# Patient Record
Sex: Male | Born: 1945 | Race: White | Hispanic: No | Marital: Single | Smoking: Former smoker
Health system: Southern US, Community
[De-identification: ages and names within clinical notes are randomized; demographics above are authoritative.]

## PROBLEM LIST (undated history)

## (undated) DIAGNOSIS — G629 Polyneuropathy, unspecified: Secondary | ICD-10-CM

## (undated) DIAGNOSIS — F329 Major depressive disorder, single episode, unspecified: Secondary | ICD-10-CM

## (undated) DIAGNOSIS — R2689 Other abnormalities of gait and mobility: Secondary | ICD-10-CM

## (undated) DIAGNOSIS — G8929 Other chronic pain: Secondary | ICD-10-CM

## (undated) DIAGNOSIS — M1990 Unspecified osteoarthritis, unspecified site: Secondary | ICD-10-CM

## (undated) DIAGNOSIS — I509 Heart failure, unspecified: Secondary | ICD-10-CM

## (undated) DIAGNOSIS — M545 Low back pain, unspecified: Secondary | ICD-10-CM

## (undated) DIAGNOSIS — T4145XA Adverse effect of unspecified anesthetic, initial encounter: Secondary | ICD-10-CM

## (undated) DIAGNOSIS — T8859XA Other complications of anesthesia, initial encounter: Secondary | ICD-10-CM

## (undated) DIAGNOSIS — I6529 Occlusion and stenosis of unspecified carotid artery: Secondary | ICD-10-CM

## (undated) DIAGNOSIS — F32A Depression, unspecified: Secondary | ICD-10-CM

## (undated) DIAGNOSIS — J449 Chronic obstructive pulmonary disease, unspecified: Secondary | ICD-10-CM

## (undated) DIAGNOSIS — M542 Cervicalgia: Secondary | ICD-10-CM

## (undated) DIAGNOSIS — M549 Dorsalgia, unspecified: Secondary | ICD-10-CM

## (undated) DIAGNOSIS — I10 Essential (primary) hypertension: Secondary | ICD-10-CM

## (undated) HISTORY — PX: NECK SURGERY: SHX720

## (undated) HISTORY — DX: Low back pain: M54.5

## (undated) HISTORY — PX: PILONIDAL CYST / SINUS EXCISION: SUR543

## (undated) HISTORY — PX: CARPAL TUNNEL RELEASE: SHX101

## (undated) HISTORY — PX: ABDOMINAL AORTIC ANEURYSM REPAIR: SUR1152

## (undated) HISTORY — DX: Heart failure, unspecified: I50.9

## (undated) HISTORY — PX: OTHER SURGICAL HISTORY: SHX169

## (undated) HISTORY — DX: Low back pain, unspecified: M54.50

## (undated) HISTORY — DX: Major depressive disorder, single episode, unspecified: F32.9

## (undated) HISTORY — DX: Depression, unspecified: F32.A

## (undated) HISTORY — DX: Unspecified osteoarthritis, unspecified site: M19.90

## (undated) HISTORY — DX: Other abnormalities of gait and mobility: R26.89

## (undated) HISTORY — DX: Chronic obstructive pulmonary disease, unspecified: J44.9

## (undated) HISTORY — DX: Essential (primary) hypertension: I10

## (undated) HISTORY — PX: TONSILLECTOMY: SUR1361

## (undated) HISTORY — DX: Occlusion and stenosis of unspecified carotid artery: I65.29

---

## 2001-05-11 ENCOUNTER — Encounter: Admission: RE | Admit: 2001-05-11 | Discharge: 2001-05-11 | Payer: Self-pay | Admitting: Neurosurgery

## 2001-05-11 ENCOUNTER — Encounter: Payer: Self-pay | Admitting: Neurosurgery

## 2005-01-26 ENCOUNTER — Ambulatory Visit (HOSPITAL_COMMUNITY): Admission: RE | Admit: 2005-01-26 | Discharge: 2005-01-26 | Payer: Self-pay | Admitting: Neurosurgery

## 2005-02-24 ENCOUNTER — Encounter: Admission: RE | Admit: 2005-02-24 | Discharge: 2005-02-24 | Payer: Self-pay | Admitting: Neurosurgery

## 2006-03-29 ENCOUNTER — Ambulatory Visit: Payer: Self-pay | Admitting: General Practice

## 2006-05-26 ENCOUNTER — Ambulatory Visit: Payer: Self-pay | Admitting: General Practice

## 2006-09-27 IMAGING — CR DG CERVICAL SPINE FLEX&EXT ONLY
4 series · 4 of 4 positions shown · non-contrast
Comparison: none

CLINICAL DATA: Cord compression, neck pain, left arm weakness.  
CERVICAL SPINE WITH FLEXION AND EXTENSION ? FOUR VIEW:   
Loss of normal lordotic curvature with kyphotic angulation centered at C3 level.  Marked disk space narrowing and mild to moderate osteophytic formation at C3-4 and C4-5.  Less impressive disk space narrowing at C2-3.  Anterior subluxation C2 on C3 by approximately 3.5-4.0 mm which does not appear to change significantly with flexion and extension.  Minimal anterior subluxation C5 on C6 by 2.0-2.5 mm.  Minimal retrolisthesis C4 on C5 by a couple of millimeters.  
Tip of the odontoid process and the anterior arch of C1 are somewhat difficult to optimally visualize.  It is possible there may be hypoplasia of the posterior arch of C1.  Odontoid process appears somewhat high riding.  
Rather capacious-appearing central canal caliber mainly in the lower cervical region.  MRI would be helpful for further assessment in this case.

[w c-spine lat *]
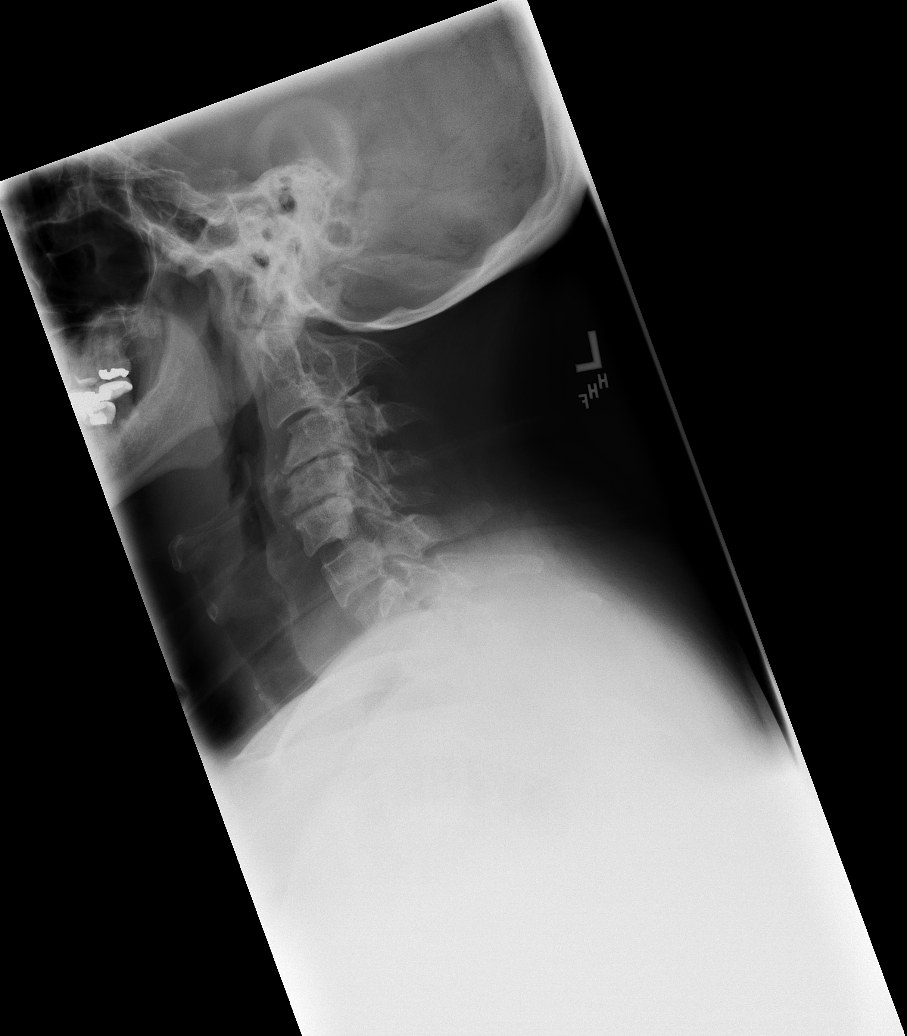

[w c-spine flexion *]
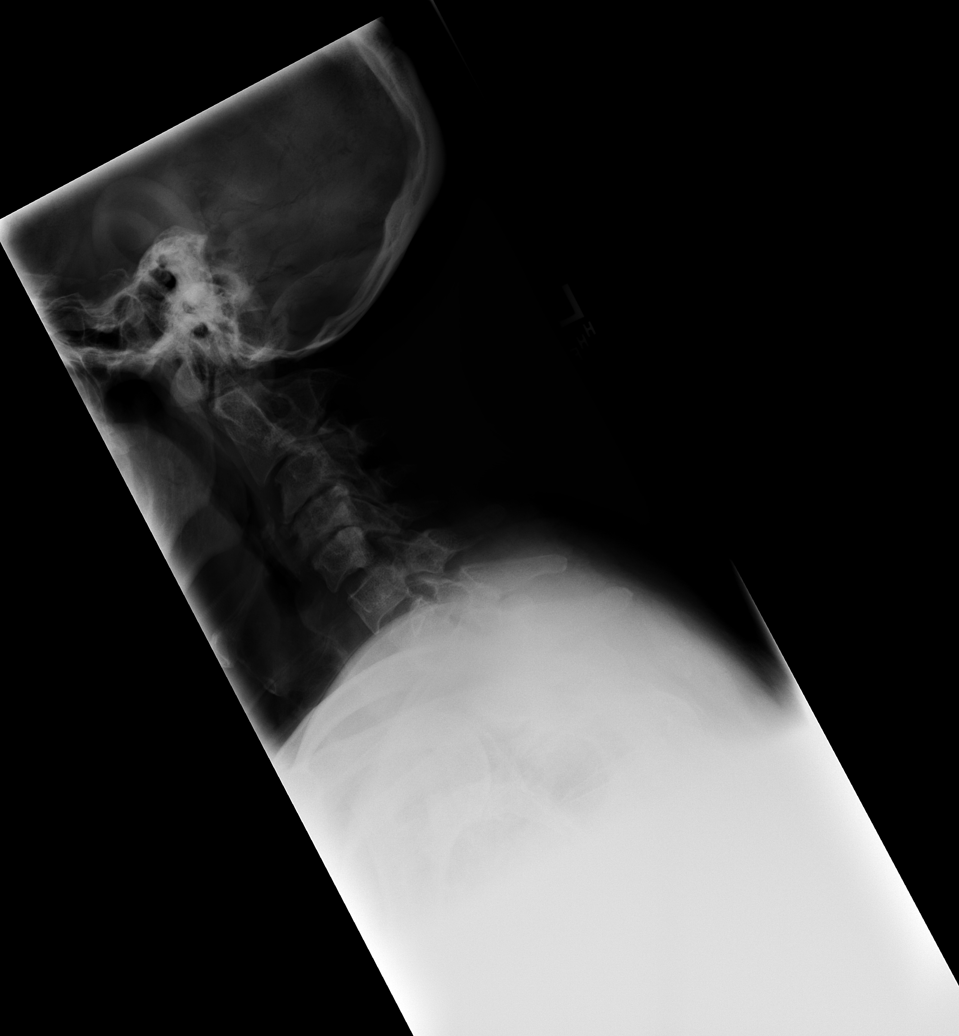

[w c-spine extension]
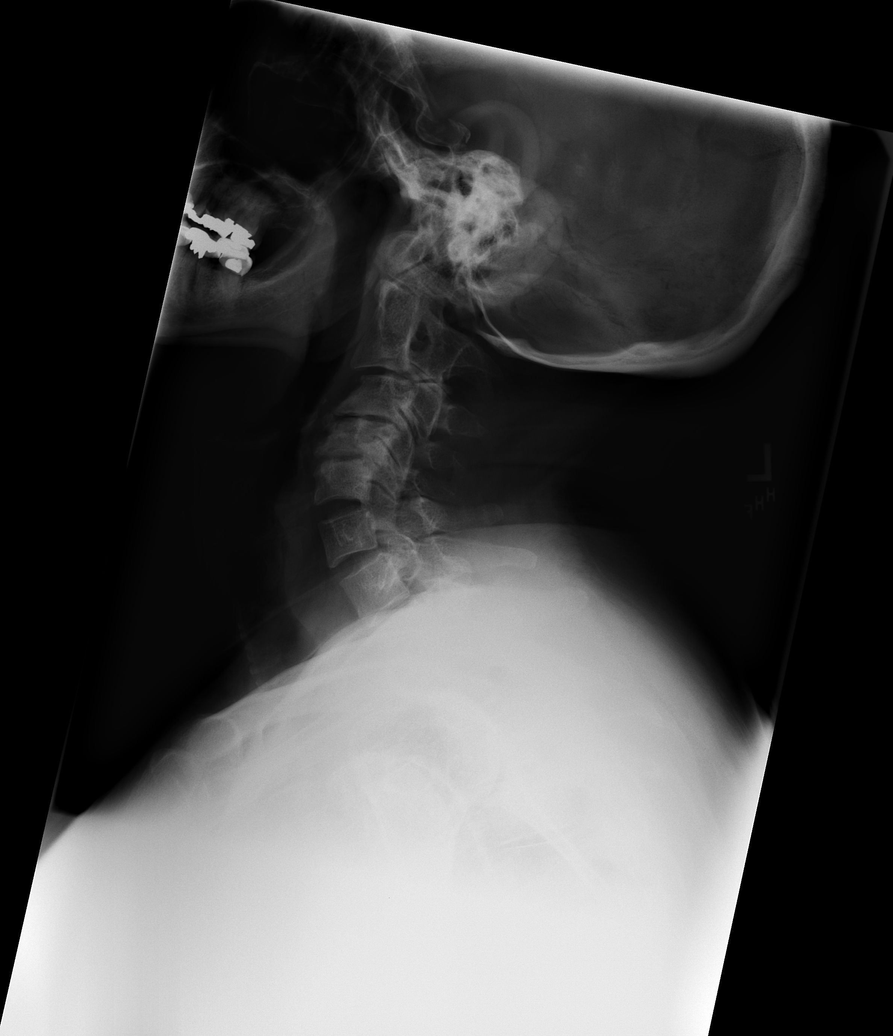

[w swimmers view *]
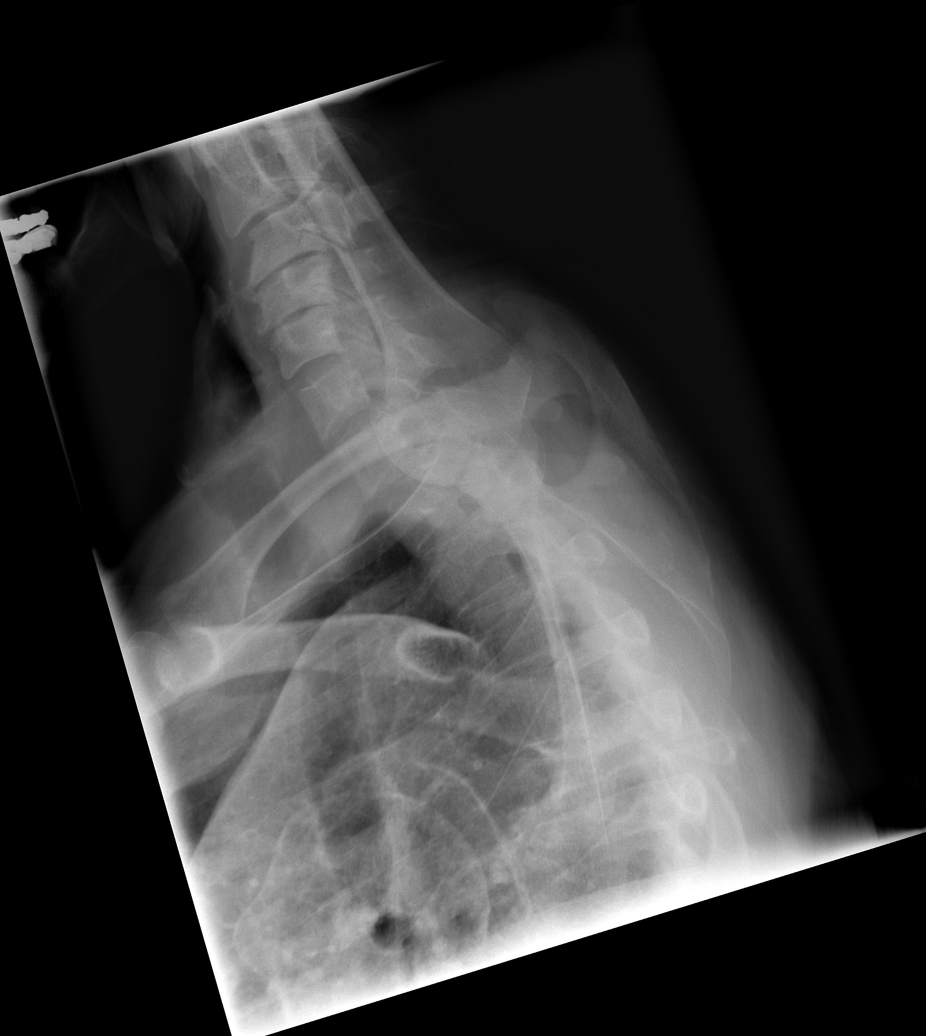

[4 of 4 positions shown; findings below may reference images not displayed]

IMPRESSION: Cervical kyphosis. Multiple sites of mild subluxation most obvious at C2-3 without significant change with flexion and extension however, the range of motion is limited.  Question C1-2 anomaly.    Consider MRI of the cervical spine for evaluation of findings described above.

## 2006-10-26 IMAGING — MR MR CERVICAL SPINE W/O CM
4 of 6 series · 30 of 48 positions shown · IV contrast (agent unspecified)
Comparison: none

CLINICAL DATA: Right arm weakness.  Right hand numbness.  Assess for stenosis.
 MRI CERVICAL SPINE WITHOUT CONTRAST:
TECHNIQUE: Multiplanar and multiecho pulse sequences of the cervical spine, to include the base of the skull and upper thoracic region, were obtained according to standard protocol without IV contrast.

[Series 8: t2_me2d_tra · axial · 4.0mm · 0.41mm/px · z∈[-83,+24]mm · 9 of 20 slices shown]
[im 1/20]
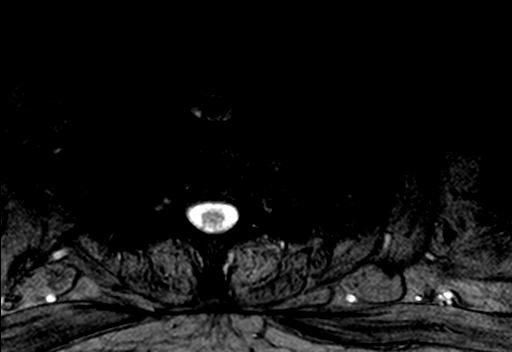
[im 4/20]
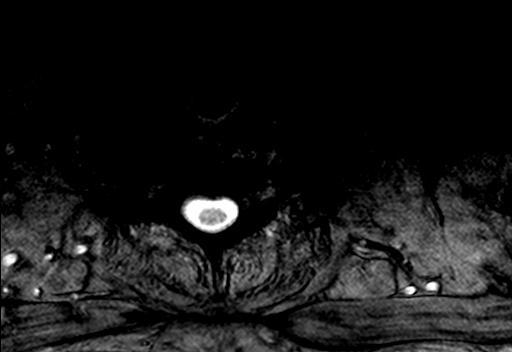
[im 6/20]
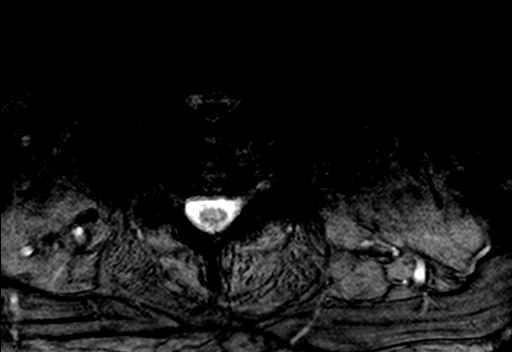
[im 9/20]
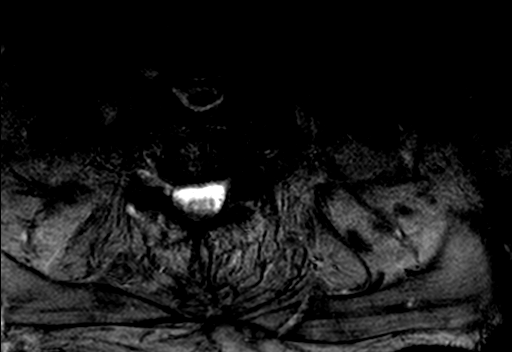
[im 11/20]
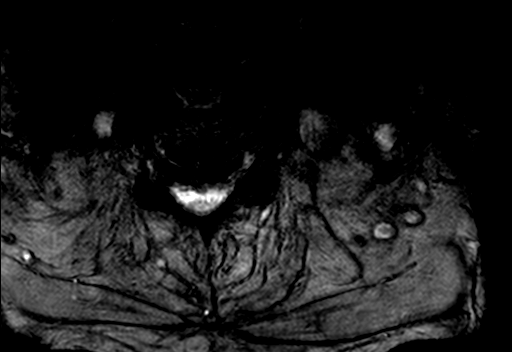
[im 14/20]
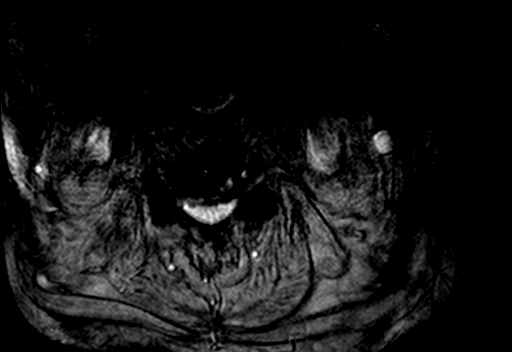
[im 16/20]
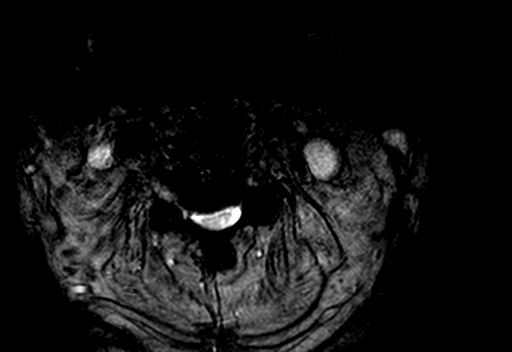
[im 18/20]
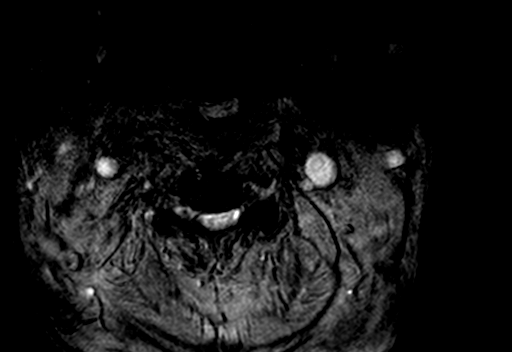
[im 20/20]
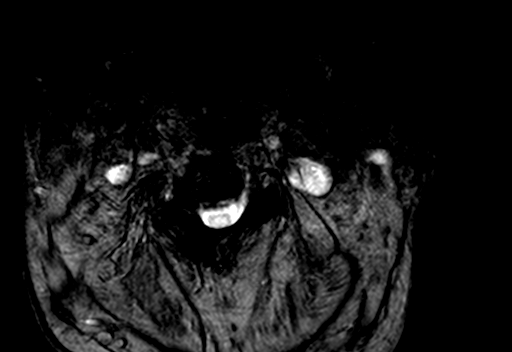

[Series 9: t1_tse_sag_s4_(id) · sagittal · 3.0mm · 0.43mm/px · 7 of 11 slices shown]
[im 1/11]
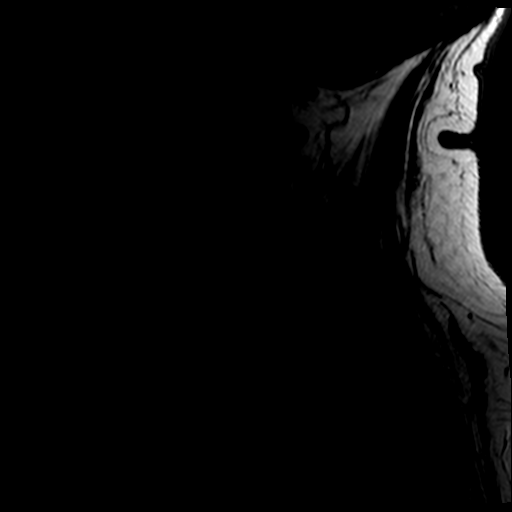
[im 2/11]
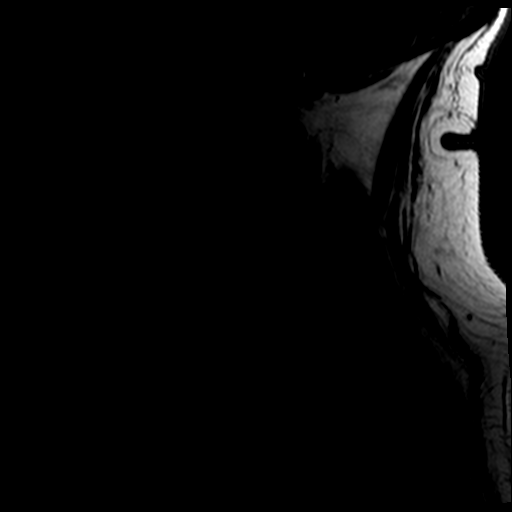
[im 4/11]
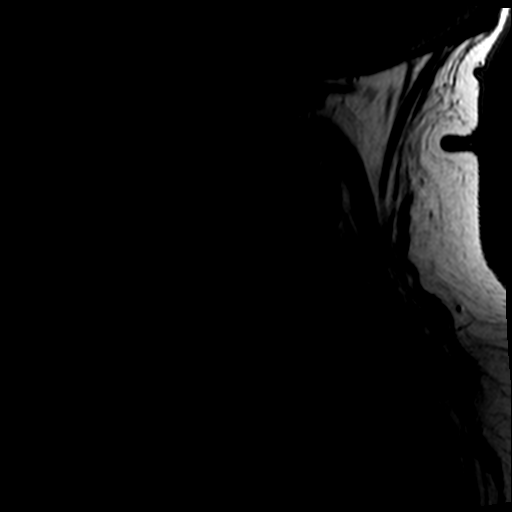
[im 6/11]
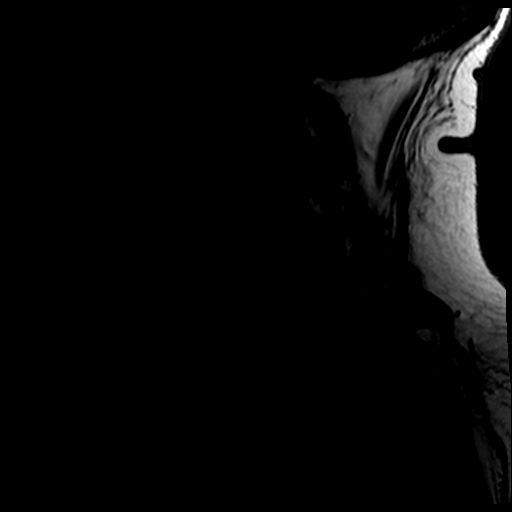
[im 7/11]
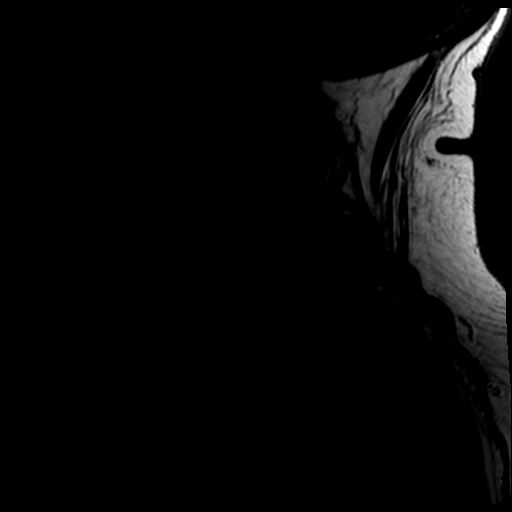
[im 9/11]
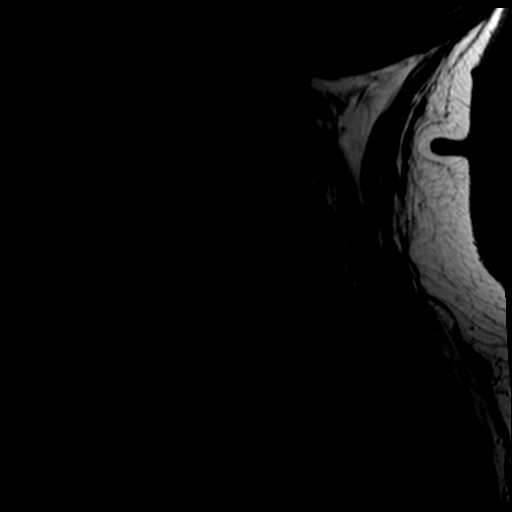
[im 11/11]
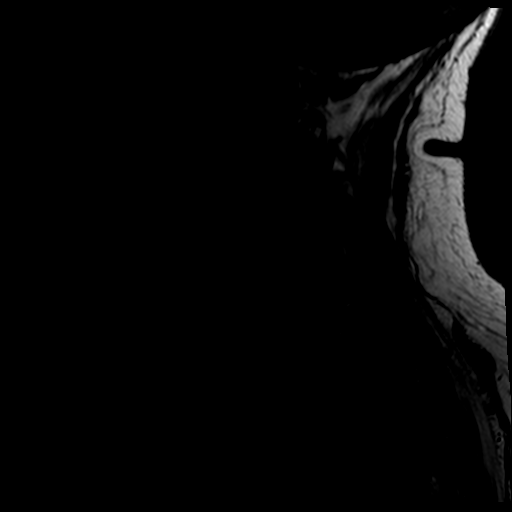

[Series 10: t2_tse_rst_sag_s5_(id) · sagittal · 3.0mm · 0.43mm/px · 3 of 11 slices shown]
[im 3/11]
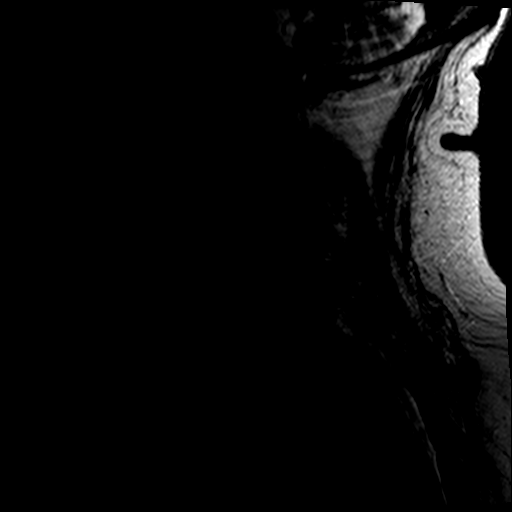
[im 7/11]
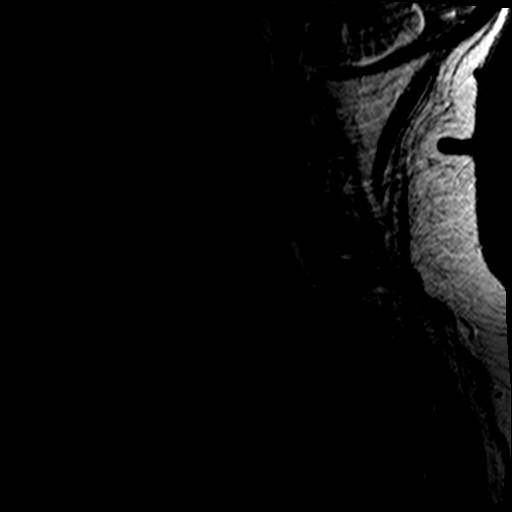
[im 11/11]
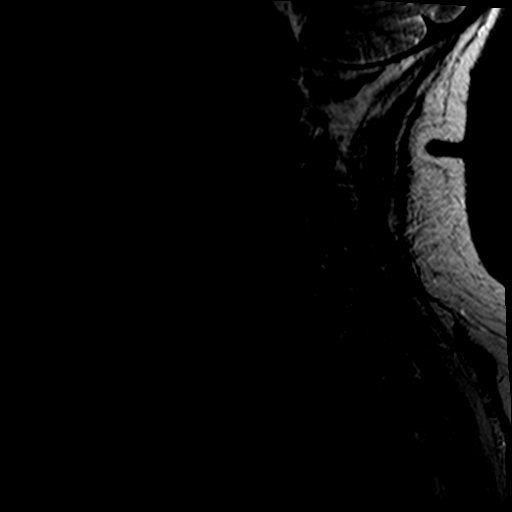

[Series 13: T2 · axial · 4.0mm · 0.82mm/px · z∈[-83,+24]mm · 11 of 20 slices shown]
[im 1/20]
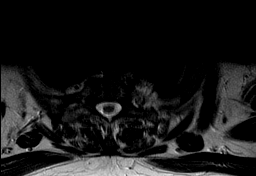
[im 2/20]
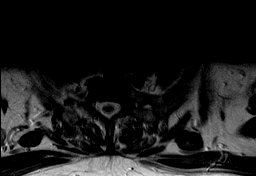
[im 4/20]
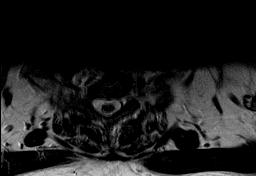
[im 6/20]
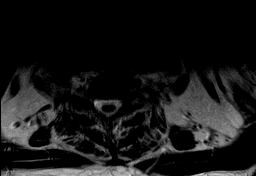
[im 8/20]
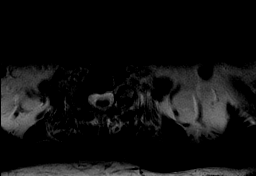
[im 10/20]
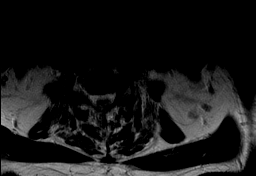
[im 12/20]
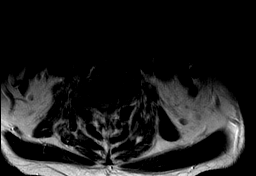
[im 14/20]
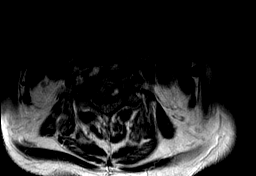
[im 16/20]
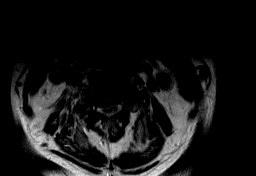
[im 18/20]
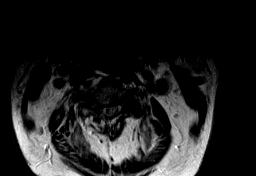
[im 20/20]
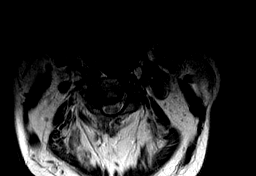

[30 of 48 positions shown; findings below may reference images not displayed]

FINDINGS: The study is degraded as the patient was too large for the standard neck coil.  This is our third attempt to image the patient.  Breathing motion degrades the exam considerably.  The findings are as follows:
 The patient has kyphotic change in the cervical spine from C2 to C6 with anterolisthesis of C2 on C3 of 6 mm.  Because of the kyphotic change and anterolisthesis, in combination with hypertrophic spondylosis at the C2-3, C3-4 and C4-5 levels, there is severe stenosis of the spinal canal with a markedly narrowed AC diameter of the spinal canal, measuring a small as 5 mm at the C2-3 level.  The subarachnoid space surrounding the cord is effaced and the cord is flattened and shows abnormal T2 signal in that region.  At C5-6, C6-7 and C7-T1, there is wide patency of the canal.  
 I think there is neural foraminal stenosis on the right at C2-3, bilaterally at C3- 4 and C4-5.
IMPRESSION: Severe spinal stenosis at C2-3, C3-4 and C4-5 because of kyphotic deformity, anterolisthesis of 6 mm at the C2-3 level and hypertrophic spondylosis with endplate osteophytes covering protruding disk material at C3-4 and C4-5.  The spinal canal is markedly narrowed in that region measuring as small as 5 mm.  The subarachnoid space surrounding the cord is effaced and the cord is flattened and there is abnormal T2 signal in the cord.

## 2007-05-31 ENCOUNTER — Encounter: Admission: RE | Admit: 2007-05-31 | Discharge: 2007-05-31 | Payer: Self-pay | Admitting: Neurosurgery

## 2007-06-19 ENCOUNTER — Ambulatory Visit: Payer: Self-pay | Admitting: Surgery

## 2007-06-22 HISTORY — PX: ABDOMINAL AORTIC ANEURYSM REPAIR: SUR1152

## 2007-07-03 ENCOUNTER — Encounter: Admission: RE | Admit: 2007-07-03 | Discharge: 2007-07-03 | Payer: Self-pay | Admitting: Surgery

## 2007-07-03 ENCOUNTER — Ambulatory Visit: Payer: Self-pay | Admitting: Surgery

## 2007-09-18 ENCOUNTER — Encounter: Admission: RE | Admit: 2007-09-18 | Discharge: 2007-09-18 | Payer: Self-pay | Admitting: Surgery

## 2007-09-18 ENCOUNTER — Ambulatory Visit: Payer: Self-pay | Admitting: Surgery

## 2007-10-11 ENCOUNTER — Inpatient Hospital Stay (HOSPITAL_COMMUNITY): Admission: RE | Admit: 2007-10-11 | Discharge: 2007-10-12 | Payer: Self-pay | Admitting: Surgery

## 2007-10-11 ENCOUNTER — Ambulatory Visit: Payer: Self-pay | Admitting: Surgery

## 2007-11-06 ENCOUNTER — Encounter: Admission: RE | Admit: 2007-11-06 | Discharge: 2007-11-06 | Payer: Self-pay | Admitting: Surgery

## 2007-11-06 ENCOUNTER — Ambulatory Visit: Payer: Self-pay | Admitting: Surgery

## 2008-01-22 ENCOUNTER — Ambulatory Visit: Payer: Self-pay | Admitting: Surgery

## 2008-04-29 ENCOUNTER — Ambulatory Visit: Payer: Self-pay | Admitting: Surgery

## 2008-04-29 ENCOUNTER — Encounter: Admission: RE | Admit: 2008-04-29 | Discharge: 2008-04-29 | Payer: Self-pay | Admitting: Surgery

## 2008-07-30 ENCOUNTER — Ambulatory Visit: Payer: Self-pay | Admitting: Surgery

## 2008-10-28 ENCOUNTER — Encounter: Admission: RE | Admit: 2008-10-28 | Discharge: 2008-10-28 | Payer: Self-pay | Admitting: Surgery

## 2008-10-28 ENCOUNTER — Ambulatory Visit: Payer: Self-pay | Admitting: Surgery

## 2008-10-31 ENCOUNTER — Ambulatory Visit: Payer: Self-pay | Admitting: General Practice

## 2008-10-31 ENCOUNTER — Ambulatory Visit: Payer: Self-pay | Admitting: Cardiology

## 2008-11-07 ENCOUNTER — Ambulatory Visit: Payer: Self-pay | Admitting: General Practice

## 2009-01-07 ENCOUNTER — Ambulatory Visit: Payer: Self-pay | Admitting: General Practice

## 2009-01-29 IMAGING — MR MR LUMBAR SPINE W/O CM
4 of 6 series · 18 of 48 positions shown · IV contrast (agent unspecified)
Comparison: none

CLINICAL DATA: 61-year-old male, back pain, lumbar spondylosis. Pain in the left upper leg for 6 months.  Chronic low back pain.
 MRI LUMBAR SPINE WITHOUT CONTRAST:
TECHNIQUE: Multiplanar and multiecho pulse sequences of the lumbar spine, to include the lower thoracic and upper sacral regions, were obtained according to standard protocol without IV contrast.

[Series 3: T2 · sagittal · 4.0mm · 0.44mm/px · 4 of 11 slices shown (1 of 2)]
[im 1/11]
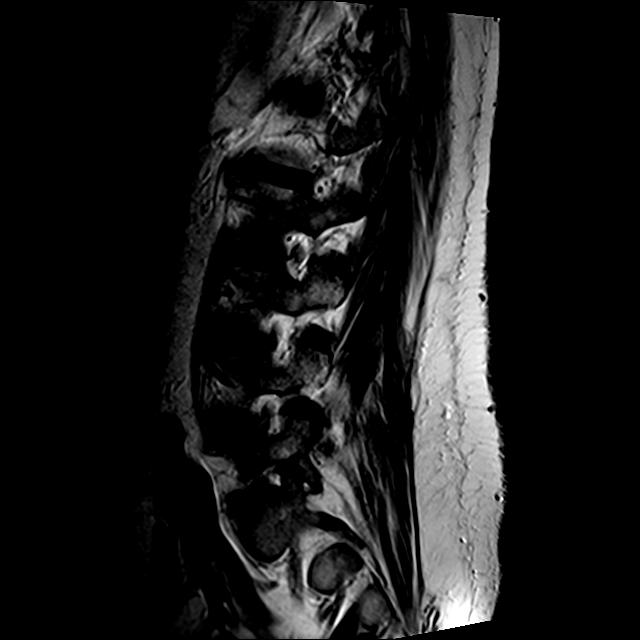
[im 4/11]
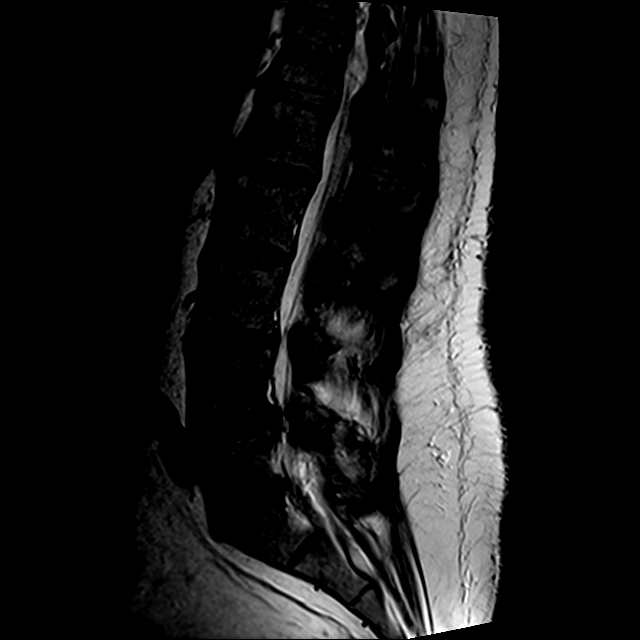
[im 7/11]
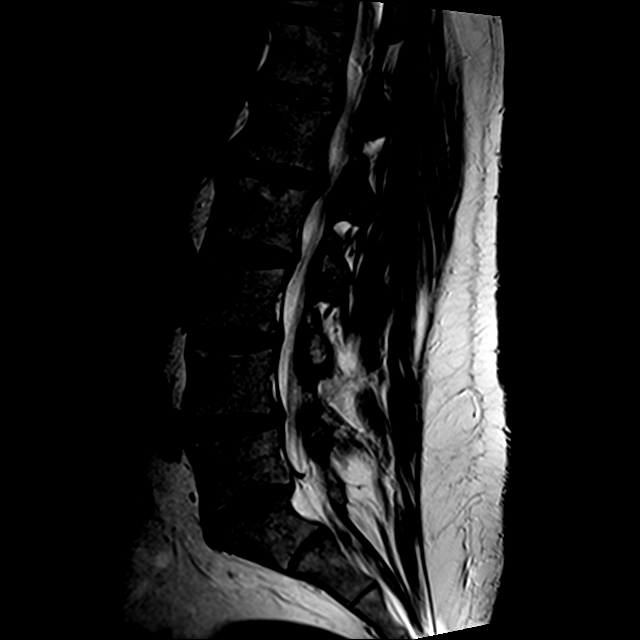
[im 11/11]
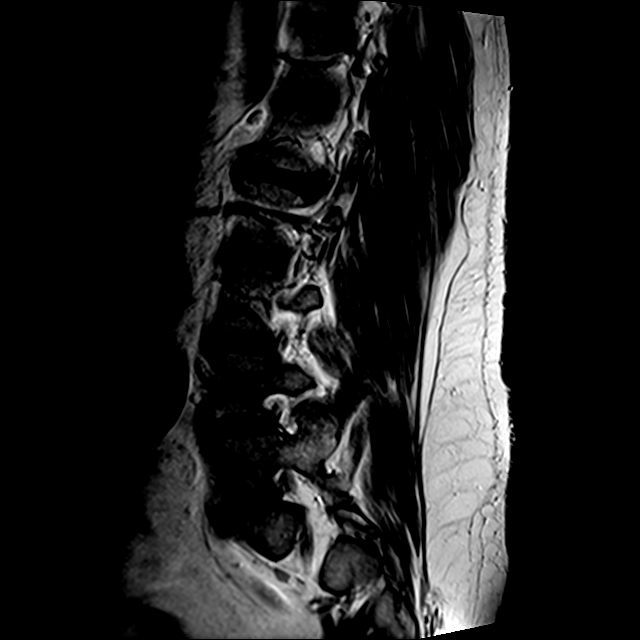

[Series 4: T1 · sagittal · 4.0mm · 0.88mm/px · 3 of 11 slices shown (1 of 2)]
[im 1/11]
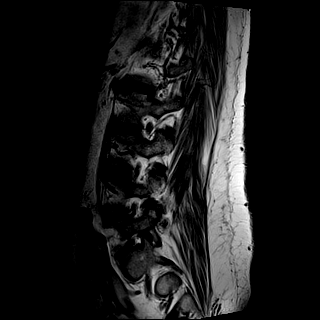
[im 7/11]
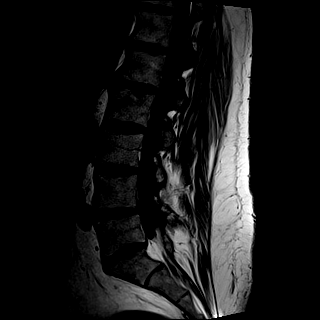
[im 11/11]
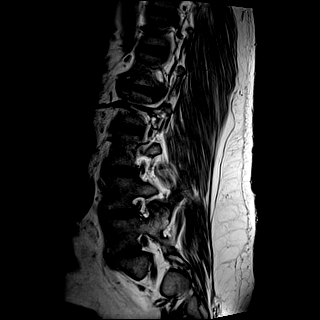

[Series 7: T1 · axial · 4.0mm · 0.51mm/px · z∈[-103,+73]mm · 3 of 36 slices shown (2 of 2)]
[im 6/36]
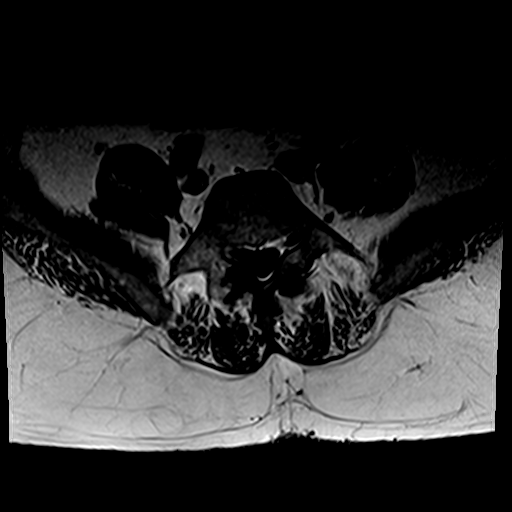
[im 18/36]
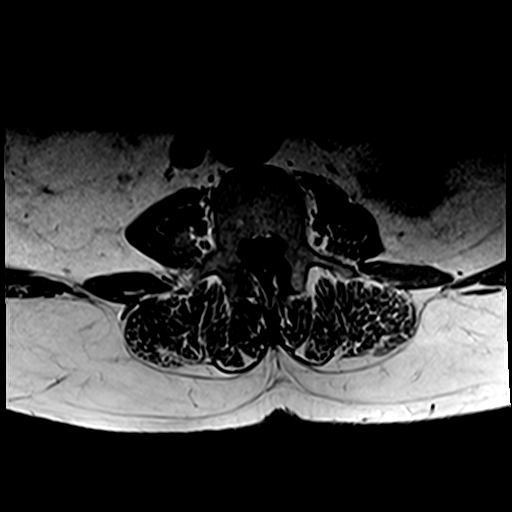
[im 31/36]
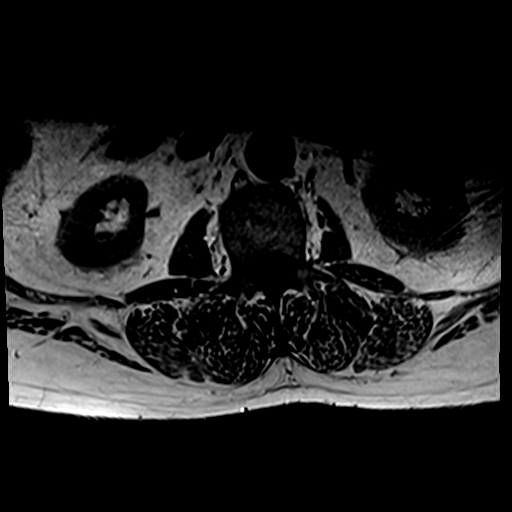

[Series 8: T2 · axial · 4.0mm · 0.51mm/px · z∈[-128,+73]mm · 8 of 36 slices shown (2 of 2)]
[im 1/36]
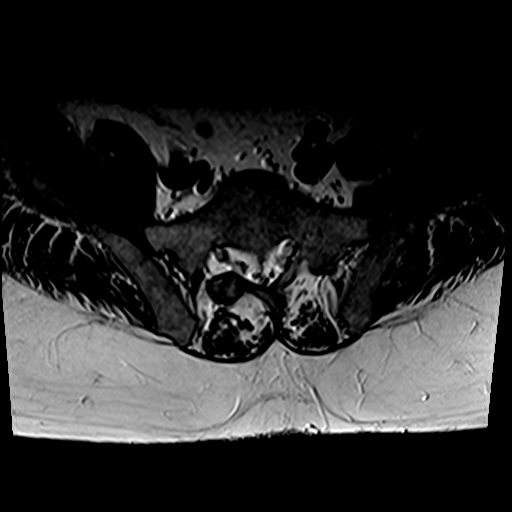
[im 6/36]
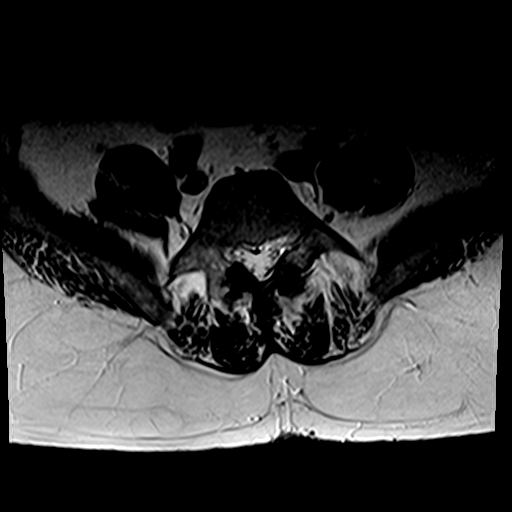
[im 11/36]
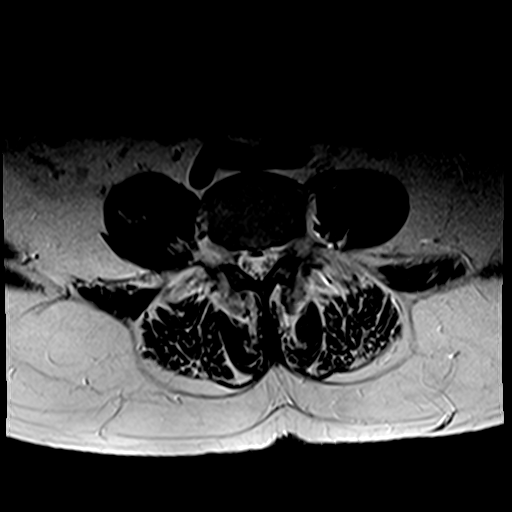
[im 16/36]
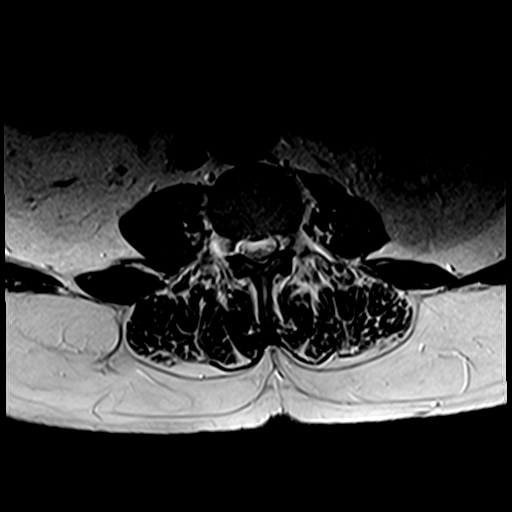
[im 18/36]
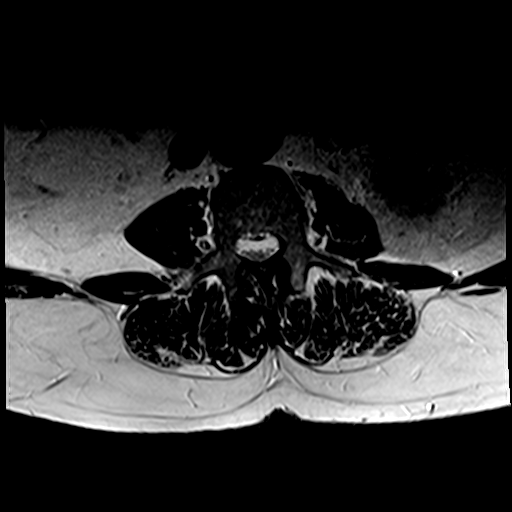
[im 21/36]
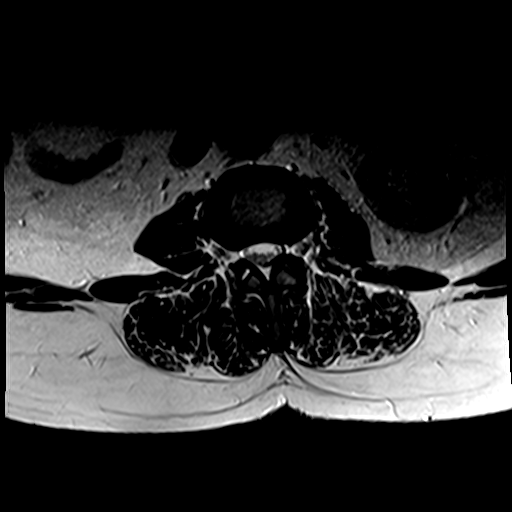
[im 26/36]
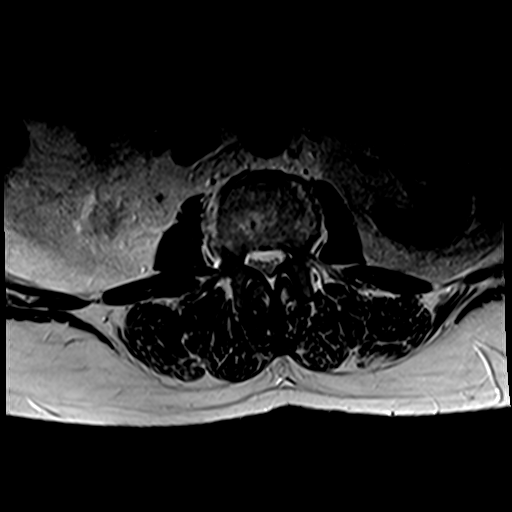
[im 31/36]
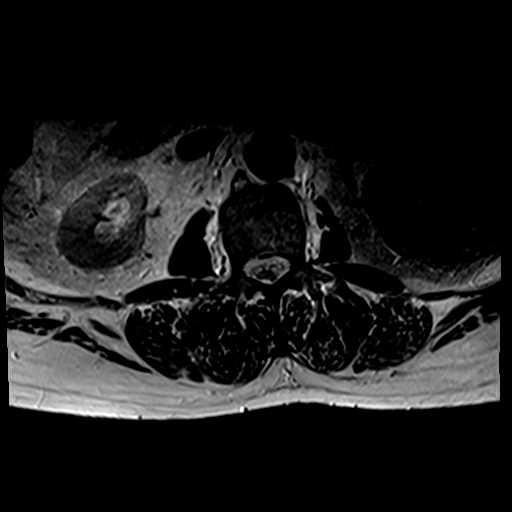

[18 of 48 positions shown; findings below may reference images not displayed]

FINDINGS: Normal signal is present in the conus medullaris, which terminates a L1.  Vertebral body heights and alignment are maintained.  There is a superior end plate Schmorl?s node at L2.  There is some bone edema surrounding the Schmorl?s node.  There is no significant collapse of the vertebral body height.  Abdominal aortic aneurysm is noted.  This is essentially at the level of L3.  Aneurysm is incompletely evaluated but appears to be over 4 cm in maximal diameter and may be approaching 5 cm.  There is circumferential mural wall thickening.  There may be narrowing of the renal arteries.
 Individual disc levels are as follows:
 T12-L1:  Negative.
 L1-2:  There is diffuse desiccation of the disc.  Broad-based disc bulging is noted.  The disc bulges more right than left with mild right foramina.
 L2-3:  Mild facet hypertrophy is present.  There is no significant stenosis.
 L3-4:  Mild disc bulging is present.  There is moderate facet hypertrophy.  The combination results in some narrowing of the lateral recess, left greater than right.  The foramina are patent.
 L4-5:  There is disc desiccation.  A broad-based disc bulge is present.  There appears to be a small annular tear.  Moderate facet hypertrophy is also present.  This results in moderate narrowing of the lateral recess and foramina, left greater than right.  
 L5-S1:  Facet spurring is present on the left resulting in mild left foraminal narrowing.  The central canal and right foramen are patent.
IMPRESSION: 1.  Abdominal aortic aneurysm.  This measures at least 4 cm and may be approaching 5 cm in width.  
 2.  Mild right foraminal narrowing L1-2 due to eccentric disc bulge.
 3.  Moderate left foraminal and lateral recess narrowing L4-5 due to disc bulge and facet hypertrophy.
 4.  Mild left greater than right facet hypertrophy.
 5.  Mild left greater than right lateral recess and foraminal narrowing due to disc bulging and facet hypertrophy.
 6.  Mild left foraminal narrowing L5-S1 due to facet spurring.
 The findings were called to Dr. Danilo Braverman at [DATE] 04/02/07.

## 2009-01-29 IMAGING — CR DG LUMBAR SPINE COMPLETE 4+V
4 series · 4 of 4 positions shown · non-contrast
Comparison: none

CLINICAL DATA: Lumbar spondylosis.  Low back pain radiating to left leg.
 LUMBAR SPINE ? 4 VIEWS:

[view not recorded (1 of 4)]
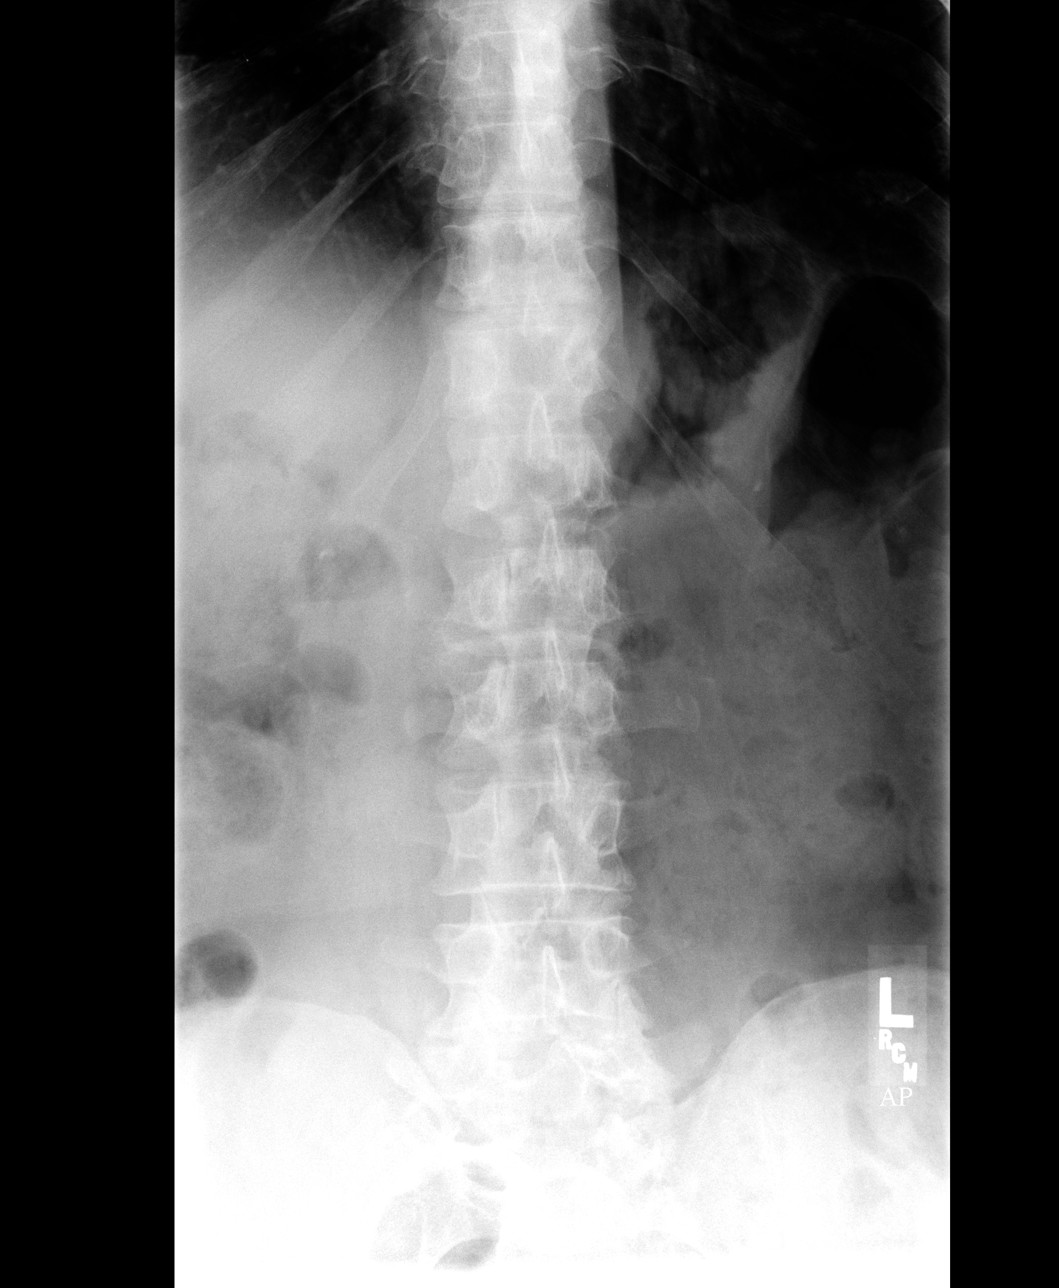

[view not recorded (2 of 4)]
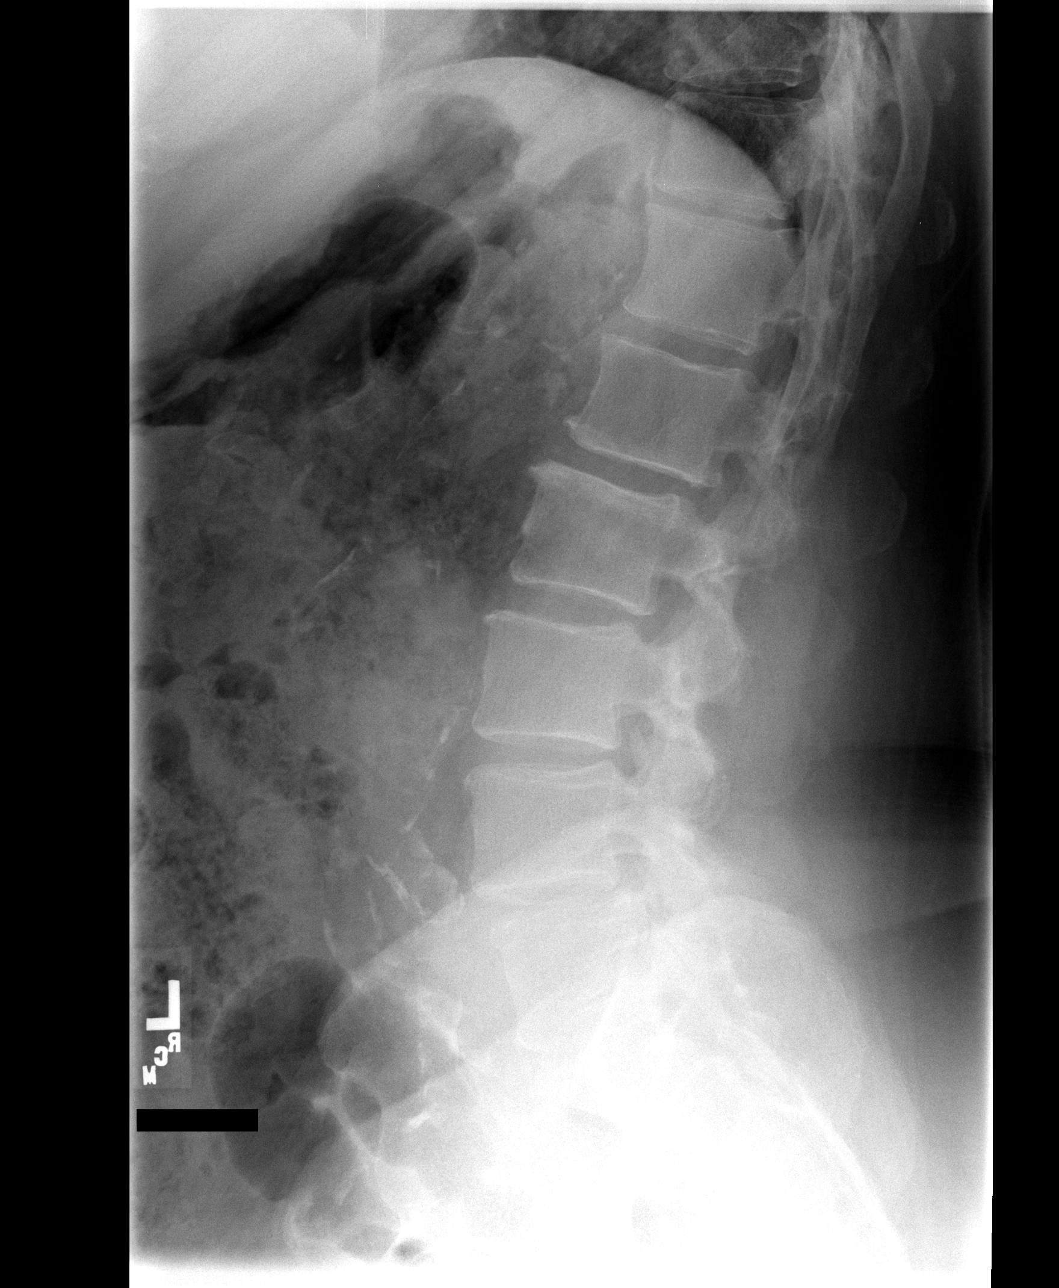

[view not recorded (3 of 4)]
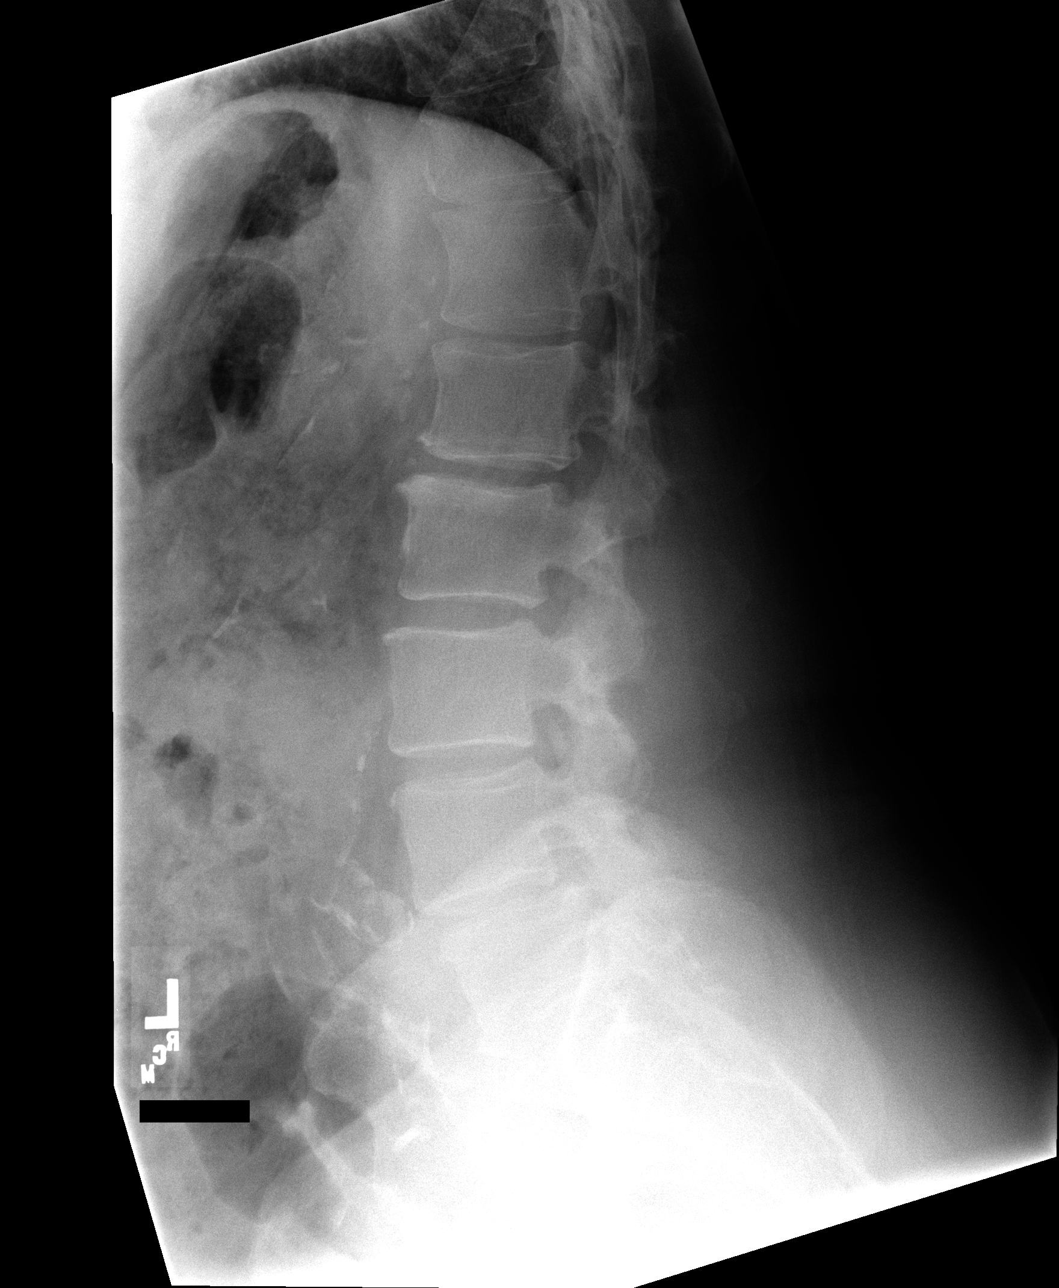

[view not recorded (4 of 4)]
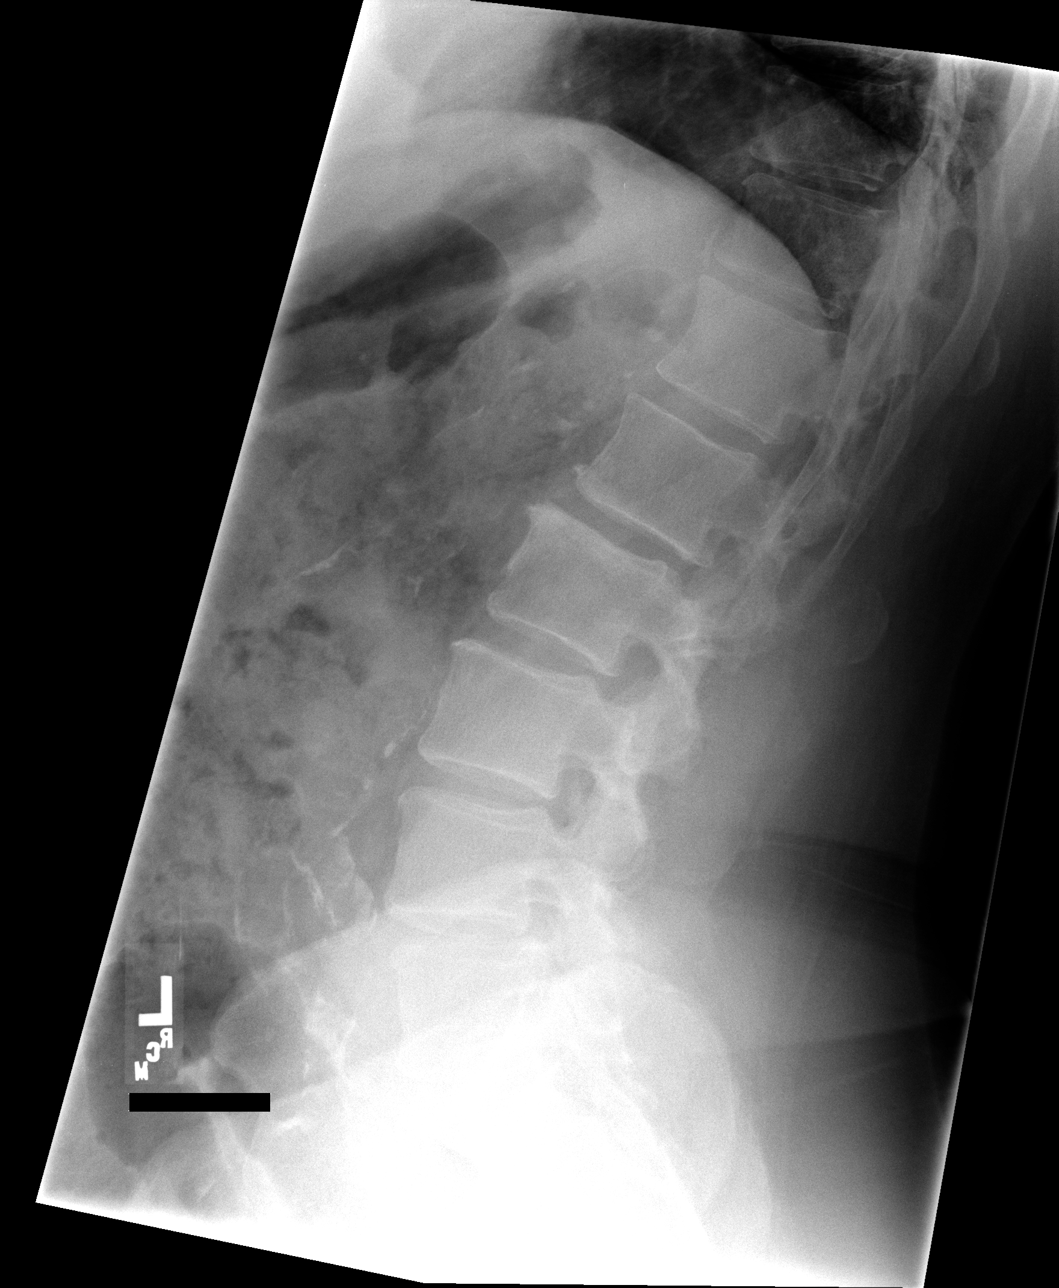

[4 of 4 positions shown; findings below may reference images not displayed]

FINDINGS: No acute compression deformity is seen.  There is minimal anterolisthesis of L4 on L5 of approximately 4 mm.  There does appear to be curvilinear calcification and bulging in the region of the abdominal aorta suggestive of abdominal aortic aneurysm.  Through flexion and extension, there is limited range of  motion with no change in the slight anterolisthesis of L4 on L5.
IMPRESSION: 1.  4  mm anterolisthesis of L4 on L5 remain stable through flexion and extension.  No compression deformity.
 2.  Probable AAA.  Recommend ultrasound or CT to assess further.

## 2009-02-12 ENCOUNTER — Encounter: Admission: RE | Admit: 2009-02-12 | Discharge: 2009-02-12 | Payer: Self-pay | Admitting: Neurosurgery

## 2009-03-03 ENCOUNTER — Encounter: Admission: RE | Admit: 2009-03-03 | Discharge: 2009-03-03 | Payer: Self-pay | Admitting: Neurosurgery

## 2009-03-03 IMAGING — CT CT ANGIO PELVIS
2 of 6 series · 17 of 46 positions shown, 19 images · IV contrast ([ID] OMNI 300)
Comparison: None.

CT ANGIOGRAPHY OF ABDOMEN - AAA PROTOCOL:

CLINICAL DATA: Abdominal Aortic Aneurysm.  Prestent evaluation.
TECHNIQUE: Multidetector CT imaging of the abdomen and pelvis was performed
before and during bolus injection of intravenous contrast.  Multiplanar CT
angiographic image reconstructions were also generated to evaluate the vascular
anatomy.

Contrast:  150 cc Omnipaque 300

[Series 4: angio · axial · 0.74mm/px · z∈[-490,-25]mm · 14 of 208 slices shown, 16 images]
[im 11/208  soft-tissue]
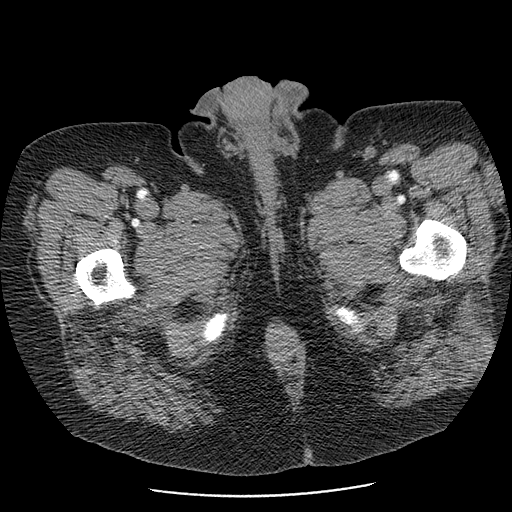
[im 11/208  bone]
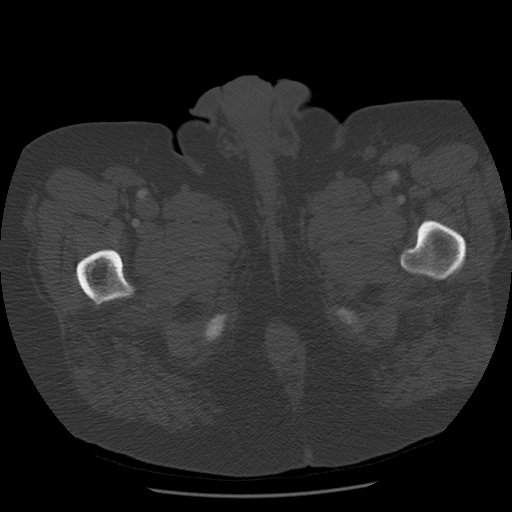
[im 22/208  soft-tissue]
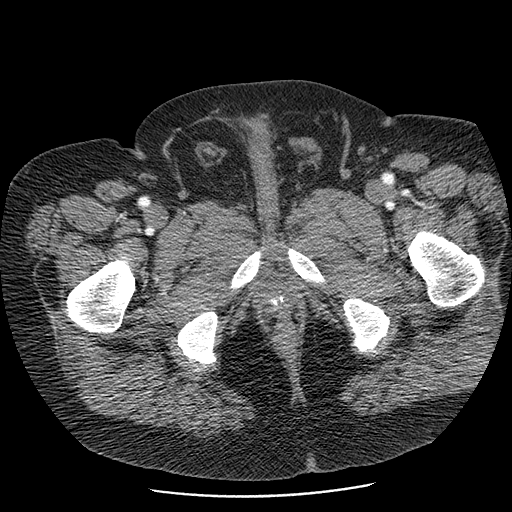
[im 44/208  soft-tissue]
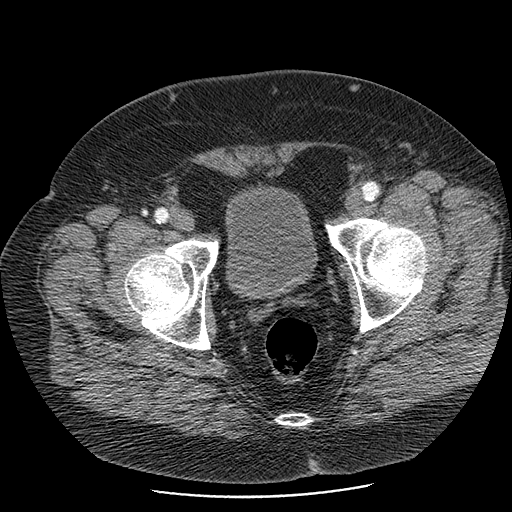
[im 55/208  soft-tissue]
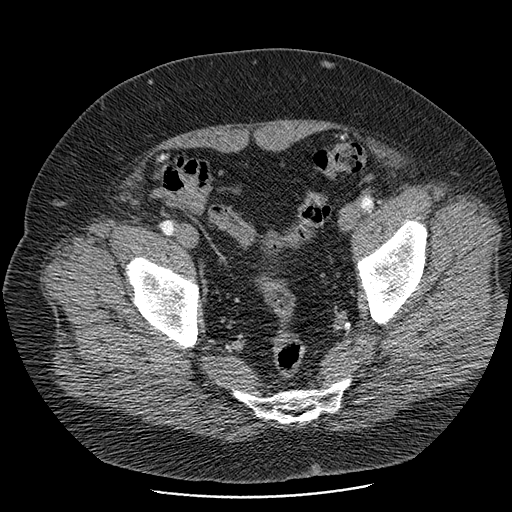
[im 66/208  soft-tissue]
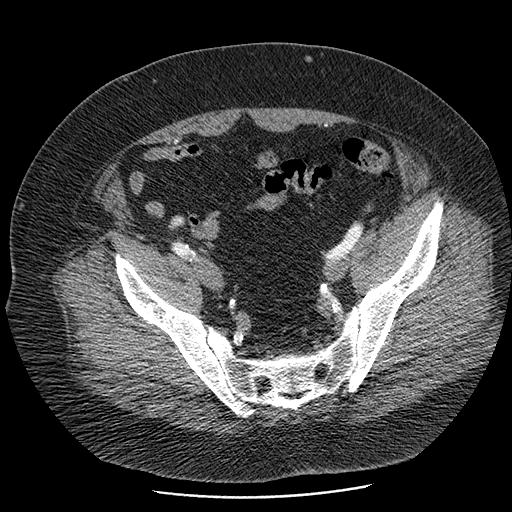
[im 88/208  soft-tissue]
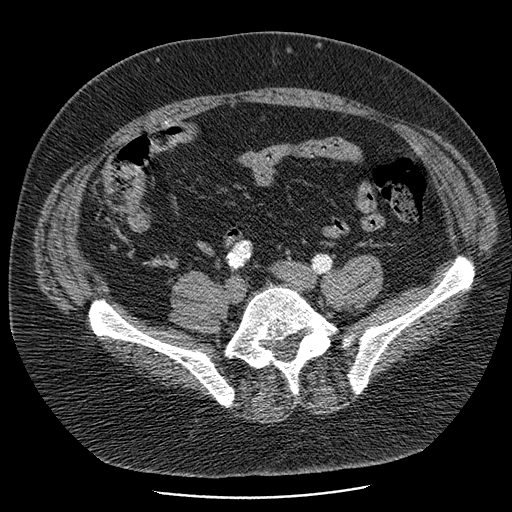
[im 99/208  soft-tissue]
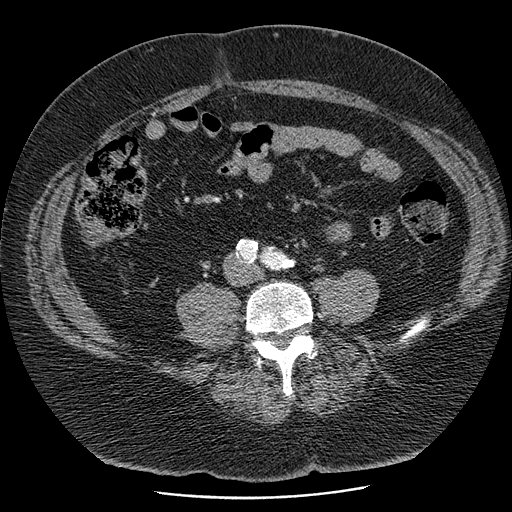
[im 109/208  soft-tissue]
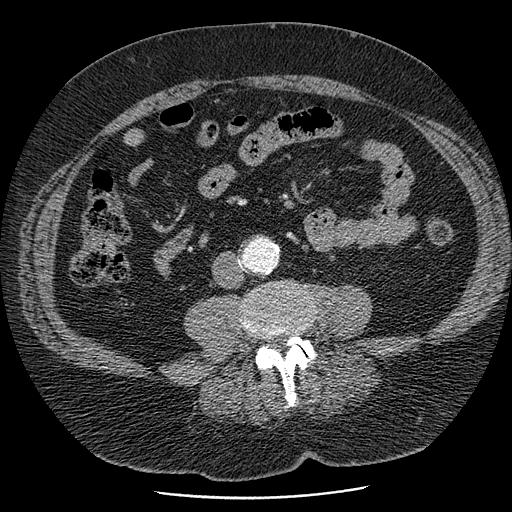
[im 120/208  soft-tissue]
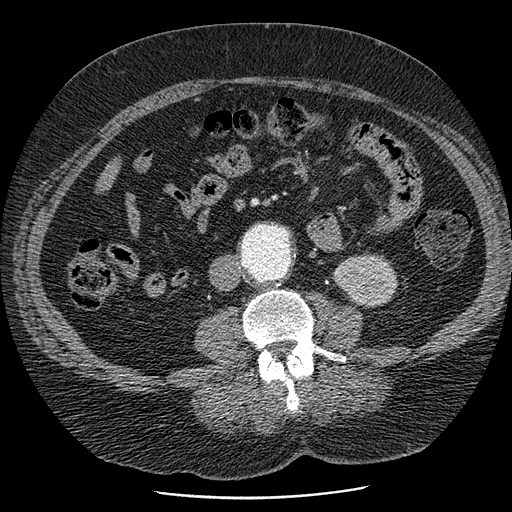
[im 120/208  bone]
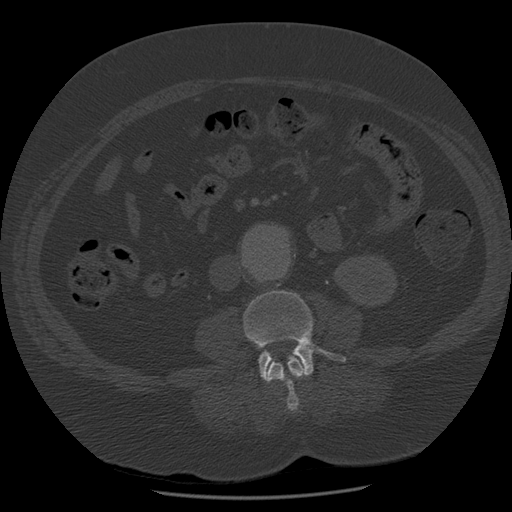
[im 142/208  soft-tissue]
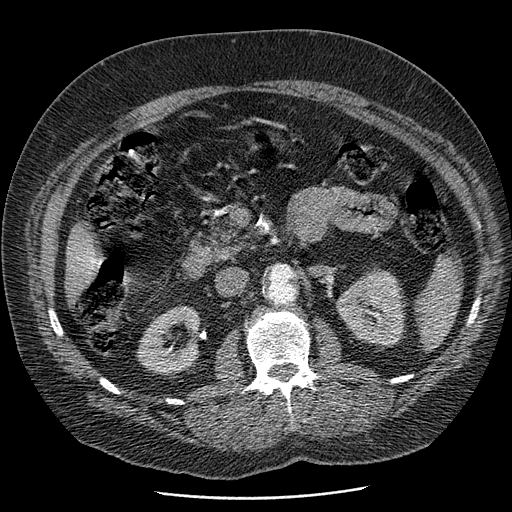
[im 153/208  soft-tissue]
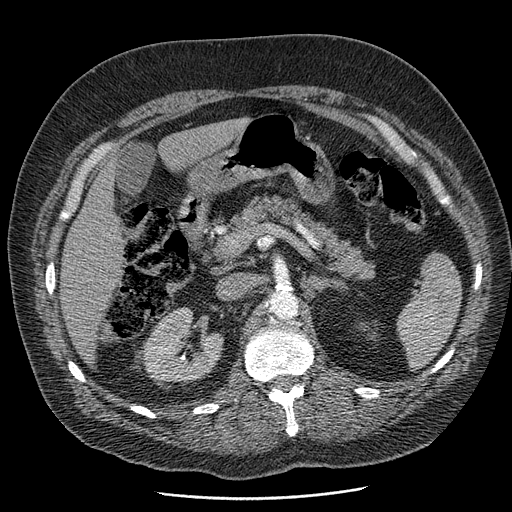
[im 164/208  soft-tissue]
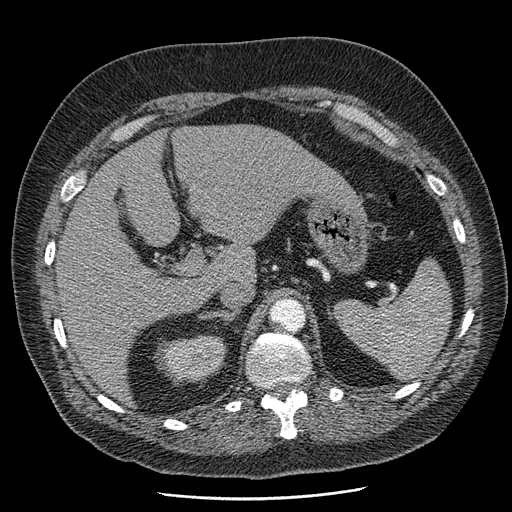
[im 186/208  soft-tissue]
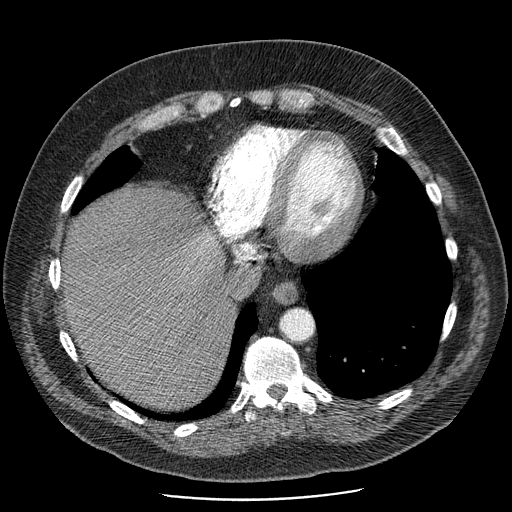
[im 197/208  soft-tissue]
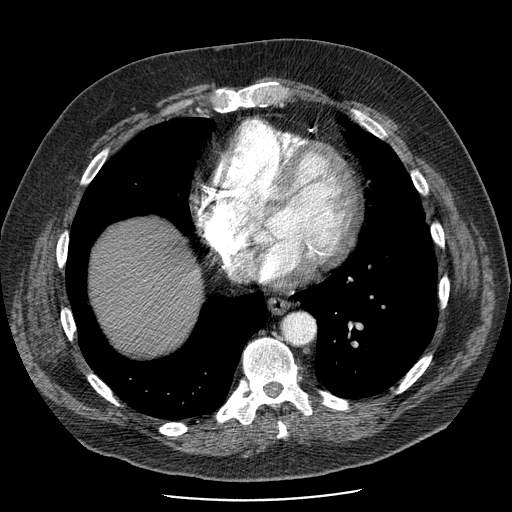

[Series 500: cor body · coronal · 1.04mm/px · 3 of 130 slices shown]
[im 44/130  soft-tissue]
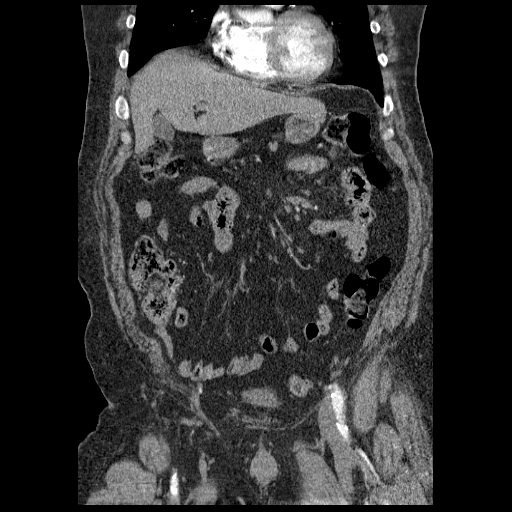
[im 58/130  soft-tissue]
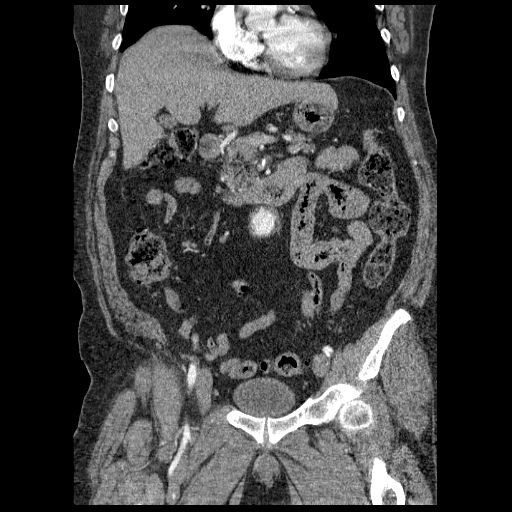
[im 72/130  soft-tissue]
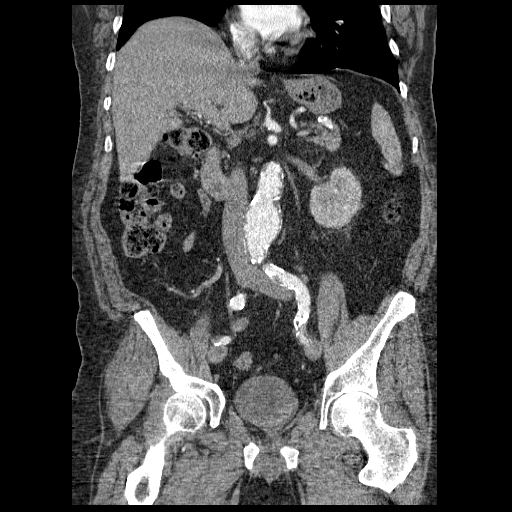

[17 of 46 positions shown; findings below may reference images not displayed]

FINDINGS: Length of infrarenal neck (from lowest renal artery):  5.3 cm
Diameter of infrarenal neck:  2.9 cm
Number of renal arteries:  Right = ONE ; Left = ONE

Total length of aneurysm:  6.1 cm
Greatest aneurysm diameter:  5.2 cm
Aneurysm ends at aortic bifurcation: No 
If no, Distance from aneurysm to bifurcation:  0.5 cm

Greatest common iliac artery diameters (cm):  Right = 1.6 ; Left =
Diameter of common iliac arteries just above iliac bifurcation (cm):  Right =
1.5 ; Left =
Length of common iliac arteries (cm):  Right = 7.4 ;  Left =

Comments: There is no focal abnormality in the liver or spleen. The stomach is
nondistended. The duodenum, pancreas, gallbladder, adrenal glands, and kidneys
have normal imaging features.
IMPRESSION: Abdominal aortic aneurysm as described. 

CT ANGIOGRAPHY OF PELVIS - AAA PROTOCOL:
FINDINGS: Pertinent vascular measurements related to the abdominal aortic aneurysm are
outlined above.

There is no free intraperitoneal fluid. No pelvic lymphadenopathy. Diverticular
changes seen in the sigmoid colon. The terminal ileum is unremarkable.

Bone windows show surgical change in the lower lumbar spine.
IMPRESSION: No acute findings.

## 2009-03-11 ENCOUNTER — Encounter: Payer: Self-pay | Admitting: General Practice

## 2009-03-21 ENCOUNTER — Encounter: Payer: Self-pay | Admitting: General Practice

## 2009-04-01 ENCOUNTER — Encounter: Admission: RE | Admit: 2009-04-01 | Discharge: 2009-04-01 | Payer: Self-pay | Admitting: Neurosurgery

## 2009-04-21 ENCOUNTER — Encounter: Payer: Self-pay | Admitting: General Practice

## 2009-05-05 ENCOUNTER — Ambulatory Visit: Payer: Self-pay | Admitting: Surgery

## 2009-06-09 IMAGING — CR DG CHEST 2V
2 series · 2 of 2 positions shown · non-contrast
Comparison: None

CLINICAL DATA: Pre admit for aortic aneurysm

CHEST - 2 VIEW

[view not recorded (1 of 2)]
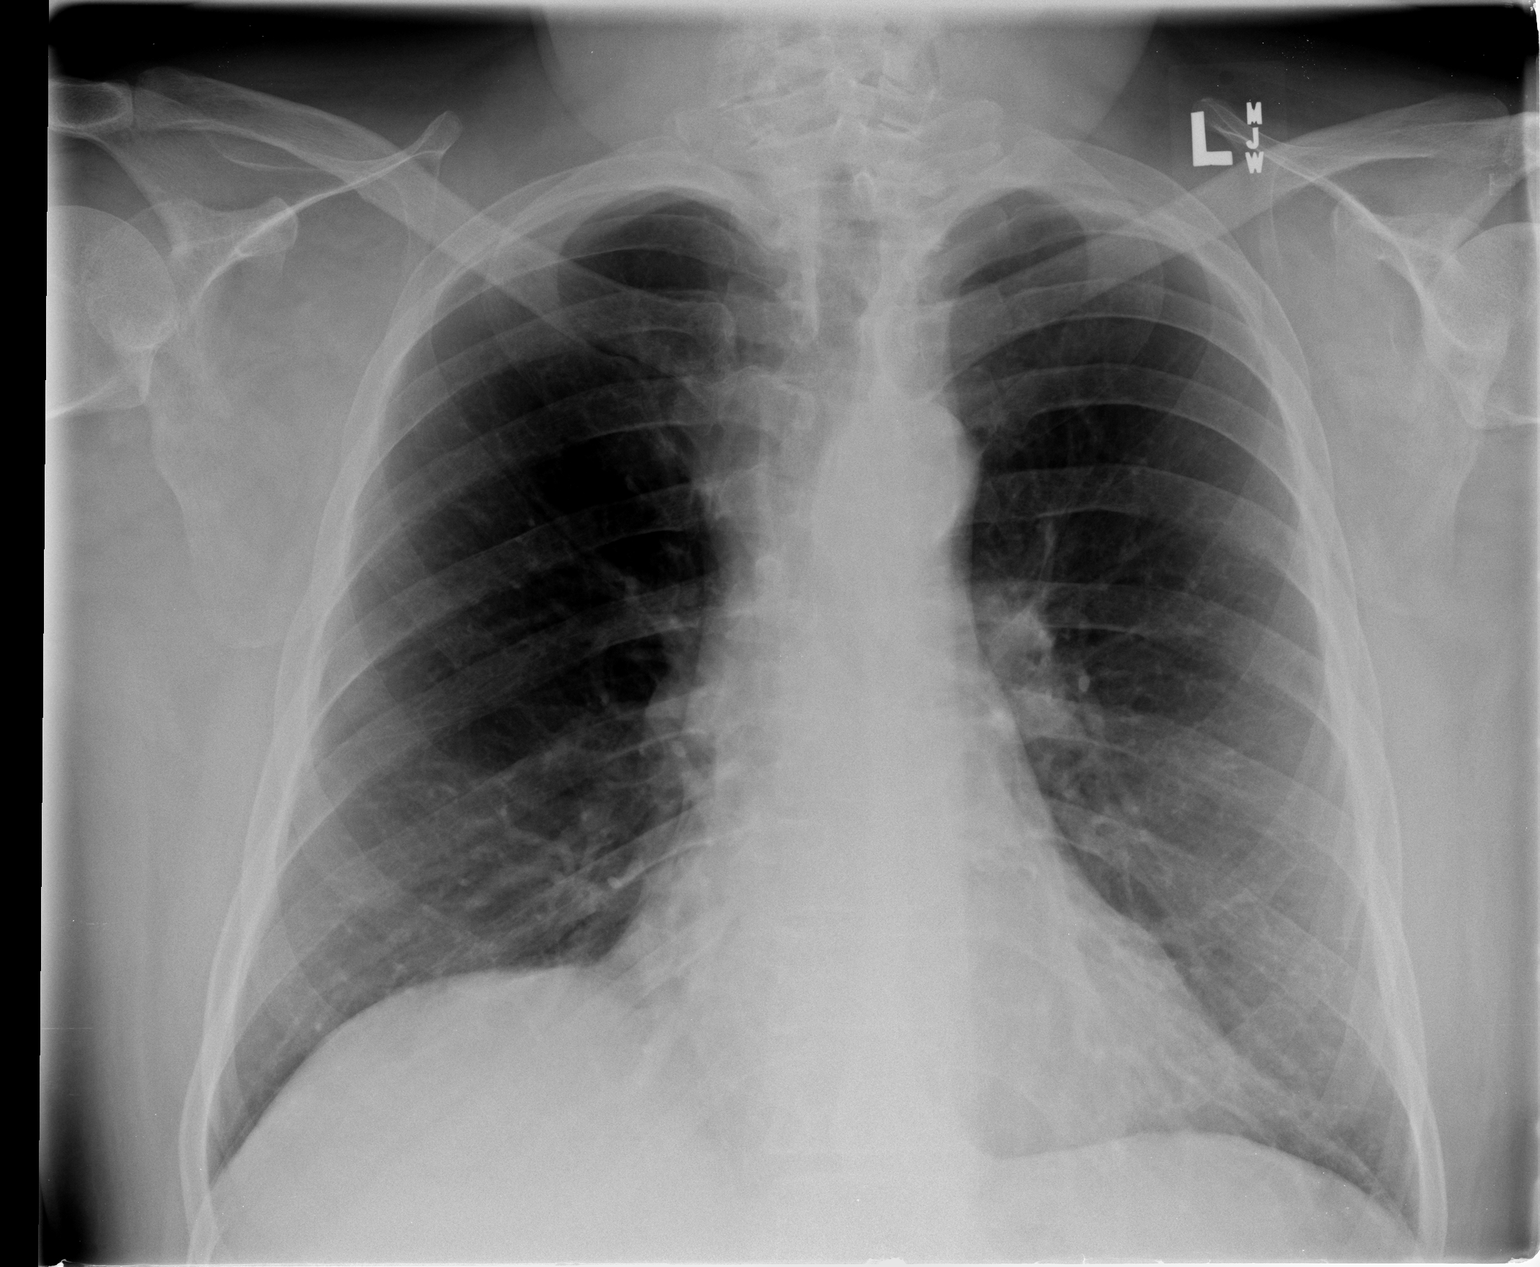

[view not recorded (2 of 2)]
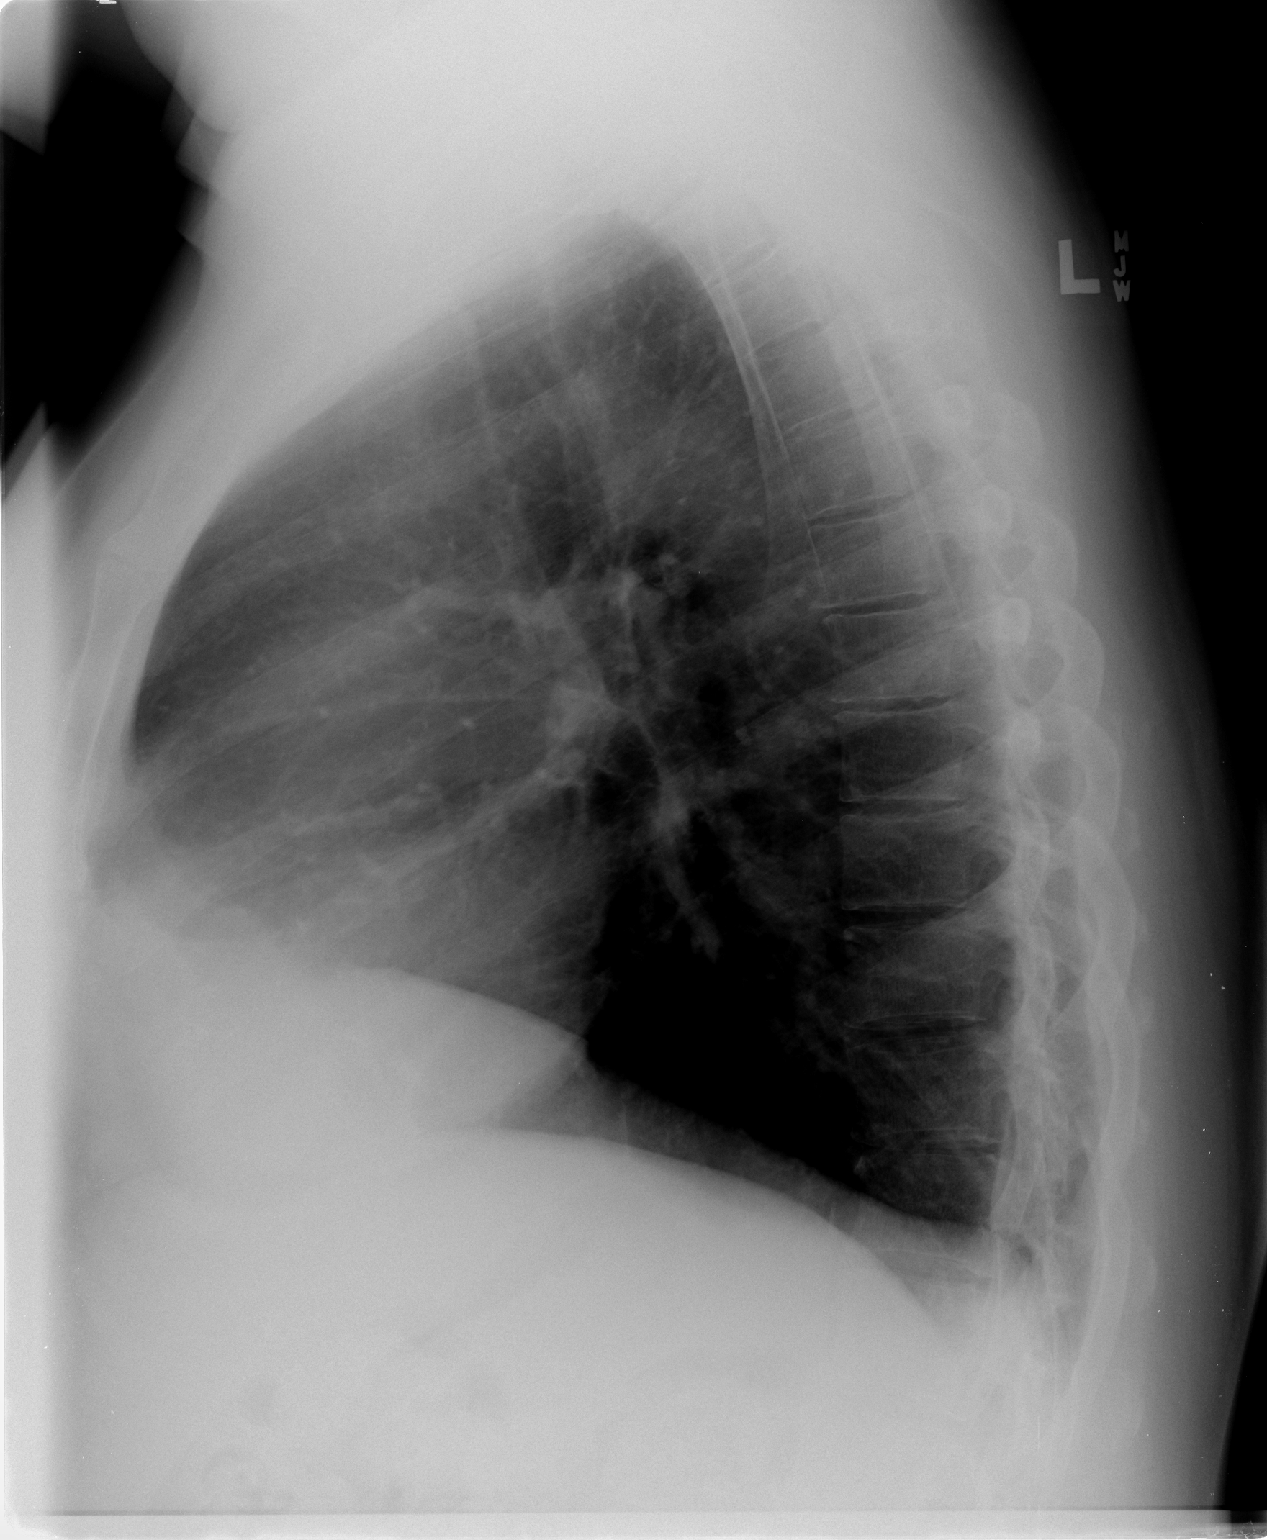

[2 of 2 positions shown; findings below may reference images not displayed]

FINDINGS: Heart mediastinal contours normal.  There is
peribronchial thickening with mild hyperaeration.  No acute
process.  Osseous structures intact.
IMPRESSION: Chronic changes - no acute process.

## 2009-06-11 IMAGING — CR DG CHEST 1V PORT
1 series · 1 of 1 positions shown · non-contrast
Comparison: 10/09/2007.

CLINICAL DATA: Central line placement.

PORTABLE CHEST - 1 VIEW

[view not recorded]
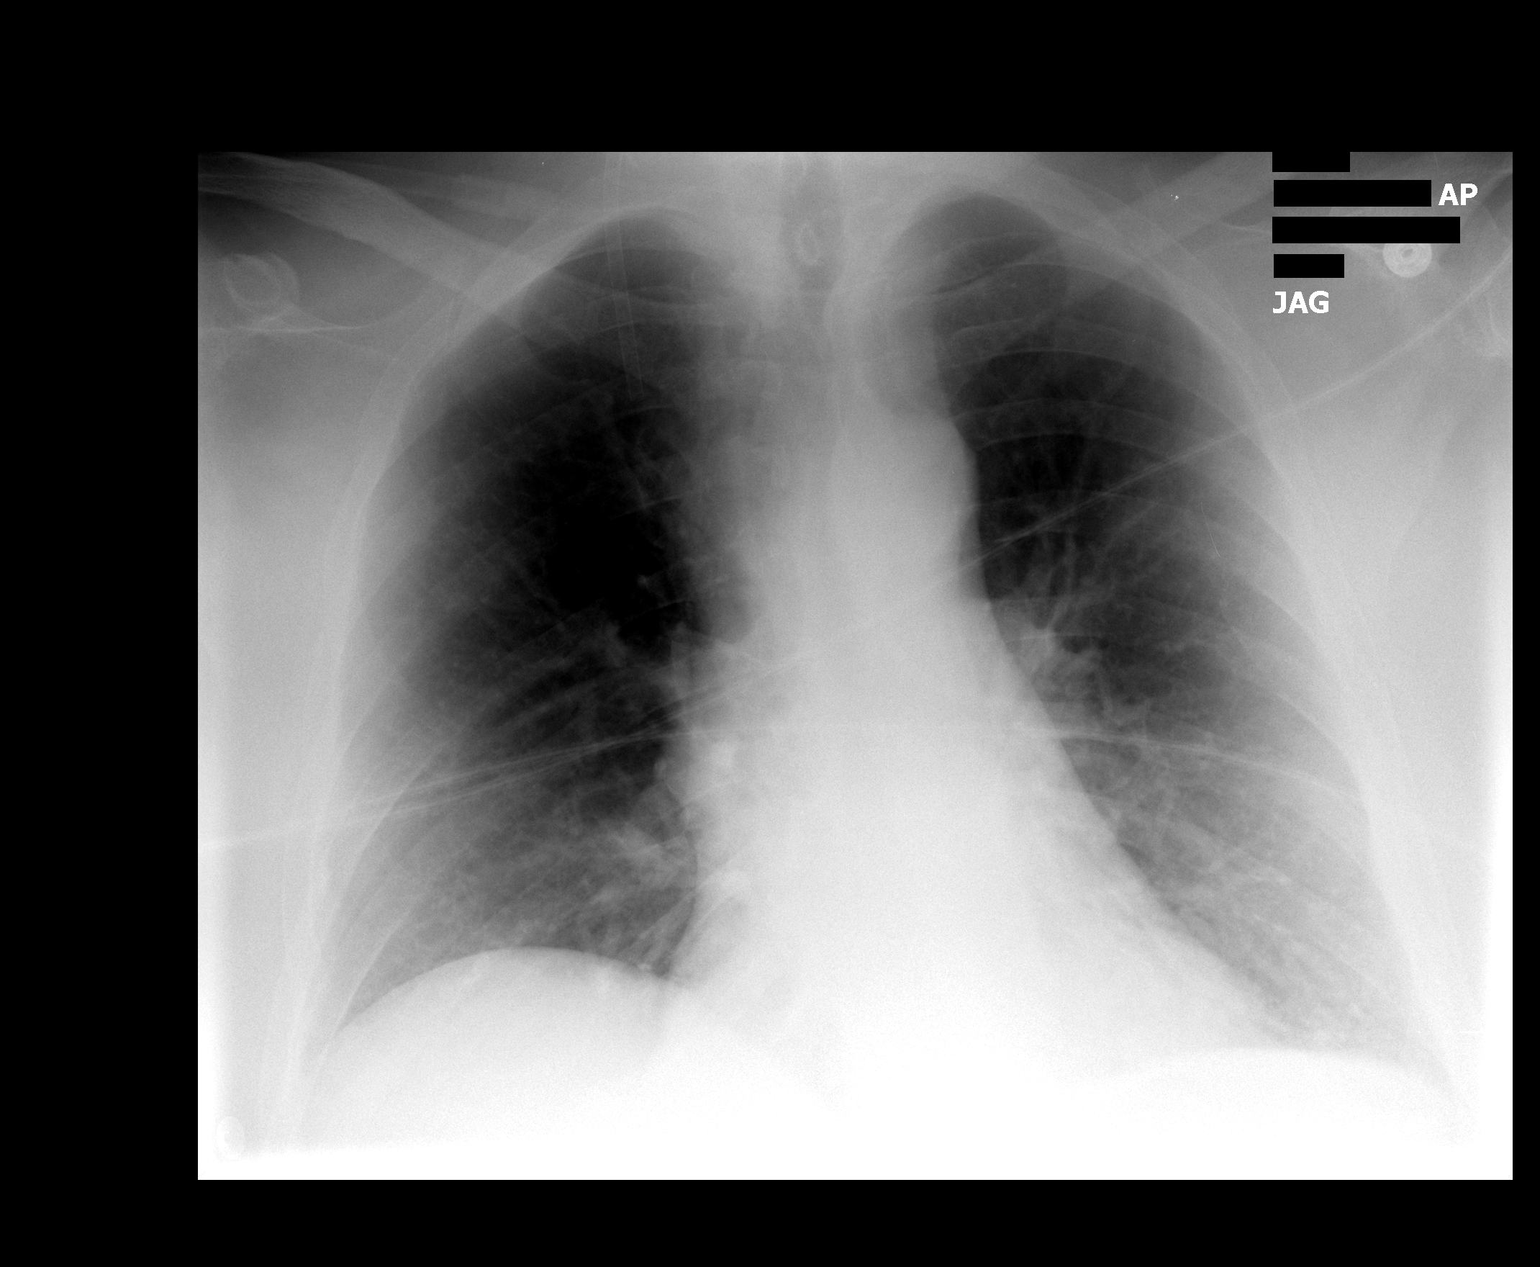

[1 of 1 positions shown; findings below may reference images not displayed]

FINDINGS: Trachea is midline.  Heart size stable.  Right IJ
catheter sheath terminates in the lower right internal jugular
vein.  No definite pneumothorax.  Left basilar atelectasis.
IMPRESSION: 1.  Right IJ central line placement without pneumothorax.
2.  Left basilar atelectasis.

## 2009-07-07 ENCOUNTER — Ambulatory Visit: Payer: Self-pay | Admitting: Gastroenterology

## 2009-07-07 IMAGING — CT CT ANGIO ABDOMEN
2 of 5 series · 16 of 46 positions shown, 18 images · IV contrast ([ID] OMNI 350)
Comparison: 10/11/2007, 09/18/2007

CTA ABDOMEN

CLINICAL DATA: Abdominal aortic aneurysm status post stent graft
repair 10/11/2007

CT ANGIOGRAPHY ABDOMEN AND PELVIS
TECHNIQUE: Multidetector CT imaging of the abdomen and pelvis was
performed using the standard protocol during bolus administration
of intravenous contrast. Multiplanar reconstructed images obtained
and reviewed to evaluate the vascular anatomy.
Contrast: 100 ml Omnipaque 350

[Series 5: angio · axial · 0.74mm/px · z∈[-475,+0]mm · 13 of 268 slices shown, 15 images]
[im 12/268  soft-tissue]
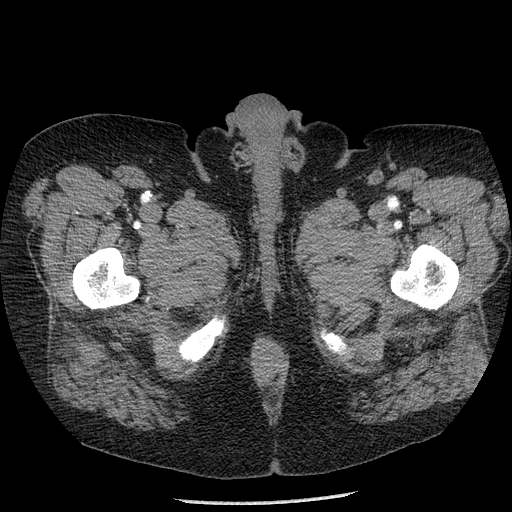
[im 12/268  bone]
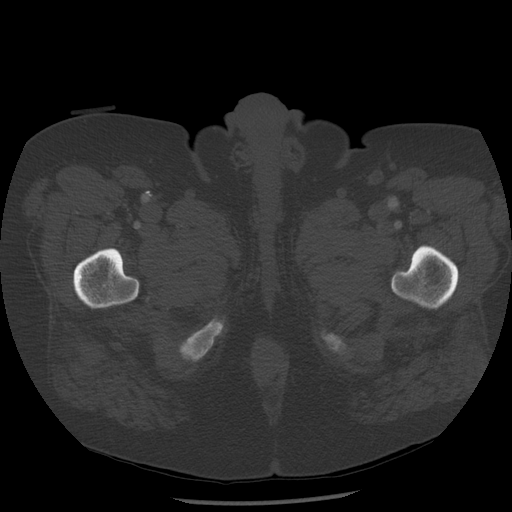
[im 35/268  soft-tissue]
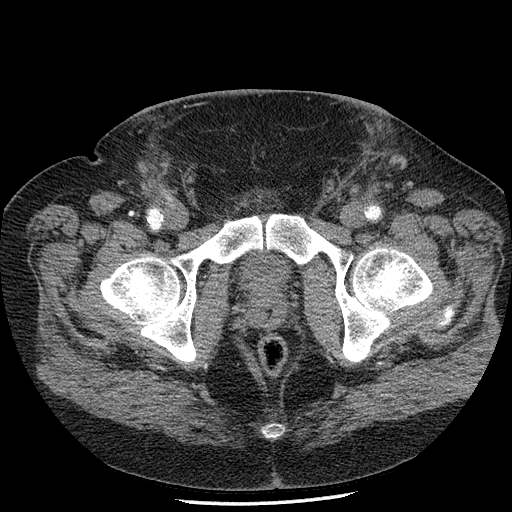
[im 59/268  soft-tissue]
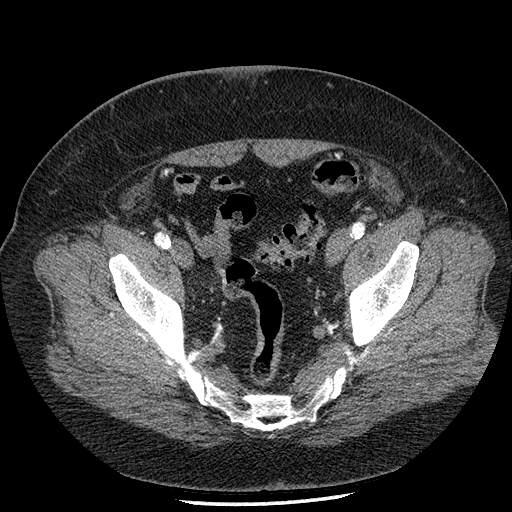
[im 70/268  soft-tissue]
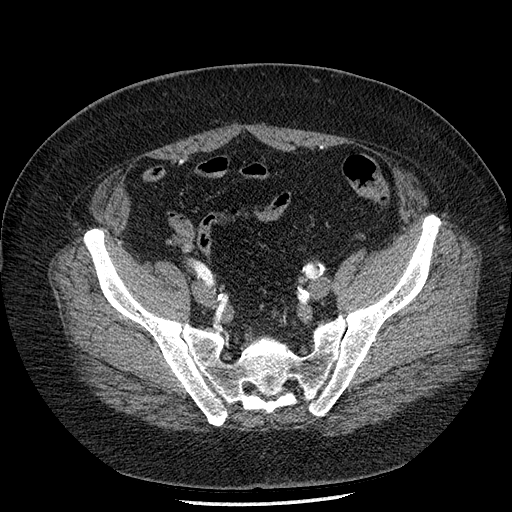
[im 93/268  soft-tissue]
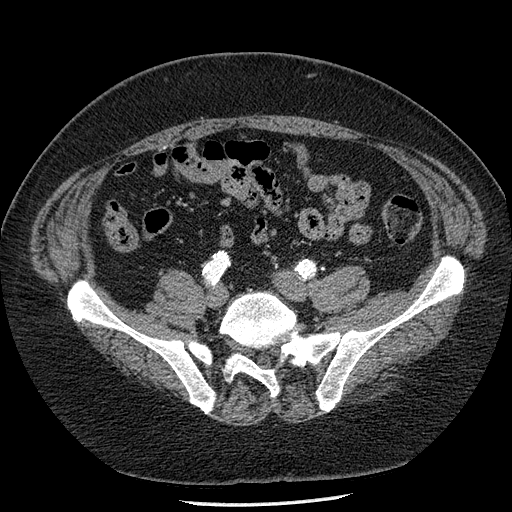
[im 117/268  soft-tissue]
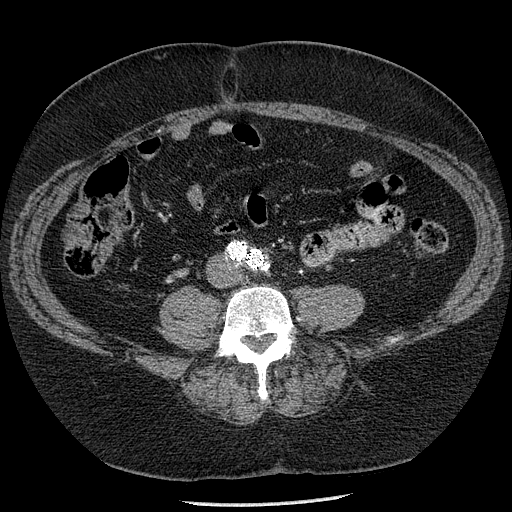
[im 140/268  soft-tissue]
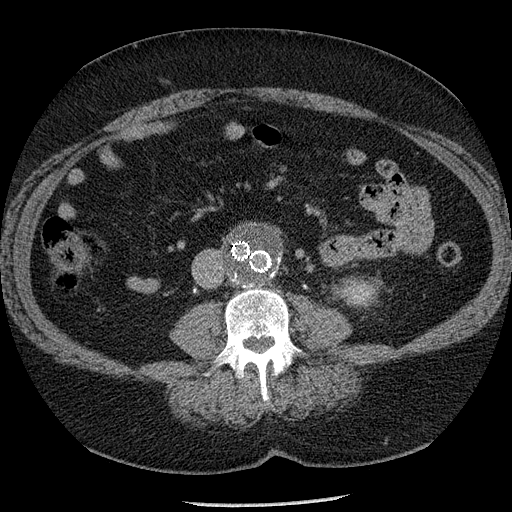
[im 151/268  soft-tissue]
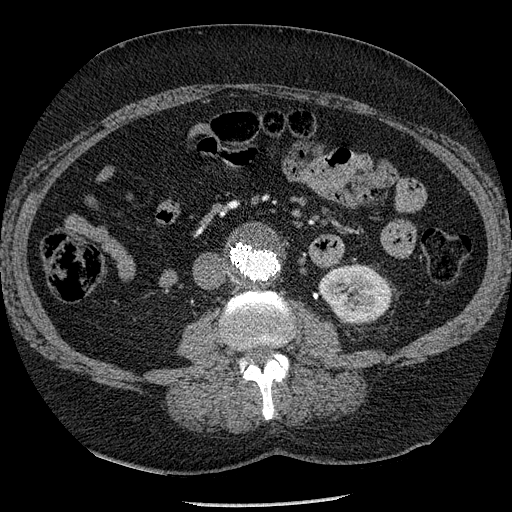
[im 175/268  soft-tissue]
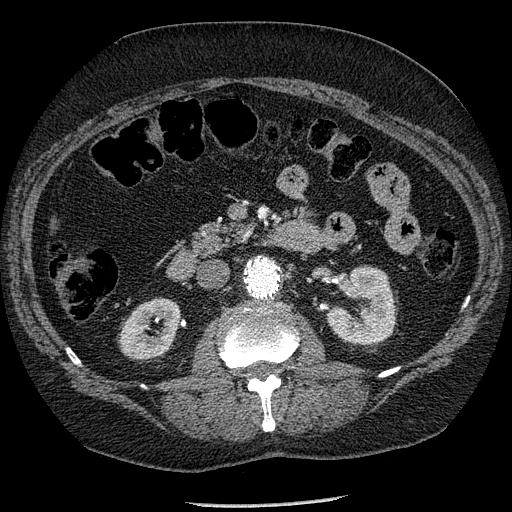
[im 175/268  bone]
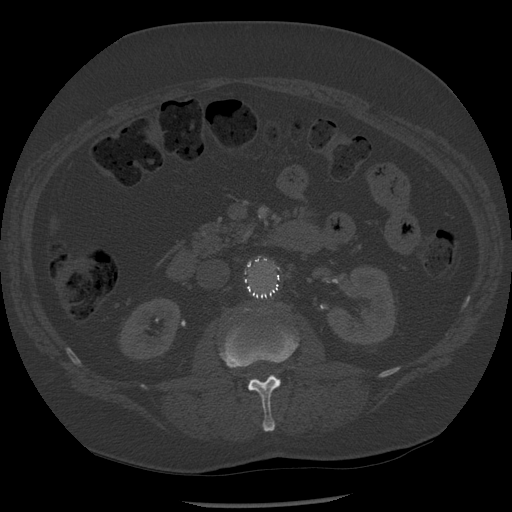
[im 198/268  soft-tissue]
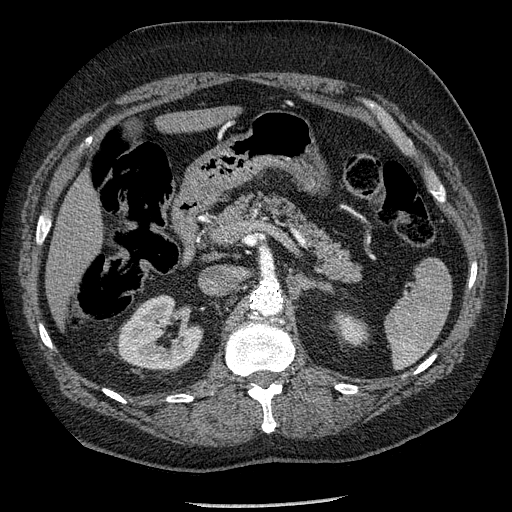
[im 209/268  soft-tissue]
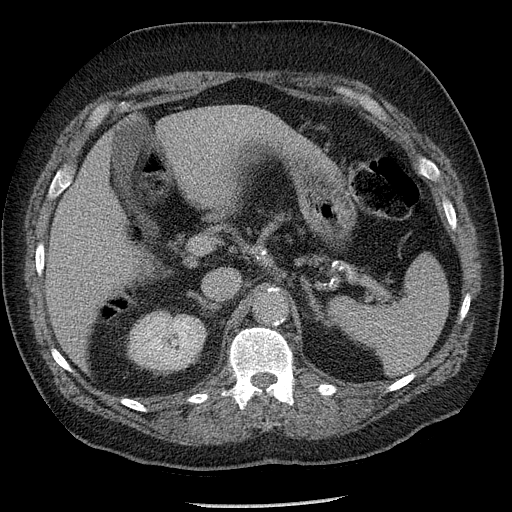
[im 233/268  soft-tissue]
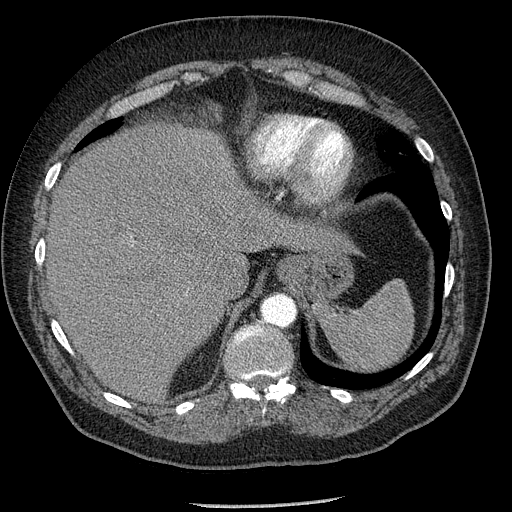
[im 256/268  soft-tissue]
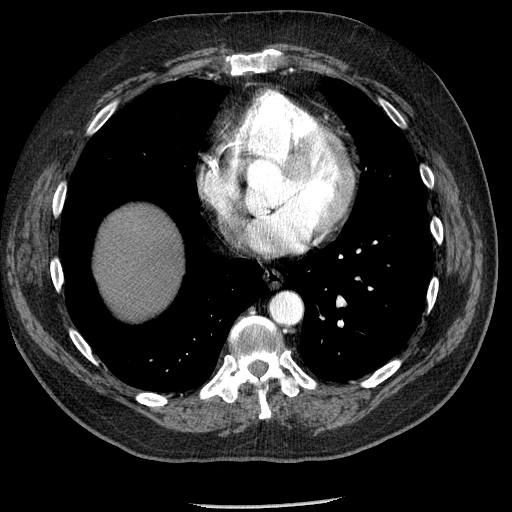

[Series 602: sagittal body · sagittal · 1.04mm/px · 3 of 153 slices shown]
[im 51/153  soft-tissue]
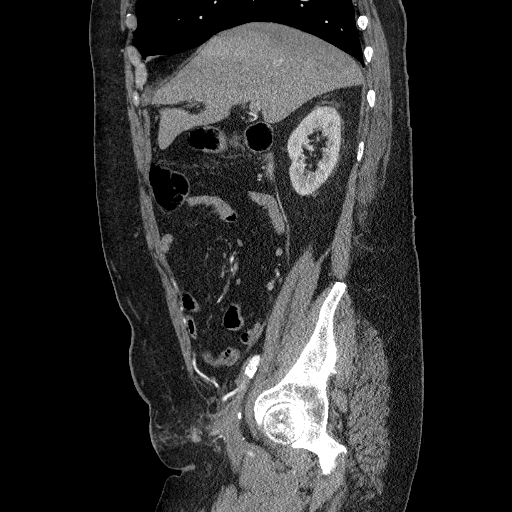
[im 68/153  soft-tissue]
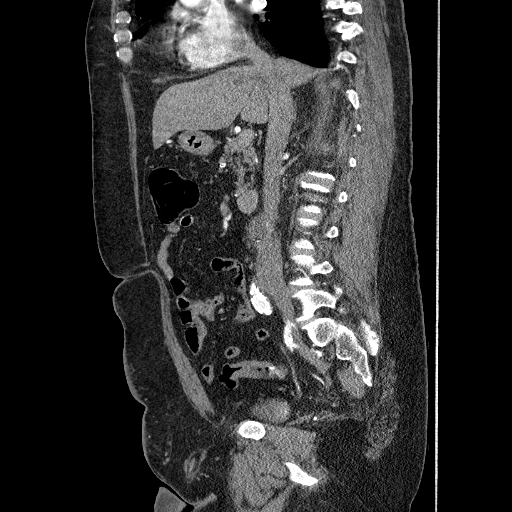
[im 85/153  soft-tissue]
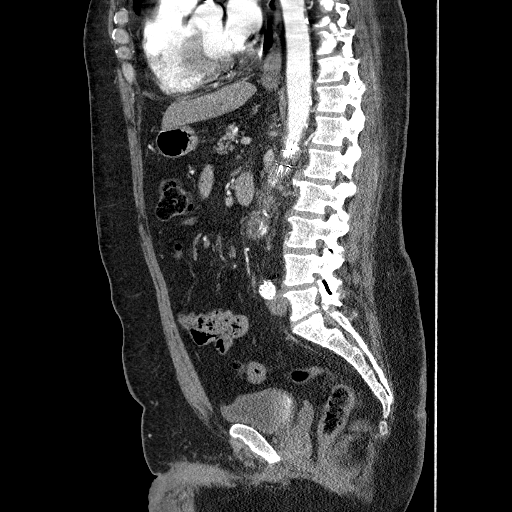

[16 of 46 positions shown; findings below may reference images not displayed]

FINDINGS: The patient has undergone interval abdominal aortic
aneurysm repair with a endovascular stent graft.  Proximal
attachment is suprarenal and just inferior to the superior
mesenteric artery.  Renal arteries are well enhanced and patent.
The stent graft is patent.  No evidence of Endo leak.  Native
aneurysm sac is thrombosed.  Thrombosed native aneurysm has a
stable diameter measuring 5.1 cm AP  and 4.6 cm transverse.  No
evidence of retroperitoneal hemorrhage.  The celiac, SMA and renal
arteries are patent.  The IMA is reconstituted via mesenteric
collaterals from the SMA.  Iliac limbs of the graft are patent.
Distal attachment sites are in the common iliac arteries above the
internal iliac origins.  No evidence of Endo graft occlusion,
thrombus or stenosis.

Additional imaging of the abdomen demonstrates no pericardial or
pleural effusion.  Lung bases are clear.  The liver, gallbladder,
biliary system, adrenal glands, kidneys, spleen, and pancreas are
within normal limits.  No bowel obstruction or dilatation.  Exam of
the bowel is limited because of the lack of oral contrast for a CTA
protocol.  Degenerative changes present the spine.
IMPRESSION: Status post abdominal aortic aneurysm repair with a endovascular
stent graft.  No evidence of Endo leak.  Native aneurysm sac is
thrombosed and stable in diameter.  Endovascular stent graft is
patent.

CTA PELVIS
FINDINGS: Iliac limbs of the stent graft are patent.  Distal
attachment sites are within the common iliac arteries above the
internal iliac artery bifurcation.  The external and internal iliac
arteries are calcified but patent bilaterally.  No evidence of
distal attachment Endo leak.  No retroperitoneal hemorrhage or free
fluid.  Postoperative changes present in the inguinal regions
bilaterally.  The common femoral, profunda femoral, and superficial
femoral arteries visualized are patent proximally.
IMPRESSION: Patent iliac limbs of the stent graft.  Negative for distal
attachment site complication or endoleak.  No acute intrapelvic
finding.

## 2009-11-10 ENCOUNTER — Ambulatory Visit: Payer: Self-pay | Admitting: Surgery

## 2009-11-10 ENCOUNTER — Encounter: Admission: RE | Admit: 2009-11-10 | Discharge: 2009-11-10 | Payer: Self-pay | Admitting: Surgery

## 2009-12-29 IMAGING — CT CT ANGIO ABDOMEN
2 of 5 series · 17 of 46 positions shown, 19 images · non-contrast
Comparison: 11/06/2007

CTA ABDOMEN

CLINICAL DATA: Follow-up aortic stent graft

CT ANGIOGRAPHY  ABDOMEN AND PELVIS
TECHNIQUE: Multidetector CT imaging through the abdomen and pelvis
using the standard protocol during bolus administration of
intravenous contrast. Multiplanar reconstructed images obtained and
reviewed to evaluate the vascular anatomy. 120 ml Nmnipaque-0LL

[Series 5: angio · axial · 0.82mm/px · z∈[-468,-12]mm · 14 of 288 slices shown, 16 images]
[im 13/288  soft-tissue]
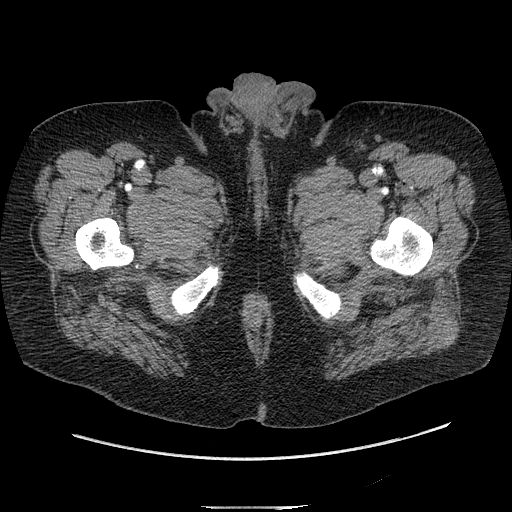
[im 13/288  bone]
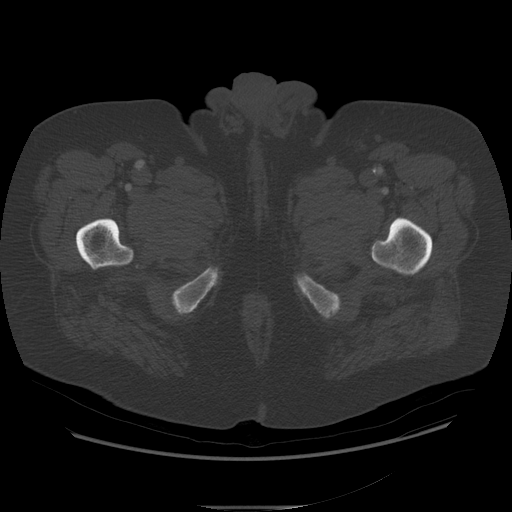
[im 38/288  soft-tissue]
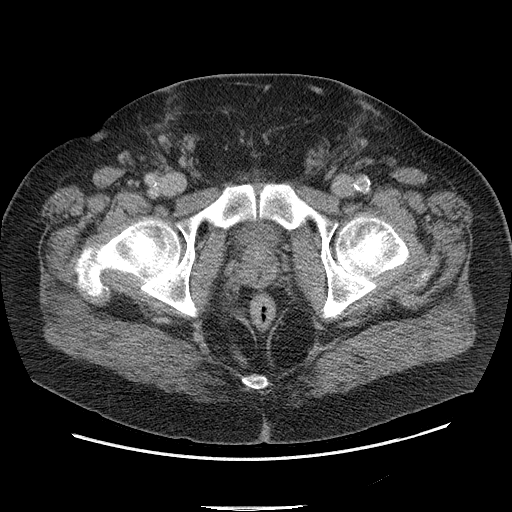
[im 50/288  soft-tissue]
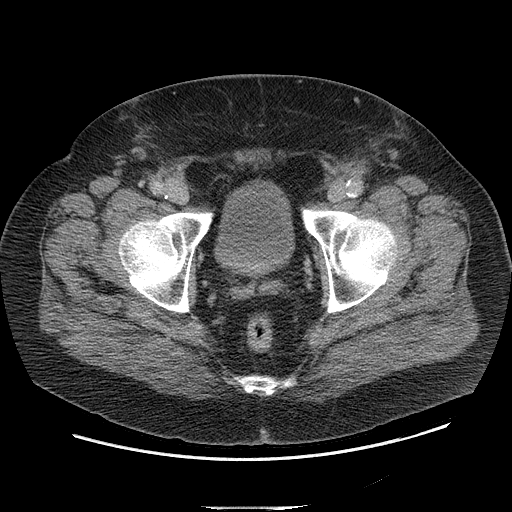
[im 75/288  soft-tissue]
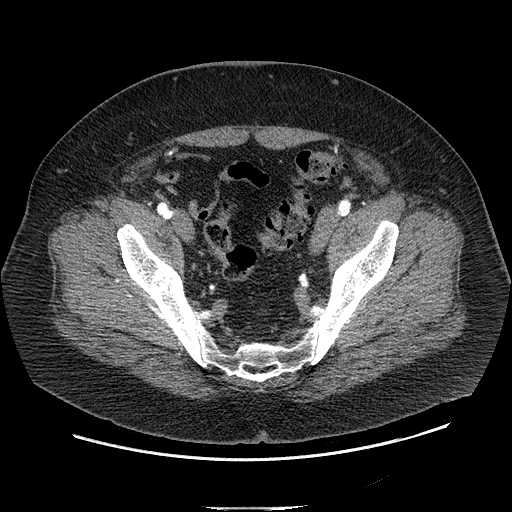
[im 100/288  soft-tissue]
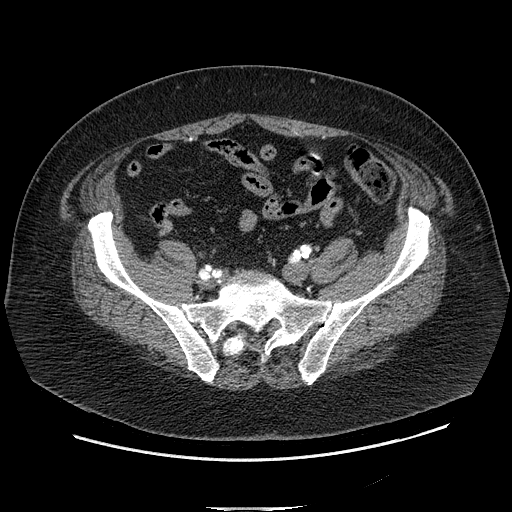
[im 113/288  soft-tissue]
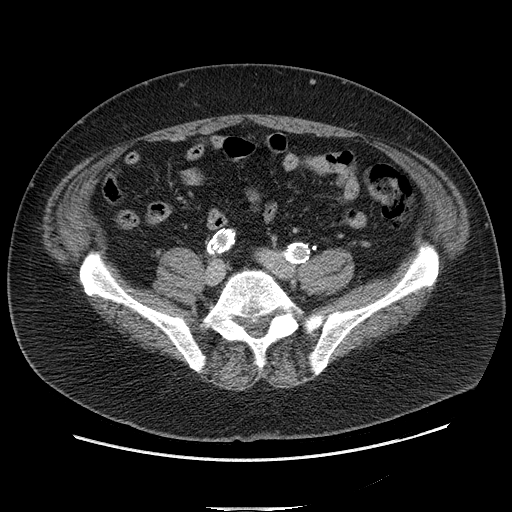
[im 138/288  soft-tissue]
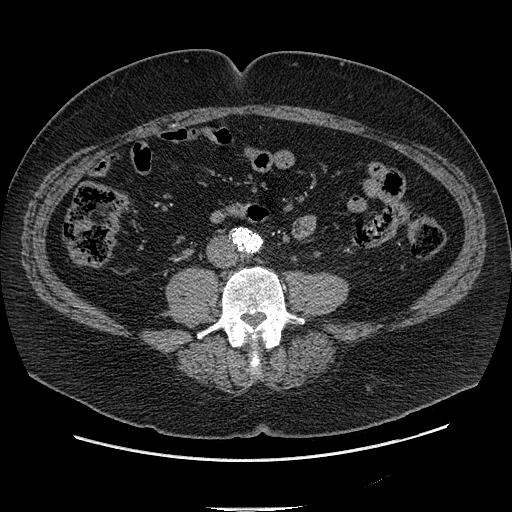
[im 150/288  soft-tissue]
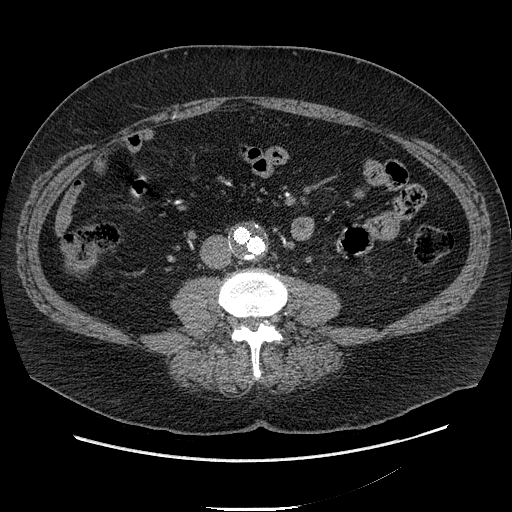
[im 175/288  soft-tissue]
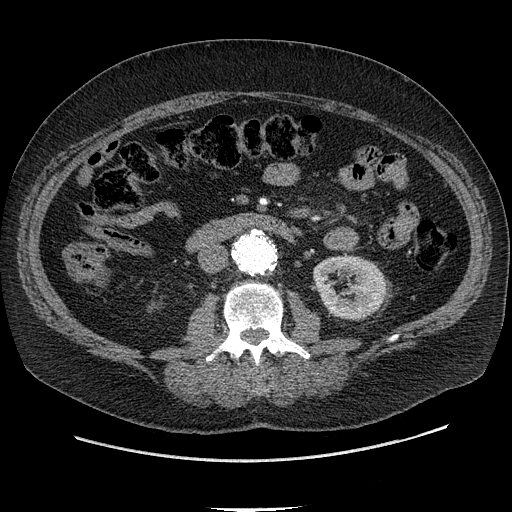
[im 175/288  bone]
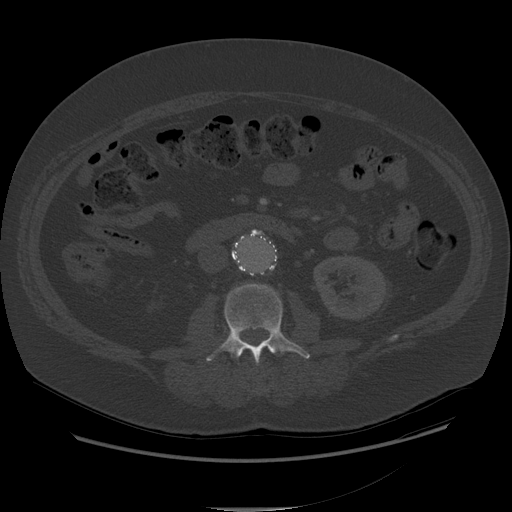
[im 188/288  soft-tissue]
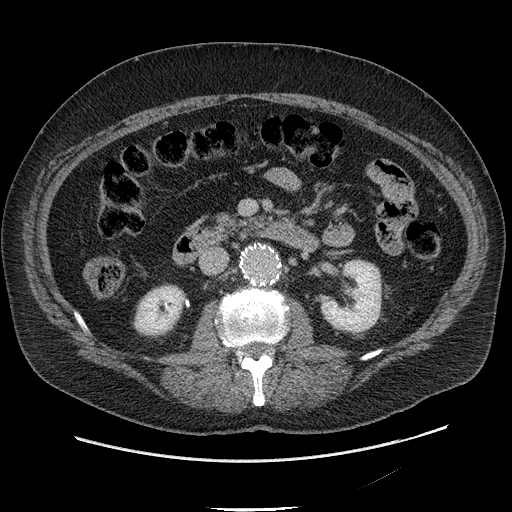
[im 213/288  soft-tissue]
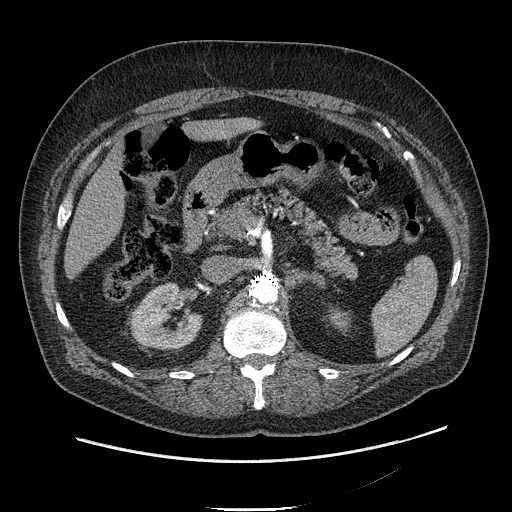
[im 238/288  soft-tissue]
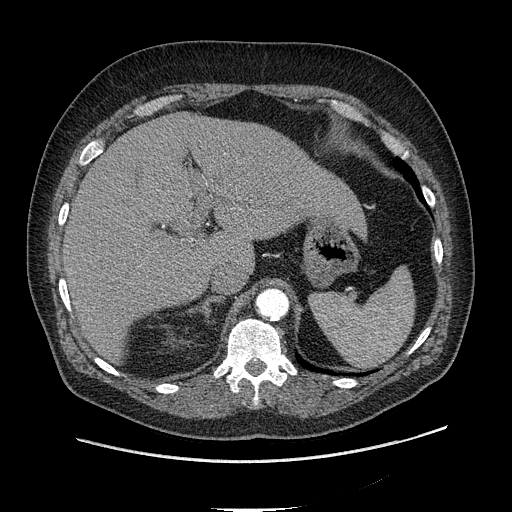
[im 250/288  soft-tissue]
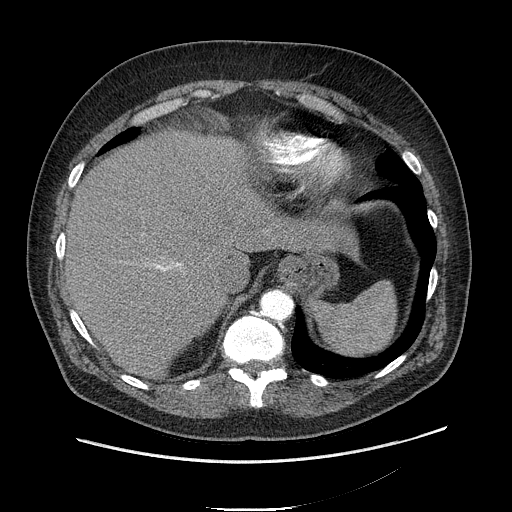
[im 275/288  soft-tissue]
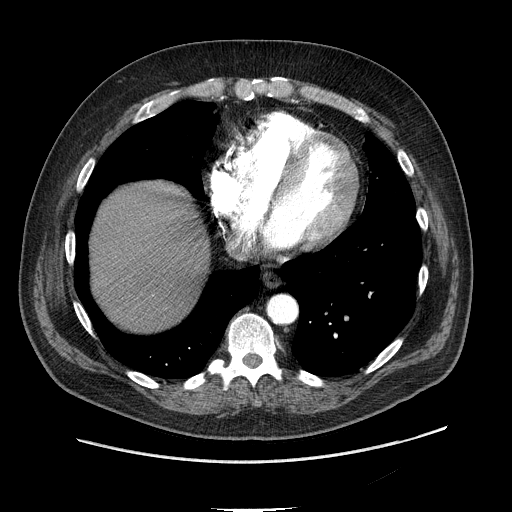

[Series 602: sagittal body · sagittal · 1.01mm/px · 3 of 169 slices shown]
[im 57/169  soft-tissue]
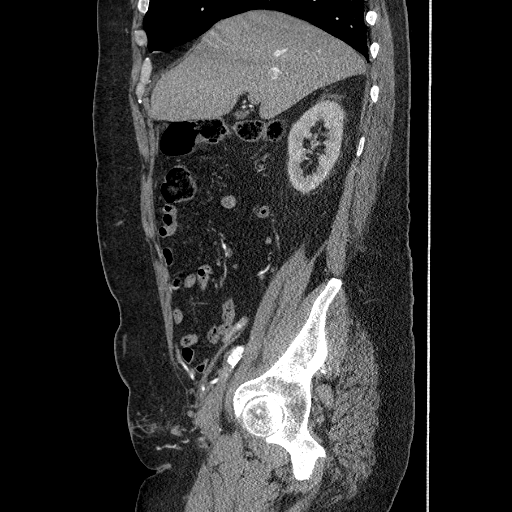
[im 75/169  soft-tissue]
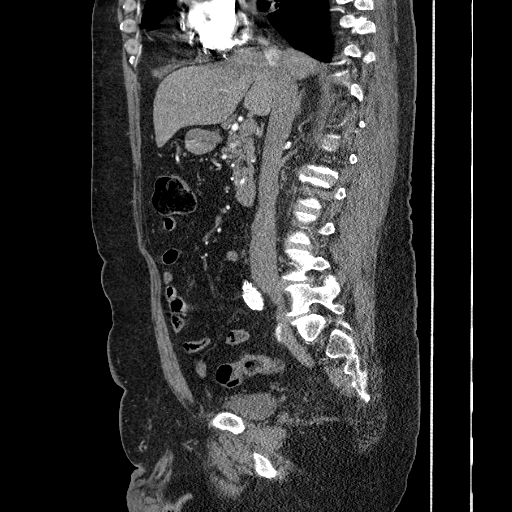
[im 94/169  soft-tissue]
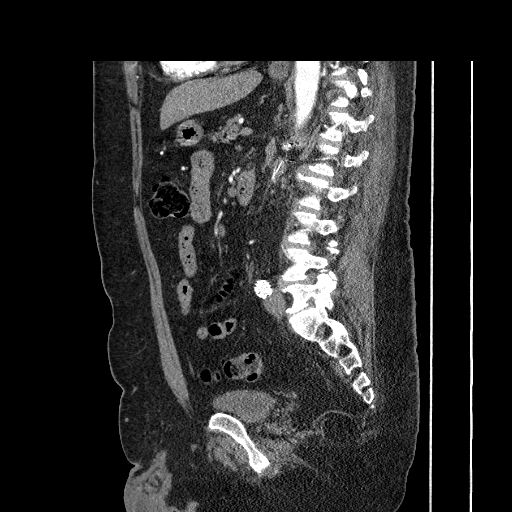

[17 of 46 positions shown; findings below may reference images not displayed]

FINDINGS: AP and transverse diameters of the aneurysm sac are
x 4.4 cm which has reduced in size.  The stent graft is stable in
position.  There is no evidence of Endo leak.  It is widely patent.
The SMA and bilateral renal arteries are patent.  The IMA
reconstitutes.  Severe narrowing at the origin of the celiac axis
is stable.

The liver, gallbladder, spleen, pancreas, adrenal glands are
stable.  Negative free fluid or abnormal adenopathy.  Kidneys are
unremarkable.
IMPRESSION: Aortic stent graft without complication.  Aneurysm sac has shrunk.

CTA PELVIS
FINDINGS: Iliac limbs of the aortic to iliac stent graft are widely
patent.  No evidence of endoleak.  No significant common or
external iliac artery stenosis.  Negative free fluid or abnormal
adenopathy.  Postoperative changes in the groins are noted.  The
bladder and prostate are unremarkable.
IMPRESSION: No acute intrapelvic pathology.  Iliac limbs of the stent graft are
patent.

## 2010-05-06 ENCOUNTER — Encounter: Admission: RE | Admit: 2010-05-06 | Discharge: 2010-05-06 | Payer: Self-pay | Admitting: Neurosurgery

## 2010-06-08 ENCOUNTER — Ambulatory Visit: Payer: Self-pay | Admitting: Surgery

## 2010-07-12 ENCOUNTER — Encounter: Payer: Self-pay | Admitting: Neurosurgery

## 2010-07-12 ENCOUNTER — Encounter: Payer: Self-pay | Admitting: Surgery

## 2010-08-07 ENCOUNTER — Encounter (INDEPENDENT_AMBULATORY_CARE_PROVIDER_SITE_OTHER): Payer: Private Health Insurance - Indemnity

## 2010-08-07 DIAGNOSIS — M79609 Pain in unspecified limb: Secondary | ICD-10-CM

## 2010-10-05 ENCOUNTER — Other Ambulatory Visit: Payer: Self-pay | Admitting: Neurosurgery

## 2010-10-05 DIAGNOSIS — M545 Low back pain: Secondary | ICD-10-CM

## 2010-10-06 ENCOUNTER — Other Ambulatory Visit: Payer: Self-pay | Admitting: Surgery

## 2010-10-06 DIAGNOSIS — I714 Abdominal aortic aneurysm, without rupture, unspecified: Secondary | ICD-10-CM

## 2010-10-14 ENCOUNTER — Ambulatory Visit
Admission: RE | Admit: 2010-10-14 | Discharge: 2010-10-14 | Disposition: A | Discharge status: Home / Self Care | Payer: Private Health Insurance - Indemnity | Source: Ambulatory Visit | Attending: Neurosurgery | Admitting: Neurosurgery

## 2010-10-14 DIAGNOSIS — M545 Low back pain: Secondary | ICD-10-CM

## 2010-10-14 IMAGING — CT CT L SPINE W/O CM
4 of 10 series · 11 of 33 positions shown, 13 images · non-contrast
Comparison: MRI lumbar spine 05/31/2007.  The patient has had
several CT angiograms of the abdomen since that time.

CLINICAL DATA: Severe back and left leg pain.  Pain and T L3-4.

CT LUMBAR SPINE WITHOUT CONTRAST
TECHNIQUE: Multidetector CT imaging of the lumbar spine was
performed without intravenous contrast administration. Multiplanar
CT image reconstructions were also generated.

[Series 2: l spine · axial · 0.27mm/px · z∈[-203,-120]mm · 2 of 100 slices shown]
[im 34/100  bone]
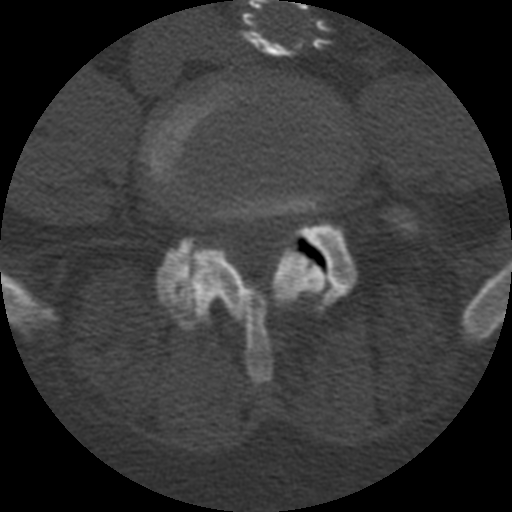
[im 67/100  bone]
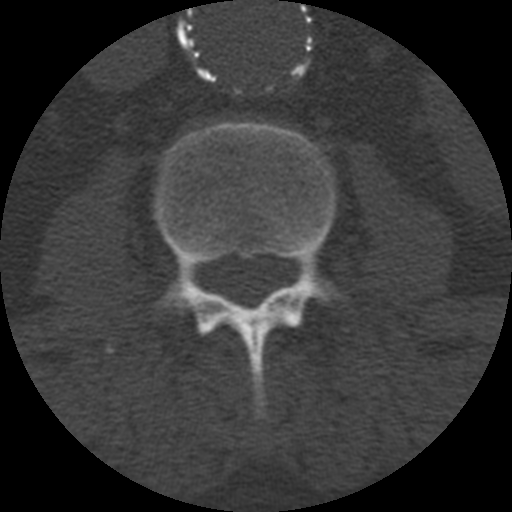

[Series 3: bone windows · axial · 0.27mm/px · z∈[-226,-100]mm · 3 of 100 slices shown, 4 images]
[im 25/100  soft-tissue]
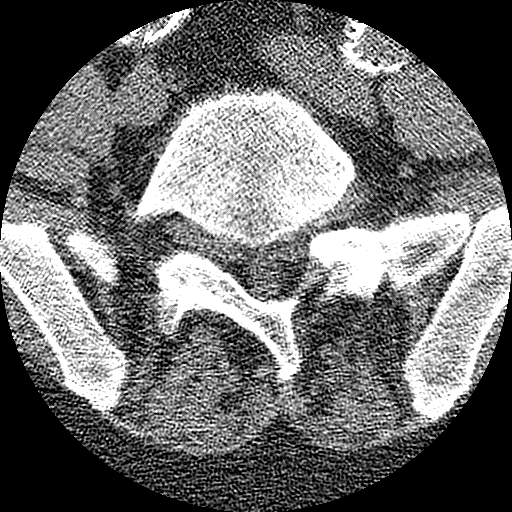
[im 25/100  bone]
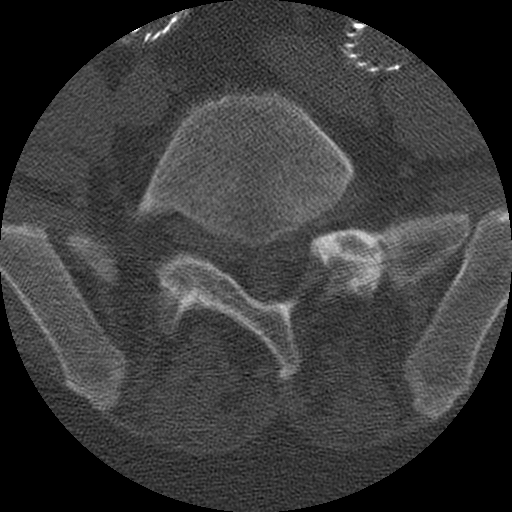
[im 50/100  bone]
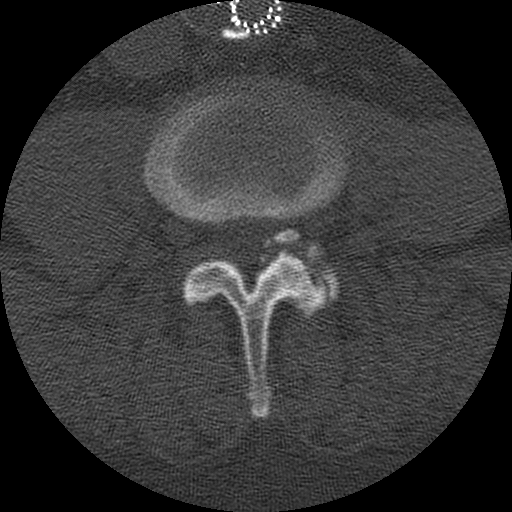
[im 75/100  bone]
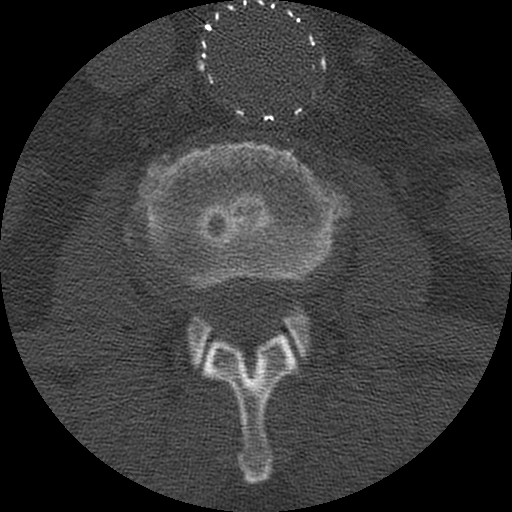

[Series 401: cor lower l spine · coronal · 0.31mm/px · 1 of 40 slices shown]
[im 20/40  bone]
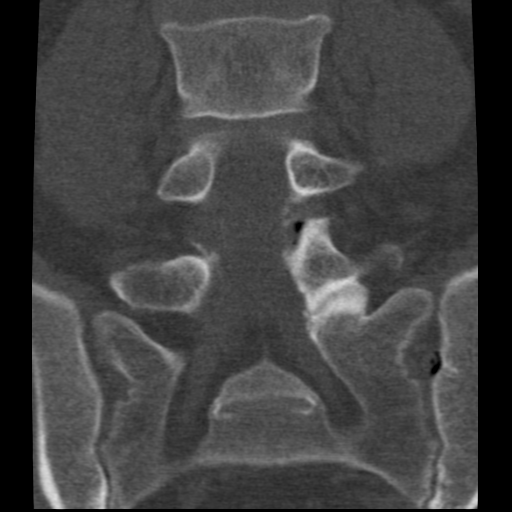

[Series 402: sag · sagittal · 0.49mm/px · 5 of 40 slices shown, 6 images]
[im 14/40  bone]
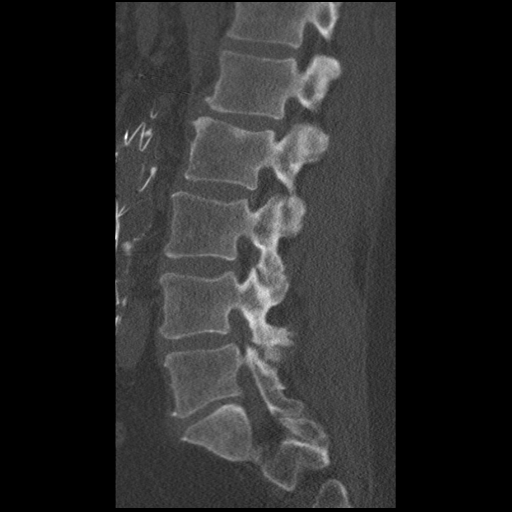
[im 17/40  bone]
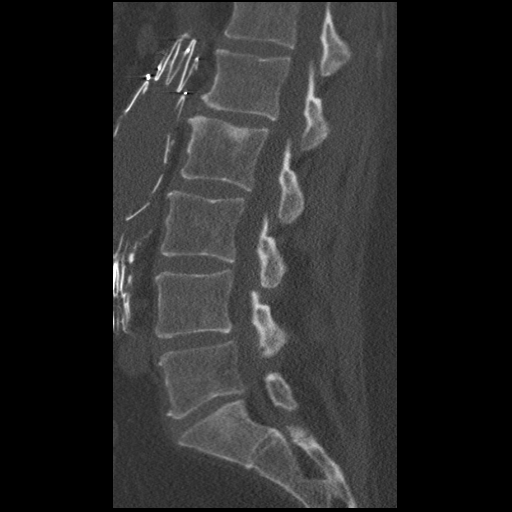
[im 20/40  soft-tissue]
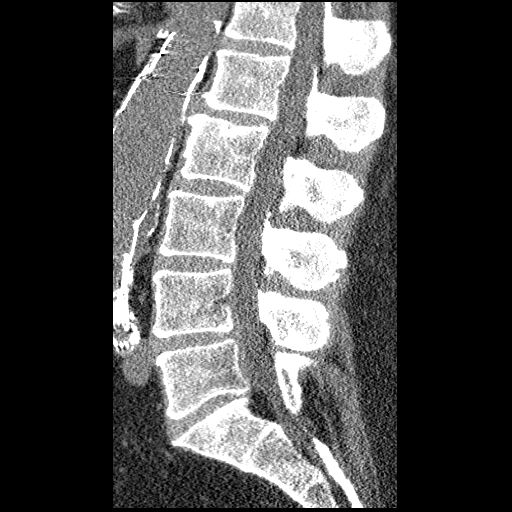
[im 20/40  bone]
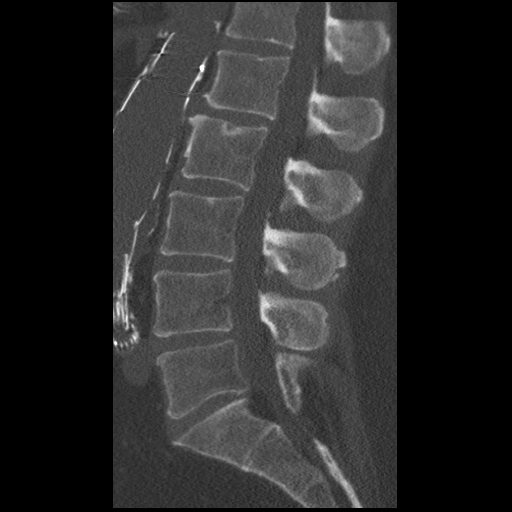
[im 23/40  bone]
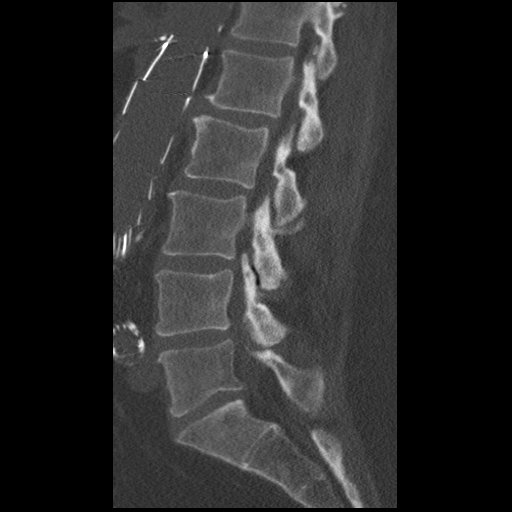
[im 27/40  bone]
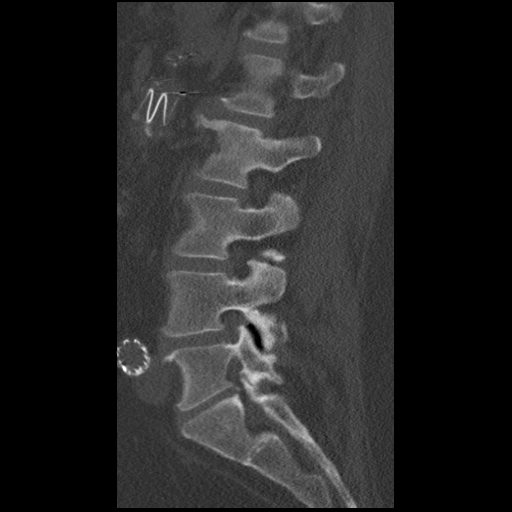

[11 of 33 positions shown; findings below may reference images not displayed]

FINDINGS: The lumbar spine is imaged from the midbody of T12-S2.
Slight retrolisthesis at L1-2 is stable from the prior MRI.  An
aortic stent graft is in place without significant change.  Limited
imaging of the abdomen is otherwise unremarkable.  The superior
endplate Schmorl's node at L2 is stable.  The vertebral body
heights alignment are otherwise maintained.  Individual disc levels
are as follows.

T12-L1:  There is no significant disc herniation or stenosis.

L1-2:  Mild disc bulging is similar to the prior exam.  There is no
focal stenosis.

L2-3:  There is no significant disc herniation or stenosis.
Moderate facet hypertrophy is present.

L3-4:  Asymmetric left-sided facet hypertrophy has progressed since
the prior exam.  There is leftward disc bulging as well.  This
results in narrowing of the left lateral recess and mild left
foraminal narrowing.  The right foramen is patent.

L4-5:  Advanced facet hypertrophy is present bilaterally.  Leftward
disc bulging is noted.  There is moderate foraminal narrowing
bilaterally, left worse than right.  Lateral recess narrowing is
also present on the left.

L5-S1:  The patient is status post right hemilaminotomy.  There is
asymmetric left-sided facet hypertrophy.  There is mild osseous
foraminal narrowing on the left.
IMPRESSION: 1.  Progressive left-sided facet hypertrophy and leftward disc
bulging at L3-4 results in new left lateral recess and foraminal
narrowing.
2.  Stable slight retrolisthesis and disc bulging at L1-2.
3.  No significant change in the bilateral foraminal narrowing and
lateral recess narrowing at L4-5.
4.  Postoperative changes of right hemilaminotomy at L5-S1.
5.  Mild osseous foraminal narrowing on the left at L5-S1.

## 2010-10-29 ENCOUNTER — Other Ambulatory Visit: Payer: Private Health Insurance - Indemnity

## 2010-11-03 NOTE — Discharge Summary (Signed)
NAMEJODI, KAPPES                 ACCOUNT NO.:  [id number]   MEDICAL RECORD NO.:  [id number]          PATIENT TYPE:  INP   LOCATION:  3311                         FACILITY:  [facility]   PHYSICIAN:  Wilmon Arms, PA    DATE OF BIRTH:  03-17-1946   DATE OF ADMISSION:  10/11/2007  DATE OF DISCHARGE:  10/12/2007                               DISCHARGE SUMMARY   PRIMARY CARE PHYSICIAN:  Payton Doughty, M.D.   DISCHARGE DIAGNOSES:  1. Abdominal aortic aneurysm.  2. Hypertension.  3. Back pain.   PROCEDURES PERFORMED:  Endovascular repair of abdominal aortic aneurysm.   DISCHARGE MEDICATIONS:  1. Norvasc 5 mg p.o. daily.  2. Lisinopril 20 mg p.o. daily.  3. Hydrochlorothiazide 25 mg p.o. daily.  4. Percocet 5/325 mg 1 p.o. q. 4h. p.r.n. pain.   DISPOSITION:  He was discharged home in stable condition with his wounds  healing well.  He is instructed to clean his wound with soap and warm  water.  He is to observe his wound for drainage, increased pain,  swelling, redness, or fever greater than 101.2.  He has to return to see  Dr. Myra Gianotti in 3 weeks with ABIs and a CT of his abdomen.   BRIEF IDENTIFYING STATEMENT:  For complete details, please refer to the  typed history and physical.   BRIEF HISTORY:  This is a very pleasant 64 year old gentleman who was  evaluated by Dr. Myra Gianotti for an abdominal aortic aneurysm.  Dr. Myra Gianotti  felt him to be a suitable candidate for endovascular repair.  He was  informed of the risks and benefits of procedure and after careful  consideration, elected to proceed with surgery.   HOSPITAL COURSE:  Preoperative workup was completed as an outpatient.  He was brought in through the same day surgery and underwent the  aforementioned endovascular repair of an abdominal aortic aneurysm.  For  complete details, please refer to the typed operative report.  The  procedure was without complications.  He was returned to the  postanesthesia care unit and extubated.   Following stabilization, he was  returned to a bed in a surgical step-down unit.  He was observed  overnight.  He remained stable and was desirous of being discharged on  the first postoperative day.  His wounds are healing well.  He was  discharged home in stable condition.      Wilmon Arms, PA     KEL/MEDQ  D:  10/12/2007  T:  10/12/2007  Job:  [id number]   cc:   Payton Doughty, M.D.

## 2010-11-03 NOTE — Op Note (Signed)
NAMEKIE, CALVIN                 ACCOUNT NO.:  [id number]   MEDICAL RECORD NO.:  [id number]          PATIENT TYPE:  INP   LOCATION:  3311                         FACILITY:  [facility]   PHYSICIAN:  Juleen China IV, MDDATE OF BIRTH:  04/03/1946   DATE OF PROCEDURE:  10/11/2007  DATE OF DISCHARGE:                               OPERATIVE REPORT   PREOPERATIVE DIAGNOSIS:  Abdominal aortic aneurysm.   POSTOPERATIVE DIAGNOSIS:  Abdominal aortic aneurysm.   PROCEDURES PERFORMED:  1. Endovascular exclusion of abdominal aortic aneurysm.  2. Bilateral common femoral artery exposure.  3. Catheter in aorta x2  4. Abdominal aortogram.  5. Supervision and interpretation codes.   SURGEON:  1. Charlena Cross, MD   CO-SURGEONDi Kindle. Edilia Bo, M.D.   ANESTHESIA:  General.   BLOOD LOSS:  150 mL.   FINDINGS:  Complete exclusion.   DRAINS:  None.   CULTURES:  None.   SPECIMENS:  None.   COMPLICATIONS:  None.   Devices used:  1. Main body Cook Zenith Flex device 32 x 96, lot [id number].  2. Right limb is a Tax inspector TFLE 14 x 90 - ZT lot [id number].  3. Left leg is a Tax inspector TFLE 14 x 71 lot [id number].   INDICATIONS:  This is a 65 year old gentleman with an asymptomatic 5 cm  abdominal aortic aneurysm that has increased in size recently.  We  discussed open versus endovascular repair and the patient wished to  proceed with endovascular repair.  Risks and benefits were discussed.  Informed consent was signed.   PROCEDURE:  The patient was identify by holding area and he is taken to  room 9, placed supine on the table.  General endotracheal anesthesia was  administered.  A time-out was called and antibiotics were given.  Please  see Dr. Adele Dan note for exposure of the right common femoral artery.   The inguinal ligament was identified by bony landmarks on the left side.  An oblique incision was made below the inguinal ligament above the  palpable pulse of  the common femoral artery.  Bovie cautery was used to  dissect the subcutaneous tissue.  A Veenema retractors were used to aid  the exposure.  The femoral sheath was identified, and opened sharply  with Metzenbaum scissors.  Common femoral artery was then isolated and  the vessel loops were placed proximally and distally.   At this point in time,  the patient was given systemic heparinization.  Bilateral common femoral artery was accessed with 18-gauge needles.  An  0.035 wires were advanced in retrograde fashion into the abdominal areas  on a fluoroscopic visualization.  A 8-French sheath was placed  bilaterally.  An Omni flush catheter was used to perform wire exchange  on the left and Amplatz superstiff wire was placed into the descending  thoracic aorta.  The Omni flush catheter was then placed to the level of  L1 via the right groin, and abdominal aortogram was performed to  identify the location of the aortic bifurcation as well as renal  arteries.  The main body was selected.  This was a Marine scientist 32 x 96 device.  It  was prepared on the back table.  The 8-French sheath in the left groin  was removed and the main body device was advanced over the Amplatz  superstiff wire from the left groin.  Care was maintained to ensure that  the gate was in the appropriate location.  Device was advanced up to the  location of renal arteries.  Distal contrast injection was performed to  ensure the device was in the correct location below the renal arteries.  The top 2 stents were then deployed and then the contralateral gate was  exposed.  Using a Bentson wire and the Omni flush catheter, the  contralateral gate was cannulated.  The Omni flush catheter was then  advanced into the main body device, needle be freely rotated confirming  successful cannulation.   An Amplatz superstiff wire was then placed through the Omni flush  catheter, which was withdrawn down into the pelvis.  The image   intensifier was rotated into an LAO oblique, and a pelvic angiogram was  performed to identify the location of the right hypogastric artery.  Prior to doing this, the suprarenal stent was deployed.   It was determined that the right leg with a device to be a 14 x 90  device.  This was a flexed device.  This was advanced bareback and then  successfully deployed landing proximal to the right hypogastric artery.  Once this was done, the remaining portion of the ipsilateral limb was  deployed.  Next, the top cap was retrieved and the main body was  removed.  An 18-French sheath was then placed.  An Omni flush marked  catheter was then advanced through the sheath into the ipsilateral limb  and the image intensifier was rotated to an RAO oblique and a pelvic  angiogram was performed to delineate location of the left hypogastric  artery.  A 14 x 71 device was selected.  This was advanced through the  18-French sheath and then successfully deployed landing proximal to the  left hypogastric artery.  Next, the limbs were molded to confirmation  using a 12 x 4 balloons bilaterally.  An Omni flush catheter was then  advanced through the graft and a completion angiogram was performed.  Completion angiogram revealed preservation of bilateral renal arteries  as well as bilateral hypogastric arteries.  There was no evidence of any  leak.  These points were set to a close.  Sheaths and wires were removed  under fluoroscopic visualization.  Bilateral common femoral arteries  were occluded with vascular clamps proximally and distally.  The  arteriotomies were closed with running 5-0 Prolene.  The groins were  then copiously irrigated.  Protamine was given.  The femoral sheaths  were reapproximated with 2-0 Vicryl.  Subcutaneous tissue was  reapproximated to additional layers with 2-0  and 3-0 Vicryl.  Skin was closed with 4-0 Vicryl and Dermabond was  placed.  The patient had audible Doppler signals in his  feet after the  procedure.  He was successfully extubated and taken to recovery room in  stable condition.  There are no complications.           ______________________________  V. Charlena Cross, MD  Electronically Signed     VWB/MEDQ  D:  10/11/2007  T:  10/12/2007  Job:  [id number]

## 2010-11-03 NOTE — Assessment & Plan Note (Signed)
OFFICE VISIT   EDWORD, CU  DOB:  [birth year]                                       06/19/2007  AVWUJ#:[id number]   REASON FOR EVALUATION:  Abdominal aortic aneurysm.   HISTORY:  This is a 65 year old gentleman who is seen at the request of  Dr. Trey Sailors for evaluation of abdominal aortic aneurysm.  The patient  was found to have an undiagnosed abdominal aortic aneurysm when he went  in for imaging for his back pain.  The patient comes in for further  evaluation.  He states that he was unaware of his aneurysm.  He denies  having any abdominal pain.  He does complain of back pain, which is  alleviated by resting.  The patient's only other medical complaints at  this time, excluding his back pain, is hypertension for which he is on 3  medications.  He denies having any cardiac complaints.   REVIEW OF SYSTEMS:  GENERAL:  No weight gain or weight loss.  He  currently weighs 250 pounds and is 5 feet 11 inches.  CARDIAC:  Negative.  PULMONARY:  Negative.  GASTROINTESTINAL:  Negative.  GENITOURINARY:  Negative.  VASCULAR:  Negative.  NEURO:  Positive for dizziness, which he states is related to his  antihypertensive medications.  ORTHO:  Positive for back pain.  PSYCH:  Negative.  ENT:  Negative.  HEMATOLOGICAL:  Negative.   PAST MEDICAL HISTORY:  Significant for hypertension and back pain.   PAST SURGICAL HISTORY:  Pilonidal cyst excision, tonsillectomy,  bilateral carpal tunnel release.   FAMILY HISTORY:  Negative for cardiovascular disease.   SOCIAL HISTORY:  He is single.  He has a history of smoking.  He quit 15  months ago.  He had smoked one pack a day.  He does drink 2 to 3 beers  per day.   MEDICATIONS:  Include amlodipine 0.5 mg daily, hydrochlorothiazide 25 mg  daily, lisinopril 20 mg daily.   ALLERGIES:  Ampicillin.   PHYSICAL EXAMINATION:  Vital signs:  Heart rate 76, blood pressure  150/91, respirations 18, O2 saturation 96% on room  air.  General:  He is  well-appearing, in no acute distress.  HEENT:  Normocephalic,  atraumatic.  Pupils are equal.  Sclerae anicteric.  Neck is supple  without JVD.  There are no carotid bruits.  Cardiovascular is regular  rate and rhythm without murmurs, rubs, or gallops.  Pulmonary:  Lungs  are clear to auscultation bilaterally.  Abdomen:  Obese, soft,  nontender.  No hepatosplenomegaly.  The aorta is palpable, but  nontender.  Groins, he has palpable femoral pulses.  His extremities are  warm and well-perfused.  I am unable to palpate pedal pulses.  He has a  mild amount of edema in his leg.  There are no wounds.  Psych:  He is  alert and oriented x3.  Neuro:  Cranial nerves 2-12 grossly intact.  Skin is without bruising or rash.   DIAGNOSTIC STUDIES:  The patient underwent ultrasound today, which was a  difficult study given body habitus.  However, abdominal aortic aneurysm  was found measuring 4.5 cm.   ASSESSMENT AND PLAN:  A 65 year old with asymptomatic abdominal aortic  aneurysm.   PLAN:  Due to the patient's size of his aneurysm, I have recommended  proceeding with CT angiogram to fully delineate the extent  of his  aneurysm.  I will have him follow up once this is done.  I am also going  to obtain carotid duplex studies, as well as lower extremity arterial  studies.  He will follow up with me in 2 weeks.   Jorge Ny, MD  Electronically Signed   VWB/MEDQ  D:  06/19/2007  T:  06/20/2007  Job:  [id number]   cc:   Payton Doughty, M.D.

## 2010-11-03 NOTE — Procedures (Signed)
ENDOVASCULAR STENT GRAFT EXAM   INDICATION:  Followup of an abdominal aortic aneurysm stent repair.   HISTORY:  Endovascular abdominal aortic aneurysm repair April 2009                           DUPLEX EVALUATION   AAA Sac Size:                 4.6 cm CM AP          4.6 cm CM TRV  Previous Sac Size:            4.6 cm CM AP          4.6 cm CM TRV  Evidence of an endoleak?      No   Velocity Criteria:  Proximal Aorta                Not visualized cm/sec  Proximal Stent Graft          107 cm/sec  Main Body Stent Graft-Mid     77 cm/sec  Right Limb-Proximal           108 cm/sec  Right Limb-Distal             92 cm/sec  Left Limb-Proximal            98 cm/sec  Left Limb-Distal              114 cm/sec  Patent Renal Arteries?        Yes                   Yes   IMPRESSION:  1. Patent endovascular repair of abdominal aortic aneurysm with no      evidence of leak, base limited visualization due to bowel gas      patterns.  2. No change in diameter compared to previous study.  3. Ankle-brachial indices are within normal limits bilaterally.   ___________________________________________  V. Charlena Cross, MD   CJ/MEDQ  D:  05/05/2009  T:  05/05/2009  Job:  [id number]

## 2010-11-03 NOTE — Procedures (Signed)
LOWER EXTREMITY ARTERIAL EVALUATION-SINGLE LEVEL   INDICATION:  Evaluation prior to repair of abdominal aortic aneurysm,  diminished pedal pulses.   HISTORY:  Diabetes:  No.  Cardiac:  No.  Hypertension:  Yes.  Smoking:  Quit 15 years ago.  Previous Surgery:  No.   RESTING SYSTOLIC PRESSURES: (ABI)                          RIGHT                LEFT  Brachial:               150                  150  Anterior tibial:        166                  150  Posterior tibial:       164 (greater than 1) 156 (greater than 1)  Peroneal:  DOPPLER WAVEFORM ANALYSIS:  Anterior tibial:        Triphasic            Triphasic  Posterior tibial:       Triphasic            Triphasic  Peroneal:   PREVIOUS ABI'S:  Date:  RIGHT:  LEFT:   IMPRESSION:  No evidence of lower extremity arterial occlusive disease  bilaterally.   ___________________________________________  V. Charlena Cross, MD   MC/MEDQ  D:  07/03/2007  T:  07/04/2007  Job:  [id number]

## 2010-11-03 NOTE — Procedures (Signed)
DUPLEX ULTRASOUND OF ABDOMINAL AORTA   INDICATION:  Possible abdominal aortic aneurysm, per x-ray and MRI for  back.   HISTORY:  Diabetes:  No.  Cardiac:  No.  Hypertension:  Yes.  Smoking:  Quit.  Connective Tissue Disorder:  Family History:  No.  Previous Surgery:  No.   DUPLEX EXAM:         AP (cm)                   TRANSVERSE (cm)  Proximal             1.9 Cm  Mid                  4.53 cm                   4.55 cm  Distal               2.12 cm                   2.22 cm  Right Iliac          1.11 cm  Left Iliac           1.09 cm   PREVIOUS:  Date:  AP:  TRANSVERSE:   IMPRESSION:  1. A technically difficult study due to body habitus and bowel gas.  2. Abdominal aortic aneurysm appears to be infrarenal and measures      4.53 cm X 4.55 cm at its largest diameter.   ___________________________________________  V. Charlena Cross, MD   AS/MEDQ  D:  06/19/2007  T:  06/19/2007  Job:  [id number]

## 2010-11-03 NOTE — Procedures (Signed)
ENDOVASCULAR STENT GRAFT EXAM   INDICATION:  Follow up endovascular repair of abdominal aortic aneurysm.   HISTORY:  Endovascular repair of abdominal aortic aneurysm on 10/11/07.                           DUPLEX EVALUATION   AAA Sac Size:                 4.6 CM AP             4.6 CM TRV  Previous Sac Size:            4.8 CM AP             4.4 CM TRV  Evidence of an endoleak?      No                    No   Velocity Criteria:  Proximal Aorta                Not visualized  Proximal Stent Graft          Not visualized  Main Body Stent Graft-Mid     40 cm/sec  Right Limb-Proximal           102 cm/sec  Right Limb-Distal             104 cm/sec  Left Limb-Proximal            79 cm/sec  Left Limb-Distal              120 cm/sec  Patent Renal Arteries?        Yes                   Yes   IMPRESSION:  1. Patent endovascular repair of the abdominal aortic aneurysm with no      evidence of leak noted, based on limited visualization due to      patient body habitus and overlying bowel gas patterns.  2. No significant change in the maximum diameter of the distal      abdominal aortic aneurysm noted when compared to the previous CT      scan done on 04/29/2008.  3. The right ankle brachial index is 1.18 and the left ankle brachial      index is 1.09.        ___________________________________________  V. Charlena Cross, MD   CH/MEDQ  D:  07/30/2008  T:  07/30/2008  Job:  [id number]

## 2010-11-03 NOTE — Assessment & Plan Note (Signed)
OFFICE VISIT   Chad Huffman, RITCHEY L  DOB:  [birth year]                                       11/10/2009  YNWGN#:[id number]   REASON FOR VISIT:  Followup.   Patient comes back in today for follow-up of his abdominal aortic  aneurysm which was repaired via endovascular venous stent graft on  10/11/2007.  He has not had any problems since I have last seen him.  He  denies abdominal pain.  He denies claudication.  He denies neurologic  symptoms.   PHYSICAL EXAMINATION:  Heart rate 90, blood pressure 124/75, temperature  is 98.4.  Generally, well-appearing in no distress.  Respirations are  nonlabored.  His abdomen is soft, nontender.  No pulsatile mass.  Groin  incisions are well-healed.  Extremities are warm and well-perfused  without ulceration.  Skin is without rash.   DIAGNOSTIC STUDIES:  I have independently reviewed his CT scan today.  This reveals a stable aneurysm measuring 4.1 x 4.0 compared to 4.2 x 4.0  previously.  There is no evidence of endoleak.   Status post endovascular stent graft.  The patient pack continues to do  well.  His aneurysm continues to decrease in size, although it is  somewhat stabilized.  There is no evidence of endoleak.  I will plan on  seeing the patient back in 1 year with a repeat CT scan.  If he  continues to remain stable, I will switch him to yearly ultrasound.  I  am going to repeat his carotid ultrasound, as we have not had followup  with that in 2 years; however, it was minimal disease, initially.     Jorge Ny, MD  Electronically Signed   VWB/MEDQ  D:  11/10/2009  T:  11/11/2009  Job:  [id number]   cc:   Payton Doughty, M.D.

## 2010-11-03 NOTE — Letter (Signed)
Nov 06, 2007    Re:  GREGORY, BARRICK                 DOB:  26-Jun-1945    REASON FOR VISIT:  Followup.   HISTORY:  This is a 65 year old gentleman whom I initially saw at the  request of Dr. Trey Sailors for evaluation of abdominal aortic aneurysm.  His aneurysm measured approximately 5.1 cm.  he underwent vascular  repair on 10/11/2007 using a Cook-Zenith device.  He had an  uncomplicated postoperative course and comes in today for followup.  The  patient states he has been having some back pain, which is a little bit  worse than prior to his operation.  He attributes this to being flat on  the table for his procedure.  He is not having any abdominal pain.  He  has been having regular bowel movements and normal appetite.  He is  ready to return to work in approximately 1 week.   PHYSICAL EXAM:  His blood pressure is 109/68, pulse is 96.  Abdomen is  soft and nontender.  Incisions are well-healed.   DIAGNOSTIC STUDIES:  The patient underwent CT angiogram today that shows  no evidence of endoleak.  His aneurysm sac is stable.   ASSESSMENT AND PLAN:  Status post endovascular aneurysm repair for  asymptomatic greater than 5.1 cm aneurysm.  The patient will be placed  on our protocol.  I will follow up with him in 3 months and write him a  prescription to return to work on May 30th for regular duty.   Jorge Ny, MD  Electronically Signed   VWB/MEDQ  D:  11/06/2007  T:  11/07/2007  Job:  [id number]   cc:   Payton Doughty, M.D.

## 2010-11-03 NOTE — Assessment & Plan Note (Signed)
OFFICE VISIT   Chad Huffman, Chad Huffman  DOB:  1946/02/29                                       01/22/2008  ZOXWR#:[id number]   REASON FOR VISIT:  Followup.   HISTORY:  This is a 65 year old who is status post endovascular aneurysm  repair on 10/11/2007 using a Cook-Zenith device.  His initial CT scan at  3 months revealed no evidence of endoleak.  He comes back in today for  followup.  He has no complaints at this time.   EXAMINATION:  Vital Signs:  Blood pressure 130/72.  His abdomen is soft,  nontender.  He is in no acute distress.  His incisions are well healed.  Legs are warm and well perfused.   DIAGNOSTIC STUDIES:  The patient had arterial duplex today which showed  ankle brachial index of 1.1 on the right and 0.96 on the left.   ASSESSMENT/PLAN:  Status post endovascular aneurysm repair.   PLAN:  The patient will be scheduled to have a CT scan in 3 months to  further evaluate his repair.  He also will have a repeat carotid  ultrasound in 1 year, he has 40-59% stenosis.   Jorge Ny, MD  Electronically Signed   VWB/MEDQ  D:  01/22/2008  T:  01/23/2008  Job:  [id number]   cc:   Dr. Channing Mutters

## 2010-11-03 NOTE — Assessment & Plan Note (Signed)
OFFICE VISIT   Chad Huffman, Chad Huffman  DOB:  09/21/1945                                       07/03/2007  HYQMV#:[id number]   REASON FOR EVALUATION:  Abdominal aortic aneurysm.   HISTORY:  This is a 65 year old gentleman that I recently saw for  evaluation of abdominal aortic aneurysm.  The patient was sent for a CT  scan and comes back today to discuss the results.  He states that he has  not had any change in his pain.  He suffers from chronic back pain.   PHYSICAL EXAMINATION:  Blood pressure is 139/81, heart rate 78,  respirations 18.  GENERAL:  He is well-appearing.  No acute distress.  ABDOMEN:  Soft, nontender.   DIAGNOSTIC DATA:  The patient underwent CT angiogram today.  The  patient's aneurysm is just slightly less than 5 cm.  It is infrarenal in  nature.   ASSESSMENT:  Abdominal aortic aneurysm.   PLAN:  Patient is to get a duplex today of his carotid, as well as his  lower extremities.  I told him that he is on the brink of needing  repair.  I measured him just shy of 5 cm.  For that reason, I have  recommended repeating a CT scan in 3 months.  If his aneurysm has grown  or changed in size at all, I would proceed with repair.  I feel at this  time the patient is an endovascular candidate.  He is going to followup  with me in 3 months following his scan.   Jorge Ny, MD  Electronically Signed   VWB/MEDQ  D:  07/03/2007  T:  07/04/2007  Job:  [id number]   cc:   Payton Doughty, M.D.

## 2010-11-03 NOTE — Assessment & Plan Note (Signed)
OFFICE VISIT   Chad Huffman, Chad Huffman  DOB:  1945/09/11                                       09/18/2007  ZOXWR#:[id number]   REASON FOR VISIT:  Followup abdominal aortic aneurysm.   HISTORY:  This is a 65 year old gentleman whom I initially saw at the  request of Dr. Trey Sailors for evaluation of abdominal aortic aneurysm  found incidentally during imaging of his back.  We have observed this  aneurysm  for the past 3 months.  It has been measuring just shy of 5  cm.  He comes back in today for repeat CT scan.  He denies having any  abdominal pain or new symptoms that could be attributed to his aneurysm.   REVIEW OF SYSTEMS:  GENERAL:  No weight gain or weight loss.  Weight is  250 pounds.  CARDIAC:  Negative.  PULMONARY:  Negative.  GASTROINTESTINAL:  Negative.  GENITOURINARY:  Negative.  VASCULAR:  Negative.  NEURO:  Dizziness.  ORTHO:  Positive for back pain.  PSYCH:  Negative.  ENT:  Negative.  HEMATOLOGICAL:  Negative.   PAST MEDICAL HISTORY:  Significant for hypertension and back pain.   PAST SURGICAL HISTORY:  Pilonidal cyst excision.  Tonsillectomy.  Bilateral carpal tunnel release.   FAMILY HISTORY:  Negative for cardiovascular disease.   SOCIAL HISTORY:  He is single.  History of smoking.  Quit 15 months ago.  He used to smoke a pack a day.  He drinks 2 to 3 beers a day.   MEDICATIONS:  Include amlodipine 0.5 mg per day, hydrochlorothiazide 25  mg per day, lisinopril 20 mg per day.   ALLERGIES:  Ampicillin.   PHYSICAL EXAMINATION:  Vital signs:  Blood pressure 124/78, pulse 80,  respirations 16.  General:  He is well-appearing, in no acute distress.  HEENT:  Normocephalic, atraumatic.  Neck is soft and supple.  Cardiovascular is regular rate and rhythm.  Pulmonary is clear  bilaterally.  Abdomen is soft and nontender.  Extremities are warm and  well-perfused.  Psych:  He is alert and oriented x3.   ASSESSMENT AND PLAN:  Abdominal aortic  aneurysm measuring greater than  5.1 cm.   PLAN:  I had an extensive discussion with the patient regarding  endovascular versus open treatment.  He has decided on proceeding with  endovascular repair.  We discussed risks and benefits of the procedure,  including bleeding, lower extremity embolization, bowel ischemia,  cardiopulmonary complications.  The patient is aware of all these risks  and is willing to proceed.  His surgery is scheduled for April 22, which  is a Wednesday.  I am going to place a Garceno device.  He is going to get  an echocardiogram scheduled prior to his repair.  While he is in the  hospital, he needs to get carotid and lower extremity duplex performed.   Jorge Ny, MD  Electronically Signed   VWB/MEDQ  D:  09/18/2007  T:  09/19/2007  Job:  [id number]   cc:   Payton Doughty, M.D.

## 2010-11-03 NOTE — Assessment & Plan Note (Signed)
OFFICE VISIT   Chad Huffman, Chad Huffman  DOB:  02-03-1946                                       10/28/2008  ZOXWR#:[id number]   REASON FOR VISIT:  Follow up.   HISTORY:  This is a 65 year old gentleman who underwent endovascular  repair of an abdominal aortic aneurysm on October 11, 2007.  He comes back  in today for a follow-up.  He is not having any complaints at this time,  he denies any neurologic symptoms, he denies any abdominal pain.  This  is his 1-year follow-up visit.   PHYSICAL EXAMINATION:  His blood pressure is 123/77, pulse 77.  He is  well-appearing, in no distress.  Abdomen:  Soft, nontender.  Extremities:  Warm and well perfused.  He has a nonfocal neuro exam.  The patient had a CT scan today which reveals no evidence of endoleak,  the aneurysm has decreased in size and now measures 4.1 x 4.2.   ASSESSMENT/PLAN:  Status post endovascular aneurysm repair.   Plan:  The patient will follow up at 6 months with an ultrasound.  I  will see him in 1 year with a repeat abdominal ultrasound as well as  carotid duplex.   Jorge Ny, MD  Electronically Signed   VWB/MEDQ  D:  10/28/2008  T:  10/29/2008  Job:  [id number]   cc:   Payton Doughty, M.D.

## 2010-11-03 NOTE — Op Note (Signed)
NAMETUNIS, GENTLE                 ACCOUNT NO.:  [id number]   MEDICAL RECORD NO.:  [id number]          PATIENT TYPE:  INP   LOCATION:  2550                         FACILITY:  [facility]   PHYSICIAN:  Di Kindle. Edilia Bo, M.D.DATE OF BIRTH:  09-26-1945   DATE OF PROCEDURE:  10/11/2007  DATE OF DISCHARGE:                               OPERATIVE REPORT   PREOPERATIVE DIAGNOSIS:  Abdominal aortic aneurysm.   POSTOPERATIVE DIAGNOSIS:  Abdominal aortic aneurysm.   PROCEDURES:  Exposure of right common femoral artery and closure of  right common femoral artery for placement of endovascular stent graft.   SURGEON:  Di Kindle. Edilia Bo, MD   ASSISTANT:  1. Durene Cal IV, MD   ANESTHESIA:  General.   TECHNIQUE:  This patient had an endovascular stent graft placed by Dr.  Myra Gianotti which is dictated separately.  I provided exposure of the right  common femoral artery and closure of the right common femoral artery and  assisted on the procedure.  The groins and abdomen were prepped and  draped in usual sterile fashion.  An oblique incision was made below the  inguinal ligament and quite deep in the wound, the common femoral artery  was exposed and controlled with a blue vessel loop.  It was looped  proximally and distally.  The artery had been cannulated and a guidewire  introduced for placement of the iliac limb of the endovascular stent  graft component.  At completion of the procedure, the arteriotomy was  extended slightly after the sheath was removed and the artery clamped  proximally and distally.  The artery was closed with a running 5-0  Prolene suture.  The deep layer was closed with a deep layer of 2-0  Vicryl.  The subcutaneous layer was closed with 3-0 Vicryl and the skin  closed with a 4-0 subcuticular stitch.  Sterile dressing was applied.  The patient tolerated the procedure well.  He was transferred to the  recovery room in satisfactory condition.  All needle and sponge  counts  were correct.      Di Kindle. Edilia Bo, M.D.  Electronically Signed     CSD/MEDQ  D:  10/11/2007  T:  10/12/2007  Job:  [id number]

## 2010-11-03 NOTE — Assessment & Plan Note (Signed)
OFFICE VISIT   Chad Huffman, Chad Huffman  DOB:  [birth year]                                       04/29/2008  ZOXWR#:[id number]   REASON FOR VISIT:  Follow-up aneurysm.   HISTORY:  This is a 65 year old gentleman who is status post  endovascular aneurysm exclusion using a Cook Zenith device on  10/11/2007.  His initial CT scan at 3 months revealed no evidence of  endoleak.  He comes back in today for follow-up, he is without  complaints.  He had a CT scan earlier today.  Again, he denies having  any abdominal pain.  He denies having any fevers or chills.  He does not  have any nausea, vomiting.   PHYSICAL EXAMINATION:  Blood pressure 137/81, pulse 85, temperature is  98.4.  General:  He is well-appearing, no distress.  Cardiovascular:  Regular rate and rhythm.  Neck:  Supple.  No JVD.  No carotid bruits.  Abdomen:  Soft, nontender.  No pulsatile mass.  Extremities:  Warm and  well-perfused.  Incisions are well-healed.   DIAGNOSTIC STUDIES:  The patient underwent a CT angiogram today that  shows no evidence of endoleak.  His aneurysm has decreased in size.  It  now measures 4.8 cm x 4.1 cm.  Again there is no evidence of endoleak.   ASSESSMENT/PLAN:  Status post endovascular aneurysm exclusion.   Plan:  The patient is doing well at this time.  His aneurysm is  decreasing nicely.  He has no evidence of endoleak by CT scan.  I am  going to have him come back in 3 months for an ultrasound surveillance  study.  He will undergo another CT scan in 6 months.  He will see me in  6 months after a CT scan.  I am also going to duplex his carotid  arteries as he had some mild carotid stenosis at his initial scan.   Jorge Ny, MD  Electronically Signed   VWB/MEDQ  D:  04/29/2008  T:  04/30/2008  Job:  [id number]   cc:   Payton Doughty, M.D.

## 2010-11-03 NOTE — Procedures (Signed)
CAROTID DUPLEX EXAM   INDICATION:  Cerebrovascular evaluation prior to repair of abdominal  aortic aneurysm.   HISTORY:  Diabetes:  No.  Cardiac:  No.  Hypertension:  Yes.  Smoking:  Quit 15 months ago.  Previous Surgery:  No.  CV History:  Patient reports no cerebrovascular symptoms at this time.  Amaurosis Fugax:  No, Paresthesias:  No, Hemiparesis:  No                                       RIGHT             LEFT  Brachial systolic pressure:         150               150  Brachial Doppler waveforms:         Triphasic         Triphasic  Vertebral direction of flow:        Antegrade         Antegrade  DUPLEX VELOCITIES (cm/sec)  CCA peak systolic                   114               79  ECA peak systolic                   165               346  ICA peak systolic                   119               108  ICA end diastolic                   60                50  PLAQUE MORPHOLOGY:                  Calcified         Soft plaque  PLAQUE AMOUNT:                      Mild              Mild  PLAQUE LOCATION:                    Proximal ICA      Proximal ICA   IMPRESSION:  1. 40% to 59% right internal carotid artery stenosis.  2. 20% to 39% left internal carotid artery stenosis.  3. Bilateral external carotid artery stenosis, left worse than right.   ___________________________________________  V. Charlena Cross, MD   MC/MEDQ  D:  07/03/2007  T:  07/04/2007  Job:  [id number]

## 2010-11-03 NOTE — Procedures (Signed)
CAROTID DUPLEX EXAM   INDICATION:  Follow up mild carotid disease.   HISTORY:  Diabetes:  No.  Cardiac:  No.  Hypertension:  Yes.  Smoking:  Previous.  Previous Surgery:  Endovascular stent repair of abdominal aortic  aneurysm on 10/11/2007.  CV History:  Asymptomatic.  Amaurosis Fugax No, Paresthesias No, Hemiparesis No.                                       RIGHT             LEFT  Brachial systolic pressure:         130               136  Brachial Doppler waveforms:         Normal            Normal  Vertebral direction of flow:        Antegrade         Antegrade  DUPLEX VELOCITIES (cm/sec)  CCA peak systolic                   98                115  ECA peak systolic                   79                241  ICA peak systolic                   68                76  ICA end diastolic                   28                37  PLAQUE MORPHOLOGY:                  Mixed             Mixed  PLAQUE AMOUNT:                      Mild              Mild  PLAQUE LOCATION:                    ICA/ECA           ICA/ECA   IMPRESSION:  1. 1-39% stenosis of the bilateral internal carotid arteries noted.  2. The Doppler velocities of the right internal carotid artery appear      less than previously recorded when compared to the examination on      07/03/07 with the left internal carotid artery velocities remaining      stable.  3. A technically difficult study due to vessel depth and vessel motion      during respiration.   ___________________________________________  V. Charlena Cross, MD   CH/MEDQ  D:  07/30/2008  T:  07/30/2008  Job:  [id number]

## 2010-11-20 ENCOUNTER — Other Ambulatory Visit: Payer: Self-pay | Admitting: Neurosurgery

## 2010-11-20 DIAGNOSIS — M79603 Pain in arm, unspecified: Secondary | ICD-10-CM

## 2010-11-23 ENCOUNTER — Ambulatory Visit
Admission: RE | Admit: 2010-11-23 | Discharge: 2010-11-23 | Disposition: A | Discharge status: Home / Self Care | Payer: Private Health Insurance - Indemnity | Source: Ambulatory Visit | Attending: Neurosurgery | Admitting: Neurosurgery

## 2010-11-23 ENCOUNTER — Other Ambulatory Visit: Payer: Private Health Insurance - Indemnity

## 2010-11-23 ENCOUNTER — Ambulatory Visit: Payer: Private Health Insurance - Indemnity | Admitting: Surgery

## 2010-11-23 DIAGNOSIS — M79603 Pain in arm, unspecified: Secondary | ICD-10-CM

## 2010-11-30 ENCOUNTER — Ambulatory Visit
Admission: RE | Admit: 2010-11-30 | Discharge: 2010-11-30 | Disposition: A | Discharge status: Home / Self Care | Payer: Private Health Insurance - Indemnity | Source: Ambulatory Visit | Attending: Surgery | Admitting: Surgery

## 2010-11-30 ENCOUNTER — Other Ambulatory Visit: Payer: Private Health Insurance - Indemnity

## 2010-11-30 ENCOUNTER — Ambulatory Visit: Payer: Private Health Insurance - Indemnity | Admitting: Surgery

## 2010-11-30 DIAGNOSIS — I714 Abdominal aortic aneurysm, without rupture: Secondary | ICD-10-CM

## 2010-11-30 MED ORDER — IOHEXOL 350 MG/ML SOLN
100.0000 mL | Freq: Once | INTRAVENOUS | Status: AC | PRN
  Administered 2010-11-30: 100 mL via INTRAVENOUS

## 2010-12-01 ENCOUNTER — Other Ambulatory Visit: Payer: Private Health Insurance - Indemnity

## 2010-12-09 ENCOUNTER — Other Ambulatory Visit: Payer: Private Health Insurance - Indemnity

## 2010-12-09 ENCOUNTER — Other Ambulatory Visit (INDEPENDENT_AMBULATORY_CARE_PROVIDER_SITE_OTHER): Payer: Private Health Insurance - Indemnity

## 2010-12-09 ENCOUNTER — Ambulatory Visit (INDEPENDENT_AMBULATORY_CARE_PROVIDER_SITE_OTHER): Payer: Private Health Insurance - Indemnity

## 2010-12-09 DIAGNOSIS — I6529 Occlusion and stenosis of unspecified carotid artery: Secondary | ICD-10-CM

## 2010-12-09 DIAGNOSIS — I714 Abdominal aortic aneurysm, without rupture: Secondary | ICD-10-CM

## 2010-12-09 NOTE — Assessment & Plan Note (Signed)
OFFICE VISIT  Chad Huffman, Chad Huffman DOB:  01-Nov-1945                                       12/09/2010 ZOXWR#:[id number]  CHIEF COMPLAINT:  This is a follow-up visit for placement of a stent graft in the abdominal aorta.  He also is being followed up for carotid stenosis.  Patient is a 65 year old gentleman who had an endovascular repair using a Cook Zenith flexed device.  He has been doing well.  He denies any abdominal pain, any pain in his legs related to this issue.  He denies any symptoms of stroke, TIA, amaurosis fugax, or aphasia.  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  He is presently using a walker for stabilization.  The patient had a CTA of the abdomen on 11/30/2010 which showed that the AAA stent was in an unchanged position.  His native aneurysm was now 39 x 39 mm.  It was 41 x 40 mm in the last scan.  There is no evidence of endoleak.  It did show that he had multiple colon diverticula, and there was a small lesion on his lung which was unchanged and thought to be benign.  He also had a duplex scan of his carotids today, which showed a 1% to 39% bilateral carotid stenosis with a tortuous right internal carotid artery.  On physical exam, this is a well-developed, well-nourished gentleman in no acute distress.  He does have an antalgic gait for which he is using a walker.  His bilateral upper and lower extremities are well-perfused. His abdomen is soft and nontender.  His heart rate was 80.  His sats are 100%.  His respiratory rate was 12.  Since his CAT scans have remained stable, we will switch him to yearly duplex scans of the abdomen, and we will continue to follow his carotid arteries on a yearly basis, per protocol.  Della Goo,  PA-C  Quita Skye. Hart Rochester, M.D. Electronically Signed  RR/MEDQ  D:  12/09/2010  T:  12/09/2010  Job:  [id number]

## 2010-12-24 NOTE — Procedures (Unsigned)
CAROTID DUPLEX EXAM  INDICATION:  Follow up carotid disease.  HISTORY: Diabetes:  No. Cardiac: Hypertension:  Yes. Smoking:  Previous. Previous Surgery:  AAA repair. CV History: Amaurosis Fugax No, Paresthesias No, Hemiparesis No.                                      RIGHT             LEFT Brachial systolic pressure:         122               130 Brachial Doppler waveforms:         WNL               WNL Vertebral direction of flow:        NV                Antegrade DUPLEX VELOCITIES (cm/sec) CCA peak systolic                   76                68 ECA peak systolic                   75                159 ICA peak systolic                   58                61 ICA end diastolic                   24                27 PLAQUE MORPHOLOGY:                  Heterogenous      Heterogenous PLAQUE AMOUNT:                      Mild              Minimal PLAQUE LOCATION:                    CCA/ECA/ICA       CCA/ECA/ICA  IMPRESSION: 1. 1% to 39% bilateral internal carotid artery stenosis, right greater     than left. 2. Tortuous right internal carotid artery. 3. Right vertebral artery could not be visualized; left vertebral     artery is within normal limits.  ___________________________________________ V. Charlena Cross, MD  LT/MEDQ  D:  12/09/2010  T:  12/09/2010  Job:  [id number]

## 2011-02-01 ENCOUNTER — Emergency Department: Payer: Self-pay | Admitting: Emergency Medicine

## 2011-03-16 LAB — CBC
HCT: 40.6
Hemoglobin: 12 — ABNORMAL LOW
Hemoglobin: 13.8
MCHC: 35.1
MCHC: 35.3
Platelets: 137 — ABNORMAL LOW
Platelets: 143 — ABNORMAL LOW
RBC: 4.37
RDW: 12.1
RDW: 12.5
RDW: 12.5
WBC: 8

## 2011-03-16 LAB — COMPREHENSIVE METABOLIC PANEL
ALT: 16
ALT: 17
Alkaline Phosphatase: 58
BUN: 25 — ABNORMAL HIGH
Calcium: 8.2 — ABNORMAL LOW
Chloride: 102
GFR calc Af Amer: >60
Glucose, Bld: 110 — ABNORMAL HIGH
Glucose, Bld: 128 — ABNORMAL HIGH
Potassium: 4.3
Sodium: 133 — ABNORMAL LOW
Sodium: 137
Total Bilirubin: 0.8
Total Protein: 5.3 — ABNORMAL LOW
Total Protein: 6.5

## 2011-03-16 LAB — URINALYSIS, ROUTINE W REFLEX MICROSCOPIC
Glucose, UA: NEGATIVE
Hgb urine dipstick: NEGATIVE
Specific Gravity, Urine: 1.023

## 2011-03-16 LAB — TYPE AND SCREEN
ABO/RH(D): A NEG
Antibody Screen: NEGATIVE

## 2011-03-16 LAB — BASIC METABOLIC PANEL
BUN: 16
CO2: 27
Calcium: 8.3 — ABNORMAL LOW
Creatinine, Ser: 1.03
Glucose, Bld: 148 — ABNORMAL HIGH

## 2011-03-16 LAB — BLOOD GAS, ARTERIAL
Bicarbonate: 29.2 — ABNORMAL HIGH
FIO2: 0.21
Patient temperature: 98.6
TCO2: 30.7
pCO2 arterial: 49.3 — ABNORMAL HIGH
pH, Arterial: 7.39
pO2, Arterial: 63.8 — ABNORMAL LOW

## 2011-03-16 LAB — MAGNESIUM: Magnesium: 2

## 2011-03-16 LAB — PROTIME-INR
INR: 1.1
Prothrombin Time: 13.5
Prothrombin Time: 14.6

## 2011-03-16 LAB — URINE MICROSCOPIC-ADD ON

## 2011-04-08 ENCOUNTER — Encounter: Payer: Self-pay | Admitting: Family Medicine

## 2011-04-19 ENCOUNTER — Inpatient Hospital Stay: Payer: Self-pay | Admitting: Student

## 2011-04-19 ENCOUNTER — Ambulatory Visit: Payer: Self-pay | Admitting: Internal Medicine

## 2011-04-19 DIAGNOSIS — R0602 Shortness of breath: Secondary | ICD-10-CM

## 2011-04-22 ENCOUNTER — Encounter: Payer: Self-pay | Admitting: Family Medicine

## 2011-05-22 ENCOUNTER — Encounter: Payer: Self-pay | Admitting: Family Medicine

## 2011-06-22 ENCOUNTER — Encounter: Payer: Self-pay | Admitting: Family Medicine

## 2011-07-12 IMAGING — CT CT ANGIO ABDOMEN
2 of 8 series · 13 of 46 positions shown, 18 images · IV contrast ([ID] OMNI [id number])
Comparison: CT angio abdomen pelvis of 10/28/2008

CTA ABDOMEN

CLINICAL DATA: Post AAA stent, follow-up

CT ANGIOGRAPHY ABDOMEN AND PELVIS
TECHNIQUE: Multidetector CT imaging of the abdomen and pelvis was
performed using the standard protocol during bolus administration
of intravenous contrast.  Multiplanar reconstructed images
including MIPs were obtained and reviewed to evaluate the vascular
anatomy.
Contrast:  150s ccs [id number]-[id number]

[Series 4: arterial, venous · axial · arterial · 0.86mm/px · z∈[-408,+25]mm · 11 of 279 slices shown, 15 images]
[im 28/279  soft-tissue]
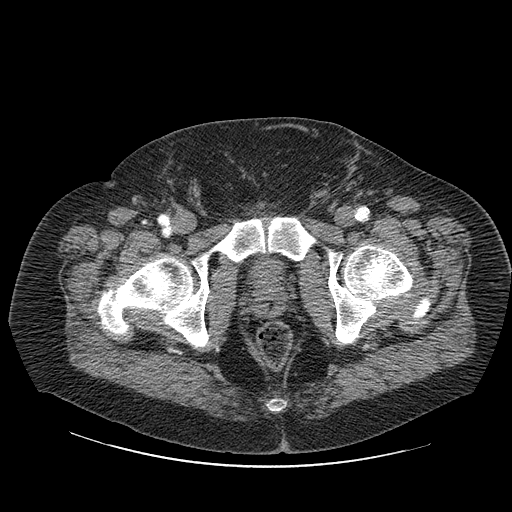
[im 28/279  bone]
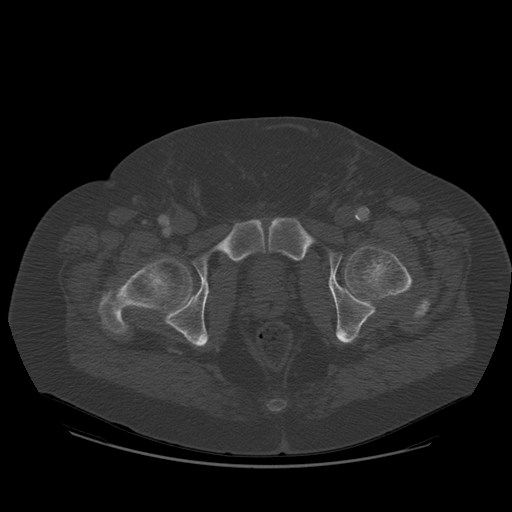
[im 56/279  soft-tissue]
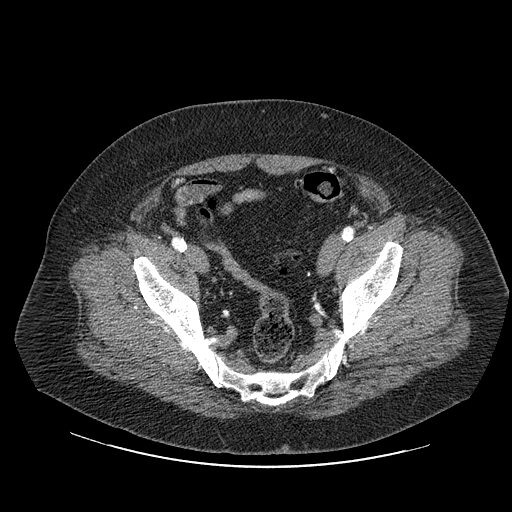
[im 84/279  soft-tissue]
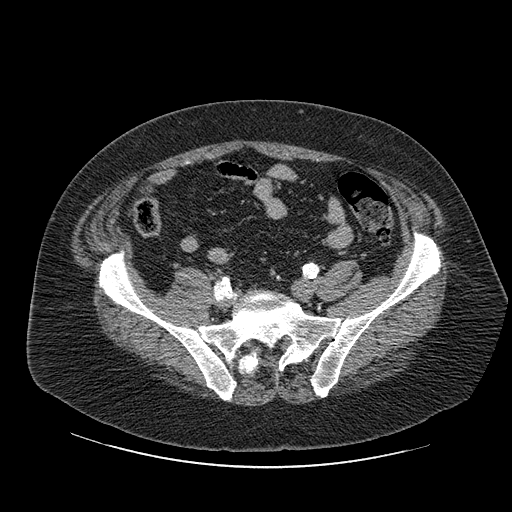
[im 112/279  soft-tissue]
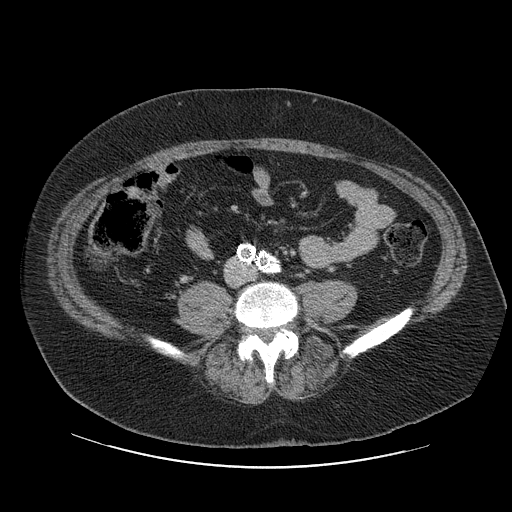
[im 140/279  soft-tissue]
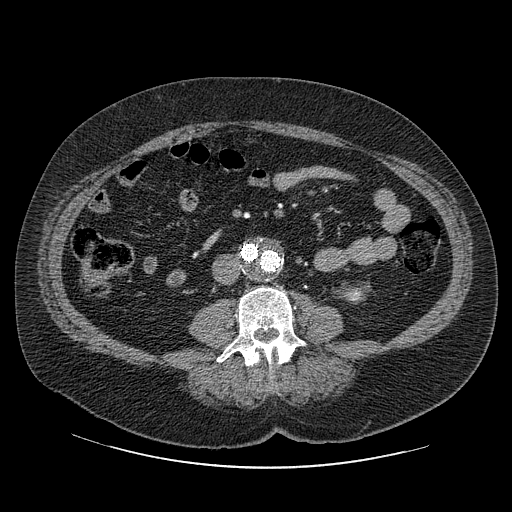
[im 167/279  soft-tissue]
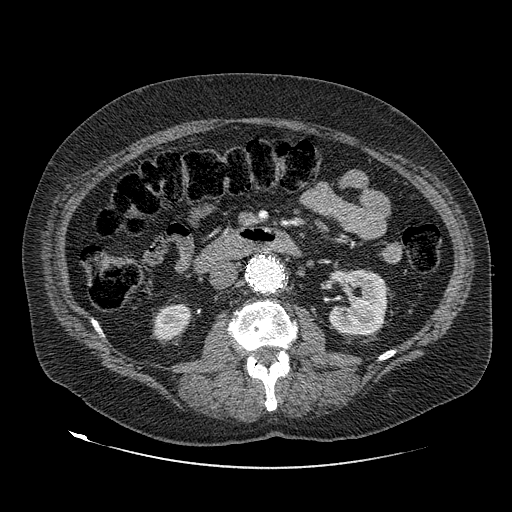
[im 195/279  soft-tissue]
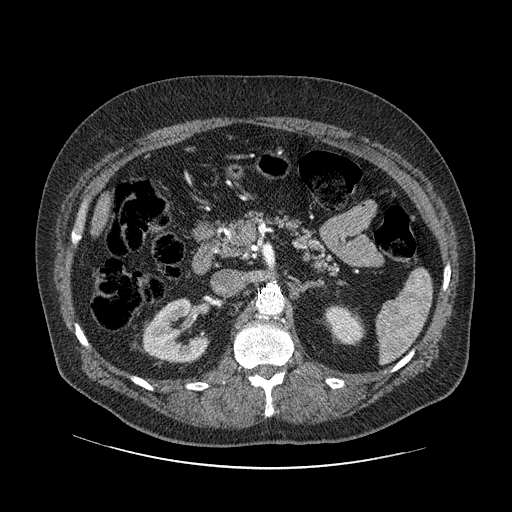
[im 223/279  soft-tissue]
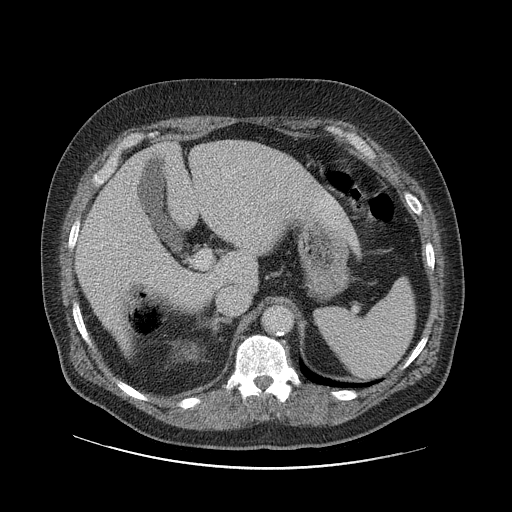
[im 223/279  lung]
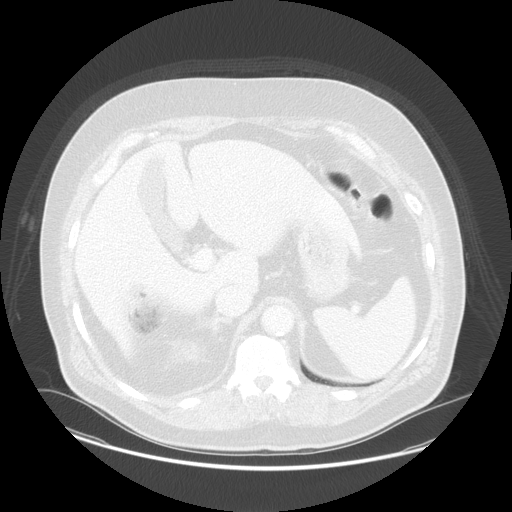
[im 237/279  lung]
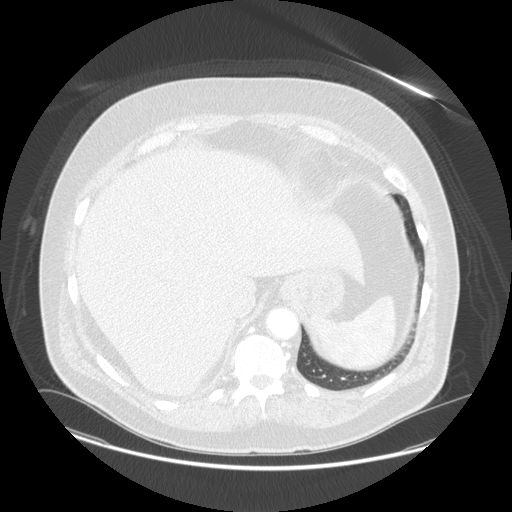
[im 251/279  soft-tissue]
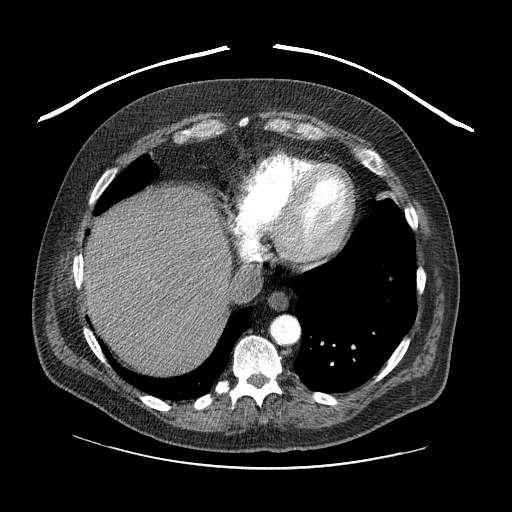
[im 251/279  lung]
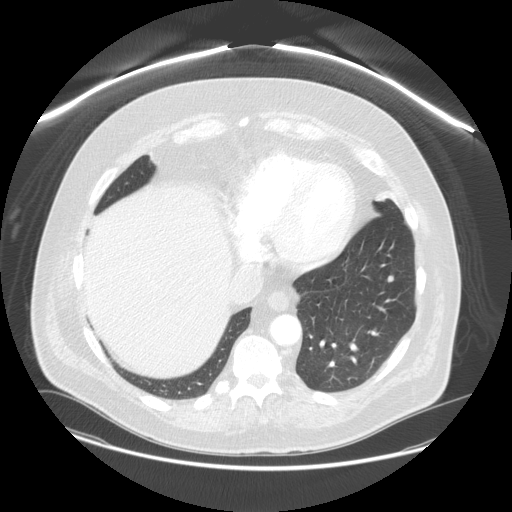
[im 251/279  bone]
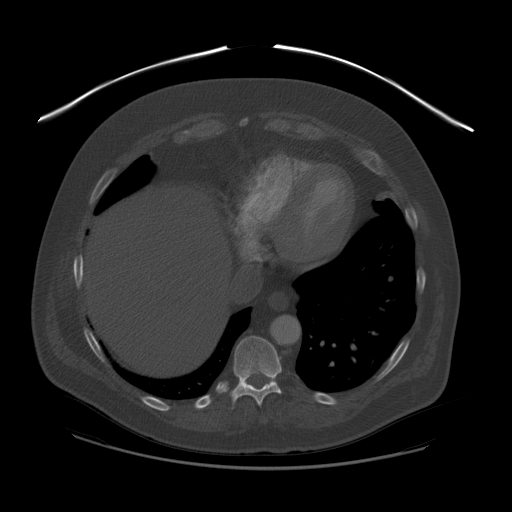
[im 265/279  lung]
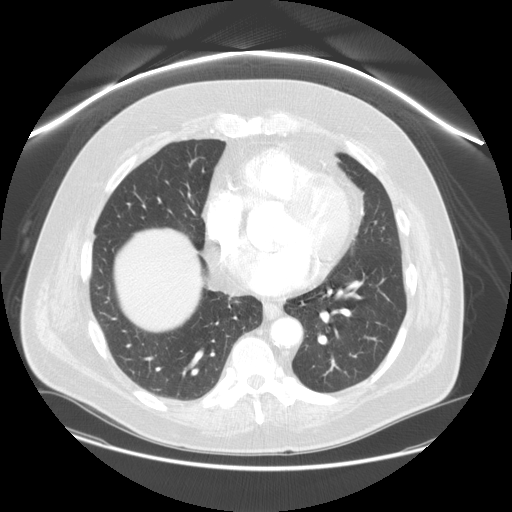

[Series 600: coronal · coronal · 1.07mm/px · 2 of 145 slices shown, 3 images]
[im 49/145  soft-tissue]
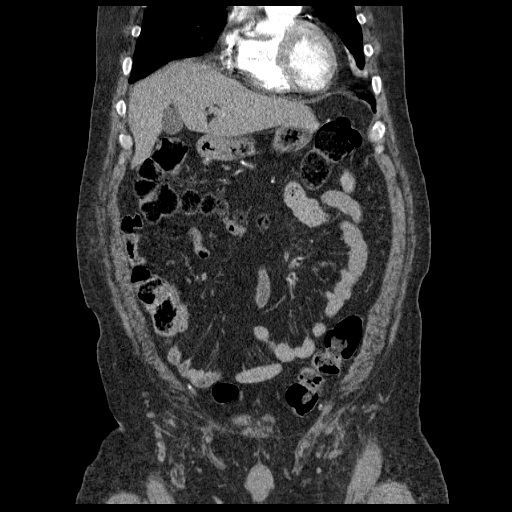
[im 49/145  bone]
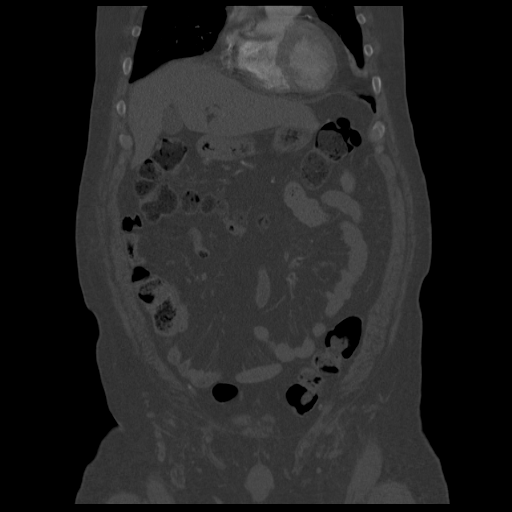
[im 97/145  soft-tissue]
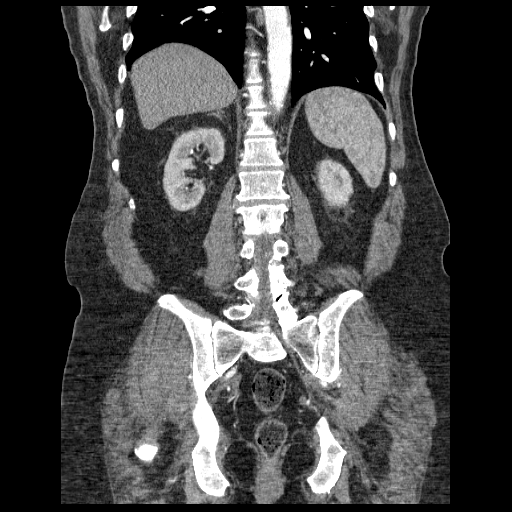

[13 of 46 positions shown; findings below may reference images not displayed]

FINDINGS: The AAA stent is unchanged in position, extending to a
point near the renal artery origins and just below the origin of
the SMA.  The lumen of the stent opacifies.  On delayed images
there is no evidence of endovascular leak.  The aneurysm is stable
in size measuring 41 x 40 mm compared to 42 x 40 mm previously.

The lung bases are clear.  The liver enhances with no focal
abnormality and no ductal dilatation is seen.  The celiac axis and
SMA opacify with no significant abnormality and the renal arteries
opacify as well.  No calcified gallstones are seen.  The pancreas
is stable in size and the pancreatic duct is not dilated.  The
adrenal glands and spleen are unremarkable.  The kidneys enhance
with no significant abnormality noted.

 Review of the MIP images confirms the above findings.
IMPRESSION: 1.  Stable appearance of AAA stent.  No evidence of endovascular
leak.
2.  Stable native AAA measuring 41 x 40 mm.

CTA PELVIS
FINDINGS: The iliac limbs of the AAA stent opacify well, and there
is no evidence of endovascular leak is seen.  The internal and
external iliac arteries opacify with moderate atheromatous change
noted diffusely.  Rectosigmoid colonic diverticula again are noted.
The terminal ileum is unremarkable.  No fluid is seen within the
pelvis.

 Review of the MIP images confirms the above findings.
IMPRESSION: 1.  The iliac limbs opacify with no evidence of endovascular leak.
Moderate atheromatous change is noted throughout the external and
internal iliac arteries.
2.  Multiple rectosigmoid colonic diverticula.

## 2011-08-09 ENCOUNTER — Other Ambulatory Visit: Payer: Private Health Insurance - Indemnity

## 2011-08-09 ENCOUNTER — Encounter: Payer: Private Health Insurance - Indemnity | Admitting: Surgery

## 2011-08-30 ENCOUNTER — Encounter: Payer: Self-pay | Admitting: Vascular Surgery

## 2011-09-03 ENCOUNTER — Encounter: Payer: Self-pay | Admitting: Surgery

## 2011-09-06 ENCOUNTER — Ambulatory Visit (INDEPENDENT_AMBULATORY_CARE_PROVIDER_SITE_OTHER): Payer: Private Health Insurance - Indemnity | Admitting: *Deleted

## 2011-09-06 ENCOUNTER — Ambulatory Visit (INDEPENDENT_AMBULATORY_CARE_PROVIDER_SITE_OTHER): Payer: Private Health Insurance - Indemnity | Admitting: Surgery

## 2011-09-06 ENCOUNTER — Encounter: Payer: Self-pay | Admitting: Surgery

## 2011-09-06 ENCOUNTER — Encounter (INDEPENDENT_AMBULATORY_CARE_PROVIDER_SITE_OTHER): Payer: Private Health Insurance - Indemnity | Admitting: *Deleted

## 2011-09-06 VITALS — BP 134/73 | HR 75 | Resp 18 | Ht 69.5 in | Wt 223.0 lb

## 2011-09-06 DIAGNOSIS — M7989 Other specified soft tissue disorders: Secondary | ICD-10-CM

## 2011-09-06 DIAGNOSIS — R0989 Other specified symptoms and signs involving the circulatory and respiratory systems: Secondary | ICD-10-CM

## 2011-09-06 DIAGNOSIS — R609 Edema, unspecified: Secondary | ICD-10-CM

## 2011-09-06 NOTE — Progress Notes (Signed)
[facility] [place]   Patient name: Chad Huffman MRN: [id number] DOB: 1946/05/15 Sex: male   Referred by: Arbutus Ped  Reason for referral:  Chief Complaint  Patient presents with  . Leg Swelling    REF-->> Dr. Al Corpus,  RLE edema, decreased pulses    HISTORY OF PRESENT ILLNESS: The patient is referred to me today for evaluation of right leg swelling. He is well known to me for having undergone endovascular aneurysm repair. I am also following him for his carotid occlusive disease. The patient states that he's been having problems with swelling in his right leg beginning in July of 2012 following a neck operation. He states that the swelling is worse with when he is on his feet all day. 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 The swelling goes up to just above his ankle. His swelling does appear to be improved with elevation. He has tried to wear TED hose as well as compression stockings however he has trouble with the tightness of these devices. He is scheduled to have additional surgery of his back to correct a spinal stenosis in the future.  Past Medical History  Diagnosis Date  . Antalgic gait   . Hypertension   . Lumbago     Past Surgical History  Procedure Date  . Tonsillectomy   . Carpal tunnel release     bilateral CTS  . Abdominal aortic aneurysm repair 2009    Endovascular AAA repair  . Pilonidal cyst / sinus excision   . Spine surgery 2012    Cervical Fusion by Dr. Raynald Kemp [facility])    History   Social History  . Marital Status: Single    Spouse Name: N/A    Number of Children: N/A  . Years of Education: N/A   Occupational History  . Not on file.   Social History Main Topics  . Smoking status: Former Smoker    Quit date: 08/30/2006  . Smokeless tobacco: Not on file  . Alcohol Use: 1.2 oz/week    2 Cans of beer per week     occasional use  . Drug Use: No  . Sexually Active:    Other Topics  Concern  . Not on file   Social History Narrative  . No narrative on file    No family history on file.  Allergies as of 09/06/2011 - Review Complete 09/06/2011  Allergen Reaction Noted  . Ampicillin  08/30/2011    Current Outpatient Prescriptions on File Prior to Visit  Medication Sig Dispense Refill  . amLODipine (NORVASC) 5 MG tablet Take 5 mg by mouth daily.      Marland Kitchen HYDROcodone-acetaminophen (NORCO) 5-325 MG per tablet Take 1 tablet by mouth every 6 (six) hours as needed.         REVIEW OF SYSTEMS: Cardiovascular: No chest pain, chest pressure, palpitations, orthopnea, or dyspnea on exertion. No claudication or rest pain,  No history of DVT or phlebitis. Pulmonary: No productive cough, asthma or wheezing. Neurologic: No weakness, paresthesias, aphasia, or amaurosis. No dizziness. Hematologic: No bleeding problems or clotting disorders. Musculoskeletal: No joint pain or joint swelling. Gastrointestinal: No blood in stool or hematemesis Genitourinary: No dysuria or hematuria. Psychiatric:: No history of major depression. Integumentary: No rashes or ulcers. Constitutional: No fever or chills.  PHYSICAL EXAMINATION: General: The patient appears their stated age.  Vital signs are BP 134/73  Pulse 75  Resp 18  Ht 5'  9.5" (1.765 m)  Wt 223 lb (101.152 kg)  BMI 32.46 kg/m2  SpO2 97% HEENT:  No gross abnormalities Pulmonary: Respirations are non-labored Musculoskeletal: There are no major deformities.   Neurologic: No focal weakness or paresthesias are detected, Skin: There are no ulcer or rashes noted. Psychiatric: The patient has normal affect. Cardiovascular:  Faintly palpable pedal pulses  Diagnostic Studies: ABIs were obtained today. These are within normal limits. 1.0 on the right 1.0 on the left with triphasic waveforms. Venous ultrasound was performed today. There was no evidence of deep or superficial thrombosis. There was also no significant reflux within the  deep or superficial veins.    Assessment:  Right leg swelling Plan: Most likely the patient's swelling represents lymphedema. This could also be inflammatory secondary to his right foot arthritis. I have recommended compression stockings however the patient states that these appear to cut his circulation often has difficulty putting them on. I've encouraged him to keep his leg elevated. I recommended evaluation by a lymphedema therapist however he would like to get his foot arthritis problems corrected and see if his situation improves. I think that is a reasonable solution. He'll contact me regarding this problem on a when necessary basis.  The patient is scheduled to come back for followup abdominal and carotid ultrasound within the next 3-4 months.     Jorge Ny, M.D. [facility] [place] Office: [phone] Pager:  [phone]

## 2011-09-15 HISTORY — PX: SPINE SURGERY: SHX786

## 2011-09-27 NOTE — Procedures (Unsigned)
DUPLEX DEEP VENOUS EXAM - LOWER EXTREMITY  INDICATION:  Right lower extremity edema.  HISTORY:  Edema:  Yes. Trauma/Surgery:  Status post spinal surgery. Pain:  No. PE:  No. Previous DVT:  No. Anticoagulants: Other:  DUPLEX EXAM:               CFV   SFV   PopV  PTV    GSV               R  L  R  L  R  L  R   L  R  L Thrombosis    0  0  0     0     0      0 Spontaneous   +  +  +     +     +      + Phasic        +  +  +     +     +      + Augmentation  +  +  +     +     +      + Compressible  +  +  +     +     +      + Competent     +  +  +     +     +      +  Legend:  + - yes  o - no  p - partial  D - decreased  IMPRESSION: 1. No evidence of deep vein thrombosis or superficial venous thrombus     in the right lower extremity. 2. No significant reflux in the deep or superficial veins is observed.   _____________________________ V. Charlena Cross, MD  LT/MEDQ  D:  09/08/2011  T:  09/08/2011  Job:  [id number]

## 2011-10-18 ENCOUNTER — Encounter: Payer: Self-pay | Admitting: Orthopedic Surgery

## 2011-10-20 ENCOUNTER — Encounter: Payer: Self-pay | Admitting: Orthopedic Surgery

## 2011-11-16 ENCOUNTER — Other Ambulatory Visit: Payer: Self-pay | Admitting: Podiatry

## 2011-11-20 ENCOUNTER — Encounter: Payer: Self-pay | Admitting: Orthopedic Surgery

## 2011-12-02 ENCOUNTER — Encounter: Payer: Self-pay | Admitting: Neurosurgery

## 2011-12-03 ENCOUNTER — Encounter: Payer: Self-pay | Admitting: Neurosurgery

## 2011-12-03 ENCOUNTER — Other Ambulatory Visit (INDEPENDENT_AMBULATORY_CARE_PROVIDER_SITE_OTHER): Payer: Private Health Insurance - Indemnity | Admitting: *Deleted

## 2011-12-03 ENCOUNTER — Ambulatory Visit (INDEPENDENT_AMBULATORY_CARE_PROVIDER_SITE_OTHER): Payer: Private Health Insurance - Indemnity | Admitting: *Deleted

## 2011-12-03 ENCOUNTER — Ambulatory Visit (INDEPENDENT_AMBULATORY_CARE_PROVIDER_SITE_OTHER): Payer: Private Health Insurance - Indemnity | Admitting: Neurosurgery

## 2011-12-03 VITALS — BP 96/68 | HR 71 | Resp 12 | Ht 69.5 in | Wt 221.4 lb

## 2011-12-03 DIAGNOSIS — I714 Abdominal aortic aneurysm, without rupture, unspecified: Secondary | ICD-10-CM | POA: Insufficient documentation

## 2011-12-03 DIAGNOSIS — I6529 Occlusion and stenosis of unspecified carotid artery: Secondary | ICD-10-CM

## 2011-12-03 DIAGNOSIS — Z48812 Encounter for surgical aftercare following surgery on the circulatory system: Secondary | ICD-10-CM

## 2011-12-03 NOTE — Progress Notes (Signed)
[facility] [place] HISTORY AND PHYSICAL   CC: Annual carotid and AAA duplex Referring Physician: Brabham  History of Present Illness: 66 year old male patient of Dr. Myra Gianotti is status post a AAA stent repair in 2009. Patient's also followed for carotid stenosis. Patient has had recent cervical spine surgery and is still under the care of a surgeon who is not known to me. Patient reports no abdominal or back pain, no signs of CVA, TIA, amaurosis fugax or any neural deficit.  Past Medical History  Diagnosis Date  . Antalgic gait   . Hypertension   . Lumbago     ROS: [x]  Positive   [ ]  Denies    General: [ ]  Weight loss, [ ]  Fever, [ ]  chills Neurologic: [ ]  Dizziness, [ ]  Blackouts, [ ]  Seizure [ ]  Stroke, [ ]  "Mini stroke", [ ]  Slurred speech, [ ]  Temporary blindness; [ ]  weakness in arms or legs, [ ]  Hoarseness Cardiac: [ ]  Chest pain/pressure, [ ]  Shortness of breath at rest [ ]  Shortness of breath with exertion, [ ]  Atrial fibrillation or irregular heartbeat Vascular: [ ]  Pain in legs with walking, [ ]  Pain in legs at rest, [ ]  Pain in legs at night,  [ ]  Non-healing ulcer, [ ]  Blood clot in vein/DVT,   Pulmonary: [ ]  Home oxygen, [ ]  Productive cough, [ ]  Coughing up blood, [ ]  Asthma,  [ ]  Wheezing Musculoskeletal:  [ ]  Arthritis, [ ]  Low back pain, [ ]  Joint pain Hematologic: [ ]  Easy Bruising, [ ]  Anemia; [ ]  Hepatitis Gastrointestinal: [ ]  Blood in stool, [ ]  Gastroesophageal Reflux/heartburn, [ ]  Trouble swallowing Urinary: [ ]  chronic Kidney disease, [ ]  on HD - [ ]  MWF or [ ]  TTHS, [ ]  Burning with urination, [ ]  Difficulty urinating Skin: [ ]  Rashes, [ ]  Wounds Psychological: [ ]  Anxiety, [ ]  Depression   Social History History  Substance Use Topics  . Smoking status: Former Smoker    Quit date: 08/30/2006  . Smokeless tobacco: Not on file  . Alcohol Use: 1.2 oz/week    2 Cans of beer per week     occasional use    Family History History  reviewed. No pertinent family history.  Allergies  Allergen Reactions  . Ampicillin Swelling    Current Outpatient Prescriptions  Medication Sig Dispense Refill  . ciprofloxacin (CIPRO) 500 MG tablet Take 500 mg by mouth Daily.      Marland Kitchen desonide (DESOWEN) 0.05 % cream Apply 0.05 % topically as needed.      Marland Kitchen FLUOCINOLONE ACETONIDE BODY 0.01 % external oil Apply 0.01 % topically as needed.      . ALPRAZolam (XANAX) 1 MG tablet 2 (two) times daily.      Marland Kitchen amLODipine (NORVASC) 5 MG tablet Take 5 mg by mouth daily.      . baclofen (LIORESAL) 10 MG tablet       . bisacodyl (DULCOLAX) 5 MG EC tablet Take 5 mg by mouth daily as needed.      . furosemide (LASIX) 20 MG tablet       . HYDROcodone-acetaminophen (NORCO) 5-325 MG per tablet Take 1 tablet by mouth every 6 (six) hours as needed.      Marland Kitchen levothyroxine (SYNTHROID, LEVOTHROID) 50 MCG tablet       . losartan-hydrochlorothiazide (HYZAAR) 50-12.5 MG per tablet       . meloxicam (MOBIC) 15 MG tablet       .  Multiple Vitamin (MULTIVITAMIN) tablet Take 1 tablet by mouth daily.      Marland Kitchen oxyCODONE (OXY IR/ROXICODONE) 5 MG immediate release tablet 1-2 q 4 hrs prn      . ranitidine (ZANTAC) 150 MG tablet Take 150 mg by mouth 2 (two) times daily.      . SYMBICORT 160-4.5 MCG/ACT inhaler       . vitamin B-12 (CYANOCOBALAMIN) 1000 MCG tablet Take 1,000 mcg by mouth every 30 (thirty) days.        Physical Examination  Filed Vitals:   12/03/11 1113  BP: 96/68  Pulse: 71  Resp: 12    Body mass index is 32.23 kg/(m^2).  General:  WDWN in NAD Gait: Normal HEENT: WNL Eyes: Pupils equal Pulmonary: normal non-labored breathing , without Rales, rhonchi,  wheezing Cardiac: RRR, without  Murmurs, rubs or gallops; Abdomen: soft, NT, no masses Skin: no rashes, ulcers noted  Vascular Exam Pulses: 2+ radial pulses bilaterally, palpable lower extremity pulses Carotid bruits: Carotid pulses to auscultation no bruits are heard Extremities without  ischemic changes, no Gangrene , no cellulitis; no open wounds;  Musculoskeletal: no muscle wasting or atrophy   Neurologic: A&O X 3; Appropriate Affect ; SENSATION: normal; MOTOR FUNCTION:  moving all extremities equally. Speech is fluent/normal  Non-Invasive Vascular Imaging CAROTID DUPLEX 12/03/2011  Right ICA 20 - 39 % stenosis Left ICA 20 - 39 % stenosis   ASSESSMENT/PLAN: This patient is 4 years status post AAA repair and doing well, stable carotid stenosis that is asymptomatic. The patient will return in one year for repeat carotid and AAA duplex. His questions were encouraged and answered.  Lauree Chandler ANP   Clinic MD: Imogene Burn

## 2011-12-06 ENCOUNTER — Other Ambulatory Visit: Payer: Private Health Insurance - Indemnity

## 2011-12-08 ENCOUNTER — Encounter (HOSPITAL_BASED_OUTPATIENT_CLINIC_OR_DEPARTMENT_OTHER): Payer: Self-pay | Admitting: *Deleted

## 2011-12-08 NOTE — Progress Notes (Signed)
Called for h/p Will need istat ekg from [facility]

## 2011-12-10 ENCOUNTER — Ambulatory Visit (HOSPITAL_BASED_OUTPATIENT_CLINIC_OR_DEPARTMENT_OTHER): Payer: Private Health Insurance - Indemnity | Admitting: Certified Registered"

## 2011-12-10 ENCOUNTER — Encounter (HOSPITAL_BASED_OUTPATIENT_CLINIC_OR_DEPARTMENT_OTHER): Payer: Self-pay | Admitting: Certified Registered"

## 2011-12-10 ENCOUNTER — Encounter (HOSPITAL_BASED_OUTPATIENT_CLINIC_OR_DEPARTMENT_OTHER): Payer: Self-pay | Admitting: Podiatry

## 2011-12-10 ENCOUNTER — Ambulatory Visit (HOSPITAL_BASED_OUTPATIENT_CLINIC_OR_DEPARTMENT_OTHER)
Admission: RE | Admit: 2011-12-10 | Discharge: 2011-12-10 | Disposition: A | Discharge status: Home / Self Care | Payer: Private Health Insurance - Indemnity | Source: Ambulatory Visit | Attending: Podiatry | Admitting: Podiatry

## 2011-12-10 ENCOUNTER — Encounter (HOSPITAL_BASED_OUTPATIENT_CLINIC_OR_DEPARTMENT_OTHER): Payer: Self-pay | Admitting: Anesthesiology

## 2011-12-10 ENCOUNTER — Encounter (HOSPITAL_BASED_OUTPATIENT_CLINIC_OR_DEPARTMENT_OTHER)
Admission: RE | Disposition: A | Discharge status: Home / Self Care | Payer: Self-pay | Source: Ambulatory Visit | Attending: Podiatry

## 2011-12-10 DIAGNOSIS — M2020 Hallux rigidus, unspecified foot: Secondary | ICD-10-CM | POA: Insufficient documentation

## 2011-12-10 DIAGNOSIS — M2040 Other hammer toe(s) (acquired), unspecified foot: Secondary | ICD-10-CM | POA: Insufficient documentation

## 2011-12-10 DIAGNOSIS — Q799 Congenital malformation of musculoskeletal system, unspecified: Secondary | ICD-10-CM | POA: Insufficient documentation

## 2011-12-10 HISTORY — DX: Other complications of anesthesia, initial encounter: T88.59XA

## 2011-12-10 HISTORY — PX: HAMMER TOE SURGERY: SHX385

## 2011-12-10 HISTORY — PX: METATARSAL OSTEOTOMY: SHX1641

## 2011-12-10 HISTORY — DX: Adverse effect of unspecified anesthetic, initial encounter: T41.45XA

## 2011-12-10 LAB — POCT I-STAT, CHEM 8
BUN: 19 mg/dL (ref 6–23)
Calcium, Ion: 1.12 mmol/L (ref 1.12–1.32)
Creatinine, Ser: 1.2 mg/dL (ref 0.50–1.35)
TCO2: 38 mmol/L (ref 0–100)

## 2011-12-10 SURGERY — CORRECTION, HAMMER TOE
Anesthesia: General | Site: Toe | Laterality: Right | Wound class: Clean

## 2011-12-10 MED ORDER — CHLORHEXIDINE GLUCONATE 4 % EX LIQD: 60.0000 mL | Freq: Once | CUTANEOUS | Status: DC

## 2011-12-10 MED ORDER — LIDOCAINE HCL 2 % IJ SOLN
INTRAMUSCULAR | Status: DC | PRN
  Administered 2011-12-10: 10 mL

## 2011-12-10 MED ORDER — EPHEDRINE SULFATE 50 MG/ML IJ SOLN
INTRAMUSCULAR | Status: DC | PRN
  Administered 2011-12-10 (×3): 10 mg via INTRAVENOUS
  Administered 2011-12-10: 15 mg via INTRAVENOUS
  Administered 2011-12-10 (×2): 10 mg via INTRAVENOUS

## 2011-12-10 MED ORDER — DEXAMETHASONE SODIUM PHOSPHATE 4 MG/ML IJ SOLN
INTRAMUSCULAR | Status: DC | PRN
  Administered 2011-12-10: 8 mg via INTRAVENOUS

## 2011-12-10 MED ORDER — HYDROMORPHONE HCL PF 1 MG/ML IJ SOLN: 0.2500 mg | INTRAMUSCULAR | Status: DC | PRN

## 2011-12-10 MED ORDER — OXYCODONE-ACETAMINOPHEN 10-650 MG PO TABS: 1.0000 | ORAL_TABLET | Freq: Four times a day (QID) | ORAL | Status: AC | PRN

## 2011-12-10 MED ORDER — ONDANSETRON HCL 4 MG/2ML IJ SOLN
INTRAMUSCULAR | Status: DC | PRN
  Administered 2011-12-10 (×2): 4 mg via INTRAVENOUS

## 2011-12-10 MED ORDER — MIDAZOLAM HCL 5 MG/5ML IJ SOLN
INTRAMUSCULAR | Status: DC | PRN
  Administered 2011-12-10: 1 mg via INTRAVENOUS

## 2011-12-10 MED ORDER — LIDOCAINE HCL (CARDIAC) 20 MG/ML IV SOLN
INTRAVENOUS | Status: DC | PRN
  Administered 2011-12-10: 40 mg via INTRAVENOUS

## 2011-12-10 MED ORDER — SODIUM CHLORIDE 0.9 % IR SOLN
Status: DC | PRN
  Administered 2011-12-10: 14:00:00

## 2011-12-10 MED ORDER — FENTANYL CITRATE 0.05 MG/ML IJ SOLN
INTRAMUSCULAR | Status: DC | PRN
  Administered 2011-12-10: 50 ug via INTRAVENOUS

## 2011-12-10 MED ORDER — BUPIVACAINE HCL (PF) 0.5 % IJ SOLN
INTRAMUSCULAR | Status: DC | PRN
  Administered 2011-12-10: 10 mL

## 2011-12-10 MED ORDER — PROPOFOL 10 MG/ML IV EMUL
INTRAVENOUS | Status: DC | PRN
  Administered 2011-12-10: 150 mg via INTRAVENOUS

## 2011-12-10 MED ORDER — LACTATED RINGERS IV SOLN
INTRAVENOUS | Status: DC
  Administered 2011-12-10 (×2): via INTRAVENOUS

## 2011-12-10 MED ORDER — CLINDAMYCIN PHOSPHATE 600 MG/50ML IV SOLN
600.0000 mg | INTRAVENOUS | Status: AC
  Administered 2011-12-10 (×2): 600 mg via INTRAVENOUS

## 2011-12-10 MED ORDER — PROMETHAZINE HCL 12.5 MG PO TABS: 25.0000 mg | ORAL_TABLET | Freq: Four times a day (QID) | ORAL | Status: DC | PRN

## 2011-12-10 SURGICAL SUPPLY — 65 items
BANDAGE CONFORM 2  STR LF (GAUZE/BANDAGES/DRESSINGS) ×3 IMPLANT
BANDAGE ELASTIC 3 VELCRO ST LF (GAUZE/BANDAGES/DRESSINGS) ×3 IMPLANT
BENZOIN TINCTURE PRP APPL 2/3 (GAUZE/BANDAGES/DRESSINGS) IMPLANT
BLADE CCA MICRO SAG (BLADE) ×3 IMPLANT
BLADE MINI RND TIP GREEN BEAV (BLADE) ×3 IMPLANT
BLADE SURG 15 STRL LF DISP TIS (BLADE) ×6 IMPLANT
BLADE SURG 15 STRL SS (BLADE) ×3
BNDG COHESIVE 1X5 TAN STRL LF (GAUZE/BANDAGES/DRESSINGS) IMPLANT
BNDG COHESIVE 3X5 TAN STRL LF (GAUZE/BANDAGES/DRESSINGS) ×3 IMPLANT
BNDG ESMARK 4X9 LF (GAUZE/BANDAGES/DRESSINGS) ×3 IMPLANT
COVER MAYO STAND STRL (DRAPES) ×3 IMPLANT
COVER TABLE BACK 60X90 (DRAPES) ×3 IMPLANT
DECANTER SPIKE VIAL GLASS SM (MISCELLANEOUS) IMPLANT
DRAPE EXTREMITY T 121X128X90 (DRAPE) ×3 IMPLANT
DRAPE OEC MINIVIEW 54X84 (DRAPES) ×3 IMPLANT
DRAPE U 20/CS (DRAPES) ×3 IMPLANT
DRILL BIT WIRE PASS (BIT) IMPLANT
DRILL WIRE PASS MED (BIT) ×3 IMPLANT
DURAPREP 26ML APPLICATOR (WOUND CARE) ×3 IMPLANT
ELECT REM PT RETURN 9FT ADLT (ELECTROSURGICAL) ×3
ELECTRODE REM PT RTRN 9FT ADLT (ELECTROSURGICAL) ×2 IMPLANT
GAUZE SPONGE 4X4 16PLY XRAY LF (GAUZE/BANDAGES/DRESSINGS) IMPLANT
GAUZE XEROFORM 1X8 LF (GAUZE/BANDAGES/DRESSINGS) ×3 IMPLANT
GLOVE BIO SURGEON STRL SZ 6.5 (GLOVE) ×3 IMPLANT
GLOVE BIO SURGEON STRL SZ7 (GLOVE) ×3 IMPLANT
GLOVE BIO SURGEON STRL SZ7.5 (GLOVE) ×6 IMPLANT
GLOVE BIOGEL PI IND STRL 7.0 (GLOVE) ×2 IMPLANT
GLOVE BIOGEL PI IND STRL 7.5 (GLOVE) ×2 IMPLANT
GLOVE BIOGEL PI INDICATOR 7.0 (GLOVE) ×1
GLOVE BIOGEL PI INDICATOR 7.5 (GLOVE) ×1
GLOVE ECLIPSE 6.5 STRL STRAW (GLOVE) ×3 IMPLANT
GLOVE EXAM NITRILE EXT CUFF MD (GLOVE) ×3 IMPLANT
GOWN PREVENTION PLUS XLARGE (GOWN DISPOSABLE) ×12 IMPLANT
GOWN PREVENTION PLUS XXLARGE (GOWN DISPOSABLE) IMPLANT
IMPL TOE SZ 5 (Toe) ×2 IMPLANT
IMPLANT TOE SZ 5 (Toe) ×3 IMPLANT
NDL SAFETY ECLIPSE 18X1.5 (NEEDLE) ×2 IMPLANT
NEEDLE HYPO 18GX1.5 SHARP (NEEDLE) ×1
NEEDLE HYPO 25X1 1.5 SAFETY (NEEDLE) ×6 IMPLANT
ORTHOPRO 1.1 KWIRE ×3 IMPLANT
PACK BASIN DAY SURGERY FS (CUSTOM PROCEDURE TRAY) ×3 IMPLANT
PADDING CAST ABS 4INX4YD NS (CAST SUPPLIES) ×1
PADDING CAST ABS COTTON 4X4 ST (CAST SUPPLIES) ×2 IMPLANT
PENCIL BUTTON HOLSTER BLD 10FT (ELECTRODE) ×3 IMPLANT
SCREW CANN 2.5X42 (Screw) ×3 IMPLANT
SCREW CORTEX ST 2.0X12 (Screw) ×3 IMPLANT
SCREW CORTEX ST 2.0X14 (Screw) ×3 IMPLANT
SPONGE GAUZE 2X2 8PLY STRL LF (GAUZE/BANDAGES/DRESSINGS) ×3 IMPLANT
SPONGE GAUZE 4X4 12PLY (GAUZE/BANDAGES/DRESSINGS) ×3 IMPLANT
STOCKINETTE 4X48 STRL (DRAPES) ×3 IMPLANT
STOCKINETTE TUBULAR COTT 4X25 (GAUZE/BANDAGES/DRESSINGS) ×3 IMPLANT
SUT ETHILON 3 0 PS 1 (SUTURE) IMPLANT
SUT MNCRL AB 4-0 PS2 18 (SUTURE) ×6 IMPLANT
SUT MON AB 3-0 SH 27 (SUTURE) ×1
SUT MON AB 3-0 SH27 (SUTURE) ×2 IMPLANT
SUT MON AB 5-0 P3 18 (SUTURE) ×3 IMPLANT
SUT PROLENE 4 0 PS 2 18 (SUTURE) ×3 IMPLANT
SUT PROLENE 5 0 P 3 (SUTURE) IMPLANT
SUT STEEL 2 0 (SUTURE) IMPLANT
SUT STRIPS 1/4 X 4 INCH (SUTURE) IMPLANT
SYR 3ML 18GX1 1/2 (SYRINGE) IMPLANT
SYR BULB 3OZ (MISCELLANEOUS) ×3 IMPLANT
SYR CONTROL 10ML LL (SYRINGE) ×6 IMPLANT
UNDERPAD 30X30 INCONTINENT (UNDERPADS AND DIAPERS) ×3 IMPLANT
WATER STERILE IRR 1000ML POUR (IV SOLUTION) IMPLANT

## 2011-12-10 NOTE — Anesthesia Postprocedure Evaluation (Signed)
  Anesthesia Post-op Note  Patient: Chad Huffman  Procedure(s) Performed: Procedure(s) (LRB): HAMMER TOE CORRECTION (Right) METATARSAL OSTEOTOMY (Right)  Patient Location: PACU  Anesthesia Type: General  Level of Consciousness: awake  Airway and Oxygen Therapy: Patient Spontanous Breathing and Patient connected to face mask oxygen  Post-op Pain: none  Post-op Assessment: Post-op Vital signs reviewed, Patient's Cardiovascular Status Stable, Respiratory Function Stable, Patent Airway and No signs of Nausea or vomiting  Post-op Vital Signs: Reviewed and stable  Complications: No apparent anesthesia complications

## 2011-12-10 NOTE — Transfer of Care (Signed)
Immediate Anesthesia Transfer of Care Note  Patient: Chad Huffman  Procedure(s) Performed: Procedure(s) (LRB): HAMMER TOE CORRECTION (Right) METATARSAL OSTEOTOMY (Right)  Patient Location: PACU  Anesthesia Type: General  Level of Consciousness: awake, alert  and oriented  Airway & Oxygen Therapy: Patient Spontanous Breathing and Patient connected to face mask oxygen  Post-op Assessment: Report given to PACU RN and Post -op Vital signs reviewed and stable  Post vital signs: Reviewed and stable  Complications: No apparent anesthesia complications

## 2011-12-10 NOTE — Anesthesia Procedure Notes (Signed)
Procedure Name: LMA Insertion Date/Time: 12/10/2011 12:34 PM Performed by: Verlan Friends Pre-anesthesia Checklist: Patient identified, Emergency Drugs available, Suction available, Patient being monitored and Timeout performed Patient Re-evaluated:Patient Re-evaluated prior to inductionOxygen Delivery Method: Circle System Utilized Preoxygenation: Pre-oxygenation with 100% oxygen Intubation Type: IV induction Ventilation: Mask ventilation without difficulty LMA: LMA with gastric port inserted LMA Size: 5.0 Number of attempts: 1 Placement Confirmation: positive ETCO2 Tube secured with: Tape Dental Injury: Teeth and Oropharynx as per pre-operative assessment  Comments: Patient has no ROM to neck, and mandible does not advance. Amazingly he is easily mask ventilated.

## 2011-12-10 NOTE — Discharge Instructions (Addendum)
1. If you are recuperating from surgery anywhere other than home, please be sure to leave [place] the number where you can be reached.  2. Go directly home and rest.  3. Keep the operated foot (or feet) elevated six inches above the hip when sitting or lying.  4. Support the elevated foot and leg with pillows. DO NOT PLACE PILLOWS UNDER THE KNEE.  5. DO NOT REMOVE or get your bandages WET, you run a higher risk of getting an infection.  6. Wear your surgical shoe at all times when you are up.  7. A limited amount of pain swelling may occur. The skin may take on a bruised appearance. This is no cause for alarm.  8.For slight pain and swelling, apply an ice pack directly over the bandage for 15 minutes only out of each hour. Continue until seen in the office. DO NOT apply any form of heat to the area.  9. Have prescription(s) filled immediately and take as directed.  10. Drink a lot of liquids, water and juice.  11. CALL THE OFFICE IMMEDIATELY IF A.Bleeding continues B.Pain increases and/or does not respond to medication C.Bandage or cast appears too tight D. Any liquids (water, coffee, etc.) spilling on your bandages E. Tripping, falling or stubbing the surgical foot F. If your temperature goes above 101 G. If you have ANY questions at all  12. Please use the crutches or walker that you rented. Do NO put weight on the operated foot for 0 days  13. Special instructions: Keep right foot elevated 50 minutes out of each hour.  Wear the boot at all times.  Ice pack; 20 minutes on and 20 off.  Place Ice pack behind knee.  When walking, put full weight on foot.  14. Your next appointment is: Thursday, December 16, 2011 @ 3:00pm  YOU NOW CONTROL THE EFFORT OF YOUR RECOVERY: Adhering to these rules will offer the most complete results.      Post Anesthesia Home Care Instructions  Activity: Get plenty of rest for the remainder of the day. A responsible adult should stay with you for 24 hours  following the procedure.  For the next 24 hours, DO NOT: -Drive a car -Advertising copywriter -Drink alcoholic beverages -Take any medication unless instructed by your physician -Make any legal decisions or sign important papers.  Meals: Start with liquid foods such as gelatin or soup. Progress to regular foods as tolerated. Avoid greasy, spicy, heavy foods. If nausea and/or vomiting occur, drink only clear liquids until the nausea and/or vomiting subsides. Call your physician if vomiting continues.  Special Instructions/Symptoms: Your throat may feel dry or sore from the anesthesia or the breathing tube placed in your throat during surgery. If this causes discomfort, gargle with warm salt water. The discomfort should disappear within 24 hours.

## 2011-12-10 NOTE — Brief Op Note (Signed)
12/10/2011    2:22 PM  PATIENT:  Chad Huffman  66 y.o. 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

## 2011-12-10 NOTE — Anesthesia Preprocedure Evaluation (Addendum)
Anesthesia Evaluation  Patient identified by MRN, date of birth, ID band Patient awake    Reviewed: Allergy & Precautions, H&P , NPO status , Patient's Chart, lab work & pertinent test results  Airway Mallampati: III TM Distance: >3 FB Neck ROM: Limited    Dental No notable dental hx. (+) Teeth Intact and Dental Advisory Given   Pulmonary COPD breath sounds clear to auscultation  Pulmonary exam normal       Cardiovascular hypertension, On Medications Rhythm:Regular Rate:Normal     Neuro/Psych negative neurological ROS  negative psych ROS   GI/Hepatic negative GI ROS, Neg liver ROS,   Endo/Other  negative endocrine ROS  Renal/GU negative Renal ROS  negative genitourinary   Musculoskeletal   Abdominal   Peds  Hematology negative hematology ROS (+)   Anesthesia Other Findings   Reproductive/Obstetrics negative OB ROS                           Anesthesia Physical Anesthesia Plan  ASA: III  Anesthesia Plan: General   Post-op Pain Management:    Induction: Intravenous  Airway Management Planned: LMA  Additional Equipment:   Intra-op Plan:   Post-operative Plan: Extubation in OR  Informed Consent: I have reviewed the patients History and Physical, chart, labs and discussed the procedure including the risks, benefits and alternatives for the proposed anesthesia with the patient or authorized representative who has indicated his/her understanding and acceptance.   Dental advisory given  Plan Discussed with: CRNA  Anesthesia Plan Comments: (Pt. Is stable and ok for GA.)       Anesthesia Quick Evaluation

## 2011-12-10 NOTE — H&P (Signed)
  The patient has been reexamined the H&P has been reviewed and there is no change in plan of care.

## 2011-12-14 NOTE — Procedures (Unsigned)
CAROTID DUPLEX EXAM  INDICATION:  Carotid disease  HISTORY: Diabetes:  No Cardiac:  No Hypertension:  Yes Smoking:  Previous Previous Surgery:  Abdominal aortic aneurysm repair CV History:  Currently asymptomatic Amaurosis Fugax No, Paresthesias No, Hemiparesis No                                      RIGHT             LEFT Brachial systolic pressure:         96                94 Brachial Doppler waveforms:         Normal            Normal Vertebral direction of flow:        Antegrade         Antegrade DUPLEX VELOCITIES (cm/sec) CCA peak systolic                   89                72 ECA peak systolic                   77                167 ICA peak systolic                   72                70 ICA end diastolic                   31                31 PLAQUE MORPHOLOGY:                  Heterogeneous     Heterogeneous PLAQUE AMOUNT:                      Mild              Mild PLAQUE LOCATION:                    ICA/ECA           ICA/ECA/CCA  IMPRESSION: 1. Doppler velocity suggests 1%-39% stenosis of the bilateral internal     carotid arteries. 2. No significant change in Doppler velocities when compared to the     previous exam on 12/09/2010.   ___________________________________________ V. Charlena Cross, MD  CH/MEDQ  D:  12/06/2011  T:  12/06/2011  Job:  [id number]

## 2011-12-14 NOTE — Procedures (Unsigned)
DUPLEX ULTRASOUND OF ABDOMINAL AORTA  INDICATION:  Abdominal aortic aneurysm stent  HISTORY: Diabetes:  No Cardiac:  No Hypertension:  Yes Smoking:  Previous Connective Tissue Disorder: Family History:  No Previous Surgery:  Abdominal aortic aneurysm stent repair on 10/11/2007  DUPLEX EXAM:         AP (cm)                   TRANSVERSE (cm) Proximal             Not visualized            Not visualized Mid                  2.7 cm                    2.9 cm Distal               4.4 cm                    4.2 cm Right Iliac          1.2 cm                    1.3 cm Left Iliac           1.3 cm                    1.3 cm  PREVIOUS:  Date:  05/05/2009  AP:  4.6  TRANSVERSE:  4.6  IMPRESSION: 1. Patent abdominal aortic aneurysm stent repair with no evidence of     endoleak, stenosis or stent migration. 2. No significant change in the maximum diameter of the abdominal     aortic aneurysm noted when compared to the previous exam. 3. Unable to adequately visualize the proximal abdominal aorta due to     bowel gas patterns.  ___________________________________________ V. Charlena Cross, MD  CH/MEDQ  D:  12/06/2011  T:  12/06/2011  Job:  [id number]

## 2011-12-22 ENCOUNTER — Other Ambulatory Visit: Payer: Private Health Insurance - Indemnity

## 2011-12-24 ENCOUNTER — Encounter (HOSPITAL_BASED_OUTPATIENT_CLINIC_OR_DEPARTMENT_OTHER): Payer: Self-pay | Admitting: Podiatry

## 2011-12-29 NOTE — Op Note (Signed)
[name], [name]                 ACCOUNT NO.:  [id number]  MEDICAL RECORD NO.:  [id number]  LOCATION:                                 FACILITY:  PHYSICIAN:  Chad Huffman, D.P.M.   DATE OF BIRTH:  1946-03-30  DATE OF PROCEDURE: DATE OF DISCHARGE:                              OPERATIVE REPORT   PREOPERATIVE DIAGNOSES: 1. 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 2. Plantarflexed elongated second metatarsal, right foot. 3. Hammertoe deformity, second digit, right foot.  POSTOPERATIVE DIAGNOSES: 1. 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 2. Plantarflexed elongated second metatarsal, right foot. 3. Hammertoe deformity, second digit, right foot.  PROCEDURE: 1. Keller arthroplasty, first metatarsophalangeal joint, right foot     with a single Silicone implant, size #5 from right medical. 2. Second metatarsal osteotomy; Weil osteotomy with a 2.0 x 14 mm     Synthes screw, second right 3. Hammertoe repair, second digit, right foot; arthrodesis PIPJ, right     foot, utilizing an OrthoPro 2.5 x 42 mm cannulated digital     compression screw, right foot.  SURGEON:  Chad Huffman, D.P.M.  ASSISTANT:  Chad Huffman, DPM.  DESCRIPTION FOR PROCEDURE:  Chad Huffman was taken to the operating room, vital signs stable, alert and oriented x3, placed supine on the operating room table and general anesthesia was administered.  After induction of general anesthesia, local anesthetic was then introduced about the forefoot right with 50:50 mixture of Marcaine plain and lidocaine plain. Pneumatic ankle tourniquet was placed about the right lower extremity after cleaning of Webril pad and contusion.  The right lower extremity was then prepped and draped in a normal sterile fashion.  1. An Esmarch bandage was utilized to exsanguinate the blood from the     right lower extremity.  Pneumatic ankle tourniquet was then     inflated to 250 mmHg.  A dorsal  linear incision was made medial to     the extensor hallucis longus tendon.  It was carried deep with a     combination of sharp and blunt dissection to the level of the first     metatarsophalangeal joint and the base of the proximal phalanx.     This incision was carried deep with a combination of sharp and     blunt dissection.  All neurovascular structures were carefully     retracted.  All bleeders were bovied and were ligated.  Once to the     level of the periosteum and the joint capsule, a linear incision     was made utilizing a 15-blade and the periosteal elevator, I was     able to reflect the periosteum and the capsule from the first     metatarsal and the base of the proximal phalanx.  Then doing so, I     was able to deliver the head of the first metatarsal through the     wound.  Noting at this point that he had complete loss of all     cartilaginous structure, had severe dorsal, medial, lateral, and     plantar spurring.  I resected all of these  spurs with the sagittal     saw.  I also resected the cartilaginous for subchondral surface     from dorsal to plantar transversely in an attitude they would     provide a rectus hallux.  Once I resected this, I then resected the     base of the proximal phalanx of the hallux right from dorsal to     plantar.  At this point, I then removed the base of that proximal     phalanx of the subchondral bone and some of the cartilaginous     surface that was with it, placed it on the back table.  I then     carefully measured the appropriate size of the head of the first     metatarsal and the base of the proximal phalanx and chose a right     medical single Silicone implant size #5.  Utilizing a bulbar, I     then began my approach to broaching the first metatarsal.  I     initiated the hole with a bulbar and then broached it with a size 5     proximal broach.  Similarly, I did the proximal phalanx with the     distal broach.  At this  point, I then placed a trial, he had about     85 degrees of dorsiflexion and about 20 degrees of plantar flexion.     So we chose a size 5 and that was placed in the joint sterilely     after copious amounts of normal sterile saline and antibiotic     solution had been flushed in.  The area was copiously lavaged once     again.  The size 5 right medical implant was placed and then     closure of the periosteum and the capsule was achieved with a 3-0     Monocryl, 4-0 Monocryl for subcutaneous closure, 5-0 Monocryl for     skin closure.  I had a very good range of motion and a perfect     outcome for this.  XiScan photos were taken as well.  2. Attention was then directed to the second metatarsal.  Overlying     the second metatarsophalangeal joint, an incision was made with a     15-blade.  It was carried deep with a combination of sharp and     blunt dissection.  All neurovascular structures were carefully     retracted.  All bleeders were bovied and were ligated.  Once to the     level of the extensor hood apparatus, it was then incised medially     reflected laterally.  This revealed the periosteum and the capsule     of the second metatarsophalangeal joint, which was incised     linearly.  I reflected the periosteum and the capsule from the head     of the first metatarsal, introduced a McGlamry elevator to free the     plantar plate.  At this point, I then osteotomized with a Weil-type     osteotomy from dorsal, distal to proximal plantar at the head level     of the second metatarsal right.  This allowed for a proximal     dislocation of the head.  I slightly tipped it laterally since the     toe was tipped medially.  This would make this correction.  At this     point, I then placed a 2.0 x 14  mm Synthes screw in a typical lag     fashion across the osteotomy site.  I then resected the remaining     shells of bone with rongeur and recontoured to a normal anatomical      resemblance.  Chad Huffman was utilized to check the appearance once     again.  It was noted to be in good position.   3.  Attention was then                                                      directed to the second digit.  An another incision was made over     the second digit overlying the PIPJ.  This incision was carried     deep with a combination of sharp and blunt dissection to the level     of the PIPJ where a tenotomy was performed and a capsulotomy was     performed delivering the head of the proximal phalanx through the     wound.  This obviously has infection before and then had a large     spurring around it.  I sharply resected the cartilaginous and the     subchondral surface, distally, medially, and laterally and then as     well as I did resect the base of the intermediate phalanx as well     to the subchondral bone.  At this point, in a typical fashion     placed a 2.5 x 42 mm OrthoPro screw from distal aspect of the toe     proximally through to the base of the proximal phalanx.  This noted     to provide very nice compression of the PIPJ in the toe within     rectus position.  At this point, the area was copiously lavaged     with normal sterile saline.  Closure was made to the previous     procedure closing the capsule of the second metatarsophalangeal     joint with a 3-0 simple Monocryl or 3-0 suture in simple fashion     utilizing Monocryl and then subcutaneous closure was with a 4-0     Monocryl and subcuticular closure with a 5-0 Monocryl.  Attention     was then directed to the incision site of the toe, which was closed     simply with 5-0 Monocryl, resecting any tendon that was     overabundant.  At this point, I then used a wound stitch of 4-0     Prolene to close the screw hole at the end of the toe.  Betadine     solution was placed on all incision sites.  Xeroform gauze was     placed on all incision site. A dry sterile compressive dressing was     applied.  The  pneumatic ankle tourniquet was deflated.  Capillary     refill time of digits toes 1 through 5 of the right foot was noted     to be immediate.  The patient left the operating room.  Vital signs     stable, for PACU, where vital signs would be monitored for a period     of time before being released to family members.  He will also     receive his CAM walker there.  The  patient was given both oral and     written home going instructions for the care of his     right lower extremity and I will follow up with him in 1-week in     the [place] office.  Should there be questions or concerns, will     notify us immediately or notify the doctor on-call with the     emergency beeper system.          ______________________________ Annye Rusk. Al Corpus, D.P.M.     MTH/MEDQ  D:  12/10/2011  T:  12/11/2011  Job:  [id number]

## 2012-06-14 IMAGING — CT CT L SPINE W/O CM
4 of 10 series · 12 of 33 positions shown, 14 images · non-contrast
Comparison: 02/12/2009

CLINICAL DATA: Low back pain.  Bilateral leg pain.

CT LUMBAR SPINE WITHOUT CONTRAST
TECHNIQUE: Multidetector CT imaging of the lumbar spine was
performed without intravenous contrast administration. Multiplanar
CT image reconstructions were also generated.

[Series 3: l spine bone · axial · 0.30mm/px · z∈[-16,+60]mm · 2 of 90 slices shown, 3 images]
[im 30/90  soft-tissue]
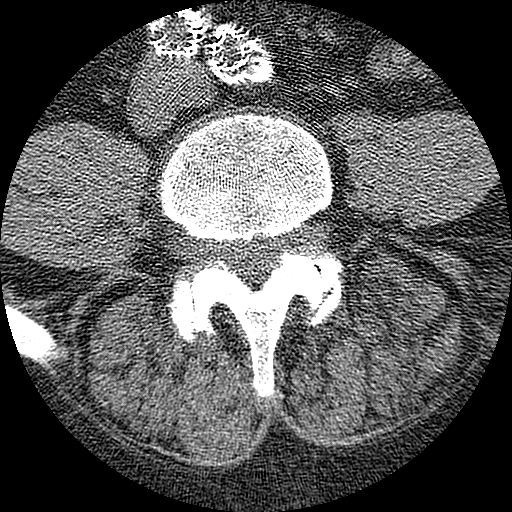
[im 30/90  bone]
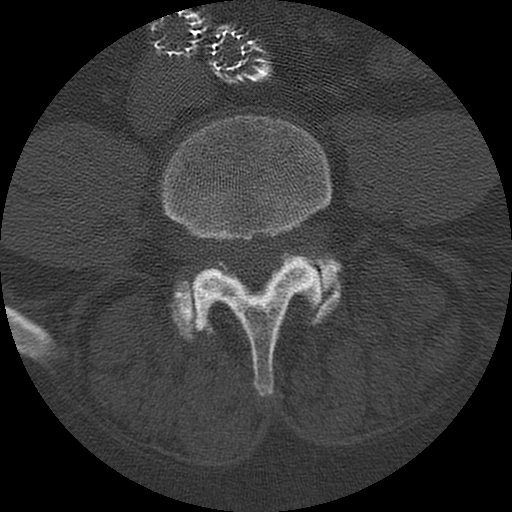
[im 60/90  bone]
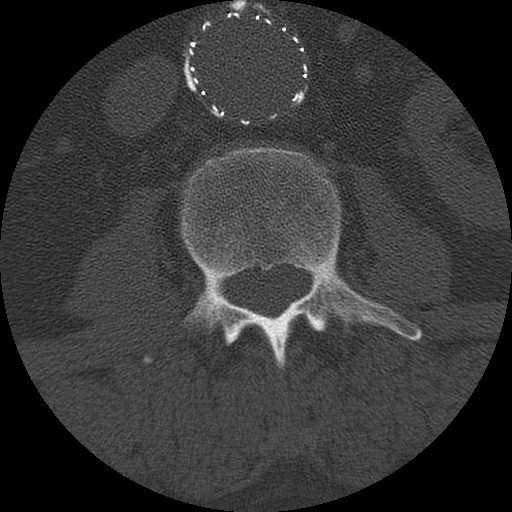

[Series 4: l spine soft · axial · 0.30mm/px · z∈[-16,+60]mm · 2 of 90 slices shown]
[im 30/90  soft-tissue]
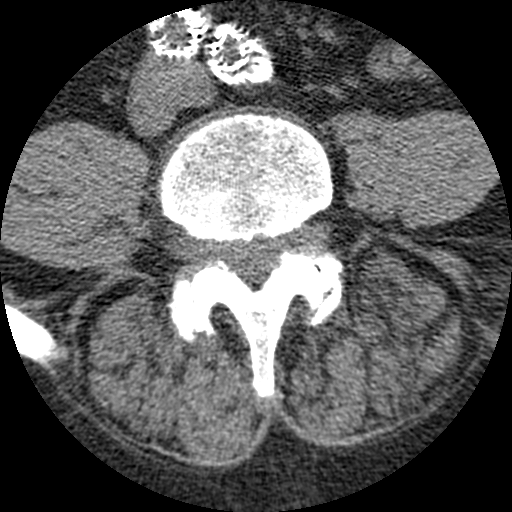
[im 60/90  soft-tissue]
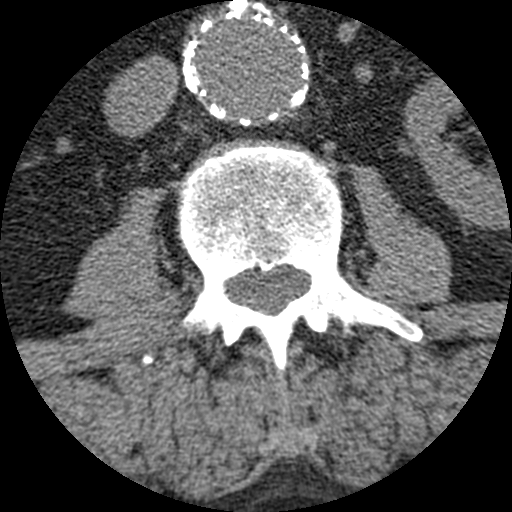

[Series 500: coronal · coronal · 0.44mm/px · 3 of 57 slices shown]
[im 12/57  bone]
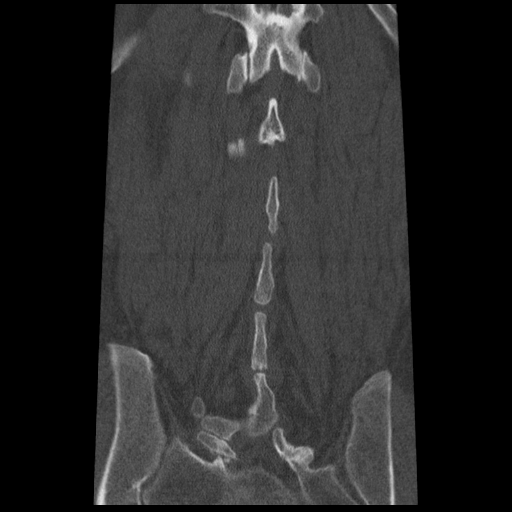
[im 23/57  bone]
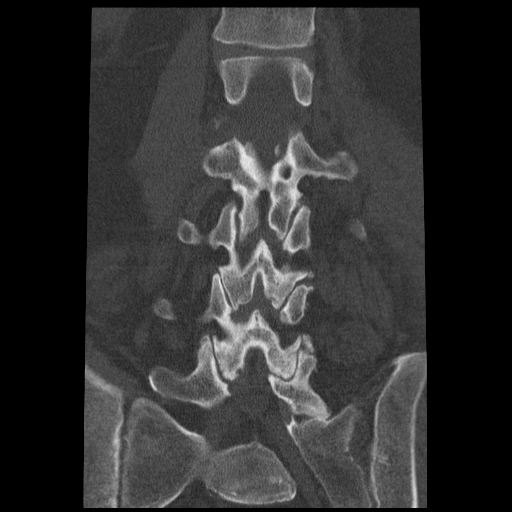
[im 34/57  bone]
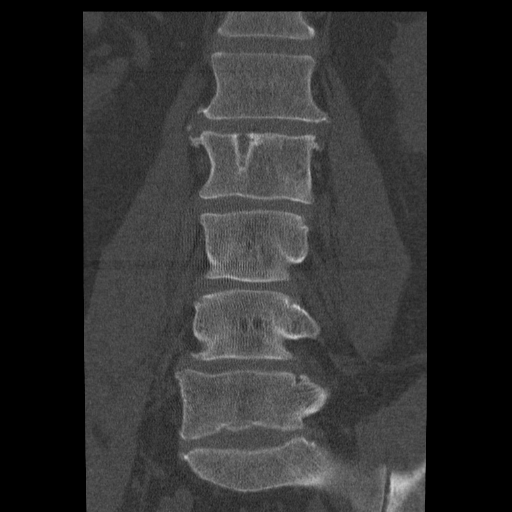

[Series 501: sagittal · sagittal · 0.44mm/px · 5 of 57 slices shown, 6 images]
[im 19/57  bone]
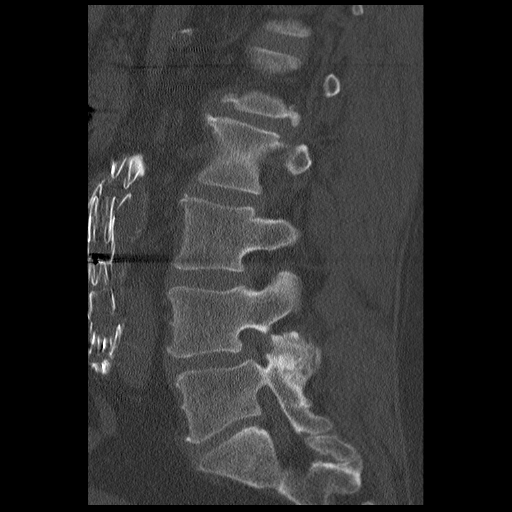
[im 24/57  bone]
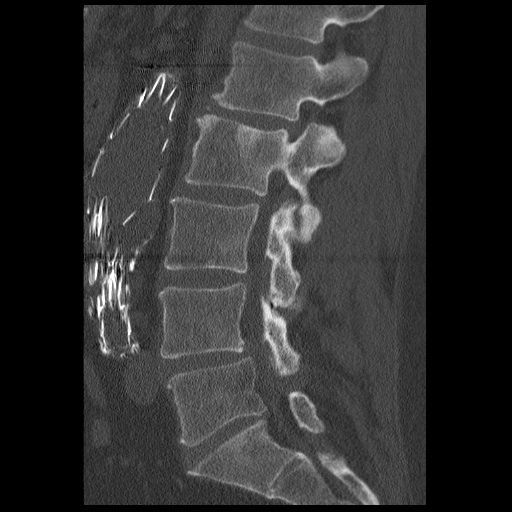
[im 29/57  soft-tissue]
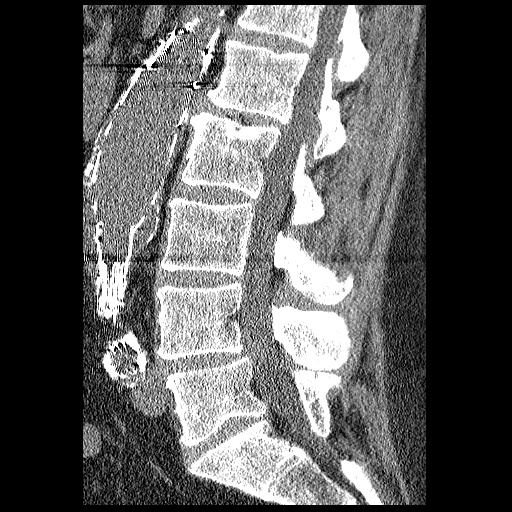
[im 29/57  bone]
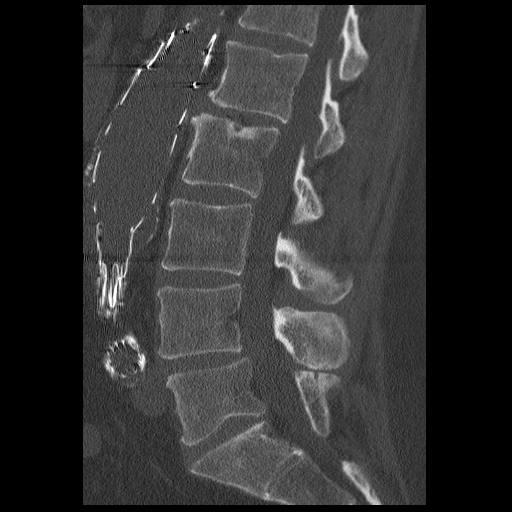
[im 33/57  bone]
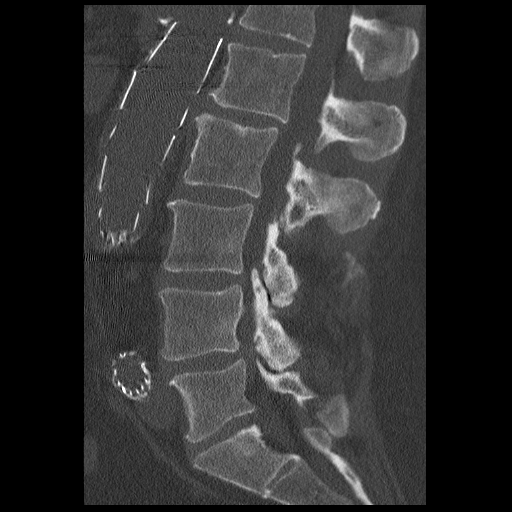
[im 38/57  bone]
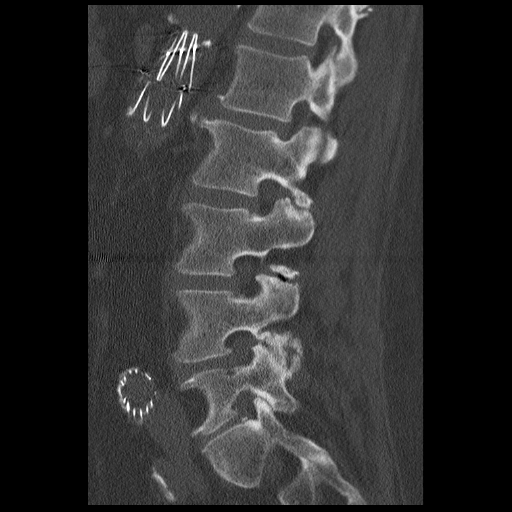

[12 of 33 positions shown; findings below may reference images not displayed]

FINDINGS: T12-L1:  No disc pathology.  Mild ligamentous prominence.
No compressive stenosis.

L1-2:  Disc degeneration with loss of height and vacuum phenomenon,
slightly progressive since the previous study.  Superior end plate
Schmorl's node at L2, slightly larger than on the previous study.
Mild facet degeneration and ligamentous hypertrophy.  Mild
narrowing of the lateral recesses without definite neural
compression.

L2-3:  Mild bulging of the disc.  Mild facet and ligamentous
prominence.  No compressive stenosis.

L3-4:  Mild circumferential bulging of the disc.  Facet
degeneration and ligamentous hypertrophy more on the left than the
right.  Stenosis of the lateral recesses, left more than right.

L4-5:  Bilateral facet arthropathy and hypertrophy.
Anterolisthesis of 2 mm.  Circumferential protrusion of disc
material.  Multifactorial spinal stenosis that could cause neural
compression on either or both sides.

L5-S1:  Mild bulging of the disc.  Anomalous or fused facet on the
right.  Canal and foramina are widely patent.

Aortic stent graft appears grossly the same.

Since the previous study, I think there is been slight worsening at
the L1-2 and the L4-5 levels.
IMPRESSION: Severe multifactorial spinal stenosis at L4-5.  Advanced bilateral
facet arthropathy.  Anterolisthesis of 2 mm.  Circumferential
protrusion of disc material.  This appearance could worsen with
standing or flexion.

L1-2:  Worsening degenerative disc disease.  Mild narrowing of the
lateral recesses without definite neural compression.

L3-4:  Lateral recess narrowing left more than right because of
bulging of the disc and asymmetric facet and ligamentous
hypertrophy.

## 2012-07-24 IMAGING — CT CT CERVICAL SPINE W/O CM
4 of 7 series · 12 of 33 positions shown, 13 images · non-contrast
Comparison: Cervical spine MRI 02/24/2001

CLINICAL DATA: Bilateral hand numbness and tingling.

CT CERVICAL SPINE WITHOUT CONTRAST
TECHNIQUE: Multidetector CT imaging of the cervical spine was
performed. Multiplanar CT image reconstructions were also
generated.

[Series 2: c spine bone · axial · 0.23mm/px · z∈[-146,-94]mm · 2 of 65 slices shown, 3 images]
[im 22/65  soft-tissue]
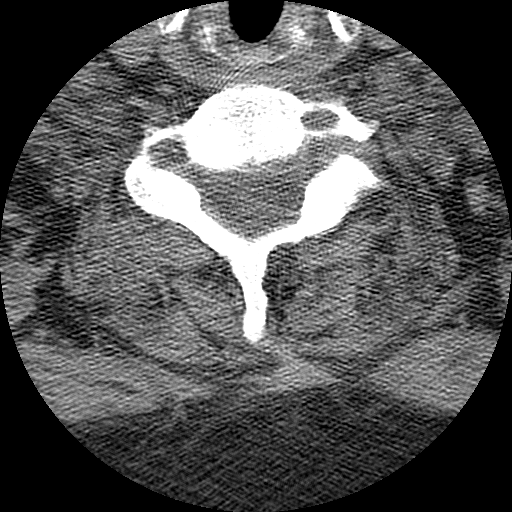
[im 22/65  bone]
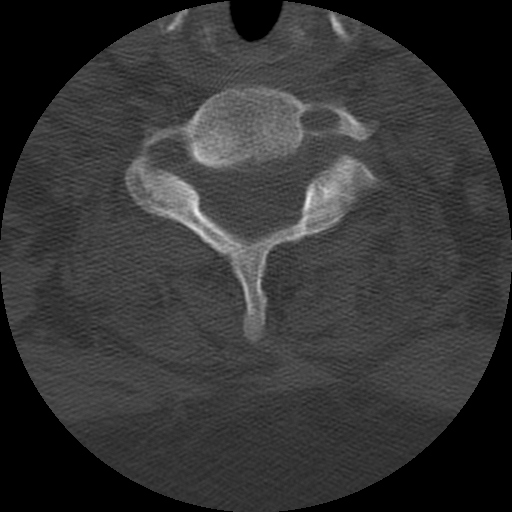
[im 43/65  bone]
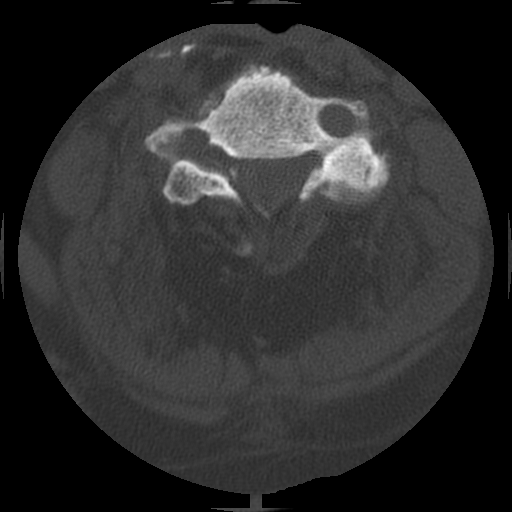

[Series 3: c spine soft · axial · 0.23mm/px · z∈[-146,-94]mm · 2 of 65 slices shown]
[im 22/65  soft-tissue]
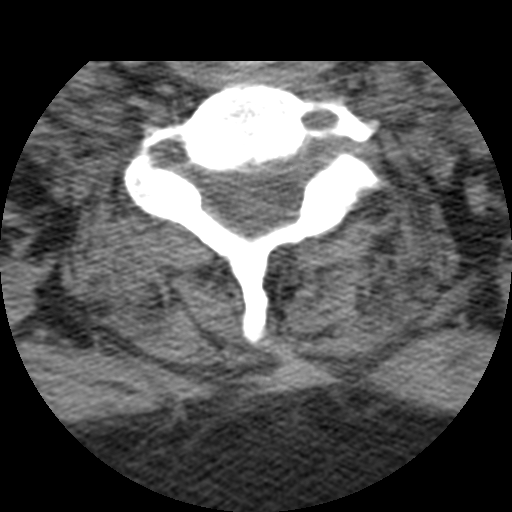
[im 43/65  soft-tissue]
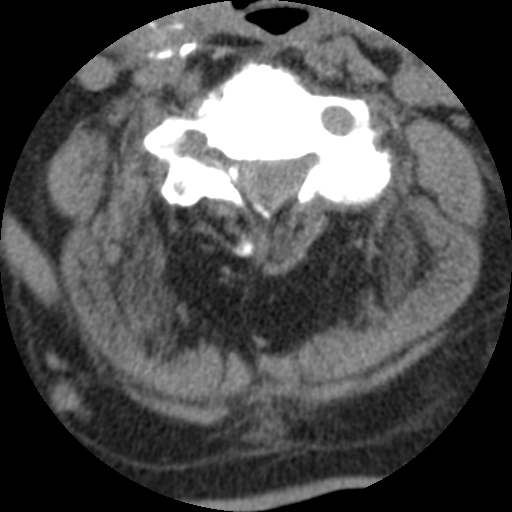

[Series 401: cor upper · coronal · 0.32mm/px · 5 of 34 slices shown]
[im 6/34  bone]
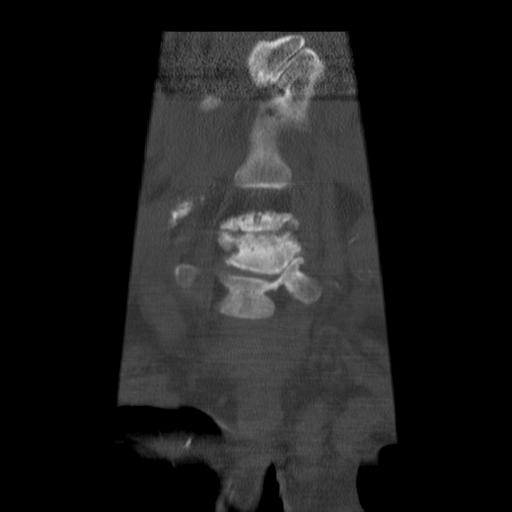
[im 12/34  bone]
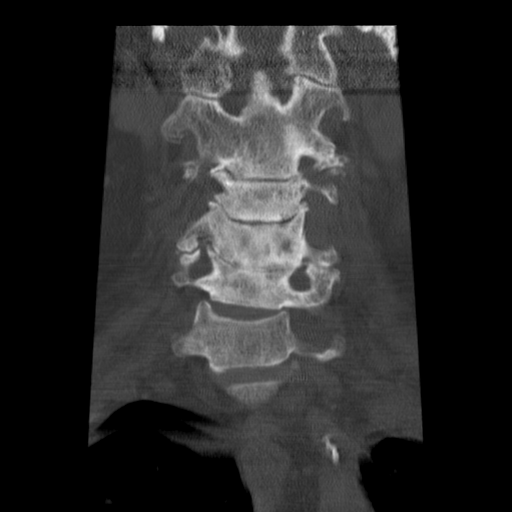
[im 17/34  bone]
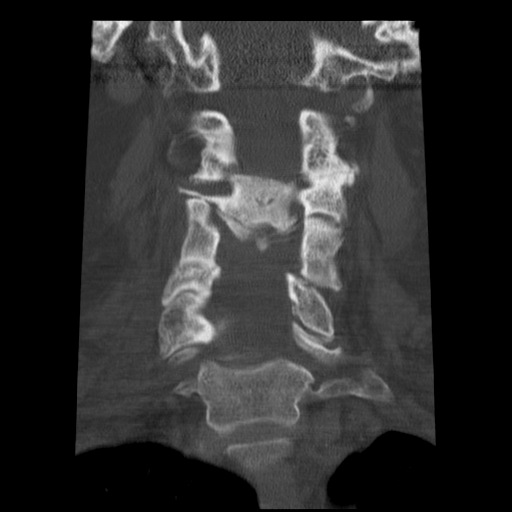
[im 23/34  bone]
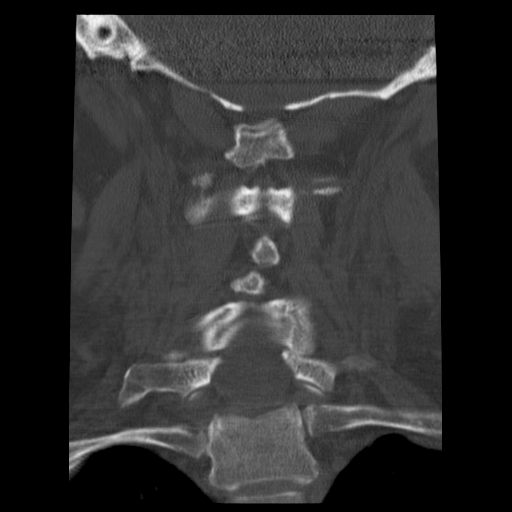
[im 28/34  bone]
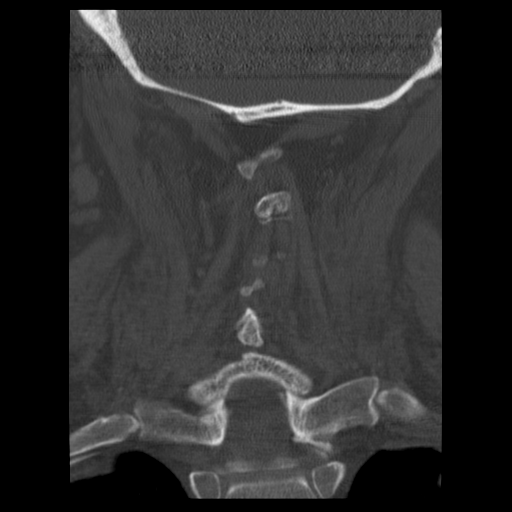

[Series 402: cor lower · coronal · 0.32mm/px · 3 of 34 slices shown]
[im 7/34  bone]
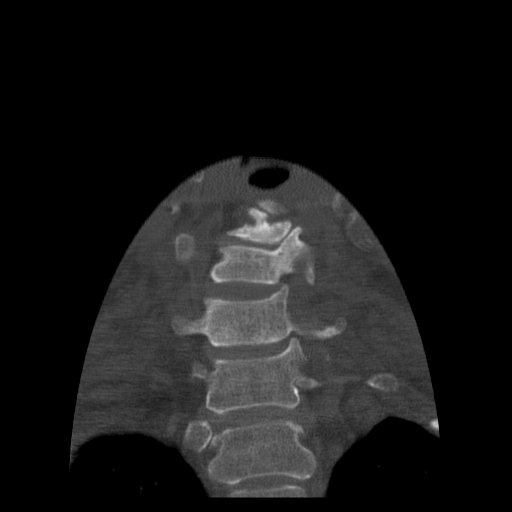
[im 14/34  bone]
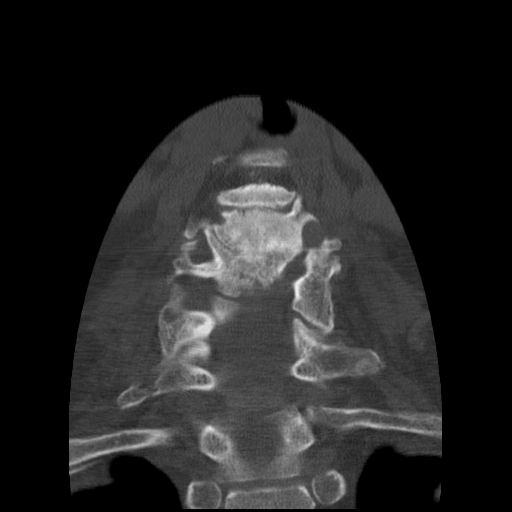
[im 20/34  bone]
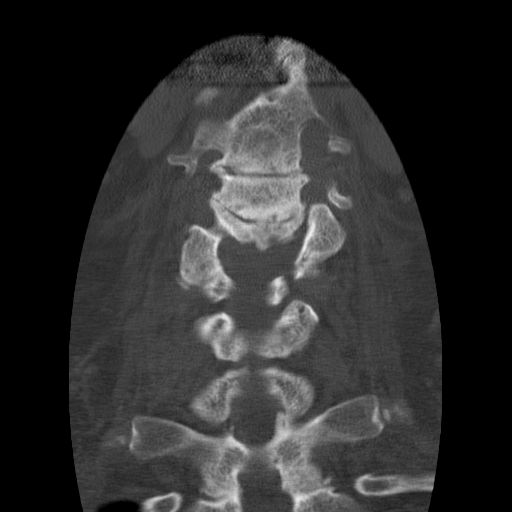

[12 of 33 positions shown; findings below may reference images not displayed]

FINDINGS: There is marked reversal of the normal cervical lordosis
from C2 to C5 with 0.6 cm of anterolisthesis of C2 on C3 and 0.3 cm
of retrolisthesis of C3 on C4.  The appearance is not markedly
changed.  There is partial autologous fusion of both the C3-4 and
C4-5 levels.  There is a greater degree of fusion across the C4-5
level.  No fracture is identified.

C2-3:  There is severe central canal stenosis due to
anterolisthesis and disc bulging.  The appearance is not markedly
changed.  Central canal measures approximately 0.6 cm AP.

C3-4:  Severe central canal stenosis is identified and appears
unchanged.

C4-5:  There appears to be a disc bulge and some endplate spurring
causing severe central canal narrowing, unchanged.

C5-6:  There is some disc bulging endplate spur.  Old fracture
versus incomplete fusion of the posterior, superior C5 facet is
identified.

C6-7:  Mild disc bulge without central canal narrowing.

C7-T1:  Disc bulge without central canal or foraminal narrowing.
IMPRESSION: 1.  Marked reversal of the normal cervical lordosis from C2-C5 with
severe central canal stenosis at each level as described above.
Autologous fusion of C3-4 and C4-5, greater at C4-5, again noted.
2.  Remote fracture versus incomplete fusion of the posterior,
superior facet of C5.

## 2012-08-15 ENCOUNTER — Other Ambulatory Visit: Payer: Self-pay | Admitting: Orthopedic Surgery

## 2012-08-15 DIAGNOSIS — R52 Pain, unspecified: Secondary | ICD-10-CM

## 2012-08-17 ENCOUNTER — Other Ambulatory Visit: Payer: Private Health Insurance - Indemnity

## 2012-08-22 ENCOUNTER — Ambulatory Visit
Admission: RE | Admit: 2012-08-22 | Discharge: 2012-08-22 | Disposition: A | Discharge status: Home / Self Care | Payer: Worker's Compensation | Source: Ambulatory Visit | Attending: Orthopedic Surgery | Admitting: Orthopedic Surgery

## 2012-08-22 ENCOUNTER — Other Ambulatory Visit: Payer: Self-pay | Admitting: Orthopedic Surgery

## 2012-08-22 DIAGNOSIS — R52 Pain, unspecified: Secondary | ICD-10-CM

## 2012-08-22 MED ORDER — IOHEXOL 180 MG/ML  SOLN
20.0000 mL | Freq: Once | INTRAMUSCULAR | Status: AC | PRN
  Administered 2012-08-22: 15 mL via INTRAVENOUS

## 2012-11-27 ENCOUNTER — Other Ambulatory Visit: Payer: Self-pay | Admitting: *Deleted

## 2012-11-27 DIAGNOSIS — I714 Abdominal aortic aneurysm, without rupture: Secondary | ICD-10-CM

## 2012-11-27 DIAGNOSIS — Z48812 Encounter for surgical aftercare following surgery on the circulatory system: Secondary | ICD-10-CM

## 2012-12-04 ENCOUNTER — Other Ambulatory Visit (INDEPENDENT_AMBULATORY_CARE_PROVIDER_SITE_OTHER): Payer: Medicare Other | Admitting: *Deleted

## 2012-12-04 ENCOUNTER — Other Ambulatory Visit: Payer: Private Health Insurance - Indemnity

## 2012-12-04 ENCOUNTER — Encounter (INDEPENDENT_AMBULATORY_CARE_PROVIDER_SITE_OTHER): Payer: Medicare Other | Admitting: *Deleted

## 2012-12-04 ENCOUNTER — Ambulatory Visit: Payer: Private Health Insurance - Indemnity | Admitting: Neurosurgery

## 2012-12-04 DIAGNOSIS — I6529 Occlusion and stenosis of unspecified carotid artery: Secondary | ICD-10-CM

## 2012-12-04 DIAGNOSIS — I714 Abdominal aortic aneurysm, without rupture: Secondary | ICD-10-CM

## 2012-12-04 DIAGNOSIS — Z48812 Encounter for surgical aftercare following surgery on the circulatory system: Secondary | ICD-10-CM

## 2012-12-05 ENCOUNTER — Other Ambulatory Visit: Payer: Self-pay | Admitting: *Deleted

## 2012-12-05 DIAGNOSIS — Z48812 Encounter for surgical aftercare following surgery on the circulatory system: Secondary | ICD-10-CM

## 2012-12-05 DIAGNOSIS — I714 Abdominal aortic aneurysm, without rupture: Secondary | ICD-10-CM

## 2012-12-06 ENCOUNTER — Encounter: Payer: Self-pay | Admitting: Surgery

## 2012-12-18 IMAGING — US US EXTREM LOW VENOUS BILAT
1 series · 17 of 24 positions shown · non-contrast
Comparison: none

REASON FOR EXAM: CR  [id number]  Stat bil leg edema eval for DV
COMMENTS:

[Series 1: us extrem low venous bilat · 17 of 47 slices shown]
[im 1/47]
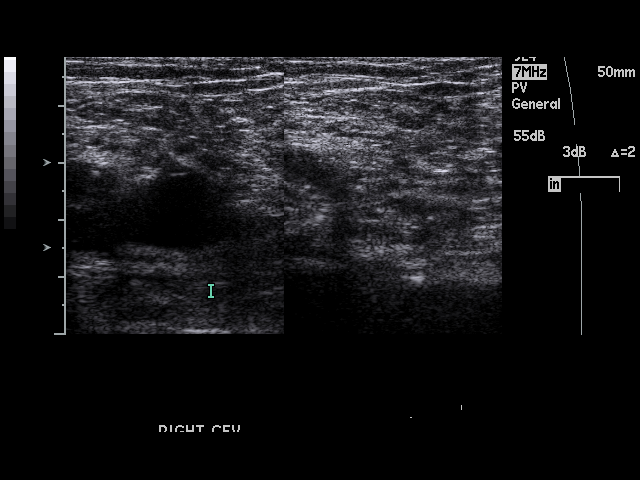
[im 5/47]
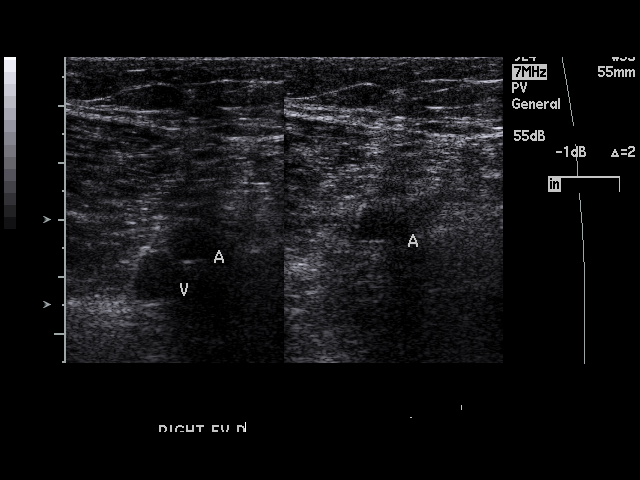
[im 7/47]
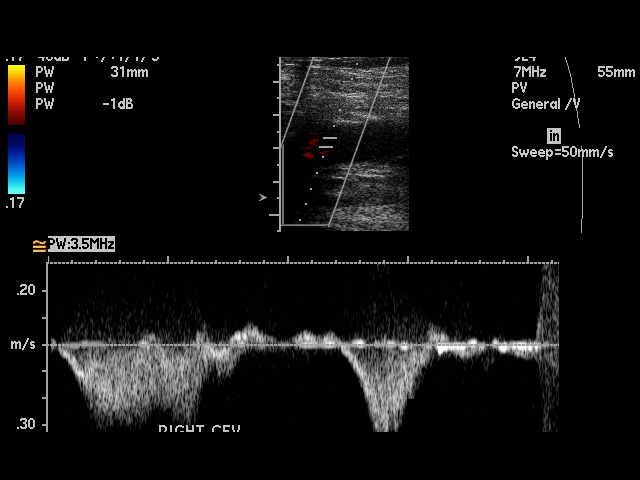
[im 9/47]
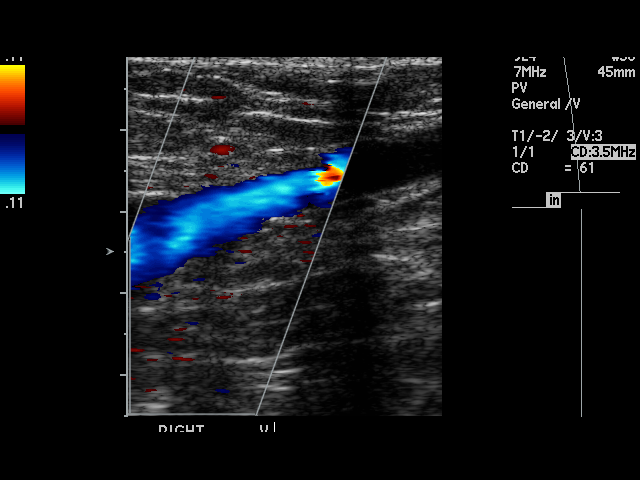
[im 13/47]
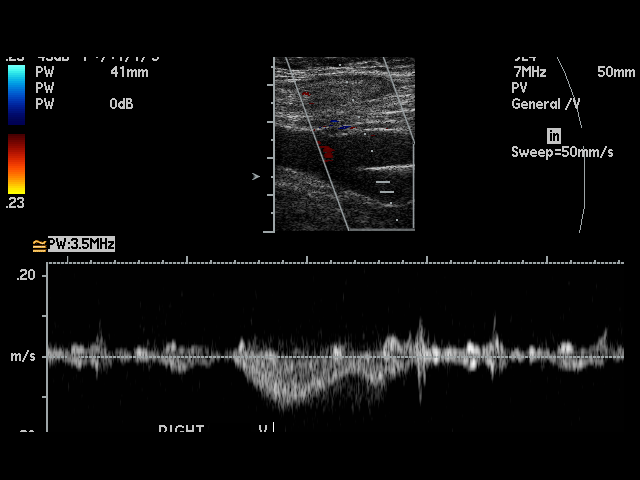
[im 15/47]
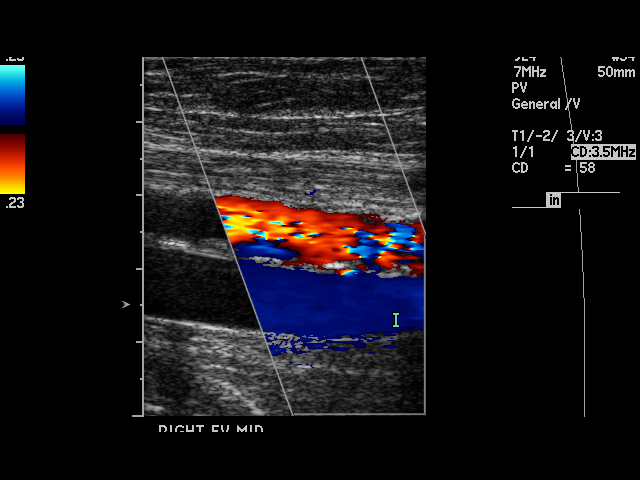
[im 19/47]
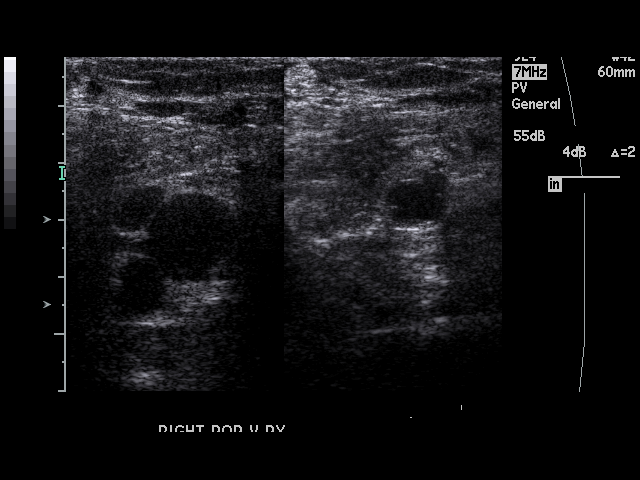
[im 21/47]
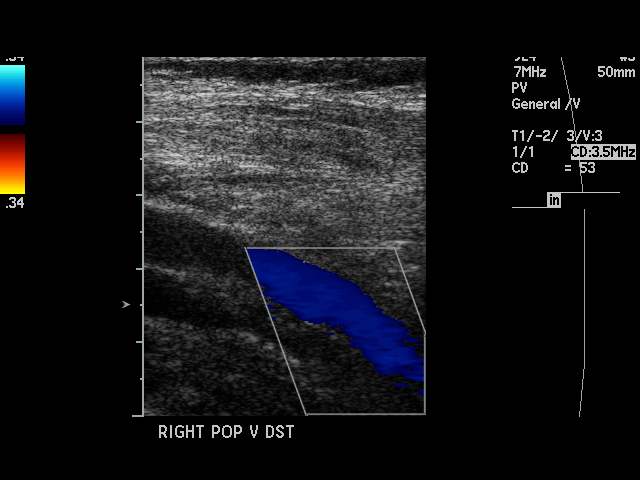
[im 25/47]
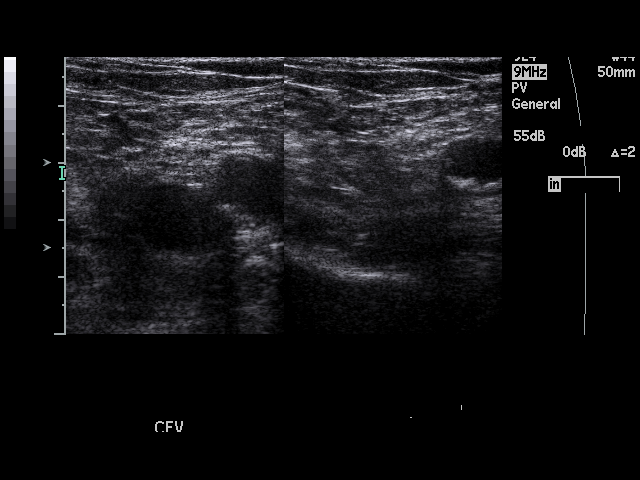
[im 27/47]
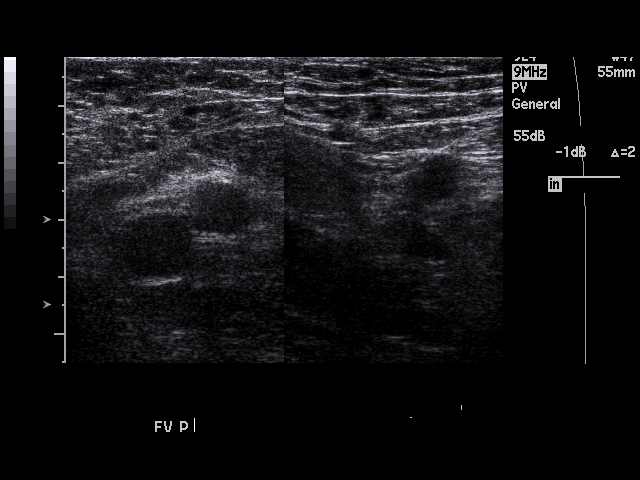
[im 29/47]
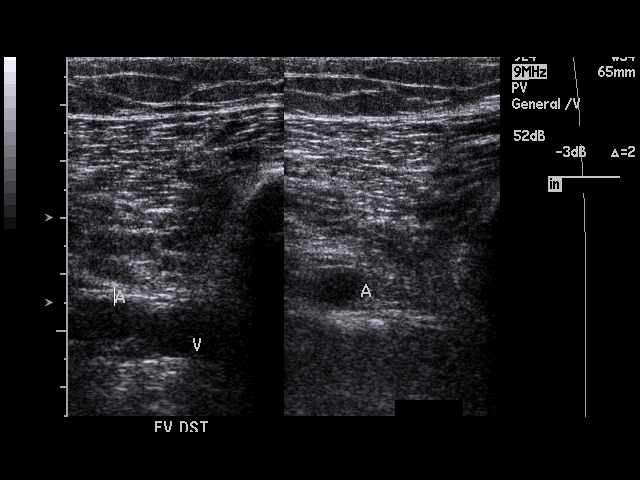
[im 33/47]
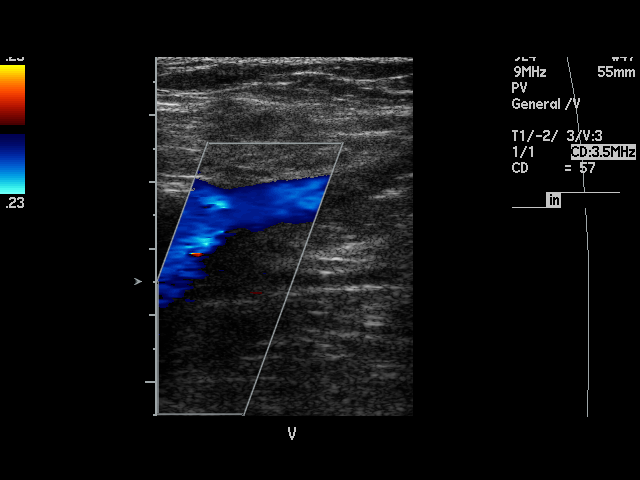
[im 35/47]
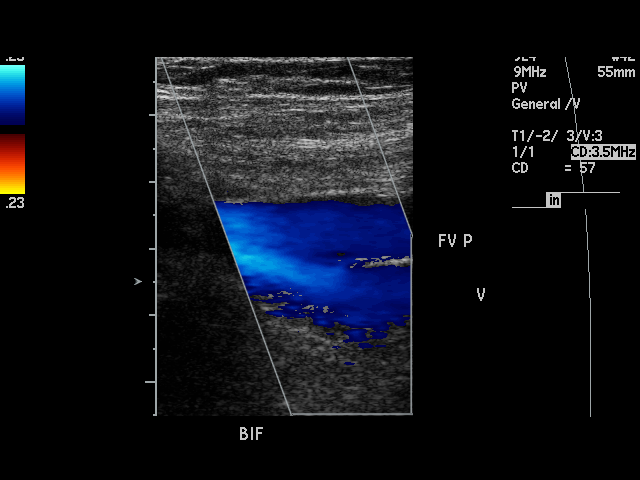
[im 39/47]
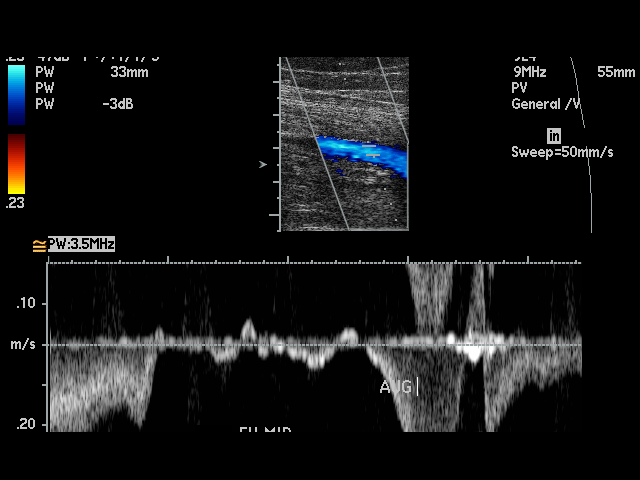
[im 41/47]
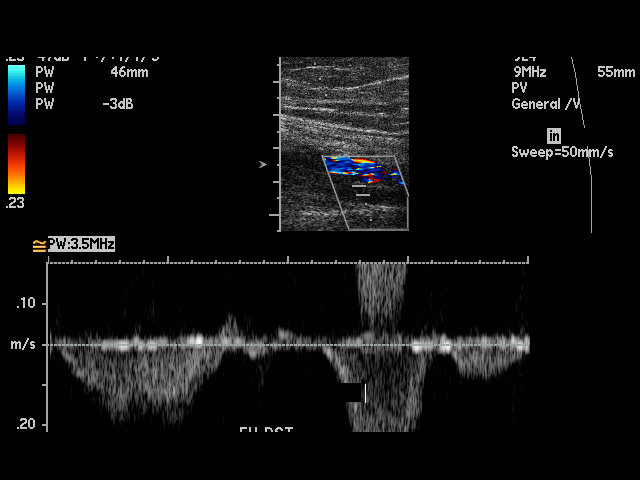
[im 43/47]
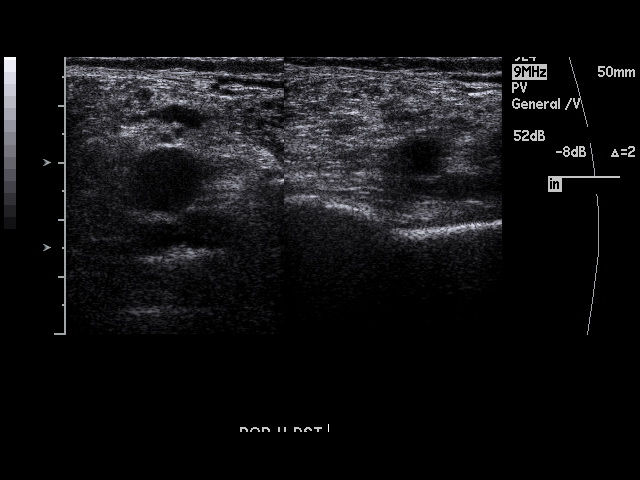
[im 47/47]
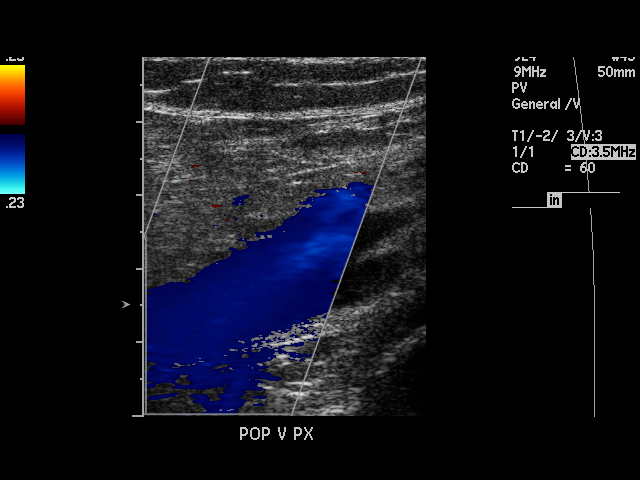

[17 of 24 positions shown; findings below may reference images not displayed]

PROCEDURE:     ETOIL - ETOIL DOPPLER VEINS BIL LEG EXTR  - April 19, 2011  [DATE]

RESULT:     The phasic, augmentation and Valsalva flow waveforms bilaterally
are normal in appearance. The femoral and popliteal veins bilaterally show
complete compressibility throughout their course. Bilateral Doppler
examination shows no occlusion or evidence for deep vein thrombosis.
IMPRESSION: No deep vein thrombosis is identified on either side.

## 2012-12-22 ENCOUNTER — Emergency Department: Payer: Self-pay | Admitting: Emergency Medicine

## 2012-12-22 LAB — BASIC METABOLIC PANEL
BUN: 14 mg/dL (ref 7–18)
Calcium, Total: 8.7 mg/dL (ref 8.5–10.1)
Chloride: 89 mmol/L — ABNORMAL LOW (ref 98–107)
Co2: 36 mmol/L — ABNORMAL HIGH (ref 21–32)
EGFR (African American): >60
EGFR (Non-African Amer.): >60
Potassium: 4.4 mmol/L (ref 3.5–5.1)

## 2012-12-22 LAB — CBC
HCT: 41.2 % (ref 40.0–52.0)
HGB: 13.3 g/dL (ref 13.0–18.0)
MCHC: 32.4 g/dL (ref 32.0–36.0)
Platelet: 174 10*3/uL (ref 150–440)
RBC: 4.88 10*6/uL (ref 4.40–5.90)
RDW: 16.5 % — ABNORMAL HIGH (ref 11.5–14.5)
WBC: 8 10*3/uL (ref 3.8–10.6)

## 2012-12-22 LAB — PROTIME-INR
INR: 1
Prothrombin Time: 13.6 secs (ref 11.5–14.7)

## 2012-12-22 LAB — APTT: Activated PTT: 41.7 secs — ABNORMAL HIGH (ref 23.6–35.9)

## 2013-01-02 ENCOUNTER — Telehealth: Payer: Self-pay

## 2013-01-02 NOTE — Telephone Encounter (Signed)
Patient was here for aorta and carotid duplex on 12/04/12.  He received results letter and follow-up appointment in the mail, but called to find out the percentage of stenosis in the carotid arteries.  I told him that there was <40% on the right and 40-59% on the left.  He did not have any further questions.  I reminded him of his follow-up appointments in June 2015.

## 2013-02-28 ENCOUNTER — Emergency Department: Payer: Self-pay | Admitting: Emergency Medicine

## 2013-03-20 ENCOUNTER — Encounter: Payer: Self-pay | Admitting: Family Medicine

## 2013-03-21 ENCOUNTER — Encounter: Payer: Self-pay | Admitting: Family Medicine

## 2013-06-20 ENCOUNTER — Encounter: Payer: Self-pay | Admitting: Podiatry

## 2013-06-25 ENCOUNTER — Ambulatory Visit (INDEPENDENT_AMBULATORY_CARE_PROVIDER_SITE_OTHER): Payer: Medicare Other

## 2013-06-25 ENCOUNTER — Ambulatory Visit (INDEPENDENT_AMBULATORY_CARE_PROVIDER_SITE_OTHER): Payer: Medicare Other | Admitting: Podiatry

## 2013-06-25 ENCOUNTER — Encounter: Payer: Self-pay | Admitting: Podiatry

## 2013-06-25 VITALS — BP 116/93 | HR 78 | Resp 16 | Ht 70.0 in | Wt 234.0 lb

## 2013-06-25 DIAGNOSIS — M79609 Pain in unspecified limb: Secondary | ICD-10-CM

## 2013-06-25 DIAGNOSIS — M79671 Pain in right foot: Secondary | ICD-10-CM

## 2013-06-25 DIAGNOSIS — M779 Enthesopathy, unspecified: Secondary | ICD-10-CM

## 2013-06-25 DIAGNOSIS — M205X2 Other deformities of toe(s) (acquired), left foot: Secondary | ICD-10-CM

## 2013-06-25 DIAGNOSIS — M205X9 Other deformities of toe(s) (acquired), unspecified foot: Secondary | ICD-10-CM

## 2013-06-25 DIAGNOSIS — B351 Tinea unguium: Secondary | ICD-10-CM

## 2013-06-25 DIAGNOSIS — M7750 Other enthesopathy of unspecified foot: Secondary | ICD-10-CM

## 2013-06-25 DIAGNOSIS — M778 Other enthesopathies, not elsewhere classified: Secondary | ICD-10-CM

## 2013-06-25 NOTE — Progress Notes (Signed)
Chad Huffman presents today complaining of pain to the first and second metatarsophalangeal joint area of his right foot he states it feels like it has fluid in it. It only bothers him when he is walking on the right foot. And states the majority of the discomfort is been in the toes. Is also complaining of pain about the first metatarsophalangeal joint of the left foot. States has been bothering her for quite some time is in considering surgical correction.  Objective: Vital signs are stable he is alert and oriented x3. Pulses are strongly palpable bilateral. Capillary fill time to digits one through 5 of the bilateral foot was noted to be immediate. There is no erythema edema saline is drainage or odor bilaterally. Radiographic evaluation demonstrates soft tissue increase in density periarticular Chad Huffman about the first and second metatarsophalangeal joint areas of the right foot this could be associated with scar tissue from previous surgeries. He also has severe osteoarthritis and joint space narrowing with obliteration of the joint space of the first metatarsophalangeal joint left. Degenerative joint disease of the left first metatarsophalangeal joint is noted.  Assessment: Osteoarthritis first metatarsophalangeal joint left foot. Capsulitis of the second metatarsophalangeal joint right foot. Care rule out neuropathy at this time.  Plan: Injected Kenalog and local anesthetic about the second metatarsophalangeal joint of the right foot today. We discussed surgical intervention regarding the left foot. We consented any for a Keller arthroplasty and single silicone implant. We will the consent form line bylined number by number giving her ample time to ask questions she saw fit regarding Keller arthroplasty with single silicone implant. I answered all these questions to the best of my ability in layman's terms. He signed Dr. pages of the consent form and I will followup with him in the near future.

## 2013-07-16 ENCOUNTER — Ambulatory Visit: Payer: Self-pay | Admitting: Gastroenterology

## 2013-07-18 LAB — PATHOLOGY REPORT

## 2013-07-27 ENCOUNTER — Encounter: Payer: Self-pay | Admitting: Podiatry

## 2013-07-27 DIAGNOSIS — M779 Enthesopathy, unspecified: Secondary | ICD-10-CM

## 2013-07-27 DIAGNOSIS — M2010 Hallux valgus (acquired), unspecified foot: Secondary | ICD-10-CM

## 2013-08-02 ENCOUNTER — Encounter: Payer: Self-pay | Admitting: Podiatry

## 2013-08-02 ENCOUNTER — Ambulatory Visit: Payer: Medicare Other

## 2013-08-02 ENCOUNTER — Ambulatory Visit (INDEPENDENT_AMBULATORY_CARE_PROVIDER_SITE_OTHER): Payer: Medicare Other

## 2013-08-02 ENCOUNTER — Other Ambulatory Visit: Payer: Self-pay | Admitting: Surgery

## 2013-08-02 ENCOUNTER — Ambulatory Visit (INDEPENDENT_AMBULATORY_CARE_PROVIDER_SITE_OTHER): Payer: Medicare Other | Admitting: Podiatry

## 2013-08-02 VITALS — BP 129/71 | HR 97 | Temp 98.8°F | Resp 16

## 2013-08-02 DIAGNOSIS — Z9889 Other specified postprocedural states: Secondary | ICD-10-CM

## 2013-08-02 DIAGNOSIS — I6529 Occlusion and stenosis of unspecified carotid artery: Secondary | ICD-10-CM

## 2013-08-02 NOTE — Progress Notes (Signed)
Dos 2.6.15 left foot keller arthroplasty , pt states that it is doing well.  He denies fever chills nausea vomiting muscle aches and pains. Continues to wear his Cam Walker on a regular basis. He states it has not come off since we applied a.  Objective: Vital signs are stable he is alert and oriented x3 dry sterile dressing intact once removed demonstrates mild edema no cellulitis drainage or odor mild erythema. Sutures appear to be intact margins are well coapted he is a fantastic range of motion dorsiflexion and plantar flexion of the first metatarsophalangeal joint of the left foot. Radiographic evaluation confirms a good position of the [facility] single [facility].  Assessment: Well-healing surgical foot.  Plan: Redressed today with a dry sterile compressive dressing followup with him in one week and transition him to a Darco shoe.

## 2013-08-06 NOTE — Progress Notes (Signed)
1. KELLER ARTHROPLASTY WITH IMPLANT LEFT   RX PERCOCET 10/325 #50 0 REFILLS ONE TO TWO TABLETS BY MOUTH EVERY 6 -8 HOURS AS NEEDED FOR PAIN PHENERGAN 25 MG #30 0 REFILLS ONE BY MOUTH EVERY 6 - 8 HOURS AS NEEDED FOR NAUSEA CLINDAMYCIN 150 MG #30 0 REFILLS ONE BY MOUTH 3 TIMES DAILY

## 2013-08-08 ENCOUNTER — Ambulatory Visit (INDEPENDENT_AMBULATORY_CARE_PROVIDER_SITE_OTHER): Payer: Medicare Other | Admitting: Podiatry

## 2013-08-08 VITALS — BP 131/71 | HR 72 | Temp 97.3°F | Resp 16 | Ht 69.0 in | Wt 234.0 lb

## 2013-08-08 DIAGNOSIS — I6529 Occlusion and stenosis of unspecified carotid artery: Secondary | ICD-10-CM

## 2013-08-08 DIAGNOSIS — Z9889 Other specified postprocedural states: Secondary | ICD-10-CM

## 2013-08-08 NOTE — Progress Notes (Signed)
Chad Huffman presents today 2 weeks status post Keller arthroplasty single silicone implant left foot. He denies fever chills nausea vomiting muscle aches or pains. Continues to wear his Cam Chad Huffman a regular basis.  Objective: Vital signs are stable he is alert and oriented x3. Dry sterile dressing was intact once removed demonstrates mild edema no erythema cellulitis drainage or odor. He has well coapted margins of the incision site with great range of motion on dorsiflexion and plantar flexion of the toe with no pain.  Assessment: Well-healing surgical foot left in 2 weeks status post Keller arthroplasty single silicone implant size #5.  Plan: I put him in a compression dressing today he will wear his Darco shoes that he has at home health followup with him in 2 weeks.

## 2013-08-23 ENCOUNTER — Ambulatory Visit (INDEPENDENT_AMBULATORY_CARE_PROVIDER_SITE_OTHER): Payer: Medicare Other | Admitting: Podiatry

## 2013-08-23 ENCOUNTER — Ambulatory Visit (INDEPENDENT_AMBULATORY_CARE_PROVIDER_SITE_OTHER): Payer: Medicare Other

## 2013-08-23 VITALS — BP 142/93 | HR 76 | Resp 16 | Ht 69.0 in | Wt 234.0 lb

## 2013-08-23 DIAGNOSIS — Z9889 Other specified postprocedural states: Secondary | ICD-10-CM

## 2013-08-23 NOTE — Progress Notes (Signed)
He presents today for week status post Keller arthroplasty with single silicone implant. He states it really haven't had any pain from the surgery. I very happy with the outcome. He denies fever chills nausea vomiting muscle aches and pains.  Objective: Vital signs are stable he is alert and oriented x3. He has mild overlying edema and mild erythema overlying the first metatarsophalangeal joint of the left foot. He has fantastic range of motion on dorsiflexion and plantar flexion both active and passive.  Assessment: Well-healing surgical foot left.  Plan: Followup with him in one month for x-rays.

## 2013-09-10 ENCOUNTER — Ambulatory Visit (INDEPENDENT_AMBULATORY_CARE_PROVIDER_SITE_OTHER): Payer: Medicare Other | Admitting: Podiatry

## 2013-09-10 ENCOUNTER — Ambulatory Visit (INDEPENDENT_AMBULATORY_CARE_PROVIDER_SITE_OTHER): Payer: Medicare Other

## 2013-09-10 ENCOUNTER — Encounter: Payer: Self-pay | Admitting: Podiatry

## 2013-09-10 VITALS — BP 129/71 | HR 97 | Resp 16

## 2013-09-10 DIAGNOSIS — Z9889 Other specified postprocedural states: Secondary | ICD-10-CM

## 2013-09-10 NOTE — Progress Notes (Signed)
Post op left foot keller arthroplasty with single silicone implant. Pt states it feels good. He denies fever chills nausea vomiting muscle aches and pains.  Objective: Vital signs are stable he is alert and oriented x3. Pulses are strongly palpable left foot. He has a great range of motion of the first metatarsophalangeal joint. Minimal edema no erythema cellulitis drainage or odor. Radiographic evaluation demonstrates severe well-placed Keller arthroplasty with single silicone implant and grommets.  Assessment: Well-healing surgical foot left.  Plan: Encouraged range of motion exercises and will followup with him in one month for his final set of x-rays he will call sooner if needed.

## 2013-10-08 ENCOUNTER — Ambulatory Visit (INDEPENDENT_AMBULATORY_CARE_PROVIDER_SITE_OTHER): Payer: Medicare Other

## 2013-10-08 ENCOUNTER — Ambulatory Visit (INDEPENDENT_AMBULATORY_CARE_PROVIDER_SITE_OTHER): Payer: Medicare Other | Admitting: Podiatry

## 2013-10-08 VITALS — Resp 16 | Ht 70.0 in | Wt 235.0 lb

## 2013-10-08 DIAGNOSIS — Z9889 Other specified postprocedural states: Secondary | ICD-10-CM

## 2013-10-08 DIAGNOSIS — M79609 Pain in unspecified limb: Secondary | ICD-10-CM

## 2013-10-08 DIAGNOSIS — B351 Tinea unguium: Secondary | ICD-10-CM

## 2013-10-08 NOTE — Progress Notes (Signed)
He presents today for followup of his [facility] arthroplasty with single silicone implant. He states this is doing great. I still get a little bit of swelling occasionally. But is also complaining of his nails being long and painful in the fact that he cannot trim them.  Objective: Vital signs are stable he is alert and oriented x3 Keller arthroplasty with single silicone implant perform February of 2015 appears to be healing quite nicely mild edema is present. Radiographic evaluation demonstrates a very nice Keller arthroplasty with single silicone implant with no complications. His nails are thick yellow dystrophic with mycotic and painful palpation.  Assessment: Well-healing surgical foot left we are releasing him for that. However he has pain in limb secondary to onychomycosis.  Plan: Debridement of nails in thickness and length as a covered service secondary to pain. His releases far as a IT consultantKeller arthroplasty with single silicone implant as.

## 2013-12-05 ENCOUNTER — Emergency Department: Payer: Self-pay | Admitting: Emergency Medicine

## 2013-12-07 ENCOUNTER — Encounter: Payer: Self-pay | Admitting: Family

## 2013-12-07 ENCOUNTER — Telehealth: Payer: Self-pay | Admitting: Surgery

## 2013-12-07 NOTE — Telephone Encounter (Signed)
Patient [facility] and had an ultrasound of his AAA. He was told by the PA that she had called our office with the results.  Mr Chad Huffman is scheduled for an ultrasound of his AAA and of his Carotid on 06/22 with our office. He wanted to make sure that he still needed an appt if he just had this [facility].  I spoke with Okey Regalarol, RN who stated that she just gave the PA the measurements of his last screen. She did not receive a report.   I advised Mr Chad Huffman that since he is scheduled for his carotid as well, we would still need to see him to evaluate his neck, however, I will leave his AAA scheduled. He [facility] and will bring that with him to the appointment for our vascular lab to determine if it needs to be repeated. He agreed with this plan. It is late in the day, and Medical Records would not be received until Monday, pt has no problem bringing his records with him.  dpm

## 2013-12-10 ENCOUNTER — Ambulatory Visit (HOSPITAL_COMMUNITY)
Admission: RE | Admit: 2013-12-10 | Discharge: 2013-12-10 | Disposition: A | Discharge status: Home / Self Care | Payer: Medicare Other | Source: Ambulatory Visit | Attending: Family | Admitting: Family

## 2013-12-10 ENCOUNTER — Encounter: Payer: Self-pay | Admitting: Family

## 2013-12-10 ENCOUNTER — Ambulatory Visit (INDEPENDENT_AMBULATORY_CARE_PROVIDER_SITE_OTHER): Payer: Medicare Other | Admitting: Family

## 2013-12-10 ENCOUNTER — Emergency Department: Payer: Self-pay | Admitting: Emergency Medicine

## 2013-12-10 ENCOUNTER — Ambulatory Visit (INDEPENDENT_AMBULATORY_CARE_PROVIDER_SITE_OTHER)
Admission: RE | Admit: 2013-12-10 | Discharge: 2013-12-10 | Disposition: A | Discharge status: Home / Self Care | Payer: Medicare Other | Source: Ambulatory Visit | Attending: Family | Admitting: Family

## 2013-12-10 VITALS — BP 104/76 | HR 80 | Resp 16 | Ht 70.5 in | Wt 236.0 lb

## 2013-12-10 DIAGNOSIS — I714 Abdominal aortic aneurysm, without rupture, unspecified: Secondary | ICD-10-CM | POA: Insufficient documentation

## 2013-12-10 DIAGNOSIS — I6523 Occlusion and stenosis of bilateral carotid arteries: Secondary | ICD-10-CM

## 2013-12-10 DIAGNOSIS — I6529 Occlusion and stenosis of unspecified carotid artery: Secondary | ICD-10-CM

## 2013-12-10 DIAGNOSIS — M79601 Pain in right arm: Secondary | ICD-10-CM

## 2013-12-10 DIAGNOSIS — M79609 Pain in unspecified limb: Secondary | ICD-10-CM

## 2013-12-10 DIAGNOSIS — Z48812 Encounter for surgical aftercare following surgery on the circulatory system: Secondary | ICD-10-CM | POA: Insufficient documentation

## 2013-12-10 NOTE — Progress Notes (Signed)
[facility] [place] HISTORY AND PHYSICAL   MRN : [id number]  History of Present Illness:   Chad Huffman is a 68 y.o. male patient of Dr. Myra GianottiBrabham who is status post a AAA stent repair in 2009. Patient's also followed for carotid stenosis. He returns today for follow up. 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 He denies history of stroke or TIA. 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 He denies legs weakness, uses a cane for stability.  He denies non healing wounds in feet/legs. He has right flank pain that has been associated with gallstones, denies any new back or abdominal pain. He denies any known cardiac issues.  Pt Diabetic: No Pt smoker: former smoker, quit in 2008  Pt meds include: Statin :No, states his cholesterol has been good. Betablocker: No ASA: Yes Other anticoagulants/antiplatelets: no  Current Outpatient Prescriptions  Medication Sig Dispense Refill  . ALPRAZolam (XANAX) 1 MG tablet 2 (two) times daily.      Marland Kitchen. aspirin 81 MG tablet Take 81 mg by mouth daily.      . baclofen (LIORESAL) 10 MG tablet       . desonide (DESOWEN) 0.05 % cream Apply 0.05 % topically as needed.      Marland Kitchen. ketoconazole (NIZORAL) 2 % shampoo       . KRILL OIL PO Take by mouth daily.      Marland Kitchen. levothyroxine (SYNTHROID, LEVOTHROID) 50 MCG tablet       . magnesium hydroxide (MILK OF MAGNESIA) 400 MG/5ML suspension Take by mouth daily as needed for mild constipation.      . Multiple Vitamin (MULTIVITAMIN) tablet Take 1 tablet by mouth daily.      . SYMBICORT 160-4.5 MCG/ACT inhaler       . vitamin B-12 (CYANOCOBALAMIN) 1000 MCG tablet Take 1,000  mcg by mouth every 30 (thirty) days.      . bisacodyl (DULCOLAX) 5 MG EC tablet Take 5 mg by mouth daily as needed.      . brimonidine (ALPHAGAN) 0.15 % ophthalmic solution 1 drop 2 (two) times daily.      . furosemide (LASIX) 20 MG tablet       . Lactobacillus (ACIDOPHILUS PO) Take by mouth daily.      Marland Kitchen. losartan (COZAAR) 50 MG tablet       . losartan-hydrochlorothiazide (HYZAAR) 50-12.5 MG per tablet       . nortriptyline (PAMELOR) 25 MG capsule Take 25 mg by mouth 2 (two) times daily.      . promethazine (PHENERGAN) 12.5 MG tablet Take 2 tablets (25 mg total) by mouth every 6 (six) hours as needed for nausea.  30 tablet  0  . sertraline (ZOLOFT) 50 MG tablet Take 50 mg by mouth daily.       No current facility-administered medications for this visit.    Past Medical History  Diagnosis Date  . Antalgic gait   . Hypertension   . Lumbago   . COPD (chronic obstructive pulmonary disease)   . Complication of anesthesia     pt has had cervical fusion-limited neck flextion  .  Depression   . DJD (degenerative joint disease)     Right shoulder  . Carotid artery occlusion     Past Surgical History  Procedure Laterality Date  . Tonsillectomy    . Carpal tunnel release      bilateral CTS  . Abdominal aortic aneurysm repair  2009    Endovascular AAA repair  . Pilonidal cyst / sinus excision    . Spine surgery  09/15/11    Scar tissue removed- back  . Spine surgery  09/15/11    Scar tissue removed- back  . Neck surgery    . Rotary cuff    . Hammer toe surgery  12/10/2011    Procedure: HAMMER TOE CORRECTION;  Surgeon: Ernestene Kiel, DPM;  Location: [facility];  Service: Podiatry;  Laterality: Right;  Right hammertoe repair second right with 2.5x42 orthopro screw   . Metatarsal osteotomy  12/10/2011    Procedure: METATARSAL OSTEOTOMY;  Surgeon: Ernestene Kiel, DPM;  Location: [facility];  Service: Podiatry;  Laterality: Right;  Right Keller arthroplasty with size 5  silicone implant right great toe; second metatarsal osteotomy with 2.0x14 screw    Social History History  Substance Use Topics  . Smoking status: Former Smoker    Quit date: 08/30/2006  . Smokeless tobacco: Never Used  . Alcohol Use: 1.2 oz/week    2 Cans of beer per week     Comment: occasional use    Family History Family History  Problem Relation Age of Onset  . Cancer Mother   . Hypertension Brother   . Heart attack Father     Allergies  Allergen Reactions  . Ampicillin Swelling  . Cyclobenzaprine   . Gabapentin Rash     REVIEW OF SYSTEMS: See HPI for pertinent positives and negatives.  Physical Examination Filed Vitals:   12/10/13 1143 12/10/13 1146  BP: 108/73 104/76  Pulse: 76 80  Resp:  16  Height:  5' 10.5" (1.791 m)  Weight:  236 lb (107.049 kg)  SpO2:  94%   Body mass index is 33.37 kg/(m^2).  General:  WDWN obese male in NAD Gait: antalgic, using cane HENT: WNL Eyes: Pupils equal Pulmonary: normal non-labored breathing , without Rales, rhonchi,  wheezing Cardiac: RRR, no murmurs detected  Abdomen: soft, NT, no masses Skin: no rashes, ulcers noted;  no Gangrene , no cellulitis; no open wounds;   VASCULAR EXAM  Carotid Bruits Left Right   Negative Negative           Aorta is not palpable Bilateral radial pulses are 2+ palpable                  VASCULAR EXAM: Extremities without ischemic changes  without Gangrene; without open wounds.                                                                                                          LE Pulses LEFT RIGHT       FEMORAL  not palpable  not palpable  POPLITEAL  not palpable   not palpable       POSTERIOR TIBIAL   palpable   not palpable        DORSALIS PEDIS      ANTERIOR TIBIAL not palpable   palpable     Musculoskeletal: no muscle wasting or atrophy; no edema, pt holds neck stiffly, little rotation Neurologic: A&O X 3; Appropriate Affect ;  SENSATION: normal; MOTOR  FUNCTION: 4/5 right upper and lower extremities, 5/5 left upper and lower extremities, CN 2-12 intact.  Speech is fluent/normal   Significant Diagnostic Studies: 12/05/2013 [facility]ro[facility] [place] [facility]: Indication: back pain, hx of AAA stent repair 2009 IMPRESSION: Fusiform dilation of the mid abdominal aorta measuring up to 3.5 x 3.3 cm. The radiologist was uncertain if this dilated segment of the aorta is above the aneurysm repair because the stent os difficult to visualize.  2.5 cm gallstone incidentally seen.  Non-Invasive Vascular Imaging:  CEREBROVASCULAR DUPLEX EVALUATION (12/10/2013)    INDICATION: Carotid disease    PREVIOUS INTERVENTION(S):     DUPLEX EXAM:     RIGHT  LEFT  Peak Systolic Velocities (cm/s) End Diastolic Velocities (cm/s) Plaque LOCATION Peak Systolic Velocities (cm/s) End Diastolic Velocities (cm/s) Plaque  75 12  CCA PROXIMAL 96 22   66 12  CCA MID 90 23 HT  63 17 HT CCA DISTAL 76 19 HT  88 13 HT ECA 174 15 HT  150 52 HT ICA PROXIMAL 76 23 HT  109 45  ICA MID 57 20   110 41  ICA DISTAL 66 25     2.4 ICA / CCA Ratio (PSV) 1.0  Antegrade Vertebral Flow Antegrade  136 Brachial Systolic Pressure (mmHg) 144  Multiphasic (subclavian artery) Brachial Artery Waveforms Multiphasic (subclavian artery)    Plaque Morphology:  HM = Homogeneous, HT = Heterogeneous, CP = Calcific Plaque, SP = Smooth Plaque, IP = Irregular Plaque     ADDITIONAL FINDINGS:   No significant stenosis of the right external or bilateral common carotid arteries.   Left external carotid artery stenosis noted.    IMPRESSION: Doppler velocities suggest a 40-59% stenosis of the right proximal internal carotid artery and a less than 40% stenosis of the left proximal internal carotid artery.    Compared to the previous exam:  No significant change in the bilateral internal carotid artery velocities when compared to the previous exam on 12/04/12.         ASSESSMENT:  Chad Huffman is a 68 y.o. male who is status post a AAA stent repair in 2009. Patient's also followed for carotid stenosis. Fusiform dilation of the mid abdominal aorta measuring up to 3.5 x 3.3 cm, decreased in size from a year ago. On 12/04/12 the largest diameter of the AAA was 4.47 cm. The radiologist was uncertain if this dilated segment of the aorta is above the aneurysm repair because the stent os difficult to visualize.   Asymptomatic 40-59% stenosis of the right proximal internal carotid artery and a less than 40% stenosis of the left proximal internal carotid artery.  PLAN:   Based on today's exam and non-invasive vascular lab results, the patient will follow up in 1 year with the following tests AAA Duplex, carotid Duplex. I discussed in depth with the patient the nature of atherosclerosis, and emphasized the importance of maximal medical management including strict control of blood pressure, blood glucose, and lipid levels, obtaining regular exercise, and cessation of smoking.  The patient is  aware that without maximal medical management the underlying atherosclerotic disease process will progress, limiting the benefit of any interventions. The patient was given information about stroke prevention and what symptoms should prompt the patient to seek immediate medical care. The patient was given information about AAA including signs, symptoms, treatment,  what symptoms should prompt the patient to seek immediate medical care, and how to minimize the risk of enlargement and rupture of aneurysms.  Thank you for allowing us to participate in this patient's care.  Charisse MarchSuzanne Nickel, RN, MSN, FNP-C [facility] Office: [phone]  Clinic MD: Myra GianottiBrabham 12/10/2013 12:13 PM

## 2013-12-10 NOTE — Patient Instructions (Signed)
Stroke Prevention Some medical conditions and behaviors are associated with an increased chance of having a stroke. You may prevent a stroke by making healthy choices and managing medical conditions. HOW CAN I REDUCE MY RISK OF HAVING A STROKE?   Stay physically active. Get at least 30 minutes of activity on most or all days.  Do not smoke. It may also be helpful to avoid exposure to secondhand smoke.  Limit alcohol use. Moderate alcohol use is considered to be:  No more than 2 drinks per day for men.  No more than 1 drink per day for nonpregnant women.  Eat healthy foods. This involves  Eating 5 or more servings of fruits and vegetables a day.  Following a diet that addresses high blood pressure (hypertension), high cholesterol, diabetes, or obesity.  Manage your cholesterol levels.  A diet low in saturated fat, trans fat, and cholesterol and high in fiber may control cholesterol levels.  Take any prescribed medicines to control cholesterol as directed by your health care provider.  Manage your diabetes.  A controlled-carbohydrate, controlled-sugar diet is recommended to manage diabetes.  Take any prescribed medicines to control diabetes as directed by your health care provider.  Control your hypertension.  A low-salt (sodium), low-saturated fat, low-trans fat, and low-cholesterol diet is recommended to manage hypertension.  Take any prescribed medicines to control hypertension as directed by your health care provider.  Maintain a healthy weight.  A reduced-calorie, low-sodium, low-saturated fat, low-trans fat, low-cholesterol diet is recommended to manage weight.  Stop drug abuse.  Avoid taking birth control pills.  Talk to your health care provider about the risks of taking birth control pills if you are over 35 years old, smoke, get migraines, or have ever had a blood clot.  Get evaluated for sleep disorders (sleep apnea).  Talk to your health care provider about  getting a sleep evaluation if you snore a lot or have excessive sleepiness.  Take medicines as directed by your health care provider.  For some people, aspirin or blood thinners (anticoagulants) are helpful in reducing the risk of forming abnormal blood clots that can lead to stroke. If you have the irregular heart rhythm of atrial fibrillation, you should be on a blood thinner unless there is a good reason you cannot take them.  Understand all your medicine instructions.  Make sure that other other conditions (such as anemia or atherosclerosis) are addressed. SEEK IMMEDIATE MEDICAL CARE IF:   You have sudden weakness or numbness of the face, arm, or leg, especially on one side of the body.  Your face or eyelid droops to one side.  You have sudden confusion.  You have trouble speaking (aphasia) or understanding.  You have sudden trouble seeing in one or both eyes.  You have sudden trouble walking.  You have dizziness.  You have a loss of balance or coordination.  You have a sudden, severe headache with no known cause.  You have new chest pain or an irregular heartbeat. Any of these symptoms may represent a serious problem that is an emergency. Do not wait to see if the symptoms will go away. Get medical help at once. Call your local emergency services  (911 in [place] Do not drive yourself to the hospital. Document Released: 07/15/2004 Document Revised: 03/28/2013 Document Reviewed: 12/08/2012 ExitCare Patient Information 2015 [employer]. This information is not intended to replace advice given to you by your health care provider. Make sure you discuss any questions you have with your health   care provider.   Abdominal Aortic Aneurysm An aneurysm is a weakened or damaged part of an artery wall that bulges from the normal force of blood pumping through the body. An abdominal aortic aneurysm is an aneurysm that occurs in the lower part of the aorta, the main artery of the body.   The major concern with an abdominal aortic aneurysm is that it can enlarge and burst (rupture) or blood can flow between the layers of the wall of the aorta through a tear (aorticdissection). Both of these conditions can cause bleeding inside the body and can be life threatening unless diagnosed and treated promptly. CAUSES  The exact cause of an abdominal aortic aneurysm is unknown. Some contributing factors are:   A hardening of the arteries caused by the buildup of fat and other substances in the lining of a blood vessel (arteriosclerosis).  Inflammation of the walls of an artery (arteritis).   Connective tissue diseases, such as Marfan syndrome.   Abdominal trauma.   An infection, such as syphilis or staphylococcus, in the wall of the aorta (infectious aortitis) caused by bacteria. RISK FACTORS  Risk factors that contribute to an abdominal aortic aneurysm may include:  Age older than 60 years.   High blood pressure (hypertension).  Male gender.  Ethnicity (white race).  Obesity.  Family history of aneurysm (first degree relatives only).  Tobacco use. PREVENTION  The following healthy lifestyle habits may help decrease your risk of abdominal aortic aneurysm:  Quitting smoking. Smoking can raise your blood pressure and cause arteriosclerosis.  Limiting or avoiding alcohol.  Keeping your blood pressure, blood sugar level, and cholesterol levels within normal limits.  Decreasing your salt intake. In somepeople, too much salt can raise blood pressure and increase your risk of abdominal aortic aneurysm.  Eating a diet low in saturated fats and cholesterol.  Increasing your fiber intake by including whole grains, vegetables, and fruits in your diet. Eating these foods may help lower blood pressure.  Maintaining a healthy weight.  Staying physically active and exercising regularly. SYMPTOMS  The symptoms of abdominal aortic aneurysm may vary depending on the size  and rate of growth of the aneurysm.Most grow slowly and do not have any symptoms. When symptoms do occur, they may include:  Pain (abdomen, side, lower back, or groin). The pain may vary in intensity. A sudden onset of severe pain may indicate that the aneurysm has ruptured.  Feeling full after eating only small amounts of food.  Nausea or vomiting or both.  Feeling a pulsating lump in the abdomen.  Feeling faint or passing out. DIAGNOSIS  Since most unruptured abdominal aortic aneurysms have no symptoms, they are often discovered during diagnostic exams for other conditions. An aneurysm may be found during the following procedures:  Ultrasonography (A one-time screening for abdominal aortic aneurysm by ultrasonography is also recommended for all men aged 65-75 years who have ever smoked).  X-ray exams.  A computed tomography (CT).  Magnetic resonance imaging (MRI).  Angiography or arteriography. TREATMENT  Treatment of an abdominal aortic aneurysm depends on the size of your aneurysm, your age, and risk factors for rupture. Medication to control blood pressure and pain may be used to manage aneurysms smaller than 6 cm. Regular monitoring for enlargement may be recommended by your caregiver if:  The aneurysm is 3-4 cm in size (an annual ultrasonography may be recommended).  The aneurysm is 4-4.5 cm in size (an ultrasonography every 6 months may be recommended).  The aneurysm is   larger than 4.5 cm in size (your caregiver may ask that you be examined by a vascular surgeon). If your aneurysm is larger than 6 cm, surgical repair may be recommended. There are two main methods for repair of an aneurysm:   Endovascular repair (a minimally invasive surgery). This is done most often.  Open repair. This method is used if an endovascular repair is not possible. Document Released: 03/17/2005 Document Revised: 10/02/2012 Document Reviewed: 07/07/2012 ExitCare Patient Information 2015  [employer]. This information is not intended to replace advice given to you by your health care provider. Make sure you discuss any questions you have with your health care provider.  

## 2013-12-13 ENCOUNTER — Emergency Department: Payer: Self-pay | Admitting: Internal Medicine

## 2013-12-13 ENCOUNTER — Encounter: Payer: Self-pay | Admitting: *Deleted

## 2013-12-13 NOTE — Progress Notes (Signed)
Joyce GrossKay,  Would you mind calling this pt and leaving a message for him that I spoke with Dr. Myra GianottiBrabham (in addition to this message) and that this pt's AAA stent is not affected by the metal in his stent, only iron would be attracted by the magnet in the MRI and stents have no iron, they have other metals and synthetic materials.  Thank you,  Rosalita ChessmanSuzanne  Previous Messages     ----- Message -----  From: Nada LibmanVance W Brabham, MD  Sent: 12/10/2013 1:57 PM  To: Carma LairSuzanne L Nickel, NP  Subject: RE: MRI OK or not with his 2009 AAA stent?   Ok   ----- Message -----  From: Annye RuskSuzanne L Nickel, NP  Sent: 12/10/2013 12:49 PM  To: Nada LibmanVance W Brabham, MD  Subject: MRI OK or not with his 2009 AAA stent?   Pt. Wants to know the above.  Thank you,  Rosalita ChessmanSuzanne         Patient requested that I send this information along with his operative note from 2009 to Dr. Mindi JunkerWesley Hsu.  Evidently he is to have a MRI in the future.  Patient had a Cook Zenith EVAR stent.    Rudean HaskellKay McChesney , RN

## 2013-12-16 ENCOUNTER — Emergency Department: Payer: Self-pay | Admitting: Emergency Medicine

## 2014-01-07 ENCOUNTER — Ambulatory Visit: Payer: Medicare Other | Admitting: Podiatry

## 2014-02-25 ENCOUNTER — Emergency Department: Payer: Self-pay | Admitting: Student

## 2014-04-01 ENCOUNTER — Emergency Department: Payer: Self-pay | Admitting: Emergency Medicine

## 2014-04-07 ENCOUNTER — Emergency Department: Payer: Self-pay | Admitting: Student

## 2014-04-11 ENCOUNTER — Emergency Department: Payer: Self-pay | Admitting: Emergency Medicine

## 2014-04-23 IMAGING — RF DG FLUORO GUIDE NDL PLC/BX
3 series · 3 of 3 positions shown · IV contrast (omnipaque)
Comparison: none

CLINICAL DATA: Right shoulder pain.

RIGHT SHOULDER INJECTION UNDER FLUOROSCOPY
TECHNIQUE: An appropriate skin entrance site was determined.  The
site was marked, prepped with Betadine, draped in the usual sterile
fashion, and infiltrated locally with buffered Lidocaine.  22 gauge
spinal needle was advanced to the superomedial margin of the
humeral head under intermittent fluoroscopy.  1 ml of Lidocaine
injected easily.  10 ml of Omnipaque 300 was then used to opacify
the right shoulder capsule.  No immediate complication.
Fluoroscopy Time: 1 minute and 8 seconds.

[Series 4: (hospital) · 1 of 1 slices shown (1 of 3)]
[im 1/1]
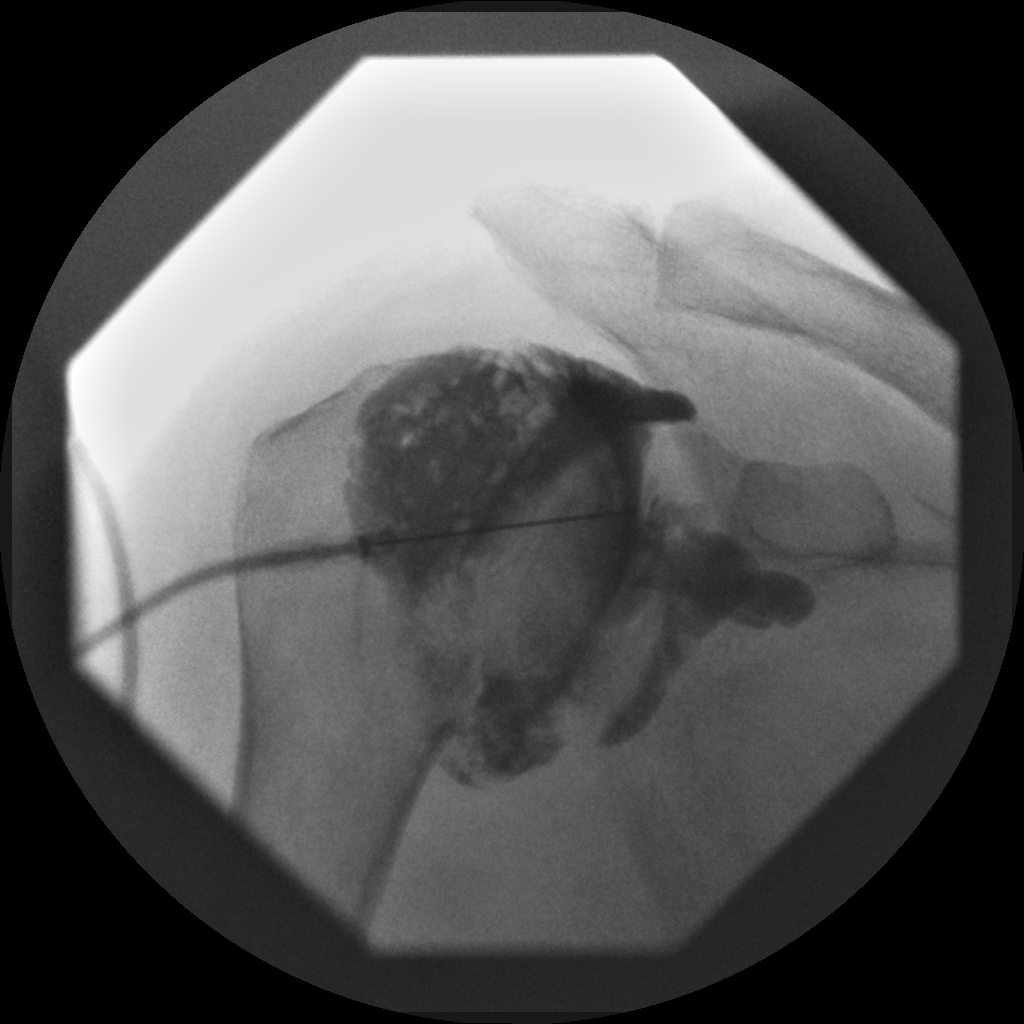

[Series 5: (hospital) · 1 of 1 slices shown (2 of 3)]
[im 1/1]
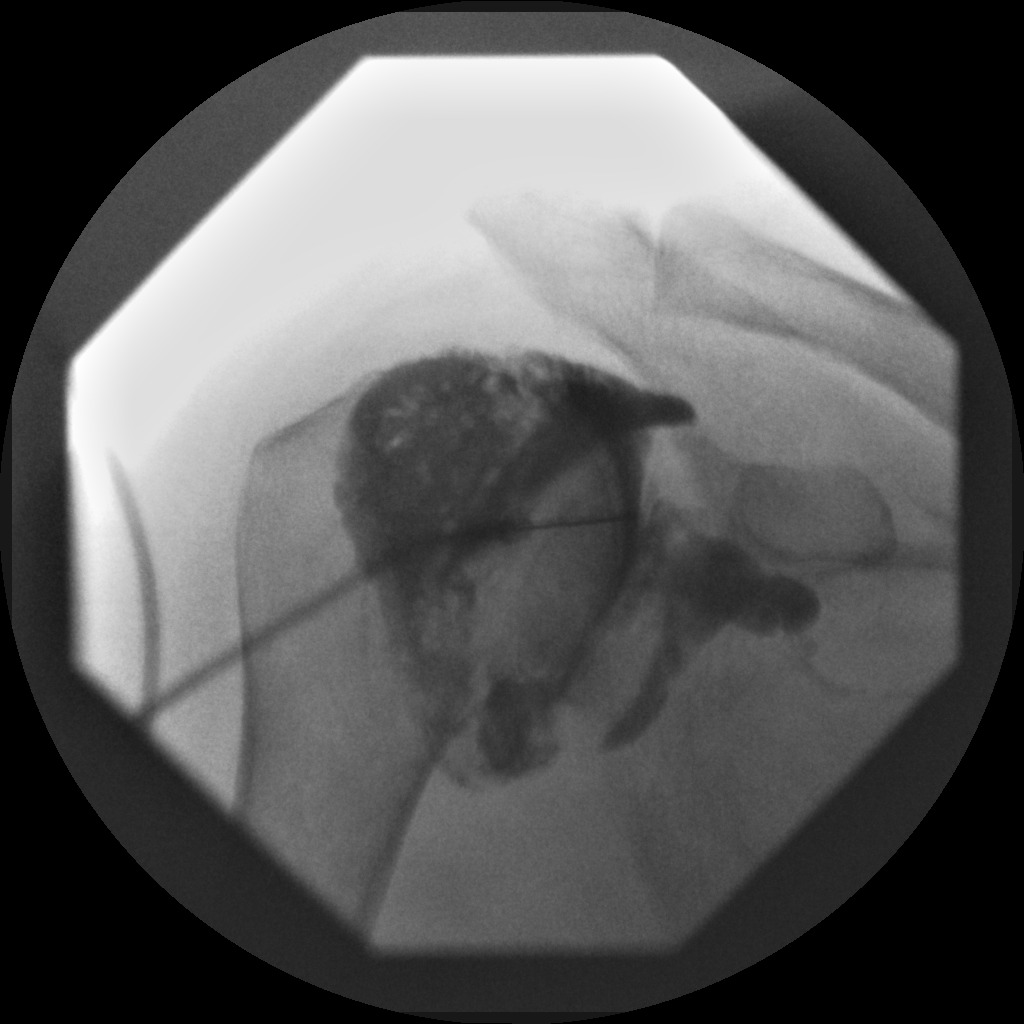

[Series 6: (hospital) · 1 of 1 slices shown (3 of 3)]
[im 1/1]
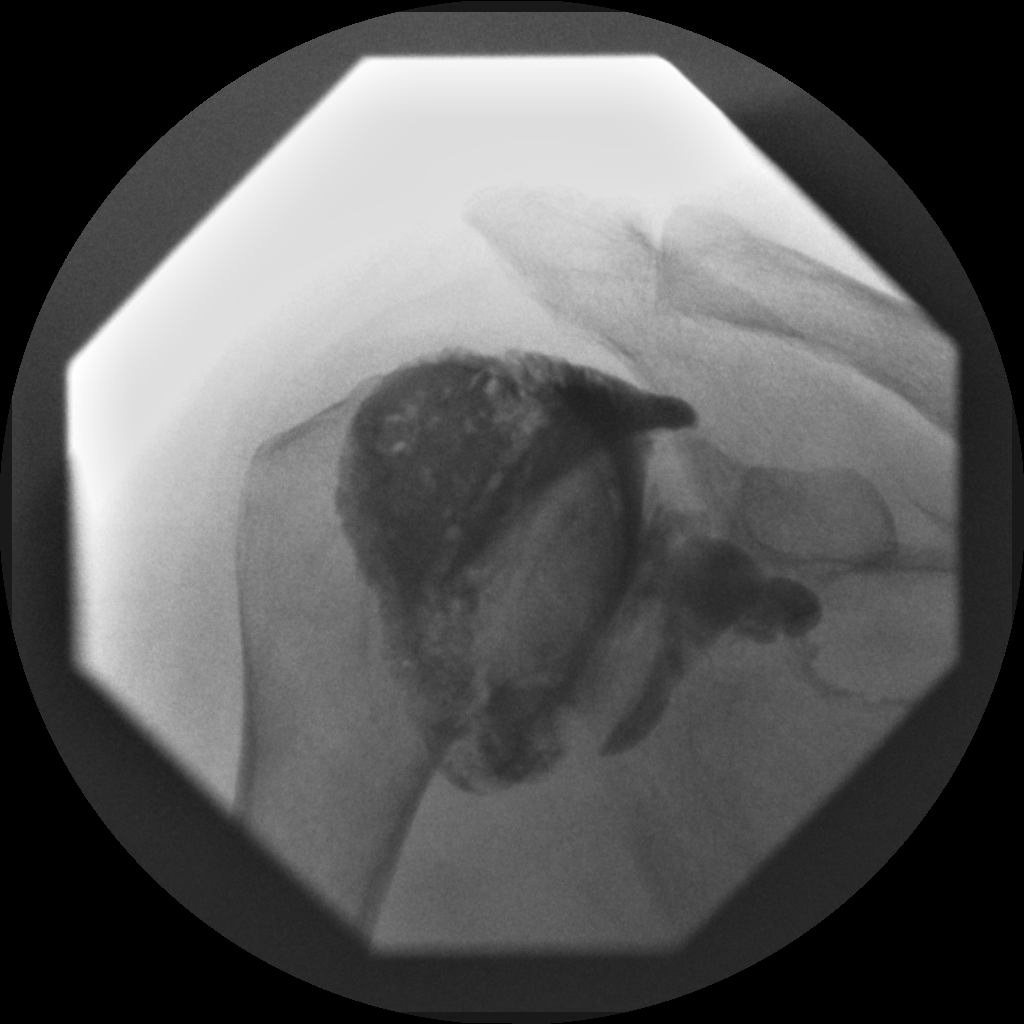

[3 of 3 positions shown; findings below may reference images not displayed]

IMPRESSION: Technically successful right shoulder injection for CT.

## 2014-04-23 IMAGING — CT CT SHOULDER*R* W/CM
1 of 4 series · 8 of 14 positions shown, 10 images · non-contrast
Comparison: Injection images same date.

CLINICAL DATA: Right shoulder pain with weakness and limited range
of motion.  History of rotator cuff repair in 2512.

CT ARTHROGRAPHY OF THE RIGHT SHOULDER
TECHNIQUE: Multidetector CT imaging of the right shoulder was
performed according to the standard protocol following intra-
articular dilute contrast administration under fluoroscopy.
Multiplanar CT image reconstructions were also generated.

[Series 102: thin soft · axial · 0.53mm/px · z∈[-201,-38]mm · 8 of 700 slices shown, 10 images]
[im 78/700  soft-tissue]
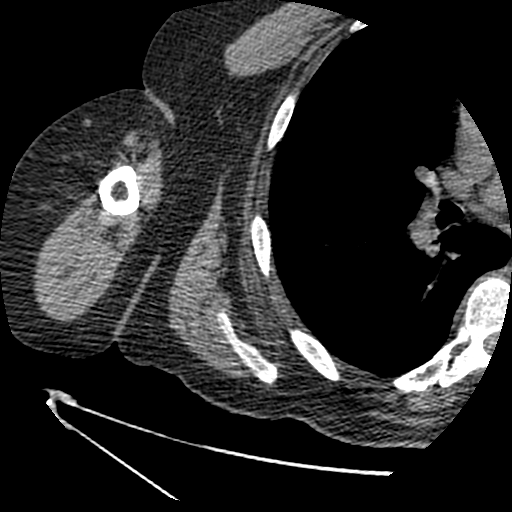
[im 78/700  bone]
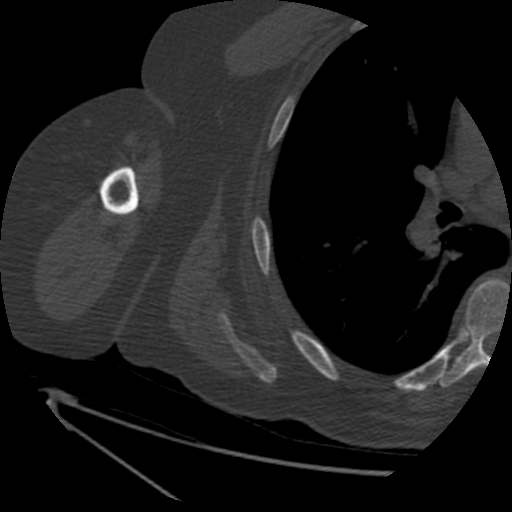
[im 156/700  bone]
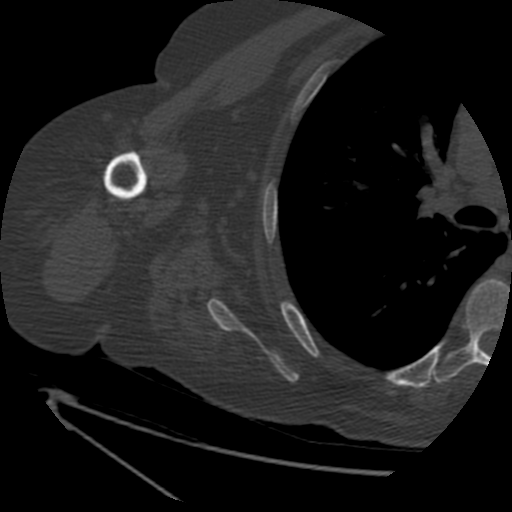
[im 234/700  bone]
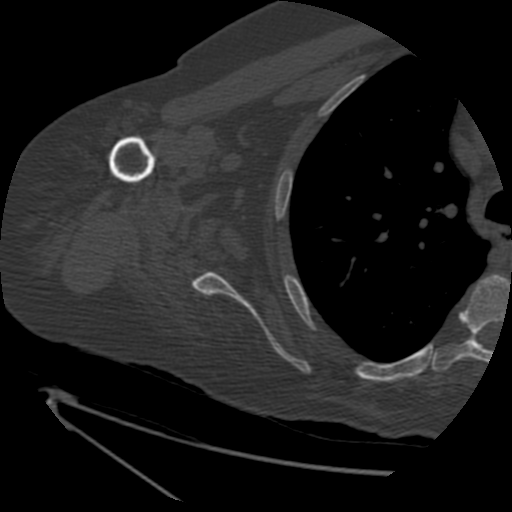
[im 311/700  bone]
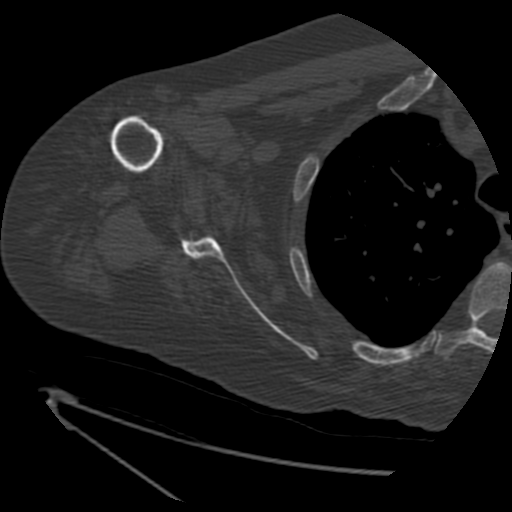
[im 389/700  soft-tissue]
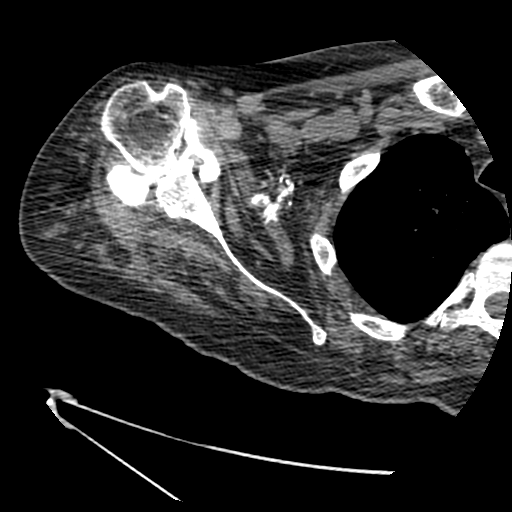
[im 389/700  bone]
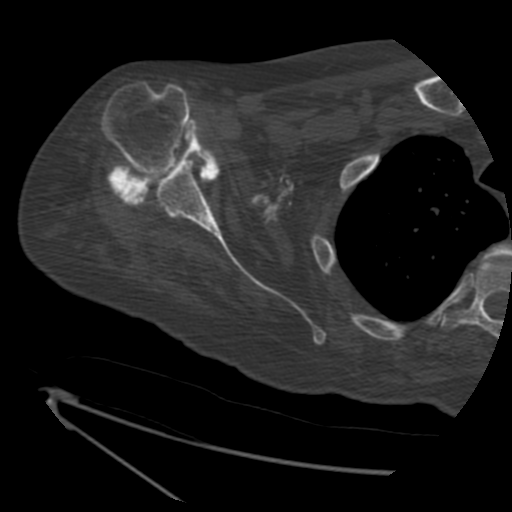
[im 467/700  bone]
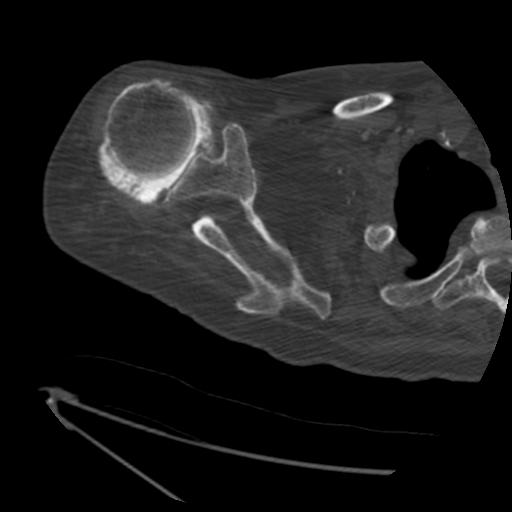
[im 544/700  bone]
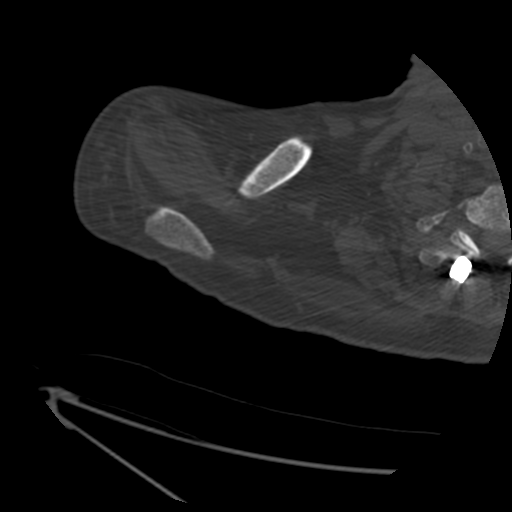
[im 622/700  bone]
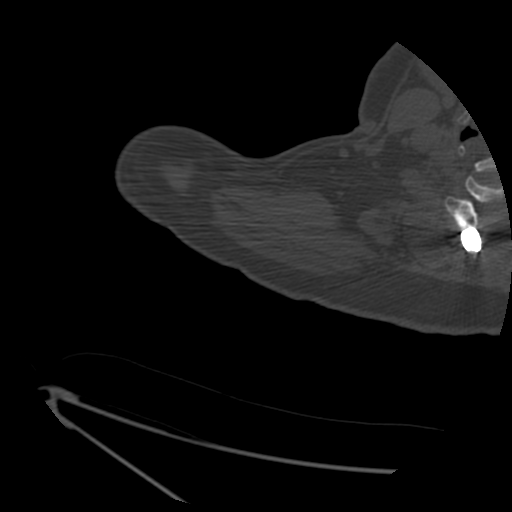

[8 of 14 positions shown; findings below may reference images not displayed]

FINDINGS: The contrast is retained within the glenohumeral joint.
There is no contrast within the subacromial - subdeltoid bursa.
There is no evidence of recurrent full-thickness rotator cuff tear.
There is severe muscular atrophy surrounding the right shoulder,
especially involving the deltoid, infraspinatus and subscapularis
muscles.

The joint capsule is diffusely irregular consistent with synovitis.
No discrete intra-articular loose bodies are identified.

There are glenohumeral degenerative changes with subchondral cyst
formation posteriorly in the glenoid and chondral thinning
inferiorly.  No displaced labral tear is identified. The long head
of the biceps tendon is not visualized and is likely ruptured or
previously resected.  The humeral head is located.  There is no
Hill-Sachs deformity.

The acromion is type 1.  The acromioclavicular joint appears
normal.
IMPRESSION: 1.  No evidence of recurrent full-thickness rotator cuff tear.
2.  Severe diffuse muscular atrophy surrounding shoulder as
described.
3.  Glenohumeral degenerative changes with rupture or prior
tenectomy of the long head of the biceps tendon.
4.  Shoulder joint synovial irregularity consistent with synovitis.

## 2014-05-24 ENCOUNTER — Emergency Department: Payer: Self-pay | Admitting: Emergency Medicine

## 2014-05-24 LAB — COMPREHENSIVE METABOLIC PANEL
ALBUMIN: 3.4 g/dL (ref 3.4–5.0)
Alkaline Phosphatase: 87 U/L
Anion Gap: 7 (ref 7–16)
BUN: 17 mg/dL (ref 7–18)
Bilirubin,Total: 1 mg/dL (ref 0.2–1.0)
CALCIUM: 8.4 mg/dL — AB (ref 8.5–10.1)
CHLORIDE: 98 mmol/L (ref 98–107)
CREATININE: 1.14 mg/dL (ref 0.60–1.30)
Co2: 33 mmol/L — ABNORMAL HIGH (ref 21–32)
EGFR (Non-African Amer.): >60
GLUCOSE: 110 mg/dL — AB (ref 65–99)
Osmolality: 278 (ref 275–301)
Potassium: 4.1 mmol/L (ref 3.5–5.1)
SGOT(AST): 23 U/L (ref 15–37)
SGPT (ALT): 19 U/L
Sodium: 138 mmol/L (ref 136–145)
TOTAL PROTEIN: 6.8 g/dL (ref 6.4–8.2)

## 2014-05-24 LAB — DRUG SCREEN, URINE
Amphetamines, Ur Screen: NEGATIVE (ref ?–1000)
Barbiturates, Ur Screen: NEGATIVE (ref ?–200)
Benzodiazepine, Ur Scrn: NEGATIVE (ref ?–200)
COCAINE METABOLITE, UR ~~LOC~~: NEGATIVE (ref ?–300)
Cannabinoid 50 Ng, Ur ~~LOC~~: NEGATIVE (ref ?–50)
MDMA (ECSTASY) UR SCREEN: NEGATIVE (ref ?–500)
Methadone, Ur Screen: NEGATIVE (ref ?–300)
Opiate, Ur Screen: NEGATIVE (ref ?–300)
Phencyclidine (PCP) Ur S: NEGATIVE (ref ?–25)
Tricyclic, Ur Screen: NEGATIVE (ref ?–1000)

## 2014-05-24 LAB — CBC
HCT: 40.7 % (ref 40.0–52.0)
HGB: 13.2 g/dL (ref 13.0–18.0)
MCH: 31.6 pg (ref 26.0–34.0)
MCHC: 32.3 g/dL (ref 32.0–36.0)
MCV: 98 fL (ref 80–100)
Platelet: 162 10*3/uL (ref 150–440)
RBC: 4.17 10*6/uL — AB (ref 4.40–5.90)
RDW: 15 % — AB (ref 11.5–14.5)
WBC: 8.9 10*3/uL (ref 3.8–10.6)

## 2014-05-24 LAB — URINALYSIS, COMPLETE
Bacteria: NONE SEEN
Bilirubin,UR: NEGATIVE
GLUCOSE, UR: NEGATIVE mg/dL (ref 0–75)
Ketone: NEGATIVE
LEUKOCYTE ESTERASE: NEGATIVE
NITRITE: NEGATIVE
Ph: 5 (ref 4.5–8.0)
Protein: NEGATIVE
RBC,UR: NONE SEEN /HPF (ref 0–5)
SQUAMOUS EPITHELIAL: NONE SEEN
Specific Gravity: 1.004 (ref 1.003–1.030)
WBC UR: <1 /HPF (ref 0–5)

## 2014-05-24 LAB — TROPONIN I

## 2014-05-24 LAB — ETHANOL

## 2014-05-28 ENCOUNTER — Emergency Department: Payer: Self-pay | Admitting: Emergency Medicine

## 2014-05-28 LAB — COMPREHENSIVE METABOLIC PANEL
ALK PHOS: 85 U/L
Albumin: 3.4 g/dL (ref 3.4–5.0)
Anion Gap: 6 — ABNORMAL LOW (ref 7–16)
BUN: 8 mg/dL (ref 7–18)
Bilirubin,Total: 1.1 mg/dL — ABNORMAL HIGH (ref 0.2–1.0)
CHLORIDE: 90 mmol/L — AB (ref 98–107)
CO2: 34 mmol/L — AB (ref 21–32)
CREATININE: 1 mg/dL (ref 0.60–1.30)
Calcium, Total: 8.3 mg/dL — ABNORMAL LOW (ref 8.5–10.1)
EGFR (African American): >60
Glucose: 87 mg/dL (ref 65–99)
OSMOLALITY: 258 (ref 275–301)
Potassium: 3.7 mmol/L (ref 3.5–5.1)
SGOT(AST): 25 U/L (ref 15–37)
SGPT (ALT): 20 U/L
Sodium: 130 mmol/L — ABNORMAL LOW (ref 136–145)
Total Protein: 7.1 g/dL (ref 6.4–8.2)

## 2014-05-28 LAB — CBC
HCT: 37.3 % — ABNORMAL LOW (ref 40.0–52.0)
HGB: 12 g/dL — ABNORMAL LOW (ref 13.0–18.0)
MCH: 31.2 pg (ref 26.0–34.0)
MCHC: 32.2 g/dL (ref 32.0–36.0)
MCV: 97 fL (ref 80–100)
PLATELETS: 141 10*3/uL — AB (ref 150–440)
RBC: 3.85 10*6/uL — ABNORMAL LOW (ref 4.40–5.90)
RDW: 14.7 % — ABNORMAL HIGH (ref 11.5–14.5)
WBC: 7.2 10*3/uL (ref 3.8–10.6)

## 2014-05-28 LAB — URINALYSIS, COMPLETE
BLOOD: NEGATIVE
Bilirubin,UR: NEGATIVE
Glucose,UR: NEGATIVE mg/dL (ref 0–75)
KETONE: NEGATIVE
Nitrite: NEGATIVE
Ph: 7 (ref 4.5–8.0)
Protein: NEGATIVE
RBC, UR: NONE SEEN /HPF (ref 0–5)
Specific Gravity: 1.003 (ref 1.003–1.030)
Squamous Epithelial: NONE SEEN
WBC UR: 6 /HPF (ref 0–5)

## 2014-05-28 LAB — CK TOTAL AND CKMB (NOT AT ARMC)
CK, TOTAL: 87 U/L (ref 39–308)
CK-MB: 3.1 ng/mL (ref 0.5–3.6)

## 2014-05-28 LAB — TROPONIN I: Troponin-I: <0.02 ng/mL

## 2014-08-04 ENCOUNTER — Emergency Department: Payer: Self-pay | Admitting: Student

## 2014-08-04 ENCOUNTER — Encounter (HOSPITAL_COMMUNITY): Payer: Self-pay | Admitting: *Deleted

## 2014-08-04 ENCOUNTER — Inpatient Hospital Stay (HOSPITAL_COMMUNITY)
Admission: AD | Admit: 2014-08-04 | Discharge: 2014-08-13 | DRG: 190 | Disposition: A | Discharge status: Home Health | Payer: Medicare Other | Source: Other Acute Inpatient Hospital | Attending: Internal Medicine | Admitting: Internal Medicine

## 2014-08-04 DIAGNOSIS — E871 Hypo-osmolality and hyponatremia: Secondary | ICD-10-CM | POA: Diagnosis present

## 2014-08-04 DIAGNOSIS — I429 Cardiomyopathy, unspecified: Secondary | ICD-10-CM | POA: Diagnosis present

## 2014-08-04 DIAGNOSIS — Z7982 Long term (current) use of aspirin: Secondary | ICD-10-CM | POA: Diagnosis not present

## 2014-08-04 DIAGNOSIS — M1611 Unilateral primary osteoarthritis, right hip: Secondary | ICD-10-CM | POA: Diagnosis present

## 2014-08-04 DIAGNOSIS — Y95 Nosocomial condition: Secondary | ICD-10-CM | POA: Diagnosis present

## 2014-08-04 DIAGNOSIS — F329 Major depressive disorder, single episode, unspecified: Secondary | ICD-10-CM | POA: Diagnosis present

## 2014-08-04 DIAGNOSIS — Z9889 Other specified postprocedural states: Secondary | ICD-10-CM

## 2014-08-04 DIAGNOSIS — M542 Cervicalgia: Secondary | ICD-10-CM | POA: Diagnosis present

## 2014-08-04 DIAGNOSIS — L409 Psoriasis, unspecified: Secondary | ICD-10-CM | POA: Diagnosis present

## 2014-08-04 DIAGNOSIS — E038 Other specified hypothyroidism: Secondary | ICD-10-CM | POA: Diagnosis present

## 2014-08-04 DIAGNOSIS — M25551 Pain in right hip: Secondary | ICD-10-CM | POA: Diagnosis present

## 2014-08-04 DIAGNOSIS — I10 Essential (primary) hypertension: Secondary | ICD-10-CM | POA: Diagnosis present

## 2014-08-04 DIAGNOSIS — M545 Low back pain, unspecified: Secondary | ICD-10-CM

## 2014-08-04 DIAGNOSIS — G8929 Other chronic pain: Secondary | ICD-10-CM | POA: Diagnosis present

## 2014-08-04 DIAGNOSIS — J441 Chronic obstructive pulmonary disease with (acute) exacerbation: Principal | ICD-10-CM | POA: Diagnosis present

## 2014-08-04 DIAGNOSIS — E039 Hypothyroidism, unspecified: Secondary | ICD-10-CM | POA: Diagnosis present

## 2014-08-04 DIAGNOSIS — R55 Syncope and collapse: Secondary | ICD-10-CM | POA: Diagnosis present

## 2014-08-04 DIAGNOSIS — J9621 Acute and chronic respiratory failure with hypoxia: Secondary | ICD-10-CM | POA: Diagnosis present

## 2014-08-04 DIAGNOSIS — R600 Localized edema: Secondary | ICD-10-CM

## 2014-08-04 DIAGNOSIS — E872 Acidosis: Secondary | ICD-10-CM | POA: Diagnosis present

## 2014-08-04 DIAGNOSIS — I5032 Chronic diastolic (congestive) heart failure: Secondary | ICD-10-CM | POA: Diagnosis present

## 2014-08-04 DIAGNOSIS — J9690 Respiratory failure, unspecified, unspecified whether with hypoxia or hypercapnia: Secondary | ICD-10-CM

## 2014-08-04 DIAGNOSIS — Z87891 Personal history of nicotine dependence: Secondary | ICD-10-CM

## 2014-08-04 DIAGNOSIS — G4733 Obstructive sleep apnea (adult) (pediatric): Secondary | ICD-10-CM | POA: Diagnosis present

## 2014-08-04 DIAGNOSIS — J9612 Chronic respiratory failure with hypercapnia: Secondary | ICD-10-CM | POA: Diagnosis present

## 2014-08-04 DIAGNOSIS — W19XXXA Unspecified fall, initial encounter: Secondary | ICD-10-CM

## 2014-08-04 DIAGNOSIS — J189 Pneumonia, unspecified organism: Secondary | ICD-10-CM | POA: Diagnosis present

## 2014-08-04 DIAGNOSIS — I959 Hypotension, unspecified: Secondary | ICD-10-CM | POA: Diagnosis not present

## 2014-08-04 DIAGNOSIS — Z981 Arthrodesis status: Secondary | ICD-10-CM | POA: Diagnosis not present

## 2014-08-04 DIAGNOSIS — J9622 Acute and chronic respiratory failure with hypercapnia: Secondary | ICD-10-CM | POA: Diagnosis present

## 2014-08-04 DIAGNOSIS — E875 Hyperkalemia: Secondary | ICD-10-CM | POA: Diagnosis present

## 2014-08-04 DIAGNOSIS — J9601 Acute respiratory failure with hypoxia: Secondary | ICD-10-CM

## 2014-08-04 DIAGNOSIS — F32A Depression, unspecified: Secondary | ICD-10-CM | POA: Diagnosis present

## 2014-08-04 DIAGNOSIS — M4322 Fusion of spine, cervical region: Secondary | ICD-10-CM | POA: Diagnosis present

## 2014-08-04 DIAGNOSIS — M1990 Unspecified osteoarthritis, unspecified site: Secondary | ICD-10-CM

## 2014-08-04 DIAGNOSIS — J9602 Acute respiratory failure with hypercapnia: Secondary | ICD-10-CM

## 2014-08-04 DIAGNOSIS — I420 Dilated cardiomyopathy: Secondary | ICD-10-CM | POA: Diagnosis present

## 2014-08-04 DIAGNOSIS — M549 Dorsalgia, unspecified: Secondary | ICD-10-CM

## 2014-08-04 DIAGNOSIS — M25511 Pain in right shoulder: Secondary | ICD-10-CM | POA: Diagnosis present

## 2014-08-04 DIAGNOSIS — S22009A Unspecified fracture of unspecified thoracic vertebra, initial encounter for closed fracture: Secondary | ICD-10-CM

## 2014-08-04 LAB — CBC WITH DIFFERENTIAL/PLATELET
BASOS PCT: 0 % (ref 0–1)
Basophils Absolute: 0 10*3/uL (ref 0.0–0.1)
Eosinophils Absolute: 0 10*3/uL (ref 0.0–0.7)
Eosinophils Relative: 0 % (ref 0–5)
HCT: 42.7 % (ref 39.0–52.0)
HEMOGLOBIN: 13.2 g/dL (ref 13.0–17.0)
LYMPHS PCT: 3 % — AB (ref 12–46)
Lymphs Abs: 0.2 10*3/uL — ABNORMAL LOW (ref 0.7–4.0)
MCH: 28.8 pg (ref 26.0–34.0)
MCHC: 30.9 g/dL (ref 30.0–36.0)
MCV: 93 fL (ref 78.0–100.0)
Monocytes Absolute: 0.1 10*3/uL (ref 0.1–1.0)
Monocytes Relative: 1 % — ABNORMAL LOW (ref 3–12)
NEUTROS ABS: 7.1 10*3/uL (ref 1.7–7.7)
NEUTROS PCT: 96 % — AB (ref 43–77)
Platelets: 180 10*3/uL (ref 150–400)
RBC: 4.59 MIL/uL (ref 4.22–5.81)
RDW: 15.4 % (ref 11.5–15.5)
WBC: 7.4 10*3/uL (ref 4.0–10.5)

## 2014-08-04 LAB — BLOOD GAS, ARTERIAL
Acid-Base Excess: 7.3 mmol/L — ABNORMAL HIGH (ref 0.0–2.0)
Bicarbonate: 34.3 mEq/L — ABNORMAL HIGH (ref 20.0–24.0)
Delivery systems: POSITIVE
Drawn by: 39866
EXPIRATORY PAP: 5
FIO2: 0.45 %
INSPIRATORY PAP: 16
O2 Saturation: 95.9 %
PH ART: 7.251 — AB (ref 7.350–7.450)
Patient temperature: 98.6
RATE: 18 resp/min
TCO2: 36.7 mmol/L (ref 0–100)
pCO2 arterial: 80.6 mmHg (ref 35.0–45.0)
pO2, Arterial: 88.9 mmHg (ref 80.0–100.0)

## 2014-08-04 LAB — COMPREHENSIVE METABOLIC PANEL
ALK PHOS: 79 U/L (ref 39–117)
ALT: 15 U/L (ref 0–53)
ANION GAP: 8 (ref 5–15)
AST: 18 U/L (ref 0–37)
Albumin: 4.2 g/dL (ref 3.5–5.2)
BILIRUBIN TOTAL: 1.5 mg/dL — AB (ref 0.3–1.2)
BUN: 16 mg/dL (ref 6–23)
CALCIUM: 8.6 mg/dL (ref 8.4–10.5)
CHLORIDE: 94 mmol/L — AB (ref 96–112)
CO2: 31 mmol/L (ref 19–32)
Creatinine, Ser: 0.98 mg/dL (ref 0.50–1.35)
GFR, EST NON AFRICAN AMERICAN: 84 mL/min — AB (ref >90)
GLUCOSE: 136 mg/dL — AB (ref 70–99)
POTASSIUM: 5.5 mmol/L — AB (ref 3.5–5.1)
SODIUM: 133 mmol/L — AB (ref 135–145)
Total Protein: 7.2 g/dL (ref 6.0–8.3)

## 2014-08-04 LAB — PROTIME-INR
INR: 1.04 (ref 0.00–1.49)
Prothrombin Time: 13.7 seconds (ref 11.6–15.2)

## 2014-08-04 LAB — TSH: TSH: 4.536 u[IU]/mL — ABNORMAL HIGH (ref 0.350–4.500)

## 2014-08-04 LAB — BRAIN NATRIURETIC PEPTIDE: B Natriuretic Peptide: 112.1 pg/mL — ABNORMAL HIGH (ref 0.0–100.0)

## 2014-08-04 LAB — TYPE AND SCREEN
ABO/RH(D): A NEG
Antibody Screen: NEGATIVE

## 2014-08-04 LAB — PHOSPHORUS: PHOSPHORUS: 4.6 mg/dL (ref 2.3–4.6)

## 2014-08-04 LAB — TROPONIN I

## 2014-08-04 LAB — APTT: aPTT: 44 seconds — ABNORMAL HIGH (ref 24–37)

## 2014-08-04 MED ORDER — SODIUM CHLORIDE 0.9 % IV SOLN
INTRAVENOUS | Status: DC
  Administered 2014-08-04: 23:00:00 via INTRAVENOUS

## 2014-08-04 MED ORDER — LEVOFLOXACIN IN D5W 750 MG/150ML IV SOLN
750.0000 mg | INTRAVENOUS | Status: DC
Start: 2014-08-04 — End: 2014-08-09
  Administered 2014-08-04 – 2014-08-08 (×5): 750 mg via INTRAVENOUS
  Filled 2014-08-04 (×6): qty 150

## 2014-08-04 MED ORDER — ONDANSETRON HCL 4 MG/2ML IJ SOLN: 4.0000 mg | Freq: Four times a day (QID) | INTRAMUSCULAR | Status: DC | PRN

## 2014-08-04 MED ORDER — SUCCINYLCHOLINE CHLORIDE 20 MG/ML IJ SOLN
INTRAMUSCULAR | Status: AC
  Filled 2014-08-04: qty 1

## 2014-08-04 MED ORDER — ONDANSETRON HCL 4 MG PO TABS: 4.0000 mg | ORAL_TABLET | Freq: Four times a day (QID) | ORAL | Status: DC | PRN

## 2014-08-04 MED ORDER — VANCOMYCIN HCL 10 G IV SOLR
2000.0000 mg | Freq: Once | INTRAVENOUS | Status: AC
  Administered 2014-08-04: 2000 mg via INTRAVENOUS
  Filled 2014-08-04: qty 2000

## 2014-08-04 MED ORDER — ROCURONIUM BROMIDE 50 MG/5ML IV SOLN
INTRAVENOUS | Status: AC
  Filled 2014-08-04: qty 2

## 2014-08-04 MED ORDER — ACETAMINOPHEN 650 MG RE SUPP: 650.0000 mg | Freq: Four times a day (QID) | RECTAL | Status: DC | PRN

## 2014-08-04 MED ORDER — ETOMIDATE 2 MG/ML IV SOLN
INTRAVENOUS | Status: AC
  Filled 2014-08-04: qty 20

## 2014-08-04 MED ORDER — VANCOMYCIN HCL IN DEXTROSE 1-5 GM/200ML-% IV SOLN
1000.0000 mg | Freq: Two times a day (BID) | INTRAVENOUS | Status: DC
  Administered 2014-08-05 – 2014-08-07 (×5): 1000 mg via INTRAVENOUS
  Filled 2014-08-04 (×6): qty 200

## 2014-08-04 MED ORDER — HYDRALAZINE HCL 20 MG/ML IJ SOLN
10.0000 mg | INTRAMUSCULAR | Status: DC | PRN
  Filled 2014-08-04: qty 1

## 2014-08-04 MED ORDER — CHLORHEXIDINE GLUCONATE 0.12 % MT SOLN
15.0000 mL | Freq: Two times a day (BID) | OROMUCOSAL | Status: DC
  Administered 2014-08-05 – 2014-08-13 (×15): 15 mL via OROMUCOSAL
  Filled 2014-08-04 (×18): qty 15

## 2014-08-04 MED ORDER — FENTANYL CITRATE 0.05 MG/ML IJ SOLN
INTRAMUSCULAR | Status: AC
  Filled 2014-08-04: qty 4

## 2014-08-04 MED ORDER — IPRATROPIUM-ALBUTEROL 0.5-2.5 (3) MG/3ML IN SOLN: 3.0000 mL | RESPIRATORY_TRACT | Status: DC

## 2014-08-04 MED ORDER — BUDESONIDE 0.25 MG/2ML IN SUSP
0.2500 mg | Freq: Two times a day (BID) | RESPIRATORY_TRACT | Status: DC
  Administered 2014-08-05 – 2014-08-10 (×9): 0.25 mg via RESPIRATORY_TRACT
  Filled 2014-08-04 (×15): qty 2

## 2014-08-04 MED ORDER — LEVOTHYROXINE SODIUM 50 MCG PO TABS
50.0000 ug | ORAL_TABLET | Freq: Every day | ORAL | Status: DC
  Administered 2014-08-05 – 2014-08-06 (×2): 50 ug via ORAL
  Filled 2014-08-04 (×3): qty 1

## 2014-08-04 MED ORDER — MIDAZOLAM HCL 2 MG/2ML IJ SOLN
INTRAMUSCULAR | Status: AC
  Filled 2014-08-04: qty 4

## 2014-08-04 MED ORDER — ASPIRIN EC 81 MG PO TBEC
81.0000 mg | DELAYED_RELEASE_TABLET | Freq: Every day | ORAL | Status: DC
  Administered 2014-08-05 – 2014-08-13 (×9): 81 mg via ORAL
  Filled 2014-08-04 (×10): qty 1

## 2014-08-04 MED ORDER — ACETAMINOPHEN 325 MG PO TABS: 650.0000 mg | ORAL_TABLET | Freq: Four times a day (QID) | ORAL | Status: DC | PRN

## 2014-08-04 MED ORDER — HEPARIN SODIUM (PORCINE) 5000 UNIT/ML IJ SOLN: 5000.0000 [IU] | Freq: Three times a day (TID) | INTRAMUSCULAR | Status: DC

## 2014-08-04 MED ORDER — BUDESONIDE-FORMOTEROL FUMARATE 160-4.5 MCG/ACT IN AERO
2.0000 | INHALATION_SPRAY | Freq: Two times a day (BID) | RESPIRATORY_TRACT | Status: DC
  Filled 2014-08-04: qty 6

## 2014-08-04 MED ORDER — SODIUM CHLORIDE 0.9 % IJ SOLN
3.0000 mL | Freq: Two times a day (BID) | INTRAMUSCULAR | Status: DC
  Administered 2014-08-04 – 2014-08-08 (×8): 3 mL via INTRAVENOUS
  Administered 2014-08-09: 10 mL via INTRAVENOUS
  Administered 2014-08-09 – 2014-08-12 (×7): 3 mL via INTRAVENOUS

## 2014-08-04 MED ORDER — IPRATROPIUM-ALBUTEROL 0.5-2.5 (3) MG/3ML IN SOLN
3.0000 mL | Freq: Four times a day (QID) | RESPIRATORY_TRACT | Status: DC
  Administered 2014-08-05 – 2014-08-07 (×10): 3 mL via RESPIRATORY_TRACT
  Filled 2014-08-04 (×10): qty 3

## 2014-08-04 MED ORDER — LIDOCAINE HCL (CARDIAC) 20 MG/ML IV SOLN
INTRAVENOUS | Status: AC
  Filled 2014-08-04: qty 5

## 2014-08-04 MED ORDER — METHYLPREDNISOLONE SODIUM SUCC 125 MG IJ SOLR
60.0000 mg | Freq: Four times a day (QID) | INTRAMUSCULAR | Status: DC
  Administered 2014-08-04 – 2014-08-07 (×11): 60 mg via INTRAVENOUS
  Filled 2014-08-04 (×3): qty 0.96
  Filled 2014-08-04: qty 2
  Filled 2014-08-04 (×2): qty 0.96
  Filled 2014-08-04: qty 2
  Filled 2014-08-04 (×7): qty 0.96

## 2014-08-04 MED ORDER — CETYLPYRIDINIUM CHLORIDE 0.05 % MT LIQD
7.0000 mL | Freq: Two times a day (BID) | OROMUCOSAL | Status: DC
  Administered 2014-08-05 – 2014-08-11 (×11): 7 mL via OROMUCOSAL

## 2014-08-04 NOTE — Consult Note (Signed)
ANTIBIOTIC CONSULT NOTE - INITIAL  Pharmacy Consult for Vancomycin and Levaquin Indication: COPD exacerbation  Allergies  Allergen Reactions  . Ampicillin Swelling  . Brimonidine   . Cyclobenzaprine   . Gabapentin Rash    Patient Measurements: Height: 5\' 10"  (177.8 cm) Weight: 242 lb 8.1 oz (110 kg) IBW/kg (Calculated) : 73  Vital Signs: Temp: 98.2 F (36.8 C) (02/14 2000) Temp Source: Oral (02/14 1833) BP: 205/102 mmHg (02/14 2118) Pulse Rate: 69 (02/14 2118) Intake/Output from previous day:   Intake/Output from this shift:    Labs:  Recent Labs  08/04/14 2100  WBC 7.4  HGB 13.2  PLT 180  CREATININE 0.98   Estimated Creatinine Clearance: 92.1 mL/min (by C-G formula based on Cr of 0.98).  Microbiology: No results found for this or any previous visit (from the past 720 hour(s)).  Medical History: Past Medical History  Diagnosis Date  . Antalgic gait   . Hypertension   . Lumbago   . COPD (chronic obstructive pulmonary disease)   . Complication of anesthesia     pt has had cervical fusion-limited neck flextion  . Depression   . DJD (degenerative joint disease)     Right shoulder  . Carotid artery occlusion    Assessment: 68yom with hx COPD presented to [place] with a fall and then started having respiratory distress. CT chest at [facility] was negative for PE, pneumonia. [facility]. He will begin vancomycin and levaquin for probable COPD exacerbation. Renal function wnl. Does not appear that he received any antibiotics at [facility].  Goal of Therapy:  Vancomycin trough level 15-20 mcg/ml  Plan:  1) Vancomycin 2g IV x 1 then 1g IV q12 2) Levaquin 750mg  IV q24 3) Follow renal function, cultures, LOT, level if needed  Fredrik RiggerMarkle, Pablo Stauffer Sue 08/04/2014,10:42 PM

## 2014-08-04 NOTE — Consult Note (Signed)
PULMONARY / CRITICAL CARE MEDICINE   Name: Chad Huffman MRN: [id number] DOB: [birth year]    ADMISSION DATE:  08/04/2014 CONSULTATION DATE:  2/14  REFERRING MD :  Allena Katz  CHIEF COMPLAINT:  AECOPD  INITIAL PRESENTATION: 69yo male with hx HTN, COPD, recent cervical spine surgery who presented to [facility] 2/14 with worsening SOB and BLE edema and ultimately had a fall at home.  Pt denies LOC but was "dizzy" prior to fall, ?r/t hypercarbia.  Was hypercarbic on ABG in ER, placed on bipap and tx to [facility]. Admitted by [facility] and was improving but had difficulty tolerating bipap and PCCM consulted.   STUDIES:  CTA chest ([place]) 2/14>>> neg PE, elevated R hemidiaphragm, otherwise neg acute  Thoracic spine 2/14>>> ? Fracture of lower most R pedicle screw of posterior fusion.   SIGNIFICANT EVENTS:   HISTORY OF PRESENT ILLNESS:  69yo male with hx HTN, COPD, recent cervical spine surgery who presented to [facility] 2/14 with worsening SOB and BLE edema and ultimately had a fall at home.  Pt denies LOC but was "dizzy" prior to fall, ?r/t hypercarbia.  Was hypercarbic on ABG in ER, placed on bipap and tx to [facility]. Admitted by [facility] and was improving but had difficulty tolerating bipap and PCCM consulted.  Pt is calm and overall cooperative, but states all of this information should be in his chart from [place] and does not want to repeat himself multiple times.  Does deny SOB currently, is feeling better overall.  Denies chest pain, lightheadedness, fevers, purulent sputum.  Does not wear O2 at home.   PAST MEDICAL HISTORY :   has a past medical history of Antalgic gait; Hypertension; Lumbago; COPD (chronic obstructive pulmonary disease); Complication of anesthesia; Depression; DJD (degenerative joint disease); and Carotid artery occlusion.  has past surgical history that includes Tonsillectomy; Carpal tunnel release; Abdominal aortic aneurysm repair (2009); Pilonidal cyst / sinus excision; Spine  surgery (09/15/11); Spine surgery (09/15/11); Neck surgery; Rotary cuff; Hammer toe surgery (12/10/2011); and Metatarsal osteotomy (12/10/2011). Prior to Admission medications   Medication Sig Start Date End Date Taking? Authorizing Provider  ALPRAZolam Prudy Feeler) 1 MG tablet 2 (two) times daily. 08/31/11   Historical Provider, MD  aspirin 81 MG tablet Take 81 mg by mouth daily.    Historical Provider, MD  baclofen (LIORESAL) 10 MG tablet  07/16/11   Historical Provider, MD  bisacodyl (DULCOLAX) 5 MG EC tablet Take 5 mg by mouth daily as needed.    Historical Provider, MD  brimonidine (ALPHAGAN) 0.15 % ophthalmic solution 1 drop 2 (two) times daily.    Historical Provider, MD  desonide (DESOWEN) 0.05 % cream Apply 0.05 % topically as needed. 09/07/11   Historical Provider, MD  furosemide (LASIX) 20 MG tablet  08/13/11   Historical Provider, MD  ketoconazole (NIZORAL) 2 % shampoo  07/24/13   Historical Provider, MD  KRILL OIL PO Take by mouth daily.    Historical Provider, MD  Lactobacillus (ACIDOPHILUS PO) Take by mouth daily.    Historical Provider, MD  levothyroxine (SYNTHROID, LEVOTHROID) 50 MCG tablet  08/08/11   Historical Provider, MD  losartan (COZAAR) 50 MG tablet  07/22/13   Historical Provider, MD  losartan-hydrochlorothiazide (HYZAAR) 50-12.5 MG per tablet  08/30/11   Historical Provider, MD  magnesium hydroxide (MILK OF MAGNESIA) 400 MG/5ML suspension Take by mouth daily as needed for mild constipation.    Historical Provider, MD  Multiple Vitamin (MULTIVITAMIN) tablet Take 1 tablet by mouth daily.  Historical Provider, MD  nortriptyline (PAMELOR) 25 MG capsule Take 25 mg by mouth 2 (two) times daily.    Historical Provider, MD  promethazine (PHENERGAN) 12.5 MG tablet Take 2 tablets (25 mg total) by mouth every 6 (six) hours as needed for nausea. 12/10/11 12/17/11  Max T Hyatt, DPM  sertraline (ZOLOFT) 50 MG tablet Take 50 mg by mouth daily.    Historical Provider, MD  [facility] 160-4.5 MCG/ACT  inhaler  08/16/11   Historical Provider, MD  vitamin B-12 (CYANOCOBALAMIN) 1000 MCG tablet Take 1,000 mcg by mouth every 30 (thirty) days.    Historical Provider, MD   Allergies  Allergen Reactions  . Ampicillin Swelling  . Brimonidine   . Cyclobenzaprine   . Gabapentin Rash    FAMILY HISTORY:  indicated that his mother is deceased. He indicated that his father is deceased.  SOCIAL HISTORY:  reports that he quit smoking about 7 years ago. He has never used smokeless tobacco. He reports that he does not drink alcohol or use illicit drugs.  REVIEW OF SYSTEMS:  As per HPI - All other systems reviewed and were neg.   SUBJECTIVE:   VITAL SIGNS: Temp:  [98.1 F (36.7 C)-98.2 F (36.8 C)] 98.2 F (36.8 C) (02/14 2000) Pulse Rate:  [60-93] 93 (02/15 0003) Resp:  [8-25] 18 (02/15 0003) BP: (119-205)/(50-138) 168/138 mmHg (02/15 0000) SpO2:  [94 %-100 %] 100 % (02/15 0212) FiO2 (%):  [40 %-45 %] 40 % (02/15 0212) Weight:  [110 kg (242 lb 8.1 oz)] 110 kg (242 lb 8.1 oz) (02/14 1833) HEMODYNAMICS:   VENTILATOR SETTINGS: Vent Mode:  [-]  FiO2 (%):  [40 %-45 %] 40 % INTAKE / OUTPUT:  Intake/Output Summary (Last 24 hours) at 08/05/14 0233 Last data filed at 08/04/14 2316  Gross per 24 hour  Intake    650 ml  Output      0 ml  Net    650 ml    PHYSICAL EXAMINATION: General:  wdwn male, NAD  Neuro:  Drowsy but easy to arouse, MAE, answers questions appropriately HEENT:  Mm dry, no JVD, soft c-collar  Cardiovascular:  s1s2 rrr Lungs:  resps even non labored on bipap, diminished bases, no audible wheeze  Abdomen:  Soft, +bs, non tender  Musculoskeletal:  Warm and dry, 2+ BLE edema   LABS:  CBC  Recent Labs Lab 08/04/14 2100  WBC 7.4  HGB 13.2  HCT 42.7  PLT 180   Coag's  Recent Labs Lab 08/04/14 2100  APTT 44*  INR 1.04   BMET  Recent Labs Lab 08/04/14 2100  NA 133*  K 5.5*  CL 94*  CO2 31  BUN 16  CREATININE 0.98  GLUCOSE 136*    Electrolytes  Recent Labs Lab 08/04/14 2100  CALCIUM 8.6  PHOS 4.6   Sepsis Markers No results for input(s): LATICACIDVEN, PROCALCITON, O2SATVEN in the last 168 hours. ABG  Recent Labs Lab 08/04/14 2055 08/05/14 0110  PHART 7.251* 7.283*  PCO2ART 80.6* 74.8*  PO2ART 88.9 81.2   Liver Enzymes  Recent Labs Lab 08/04/14 2100  AST 18  ALT 15  ALKPHOS 79  BILITOT 1.5*  ALBUMIN 4.2   Cardiac Enzymes  Recent Labs Lab 08/04/14 2100  TROPONINI <0.03   Glucose No results for input(s): GLUCAP in the last 168 hours.  Imaging No results found.   ASSESSMENT / PLAN:  PULMONARY Acute on chronic hypercarbic respiratory failure Respiratory acidosis AECOPD  P:   Cont attempts at bipap -  seems to be improving overall  F/u ABG in 2 hours  D/c NRB  Empiric abx as below Solumedrol  BD's - change duonebs to q6  D/c symbicort  Add budesonide F/u CXR    CARDIOVASCULAR Hx HTN  BLE edema  P:  Hold lasix for now  Resume in am if labs ok and BP tol  F/u EKG  F/u BNP   RENAL No active issue  P:   Chem pending   GASTROINTESTINAL No active issue  P:   NPO for now  HEMATOLOGIC No active issue  P:  SCD's   INFECTIOUS AECOPD  P:   Sputum 2/14>>> Vancomycin 2/14>>> Levaquin 2/14>>>  Check pct and narrow abx quickly as able  Does not appear overtly infected   ENDOCRINE Hypothyroid - tsh 4.536 P:   Cont synthroid  T3, t4 pending  Consider increase synthroid   NEUROLOGIC Recent cervical fusion  ?? Fracture of pedicle screw in fall P:   CT spine pending  Cont soft collar  nsgy to see  Consider tx to [facility] for his primary neurosurgeon if needs repair   FAMILY  - Updates: no family available 2/14  - Inter-disciplinary family meet or Palliative Care meeting due by:  2/21   Dirk DressKaty Whiteheart, NP 08/05/2014  2:33 AM Pager: [phone] or [phone]  Attending Note:  I have examined patient, reviewed labs, studies and notes. I  have discussed the case with Jasper RilingK Whiteheart, and I agree with the data and plans as amended above. Hx COPD presents in transfer after a fall at home. Unfortunately he probably fractured recent cervical surgical screw. He [facility], likely explaining his AMS and fall. He is interacting here, oriented. Tolerating BiPAP. Will continue NIPPV, treat for possible COPD flare. Plan to narrow abx quickly. Independent critical care time is 50 minutes.   Levy Pupaobert Byrum, MD, PhD 08/05/2014, 2:34 AM [facility] [phone] or if no answer [phone]

## 2014-08-04 NOTE — H&P (Signed)
[facility] History and Physical  Patient: Chad Huffman  MRN: [id number]  DOB: Apr 09, 1946  DOS: the patient was seen and examined on 08/04/2014 PCP: Juluis Pitch, MD  Chief Complaint: Fall  HPI: Chad Huffman is a 69 y.o. male with Past medical history of essential hypertension, carpal tunnel syndrome, psoriasis, AAA repair 2009, hypothyroidism, COPD, C2 to C7 fusion 2012, revision at [facility] by Dr. Lake Bells hsu 03/2014, L4-L5 decompression 2013. The patient was transferred from [facility]. The patient mentions to me that he does not want to repeat any new information to provide history as he has already informed everything to [facility], and was adamant. Patient though denied having any complaints of chest pain, shortness of breath, fever, abdominal pain, diarrhea, loss of control of bowel or bladder, focal weakness, tingling numbness in his hands or legs. His only complaint was tingling in his neck with minimal pain. He denies any dizziness or lightheadedness. Patient presented to another facility after a fall with complaint of low back pain, EMS also found that he was hypercarbic on Route. It is unclear whether he passed out if there is unclear whether he had head injury or neck injury, it is unclear whether he has taken more medication than his usual leg is unclear whether there is any any change in his medication, it is also unclear whether he had any cough or any respiratory or abdominal symptoms prior to the fall, as the patient is not willing to provide further information. Patient did complain of tingling all over his body. On review of the record she has chronic low back pain, chronic neck pain, chronic right shoulder and right hip pain secondary to arthritis, patient follows up with orthopedics Dr.Tuohy and spine surgeon Dr Rigoberto Noel at [place] and has recently been admitted at [facility] for multiple syncopal episode in which based on the documentation  available workup was unremarkable. Recent cervical spine x-ray December 15 report "No evidence of hardware failure. There is mild cervical thoracic kyphosis. No fracture evident. The prevertebral soft tissues appear normal." Patient was found to be significantly hypercarbic with acidosis with PCO2 in the 80s PH 7.2, chest x-ray was clear, CT chest PE protocol was negative for any pneumonia or any pulmonary embolism, EKG was unremarkable, patient was sent here for further workup. Patient received oxycodone and Solu-Medrol with nebulization and other facility.  The patient is coming from home. And at his baseline independent for most of his ADL.  Review of Systems: as mentioned in the history of present illness.  A Comprehensive review of the other systems is negative.  Past Medical History  Diagnosis Date  . Antalgic gait   . Hypertension   . Lumbago   . COPD (chronic obstructive pulmonary disease)   . Complication of anesthesia     pt has had cervical fusion-limited neck flextion  . Depression   . DJD (degenerative joint disease)     Right shoulder  . Carotid artery occlusion    Past Surgical History  Procedure Laterality Date  . Tonsillectomy    . Carpal tunnel release      bilateral CTS  . Abdominal aortic aneurysm repair  2009    Endovascular AAA repair  . Pilonidal cyst / sinus excision    . Spine surgery  09/15/11    Scar tissue removed- back  . Spine surgery  09/15/11    Scar tissue removed- back  . Neck surgery    . Rotary cuff    .  Hammer toe surgery  12/10/2011    Procedure: HAMMER TOE CORRECTION;  Surgeon: Tyson Dense, DPM;  Location: [place];  Service: Podiatry;  Laterality: Right;  Right hammertoe repair second right with 2.5x42 orthopro screw   . Metatarsal osteotomy  12/10/2011    Procedure: METATARSAL OSTEOTOMY;  Surgeon: Tyson Dense, DPM;  Location: [place];  Service: Podiatry;  Laterality: Right;  Right Keller arthroplasty with  size 5 silicone implant right great toe; second metatarsal osteotomy with 2.0x14 screw   Social History:  reports that he quit smoking about 7 years ago. He has never used smokeless tobacco. He reports that he does not drink alcohol or use illicit drugs.  Allergies  Allergen Reactions  . Ampicillin Swelling  . Brimonidine   . Cyclobenzaprine   . Gabapentin Rash    Family History  Problem Relation Age of Onset  . Cancer Mother   . Hypertension Brother   . Heart attack Father     Prior to Admission medications   Medication Sig Start Date End Date Taking? Authorizing Provider  ALPRAZolam Duanne Moron) 1 MG tablet 2 (two) times daily. 08/31/11   Historical Provider, MD  aspirin 81 MG tablet Take 81 mg by mouth daily.    Historical Provider, MD  baclofen (LIORESAL) 10 MG tablet  07/16/11   Historical Provider, MD  bisacodyl (DULCOLAX) 5 MG EC tablet Take 5 mg by mouth daily as needed.    Historical Provider, MD  brimonidine (ALPHAGAN) 0.15 % ophthalmic solution 1 drop 2 (two) times daily.    Historical Provider, MD  desonide (DESOWEN) 0.05 % cream Apply 0.05 % topically as needed. 09/07/11   Historical Provider, MD  furosemide (LASIX) 20 MG tablet  08/13/11   Historical Provider, MD  ketoconazole (NIZORAL) 2 % shampoo  07/24/13   Historical Provider, MD  KRILL OIL PO Take by mouth daily.    Historical Provider, MD  Lactobacillus (ACIDOPHILUS PO) Take by mouth daily.    Historical Provider, MD  levothyroxine (SYNTHROID, LEVOTHROID) 50 MCG tablet  08/08/11   Historical Provider, MD  losartan (COZAAR) 50 MG tablet  07/22/13   Historical Provider, MD  losartan-hydrochlorothiazide (HYZAAR) 50-12.5 MG per tablet  08/30/11   Historical Provider, MD  magnesium hydroxide (MILK OF MAGNESIA) 400 MG/5ML suspension Take by mouth daily as needed for mild constipation.    Historical Provider, MD  Multiple Vitamin (MULTIVITAMIN) tablet Take 1 tablet by mouth daily.    Historical Provider, MD  nortriptyline (PAMELOR)  25 MG capsule Take 25 mg by mouth 2 (two) times daily.    Historical Provider, MD  promethazine (PHENERGAN) 12.5 MG tablet Take 2 tablets (25 mg total) by mouth every 6 (six) hours as needed for nausea. 12/10/11 12/17/11  Max T Hyatt, DPM  sertraline (ZOLOFT) 50 MG tablet Take 50 mg by mouth daily.    Historical Provider, MD  [facility] 160-4.5 MCG/ACT inhaler  08/16/11   Historical Provider, MD  vitamin B-12 (CYANOCOBALAMIN) 1000 MCG tablet Take 1,000 mcg by mouth every 30 (thirty) days.    Historical Provider, MD    Physical Exam: Filed Vitals:   08/04/14 1945 08/04/14 2000 08/04/14 2015 08/04/14 2030  BP: 131/53 169/58 176/66   Pulse: 60 68 66 77  Temp:  98.2 F (36.8 C)    TempSrc:      Resp: _0 Height:      Weight:      SpO2: 95% 98% 97% 97%  General: Drowsy but arousable and Oriented to Time, Place and Person. Appear in mild distress Eyes: PERRL ENT: Oral Mucosa clear moist. Neck: Difficult to assess JVD Cardiovascular: S1 and S2 Present, no Murmur, Peripheral Pulses Present Respiratory: Bilateral Air entry equal and Decreased, Clear to Auscultation,  Abdomen: Bowel Sound present, Soft and no tender Skin: no Rash Extremities: Bilateral pedal edema, no calf tenderness Neurologic: Grossly no focal neuro deficit.  Labs on Admission:  CBC: No results for input(s): WBC, NEUTROABS, HGB, HCT, MCV, PLT in the last 168 hours.  CMP     Component Value Date/Time   NA 135 12/10/2011 1138   K 4.4 12/10/2011 1138   CL 92* 12/10/2011 1138   CO2 28 10/12/2007 0430   GLUCOSE 98 12/10/2011 1138   BUN 19 12/10/2011 1138   CREATININE 1.20 12/10/2011 1138   CALCIUM 8.2* 10/12/2007 0430   PROT 5.3* 10/12/2007 0430   ALBUMIN 3.1* 10/12/2007 0430   AST 16 10/12/2007 0430   ALT 17 10/12/2007 0430   ALKPHOS 45 10/12/2007 0430   BILITOT 0.9 10/12/2007 0430   GFRNONAA >60 10/12/2007 0430   GFRAA  10/12/2007 0430    >60        The eGFR has been calculated using the MDRD  equation. This calculation has not been validated in all clinical    No results for input(s): LIPASE, AMYLASE in the last 168 hours.  No results for input(s): CKTOTAL, CKMB, CKMBINDEX, TROPONINI in the last 168 hours. BNP (last 3 results) No results for input(s): BNP in the last 8760 hours.  ProBNP (last 3 results) No results for input(s): PROBNP in the last 8760 hours.   Radiological Exams on Admission: No results found.  EKG: Independently reviewed. normal sinus rhythm, nonspecific ST and T waves changes.  Assessment/Plan Principal Problem:   Acute respiratory failure with hypoxia and hypercarbia Active Problems:   S/P cervical spinal fusion   S/P lumbar laminectomy   Right shoulder pain   Arthritis of right hip   Essential hypertension   Hypothyroidism   Syncope   Fall   Low back pain   Pedal edema   1. Acute respiratory failure with hypoxia and hypercarbia The patient is presenting with acute hypoxic and hypercarbic respiratory failure. This happened after a fall. It is unclear whether patient had any respiratory symptoms prior to the fall. It is also unclear whether the patient has been taking more than his usual medications. CTP protocol as well as chest x-ray were unremarkable. With this I will repeat his ABG. Obtain sputum culture urine antigens influenza PCR 25 because of infection. We'll continue with DuoNeb's. We'll also continue with BiPAP at present. Further treatment depending on findings of the workup.  2.Extensive spinal surgery history at [facility] Possible cervical spinal hardware fracture. Patient complains of mild neck pain. He does not have any radicular symptoms at present. We will obtain CT of the cervical spine as well as CT of the head to rule out any acute abnormality as it has not been imaged. Discussed with neurosurgery patient will be placed in cervical collar. Neurosurgeon will be following up with the  patient. Patient's primary neurosurgeon's in [facility].  3. Bilateral lower extremity edema. Reportedly patient has chronic lower leg edema. Patient is on Lasix at home. EKG is unremarkable will continue with cardiac workup monitor on telemetry. May require diuresis.  4. Hypothyroidism, syncope, fall,. Obtaining TSH, H-60, folic acid urinalysis. Continue with Synthroid.  Advance goals of  care discussion: full code presumed   Consults: neurosurgery phone consultation  DVT Prophylaxis: mechanical compression Nutrition: Nothing by mouth except medication  Disposition: Admitted to inpatient in step-down unit.  Author: Berle Mull, MD [facility] Pager: [phone] 08/04/2014, 9:04 PM    If 7PM-7AM, please contact night-coverage [email or web address] Password [email or web address]

## 2014-08-04 NOTE — Progress Notes (Signed)
Orthopedic Tech Progress Note Patient Details:  Chad Huffman [birth year] [id number] Applied soft cervical collar. Ortho Devices Type of Ortho Device: Soft collar Ortho Device/Splint Interventions: Application   Lesle ChrisGilliland, Trevan Messman L 08/04/2014, 9:07 PM

## 2014-08-04 NOTE — Progress Notes (Signed)
Admissions notified of patient's arrival from [place].  MD to be notified. [place], Chad HansenJessica Huffman

## 2014-08-05 ENCOUNTER — Inpatient Hospital Stay (HOSPITAL_COMMUNITY): Payer: Medicare Other

## 2014-08-05 DIAGNOSIS — Z981 Arthrodesis status: Secondary | ICD-10-CM

## 2014-08-05 LAB — COMPREHENSIVE METABOLIC PANEL
ALT: 13 U/L (ref 0–53)
AST: 19 U/L (ref 0–37)
Albumin: 3.4 g/dL — ABNORMAL LOW (ref 3.5–5.2)
Alkaline Phosphatase: 69 U/L (ref 39–117)
Anion gap: 5 (ref 5–15)
BUN: 18 mg/dL (ref 6–23)
CO2: 34 mmol/L — AB (ref 19–32)
Calcium: 8.7 mg/dL (ref 8.4–10.5)
Chloride: 93 mmol/L — ABNORMAL LOW (ref 96–112)
Creatinine, Ser: 0.96 mg/dL (ref 0.50–1.35)
GFR calc Af Amer: >90 mL/min (ref >90)
GFR calc non Af Amer: 84 mL/min — ABNORMAL LOW (ref >90)
Glucose, Bld: 148 mg/dL — ABNORMAL HIGH (ref 70–99)
Potassium: 5 mmol/L (ref 3.5–5.1)
Sodium: 132 mmol/L — ABNORMAL LOW (ref 135–145)
Total Bilirubin: 1.6 mg/dL — ABNORMAL HIGH (ref 0.3–1.2)
Total Protein: 6.2 g/dL (ref 6.0–8.3)

## 2014-08-05 LAB — BLOOD GAS, ARTERIAL
ACID-BASE EXCESS: 7.7 mmol/L — AB (ref 0.0–2.0)
BICARBONATE: 34.2 meq/L — AB (ref 20.0–24.0)
DELIVERY SYSTEMS: POSITIVE
Drawn by: 39866
EXPIRATORY PAP: 6
FIO2: 0.4 %
INSPIRATORY PAP: 14
O2 Saturation: 94.7 %
PCO2 ART: 74.8 mmHg — AB (ref 35.0–45.0)
Patient temperature: 98.6
Pressure support: 8 cmH2O
RATE: 18 resp/min
TCO2: 36.5 mmol/L (ref 0–100)
pH, Arterial: 7.283 — ABNORMAL LOW (ref 7.350–7.450)
pO2, Arterial: 81.2 mmHg (ref 80.0–100.0)

## 2014-08-05 LAB — CBC WITH DIFFERENTIAL/PLATELET
Basophils Absolute: 0 10*3/uL (ref 0.0–0.1)
Basophils Relative: 0 % (ref 0–1)
EOS PCT: 0 % (ref 0–5)
Eosinophils Absolute: 0 10*3/uL (ref 0.0–0.7)
HEMATOCRIT: 37.2 % — AB (ref 39.0–52.0)
Hemoglobin: 11.7 g/dL — ABNORMAL LOW (ref 13.0–17.0)
Lymphocytes Relative: 7 % — ABNORMAL LOW (ref 12–46)
Lymphs Abs: 0.3 10*3/uL — ABNORMAL LOW (ref 0.7–4.0)
MCH: 28 pg (ref 26.0–34.0)
MCHC: 31.5 g/dL (ref 30.0–36.0)
MCV: 89 fL (ref 78.0–100.0)
Monocytes Absolute: 0.2 10*3/uL (ref 0.1–1.0)
Monocytes Relative: 4 % (ref 3–12)
NEUTROS PCT: 89 % — AB (ref 43–77)
Neutro Abs: 3.5 10*3/uL (ref 1.7–7.7)
PLATELETS: 190 10*3/uL (ref 150–400)
RBC: 4.18 MIL/uL — AB (ref 4.22–5.81)
RDW: 15.4 % (ref 11.5–15.5)
WBC: 4 10*3/uL (ref 4.0–10.5)

## 2014-08-05 LAB — URINALYSIS, ROUTINE W REFLEX MICROSCOPIC
BILIRUBIN URINE: NEGATIVE
Glucose, UA: NEGATIVE mg/dL
HGB URINE DIPSTICK: NEGATIVE
Ketones, ur: NEGATIVE mg/dL
LEUKOCYTES UA: NEGATIVE
Nitrite: NEGATIVE
PH: 6 (ref 5.0–8.0)
PROTEIN: NEGATIVE mg/dL
Specific Gravity, Urine: 1.025 (ref 1.005–1.030)
Urobilinogen, UA: 0.2 mg/dL (ref 0.0–1.0)

## 2014-08-05 LAB — T4, FREE: Free T4: 0.81 ng/dL (ref 0.80–1.80)

## 2014-08-05 LAB — TROPONIN I: Troponin I: <0.03 ng/mL (ref <0.031)

## 2014-08-05 LAB — STREP PNEUMONIAE URINARY ANTIGEN: Strep Pneumo Urinary Antigen: NEGATIVE

## 2014-08-05 LAB — FOLATE: Folate: >20 ng/mL

## 2014-08-05 LAB — VITAMIN B12: Vitamin B-12: 954 pg/mL — ABNORMAL HIGH (ref 211–911)

## 2014-08-05 MED ORDER — ENOXAPARIN SODIUM 40 MG/0.4ML ~~LOC~~ SOLN
40.0000 mg | SUBCUTANEOUS | Status: DC
Start: 2014-08-05 — End: 2014-08-13
  Administered 2014-08-05 – 2014-08-12 (×8): 40 mg via SUBCUTANEOUS
  Filled 2014-08-05 (×9): qty 0.4

## 2014-08-05 MED ORDER — BACLOFEN 10 MG PO TABS
10.0000 mg | ORAL_TABLET | Freq: Three times a day (TID) | ORAL | Status: DC
  Administered 2014-08-05 – 2014-08-13 (×23): 10 mg via ORAL
  Filled 2014-08-05 (×26): qty 1

## 2014-08-05 MED ORDER — SERTRALINE HCL 50 MG PO TABS
50.0000 mg | ORAL_TABLET | Freq: Every day | ORAL | Status: DC
  Administered 2014-08-06 – 2014-08-08 (×3): 50 mg via ORAL
  Filled 2014-08-05 (×4): qty 1

## 2014-08-05 MED ORDER — NORTRIPTYLINE HCL 25 MG PO CAPS
25.0000 mg | ORAL_CAPSULE | Freq: Two times a day (BID) | ORAL | Status: DC
  Administered 2014-08-05 – 2014-08-08 (×6): 25 mg via ORAL
  Filled 2014-08-05 (×7): qty 1

## 2014-08-05 MED ORDER — LOSARTAN POTASSIUM 50 MG PO TABS
50.0000 mg | ORAL_TABLET | Freq: Every day | ORAL | Status: DC
  Administered 2014-08-05 – 2014-08-07 (×3): 50 mg via ORAL
  Filled 2014-08-05 (×3): qty 1

## 2014-08-05 MED ORDER — ALPRAZOLAM 0.5 MG PO TABS
1.0000 mg | ORAL_TABLET | Freq: Two times a day (BID) | ORAL | Status: DC
  Administered 2014-08-05 – 2014-08-13 (×17): 1 mg via ORAL
  Filled 2014-08-05 (×17): qty 2

## 2014-08-05 MED ORDER — ADULT MULTIVITAMIN W/MINERALS CH
1.0000 | ORAL_TABLET | Freq: Every day | ORAL | Status: DC
  Administered 2014-08-06 – 2014-08-13 (×8): 1 via ORAL
  Filled 2014-08-05 (×8): qty 1

## 2014-08-05 MED ORDER — OXYCODONE HCL 5 MG PO TABS: 5.0000 mg | ORAL_TABLET | Freq: Once | ORAL | Status: DC

## 2014-08-05 NOTE — Care Management Note (Addendum)
    Page 1 of 2   08/12/2014     5:33:22 PM CARE MANAGEMENT NOTE 08/12/2014  Patient:  Chad Huffman,Chad Huffman   Account Number:  [id number]  Date Initiated:  08/05/2014  Documentation initiated by:  Junius CreamerWELL,DEBBIE  Subjective/Objective Assessment:   adm w resp failure     Action/Plan:   lives alone, pcp dr Onalee Huadavid bronstein   Anticipated DC Date:  08/12/2014   Anticipated DC Plan:  HOME W HOME HEALTH SERVICES      DC Planning Services  CM consult      [facility]  [facility]   Choice offered to / List presented to:  C-1 Patient   DME arranged  OXYGEN      DME agency  [facility]     [facility] arranged  HH-1 RN  HH-2 PT  HH-3 OT  HH-4 NURSE'S AIDE  HH-7 RESPIRATORY THERAPY      HH agency  [facility]   Status of service:  Completed, signed off Medicare Important Message given?  YES (If response is "NO", the following Medicare IM given date fields will be blank) Date Medicare IM given:  08/08/2014 Medicare IM given by:  Junius CreamerWELL,DEBBIE Date Additional Medicare IM given:  08/12/2014 Additional Medicare IM given by:  Letha CapeEBORAH Adasyn Mcadams  Discharge [facility]position:  [facility]  Per UR Regulation:  Reviewed for med. necessity/level of care/duration of stay  If discussed at Long Length of Stay Meetings, dates discussed:    Comments:  08/12/14 1731 Letha Capeeborah Tyreece Gelles RN, BSN [phone] patient for dc today, [facility], [facility], PT, OT, aide , Resp [facility], [facility]. Soc will begin 24-48 hrs post dc.  [facility], Jermaine notified and brought oxygen up to room and [facility] will [facility] up at home.  2/19 1207 debbie dowell rn,bsn spoke w pt. phy ther has rec hhpt. pt uses [facility] for hhc and would like them again. ref to mary w [facility] for hhrn adn hhpt. will cont to follow.

## 2014-08-05 NOTE — Progress Notes (Signed)
[place] TEAM 1 - Stepdown/ICU TEAM Progress Note  Chad LabradorDanny L Huffman ZOX:[id number] DOB: 01-Feb-1946 DOA: 08/04/2014 PCP: Dorothey BasemanBRONSTEIN, [name], MD  Admit HPI / Brief Narrative7: 68 y.o. male with history of hypertension, carpal tunnel syndrome, psoriasis, AAA repair 2009, hypothyroidism, COPD, C2 to C7 fusion 2012, [facility], and L4-L5 decompression 2013 who was [facility].  His only complaint was tingling in his neck with minimal pain.  Patient presented to the other facility after a fall with complaint of low back pain.  EMS also found that he was hypercarbic.  It is unclear whether he passed out or had head injury or neck injury.  Patient was found to be significantly hypercarbic with acidosis with PCO2 in the 80s pH 7.2, chest x-ray was clear, CT chest PE protocol was negative for pneumonia or pulmonary embolism, EKG was unremarkable.  The patient was sent here for further workup, but it is not clear why transfer [facility] was necessary.  On review of the record he has chronic low back pain, chronic neck pain, chronic right shoulder and right hip pain.  The patient follows with orthopedics Dr.Tuohy and spine surgeon [facility]nal[facility]Hsu [facility] and has [facility] for multiple syncopal episodes w/ workup being unremarkable.  HPI/Subjective: The pt is quite angry, accusing multiple different individuals of "lying to him" and "making things up."  He states he [facility], but that he was told that was not possible and he "[facility]  His hx is rambling and tough to focus.  He currently reports a tingling burning type pain in both of his feet, up his R leg to the hip, and only midway up his L leg.  He additionally c/o a similar pain in his R shoulder and arm.  He is able to move both feet and both arms, w/ equal strength th/o.  He denies urinary or bowel incontinence or retention.    Assessment/Plan:  Chronic back and neck pain - S/P cervical spinal fusion w/ redo Oct 2015 - S/P lumbar laminectomy Thoracic [facility] suggested ?fx of lower most R pedicle screw of posterior fusion, but CT [facility] indicates intact cervical fusion hardware w/ no evidence of acute traumatic injury - the pt feels more contrastable wearing a soft cervical collar - he feels he should be transferred to [facility] to be seen by his NS, but I find no evidence at this time of an acute neurologic event/emergency, and his resp status has been tenuous (requiring BIPAP continuously) - NS is to see him here - I will contact his MD at [facility] once NS has seen him if it appears he will need an intervention - he tells me he is scheduled to see his NS in his clinic next Monday   Acute hypoxic and hypercarbic resp failure due to COPD w/ acute exacerbation  Requiring continuous BIPAP - PCCM following w/ us   ?Syncope  CTa of chest negative for PE at [facility] - suspect this was related to hypercarbia due to COPD   B LE edema  Reportedly a chronic issue - mild/modest at present   HTN Adjust medical tx and follow  Hypothyroid TSH above goal at 4.536 - f/u FT4   Code Status: FULL Family Communication: no family present at time of exam Disposition Plan: SDU until fully liberated from BIPAP  Consultants: PCCM NS  Procedures: none  Antibiotics: Vancomycin 2/14 >  Levaquin 2/14 >  DVT prophylaxis: lovenox   Objective: Blood pressure 158/70, pulse 80, temperature 98.4 F (36.9 C), temperature source Oral, resp. rate 15, height  (1.778 m), weight 110 kg (242 lb 8.1 oz), SpO2 96 %.  Intake/Output Summary (Last 24 hours) at 08/05/14 1001 Last data filed at 08/05/14 0400  Gross per 24 hour  Intake 699.67 ml  Output    350 ml  Net 349.67 ml   Exam: General: taken off BIPAP to take meds - modest distress but able to complete full sentences  Lungs: Clear to auscultation bilaterally  without wheezes or focal crackles  Cardiovascular: Regular rate and rhythm without murmur gallop or rub normal S1 and S2 Abdomen: Nontender, nondistended, soft, bowel sounds positive, no rebound, no ascites, no appreciable mass Extremities: No significant cyanosis, clubbing, or edema bilateral lower extremities Neuro:  5/5 strength B U & LE - intact sensation touch th/o   Data Reviewed: Basic Metabolic Panel:  Recent Labs Lab 08/04/14 2100  NA 133*  K 5.5*  CL 94*  CO2 31  GLUCOSE 136*  BUN 16  CREATININE 0.98  CALCIUM 8.6  PHOS 4.6   Liver Function Tests:  Recent Labs Lab 08/04/14 2100  AST 18  ALT 15  ALKPHOS 79  BILITOT 1.5*  PROT 7.2  ALBUMIN 4.2   Coags:  Recent Labs Lab 08/04/14 2100  INR 1.04    Recent Labs Lab 08/04/14 2100  APTT 44*    CBC:  Recent Labs Lab 08/04/14 2100 08/05/14 0915  WBC 7.4 4.0  NEUTROABS 7.1 3.5  HGB 13.2 11.7*  HCT 42.7 37.2*  MCV 93.0 89.0  PLT 180 190   Cardiac Enzymes:  Recent Labs Lab 08/04/14 2100 08/05/14 0224  TROPONINI <0.03 <0.03   BNP (last 3 results)  Recent Labs  08/04/14 2100  BNP 112.1*   Studies:  Recent x-ray studies have been reviewed in detail by the Attending Physician  Scheduled Meds:  Scheduled Meds: . antiseptic oral rinse  7 mL Mouth Rinse q12n4p  . aspirin EC  81 mg Oral Daily  . budesonide  0.25 mg Nebulization BID  . chlorhexidine  15 mL Mouth Rinse BID  . ipratropium-albuterol  3 mL Nebulization Q6H  . levofloxacin (LEVAQUIN) IV  750 mg Intravenous Q24H  . levothyroxine  50 mcg Oral QAC breakfast  . methylPREDNISolone (SOLU-MEDROL) injection  60 mg Intravenous Q6H  . sodium chloride  3 mL Intravenous Q12H  . vancomycin  1,000 mg Intravenous Q12H    Time spent on care of this patient: 35 mins   Qiara Minetti T , MD   [facility] Office  [phone] Pager - Text Page per Loretha Stapler as per below:  On-Call/Text Page:      [email or web address]      password  [email or web address]  If 7PM-7AM, please contact night-coverage [email or web address] Password [email or web address] 08/05/2014, 10:01 AM   LOS: 1 day

## 2014-08-05 NOTE — Progress Notes (Signed)
Pt verbalized that he has been coughing for 2 weeks prior to admission. And also stated that he shrunk from 5'10" to 5'8.5".

## 2014-08-05 NOTE — Progress Notes (Signed)
PULMONARY / CRITICAL CARE MEDICINE   Name: Chad LabradorDanny L Huffman MRN: [id number] DOB: 11-09-1945    ADMISSION DATE:  08/04/2014 CONSULTATION DATE:  2/14  REFERRING MD :  Allena KatzPatel  CHIEF COMPLAINT:  AECOPD  INITIAL PRESENTATION: 69yo male with hx HTN, COPD, recent cervical spine surgery who presented to [facility] 2/14 with worsening SOB and BLE edema and ultimately had a fall at home.  Pt denies LOC but was "dizzy" prior to fall, ?r/t hypercarbia.  Was hypercarbic on ABG in ER, placed on bipap and tx to [facility]. Admitted by [facility] and was improving but had difficulty tolerating bipap and PCCM consulted.   STUDIES:  CTA chest ([place]) 2/14>>> neg PE, elevated R hemidiaphragm, otherwise neg acute  Thoracic spine 2/14>>> ? Fracture of lower most R pedicle screw of posterior fusion.   SIGNIFICANT EVENTS:  SUBJECTIVE:   VITAL SIGNS: Temp:  [98.1 F (36.7 C)-98.4 F (36.9 C)] 98.4 F (36.9 C) (02/15 0800) Pulse Rate:  [60-93] 80 (02/15 0900) Resp:  [8-25] 15 (02/15 0900) BP: (119-205)/(50-138) 158/70 mmHg (02/15 0900) SpO2:  [90 %-100 %] 96 % (02/15 0911) FiO2 (%):  [40 %-45 %] 40 % (02/15 0911) Weight:  [110 kg (242 lb 8.1 oz)] 110 kg (242 lb 8.1 oz) (02/14 1833) HEMODYNAMICS:   VENTILATOR SETTINGS: Vent Mode:  [-] BIPAP FiO2 (%):  [40 %-45 %] 40 % Set Rate:  [18 bmp] 18 bmp PEEP:  [6 cmH20] 6 cmH20 INTAKE / OUTPUT:  Intake/Output Summary (Last 24 hours) at 08/05/14 1028 Last data filed at 08/05/14 0400  Gross per 24 hour  Intake 699.67 ml  Output    350 ml  Net 349.67 ml    PHYSICAL EXAMINATION: General:  wdwn male, NAD  Neuro:  Drowsy but easy to arouse, MAE, answers questions appropriately HEENT:  Mm dry, no JVD, soft c-collar  Cardiovascular:  s1s2 rrr Lungs:  resps even non labored on bipap, diminished bases, no audible wheeze  Abdomen:  Soft, +bs, non tender  Musculoskeletal:  Warm and dry, 2+ BLE edema   LABS:  CBC  Recent Labs Lab 08/04/14 2100  08/05/14 0915  WBC 7.4 4.0  HGB 13.2 11.7*  HCT 42.7 37.2*  PLT 180 190   Coag's  Recent Labs Lab 08/04/14 2100  APTT 44*  INR 1.04   BMET  Recent Labs Lab 08/04/14 2100  NA 133*  K 5.5*  CL 94*  CO2 31  BUN 16  CREATININE 0.98  GLUCOSE 136*   Electrolytes  Recent Labs Lab 08/04/14 2100  CALCIUM 8.6  PHOS 4.6   Sepsis Markers No results for input(s): LATICACIDVEN, PROCALCITON, O2SATVEN in the last 168 hours. ABG  Recent Labs Lab 08/04/14 2055 08/05/14 0110  PHART 7.251* 7.283*  PCO2ART 80.6* 74.8*  PO2ART 88.9 81.2   Liver Enzymes  Recent Labs Lab 08/04/14 2100  AST 18  ALT 15  ALKPHOS 79  BILITOT 1.5*  ALBUMIN 4.2   Cardiac Enzymes  Recent Labs Lab 08/04/14 2100 08/05/14 0224  TROPONINI <0.03 <0.03   Glucose No results for input(s): GLUCAP in the last 168 hours.  Imaging No results found.   ASSESSMENT / PLAN:  PULMONARY Acute on chronic hypercarbic respiratory failure Respiratory acidosis AECOPD  P:   Change BiPAP to PRN  D/c NRB sats target of 88-92% Empiric abx as below Solumedrol  BD's - change duonebs to q6  D/c symbicort  Budesonide F/u CXR   CARDIOVASCULAR Hx HTN  BLE edema  P:  Hold  lasix for now  Lasix 40 mg IV q8 hrs x2 doses. F/u EKG  F/u BNP   RENAL Hyperkalemia  P:   BMET daily Lasix 40 mg IV q8 hrs x2 doses. Replace electrolytes as indicated.  GASTROINTESTINAL No active issue  P:   NPO while on BiPAP  HEMATOLOGIC No active issue  P:  SCD's   INFECTIOUS AECOPD  P:   Sputum 2/14>>> Vancomycin 2/14>>> Levaquin 2/14>>>  Check pct and narrow abx quickly as able  Does not appear overtly infected   ENDOCRINE Hypothyroid - tsh 4.536 P:   Cont synthroid  T3, t4 pending  Consider increase synthroid   NEUROLOGIC Recent cervical fusion  ?? Fracture of pedicle screw in fall P:   CT spine noted  Cont soft collar  NSG to see, will defer to primary  Consider tx to [facility] for his  primary neurosurgeon if needs repair   FAMILY  - Updates: no family available 2/15  - Inter-disciplinary family meet or Palliative Care meeting due by:  2/21  The patient is critically ill with multiple organ systems failure and requires high complexity decision making for assessment and support, frequent evaluation and titration of therapies, application of advanced monitoring technologies and extensive interpretation of multiple databases.   Critical Care Time devoted to patient care services described in this note is  35  Minutes. This time reflects time of care of this signee Dr Koren Bound. This critical care time does not reflect procedure time, or teaching time or supervisory time of PA/NP/Med student/Med Resident etc but could involve care discussion time.  Alyson Reedy, M.D. [facility] Pulmonary/Critical Care Medicine. Pager: [phone]. After hours pager: [phone].

## 2014-08-06 ENCOUNTER — Inpatient Hospital Stay (HOSPITAL_COMMUNITY): Payer: Medicare Other

## 2014-08-06 DIAGNOSIS — E039 Hypothyroidism, unspecified: Secondary | ICD-10-CM

## 2014-08-06 DIAGNOSIS — J9621 Acute and chronic respiratory failure with hypoxia: Secondary | ICD-10-CM

## 2014-08-06 DIAGNOSIS — R55 Syncope and collapse: Secondary | ICD-10-CM | POA: Diagnosis present

## 2014-08-06 DIAGNOSIS — R0902 Hypoxemia: Secondary | ICD-10-CM

## 2014-08-06 DIAGNOSIS — G8929 Other chronic pain: Secondary | ICD-10-CM

## 2014-08-06 DIAGNOSIS — E871 Hypo-osmolality and hyponatremia: Secondary | ICD-10-CM

## 2014-08-06 DIAGNOSIS — J441 Chronic obstructive pulmonary disease with (acute) exacerbation: Secondary | ICD-10-CM | POA: Diagnosis present

## 2014-08-06 DIAGNOSIS — I420 Dilated cardiomyopathy: Secondary | ICD-10-CM | POA: Diagnosis present

## 2014-08-06 DIAGNOSIS — J9622 Acute and chronic respiratory failure with hypercapnia: Secondary | ICD-10-CM | POA: Diagnosis present

## 2014-08-06 DIAGNOSIS — E038 Other specified hypothyroidism: Secondary | ICD-10-CM

## 2014-08-06 DIAGNOSIS — M549 Dorsalgia, unspecified: Secondary | ICD-10-CM

## 2014-08-06 DIAGNOSIS — M542 Cervicalgia: Secondary | ICD-10-CM | POA: Diagnosis present

## 2014-08-06 DIAGNOSIS — J189 Pneumonia, unspecified organism: Secondary | ICD-10-CM

## 2014-08-06 LAB — BASIC METABOLIC PANEL
ANION GAP: 3 — AB (ref 5–15)
BUN: 21 mg/dL (ref 6–23)
CALCIUM: 8.4 mg/dL (ref 8.4–10.5)
CO2: 34 mmol/L — AB (ref 19–32)
CREATININE: 1.01 mg/dL (ref 0.50–1.35)
Chloride: 94 mmol/L — ABNORMAL LOW (ref 96–112)
GFR calc Af Amer: 87 mL/min — ABNORMAL LOW (ref >90)
GFR calc non Af Amer: 75 mL/min — ABNORMAL LOW (ref >90)
Glucose, Bld: 140 mg/dL — ABNORMAL HIGH (ref 70–99)
Potassium: 5 mmol/L (ref 3.5–5.1)
Sodium: 131 mmol/L — ABNORMAL LOW (ref 135–145)

## 2014-08-06 LAB — CBC
HCT: 35 % — ABNORMAL LOW (ref 39.0–52.0)
Hemoglobin: 11.1 g/dL — ABNORMAL LOW (ref 13.0–17.0)
MCH: 28.2 pg (ref 26.0–34.0)
MCHC: 31.7 g/dL (ref 30.0–36.0)
MCV: 89.1 fL (ref 78.0–100.0)
PLATELETS: 169 10*3/uL (ref 150–400)
RBC: 3.93 MIL/uL — ABNORMAL LOW (ref 4.22–5.81)
RDW: 15.4 % (ref 11.5–15.5)
WBC: 6.2 10*3/uL (ref 4.0–10.5)

## 2014-08-06 LAB — PHOSPHORUS: Phosphorus: 3.8 mg/dL (ref 2.3–4.6)

## 2014-08-06 LAB — MAGNESIUM: Magnesium: 2.5 mg/dL (ref 1.5–2.5)

## 2014-08-06 LAB — LEGIONELLA ANTIGEN, URINE

## 2014-08-06 LAB — HEMOGLOBIN A1C
HEMOGLOBIN A1C: 6.1 % — AB (ref 4.8–5.6)
MEAN PLASMA GLUCOSE: 128 mg/dL

## 2014-08-06 MED ORDER — LEVOTHYROXINE SODIUM 125 MCG PO TABS
125.0000 ug | ORAL_TABLET | Freq: Every day | ORAL | Status: DC
  Administered 2014-08-07 – 2014-08-13 (×7): 125 ug via ORAL
  Filled 2014-08-06 (×9): qty 1

## 2014-08-06 NOTE — Progress Notes (Signed)
Orthostatic v/s: Lying bp-133/51 Hr-79 r-9  Sitting bp-144/64 Hr-76 r-10  Standing: bp-149/70 Hr-81 r-15  With slight dizziness when standing.

## 2014-08-06 NOTE — Evaluation (Signed)
Physical Therapy Evaluation Patient Details Name: Chad Huffman MRN: [id number] DOB: [birth year] Today's Date: 08/06/2014   History of Present Illness  Pt adm with acute on chronic hypercarbic respiratory failure and placed on bipap. Pt with ?broken pedicle screw in lower thoracic spine. PMH - multiple spinal surgeries with most recently a cervical surgery, COPD, AAA repair  Clinical Impression  Pt admitted with above diagnosis. Pt currently with functional limitations due to the deficits listed below (see PT Problem List).  Pt will benefit from skilled PT to increase their independence and safety with mobility to allow discharge to the venue listed below.       Follow Up Recommendations Home health PT (possibly)    Equipment Recommendations  None recommended by PT    Recommendations for Other Services       Precautions / Restrictions Precautions Precautions: Fall Required Braces or Orthoses: Cervical Brace Cervical Brace: Soft collar      Mobility  Bed Mobility                  Transfers Overall transfer level: Needs assistance Equipment used: Rolling walker (2 wheeled) Transfers: Sit to/from Stand Sit to Stand: Min guard         General transfer comment: for safety  Ambulation/Gait Ambulation/Gait assistance: Min guard Ambulation Distance (Feet): 220 Feet Assistive device: Rolling walker (2 wheeled) Gait Pattern/deviations: Step-through pattern;Decreased stride length   Gait velocity interpretation: Below normal speed for age/gender General Gait Details: Assist for safety due to pt states at times his knees buckle without warning  Stairs            Wheelchair Mobility    Modified Rankin (Stroke Patients Only)       Balance Overall balance assessment: Needs assistance Sitting-balance support: No upper extremity supported Sitting balance-Leahy Scale: Good     Standing balance support: No upper extremity supported Standing balance-Leahy  Scale: Fair                               Pertinent Vitals/Pain      Home Living Family/patient expects to be discharged to:: Private residence Living Arrangements: Alone   Type of Home: House Home Access: Stairs to enter Entrance Stairs-Rails: Right Entrance Stairs-Number of Steps: 4 Home Layout: One level Home Equipment: Walker - 2 wheels;Cane - single point      Prior Function Level of Independence: Independent with assistive device(s)         Comments: used cane primarly     Hand Dominance        Extremity/Trunk Assessment   Upper Extremity Assessment: Defer to OT evaluation           Lower Extremity Assessment: Overall WFL for tasks assessed         Communication   Communication: No difficulties  Cognition Arousal/Alertness: Awake/alert Behavior During Therapy: WFL for tasks assessed/performed Overall Cognitive Status: Within Functional Limits for tasks assessed                      General Comments      Exercises        Assessment/Plan    PT Assessment    PT Diagnosis Difficulty walking   PT Problem List    PT Treatment Interventions     PT Goals (Current goals can be found in the Care Plan section) Acute Rehab PT Goals Patient Stated Goal: return home PT Goal  Formulation: With patient Time For Goal Achievement: 08/13/14 Potential to Achieve Goals: Good    Frequency     Barriers to discharge        Co-evaluation               End of Session Equipment Utilized During Treatment: Gait belt;Cervical collar Activity Tolerance: Patient tolerated treatment well Patient left: in chair;with call bell/phone within reach;Other (comment) (MD present) Nurse Communication: Mobility status         Time: 2956-21301501-1515 PT Time Calculation (min) (ACUTE ONLY): 14 min   Charges:   PT Evaluation $Initial PT Evaluation Tier I: 1 Procedure     PT G Codes:        Chad Huffman 08/06/2014, 3:36 PM  [facility]  PT [phone]

## 2014-08-06 NOTE — Progress Notes (Signed)
[facility] TEAM 1 - Stepdown/ICU TEAM Progress Note  Chad Huffman ZOX:[id number] DOB: 12-28-1945 DOA: 08/04/2014 PCP: Dorothey Baseman, MD  Admit HPI / Brief Narrative: 69 y.o. WM PMHx hypertension, carpal tunnel syndrome, psoriasis, AAA repair 2009, hypothyroidism, COPD, C2 to C7 fusion 2012, revision at [facility] 03/2014, and L4-L5 decompression 2013 who was transferred from [facility]. His only complaint was tingling in his neck with minimal pain. Patient presented to the other facility after a fall with complaint of low back pain. EMS also found that he was hypercarbic. It is unclear whether he passed out or had head injury or neck injury. Patient was found to be significantly hypercarbic with acidosis with PCO2 in the 80s pH 7.2, chest x-ray was clear, CT chest PE protocol was negative for pneumonia or pulmonary embolism, EKG was unremarkable. The patient was sent here for further workup, but it is not clear why transfer from [facility] was necessary.  On review of the record he has chronic low back pain, chronic neck pain, chronic right shoulder and right hip pain. The patient follows with orthopedics Dr.Tuohy and spine surgeon Dr Mindi Junker (neurosurgery) [phone]    at [facility] and has recently been admitted at [facility] for multiple syncopal episodes w/ workup being unremarkable.   HPI/Subjective: 2/16  A/O 4, patient states uses Symbicort at home, has never been evaluated for sleep apnea (does not believe he needs to have evaluation).   Assessment/Plan: Chronic back and neck pain - S/P cervical spinal fusion w/ redo Oct 2015 - S/P lumbar laminectomy -Thoracic Xray at [facility] suggested ?fx of lower most R pedicle screw of posterior fusion, but CT cervical spine at [facility] indicates intact cervical fusion hardware w/ no evidence of acute traumatic injury  -Patient continues to have some upper extremity weakness will obtain MRI of thoracic spine. To definitively  evaluate for hardware destruction. -Spoke with  Dr Mindi Junker (neurosurgery) [phone], a stated that patient has very complicated spinal issues which he has been working on. Reviewed findings of x-ray and C-spine CT and Dr Raynald Kemp felt no further workup on his spinal issues warranted. Request that patient follow-up as outpatient after syncopal workup complete. -Counseled patient that the ED had attempted to have him transferred on admission to [facility] and they had declined his transfer secondary to having no beds available.  -Patient evaluated by Dr.Ernesto Sandrea Matte (neurosurgery) [place] who felt no need for any surgical intervention, - the pt feels more comfortable wearing a soft cervical collar   Acute hypoxic and hypercarbic resp failure due to COPD w/ acute exacerbation/HCAP - Continue current antibiotics -Flutter valve -DuoNeb -Mucinex DM BID -Requiring continuous BIPAP - PCCM following w/ [place]  -OSA with CO2 retention? Patient did not appear to be very interested in determining if he has sleep apnea. -ABG in a.m. -Respiratory viral panel pending -Sputum culture pending  Cardiomyopathy? -A chest x-ray patient has cardiomyopathy, most likely undiagnosed given patient's temperament -See syncope workup  Syncope and collapse -CT of chest negative for PE at [facility] - suspect this was related to hypercarbia due to COPD  -Orthostatic vitals -Obtain echocardiogram -Head CT negative -TSH high, with free T4 borderline low -Brain MRI  Hyponatremia -Most likely secondary to heart failure, and hypothyroidism.  Hypothyroidism -Patient's TSH/free T4 borderline -Increase Synthroid to 125 g daily (patient reported taking 100 g daily)   B LE edema  -Reportedly a chronic issue - mild/modest at present   HTN -Adjust medical tx and  follow    Code Status: FULL Family Communication: no family present at time of exam Disposition Plan: Complete syncope workup, and arrange for  outpatient follow-up with Dr Mindi Junker (neurosurgery) [phone],    Consultants: Dr.Ernesto Sandrea Matte (neurosurgery) Dr Mindi Junker (neurosurgery) [phone]; phone consult    Procedure/Significant Events: 2/15 CT head/C-spine without contrast;-No evidence of an acute traumatic intracranial injury -No evidence of acute cervical spine fracture- Intact appearances of the cervical fusion hardware 2/16 PCXR;Mild patchy right base opacity, and right hemidiaphragm elevation. .   Culture 2/16 respiratory virus panel pending 2/16 sputum culture pending    Antibiotics: Levaquin 2/14>> Vancomycin 2/15>>  DVT prophylaxis: Lovenox   Devices NA   LINES / TUBES:      Continuous Infusions: . sodium chloride 10 mL/hr at 08/04/14 2302    Objective: VITAL SIGNS: Temp: 98 F (36.7 C) (02/16 1135) Temp Source: Oral (02/16 1135) BP: 131/63 mmHg (02/16 1135) Pulse Rate: 90 (02/16 1135) SPO2; FIO2:   Intake/Output Summary (Last 24 hours) at 08/06/14 1506 Last data filed at 08/06/14 1400  Gross per 24 hour  Intake   1210 ml  Output   1400 ml  Net   -190 ml     Exam: General: A/O 4, NAD, No acute respiratory distress Lungs: Clear to auscultation left lung lobes, positive right upper lobe rhonchi, decreased breath sounds right lower lobe. without wheezes or crackles Cardiovascular: Regular rate and rhythm without murmur gallop or rub normal S1 and S2 Abdomen: Nontender, nondistended, soft, bowel sounds positive, no rebound, no ascites, no appreciable mass Extremities: No significant cyanosis, clubbing, or edema bilateral lower extremities Neurologic; observed patient ambulating with physical therapy appeared to have some upper extremity, lower extremity weakness, and difficulty maintaining balance.  Data Reviewed: Basic Metabolic Panel:  Recent Labs Lab 08/04/14 2100 08/05/14 0959 08/06/14 0210  NA 133* 132* 131*  K 5.5* 5.0 5.0  CL 94* 93* 94*  CO2 31 34*  34*  GLUCOSE 136* 148* 140*  BUN CREATININE 0.98 0.96 1.01  CALCIUM 8.6 8.7 8.4  MG  --   --  2.5  PHOS 4.6  --  3.8   Liver Function Tests:  Recent Labs Lab 08/04/14 2100 08/05/14 0959  AST 18 19  ALT 15 13  ALKPHOS 79 69  BILITOT 1.5* 1.6*  PROT 7.2 6.2  ALBUMIN 4.2 3.4*   No results for input(s): LIPASE, AMYLASE in the last 168 hours. No results for input(s): AMMONIA in the last 168 hours. CBC:  Recent Labs Lab 08/04/14 2100 08/05/14 0915 08/06/14 0210  WBC 7.4 4.0 6.2  NEUTROABS 7.1 3.5  --   HGB 13.2 11.7* 11.1*  HCT 42.7 37.2* 35.0*  MCV 93.0 89.0 89.1  PLT 180 190 169   Cardiac Enzymes:  Recent Labs Lab 08/04/14 2100 08/05/14 0224 08/05/14 0959  TROPONINI <0.03 <0.03 <0.03   BNP (last 3 results)  Recent Labs  08/04/14 2100  BNP 112.1*    ProBNP (last 3 results) No results for input(s): PROBNP in the last 8760 hours.  CBG: No results for input(s): GLUCAP in the last 168 hours.  No results found for this or any previous visit (from the past 240 hour(s)).   Studies:  Recent x-ray studies have been reviewed in detail by the Attending Physician  Scheduled Meds:  Scheduled Meds: . ALPRAZolam  1 mg Oral BID  . antiseptic oral rinse  7 mL Mouth Rinse q12n4p  . aspirin EC  81  mg Oral Daily  . baclofen  10 mg Oral TID  . budesonide  0.25 mg Nebulization BID  . chlorhexidine  15 mL Mouth Rinse BID  . enoxaparin (LOVENOX) injection  40 mg Subcutaneous Q24H  . ipratropium-albuterol  3 mL Nebulization Q6H  . levofloxacin (LEVAQUIN) IV  750 mg Intravenous Q24H  . levothyroxine  50 mcg Oral QAC breakfast  . losartan  50 mg Oral Daily  . methylPREDNISolone (SOLU-MEDROL) injection  60 mg Intravenous Q6H  . multivitamin with minerals  1 tablet Oral Daily  . nortriptyline  25 mg Oral BID  . oxyCODONE  5 mg Oral Once  . sertraline  50 mg Oral Daily  . sodium chloride  3 mL Intravenous Q12H  . vancomycin  1,000 mg Intravenous Q12H     Time spent on care of this patient: 40 mins   Drema DallasWOODS, CURTIS, J , [facility]  [facility] Office  [phone] Pager - [phone]  On-Call/Text Page:      [email or web address]      password [email or web address]  If 7PM-7AM, please contact night-coverage [email or web address] Password [email or web address] 08/06/2014, 3:06 PM   LOS: 2 days   Care during the described time interval was provided by me .  I have reviewed this patient's available data, including medical history, events of note, physical examination, radiology studies and test results as part of my evaluation  Carolyne Littlesurtis Woods, MD [phone] Pager

## 2014-08-06 NOTE — Consult Note (Signed)
NAMDonnetta Simpers:  Wissink, Dreux                 ACCOUNT NO.:  [id number]  MEDICAL RECORD NO.:  [id number]  LOCATION:  2H25C                        FACILITY:  [facility]  PHYSICIAN:  Hilda LiasErnesto Fredric Slabach, M.D.   DATE OF BIRTH:  [birth year]  DATE OF CONSULTATION: DATE OF DISCHARGE:                                CONSULTATION   Mr. Marina Goodellerry is a gentleman who was taken to [facility] yesterday afternoon after he fell at home.  One of the reasons we were called because he was complaining of some lower back pain and some pain in the legs and it was found in the x-ray that one of the pedicle screws from previous surgery was broken.  Because he has a high CO2 and because he had been taken care at [facility], the emergency room tried to contact [facility], but they have no ICU bed.  Because of that, they called the hospitalist here in [facility].  I was called yet to let me know that the patient was coming here to see something can be done for the pedicle screw in the lower thoracic area.  The patient was admitted to the ICU, and I came this morning to see him, he was having respiratory treatment.  After a while, I was able to talk to him. The patient himself is really upset because according to him he was misled.  He told me that when he was at [facility] they told him that the only way for him to be taken care was  to be seen by [facility].  He had told me that he did not want to come to [facility], but he wanted to go to [facility] where he knows his MD and his Careers advisersurgeon.  It seemed that this gentleman has had 3 spine surgeries, the last one in October. Right now, he is complaining of some neck pain and he complained of some tingling in both hands.  He tells me that he is able to write.  Again, I do not have any neurological record to find out how he was before he fell yesterday.  The only thing he is awake, oriented.  He moves all 4 extremities.  He is complaining of pain in both shoulders,  and the only finding I see that he is complaining of some tingling in both hands and he is unable to close completely both hands.  Reflexes are symmetrical. I cannot see the x-ray of the thoracic area because it is impossible to see from here from the Epic system which is not compatible with the system of [place].  We did a CT scan of the cervical spine last night which showed hardware from the occipital to upper thoracic area with no evidence of any fracture or misalignment.  I do believe that probably if his neurological examination was normal before he fell probably he might have some cervical central cord injury and that is why he had the weakness in both hands and the tingling, but I am not quite sure. Nevertheless, whatever he has, there is no need for any surgical intervention, but nevertheless, the patient does not want anything to be done here in [place].  He wants to be transferred as soon as possible to [facility].  I told him that I will be leaving a message for the doctor in charge of him to see if he can come back to [facility] for him to be transferred over there for further care.  I will continue to follow him, but right now, he is in a cervical collar yet to support his neck.          ______________________________ Hilda Lias, M.D.     EB/MEDQ  D:  08/05/2014  T:  08/06/2014  Job:  [id number]

## 2014-08-06 NOTE — Progress Notes (Signed)
PULMONARY / CRITICAL CARE MEDICINE   Name: Chad Huffman MRN: [id number] DOB: 29-May-1946    ADMISSION DATE:  08/04/2014 CONSULTATION DATE:  2/14  REFERRING MD :  Allena Katz  CHIEF COMPLAINT:  AECOPD  INITIAL PRESENTATION: 69yo male with hx HTN, COPD, recent cervical spine surgery who presented to [facility] 2/14 with worsening SOB and BLE edema and ultimately had a fall at home.  Pt denies LOC but was "dizzy" prior to fall, ?r/t hypercarbia.  Was hypercarbic on ABG in ER, placed on bipap and tx to [facility]. Admitted by [facility] and was improving but had difficulty tolerating bipap and PCCM consulted.   STUDIES:  CTA chest ([place]) 2/14>>> neg PE, elevated R hemidiaphragm, otherwise neg acute  Thoracic spine 2/14>>> ? Fracture of lower most R pedicle screw of posterior fusion.   SIGNIFICANT EVENTS:  SUBJECTIVE: no events overnight, did not use BiPAP overnight.  VITAL SIGNS: Temp:  [97.1 F (36.2 C)-99.1 F (37.3 C)] 97.1 F (36.2 C) (02/16 0735) Pulse Rate:  [64-96] 80 (02/16 0735) Resp:  [13-23] 15 (02/16 0735) BP: (112-161)/(48-76) 142/64 mmHg (02/16 0735) SpO2:  [86 %-97 %] 91 % (02/16 0735) FiO2 (%):  [40 %-50 %] 50 % (02/15 2000) HEMODYNAMICS:   VENTILATOR SETTINGS: Vent Mode:  [-]  FiO2 (%):  [40 %-50 %] 50 % INTAKE / OUTPUT:  Intake/Output Summary (Last 24 hours) at 08/06/14 1040 Last data filed at 08/06/14 0800  Gross per 24 hour  Intake    810 ml  Output   1600 ml  Net   -790 ml   PHYSICAL EXAMINATION: General:  wdwn male, NAD  Neuro:  Alert and interactive, answering questions appropriately, moving all ext to command. HEENT:  Mm dry, no JVD, soft c-collar  Cardiovascular:  s1s2 rrr Lungs:  resps even non labored on [place] Abdomen:  Soft, +bs, non tender  Musculoskeletal:  Warm and dry, 2+ BLE edema  LABS:  CBC  Recent Labs Lab 08/04/14 2100 08/05/14 0915 08/06/14 0210  WBC 7.4 4.0 6.2  HGB 13.2 11.7* 11.1*  HCT 42.7 37.2* 35.0*  PLT 180 190 169    Coag's  Recent Labs Lab 08/04/14 2100  APTT 44*  INR 1.04   BMET  Recent Labs Lab 08/04/14 2100 08/05/14 0959 08/06/14 0210  NA 133* 132* 131*  K 5.5* 5.0 5.0  CL 94* 93* 94*  CO2 31 34* 34*  BUN CREATININE 0.98 0.96 1.01  GLUCOSE 136* 148* 140*   Electrolytes  Recent Labs Lab 08/04/14 2100 08/05/14 0959 08/06/14 0210  CALCIUM 8.6 8.7 8.4  MG  --   --  2.5  PHOS 4.6  --  3.8   Sepsis Markers No results for input(s): LATICACIDVEN, PROCALCITON, O2SATVEN in the last 168 hours. ABG  Recent Labs Lab 08/04/14 2055 08/05/14 0110  PHART 7.251* 7.283*  PCO2ART 80.6* 74.8*  PO2ART 88.9 81.2   Liver Enzymes  Recent Labs Lab 08/04/14 2100 08/05/14 0959  AST 18 19  ALT 15 13  ALKPHOS 79 69  BILITOT 1.5* 1.6*  ALBUMIN 4.2 3.4*   Cardiac Enzymes  Recent Labs Lab 08/04/14 2100 08/05/14 0224 08/05/14 0959  TROPONINI <0.03 <0.03 <0.03   Glucose No results for input(s): GLUCAP in the last 168 hours.  Imaging Ct Head Wo Contrast  08/05/2014   CLINICAL DATA:  Fall with head and neck pain. Recent cervical fusion  EXAM: CT HEAD WITHOUT CONTRAST  CT CERVICAL SPINE WITHOUT CONTRAST  TECHNIQUE: Multidetector CT  imaging of the head and cervical spine was performed following the standard protocol without intravenous contrast. Multiplanar CT image reconstructions of the cervical spine were also generated.  COMPARISON:  None.  11/23/2010  FINDINGS: CT HEAD FINDINGS  There is no intracranial hemorrhage or extra-axial fluid collection.  The brain and CSF spaces appear unremarkable.  The calvarium and skullbase are intact.  CT CERVICAL SPINE FINDINGS  There is extensive posterior decompression with instrumented fusion. The vertical interconnecting hardware extends up onto the occipital skull and down into the upper thoracic spine. There is no evidence of an acute cervical spine fracture. The vertebral column is intact. The fixation hardware and remaining posterior  elements are intact. No acute soft tissue abnormality is evident.  IMPRESSION: 1. No evidence of an acute traumatic intracranial injury 2. No evidence of acute cervical spine fracture 3. Intact appearances of the cervical fusion hardware.   Electronically Signed   By: Ellery Plunkaniel R Mitchell M.D.   On: 08/05/2014 03:54   Ct Cervical Spine Wo Contrast  08/05/2014   CLINICAL DATA:  Fall with head and neck pain. Recent cervical fusion  EXAM: CT HEAD WITHOUT CONTRAST  CT CERVICAL SPINE WITHOUT CONTRAST  TECHNIQUE: Multidetector CT imaging of the head and cervical spine was performed following the standard protocol without intravenous contrast. Multiplanar CT image reconstructions of the cervical spine were also generated.  COMPARISON:  None.  11/23/2010  FINDINGS: CT HEAD FINDINGS  There is no intracranial hemorrhage or extra-axial fluid collection.  The brain and CSF spaces appear unremarkable.  The calvarium and skullbase are intact.  CT CERVICAL SPINE FINDINGS  There is extensive posterior decompression with instrumented fusion. The vertical interconnecting hardware extends up onto the occipital skull and down into the upper thoracic spine. There is no evidence of an acute cervical spine fracture. The vertebral column is intact. The fixation hardware and remaining posterior elements are intact. No acute soft tissue abnormality is evident.  IMPRESSION: 1. No evidence of an acute traumatic intracranial injury 2. No evidence of acute cervical spine fracture 3. Intact appearances of the cervical fusion hardware.   Electronically Signed   By: Ellery Plunkaniel R Mitchell M.D.   On: 08/05/2014 03:54   ASSESSMENT / PLAN:  PULMONARY Acute on chronic hypercarbic respiratory failure Respiratory acidosis AECOPD  P:   Titrate O2 for sats of 88-92% Empiric abx as below Solumedrol for another 24 hours then may change to prednisone PO and begin taper BD's - changed duonebs to q6  D/c symbicort given HR Budesonide as ordered F/u  CXR noted, no further need for repeat at this point.  CARDIOVASCULAR Hx HTN  BLE edema  P:  Hold further diureses at this point.  RENAL Hyperkalemia  P:   BMET daily. D/C lasix. Replace electrolytes as indicated.  GASTROINTESTINAL No active issue  P:   Begin diet  HEMATOLOGIC No active issue  P:  SCD's   INFECTIOUS AECOPD  P:   Sputum 2/14>>> Vancomycin 2/14>>> Levaquin 2/14>>>  Would recommend d/c of vancomycin now and continue levaquin for a total of 8 days.  ENDOCRINE Hypothyroid - tsh 4.536 P:   Cont synthroid  T3, t4 will defer to primary  NEUROLOGIC Recent cervical fusion  ?? Fracture of pedicle screw in fall P:   CT spine noted  Cont soft collar  NSG saw, no further interventions at this time.  Patient asking to be transferred to [facility].  Will defer that to primary at this point.  Continue to titrate O2 down.  PCCM will be available PRN.  FAMILY  - Updates: patient updated bedside  - Inter-disciplinary family meet or Palliative Care meeting due by:  2/21  The patient is critically ill with multiple organ systems failure and requires high complexity decision making for assessment and support, frequent evaluation and titration of therapies, application of advanced monitoring technologies and extensive interpretation of multiple databases.   Critical Care Time devoted to patient care services described in this note is  35  Minutes. This time reflects time of care of this signee Dr Koren Bound. This critical care time does not reflect procedure time, or teaching time or supervisory time of PA/NP/Med student/Med Resident etc but could involve care discussion time.  Alyson Reedy, M.D. [facility] Pulmonary/Critical Care Medicine. Pager: [phone]. After hours pager: [phone].

## 2014-08-06 NOTE — Progress Notes (Signed)
Neuro stable. Wants to be transferred to [facility]

## 2014-08-06 NOTE — Progress Notes (Signed)
Got a call from MRI dept . that pt refused to have MRI of the Brain. MD made aware . Called back MRI dept and staff claimed that he finally  decided  to have it done.

## 2014-08-07 DIAGNOSIS — R55 Syncope and collapse: Secondary | ICD-10-CM

## 2014-08-07 LAB — COMPREHENSIVE METABOLIC PANEL
ALBUMIN: 3 g/dL — AB (ref 3.5–5.2)
ALT: 9 U/L (ref 0–53)
ANION GAP: 7 (ref 5–15)
AST: 35 U/L (ref 0–37)
Alkaline Phosphatase: 54 U/L (ref 39–117)
BUN: 26 mg/dL — ABNORMAL HIGH (ref 6–23)
CALCIUM: 8.5 mg/dL (ref 8.4–10.5)
CO2: 28 mmol/L (ref 19–32)
Chloride: 92 mmol/L — ABNORMAL LOW (ref 96–112)
Creatinine, Ser: 1.13 mg/dL (ref 0.50–1.35)
GFR calc non Af Amer: 66 mL/min — ABNORMAL LOW (ref >90)
GFR, EST AFRICAN AMERICAN: 76 mL/min — AB (ref >90)
GLUCOSE: 160 mg/dL — AB (ref 70–99)
Potassium: 5.7 mmol/L — ABNORMAL HIGH (ref 3.5–5.1)
Sodium: 127 mmol/L — ABNORMAL LOW (ref 135–145)
TOTAL PROTEIN: 5.5 g/dL — AB (ref 6.0–8.3)
Total Bilirubin: 1.4 mg/dL — ABNORMAL HIGH (ref 0.3–1.2)

## 2014-08-07 LAB — CBC WITH DIFFERENTIAL/PLATELET
BASOS ABS: 0 10*3/uL (ref 0.0–0.1)
Basophils Relative: 0 % (ref 0–1)
Eosinophils Absolute: 0 10*3/uL (ref 0.0–0.7)
Eosinophils Relative: 0 % (ref 0–5)
HEMATOCRIT: 38.1 % — AB (ref 39.0–52.0)
Hemoglobin: 11.9 g/dL — ABNORMAL LOW (ref 13.0–17.0)
LYMPHS PCT: 3 % — AB (ref 12–46)
Lymphs Abs: 0.2 10*3/uL — ABNORMAL LOW (ref 0.7–4.0)
MCH: 27.9 pg (ref 26.0–34.0)
MCHC: 31.2 g/dL (ref 30.0–36.0)
MCV: 89.4 fL (ref 78.0–100.0)
MONO ABS: 0.5 10*3/uL (ref 0.1–1.0)
Monocytes Relative: 6 % (ref 3–12)
Neutro Abs: 6.9 10*3/uL (ref 1.7–7.7)
Neutrophils Relative %: 91 % — ABNORMAL HIGH (ref 43–77)
Platelets: 184 10*3/uL (ref 150–400)
RBC: 4.26 MIL/uL (ref 4.22–5.81)
RDW: 15.5 % (ref 11.5–15.5)
WBC: 7.6 10*3/uL (ref 4.0–10.5)

## 2014-08-07 LAB — MAGNESIUM: MAGNESIUM: 2.4 mg/dL (ref 1.5–2.5)

## 2014-08-07 MED ORDER — PREDNISONE 20 MG PO TABS
40.0000 mg | ORAL_TABLET | Freq: Every day | ORAL | Status: DC
  Administered 2014-08-08 – 2014-08-09 (×2): 40 mg via ORAL
  Filled 2014-08-07 (×3): qty 2

## 2014-08-07 MED ORDER — SODIUM POLYSTYRENE SULFONATE 15 GM/60ML PO SUSP
30.0000 g | Freq: Once | ORAL | Status: AC
  Administered 2014-08-07: 30 g via ORAL
  Filled 2014-08-07: qty 120

## 2014-08-07 MED ORDER — FUROSEMIDE 10 MG/ML IJ SOLN
20.0000 mg | Freq: Once | INTRAMUSCULAR | Status: AC
  Administered 2014-08-07: 20 mg via INTRAVENOUS
  Filled 2014-08-07: qty 2

## 2014-08-07 NOTE — Progress Notes (Addendum)
Soft cervical collar removed per pt request.  Pt states " I don't need it."

## 2014-08-07 NOTE — Progress Notes (Signed)
[place] TEAM 1 - Stepdown/ICU TEAM Progress Note  Chad FELMLEE CZY:[id number] DOB: 1945/09/17 DOA: 08/04/2014 PCP: Dorothey Baseman, MD  Admit HPI / Brief Narrative: 69 y.o. WM PMHx hypertension, carpal tunnel syndrome, psoriasis, AAA repair 2009, hypothyroidism, COPD, C2 to C7 fusion 2012, revision at [facility] 03/2014, and L4-L5 decompression 2013 who was transferred from [facility]. His only complaint was tingling in his neck with minimal pain. Patient presented to the other facility after a fall with complaint of low back pain. EMS also found that he was hypercarbic. It is unclear whether he passed out or had head injury or neck injury. Patient was found to be significantly hypercarbic with acidosis with PCO2 in the 80s pH 7.2, chest x-ray was clear, CT chest PE protocol was negative for pneumonia or pulmonary embolism, EKG was unremarkable. The patient was sent here for further workup, but it is not clear why transfer from [facility] was necessary.  On review of the record he has chronic low back pain, chronic neck pain, chronic right shoulder and right hip pain. The patient follows with orthopedics Dr.Tuohy and spine surgeon Dr Mindi Junker (neurosurgery) [phone]    at [facility] and has recently been admitted at [facility] for multiple syncopal episodes w/ workup being unremarkable.   HPI/Subjective: 2/17. Denies any shortness of breath. He reports that he never felt short of breath, even on admission. He continues to have neck pain. Denies any other complaints.   Assessment/Plan: Chronic back and neck pain - S/P cervical spinal fusion w/ redo Oct 2015 - S/P lumbar laminectomy -Thoracic Xray at [facility] suggested ?fx of lower most R pedicle screw of posterior fusion, but CT cervical spine at [facility] indicates intact cervical fusion hardware w/ no evidence of acute traumatic injury  -Spoke with  Dr Mindi Junker (neurosurgery) [phone], who stated that patient has  very complicated spinal issues which he has been working on. Reviewed findings of x-ray and C-spine CT and Dr Raynald Kemp felt no further workup on his spinal issues warranted. Request that patient follow-up as outpatient after syncopal workup complete. -Counseled patient that the ED had attempted to have him transferred on admission to [facility] and they had declined his transfer secondary to having no beds available.  -Patient evaluated by Dr.Ernesto Sandrea Matte (neurosurgery) [place] who felt no need for any surgical intervention, - the pt feels more comfortable wearing a soft cervical collar   Acute hypoxic and hypercarbic resp failure due to COPD w/ acute exacerbation/HCAP - Continue levaquin for a total of 8 days -Flutter valve -DuoNeb -Mucinex DM BID -PCCM following for Bipap issues. Weaning off bipap  -OSA with CO2 retention? Patient did not appear to be very interested in determining if he has sleep apnea. -ABG in a.m. -Respiratory viral panel pending -Sputum culture pending -Currently patient is still requiring 5L of oxygen. He reports that he was on room air at home. Will try and wean down oxygen.  Syncope and collapse -CT of chest negative for PE at [facility] - suspect this was related to hypercarbia due to COPD  -Orthostatic vitals -Echocardiogram unremarkable -Head CT negative -TSH high, with free T4 borderline low -Brain MRI unremarkable -?if syncope related to hypercarbia  Hyponatremia -Most likely secondary to heart failure, and hypothyroidism.  Hypothyroidism -Patient's TSH/free T4 borderline -Increased Synthroid to 125 g daily (patient reported taking 100 g daily)   B LE edema  -Reportedly a chronic issue - mild/modest at present, gave one dose of lasix today  HTN -Adjust medical tx and follow    Code Status: FULL Family Communication: no family present at time of exam Disposition Plan: Complete syncope workup, and arrange for outpatient follow-up with Dr  Mindi JunkerWesley Hsu (neurosurgery) [phone],    Consultants: Dr.Ernesto Sandrea MatteM Botero (neurosurgery) Dr Mindi JunkerWesley Hsu (neurosurgery) [phone]; phone consult    Procedure/Significant Events: 2/15 CT head/C-spine without contrast;-No evidence of an acute traumatic intracranial injury -No evidence of acute cervical spine fracture- Intact appearances of the cervical fusion hardware 2/16 PCXR;Mild patchy right base opacity, and right hemidiaphragm elevation. .   Culture 2/16 respiratory virus panel pending 2/16 sputum culture pending    Antibiotics: Levaquin 2/14>> Vancomycin 2/15>>2/17  DVT prophylaxis: Lovenox   Devices NA   LINES / TUBES:      Continuous Infusions: . sodium chloride 10 mL/hr at 08/04/14 2302    Objective: VITAL SIGNS: Temp: 97.6 F (36.4 C) (02/17 1225) Temp Source: Oral (02/17 1225) BP: 157/68 mmHg (02/17 1225) Pulse Rate: 81 (02/17 0954) SPO2; FIO2:   Intake/Output Summary (Last 24 hours) at 08/07/14 1554 Last data filed at 08/07/14 1300  Gross per 24 hour  Intake   1250 ml  Output   1325 ml  Net    -75 ml     Exam: General: A/O 4, NAD, No acute respiratory distress Lungs: diminished breath sounds bilaterally, otherwise clear. Cardiovascular: Regular rate and rhythm without murmur gallop or rub normal S1 and S2 Abdomen: Nontender, nondistended, soft, bowel sounds positive, no rebound, no ascites, no appreciable mass Extremities: No significant cyanosis, clubbing, or edema bilateral lower extremities Neurologic; 5/5 strength B U & LE - intact sensation touch th/o  Data Reviewed: Basic Metabolic Panel:  Recent Labs Lab 08/04/14 2100 08/05/14 0959 08/06/14 0210 08/07/14 0243  NA 133* 132* 131* 127*  K 5.5* 5.0 5.0 5.7*  CL 94* 93* 94* 92*  CO2 31 34* 34* 28  GLUCOSE 136* 148* 140* 160*  BUN 16 18 21  26*  CREATININE 0.98 0.96 1.01 1.13  CALCIUM 8.6 8.7 8.4 8.5  MG  --   --  2.5 2.4  PHOS 4.6  --  3.8  --    Liver Function  Tests:  Recent Labs Lab 08/04/14 2100 08/05/14 0959 08/07/14 0243  AST 18 19 35  ALT 15 13 9   ALKPHOS 79 69 54  BILITOT 1.5* 1.6* 1.4*  PROT 7.2 6.2 5.5*  ALBUMIN 4.2 3.4* 3.0*   No results for input(s): LIPASE, AMYLASE in the last 168 hours. No results for input(s): AMMONIA in the last 168 hours. CBC:  Recent Labs Lab 08/04/14 2100 08/05/14 0915 08/06/14 0210 08/07/14 0243  WBC 7.4 4.0 6.2 7.6  NEUTROABS 7.1 3.5  --  6.9  HGB 13.2 11.7* 11.1* 11.9*  HCT 42.7 37.2* 35.0* 38.1*  MCV 93.0 89.0 89.1 89.4  PLT 180 190 169 184   Cardiac Enzymes:  Recent Labs Lab 08/04/14 2100 08/05/14 0224 08/05/14 0959  TROPONINI <0.03 <0.03 <0.03   BNP (last 3 results)  Recent Labs  08/04/14 2100  BNP 112.1*    ProBNP (last 3 results) No results for input(s): PROBNP in the last 8760 hours.  CBG: No results for input(s): GLUCAP in the last 168 hours.  No results found for this or any previous visit (from the past 240 hour(s)).   Studies:  Recent x-ray studies have been reviewed in detail by the Attending Physician  Scheduled Meds:  Scheduled Meds: . ALPRAZolam  1 mg Oral BID  . antiseptic oral  rinse  7 mL Mouth Rinse q12n4p  . aspirin EC  81 mg Oral Daily  . baclofen  10 mg Oral TID  . budesonide  0.25 mg Nebulization BID  . chlorhexidine  15 mL Mouth Rinse BID  . enoxaparin (LOVENOX) injection  40 mg Subcutaneous Q24H  . ipratropium-albuterol  3 mL Nebulization Q6H  . levofloxacin (LEVAQUIN) IV  750 mg Intravenous Q24H  . levothyroxine  125 mcg Oral QAC breakfast  . multivitamin with minerals  1 tablet Oral Daily  . nortriptyline  25 mg Oral BID  . oxyCODONE  5 mg Oral Once  . [START ON 08/08/2014] predniSONE  40 mg Oral Q breakfast  . sertraline  50 mg Oral Daily  . sodium chloride  3 mL Intravenous Q12H    Time spent on care of this patient: 30 mins   [facility]   [facility] Office  [phone] Pager - [phone]  On-Call/Text  Page:      [email or web address]      password [email or web address]  If 7PM-7AM, please contact night-coverage [email or web address] Password [email or web address] 08/07/2014, 3:54 PM   LOS: 3 days

## 2014-08-07 NOTE — Progress Notes (Signed)
  Echocardiogram 2D Echocardiogram has been performed.  Arvil ChacoFoster, Prisila Dlouhy 08/07/2014, 8:39 AM

## 2014-08-07 NOTE — Consult Note (Signed)
ANTIBIOTIC CONSULT NOTE - FOLLOW UP  Pharmacy Consult for Vancomycin and Levaquin Indication: COPD exacerbation  Allergies  Allergen Reactions  . Ampicillin Swelling  . Brimonidine   . Cyclobenzaprine   . Gabapentin Rash    Patient Measurements: Height: 5\' 10"  (177.8 cm) Weight: 246 lb 14.6 oz (112 kg) IBW/kg (Calculated) : 73  Vital Signs: Temp: 97.6 F (36.4 C) (02/17 1225) Temp Source: Oral (02/17 1225) BP: 157/68 mmHg (02/17 1225) Pulse Rate: 81 (02/17 0954) Intake/Output from previous day: 02/16 0701 - 02/17 0700 In: 950 [P.O.:400; IV Piggyback:550] Out: 1000 [Urine:1000] Intake/Output from this shift: Total I/O In: 680 [P.O.:480; IV Piggyback:200] Out: -   Labs:  Recent Labs  08/05/14 0915 08/05/14 0959 08/06/14 0210 08/07/14 0243  WBC 4.0  --  6.2 7.6  HGB 11.7*  --  11.1* 11.9*  PLT 190  --  169 184  CREATININE  --  0.96 1.01 1.13   Estimated Creatinine Clearance: 80.6 mL/min (by C-G formula based on Cr of 1.13).  Microbiology: No results found for this or any previous visit (from the past 720 hour(s)).  Medical History: Past Medical History  Diagnosis Date  . Antalgic gait   . Hypertension   . Lumbago   . COPD (chronic obstructive pulmonary disease)   . Complication of anesthesia     pt has had cervical fusion-limited neck flextion  . Depression   . DJD (degenerative joint disease)     Right shoulder  . Carotid artery occlusion    Assessment: 68yom with hx COPD presented to [place] with a fall and then started having respiratory distress. CT [facility] was negative for PE, pneumonia. [facility]. Currently on vancomycin and levaquin for probable COPD exacerbation. Renal function remains stable. WBC wnl. Pt is afebrile   Goal of Therapy:  Vancomycin trough level 15-20 mcg/ml  Plan:  1) Vancomycin 1g IV q12 2) Levaquin 750mg  IV q24 3) Follow renal function, cultures, LOT, level if needed 4)  Consider d/c'ing Vancomycin per CCM recommendations   Vinnie LevelBenjamin Amauri Medellin, PharmD., BCPS Clinical Pharmacist Pager [phone]

## 2014-08-08 DIAGNOSIS — E872 Acidosis: Secondary | ICD-10-CM | POA: Diagnosis present

## 2014-08-08 DIAGNOSIS — F32A Depression, unspecified: Secondary | ICD-10-CM | POA: Diagnosis present

## 2014-08-08 DIAGNOSIS — M4322 Fusion of spine, cervical region: Secondary | ICD-10-CM | POA: Diagnosis present

## 2014-08-08 DIAGNOSIS — F329 Major depressive disorder, single episode, unspecified: Secondary | ICD-10-CM

## 2014-08-08 DIAGNOSIS — J9612 Chronic respiratory failure with hypercapnia: Secondary | ICD-10-CM | POA: Diagnosis present

## 2014-08-08 DIAGNOSIS — Z9889 Other specified postprocedural states: Secondary | ICD-10-CM | POA: Diagnosis present

## 2014-08-08 LAB — BLOOD GAS, ARTERIAL
Acid-Base Excess: 9.6 mmol/L — ABNORMAL HIGH (ref 0.0–2.0)
Bicarbonate: 35.9 mEq/L — ABNORMAL HIGH (ref 20.0–24.0)
Drawn by: 29017
O2 CONTENT: 3 L/min
O2 Saturation: 86.1 %
PATIENT TEMPERATURE: 98.6
PH ART: 7.305 — AB (ref 7.350–7.450)
TCO2: 38.2 mmol/L (ref 0–100)
pCO2 arterial: 74.5 mmHg (ref 35.0–45.0)
pO2, Arterial: 56.6 mmHg — ABNORMAL LOW (ref 80.0–100.0)

## 2014-08-08 LAB — COMPREHENSIVE METABOLIC PANEL
ALK PHOS: 52 U/L (ref 39–117)
ALT: 11 U/L (ref 0–53)
AST: 16 U/L (ref 0–37)
Albumin: 3 g/dL — ABNORMAL LOW (ref 3.5–5.2)
Anion gap: 5 (ref 5–15)
BUN: 28 mg/dL — ABNORMAL HIGH (ref 6–23)
CALCIUM: 8.6 mg/dL (ref 8.4–10.5)
CHLORIDE: 87 mmol/L — AB (ref 96–112)
CO2: 37 mmol/L — AB (ref 19–32)
Creatinine, Ser: 1.24 mg/dL (ref 0.50–1.35)
GFR, EST AFRICAN AMERICAN: 68 mL/min — AB (ref >90)
GFR, EST NON AFRICAN AMERICAN: 59 mL/min — AB (ref >90)
GLUCOSE: 118 mg/dL — AB (ref 70–99)
POTASSIUM: 4.8 mmol/L (ref 3.5–5.1)
Sodium: 129 mmol/L — ABNORMAL LOW (ref 135–145)
Total Bilirubin: 1 mg/dL (ref 0.3–1.2)
Total Protein: 5.2 g/dL — ABNORMAL LOW (ref 6.0–8.3)

## 2014-08-08 LAB — CBC WITH DIFFERENTIAL/PLATELET
BASOS PCT: 0 % (ref 0–1)
Basophils Absolute: 0 10*3/uL (ref 0.0–0.1)
Eosinophils Absolute: 0 10*3/uL (ref 0.0–0.7)
Eosinophils Relative: 0 % (ref 0–5)
HEMATOCRIT: 40.1 % (ref 39.0–52.0)
Hemoglobin: 12.5 g/dL — ABNORMAL LOW (ref 13.0–17.0)
LYMPHS ABS: 0.9 10*3/uL (ref 0.7–4.0)
LYMPHS PCT: 11 % — AB (ref 12–46)
MCH: 28 pg (ref 26.0–34.0)
MCHC: 31.2 g/dL (ref 30.0–36.0)
MCV: 89.7 fL (ref 78.0–100.0)
Monocytes Absolute: 1.2 10*3/uL — ABNORMAL HIGH (ref 0.1–1.0)
Monocytes Relative: 14 % — ABNORMAL HIGH (ref 3–12)
Neutro Abs: 6.2 10*3/uL (ref 1.7–7.7)
Neutrophils Relative %: 75 % (ref 43–77)
Platelets: 166 10*3/uL (ref 150–400)
RBC: 4.47 MIL/uL (ref 4.22–5.81)
RDW: 15.3 % (ref 11.5–15.5)
WBC: 8.3 10*3/uL (ref 4.0–10.5)

## 2014-08-08 LAB — RESPIRATORY VIRUS PANEL
Adenovirus: NEGATIVE
INFLUENZA A: NEGATIVE
INFLUENZA B 1: NEGATIVE
Metapneumovirus: NEGATIVE
PARAINFLUENZA 3 A: NEGATIVE
Parainfluenza 1: NEGATIVE
Parainfluenza 2: NEGATIVE
RESPIRATORY SYNCYTIAL VIRUS A: NEGATIVE
Respiratory Syncytial Virus B: NEGATIVE
Rhinovirus: NEGATIVE

## 2014-08-08 LAB — URIC ACID: URIC ACID, SERUM: 4 mg/dL (ref 4.0–7.8)

## 2014-08-08 LAB — MAGNESIUM: Magnesium: 2.2 mg/dL (ref 1.5–2.5)

## 2014-08-08 MED ORDER — IPRATROPIUM-ALBUTEROL 0.5-2.5 (3) MG/3ML IN SOLN
3.0000 mL | Freq: Four times a day (QID) | RESPIRATORY_TRACT | Status: DC
  Administered 2014-08-08 – 2014-08-12 (×16): 3 mL via RESPIRATORY_TRACT
  Filled 2014-08-08 (×19): qty 3

## 2014-08-08 MED ORDER — SERTRALINE HCL 25 MG PO TABS
25.0000 mg | ORAL_TABLET | Freq: Every day | ORAL | Status: DC
  Administered 2014-08-09 – 2014-08-13 (×5): 25 mg via ORAL
  Filled 2014-08-08 (×5): qty 1

## 2014-08-08 MED ORDER — NORTRIPTYLINE HCL 10 MG PO CAPS
20.0000 mg | ORAL_CAPSULE | Freq: Two times a day (BID) | ORAL | Status: DC
  Administered 2014-08-08 – 2014-08-13 (×10): 20 mg via ORAL
  Filled 2014-08-08 (×11): qty 2

## 2014-08-08 NOTE — Progress Notes (Signed)
Physical Therapy Treatment Patient Details Name: Illene LabradorDanny L Micciche MRN: [id number] DOB: Feb 20, 1946 Today's Date: 08/08/2014    History of Present Illness Pt adm with acute on chronic hypercarbic respiratory failure and placed on bipap. Pt with ?broken pedicle screw in lower thoracic spine. PMH - multiple spinal surgeries with most recently a cervical surgery, COPD, AAA repair    PT Comments    Pt making steady progress.  Follow Up Recommendations  Home health PT     Equipment Recommendations  None recommended by PT    Recommendations for Other Services       Precautions / Restrictions Precautions Precautions: Fall Required Braces or Orthoses: Cervical Brace Cervical Brace: Soft collar;For comfort    Mobility  Bed Mobility                  Transfers Overall transfer level: Modified independent   Transfers: Sit to/from Stand              Ambulation/Gait Ambulation/Gait assistance: Min guard Ambulation Distance (Feet): 280 Feet Assistive device: Rolling walker (2 wheeled) Gait Pattern/deviations: Step-through pattern;Decreased stride length   Gait velocity interpretation: Below normal speed for age/gender General Gait Details: Assist for safety due to pt states at times his knees buckle without warning.   Stairs            Wheelchair Mobility    Modified Rankin (Stroke Patients Only)       Balance Overall balance assessment: Needs assistance Sitting-balance support: No upper extremity supported Sitting balance-Leahy Scale: Good     Standing balance support: No upper extremity supported Standing balance-Leahy Scale: Fair                      Cognition Arousal/Alertness: Awake/alert Behavior During Therapy: WFL for tasks assessed/performed Overall Cognitive Status: Within Functional Limits for tasks assessed                      Exercises      General Comments        Pertinent Vitals/Pain      Home Living                       Prior Function            PT Goals (current goals can now be found in the care plan section) Progress towards PT goals: Progressing toward goals    Frequency       PT Plan Current plan remains appropriate    Co-evaluation             End of Session Equipment Utilized During Treatment: Gait belt;Cervical collar;Oxygen Activity Tolerance: Patient tolerated treatment well Patient left: in chair;with call bell/phone within reach     Time: 1211-1224 PT Time Calculation (min) (ACUTE ONLY): 13 min  Charges:  $Gait Training: 8-22 mins                    G Codes:      Bravery Ketcham 08/08/2014, 12:58 PM  [employer] Astella Desir PT [phone]

## 2014-08-08 NOTE — Progress Notes (Signed)
Pt's blood gas results: pH=7.30, CO2=74.5, O2=56.6, Bicarb=35.9. Pt states no SOB or other symptoms; pt is having no pain. I also noticed his lab trend for his CO2 has been on the high side since admission. On-call NP notified. Will continue to monitor.

## 2014-08-08 NOTE — Progress Notes (Signed)
Placed patient on Bipap for the night with IPAP set at 10cm and EPAP set at 5cm. Oxygen set at 40%.

## 2014-08-08 NOTE — Progress Notes (Signed)
Physical Therapy Treatment Patient Details Name: Chad Huffman MRN: [id number] DOB: 01-07-1946 Today's Date: 08/08/2014    History of Present Illness Pt adm with acute on chronic hypercarbic respiratory failure and placed on bipap. Pt with ?broken pedicle screw in lower thoracic spine. PMH - multiple spinal surgeries with most recently a cervical surgery, COPD, AAA repair    PT Comments    Pt making steady progress.  Follow Up Recommendations  Home health PT     Equipment Recommendations  None recommended by PT    Recommendations for Other Services       Precautions / Restrictions Precautions Precautions: Fall Required Braces or Orthoses: Cervical Brace Cervical Brace: Soft collar;For comfort    Mobility  Bed Mobility                  Transfers Overall transfer level: Modified independent   Transfers: Sit to/from Stand              Ambulation/Gait Ambulation/Gait assistance: Min guard Ambulation Distance (Feet): 200 Feet Assistive device: Rolling walker (2 wheeled) Gait Pattern/deviations: Step-through pattern;Decreased stride length   Gait velocity interpretation: Below normal speed for age/gender General Gait Details: Assist for safety due to pt states at times his knees buckle without warning.   Stairs            Wheelchair Mobility    Modified Rankin (Stroke Patients Only)       Balance Overall balance assessment: Needs assistance Sitting-balance support: No upper extremity supported Sitting balance-Leahy Scale: Good     Standing balance support: No upper extremity supported Standing balance-Leahy Scale: Fair                      Cognition Arousal/Alertness: Awake/alert Behavior During Therapy: WFL for tasks assessed/performed Overall Cognitive Status: Within Functional Limits for tasks assessed                      Exercises      General Comments        Pertinent Vitals/Pain      Home Living                       Prior Function            PT Goals (current goals can now be found in the care plan section) Progress towards PT goals: Progressing toward goals    Frequency       PT Plan Current plan remains appropriate    Co-evaluation             End of Session Equipment Utilized During Treatment: Gait belt;Cervical collar;Oxygen Activity Tolerance: Patient tolerated treatment well Patient left: in chair;with call bell/phone within reach     Time: [id number] PT Time Calculation (min) (ACUTE ONLY): 15 min  Charges:  $Gait Training: 8-22 mins                    G Codes:      Elah Avellino 08/08/2014, 9:21 AM  [facility] Khamani Daniely PT [phone]

## 2014-08-08 NOTE — Progress Notes (Signed)
[place] TEAM 1 - Stepdown/ICU TEAM Progress Note  Chad Huffman WUJ:[id number] DOB: 12/31/1945 DOA: 08/04/2014 PCP: Dorothey BasemanBRONSTEIN, [name], MD  Admit HPI / Brief Narrative: 69 y.o. WM PMHx hypertension, carpal tunnel syndrome, psoriasis, AAA repair 2009, hypothyroidism, COPD, C2 to C7 fusion 2012, revision at [facility] 03/2014, and L4-L5 decompression 2013 who was transferred from [facility]. His only complaint was tingling in his neck with minimal pain. Patient presented to the other facility after a fall with complaint of low back pain. EMS also found that he was hypercarbic. It is unclear whether he passed out or had head injury or neck injury. Patient was found to be significantly hypercarbic with acidosis with PCO2 in the 80s pH 7.2, chest x-ray was clear, CT chest PE protocol was negative for pneumonia or pulmonary embolism, EKG was unremarkable. The patient was sent here for further workup, but it is not clear why transfer from [facility] was necessary.  On review of the record he has chronic low back pain, chronic neck pain, chronic right shoulder and right hip pain. The patient follows with orthopedics Dr.Tuohy and spine surgeon Dr Mindi JunkerWesley Hsu (neurosurgery) [phone]    at [facility] and has recently been admitted at [facility] for multiple syncopal episodes w/ workup being unremarkable.   HPI/Subjective: 2/18  A/O 4, on Ventimask to maintain SPO2 in the mid 90s .   Assessment/Plan: Chronic back and neck pain - S/P cervical spinal fusion w/ redo Oct 2015 - S/P lumbar laminectomy -[facility] suggested ?fx of lower most R pedicle screw of posterior fusion, but [facility] indicates intact cervical fusion hardware w/ no evidence of acute traumatic injury  -Patient continues to have some upper extremity weakness will obtain MRI of thoracic spine. To definitively evaluate for hardware destruction. -Spoke with  Dr Mindi JunkerWesley Hsu (neurosurgery)  [phone], a stated that patient has very complicated spinal issues which he has been working on. Reviewed findings of x-ray and C-spine CT and Dr Raynald KempHsu felt no further workup on his spinal issues warranted. Request that patient follow-up as outpatient after syncopal workup complete. -Counseled patient that the ED had attempted to have him transferred on admission to [facility] and they had declined his transfer secondary to having no beds available.  -Patient evaluated by Dr.Ernesto Sandrea MatteM Botero (neurosurgery) [place] who felt no need for any surgical Intervention, -Decrease nortriptyline to 20 mg BID secondary to possible exacerbation of hyponatremia  Acute hypoxic and hypercarbic resp failure due to COPD w/ acute exacerbation/HCAP -Continue current antibiotics -Flutter valve -DuoNeb -Mucinex DM BID -Requiring intermittent BiPAP especially at night. PCCM following w/ us  -OSA with CO2 retention. Patient more interested in being evaluated for OSA.  -ABG in a.m. -Respiratory viral panel negative  Non-anion gap Chronic respiratory acidosis compensated -BiPAP tonight -ABG in a.m. -Sleep study on discharge  Cardiomyopathy? 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 Adrenal insufficiency? Obtain ACTH -Consider TED hose, Midodrine if negative for adrenal insufficiency and positive for orthostatic hypotension  Hyponatremia -Most likely secondary to heart failure, and hypothyroidism. 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 -Decrease Zoloft to 25 mg daily -Decrease nortriptyline to 20 mg BID secondary to possible exacerbation of hyponatremia  Hypothyroidism -Patient's TSH/free  T4 borderline -Increase Synthroid to 125 g daily (patient reported taking 100 g daily)   B LE edema  -Reportedly a chronic issue - mild/modest at present   HTN -Hold all BP medication   Depression -Decrease Zoloft to 25 mg daily secondary to exacerbating hyponatremia    Code Status: FULL Family Communication: no family present at time of exam Disposition Plan: Complete syncope workup, and arrange for outpatient follow-up with Dr Mindi Junker (neurosurgery) [phone],    Consultants: Dr.Ernesto Sandrea Matte (neurosurgery) Dr Mindi Junker (neurosurgery) [phone]; phone consult    Procedure/Significant Events: 2/15 CT head/C-spine without contrast;-No evidence of an acute traumatic intracranial injury -No evidence of acute cervical spine fracture- Intact appearances of the cervical fusion hardware 2/16 PCXR;Mild patchy right base opacity, and right hemidiaphragm elevation  2/16 MRI brain without contrast;No acute stroke.-Scattered chronic lacunar infarcts. Chronic LEFT cerebellar infarct. 2/17 echocardiogram;- Left ventricle:Asynchronous contraction consistent with bundle branch block. mild focal basal hypertrophy of the septum. -LVEF 60%- 65%.  - (grade 1 diastolic dysfunction). - Left atrium: mildly dilated.- Right ventricle: mildly dilated.     Culture 2/16 respiratory virus panel negative 2/16 sputum culture not collected    Antibiotics: Levaquin 2/14>> Vancomycin 2/15>>  DVT prophylaxis: Lovenox   Devices NA   LINES / TUBES:      Continuous Infusions: . sodium chloride 10 mL/hr at 08/04/14 2302    Objective: VITAL SIGNS: Temp: 97.3 F (36.3 C) (02/18 1120) Temp Source: Oral (02/18 1120) BP: 119/54 mmHg (02/18 1120) Pulse Rate: 84 (02/18 1120) SPO2; 95% on 10 L/min FIO2: 40%   Intake/Output Summary (Last 24 hours) at 08/08/14 1323 Last data filed at 08/08/14 0800  Gross per 24 hour  Intake    750 ml  Output   1200 ml  Net   -450 ml      Exam: General: A/O 4, on Ventimask in order to maintain saturations Lungs: Clear to auscultation left lung lobes/right upper lobe. decreased breath sounds right lower lobe. without wheezes or crackles Cardiovascular: Regular rate and rhythm without murmur gallop or rub normal S1 and S2 Abdomen: Nontender, nondistended, soft, bowel sounds positive, no rebound, no ascites, no appreciable mass Extremities: No significant cyanosis, clubbing, or edema bilateral lower extremities Neurologic;   Data Reviewed: Basic Metabolic Panel:  Recent Labs Lab 08/04/14 2100 08/05/14 0959 08/06/14 0210 08/07/14 0243 08/08/14 0253  NA 133* 132* 131* 127* 129*  K 5.5* 5.0 5.0 5.7* 4.8  CL 94* 93* 94* 92* 87*  CO2 31 34* 34* 28 37*  GLUCOSE 136* 148* 140* 160* 118*  BUN 26* 28*  CREATININE 0.98 0.96 1.01 1.13 1.24  CALCIUM 8.6 8.7 8.4 8.5 8.6  MG  --   --  2.5 2.4 2.2  PHOS 4.6  --  3.8  --   --    Liver Function Tests:  Recent Labs Lab 08/04/14 2100 08/05/14 0959 08/07/14 0243 08/08/14 0253  AST 18 19 35 16  ALT ALKPHOS 79 69 54 52  BILITOT 1.5* 1.6* 1.4* 1.0  PROT 7.2 6.2 5.5* 5.2*  ALBUMIN 4.2 3.4* 3.0* 3.0*   No results for input(s): LIPASE, AMYLASE in the last 168 hours. No results for input(s): AMMONIA in the last 168 hours. CBC:  Recent Labs Lab 08/04/14 2100 08/05/14 0915 08/06/14 0210 08/07/14 0243 08/08/14 0253  WBC 7.4 4.0 6.2 7.6 8.3  NEUTROABS 7.1 3.5  --  6.9 6.2  HGB 13.2  11.7* 11.1* 11.9* 12.5*  HCT 42.7 37.2* 35.0* 38.1* 40.1  MCV 93.0 89.0 89.1 89.4 89.7  PLT 180 190 169 184 166   Cardiac Enzymes:  Recent Labs Lab 08/04/14 2100 08/05/14 0224 08/05/14 0959  TROPONINI <0.03 <0.03 <0.03   BNP (last 3 results)  Recent Labs  08/04/14 2100  BNP 112.1*    ProBNP (last 3 results) No results for input(s): PROBNP in the last 8760 hours.  CBG: No results for input(s): GLUCAP in the last 168 hours.  Recent Results (from  the past 240 hour(s))  Respiratory virus panel (routine influenza)     Status: None   Collection Time: 08/06/14  5:07 PM  Result Value Ref Range Status   Respiratory Syncytial Virus A Negative Negative Final   Respiratory Syncytial Virus B Negative Negative Final   Influenza A Negative Negative Final   Influenza B Negative Negative Final   Parainfluenza 1 Negative Negative Final   Parainfluenza 2 Negative Negative Final   Parainfluenza 3 Negative Negative Final   Metapneumovirus Negative Negative Final   Rhinovirus Negative Negative Final   Adenovirus Negative Negative Final    Comment: (NOTE) Performed At: [facility] [address] [place], [place] [id number] Mila Homer MD WU:[id number]      Studies:  Recent x-ray studies have been reviewed in detail by the Attending Physician  Scheduled Meds:  Scheduled Meds: . ALPRAZolam  1 mg Oral BID  . antiseptic oral rinse  7 mL Mouth Rinse q12n4p  . aspirin EC  81 mg Oral Daily  . baclofen  10 mg Oral TID  . budesonide  0.25 mg Nebulization BID  . chlorhexidine  15 mL Mouth Rinse BID  . enoxaparin (LOVENOX) injection  40 mg Subcutaneous Q24H  . ipratropium-albuterol  3 mL Nebulization Q6H WA  . levofloxacin (LEVAQUIN) IV  750 mg Intravenous Q24H  . levothyroxine  125 mcg Oral QAC breakfast  . multivitamin with minerals  1 tablet Oral Daily  . nortriptyline  25 mg Oral BID  . predniSONE  40 mg Oral Q breakfast  . sertraline  50 mg Oral Daily  . sodium chloride  3 mL Intravenous Q12H    Time spent on care of this patient: 40 mins   [facility]  [facility] Office  [phone] Pager - [phone]  On-Call/Text Page:      [email or web address]      password [email or web address]  If 7PM-7AM, please contact night-coverage [email or web address] Password [email or web address] 08/08/2014, 1:23 PM   LOS: 4 days   Care during the described time interval was provided by me .  I have reviewed this patient's available data, including medical  history, events of note, physical examination, radiology studies and test results as part of my evaluation  Carolyne Littles, MD [phone] Pager

## 2014-08-08 NOTE — Progress Notes (Signed)
Paged MD Carolyne Littlesurtis Woods at [phone] about pt requesting to see MD today to know about future plan of care and possibility of discharged date. MD called back at [phone] and stated he is still in hospital making rounds and will see patient tonight and discuss future plans of care. Informed patient that MD will see him tonight.

## 2014-08-09 LAB — BLOOD GAS, ARTERIAL
Acid-Base Excess: 10.1 mmol/L — ABNORMAL HIGH (ref 0.0–2.0)
BICARBONATE: 35.2 meq/L — AB (ref 20.0–24.0)
DRAWN BY: 252031
Delivery systems: POSITIVE
EXPIRATORY PAP: 5
FIO2: 0.3 %
Inspiratory PAP: 10
Mode: POSITIVE
O2 SAT: 97.6 %
PCO2 ART: 57.2 mmHg — AB (ref 35.0–45.0)
PO2 ART: 89.1 mmHg (ref 80.0–100.0)
Patient temperature: 97.4
TCO2: 37.1 mmol/L (ref 0–100)
pH, Arterial: 7.403 (ref 7.350–7.450)

## 2014-08-09 LAB — CBC WITH DIFFERENTIAL/PLATELET
BASOS ABS: 0 10*3/uL (ref 0.0–0.1)
Basophils Relative: 0 % (ref 0–1)
EOS ABS: 0 10*3/uL (ref 0.0–0.7)
EOS PCT: 0 % (ref 0–5)
HEMATOCRIT: 41.7 % (ref 39.0–52.0)
Hemoglobin: 13.1 g/dL (ref 13.0–17.0)
Lymphocytes Relative: 14 % (ref 12–46)
Lymphs Abs: 1.2 10*3/uL (ref 0.7–4.0)
MCH: 28.1 pg (ref 26.0–34.0)
MCHC: 31.4 g/dL (ref 30.0–36.0)
MCV: 89.5 fL (ref 78.0–100.0)
MONO ABS: 1.3 10*3/uL — AB (ref 0.1–1.0)
Monocytes Relative: 15 % — ABNORMAL HIGH (ref 3–12)
Neutro Abs: 6.4 10*3/uL (ref 1.7–7.7)
Neutrophils Relative %: 71 % (ref 43–77)
PLATELETS: 160 10*3/uL (ref 150–400)
RBC: 4.66 MIL/uL (ref 4.22–5.81)
RDW: 15.3 % (ref 11.5–15.5)
WBC: 8.9 10*3/uL (ref 4.0–10.5)

## 2014-08-09 LAB — COMPREHENSIVE METABOLIC PANEL
ALK PHOS: 51 U/L (ref 39–117)
ALT: 10 U/L (ref 0–53)
AST: 15 U/L (ref 0–37)
Albumin: 3 g/dL — ABNORMAL LOW (ref 3.5–5.2)
Anion gap: 7 (ref 5–15)
BILIRUBIN TOTAL: 1.2 mg/dL (ref 0.3–1.2)
BUN: 28 mg/dL — ABNORMAL HIGH (ref 6–23)
CO2: 33 mmol/L — AB (ref 19–32)
Calcium: 8.4 mg/dL (ref 8.4–10.5)
Chloride: 89 mmol/L — ABNORMAL LOW (ref 96–112)
Creatinine, Ser: 1.26 mg/dL (ref 0.50–1.35)
GFR, EST AFRICAN AMERICAN: 67 mL/min — AB (ref >90)
GFR, EST NON AFRICAN AMERICAN: 58 mL/min — AB (ref >90)
GLUCOSE: 79 mg/dL (ref 70–99)
POTASSIUM: 4.6 mmol/L (ref 3.5–5.1)
Sodium: 129 mmol/L — ABNORMAL LOW (ref 135–145)
TOTAL PROTEIN: 5.2 g/dL — AB (ref 6.0–8.3)

## 2014-08-09 LAB — OSMOLALITY: OSMOLALITY: 279 mosm/kg (ref 275–300)

## 2014-08-09 LAB — MAGNESIUM: MAGNESIUM: 2.2 mg/dL (ref 1.5–2.5)

## 2014-08-09 MED ORDER — LEVOFLOXACIN 750 MG PO TABS
750.0000 mg | ORAL_TABLET | Freq: Every day | ORAL | Status: AC
  Administered 2014-08-09 – 2014-08-11 (×3): 750 mg via ORAL
  Filled 2014-08-09 (×3): qty 1

## 2014-08-09 MED ORDER — FUROSEMIDE 20 MG PO TABS
20.0000 mg | ORAL_TABLET | Freq: Every day | ORAL | Status: DC
  Administered 2014-08-09 – 2014-08-10 (×2): 20 mg via ORAL
  Filled 2014-08-09 (×2): qty 1

## 2014-08-09 MED ORDER — POLYETHYLENE GLYCOL 3350 17 G PO PACK
17.0000 g | PACK | Freq: Every day | ORAL | Status: DC
Start: 2014-08-09 — End: 2014-08-13
  Administered 2014-08-09 – 2014-08-12 (×4): 17 g via ORAL
  Filled 2014-08-09 (×5): qty 1

## 2014-08-09 NOTE — Progress Notes (Signed)
[place] TEAM 1 - Stepdown/ICU TEAM Progress Note  Chad LabradorDanny L Huffman UJW:[id number] DOB: Apr 17, 1946 DOA: 08/04/2014 PCP: Dorothey BasemanBRONSTEIN, [name], MD  Admit HPI / Brief Narrative32: 68 y.o. male with history of hypertension, carpal tunnel syndrome, psoriasis, AAA repair 2009, hypothyroidism, COPD, C2 to C7 fusion 2012, revision at [facility] 03/2014, and L4-L5 decompression 2013 who was transferred from [facility].  His only complaint was tingling in his neck with minimal pain.  Patient presented to the other facility after a fall with complaint of low back pain.  EMS also found that he was hypercarbic.  It is unclear whether he passed out or had head injury or neck injury.  Patient was found to be significantly hypercarbic with acidosis with PCO2 in the 80s pH 7.2, chest x-ray was clear, CT chest PE protocol was negative for pneumonia or pulmonary embolism, EKG was unremarkable.  The patient was sent here for further workup, but it is not clear why transfer from [facility] was necessary.  On review of the record he has chronic low back pain, chronic neck pain, chronic right shoulder and right hip pain.  The patient follows with orthopedics Dr.Tuohy and spine surgeon Dr Chad [facility]empHsu at [facility] and has recently been admitted at [facility] for multiple syncopal episodes w/ workup being unremarkable.  HPI/Subjective: Pt has no new complaints today.  He continues to have intermittent tingling and numbness in his hands, but his has not changed.  He denies presyncope or dizziness.  He denies cp or sob.    Assessment/Plan:  Chronic back and neck pain - S/P cervical spinal fusion w/ redo Oct 2015 - S/P lumbar laminectomy Thoracic [facility] at [facility] suggested ?fx of lower most R pedicle screw of posterior fusion, but [facility] at [facility] indicated intact cervical fusion hardware w/ no evidence of acute traumatic injury - the pt felt more comfortable wearing a soft cervical collar - Dr. Joseph ArtWoods spoke withDr Chad JunkerWesley  Huffman at [facility] (Neurosurgery [phone]) who stated that patient has very complicated spinal issues which he has been working on, reviewed findings of x-ray and C-spine CT and Dr Chad KempHsu felt no further workup on his spinal issues warranted abd requested that patient follow-up as outpatient after syncopal workup complete - patient evaluated by Dr.Ernesto Sandrea MatteM Huffman (Neurosurgery) at [facility] who felt no need for any acute surgical intervention - pt remains clinically stable at this time   Acute hypoxic and hypercarbic resp failure due to COPD w/ acute exacerbation  Has now been using BIPAP only at night - PCCM evaluated and signed off, making no specific recommendations for QHS CPAP or BIPAP - I feel a sleep study after d/c is appropriate as pt is felt to be at risk for SA - wean O2 for sat goal of 88-92% - has been tx w/ empiric abx (8 day course), nebs, and steroid taper - remains on 3L [place] at all times, but sats near 100% - will make a concerted effort to wean to RA today - stop BIPAP and follow th/o night w/ continuous sats   ?Syncope  CTa of chest negative for PE at [facility] - suspect this was related to hypercarbia due to COPD - mildly orthostatic by SBP, but not by HR and currently asymptomatic - TTE w/o valvular findings - carotid dopplers June 2014 unremarkable -   Mild hyponatremia  Weight is up 3kg since admit - resume lasix and follow - ?related to chronic lung disease - SSRI has been decreased - checking  adrenal axis   Chronic diastolic CHF - B LE edema  Reportedly a chronic issue - mild/modest at present - weight is up 3kg since admit so will resume home lasix dose  HTN BP currently well controlled - follow w/ resumption of lasix   Hypothyroid TSH above goal at 4.536 - Synthroid increased to  Depression Decreased Zoloft to 25 mg daily secondary to ?exacerbating hyponatremia  Code Status: FULL Family Communication: no family present at time of exam Disposition Plan: transfer to  med bed - attempt to titrate to RA/determine if home O2 indicated   Consultants: PCCM NS  Procedures: 2/17 - TTE - EF 60-65% - no WMA - grade 1 DD  Antibiotics: Vancomycin 2/14 > 2/17 Levaquin 2/14 >  DVT prophylaxis: lovenox   Objective: Blood pressure 127/59, pulse 72, temperature 97.4 F (36.3 C), temperature source Oral, resp. rate 18, height  (1.778 m), weight 113.717 kg (250 lb 11.2 oz), SpO2 99 %.  Intake/Output Summary (Last 24 hours) at 08/09/14 0839 Last data filed at 08/09/14 0300  Gross per 24 hour  Intake    480 ml  Output   1400 ml  Net   -920 ml   Exam: General: no acute resp distress - sitting in bedside chair on 3L [place] w/ sats 99%  Lungs: Clear to auscultation bilaterally without wheezes or focal crackles - distant BS Cardiovascular: Regular rate and rhythm without murmur gallop or rub - distant HS  Abdomen: Nontender, nondistended, soft, bowel sounds positive, no rebound, no ascites, no appreciable mass Extremities: No significant cyanosis, clubbing, or edema bilateral lower extremities Neuro:  5/5 strength B U & LE - intact sensation touch th/o   Data Reviewed: Basic Metabolic Panel:  Recent Labs Lab 08/04/14 2100 08/05/14 0959 08/06/14 0210 08/07/14 0243 08/08/14 0253 08/09/14 0245  NA 133* 132* 131* 127* 129* 129*  K 5.5* 5.0 5.0 5.7* 4.8 4.6  CL 94* 93* 94* 92* 87* 89*  CO2 31 34* 34* 28 37* 33*  GLUCOSE 136* 148* 140* 160* 118* 79  BUN 26* 28* 28*  CREATININE 0.98 0.96 1.01 1.13 1.24 1.26  CALCIUM 8.6 8.7 8.4 8.5 8.6 8.4  MG  --   --  2.5 2.4 2.2 2.2  PHOS 4.6  --  3.8  --   --   --    Liver Function Tests:  Recent Labs Lab 08/04/14 2100 08/05/14 0959 08/07/14 0243 08/08/14 0253 08/09/14 0245  AST 18 19 35 16 15  ALT ALKPHOS 79 69 54 52 51  BILITOT 1.5* 1.6* 1.4* 1.0 1.2  PROT 7.2 6.2 5.5* 5.2* 5.2*  ALBUMIN 4.2 3.4* 3.0* 3.0* 3.0*   Coags:  Recent Labs Lab 08/04/14 2100  INR 1.04     Recent Labs Lab 08/04/14 2100  APTT 44*    CBC:  Recent Labs Lab 08/04/14 2100 08/05/14 0915 08/06/14 0210 08/07/14 0243 08/08/14 0253 08/09/14 0245  WBC 7.4 4.0 6.2 7.6 8.3 8.9  NEUTROABS 7.1 3.5  --  6.9 6.2 6.4  HGB 13.2 11.7* 11.1* 11.9* 12.5* 13.1  HCT 42.7 37.2* 35.0* 38.1* 40.1 41.7  MCV 93.0 89.0 89.1 89.4 89.7 89.5  PLT 180 190 169 184 166 160   Cardiac Enzymes:  Recent Labs Lab 08/04/14 2100 08/05/14 0224 08/05/14 0959  TROPONINI <0.03 <0.03 <0.03   BNP (last 3 results)  Recent Labs  08/04/14 2100  BNP 112.1*   Studies:  Recent x-ray  studies have been reviewed in detail by the Attending Physician  Scheduled Meds:  Scheduled Meds: . ALPRAZolam  1 mg Oral BID  . antiseptic oral rinse  7 mL Mouth Rinse q12n4p  . aspirin EC  81 mg Oral Daily  . baclofen  10 mg Oral TID  . budesonide  0.25 mg Nebulization BID  . chlorhexidine  15 mL Mouth Rinse BID  . enoxaparin (LOVENOX) injection  40 mg Subcutaneous Q24H  . ipratropium-albuterol  3 mL Nebulization Q6H WA  . levofloxacin (LEVAQUIN) IV  750 mg Intravenous Q24H  . levothyroxine  125 mcg Oral QAC breakfast  . multivitamin with minerals  1 tablet Oral Daily  . nortriptyline  20 mg Oral BID  . predniSONE  40 mg Oral Q breakfast  . sertraline  25 mg Oral Daily  . sodium chloride  3 mL Intravenous Q12H    Time spent on care of this patient: 35 mins   Talbot Monarch T , MD   [facility] Office  [phone] Pager - Text Page per Loretha Stapler as per below:  On-Call/Text Page:      [email or web address]      password [email or web address]  If 7PM-7AM, please contact night-coverage [email or web address] Password [facility] 08/09/2014, 8:39 AM   LOS: 5 days

## 2014-08-09 NOTE — Progress Notes (Signed)
Physical Therapy Treatment Patient Details Name: Chad Huffman MRN: [id number] DOB: Dec 18, 1945 Today's Date: 08/09/2014    History of Present Illness Pt adm with acute on chronic hypercarbic respiratory failure and placed on bipap. Pt with ?broken pedicle screw in lower thoracic spine. PMH - multiple spinal surgeries with most recently a cervical surgery, COPD, AAA repair    PT Comments    Pt continues to make steady progress.  Follow Up Recommendations  Home health PT     Equipment Recommendations  None recommended by PT    Recommendations for Other Services       Precautions / Restrictions Precautions Precautions: Fall Required Braces or Orthoses: Cervical Brace Cervical Brace: Soft collar;For comfort Restrictions Weight Bearing Restrictions: No    Mobility  Bed Mobility                  Transfers Overall transfer level: Modified independent   Transfers: Sit to/from Stand Sit to Stand: Modified independent (Device/Increase time)            Ambulation/Gait Ambulation/Gait assistance: Supervision Ambulation Distance (Feet): 250 Feet Assistive device: Rolling walker (2 wheeled) Gait Pattern/deviations: Step-through pattern;Decreased stride length   Gait velocity interpretation: Below normal speed for age/gender General Gait Details: Assist for safety due to pt states at times his knees buckle without warning.   Stairs            Wheelchair Mobility    Modified Rankin (Stroke Patients Only)       Balance   Sitting-balance support: No upper extremity supported Sitting balance-Leahy Scale: Good       Standing balance-Leahy Scale: Fair                      Cognition Arousal/Alertness: Awake/alert Behavior During Therapy: WFL for tasks assessed/performed Overall Cognitive Status: Within Functional Limits for tasks assessed                      Exercises      General Comments        Pertinent Vitals/Pain Pain  Assessment: No/denies pain    Home Living                      Prior Function            PT Goals (current goals can now be found in the care plan section) Progress towards PT goals: Progressing toward goals    Frequency  Min 3X/week    PT Plan Current plan remains appropriate    Co-evaluation             End of Session Equipment Utilized During Treatment: Gait belt;Oxygen Activity Tolerance: Patient tolerated treatment well Patient left: in chair;with call bell/phone within reach     Time: 0951-1007 PT Time Calculation (min) (ACUTE ONLY): 16 min  Charges:  $Gait Training: 8-22 mins                    G Codes:      Chad Huffman 08/09/2014, 10:14 AM  Anniyah Mood PT [phone]

## 2014-08-09 NOTE — Progress Notes (Signed)
Pt complains of left hand stiffness, pt hand was assessed for temperature, color, cap refill and pain.  No abnormalities noted.  RN was notified and a focused assesment was completed.  Pt advised to notify RN if symptoms worsen. /pl SN

## 2014-08-09 NOTE — Progress Notes (Signed)
Oxygen weaned to RA per MD orders: pt c/o SHOB with oxygen sats decreasing to 83% while at rest sitting in chair; O2 reapplied at 2L [place]; will continue to monitor

## 2014-08-09 NOTE — Progress Notes (Signed)
Preparing pt for transfer to 5W; report given to Trinna PostAlex, RN; questions encouraged and answered; pt wants to wait and eat dinner upstairs; pt tray transferred with pt

## 2014-08-10 LAB — BASIC METABOLIC PANEL
Anion gap: 9 (ref 5–15)
BUN: 28 mg/dL — ABNORMAL HIGH (ref 6–23)
CHLORIDE: 89 mmol/L — AB (ref 96–112)
CO2: 31 mmol/L (ref 19–32)
Calcium: 9 mg/dL (ref 8.4–10.5)
Creatinine, Ser: 1.16 mg/dL (ref 0.50–1.35)
GFR calc non Af Amer: 64 mL/min — ABNORMAL LOW (ref >90)
GFR, EST AFRICAN AMERICAN: 74 mL/min — AB (ref >90)
Glucose, Bld: 87 mg/dL (ref 70–99)
Potassium: 5 mmol/L (ref 3.5–5.1)
Sodium: 129 mmol/L — ABNORMAL LOW (ref 135–145)

## 2014-08-10 MED ORDER — BUDESONIDE-FORMOTEROL FUMARATE 160-4.5 MCG/ACT IN AERO
2.0000 | INHALATION_SPRAY | Freq: Two times a day (BID) | RESPIRATORY_TRACT | Status: DC
  Administered 2014-08-10 – 2014-08-13 (×5): 2 via RESPIRATORY_TRACT
  Filled 2014-08-10 (×2): qty 6

## 2014-08-10 MED ORDER — FUROSEMIDE 10 MG/ML IJ SOLN: 40.0000 mg | Freq: Two times a day (BID) | INTRAMUSCULAR | Status: DC

## 2014-08-10 MED ORDER — FUROSEMIDE 10 MG/ML IJ SOLN
40.0000 mg | Freq: Two times a day (BID) | INTRAMUSCULAR | Status: DC
  Administered 2014-08-10 – 2014-08-13 (×6): 40 mg via INTRAVENOUS
  Filled 2014-08-10 (×8): qty 4

## 2014-08-10 NOTE — Progress Notes (Signed)
TEAM 1 - Stepdown/ICU TEAM Progress Note  Chad Huffman ZOX:[id number] DOB: [birth year] DOA: 08/04/2014 PCP: Dorothey Baseman, MD  Admit HPI / Brief Narrative: 69 y.o. male with history of hypertension, carpal tunnel syndrome, psoriasis, AAA repair 2009, hypothyroidism, COPD, C2 to C7 fusion 2012, revision at [facility] 03/2014, and L4-L5 decompression 2013 who was transferred from [facility].  His only complaint was tingling in his neck with minimal pain.  Patient presented to the other facility after a fall with complaint of low back pain.  EMS also found that he was hypercarbic.  It is unclear whether he passed out or had head injury or neck injury.  Patient was found to be significantly hypercarbic with acidosis with PCO2 in the 80s pH 7.2, chest x-ray was clear, CT chest PE protocol was negative for pneumonia or pulmonary embolism, EKG was unremarkable.  The patient was sent here for further workup, but it is not clear why transfer from [facility] was necessary.  On review of the record he has chronic low back pain, chronic neck pain, chronic right shoulder and right hip pain.  The patient follows with orthopedics Dr.Tuohy and spine surgeon Dr Raynald Kemp at [facility] and has recently been admitted at [facility] for multiple syncopal episodes w/ workup being unremarkable.  HPI/Subjective: No new complaints today.  Appears quite dyspneic just sitting in bedside chair.  Denies cp, n/v, abdom pain, or new neurologic complaints.    Assessment/Plan:  Chronic back and neck pain - S/P cervical spinal fusion w/ redo Oct 2015 - S/P lumbar laminectomy Thoracic Xray at [facility] suggested ?fx of lower most R pedicle screw of posterior fusion, but CT cervical spine at [facility] indicated intact cervical fusion hardware w/ no evidence of acute traumatic injury - the pt felt more comfortable wearing a soft cervical collar - Dr. Joseph Art spoke withDr Mindi Junker at [facility] (Neurosurgery [phone]) who  stated that patient has very complicated spinal issues which he has been working on, reviewed findings of x-ray and C-spine CT and Dr Raynald Kemp felt no further workup on his spinal issues warranted abd requested that patient follow-up as outpatient after syncopal workup complete - patient evaluated by Dr.Ernesto Sandrea Matte (Neurosurgery) at [facility] who felt no need for any acute surgical intervention - pt remains clinically stable at this time   Acute hypoxic and hypercarbic resp failure due to severe COPD w/ acute exacerbation  PCCM evaluated and signed off, making no specific recommendations for QHS CPAP or BIPAP - I feel a sleep study after d/c is appropriate as pt is felt to be at risk for SA - attempted to wean O2 for sat goal of 88-92% but pt desaturated into the low 80s on RA very quickly - has been tx w/ empiric abx (8 day course), nebs, and steroid taper - remains on 4L [place] at all times today w/ sats in the low 90s (worse than yesterday) - sats remained >90 % th/o the night with only [place] O2 so will not send home on CPAP at this time - pt admits he has been told before he needs home oxygen   ?Syncope  CTa of chest negative for PE at [facility] - suspect this was related to hypercarbia +/-  hypoxia due to COPD - mildly orthostatic by SBP, but not by HR and currently asymptomatic - TTE w/o valvular findings - carotid dopplers June 2014 unremarkable - no further w/u indicated at this time   Mild hyponatremia  Weight was  up 3kg since admit - resumed lasix - today wgt is back to admit wgt of 110kg, but pt on new floor/new scale - ?related to chronic lung disease - SSRI has been decreased - checking adrenal axis   Chronic diastolic CHF - B LE edema  Reportedly a chronic issue - mild/modest at present - see discussion above - cont diuresis   HTN BP currently reasonably controlled - follow w/o change   Hypothyroid TSH above goal at 4.536 - Synthroid increased to  Depression Decreased Zoloft to 25 mg  daily secondary to ?exacerbating hyponatremia  Code Status: FULL Family Communication: no family present at time of exam Disposition Plan: arrange HHPT and O2 - diurese - adjust nebs - ?home 2/21   Consultants: PCCM NS  Procedures: 2/17 - TTE - EF 60-65% - no WMA - grade 1 DD  Antibiotics: Vancomycin 2/14 > 2/17 Levaquin 2/14 >  DVT prophylaxis: lovenox   Objective: Blood pressure 140/70, pulse 80, temperature 97.7 F (36.5 C), temperature source Oral, resp. rate 19, height  (1.778 m), weight 110.7 kg (244 lb 0.8 oz), SpO2 92 %.  Intake/Output Summary (Last 24 hours) at 08/10/14 1255 Last data filed at 08/10/14 0553  Gross per 24 hour  Intake    100 ml  Output   1230 ml  Net  -1130 ml   Exam: General: no acute resp distress - sitting in bedside chair on 4L [place] w/ sats 90% - desats to 86% when talking  Lungs: poor air movement th/o all fields - no active wheeze - distant BS Cardiovascular: Regular rate and rhythm without murmur gallop or rub - distant HS  Abdomen: Nontender, nondistended, soft, bowel sounds positive, no rebound, no ascites, no appreciable mass Extremities: No significant cyanosis, clubbing, or edema bilateral lower extremities  Data Reviewed: Basic Metabolic Panel:  Recent Labs Lab 08/04/14 2100  08/06/14 0210 08/07/14 0243 08/08/14 0253 08/09/14 0245 08/10/14 0555  NA 133*  < > 131* 127* 129* 129* 129*  K 5.5*  < > 5.0 5.7* 4.8 4.6 5.0  CL 94*  < > 94* 92* 87* 89* 89*  CO2 31  < > 34* 28 37* 33* 31  GLUCOSE 136*  < > 140* 160* 118* 79 87  BUN 16  < > 21 26* 28* 28* 28*  CREATININE 0.98  < > 1.01 1.13 1.24 1.26 1.16  CALCIUM 8.6  < > 8.4 8.5 8.6 8.4 9.0  MG  --   --  2.5 2.4 2.2 2.2  --   PHOS 4.6  --  3.8  --   --   --   --   < > = values in this interval not displayed.   Liver Function Tests:  Recent Labs Lab 08/04/14 2100 08/05/14 0959 08/07/14 0243 08/08/14 0253 08/09/14 0245  AST 18 19 35 16 15  ALT ALKPHOS 79 69 54 52 51  BILITOT 1.5* 1.6* 1.4* 1.0 1.2  PROT 7.2 6.2 5.5* 5.2* 5.2*  ALBUMIN 4.2 3.4* 3.0* 3.0* 3.0*   Coags:  Recent Labs Lab 08/04/14 2100  INR 1.04    Recent Labs Lab 08/04/14 2100  APTT 44*    CBC:  Recent Labs Lab 08/04/14 2100 08/05/14 0915 08/06/14 0210 08/07/14 0243 08/08/14 0253 08/09/14 0245  WBC 7.4 4.0 6.2 7.6 8.3 8.9  NEUTROABS 7.1 3.5  --  6.9 6.2 6.4  HGB 13.2 11.7* 11.1* 11.9* 12.5* 13.1  HCT 42.7  37.2* 35.0* 38.1* 40.1 41.7  MCV 93.0 89.0 89.1 89.4 89.7 89.5  PLT 180 190 169 184 166 160   Cardiac Enzymes:  Recent Labs Lab 08/04/14 2100 08/05/14 0224 08/05/14 0959  TROPONINI <0.03 <0.03 <0.03   BNP (last 3 results)  Recent Labs  08/04/14 2100  BNP 112.1*   Studies:  Recent x-ray studies have been reviewed in detail by the Attending Physician  Scheduled Meds:  Scheduled Meds: . ALPRAZolam  1 mg Oral BID  . antiseptic oral rinse  7 mL Mouth Rinse q12n4p  . aspirin EC  81 mg Oral Daily  . baclofen  10 mg Oral TID  . budesonide  0.25 mg Nebulization BID  . chlorhexidine  15 mL Mouth Rinse BID  . enoxaparin (LOVENOX) injection  40 mg Subcutaneous Q24H  . furosemide  20 mg Oral Daily  . ipratropium-albuterol  3 mL Nebulization Q6H WA  . levofloxacin  750 mg Oral Daily  . levothyroxine  125 mcg Oral QAC breakfast  . multivitamin with minerals  1 tablet Oral Daily  . nortriptyline  20 mg Oral BID  . polyethylene glycol  17 g Oral Daily  . sertraline  25 mg Oral Daily  . sodium chloride  3 mL Intravenous Q12H    Time spent on care of this patient: 35 mins   Dozier Berkovich T , MD   [facility] Office  [phone] Pager - Text Page per Loretha StaplerAmion as per below:  On-Call/Text Page:      [email or web address]      password [email or web address]  If 7PM-7AM, please contact night-coverage [email or web address] Password [email or web address] 08/10/2014, 12:55 PM   LOS: 6 days

## 2014-08-11 ENCOUNTER — Inpatient Hospital Stay (HOSPITAL_COMMUNITY): Payer: Medicare Other

## 2014-08-11 LAB — BASIC METABOLIC PANEL
ANION GAP: 4 — AB (ref 5–15)
BUN: 23 mg/dL (ref 6–23)
CALCIUM: 8.6 mg/dL (ref 8.4–10.5)
CHLORIDE: 85 mmol/L — AB (ref 96–112)
CO2: 41 mmol/L (ref 19–32)
Creatinine, Ser: 1.25 mg/dL (ref 0.50–1.35)
GFR calc Af Amer: 68 mL/min — ABNORMAL LOW (ref >90)
GFR, EST NON AFRICAN AMERICAN: 58 mL/min — AB (ref >90)
GLUCOSE: 94 mg/dL (ref 70–99)
Potassium: 3.9 mmol/L (ref 3.5–5.1)
Sodium: 130 mmol/L — ABNORMAL LOW (ref 135–145)

## 2014-08-11 NOTE — Progress Notes (Addendum)
CRITICAL VALUE ALERT  Critical value received:  CO2 41    Date of notification:  08/11/14   Time of notification:  0749   Critical value read back:Yes.    Nurse who received alert:  Fletcher Ostermiller   MD notified (1st page):  Dr. Sharon SellerMcClung  Time of first page:  0750  MD notified (2nd page):  Time of second page:  Responding MD:  Dr. Sharon SellerMcClung   Time MD responded:  [phone]

## 2014-08-11 NOTE — Progress Notes (Signed)
[facility] TEAM 1 - Stepdown/ICU TEAM Progress Note  Illene LabradorDanny L Collett ZOX:[id number] DOB: 1946/05/22 DOA: 08/04/2014 PCP: Dorothey BasemanBRONSTEIN, [name], MD  Admit HPI / Brief Narrative71: 68 y.o. male with history of hypertension, carpal tunnel syndrome, psoriasis, AAA repair 2009, hypothyroidism, COPD, C2 to C7 fusion 2012, [facility], and L4-L5 decompression 2013 who was transferred from [facility].  His only complaint was tingling in his neck with minimal pain.  Patient presented to the other facility after a fall with complaint of low back pain.  EMS also found that he was hypercarbic.  It is unclear whether he passed out or had head injury or neck injury.  Patient was found to be significantly hypercarbic with acidosis with PCO2 in the 80s pH 7.2, chest x-ray was clear, CT chest PE protocol was negative for pneumonia or pulmonary embolism, EKG was unremarkable.  The patient was sent here for further workup, but it is not clear why [facility].  On review of the record he has chronic low back pain, chronic neck pain, chronic right shoulder and right hip pain.  The patient follows with orthopedics Dr.Tuohy and spine surgeon Dr Raynald [facility]empHsu [facility] and has [facility] been [facility] at [facility] for multiple syncopal episodes w/ workup being unremarkable.  HPI/Subjective: Pt has actually improved significantly over night w/ attempts to diurese, and is not keeping sats >88% when sitting still on RA.  He continues to desaturate w/ exertion.  He denies cp, n/v, or abdom pain.    Assessment/Plan:  Chronic back and neck pain - S/P cervical spinal fusion w/ redo Oct 2015 - S/P lumbar laminectomy [facility] suggested ?fx of lower most R pedicle screw of posterior fusion, but [facility] indicated intact cervical fusion hardware w/ no evidence of acute traumatic injury - the pt felt more comfortable wearing a soft cervical collar - Dr. Joseph ArtWoods  spoke withDr Mindi JunkerWesley Hsu at [facility] (Neurosurgery [phone]) who stated that patient has very complicated spinal issues which he has been working on, reviewed findings of x-ray and C-spine CT and Dr Raynald KempHsu felt no further workup on his spinal issues warranted abd requested that patient follow-up as outpatient after syncopal workup complete - patient evaluated by Dr.Ernesto Sandrea MatteM Botero (Neurosurgery) at [facility] who felt no need for any acute surgical intervention - pt remains clinically stable at this time   Acute hypoxic and hypercarbic resp failure due to severe COPD w/ acute exacerbation  PCCM evaluated and signed off, making no specific recommendations for QHS CPAP or BIPAP - I feel a sleep study after d/c is appropriate as pt is felt to be at risk for SA - initially attempted to wean O2 for sat goal of 88-92% but pt desaturated into the low 80s on RA very quickly - has been tx w/ empiric abx (8 day course), nebs, and steroid taper - sats remained >90 % th/o the night with only [place] O2 so will not send home on CPAP at this time - pt admits he has been told before he needs home oxygen - will cont w/ diuresis and reevaluate O2 needs in AM prior to planned d/c   ?Syncope  [facility] - suspect this was related to hypercarbia +/-  hypoxia due to COPD - mildly orthostatic by SBP, but not by HR and currently asymptomatic - TTE w/o valvular findings - carotid dopplers June 2014 unremarkable - no further  w/u indicated at this time   Mild hyponatremia  Weight was up 3kg since admit - resumed lasix - ?related to chronic lung disease - SSRI has been decreased - checking adrenal axis   Chronic diastolic CHF - B LE edema  Reportedly a chronic issue - O2 needs have improved w/ increased diuretic - cont and follow Is/Os - wgt not accurate in that pt has moved to a different floor/scale - may be limited in extent of diuresis by BP   HTN Mildly hypotensive at times today - cont in attempts to  diurese and follow   Hypothyroid TSH above goal at 4.536 - Synthroid increased to  Depression Decreased Zoloft to 25 mg daily secondary to ?exacerbating hyponatremia  Code Status: FULL Family Communication: no family present at time of exam Disposition Plan: arrange HHPT and O2 - diurese - adjust nebs - ?home 2/22   Consultants: PCCM NS  Procedures: 2/17 - TTE - EF 60-65% - no WMA - grade 1 DD  Antibiotics: Vancomycin 2/14 > 2/17 Levaquin 2/14 > 2/21  DVT prophylaxis: lovenox   Objective: Blood pressure 95/60, pulse 84, temperature 98.7 F (37.1 C), temperature source Oral, resp. rate 20, height  (1.778 m), weight 110.7 kg (244 lb 0.8 oz), SpO2 95 %.  Intake/Output Summary (Last 24 hours) at 08/11/14 1558 Last data filed at 08/11/14 1430  Gross per 24 hour  Intake    740 ml  Output   4320 ml  Net  -3580 ml   Exam: General: no acute resp distress - sitting in bedside chair on RA w/ sats 90-92% Lungs: poor air movement th/o all fields - no active wheeze - distant BS Cardiovascular: Regular rate and rhythm without murmur gallop or rub - distant HS  Abdomen: Nontender, nondistended, soft, bowel sounds positive, no rebound, no ascites, no appreciable mass Extremities: No significant cyanosis, clubbing;  Trace edema bilateral lower extremities  Data Reviewed: Basic Metabolic Panel:  Recent Labs Lab 08/04/14 2100  08/06/14 0210 08/07/14 0243 08/08/14 0253 08/09/14 0245 08/10/14 0555 08/11/14 0615  NA 133*  < > 131* 127* 129* 129* 129* 130*  K 5.5*  < > 5.0 5.7* 4.8 4.6 5.0 3.9  CL 94*  < > 94* 92* 87* 89* 89* 85*  CO2 31  < > 34* 28 37* 33* 31 41*  GLUCOSE 136*  < > 140* 160* 118* 79 87 94  BUN 16  < > 21 26* 28* 28* 28* 23  CREATININE 0.98  < > 1.01 1.13 1.24 1.26 1.16 1.25  CALCIUM 8.6  < > 8.4 8.5 8.6 8.4 9.0 8.6  MG  --   --  2.5 2.4 2.2 2.2  --   --   PHOS 4.6  --  3.8  --   --   --   --   --   < > = values in this interval not displayed.     Liver Function Tests:  Recent Labs Lab 08/04/14 2100 08/05/14 0959 08/07/14 0243 08/08/14 0253 08/09/14 0245  AST 18 19 35 16 15  ALT ALKPHOS 79 69 54 52 51  BILITOT 1.5* 1.6* 1.4* 1.0 1.2  PROT 7.2 6.2 5.5* 5.2* 5.2*  ALBUMIN 4.2 3.4* 3.0* 3.0* 3.0*   Coags:  Recent Labs Lab 08/04/14 2100  INR 1.04    Recent Labs Lab 08/04/14 2100  APTT 44*    CBC:  Recent Labs Lab 08/04/14 2100 08/05/14 0915 08/06/14  0210 08/07/14 0243 08/08/14 0253 08/09/14 0245  WBC 7.4 4.0 6.2 7.6 8.3 8.9  NEUTROABS 7.1 3.5  --  6.9 6.2 6.4  HGB 13.2 11.7* 11.1* 11.9* 12.5* 13.1  HCT 42.7 37.2* 35.0* 38.1* 40.1 41.7  MCV 93.0 89.0 89.1 89.4 89.7 89.5  PLT 180 190 169 184 166 160   Cardiac Enzymes:  Recent Labs Lab 08/04/14 2100 08/05/14 0224 08/05/14 0959  TROPONINI <0.03 <0.03 <0.03   BNP (last 3 results)  Recent Labs  08/04/14 2100  BNP 112.1*   Studies:  Recent x-ray studies have been reviewed in detail by the Attending Physician  Scheduled Meds:  Scheduled Meds: . ALPRAZolam  1 mg Oral BID  . antiseptic oral rinse  7 mL Mouth Rinse q12n4p  . aspirin EC  81 mg Oral Daily  . baclofen  10 mg Oral TID  . budesonide-formoterol  2 puff Inhalation BID  . chlorhexidine  15 mL Mouth Rinse BID  . enoxaparin (LOVENOX) injection  40 mg Subcutaneous Q24H  . furosemide  40 mg Intravenous BID  . ipratropium-albuterol  3 mL Nebulization Q6H WA  . levothyroxine  125 mcg Oral QAC breakfast  . multivitamin with minerals  1 tablet Oral Daily  . nortriptyline  20 mg Oral BID  . polyethylene glycol  17 g Oral Daily  . sertraline  25 mg Oral Daily  . sodium chloride  3 mL Intravenous Q12H    Time spent on care of this patient: 35 mins   Yarenis Cerino T , MD   [facility] Office  [phone] Pager - Text Page per Loretha Stapler as per below:  On-Call/Text Page:      [email or web address]      password [email or web address]  If 7PM-7AM, please contact  night-coverage [email or web address] Password [facility] 08/11/2014, 3:58 PM   LOS: 7 days

## 2014-08-12 ENCOUNTER — Encounter (HOSPITAL_COMMUNITY): Payer: Self-pay | Admitting: *Deleted

## 2014-08-12 LAB — BASIC METABOLIC PANEL
Anion gap: 4 — ABNORMAL LOW (ref 5–15)
BUN: 24 mg/dL — AB (ref 6–23)
CALCIUM: 8.5 mg/dL (ref 8.4–10.5)
CO2: 38 mmol/L — AB (ref 19–32)
Chloride: 85 mmol/L — ABNORMAL LOW (ref 96–112)
Creatinine, Ser: 1.26 mg/dL (ref 0.50–1.35)
GFR calc Af Amer: 67 mL/min — ABNORMAL LOW (ref >90)
GFR, EST NON AFRICAN AMERICAN: 58 mL/min — AB (ref >90)
Glucose, Bld: 107 mg/dL — ABNORMAL HIGH (ref 70–99)
POTASSIUM: 3.9 mmol/L (ref 3.5–5.1)
Sodium: 127 mmol/L — ABNORMAL LOW (ref 135–145)

## 2014-08-12 LAB — ACTH: C206 ACTH: 51.4 pg/mL (ref 7.2–63.3)

## 2014-08-12 MED ORDER — FUROSEMIDE 40 MG PO TABS: 40.0000 mg | ORAL_TABLET | Freq: Two times a day (BID) | ORAL | Status: DC

## 2014-08-12 MED ORDER — LEVOTHYROXINE SODIUM 125 MCG PO TABS: 125.0000 ug | ORAL_TABLET | Freq: Every day | ORAL | Status: DC

## 2014-08-12 MED ORDER — IPRATROPIUM-ALBUTEROL 0.5-2.5 (3) MG/3ML IN SOLN: 3.0000 mL | Freq: Four times a day (QID) | RESPIRATORY_TRACT | Status: DC | PRN

## 2014-08-12 MED ORDER — IPRATROPIUM-ALBUTEROL 0.5-2.5 (3) MG/3ML IN SOLN: 3.0000 mL | Freq: Four times a day (QID) | RESPIRATORY_TRACT | Status: DC

## 2014-08-12 MED ORDER — IPRATROPIUM-ALBUTEROL 20-100 MCG/ACT IN AERS: 1.0000 | INHALATION_SPRAY | Freq: Four times a day (QID) | RESPIRATORY_TRACT | Status: DC

## 2014-08-12 NOTE — Progress Notes (Signed)
Pt. Was to be D/C'd today, but pt. Brother was unable to pick him. [facility] and informed them that pt. Would be home tomorrow morning. Pt. Is stable. Pt. Brother made aware of plans and will be able to pick up pt. Tomorrow morning.

## 2014-08-12 NOTE — Discharge Summary (Addendum)
DISCHARGE SUMMARY  Chad Huffman  MR#: [id number]  DOB:1945-12-11  Date of Admission: 08/04/2014 Date of Discharge: 08/12/2014  Attending Physician:MCCLUNG,JEFFREY T  Patient's WUJ:[id number], [name], MD  Consults: Neurosurgery PCCM  Disposition: D/C home   Follow-up Appts:     Follow-up Information    Follow up with Chad Huffman, [name], MD. Schedule an appointment as soon as possible for a visit in 1 week.   Specialty:  Family Medicine   Contact information:   [address] [place] [address] [phone]       Follow up with Chad Junker, MD. Schedule an appointment as soon as possible for a visit in 1 week.   Specialty:  Neurosurgery   Contact information:   [facility] [place] [address] [phone]       Tests Needing Follow-up: -consider arranging an outpt sleep study as pt is felt to be at risk for sleep apnea -close monitoring of volume status w/ probable need to titrate diuretic further  -monitoring of electrolytes and renal fxn on diuretic tx  Discharge Diagnoses: Chronic back and neck pain - S/P cervical spinal fusion w/ redo Oct 2015 - S/P lumbar laminectomy Acute hypoxic and hypercarbic resp failure due to severe COPD w/ acute exacerbation  ?Syncope  Mild hyponatremia  Chronic diastolic CHF - B LE edema  HTN Hypothyroid Depression  Initial presentation: 69 y.o. male with history of hypertension, carpal tunnel syndrome, psoriasis, AAA repair 2009, hypothyroidism, COPD, C2 to C7 fusion 2012, revision at [facility] 03/2014, and L4-L5 decompression 2013 who was transferred from [facility]. His only complaint was tingling in his neck with minimal pain. Patient presented to the other facility after a fall with complaint of low back pain. EMS also found that he was hypercarbic. It is unclear whether he passed out or had head injury or neck injury. Patient was found to be significantly hypercarbic with acidosis with PCO2 in the  80s pH 7.2, chest x-ray was clear, CT chest PE protocol was negative for pneumonia or pulmonary embolism, EKG was unremarkable. The patient was sent here for further workup, but it is not clear why transfer from [facility] was necessary.  On review of the record he has chronic low back pain, chronic neck pain, chronic right shoulder and right hip pain. The patient follows with orthopedics Chad Huffman and spine surgeon Chad Huffman at [facility] and has recently been admitted at [facility] for multiple syncopal episodes w/ workup being unremarkable.  Hospital Course:  Chronic back and neck pain - S/P cervical spinal fusion w/ redo Oct 2015 - S/P lumbar laminectomy Thoracic Xray at [facility] suggested ?fx of lower most R pedicle screw of posterior fusion, but CT cervical spine at [facility] indicated intact cervical fusion hardware w/ no evidence of acute traumatic injury - the pt felt more comfortable wearing a soft cervical collar initially - Chad. Joseph Huffman spoke withDr Chad Huffman at [facility] (Neurosurgery [phone]) who stated that patient has very complicated spinal issues which he has been working on, reviewed findings of x-ray and C-spine CT and Chad Huffman felt no further workup on his spinal issues warranted and requested that patient follow-up as outpatient after syncopal workup complete - patient evaluated by Chad.Ernesto Sandrea Huffman (Neurosurgery) at [facility] who felt no need for any acute surgical intervention - pt remains clinically stable at time of d/c and will call Chad. Denyse Huffman clinic to arrange for a f/u clinic visit  Acute hypoxic and hypercarbic resp failure due to severe COPD  w/ acute exacerbation  PCCM evaluated and signed off, making no specific recommendations for QHS CPAP or BIPAP - I feel a sleep study after d/c is appropriate as pt is felt to be at risk for SA - initially attempted to wean O2 for sat goal of 88-92% but pt desaturated into the low 80s on RA very quickly - has been tx w/ empiric abx (8 day course),  nebs, and steroid taper - sats remained >90 % th/o the night with only [place] O2 so will not send home on CPAP at this time - pt admits he has been told before he needs home oxygen - w/ diuresis, the pt improved to the point that his sats were consistently >88% on RA when sitting still - no the day of his d/c, however, he continued to desaturate as low as 76% w/ ambualtion, requiring 4LNC to keep sats 88% or better - therefore he is prescribed 4L [place] O2 to be worn at all times when he is exerting himself - he was warned that failure to do so would lead to further lung damage, possible heart damage, and even death - compliance likely to be a challenge as pt is complaining about the need to wear O2 and the need to carry a tank with him at time of d/c   ?Syncope  [facility] - suspect this was related to hypercarbia +/- hypoxia due to COPD - mildly orthostatic by SBP, but not by HR and remained asymptomatic th/o his hospital stay  - TTE w/o valvular findings - carotid dopplers June 2014 unremarkable - no further w/u indicated at this time   Mild hyponatremia  Weight trended up 3kg since admit - resumed lasix - ?related to chronic lung disease - SSRI has been decreased - ACTH pending at time of d/c   Chronic diastolic CHF - B LE edema  Reportedly a chronic issue - O2 needs have improved w/ increased diuretic - wgt not accurate in that pt has moved to a different floor/scale - may be limited in extent of diuresis by BP - to cont at higher lasix dose at time of d/c - connection between volume control and pulmonary edema/hypoxia explained to pt in detail    HTN Mildly hypotensive at times but tolerating current diuresis w/o symptoms otherwise    Hypothyroid TSH above goal at 4.536 - Synthroid increased to 125mcg  Depression Decreased Zoloft to 25 mg daily secondary to ?exacerbating hyponatremia    Medication List    STOP taking these medications        losartan 50 MG tablet    Commonly known as:  COZAAR      TAKE these medications        ALPRAZolam 0.5 MG tablet  Commonly known as:  XANAX  Take 0.5-0.75 mg by mouth 2 (two) times daily as needed for anxiety.     aspirin 81 MG tablet  Take 81 mg by mouth daily.     baclofen 20 MG tablet  Commonly known as:  LIORESAL  Take 20-30 mg by mouth every 3 (three) hours as needed for muscle spasms.     desonide 0.05 % cream  Commonly known as:  DESOWEN  Apply 0.05 % topically as needed.     furosemide 40 MG tablet  Commonly known as:  LASIX  Take 1 tablet (40 mg total) by mouth 2 (two) times daily.     ibuprofen 200 MG tablet  Commonly known  as:  ADVIL,MOTRIN  Take 400 mg by mouth every 6 (six) hours as needed.     ipratropium-albuterol 0.5-2.5 (3) MG/3ML Soln  Commonly known as:  DUONEB  Take 3 mLs by nebulization every 6 (six) hours.     Ipratropium-Albuterol 20-100 MCG/ACT Aers respimat  Commonly known as:  COMBIVENT RESPIMAT  Inhale 1 puff into the lungs every 6 (six) hours.     ketoconazole 2 % shampoo  Commonly known as:  NIZORAL     KRILL OIL PO  Take by mouth daily.     levothyroxine 125 MCG tablet  Commonly known as:  SYNTHROID, LEVOTHROID  Take 1 tablet (125 mcg total) by mouth daily before breakfast.     magnesium hydroxide 400 MG/5ML suspension  Commonly known as:  MILK OF MAGNESIA  Take by mouth daily as needed for mild constipation.     multivitamin tablet  Take 1 tablet by mouth daily.     OVER THE COUNTER MEDICATION  Place 2 drops into both eyes daily.     oxycodone 5 MG capsule  Commonly known as:  OXY-IR  Take 5 mg by mouth every 4 (four) hours as needed for pain.     polyethylene glycol packet  Commonly known as:  MIRALAX / GLYCOLAX  Take 17 g by mouth daily as needed.     promethazine 12.5 MG tablet  Commonly known as:  PHENERGAN  Take 2 tablets (25 mg total) by mouth every 6 (six) hours as needed for nausea.     SYMBICORT 160-4.5 MCG/ACT inhaler  Generic  drug:  budesonide-formoterol  Inhale 2 puffs into the lungs 2 (two) times daily.     vitamin B-12 1000 MCG tablet  Commonly known as:  CYANOCOBALAMIN  Take 1,000 mcg by mouth every 30 (thirty) days.        Day of Discharge BP 110/62 mmHg  Pulse 93  Temp(Src) 96.3 F (35.7 C) (Axillary)  Resp 16  Ht  (1.778 m)  Wt 110.7 kg (244 lb 0.8 oz)  BMI 35.02 kg/m2  SpO2 97%  Physical Exam: General: No acute respiratory distress at rest in bedside chair  Lungs: Clear to auscultation bilaterally without wheezes or crackles Cardiovascular: Regular rate and rhythm without murmur gallop or rub normal S1 and S2 Abdomen: Nontender, nondistended, soft, bowel sounds positive, no rebound, no ascites, no appreciable mass Extremities: No significant cyanosis, clubbing;  Trace edema bilateral lower extremities  Basic Metabolic Panel:  Recent Labs Lab 08/06/14 0210 08/07/14 0243 08/08/14 0253 08/09/14 0245 08/10/14 0555 08/11/14 0615 08/12/14 0742  NA 131* 127* 129* 129* 129* 130* 127*  K 5.0 5.7* 4.8 4.6 5.0 3.9 3.9  CL 94* 92* 87* 89* 89* 85* 85*  CO2 34* 28 37* 33* 31 41* 38*  GLUCOSE 140* 160* 118* 79 87 94 107*  BUN 21 26* 28* 28* 28* 23 24*  CREATININE 1.01 1.13 1.24 1.26 1.16 1.25 1.26  CALCIUM 8.4 8.5 8.6 8.4 9.0 8.6 8.5  MG 2.5 2.4 2.2 2.2  --   --   --   PHOS 3.8  --   --   --   --   --   --     Liver Function Tests:  Recent Labs Lab 08/07/14 0243 08/08/14 0253 08/09/14 0245  AST 35 16 15  ALT ALKPHOS 54 52 51  BILITOT 1.4* 1.0 1.2  PROT 5.5* 5.2* 5.2*  ALBUMIN 3.0* 3.0* 3.0*   CBC:  Recent Labs  Lab 08/06/14 0210 08/07/14 0243 08/08/14 0253 08/09/14 0245  WBC 6.2 7.6 8.3 8.9  NEUTROABS  --  6.9 6.2 6.4  HGB 11.1* 11.9* 12.5* 13.1  HCT 35.0* 38.1* 40.1 41.7  MCV 89.1 89.4 89.7 89.5  PLT 169 184 166 160    Recent Results (from the past 240 hour(s))  Respiratory virus panel (routine influenza)     Status: None   Collection Time:  08/06/14  5:07 PM  Result Value Ref Range Status   Respiratory Syncytial Virus A Negative Negative Final   Respiratory Syncytial Virus B Negative Negative Final   Influenza A Negative Negative Final   Influenza B Negative Negative Final   Parainfluenza 1 Negative Negative Final   Parainfluenza 2 Negative Negative Final   Parainfluenza 3 Negative Negative Final   Metapneumovirus Negative Negative Final   Rhinovirus Negative Negative Final   Adenovirus Negative Negative Final    Comment: (NOTE) Performed At: [facility] [address] [place], [place] [id number] Mila Homer MD WG:[id number]       Time spent in discharge (includes decision making & examination of pt): >35 minutes  08/12/2014, 2:14 PM   Lonia Blood, MD [facility] Office  [phone] Pager [phone]  On-Call/Text Page:      [email or web address]      password [facility]

## 2014-08-12 NOTE — Progress Notes (Signed)
Physical Therapy Treatment Patient Details Name: Chad Huffman MRN: [id number] DOB: 12/18/1945 Today's Date: 08/12/2014    History of Present Illness Pt adm with acute on chronic hypercarbic respiratory failure and placed on bipap. Pt with ?broken pedicle screw in lower thoracic spine. PMH - multiple spinal surgeries with most recently a cervical surgery, COPD, AAA repair    PT Comments    Patient able to perform seated exercises with O2 sats remaining at/above 93%.  Encouraged patient to continue these at home.  Had been using incentive spirometer - says he thinks it is helping.  Patient to receive HHPT at discharge.   Follow Up Recommendations  Home health PT     Equipment Recommendations  None recommended by PT    Recommendations for Other Services       Precautions / Restrictions Precautions Precautions: Fall Precaution Comments: oxygen saturation Restrictions Weight Bearing Restrictions: No    Mobility  Bed Mobility               General bed mobility comments: pt in chair  Transfers Overall transfer level: Needs assistance Equipment used: Rolling walker (2 wheeled) Transfers: Sit to/from Stand Sit to Stand: Min guard         General transfer comment: Declined mobility - decreased O2 sats with OT.  Agreed to exercises.  Ambulation/Gait                 Stairs            Wheelchair Mobility    Modified Rankin (Stroke Patients Only)       Balance                                    Cognition Arousal/Alertness: Awake/alert Behavior During Therapy: WFL for tasks assessed/performed Overall Cognitive Status: Within Functional Limits for tasks assessed                      Exercises General Exercises - Upper Extremity Shoulder Flexion: AROM;Right;Left;10 reps;Seated Elbow Flexion: AROM;Right;Left;10 reps;Seated General Exercises - Lower Extremity Long Arc Quad: AROM;Both;10 reps;Seated Hip  ABduction/ADduction: AROM;Both;10 reps;Seated Hip Flexion/Marching: AROM;Both;10 reps;Seated Toe Raises: AROM;Both;10 reps;Seated Heel Raises: AROM;Both;10 reps;Seated    General Comments        Pertinent Vitals/Pain Pain Assessment: No/denies pain    Home Living Family/patient expects to be discharged to:: Private residence Living Arrangements: Alone   Type of Home: House Home Access: Stairs to enter Entrance Stairs-Rails: Right Home Layout: One level Home Equipment: Environmental consultant - 2 wheels;Cane - single point      Prior Function Level of Independence: Independent with assistive device(s)      Comments: used cane primarly   PT Goals (current goals can now be found in the care plan section) Progress towards PT goals: Progressing toward goals    Frequency  Min 3X/week    PT Plan Current plan remains appropriate    Co-evaluation             End of Session Equipment Utilized During Treatment: Oxygen Activity Tolerance: Patient tolerated treatment well Patient left: in chair;with call bell/phone within reach     Time: 4010-2725 PT Time Calculation (min) (ACUTE ONLY): 20 min  Charges:  $Therapeutic Exercise: 8-22 mins                    G Codes:      Beaulah Corin  H 08/12/2014, 6:22 PM Durenda HurtSusan H. Renaldo Fiddleravis, PT, [facility] Acute Rehab Services Pager [phone]

## 2014-08-12 NOTE — Evaluation (Signed)
Occupational Therapy Evaluation Patient Details Name: Chad Huffman MRN: [id number] DOB: Sep 28, 1945 Today's Date: 08/12/2014    History of Present Illness Pt adm with acute on chronic hypercarbic respiratory failure and placed on bipap. Pt with ?broken pedicle screw in lower thoracic spine. PMH - multiple spinal surgeries with most recently a cervical surgery, COPD, AAA repair   Clinical Impression   Pt admitted with RF. Pt currently with functional limitations due to the deficits listed below (see OT Problem List).  Pt will benefit from skilled OT to increase their safety and independence with ADL and functional mobility for ADL to facilitate discharge to venue listed below.     Follow Up Recommendations  Home health OT;SNF;Supervision - Intermittent    Equipment Recommendations  None recommended by OT    Recommendations for Other Services       Precautions / Restrictions Precautions Precautions: Fall Precaution Comments: oxygen saturation      Mobility Bed Mobility               General bed mobility comments: pt in chair  Transfers Overall transfer level: Needs assistance Equipment used: Rolling walker (2 wheeled) Transfers: Sit to/from Stand Sit to Stand: Min guard                   ADL Overall ADL's : Needs assistance/impaired                 Upper Body Dressing : Sitting;Set up   Lower Body Dressing: Minimal assistance;Sit to/from stand   Toilet Transfer: Minimal Production designer, theatre/television/filmassistance;RW Toilet Transfer Details (indicate cue type and reason): bed to chair Toileting- Clothing Manipulation and Hygiene: Min guard;Sit to/from stand         General ADL Comments: oxygen sats drop very quickly to 78 - 79 with activity with 2L of oxygen.. Rn aware- also spoke with pt and PT. Py may need ST SNF                Extremity/Trunk Assessment Upper Extremity Assessment Upper Extremity Assessment: RUE deficits/detail RUE Deficits / Details: Very limited  movement from shoulder surgery a few years ago           Communication Communication Communication: No difficulties   Cognition                           General Comments   oxygen sats dropping very limiting to pt       Shoulder Instructions      Home Living Family/patient expects to be discharged to:: Private residence Living Arrangements: Alone   Type of Home: House Home Access: Stairs to enter Secretary/administratorntrance Stairs-Number of Steps: 4 Entrance Stairs-Rails: Right Home Layout: One level     Bathroom Shower/Tub: Chief Strategy OfficerTub/shower unit   Bathroom Toilet: Standard     Home Equipment: Environmental consultantWalker - 2 wheels;Cane - single point          Prior Functioning/Environment Level of Independence: Independent with assistive device(s)        Comments: used cane primarly    OT Diagnosis: Generalized weakness   OT Problem List: Decreased strength;Decreased activity tolerance;Cardiopulmonary status limiting activity;Decreased safety awareness   OT Treatment/Interventions: Self-care/ADL training;Patient/family education;DME and/or AE instruction;Energy conservation    OT Goals(Current goals can be found in the care plan section) Acute Rehab OT Goals OT Goal Formulation: With patient Time For Goal Achievement: 08/26/14 Potential to Achieve Goals: Good  OT Frequency: Min 2X/week   Barriers  to D/C:               End of Session    Activity Tolerance: Patient tolerated treatment well Patient left: in chair;with call bell/phone within reach   Time: 1610-9604 OT Time Calculation (min): 24 min Charges:  OT General Charges $OT Visit: 1 Procedure OT Evaluation $Initial OT Evaluation Tier I: 1 Procedure OT Treatments $Self Care/Home Management : 8-22 mins G-Codes:    Einar Crow D 09/11/2014, 2:58 PM

## 2014-08-12 NOTE — Progress Notes (Signed)
SATURATION QUALIFICATIONS: (This note is used to comply with regulatory documentation for home oxygen)  Patient Saturations on Room Air at Rest = 98*%  Patient Saturations on Room Air while Ambulating = 76%  Patient Saturations on 4 Liters of oxygen while Ambulating = 94%  Please briefly explain why patient needs home oxygen: Pt. desats during ambulation

## 2014-08-12 NOTE — Discharge Instructions (Signed)
Chronic Obstructive Pulmonary Disease °Chronic obstructive pulmonary disease (COPD) is a common lung condition in which airflow from the lungs is limited. COPD is a general term that can be used to describe many different lung problems that limit airflow, including both chronic bronchitis and emphysema.  If you have COPD, your lung function will probably never return to normal, but there are measures you can take to improve lung function and make yourself feel better.  °CAUSES  °· Smoking (common).   °· Exposure to secondhand smoke.   °· Genetic problems. °· Chronic inflammatory lung diseases or recurrent infections. °SYMPTOMS  °· Shortness of breath, especially with physical activity.   °· Deep, persistent (chronic) cough with a large amount of thick mucus.   °· Wheezing.   °· Rapid breaths (tachypnea).   °· Gray or bluish discoloration (cyanosis) of the skin, especially in fingers, toes, or lips.   °· Fatigue.   °· Weight loss.   °· Frequent infections or episodes when breathing symptoms become much worse (exacerbations).   °· Chest tightness. °DIAGNOSIS  °Your health care provider will take a medical history and perform a physical examination to make the initial diagnosis.  Additional tests for COPD may include:  °· Lung (pulmonary) function tests. °· Chest X-ray. °· CT scan. °· Blood tests. °TREATMENT  °Treatment available to help you feel better when you have COPD includes:  °· Inhaler and nebulizer medicines. These help manage the symptoms of COPD and make your breathing more comfortable. °· Supplemental oxygen. Supplemental oxygen is only helpful if you have a low oxygen level in your blood.   °· Exercise and physical activity. These are beneficial for nearly all people with COPD. Some people may also benefit from a pulmonary rehabilitation program. °HOME CARE INSTRUCTIONS  °· Take all medicines (inhaled or pills) as directed by your health care provider. °· Avoid over-the-counter medicines or cough syrups  that dry up your airway (such as antihistamines) and slow down the elimination of secretions unless instructed otherwise by your health care provider.   °· If you are a smoker, the most important thing that you can do is stop smoking. Continuing to smoke will cause further lung damage and breathing trouble. Ask your health care provider for help with quitting smoking. He or she can direct you to community resources or hospitals that provide support. °· Avoid exposure to irritants such as smoke, chemicals, and fumes that aggravate your breathing. °· Use oxygen therapy and pulmonary rehabilitation if directed by your health care provider. If you require home oxygen therapy, ask your health care provider whether you should purchase a pulse oximeter to measure your oxygen level at home.   °· Avoid contact with individuals who have a contagious illness. °· Avoid extreme temperature and humidity changes. °· Eat healthy foods. Eating smaller, more frequent meals and resting before meals may help you maintain your strength. °· Stay active, but balance activity with periods of rest. Exercise and physical activity will help you maintain your ability to do things you want to do. °· Preventing infection and hospitalization is very important when you have COPD. Make sure to receive all the vaccines your health care provider recommends, especially the pneumococcal and influenza vaccines. Ask your health care provider whether you need a pneumonia vaccine. °· Learn and use relaxation techniques to manage stress. °· Learn and use controlled breathing techniques as directed by your health care provider. Controlled breathing techniques include:   °¨ Pursed lip breathing. Start by breathing in (inhaling) through your nose for 1 second. Then, purse your lips as if you were   going to whistle and breathe out (exhale) through the pursed lips for 2 seconds.   °¨ Diaphragmatic breathing. Start by putting one hand on your abdomen just above  your waist. Inhale slowly through your nose. The hand on your abdomen should move out. Then purse your lips and exhale slowly. You should be able to feel the hand on your abdomen moving in as you exhale.   °· Learn and use controlled coughing to clear mucus from your lungs. Controlled coughing is a series of short, progressive coughs. The steps of controlled coughing are:   °· Lean your head slightly forward.   °· Breathe in deeply using diaphragmatic breathing.   °· Try to hold your breath for 3 seconds.   °· Keep your mouth slightly open while coughing twice.   °· Spit any mucus out into a tissue.   °· Rest and repeat the steps once or twice as needed. °SEEK MEDICAL CARE IF:  °· You are coughing up more mucus than usual.   °· There is a change in the color or thickness of your mucus.   °· Your breathing is more labored than usual.   °· Your breathing is faster than usual.   °SEEK IMMEDIATE MEDICAL CARE IF:  °· You have shortness of breath while you are resting.   °· You have shortness of breath that prevents you from: °· Being able to talk.   °· Performing your usual physical activities.   °· You have chest pain lasting longer than 5 minutes.   °· Your skin color is more cyanotic than usual. °· You measure low oxygen saturations for longer than 5 minutes with a pulse oximeter. °MAKE SURE YOU:  °· Understand these instructions. °· Will watch your condition. °· Will get help right away if you are not doing well or get worse. °Document Released: 03/17/2005 Document Revised: 10/22/2013 Document Reviewed: 02/01/2013 °[employer]® Patient Information ©2015 [employer]. This information is not intended to replace advice given to you by your health care provider. Make sure you discuss any questions you have with your health care provider. ° ° °Heart Failure °Heart failure is a condition in which the heart has trouble pumping blood. This means your heart does not pump blood efficiently for your body to work well. In some  cases of heart failure, fluid may back up into your lungs or you may have swelling (edema) in your lower legs. Heart failure is usually a long-term (chronic) condition. It is important for you to take good care of yourself and follow your health care provider's treatment plan. °CAUSES  °Some health conditions can cause heart failure. Those health conditions include: °· High blood pressure (hypertension). Hypertension causes the heart muscle to work harder than normal. When pressure in the blood vessels is high, the heart needs to pump (contract) with more force in order to circulate blood throughout the body. High blood pressure eventually causes the heart to become stiff and weak. °· Coronary artery disease (CAD). CAD is the buildup of cholesterol and fat (plaque) in the arteries of the heart. The blockage in the arteries deprives the heart muscle of oxygen and blood. This can cause chest pain and may lead to a heart attack. High blood pressure can also contribute to CAD. °· Heart attack (myocardial infarction). A heart attack occurs when one or more arteries in the heart become blocked. The loss of oxygen damages the muscle tissue of the heart. When this happens, part of the heart muscle dies. The injured tissue does not contract as well and weakens the heart's ability to pump blood. °·   Abnormal heart valves. When the heart valves do not open and close properly, it can cause heart failure. This makes the heart muscle pump harder to keep the blood flowing. °· Heart muscle disease (cardiomyopathy or myocarditis). Heart muscle disease is damage to the heart muscle from a variety of causes. These can include drug or alcohol abuse, infections, or unknown reasons. These can increase the risk of heart failure. °· Lung disease. Lung disease makes the heart work harder because the lungs do not work properly. This can cause a strain on the heart, leading it to fail. °· Diabetes. Diabetes increases the risk of heart failure.  High blood sugar contributes to high fat (lipid) levels in the blood. Diabetes can also cause slow damage to tiny blood vessels that carry important nutrients to the heart muscle. When the heart does not get enough oxygen and food, it can cause the heart to become weak and stiff. This leads to a heart that does not contract efficiently. °· Other conditions can contribute to heart failure. These include abnormal heart rhythms, thyroid problems, and low blood counts (anemia). °Certain unhealthy behaviors can increase the risk of heart failure, including: °· Being overweight. °· Smoking or chewing tobacco. °· Eating foods high in fat and cholesterol. °· Abusing illicit drugs or alcohol. °· Lacking physical activity. °SYMPTOMS  °Heart failure symptoms may vary and can be hard to detect. Symptoms may include: °· Shortness of breath with activity, such as climbing stairs. °· Persistent cough. °· Swelling of the feet, ankles, legs, or abdomen. °· Unexplained weight gain. °· Difficulty breathing when lying flat (orthopnea). °· Waking from sleep because of the need to sit up and get more air. °· Rapid heartbeat. °· Fatigue and loss of energy. °· Feeling light-headed, dizzy, or close to fainting. °· Loss of appetite. °· Nausea. °· Increased urination during the night (nocturia). °DIAGNOSIS  °A diagnosis of heart failure is based on your history, symptoms, physical examination, and diagnostic tests. Diagnostic tests for heart failure may include: °· Echocardiography. °· Electrocardiography. °· Chest X-ray. °· Blood tests. °· Exercise stress test. °· Cardiac angiography. °· Radionuclide scans. °TREATMENT  °Treatment is aimed at managing the symptoms of heart failure. Medicines, behavioral changes, or surgical intervention may be necessary to treat heart failure. °· Medicines to help treat heart failure may include: °¨ Angiotensin-converting enzyme (ACE) inhibitors. This type of medicine blocks the effects of a blood protein  called angiotensin-converting enzyme. ACE inhibitors relax (dilate) the blood vessels and help lower blood pressure. °¨ Angiotensin receptor blockers (ARBs). This type of medicine blocks the actions of a blood protein called angiotensin. Angiotensin receptor blockers dilate the blood vessels and help lower blood pressure. °¨ Water pills (diuretics). Diuretics cause the kidneys to remove salt and water from the blood. The extra fluid is removed through urination. This loss of extra fluid lowers the volume of blood the heart pumps. °¨ Beta blockers. These prevent the heart from beating too fast and improve heart muscle strength. °¨ Digitalis. This increases the force of the heartbeat. °· Healthy behavior changes include: °¨ Obtaining and maintaining a healthy weight. °¨ Stopping smoking or chewing tobacco. °¨ Eating heart-healthy foods. °¨ Limiting or avoiding alcohol. °¨ Stopping illicit drug use. °¨ Physical activity as directed by your health care provider. °· Surgical treatment for heart failure may include: °¨ A procedure to open blocked arteries, repair damaged heart valves, or remove damaged heart muscle tissue. °¨ A pacemaker to improve heart muscle function and control certain   abnormal heart rhythms. °¨ An internal cardioverter defibrillator to treat certain serious abnormal heart rhythms. °¨ A left ventricular assist device (LVAD) to assist the pumping ability of the heart. °HOME CARE INSTRUCTIONS  °· Take medicines only as directed by your health care provider. Medicines are important in reducing the workload of your heart, slowing the progression of heart failure, and improving your symptoms. °¨ Do not stop taking your medicine unless directed by your health care provider. °¨ Do not skip any dose of medicine. °¨ Refill your prescriptions before you run out of medicine. Your medicines are needed every day. °· Engage in moderate physical activity if directed by your health care provider. Moderate physical  activity can benefit some people. The elderly and people with severe heart failure should consult with a health care provider for physical activity recommendations. °· Eat heart-healthy foods. Food choices should be free of trans fat and low in saturated fat, cholesterol, and salt (sodium). Healthy choices include fresh or frozen fruits and vegetables, fish, lean meats, legumes, fat-free or low-fat dairy products, and whole grain or high fiber foods. Talk to a dietitian to learn more about heart-healthy foods. °· Limit sodium if directed by your health care provider. Sodium restriction may reduce symptoms of heart failure in some people. Talk to a dietitian to learn more about heart-healthy seasonings. °· Use healthy cooking methods. Healthy cooking methods include roasting, grilling, broiling, baking, poaching, steaming, or stir-frying. Talk to a dietitian to learn more about healthy cooking methods. °· Limit fluids if directed by your health care provider. Fluid restriction may reduce symptoms of heart failure in some people. °· Weigh yourself every day. Daily weights are important in the early recognition of excess fluid. You should weigh yourself every morning after you urinate and before you eat breakfast. Wear the same amount of clothing each time you weigh yourself. Record your daily weight. Provide your health care provider with your weight record. °· Monitor and record your blood pressure if directed by your health care provider. °· Check your pulse if directed by your health care provider. °· Lose weight if directed by your health care provider. Weight loss may reduce symptoms of heart failure in some people. °· Stop smoking or chewing tobacco. Nicotine makes your heart work harder by causing your blood vessels to constrict. Do not use nicotine gum or patches before talking to your health care provider. °· Keep all follow-up visits as directed by your health care provider. This is important. °· Limit  alcohol intake to no more than 1 drink per day for nonpregnant women and 2 drinks per day for men. One drink equals 12 ounces of beer, 5 ounces of wine, or 1½ ounces of hard liquor. Drinking more than that is harmful to your heart. Tell your health care provider if you drink alcohol several times a week. Talk with your health care provider about whether alcohol is safe for you. If your heart has already been damaged by alcohol or you have severe heart failure, drinking alcohol should be stopped completely. °· Stop illicit drug use. °· Stay up-to-date with immunizations. It is especially important to prevent respiratory infections through current pneumococcal and influenza immunizations. °· Manage other health conditions such as hypertension, diabetes, thyroid disease, or abnormal heart rhythms as directed by your health care provider. °· Learn to manage stress. °· Plan rest periods when fatigued. °· Learn strategies to manage high temperatures. If the weather is extremely hot: °¨ Avoid vigorous physical activity. °¨ Use   air conditioning or fans or seek a cooler location. °¨ Avoid caffeine and alcohol. °¨ Wear loose-fitting, lightweight, and light-colored clothing. °· Learn strategies to manage cold temperatures. If the weather is extremely cold: °¨ Avoid vigorous physical activity. °¨ Layer clothes. °¨ Wear mittens or gloves, a hat, and a scarf when going outside. °¨ Avoid alcohol. °· Obtain ongoing education and support as needed. °· Participate in or seek rehabilitation as needed to maintain or improve independence and quality of life. °SEEK MEDICAL CARE IF:  °· Your weight increases by 03 lb/1.4 kg in 1 day or 05 lb/2.3 kg in a week. °· You have increasing shortness of breath that is unusual for you. °· You are unable to participate in your usual physical activities. °· You tire easily. °· You cough more than normal, especially with physical activity. °· You have any or more swelling in areas such as your hands,  feet, ankles, or abdomen. °· You are unable to sleep because it is hard to breathe. °· You feel like your heart is beating fast (palpitations). °· You become dizzy or light-headed upon standing up. °SEEK IMMEDIATE MEDICAL CARE IF:  °· You have difficulty breathing. °· There is a change in mental status such as decreased alertness or difficulty with concentration. °· You have a pain or discomfort in your chest. °· You have an episode of fainting (syncope). °MAKE SURE YOU:  °· Understand these instructions. °· Will watch your condition. °· Will get help right away if you are not doing well or get worse. °Document Released: 06/07/2005 Document Revised: 10/22/2013 Document Reviewed: 07/07/2012 °[employer]® Patient Information ©2015 [employer]. This information is not intended to replace advice given to you by your health care provider. Make sure you discuss any questions you have with your health care provider. ° °

## 2014-08-13 NOTE — Progress Notes (Signed)
Patient discharge home with O2, escorted by volunteer services and NT. Patient has his yellow coated watch returned and placed onto his left wrist.  Discharge instructions reviewed and patient verbalized understanding using teach back. Patient has prescription to be filled at pharmacy.  Patient has right hand skin tear dressed with valsaline guaze

## 2014-08-13 NOTE — Plan of Care (Signed)
Problem: Phase I Progression Outcomes Goal: Progress activity as tolerated unless otherwise ordered Outcome: Completed/Met Date Met:  08/13/14 Home health PT and RN

## 2014-08-13 NOTE — Plan of Care (Signed)
Problem: Phase I Progression Outcomes Goal: O2 sats > or equal 90% or at baseline Outcome: Completed/Met Date Met:  08/13/14 D[patient discharge on home O2

## 2014-08-15 ENCOUNTER — Emergency Department: Payer: Self-pay | Admitting: Internal Medicine

## 2014-08-18 ENCOUNTER — Inpatient Hospital Stay: Payer: Self-pay | Admitting: Internal Medicine

## 2014-08-23 ENCOUNTER — Emergency Department: Payer: Self-pay | Admitting: Emergency Medicine
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
## 2014-10-12 NOTE — Consult Note (Signed)
Brief Consult Note: Diagnosis: hallucinations due to recent head injury or medication.   Patient was seen by consultant.   Consult note dictated.   Discussed with Attending MD.   Comments: Psychiatry: Patient seen and chart reviewed. PAtient with visual hallucinations from meds or head injury. Suggest deccrease or stop baclofen. No need for psych follow up. Note done.  Electronic Signatures: Audery Amellapacs, John T (MD)  (Signed 09-Dec-15 12:33)  Authored: Brief Consult Note   Last Updated: 09-Dec-15 12:33 by Audery Amellapacs, John T (MD)

## 2014-10-20 NOTE — H&P (Signed)
PATIENT NAME:  Chad Huffman, Chad Huffman MR#:  [id number] DATE OF BIRTH:  1946-01-07  DATE OF ADMISSION:  08/18/2014  REFERRING PHYSICIAN: Hale Sink. Dolores Frame, MD  PRIMARY CARE PHYSICIAN: Teena Irani. Terance Hart, MD  ADMITTING DIAGNOSES: Chest pain and hyponatremia.   HISTORY OF PRESENT ILLNESS: This is a 69 year old Caucasian male who presents to the Emergency Department complaining originally of chest pain. The patient states that the pain is sharp and incredibly brief describes it as a "stitch in his side." This complaint is actually secondary to his main concern or focus, which is constipation. The patient states that he has not had a bowel movement in 2 days. He feels like he may have been dehydrated following his last hospitalization approximately 1 week ago for congestive heart failure and that now he is unable to comfortably have a bowel movement. He is able to walk around his home without feeling short of breath; however, here in the Emergency Department, the patient, who is chronically on 4 L of oxygen via nasal cannula at home, had desaturation to 80% while he got up to use the commode in the exam room. He was also found to have worsening hyponatremia and, for these reasons, the Emergency Department called for admission.   REVIEW OF SYSTEMS:  CONSTITUTIONAL: The patient denies fever, but admits to general weakness.  EYES: Denies blurred vision or inflammation.  EARS, NOSE AND THROAT: Denies tinnitus or sore throat.  RESPIRATORY: Admits to some shortness of breath, but this baseline. He denies cough.  CARDIOVASCULAR: Admits to chest pain that he acknowledges is not pain from his heart or pain that is associated with exertion. He denies palpitations or proximal paroxysmal nocturnal dyspnea, but admits to occasional orthopnea, which has been decreasing in severity since his last hospital admission.  GASTROINTESTINAL: Denies nausea, vomiting, but admits to abdominal pain and constipation.  ENDOCRINE: Denies  polyuria or polydipsia.  HEMATOLOGIC AND LYMPHATIC: Admits to easy bruising, but denies bleeding.  INTEGUMENTARY: Denies rashes or lesions.  MUSCULOSKELETAL: Denies myalgias, but admits to back pain.  NEUROLOGIC: Denies numbness in his extremities or dysarthria.  PSYCHIATRIC: Denies depression or suicidal ideation.   PAST MEDICAL HISTORY: Hypertension, hypothyroidism, COPD, chronic neck and back pain, carpal tunnel syndrome, psoriasis, diastolic congestive heart failure, depression, and hyponatremia.   PAST SURGICAL HISTORY: Abdominal aneurysm repair, C2 through C7 fusion and revision, L4 and L5, decompression.   SOCIAL HISTORY: The patient lives by himself. His brother stays with him at the moment and is helping him since he has gotten out of the hospital. He quit smoking 8 years ago and he drinks some beer every 2-3 weeks. He denies any drug use.   FAMILY HISTORY: There are a lot of family nerves on his mother's side with diabetes.   MEDICATIONS:  1.  Albuterol with ipratropium 2.5 mg/0.5 mg/3 mL 1 nebulizer solution every 6 hours as needed for coughing, wheezing, or shortness of breath.  2.  Alprazolam 0.5 mg 1/2 tablet to 3/4 of tablet orally 2 times a day as needed for anxiety or nervousness.  3.  Aspirin 81 mg 1 tablet p.o. daily.  4.  Baclofen 20 mg 1 to 1-1/2 tablets p.o. every 3 hours as needed for muscle spasms.  5.  Combivent Respimat 100 mcg/20 mcg/inhalation 1 puff inhaled every 6 hours as needed for shortness of breath.  6.  Desonide 0.05% topical cream apply to affected area daily as needed.  7.  Furosemide 40 mg 1 tablet p.o. b.i.d.  8.  Ibuprofen 200 mg 2 tablets p.o. every 6 hours as needed for pain.  9.  Ketoconazole topical shampoo 2% apply to affected area 2 times a week as needed.  10.  Krill oil 1 capsule p.o. daily.  11.  Lactulose 10 mg/15 mL oral syrup 15 mL p.o. once a day as needed for constipation.  12.  Levothyroxine 125 mcg 1 tablet p.o. every morning.  13.   Magnesium hydroxide 8% suspension per 15 mL 15 mL orally at bedtime as needed for constipation.  14.  Metamucil 3.4 grams per 5.2 grams oral powder reconstitute 3 gram in water daily, intake orally as needed for constipation.  15.  Multivitamin 1 tablet p.o. daily.  16.  Oxycodone 5 mg 1 tablet p.o. every 4 hours as needed for pain.  17.  Polyethylene glycol powder reconstitution formula 17 grams orally as needed daily for constipation.  18.  Promethazine 12.5 mg 2 tablets p.o. every 6 hours as needed for nausea or vomiting.  19.  Refresh ophthalmic solution 2 drops to each affected eye once a day.  20.  Symbicort 160 mcg/4.5 mcg/inhalation 2 puffs inhaled 2 times a day.  21.  Vitamin B12 at 1000 mcg 1 tablet p.o. daily.   ALLERGIES: AMPICILLIN, CYCLOBENZAPRINE, AND GABAPENTIN.   PERTINENT LABORATORY RESULTS AND RADIOGRAPHIC FINDINGS: Serum glucose is 89, BUN 14, creatinine 1.02, serum sodium is 119, potassium is 3.2, chloride 80, bicarbonate 31, calcium is 8.3, lipase is 121. Ethanol level is undetectable. Troponin is negative. Urine drug screen is negative. White blood cell count is 11.7, hemoglobin is 11.9, hematocrit 37.1, platelet count is 136,000, MCV is 86. Urinalysis is negative for infection. Chest x-ray shows shallow inspiration with elevation of the right hemidiaphragm. There is atelectasis in the lung bases bilaterally and there is colonic interposition under the right hemidiaphragm as well as cardiac enlargement.   PHYSICAL EXAMINATION:  VITAL SIGNS: Temperature is 98, pulse 87, respirations 18, blood pressure is 160/87, pulse oximetry is 98% on 4 L of oxygen via nasal cannula.  GENERAL: The patient is alert and oriented x 3 in no apparent physical distress; however, the patient admits to feeling uncomfortable and he is very anxious, as he wants to use the commode, but the nurses will not allow him out of the bed at this time.  HEENT: Normocephalic, atraumatic. Pupils equal, round, and  reactive to light and accommodation. Extraocular movements are intact. Mucous membranes are moist.  NECK: Trachea is midline. No adenopathy. Thyroid is nonpalpable, nontender.  CHEST: Symmetric and atraumatic.  CARDIOVASCULAR: Regular rate and rhythm. Normal S1, S2. No rubs, clicks, or murmurs appreciated.  LUNGS: Clear to auscultation bilaterally. Normal effort and excursion. The patient is wearing nasal cannula for supplemental oxygen. ABDOMEN: Positive bowel sounds. Soft, nontender, nondistended. No hepatosplenomegaly.  GENITOURINARY: Deferred.  MUSCULOSKELETAL: The patient moves all 4 extremities equally. I have not tested his strength but he is able to push himself up in bed with his lower extremities and he has 5/5 strength in his upper extremities bilaterally.  SKIN: There are no rashes or lesions; however, the patient does have multiple ecchymoses over both arms that he states are from his previous hospitalization.  EXTREMITIES: No clubbing or cyanosis. There is trace lower extremity edema.  NEUROLOGIC: Cranial nerves II-XII are grossly intact.  PSYCHIATRIC: Mood is normal. Affect is congruent. The patient has excellent insight and judgment into his medical condition.   ASSESSMENT AND PLAN: This is a 69 year old male admitted for chest pain and  hyponatremia.  1.  Chest pain. This is most likely musculoskeletal, as the patient describes it as a stitch in his side and the pain is essentially gone. He may be having some cramping secondary to diuresis with Lasix or his constipation distending his abdomen and that the pain is referred to his chest. His desaturation on the way to the commode earlier in the evening may indeed have been real, but we expect the patient to desaturate sometimes, as he has moderately severe congestive heart failure; however, the patient notes that he does not feel short of breath and frequently walks much further around his home than he does from the bed to the commode in  the exam room. Nonetheless, we will cycle his cardiac enzymes to rule out myocardial ischemia. We will place the patient on telemetry and/or cardiology consult at the discretion of the primary team.  2.  Hypertension, currently uncontrolled. We will start an ACE inhibitor for his congestive heart failure and management of his blood pressure.  3.  Hyponatremia. This is likely secondary to hypervolemia. The patient needs fluid restriction.  4.  Chronic obstructive pulmonary disease. We will continue the patient's inhalers, per his home regimen.  5.  Hypothyroidism. We will continue levothyroxine. The patient's TSH was high on his recent visit to [facility]. We would not expect it to have adjusted with the current dose of Synthroid if it had just been increased. We will see maximal effect at 6 weeks; however, we could obtain further laboratory evaluation to determine if his reported anasarca from the last hospitalization is secondary to myxedema.  6.  Musculoskeletal pain. This is chronic. The patient may have his home doses of oxycodone as needed.  7.  Constipation. There is a lot of anxiety involved with the patient's complaints at this time, as he states that he clearly feels stool in the vault and does not want to use the bedpan. After seeing the patient in the Emergency Department, he had a very large bowel movement upon arrival to the floor. I have prescribed an aggressive bowel regimen, per the patient's request, although this may actually cause more loose stools.  8.  Deep vein thrombosis prophylaxis. Heparin.  9.  Gastrointestinal prophylaxis. None, as the patient is not critically ill.   CODE STATUS: The patient is a full code.   TIME SPENT ON ADMISSION ORDERS AND PATIENT CARE: Approximately 35 minutes.    ____________________________ Kelton Pillar. Sheryle Hail, MD msd:bm D: 08/20/2014 00:00:56 ET T: 08/20/2014 00:43:43 ET JOB#: [id number]  cc: Kelton Pillar. Sheryle Hail, MD, <Dictator> Kelton Pillar Goro Wenrick  MD ELECTRONICALLY SIGNED 08/22/2014 5:17

## 2014-10-20 NOTE — Discharge Summary (Signed)
PATIENT NAME:  Chad Huffman, Ahmaad L MR#:  [id number] DATE OF BIRTH:  07-17-1945  DATE OF ADMISSION:  08/18/2014 DATE OF DISCHARGE:  08/21/2014  ADMITTING PHYSICIAN:  Joycelyn RuaMichael Diamond, MD   DISCHARGING PHYSICIAN: Enid Baasadhika Aria Pickrell, MD.   PRIMARY CARE PHYSICIAN: Dorothey Basemanavid Bronstein, MD.   PRIMARY PULMONOLOGIST:  Clenton PareHerbon E. Meredeth IdeFleming, MD.  CONSULTATIONS IN THE HOSPITAL:  Nephrology consultation by Dr. Mady HaagensenMunsoor Lateef.    DISCHARGE DIAGNOSES:   1.  Hyponatremia, possibly secondary to dehydration and also syndrome of inappropriate antidiuretic hormone secretion.   2.  Syndrome of inappropriate antidiuretic hormone secretion.   3.  Diastolic congestive heart failure. 4.  Chronic obstructive pulmonary disease on 4 liters home oxygen.  5.  Chronic constipation.  6.  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  DISCHARGE HOME MEDICATIONS:  1.  Symbicort 160/4.5, two puffs twice a day.  2.  Aspirin 81 mg p.o. daily.  3.  Ketoconazole 2% topical shampoo twice a week as needed.  4.  Multivitamin 1 tablet p.o. daily.  5.  Vitamin B12 1000 mcg p.o. daily.  6.  Xanax 0.5 mg 1/2 tablet orally twice a day as needed for anxiety.  7.  Baclofen 20 mg 1 to 1-1/2 tablet orally every 4 hours as needed for muscle spasm.  8.  Desonide 0.05% topical cream to affected area once a day as needed.  9.  Ibuprofen 200 mg 2 tablets every 6 hours p.r.n. for pain.  10. DuoNeb 3 mL every 6 hours p.r.n. for shortness of breath.  11, Combivent Respimat 1 puff every 6 hours p.r.n. for shortness of breath. 12. Krill oil capsules, 1 capsule daily.  13. Levothyroxine 125 mcg p.o. daily.  14. Magnesium hydroxide 8% oral suspension 15 mL once a day at bedtime as needed for constipation.  15. Refresh ophthalmic solution 2 drops each eye once a day.  16. Oxycodone 5 mg every 4 hours p.r.n. for pain.  17. Polyethylene glycol 17 grams powder p.o. daily p.r.n. for constipation.  18. Metamucil 3 grams once a day as needed for  constipation.  19. Promethazine 12.5 mg 2 tablets every 6 hours p.r.n. for nausea and vomiting.  20. Lasix 20 mg p.o. daily.  21. Lactulose 30 mL twice a day p.r.n. for constipation.  22. Lisinopril 10 mg p.o. daily.  23. Senokot 2 tablets p.o. once a day.   DISCHARGE DIET: Regular diet.   DISCHARGE HOME OXYGEN: 4 liters.   DISCHARGE ACTIVITY: As tolerated.    FOLLOWUP INSTRUCTIONS:  1.  Fluid restrictions to less than 1200 mL per day.   2.  Nephrology follow-up with Dr. Cherylann RatelLateef in 3 weeks.  3.  PCP follow-up in 1 to 2 weeks and have a basic metabolic panel checked at that time.    LABORATORIES AND IMAGING STUDIES PRIOR TO DISCHARGE: Sodium 129, potassium 4.0, chloride 91, bicarbonate 31, BUN 12, creatinine 0.97, glucose 87, and calcium 8.9.  Uric acid level is low at 3.2, serum osmolality is low at 257, urine osmolality is elevated at 310.  Urine sodium is 85.  TSH is 3.47.   LDL cholesterol 105, HDL 47, total cholesterol 045124, triglycerides of 109. Troponins are negative.  Stool for C difficile is negative.  WBCs 11.7, hemoglobin 11.9, hematocrit 37.1, platelet count 136,000.  Serum alcohol level is negative. Chest x-ray on admission showing shallow inspiration, elevation of the right hemidiaphragm, atelectasis. Cardiac enlargement, but otherwise Echo Doppler LV ejection fraction is 45 to 50 per LV ejection fraction is  45% to 50%, mildly decreased global left ventricular systolic function, concentric LVH is noted.     BRIEF HOSPITAL COURSE: [name] is a 69 year old, Caucasian male with past medical history significant for COPD on 4 liters of home oxygen, diastolic CHF, chronic C-spine disease with radiculopathy who presents to the hospital after recent discharge from [facility] about a week ago complaining of dizziness and lightheadedness and noted to have hyponatremia, sodium of 119.   1.  Hyponatremia. Partly dehydration on admission because the patient's Lasix was increased to 80 mg p.o.  b.i.d. during his recent [facility] stay as he was admitted for chronic obstructive pulmonary disease and congestive heart failure exacerbation. He was taking 20 mg daily prior to that admission.  However, he was dry and felt constipated.  He was given gentle hydration. The patient has known history of syndrome of inappropriate antidiuretic hormone secretion.  His baseline sodium is around 128 - 129 and that is what he has run during his [facility] course too.  He was seen by nephrology here.  He was started on fluid restriction, low-dose Lasix 20 mg has been started and IV fluids have been stopped after he was replenished. He received 2 doses of tolvaptan p.o. here.  His discharge: Sodium is 129 at this time and he is clinically much improved and will follow up with PCP and nephrology as an outpatient.  2.  Constipation, likely has chronic constipation issues on pain medications. Three multiple laxatives were prescribed.  3.  Chronic obstructive pulmonary disease, stable on 4 liters home oxygen. He was on 2 liters before and I think at [facility] discharge he was increased to 4 liters and it was advised to follow up with Dr. Meredeth Ide.  All his home inhalers are being continued.  4.  Diastolic congestive heart failure, thought to be diastolic, however echo here shows an ejection fraction is 45% to 50%.  His blood pressure, low normal, is not in any medications at this time other than an aspirin and he can follow up with PCP or cardiology as an outpatient. 5.  His course has been otherwise uneventful in the hospital.    DISCHARGE CONDITION: Stable.   DISCHARGE DISPOSITION: Home.   TIME SPENT ON DISCHARGE: 45 minutes.      ____________________________ Enid Baas, MD rk:DT D: 08/21/2014 16:19:38 ET T: 08/22/2014 14:38:53 ET JOB#: [id number]  cc: Enid Baas, MD, <Dictator> Teena Irani. Terance Hart, MD Herbon E. Meredeth Ide, MD Lennox Pippins, MD   Enid Baas MD ELECTRONICALLY  SIGNED 09/05/2014 18:02

## 2014-10-23 ENCOUNTER — Emergency Department: Payer: Medicare Other

## 2014-10-23 ENCOUNTER — Other Ambulatory Visit: Payer: Self-pay

## 2014-10-23 ENCOUNTER — Encounter: Payer: Self-pay | Admitting: *Deleted

## 2014-10-23 ENCOUNTER — Emergency Department
Admission: EM | Admit: 2014-10-23 | Discharge: 2014-10-23 | Disposition: A | Discharge status: Home / Self Care | Payer: Medicare Other | Attending: Internal Medicine | Admitting: Internal Medicine

## 2014-10-23 DIAGNOSIS — R0602 Shortness of breath: Secondary | ICD-10-CM

## 2014-10-23 DIAGNOSIS — Z87891 Personal history of nicotine dependence: Secondary | ICD-10-CM | POA: Insufficient documentation

## 2014-10-23 DIAGNOSIS — Z79899 Other long term (current) drug therapy: Secondary | ICD-10-CM | POA: Insufficient documentation

## 2014-10-23 DIAGNOSIS — K5900 Constipation, unspecified: Secondary | ICD-10-CM | POA: Diagnosis not present

## 2014-10-23 DIAGNOSIS — I10 Essential (primary) hypertension: Secondary | ICD-10-CM | POA: Diagnosis present

## 2014-10-23 DIAGNOSIS — G8929 Other chronic pain: Secondary | ICD-10-CM | POA: Insufficient documentation

## 2014-10-23 DIAGNOSIS — R600 Localized edema: Secondary | ICD-10-CM | POA: Insufficient documentation

## 2014-10-23 DIAGNOSIS — Z7982 Long term (current) use of aspirin: Secondary | ICD-10-CM | POA: Diagnosis not present

## 2014-10-23 DIAGNOSIS — J441 Chronic obstructive pulmonary disease with (acute) exacerbation: Secondary | ICD-10-CM | POA: Insufficient documentation

## 2014-10-23 DIAGNOSIS — R609 Edema, unspecified: Secondary | ICD-10-CM

## 2014-10-23 LAB — COMPREHENSIVE METABOLIC PANEL
ALK PHOS: 65 U/L (ref 38–126)
ALT: 13 U/L — ABNORMAL LOW (ref 17–63)
ANION GAP: 7 (ref 5–15)
AST: 16 U/L (ref 15–41)
Albumin: 4.1 g/dL (ref 3.5–5.0)
BILIRUBIN TOTAL: 1.6 mg/dL — AB (ref 0.3–1.2)
BUN: 14 mg/dL (ref 6–20)
CHLORIDE: 94 mmol/L — AB (ref 101–111)
CO2: 35 mmol/L — ABNORMAL HIGH (ref 22–32)
CREATININE: 1 mg/dL (ref 0.61–1.24)
Calcium: 9 mg/dL (ref 8.9–10.3)
GFR calc Af Amer: >60 mL/min (ref >60)
GFR calc non Af Amer: >60 mL/min (ref >60)
Glucose, Bld: 103 mg/dL — ABNORMAL HIGH (ref 65–99)
POTASSIUM: 4.4 mmol/L (ref 3.5–5.1)
Sodium: 136 mmol/L (ref 135–145)
Total Protein: 7 g/dL (ref 6.5–8.1)

## 2014-10-23 LAB — CBC WITH DIFFERENTIAL/PLATELET
BASOS ABS: 0 10*3/uL (ref 0–0.1)
Basophils Relative: 1 %
EOS ABS: 0.1 10*3/uL (ref 0–0.7)
Eosinophils Relative: 2 %
HCT: 36.6 % — ABNORMAL LOW (ref 40.0–52.0)
Hemoglobin: 12 g/dL — ABNORMAL LOW (ref 13.0–18.0)
Lymphocytes Relative: 19 %
Lymphs Abs: 1.1 10*3/uL (ref 1.0–3.6)
MCH: 29 pg (ref 26.0–34.0)
MCHC: 32.6 g/dL (ref 32.0–36.0)
MCV: 88.9 fL (ref 80.0–100.0)
Monocytes Absolute: 0.8 10*3/uL (ref 0.2–1.0)
Monocytes Relative: 14 %
NEUTROS ABS: 3.7 10*3/uL (ref 1.4–6.5)
NEUTROS PCT: 64 %
Platelets: 135 10*3/uL — ABNORMAL LOW (ref 150–440)
RBC: 4.12 MIL/uL — ABNORMAL LOW (ref 4.40–5.90)
RDW: 18.4 % — AB (ref 11.5–14.5)
WBC: 5.7 10*3/uL (ref 3.8–10.6)

## 2014-10-23 LAB — BRAIN NATRIURETIC PEPTIDE: B Natriuretic Peptide: 73 pg/mL (ref 0.0–100.0)

## 2014-10-23 LAB — TROPONIN I: Troponin I: <0.03 ng/mL (ref <0.031)

## 2014-10-23 MED ORDER — SODIUM CHLORIDE 0.9 % IV SOLN
INTRAVENOUS | Status: DC
  Administered 2014-10-23: 17:00:00 via INTRAVENOUS

## 2014-10-23 MED ORDER — LACTULOSE 10 GM/15ML PO SOLN: 20.0000 g | Freq: Every day | ORAL | Status: DC | PRN

## 2014-10-23 MED ORDER — FUROSEMIDE 10 MG/ML IJ SOLN
80.0000 mg | Freq: Once | INTRAMUSCULAR | Status: AC
  Administered 2014-10-23: 80 mg via INTRAVENOUS

## 2014-10-23 MED ORDER — FUROSEMIDE 10 MG/ML IJ SOLN
INTRAMUSCULAR | Status: AC
  Administered 2014-10-23: 80 mg via INTRAVENOUS
  Filled 2014-10-23: qty 8

## 2014-10-23 NOTE — ED Provider Notes (Signed)
[facility] Emergency Department Provider Note    ____________________________________________  Time seen: 4:15 PM  I have reviewed the triage vital signs and the nursing notes.   HISTORY  Chief Complaint Hypertension   Patient arrived in the emergency department by EMS. Patient called EMS himself.    HPI Illene LabradorDanny L Aldape is a 69 y.o. male who was brought to the emergency department by EMS with a chief complaint of elevated blood pressure at home numbness and tingling to his hands and his feet slightly blurred vision, headache, and increased swelling to his lower extremities bilaterally with an additional complaint a three-day history of constipation.  Today Mr. Marina Goodellerry took his blood pressure at home and it was 190/106 which is high for him. He did take his regular blood pressure medicine. The tingling stiffness to his hands and feet is not new he's had this for several weeks but He does feel like it's worse today because he has increased swelling to his lower extremities. He rates it as moderate in severity.  He is having mild shortness of breath he does use home oxygen on a daily basis 4 L since February when [facility] and dismissed with a diagnosis of COPD.  His shortness of breath is worse on exertion. He denies having any chest pain. No syncope.  He has not had a normal bowel movement since 6 days ago. He has tried milk of magnesia Dulcolax at home without good results. He did take lactulose this morning but still has not moved his bowels.          Past Medical History  Diagnosis Date  . Antalgic gait   . Hypertension   . Lumbago   . COPD (chronic obstructive pulmonary disease)   . Complication of anesthesia     pt has had cervical fusion-limited neck flextion  . Depression   . DJD (degenerative joint disease)     Right shoulder  . Carotid artery occlusion     Patient Active Problem List   Diagnosis  Date Noted  . Cervical vertebral fusion   . History of lumbar laminectomy   . Chronic respiratory acidosis   . Depression   . Chronic neck and back pain   . Acute on chronic respiratory failure with hypercapnia   . Acute on chronic respiratory failure with hypoxia   . COPD exacerbation   . HCAP (healthcare-associated pneumonia)   . Congestive dilated cardiomyopathy   . Syncope and collapse   . Hyponatremia   . Other specified hypothyroidism   . Acute respiratory failure with hypoxia and hypercarbia 08/04/2014  . S/P cervical spinal fusion 08/04/2014  . S/P lumbar laminectomy 08/04/2014  . Right shoulder pain 08/04/2014  . Arthritis of right hip 08/04/2014  . Essential hypertension 08/04/2014  . Hypothyroidism 08/04/2014  . Syncope 08/04/2014  . Fall 08/04/2014  . Low back pain 08/04/2014  . Pedal edema 08/04/2014  . Occlusion and stenosis of carotid artery without mention of cerebral infarction 12/10/2013  . Aftercare following surgery of the circulatory system, NEC 12/10/2013  . Pain of right upper extremity-Right Neck-Shoulder-Back 12/10/2013  . Abdominal aneurysm without mention of rupture 12/03/2011     Past Surgical History  Procedure Laterality Date  . Tonsillectomy    . Carpal tunnel release      bilateral CTS  . Abdominal aortic aneurysm repair  2009    Endovascular AAA repair  . Pilonidal cyst / sinus excision    .  Spine surgery  09/15/11    Scar tissue removed- back  . Spine surgery  09/15/11    Scar tissue removed- back  . Neck surgery    . Rotary cuff    . Hammer toe surgery  12/10/2011    Procedure: HAMMER TOE CORRECTION;  Surgeon: Ernestene KielMax Hyatt, DPM;  Location: [facility];  Service: Podiatry;  Laterality: Right;  Right hammertoe repair second right with 2.5x42 orthopro screw   . Metatarsal osteotomy  12/10/2011    Procedure: METATARSAL OSTEOTOMY;  Surgeon: Ernestene KielMax Hyatt, DPM;  Location: [facility];  Service: Podiatry;  Laterality:  Right;  Right Keller arthroplasty with size 5 silicone implant right great toe; second metatarsal osteotomy with 2.0x14 screw    Current Outpatient Rx  Name  Route  Sig  Dispense  Refill  . ALPRAZolam (XANAX) 0.5 MG tablet   Oral   Take 0.5-0.75 mg by mouth 2 (two) times daily as needed for anxiety.         Marland Kitchen. aspirin 81 MG tablet   Oral   Take 81 mg by mouth daily.         . baclofen (LIORESAL) 20 MG tablet   Oral   Take 20-30 mg by mouth every 3 (three) hours as needed for muscle spasms.         Marland Kitchen. desonide (DESOWEN) 0.05 % cream   Topical   Apply 0.05 % topically as needed.         . furosemide (LASIX) 40 MG tablet   Oral   Take 1 tablet (40 mg total) by mouth 2 (two) times daily.   60 tablet   0   . ibuprofen (ADVIL,MOTRIN) 200 MG tablet   Oral   Take 400 mg by mouth every 6 (six) hours as needed.         . Ipratropium-Albuterol (COMBIVENT RESPIMAT) 20-100 MCG/ACT AERS respimat   Inhalation   Inhale 1 puff into the lungs every 6 (six) hours.   1 Inhaler   0     For use when you are away from home and can't use  ...   . ipratropium-albuterol (DUONEB) 0.5-2.5 (3) MG/3ML SOLN   Nebulization   Take 3 mLs by nebulization every 6 (six) hours.   360 mL   0   . ketoconazole (NIZORAL) 2 % shampoo               . KRILL OIL PO   Oral   Take by mouth daily.         Marland Kitchen. levothyroxine (SYNTHROID, LEVOTHROID) 125 MCG tablet   Oral   Take 1 tablet (125 mcg total) by mouth daily before breakfast.   30 tablet   0   . magnesium hydroxide (MILK OF MAGNESIA) 400 MG/5ML suspension   Oral   Take by mouth daily as needed for mild constipation.         . Multiple Vitamin (MULTIVITAMIN) tablet   Oral   Take 1 tablet by mouth daily.         Marland Kitchen. OVER THE COUNTER MEDICATION   Both Eyes   Place 2 drops into both eyes daily.         Marland Kitchen. oxycodone (OXY-IR) 5 MG capsule   Oral   Take 5 mg by mouth every 4 (four) hours as needed for pain.         .  polyethylene glycol (MIRALAX / GLYCOLAX) packet   Oral   Take 17 g  by mouth daily as needed.         Marland Kitchen EXPIRED: promethazine (PHENERGAN) 12.5 MG tablet   Oral   Take 2 tablets (25 mg total) by mouth every 6 (six) hours as needed for nausea.   30 tablet   0   . SYMBICORT 160-4.5 MCG/ACT inhaler   Inhalation   Inhale 2 puffs into the lungs 2 (two) times daily.          . vitamin B-12 (CYANOCOBALAMIN) 1000 MCG tablet   Oral   Take 1,000 mcg by mouth every 30 (thirty) days.           Allergies Ampicillin; Brimonidine; Cyclobenzaprine; and Gabapentin  Family History  Problem Relation Age of Onset  . Cancer Mother   . Hypertension Brother   . Heart attack Father     Social History History  Substance Use Topics  . Smoking status: Former Smoker    Quit date: 08/30/2006  . Smokeless tobacco: Never Used  . Alcohol Use: 1.2 oz/week    2 Cans of beer per week     Comment: occasional use    Review of Systems  Constitutional: Negative for fever. Eyes: Negative for visual changes. ENT: Negative for sore throat. Cardiovascular: Negative for chest pain. Respiratory: Positive for shortness of breath. Gastrointestinal: Negative for abdominal pain, vomiting and diarrhea. Genitourinary: Negative for dysuria. Musculoskeletal: Negative for back pain. Skin: Negative for rash. Neurological: Positive for headaches, no focal weakness does complain of tingling of his hands and feet. Extremities positive for swelling   10-point ROS otherwise negative.  ____________________________________________   PHYSICAL EXAM:  VITAL SIGNS: ED Triage Vitals  Enc Vitals Group     BP 10/23/14 1621 150/104 mmHg     Pulse Rate 10/23/14 1621 76     Resp 10/23/14 1621 18     Temp 10/23/14 1621 98.2 F (36.8 C)     Temp Source 10/23/14 1621 Oral     SpO2 10/23/14 1621 100 %     Weight 10/23/14 1621 245 lb (111.131 kg)     Height 10/23/14 1621  (1.727 m)     Head Cir --      Peak  Flow --      Pain Score 10/23/14 1623 3     Pain Loc --      Pain Edu? --      Excl. in GC? --    Initial vital signs in the emergency department revealed an elevated blood pressure 150/104 heart rate is normal at 76 respiratory normal at 18 afebrile at 98.2 O2 sat on his usual 4 L oxygen is 100%.   Obese in no apparent distress Constitutional: Alert and oriented. Well appearing and in no distress. Eyes: Conjunctivae are normal. PERRL. Normal extraocular movements. ENT   Head: Normocephalic and atraumatic.   Nose: No congestion/rhinnorhea.   Mouth/Throat: Mucous membranes are moist.   Neck: No stridor. He Hematological/Lymphatic/Immunilogical: No cervical lymphadenopathy. Cardiovascular: Normal rate, regular rhythm. Normal and symmetric distal pulses are present in all extremities. No murmurs, rubs, or gallops. Respiratory: Normal respiratory effort without tachypnea nor retractions. Breath sounds are clear and equal bilaterally. No wheezes/rales/rhonchi. Gastrointestinal: Soft and nontender. No distention. No abdominal bruits. There is no CVA tenderness. Genitourinary: Deferred  Musculoskeletal: Nontender with normal range of motion in all extremities. No joint effusions.  No lower extremity tenderness nor edema. Neurologic:  Normal speech and language. No gross focal neurologic deficits are appreciated. Speech is normal. No gait instability. Skin:  Skin is warm, dry and intact. No rash noted. Psychiatric: Mood and affect are normal. Speech and behavior are normal. Patient exhibits appropriate insight and judgment.  ____________________________________________    LABS (pertinent positives/negatives)  Labs Reviewed  CBC WITH DIFFERENTIAL/PLATELET - Abnormal; Notable for the following:    RBC 4.12 (*)    Hemoglobin 12.0 (*)    HCT 36.6 (*)    RDW 18.4 (*)    Platelets 135 (*)    All other components within normal limits  COMPREHENSIVE METABOLIC PANEL - Abnormal;  Notable for the following:    Chloride 94 (*)    CO2 35 (*)    Glucose, Bld 103 (*)    ALT 13 (*)    Total Bilirubin 1.6 (*)    All other components within normal limits  TROPONIN I  BRAIN NATRIURETIC PEPTIDE    labs are significant for mild anemia on the CBC and elevated glucose of 103 mild bicarbonate 34 bilirubin of 1.6. This is slightly elevated than previous bilirubin of 1.22 months ago. His BUN and creatinine are and his troponin is normal at less than 0.3 his BNP is normal at 73. ____________________________________________   EKG  ED ECG REPORT   Date: 10/23/2014  EKG Time: 1624  Rate: 75  Rhythm: normal EKG, normal sinus rhythm, unchanged from previous tracings  Axis: Right axis deviation and right bundle branch block  Intervals:right bundle branch block first-degree AV block.  ST&T Change nonspecific ST-T wave changes.  ____________________________________________    RADIOLOGY chest x-ray was done in the emergency department and shows chronic basilar atelectasis no new findings. KUB done in the emergency department shows a nonobstructive bulk bowel gas pattern and no acute pathology.__________________________________________   PROCEDURES  Procedure(s) performed: None  Critical Care performed: No  ____________________________________________   INITIAL IMPRESSION / ASSESSMENT AND PLAN / ED COURSE  Pertinent labs & imaging results that were available during my care of the patient were reviewed by me and considered in my medical decision making (see chart for details).  This 69 year old gentleman presents. to the ED from home chief complaint elevated blood pressure with associated tingling in his extremities and increased edema, headaches, some blurred vision and also constipation.   Also having some slight shortness of breath.   He received 80 mg Lasix IV in the emergency department if shortness of breath improved his blood pressure improved from  150/104-120/81 in the emergency department. His respiratory rate is good at 15 and he continues to have O2 sat of 100%.  Date be dismissed from the emergency department with instructions to increase his Lasix for the next 2 days and to decrease his intake of salt  Is constipation will be treated with lactulose outpatient.   ____________________________________________   FINAL CLINICAL IMPRESSION(S) / ED DIAGNOSES  Final diagnoses:  SOB (shortness of breath)  Constipation  Constipation  Constipation  1. Uncontrolled hypertension 2. Peripheral edema 3..constipation   Sherlyn Hay, DO 10/24/14 1652

## 2014-10-23 NOTE — ED Notes (Signed)
Pt arrive via ems from home. Pt checked his bp today and it was elevated (190's), has been having numbness and tingling, with some sob.

## 2014-10-23 NOTE — Discharge Instructions (Signed)
Increase Lasix to 2 tabs by mouth daily for the next 2 days. Lactulose as directed as needed for constipation. Keep her legs elevated and wear TED stockings Decrease salt in your diet See her doctor in follow-up as directed The symptoms worsen or do not improve return to the emergency department

## 2014-10-23 NOTE — ED Notes (Signed)
Transported to xray 

## 2014-10-26 ENCOUNTER — Other Ambulatory Visit: Payer: Self-pay

## 2014-10-26 ENCOUNTER — Emergency Department
Admission: EM | Admit: 2014-10-26 | Discharge: 2014-10-26 | Disposition: A | Discharge status: Home / Self Care | Payer: Medicare Other | Attending: Emergency Medicine | Admitting: Emergency Medicine

## 2014-10-26 ENCOUNTER — Emergency Department: Payer: Medicare Other

## 2014-10-26 DIAGNOSIS — R202 Paresthesia of skin: Secondary | ICD-10-CM | POA: Insufficient documentation

## 2014-10-26 DIAGNOSIS — Z880 Allergy status to penicillin: Secondary | ICD-10-CM | POA: Insufficient documentation

## 2014-10-26 DIAGNOSIS — G64 Other disorders of peripheral nervous system: Secondary | ICD-10-CM | POA: Insufficient documentation

## 2014-10-26 DIAGNOSIS — Z7951 Long term (current) use of inhaled steroids: Secondary | ICD-10-CM | POA: Diagnosis not present

## 2014-10-26 DIAGNOSIS — Z87891 Personal history of nicotine dependence: Secondary | ICD-10-CM | POA: Diagnosis not present

## 2014-10-26 DIAGNOSIS — I10 Essential (primary) hypertension: Secondary | ICD-10-CM | POA: Insufficient documentation

## 2014-10-26 DIAGNOSIS — G8929 Other chronic pain: Secondary | ICD-10-CM | POA: Diagnosis not present

## 2014-10-26 DIAGNOSIS — Z7982 Long term (current) use of aspirin: Secondary | ICD-10-CM | POA: Insufficient documentation

## 2014-10-26 DIAGNOSIS — J441 Chronic obstructive pulmonary disease with (acute) exacerbation: Secondary | ICD-10-CM | POA: Insufficient documentation

## 2014-10-26 DIAGNOSIS — G629 Polyneuropathy, unspecified: Secondary | ICD-10-CM

## 2014-10-26 DIAGNOSIS — M542 Cervicalgia: Secondary | ICD-10-CM

## 2014-10-26 DIAGNOSIS — M6281 Muscle weakness (generalized): Secondary | ICD-10-CM | POA: Diagnosis not present

## 2014-10-26 DIAGNOSIS — R0602 Shortness of breath: Secondary | ICD-10-CM | POA: Diagnosis present

## 2014-10-26 DIAGNOSIS — Z79899 Other long term (current) drug therapy: Secondary | ICD-10-CM | POA: Insufficient documentation

## 2014-10-26 LAB — CBC WITH DIFFERENTIAL/PLATELET
Basophils Absolute: 0 10*3/uL (ref 0–0.1)
Basophils Relative: 0 %
EOS PCT: 1 %
Eosinophils Absolute: 0.1 10*3/uL (ref 0–0.7)
HCT: 38.2 % — ABNORMAL LOW (ref 40.0–52.0)
Hemoglobin: 12.2 g/dL — ABNORMAL LOW (ref 13.0–18.0)
LYMPHS ABS: 1 10*3/uL (ref 1.0–3.6)
LYMPHS PCT: 6 %
MCH: 28.7 pg (ref 26.0–34.0)
MCHC: 32 g/dL (ref 32.0–36.0)
MCV: 89.5 fL (ref 80.0–100.0)
Monocytes Absolute: 1.4 10*3/uL — ABNORMAL HIGH (ref 0.2–1.0)
Monocytes Relative: 9 %
NEUTROS PCT: 84 %
Neutro Abs: 13.8 10*3/uL — ABNORMAL HIGH (ref 1.4–6.5)
Platelets: 125 10*3/uL — ABNORMAL LOW (ref 150–440)
RBC: 4.27 MIL/uL — ABNORMAL LOW (ref 4.40–5.90)
RDW: 18.5 % — AB (ref 11.5–14.5)
WBC: 16.3 10*3/uL — AB (ref 3.8–10.6)

## 2014-10-26 LAB — BASIC METABOLIC PANEL
Anion gap: 5 (ref 5–15)
BUN: 30 mg/dL — ABNORMAL HIGH (ref 6–20)
CO2: 37 mmol/L — ABNORMAL HIGH (ref 22–32)
Calcium: 8.5 mg/dL — ABNORMAL LOW (ref 8.9–10.3)
Chloride: 92 mmol/L — ABNORMAL LOW (ref 101–111)
Creatinine, Ser: 1.2 mg/dL (ref 0.61–1.24)
GFR calc Af Amer: >60 mL/min (ref >60)
GFR calc non Af Amer: >60 mL/min (ref >60)
Glucose, Bld: 92 mg/dL (ref 65–99)
POTASSIUM: 4.5 mmol/L (ref 3.5–5.1)
Sodium: 134 mmol/L — ABNORMAL LOW (ref 135–145)

## 2014-10-26 LAB — TROPONIN I: Troponin I: <0.03 ng/mL (ref <0.031)

## 2014-10-26 NOTE — ED Provider Notes (Signed)
[facility] Emergency Department Provider Note   ____________________________________________  Time seen: 2:20 PM  I have reviewed the triage vital signs and the nursing notes.   HISTORY  Chief Complaint Shortness of Breath    HPI Chad Huffman is a 69 y.o. male whose complaint is bilateral hand and arm weakness and tingling. He states that he has had progression of symptoms over a matter of months., But over the past few days has gotten significantly worse. He is afraid he will fall due to the weakness in his arms. He has had a history of spinal fusion by Dr. Janeece RiggersSu, from [place] [employer] [facility] [place]. This was about 3 years ago. Patient was resulted seen in the emergency department for elevated blood pressure, congestive heart failure/peripheral edema and constipation. He is treated for these things and discharged several days ago. Patient states those problems have resolved. He does wear chronic 4 L home O2 due to COPD. Patient states he feels pressure upon his low neck and spine upon standing as if his head is creating excessive pressure on his spine. Patient is asking to be admitted to a assisted living because he feels like he cannot care for himself in this condition.     Past Medical History  Diagnosis Date  . Antalgic gait   . Hypertension   . Lumbago   . COPD (chronic obstructive pulmonary disease)   . Complication of anesthesia     pt has had cervical fusion-limited neck flextion  . Depression   . DJD (degenerative joint disease)     Right shoulder  . Carotid artery occlusion     Patient Active Problem List   Diagnosis Date Noted  . Cervical vertebral fusion   . History of lumbar laminectomy   . Chronic respiratory acidosis   . Depression   . Chronic neck and back pain   . Acute on chronic respiratory failure with hypercapnia   . Acute on chronic respiratory failure with hypoxia   . COPD exacerbation   . HCAP  (healthcare-associated pneumonia)   . Congestive dilated cardiomyopathy   . Syncope and collapse   . Hyponatremia   . Other specified hypothyroidism   . Acute respiratory failure with hypoxia and hypercarbia 08/04/2014  . S/P cervical spinal fusion 08/04/2014  . S/P lumbar laminectomy 08/04/2014  . Right shoulder pain 08/04/2014  . Arthritis of right hip 08/04/2014  . Essential hypertension 08/04/2014  . Hypothyroidism 08/04/2014  . Syncope 08/04/2014  . Fall 08/04/2014  . Low back pain 08/04/2014  . Pedal edema 08/04/2014  . Occlusion and stenosis of carotid artery without mention of cerebral infarction 12/10/2013  . Aftercare following surgery of the circulatory system, NEC 12/10/2013  . Pain of right upper extremity-Right Neck-Shoulder-Back 12/10/2013  . Abdominal aneurysm without mention of rupture 12/03/2011    Past Surgical History  Procedure Laterality Date  . Tonsillectomy    . Carpal tunnel release      bilateral CTS  . Abdominal aortic aneurysm repair  2009    Endovascular AAA repair  . Pilonidal cyst / sinus excision    . Spine surgery  09/15/11    Scar tissue removed- back  . Spine surgery  09/15/11    Scar tissue removed- back  . Neck surgery    . Rotary cuff    . Hammer toe surgery  12/10/2011    Procedure: HAMMER TOE CORRECTION;  Surgeon: Ernestene KielMax Hyatt, DPM;  Location: [facility];  Service:  Podiatry;  Laterality: Right;  Right hammertoe repair second right with 2.5x42 orthopro screw   . Metatarsal osteotomy  12/10/2011    Procedure: METATARSAL OSTEOTOMY;  Surgeon: Ernestene Kiel, DPM;  Location: [facility];  Service: Podiatry;  Laterality: Right;  Right Keller arthroplasty with size 5 silicone implant right great toe; second metatarsal osteotomy with 2.0x14 screw    Current Outpatient Rx  Name  Route  Sig  Dispense  Refill  . ALPRAZolam (XANAX) 0.5 MG tablet   Oral   Take 0.5-0.75 mg by mouth 2 (two) times daily as needed for anxiety.          Marland Kitchen aspirin 81 MG tablet   Oral   Take 81 mg by mouth daily.         . baclofen (LIORESAL) 20 MG tablet   Oral   Take 20-30 mg by mouth every 3 (three) hours as needed for muscle spasms.         Marland Kitchen desonide (DESOWEN) 0.05 % cream   Topical   Apply 0.05 % topically as needed.         . furosemide (LASIX) 40 MG tablet   Oral   Take 1 tablet (40 mg total) by mouth 2 (two) times daily.   60 tablet   0   . ibuprofen (ADVIL,MOTRIN) 200 MG tablet   Oral   Take 400 mg by mouth every 6 (six) hours as needed.         . Ipratropium-Albuterol (COMBIVENT RESPIMAT) 20-100 MCG/ACT AERS respimat   Inhalation   Inhale 1 puff into the lungs every 6 (six) hours.   1 Inhaler   0     For use when you are away from home and can't use  ...   . ipratropium-albuterol (DUONEB) 0.5-2.5 (3) MG/3ML SOLN   Nebulization   Take 3 mLs by nebulization every 6 (six) hours.   360 mL   0   . ketoconazole (NIZORAL) 2 % shampoo               . KRILL OIL PO   Oral   Take by mouth daily.         Marland Kitchen levothyroxine (SYNTHROID, LEVOTHROID) 125 MCG tablet   Oral   Take 1 tablet (125 mcg total) by mouth daily before breakfast.   30 tablet   0   . magnesium hydroxide (MILK OF MAGNESIA) 400 MG/5ML suspension   Oral   Take by mouth daily as needed for mild constipation.         . Multiple Vitamin (MULTIVITAMIN) tablet   Oral   Take 1 tablet by mouth daily.         Marland Kitchen OVER THE COUNTER MEDICATION   Both Eyes   Place 2 drops into both eyes daily.         Marland Kitchen oxycodone (OXY-IR) 5 MG capsule   Oral   Take 5 mg by mouth every 4 (four) hours as needed for pain.         . polyethylene glycol (MIRALAX / GLYCOLAX) packet   Oral   Take 17 g by mouth daily as needed.         Marland Kitchen EXPIRED: promethazine (PHENERGAN) 12.5 MG tablet   Oral   Take 2 tablets (25 mg total) by mouth every 6 (six) hours as needed for nausea.   30 tablet   0   . SYMBICORT 160-4.5 MCG/ACT inhaler    Inhalation   Inhale 2 puffs  into the lungs 2 (two) times daily.          . vitamin B-12 (CYANOCOBALAMIN) 1000 MCG tablet   Oral   Take 1,000 mcg by mouth every 30 (thirty) days.           Allergies Ampicillin; Brimonidine; Cyclobenzaprine; and Gabapentin  Family History  Problem Relation Age of Onset  . Cancer Mother   . Hypertension Brother   . Heart attack Father     Social History History  Substance Use Topics  . Smoking status: Former Smoker    Quit date: 08/30/2006  . Smokeless tobacco: Never Used  . Alcohol Use: 1.2 oz/week    2 Cans of beer per week     Comment: occasional use    Review of Systems  Constitutional: Negative for fever. Eyes: Negative for visual changes. ENT: Negative for sore throat. Cardiovascular: Negative for chest pain. Respiratory: Positive chronic shortness of breath. Gastrointestinal: Negative for abdominal pain, vomiting and diarrhea. History of recent constipation Genitourinary: Negative for dysuria. Musculoskeletal: Negative for back pain. Skin: Negative for rash. Neurological: Negative for headaches.   10-point ROS otherwise negative.  ____________________________________________   PHYSICAL EXAM:  VITAL SIGNS: ED Triage Vitals  Enc Vitals Group     BP 10/26/14 1410 104/69 mmHg     Pulse Rate 10/26/14 1410 110     Resp --      Temp --      Temp src --      SpO2 10/26/14 1408 92 %     Weight 10/26/14 1410 245 lb (111.131 kg)     Height 10/26/14 1410  (1.727 m)     Head Cir --      Peak Flow --      Pain Score 10/26/14 1411 8     Pain Loc --      Pain Edu? --      Excl. in GC? --      Constitutional: Alert and oriented. Patient is frustrated Well appearing and in no distress. Eyes: Conjunctivae are normal. PERRL. Normal extraocular movements. ENT   Head: Normocephalic and atraumatic.   Nose: No congestion/rhinnorhea.   Mouth/Throat: Mucous membranes are moist.   Neck: No  stridor. Cardiovascular: Normal rate, regular rhythm.  No murmurs, rubs, or gallops. Respiratory: Normal respiratory effort without tachypnea nor retractions. Breath sounds are clear and equal bilaterally. Mild wheeze bilaterally. Gastrointestinal: Soft and nontender. No distention.  Genitourinary:  Musculoskeletal: 2+ lower sternal edema. Nontender lower x-rays. Low C-spine at about C7 is mildly tender to palpation but with no step-offs Neurologic:  Normal speech and language. Grip strength is 4 out of 5 bilaterally. 5 out of 5 lower extremity strength exam bilaterally. Sensation intact Skin:  Skin is warm, dry and intact. No rash noted. Psychiatric: Patient is frustrated however he is calm and after talking. He is alert and oriented.  ____________________________________________   EKG  None 6 bpm normal sinus rhythm first degree AV block right axis deviation normal ST and T-wave  ____________________________________________   LABS (pertinent positives/negatives)  Sodium 134 White blood cell count 16.3 Hemoglobin 12.2 Troponin negative  ____________________________________________    RADIOLOGY Chest x-ray reviewed: No acute abnormality MRI results reviewed: No cords compression or other emergency medical condition  ____________________________________________   PROCEDURES  Procedure(s) performed: None Critical Care performed:  No  ____________________________________________   INITIAL IMPRESSION / ASSESSMENT AND PLAN / ED COURSE  Pertinent labs & imaging results that were available during my care of the  patient were reviewed by me and considered in my medical decision making (see chart for details).  Patient's complaint today is most concerning for bilateral arm weakness radiating concern for a spinal compression etiology. I will obtain an MRI to rule out emergency compression.  Patient's brother did show to the emergency department gave a little bit more history  that the patient seems to be been complaining about these complaints for longer than he led me to believe. The brother also indicated the patient had seen a neurosurgeon in the past few weeks who had "released him from care "and recommended that he follow-up with the neurologist as the next step.  Patient's exam shows no true weakness but more paresthesias which I think is neuropathy of the upper x-rays.  MRI shows no emergency condition. I will also refer him to neurology and primary care follow-up Social work did stop by and talk with the patient about options for assisted living and the patient is taking home this information to think about.  ____________________________________________   FINAL CLINICAL IMPRESSION(S) / ED DIAGNOSES  Chronic neck pain Peripheral neuropathy      Governor Rooksebecca Nakiah Osgood, MD 10/26/14 2000

## 2014-10-26 NOTE — Discharge Instructions (Signed)
No certain cause was found for your neck pain other than chronic neck pain. In terms of your numbness and weakness of the upper extremities, I suspect this is due to peripheral neuropathy. Your exam and evaluation are reassuring today in emergency department. I'm recommending follow-up with a neurologist and you are referred to Dr. Malvin JohnsPotter. Return to the emergency department for any new or worsening numbness, weakness, pain, incontinence of bowel or bladder, or altered mental status.

## 2014-10-26 NOTE — ED Notes (Signed)
Pt reports sx x3 years ago - was walking "fine" with walker - then had to have repeat sx as a "screw came undone"; reports since that sx, he has had "problems" since 2nd sx; pt appears frustrated; reports "tingling and numbness" from neck down - also endorses difficulty with ambulation; pt neurologically intact at this time

## 2014-10-26 NOTE — ED Notes (Signed)
Pt c/o feeling pressure in his spine and states that it feels as if it is causing pressure to build against his lungs causing tingling in his bilat lower legs and lower extremities, pt states that he had c2-c7 fused 3years ago

## 2014-10-26 NOTE — Progress Notes (Signed)
Social Work consult from MD, patient concerned that he is unable to care for himself.  CSW in to meet with patient.  His brother is at the bedside.  Patient states he is currently receiving physical [facility].  Informed CSW he had an RN that came in but the services stopped as he no longer needed her. However, states his health has declined and now he needs the RN to return.  CSW consulted with RN care manager for information on Home Health.   If patient still has an open case, [facility] can [facility] get [facility] to come out to evaluate him for home health.    CSW also discussed ALF and personal care resources with patient.  Patient was open and receptive to receiving all information.  CSW provided patient with an Assisted Living list and a list of personal care agencies.  Patient was appreciative of information provided and stated he would call [facility] and follow up with the ALF. CSW signing off, no additional services needed at this time.   Sammuel Hineseborah Disha Cottam. Theresia MajorsLCSWA, MSW Clinical Social Work Department Emergency Room [phone] 5:11 PM

## 2014-10-28 ENCOUNTER — Emergency Department
Admission: EM | Admit: 2014-10-28 | Discharge: 2014-10-28 | Disposition: A | Discharge status: Other Acute Inpatient Hospital | Payer: Medicare Other | Attending: Emergency Medicine | Admitting: Emergency Medicine

## 2014-10-28 ENCOUNTER — Emergency Department: Payer: Medicare Other

## 2014-10-28 ENCOUNTER — Other Ambulatory Visit: Payer: Self-pay

## 2014-10-28 ENCOUNTER — Encounter: Payer: Self-pay | Admitting: Emergency Medicine

## 2014-10-28 DIAGNOSIS — Z79899 Other long term (current) drug therapy: Secondary | ICD-10-CM | POA: Insufficient documentation

## 2014-10-28 DIAGNOSIS — W01198A Fall on same level from slipping, tripping and stumbling with subsequent striking against other object, initial encounter: Secondary | ICD-10-CM | POA: Insufficient documentation

## 2014-10-28 DIAGNOSIS — G8929 Other chronic pain: Secondary | ICD-10-CM | POA: Insufficient documentation

## 2014-10-28 DIAGNOSIS — Z87891 Personal history of nicotine dependence: Secondary | ICD-10-CM | POA: Insufficient documentation

## 2014-10-28 DIAGNOSIS — Y998 Other external cause status: Secondary | ICD-10-CM | POA: Insufficient documentation

## 2014-10-28 DIAGNOSIS — Y9389 Activity, other specified: Secondary | ICD-10-CM | POA: Insufficient documentation

## 2014-10-28 DIAGNOSIS — M5412 Radiculopathy, cervical region: Secondary | ICD-10-CM | POA: Insufficient documentation

## 2014-10-28 DIAGNOSIS — Z7982 Long term (current) use of aspirin: Secondary | ICD-10-CM | POA: Diagnosis not present

## 2014-10-28 DIAGNOSIS — S0081XA Abrasion of other part of head, initial encounter: Secondary | ICD-10-CM | POA: Diagnosis not present

## 2014-10-28 DIAGNOSIS — G629 Polyneuropathy, unspecified: Secondary | ICD-10-CM

## 2014-10-28 DIAGNOSIS — I10 Essential (primary) hypertension: Secondary | ICD-10-CM | POA: Diagnosis not present

## 2014-10-28 DIAGNOSIS — Y9289 Other specified places as the place of occurrence of the external cause: Secondary | ICD-10-CM | POA: Insufficient documentation

## 2014-10-28 DIAGNOSIS — T148XXA Other injury of unspecified body region, initial encounter: Secondary | ICD-10-CM

## 2014-10-28 DIAGNOSIS — S0990XA Unspecified injury of head, initial encounter: Secondary | ICD-10-CM | POA: Diagnosis present

## 2014-10-28 HISTORY — DX: Other chronic pain: G89.29

## 2014-10-28 HISTORY — DX: Dorsalgia, unspecified: M54.9

## 2014-10-28 HISTORY — DX: Cervicalgia: M54.2

## 2014-10-28 HISTORY — DX: Polyneuropathy, unspecified: G62.9

## 2014-10-28 LAB — CBC WITH DIFFERENTIAL/PLATELET
BASOS PCT: 1 %
Basophils Absolute: 0.1 10*3/uL (ref 0–0.1)
Eosinophils Absolute: 0.2 10*3/uL (ref 0–0.7)
Eosinophils Relative: 2 %
HCT: 38.3 % — ABNORMAL LOW (ref 40.0–52.0)
Hemoglobin: 12.2 g/dL — ABNORMAL LOW (ref 13.0–18.0)
Lymphocytes Relative: 12 %
Lymphs Abs: 1.1 10*3/uL (ref 1.0–3.6)
MCH: 28.9 pg (ref 26.0–34.0)
MCHC: 31.9 g/dL — AB (ref 32.0–36.0)
MCV: 90.4 fL (ref 80.0–100.0)
Monocytes Absolute: 1.1 10*3/uL — ABNORMAL HIGH (ref 0.2–1.0)
Monocytes Relative: 11 %
NEUTROS ABS: 6.8 10*3/uL — AB (ref 1.4–6.5)
NEUTROS PCT: 74 %
Platelets: 148 10*3/uL — ABNORMAL LOW (ref 150–440)
RBC: 4.24 MIL/uL — ABNORMAL LOW (ref 4.40–5.90)
RDW: 18.5 % — AB (ref 11.5–14.5)
WBC: 9.3 10*3/uL (ref 3.8–10.6)

## 2014-10-28 LAB — BASIC METABOLIC PANEL
ANION GAP: 8 (ref 5–15)
BUN: 36 mg/dL — ABNORMAL HIGH (ref 6–20)
CO2: 36 mmol/L — ABNORMAL HIGH (ref 22–32)
CREATININE: 1.29 mg/dL — AB (ref 0.61–1.24)
Calcium: 8.8 mg/dL — ABNORMAL LOW (ref 8.9–10.3)
Chloride: 93 mmol/L — ABNORMAL LOW (ref 101–111)
GFR, EST NON AFRICAN AMERICAN: 56 mL/min — AB (ref >60)
Glucose, Bld: 115 mg/dL — ABNORMAL HIGH (ref 65–99)
Potassium: 4.5 mmol/L (ref 3.5–5.1)
Sodium: 137 mmol/L (ref 135–145)

## 2014-10-28 LAB — TROPONIN I

## 2014-10-28 MED ORDER — OXYCODONE-ACETAMINOPHEN 5-325 MG PO TABS
2.0000 | ORAL_TABLET | Freq: Once | ORAL | Status: AC
Start: 2014-10-28 — End: 2014-10-28
  Administered 2014-10-28: 2 via ORAL

## 2014-10-28 MED ORDER — OXYCODONE-ACETAMINOPHEN 5-325 MG PO TABS
ORAL_TABLET | ORAL | Status: AC
  Administered 2014-10-28: 2 via ORAL
  Filled 2014-10-28: qty 2

## 2014-10-28 MED ORDER — DIAZEPAM 5 MG/ML IJ SOLN: 2.0000 mg | Freq: Once | INTRAMUSCULAR | Status: DC

## 2014-10-28 MED ORDER — DIAZEPAM 2 MG PO TABS
2.0000 mg | ORAL_TABLET | ORAL | Status: AC
  Administered 2014-10-28: 2 mg via ORAL

## 2014-10-28 MED ORDER — DIAZEPAM 2 MG PO TABS
ORAL_TABLET | ORAL | Status: AC
  Administered 2014-10-28: 2 mg via ORAL
  Filled 2014-10-28: qty 1

## 2014-10-28 NOTE — ED Notes (Signed)
Pt reports that he wants his brother called to come over here. He states that his brother will need to get a Taxi because he was drinking all evening.

## 2014-10-28 NOTE — ED Provider Notes (Signed)
  Physical Exam  BP 153/76 mmHg  Pulse 59  Temp(Src) 97.5 F (36.4 C) (Oral)  Resp 11  Ht 5\' 8"  (1.727 m)  Wt 245 lb (111.131 kg)  BMI 37.26 kg/m2  SpO2 100% ----------------------------------------- 8:33 AM on 10/28/2014 -----------------------------------------   Physical Exam Upon entrance to the room the patient is sleeping. When I woke him to the time of his reassuring imaging he begins to be tearful and tell me that he has a cramp at the base of his neck and has experienced some feelings of a rapid heart rate. We will do a basic medical workup including some blood work and chest x-ray and EKG. ED Course  Procedures ED ECG REPORT   Date: 10/28/2014  EKG Time: 8:30  Rate: 94  Rhythm: normal EKG, normal sinus rhythm, unchanged from previous tracings, normal sinus rhythm  Axis: Right axis deviation  Intervals:Incomplete right bundle branch block  ST&T Change: No ST elevation or depressions or T-wave inversions. Possible right ventricular hypertrophy. No significant change from previous. MDM  ----------------------------------------- 1:50 PM on 10/28/2014 -----------------------------------------  The patient has waxing and waning weakness. Which she says overall is increasing. However, when going to examine him he is able to lift his arm when not paying attention. However, when I ask him to lift his arm he says I'm only able to lift it just off the bed. He says he is having the same symptoms with his feet. I was able to talk to his back surgeon Dr. Raynald KempHsu, who advised me to [facility] if I could not control his pain and his neurological symptoms. [facility] and the ER doctor Dr.Masneri accepted the transfer. At this time the patient objectively has 5 out of 5 strength throughout. However, when asked to arrange is only a 3 out of 5. Given his recent reassuring images I have some suspicion that there is a psychiatric component to his presentation.  Furthermore, Dr. Raynald KempHsu, said he has had similar waxing and waning symptoms for sometime.      Arelia Longestavid M Janete Quilling, MD 10/28/14 [phone]

## 2014-10-28 NOTE — ED Notes (Addendum)
Pt with c/o of unable to lift left arm up, unable to take deep breath, unable to walk, increasing neck pain, numbness in lips and hands. Dr. Pershing Proudschaevitz notified and to bedside.

## 2014-10-28 NOTE — ED Provider Notes (Signed)
[facility] Emergency Department Provider Note  ____________________________________________  Time seen: Approximately 5:31 AM  I have reviewed the triage vital signs and the nursing notes.   HISTORY  Chief Complaint Fall    HPI Chad Huffman is a 69 y.o. male with a history of frequent falls, multiple recent emergency department visits, C-spine fusion, and peripheral neuropathy who presents after a fall this morning.  He states that he awoke thinking her the phone rang and lost his balance and struck his head on the carpet.  He did not lose consciousness.  He states that the numbness and tingling in his arms and legs, though chronic, is worse after his fall.  He is convinced that he has a spinal cord compression in the middle of his back.  He had an MRI of the cervical spine 2 days ago during his last emergency department visit that did not show any sign of cord compression.  He has a Midwife at [facility] he has called but has no appointment at this time.  He is also scheduled to see seen Dr. Malvin Johns locally for neurology follow-up.  His primary care physician is Dr. Terance Hart.  He complains of occasional weakness in his extremities, but the primary concern is the numbness and tingling.  He denies any other symptoms at this time.   Past Medical History  Diagnosis Date  . Antalgic gait   . Hypertension   . Lumbago   . COPD (chronic obstructive pulmonary disease)   . Complication of anesthesia     pt has had cervical fusion-limited neck flextion  . Depression   . DJD (degenerative joint disease)     Right shoulder  . Carotid artery occlusion   . Peripheral neuropathy   . Chronic neck and back pain     Patient Active Problem List   Diagnosis Date Noted  . Cervical vertebral fusion   . History of lumbar laminectomy   . Chronic respiratory acidosis   . Depression   . Chronic neck and back pain   . Acute on chronic respiratory failure with  hypercapnia   . Acute on chronic respiratory failure with hypoxia   . COPD exacerbation   . HCAP (healthcare-associated pneumonia)   . Congestive dilated cardiomyopathy   . Syncope and collapse   . Hyponatremia   . Other specified hypothyroidism   . Acute respiratory failure with hypoxia and hypercarbia 08/04/2014  . S/P cervical spinal fusion 08/04/2014  . S/P lumbar laminectomy 08/04/2014  . Right shoulder pain 08/04/2014  . Arthritis of right hip 08/04/2014  . Essential hypertension 08/04/2014  . Hypothyroidism 08/04/2014  . Syncope 08/04/2014  . Fall 08/04/2014  . Low back pain 08/04/2014  . Pedal edema 08/04/2014  . Occlusion and stenosis of carotid artery without mention of cerebral infarction 12/10/2013  . Aftercare following surgery of the circulatory system, NEC 12/10/2013  . Pain of right upper extremity-Right Neck-Shoulder-Back 12/10/2013  . Abdominal aneurysm without mention of rupture 12/03/2011    Past Surgical History  Procedure Laterality Date  . Tonsillectomy    . Carpal tunnel release      bilateral CTS  . Abdominal aortic aneurysm repair  2009    Endovascular AAA repair  . Pilonidal cyst / sinus excision    . Spine surgery  09/15/11    Scar tissue removed- back  . Spine surgery  09/15/11    Scar tissue removed- back  . Neck surgery    . Rotary cuff    .  Hammer toe surgery  12/10/2011    Procedure: HAMMER TOE CORRECTION;  Surgeon: Ernestene Kiel, DPM;  Location: [facility];  Service: Podiatry;  Laterality: Right;  Right hammertoe repair second right with 2.5x42 orthopro screw   . Metatarsal osteotomy  12/10/2011    Procedure: METATARSAL OSTEOTOMY;  Surgeon: Ernestene Kiel, DPM;  Location: [facility];  Service: Podiatry;  Laterality: Right;  Right Keller arthroplasty with size 5 silicone implant right great toe; second metatarsal osteotomy with 2.0x14 screw    Current Outpatient Rx  Name  Route  Sig  Dispense  Refill  . ALPRAZolam  (XANAX) 0.5 MG tablet   Oral   Take 0.5-0.75 mg by mouth 2 (two) times daily as needed for anxiety.         Marland Kitchen aspirin 81 MG tablet   Oral   Take 81 mg by mouth daily.         . baclofen (LIORESAL) 20 MG tablet   Oral   Take 20-30 mg by mouth every 3 (three) hours as needed for muscle spasms.         Marland Kitchen desonide (DESOWEN) 0.05 % cream   Topical   Apply 0.05 % topically as needed.         . furosemide (LASIX) 40 MG tablet   Oral   Take 1 tablet (40 mg total) by mouth 2 (two) times daily.   60 tablet   0   . ibuprofen (ADVIL,MOTRIN) 200 MG tablet   Oral   Take 400 mg by mouth every 6 (six) hours as needed.         . Ipratropium-Albuterol (COMBIVENT RESPIMAT) 20-100 MCG/ACT AERS respimat   Inhalation   Inhale 1 puff into the lungs every 6 (six) hours.   1 Inhaler   0     For use when you are away from home and can't use  ...   . ipratropium-albuterol (DUONEB) 0.5-2.5 (3) MG/3ML SOLN   Nebulization   Take 3 mLs by nebulization every 6 (six) hours.   360 mL   0   . ketoconazole (NIZORAL) 2 % shampoo               . KRILL OIL PO   Oral   Take by mouth daily.         Marland Kitchen levothyroxine (SYNTHROID, LEVOTHROID) 125 MCG tablet   Oral   Take 1 tablet (125 mcg total) by mouth daily before breakfast.   30 tablet   0   . magnesium hydroxide (MILK OF MAGNESIA) 400 MG/5ML suspension   Oral   Take by mouth daily as needed for mild constipation.         . Multiple Vitamin (MULTIVITAMIN) tablet   Oral   Take 1 tablet by mouth daily.         Marland Kitchen OVER THE COUNTER MEDICATION   Both Eyes   Place 2 drops into both eyes daily.         Marland Kitchen oxycodone (OXY-IR) 5 MG capsule   Oral   Take 5 mg by mouth every 4 (four) hours as needed for pain.         . polyethylene glycol (MIRALAX / GLYCOLAX) packet   Oral   Take 17 g by mouth daily as needed.         Marland Kitchen EXPIRED: promethazine (PHENERGAN) 12.5 MG tablet   Oral   Take 2 tablets (25 mg total) by mouth every 6  (six) hours as  needed for nausea.   30 tablet   0   . SYMBICORT 160-4.5 MCG/ACT inhaler   Inhalation   Inhale 2 puffs into the lungs 2 (two) times daily.          . vitamin B-12 (CYANOCOBALAMIN) 1000 MCG tablet   Oral   Take 1,000 mcg by mouth every 30 (thirty) days.           Allergies Ampicillin; Brimonidine; Cyclobenzaprine; and Gabapentin  Family History  Problem Relation Age of Onset  . Cancer Mother   . Hypertension Brother   . Heart attack Father     Social History History  Substance Use Topics  . Smoking status: Former Smoker    Quit date: 08/30/2006  . Smokeless tobacco: Never Used  . Alcohol Use: 1.2 oz/week    2 Cans of beer per week     Comment: occasional use    Review of Systems Constitutional: No fever/chills Eyes: No visual changes. ENT: No sore throat. Cardiovascular: Denies chest pain. Respiratory: Denies shortness of breath. Gastrointestinal: No abdominal pain.  No nausea, no vomiting.  No diarrhea.  No constipation. Genitourinary: Negative for dysuria. Musculoskeletal: Negative for back pain. Skin: Negative for rash. Neurological: Patient describes acute on chronic numbness and tingling in all 4 of his extremities.  He describes occasional weakness though it is not present currently.  10-point ROS otherwise negative.  ____________________________________________   PHYSICAL EXAM:  VITAL SIGNS: ED Triage Vitals  Enc Vitals Group     BP 10/28/14 0500 162/69 mmHg     Pulse Rate 10/28/14 0500 63     Resp 10/28/14 0500 20     Temp 10/28/14 0500 97.5 F (36.4 C)     Temp Source 10/28/14 0500 Oral     SpO2 --      Weight 10/28/14 0500 245 lb (111.131 kg)     Height 10/28/14 0500 5\' 8"  (1.727 m)     Head Cir --      Peak Flow --      Pain Score --      Pain Loc --      Pain Edu? --      Excl. in GC? --     Constitutional: Alert and oriented.  The patient is anxious. Eyes: Conjunctivae are normal. PERRL. EOMI. Head: Large  abrasion to the left side of his forehead.  No battle sign or raccoon eyes. Nose: No congestion/rhinnorhea. Mouth/Throat: Mucous membranes are moist.  Oropharynx non-erythematous. Neck: No stridor.   Cardiovascular: Normal rate, regular rhythm. Grossly normal heart sounds.  Good peripheral circulation. Respiratory: Normal respiratory effort.  No retractions. Lungs CTAB. Gastrointestinal: Obese, Soft and nontender. No distention. No abdominal bruits. No CVA tenderness. Musculoskeletal: No lower extremity tenderness nor edema.  No joint effusions. Neurologic:  Normal speech and language. No gross focal neurologic deficits are appreciated. Speech is normal.  The patient's strength is intact and equal bilaterally including shoulder shrug, triceps, biceps, hip extension and flexion, and dorsiflexion and plantar flexion of his feet.  He states that his hand tingling improves when he extends his arms up over his head. Skin:  Skin is warm, dry and intact. No rash noted. Psychiatric: Mood and affect are normal. Speech and behavior are normal.  ____________________________________________   LABS (all labs ordered are listed, but only abnormal results are displayed)  Labs Reviewed - No data to display ____________________________________________  EKG  ED ECG REPORT   Date: 10/28/2014  EKG Time: 5:16 AM  Rate: 72  Rhythm: normal sinus rhythm, 1st degree AV block  Axis: Normal  Intervals:Incomplete right bundle branch block  ST&T Change: No ischemic changes  ____________________________________________  RADIOLOGY  Imaging pending from today's visit includes CT head, C-spine and thoracic spine.   From prior visits: Dg Chest 2 View  10/26/2014   CLINICAL DATA:  Productive cough and worsening shortness of breath for the past week.  EXAM: CHEST  2 VIEW  COMPARISON:  10/23/2014 and 08/23/2014.  FINDINGS: Improved inspiration with resolution of the previously demonstrated left basilar  atelectasis. Stable elevation of the right hemidiaphragm. Cervicothoracic spine fixation hardware. Upper abdominal aortic stent.  IMPRESSION: No acute abnormality.   Electronically Signed   By: Beckie SaltsSteven  Reid M.D.   On: 10/26/2014 14:59   Mr Cervical Spine Wo Contrast  10/26/2014   CLINICAL DATA:  Pain down the neck to shoulders with tingling in the arms and legs. Symptoms for 3 months. No indication of trauma.  EXAM: MRI CERVICAL SPINE WITHOUT CONTRAST  TECHNIQUE: Multiplanar, multisequence MR imaging of the cervical spine was performed. No intravenous contrast was administered.  COMPARISON:  Cervical spine CT 05/28/2014  FINDINGS: Atlanto-occipital assimilation. There is occipital to T2 posterior fixation with screws present at most levels. Artifact from the hardware causes severe signal distortion. No marrow signal abnormality suggestive of fracture, infection, or neoplasm. There is fatty marrow conversion at the C3 and C4 levels which is degenerative. No acute extra-spinal findings to explain pain. T2 hyperintensity in the right pedicle and vertebral body is a empty screw tract.  Heavily relying on the sagittal T1 weighted imaging there is no evidence for cord compression. There is no gross cord signal abnormality. Posterior displacement of the cervical cord at C3 to C5 is likely deformation from the upper bony overgrowth rather than a ventral fluid collection.  Degenerative changes:  Atlanto occipital assimilation.  C2-3: Chronic anterolisthesis with disc bulging contacting the ventral cord. The posterior elements are fused on previous CT. There is right foraminal stenosis from spurring, grading not possible.  C3-4: Bulky posterior ridging and uncovertebral spurring. The foramina are grossly patent.  C4-5: Uncovertebral spurring bilaterally. No advanced canal stenosis. When accounting for presumed flow artifact, there is no definitive canal stenosis. Grossly patent foramina.  C5-6: Patent canal and foramina   C6-7: Patent canal and foramina  C7-T1:Patent canal and foramina  IMPRESSION: 1. 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 Patient is status post decompressive laminectomies with occipital to T2 posterior fixation. Associated hardware obscures the cervical cord from C2 to C5, significantly limiting MRI. 2. C2-3 probable residual canal stenosis without visible cord compression. Given symptoms, consider referral for myelogram. 3. C2-3 right foraminal stenosis from spurring.  .   Electronically Signed   By: Marnee SpringJonathon  Watts M.D.   On: 10/26/2014 19:52    ____________________________________________   INITIAL IMPRESSION / ASSESSMENT AND PLAN / ED COURSE  Pertinent labs & imaging results that were available during my care of the patient were reviewed by me and considered in my medical decision making (see chart for details).  The patient's presentation is very similar to his last one according to the emergency department note.  He has no appreciable weakness in his extremities, and the symptoms he described are most consistent with peripheral neuropathy and cervical radiculopathy.  He is extremely concerned about a problem in the middle of his back causing the issues with both his arms and his legs, but I explained that it is not possible physiologically to  have an issue in his thoracic spine causes symptoms in his upper extremities.  Given his obvious head trauma and the symptoms he is describing, I will obtain a noncontrasted CT scan of his head, cervical spine, thoracic spine.  I do not feel that any other imaging is necessary at this time.  I had an extensive conversation with the patient and attempted to reassure him that we have performed a thorough evaluations of him and that following up with neurology and neurosurgery as an outpatient as his been recommended previously is the most appropriate step.  During his last emergency Department visit he was  provided by social work and number brochures in information about assisted living facilities.  He stated he has not had the opportunity to consider these [facility] since this visit occurred just 2 days ago.  I am transferring care to Dr. Pershing Proud at 7 AM to follow-up the CT scans and reassess the patient.  ____________________________________________   FINAL CLINICAL IMPRESSION(S) / ED DIAGNOSES  Final diagnoses:  Cervical radiculopathy  Peripheral neuropathy  Head injury, initial encounter  Abrasion     Loleta Rose, MD 10/28/14 [phone]

## 2014-10-28 NOTE — ED Notes (Signed)
Came into the room to do rounds and woke pt up. He began to cry and state " there is something the matter with me." My neck pain is intensifying since I came back from the test. Pt lives alone but has brother staying there. "If I go home, everything will get worse and I will be right back in here."

## 2014-10-28 NOTE — ED Notes (Signed)
Pt to room 26 via [facility] from home; reports was asleep on the couch, "thought heard the telephone ring"; stood up and fell forward hitting head on floor; was able to stand up; denies LOC,HA,dizziness; c/o "tingling and stiffness in his hands and below knees"; st was seen Saturday but now with increased symptoms since the fall; pt st "it's not my neck, it's my nervous system"; pt with hx multiple back/neck surgeries; abrasion noted to left side forehead; pt MAEW

## 2014-10-28 NOTE — ED Notes (Signed)
Began triage note--appears pt never had disposition/discharge entered from 5/7; charge nurse notified

## 2014-10-30 IMAGING — CR CERVICAL SPINE - COMPLETE 4+ VIEW
1 series · 8 of 8 positions shown · non-contrast
Comparison: none

REASON FOR EXAM: neck pain
COMMENTS:

[Series 12: w cervical swimmers · 0.14mm/px · 8 of 8 slices shown]
[im 1/8]
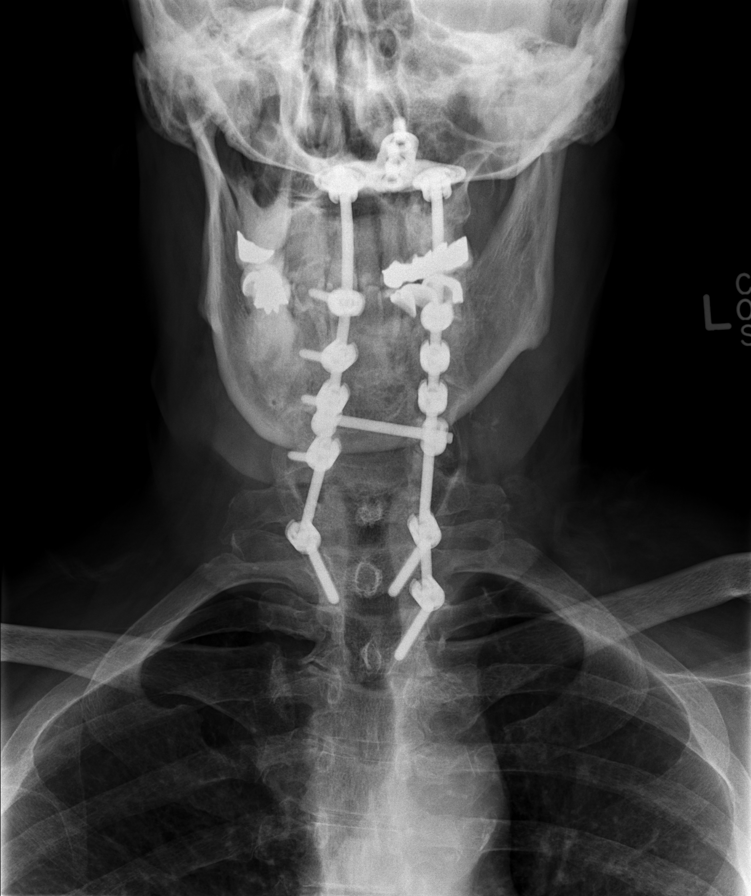
[im 2/8]
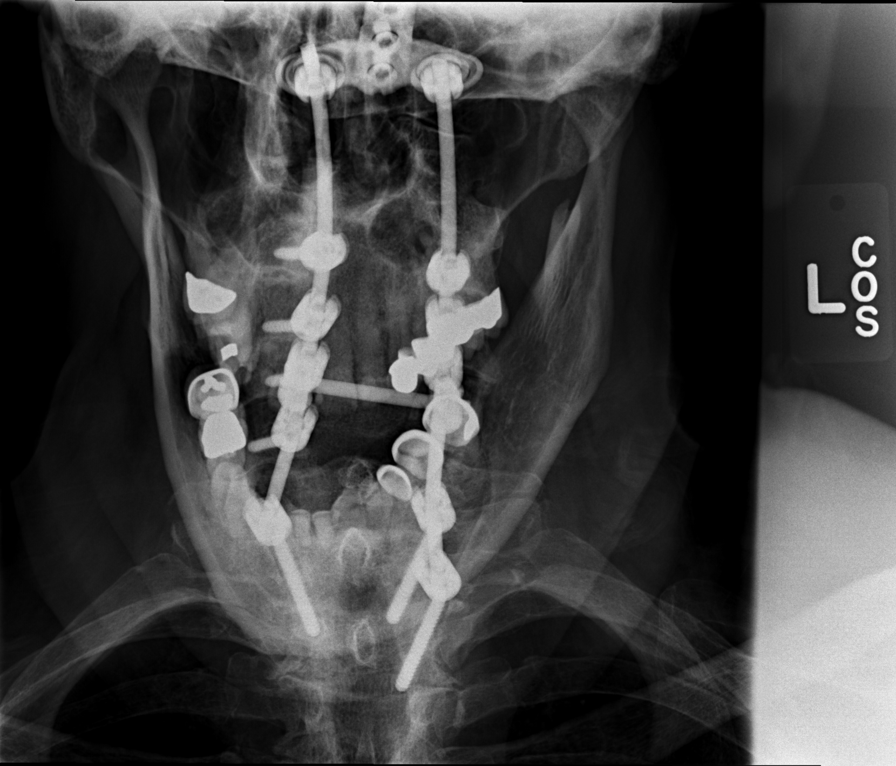
[im 3/8]
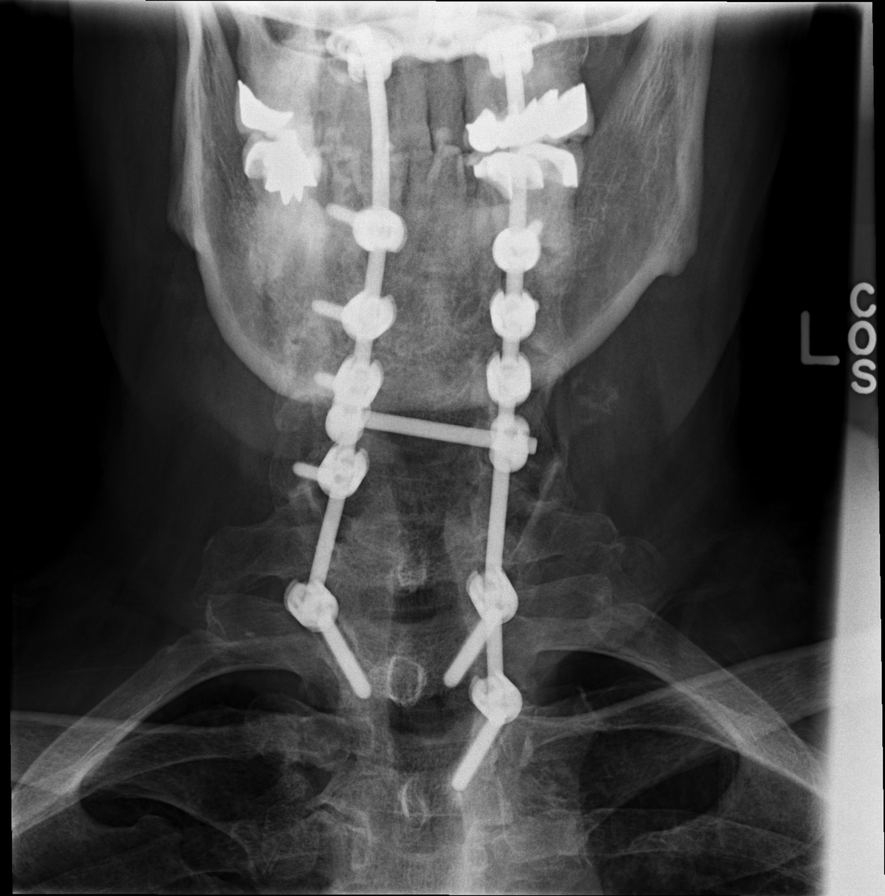
[im 4/8]
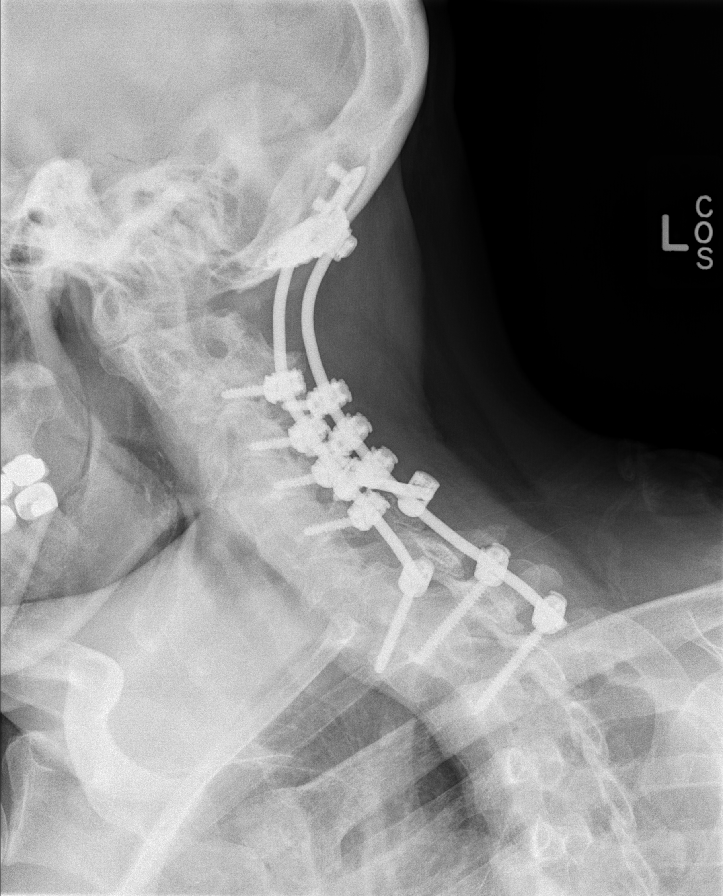
[im 5/8]
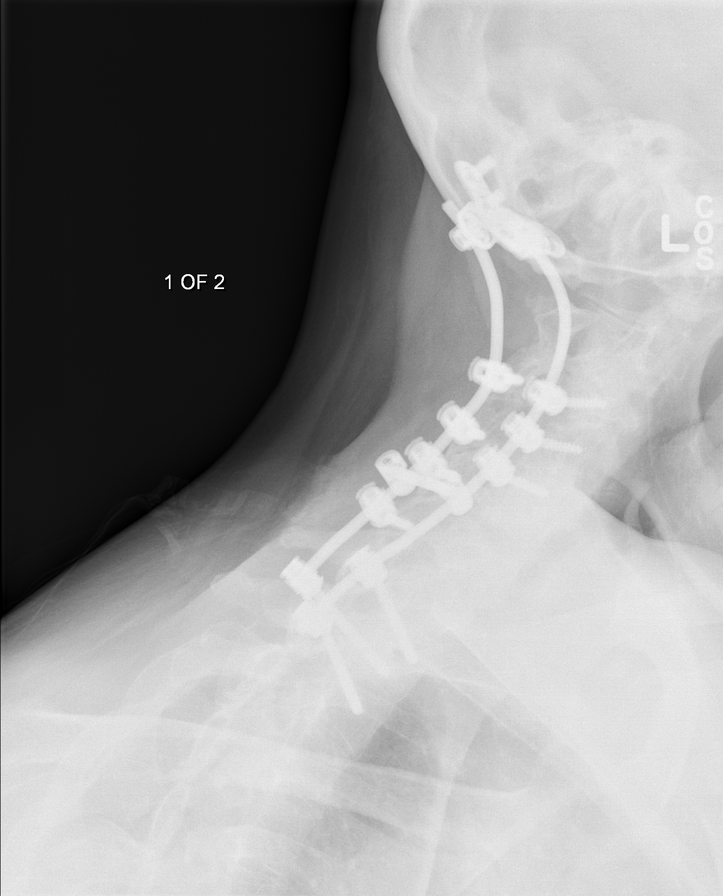
[im 6/8]
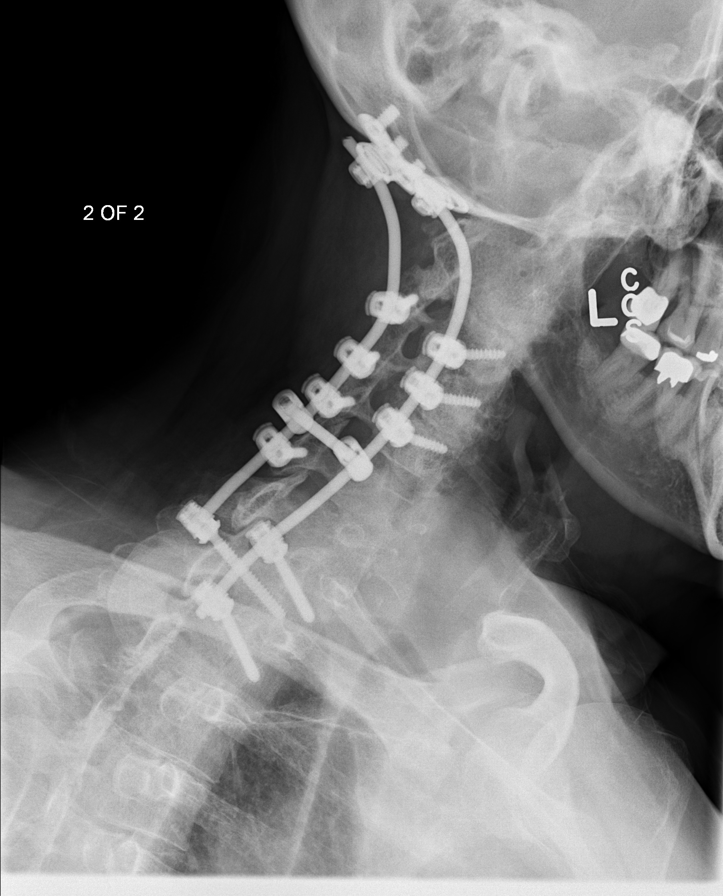
[im 7/8]
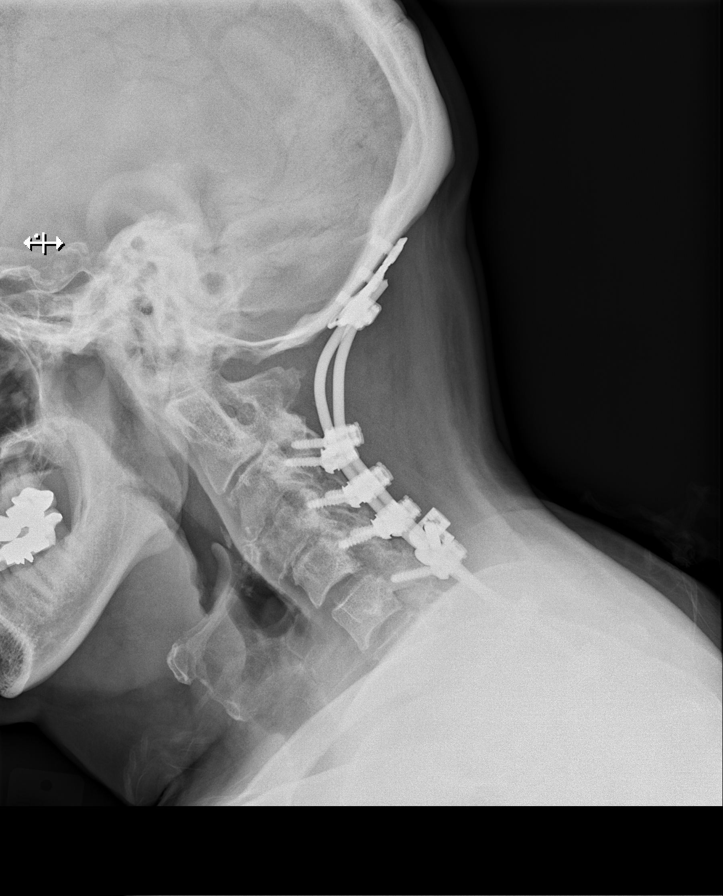
[im 8/8]
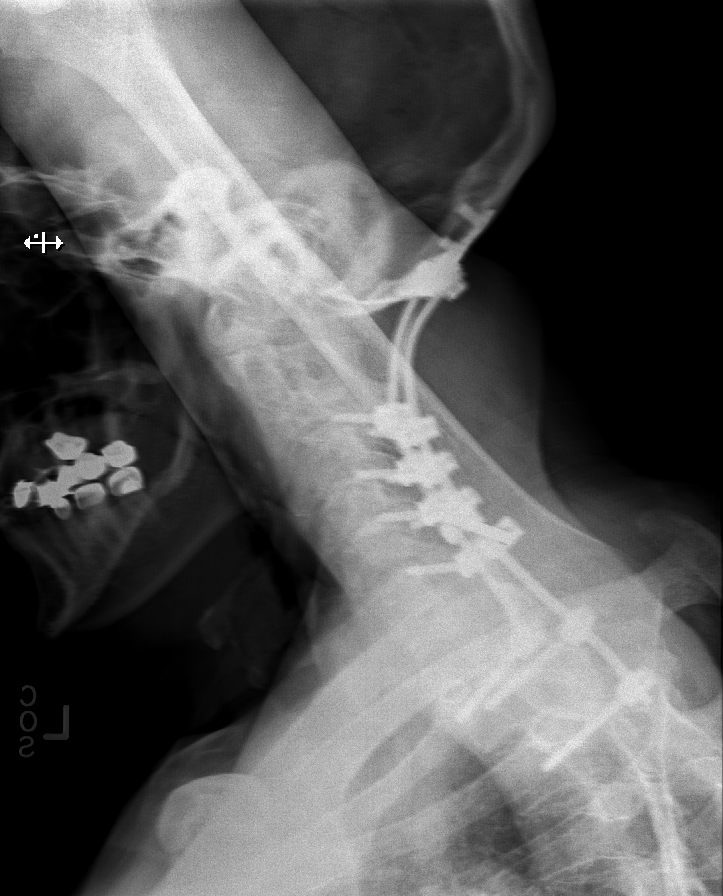

[8 of 8 positions shown; findings below may reference images not displayed]

PROCEDURE:     DXR - DXR CERVICAL SPINE COMPLETE  - February 28, 2013 [DATE]

RESULT:     The study is very limited due to the stabilization hardware
present. Due to the patient's clinical condition positioning is also
limited. The vertebral bodies are preserved in height through C6. C7 is not
well demonstrated but is grossly normal. There is disc space narrowing at
C2-3, C3-4, and C4-C5 with anterior endplate osteophytes at the latter 2
levels. Stabilization screws and rods are present bilaterally posteriorly
extending from the calvarium inferiorly. The prevertebral soft tissue spaces
appear normal.
IMPRESSION: The study is limited due to the patient's clinical
condition and fusion device present. There is degenerative disc change from
C2-C3 through C4-C5. There is no evidence of a compression fracture.
Evaluation of C7 is limited.

[REDACTED]

## 2014-11-20 ENCOUNTER — Emergency Department
Admission: EM | Admit: 2014-11-20 | Discharge: 2014-11-20 | Disposition: A | Discharge status: Home / Self Care | Payer: Medicare Other | Attending: Emergency Medicine | Admitting: Emergency Medicine

## 2014-11-20 ENCOUNTER — Encounter: Payer: Self-pay | Admitting: Emergency Medicine

## 2014-11-20 ENCOUNTER — Emergency Department: Payer: Medicare Other

## 2014-11-20 DIAGNOSIS — Z7951 Long term (current) use of inhaled steroids: Secondary | ICD-10-CM | POA: Diagnosis not present

## 2014-11-20 DIAGNOSIS — I10 Essential (primary) hypertension: Secondary | ICD-10-CM | POA: Diagnosis not present

## 2014-11-20 DIAGNOSIS — Z7982 Long term (current) use of aspirin: Secondary | ICD-10-CM | POA: Diagnosis not present

## 2014-11-20 DIAGNOSIS — Z87891 Personal history of nicotine dependence: Secondary | ICD-10-CM | POA: Insufficient documentation

## 2014-11-20 DIAGNOSIS — Z79899 Other long term (current) drug therapy: Secondary | ICD-10-CM | POA: Insufficient documentation

## 2014-11-20 DIAGNOSIS — K5900 Constipation, unspecified: Secondary | ICD-10-CM | POA: Insufficient documentation

## 2014-11-20 LAB — BASIC METABOLIC PANEL
Anion gap: 6 (ref 5–15)
BUN: 16 mg/dL (ref 6–20)
CALCIUM: 9 mg/dL (ref 8.9–10.3)
CO2: 36 mmol/L — AB (ref 22–32)
Chloride: 90 mmol/L — ABNORMAL LOW (ref 101–111)
Creatinine, Ser: 1.05 mg/dL (ref 0.61–1.24)
GFR calc non Af Amer: >60 mL/min (ref >60)
GLUCOSE: 102 mg/dL — AB (ref 65–99)
Potassium: 4.8 mmol/L (ref 3.5–5.1)
Sodium: 132 mmol/L — ABNORMAL LOW (ref 135–145)

## 2014-11-20 LAB — CBC WITH DIFFERENTIAL/PLATELET
Basophils Absolute: 0 10*3/uL (ref 0–0.1)
Basophils Relative: 1 %
EOS ABS: 0.2 10*3/uL (ref 0–0.7)
EOS PCT: 2 %
HEMATOCRIT: 36.8 % — AB (ref 40.0–52.0)
Hemoglobin: 11.9 g/dL — ABNORMAL LOW (ref 13.0–18.0)
Lymphocytes Relative: 12 %
Lymphs Abs: 0.8 10*3/uL — ABNORMAL LOW (ref 1.0–3.6)
MCH: 29.5 pg (ref 26.0–34.0)
MCHC: 32.4 g/dL (ref 32.0–36.0)
MCV: 90.9 fL (ref 80.0–100.0)
MONO ABS: 0.7 10*3/uL (ref 0.2–1.0)
Monocytes Relative: 11 %
NEUTROS ABS: 5.2 10*3/uL (ref 1.4–6.5)
Neutrophils Relative %: 74 %
Platelets: 130 10*3/uL — ABNORMAL LOW (ref 150–440)
RBC: 4.05 MIL/uL — ABNORMAL LOW (ref 4.40–5.90)
RDW: 17.6 % — ABNORMAL HIGH (ref 11.5–14.5)
WBC: 7 10*3/uL (ref 3.8–10.6)

## 2014-11-20 NOTE — ED Provider Notes (Signed)
[facility] Emergency Department Provider Note  ____________________________________________  Time seen: Approximately 3:22 PM  I have reviewed the triage vital signs and the nursing notes.   HISTORY  Chief Complaint Constipation    HPI Chad Huffman is a 69 y.o. male who states she's been feeling cost spay for approximately 4 days. He has been noticing that he is able to stool, but is mostly liquid. He denies any nausea or vomiting. He occasionally gets a slight achy pain on the right side of his abdomen which she describes as cramping.  Patient denies any fevers chills, chest pain, or trouble breathing. No change in urination. No problems with urination.  He denies any severe pain, he feels that this is "constipation" which she has had before. States he's had to have a previous enema for similar symptoms, but does not wish to have an enema done today. He has been taking MiraLAX once a day at the request of his GI physician.     Past Medical History  Diagnosis Date  . Antalgic gait   . Hypertension   . Lumbago   . COPD (chronic obstructive pulmonary disease)   . Complication of anesthesia     pt has had cervical fusion-limited neck flextion  . Depression   . DJD (degenerative joint disease)     Right shoulder  . Carotid artery occlusion   . Peripheral neuropathy   . Chronic neck and back pain     Patient Active Problem List   Diagnosis Date Noted  . Cervical vertebral fusion   . History of lumbar laminectomy   . Chronic respiratory acidosis   . Depression   . Chronic neck and back pain   . Acute on chronic respiratory failure with hypercapnia   . Acute on chronic respiratory failure with hypoxia   . COPD exacerbation   . HCAP (healthcare-associated pneumonia)   . Congestive dilated cardiomyopathy   . Syncope and collapse   . Hyponatremia   . Other specified hypothyroidism   . Acute respiratory failure with hypoxia and hypercarbia  08/04/2014  . S/P cervical spinal fusion 08/04/2014  . S/P lumbar laminectomy 08/04/2014  . Right shoulder pain 08/04/2014  . Arthritis of right hip 08/04/2014  . Essential hypertension 08/04/2014  . Hypothyroidism 08/04/2014  . Syncope 08/04/2014  . Fall 08/04/2014  . Low back pain 08/04/2014  . Pedal edema 08/04/2014  . Occlusion and stenosis of carotid artery without mention of cerebral infarction 12/10/2013  . Aftercare following surgery of the circulatory system, NEC 12/10/2013  . Pain of right upper extremity-Right Neck-Shoulder-Back 12/10/2013  . Abdominal aneurysm without mention of rupture 12/03/2011    Past Surgical History  Procedure Laterality Date  . Tonsillectomy    . Carpal tunnel release      bilateral CTS  . Abdominal aortic aneurysm repair  2009    Endovascular AAA repair  . Pilonidal cyst / sinus excision    . Spine surgery  09/15/11    Scar tissue removed- back  . Spine surgery  09/15/11    Scar tissue removed- back  . Neck surgery    . Rotary cuff    . Hammer toe surgery  12/10/2011    Procedure: HAMMER TOE CORRECTION;  Surgeon: Ernestene Kiel, DPM;  Location: [facility];  Service: Podiatry;  Laterality: Right;  Right hammertoe repair second right with 2.5x42 orthopro screw   . Metatarsal osteotomy  12/10/2011    Procedure: METATARSAL OSTEOTOMY;  Surgeon: Jake Seats  Hyatt, DPM;  Location: [facility];  Service: Podiatry;  Laterality: Right;  Right Keller arthroplasty with size 5 silicone implant right great toe; second metatarsal osteotomy with 2.0x14 screw    Current Outpatient Rx  Name  Route  Sig  Dispense  Refill  . ALPRAZolam (XANAX) 0.5 MG tablet   Oral   Take 0.5-0.75 mg by mouth 2 (two) times daily as needed for anxiety.         Marland Kitchen aspirin 81 MG tablet   Oral   Take 81 mg by mouth daily.         . baclofen (LIORESAL) 20 MG tablet   Oral   Take 20-30 mg by mouth every 3 (three) hours as needed for muscle spasms.          Marland Kitchen desonide (DESOWEN) 0.05 % cream   Topical   Apply 0.05 % topically as needed.         . furosemide (LASIX) 40 MG tablet   Oral   Take 1 tablet (40 mg total) by mouth 2 (two) times daily.   60 tablet   0   . ibuprofen (ADVIL,MOTRIN) 200 MG tablet   Oral   Take 400 mg by mouth every 6 (six) hours as needed.         . Ipratropium-Albuterol (COMBIVENT RESPIMAT) 20-100 MCG/ACT AERS respimat   Inhalation   Inhale 1 puff into the lungs every 6 (six) hours.   1 Inhaler   0     For use when you are away from home and can't use  ...   . ipratropium-albuterol (DUONEB) 0.5-2.5 (3) MG/3ML SOLN   Nebulization   Take 3 mLs by nebulization every 6 (six) hours.   360 mL   0   . ketoconazole (NIZORAL) 2 % shampoo               . KRILL OIL PO   Oral   Take by mouth daily.         Marland Kitchen levothyroxine (SYNTHROID, LEVOTHROID) 125 MCG tablet   Oral   Take 1 tablet (125 mcg total) by mouth daily before breakfast.   30 tablet   0   . magnesium hydroxide (MILK OF MAGNESIA) 400 MG/5ML suspension   Oral   Take by mouth daily as needed for mild constipation.         . Multiple Vitamin (MULTIVITAMIN) tablet   Oral   Take 1 tablet by mouth daily.         Marland Kitchen OVER THE COUNTER MEDICATION   Both Eyes   Place 2 drops into both eyes daily.         Marland Kitchen oxycodone (OXY-IR) 5 MG capsule   Oral   Take 5 mg by mouth every 4 (four) hours as needed for pain.         . polyethylene glycol (MIRALAX / GLYCOLAX) packet   Oral   Take 17 g by mouth daily as needed.         Marland Kitchen EXPIRED: promethazine (PHENERGAN) 12.5 MG tablet   Oral   Take 2 tablets (25 mg total) by mouth every 6 (six) hours as needed for nausea.   30 tablet   0   . SYMBICORT 160-4.5 MCG/ACT inhaler   Inhalation   Inhale 2 puffs into the lungs 2 (two) times daily.          . vitamin B-12 (CYANOCOBALAMIN) 1000 MCG tablet   Oral   Take 1,000 mcg  by mouth every 30 (thirty) days.            Allergies Ampicillin; Brimonidine; Cyclobenzaprine; and Gabapentin  Family History  Problem Relation Age of Onset  . Cancer Mother   . Hypertension Brother   . Heart attack Father     Social History History  Substance Use Topics  . Smoking status: Former Smoker    Quit date: 08/30/2006  . Smokeless tobacco: Never Used  . Alcohol Use: 1.2 oz/week    2 Cans of beer per week     Comment: occasional use    Review of Systems Constitutional: No fever/chills Eyes: No visual changes. ENT: No sore throat. Cardiovascular: Denies chest pain. Respiratory: Denies shortness of breath. Gastrointestinal: See history of present illness. No blood in his stool. No dark or black stools. Continues to eat well. Genitourinary: Negative for dysuria. Musculoskeletal: Negative for back pain. Skin: Negative for rash. Neurological: Negative for headaches, focal weakness or numbness.  10-point ROS otherwise negative.  ____________________________________________   PHYSICAL EXAM:  VITAL SIGNS: ED Triage Vitals  Enc Vitals Group     BP 11/20/14 1206 144/96 mmHg     Pulse Rate 11/20/14 1206 64     Resp 11/20/14 1206 20     Temp 11/20/14 1206 97.7 F (36.5 C)     Temp Source 11/20/14 1206 Oral     SpO2 11/20/14 1206 94 %     Weight 11/20/14 1206 231 lb (104.781 kg)     Height 11/20/14 1206  (1.727 m)     Head Cir --      Peak Flow --      Pain Score 11/20/14 1414 3     Pain Loc --      Pain Edu? --      Excl. in GC? --     Constitutional: Alert and oriented. Well appearing and in no acute distress. Eyes: Conjunctivae are normal. PERRL. EOMI. Head: Atraumatic. Nose: No congestion/rhinnorhea. Mouth/Throat: Mucous membranes are moist.  Oropharynx non-erythematous. Neck: No stridor.   Cardiovascular: Normal rate, regular rhythm. Grossly normal heart sounds.  Good peripheral circulation. Respiratory: Normal respiratory effort.  No retractions. Lungs CTAB. Patient is barrel  chested in appearance, however no evidence of distress. He does use oxygen at home from time to time. Gastrointestinal: Soft and nontender. Minimal distention. No abdominal bruits. No CVA tenderness. Rectal exam does not demonstrate impaction. The rectal vault appears empty. There is no blood in his stool. Musculoskeletal: No lower extremity tenderness but 1+ lower extremity edema bilateral.  No joint effusions. Neurologic:  Normal speech and language. No gross focal neurologic deficits are appreciated. Speech is normal. No gait instability. Skin:  Skin is warm, dry and intact. No rash noted. Psychiatric: Mood and affect are normal. Speech and behavior are normal.  ____________________________________________   LABS (all labs ordered are listed, but only abnormal results are displayed)  Labs Reviewed  CBC WITH DIFFERENTIAL/PLATELET - Abnormal; Notable for the following:    RBC 4.05 (*)    Hemoglobin 11.9 (*)    HCT 36.8 (*)    RDW 17.6 (*)    Platelets 130 (*)    Lymphs Abs 0.8 (*)    All other components within normal limits  BASIC METABOLIC PANEL - Abnormal; Notable for the following:    Sodium 132 (*)    Chloride 90 (*)    CO2 36 (*)    Glucose, Bld 102 (*)    All other components within normal limits  ____________________________________________  EKG _____________________________________  RADIOLOGY  CLINICAL DATA: Constipation. No bowel movement for 2 days.  EXAM: ABDOMEN - 2 VIEW  COMPARISON: 10/23/2014  FINDINGS: There is no evidence of intraperitoneal free air. There is elevation of the right hemidiaphragm with mild gaseous distension of the underlying hepatic flexure. A small amount of stool is present throughout the colon. Gas is present in nondilated loops of small bowel without evidence of obstruction. Subsegmental atelectasis is partially visualized in the right lung base. Aorto bi-iliac stent graft and prior L4-5 lumbar fusion are again  noted.  IMPRESSION: Mild gaseous distension of the proximal colon. No evidence of bowel obstruction ____________________________________________   PROCEDURES  Procedure(s) performed: None  Critical Care performed: No  ____________________________________________   INITIAL IMPRESSION / ASSESSMENT AND PLAN / ED COURSE  Pertinent labs & imaging results that were available during my care of the patient were reviewed by me and considered in my medical decision making (see chart for details).  Patient presents with "constipation". His history and exam seem to be consistent with his stated complaint. There is no evidence of any acute surgical abdomen. He is afebrile. There is no significant leukocytosis. His abdominal exam is very reassuring. His rectal exam does not demonstrate an acute obstruction. He is on oxygen at home, we discussed adding additional axes and attempting magnesium citrate at home as well. We will obtain x-ray to evaluate and assure no evidence of bowel structure in, though I highly doubt a based on history and exam.  Discussed with patient close return precautions, treatment recommendations, and additional accident. He'll follow-up with his physician this week. Return if his symptoms are worsening, vomiting, fever or severe pain.  ----------------------------------------- 4:27 PM on 11/20/2014 -----------------------------------------  He remained stable. His KUB does not demonstrate acute abnormality. He does have mild distention of the proximal colon, this is where the patient has reported his crampiness has settled. At this point, based on his history, exam, and clinical appearance I do not feel that CT imaging is warranted. I did discuss with the patient to follow-up with his gastroenterologist, he will also trial MiraLAX as his symptoms do suggest possible constipation.  He is agreeable to return precautions, he'll return right away if he is vomiting, unable to keep  food down, his symptoms are worsening, he is not able to pass gas or have a bowel movement, or other new concerns arise. ____________________________________________   FINAL CLINICAL IMPRESSION(S) / ED DIAGNOSES  Final diagnoses:  Constipation      Sharyn CreamerMark Quale, MD 11/20/14 [phone]

## 2014-11-20 NOTE — Discharge Instructions (Signed)
Please follow-up with your physician in the next 2-3 days. Return to the ER right away should develop fever, severe pain, inability to pass gas or you stop stooling, have vomiting, or other new concerns arise..  I do recommend that you take a dose of magnesium citrate, you purchase this at over-the-counter pharmacies. Continue MiraLAX regimen.

## 2014-11-20 NOTE — ED Notes (Signed)
[facility]  Pt alert.  Pt here for possible constipation   Alert.  Pt on 3 liters oxygen prn for copd.

## 2014-11-20 NOTE — ED Notes (Signed)
Report given to Amy RN.

## 2014-11-20 NOTE — ED Notes (Signed)
Pt states he has to push to get his stool from his rectum and has tried otc aids for constipation with no relief. No acute distress noted.

## 2014-11-20 NOTE — ED Notes (Signed)
Patient transported to X-ray 

## 2014-11-20 NOTE — ED Notes (Signed)
Pt here with c/o constipation, however, stools have been loose X3. He is concerned he has an ulcer in his rectum. States he has to strain to pass any gas as well. Appears in no distress.

## 2014-11-26 ENCOUNTER — Encounter: Payer: Self-pay | Admitting: Emergency Medicine

## 2014-11-26 ENCOUNTER — Emergency Department
Admission: EM | Admit: 2014-11-26 | Discharge: 2014-11-26 | Disposition: A | Discharge status: Home / Self Care | Payer: Medicare Other | Attending: Emergency Medicine | Admitting: Emergency Medicine

## 2014-11-26 DIAGNOSIS — Z880 Allergy status to penicillin: Secondary | ICD-10-CM | POA: Insufficient documentation

## 2014-11-26 DIAGNOSIS — Z7982 Long term (current) use of aspirin: Secondary | ICD-10-CM | POA: Diagnosis not present

## 2014-11-26 DIAGNOSIS — Z7951 Long term (current) use of inhaled steroids: Secondary | ICD-10-CM | POA: Diagnosis not present

## 2014-11-26 DIAGNOSIS — N390 Urinary tract infection, site not specified: Secondary | ICD-10-CM | POA: Diagnosis not present

## 2014-11-26 DIAGNOSIS — I10 Essential (primary) hypertension: Secondary | ICD-10-CM | POA: Insufficient documentation

## 2014-11-26 DIAGNOSIS — K5900 Constipation, unspecified: Secondary | ICD-10-CM | POA: Insufficient documentation

## 2014-11-26 DIAGNOSIS — R109 Unspecified abdominal pain: Secondary | ICD-10-CM | POA: Diagnosis present

## 2014-11-26 DIAGNOSIS — Z87891 Personal history of nicotine dependence: Secondary | ICD-10-CM | POA: Insufficient documentation

## 2014-11-26 LAB — CBC WITH DIFFERENTIAL/PLATELET
Basophils Absolute: 0.1 10*3/uL (ref 0–0.1)
Basophils Relative: 1 %
Eosinophils Absolute: 0.3 10*3/uL (ref 0–0.7)
Eosinophils Relative: 4 %
HCT: 37.6 % — ABNORMAL LOW (ref 40.0–52.0)
Hemoglobin: 12.5 g/dL — ABNORMAL LOW (ref 13.0–18.0)
Lymphocytes Relative: 22 %
Lymphs Abs: 1.6 10*3/uL (ref 1.0–3.6)
MCH: 30.4 pg (ref 26.0–34.0)
MCHC: 33.1 g/dL (ref 32.0–36.0)
MCV: 92 fL (ref 80.0–100.0)
MONO ABS: 0.8 10*3/uL (ref 0.2–1.0)
NEUTROS ABS: 4.5 10*3/uL (ref 1.4–6.5)
Platelets: 138 10*3/uL — ABNORMAL LOW (ref 150–440)
RBC: 4.09 MIL/uL — ABNORMAL LOW (ref 4.40–5.90)
RDW: 17.1 % — AB (ref 11.5–14.5)
WBC: 7.3 10*3/uL (ref 3.8–10.6)

## 2014-11-26 LAB — URINALYSIS COMPLETE WITH MICROSCOPIC (ARMC ONLY)
BILIRUBIN URINE: NEGATIVE
GLUCOSE, UA: NEGATIVE mg/dL
HGB URINE DIPSTICK: NEGATIVE
Ketones, ur: NEGATIVE mg/dL
Nitrite: POSITIVE — AB
Protein, ur: NEGATIVE mg/dL
Specific Gravity, Urine: 1.011 (ref 1.005–1.030)
Squamous Epithelial / LPF: NONE SEEN
pH: 7 (ref 5.0–8.0)

## 2014-11-26 LAB — COMPREHENSIVE METABOLIC PANEL
ALT: 15 U/L — ABNORMAL LOW (ref 17–63)
AST: 18 U/L (ref 15–41)
Albumin: 4.1 g/dL (ref 3.5–5.0)
Alkaline Phosphatase: 52 U/L (ref 38–126)
Anion gap: 5 (ref 5–15)
BUN: 11 mg/dL (ref 6–20)
CALCIUM: 8.6 mg/dL — AB (ref 8.9–10.3)
CO2: 35 mmol/L — ABNORMAL HIGH (ref 22–32)
Chloride: 88 mmol/L — ABNORMAL LOW (ref 101–111)
Creatinine, Ser: 0.89 mg/dL (ref 0.61–1.24)
GFR calc non Af Amer: >60 mL/min (ref >60)
Glucose, Bld: 90 mg/dL (ref 65–99)
POTASSIUM: 4.5 mmol/L (ref 3.5–5.1)
SODIUM: 128 mmol/L — AB (ref 135–145)
Total Bilirubin: 1 mg/dL (ref 0.3–1.2)
Total Protein: 6.7 g/dL (ref 6.5–8.1)

## 2014-11-26 MED ORDER — NITROFURANTOIN MACROCRYSTAL 100 MG PO CAPS
ORAL_CAPSULE | ORAL | Status: AC
  Filled 2014-11-26: qty 1

## 2014-11-26 MED ORDER — NITROFURANTOIN MONOHYD MACRO 100 MG PO CAPS
100.0000 mg | ORAL_CAPSULE | Freq: Once | ORAL | Status: AC
  Administered 2014-11-26: 100 mg via ORAL
  Filled 2014-11-26: qty 1

## 2014-11-26 MED ORDER — NITROFURANTOIN MONOHYD MACRO 100 MG PO CAPS: 100.0000 mg | ORAL_CAPSULE | Freq: Two times a day (BID) | ORAL | Status: AC

## 2014-11-26 NOTE — ED Provider Notes (Addendum)
[facility] Emergency Department Provider Note  ____________________________________________  Time seen: Approximately 8:15 AM  I have reviewed the triage vital signs and the nursing notes.   HISTORY  Chief Complaint Constipation and Abdominal Pain    HPI Chad Huffman is a 69 y.o. male with a history of chronic neck and back pain presents with diarrhea with concern with constipation. The patient did have one loose bowel movement yesterday without blood. Has been feeling as if he has not been able to completely empty his bowels over the past week or so. Has been taking mag citrate at home. No nausea or vomiting. No abdominal pain. No issues urinating. No numbness or weakness at this time.Says that he just wants to make sure there is "not something still up there."   Past Medical History  Diagnosis Date  . Antalgic gait   . Hypertension   . Lumbago   . COPD (chronic obstructive pulmonary disease)   . Complication of anesthesia     pt has had cervical fusion-limited neck flextion  . Depression   . DJD (degenerative joint disease)     Right shoulder  . Carotid artery occlusion   . Peripheral neuropathy   . Chronic neck and back pain     Patient Active Problem List   Diagnosis Date Noted  . Cervical vertebral fusion   . History of lumbar laminectomy   . Chronic respiratory acidosis   . Depression   . Chronic neck and back pain   . Acute on chronic respiratory failure with hypercapnia   . Acute on chronic respiratory failure with hypoxia   . COPD exacerbation   . HCAP (healthcare-associated pneumonia)   . Congestive dilated cardiomyopathy   . Syncope and collapse   . Hyponatremia   . Other specified hypothyroidism   . Acute respiratory failure with hypoxia and hypercarbia 08/04/2014  . S/P cervical spinal fusion 08/04/2014  . S/P lumbar laminectomy 08/04/2014  . Right shoulder pain 08/04/2014  . Arthritis of right hip 08/04/2014  . Essential  hypertension 08/04/2014  . Hypothyroidism 08/04/2014  . Syncope 08/04/2014  . Fall 08/04/2014  . Low back pain 08/04/2014  . Pedal edema 08/04/2014  . Occlusion and stenosis of carotid artery without mention of cerebral infarction 12/10/2013  . Aftercare following surgery of the circulatory system, NEC 12/10/2013  . Pain of right upper extremity-Right Neck-Shoulder-Back 12/10/2013  . Abdominal aneurysm without mention of rupture 12/03/2011    Past Surgical History  Procedure Laterality Date  . Tonsillectomy    . Carpal tunnel release      bilateral CTS  . Abdominal aortic aneurysm repair  2009    Endovascular AAA repair  . Pilonidal cyst / sinus excision    . Spine surgery  09/15/11    Scar tissue removed- back  . Spine surgery  09/15/11    Scar tissue removed- back  . Neck surgery    . Rotary cuff    . Hammer toe surgery  12/10/2011    Procedure: HAMMER TOE CORRECTION;  Surgeon: Ernestene Kiel, DPM;  Location: [facility];  Service: Podiatry;  Laterality: Right;  Right hammertoe repair second right with 2.5x42 orthopro screw   . Metatarsal osteotomy  12/10/2011    Procedure: METATARSAL OSTEOTOMY;  Surgeon: Ernestene Kiel, DPM;  Location: [facility];  Service: Podiatry;  Laterality: Right;  Right Keller arthroplasty with size 5 silicone implant right great toe; second metatarsal osteotomy with 2.0x14 screw  . Abdominal  aortic aneurysm repair      Current Outpatient Rx  Name  Route  Sig  Dispense  Refill  . ALPRAZolam (XANAX) 0.5 MG tablet   Oral   Take 0.5-0.75 mg by mouth 2 (two) times daily as needed for anxiety.         Marland Kitchen aspirin 81 MG tablet   Oral   Take 81 mg by mouth daily.         . baclofen (LIORESAL) 20 MG tablet   Oral   Take 20-30 mg by mouth every 3 (three) hours as needed for muscle spasms.         Marland Kitchen desonide (DESOWEN) 0.05 % cream   Topical   Apply 0.05 % topically as needed.         . furosemide (LASIX) 40 MG tablet   Oral    Take 1 tablet (40 mg total) by mouth 2 (two) times daily.   60 tablet   0   . ibuprofen (ADVIL,MOTRIN) 200 MG tablet   Oral   Take 400 mg by mouth every 6 (six) hours as needed.         . Ipratropium-Albuterol (COMBIVENT RESPIMAT) 20-100 MCG/ACT AERS respimat   Inhalation   Inhale 1 puff into the lungs every 6 (six) hours.   1 Inhaler   0     For use when you are away from home and can't use  ...   . ipratropium-albuterol (DUONEB) 0.5-2.5 (3) MG/3ML SOLN   Nebulization   Take 3 mLs by nebulization every 6 (six) hours.   360 mL   0   . ketoconazole (NIZORAL) 2 % shampoo               . KRILL OIL PO   Oral   Take by mouth daily.         Marland Kitchen levothyroxine (SYNTHROID, LEVOTHROID) 125 MCG tablet   Oral   Take 1 tablet (125 mcg total) by mouth daily before breakfast.   30 tablet   0   . losartan (COZAAR) 50 MG tablet   Oral   Take 50 mg by mouth daily.         . magnesium hydroxide (MILK OF MAGNESIA) 400 MG/5ML suspension   Oral   Take by mouth daily as needed for mild constipation.         . Multiple Vitamin (MULTIVITAMIN) tablet   Oral   Take 1 tablet by mouth daily.         Marland Kitchen OVER THE COUNTER MEDICATION   Both Eyes   Place 2 drops into both eyes daily.         Marland Kitchen oxycodone (OXY-IR) 5 MG capsule   Oral   Take 5 mg by mouth every 4 (four) hours as needed for pain.         . polyethylene glycol (MIRALAX / GLYCOLAX) packet   Oral   Take 17 g by mouth daily.          Marland Kitchen EXPIRED: promethazine (PHENERGAN) 12.5 MG tablet   Oral   Take 2 tablets (25 mg total) by mouth every 6 (six) hours as needed for nausea. Patient not taking: Reported on 11/26/2014   30 tablet   0   . sertraline (ZOLOFT) 50 MG tablet   Oral   Take 50 mg by mouth daily.         . SYMBICORT 160-4.5 MCG/ACT inhaler   Inhalation   Inhale 2 puffs into the lungs  2 (two) times daily.          . vitamin B-12 (CYANOCOBALAMIN) 1000 MCG tablet   Oral   Take 1,000 mcg by mouth  every 30 (thirty) days.           Allergies Ampicillin; Brimonidine; Cyclobenzaprine; Tizanidine; and Gabapentin  Family History  Problem Relation Age of Onset  . Cancer Mother   . Hypertension Brother   . Heart attack Father     Social History History  Substance Use Topics  . Smoking status: Former Smoker    Quit date: 08/30/2006  . Smokeless tobacco: Never Used  . Alcohol Use: 1.2 oz/week    2 Cans of beer per week     Comment: occasional use    Review of Systems Constitutional: No fever/chills Eyes: No visual changes. ENT: No sore throat. Cardiovascular: Denies chest pain. Respiratory: Denies shortness of breath. Gastrointestinal: No abdominal pain.  No nausea, no vomiting.  No diarrhea.   Genitourinary: Negative for dysuria. Musculoskeletal: Negative for back pain. Skin: Negative for rash. Neurological: Negative for headaches, focal weakness or numbness.  10-point ROS otherwise negative.  ____________________________________________   PHYSICAL EXAM:  VITAL SIGNS: ED Triage Vitals  Enc Vitals Group     BP 11/26/14 0510 133/71 mmHg     Pulse Rate 11/26/14 0510 72     Resp 11/26/14 0510 20     Temp 11/26/14 0510 97.7 F (36.5 C)     Temp Source 11/26/14 0510 Oral     SpO2 11/26/14 0510 95 %     Weight 11/26/14 0510 226 lb (102.513 kg)     Height 11/26/14 0510 5\' 8"  (1.727 m)     Head Cir --      Peak Flow --      Pain Score 11/26/14 0513 7     Pain Loc --      Pain Edu? --      Excl. in GC? --     Constitutional: Alert and oriented. Well appearing and in no acute distress. Eyes: Conjunctivae are normal. PERRL. EOMI. Head: Atraumatic. Nose: No congestion/rhinnorhea. Mouth/Throat: Mucous membranes are moist.  Oropharynx non-erythematous. Neck: No stridor.   Cardiovascular: Normal rate, regular rhythm. Grossly normal heart sounds.  Good peripheral circulation. Respiratory: Normal respiratory effort.  No retractions. Lungs CTAB. Gastrointestinal:  Soft and nontender. No distention. No abdominal bruits. No CVA tenderness. Genitourinary: Uncircumcised. Normal external examination otherwise. Musculoskeletal: No lower extremity tenderness nor edema.  No joint effusions. Neurologic:  Normal speech and language. No gross focal neurologic deficits are appreciated. Speech is normal. No gait instability. Skin:  Skin is warm, dry and intact. No rash noted. Psychiatric: Mood and affect are normal. Speech and behavior are normal.  ____________________________________________   LABS (all labs ordered are listed, but only abnormal results are displayed)  Labs Reviewed  COMPREHENSIVE METABOLIC PANEL - Abnormal; Notable for the following:    Sodium 128 (*)    Chloride 88 (*)    CO2 35 (*)    Calcium 8.6 (*)    ALT 15 (*)    All other components within normal limits  CBC WITH DIFFERENTIAL/PLATELET - Abnormal; Notable for the following:    RBC 4.09 (*)    Hemoglobin 12.5 (*)    HCT 37.6 (*)    RDW 17.1 (*)    Platelets 138 (*)    All other components within normal limits  URINALYSIS COMPLETEWITH MICROSCOPIC ([facility] ONLY) - Abnormal; Notable for the following:    Color,  Urine YELLOW (*)    APPearance CLEAR (*)    Nitrite POSITIVE (*)    Leukocytes, UA 3+ (*)    Bacteria, UA MANY (*)    All other components within normal limits   ____________________________________________  EKG   ____________________________________________  RADIOLOGY   ____________________________________________   PROCEDURES  Procedure(s) performed: None  Critical Care performed: No  ____________________________________________   INITIAL IMPRESSION / ASSESSMENT AND PLAN / ED COURSE  Pertinent labs & imaging results that were available during my care of the patient were reviewed by me and considered in my medical decision making (see chart for details).  ----------------------------------------- 9:04 AM on  11/26/2014 -----------------------------------------  Patient resting comfortably. We'll treat for UTI. Possible causes of vague pains of having to move bowels and diarrhea secondary to UTI. We will discharge to home.  No bowel movements today. Due to white count and low frequency of stools do not suspect C. difficile. ____________________________________________   FINAL CLINICAL IMPRESSION(S) / ED DIAGNOSES  Acute diarrhea. Acute urinary tract infection. Return visit.    Myrna Blazeravid Matthew Annabella Elford, MD 11/26/14 [phone]  Rectal exam without fecal impaction. Heme-negative.  Myrna Blazeravid Matthew Shelbia Scinto, MD 11/26/14 [phone]

## 2014-11-26 NOTE — ED Notes (Signed)
Pt presents to ED via EMS with c/o constipation. Pt states he was seen in this ED for the same in the past week but refused the enema. Pt states he has taken the other recommended medications with no relief. Pt states his abd feels full and reports he has "a lot of discomfort" in his abd with nausea. Seen by his pcp yesterday but pt did not mention his constipation at that time. Pt states he thought he would be able to go on his own. Pt takes pain medication for his chronic pain. Hx of constipation.

## 2014-11-26 NOTE — ED Notes (Signed)
MD at bedside. 

## 2014-11-28 LAB — URINE CULTURE

## 2014-12-16 ENCOUNTER — Encounter (HOSPITAL_COMMUNITY): Payer: Self-pay

## 2014-12-16 ENCOUNTER — Other Ambulatory Visit (HOSPITAL_COMMUNITY): Payer: Self-pay

## 2014-12-16 ENCOUNTER — Ambulatory Visit: Payer: Self-pay | Admitting: Family

## 2015-01-06 ENCOUNTER — Encounter: Payer: Self-pay | Admitting: Family

## 2015-01-08 ENCOUNTER — Other Ambulatory Visit: Payer: Self-pay | Admitting: Family

## 2015-01-08 DIAGNOSIS — Z48812 Encounter for surgical aftercare following surgery on the circulatory system: Secondary | ICD-10-CM

## 2015-01-08 DIAGNOSIS — I6523 Occlusion and stenosis of bilateral carotid arteries: Secondary | ICD-10-CM

## 2015-01-08 DIAGNOSIS — I714 Abdominal aortic aneurysm, without rupture, unspecified: Secondary | ICD-10-CM

## 2015-01-09 ENCOUNTER — Encounter: Payer: Self-pay | Admitting: Family

## 2015-01-09 ENCOUNTER — Ambulatory Visit (INDEPENDENT_AMBULATORY_CARE_PROVIDER_SITE_OTHER)
Admission: RE | Admit: 2015-01-09 | Discharge: 2015-01-09 | Disposition: A | Discharge status: Home / Self Care | Payer: Medicare Other | Source: Ambulatory Visit | Attending: Family | Admitting: Family

## 2015-01-09 ENCOUNTER — Ambulatory Visit (INDEPENDENT_AMBULATORY_CARE_PROVIDER_SITE_OTHER): Payer: Medicare Other | Admitting: Family

## 2015-01-09 ENCOUNTER — Ambulatory Visit (HOSPITAL_COMMUNITY)
Admission: RE | Admit: 2015-01-09 | Discharge: 2015-01-09 | Disposition: A | Discharge status: Home / Self Care | Payer: Medicare Other | Source: Ambulatory Visit | Attending: Family | Admitting: Family

## 2015-01-09 VITALS — BP 130/82 | HR 63 | Temp 96.8°F | Resp 16 | Ht 68.5 in | Wt 228.0 lb

## 2015-01-09 DIAGNOSIS — Z95828 Presence of other vascular implants and grafts: Secondary | ICD-10-CM | POA: Diagnosis not present

## 2015-01-09 DIAGNOSIS — I6523 Occlusion and stenosis of bilateral carotid arteries: Secondary | ICD-10-CM

## 2015-01-09 DIAGNOSIS — Z87891 Personal history of nicotine dependence: Secondary | ICD-10-CM | POA: Diagnosis not present

## 2015-01-09 DIAGNOSIS — Z48812 Encounter for surgical aftercare following surgery on the circulatory system: Secondary | ICD-10-CM | POA: Insufficient documentation

## 2015-01-09 DIAGNOSIS — I714 Abdominal aortic aneurysm, without rupture, unspecified: Secondary | ICD-10-CM

## 2015-01-09 NOTE — Progress Notes (Signed)
[facility] [place]  Established EVAR  History of Present Illness  Chad Huffman is a 69 y.o. (1945-12-24) male patient of Dr. Myra Gianotti who is status post stent repair of a AAA in 2009. Patient is also followed for carotid artery stenosis. He returns today for follow up. 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 He denies history of stroke or TIA. 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 He denies any new back pain, denies abdominal pain. He denies legs weakness, uses a cane and walker for stability.  He denies non healing wounds in feet/legs.  He denies any known cardiac issues.  He denies any new medical problems or new surgeries.  Pt Diabetic: No Pt smoker: former smoker, quit in 2008  Pt meds include: Statin :No, states his cholesterol has been good. Betablocker: No ASA: Yes Other anticoagulants/antiplatelets: no      Past Medical History  Diagnosis Date  . Antalgic gait   . Hypertension   . Lumbago   . COPD (chronic obstructive pulmonary disease)   . Complication of anesthesia     pt has had cervical fusion-limited neck flextion  . Depression   . DJD (degenerative joint disease)     Right shoulder  . Carotid artery occlusion   . Peripheral neuropathy   . Chronic neck and back pain    Past Surgical History  Procedure Laterality Date  . Tonsillectomy    . Carpal tunnel release      bilateral CTS  . Abdominal aortic aneurysm repair  2009    Endovascular AAA repair  . Pilonidal cyst / sinus excision    . Spine surgery  09/15/11    Scar tissue removed- back  . Spine surgery  09/15/11    Scar tissue removed-  back  . Neck surgery    . Rotary cuff    . Hammer toe surgery  12/10/2011    Procedure: HAMMER TOE CORRECTION;  Surgeon: Ernestene Kiel, DPM;  Location: [facility];  Service: Podiatry;  Laterality: Right;  Right hammertoe repair second right with 2.5x42 orthopro screw   . Metatarsal osteotomy  12/10/2011    Procedure: METATARSAL OSTEOTOMY;  Surgeon: Ernestene Kiel, DPM;  Location: [facility];  Service: Podiatry;  Laterality: Right;  Right Keller arthroplasty with size 5 silicone implant right great toe; second metatarsal osteotomy with 2.0x14 screw  . Abdominal aortic aneurysm repair     Social History History  Substance Use Topics  . Smoking status: Former Smoker    Quit date: 08/30/2006  . Smokeless tobacco: Never Used  . Alcohol Use: 1.2 oz/week    2 Cans of beer per week     Comment: occasional use   Family History Family History  Problem Relation Age of Onset  . Cancer Mother     Stomach  . Hypertension Brother   . Heart attack Father    Current Outpatient Prescriptions on File Prior to Visit  Medication Sig Dispense Refill  . ALPRAZolam (XANAX) 0.5 MG tablet Take 0.5-0.75 mg by mouth 2 (two) times daily as needed for anxiety.    Marland Kitchen  aspirin 81 MG tablet Take 81 mg by mouth daily.    . baclofen (LIORESAL) 20 MG tablet Take 20-30 mg by mouth every 3 (three) hours as needed for muscle spasms.    Marland Kitchen desonide (DESOWEN) 0.05 % cream Apply 0.05 % topically as needed.    . furosemide (LASIX) 40 MG tablet Take 1 tablet (40 mg total) by mouth 2 (two) times daily. 60 tablet 0  . ibuprofen (ADVIL,MOTRIN) 200 MG tablet Take 400 mg by mouth every 6 (six) hours as needed.    Marland Kitchen ketoconazole (NIZORAL) 2 % shampoo     . KRILL OIL PO Take by mouth daily.    Marland Kitchen levothyroxine (SYNTHROID, LEVOTHROID) 125 MCG tablet Take 1 tablet (125 mcg total) by mouth daily before breakfast. 30 tablet 0  . losartan (COZAAR) 50 MG tablet Take 50 mg by mouth daily.    . magnesium hydroxide (MILK  OF MAGNESIA) 400 MG/5ML suspension Take by mouth daily as needed for mild constipation.    . Multiple Vitamin (MULTIVITAMIN) tablet Take 1 tablet by mouth daily.    Marland Kitchen OVER THE COUNTER MEDICATION Place 2 drops into both eyes daily.    Marland Kitchen oxycodone (OXY-IR) 5 MG capsule Take 5 mg by mouth every 4 (four) hours as needed for pain.    . polyethylene glycol (MIRALAX / GLYCOLAX) packet Take 17 g by mouth daily.     . sertraline (ZOLOFT) 50 MG tablet Take 50 mg by mouth daily.    . SYMBICORT 160-4.5 MCG/ACT inhaler Inhale 2 puffs into the lungs 2 (two) times daily.     . Ipratropium-Albuterol (COMBIVENT RESPIMAT) 20-100 MCG/ACT AERS respimat Inhale 1 puff into the lungs every 6 (six) hours. (Patient not taking: Reported on 01/09/2015) 1 Inhaler 0  . ipratropium-albuterol (DUONEB) 0.5-2.5 (3) MG/3ML SOLN Take 3 mLs by nebulization every 6 (six) hours. (Patient not taking: Reported on 01/09/2015) 360 mL 0  . promethazine (PHENERGAN) 12.5 MG tablet Take 2 tablets (25 mg total) by mouth every 6 (six) hours as needed for nausea. (Patient not taking: Reported on 11/26/2014) 30 tablet 0  . vitamin B-12 (CYANOCOBALAMIN) 1000 MCG tablet Take 1,000 mcg by mouth every 30 (thirty) days.     No current facility-administered medications on file prior to visit.   Allergies  Allergen Reactions  . Ampicillin Swelling  . Brimonidine     Pt cant recall what happened  . Cyclobenzaprine Other (See Comments)    Reaction: Hallucinations  . Tizanidine Other (See Comments)    Reaction: Hallucinations  . Gabapentin Rash     ROS: See HPI for pertinent positives and negatives.  Physical Examination  Filed Vitals:   01/09/15 1120 01/09/15 1123  BP: 133/84 130/82  Pulse: 63 63  Temp:  96.8 F (36 C)  TempSrc:  Oral  Resp:  16  Height:  5' 8.5" (1.74 m)  Weight:  228 lb (103.42 kg)  SpO2:  97%   Body mass index is 34.16 kg/(m^2).  General: WDWN obese male in NAD Gait: antalgic, stiff, walking with walker HENT:  WNL Eyes: Pupils equal Pulmonary: normal non-labored breathing , without Rales, rhonchi, wheezing Cardiac: RRR, no murmur detected  Abdomen: soft, NT, no palpable masses Skin: no rashes, no ulcers; + for seborrheic dermatitis of scalp and face, no Gangrene , no cellulitis; no open wounds;   VASCULAR EXAM  Carotid Bruits Left Right   Negative Negative   Aorta is not palpable Bilateral radial pulses are 2+ palpable   VASCULAR  EXAM: Extremities without ischemic changes  without Gangrene; without open wounds.      LE Pulses LEFT RIGHT   FEMORAL palpable  palpable    POPLITEAL not palpable  not palpable   POSTERIOR TIBIAL  palpable  not palpable    DORSALIS PEDIS  ANTERIOR TIBIAL not palpable  palpable     Musculoskeletal: no muscle wasting or atrophy; no edema, pt holds neck stiffly, little rotation Neurologic: A&O X 3; Appropriate Affect ;  SENSATION: normal; MOTOR FUNCTION: 3/5 upper extremities, 5/5 lower extremities, CN 2-12 intact.  Speech is fluent/normal         Non-Invasive Vascular Imaging  EVAR Duplex (Date: 01/09/2015)) ABDOMINAL AORTA DUPLEX EVALUATION - POST ENDOVASCULAR REPAIR    INDICATION: Abdominal aortic aneurysm ; Patency of Endovascular aortic repair    PREVIOUS INTERVENTION(S): Endovascular aortic repair placed 10/11/2007.    DUPLEX EXAM:      DIAMETER AP (cm) DIAMETER TRANSVERSE (cm) VELOCITIES (cm/sec)  Aorta 3.94 4.01 59  Right Common Iliac   155  Left Common Iliac   132    Comparison Study       Date DIAMETER AP (cm) DIAMETER TRANSVERSE (cm)  12/04/2012 4.30 4.47  ADDITIONAL FINDINGS:     IMPRESSION: Technically difficult and limited evaluation due to excessive bowel gas present. Abdominal aortic sac present  measuring 3.94cm AP x 4.01cm TRV, no extraluminal Color Doppler observed.    Compared to the previous exam:  Decrease in sac size since previous study on 12/04/2012.     CEREBROVASCULAR DUPLEX EVALUATION    INDICATION: Carotid artery stenosis    PREVIOUS INTERVENTION(S):     DUPLEX EXAM:     RIGHT  LEFT  Peak Systolic Velocities (cm/s) End Diastolic Velocities (cm/s) Plaque LOCATION Peak Systolic Velocities (cm/s) End Diastolic Velocities (cm/s) Plaque  NV NV  CCA PROXIMAL 101 26   89 18  CCA MID 86 21   88 24 HT CCA DISTAL 105 37 HT  148 24 HT ECA 200 16 HT  233/384 80/133 CP ICA PROXIMAL 85 25 HT  NV NV  ICA MID 123 34   NV NV  ICA DISTAL 59 21     4.31 ICA / CCA Ratio (PSV) 0.99  Antegrade Vertebral Flow Antegrade  158 Brachial Systolic Pressure (mmHg) 152  Triphasic Brachial Artery Waveforms Triphasic    Plaque Morphology:  HM = Homogeneous, HT = Heterogeneous, CP = Calcific Plaque, SP = Smooth Plaque, IP = Irregular Plaque     ADDITIONAL FINDINGS: Right subclavian artery PSV168cm/sec; Left subclavian artery PSV 140cm/sec    IMPRESSION: Right internal carotid artery stenosis present in the 80%-99% range, unable to visualize beyond proximal segment due to patient inability to rotate head and vessel tortuousity. Left internal carotid artery stenosis present in the less than 40% range. Left external carotid artery stenosis present.    Compared to the previous exam:  Increase in disease on the right and stable on the left since study on 12/10/2013.      Medical Decision Making  Chad Huffman is a 69 y.o. male who is status post a AAA stent repair in 2009. Patient is also followed for carotid artery stenosis. He has chronic spine issues, no new back pain, no abdominal pain.  He has no recent history of stroke or TIA. 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  Today's EVAR Duplex was technically  difficult and limited evaluation  due to excessive bowel gas present. Abdominal aortic sac present measuring 3.94cm AP x 4.01cm TRV, no extraluminal Color Doppler observed. Decrease in sac size since previous study on 12/04/2012.  Today's carotid Duplex suggests right internal carotid artery stenosis present in the 80%-99% range, unable to visualize beyond proximal segment due to patient inability to rotate head and vessel tortuousity. Left internal carotid artery stenosis present in the less than 40% range. Increase in disease on the right and stable on the left since study on 12/10/2013.  Pt is scheduled to follow up with Dr. Myra Gianotti on 01/13/15 for discussion re asymptomatic increase in right internal carotid artery stenosis to 80-99%. 1 year follow up of EVAR with Duplex.  Face to face time with patient was 25 minutes. Over 50% of this time was spent on counseling and coordination of care.    I emphasized the importance of maximal medical management including strict control of blood pressure, blood glucose, and lipid levels, antiplatelet agents, obtaining regular exercise, and cessation of smoking.   Thank you for allowing [place] to participate in this patient's care.  Charisse March, RN, MSN, FNP-C [facility] [place] Office: [phone]  Clinic Physician: Darrick Penna  01/09/2015, 12:08 PM

## 2015-01-09 NOTE — Patient Instructions (Signed)
Stroke Prevention Some medical conditions and behaviors are associated with an increased chance of having a stroke. You may prevent a stroke by making healthy choices and managing medical conditions. HOW CAN I REDUCE MY RISK OF HAVING A STROKE?   Stay physically active. Get at least 30 minutes of activity on most or all days.  Do not smoke. It may also be helpful to avoid exposure to secondhand smoke.  Limit alcohol use. Moderate alcohol use is considered to be:  No more than 2 drinks per day for men.  No more than 1 drink per day for nonpregnant women.  Eat healthy foods. This involves:  Eating 5 or more servings of fruits and vegetables a day.  Making dietary changes that address high blood pressure (hypertension), high cholesterol, diabetes, or obesity.  Manage your cholesterol levels.  Making food choices that are high in fiber and low in saturated fat, trans fat, and cholesterol may control cholesterol levels.  Take any prescribed medicines to control cholesterol as directed by your health care provider.  Manage your diabetes.  Controlling your carbohydrate and sugar intake is recommended to manage diabetes.  Take any prescribed medicines to control diabetes as directed by your health care provider.  Control your hypertension.  Making food choices that are low in salt (sodium), saturated fat, trans fat, and cholesterol is recommended to manage hypertension.  Take any prescribed medicines to control hypertension as directed by your health care provider.  Maintain a healthy weight.  Reducing calorie intake and making food choices that are low in sodium, saturated fat, trans fat, and cholesterol are recommended to manage weight.  Stop drug abuse.  Avoid taking birth control pills.  Talk to your health care provider about the risks of taking birth control pills if you are over 35 years old, smoke, get migraines, or have ever had a blood clot.  Get evaluated for sleep  disorders (sleep apnea).  Talk to your health care provider about getting a sleep evaluation if you snore a lot or have excessive sleepiness.  Take medicines only as directed by your health care provider.  For some people, aspirin or blood thinners (anticoagulants) are helpful in reducing the risk of forming abnormal blood clots that can lead to stroke. If you have the irregular heart rhythm of atrial fibrillation, you should be on a blood thinner unless there is a good reason you cannot take them.  Understand all your medicine instructions.  Make sure that other conditions (such as anemia or atherosclerosis) are addressed. SEEK IMMEDIATE MEDICAL CARE IF:   You have sudden weakness or numbness of the face, arm, or leg, especially on one side of the body.  Your face or eyelid droops to one side.  You have sudden confusion.  You have trouble speaking (aphasia) or understanding.  You have sudden trouble seeing in one or both eyes.  You have sudden trouble walking.  You have dizziness.  You have a loss of balance or coordination.  You have a sudden, severe headache with no known cause.  You have new chest pain or an irregular heartbeat. Any of these symptoms may represent a serious problem that is an emergency. Do not wait to see if the symptoms will go away. Get medical help at once. Call your local emergency services (911 in [place] Do not drive yourself to the hospital. Document Released: 07/15/2004 Document Revised: 10/22/2013 Document Reviewed: 12/08/2012 Exit[employer] Patient Information 2015 [employer]. This information is not intended to replace advice given   to you by your health care provider. Make sure you discuss any questions you have with your health care provider.  

## 2015-01-10 ENCOUNTER — Encounter: Payer: Self-pay | Admitting: Surgery

## 2015-01-13 ENCOUNTER — Encounter: Payer: Self-pay | Admitting: Surgery

## 2015-01-13 ENCOUNTER — Ambulatory Visit (INDEPENDENT_AMBULATORY_CARE_PROVIDER_SITE_OTHER): Payer: Medicare Other | Admitting: Surgery

## 2015-01-13 VITALS — BP 116/88 | HR 74 | Temp 97.7°F | Resp 16 | Ht 68.5 in | Wt 228.0 lb

## 2015-01-13 DIAGNOSIS — I6523 Occlusion and stenosis of bilateral carotid arteries: Secondary | ICD-10-CM

## 2015-01-13 NOTE — Progress Notes (Signed)
Patient name: Chad Huffman MRN: [id number] DOB: July 23, 1945 Sex: male     Chief Complaint  Patient presents with  . Re-evaluation    C/O  Vascular Lab results from January 09, 2015, Right ICA stenosis greater than 80-90%    HISTORY OF PRESENT ILLNESS: The patient is here for follow-up.  He is status post endovascular repair of his abdominal aortic aneurysm and 10/11/2007.  His aneurysm continues to decrease in size with the most recent diameter measurements of 3.9 cm.  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  He recently had progression of his right carotid stenosis to greater than 80%.  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  He has had spine and neck fusion.  This has limited his carotid duplex visualization as he cannot turn his neck.  He denies any symptoms.  Specifically he denies numbness or weakness in either extremity.  He denies amaurosis fugax.  He denies trouble swallowing and speaking.  The patient suffers from COPD secondary to smoking.  He quit smoking in 2008  Past Medical History  Diagnosis Date  . Antalgic gait   . Hypertension   . Lumbago   . COPD (chronic obstructive pulmonary disease)   . Complication of anesthesia     pt has had cervical fusion-limited neck flextion  . Depression   . DJD (degenerative joint disease)     Right shoulder  . Carotid artery occlusion   . Peripheral neuropathy   . Chronic neck and back pain     Past Surgical History  Procedure Laterality Date  . Tonsillectomy    . Carpal tunnel release      bilateral CTS  . Abdominal aortic aneurysm repair  2009    Endovascular AAA repair  . Pilonidal cyst / sinus excision    . Spine surgery  09/15/11    Scar tissue removed- back  . Spine surgery  09/15/11    Scar tissue removed- back  . Neck surgery    . Rotary cuff    . Hammer toe surgery  12/10/2011    Procedure: HAMMER TOE CORRECTION;  Surgeon: Ernestene Kiel,  DPM;  Location: [facility];  Service: Podiatry;  Laterality: Right;  Right hammertoe repair second right with 2.5x42 orthopro screw   . Metatarsal osteotomy  12/10/2011    Procedure: METATARSAL OSTEOTOMY;  Surgeon: Ernestene Kiel, DPM;  Location: [facility];  Service: Podiatry;  Laterality: Right;  Right Keller arthroplasty with size 5 silicone implant right great toe; second metatarsal osteotomy with 2.0x14 screw  . Abdominal aortic aneurysm repair      History   Social History  . Marital Status: Single    Spouse Name: N/A  . Number of Children: N/A  . Years of Education: N/A   Occupational History  . Not on file.   Social History Main Topics  . Smoking status: Former Smoker    Quit date: 08/30/2006  . Smokeless tobacco: Never Used  . Alcohol Use: 1.2 oz/week    2 Cans of beer per week     Comment: occasional use  . Drug Use: No  . Sexual Activity: Not on file   Other Topics Concern  . Not on file   Social History Narrative    Family History  Problem Relation Age of Onset  . Cancer Mother     Stomach  . Hypertension Brother   .  Heart attack Father     Allergies as of 01/13/2015 - Review Complete 01/13/2015  Allergen Reaction Noted  . Ampicillin Swelling 08/30/2011  . Brimonidine  08/04/2014  . Cyclobenzaprine Other (See Comments) 12/03/2011  . Tizanidine Other (See Comments) 11/26/2014  . Gabapentin Rash 06/25/2013    Current Outpatient Prescriptions on File Prior to Visit  Medication Sig Dispense Refill  . ALPRAZolam (XANAX) 0.5 MG tablet Take 0.5-0.75 mg by mouth 2 (two) times daily as needed for anxiety.    Marland Kitchen aspirin 81 MG tablet Take 81 mg by mouth daily.    . baclofen (LIORESAL) 20 MG tablet Take 20-30 mg by mouth every 3 (three) hours as needed for muscle spasms.    Marland Kitchen desonide (DESOWEN) 0.05 % cream Apply 0.05 % topically as needed.    . furosemide (LASIX) 40 MG tablet Take 1 tablet (40 mg total) by mouth 2 (two) times daily. 60  tablet 0  . ibuprofen (ADVIL,MOTRIN) 200 MG tablet Take 400 mg by mouth every 6 (six) hours as needed.    . Ipratropium-Albuterol (COMBIVENT RESPIMAT) 20-100 MCG/ACT AERS respimat Inhale 1 puff into the lungs every 6 (six) hours. 1 Inhaler 0  . ipratropium-albuterol (DUONEB) 0.5-2.5 (3) MG/3ML SOLN Take 3 mLs by nebulization every 6 (six) hours. 360 mL 0  . ketoconazole (NIZORAL) 2 % shampoo     . KRILL OIL PO Take by mouth daily.    Marland Kitchen levothyroxine (SYNTHROID, LEVOTHROID) 125 MCG tablet Take 1 tablet (125 mcg total) by mouth daily before breakfast. 30 tablet 0  . losartan (COZAAR) 50 MG tablet Take 50 mg by mouth daily.    . magnesium hydroxide (MILK OF MAGNESIA) 400 MG/5ML suspension Take by mouth daily as needed for mild constipation.    . Multiple Vitamin (MULTIVITAMIN) tablet Take 1 tablet by mouth daily.    Marland Kitchen OVER THE COUNTER MEDICATION Place 2 drops into both eyes daily.    Marland Kitchen oxycodone (OXY-IR) 5 MG capsule Take 5 mg by mouth every 4 (four) hours as needed for pain.    . polyethylene glycol (MIRALAX / GLYCOLAX) packet Take 17 g by mouth daily.     . sertraline (ZOLOFT) 50 MG tablet Take 50 mg by mouth daily.    . SYMBICORT 160-4.5 MCG/ACT inhaler Inhale 2 puffs into the lungs 2 (two) times daily.     . vitamin B-12 (CYANOCOBALAMIN) 1000 MCG tablet Take 1,000 mcg by mouth every 30 (thirty) days.    . promethazine (PHENERGAN) 12.5 MG tablet Take 2 tablets (25 mg total) by mouth every 6 (six) hours as needed for nausea. (Patient not taking: Reported on 11/26/2014) 30 tablet 0   No current facility-administered medications on file prior to visit.     REVIEW OF SYSTEMS:  Please see history of present illness.  Otherwise negative  PHYSICAL EXAMINATION:   Vital signs are  Filed Vitals:   01/13/15 0949 01/13/15 0952  BP: 104/76 116/88  Pulse: 72 74  Temp:  97.7 F (36.5 C)  TempSrc:  Oral  Resp:  16  Height:  5' 8.5" (1.74 m)  Weight:  228 lb (103.42 kg)  SpO2:  100%   Body mass  index is 34.16 kg/(m^2). General: The patient appears their stated age. HEENT:  No gross abnormalities Pulmonary:  Non labored breathing Abdomen: Soft and non-tender Musculoskeletal: There are no major deformities. Neurologic: No focal weakness or paresthesias are detected, Skin: There are no ulcer or rashes noted. Psychiatric: The patient has normal affect. Cardiovascular:  There is a regular rate and rhythm without significant murmur appreciated.  No carotid bruit   Diagnostic Studies Carotid duplex studies have been reviewed.  There is greater than 80% right carotid stenosis and less than 40% left carotid stenosis.  The distal right internal carotid artery could not be visualized given the patient's inability to move his neck.  Assessment: Asymptomatic right carotid stenosis Plan: The patient has had neck fusion which will make surgical endarterectomy possibly very difficult.  Ultrasound was not able to see beyond the stenosis given the patient's inability to turn his neck.  I discussed with the patient I think the next step is to proceed with carotid angiography to determine whether or not he is a stent candidate and to determine if his lesion is accessible surgically.  This has been scheduled for Tuesday, August 23.  This will be a diagnostic only study.  Based on these results, the decision for endarterectomy versus stenting will be made.  He will need to be started on Plavix if stenting is what is required  V. Charlena Cross, M.D. [facility] [place] Office: [phone] Pager:  [phone]

## 2015-01-14 ENCOUNTER — Other Ambulatory Visit: Payer: Self-pay

## 2015-02-11 ENCOUNTER — Other Ambulatory Visit: Payer: Self-pay | Admitting: *Deleted

## 2015-02-11 ENCOUNTER — Encounter (HOSPITAL_COMMUNITY)
Admission: RE | Disposition: A | Discharge status: Home / Self Care | Payer: Managed Care, Other (non HMO) | Source: Ambulatory Visit | Attending: Surgery

## 2015-02-11 ENCOUNTER — Encounter (HOSPITAL_COMMUNITY): Payer: Self-pay | Admitting: Surgery

## 2015-02-11 ENCOUNTER — Ambulatory Visit (HOSPITAL_COMMUNITY)
Admission: RE | Admit: 2015-02-11 | Discharge: 2015-02-11 | Disposition: A | Discharge status: Home / Self Care | Payer: Medicare Other | Source: Ambulatory Visit | Attending: Surgery | Admitting: Surgery

## 2015-02-11 DIAGNOSIS — R2689 Other abnormalities of gait and mobility: Secondary | ICD-10-CM | POA: Diagnosis not present

## 2015-02-11 DIAGNOSIS — Z79891 Long term (current) use of opiate analgesic: Secondary | ICD-10-CM | POA: Insufficient documentation

## 2015-02-11 DIAGNOSIS — I6521 Occlusion and stenosis of right carotid artery: Secondary | ICD-10-CM | POA: Insufficient documentation

## 2015-02-11 DIAGNOSIS — M19011 Primary osteoarthritis, right shoulder: Secondary | ICD-10-CM | POA: Insufficient documentation

## 2015-02-11 DIAGNOSIS — I6523 Occlusion and stenosis of bilateral carotid arteries: Secondary | ICD-10-CM

## 2015-02-11 DIAGNOSIS — Z791 Long term (current) use of non-steroidal anti-inflammatories (NSAID): Secondary | ICD-10-CM | POA: Insufficient documentation

## 2015-02-11 DIAGNOSIS — Z7982 Long term (current) use of aspirin: Secondary | ICD-10-CM | POA: Insufficient documentation

## 2015-02-11 DIAGNOSIS — G629 Polyneuropathy, unspecified: Secondary | ICD-10-CM | POA: Insufficient documentation

## 2015-02-11 DIAGNOSIS — Z981 Arthrodesis status: Secondary | ICD-10-CM | POA: Diagnosis not present

## 2015-02-11 DIAGNOSIS — Z87891 Personal history of nicotine dependence: Secondary | ICD-10-CM | POA: Diagnosis not present

## 2015-02-11 DIAGNOSIS — J449 Chronic obstructive pulmonary disease, unspecified: Secondary | ICD-10-CM | POA: Insufficient documentation

## 2015-02-11 DIAGNOSIS — F329 Major depressive disorder, single episode, unspecified: Secondary | ICD-10-CM | POA: Insufficient documentation

## 2015-02-11 DIAGNOSIS — I714 Abdominal aortic aneurysm, without rupture: Secondary | ICD-10-CM | POA: Diagnosis not present

## 2015-02-11 DIAGNOSIS — I10 Essential (primary) hypertension: Secondary | ICD-10-CM | POA: Diagnosis not present

## 2015-02-11 DIAGNOSIS — Z7951 Long term (current) use of inhaled steroids: Secondary | ICD-10-CM | POA: Diagnosis not present

## 2015-02-11 DIAGNOSIS — Z79899 Other long term (current) drug therapy: Secondary | ICD-10-CM | POA: Diagnosis not present

## 2015-02-11 HISTORY — PX: PERIPHERAL VASCULAR CATHETERIZATION: SHX172C

## 2015-02-11 LAB — POCT I-STAT, CHEM 8
BUN: 25 mg/dL — ABNORMAL HIGH (ref 6–20)
CALCIUM ION: 1.11 mmol/L — AB (ref 1.13–1.30)
Chloride: 93 mmol/L — ABNORMAL LOW (ref 101–111)
Creatinine, Ser: 1 mg/dL (ref 0.61–1.24)
GLUCOSE: 83 mg/dL (ref 65–99)
HCT: 39 % (ref 39.0–52.0)
HEMOGLOBIN: 13.3 g/dL (ref 13.0–17.0)
Potassium: 3.8 mmol/L (ref 3.5–5.1)
Sodium: 138 mmol/L (ref 135–145)
TCO2: 33 mmol/L (ref 0–100)

## 2015-02-11 SURGERY — CAROTID ANGIOGRAPHY
Anesthesia: LOCAL

## 2015-02-11 MED ORDER — GUAIFENESIN-DM 100-10 MG/5ML PO SYRP: 15.0000 mL | ORAL_SOLUTION | ORAL | Status: DC | PRN

## 2015-02-11 MED ORDER — HEPARIN (PORCINE) IN NACL 2-0.9 UNIT/ML-% IJ SOLN
INTRAMUSCULAR | Status: AC
  Filled 2015-02-11: qty 1000

## 2015-02-11 MED ORDER — HYDRALAZINE HCL 20 MG/ML IJ SOLN
INTRAMUSCULAR | Status: DC | PRN
  Administered 2015-02-11: 10 mg via INTRAVENOUS

## 2015-02-11 MED ORDER — DOCUSATE SODIUM 100 MG PO CAPS: 100.0000 mg | ORAL_CAPSULE | Freq: Every day | ORAL | Status: DC

## 2015-02-11 MED ORDER — ACETAMINOPHEN 325 MG RE SUPP
325.0000 mg | RECTAL | Status: DC | PRN
Start: 2015-02-11 — End: 2015-02-11

## 2015-02-11 MED ORDER — ALUM & MAG HYDROXIDE-SIMETH 200-200-20 MG/5ML PO SUSP: 15.0000 mL | ORAL | Status: DC | PRN

## 2015-02-11 MED ORDER — MORPHINE SULFATE (PF) 10 MG/ML IV SOLN: 2.0000 mg | INTRAVENOUS | Status: DC | PRN

## 2015-02-11 MED ORDER — LIDOCAINE HCL (PF) 1 % IJ SOLN
INTRAMUSCULAR | Status: DC | PRN
  Administered 2015-02-11: 9 mL

## 2015-02-11 MED ORDER — SODIUM CHLORIDE 0.9 % IV SOLN
INTRAVENOUS | Status: DC
  Administered 2015-02-11: 06:00:00 via INTRAVENOUS

## 2015-02-11 MED ORDER — LABETALOL HCL 5 MG/ML IV SOLN: 10.0000 mg | INTRAVENOUS | Status: DC | PRN

## 2015-02-11 MED ORDER — PHENOL 1.4 % MT LIQD: 1.0000 | OROMUCOSAL | Status: DC | PRN

## 2015-02-11 MED ORDER — HYDRALAZINE HCL 20 MG/ML IJ SOLN
INTRAMUSCULAR | Status: AC
  Filled 2015-02-11: qty 1

## 2015-02-11 MED ORDER — OXYCODONE HCL 5 MG PO TABS: 5.0000 mg | ORAL_TABLET | ORAL | Status: DC | PRN

## 2015-02-11 MED ORDER — SODIUM CHLORIDE 0.9 % IV SOLN: INTRAVENOUS | Status: DC

## 2015-02-11 MED ORDER — ACETAMINOPHEN 325 MG PO TABS
325.0000 mg | ORAL_TABLET | ORAL | Status: DC | PRN
Start: 2015-02-11 — End: 2015-02-11

## 2015-02-11 MED ORDER — HYDRALAZINE HCL 20 MG/ML IJ SOLN: 5.0000 mg | INTRAMUSCULAR | Status: DC | PRN

## 2015-02-11 MED ORDER — METOPROLOL TARTRATE 1 MG/ML IV SOLN: 2.0000 mg | INTRAVENOUS | Status: DC | PRN

## 2015-02-11 MED ORDER — ONDANSETRON HCL 4 MG/2ML IJ SOLN: 4.0000 mg | Freq: Four times a day (QID) | INTRAMUSCULAR | Status: DC | PRN

## 2015-02-11 MED ORDER — LIDOCAINE HCL (PF) 1 % IJ SOLN
INTRAMUSCULAR | Status: AC
  Filled 2015-02-11: qty 30

## 2015-02-11 SURGICAL SUPPLY — 15 items
CATH ANGIO 5F BER2 65CM (CATHETERS) ×2 IMPLANT
CATH ANGIO 5F PIGTAIL 100CM (CATHETERS) ×2 IMPLANT
CATH ANGIO 5F SIM1 100CM (CATHETERS) ×2 IMPLANT
COVER PRB 48X5XTLSCP FOLD TPE (BAG) ×1 IMPLANT
COVER PROBE 5X48 (BAG) ×1
DRAPE ZERO GRAVITY STERILE (DRAPES) ×2 IMPLANT
KIT MICROINTRODUCER STIFF 5F (SHEATH) ×2 IMPLANT
KIT PV (KITS) ×2 IMPLANT
SHEATH PINNACLE 5F 10CM (SHEATH) ×2 IMPLANT
SYR MEDRAD MARK V 150ML (SYRINGE) ×2 IMPLANT
TRANSDUCER W/STOPCOCK (MISCELLANEOUS) ×2 IMPLANT
TRAY PV CATH (CUSTOM PROCEDURE TRAY) ×2 IMPLANT
WIRE BENTSON .035X145CM (WIRE) ×2 IMPLANT
WIRE ROSEN-J .035X180CM (WIRE) ×2 IMPLANT
WIRE TORQFLEX AUST .018X40CM (WIRE) ×6 IMPLANT

## 2015-02-11 NOTE — H&P (View-Only) (Signed)
   Patient name: Chad Huffman MRN: [id number] DOB: 06/30/1945 Sex: male     Chief Complaint  Patient presents with  . Re-evaluation    C/O  Vascular Lab results from January 09, 2015, Right ICA stenosis greater than 80-90%    HISTORY OF PRESENT ILLNESS: The patient is here for follow-up.  He is status post endovascular repair of his abdominal aortic aneurysm and 10/11/2007.  His aneurysm continues to decrease in size with the most recent diameter measurements of 3.9 cm.  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  He recently had progression of his right carotid stenosis to greater than 80%.  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  He has had spine and neck fusion.  This has limited his carotid duplex visualization as he cannot turn his neck.  He denies any symptoms.  Specifically he denies numbness or weakness in either extremity.  He denies amaurosis fugax.  He denies trouble swallowing and speaking.  The patient suffers from COPD secondary to smoking.  He quit smoking in 2008  Past Medical History  Diagnosis Date  . Antalgic gait   . Hypertension   . Lumbago   . COPD (chronic obstructive pulmonary disease)   . Complication of anesthesia     pt has had cervical fusion-limited neck flextion  . Depression   . DJD (degenerative joint disease)     Right shoulder  . Carotid artery occlusion   . Peripheral neuropathy   . Chronic neck and back pain     Past Surgical History  Procedure Laterality Date  . Tonsillectomy    . Carpal tunnel release      bilateral CTS  . Abdominal aortic aneurysm repair  2009    Endovascular AAA repair  . Pilonidal cyst / sinus excision    . Spine surgery  09/15/11    Scar tissue removed- back  . Spine surgery  09/15/11    Scar tissue removed- back  . Neck surgery    . Rotary cuff    . Hammer toe surgery  12/10/2011    Procedure: HAMMER TOE CORRECTION;  Surgeon: Max Hyatt,  DPM;  Location: [facility];  Service: Podiatry;  Laterality: Right;  Right hammertoe repair second right with 2.5x42 orthopro screw   . Metatarsal osteotomy  12/10/2011    Procedure: METATARSAL OSTEOTOMY;  Surgeon: Max Hyatt, DPM;  Location: [facility];  Service: Podiatry;  Laterality: Right;  Right Keller arthroplasty with size 5 silicone implant right great toe; second metatarsal osteotomy with 2.0x14 screw  . Abdominal aortic aneurysm repair      History   Social History  . Marital Status: Single    Spouse Name: N/A  . Number of Children: N/A  . Years of Education: N/A   Occupational History  . Not on file.   Social History Main Topics  . Smoking status: Former Smoker    Quit date: 08/30/2006  . Smokeless tobacco: Never Used  . Alcohol Use: 1.2 oz/week    2 Cans of beer per week     Comment: occasional use  . Drug Use: No  . Sexual Activity: Not on file   Other Topics Concern  . Not on file   Social History Narrative    Family History  Problem Relation Age of Onset  . Cancer Mother     Stomach  . Hypertension Brother   .   Heart attack Father     Allergies as of 01/13/2015 - Review Complete 01/13/2015  Allergen Reaction Noted  . Ampicillin Swelling 08/30/2011  . Brimonidine  08/04/2014  . Cyclobenzaprine Other (See Comments) 12/03/2011  . Tizanidine Other (See Comments) 11/26/2014  . Gabapentin Rash 06/25/2013    Current Outpatient Prescriptions on File Prior to Visit  Medication Sig Dispense Refill  . ALPRAZolam (XANAX) 0.5 MG tablet Take 0.5-0.75 mg by mouth 2 (two) times daily as needed for anxiety.    . aspirin 81 MG tablet Take 81 mg by mouth daily.    . baclofen (LIORESAL) 20 MG tablet Take 20-30 mg by mouth every 3 (three) hours as needed for muscle spasms.    . desonide (DESOWEN) 0.05 % cream Apply 0.05 % topically as needed.    . furosemide (LASIX) 40 MG tablet Take 1 tablet (40 mg total) by mouth 2 (two) times daily. 60  tablet 0  . ibuprofen (ADVIL,MOTRIN) 200 MG tablet Take 400 mg by mouth every 6 (six) hours as needed.    . Ipratropium-Albuterol (COMBIVENT RESPIMAT) 20-100 MCG/ACT AERS respimat Inhale 1 puff into the lungs every 6 (six) hours. 1 Inhaler 0  . ipratropium-albuterol (DUONEB) 0.5-2.5 (3) MG/3ML SOLN Take 3 mLs by nebulization every 6 (six) hours. 360 mL 0  . ketoconazole (NIZORAL) 2 % shampoo     . KRILL OIL PO Take by mouth daily.    . levothyroxine (SYNTHROID, LEVOTHROID) 125 MCG tablet Take 1 tablet (125 mcg total) by mouth daily before breakfast. 30 tablet 0  . losartan (COZAAR) 50 MG tablet Take 50 mg by mouth daily.    . magnesium hydroxide (MILK OF MAGNESIA) 400 MG/5ML suspension Take by mouth daily as needed for mild constipation.    . Multiple Vitamin (MULTIVITAMIN) tablet Take 1 tablet by mouth daily.    . OVER THE COUNTER MEDICATION Place 2 drops into both eyes daily.    . oxycodone (OXY-IR) 5 MG capsule Take 5 mg by mouth every 4 (four) hours as needed for pain.    . polyethylene glycol (MIRALAX / GLYCOLAX) packet Take 17 g by mouth daily.     . sertraline (ZOLOFT) 50 MG tablet Take 50 mg by mouth daily.    . SYMBICORT 160-4.5 MCG/ACT inhaler Inhale 2 puffs into the lungs 2 (two) times daily.     . vitamin B-12 (CYANOCOBALAMIN) 1000 MCG tablet Take 1,000 mcg by mouth every 30 (thirty) days.    . promethazine (PHENERGAN) 12.5 MG tablet Take 2 tablets (25 mg total) by mouth every 6 (six) hours as needed for nausea. (Patient not taking: Reported on 11/26/2014) 30 tablet 0   No current facility-administered medications on file prior to visit.     REVIEW OF SYSTEMS:  Please see history of present illness.  Otherwise negative  PHYSICAL EXAMINATION:   Vital signs are  Filed Vitals:   01/13/15 0949 01/13/15 0952  BP: 104/76 116/88  Pulse: 72 74  Temp:  97.7 F (36.5 C)  TempSrc:  Oral  Resp:  16  Height:  5' 8.5" (1.74 m)  Weight:  228 lb (103.42 kg)  SpO2:  100%   Body mass  index is 34.16 kg/(m^2). General: The patient appears their stated age. HEENT:  No gross abnormalities Pulmonary:  Non labored breathing Abdomen: Soft and non-tender Musculoskeletal: There are no major deformities. Neurologic: No focal weakness or paresthesias are detected, Skin: There are no ulcer or rashes noted. Psychiatric: The patient has normal affect. Cardiovascular:   There is a regular rate and rhythm without significant murmur appreciated.  No carotid bruit   Diagnostic Studies Carotid duplex studies have been reviewed.  There is greater than 80% right carotid stenosis and less than 40% left carotid stenosis.  The distal right internal carotid artery could not be visualized given the patient's inability to move his neck.  Assessment: Asymptomatic right carotid stenosis Plan: The patient has had neck fusion which will make surgical endarterectomy possibly very difficult.  Ultrasound was not able to see beyond the stenosis given the patient's inability to turn his neck.  I discussed with the patient I think the next step is to proceed with carotid angiography to determine whether or not he is a stent candidate and to determine if his lesion is accessible surgically.  This has been scheduled for Tuesday, August 23.  This will be a diagnostic only study.  Based on these results, the decision for endarterectomy versus stenting will be made.  He will need to be started on Plavix if stenting is what is required  V. Wells Brabham IV, M.D. [facility]  Office: [phone] Pager:  [phone]    

## 2015-02-11 NOTE — Interval H&P Note (Signed)
History and Physical Interval Note:  02/11/2015 7:39 AM  Chad Huffman  has presented today for surgery, with the diagnosis of right carotid stenosis  The various methods of treatment have been discussed with the patient and family. After consideration of risks, benefits and other options for treatment, the patient has consented to  Procedure(s): Carotid Angiography (N/A) as a surgical intervention .  The patient's history has been reviewed, patient examined, no change in status, stable for surgery.  I have reviewed the patient's chart and labs.  Questions were answered to the patient's satisfaction.     Durene Cal

## 2015-02-11 NOTE — Discharge Instructions (Signed)

## 2015-02-11 NOTE — Progress Notes (Signed)
Site area: rt groin fa sheath Site Prior to Removal:  Level 0 Pressure Applied For:  20 minutes Manual:   yes Patient Status During Pull:  stable Post Pull Site:  Level  0 Post Pull Instructions Given:  yes Post Pull Pulses Present: yes Dressing Applied: tegaderm  Bedrest begins @  0945 Comments:  0. Neuro assessment unchanged.

## 2015-02-11 NOTE — Op Note (Addendum)
     Patient name: Chad Huffman MRN: [id number] DOB: March 19, 1946 Sex: male  02/11/2015 Pre-operative Diagnosis: Carotid Stenosis Post-operative diagnosis:  Same Surgeon:  Durene Cal Procedure Performed:  1.  Ultrasound-guided access, right femoral artery  2.  Aortic arch angiogram  3.  Second order catheterization (right common carotid artery)  4.  Right carotid angiogram   5.  First order catheterization (left common carotid artery)  6.  Left carotid angiogram    Indications:  The patient was found to have progression of his right carotid stenosis by ultrasound.  Ultrasound could not identify the carotid artery distal to the stenosis on the right.  He is here for further evaluation.  He has had a neck fusion and cannot turn his neck.  Procedure:  The patient was identified in the holding area and taken to room 8.  The patient was then placed supine on the table and prepped and draped in the usual sterile fashion.  A time out was called.  Ultrasound was used to evaluate the right common femoral artery.  It was patent .  A digital ultrasound image was acquired.  A micropuncture needle was used to access the right common femoral artery under ultrasound guidance.  An 018 wire was advanced without resistance and a micropuncture sheath was placed.  The 018 wire was removed and a benson wire was placed.  The micropuncture sheath was exchanged for a 5 french sheath.  A pigtail catheter was advanced over the wire into the ascending aorta and an aortic arch angiogram was performed.  Next the right common carotid artery was selected with a Berenstein 2 catheter and right carotid angiogram was performed.  The left carotid artery was cannulated with a Simmons 1 catheter and a left carotid antrum was performed.  Intracranial images were also obtained.  They will be separately dictated by neuroradiology.  Findings:   Aortic arch:  A type I aortic arch is identified.  No significant great vessel stenosis is  identified after ostium.  The visualized portion of the left subclavian artery are patent without significant stenosis.  The innominate artery and right subclavian artery are patent.  There is no significant stenosis.  Right carotid artery:  The right common carotid artery is widely patent.  At the bifurcation extending into the internal carotid artery there is significant ulceration.  Definitive degree of stenosis is difficult to measure given the ulcer but suspected to be 70- 80%.  The distal internal carotid artery is widely patent.  Left carotid artery:  Common carotid and external carotid artery widely patent.  The bifurcation there is less than 50% stenosis present.  Intervention:  None  Impression:  #1  ulcerated plaque at the right carotid bifurcation with 70-80 percent stenosis.  #2  the location of the stenosis in conjunction with the patient's neck fusion would make surgical endarterectomy high risk.  If consideration is made for repair of his carotid stenosis, stenting would be favored.  He also has anatomy that we'll make stenting challenging given there are 2 areas of tortuosity.  Therefore recommendation has been made for continued medical management.  If the stenosis progresses, carotid stenting would be attempted.   Juleen China, M.D. [facility] [place] Office: [phone] Pager:  [phone]

## 2015-02-12 MED FILL — Heparin Sodium (Porcine) 2 Unit/ML in Sodium Chloride 0.9%: INTRAMUSCULAR | Qty: 1000 | Status: AC

## 2015-02-13 ENCOUNTER — Telehealth: Payer: Self-pay | Admitting: Surgery

## 2015-02-13 NOTE — Telephone Encounter (Signed)
Spoke with pt to schedule, dpm °

## 2015-02-13 NOTE — Telephone Encounter (Signed)
-----   Message from Sharee Pimple, RN sent at 02/11/2015  3:17 PM EDT ----- Regarding: Schedule   ----- Message -----    From: Nada Libman, MD    Sent: 02/11/2015  11:01 AM      To: [facility] Charge Pool   02/11/2015: follow-up 6 months with carotid ultrasound.

## 2015-02-25 NOTE — Consult Note (Signed)
NAMETREVELLE, MCGURN                 ACCOUNT NO.:  [id number]  MEDICAL RECORD NO.:  [id number]  LOCATION:  [facility]                         FACILITY:  [facility]  PHYSICIAN:  Kristyl Athens K. Aikeem Lilley, M.D.DATE OF BIRTH:  03-29-1946  DATE OF CONSULTATION:  02/21/2015 DATE OF DISCHARGE:  02/11/2015                                CONSULTATION   CLINICAL HISTORY:  Carotid artery stenosis on the right side.  EXAMINATION:  Intracranial interpretation of bilateral common carotid arteriograms.  FINDINGS:  The right common carotid arteriogram demonstrates the right internal carotid artery in the cervical, petrous, and supraclinoid segments to be widely patent.  There is small saccular outpouching along the inferior aspect of the proximal cavernous segment probably representing a small aneurysm.  The right middle and the right anterior cerebral arteries opacify normally into the capillary and venous phases.  The left common carotid arteriogram demonstrates the left internal carotid artery in the distal cervical petrous segment to be normal.  The left middle cerebral artery and left anterior cerebral artery opacify normally into the capillary and the venous phases.  IMPRESSION: 1. No angiographic evidence to suggest significant stenosis,     occlusion, dissection, or intraluminal filling defects. 2. Suggestion of a small aneurysm arising from the inferior aspect of     the proximal cavernous segment of the right internal carotid     artery.          ______________________________ Grandville Silos Corliss Skains, M.D.     SKD/MEDQ  D:  02/21/2015  T:  02/21/2015  Job:  [id number]

## 2015-04-16 ENCOUNTER — Emergency Department
Admission: EM | Admit: 2015-04-16 | Discharge: 2015-04-16 | Disposition: A | Discharge status: Home / Self Care | Payer: Medicare Other | Attending: Emergency Medicine | Admitting: Emergency Medicine

## 2015-04-16 ENCOUNTER — Emergency Department: Payer: Medicare Other

## 2015-04-16 ENCOUNTER — Encounter: Payer: Self-pay | Admitting: Intensive Care

## 2015-04-16 DIAGNOSIS — Z880 Allergy status to penicillin: Secondary | ICD-10-CM | POA: Insufficient documentation

## 2015-04-16 DIAGNOSIS — I10 Essential (primary) hypertension: Secondary | ICD-10-CM | POA: Diagnosis not present

## 2015-04-16 DIAGNOSIS — M549 Dorsalgia, unspecified: Secondary | ICD-10-CM

## 2015-04-16 DIAGNOSIS — Z87891 Personal history of nicotine dependence: Secondary | ICD-10-CM | POA: Insufficient documentation

## 2015-04-16 DIAGNOSIS — Z7982 Long term (current) use of aspirin: Secondary | ICD-10-CM | POA: Diagnosis not present

## 2015-04-16 DIAGNOSIS — G8929 Other chronic pain: Secondary | ICD-10-CM | POA: Diagnosis not present

## 2015-04-16 DIAGNOSIS — Z79899 Other long term (current) drug therapy: Secondary | ICD-10-CM | POA: Insufficient documentation

## 2015-04-16 DIAGNOSIS — Z7951 Long term (current) use of inhaled steroids: Secondary | ICD-10-CM | POA: Insufficient documentation

## 2015-04-16 DIAGNOSIS — N289 Disorder of kidney and ureter, unspecified: Secondary | ICD-10-CM | POA: Insufficient documentation

## 2015-04-16 DIAGNOSIS — M545 Low back pain: Secondary | ICD-10-CM | POA: Diagnosis present

## 2015-04-16 LAB — CBC WITH DIFFERENTIAL/PLATELET
BASOS ABS: 0.1 10*3/uL (ref 0–0.1)
BASOS PCT: 1 %
Eosinophils Absolute: 0.1 10*3/uL (ref 0–0.7)
Eosinophils Relative: 2 %
HEMATOCRIT: 37.5 % — AB (ref 40.0–52.0)
Hemoglobin: 12.1 g/dL — ABNORMAL LOW (ref 13.0–18.0)
LYMPHS PCT: 12 %
Lymphs Abs: 0.9 10*3/uL — ABNORMAL LOW (ref 1.0–3.6)
MCH: 29.7 pg (ref 26.0–34.0)
MCHC: 32.4 g/dL (ref 32.0–36.0)
MCV: 91.9 fL (ref 80.0–100.0)
Monocytes Absolute: 1 10*3/uL (ref 0.2–1.0)
Monocytes Relative: 13 %
NEUTROS ABS: 5.3 10*3/uL (ref 1.4–6.5)
Neutrophils Relative %: 72 %
PLATELETS: 142 10*3/uL — AB (ref 150–440)
RBC: 4.08 MIL/uL — AB (ref 4.40–5.90)
RDW: 15.4 % — AB (ref 11.5–14.5)
WBC: 7.3 10*3/uL (ref 3.8–10.6)

## 2015-04-16 LAB — BASIC METABOLIC PANEL
ANION GAP: 9 (ref 5–15)
BUN: 31 mg/dL — ABNORMAL HIGH (ref 6–20)
CALCIUM: 9.3 mg/dL (ref 8.9–10.3)
CO2: 31 mmol/L (ref 22–32)
Chloride: 99 mmol/L — ABNORMAL LOW (ref 101–111)
Creatinine, Ser: 1.43 mg/dL — ABNORMAL HIGH (ref 0.61–1.24)
GFR, EST AFRICAN AMERICAN: 57 mL/min — AB (ref >60)
GFR, EST NON AFRICAN AMERICAN: 49 mL/min — AB (ref >60)
Glucose, Bld: 93 mg/dL (ref 65–99)
POTASSIUM: 4.3 mmol/L (ref 3.5–5.1)
Sodium: 139 mmol/L (ref 135–145)

## 2015-04-16 LAB — URINALYSIS COMPLETE WITH MICROSCOPIC (ARMC ONLY)
Bacteria, UA: NONE SEEN
GLUCOSE, UA: NEGATIVE mg/dL
HGB URINE DIPSTICK: NEGATIVE
KETONES UR: NEGATIVE mg/dL
Nitrite: NEGATIVE
PH: 7 (ref 5.0–8.0)
Protein, ur: 30 mg/dL — AB
Specific Gravity, Urine: 1.02 (ref 1.005–1.030)

## 2015-04-16 MED ORDER — MORPHINE SULFATE (PF) 4 MG/ML IV SOLN
4.0000 mg | Freq: Once | INTRAVENOUS | Status: DC
  Filled 2015-04-16: qty 1

## 2015-04-16 MED ORDER — SODIUM CHLORIDE 0.9 % IV BOLUS (SEPSIS)
250.0000 mL | Freq: Once | INTRAVENOUS | Status: AC
  Administered 2015-04-16: 250 mL via INTRAVENOUS

## 2015-04-16 NOTE — Discharge Instructions (Signed)
Back Pain, Adult There kidney function is somewhat elevated at 1.4 this is not dangerous but needs to be rechecked please follow-up with your doctor tomorrow for recheck of your creatinine. Drink plenty of fluids and return to the emergency room if you feel worse in any way Back pain is very common in adults.The cause of back pain is rarely dangerous and the pain often gets better over time.The cause of your back pain may not be known. Some common causes of back pain include:  Strain of the muscles or ligaments supporting the spine.  Wear and tear (degeneration) of the spinal disks.  Arthritis.  Direct injury to the back. For many people, back pain may return. Since back pain is rarely dangerous, most people can learn to manage this condition on their own. HOME CARE INSTRUCTIONS Watch your back pain for any changes. The following actions may help to lessen any discomfort you are feeling:  Remain active. It is stressful on your back to sit or stand in one place for long periods of time. Do not sit, drive, or stand in one place for more than 30 minutes at a time. Take short walks on even surfaces as soon as you are able.Try to increase the length of time you walk each day.  Exercise regularly as directed by your health care provider. Exercise helps your back heal faster. It also helps avoid future injury by keeping your muscles strong and flexible.  Do not stay in bed.Resting more than 1-2 days can delay your recovery.  Pay attention to your body when you bend and lift. The most comfortable positions are those that put less stress on your recovering back. Always use proper lifting techniques, including:  Bending your knees.  Keeping the load close to your body.  Avoiding twisting.  Find a comfortable position to sleep. Use a firm mattress and lie on your side with your knees slightly bent. If you lie on your back, put a pillow under your knees.  Avoid feeling anxious or  stressed.Stress increases muscle tension and can worsen back pain.It is important to recognize when you are anxious or stressed and learn ways to manage it, such as with exercise.  Take medicines only as directed by your health care provider. Over-the-counter medicines to reduce pain and inflammation are often the most helpful.Your health care provider may prescribe muscle relaxant drugs.These medicines help dull your pain so you can more quickly return to your normal activities and healthy exercise.  Apply ice to the injured area:  Put ice in a plastic bag.  Place a towel between your skin and the bag.  Leave the ice on for 20 minutes, 2-3 times a day for the first 2-3 days. After that, ice and heat may be alternated to reduce pain and spasms.  Maintain a healthy weight. Excess weight puts extra stress on your back and makes it difficult to maintain good posture. SEEK MEDICAL CARE IF:  You have pain that is not relieved with rest or medicine.  You have increasing pain going down into the legs or buttocks.  You have pain that does not improve in one week.  You have night pain.  You lose weight.  You have a fever or chills. SEEK IMMEDIATE MEDICAL CARE IF:   You develop new bowel or bladder control problems.  You have unusual weakness or numbness in your arms or legs.  You develop nausea or vomiting.  You develop abdominal pain.  You feel faint.   This  information is not intended to replace advice given to you by your health care provider. Make sure you discuss any questions you have with your health care provider.   Document Released: 06/07/2005 Document Revised: 06/28/2014 Document Reviewed: 10/09/2013 Elsevier Interactive Patient Education [employer].

## 2015-04-16 NOTE — ED Notes (Signed)
PAtient arrived by EMS from home. PAtient states "I went to stand up and my back gave out, so I fell straight down". PAtient has HX of back surgery, and COPD and uses walker at home. PAtient uses oxygen as needed at home

## 2015-04-16 NOTE — ED Provider Notes (Addendum)
[facility] Emergency Department Provider Note  ____________________________________________   I have reviewed the triage vital signs and the nursing notes.   HISTORY  Chief Complaint Back Pain    HPI Chad Huffman is a 69 y.o. male who presents today complaining of low back pain. He has chronic low back pain. He states that he was walking and his back "gave out on him", this has happened to him many times before. He states he slept in a chair last night and he thinks he "messed up" his are ready "messed up" back. He did not have a numbness or weakness. Did not hit his head. He landed on his knees and then sat on his bottom. He describes his pain as 3 out of 10 at this time. He does normally take oxycodone for pain when he needs it. He is asking for pain medication. He states he did not take his oxycodone yet today. He denies any incontinence of bowel or bladder.  Past Medical History  Diagnosis Date  . Antalgic gait   . Hypertension   . Lumbago   . COPD (chronic obstructive pulmonary disease) (HCC)   . Complication of anesthesia     pt has had cervical fusion-limited neck flextion  . Depression   . DJD (degenerative joint disease)     Right shoulder  . Carotid artery occlusion   . Peripheral neuropathy (HCC)   . Chronic neck and back pain     Patient Active Problem List   Diagnosis Date Noted  . Cervical vertebral fusion   . History of lumbar laminectomy   . Chronic respiratory acidosis   . Depression   . Chronic neck and back pain   . Acute on chronic respiratory failure with hypercapnia (HCC)   . Acute on chronic respiratory failure with hypoxia (HCC)   . COPD exacerbation (HCC)   . HCAP (healthcare-associated pneumonia)   . Congestive dilated cardiomyopathy (HCC)   . Syncope and collapse   . Hyponatremia   . Other specified hypothyroidism   . Acute respiratory failure with hypoxia and hypercarbia (HCC) 08/04/2014  . S/P cervical spinal  fusion 08/04/2014  . S/P lumbar laminectomy 08/04/2014  . Right shoulder pain 08/04/2014  . Arthritis of right hip 08/04/2014  . Essential hypertension 08/04/2014  . Hypothyroidism 08/04/2014  . Syncope 08/04/2014  . Fall 08/04/2014  . Low back pain 08/04/2014  . Pedal edema 08/04/2014  . Occlusion and stenosis of carotid artery without mention of cerebral infarction 12/10/2013  . Aftercare following surgery of the circulatory system, NEC 12/10/2013  . Pain of right upper extremity-Right Neck-Shoulder-Back 12/10/2013  . Abdominal aneurysm without mention of rupture 12/03/2011    Past Surgical History  Procedure Laterality Date  . Tonsillectomy    . Carpal tunnel release      bilateral CTS  . Abdominal aortic aneurysm repair  2009    Endovascular AAA repair  . Pilonidal cyst / sinus excision    . Spine surgery  09/15/11    Scar tissue removed- back  . Spine surgery  09/15/11    Scar tissue removed- back  . Neck surgery    . Rotary cuff    . Hammer toe surgery  12/10/2011    Procedure: HAMMER TOE CORRECTION;  Surgeon: Ernestene Kiel, DPM;  Location:  SUR[facility]ERY [facility];  Service: Podiatry;  Laterality: Right;  Right hammertoe repair second right with 2.5x42 orthopro screw   . Metatarsal osteotomy  12/10/2011    Procedure:  METATARSAL OSTEOTOMY;  Surgeon: Ernestene Kiel, DPM;  Location: [facility];  Service: Podiatry;  Laterality: Right;  Right Keller arthroplasty with size 5 silicone implant right great toe; second metatarsal osteotomy with 2.0x14 screw  . Abdominal aortic aneurysm repair    . Peripheral vascular catheterization N/A 02/11/2015    Procedure: Carotid Angiography;  Surgeon: Nada Libman, MD;  Location: [facility] INVASIVE CV LAB;  Service: Cardiovascular;  Laterality: N/A;    Current Outpatient Rx  Name  Route  Sig  Dispense  Refill  . ALPRAZolam (XANAX) 0.5 MG tablet   Oral   Take 0.25-0.5 mg by mouth at bedtime.          Marland Kitchen aspirin EC 81 MG tablet    Oral   Take 81 mg by mouth daily.         . baclofen (LIORESAL) 20 MG tablet   Oral   Take 20-30 mg by mouth every 3 (three) hours as needed for muscle spasms.         Marland Kitchen desonide (DESOWEN) 0.05 % cream   Topical   Apply 0.05 % topically 2 (two) times daily as needed (irritation).          . furosemide (LASIX) 40 MG tablet   Oral   Take 1 tablet (40 mg total) by mouth 2 (two) times daily. Patient taking differently: Take 40 mg by mouth daily.    60 tablet   0   . ketoconazole (NIZORAL) 2 % shampoo   Topical   Apply 1 application topically daily as needed for irritation.          Marland Kitchen KRILL OIL PO   Oral   Take 1 capsule by mouth daily.          Marland Kitchen levothyroxine (SYNTHROID, LEVOTHROID) 125 MCG tablet   Oral   Take 1 tablet (125 mcg total) by mouth daily before breakfast.   30 tablet   0   . magnesium hydroxide (MILK OF MAGNESIA) 400 MG/5ML suspension   Oral   Take 30 mLs by mouth daily as needed for mild constipation or moderate constipation.          . Multiple Vitamin (MULTIVITAMIN) tablet   Oral   Take 1 tablet by mouth daily.         Marland Kitchen omeprazole (PRILOSEC) 20 MG capsule   Oral   Take 20 mg by mouth daily.         Marland Kitchen oxyCODONE (OXY IR/ROXICODONE) 5 MG immediate release tablet   Oral   Take 5 mg by mouth every 4 (four) hours as needed for severe pain.         . polyethylene glycol (MIRALAX / GLYCOLAX) packet   Oral   Take 17 g by mouth daily.          . sertraline (ZOLOFT) 100 MG tablet   Oral   Take 100 mg by mouth daily.         . SYMBICORT 160-4.5 MCG/ACT inhaler   Inhalation   Inhale 2 puffs into the lungs 2 (two) times daily.          . Ipratropium-Albuterol (COMBIVENT RESPIMAT) 20-100 MCG/ACT AERS respimat   Inhalation   Inhale 1 puff into the lungs every 6 (six) hours. Patient not taking: Reported on 04/16/2015   1 Inhaler   0     For use when you are away from home and can't use  ...   . ipratropium-albuterol (DUONEB)  0.5-2.5 (  3) MG/3ML SOLN   Nebulization   Take 3 mLs by nebulization every 6 (six) hours. Patient not taking: Reported on 04/16/2015   360 mL   0     Allergies Ampicillin; Brimonidine; Cyclobenzaprine; Tizanidine; and Gabapentin  Family History  Problem Relation Age of Onset  . Cancer Mother     Stomach  . Hypertension Brother   . Heart attack Father     Social History Social History  Substance Use Topics  . Smoking status: Former Smoker    Quit date: 08/30/2006  . Smokeless tobacco: Never Used  . Alcohol Use: 1.2 oz/week    2 Cans of beer per week     Comment: occasional use    Review of Systems Constitutional: No fever/chills Eyes: No visual changes. ENT: No sore throat. No stiff neck no neck pain Cardiovascular: Denies chest pain. Respiratory: Denies shortness of breath. Gastrointestinal:   no vomiting.  No diarrhea.  No constipation. Genitourinary: Negative for dysuria. Musculoskeletal: Negative lower extremity swelling Skin: Negative for rash. Neurological: Negative for headaches, focal weakness or numbness. 10-point ROS otherwise negative.  ____________________________________________   PHYSICAL EXAM:  VITAL SIGNS: ED Triage Vitals  Enc Vitals Group     BP 04/16/15 0922 171/89 mmHg     Pulse Rate 04/16/15 0918 64     Resp 04/16/15 0918 14     Temp 04/16/15 0918 98.4 F (36.9 C)     Temp Source 04/16/15 0918 Oral     SpO2 04/16/15 0918 92 %     Weight 04/16/15 0918 230 lb (104.327 kg)     Height 04/16/15 0918 5\' 10"  (1.778 m)     Head Cir --      Peak Flow --      Pain Score 04/16/15 0924 3     Pain Loc --      Pain Edu? --      Excl. in GC? --     Constitutional: Alert and oriented. Well appearing and in no acute distress. Eyes: Conjunctivae are normal. PERRL. EOMI. Head: Atraumatic. Nose: No congestion/rhinnorhea. Mouth/Throat: Mucous membranes are moist.  Oropharynx non-erythematous. Neck: No stridor.   Nontender with no  meningismus Cardiovascular: Normal rate, regular rhythm. Grossly normal heart sounds.  Good peripheral circulation. Respiratory: Normal respiratory effort.  No retractions. Lungs CTAB. Gastrointestinal: Soft and nontender. No distention. No guarding no reboundThere is some tenderness to palpation in the paraspinal muscles of the left lower back,r step off there is no midline tenderness there are no lesions noted. there is no CVA tenderness  Musculoskeletal: No lower extremity tenderness. No joint effusions, no DVT signs strong distal pulses no edema Neurologic:  Normal speech and language. No gross focal neurologic deficits are appreciated.  patient is a baseline right upper from a weakness which is unchanged, he has no lower extremity weakness, reflexes are symmetric, strong distal pulses, no saddle anesthesia declines rectal exam  Skin:  Skin is warm, dry and intactvery superficial erythema noted to knees consistent with fallingychiatric: Mood and affect are normal. Speech and behavior are normal.  ____________________________________________   LABS (all labs ordered are listed, but only abnormal results are displayed)  Labs Reviewed  CBC WITH DIFFERENTIAL/PLATELET - Abnormal; Notable for the following:    RBC 4.08 (*)    Hemoglobin 12.1 (*)    HCT 37.5 (*)    RDW 15.4 (*)    Platelets 142 (*)    Lymphs Abs 0.9 (*)    All other components within normal  limits  BASIC METABOLIC PANEL  URINALYSIS COMPLETEWITH MICROSCOPIC ([facility] ONLY)   ____________________________________________  EKG   ____________________________________________  RADIOLOGY   I personally review_____________________________________   PROCEDURES  Procedure(s) performed: None  Critical Care performed: None  ____________________________________________   INITIAL IMPRESSION / ASSESSMENT AND PLAN / ED COURSE  Pertinent labs & imaging results that were available during my care of the patient were reviewed  by me and considered in my medical decision making (see chart for details).  patient with low back pain which sometimes makes him fall and this is happened to him before. No evidence of knee injury other there are some slight abrasions to the knees.  He is neurologically intact with no evidence of cauda equina syndrome there is no evidence of acute fracture. I we'll obtain imaging given his age to make sure that there is no evidence of retroperitoneal pathology is vital signs however are reassuring, we will check basic blood work as a precaution and reassess. This was a non-syncopal event he was awake the whole time and he did not hit his head. ____________________________________________  ----------------------------------------- 10:54 AM on 04/16/2015 -----------------------------------------   Patient remains without any significant discomfort declined pain medications, CT scan doesn't show any evidence of retroperitoneal bleed, neurologically he remains unchanged from his baseline, he has slightly worsening kidney function over the last 2 months but is unclear whether that is an acute issue and patient is not suffering any symptoms at this time. We will check a urine on him. There is no evidence of fracture and again this is a non-syncopal event. He had low back pain which caused him to fall he states. Vital signs are reassuring. Oxygen saturation ways awake and taking deep breaths is in the mid to high 90s but when he is asleep, sats are in the lower 90s, patient has a very obese body habitus. I may give him a small bolus of IV fluid given his renal function and he may need to have that rechecked in the next day or 2  ----------------------------------------- 1:35 PM on 04/16/2015 -----------------------------------------  Patient able to angulate with a walker with no pain or difficulty neurologically and vascularly intact, blood work is reassuring aside from a slight bump in his creatinine. I'm  giving him ginger IV fluid and he will follow-up with his doctor for a recheck. Return precautions and follow-up given and understood.  FINAL CLINICAL IMPRESSION(S) / ED DIAGNOSES  Final diagnoses:  Back pain     Jeanmarie PlantJames A McShane, MD 04/16/15 1010  Jeanmarie PlantJames A McShane, MD 04/16/15 1123  Jeanmarie PlantJames A McShane, MD 04/16/15 1335

## 2015-04-23 ENCOUNTER — Emergency Department: Payer: Medicare Other

## 2015-04-23 ENCOUNTER — Emergency Department
Admission: EM | Admit: 2015-04-23 | Discharge: 2015-04-23 | Disposition: A | Discharge status: Home / Self Care | Payer: Medicare Other | Attending: Emergency Medicine | Admitting: Emergency Medicine

## 2015-04-23 DIAGNOSIS — Z7951 Long term (current) use of inhaled steroids: Secondary | ICD-10-CM | POA: Insufficient documentation

## 2015-04-23 DIAGNOSIS — Y998 Other external cause status: Secondary | ICD-10-CM | POA: Insufficient documentation

## 2015-04-23 DIAGNOSIS — I10 Essential (primary) hypertension: Secondary | ICD-10-CM | POA: Insufficient documentation

## 2015-04-23 DIAGNOSIS — Z87891 Personal history of nicotine dependence: Secondary | ICD-10-CM | POA: Insufficient documentation

## 2015-04-23 DIAGNOSIS — Z880 Allergy status to penicillin: Secondary | ICD-10-CM | POA: Diagnosis not present

## 2015-04-23 DIAGNOSIS — Y9289 Other specified places as the place of occurrence of the external cause: Secondary | ICD-10-CM | POA: Diagnosis not present

## 2015-04-23 DIAGNOSIS — Z7982 Long term (current) use of aspirin: Secondary | ICD-10-CM | POA: Insufficient documentation

## 2015-04-23 DIAGNOSIS — Y9389 Activity, other specified: Secondary | ICD-10-CM | POA: Diagnosis not present

## 2015-04-23 DIAGNOSIS — M545 Low back pain, unspecified: Secondary | ICD-10-CM

## 2015-04-23 DIAGNOSIS — X58XXXA Exposure to other specified factors, initial encounter: Secondary | ICD-10-CM | POA: Insufficient documentation

## 2015-04-23 DIAGNOSIS — S3992XA Unspecified injury of lower back, initial encounter: Secondary | ICD-10-CM | POA: Insufficient documentation

## 2015-04-23 DIAGNOSIS — Z79899 Other long term (current) drug therapy: Secondary | ICD-10-CM | POA: Insufficient documentation

## 2015-04-23 MED ORDER — OXYCODONE HCL 5 MG PO TABS: 5.0000 mg | ORAL_TABLET | Freq: Three times a day (TID) | ORAL | Status: AC | PRN

## 2015-04-23 NOTE — ED Notes (Signed)
NP Cari B at bedside

## 2015-04-23 NOTE — ED Notes (Addendum)
Patient sitting on couch this morning and reached to get something off table and felt back pop and has had lower back since then.  Patient took oxycodone this morning for pain control at home.

## 2015-04-23 NOTE — Discharge Instructions (Signed)

## 2015-04-23 NOTE — ED Provider Notes (Signed)
[facility] Emergency Department Provider Note  ____________________________________________  Time seen: Approximately 3:57 PM  I have reviewed the triage vital signs and the nursing notes.   HISTORY  Chief Complaint Back Pain    HPI Chad Huffman is a 69 y.o. male who presents to the emergency department for evaluation of lower back pain. He states that he turned to get something off of the table and felt his back pop and then fell to the ground. He states "I think my vertebrae rubbed together." He describes this feeling as a "crunchy" feeling. He has a history of chronic back pain, however feels that this is different than his pain today. He took oxycodone with moderate pain relief, but is here "for an x-ray."   Past Medical History  Diagnosis Date  . Antalgic gait   . Hypertension   . Lumbago   . COPD (chronic obstructive pulmonary disease) (HCC)   . Complication of anesthesia     pt has had cervical fusion-limited neck flextion  . Depression   . DJD (degenerative joint disease)     Right shoulder  . Carotid artery occlusion   . Peripheral neuropathy (HCC)   . Chronic neck and back pain     Patient Active Problem List   Diagnosis Date Noted  . Cervical vertebral fusion   . History of lumbar laminectomy   . Chronic respiratory acidosis   . Depression   . Chronic neck and back pain   . Acute on chronic respiratory failure with hypercapnia (HCC)   . Acute on chronic respiratory failure with hypoxia (HCC)   . COPD exacerbation (HCC)   . HCAP (healthcare-associated pneumonia)   . Congestive dilated cardiomyopathy (HCC)   . Syncope and collapse   . Hyponatremia   . Other specified hypothyroidism   . Acute respiratory failure with hypoxia and hypercarbia (HCC) 08/04/2014  . S/P cervical spinal fusion 08/04/2014  . S/P lumbar laminectomy 08/04/2014  . Right shoulder pain 08/04/2014  . Arthritis of right hip 08/04/2014  . Essential hypertension  08/04/2014  . Hypothyroidism 08/04/2014  . Syncope 08/04/2014  . Fall 08/04/2014  . Low back pain 08/04/2014  . Pedal edema 08/04/2014  . Occlusion and stenosis of carotid artery without mention of cerebral infarction 12/10/2013  . Aftercare following surgery of the circulatory system, NEC 12/10/2013  . Pain of right upper extremity-Right Neck-Shoulder-Back 12/10/2013  . Abdominal aneurysm without mention of rupture 12/03/2011    Past Surgical History  Procedure Laterality Date  . Tonsillectomy    . Carpal tunnel release      bilateral CTS  . Abdominal aortic aneurysm repair  2009    Endovascular AAA repair  . Pilonidal cyst / sinus excision    . Spine surgery  09/15/11    Scar tissue removed- back  . Spine surgery  09/15/11    Scar tissue removed- back  . Neck surgery    . Rotary cuff    . Hammer toe surgery  12/10/2011    Procedure: HAMMER TOE CORRECTION;  Surgeon: Ernestene Kiel, DPM;  Location: [facility];  Service: Podiatry;  Laterality: Right;  Right hammertoe repair second right with 2.5x42 orthopro screw   . Metatarsal osteotomy  12/10/2011    Procedure: METATARSAL OSTEOTOMY;  Surgeon: Ernestene Kiel, DPM;  Location: [facility];  Service: Podiatry;  Laterality: Right;  Right Keller arthroplasty with size 5 silicone implant right great toe; second metatarsal osteotomy with 2.0x14 screw  .  Abdominal aortic aneurysm repair    . Peripheral vascular catheterization N/A 02/11/2015    Procedure: Carotid Angiography;  Surgeon: Nada Libman, MD;  Location: [facility] INVASIVE CV LAB;  Service: Cardiovascular;  Laterality: N/A;    Current Outpatient Rx  Name  Route  Sig  Dispense  Refill  . ALPRAZolam (XANAX) 0.5 MG tablet   Oral   Take 0.25-0.5 mg by mouth at bedtime.          Marland Kitchen aspirin EC 81 MG tablet   Oral   Take 81 mg by mouth daily.         . baclofen (LIORESAL) 20 MG tablet   Oral   Take 20-30 mg by mouth every 3 (three) hours as needed for muscle  spasms.         Marland Kitchen desonide (DESOWEN) 0.05 % cream   Topical   Apply 0.05 % topically 2 (two) times daily as needed (irritation).          . furosemide (LASIX) 40 MG tablet   Oral   Take 1 tablet (40 mg total) by mouth 2 (two) times daily. Patient taking differently: Take 40 mg by mouth daily.    60 tablet   0   . Ipratropium-Albuterol (COMBIVENT RESPIMAT) 20-100 MCG/ACT AERS respimat   Inhalation   Inhale 1 puff into the lungs every 6 (six) hours. Patient not taking: Reported on 04/16/2015   1 Inhaler   0     For use when you are away from home and can't use  ...   . ipratropium-albuterol (DUONEB) 0.5-2.5 (3) MG/3ML SOLN   Nebulization   Take 3 mLs by nebulization every 6 (six) hours. Patient not taking: Reported on 04/16/2015   360 mL   0   . ketoconazole (NIZORAL) 2 % shampoo   Topical   Apply 1 application topically daily as needed for irritation.          Marland Kitchen KRILL OIL PO   Oral   Take 1 capsule by mouth daily.          Marland Kitchen levothyroxine (SYNTHROID, LEVOTHROID) 125 MCG tablet   Oral   Take 1 tablet (125 mcg total) by mouth daily before breakfast.   30 tablet   0   . magnesium hydroxide (MILK OF MAGNESIA) 400 MG/5ML suspension   Oral   Take 30 mLs by mouth daily as needed for mild constipation or moderate constipation.          . Multiple Vitamin (MULTIVITAMIN) tablet   Oral   Take 1 tablet by mouth daily.         Marland Kitchen omeprazole (PRILOSEC) 20 MG capsule   Oral   Take 20 mg by mouth daily.         Marland Kitchen oxyCODONE (OXY IR/ROXICODONE) 5 MG immediate release tablet   Oral   Take 5 mg by mouth every 4 (four) hours as needed for severe pain.         . polyethylene glycol (MIRALAX / GLYCOLAX) packet   Oral   Take 17 g by mouth daily.          . sertraline (ZOLOFT) 100 MG tablet   Oral   Take 100 mg by mouth daily.         . SYMBICORT 160-4.5 MCG/ACT inhaler   Inhalation   Inhale 2 puffs into the lungs 2 (two) times daily.             Allergies Ampicillin; Brimonidine; Cyclobenzaprine; Tizanidine;  and Gabapentin  Family History  Problem Relation Age of Onset  . Cancer Mother     Stomach  . Hypertension Brother   . Heart attack Father     Social History Social History  Substance Use Topics  . Smoking status: Former Smoker    Quit date: 08/30/2006  . Smokeless tobacco: Never Used  . Alcohol Use: 1.2 oz/week    2 Cans of beer per week     Comment: occasional use    Review of Systems Constitutional: No recent illness. Eyes: No visual changes. ENT: No sore throat. Cardiovascular: Denies chest pain or palpitations. Respiratory: Denies shortness of breath. Gastrointestinal: No abdominal pain.  Genitourinary: Negative for dysuria. Musculoskeletal: Pain in lower back with horizontal radiation to both sides Skin: Negative for rash. Neurological: Negative for headaches, focal weakness or numbness. 10-point ROS otherwise negative.  ____________________________________________   PHYSICAL EXAM:  VITAL SIGNS: ED Triage Vitals  Enc Vitals Group     BP 04/23/15 1224 164/86 mmHg     Pulse --      Resp 04/23/15 1224 15     Temp 04/23/15 1224 97.9 F (36.6 C)     Temp Source 04/23/15 1224 Oral     SpO2 04/23/15 1224 95 %     Weight 04/23/15 1224 233 lb (105.688 kg)     Height 04/23/15 1224 5' 10.5" (1.791 m)     Head Cir --      Peak Flow --      Pain Score 04/23/15 1231 5     Pain Loc --      Pain Edu? --      Excl. in GC? --     Constitutional: Alert and oriented. Well appearing and in no acute distress. Eyes: Conjunctivae are normal. EOMI. Head: Atraumatic. Nose: No congestion/rhinnorhea. Neck: No stridor.  Respiratory: Normal respiratory effort.   Musculoskeletal: Midline tenderness of the lower lumbar area. Full range of motion of hips bilaterally. Neurologic:  Normal speech and language. No gross focal neurologic deficits are appreciated. Speech is normal. No gait instability. Skin:   Skin is warm, dry and intact. Atraumatic. Psychiatric: Mood and affect are normal. Speech and behavior are normal.  ____________________________________________   LABS (all labs ordered are listed, but only abnormal results are displayed)  Labs Reviewed - No data to display ____________________________________________  RADIOLOGY  LS-spine film negative for acute bony abnormality. ____________________________________________   PROCEDURES  Procedure(s) performed:    ____________________________________________   INITIAL IMPRESSION / ASSESSMENT AND PLAN / ED COURSE  Pertinent labs & imaging results that were available during my care of the patient were reviewed by me and considered in my medical decision making (see chart for details).  Patient was instructed to call his orthopedist to schedule an appointment for further evaluation of his back pain. He was instructed to take the oxycodone as prescribed and as needed. ER return precautions were discussed. ____________________________________________   FINAL CLINICAL IMPRESSION(S) / ED DIAGNOSES  Final diagnoses:  Acute lumbar back pain       Chinita PesterCari B Babetta Paterson, FNP 04/23/15 1825  Myrna Blazeravid Matthew Schaevitz, MD 04/23/15 [phone]

## 2015-06-24 ENCOUNTER — Emergency Department
Admission: EM | Admit: 2015-06-24 | Discharge: 2015-06-24 | Disposition: A | Discharge status: Home / Self Care | Payer: Medicare Other | Attending: Emergency Medicine | Admitting: Emergency Medicine

## 2015-06-24 DIAGNOSIS — I10 Essential (primary) hypertension: Secondary | ICD-10-CM | POA: Diagnosis not present

## 2015-06-24 DIAGNOSIS — Z7952 Long term (current) use of systemic steroids: Secondary | ICD-10-CM | POA: Diagnosis not present

## 2015-06-24 DIAGNOSIS — Y9301 Activity, walking, marching and hiking: Secondary | ICD-10-CM | POA: Diagnosis not present

## 2015-06-24 DIAGNOSIS — S8992XA Unspecified injury of left lower leg, initial encounter: Secondary | ICD-10-CM | POA: Diagnosis present

## 2015-06-24 DIAGNOSIS — Y998 Other external cause status: Secondary | ICD-10-CM | POA: Diagnosis not present

## 2015-06-24 DIAGNOSIS — S80811A Abrasion, right lower leg, initial encounter: Secondary | ICD-10-CM | POA: Diagnosis not present

## 2015-06-24 DIAGNOSIS — S80812A Abrasion, left lower leg, initial encounter: Secondary | ICD-10-CM | POA: Diagnosis not present

## 2015-06-24 DIAGNOSIS — Z7982 Long term (current) use of aspirin: Secondary | ICD-10-CM | POA: Insufficient documentation

## 2015-06-24 DIAGNOSIS — Y9289 Other specified places as the place of occurrence of the external cause: Secondary | ICD-10-CM | POA: Diagnosis not present

## 2015-06-24 DIAGNOSIS — N3000 Acute cystitis without hematuria: Secondary | ICD-10-CM

## 2015-06-24 DIAGNOSIS — Z79899 Other long term (current) drug therapy: Secondary | ICD-10-CM | POA: Insufficient documentation

## 2015-06-24 DIAGNOSIS — T07XXXA Unspecified multiple injuries, initial encounter: Secondary | ICD-10-CM

## 2015-06-24 DIAGNOSIS — R443 Hallucinations, unspecified: Secondary | ICD-10-CM | POA: Diagnosis not present

## 2015-06-24 DIAGNOSIS — W1839XA Other fall on same level, initial encounter: Secondary | ICD-10-CM | POA: Diagnosis not present

## 2015-06-24 DIAGNOSIS — Z87891 Personal history of nicotine dependence: Secondary | ICD-10-CM | POA: Insufficient documentation

## 2015-06-24 DIAGNOSIS — F513 Sleepwalking [somnambulism]: Secondary | ICD-10-CM | POA: Diagnosis not present

## 2015-06-24 LAB — URINALYSIS COMPLETE WITH MICROSCOPIC (ARMC ONLY)
Bilirubin Urine: NEGATIVE
GLUCOSE, UA: NEGATIVE mg/dL
Hgb urine dipstick: NEGATIVE
Ketones, ur: NEGATIVE mg/dL
NITRITE: NEGATIVE
PROTEIN: NEGATIVE mg/dL
Specific Gravity, Urine: 1.008 (ref 1.005–1.030)
pH: 5 (ref 5.0–8.0)

## 2015-06-24 LAB — CBC WITH DIFFERENTIAL/PLATELET
BASOS ABS: 0.1 10*3/uL (ref 0–0.1)
Basophils Relative: 1 %
EOS PCT: 1 %
Eosinophils Absolute: 0.1 10*3/uL (ref 0–0.7)
HCT: 38.6 % — ABNORMAL LOW (ref 40.0–52.0)
HEMOGLOBIN: 12.9 g/dL — AB (ref 13.0–18.0)
LYMPHS ABS: 0.8 10*3/uL — AB (ref 1.0–3.6)
LYMPHS PCT: 7 %
MCH: 29.9 pg (ref 26.0–34.0)
MCHC: 33.5 g/dL (ref 32.0–36.0)
MCV: 89.4 fL (ref 80.0–100.0)
Monocytes Absolute: 1.2 10*3/uL — ABNORMAL HIGH (ref 0.2–1.0)
Monocytes Relative: 11 %
NEUTROS PCT: 80 %
Neutro Abs: 9.1 10*3/uL — ABNORMAL HIGH (ref 1.4–6.5)
PLATELETS: 152 10*3/uL (ref 150–440)
RBC: 4.31 MIL/uL — AB (ref 4.40–5.90)
RDW: 16.9 % — ABNORMAL HIGH (ref 11.5–14.5)
WBC: 11.2 10*3/uL — AB (ref 3.8–10.6)

## 2015-06-24 LAB — COMPREHENSIVE METABOLIC PANEL
ALK PHOS: 66 U/L (ref 38–126)
ALT: 20 U/L (ref 17–63)
AST: 25 U/L (ref 15–41)
Albumin: 4.3 g/dL (ref 3.5–5.0)
Anion gap: 10 (ref 5–15)
BUN: 33 mg/dL — AB (ref 6–20)
CALCIUM: 9 mg/dL (ref 8.9–10.3)
CHLORIDE: 92 mmol/L — AB (ref 101–111)
CO2: 33 mmol/L — AB (ref 22–32)
CREATININE: 1.7 mg/dL — AB (ref 0.61–1.24)
GFR, EST AFRICAN AMERICAN: 46 mL/min — AB (ref >60)
GFR, EST NON AFRICAN AMERICAN: 40 mL/min — AB (ref >60)
Glucose, Bld: 122 mg/dL — ABNORMAL HIGH (ref 65–99)
Potassium: 3.9 mmol/L (ref 3.5–5.1)
SODIUM: 135 mmol/L (ref 135–145)
Total Bilirubin: 1.8 mg/dL — ABNORMAL HIGH (ref 0.3–1.2)
Total Protein: 7.2 g/dL (ref 6.5–8.1)

## 2015-06-24 MED ORDER — CIPROFLOXACIN HCL 500 MG PO TABS: 500.0000 mg | ORAL_TABLET | Freq: Two times a day (BID) | ORAL | Status: AC

## 2015-06-24 MED ORDER — CIPROFLOXACIN HCL 500 MG PO TABS
500.0000 mg | ORAL_TABLET | Freq: Once | ORAL | Status: AC
  Administered 2015-06-24: 500 mg via ORAL
  Filled 2015-06-24: qty 1

## 2015-06-24 NOTE — Discharge Instructions (Signed)
You have a urinary tract infection with many white blood cells in your urine. This may be causing her to have some of the hallucinatory symptoms see been having. Take ciprofloxacin for this infection. Keep the abrasions clean. Wash them twice a day and avoid scab build up. Follow-up with your regular doctor in 3-7 days. Return to emergency department if the abraded area becomes red or appears infected, if you have fevers, or if you have any other urgent concerns.  Urinary Tract Infection Urinary tract infections (UTIs) can develop anywhere along your urinary tract. Your urinary tract is your body's drainage system for removing wastes and extra water. Your urinary tract includes two kidneys, two ureters, a bladder, and a urethra. Your kidneys are a pair of bean-shaped organs. Each kidney is about the size of your fist. They are located below your ribs, one on each side of your spine. CAUSES Infections are caused by microbes, which are microscopic organisms, including fungi, viruses, and bacteria. These organisms are so small that they can only be seen through a microscope. Bacteria are the microbes that most commonly cause UTIs. SYMPTOMS  Symptoms of UTIs may vary by age and gender of the patient and by the location of the infection. Symptoms in young women typically include a frequent and intense urge to urinate and a painful, burning feeling in the bladder or urethra during urination. Older women and men are more likely to be tired, shaky, and weak and have muscle aches and abdominal pain. A fever may mean the infection is in your kidneys. Other symptoms of a kidney infection include pain in your back or sides below the ribs, nausea, and vomiting. DIAGNOSIS To diagnose a UTI, your caregiver will ask you about your symptoms. Your caregiver will also ask you to provide a urine sample. The urine sample will be tested for bacteria and white blood cells. White blood cells are made by your body to help fight  infection. TREATMENT  Typically, UTIs can be treated with medication. Because most UTIs are caused by a bacterial infection, they usually can be treated with the use of antibiotics. The choice of antibiotic and length of treatment depend on your symptoms and the type of bacteria causing your infection. HOME CARE INSTRUCTIONS  If you were prescribed antibiotics, take them exactly as your caregiver instructs you. Finish the medication even if you feel better after you have only taken some of the medication.  Drink enough water and fluids to keep your urine clear or pale yellow.  Avoid caffeine, tea, and carbonated beverages. They tend to irritate your bladder.  Empty your bladder often. Avoid holding urine for long periods of time.  Empty your bladder before and after sexual intercourse.  After a bowel movement, women should cleanse from front to back. Use each tissue only once. SEEK MEDICAL CARE IF:   You have back pain.  You develop a fever.  Your symptoms do not begin to resolve within 3 days. SEEK IMMEDIATE MEDICAL CARE IF:   You have severe back pain or lower abdominal pain.  You develop chills.  You have nausea or vomiting.  You have continued burning or discomfort with urination. MAKE SURE YOU:   Understand these instructions.  Will watch your condition.  Will get help right away if you are not doing well or get worse.   This information is not intended to replace advice given to you by your health care provider. Make sure you discuss any questions you have with your  health care provider.   Document Released: 03/17/2005 Document Revised: 02/26/2015 Document Reviewed: 07/16/2011 Elsevier Interactive Patient Education [employer].

## 2015-06-24 NOTE — ED Notes (Signed)
Assisted pt to bathroom and back to bed for UA specimen. Pt tolerated ambulation well. Pt normally uses walker when ambulating.

## 2015-06-24 NOTE — ED Notes (Signed)
Pt bib by ems due to fall earlier today. Pt has superficial abrasions on his bilateral lower extremities. Pt denies pain and states " my family made me come, I  Do not want to be here so I am not staying here long"

## 2015-06-24 NOTE — ED Provider Notes (Signed)
[name] Emergency Department Provider Note  ____________________________________________  Time seen: 2020  I have reviewed the triage vital signs and the nursing notes.  History by:  Patient and by his granddaughter  HISTORY  Chief Complaint Fall  abrasions to lower legs    HPI Chad Huffman is a 70 y.o. male with a history of some confusion. He is cared for with visits from his granddaughter and another family member. He was brought dinner last night at 6:30 area today, when the family returned to give him dinner and check on him, they found blood all over his lower legs. The patient reports that he had been sleepwalking the night before and had fallen while outside. He reports he did not hit his head or hurt his neck (offering this information before I asked). He is unable to tell me what time this happened. He reports he does not recall the fall but that he knows he was outside. Details of this event are unclear.  The granddaughter present with the patient, reports that he has also told them that he is hallucinating, seeing people who have died previously. The patient confirms this. Apparently, he reports he has been having these hallucinations since beginning a medicine that is supposed to help him sleep at night. He is unable tell me the name of this medication.  His granddaughter and he also report that he has had a cold and has been taking Mucinex, with decongestion, extra strength.  The patient is alert and communicative and adamant that he has no injury other than the superficial abrasions on his bilateral lower legs. He reports he saw his regular physician, Dr. Terance Hart, recently.    Past Medical History  Diagnosis Date  . Antalgic gait   . Hypertension   . Lumbago   . COPD (chronic obstructive pulmonary disease) (HCC)   . Complication of anesthesia     pt has had cervical fusion-limited neck flextion  . Depression   . DJD (degenerative  joint disease)     Right shoulder  . Carotid artery occlusion   . Peripheral neuropathy (HCC)   . Chronic neck and back pain     Patient Active Problem List   Diagnosis Date Noted  . Cervical vertebral fusion   . History of lumbar laminectomy   . Chronic respiratory acidosis   . Depression   . Chronic neck and back pain   . Acute on chronic respiratory failure with hypercapnia (HCC)   . Acute on chronic respiratory failure with hypoxia (HCC)   . COPD exacerbation (HCC)   . HCAP (healthcare-associated pneumonia)   . Congestive dilated cardiomyopathy (HCC)   . Syncope and collapse   . Hyponatremia   . Other specified hypothyroidism   . Acute respiratory failure with hypoxia and hypercarbia (HCC) 08/04/2014  . S/P cervical spinal fusion 08/04/2014  . S/P lumbar laminectomy 08/04/2014  . Right shoulder pain 08/04/2014  . Arthritis of right hip 08/04/2014  . Essential hypertension 08/04/2014  . Hypothyroidism 08/04/2014  . Syncope 08/04/2014  . Fall 08/04/2014  . Low back pain 08/04/2014  . Pedal edema 08/04/2014  . Occlusion and stenosis of carotid artery without mention of cerebral infarction 12/10/2013  . Aftercare following surgery of the circulatory system, NEC 12/10/2013  . Pain of right upper extremity-Right Neck-Shoulder-Back 12/10/2013  . Abdominal aneurysm without mention of rupture 12/03/2011    Past Surgical History  Procedure Laterality Date  . Tonsillectomy    . Carpal tunnel release  bilateral CTS  . Abdominal aortic aneurysm repair  2009    Endovascular AAA repair  . Pilonidal cyst / sinus excision    . Spine surgery  09/15/11    Scar tissue removed- back  . Spine surgery  09/15/11    Scar tissue removed- back  . Neck surgery    . Rotary cuff    . Hammer toe surgery  12/10/2011    Procedure: HAMMER TOE CORRECTION;  Surgeon: Ernestene Kiel, DPM;  Location: [facility];  Service: Podiatry;  Laterality: Right;  Right hammertoe repair second  right with 2.5x42 orthopro screw   . Metatarsal osteotomy  12/10/2011    Procedure: METATARSAL OSTEOTOMY;  Surgeon: Ernestene Kiel, DPM;  Location: [facility];  Service: Podiatry;  Laterality: Right;  Right Keller arthroplasty with size 5 silicone implant right great toe; second metatarsal osteotomy with 2.0x14 screw  . Abdominal aortic aneurysm repair    . Peripheral vascular catheterization N/A 02/11/2015    Procedure: Carotid Angiography;  Surgeon: Nada Libman, MD;  Location: [facility] INVASIVE CV LAB;  Service: Cardiovascular;  Laterality: N/A;    Current Outpatient Rx  Name  Route  Sig  Dispense  Refill  . ALPRAZolam (XANAX) 0.5 MG tablet   Oral   Take 0.25-0.5 mg by mouth at bedtime.          Marland Kitchen aspirin EC 81 MG tablet   Oral   Take 81 mg by mouth daily.         . baclofen (LIORESAL) 20 MG tablet   Oral   Take 20-30 mg by mouth every 3 (three) hours as needed for muscle spasms.         . ciprofloxacin (CIPRO) 500 MG tablet   Oral   Take 1 tablet (500 mg total) by mouth 2 (two) times daily.   14 tablet   0   . desonide (DESOWEN) 0.05 % cream   Topical   Apply 0.05 % topically 2 (two) times daily as needed (irritation).          . furosemide (LASIX) 40 MG tablet   Oral   Take 1 tablet (40 mg total) by mouth 2 (two) times daily. Patient taking differently: Take 40 mg by mouth daily.    60 tablet   0   . Ipratropium-Albuterol (COMBIVENT RESPIMAT) 20-100 MCG/ACT AERS respimat   Inhalation   Inhale 1 puff into the lungs every 6 (six) hours. Patient not taking: Reported on 04/16/2015   1 Inhaler   0     For use when you are away from home and can't use  ...   . ipratropium-albuterol (DUONEB) 0.5-2.5 (3) MG/3ML SOLN   Nebulization   Take 3 mLs by nebulization every 6 (six) hours. Patient not taking: Reported on 04/16/2015   360 mL   0   . ketoconazole (NIZORAL) 2 % shampoo   Topical   Apply 1 application topically daily as needed for irritation.           Marland Kitchen KRILL OIL PO   Oral   Take 1 capsule by mouth daily.          Marland Kitchen levothyroxine (SYNTHROID, LEVOTHROID) 125 MCG tablet   Oral   Take 1 tablet (125 mcg total) by mouth daily before breakfast.   30 tablet   0   . magnesium hydroxide (MILK OF MAGNESIA) 400 MG/5ML suspension   Oral   Take 30 mLs by mouth daily as needed for mild  constipation or moderate constipation.          . Multiple Vitamin (MULTIVITAMIN) tablet   Oral   Take 1 tablet by mouth daily.         Marland Kitchen. omeprazole (PRILOSEC) 20 MG capsule   Oral   Take 20 mg by mouth daily.         Marland Kitchen. oxyCODONE (ROXICODONE) 5 MG immediate release tablet   Oral   Take 1 tablet (5 mg total) by mouth every 8 (eight) hours as needed.   20 tablet   0   . polyethylene glycol (MIRALAX / GLYCOLAX) packet   Oral   Take 17 g by mouth daily.          . sertraline (ZOLOFT) 100 MG tablet   Oral   Take 100 mg by mouth daily.         . SYMBICORT 160-4.5 MCG/ACT inhaler   Inhalation   Inhale 2 puffs into the lungs 2 (two) times daily.            Allergies Ampicillin; Brimonidine; Cyclobenzaprine; Tizanidine; and Gabapentin  Family History  Problem Relation Age of Onset  . Cancer Mother     Stomach  . Hypertension Brother   . Heart attack Father     Social History Social History  Substance Use Topics  . Smoking status: Former Smoker    Quit date: 08/30/2006  . Smokeless tobacco: Never Used  . Alcohol Use: 1.2 oz/week    2 Cans of beer per week     Comment: occasional use    Review of Systems  Constitutional: Negative for fever/chills. ENT: Negative for congestion. Cardiovascular: Negative for chest pain. Respiratory: Negative for cough. Gastrointestinal: Negative for abdominal pain, vomiting and diarrhea. Genitourinary: Negative for dysuria. Musculoskeletal: No back pain. Skin: Significant abrasions over the anterior lower legs bilaterally. No deeper lacerations. See history of present  illness Neurological: Negative for headache or focal weakness Psychiatric: Patient reports he's had some hallucinations. See history of present illness  10-point ROS otherwise negative.  ____________________________________________   PHYSICAL EXAM:  VITAL SIGNS: ED Triage Vitals  Enc Vitals Group     BP 06/24/15 1923 138/72 mmHg     Pulse Rate 06/24/15 1923 80     Resp 06/24/15 1923 18     Temp 06/24/15 1923 97.8 F (36.6 C)     Temp src --      SpO2 06/24/15 1923 92 %     Weight --      Height --      Head Cir --      Peak Flow --      Pain Score --      Pain Loc --      Pain Edu? --      Excl. in GC? --     Constitutional:  Alert and active. Well appearing and in no distress. ENT   Head: Normocephalic and atraumatic.   Nose: No congestion/rhinnorhea.       Mouth: No erythema, no swelling   Cardiovascular: Normal rate, regular rhythm, no murmur noted Respiratory:  Normal respiratory effort, no tachypnea.    Breath sounds are clear and equal bilaterally.  Gastrointestinal: Soft, no distention. Nontender Back: No muscle spasm, no tenderness, no CVA tenderness. Musculoskeletal: No deformity noted. Nontender with normal range of motion in all extremities.  No noted edema. Skin abrasion as noted below in skin assessment. Neurologic:  Communicative. Normal appearing spontaneous movement in all 4 extremities. No gross  focal neurologic deficits are appreciated.  Skin:  Abrasion over the anterior surface of both lower legs. There are no deeper lacerations require closure. Psychiatric: Alert and communicative. Cooperative. Confirms some hallucinations, but seems to have good recognition and cognition of this problem..  ____________________________________________    LABS (pertinent positives/negatives)  Labs Reviewed  CBC WITH DIFFERENTIAL/PLATELET - Abnormal; Notable for the following:    WBC 11.2 (*)    RBC 4.31 (*)    Hemoglobin 12.9 (*)    HCT 38.6 (*)    RDW  16.9 (*)    Neutro Abs 9.1 (*)    Lymphs Abs 0.8 (*)    Monocytes Absolute 1.2 (*)    All other components within normal limits  COMPREHENSIVE METABOLIC PANEL - Abnormal; Notable for the following:    Chloride 92 (*)    CO2 33 (*)    Glucose, Bld 122 (*)    BUN 33 (*)    Creatinine, Ser 1.70 (*)    Total Bilirubin 1.8 (*)    GFR calc non Af Amer 40 (*)    GFR calc Af Amer 46 (*)    All other components within normal limits  URINALYSIS COMPLETEWITH MICROSCOPIC ([facility] ONLY) - Abnormal; Notable for the following:    Color, Urine YELLOW (*)    APPearance HAZY (*)    Leukocytes, UA 3+ (*)    Bacteria, UA MANY (*)    Squamous Epithelial / LPF 0-5 (*)    All other components within normal limits     ____________________________________________  ____________________________________________   INITIAL IMPRESSION / ASSESSMENT AND PLAN / ED COURSE  Pertinent labs & imaging results that were available during my care of the patient were reviewed by me and considered in my medical decision making (see chart for details).  Overall well-appearing 70 year old male. He has seen immune abrasions that the granddaughter cleaned up at home and require no further care at this time. He has had some hallucinatory side effects, likely from medications. He has stopped the sleep medication and I advised him to discontinue the Mucinex with decongestant.  We will assess his blood work for electrolytes and blood count and a urinalysis.  ----------------------------------------- 10:38 PM on 06/24/2015 -----------------------------------------  Patient has a urinary tract infection with white blood cells too numerous to count. He also has some mild renal dysfunction with a BUN of 33 and creatinine of 1.7. His white blood cell count of 11.2 with a hemoglobin of 12.9.  We will begin antibiotics for the urinary tract infection. He appears stable and has good care at home. We'll discharge him home to follow-up  with his primary physician.  ____________________________________________   FINAL CLINICAL IMPRESSION(S) / ED DIAGNOSES  Final diagnoses:  Acute cystitis without hematuria  Abrasions of multiple sites      Darien Ramus, MD 06/24/15 2247

## 2015-07-22 ENCOUNTER — Emergency Department: Payer: Medicare Other

## 2015-07-22 ENCOUNTER — Inpatient Hospital Stay
Admission: EM | Admit: 2015-07-22 | Discharge: 2015-07-25 | DRG: 190 | Disposition: A | Discharge status: Skilled Nursing Facility | Payer: Medicare Other | Attending: Internal Medicine | Admitting: Internal Medicine

## 2015-07-22 DIAGNOSIS — I6529 Occlusion and stenosis of unspecified carotid artery: Secondary | ICD-10-CM | POA: Diagnosis present

## 2015-07-22 DIAGNOSIS — E039 Hypothyroidism, unspecified: Secondary | ICD-10-CM | POA: Diagnosis present

## 2015-07-22 DIAGNOSIS — J9601 Acute respiratory failure with hypoxia: Secondary | ICD-10-CM | POA: Diagnosis not present

## 2015-07-22 DIAGNOSIS — J440 Chronic obstructive pulmonary disease with acute lower respiratory infection: Secondary | ICD-10-CM | POA: Diagnosis present

## 2015-07-22 DIAGNOSIS — Z7982 Long term (current) use of aspirin: Secondary | ICD-10-CM | POA: Diagnosis not present

## 2015-07-22 DIAGNOSIS — M542 Cervicalgia: Secondary | ICD-10-CM | POA: Diagnosis present

## 2015-07-22 DIAGNOSIS — I440 Atrioventricular block, first degree: Secondary | ICD-10-CM | POA: Diagnosis present

## 2015-07-22 DIAGNOSIS — R0602 Shortness of breath: Secondary | ICD-10-CM

## 2015-07-22 DIAGNOSIS — G9341 Metabolic encephalopathy: Secondary | ICD-10-CM | POA: Diagnosis present

## 2015-07-22 DIAGNOSIS — Z981 Arthrodesis status: Secondary | ICD-10-CM

## 2015-07-22 DIAGNOSIS — Z87891 Personal history of nicotine dependence: Secondary | ICD-10-CM | POA: Diagnosis not present

## 2015-07-22 DIAGNOSIS — M19011 Primary osteoarthritis, right shoulder: Secondary | ICD-10-CM | POA: Diagnosis present

## 2015-07-22 DIAGNOSIS — J209 Acute bronchitis, unspecified: Secondary | ICD-10-CM | POA: Diagnosis present

## 2015-07-22 DIAGNOSIS — R001 Bradycardia, unspecified: Secondary | ICD-10-CM | POA: Diagnosis present

## 2015-07-22 DIAGNOSIS — J9622 Acute and chronic respiratory failure with hypercapnia: Secondary | ICD-10-CM | POA: Diagnosis present

## 2015-07-22 DIAGNOSIS — J9691 Respiratory failure, unspecified with hypoxia: Secondary | ICD-10-CM | POA: Diagnosis present

## 2015-07-22 DIAGNOSIS — J9692 Respiratory failure, unspecified with hypercapnia: Secondary | ICD-10-CM | POA: Diagnosis present

## 2015-07-22 DIAGNOSIS — G8929 Other chronic pain: Secondary | ICD-10-CM | POA: Diagnosis present

## 2015-07-22 DIAGNOSIS — Z880 Allergy status to penicillin: Secondary | ICD-10-CM

## 2015-07-22 DIAGNOSIS — J9621 Acute and chronic respiratory failure with hypoxia: Secondary | ICD-10-CM | POA: Diagnosis present

## 2015-07-22 DIAGNOSIS — Z9981 Dependence on supplemental oxygen: Secondary | ICD-10-CM

## 2015-07-22 DIAGNOSIS — N179 Acute kidney failure, unspecified: Secondary | ICD-10-CM | POA: Diagnosis present

## 2015-07-22 DIAGNOSIS — J189 Pneumonia, unspecified organism: Secondary | ICD-10-CM | POA: Diagnosis present

## 2015-07-22 DIAGNOSIS — J441 Chronic obstructive pulmonary disease with (acute) exacerbation: Secondary | ICD-10-CM | POA: Diagnosis not present

## 2015-07-22 DIAGNOSIS — F329 Major depressive disorder, single episode, unspecified: Secondary | ICD-10-CM | POA: Diagnosis present

## 2015-07-22 DIAGNOSIS — G629 Polyneuropathy, unspecified: Secondary | ICD-10-CM | POA: Diagnosis present

## 2015-07-22 DIAGNOSIS — Z888 Allergy status to other drugs, medicaments and biological substances status: Secondary | ICD-10-CM

## 2015-07-22 DIAGNOSIS — M1990 Unspecified osteoarthritis, unspecified site: Secondary | ICD-10-CM | POA: Diagnosis present

## 2015-07-22 DIAGNOSIS — M545 Low back pain: Secondary | ICD-10-CM | POA: Diagnosis present

## 2015-07-22 DIAGNOSIS — Z79899 Other long term (current) drug therapy: Secondary | ICD-10-CM | POA: Diagnosis not present

## 2015-07-22 DIAGNOSIS — Z79891 Long term (current) use of opiate analgesic: Secondary | ICD-10-CM | POA: Diagnosis not present

## 2015-07-22 DIAGNOSIS — R451 Restlessness and agitation: Secondary | ICD-10-CM | POA: Diagnosis present

## 2015-07-22 DIAGNOSIS — I10 Essential (primary) hypertension: Secondary | ICD-10-CM | POA: Diagnosis present

## 2015-07-22 LAB — CBC WITH DIFFERENTIAL/PLATELET
BASOS PCT: 1 %
Basophils Absolute: 0.1 10*3/uL (ref 0–0.1)
EOS ABS: 0 10*3/uL (ref 0–0.7)
EOS PCT: 0 %
HCT: 37.5 % — ABNORMAL LOW (ref 40.0–52.0)
Hemoglobin: 12.1 g/dL — ABNORMAL LOW (ref 13.0–18.0)
LYMPHS ABS: 0.5 10*3/uL — AB (ref 1.0–3.6)
Lymphocytes Relative: 4 %
MCH: 29.5 pg (ref 26.0–34.0)
MCHC: 32.3 g/dL (ref 32.0–36.0)
MCV: 91.2 fL (ref 80.0–100.0)
MONOS PCT: 11 %
Monocytes Absolute: 1.4 10*3/uL — ABNORMAL HIGH (ref 0.2–1.0)
NEUTROS PCT: 84 %
Neutro Abs: 11.2 10*3/uL — ABNORMAL HIGH (ref 1.4–6.5)
PLATELETS: 142 10*3/uL — AB (ref 150–440)
RBC: 4.11 MIL/uL — AB (ref 4.40–5.90)
RDW: 18.2 % — ABNORMAL HIGH (ref 11.5–14.5)
WBC: 13.1 10*3/uL — AB (ref 3.8–10.6)

## 2015-07-22 LAB — MRSA PCR SCREENING: MRSA by PCR: NEGATIVE

## 2015-07-22 LAB — BRAIN NATRIURETIC PEPTIDE: B NATRIURETIC PEPTIDE 5: 161 pg/mL — AB (ref 0.0–100.0)

## 2015-07-22 LAB — BLOOD GAS, ARTERIAL
ACID-BASE EXCESS: 8.5 mmol/L — AB (ref 0.0–3.0)
ALLENS TEST (PASS/FAIL): POSITIVE — AB
ALLENS TEST (PASS/FAIL): POSITIVE — AB
Acid-Base Excess: 10.3 mmol/L — ABNORMAL HIGH (ref 0.0–3.0)
BICARBONATE: 35.8 meq/L — AB (ref 21.0–28.0)
BICARBONATE: 35.1 meq/L — AB (ref 21.0–28.0)
Delivery systems: POSITIVE
Delivery systems: POSITIVE
Expiratory PAP: 8
Expiratory PAP: 8
FIO2: 0.35
FIO2: 0.35
Inspiratory PAP: 16
Inspiratory PAP: 16
Mechanical Rate: 10
O2 SAT: 98.3 %
O2 SAT: 95.7 %
PATIENT TEMPERATURE: 37
PO2 ART: 113 mmHg — AB (ref 83.0–108.0)
PO2 ART: 73 mmHg — AB (ref 83.0–108.0)
Patient temperature: 37
pCO2 arterial: 62 mmHg — ABNORMAL HIGH (ref 32.0–48.0)
pCO2 arterial: 46 mmHg (ref 32.0–48.0)
pH, Arterial: 7.37 (ref 7.350–7.450)
pH, Arterial: 7.49 — ABNORMAL HIGH (ref 7.350–7.450)

## 2015-07-22 LAB — URINALYSIS COMPLETE WITH MICROSCOPIC (ARMC ONLY)
Bacteria, UA: NONE SEEN
GLUCOSE, UA: NEGATIVE mg/dL
Hgb urine dipstick: NEGATIVE
Ketones, ur: NEGATIVE mg/dL
LEUKOCYTES UA: NEGATIVE
NITRITE: NEGATIVE
PROTEIN: NEGATIVE mg/dL
SPECIFIC GRAVITY, URINE: 1.015 (ref 1.005–1.030)
Squamous Epithelial / LPF: NONE SEEN
pH: 5 (ref 5.0–8.0)

## 2015-07-22 LAB — LACTIC ACID, PLASMA
LACTIC ACID, VENOUS: 1.5 mmol/L (ref 0.5–2.0)
LACTIC ACID, VENOUS: 1.6 mmol/L (ref 0.5–2.0)

## 2015-07-22 LAB — TSH: TSH: 2.144 u[IU]/mL (ref 0.350–4.500)

## 2015-07-22 LAB — COMPREHENSIVE METABOLIC PANEL
ALBUMIN: 4.1 g/dL (ref 3.5–5.0)
ALT: 22 U/L (ref 17–63)
ANION GAP: 8 (ref 5–15)
AST: 42 U/L — ABNORMAL HIGH (ref 15–41)
Alkaline Phosphatase: 76 U/L (ref 38–126)
BILIRUBIN TOTAL: 2.1 mg/dL — AB (ref 0.3–1.2)
BUN: 44 mg/dL — ABNORMAL HIGH (ref 6–20)
CO2: 33 mmol/L — ABNORMAL HIGH (ref 22–32)
Calcium: 9.1 mg/dL (ref 8.9–10.3)
Chloride: 98 mmol/L — ABNORMAL LOW (ref 101–111)
Creatinine, Ser: 1.58 mg/dL — ABNORMAL HIGH (ref 0.61–1.24)
GFR, EST AFRICAN AMERICAN: 51 mL/min — AB (ref >60)
GFR, EST NON AFRICAN AMERICAN: 44 mL/min — AB (ref >60)
GLUCOSE: 102 mg/dL — AB (ref 65–99)
POTASSIUM: 4.3 mmol/L (ref 3.5–5.1)
Sodium: 139 mmol/L (ref 135–145)
TOTAL PROTEIN: 7.4 g/dL (ref 6.5–8.1)

## 2015-07-22 LAB — TROPONIN I: Troponin I: <0.03 ng/mL (ref <0.031)

## 2015-07-22 LAB — TYPE AND SCREEN
ABO/RH(D): A NEG
Antibody Screen: NEGATIVE

## 2015-07-22 LAB — PROTIME-INR
INR: 1.09
Prothrombin Time: 14.3 seconds (ref 11.4–15.0)

## 2015-07-22 LAB — APTT: APTT: 38 s — AB (ref 24–36)

## 2015-07-22 LAB — ETHANOL

## 2015-07-22 LAB — AMMONIA: Ammonia: 17 umol/L (ref 9–35)

## 2015-07-22 LAB — CK: CK TOTAL: 695 U/L — AB (ref 49–397)

## 2015-07-22 LAB — ABO/RH: ABO/RH(D): A NEG

## 2015-07-22 MED ORDER — SODIUM CHLORIDE 0.9% FLUSH
3.0000 mL | Freq: Two times a day (BID) | INTRAVENOUS | Status: DC
  Administered 2015-07-22 – 2015-07-25 (×5): 3 mL via INTRAVENOUS

## 2015-07-22 MED ORDER — MIRTAZAPINE 15 MG PO TABS
15.0000 mg | ORAL_TABLET | Freq: Every day | ORAL | Status: DC
  Administered 2015-07-23 – 2015-07-24 (×2): 15 mg via ORAL
  Filled 2015-07-22 (×2): qty 1

## 2015-07-22 MED ORDER — LORAZEPAM 2 MG/ML IJ SOLN
0.5000 mg | Freq: Once | INTRAMUSCULAR | Status: AC
  Administered 2015-07-23: 0.5 mg via INTRAVENOUS
  Filled 2015-07-22: qty 1

## 2015-07-22 MED ORDER — PANTOPRAZOLE SODIUM 40 MG PO TBEC: 40.0000 mg | DELAYED_RELEASE_TABLET | Freq: Every day | ORAL | Status: DC

## 2015-07-22 MED ORDER — LISINOPRIL 10 MG PO TABS
10.0000 mg | ORAL_TABLET | Freq: Every day | ORAL | Status: DC
Start: 2015-07-22 — End: 2015-07-26
  Administered 2015-07-23 – 2015-07-25 (×3): 10 mg via ORAL
  Filled 2015-07-22 (×3): qty 1

## 2015-07-22 MED ORDER — METHYLPREDNISOLONE SODIUM SUCC 125 MG IJ SOLR
60.0000 mg | Freq: Three times a day (TID) | INTRAMUSCULAR | Status: DC
  Administered 2015-07-22 – 2015-07-23 (×2): 60 mg via INTRAVENOUS
  Filled 2015-07-22 (×3): qty 2

## 2015-07-22 MED ORDER — SODIUM CHLORIDE 0.9 % IV SOLN
INTRAVENOUS | Status: DC
  Administered 2015-07-22 – 2015-07-25 (×6): via INTRAVENOUS

## 2015-07-22 MED ORDER — SERTRALINE HCL 50 MG PO TABS
100.0000 mg | ORAL_TABLET | Freq: Every day | ORAL | Status: DC
Start: 2015-07-22 — End: 2015-07-26
  Administered 2015-07-23 – 2015-07-25 (×3): 100 mg via ORAL
  Filled 2015-07-22 (×3): qty 2

## 2015-07-22 MED ORDER — HYDRALAZINE HCL 20 MG/ML IJ SOLN
10.0000 mg | Freq: Four times a day (QID) | INTRAMUSCULAR | Status: DC | PRN
  Administered 2015-07-25: 10 mg via INTRAVENOUS
  Filled 2015-07-22: qty 1

## 2015-07-22 MED ORDER — SENNOSIDES-DOCUSATE SODIUM 8.6-50 MG PO TABS: 1.0000 | ORAL_TABLET | Freq: Every evening | ORAL | Status: DC | PRN

## 2015-07-22 MED ORDER — ACETAMINOPHEN 325 MG PO TABS: 650.0000 mg | ORAL_TABLET | Freq: Four times a day (QID) | ORAL | Status: DC | PRN

## 2015-07-22 MED ORDER — LEVOFLOXACIN IN D5W 750 MG/150ML IV SOLN
750.0000 mg | INTRAVENOUS | Status: DC
  Administered 2015-07-23: 750 mg via INTRAVENOUS
  Filled 2015-07-22 (×2): qty 150

## 2015-07-22 MED ORDER — LEVOTHYROXINE SODIUM 125 MCG PO TABS
125.0000 ug | ORAL_TABLET | Freq: Every day | ORAL | Status: DC
  Administered 2015-07-23 – 2015-07-24 (×2): 125 ug via ORAL
  Filled 2015-07-22 (×2): qty 1

## 2015-07-22 MED ORDER — BUDESONIDE-FORMOTEROL FUMARATE 160-4.5 MCG/ACT IN AERO
2.0000 | INHALATION_SPRAY | Freq: Two times a day (BID) | RESPIRATORY_TRACT | Status: DC
Start: 2015-07-22 — End: 2015-07-26
  Administered 2015-07-23 – 2015-07-25 (×5): 2 via RESPIRATORY_TRACT
  Filled 2015-07-22: qty 6

## 2015-07-22 MED ORDER — METHYLPREDNISOLONE SODIUM SUCC 125 MG IJ SOLR
125.0000 mg | Freq: Once | INTRAMUSCULAR | Status: AC
  Administered 2015-07-22: 125 mg via INTRAVENOUS

## 2015-07-22 MED ORDER — ENOXAPARIN SODIUM 40 MG/0.4ML ~~LOC~~ SOLN
40.0000 mg | SUBCUTANEOUS | Status: DC
  Administered 2015-07-22 – 2015-07-24 (×3): 40 mg via SUBCUTANEOUS
  Filled 2015-07-22 (×3): qty 0.4

## 2015-07-22 MED ORDER — IPRATROPIUM-ALBUTEROL 0.5-2.5 (3) MG/3ML IN SOLN
3.0000 mL | Freq: Four times a day (QID) | RESPIRATORY_TRACT | Status: DC
  Filled 2015-07-22: qty 3

## 2015-07-22 MED ORDER — MORPHINE SULFATE (PF) 2 MG/ML IV SOLN
1.0000 mg | INTRAVENOUS | Status: DC | PRN
  Administered 2015-07-22: 1 mg via INTRAVENOUS
  Filled 2015-07-22: qty 1

## 2015-07-22 MED ORDER — FUROSEMIDE 40 MG PO TABS
40.0000 mg | ORAL_TABLET | Freq: Two times a day (BID) | ORAL | Status: DC
  Administered 2015-07-23 – 2015-07-25 (×5): 40 mg via ORAL
  Filled 2015-07-22 (×5): qty 1

## 2015-07-22 MED ORDER — ADULT MULTIVITAMIN W/MINERALS CH
1.0000 | ORAL_TABLET | Freq: Every day | ORAL | Status: DC
  Administered 2015-07-24 – 2015-07-25 (×2): 1 via ORAL
  Filled 2015-07-22 (×2): qty 1

## 2015-07-22 MED ORDER — ALPRAZOLAM 0.25 MG PO TABS
0.2500 mg | ORAL_TABLET | Freq: Three times a day (TID) | ORAL | Status: DC | PRN
  Administered 2015-07-22 – 2015-07-25 (×3): 0.25 mg via ORAL
  Filled 2015-07-22 (×3): qty 1

## 2015-07-22 MED ORDER — IPRATROPIUM-ALBUTEROL 0.5-2.5 (3) MG/3ML IN SOLN
3.0000 mL | Freq: Four times a day (QID) | RESPIRATORY_TRACT | Status: DC
  Administered 2015-07-22 – 2015-07-25 (×12): 3 mL via RESPIRATORY_TRACT
  Filled 2015-07-22 (×13): qty 3

## 2015-07-22 MED ORDER — ONDANSETRON HCL 4 MG PO TABS: 4.0000 mg | ORAL_TABLET | Freq: Four times a day (QID) | ORAL | Status: DC | PRN

## 2015-07-22 MED ORDER — ASPIRIN EC 81 MG PO TBEC
81.0000 mg | DELAYED_RELEASE_TABLET | Freq: Every day | ORAL | Status: DC
  Administered 2015-07-23 – 2015-07-25 (×3): 81 mg via ORAL
  Filled 2015-07-22 (×3): qty 1

## 2015-07-22 MED ORDER — LEVOFLOXACIN IN D5W 750 MG/150ML IV SOLN
750.0000 mg | Freq: Once | INTRAVENOUS | Status: DC
  Filled 2015-07-22: qty 150

## 2015-07-22 MED ORDER — METHYLPREDNISOLONE SODIUM SUCC 125 MG IJ SOLR
INTRAMUSCULAR | Status: AC
  Administered 2015-07-22: 125 mg via INTRAVENOUS
  Filled 2015-07-22: qty 2

## 2015-07-22 MED ORDER — ALUM & MAG HYDROXIDE-SIMETH 200-200-20 MG/5ML PO SUSP: 30.0000 mL | Freq: Four times a day (QID) | ORAL | Status: DC | PRN

## 2015-07-22 MED ORDER — ACETAMINOPHEN 650 MG RE SUPP: 650.0000 mg | Freq: Four times a day (QID) | RECTAL | Status: DC | PRN

## 2015-07-22 MED ORDER — ONDANSETRON HCL 4 MG/2ML IJ SOLN: 4.0000 mg | Freq: Four times a day (QID) | INTRAMUSCULAR | Status: DC | PRN

## 2015-07-22 MED ORDER — ETODOLAC 500 MG PO TABS
500.0000 mg | ORAL_TABLET | Freq: Two times a day (BID) | ORAL | Status: DC
  Administered 2015-07-23 – 2015-07-24 (×3): 500 mg via ORAL
  Filled 2015-07-22 (×6): qty 1

## 2015-07-22 NOTE — ED Notes (Signed)
Per EMS, patient was recently seen here on the 3rd and dx with a uti. Patient was found today by his neighbor, on the floor of his house, non responsive. Patient lost control of bowels and bladder. EMS states they were unable to hear air movement in either lobes. Dr. Alphonzo Lemmings at bedside, states he can hear some movement in upper lobes. 102 CBG.  BP 190/110. EMS gave 1 duoneb tx and 1 albuterol tx in route. Patient has see-saw breathing. Patient placed on BIPAP. SpO2 100% on biPAP.Patient is able to look at staff when spoken to.

## 2015-07-22 NOTE — ED Provider Notes (Addendum)
[facility] Emergency Department Provider Note  ____________________________________________   I have reviewed the triage vital signs and the nursing notes.   HISTORY  Chief Complaint Respiratory Distress    HPI HANNA AULTMAN is a 70 y.o. male with a history of hypercapnic admissions in the past, COPD, on oxygen, presents today with altered mental status. Patient was according to history that socially provided by the police who have been in touch with his family, at his doctor's office yesterday. He seemed to have some strange respirations yesterday and was left in his living room however at his baseline or close to it. Today, the cleaning lady found him sitting up, off of his oxygen, confused in fecal matter. EMS found the patient with low oxygen saturation and confusion with high blood pressure and decreased breath sounds. They brought the patient emergency traffic here after having given him an albuterol. Patient became gradually more responsive with them on the way in they state. Blood glucose was normal. No further history is available given patient mental status.  Past Medical History  Diagnosis Date  . Antalgic gait   . Hypertension   . Lumbago   . COPD (chronic obstructive pulmonary disease) (HCC)   . Complication of anesthesia     pt has had cervical fusion-limited neck flextion  . Depression   . DJD (degenerative joint disease)     Right shoulder  . Carotid artery occlusion   . Peripheral neuropathy (HCC)   . Chronic neck and back pain     Patient Active Problem List   Diagnosis Date Noted  . Cervical vertebral fusion   . History of lumbar laminectomy   . Chronic respiratory acidosis   . Depression   . Chronic neck and back pain   . Acute on chronic respiratory failure with hypercapnia (HCC)   . Acute on chronic respiratory failure with hypoxia (HCC)   . COPD exacerbation (HCC)   . HCAP (healthcare-associated pneumonia)   . Congestive  dilated cardiomyopathy (HCC)   . Syncope and collapse   . Hyponatremia   . Other specified hypothyroidism   . Acute respiratory failure with hypoxia and hypercarbia (HCC) 08/04/2014  . S/P cervical spinal fusion 08/04/2014  . S/P lumbar laminectomy 08/04/2014  . Right shoulder pain 08/04/2014  . Arthritis of right hip 08/04/2014  . Essential hypertension 08/04/2014  . Hypothyroidism 08/04/2014  . Syncope 08/04/2014  . Fall 08/04/2014  . Low back pain 08/04/2014  . Pedal edema 08/04/2014  . Occlusion and stenosis of carotid artery without mention of cerebral infarction 12/10/2013  . Aftercare following surgery of the circulatory system, NEC 12/10/2013  . Pain of right upper extremity-Right Neck-Shoulder-Back 12/10/2013  . Abdominal aneurysm without mention of rupture 12/03/2011    Past Surgical History  Procedure Laterality Date  . Tonsillectomy    . Carpal tunnel release      bilateral CTS  . Abdominal aortic aneurysm repair  2009    Endovascular AAA repair  . Pilonidal cyst / sinus excision    . Spine surgery  09/15/11    Scar tissue removed- back  . Spine surgery  09/15/11    Scar tissue removed- back  . Neck surgery    . Rotary cuff    . Hammer toe surgery  12/10/2011    Procedure: HAMMER TOE CORRECTION;  Surgeon: Ernestene Kiel, DPM;  Location: [facility];  Service: Podiatry;  Laterality: Right;  Right hammertoe repair second right with 2.5x42 orthopro screw   .  Metatarsal osteotomy  12/10/2011    Procedure: METATARSAL OSTEOTOMY;  Surgeon: Ernestene Kiel, DPM;  Location: [facility];  Service: Podiatry;  Laterality: Right;  Right Keller arthroplasty with size 5 silicone implant right great toe; second metatarsal osteotomy with 2.0x14 screw  . Abdominal aortic aneurysm repair    . Peripheral vascular catheterization N/A 02/11/2015    Procedure: Carotid Angiography;  Surgeon: Nada Libman, MD;  Location: [facility] INVASIVE CV LAB;  Service: Cardiovascular;   Laterality: N/A;    Current Outpatient Rx  Name  Route  Sig  Dispense  Refill  . ALPRAZolam (XANAX) 0.5 MG tablet   Oral   Take 0.25-0.5 mg by mouth every 8 (eight) hours as needed for anxiety.          Marland Kitchen aspirin EC 81 MG tablet   Oral   Take 81 mg by mouth daily.         . budesonide-formoterol (SYMBICORT) 160-4.5 MCG/ACT inhaler   Inhalation   Inhale 2 puffs into the lungs 2 (two) times daily.         Marland Kitchen etodolac (LODINE) 500 MG tablet   Oral   Take 500 mg by mouth 2 (two) times daily.         . furosemide (LASIX) 40 MG tablet   Oral   Take 1 tablet (40 mg total) by mouth 2 (two) times daily.   60 tablet   0   . Ipratropium-Albuterol (COMBIVENT RESPIMAT) 20-100 MCG/ACT AERS respimat   Inhalation   Inhale 1 puff into the lungs every 6 (six) hours.   1 Inhaler   0     For use when you are away from home and can't use  ...   . ipratropium-albuterol (DUONEB) 0.5-2.5 (3) MG/3ML SOLN   Nebulization   Take 3 mLs by nebulization every 6 (six) hours. Patient taking differently: Take 3 mLs by nebulization every 6 (six) hours as needed (for wheezing/shortness of breath).    360 mL   0   . levothyroxine (SYNTHROID, LEVOTHROID) 125 MCG tablet   Oral   Take 1 tablet (125 mcg total) by mouth daily before breakfast.   30 tablet   0   . lisinopril (PRINIVIL,ZESTRIL) 10 MG tablet   Oral   Take 10 mg by mouth daily.         . mirtazapine (REMERON) 15 MG tablet   Oral   Take 15 mg by mouth at bedtime.         . Multiple Vitamin (MULTIVITAMIN WITH MINERALS) TABS tablet   Oral   Take 1 tablet by mouth daily.         Marland Kitchen omeprazole (PRILOSEC) 20 MG capsule   Oral   Take 20 mg by mouth daily.         . sertraline (ZOLOFT) 100 MG tablet   Oral   Take 100 mg by mouth daily.         Marland Kitchen oxyCODONE (ROXICODONE) 5 MG immediate release tablet   Oral   Take 1 tablet (5 mg total) by mouth every 8 (eight) hours as needed. Patient not taking: Reported on 07/22/2015    20 tablet   0     Allergies Ampicillin; Brimonidine; Cyclobenzaprine; Tizanidine; and Gabapentin  Family History  Problem Relation Age of Onset  . Cancer Mother     Stomach  . Hypertension Brother   . Heart attack Father     Social History Social History  Substance Use Topics  .  Smoking status: Former Smoker    Quit date: 08/30/2006  . Smokeless tobacco: Never Used  . Alcohol Use: 1.2 oz/week    2 Cans of beer per week     Comment: occasional use    Review of Systems Cannot further obtain second patient mental status  ____________________________________________   PHYSICAL EXAM:  VITAL SIGNS: ED Triage Vitals  Enc Vitals Group     BP 07/22/15 1430 160/120 mmHg     Pulse Rate 07/22/15 1430 113     Resp 07/22/15 1430 25     Temp 07/22/15 1448 98.8 F (37.1 C)     Temp src --      SpO2 07/22/15 1430 100 %     Weight 07/22/15 1430 250 lb (113.399 kg)     Height 07/22/15 1430  (1.88 m)     Head Cir --      Peak Flow --      Pain Score --      Pain Loc --      Pain Edu? --      Excl. in GC? --     Constitutional: Patient very somnolent but arousable to verbal stimuli and nonfocal Eyes: Conjunctivae are normal. PERRL. EOMI. Head: Slight abrasion noted to forehead Nose: No congestion/rhinnorhea. Mouth/Throat: Mucous membranes are moist.  Oropharynx non-erythematous. Neck: No stridor.   Nontender with no meningismus Cardiovascular: Normal rate, regular rhythm. Grossly normal heart sounds.  Good peripheral circulation. Respiratory: Initially, patient had decreased respiratory drive with shallow breathing, lung sounds showed great diminishment in the bases possibly effort related,. Abdominal: Soft and nontender. No distention. No guarding no rebound Back:  There is no focal tenderness or step off there is no midline tenderness there are no lesions noted. there is no CVA tenderness Musculoskeletal: No lower extremity tenderness. No joint effusions, no DVT  signs strong distal pulses mild edema edema Neurologic:  No obvious focality noted limited exam  Skin:  Skin is warm, dry and intact. Rash to groin area from likely dried urine, as well as blistering to the knees consistent with crawling.   ____________________________________________   LABS (all labs ordered are listed, but only abnormal results are displayed)  Labs Reviewed  BLOOD GAS, ARTERIAL - Abnormal; Notable for the following:    pCO2 arterial 62 (*)    pO2, Arterial 113 (*)    Bicarbonate 35.8 (*)    Acid-Base Excess 8.5 (*)    Allens test (pass/fail) POSITIVE (*)    All other components within normal limits  CULTURE, BLOOD (ROUTINE X 2)  CULTURE, BLOOD (ROUTINE X 2)  URINE CULTURE  COMPREHENSIVE METABOLIC PANEL  URINALYSIS COMPLETEWITH MICROSCOPIC ([facility] ONLY)  ETHANOL  BRAIN NATRIURETIC PEPTIDE  TROPONIN I  CBC WITH DIFFERENTIAL/PLATELET  LACTIC ACID, PLASMA  LACTIC ACID, PLASMA  PROTIME-INR  APTT  CK  TYPE AND SCREEN   ____________________________________________  EKG  I personally interpreted any EKGs ordered by me or triage Sinus rhythm rate 93/m, no acute ST elevation or depression, normal axis, borderline prolonged PR, ____________________________________________  RADIOLOGY  I reviewed any imaging ordered by me or triage that were performed during my shift ____________________________________________   PROCEDURES  Procedure(s) performed: None  Critical Care performed: CRITICAL CARE Performed by: Jeanmarie Plant   Total critical care time: 62 minutes  Critical care time was exclusive of separately billable procedures and treating other patients.  Critical care was necessary to treat or prevent imminent or life-threatening deterioration.  Critical care was time spent  personally by me on the following activities: development of treatment plan with patient and/or surrogate as well as nursing, discussions with consultants, evaluation of  patient's response to treatment, examination of patient, obtaining history from patient or surrogate, ordering and performing treatments and interventions, ordering and review of laboratory studies, ordering and review of radiographic studies, pulse oximetry and re-evaluation of patient's condition.   ____________________________________________   INITIAL IMPRESSION / ASSESSMENT AND PLAN / ED COURSE  Pertinent labs & imaging results that were available during my care of the patient were reviewed by me and considered in my medical decision making (see chart for details). Patient presented today in acute respiratory distress. He was altered. Evidence they've been down for some time was present with fecal matter noted. Patient has a history of hypercapnic arrest. We did immediately place the patient on BiPAP. At this time he is much wider awake and will follow limited commands. It is my hope that we can avoid intubation and solidly gentleman. He has received soluMedrol and albuterol. Chest x-ray does not betray any definitive infiltrate but I did put the patient on antibiotics. As he apparently has anaphylactic-like reaction to penicillin I elected to use Levaquin. This does seem most consistent with hypercapnic arrest, his PCO2 is 62. He has been higher than that in the past however I am reassured by his pH that he hopefully will be able to recover rapidly from this. Awaiting blood work for further information. Also will obtain CT of the head and neck as I cannot rule out trauma although patient was sitting crosslegged on the floor and it does not appear that there is any significant injury noted.  ----------------------------------------- 4:11 PM on 07/22/2015 -----------------------------------------  Vital signs are reassuring, patient is waking up more easily at this time although still somnolent. He has been admitted to the hospitalist. CTs are  pending.  ____________________________________________   FINAL CLINICAL IMPRESSION(S) / ED DIAGNOSES  Final diagnoses:  SOB (shortness of breath)      This chart was dictated using voice recognition software.  Despite best efforts to proofread,  errors can occur which can change meaning.     Jeanmarie Plant, MD 07/22/15 1526  Jeanmarie Plant, MD 07/22/15 8119  Jeanmarie Plant, MD 07/22/15 [phone]

## 2015-07-22 NOTE — Progress Notes (Signed)
eLink Physician-Brief Progress Note Patient Name: Chad Huffman DOB: [birth year] MRN: [id number]   Date of Service  07/22/2015  HPI/Events of Note  Agitated.  eICU Interventions  Ativan 0.5x1 if ineffective then sitter.     Intervention Category Major Interventions: Other:  YACOUB,WESAM 07/22/2015, 11:56 PM

## 2015-07-22 NOTE — Progress Notes (Signed)
I was asked to assess patients agitation, ABG shows significant improvement with BiPAP Will remove biPAP and assess with [place] oxygen, this should help with agitation

## 2015-07-22 NOTE — ED Notes (Signed)
ER MD and admitting MD made aware of patients temp. No new orders received at this time. Will continue to monitor.

## 2015-07-22 NOTE — H&P (Signed)
[facility] - [facility]   PATIENT NAME: Chad Huffman    MR#:  [id number]  DATE OF BIRTH:  07/17/1945  DATE OF ADMISSION:  07/22/2015  PRIMARY CARE PHYSICIAN: Dorothey Baseman, MD   REQUESTING/REFERRING PHYSICIAN: Dr Alphonzo Lemmings  CHIEF COMPLAINT:   Respiratory failure HISTORY OF PRESENT ILLNESS:  Chad Huffman  is a 70 y.o. male with a known history of COPD who presents with above complaint. Patient presented via EMS after being found by his neighbor on the floor of his house nonresponsive. Patient was covered in feces. EMS gave 1 DuoNeb and one albuterol treatment en route. Patient was placed on BiPAP machine in the emergency room and he does opens his eyes when saying his name but is mostly lethargic. Patient received IV Solu-Medrol and Levaquin in the emergency room. CT head and chest x-ray are were performed. Patient is currently on BiPAP machine.  PAST MEDICAL HISTORY:   Past Medical History  Diagnosis Date  . Antalgic gait   . Hypertension   . Lumbago   . COPD (chronic obstructive pulmonary disease) (HCC)   . Complication of anesthesia     pt has had cervical fusion-limited neck flextion  . Depression   . DJD (degenerative joint disease)     Right shoulder  . Carotid artery occlusion   . Peripheral neuropathy (HCC)   . Chronic neck and back pain     PAST SURGICAL HISTORY:   Past Surgical History  Procedure Laterality Date  . Tonsillectomy    . Carpal tunnel release      bilateral CTS  . Abdominal aortic aneurysm repair  2009    Endovascular AAA repair  . Pilonidal cyst / sinus excision    . Spine surgery  09/15/11    Scar tissue removed- back  . Spine surgery  09/15/11    Scar tissue removed- back  . Neck surgery    . Rotary cuff    . Hammer toe surgery  12/10/2011    Procedure: HAMMER TOE CORRECTION;  Surgeon: Ernestene Kiel, DPM;  Location: [facility];  Service: Podiatry;  Laterality: Right;  Right hammertoe repair second  right with 2.5x42 orthopro screw   . Metatarsal osteotomy  12/10/2011    Procedure: METATARSAL OSTEOTOMY;  Surgeon: Ernestene Kiel, DPM;  Location: [facility];  Service: Podiatry;  Laterality: Right;  Right Keller arthroplasty with size 5 silicone implant right great toe; second metatarsal osteotomy with 2.0x14 screw  . Abdominal aortic aneurysm repair    . Peripheral vascular catheterization N/A 02/11/2015    Procedure: Carotid Angiography;  Surgeon: Nada Libman, MD;  Location: [facility] INVASIVE CV LAB;  Service: Cardiovascular;  Laterality: N/A;    SOCIAL HISTORY:   Social History  Substance Use Topics  . Smoking status: Former Smoker    Quit date: 08/30/2006  . Smokeless tobacco: Never Used  . Alcohol Use: 1.2 oz/week    2 Cans of beer per week     Comment: occasional use    FAMILY HISTORY:   Family History  Problem Relation Age of Onset  . Cancer Mother     Stomach  . Hypertension Brother   . Heart attack Father     DRUG ALLERGIES:   Allergies  Allergen Reactions  . Ampicillin Swelling and Other (See Comments)    Unable to obtain enough information to answer additional questions about this medication.    . Brimonidine Other (See Comments)    Reaction:  Unknown   . Cyclobenzaprine Other (See Comments)    Reaction: Hallucinations  . Tizanidine Other (See Comments)    Reaction: Hallucinations  . Gabapentin Rash     REVIEW OF SYSTEMS:  Unable to obtain as the patient is obtunded/lethargic  MEDICATIONS AT HOME:   Prior to Admission medications   Medication Sig Start Date End Date Taking? Authorizing Provider  ALPRAZolam (XANAX) 0.5 MG tablet Take 0.25-0.5 mg by mouth every 8 (eight) hours as needed for anxiety.    Yes Historical Provider, MD  aspirin EC 81 MG tablet Take 81 mg by mouth daily.   Yes Historical Provider, MD  budesonide-formoterol (SYMBICORT) 160-4.5 MCG/ACT inhaler Inhale 2 puffs into the lungs 2 (two) times daily.   Yes Historical Provider,  MD  etodolac (LODINE) 500 MG tablet Take 500 mg by mouth 2 (two) times daily.   Yes Historical Provider, MD  furosemide (LASIX) 40 MG tablet Take 1 tablet (40 mg total) by mouth 2 (two) times daily. 08/12/14  Yes Lonia Blood, MD  Ipratropium-Albuterol (COMBIVENT RESPIMAT) 20-100 MCG/ACT AERS respimat Inhale 1 puff into the lungs every 6 (six) hours. 08/12/14  Yes Lonia Blood, MD  ipratropium-albuterol (DUONEB) 0.5-2.5 (3) MG/3ML SOLN Take 3 mLs by nebulization every 6 (six) hours. Patient taking differently: Take 3 mLs by nebulization every 6 (six) hours as needed (for wheezing/shortness of breath).  08/12/14  Yes Lonia Blood, MD  levothyroxine (SYNTHROID, LEVOTHROID) 125 MCG tablet Take 1 tablet (125 mcg total) by mouth daily before breakfast. 08/12/14  Yes Lonia Blood, MD  lisinopril (PRINIVIL,ZESTRIL) 10 MG tablet Take 10 mg by mouth daily.   Yes Historical Provider, MD  mirtazapine (REMERON) 15 MG tablet Take 15 mg by mouth at bedtime.   Yes Historical Provider, MD  Multiple Vitamin (MULTIVITAMIN WITH MINERALS) TABS tablet Take 1 tablet by mouth daily.   Yes Historical Provider, MD  omeprazole (PRILOSEC) 20 MG capsule Take 20 mg by mouth daily.   Yes Historical Provider, MD  sertraline (ZOLOFT) 100 MG tablet Take 100 mg by mouth daily.   Yes Historical Provider, MD  oxyCODONE (ROXICODONE) 5 MG immediate release tablet Take 1 tablet (5 mg total) by mouth every 8 (eight) hours as needed. Patient not taking: Reported on 07/22/2015 04/23/15 04/22/16  Chinita Pester, FNP      VITAL SIGNS:  Blood pressure 128/69, pulse 91, temperature 99.1 F (37.3 C), resp. rate 20, height  (1.88 m), weight 113.399 kg (250 lb), SpO2 100 %.  PHYSICAL EXAMINATION:  GENERAL:  70 y.o.-year-old patient lying in the bed with BiPAP machine open his eyes when calling his name but overall is not very responsive   EYES: Pupils equal, round, reactive to light and accommodation. No scleral icterus.  Extraocular muscles intact.  HEENT: Head atraumatic, normocephalic. Oropharynx and nasopharynx clear.  NECK:  Supple, no jugular venous distention. No thyroid enlargement, no tenderness.  LUNGS: decreased breath sounds throughout lung fields  no wheezing, rales,rhonchi or crepitation. No use of accessory muscles of respiration.  CARDIOVASCULAR: S1, S2 normal. No murmurs, rubs, or gallops.  ABDOMEN: Soft, nontender, nondistended. Bowel sounds present. No organomegaly or mass.  EXTREMITIES:  right greater than left edema lower extremity without cyanosis or clubbing NEUROLOGIC: patient is attended to perform neurological exam. Babinski sign is negative  PSYCHIATRIC: The patient is alert and oriented x 3.  SKIN: he has abrasions/grapes bilateral knees and feet. Scrotum is erythematous  LABORATORY PANEL:   CBC  Recent  Labs Lab 07/22/15 1439  WBC 13.1*  HGB 12.1*  HCT 37.5*  PLT 142*   ------------------------------------------------------------------------------------------------------------------  Chemistries   Recent Labs Lab 07/22/15 1439  NA 139  K 4.3  CL 98*  CO2 33*  GLUCOSE 102*  BUN 44*  CREATININE 1.58*  CALCIUM 9.1  AST 42*  ALT 22  ALKPHOS 76  BILITOT 2.1*   ------------------------------------------------------------------------------------------------------------------  Cardiac Enzymes  Recent Labs Lab 07/22/15 1439  TROPONINI <0.03   ------------------------------------------------------------------------------------------------------------------  RADIOLOGY:  Dg Chest Port 1 View  07/22/2015  CLINICAL DATA:  Found on responsive, shortness of breath EXAM: PORTABLE CHEST 1 VIEW COMPARISON:  10/28/2014 FINDINGS: Cardiomediastinal silhouette is stable. Elevation of the right hemidiaphragm again noted. Stable right basilar atelectasis or scarring. No segmental infiltrate or pulmonary edema. Metallic fixation material cervical spine again noted. IMPRESSION:  No infiltrate or pulmonary edema. Again noted chronic elevation of the right hemidiaphragm with basilar atelectasis or scarring. Electronically Signed   By: Natasha Mead M.D.   On: 07/22/2015 14:58    EKG:   Normal sinus rhythm no ST elevation or depression  IMPRESSION AND PLAN:    70 year old male with a history of COPD on oxygen who was found by his neighbor on the floor not responsive and apparently did not have his oxygen.  1. Acute on chronic hypoxic/hypercapnic respiratory failure: Patient is currently on BiPAP machine which I will continue. I will consult his pulmonologist, Dr. Meredeth Ide. He was started on Levaquin and steroids in the emergency room which I will continue. Weaned from BiPAP as tolerated.  2. Metabolic encephalopathy: I suspect this is due to hypercapnic/hypoxic respiratory failure. I will order neuro checks every 4 hours. If patient remains with confusion and encephalopathy consider EEG.  3. Hypothyroid: Continue Synthroid and check TSH.  4. Essential hypertension: Continue lisinopril if patient is awake and will also order hydralazine when necessary with parameters.    All the records are reviewed and case discussed with ED provider. CODE STATUS: Full code to the best of our knowledge There is no family  CRITICAL CARE TOTAL TIME TAKING CARE OF THIS PATIENT: 50 minutes.    Fraya Ueda M.D on 07/22/2015 at 4:14 PM  Between 7am to 6pm - Pager - [phone] After 6pm go to [email or web address] - password [email or web address] [facility]  [facility]  Office  [phone]  CC: Primary care physician; Dorothey Baseman, MD

## 2015-07-22 NOTE — Progress Notes (Signed)
ANTIBIOTIC CONSULT NOTE - INITIAL  Pharmacy Consult for levofloxacin Indication: pneumonia (CAP)  Allergies  Allergen Reactions  . Ampicillin Swelling and Other (See Comments)    Unable to obtain enough information to answer additional questions about this medication.    . Brimonidine Other (See Comments)    Reaction:  Unknown   . Cyclobenzaprine Other (See Comments)    Reaction: Hallucinations  . Tizanidine Other (See Comments)    Reaction: Hallucinations  . Gabapentin Rash   Patient Measurements: Height:  (188 cm) Weight: 234 lb 9.1 oz (106.4 kg) IBW/kg (Calculated) : 82.2  Vital Signs: Temp: 99.2 F (37.3 C) (01/31 1900) BP: 122/99 mmHg (01/31 1945) Pulse Rate: 90 (01/31 2025)  Labs:  Recent Labs  07/22/15 1439  WBC 13.1*  HGB 12.1*  PLT 142*  CREATININE 1.58*   Estimated Creatinine Clearance: 59 mL/min (by C-G formula based on Cr of 1.58).   Microbiology: No results found for this or any previous visit (from the past 720 hour(s)).  Assessment: Pharmacy consulted to dose levofloxacin for this 70 year old male admitted with CAP. Patient's CrCl = 59 mL/min.  Plan:  Expected duration 5 days with resolution of temperature and/or normalization of WBCLevofloxacin 750 mg IV daily based on renal function and indication.  Pharmacy will continue to monitor, thank you for the consult.   Cindi Carbon, PharmD Clinical Pharmacist 07/22/2015,8:32 PM

## 2015-07-22 NOTE — ED Notes (Signed)
Patients belongings locked up by security. Key is on patients chart.

## 2015-07-22 NOTE — ED Notes (Signed)
Patient transported to CT 

## 2015-07-23 DIAGNOSIS — J9601 Acute respiratory failure with hypoxia: Secondary | ICD-10-CM

## 2015-07-23 LAB — CBC
HEMATOCRIT: 32.1 % — AB (ref 40.0–52.0)
Hemoglobin: 10.6 g/dL — ABNORMAL LOW (ref 13.0–18.0)
MCH: 29.4 pg (ref 26.0–34.0)
MCHC: 33 g/dL (ref 32.0–36.0)
MCV: 89 fL (ref 80.0–100.0)
PLATELETS: 140 10*3/uL — AB (ref 150–440)
RBC: 3.6 MIL/uL — ABNORMAL LOW (ref 4.40–5.90)
RDW: 18.4 % — AB (ref 11.5–14.5)
WBC: 11.2 10*3/uL — ABNORMAL HIGH (ref 3.8–10.6)

## 2015-07-23 LAB — BASIC METABOLIC PANEL
Anion gap: 6 (ref 5–15)
BUN: 41 mg/dL — AB (ref 6–20)
CHLORIDE: 102 mmol/L (ref 101–111)
CO2: 32 mmol/L (ref 22–32)
CREATININE: 1.39 mg/dL — AB (ref 0.61–1.24)
Calcium: 8.6 mg/dL — ABNORMAL LOW (ref 8.9–10.3)
GFR calc Af Amer: 59 mL/min — ABNORMAL LOW (ref >60)
GFR calc non Af Amer: 51 mL/min — ABNORMAL LOW (ref >60)
GLUCOSE: 137 mg/dL — AB (ref 65–99)
POTASSIUM: 4.5 mmol/L (ref 3.5–5.1)
SODIUM: 140 mmol/L (ref 135–145)

## 2015-07-23 LAB — BLOOD GAS, ARTERIAL
ACID-BASE EXCESS: 9 mmol/L — AB (ref 0.0–3.0)
Allens test (pass/fail): POSITIVE — AB
Bicarbonate: 35 mEq/L — ABNORMAL HIGH (ref 21.0–28.0)
FIO2: 0.32
O2 SAT: 90.2 %
PCO2 ART: 54 mmHg — AB (ref 32.0–48.0)
PH ART: 7.42 (ref 7.350–7.450)
PO2 ART: 58 mmHg — AB (ref 83.0–108.0)
Patient temperature: 37

## 2015-07-23 MED ORDER — LORAZEPAM 2 MG/ML IJ SOLN: 0.5000 mg | INTRAMUSCULAR | Status: DC | PRN

## 2015-07-23 MED ORDER — DEXMEDETOMIDINE HCL IN NACL 400 MCG/100ML IV SOLN
0.4000 ug/kg/h | INTRAVENOUS | Status: DC
  Administered 2015-07-23: 0.4 ug/kg/h via INTRAVENOUS
  Administered 2015-07-23 (×2): 0.5 ug/kg/h via INTRAVENOUS
  Filled 2015-07-23 (×3): qty 100

## 2015-07-23 MED ORDER — PREDNISONE 20 MG PO TABS
60.0000 mg | ORAL_TABLET | Freq: Every day | ORAL | Status: DC
  Administered 2015-07-24: 60 mg via ORAL
  Filled 2015-07-23: qty 3

## 2015-07-23 MED ORDER — FAMOTIDINE 20 MG PO TABS
20.0000 mg | ORAL_TABLET | Freq: Every day | ORAL | Status: DC
  Administered 2015-07-23 – 2015-07-25 (×3): 20 mg via ORAL
  Filled 2015-07-23 (×3): qty 1

## 2015-07-23 NOTE — Progress Notes (Signed)
Patient resting at this time. Arouses to voice. Calm and cooperative. Non-labored breathing.  Will continue to monitor.

## 2015-07-23 NOTE — Progress Notes (Signed)
Patient placed on 4L [place] per Dr. Clovis Fredrickson order; will continue to monitor.

## 2015-07-23 NOTE — Progress Notes (Signed)
Patient appears comfortably after 0.5 mg ativan given; will continue to monitor.

## 2015-07-23 NOTE — Progress Notes (Signed)
Notified Primary Doctors that patient continues to frequent attempt to get out of bed and is becoming more anxious. Patient is facial grimacing and begins to cry but denies being in pain. Patient looks visibly tense and anxious. New orders received for ativan 0.5 mg.

## 2015-07-23 NOTE — Progress Notes (Signed)
RN spoke with Dr. Nicholos Johns regarding patient being on precedex drip and Dr. Juliene Pina placing floor care orders.  Patient will most likely need sitter if coming off precedex drip and that patient was given ativan last night and per RN it worked well.  Dr. Nicholos Johns gave order to attempt to titrate precedex drip off and to use PRN ativan 0.5mg  IV q4H for agitation and to use sitter PRN if needed.

## 2015-07-23 NOTE — Progress Notes (Signed)
At approximately 2200 notified Dr. Corrinne Eagle of patient becoming increasingly more anxious and having frequent attempts of getting out of bed. New orders received for abg.

## 2015-07-23 NOTE — Care Management (Signed)
Admitted to the icu due need for continuous bipap.  Patient very agitated and on precedex drip.  Unable to assess patient due to shortness of breath and agitation.  He was found at home covered in feces.

## 2015-07-23 NOTE — Consult Note (Signed)
[facility] Consultation     ASSESSMENT/PLAN    PULMONARY Acute respiratory failure secondary to acute exacerbation of COPD. -Continue steroids, can likely switch to oral steroid taper now that he is improved. -Continue oxygen, BiPAP at night for the time being. -Continue nebulized DuoNeb's. -Doubt pneumonia, likely acute bronchitis, can likely decrease coverage.  -DVT prophylaxis: Lovenox.  CARDIOVASCULAR Essential hypertension  RENAL A: Acute kidney injury -Continue IV fluids.  GASTROINTESTINAL GI prophylaxis with H2 blocker, advance diet.  HEMATOLOGIC A: --  INFECTIOUS A:  Acute bronchitis -Can likely de-escalate antibiotics. BCx2 07/22/2015, negative thus far. UC 07/22/2015: Negative thus far. Sputum pending Sepsis screen negative.  Levaquin 07/23/2015>>  ENDOCRINE A: Hypothyroidism. -Monitor blood glucose. Given that the patient is on steroids.  NEUROLOGIC A:  Unresponsive, altered mental status, encephalopathy-metabolic. -This was likely secondary to acute hypoxia, this appears to be improving.       ---------------------------------------  ---------------------------------------   Name: Chad Huffman MRN: [id number] DOB: 07-11-1945    ADMISSION DATE:  07/22/2015 CONSULTATION DATE:  07/23/2015  REFERRING MD :  Dr. Juliene Pina  CHIEF COMPLAINT:  Dyspnea.    HISTORY OF PRESENT ILLNESS:    The patient is a 70 year old male who presented with acute respiratory failure and unresponsiveness. Patient does not remember the events leading up to his admission, therefore, all history is obtained from the chart and from staff. The patient has a history of COPD, he sees Dr. Meredeth Ide, last seen in the office in March 2016. He has been maintained on Symbicort and Combivent, he was recommended to go to pulmonary rehabilitation, and to continue using oxygen around the clock.  He was brought to the hospital after being found by a neighbor,  down and unresponsive, with loss of bowel and bladder control. He was then seen by EMS who gave him an albuterol treatment, started on oxygen, he was noted to be in Cheyne-Stokes type respirations, was brought to the ER and started on BiPAP. Subsequently, respiratory, mental status improved. He was admitted to the intensive care unit.   initial blood gas showed a pH 7.37/62/113/35.8, consistent with compensated hypercapnic respiratory failure. He was started on Levaquin, DuoNeb, Solu-Medrol 60 mg IV every 8 hours.   Review of images, and report. CT head and chest x-ray. CT head 07/22/2015, as well as cervical spine, unremarkable.   PAST MEDICAL HISTORY :  Past Medical History  Diagnosis Date  . Antalgic gait   . Hypertension   . Lumbago   . COPD (chronic obstructive pulmonary disease) (HCC)   . Complication of anesthesia     pt has had cervical fusion-limited neck flextion  . Depression   . DJD (degenerative joint disease)     Right shoulder  . Carotid artery occlusion   . Peripheral neuropathy (HCC)   . Chronic neck and back pain    Past Surgical History  Procedure Laterality Date  . Tonsillectomy    . Carpal tunnel release      bilateral CTS  . Abdominal aortic aneurysm repair  2009    Endovascular AAA repair  . Pilonidal cyst / sinus excision    . Spine surgery  09/15/11    Scar tissue removed- back  . Spine surgery  09/15/11    Scar tissue removed- back  . Neck surgery    . Rotary cuff    . Hammer toe surgery  12/10/2011    Procedure: HAMMER TOE CORRECTION;  Surgeon: Ernestene Kiel, DPM;  Location: [facility];  Service: Podiatry;  Laterality: Right;  Right hammertoe repair second right with 2.5x42 orthopro screw   . Metatarsal osteotomy  12/10/2011    Procedure: METATARSAL OSTEOTOMY;  Surgeon: Ernestene Kiel, DPM;  Location: [facility];  Service: Podiatry;  Laterality: Right;  Right Keller arthroplasty with size 5 silicone implant right great toe; second  metatarsal osteotomy with 2.0x14 screw  . Abdominal aortic aneurysm repair    . Peripheral vascular catheterization N/A 02/11/2015    Procedure: Carotid Angiography;  Surgeon: Nada Libman, MD;  Location: [facility] INVASIVE CV LAB;  Service: Cardiovascular;  Laterality: N/A;   Prior to Admission medications   Medication Sig Start Date End Date Taking? Authorizing Provider  ALPRAZolam (XANAX) 0.5 MG tablet Take 0.25-0.5 mg by mouth every 8 (eight) hours as needed for anxiety.    Yes Historical Provider, MD  aspirin EC 81 MG tablet Take 81 mg by mouth daily.   Yes Historical Provider, MD  budesonide-formoterol (SYMBICORT) 160-4.5 MCG/ACT inhaler Inhale 2 puffs into the lungs 2 (two) times daily.   Yes Historical Provider, MD  etodolac (LODINE) 500 MG tablet Take 500 mg by mouth 2 (two) times daily.   Yes Historical Provider, MD  furosemide (LASIX) 40 MG tablet Take 1 tablet (40 mg total) by mouth 2 (two) times daily. 08/12/14  Yes Lonia Blood, MD  Ipratropium-Albuterol (COMBIVENT RESPIMAT) 20-100 MCG/ACT AERS respimat Inhale 1 puff into the lungs every 6 (six) hours. 08/12/14  Yes Lonia Blood, MD  ipratropium-albuterol (DUONEB) 0.5-2.5 (3) MG/3ML SOLN Take 3 mLs by nebulization every 6 (six) hours. Patient taking differently: Take 3 mLs by nebulization every 6 (six) hours as needed (for wheezing/shortness of breath).  08/12/14  Yes Lonia Blood, MD  levothyroxine (SYNTHROID, LEVOTHROID) 125 MCG tablet Take 1 tablet (125 mcg total) by mouth daily before breakfast. 08/12/14  Yes Lonia Blood, MD  lisinopril (PRINIVIL,ZESTRIL) 10 MG tablet Take 10 mg by mouth daily.   Yes Historical Provider, MD  mirtazapine (REMERON) 15 MG tablet Take 15 mg by mouth at bedtime.   Yes Historical Provider, MD  Multiple Vitamin (MULTIVITAMIN WITH MINERALS) TABS tablet Take 1 tablet by mouth daily.   Yes Historical Provider, MD  omeprazole (PRILOSEC) 20 MG capsule Take 20 mg by mouth daily.   Yes Historical  Provider, MD  sertraline (ZOLOFT) 100 MG tablet Take 100 mg by mouth daily.   Yes Historical Provider, MD  oxyCODONE (ROXICODONE) 5 MG immediate release tablet Take 1 tablet (5 mg total) by mouth every 8 (eight) hours as needed. Patient not taking: Reported on 07/22/2015 04/23/15 04/22/16  Chinita Pester, FNP   Allergies  Allergen Reactions  . Ampicillin Swelling and Other (See Comments)    Unable to obtain enough information to answer additional questions about this medication.    . Brimonidine Other (See Comments)    Reaction:  Unknown   . Cyclobenzaprine Other (See Comments)    Reaction: Hallucinations  . Tizanidine Other (See Comments)    Reaction: Hallucinations  . Gabapentin Rash    FAMILY HISTORY:  Family History  Problem Relation Age of Onset  . Cancer Mother     Stomach  . Hypertension Brother   . Heart attack Father    SOCIAL HISTORY:  reports that he quit smoking about 8 years ago. He has never used smokeless tobacco. He reports that he drinks about 1.2 oz of alcohol per week. He reports that he does not use illicit drugs.  REVIEW OF SYSTEMS:Could not be obtained as a the patient is currently on a full facial BiPAP.    VITAL SIGNS: Temp:  [98.7 F (37.1 C)-99.8 F (37.7 C)] 99 F (37.2 C) (02/01 0400) Pulse Rate:  [65-113] 85 (02/01 0700) Resp:  [13-25] 15 (02/01 0700) BP: (78-160)/(55-128) 122/67 mmHg (02/01 0700) SpO2:  [91 %-100 %] 97 % (02/01 0700) FiO2 (%):  [35 %] 35 % (01/31 2200) Weight:  [234 lb 9.1 oz (106.4 kg)-250 lb (113.399 kg)] 234 lb 9.1 oz (106.4 kg) (01/31 1859) HEMODYNAMICS:   VENTILATOR SETTINGS: Vent Mode:  [-]  FiO2 (%):  [35 %] 35 % INTAKE / OUTPUT:  Intake/Output Summary (Last 24 hours) at 07/23/15 0754 Last data filed at 07/23/15 0700  Gross per 24 hour  Intake   1100 ml  Output   1500 ml  Net   -400 ml    Physical Examination:   VS: BP 122/67 mmHg  Pulse 85  Temp(Src) 99 F (37.2 C) (Axillary)  Resp 15  Ht 6\' 2"  (1.88  m)  Wt 234 lb 9.1 oz (106.4 kg)  BMI 30.10 kg/m2  SpO2 97%  General Appearance: No distress  Neuro:without focal findings, mental status, HEENT: PERRLA, EOM intact, no ptosis, Pulmonary: normal breath sounds., diaphragmatic excursion normal.   CardiovascularNormal S1,S2.  No m/r/g.    Abdomen: Benign, Soft, non-tender, No masses, hepatosplenomegaly.  Renal:  No costovertebral tenderness  GU:  Not performed at this time. Endoc: No evident thyromegaly, no signs of acromegaly. Skin:   warm, no rashes, no ecchymosis  Extremities: normal, no cyanosis, clubbing, no edema.    LABS: Reviewed   LABORATORY PANEL:   CBC  Recent Labs Lab 07/23/15 0342  WBC 11.2*  HGB 10.6*  HCT 32.1*  PLT 140*    Chemistries   Recent Labs Lab 07/22/15 1439 07/23/15 0342  NA 139 140  K 4.3 4.5  CL 98* 102  CO2 33* 32  GLUCOSE 102* 137*  BUN 44* 41*  CREATININE 1.58* 1.39*  CALCIUM 9.1 8.6*  AST 42*  --   ALT 22  --   ALKPHOS 76  --   BILITOT 2.1*  --     No results for input(s): GLUCAP in the last 168 hours.  Recent Labs Lab 07/22/15 1431 07/22/15 2232  PHART 7.37 7.49*  PCO2ART 62* 46  PO2ART 113* 73*    Recent Labs Lab 07/22/15 1439  AST 42*  ALT 22  ALKPHOS 76  BILITOT 2.1*  ALBUMIN 4.1    Cardiac Enzymes  Recent Labs Lab 07/22/15 1439  TROPONINI <0.03    RADIOLOGY:  Ct Head Wo Contrast  07/22/2015  CLINICAL DATA:  Fall EXAM: CT HEAD WITHOUT CONTRAST CT CERVICAL SPINE WITHOUT CONTRAST TECHNIQUE: Multidetector CT imaging of the head and cervical spine was performed following the standard protocol without intravenous contrast. Multiplanar CT image reconstructions of the cervical spine were also generated. COMPARISON:  10/28/2014 FINDINGS: CT HEAD FINDINGS Diffuse age-related atrophy. Scattered punctate calcifications right hemisphere unchanged. These are not likely to be of acute clinical significance. There is no evidence of mass. There is no evidence of acute  vascular territory infarct. There is no hydrocephalus. There is no contusion or extra-axial hemorrhage. There is no skull fracture. Postsurgical changes seen in the posterior central occipital region. Minimal inflammatory change in the visualized paranasal sinuses. CT CERVICAL SPINE FINDINGS Again identified is extensive degenerative and postoperative change with posterior decompression. Hardware from the occiput foot to the upper thoracic  spine again identified. Normal alignment. No prevertebral soft tissue swelling. No fracture. Particularly severe C3-4 and C4-5 degenerative disc disease stable. Loosening around the T2 pedicle screws is unchanged. IMPRESSION: No acute intracranial abnormality. Stable chronic postoperative and degenerative changes in the cervical spine. Electronically Signed   By: Esperanza Heir M.D.   On: 07/22/2015 16:26   Ct Cervical Spine Wo Contrast  07/22/2015  CLINICAL DATA:  Fall EXAM: CT HEAD WITHOUT CONTRAST CT CERVICAL SPINE WITHOUT CONTRAST TECHNIQUE: Multidetector CT imaging of the head and cervical spine was performed following the standard protocol without intravenous contrast. Multiplanar CT image reconstructions of the cervical spine were also generated. COMPARISON:  10/28/2014 FINDINGS: CT HEAD FINDINGS Diffuse age-related atrophy. Scattered punctate calcifications right hemisphere unchanged. These are not likely to be of acute clinical significance. There is no evidence of mass. There is no evidence of acute vascular territory infarct. There is no hydrocephalus. There is no contusion or extra-axial hemorrhage. There is no skull fracture. Postsurgical changes seen in the posterior central occipital region. Minimal inflammatory change in the visualized paranasal sinuses. CT CERVICAL SPINE FINDINGS Again identified is extensive degenerative and postoperative change with posterior decompression. Hardware from the occiput foot to the upper thoracic spine again identified. Normal  alignment. No prevertebral soft tissue swelling. No fracture. Particularly severe C3-4 and C4-5 degenerative disc disease stable. Loosening around the T2 pedicle screws is unchanged. IMPRESSION: No acute intracranial abnormality. Stable chronic postoperative and degenerative changes in the cervical spine. Electronically Signed   By: Esperanza Heir M.D.   On: 07/22/2015 16:26   Dg Chest Port 1 View  07/22/2015  CLINICAL DATA:  Found on responsive, shortness of breath EXAM: PORTABLE CHEST 1 VIEW COMPARISON:  10/28/2014 FINDINGS: Cardiomediastinal silhouette is stable. Elevation of the right hemidiaphragm again noted. Stable right basilar atelectasis or scarring. No segmental infiltrate or pulmonary edema. Metallic fixation material cervical spine again noted. IMPRESSION: No infiltrate or pulmonary edema. Again noted chronic elevation of the right hemidiaphragm with basilar atelectasis or scarring. Electronically Signed   By: Natasha Mead M.D.   On: 07/22/2015 14:58       --Wells Guiles, MD.  Board Certified in Internal Medicine, Pulmonary Medicine, Critical Care Medicine, and Sleep Medicine.  [facility]  Santiago Glad, M.D.  Stephanie Acre, M.D.  Billy Fischer, M.D   07/23/2015, 7:54 AM  Critical Care Attestation.  I have personally obtained a history, examined the patient, evaluated laboratory and imaging results, formulated the assessment and plan and placed orders. The Patient requires high complexity decision making for assessment and support, frequent evaluation and titration of therapies, application of advanced monitoring technologies and extensive interpretation of multiple databases. The patient has critical illness that could lead imminently to failure of 1 or more organ systems and requires the highest level of physician preparedness to intervene.  Critical Care Time devoted to patient care services described in this note is 35 minutes and is exclusive of time  spent in procedures.

## 2015-07-23 NOTE — Progress Notes (Signed)
[facility] [place] [facility]   PATIENT NAME: Chad Huffman    MR#:  [id number]  DATE OF BIRTH:  [birth year]  SUBJECTIVE:    Patient is off BiPAP machine and on 3 L nasal cannula. Patient is speaking full sentences and is not working hard to breathe at this time. Patient does not recall the events that took prior to his unresponsive state. He does state that he is a weekend and waking up on Monday and Tuesday morning. REVIEW OF SYSTEMS:    Review of Systems  Constitutional: Negative for fever, chills and malaise/fatigue.  HENT: Negative for sore throat.   Eyes: Negative for blurred vision.  Respiratory: Negative for cough, hemoptysis, shortness of breath and wheezing.   Cardiovascular: Negative for chest pain, palpitations and leg swelling.  Gastrointestinal: Negative for nausea, vomiting, abdominal pain, diarrhea and blood in stool.  Genitourinary: Negative for dysuria.  Musculoskeletal: Negative for back pain.  Neurological: Negative for dizziness, tremors and headaches.  Endo/Heme/Allergies: Does not bruise/bleed easily.    Tolerating Diet: NPO      DRUG ALLERGIES:   Allergies  Allergen Reactions  . Ampicillin Swelling and Other (See Comments)    Unable to obtain enough information to answer additional questions about this medication.    . Brimonidine Other (See Comments)    Reaction:  Unknown   . Cyclobenzaprine Other (See Comments)    Reaction: Hallucinations  . Tizanidine Other (See Comments)    Reaction: Hallucinations  . Gabapentin Rash    VITALS:  Blood pressure 97/58, pulse 67, temperature 98.2 F (36.8 C), temperature source Axillary, resp. rate 13, height 6\' 2"  (1.88 m), weight 106.4 kg (234 lb 9.1 oz), SpO2 98 %.  PHYSICAL EXAMINATION:   Physical Exam  Constitutional: He is oriented to person, place, and time and well-developed, well-nourished, and in no distress. No distress.  HENT:  Head: Normocephalic.  Eyes: No  scleral icterus.  Neck: Normal range of motion. Neck supple. No JVD present. No tracheal deviation present.  Cardiovascular: Normal rate, regular rhythm and normal heart sounds.  Exam reveals no gallop and no friction rub.   No murmur heard. Pulmonary/Chest: Effort normal and breath sounds normal. No respiratory distress. He has no wheezes. He has no rales. He exhibits no tenderness.  Abdominal: Soft. Bowel sounds are normal. He exhibits no distension and no mass. There is no tenderness. There is no rebound and no guarding.  Musculoskeletal: Normal range of motion. He exhibits no edema.  Neurological: He is alert and oriented to person, place, and time.  Skin: Skin is warm. No rash noted. No erythema.  Psychiatric: Affect and judgment normal.      LABORATORY PANEL:   CBC  Recent Labs Lab 07/23/15 0342  WBC 11.2*  HGB 10.6*  HCT 32.1*  PLT 140*   ------------------------------------------------------------------------------------------------------------------  Chemistries   Recent Labs Lab 07/22/15 1439 07/23/15 0342  NA 139 140  K 4.3 4.5  CL 98* 102  CO2 33* 32  GLUCOSE 102* 137*  BUN 44* 41*  CREATININE 1.58* 1.39*  CALCIUM 9.1 8.6*  AST 42*  --   ALT 22  --   ALKPHOS 76  --   BILITOT 2.1*  --    ------------------------------------------------------------------------------------------------------------------  Cardiac Enzymes  Recent Labs Lab 07/22/15 1439  TROPONINI <0.03   ------------------------------------------------------------------------------------------------------------------  RADIOLOGY:  Ct Head Wo Contrast  07/22/2015  CLINICAL DATA:  Fall EXAM: CT HEAD WITHOUT CONTRAST CT CERVICAL SPINE WITHOUT CONTRAST TECHNIQUE: Multidetector CT  imaging of the head and cervical spine was performed following the standard protocol without intravenous contrast. Multiplanar CT image reconstructions of the cervical spine were also generated. COMPARISON:   10/28/2014 FINDINGS: CT HEAD FINDINGS Diffuse age-related atrophy. Scattered punctate calcifications right hemisphere unchanged. These are not likely to be of acute clinical significance. There is no evidence of mass. There is no evidence of acute vascular territory infarct. There is no hydrocephalus. There is no contusion or extra-axial hemorrhage. There is no skull fracture. Postsurgical changes seen in the posterior central occipital region. Minimal inflammatory change in the visualized paranasal sinuses. CT CERVICAL SPINE FINDINGS Again identified is extensive degenerative and postoperative change with posterior decompression. Hardware from the occiput foot to the upper thoracic spine again identified. Normal alignment. No prevertebral soft tissue swelling. No fracture. Particularly severe C3-4 and C4-5 degenerative disc disease stable. Loosening around the T2 pedicle screws is unchanged. IMPRESSION: No acute intracranial abnormality. Stable chronic postoperative and degenerative changes in the cervical spine. Electronically Signed   By: Esperanza Heir M.D.   On: 07/22/2015 16:26   Ct Cervical Spine Wo Contrast  07/22/2015  CLINICAL DATA:  Fall EXAM: CT HEAD WITHOUT CONTRAST CT CERVICAL SPINE WITHOUT CONTRAST TECHNIQUE: Multidetector CT imaging of the head and cervical spine was performed following the standard protocol without intravenous contrast. Multiplanar CT image reconstructions of the cervical spine were also generated. COMPARISON:  10/28/2014 FINDINGS: CT HEAD FINDINGS Diffuse age-related atrophy. Scattered punctate calcifications right hemisphere unchanged. These are not likely to be of acute clinical significance. There is no evidence of mass. There is no evidence of acute vascular territory infarct. There is no hydrocephalus. There is no contusion or extra-axial hemorrhage. There is no skull fracture. Postsurgical changes seen in the posterior central occipital region. Minimal inflammatory change  in the visualized paranasal sinuses. CT CERVICAL SPINE FINDINGS Again identified is extensive degenerative and postoperative change with posterior decompression. Hardware from the occiput foot to the upper thoracic spine again identified. Normal alignment. No prevertebral soft tissue swelling. No fracture. Particularly severe C3-4 and C4-5 degenerative disc disease stable. Loosening around the T2 pedicle screws is unchanged. IMPRESSION: No acute intracranial abnormality. Stable chronic postoperative and degenerative changes in the cervical spine. Electronically Signed   By: Esperanza Heir M.D.   On: 07/22/2015 16:26   Dg Chest Port 1 View  07/22/2015  CLINICAL DATA:  Found on responsive, shortness of breath EXAM: PORTABLE CHEST 1 VIEW COMPARISON:  10/28/2014 FINDINGS: Cardiomediastinal silhouette is stable. Elevation of the right hemidiaphragm again noted. Stable right basilar atelectasis or scarring. No segmental infiltrate or pulmonary edema. Metallic fixation material cervical spine again noted. IMPRESSION: No infiltrate or pulmonary edema. Again noted chronic elevation of the right hemidiaphragm with basilar atelectasis or scarring. Electronically Signed   By: Natasha Mead M.D.   On: 07/22/2015 14:58     ASSESSMENT AND PLAN:    70 year old male with a history of COPD on oxygen who was found by his neighbor on the floor not responsive and apparently did not have his oxygen.  1. Acute on chronic hypoxic/hypercapnic respiratory failure with COPD exacerbation: Patient is now off BiPAP and transition to 3 L nasal cannula. Patient does wear oxygen at home. He predominantly uses oxygen at night he says. Wean IV to oral steroids and continue inhalers and nebulizer.  Continue Levaquin. No evidence of pneumonia and chest x-ray   2. Metabolic encephalopathy: I suspect this is due to hypercapnic/hypoxic respiratory failure. Patient is now at his baseline.  3. Hypothyroid: Continue Synthroid.  TSH is  2.144.  4. Essential hypertension: Continue lisinopril.  Transfer patient to general medicine floor. Physical therapy consultation for disposition.   Management plans discussed with the patient and he is in agreement.  CODE STATUS: Full  TOTAL TIME TAKING CARE OF THIS PATIENT: 30 minutes.     POSSIBLE D/C tomorrow, DEPENDING ON CLINICAL CONDITION.   Mady Oubre M.D on 07/23/2015 at 11:29 AM  Between 7am to 6pm - Pager - [phone] After 6pm go to [email or web address] - password [email or web address] [facility]  [facility]  Office  [phone]  CC: Primary care physician; Dorothey Baseman, MD  Note: This dictation was prepared with Dragon dictation along with smaller phrase technology. Any transcriptional errors that result from this process are unintentional.

## 2015-07-23 NOTE — Progress Notes (Signed)
RN made Dr. Nicholos Johns aware that patient is having some increased work of breathing, confused, restless and agitated trying to get out of bed with O2 sats 92% on 3L nasal cannula.  RN asked about abg?  MD gave order for abg and to start patient on precedex drip.

## 2015-07-24 DIAGNOSIS — J441 Chronic obstructive pulmonary disease with (acute) exacerbation: Secondary | ICD-10-CM

## 2015-07-24 LAB — BASIC METABOLIC PANEL
ANION GAP: 5 (ref 5–15)
BUN: 49 mg/dL — ABNORMAL HIGH (ref 6–20)
CALCIUM: 8.4 mg/dL — AB (ref 8.9–10.3)
CO2: 31 mmol/L (ref 22–32)
Chloride: 102 mmol/L (ref 101–111)
Creatinine, Ser: 1.45 mg/dL — ABNORMAL HIGH (ref 0.61–1.24)
GFR calc non Af Amer: 48 mL/min — ABNORMAL LOW (ref >60)
GFR, EST AFRICAN AMERICAN: 56 mL/min — AB (ref >60)
Glucose, Bld: 118 mg/dL — ABNORMAL HIGH (ref 65–99)
Potassium: 4.5 mmol/L (ref 3.5–5.1)
SODIUM: 138 mmol/L (ref 135–145)

## 2015-07-24 LAB — CBC
HEMATOCRIT: 30 % — AB (ref 40.0–52.0)
HEMOGLOBIN: 9.8 g/dL — AB (ref 13.0–18.0)
MCH: 29.6 pg (ref 26.0–34.0)
MCHC: 32.7 g/dL (ref 32.0–36.0)
MCV: 90.3 fL (ref 80.0–100.0)
Platelets: 132 10*3/uL — ABNORMAL LOW (ref 150–440)
RBC: 3.33 MIL/uL — AB (ref 4.40–5.90)
RDW: 18.4 % — ABNORMAL HIGH (ref 11.5–14.5)
WBC: 8 10*3/uL (ref 3.8–10.6)

## 2015-07-24 LAB — BLOOD GAS, ARTERIAL
ACID-BASE EXCESS: 6.4 mmol/L — AB (ref 0.0–3.0)
ALLENS TEST (PASS/FAIL): POSITIVE — AB
BICARBONATE: 31.8 meq/L — AB (ref 21.0–28.0)
Delivery systems: POSITIVE
Expiratory PAP: 8
FIO2: 0.35
Inspiratory PAP: 16
O2 SAT: 95.7 %
PATIENT TEMPERATURE: 37
PO2 ART: 78 mmHg — AB (ref 83.0–108.0)
pCO2 arterial: 49 mmHg — ABNORMAL HIGH (ref 32.0–48.0)
pH, Arterial: 7.42 (ref 7.350–7.450)

## 2015-07-24 LAB — MAGNESIUM: MAGNESIUM: 2.4 mg/dL (ref 1.7–2.4)

## 2015-07-24 MED ORDER — ETODOLAC 400 MG PO TABS
400.0000 mg | ORAL_TABLET | Freq: Two times a day (BID) | ORAL | Status: DC
  Administered 2015-07-24 – 2015-07-25 (×2): 400 mg via ORAL
  Filled 2015-07-24 (×4): qty 1

## 2015-07-24 MED ORDER — LEVOFLOXACIN 500 MG PO TABS
750.0000 mg | ORAL_TABLET | Freq: Every day | ORAL | Status: DC
  Administered 2015-07-25: 750 mg via ORAL
  Filled 2015-07-24: qty 2

## 2015-07-24 MED ORDER — LEVOTHYROXINE SODIUM 125 MCG PO TABS
125.0000 ug | ORAL_TABLET | Freq: Every day | ORAL | Status: DC
  Administered 2015-07-25: 125 ug via ORAL
  Filled 2015-07-24: qty 1

## 2015-07-24 NOTE — Care Management (Signed)
Patient awake and alert today.  he is on nasal cannula 02 and does have chronic home 02 through [facility].  He says that"this time was not like the last time.  Last time I knew I was hallucinating and stuff and was able to get help.  This time I was sitting in the chair.  I do not remember when I sat down but that is what I told me."  He has a brother that lives in [place].  He says he has a walker and cane and keeps making reference to "I was getting ready to start cleaning the house."  Denies  that his house is cluttered.  he has had home health nurse and therapist in the past and would be agreeable to have it again if is needed.  He was seen in ED 1/3 and treated for UTI.  Home health agency preference is [place].  Heads up referral to [facility] for SN PT Aide SW

## 2015-07-24 NOTE — Progress Notes (Signed)
[facility] Critical Care Medicine Progess Note    ASSESSMENT/PLAN   PULMONARY Acute respiratory failure secondary to acute exacerbation of COPD with acute bronchitis. -Continue steroids, the patient was switched to an oral steroid taper yesterday, will continue. -Continue oxygen, BiPAP at night for the time being, this does not need to be continued outpatient. -Continue nebulized DuoNeb's. -Doubt pneumonia, likely acute bronchitis, can likely decrease coverage. ABG this a.m. showed a pH 7.42./49/78/31.8. This is Indicative of compensated hypercapnic respiratory failure.  -DVT prophylaxis: Lovenox.  CARDIOVASCULAR Essential hypertension. -Appears controlled today, continue lisinopril and when necessary hydralazine.  RENAL A: Acute kidney injury -Continue IV fluids.  GASTROINTESTINAL GI prophylaxis with H2 blocker, advance diet.  HEMATOLOGIC A: --  INFECTIOUS A: Acute bronchitis -Can likely de-escalate antibiotics.  MRSA screen negative BCx2 07/22/2015, negative thus far. UC 07/22/2015: Negative thus far. Sputum pending   Levaquin 07/23/2015>>  ENDOCRINE A: Hypothyroidism. -Blood glucose appears controlled over the last 24 hours, continue to monitor.  NEUROLOGIC A: Unresponsive, altered mental status, encephalopathy-metabolic. -This was likely secondary to acute hypoxia, this appears to be improving. The patient does have some agitation, will use as needed Ativan.   ---------------------------------------   ----------------------------------------   Name: Chad Huffman MRN: [id number] DOB: [birth year]    ADMISSION DATE:  07/22/2015    SUBJECTIVE:   Pt currently on the BiPAP, can not provide history or review of systems.     VITAL SIGNS: Temp:  [97.4 F (36.3 C)-97.6 F (36.4 C)] 97.6 F (36.4 C) (02/02 0600) Pulse Rate:  [32-101] 53 (02/02 0600) Resp:  [11-17] 15 (02/02 0600) BP: (97-147)/(58-90) 123/83 mmHg (02/02 0600) SpO2:  [70 %-100  %] 100 % (02/02 0806) FiO2 (%):  [32 %] 32 % (02/01 1458) HEMODYNAMICS:   VENTILATOR SETTINGS: Vent Mode:  [-]  FiO2 (%):  [32 %] 32 % INTAKE / OUTPUT:  Intake/Output Summary (Last 24 hours) at 07/24/15 0813 Last data filed at 07/24/15 0600  Gross per 24 hour  Intake 3103.51 ml  Output   1950 ml  Net 1153.51 ml    PHYSICAL EXAMINATION: Physical Examination:   VS: BP 123/83 mmHg  Pulse 53  Temp(Src) 97.6 F (36.4 C) (Axillary)  Resp 15  Ht  (1.88 m)  Wt 234 lb 9.1 oz (106.4 kg)  BMI 30.10 kg/m2  SpO2 100%  General Appearance: No distress  Neuro:without focal findings,  HEENT: PERRLA, EOM intact. Pulmonary: normal breath sounds  , decreased air entry in both lungs. CardiovascularNormal S1,S2.  No m/r/g.   Abdomen: Benign, Soft, non-tender. Renal:  No costovertebral tenderness  GU:  Not performed at this time. Endocrine: No evident thyromegaly. Skin:   warm, no rashes, no ecchymosis  Extremities: normal, no cyanosis, clubbing.   LABS:   LABORATORY PANEL:   CBC  Recent Labs Lab 07/24/15 0127  WBC 8.0  HGB 9.8*  HCT 30.0*  PLT 132*    Chemistries   Recent Labs Lab 07/22/15 1439  07/24/15 0127  NA 139  < > 138  K 4.3  < > 4.5  CL 98*  < > 102  CO2 33*  < > 31  GLUCOSE 102*  < > 118*  BUN 44*  < > 49*  CREATININE 1.58*  < > 1.45*  CALCIUM 9.1  < > 8.4*  MG  --   --  2.4  AST 42*  --   --   ALT 22  --   --   ALKPHOS 76  --   --  BILITOT 2.1*  --   --   < > = values in this interval not displayed.  No results for input(s): GLUCAP in the last 168 hours.  Recent Labs Lab 07/22/15 2232 07/23/15 0931 07/24/15 0355  PHART 7.49* 7.42 7.42  PCO2ART 46 54* 49*  PO2ART 73* 58* 78*    Recent Labs Lab 07/22/15 1439  AST 42*  ALT 22  ALKPHOS 76  BILITOT 2.1*  ALBUMIN 4.1    Cardiac Enzymes  Recent Labs Lab 07/22/15 1439  TROPONINI <0.03    RADIOLOGY:  Ct Head Wo Contrast  07/22/2015  CLINICAL DATA:  Fall EXAM: CT HEAD  WITHOUT CONTRAST CT CERVICAL SPINE WITHOUT CONTRAST TECHNIQUE: Multidetector CT imaging of the head and cervical spine was performed following the standard protocol without intravenous contrast. Multiplanar CT image reconstructions of the cervical spine were also generated. COMPARISON:  10/28/2014 FINDINGS: CT HEAD FINDINGS Diffuse age-related atrophy. Scattered punctate calcifications right hemisphere unchanged. These are not likely to be of acute clinical significance. There is no evidence of mass. There is no evidence of acute vascular territory infarct. There is no hydrocephalus. There is no contusion or extra-axial hemorrhage. There is no skull fracture. Postsurgical changes seen in the posterior central occipital region. Minimal inflammatory change in the visualized paranasal sinuses. CT CERVICAL SPINE FINDINGS Again identified is extensive degenerative and postoperative change with posterior decompression. Hardware from the occiput foot to the upper thoracic spine again identified. Normal alignment. No prevertebral soft tissue swelling. No fracture. Particularly severe C3-4 and C4-5 degenerative disc disease stable. Loosening around the T2 pedicle screws is unchanged. IMPRESSION: No acute intracranial abnormality. Stable chronic postoperative and degenerative changes in the cervical spine. Electronically Signed   By: Esperanza Heir M.D.   On: 07/22/2015 16:26   Ct Cervical Spine Wo Contrast  07/22/2015  CLINICAL DATA:  Fall EXAM: CT HEAD WITHOUT CONTRAST CT CERVICAL SPINE WITHOUT CONTRAST TECHNIQUE: Multidetector CT imaging of the head and cervical spine was performed following the standard protocol without intravenous contrast. Multiplanar CT image reconstructions of the cervical spine were also generated. COMPARISON:  10/28/2014 FINDINGS: CT HEAD FINDINGS Diffuse age-related atrophy. Scattered punctate calcifications right hemisphere unchanged. These are not likely to be of acute clinical significance.  There is no evidence of mass. There is no evidence of acute vascular territory infarct. There is no hydrocephalus. There is no contusion or extra-axial hemorrhage. There is no skull fracture. Postsurgical changes seen in the posterior central occipital region. Minimal inflammatory change in the visualized paranasal sinuses. CT CERVICAL SPINE FINDINGS Again identified is extensive degenerative and postoperative change with posterior decompression. Hardware from the occiput foot to the upper thoracic spine again identified. Normal alignment. No prevertebral soft tissue swelling. No fracture. Particularly severe C3-4 and C4-5 degenerative disc disease stable. Loosening around the T2 pedicle screws is unchanged. IMPRESSION: No acute intracranial abnormality. Stable chronic postoperative and degenerative changes in the cervical spine. Electronically Signed   By: Esperanza Heir M.D.   On: 07/22/2015 16:26   Dg Chest Port 1 View  07/22/2015  CLINICAL DATA:  Found on responsive, shortness of breath EXAM: PORTABLE CHEST 1 VIEW COMPARISON:  10/28/2014 FINDINGS: Cardiomediastinal silhouette is stable. Elevation of the right hemidiaphragm again noted. Stable right basilar atelectasis or scarring. No segmental infiltrate or pulmonary edema. Metallic fixation material cervical spine again noted. IMPRESSION: No infiltrate or pulmonary edema. Again noted chronic elevation of the right hemidiaphragm with basilar atelectasis or scarring. Electronically Signed   By: Natasha Mead  M.D.   On: 07/22/2015 14:58       --Wells Guiles, MD.  Pager [phone] [facility] Office Number: [phone]  Santiago Glad, M.D.  Stephanie Acre, M.D.  Billy Fischer, M.D

## 2015-07-24 NOTE — Progress Notes (Signed)
eLink Physician-Brief Progress Note Patient Name: Chad Huffman DOB: 1945/08/11 MRN: [id number]   Date of Service  07/24/2015  HPI/Events of Note  Bradycardia to the 40s with PVCs  eICU Interventions  Stop precedex gtt, check BMP, Mg, EKG     Intervention Category Major Interventions: Arrhythmia - evaluation and management  Elta Angell 07/24/2015, 2:39 AM

## 2015-07-24 NOTE — Progress Notes (Signed)
PHARMACIST - PHYSICIAN COMMUNICATION DR:   Mody CONCERNING: Antibiotic IV to Oral Route Change Policy  RECOMMENDATION: This patient is receiving Levaquin by the intravenous route.  Based on criteria approved by the Pharmacy and Therapeutics Committee, the antibiotic(s) is/are being converted to the equivalent oral dose form(s).   DESCRIPTION: These criteria include:  Patient being treated for a respiratory tract infection, urinary tract infection, cellulitis or clostridium difficile associated diarrhea if on metronidazole  The patient is not neutropenic and does not exhibit a GI malabsorption state  The patient is eating (either orally or via tube) and/or has been taking other orally administered medications for a least 24 hours  The patient is improving clinically and has a Tmax < 100.5  If you have questions about this conversion, please contact the Pharmacy Department  []  ( [phone] )  [place] [x]  ( [phone] )  [facility] []  ( [phone]  [place] []  ( [phone]  [facility] []  ( [phone] )  [facility]   Macaela Presas, PharmD Clinical Pharmacist  

## 2015-07-24 NOTE — Progress Notes (Signed)
eLink Physician-Brief Progress Note Patient Name: Chad Huffman DOB: Nov 15, 1945 MRN: [id number]   Date of Service  07/24/2015  HPI/Events of Note  Improving HR. Mg=2.4. EKG with sinus brady, type 1 AV block  eICU Interventions  Cont with monitoring.      Intervention Category Intermediate Interventions: Arrhythmia - evaluation and management  Jeanet Lupe 07/24/2015, 3:18 AM

## 2015-07-24 NOTE — Progress Notes (Signed)
Pt having frequent PVC's; bigeminy; trigeminy and bradycardia heart rate 50's notified Dr. Anne Hahn md stated he would place orders for a repeat BMP

## 2015-07-24 NOTE — Progress Notes (Addendum)
[facility] - [place] at [facility]   PATIENT NAME: Chad Huffman    MR#:  [id number]  DATE OF BIRTH:  Jul 19, 1945  SUBJECTIVE:    Patient had bradycardia last night and Precedex was discontinued. Patient does not appear agitated. REVIEW OF SYSTEMS:    Review of Systems  Constitutional: Negative for fever, chills and malaise/fatigue.  HENT: Negative for sore throat.   Eyes: Negative for blurred vision.  Respiratory: Negative for cough, hemoptysis, shortness of breath and wheezing.   Cardiovascular: Negative for chest pain, palpitations and leg swelling.  Gastrointestinal: Negative for nausea, vomiting, abdominal pain, diarrhea and blood in stool.  Genitourinary: Negative for dysuria.  Musculoskeletal: Negative for back pain.  Neurological: Negative for dizziness, tremors and headaches.  Endo/Heme/Allergies: Does not bruise/bleed easily.    Tolerating Diet: yes     DRUG ALLERGIES:   Allergies  Allergen Reactions  . Ampicillin Swelling and Other (See Comments)    Unable to obtain enough information to answer additional questions about this medication.    . Brimonidine Other (See Comments)    Reaction:  Unknown   . Cyclobenzaprine Other (See Comments)    Reaction: Hallucinations  . Tizanidine Other (See Comments)    Reaction: Hallucinations  . Gabapentin Rash    VITALS:  Blood pressure 124/98, pulse 79, temperature 98.1 F (36.7 C), temperature source Oral, resp. rate 19, height  (1.88 m), weight 106.4 kg (234 lb 9.1 oz), SpO2 100 %.  PHYSICAL EXAMINATION:   Physical Exam  Constitutional: He is oriented to person, place, and time and well-developed, well-nourished, and in no distress. No distress.  HENT:  Head: Normocephalic.  Eyes: No scleral icterus.  Neck: Normal range of motion. Neck supple. No JVD present. No tracheal deviation present.  Cardiovascular: Normal rate, regular rhythm and normal heart sounds.  Exam reveals no gallop  and no friction rub.   No murmur heard. Pulmonary/Chest: Effort normal and breath sounds normal. No respiratory distress. He has no wheezes. He has no rales. He exhibits no tenderness.  Abdominal: Soft. Bowel sounds are normal. He exhibits no distension and no mass. There is no tenderness. There is no rebound and no guarding.  Musculoskeletal: Normal range of motion. He exhibits no edema.  Neurological: He is alert and oriented to person, place, and time.  Skin: Skin is warm. No rash noted. No erythema.  Wounds on knees are covered.  Psychiatric: Affect and judgment normal.      LABORATORY PANEL:   CBC  Recent Labs Lab 07/24/15 0127  WBC 8.0  HGB 9.8*  HCT 30.0*  PLT 132*   ------------------------------------------------------------------------------------------------------------------  Chemistries   Recent Labs Lab 07/22/15 1439  07/24/15 0127  NA 139  < > 138  K 4.3  < > 4.5  CL 98*  < > 102  CO2 33*  < > 31  GLUCOSE 102*  < > 118*  BUN 44*  < > 49*  CREATININE 1.58*  < > 1.45*  CALCIUM 9.1  < > 8.4*  MG  --   --  2.4  AST 42*  --   --   ALT 22  --   --   ALKPHOS 76  --   --   BILITOT 2.1*  --   --   < > = values in this interval not displayed. ------------------------------------------------------------------------------------------------------------------  Cardiac Enzymes  Recent Labs Lab 07/22/15 1439  TROPONINI <0.03   ------------------------------------------------------------------------------------------------------------------  RADIOLOGY:  Ct Head Wo Contrast  07/22/2015  CLINICAL DATA:  Fall EXAM: CT HEAD WITHOUT CONTRAST CT CERVICAL SPINE WITHOUT CONTRAST TECHNIQUE: Multidetector CT imaging of the head and cervical spine was performed following the standard protocol without intravenous contrast. Multiplanar CT image reconstructions of the cervical spine were also generated. COMPARISON:  10/28/2014 FINDINGS: CT HEAD FINDINGS Diffuse age-related  atrophy. Scattered punctate calcifications right hemisphere unchanged. These are not likely to be of acute clinical significance. There is no evidence of mass. There is no evidence of acute vascular territory infarct. There is no hydrocephalus. There is no contusion or extra-axial hemorrhage. There is no skull fracture. Postsurgical changes seen in the posterior central occipital region. Minimal inflammatory change in the visualized paranasal sinuses. CT CERVICAL SPINE FINDINGS Again identified is extensive degenerative and postoperative change with posterior decompression. Hardware from the occiput foot to the upper thoracic spine again identified. Normal alignment. No prevertebral soft tissue swelling. No fracture. Particularly severe C3-4 and C4-5 degenerative disc disease stable. Loosening around the T2 pedicle screws is unchanged. IMPRESSION: No acute intracranial abnormality. Stable chronic postoperative and degenerative changes in the cervical spine. Electronically Signed   By: Esperanza Heir M.D.   On: 07/22/2015 16:26   Ct Cervical Spine Wo Contrast  07/22/2015  CLINICAL DATA:  Fall EXAM: CT HEAD WITHOUT CONTRAST CT CERVICAL SPINE WITHOUT CONTRAST TECHNIQUE: Multidetector CT imaging of the head and cervical spine was performed following the standard protocol without intravenous contrast. Multiplanar CT image reconstructions of the cervical spine were also generated. COMPARISON:  10/28/2014 FINDINGS: CT HEAD FINDINGS Diffuse age-related atrophy. Scattered punctate calcifications right hemisphere unchanged. These are not likely to be of acute clinical significance. There is no evidence of mass. There is no evidence of acute vascular territory infarct. There is no hydrocephalus. There is no contusion or extra-axial hemorrhage. There is no skull fracture. Postsurgical changes seen in the posterior central occipital region. Minimal inflammatory change in the visualized paranasal sinuses. CT CERVICAL SPINE  FINDINGS Again identified is extensive degenerative and postoperative change with posterior decompression. Hardware from the occiput foot to the upper thoracic spine again identified. Normal alignment. No prevertebral soft tissue swelling. No fracture. Particularly severe C3-4 and C4-5 degenerative disc disease stable. Loosening around the T2 pedicle screws is unchanged. IMPRESSION: No acute intracranial abnormality. Stable chronic postoperative and degenerative changes in the cervical spine. Electronically Signed   By: Esperanza Heir M.D.   On: 07/22/2015 16:26   Dg Chest Port 1 View  07/22/2015  CLINICAL DATA:  Found on responsive, shortness of breath EXAM: PORTABLE CHEST 1 VIEW COMPARISON:  10/28/2014 FINDINGS: Cardiomediastinal silhouette is stable. Elevation of the right hemidiaphragm again noted. Stable right basilar atelectasis or scarring. No segmental infiltrate or pulmonary edema. Metallic fixation material cervical spine again noted. IMPRESSION: No infiltrate or pulmonary edema. Again noted chronic elevation of the right hemidiaphragm with basilar atelectasis or scarring. Electronically Signed   By: Natasha Mead M.D.   On: 07/22/2015 14:58     ASSESSMENT AND PLAN:    70 year old male with a history of COPD on oxygen who was found by his neighbor on the floor not responsive and apparently did not have his oxygen.  1. Acute on chronic hypoxic/hypercapnic respiratory failure with COPD exacerbation:  He is off BiPAP and on oxygen. Continue by mouth steroids, nebs and inhalers. Continue Levaquin with plan to stop this on Sunday.. No evidence of pneumonia and chest x-ray   2. Metabolic encephalopathy: This was due to hypercapnic/hypoxic respiratory failure. Patient is at his baseline.  3. Hypothyroid: Continue Synthroid.  TSH is 2.144.  4. Essential hypertension: Continue lisinopril. Blood pressure is acceptable.   5. Asymptomatic bradycardia with first-degree AV block: Her beta  blockers and calcium channel blockers. Continue telemetry.   Transfer patient to general medicine floor. Physical therapy consultation for disposition.   Management plans discussed with the patient and he is in agreement.  CODE STATUS: Full  TOTAL TIME TAKING CARE OF THIS PATIENT: 25 minutes.     POSSIBLE D/C tomorrow, DEPENDING ON CLINICAL CONDITION.   Kiwana Deblasi M.D on 07/24/2015 at 11:33 AM  Between 7am to 6pm - Pager - [phone] After 6pm go to [email or web address] - password [email or web address] [facility]  [facility]  Office  [phone]  CC: Primary care physician; Dorothey Baseman, MD  Note: This dictation was prepared with Dragon dictation along with smaller phrase technology. Any transcriptional errors that result from this process are unintentional.

## 2015-07-24 NOTE — Progress Notes (Signed)
ANTIBIOTIC CONSULT NOTE - INITIAL  Pharmacy Consult for levofloxacin Indication: pneumonia (CAP)  Allergies  Allergen Reactions  . Ampicillin Swelling and Other (See Comments)    Unable to obtain enough information to answer additional questions about this medication.    . Brimonidine Other (See Comments)    Reaction:  Unknown   . Cyclobenzaprine Other (See Comments)    Reaction: Hallucinations  . Tizanidine Other (See Comments)    Reaction: Hallucinations  . Gabapentin Rash   Patient Measurements: Height:  (188 cm) Weight: 234 lb 9.1 oz (106.4 kg) IBW/kg (Calculated) : 82.2  Vital Signs: Temp: 98.1 F (36.7 C) (02/02 0800) Temp Source: Oral (02/02 0800) BP: 97/56 mmHg (02/02 1400) Pulse Rate: 108 (02/02 1400)  Labs:  Recent Labs  07/22/15 1439 07/23/15 0342 07/24/15 0127  WBC 13.1* 11.2* 8.0  HGB 12.1* 10.6* 9.8*  PLT 142* 140* 132*  CREATININE 1.58* 1.39* 1.45*   Estimated Creatinine Clearance: 64.3 mL/min (by C-G formula based on Cr of 1.45).   Microbiology: Recent Results (from the past 720 hour(s))  Culture, blood (routine x 2)     Status: None (Preliminary result)   Collection Time: 07/22/15  2:20 PM  Result Value Ref Range Status   Specimen Description BLOOD LEFT AC  Final   Special Requests BOTTLES DRAWN AEROBIC AND ANAEROBIC  Final   Culture NO GROWTH 2 DAYS  Final   Report Status PENDING  Incomplete  Urine culture     Status: None (Preliminary result)   Collection Time: 07/22/15  2:29 PM  Result Value Ref Range Status   Specimen Description URINE, RANDOM  Final   Special Requests NONE  Final   Culture NO GROWTH < 24 HOURS  Final   Report Status PENDING  Incomplete  Culture, blood (routine x 2)     Status: None (Preliminary result)   Collection Time: 07/22/15  2:39 PM  Result Value Ref Range Status   Specimen Description BLOOD RIGHT HAND  Final   Special Requests   Final    BOTTLES DRAWN AEROBIC AND ANAEROBIC AER ANA   Culture NO GROWTH 2 DAYS  Final   Report Status PENDING  Incomplete  MRSA PCR Screening     Status: None   Collection Time: 07/22/15  7:49 PM  Result Value Ref Range Status   MRSA by PCR NEGATIVE NEGATIVE Final    Comment:        The GeneXpert MRSA Assay (FDA approved for NASAL specimens only), is one component of a comprehensive MRSA colonization surveillance program. It is not intended to diagnose MRSA infection nor to guide or monitor treatment for MRSA infections.     Assessment: Pharmacy consulted to dose levofloxacin for this 70 year old male admitted with CAP.   Plan:  Continue Levaquin 750 mg po daily with plans to stop on Sunday per MD.   Pharmacy will continue to monitor, thank you for the consult.   Valentina Gu, PharmD Clinical Pharmacist 07/24/2015,2:53 PM

## 2015-07-24 NOTE — Evaluation (Signed)
Physical Therapy Evaluation Patient Details Name: Chad Huffman MRN: [id number] DOB: 1945/09/13 Today's Date: 07/24/2015   History of Present Illness  Samba Cumba is a 70 y.o. male with a known history of COPD who presents with above complaint. Patient presented via EMS after being found by his neighbor on the floor of his house nonresponsive. Patient was covered in feces. EMS gave 1 DuoNeb and one albuterol treatment en route. Patient was placed on BiPAP machine in the emergency room and he does opens his eyes when saying his name but is mostly lethargic. Patient received IV Solu-Medrol and Levaquin in the emergency room. CT head and chest x-ray are were performed. Pt was confused and agitated yesterday but is apparently at baseline today. Pt reports approximately 5 falls in the last 12 months. Pt reports that prior to current admission he had graduated from using a rolling walker to a single point cane.  Clinical Impression  Pt demonstrates generalized weakness with poor cardiopulmonary response to exercises. Excessive HR and dropping SaO2 during limited ambulation outside room. Pt requires assist from bed mobility and ambulation. He is tremulous and shaky with ambulation although corrected with rolling walker. Pt with history of 5 falls in the last 12 months and is a high risk for recurrent falls. Pt would benefit from SNF placement at discharge in order to facilitate return to optimal level of function at home. Pt will benefit from skilled PT services to address deficits in strength, balance, and mobility in order to return to full function at home.     Follow Up Recommendations SNF    Equipment Recommendations  None recommended by PT    Recommendations for Other Services Rehab consult     Precautions / Restrictions Precautions Precautions: Fall Restrictions Weight Bearing Restrictions: No      Mobility  Bed Mobility Overal bed mobility: Needs Assistance Bed Mobility: Supine to Sit      Supine to sit: Min assist     General bed mobility comments: Assist with torso from supine to sitting  Transfers Overall transfer level: Needs assistance Equipment used: Rolling walker (2 wheeled) Transfers: Sit to/from Stand Sit to Stand: Min guard         General transfer comment: Pt demonstrates instability with transfers but corrected with UE support. He appears tremulous and requires UE support for balance. Slight increase in time but overall fair transfer time.  Ambulation/Gait Ambulation/Gait assistance: Min guard;+2 safety/equipment Ambulation Distance (Feet): 80 Feet Assistive device: Rolling walker (2 wheeled) Gait Pattern/deviations: Decreased step length - right;Decreased step length - left Gait velocity: Decreased Gait velocity interpretation: <1.8 ft/sec, indicative of risk for recurrent falls General Gait Details: Pt ambulates short distance outside of room. He appears tremulous and unsteady but corrected with rolling walker. +2 present to assist with equipment. Pt with resting HR 110 which increases to around 130 with ambulation. Pt denies DOE however SaO2 intermittently drops to upper 80's on 3L/min supplemental O2. Cues for safety with ambulation and turns.   Stairs            Wheelchair Mobility    Modified Rankin (Stroke Patients Only)       Balance Overall balance assessment: Needs assistance Sitting-balance support: No upper extremity supported Sitting balance-Leahy Scale: Fair     Standing balance support: Bilateral upper extremity supported Standing balance-Leahy Scale: Poor Standing balance comment: Pt requires UE support. Attempted narrow stance and pt unable to achieve or maintain.  Pertinent Vitals/Pain Pain Assessment: No/denies pain    Home Living Family/patient expects to be discharged to:: Private residence Living Arrangements: Alone Available Help at Discharge: Other (Comment)  (Unsure) Type of Home: House Home Access: Stairs to enter Entrance Stairs-Rails: Can reach both Entrance Stairs-Number of Steps: 4 Home Layout: One level Home Equipment: Walker - 2 wheels;Cane - single point;Bedside commode;Shower seat (no grab bars, no hosptial bed, no wheelchair)      Prior Function Level of Independence: Independent with assistive device(s)         Comments: used cane primarly     Hand Dominance        Extremity/Trunk Assessment   Upper Extremity Assessment: RUE deficits/detail RUE Deficits / Details: 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 LUE grossly 4 to 4+/5.         Lower Extremity Assessment: Generalized weakness (However at least 4/5 throughout. Denies numbness/tingling)         Communication   Communication: No difficulties  Cognition Arousal/Alertness: Awake/alert Behavior During Therapy: WFL for tasks assessed/performed Overall Cognitive Status: Within Functional Limits for tasks assessed                      General Comments      Exercises        Assessment/Plan    PT Assessment Patient needs continued PT services  PT Diagnosis Difficulty walking;Abnormality of gait;Generalized weakness   PT Problem List Decreased strength;Decreased activity tolerance;Decreased balance;Decreased mobility;Decreased safety awareness;Obesity  PT Treatment Interventions DME instruction;Gait training;Stair training;Functional mobility training;Therapeutic activities;Therapeutic exercise;Balance training;Neuromuscular re-education;Patient/family education   PT Goals (Current goals can be found in the Care Plan section) Acute Rehab PT Goals Patient Stated Goal: "I want to get back home." Pt open to considerning SNF placement as needed PT Goal Formulation: With patient Time For Goal Achievement: 08/07/15 Potential to Achieve Goals: Good    Frequency Min 2X/week   Barriers to discharge Inaccessible home  environment;Decreased caregiver support 4 steps to enter, pt lives alone    Co-evaluation               End of Session Equipment Utilized During Treatment: Gait belt Activity Tolerance: Patient tolerated treatment well Patient left: in bed;with call bell/phone within reach;with bed alarm set Nurse Communication: Mobility status;Other (comment) (Rectal temperature probe)         Time: 1610-9604 PT Time Calculation (min) (ACUTE ONLY): 42 min   Charges:   PT Evaluation $PT Eval High Complexity: 1 Procedure PT Treatments $Gait Training: 8-22 mins   PT G Codes:       Sharalyn Ink Huprich PT, DPT   Huprich,Jason 07/24/2015, 3:19 PM

## 2015-07-25 LAB — URINE CULTURE: CULTURE: NO GROWTH

## 2015-07-25 LAB — CBC
HEMATOCRIT: 32.2 % — AB (ref 40.0–52.0)
HEMOGLOBIN: 10.6 g/dL — AB (ref 13.0–18.0)
MCH: 29.6 pg (ref 26.0–34.0)
MCHC: 32.9 g/dL (ref 32.0–36.0)
MCV: 90 fL (ref 80.0–100.0)
Platelets: 146 10*3/uL — ABNORMAL LOW (ref 150–440)
RBC: 3.58 MIL/uL — AB (ref 4.40–5.90)
RDW: 18.6 % — ABNORMAL HIGH (ref 11.5–14.5)
WBC: 9 10*3/uL (ref 3.8–10.6)

## 2015-07-25 LAB — BASIC METABOLIC PANEL
Anion gap: 7 (ref 5–15)
BUN: 48 mg/dL — AB (ref 6–20)
CALCIUM: 8.5 mg/dL — AB (ref 8.9–10.3)
CHLORIDE: 100 mmol/L — AB (ref 101–111)
CO2: 32 mmol/L (ref 22–32)
Creatinine, Ser: 1.46 mg/dL — ABNORMAL HIGH (ref 0.61–1.24)
GFR calc Af Amer: 56 mL/min — ABNORMAL LOW (ref >60)
GFR, EST NON AFRICAN AMERICAN: 48 mL/min — AB (ref >60)
GLUCOSE: 106 mg/dL — AB (ref 65–99)
POTASSIUM: 4.1 mmol/L (ref 3.5–5.1)
SODIUM: 139 mmol/L (ref 135–145)

## 2015-07-25 MED ORDER — PREDNISONE 50 MG PO TABS
50.0000 mg | ORAL_TABLET | Freq: Every day | ORAL | Status: DC
  Administered 2015-07-25: 50 mg via ORAL
  Filled 2015-07-25 (×2): qty 1

## 2015-07-25 MED ORDER — PREDNISONE 10 MG PO TABS: 10.0000 mg | ORAL_TABLET | Freq: Every day | ORAL | Status: DC

## 2015-07-25 MED ORDER — LEVOFLOXACIN 750 MG PO TABS: 750.0000 mg | ORAL_TABLET | Freq: Every day | ORAL | Status: DC

## 2015-07-25 NOTE — Clinical Social Work Note (Signed)
Clinical Social Work Assessment  Patient Details  Name: Chad Huffman MRN: [id number] Date of Birth: [birth year]  Date of referral:  07/25/15               Reason for consult:  Discharge Planning                Permission sought to share information with:  Facility Medical sales representative, Family Supports Permission granted to share information::  Yes, Verbal Permission Granted  Name::     POA- Brother Chad Huffman  Agency::  yes  Relationship::  yes  Contact Information:  yes  Housing/Transportation Living arrangements for the past 2 months:  Single Family Home Source of Information:  Patient Patient Interpreter Needed:  None Criminal Activity/Legal Involvement Pertinent to Current Situation/Hospitalization:  No - Comment as needed Significant Relationships:  Siblings Lives with:  Self Do you feel safe going back to the place where you live?    yes but  i need to get [facility] in a rehab Need for family participation in patient care:  Yes (Comment)  Care giving concerns:  Plans to return to living independently   Social Worker assessment / plan: Will follow up with PT consult, assess patient needs and send out bed request for patient  Employment status:  Retired Community education officer information:  Medicare, Other (Comment Required) Monia Pouch) PT Recommendations:  Inpatient Rehab Consult Information / Referral to community resources:  Acute Rehab  Patient/Family's Response to care:  He feels going to rehab a good idea at this time and is looking over rehab selections Patient/Family's Understanding of and Emotional Response to Diagnosis, Current Treatment, and Prognosis:  [name] ( brother) feels good about his brother going to placment.  Emotional Assessment Appearance:  Appears stated age Attitude/Demeanor/Rapport:   Polite and good attitude Affect (typically observed):  Accepting, Adaptable, Happy, Hopeful, Calm, Pleasant, Stable Orientation:  Oriented to Self, Oriented to Place,  Oriented to  Time, Oriented to Situation Alcohol / Substance use:  Alcohol Use Psych involvement (Current and /or in the community):  No (Comment)  Discharge Needs  Concerns to be addressed:  No discharge needs identified Readmission within the last 30 days:  No Current discharge risk:  None Barriers to Discharge:  No Barriers Identified  [id number]  Cheron Schaumann, LCSW 07/25/2015, 9:58 AM

## 2015-07-25 NOTE — Clinical Social Work Note (Signed)
Chad Huffman, DSS APS supervisor called this CSW to notify that they have a report from the police regarding patient and his home situation. DSS will follow up with patient at [facility]. [name] MSW,LcSW [phone]

## 2015-07-25 NOTE — Progress Notes (Signed)
EMS called for transport and per EMS personal, EMS is backed up with emergency calls at this time and staff was unable to give this RN an ETA.

## 2015-07-25 NOTE — Progress Notes (Signed)
LCSW called [employer] and got pt number [phone] A.LCSW will add number to [facility]

## 2015-07-25 NOTE — Clinical Social Work Note (Signed)
This CSW following up from previous CSW documentation from this morning. It appears physician placed discharge orders in for discharge to SNF today when bed is available. Bed offers made to patient and he has chosen [facility]. Discharge information sent and nurse to call report. Patient requests transport via EMS.  [name] MSW,LCSW [phone]

## 2015-07-25 NOTE — Progress Notes (Signed)
LCSW met with patient and completed his assessment and Fl2, discussed Rehab centers and he is OK with [place] finding him a facility, would like to stay in the [place] are that accepts Medicare and [employer]. Once pts rehab is done patient plans to return home and has numerous friend checking in daily and his brother.  LCSW will fax out bed requests and advise other CSW

## 2015-07-25 NOTE — Progress Notes (Signed)
LCSW sent out patient information ( initial CSW referral)( Fl2 ) via the HUB for bed offers,information to CSW will handed off  to see who makes offer.     Raha Tennison LCSW

## 2015-07-25 NOTE — NC FL2 (Signed)
[place] MEDICAID FL2 LEVEL OF CARE SCREENING TOOL     IDENTIFICATION  Patient Name: Chad Huffman Birthdate: March 31, 1946 Sex: male Admission Date (Current Location): 07/22/2015  [place] and [place] Number:  Chiropodist and Address:  [facility], [address] [place], [place] [address]      Provider Number: [id number]  Attending Physician Name and Address:  Adrian Saran, MD  Relative Name and Phone Number:       Current Level of Care: SNF Recommended Level of Care: Skilled Nursing Facility Prior Approval Number:    Date Approved/Denied:   PASRR Number:   [id number]   Discharge Plan: SNF    Current Diagnoses: Patient Active Problem List   Diagnosis Date Noted  . Respiratory failure (HCC) 07/22/2015  . Cervical vertebral fusion   . History of lumbar laminectomy   . Chronic respiratory acidosis   . Depression   . Chronic neck and back pain   . Acute on chronic respiratory failure with hypercapnia (HCC)   . Acute on chronic respiratory failure with hypoxia (HCC)   . COPD exacerbation (HCC)   . HCAP (healthcare-associated pneumonia)   . Congestive dilated cardiomyopathy (HCC)   . Syncope and collapse   . Hyponatremia   . Other specified hypothyroidism   . Acute respiratory failure with hypoxia and hypercarbia (HCC) 08/04/2014  . S/P cervical spinal fusion 08/04/2014  . S/P lumbar laminectomy 08/04/2014  . Right shoulder pain 08/04/2014  . Arthritis of right hip 08/04/2014  . Essential hypertension 08/04/2014  . Hypothyroidism 08/04/2014  . Syncope 08/04/2014  . Fall 08/04/2014  . Low back pain 08/04/2014  . Pedal edema 08/04/2014  . Occlusion and stenosis of carotid artery without mention of cerebral infarction 12/10/2013  . Aftercare following surgery of the circulatory system, NEC 12/10/2013  . Pain of right upper extremity-Right Neck-Shoulder-Back 12/10/2013  . Abdominal aneurysm without mention of rupture 12/03/2011     Orientation RESPIRATION BLADDER Height & Weight     Self, Time, Situation, Place  O2 (3-4 litres of 02) Continent Weight: 234 lb 9.1 oz (106.4 kg) Height:   (188 cm)  BEHAVIORAL SYMPTOMS/MOOD NEUROLOGICAL BOWEL NUTRITION STATUS      Continent Diet (Heart healthy- salt free diet)  AMBULATORY STATUS COMMUNICATION OF NEEDS Skin   Limited Assist Verbally Bruising, Skin abrasions                       Personal Care Assistance Level of Assistance  Bathing, Feeding, Dressing, Total care Bathing Assistance: Maximum assistance Feeding assistance: Maximum assistance Dressing Assistance: Maximum assistance Total Care Assistance: Maximum assistance   Functional Limitations Info  Sight, Hearing, Speech Sight Info: Impaired Hearing Info: Adequate Speech Info: Adequate    SPECIAL CARE FACTORS FREQUENCY  PT (By licensed PT)     PT Frequency: 3-5x week              Contractures      Additional Factors Info  Allergies   Allergies Info:  Allergies AmpiccillanBrimonadine GabapentinTizanadine           Current Medications (07/25/2015):  This is the current hospital active medication list Current Facility-Administered Medications  Medication Dose Route Frequency Provider Last Rate Last Dose  . 0.9 %  sodium chloride infusion   Intravenous Continuous Adrian Saran, MD 100 mL/hr at 07/25/15 0517    . acetaminophen (TYLENOL) tablet 650 mg  650 mg Oral Q6H PRN Adrian Saran, MD  Or  . acetaminophen (TYLENOL) suppository 650 mg  650 mg Rectal Q6H PRN Adrian Saran, MD      . ALPRAZolam Prudy Feeler) tablet 0.25-0.5 mg  0.25-0.5 mg Oral Q8H PRN Adrian Saran, MD   0.25 mg at 07/25/15 0151  . alum & mag hydroxide-simeth (MAALOX/MYLANTA) 200-200-20 MG/5ML suspension 30 mL  30 mL Oral Q6H PRN Adrian Saran, MD      . aspirin EC tablet 81 mg  81 mg Oral Daily Sital Mody, MD   81 mg at 07/24/15 1013  . budesonide-formoterol (SYMBICORT) 160-4.5 MCG/ACT inhaler 2 puff  2 puff Inhalation BID Adrian Saran, MD   2 puff at 07/25/15 0753  . enoxaparin (LOVENOX) injection 40 mg  40 mg Subcutaneous Q24H Adrian Saran, MD   40 mg at 07/24/15 2130  . etodolac (LODINE) tablet 400 mg  400 mg Oral BID Oralia Manis, MD   400 mg at 07/24/15 2213  . famotidine (PEPCID) tablet 20 mg  20 mg Oral Daily Shane Crutch, MD   20 mg at 07/24/15 1013  . furosemide (LASIX) tablet 40 mg  40 mg Oral BID Adrian Saran, MD   40 mg at 07/25/15 0755  . hydrALAZINE (APRESOLINE) injection 10 mg  10 mg Intravenous Q6H PRN Sital Mody, MD      . ipratropium-albuterol (DUONEB) 0.5-2.5 (3) MG/3ML nebulizer solution 3 mL  3 mL Inhalation Q6H Sital Mody, MD   3 mL at 07/25/15 0826  . levofloxacin (LEVAQUIN) IVPB 750 mg  750 mg Intravenous Once Jeanmarie Plant, MD   Stopped at 07/22/15 1759  . levofloxacin (LEVAQUIN) tablet 750 mg  750 mg Oral QPC supper Adrian Saran, MD   750 mg at 07/25/15 0746  . levothyroxine (SYNTHROID, LEVOTHROID) tablet 125 mcg  125 mcg Oral Q0600 Adrian Saran, MD   125 mcg at 07/25/15 0517  . lisinopril (PRINIVIL,ZESTRIL) tablet 10 mg  10 mg Oral Daily Adrian Saran, MD   10 mg at 07/24/15 1013  . LORazepam (ATIVAN) injection 0.5 mg  0.5 mg Intravenous Q4H PRN Shane Crutch, MD      . mirtazapine (REMERON) tablet 15 mg  15 mg Oral QHS Adrian Saran, MD   15 mg at 07/24/15 2131  . morphine 2 MG/ML injection 1 mg  1 mg Intravenous Q4H PRN Adrian Saran, MD   1 mg at 07/22/15 2306  . multivitamin with minerals tablet 1 tablet  1 tablet Oral Daily Adrian Saran, MD   1 tablet at 07/24/15 1013  . ondansetron (ZOFRAN) tablet 4 mg  4 mg Oral Q6H PRN Adrian Saran, MD       Or  . ondansetron (ZOFRAN) injection 4 mg  4 mg Intravenous Q6H PRN Sital Mody, MD      . predniSONE (DELTASONE) tablet 50 mg  50 mg Oral Q breakfast Sital Mody, MD      . senna-docusate (Senokot-S) tablet 1 tablet  1 tablet Oral QHS PRN Adrian Saran, MD      . sertraline (ZOLOFT) tablet 100 mg  100 mg Oral Daily Sital Mody, MD   100 mg at 07/24/15 1013  .  sodium chloride flush (NS) 0.9 % injection 3 mL  3 mL Intravenous Q12H Adrian Saran, MD   3 mL at 07/24/15 2130     Discharge Medications: Please see discharge summary for a list of discharge medications.  Relevant Imaging Results:  Relevant Lab Results:   Additional Information SSN [id number]  Cheron Schaumann, [place]

## 2015-07-25 NOTE — Progress Notes (Signed)
ANTIBIOTIC CONSULT NOTE - INITIAL  Pharmacy Consult for levofloxacin Indication: pneumonia (CAP)  Allergies  Allergen Reactions  . Ampicillin Swelling and Other (See Comments)    Unable to obtain enough information to answer additional questions about this medication.    . Brimonidine Other (See Comments)    Reaction:  Unknown   . Cyclobenzaprine Other (See Comments)    Reaction: Hallucinations  . Tizanidine Other (See Comments)    Reaction: Hallucinations  . Gabapentin Rash   Patient Measurements: Height:  (188 cm) Weight: 234 lb 9.1 oz (106.4 kg) IBW/kg (Calculated) : 82.2  Vital Signs: Temp: 98.7 F (37.1 C) (02/03 1200) Temp Source: Oral (02/03 1200) BP: 144/86 mmHg (02/03 1500) Pulse Rate: 85 (02/03 1511)  Labs:  Recent Labs  07/23/15 0342 07/24/15 0127 07/25/15 0347  WBC 11.2* 8.0 9.0  HGB 10.6* 9.8* 10.6*  PLT 140* 132* 146*  CREATININE 1.39* 1.45* 1.46*   Estimated Creatinine Clearance: 63.8 mL/min (by C-G formula based on Cr of 1.46).   Microbiology: Recent Results (from the past 720 hour(s))  Culture, blood (routine x 2)     Status: None (Preliminary result)   Collection Time: 07/22/15  2:20 PM  Result Value Ref Range Status   Specimen Description BLOOD LEFT AC  Final   Special Requests BOTTLES DRAWN AEROBIC AND ANAEROBIC  Final   Culture NO GROWTH 3 DAYS  Final   Report Status PENDING  Incomplete  Urine culture     Status: None   Collection Time: 07/22/15  2:29 PM  Result Value Ref Range Status   Specimen Description URINE, RANDOM  Final   Special Requests NONE  Final   Culture NO GROWTH 2 DAYS  Final   Report Status 07/25/2015 FINAL  Final  Culture, blood (routine x 2)     Status: None (Preliminary result)   Collection Time: 07/22/15  2:39 PM  Result Value Ref Range Status   Specimen Description BLOOD RIGHT HAND  Final   Special Requests   Final    BOTTLES DRAWN AEROBIC AND ANAEROBIC AER ANA   Culture NO GROWTH 3 DAYS   Final   Report Status PENDING  Incomplete  MRSA PCR Screening     Status: None   Collection Time: 07/22/15  7:49 PM  Result Value Ref Range Status   MRSA by PCR NEGATIVE NEGATIVE Final    Comment:        The GeneXpert MRSA Assay (FDA approved for NASAL specimens only), is one component of a comprehensive MRSA colonization surveillance program. It is not intended to diagnose MRSA infection nor to guide or monitor treatment for MRSA infections.     Assessment: Pharmacy consulted to dose levofloxacin for this 70 year old male admitted with CAP.   Plan:  Continue Levaquin 750 mg po daily with plans to stop on Sunday per MD.   Pharmacy will continue to monitor, thank you for the consult.   Valentina Gu, PharmD Clinical Pharmacist 07/25/2015,3:15 PM

## 2015-07-25 NOTE — Discharge Summary (Signed)
[facility] -  at [facility]   PATIENT NAME: Chad Huffman    MR#:  [id number]  DATE OF BIRTH:  09/29/1945  DATE OF ADMISSION:  07/22/2015 ADMITTING PHYSICIAN: Adrian Saran, MD  DATE OF DISCHARGE: 07/25/2015   PRIMARY CARE PHYSICIAN: Dorothey Baseman, MD    ADMISSION DIAGNOSIS:  SOB (shortness of breath) [R06.02]  DISCHARGE DIAGNOSIS:  Active Problems:   Respiratory failure (HCC)   SECONDARY DIAGNOSIS:   Past Medical History  Diagnosis Date  . Antalgic gait   . Hypertension   . Lumbago   . COPD (chronic obstructive pulmonary disease) (HCC)   . Complication of anesthesia     pt has had cervical fusion-limited neck flextion  . Depression   . DJD (degenerative joint disease)     Right shoulder  . Carotid artery occlusion   . Peripheral neuropathy (HCC)   . Chronic neck and back pain     HOSPITAL COURSE:  70 year old male with a history of COPD on oxygen who was found by his neighbor on the floor not responsive and did not have his oxygen.  1. Acute on chronic hypoxic/hypercapnic respiratory failure with COPD exacerbation:  He was initially placed on BiPAP and this was weaned off and now he is on oxygen. He should remain on oxygen to keep oxygen saturations greater than 90%. He was started on IV steroids and transitioned oral steroids. He will continue steroid taper with nebulizer treatment and inhalers.  He will continue with Levaquin for 5 more days.  There waso evidence of pneumonia and chest x-ray He will follow-up with Dr. Meredeth Ide in 1 week.  2. Metabolic encephalopathy: This was due to hypercapnic/hypoxic respiratory failure. Patient is at his baseline.   3. Hypothyroid: Continue Synthroid. TSH is 2.144.  4. Essential hypertension: Continue lisinopril. Blood pressure is acceptable.   5. Asymptomatic bradycardia with first-degree AV block: Avoid  beta blockers and calcium channel blockers.  DISCHARGE CONDITIONS AND DIET:   Patient is stable for discharge on a heart healthy/cardiac diet CONSULTS OBTAINED:  Treatment Team:  Shane Crutch, MD  DRUG ALLERGIES:   Allergies  Allergen Reactions  . Ampicillin Swelling and Other (See Comments)    Unable to obtain enough information to answer additional questions about this medication.    . Brimonidine Other (See Comments)    Reaction:  Unknown   . Cyclobenzaprine Other (See Comments)    Reaction: Hallucinations  . Tizanidine Other (See Comments)    Reaction: Hallucinations  . Gabapentin Rash    DISCHARGE MEDICATIONS:   Current Discharge Medication List    START taking these medications   Details  levofloxacin (LEVAQUIN) 750 MG tablet Take 1 tablet (750 mg total) by mouth daily after supper. Qty: 5 tablet, Refills: 0    predniSONE (DELTASONE) 10 MG tablet Take 1 tablet (10 mg total) by mouth daily with breakfast. 50 mg PO (BY MOUTH) x 2 days 40 mg PO x 2 days 30 mg PO x 2 days 20 mg PO x 2 days 10 mg PO x 2 days then stop Qty: 20 tablet, Refills: 0      CONTINUE these medications which have NOT CHANGED   Details  ALPRAZolam (XANAX) 0.5 MG tablet Take 0.25-0.5 mg by mouth every 8 (eight) hours as needed for anxiety.     aspirin EC 81 MG tablet Take 81 mg by mouth daily.    budesonide-formoterol (SYMBICORT) 160-4.5 MCG/ACT inhaler Inhale 2 puffs into the lungs 2 (two) times daily.  etodolac (LODINE) 500 MG tablet Take 500 mg by mouth 2 (two) times daily.    furosemide (LASIX) 40 MG tablet Take 1 tablet (40 mg total) by mouth 2 (two) times daily. Qty: 60 tablet, Refills: 0    Ipratropium-Albuterol (COMBIVENT RESPIMAT) 20-100 MCG/ACT AERS respimat Inhale 1 puff into the lungs every 6 (six) hours. Qty: 1 Inhaler, Refills: 0    ipratropium-albuterol (DUONEB) 0.5-2.5 (3) MG/3ML SOLN Take 3 mLs by nebulization every 6 (six) hours. Qty: 360 mL, Refills: 0    levothyroxine (SYNTHROID, LEVOTHROID) 125 MCG tablet Take 1 tablet (125 mcg total)  by mouth daily before breakfast. Qty: 30 tablet, Refills: 0    lisinopril (PRINIVIL,ZESTRIL) 10 MG tablet Take 10 mg by mouth daily.    mirtazapine (REMERON) 15 MG tablet Take 15 mg by mouth at bedtime.    Multiple Vitamin (MULTIVITAMIN WITH MINERALS) TABS tablet Take 1 tablet by mouth daily.    omeprazole (PRILOSEC) 20 MG capsule Take 20 mg by mouth daily.    sertraline (ZOLOFT) 100 MG tablet Take 100 mg by mouth daily.    oxyCODONE (ROXICODONE) 5 MG immediate release tablet Take 1 tablet (5 mg total) by mouth every 8 (eight) hours as needed. Qty: 20 tablet, Refills: 0              Today   CHIEF COMPLAINT:   Patient is doing well this morning. Patient reports no shortness of breath or chest pain.   VITAL SIGNS:  Blood pressure 167/82, pulse 77, temperature 98.6 F (37 C), temperature source Oral, resp. rate 19, height  (1.88 m), weight 106.4 kg (234 lb 9.1 oz), SpO2 94 %.   REVIEW OF SYSTEMS:  Review of Systems  Constitutional: Negative for fever, chills and malaise/fatigue.  HENT: Negative for sore throat.   Eyes: Negative for blurred vision.  Respiratory: Negative for cough, hemoptysis, shortness of breath and wheezing.   Cardiovascular: Negative for chest pain, palpitations and leg swelling.  Gastrointestinal: Negative for nausea, vomiting, abdominal pain, diarrhea and blood in stool.  Genitourinary: Negative for dysuria.  Musculoskeletal: Negative for back pain.  Neurological: Negative for dizziness, tremors and headaches.  Endo/Heme/Allergies: Does not bruise/bleed easily.     PHYSICAL EXAMINATION:  GENERAL:  70 y.o.-year-old patient lying in the bed with no acute distress.  NECK:  Supple, no jugular venous distention. No thyroid enlargement, no tenderness.  LUNGS: Normal breath sounds bilaterally, no wheezing, rales,rhonchi  No use of accessory muscles of respiration.  CARDIOVASCULAR: S1, S2 normal. No murmurs, rubs, or gallops.  ABDOMEN: Soft,  non-tender, non-distended. Bowel sounds present. No organomegaly or mass.  EXTREMITIES: No pedal edema, cyanosis, or clubbing.  PSYCHIATRIC: The patient is alert and oriented x 3.  SKIN: He has skin tears on his knees that are covered. DATA REVIEW:   CBC  Recent Labs Lab 07/25/15 0347  WBC 9.0  HGB 10.6*  HCT 32.2*  PLT 146*    Chemistries   Recent Labs Lab 07/22/15 1439  07/24/15 0127 07/25/15 0347  NA 139  < > 138 139  K 4.3  < > 4.5 4.1  CL 98*  < > 102 100*  CO2 33*  < > 31 32  GLUCOSE 102*  < > 118* 106*  BUN 44*  < > 49* 48*  CREATININE 1.58*  < > 1.45* 1.46*  CALCIUM 9.1  < > 8.4* 8.5*  MG  --   --  2.4  --   AST 42*  --   --   --  ALT 22  --   --   --   ALKPHOS 76  --   --   --   BILITOT 2.1*  --   --   --   < > = values in this interval not displayed.  Cardiac Enzymes  Recent Labs Lab 07/22/15 1439  TROPONINI <0.03    Microbiology Results  @  RADIOLOGY:  No results found.    Management plans discussed with the patient and he is in agreement. Stable for discharge   Patient should follow up with PCP and Dr. Meredeth Ide  CODE STATUS:     Code Status Orders        Start     Ordered   07/22/15 1930  Full code   Continuous     07/22/15 1929    Code Status History    Date Active Date Inactive Code Status Order ID Comments User Context   02/11/2015 10:28 AM 02/11/2015  6:15 PM Full Code [id number]  Nada Libman, MD Inpatient   08/04/2014  8:13 PM 08/13/2014  2:53 PM Full Code [id number]  Lynden Oxford, MD Inpatient    Advance Directive Documentation        Most Recent Value   Type of Advance Directive  Living will   Pre-existing out of facility DNR order (yellow form or pink MOST form)     "MOST" Form in Place?        TOTAL TIME TAKING CARE OF THIS PATIENT: 35 minutes.    Note: This dictation was prepared with Dragon dictation along with smaller phrase technology. Any transcriptional errors that result from this process are  unintentional.  Genoveva Singleton M.D on 07/25/2015 at 9:55 AM  Between 7am to 6pm - Pager - [phone] After 6pm go to [email or web address] - password [email or web address] [facility]  [facility]  Office  [phone]  CC: Primary care physician; Dorothey Baseman, MD

## 2015-07-25 NOTE — Clinical Social Work Placement (Signed)
   CLINICAL SOCIAL WORK PLACEMENT  NOTE  Date:  07/25/2015  Patient Details  Name: Chad Huffman MRN: [id number] Date of Birth: 1946-01-17  Clinical Social Work is seeking post-discharge placement for this patient at the Skilled  Nursing Facility level of care (*CSW will initial, date and re-position this form in  chart as items are completed):  Yes   Patient/family provided with [place] Clinical Social Work Department's list of facilities offering this level of care within the geographic area requested by the patient (or if unable, by the patient's family).  Yes   Patient/family informed of their freedom to choose among providers that offer the needed level of care, that participate in Medicare, Medicaid or managed care program needed by the patient, have an available bed and are willing to accept the patient.  Yes   Patient/family informed of [facility]'s ownership interest in [facility] and [facility], as well as of the fact that they are under no obligation to receive care at these facilities.  PASRR submitted to EDS on 07/25/15     PASRR number received on 07/25/15     Existing PASRR number confirmed on       FL2 transmitted to all facilities in geographic area requested by pt/family on 07/25/15     FL2 transmitted to all facilities within larger geographic area on       Patient informed that his/her managed care company has contracts with or will negotiate with certain facilities, including the following:        Yes   Patient/family informed of bed offers received.  Patient chooses bed at  [facility])     Physician recommends and patient chooses bed at  [facility])    Patient to be transferred to  ([facility]) on 07/25/15.  Patient to be transferred to facility by  ([facility])     Patient family notified on 07/25/15 of transfer.  Name of family member notified:   (patient's brother)     PHYSICIAN       Additional Comment:     _______________________________________________ [name], LCSW 07/25/2015, 1:44 PM

## 2015-07-25 NOTE — Progress Notes (Signed)
Report called to [place], LPN at [facility].

## 2015-07-25 NOTE — Progress Notes (Signed)
Security was called and came and returned patient's items that were locked up including 3 medication bottles with pills and gold colored watch.

## 2015-07-29 LAB — CULTURE, BLOOD (ROUTINE X 2)
CULTURE: NO GROWTH
Culture: NO GROWTH

## 2015-08-06 IMAGING — US ULTRASOUND RETROPERITONEAL COMPLETE
1 series · 14 of 25 positions shown · non-contrast
Comparison: None.

CLINICAL DATA: Back pain.  Prior abdominal aortic aneurysm repair.

EXAM:
ULTRASOUND OF ABDOMINAL AORTA
TECHNIQUE: Ultrasound examination of the abdominal aorta was performed to
evaluate for abdominal aortic aneurysm.

[Series 1: ultrasound retroperitoneal complete · 0.38mm/px · 14 of 36 slices shown]
[im 1/36]
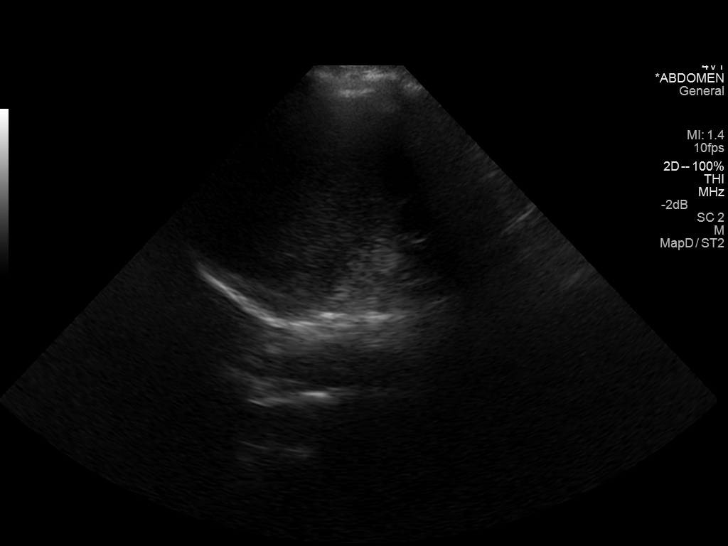
[im 3/36]
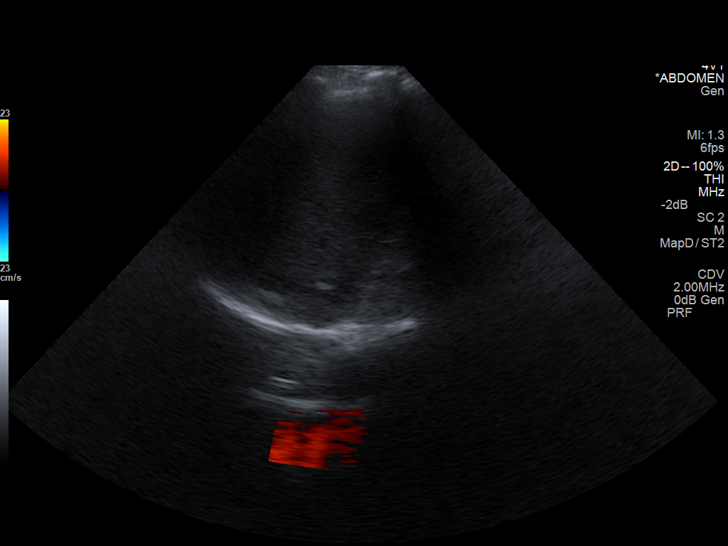
[im 6/36]
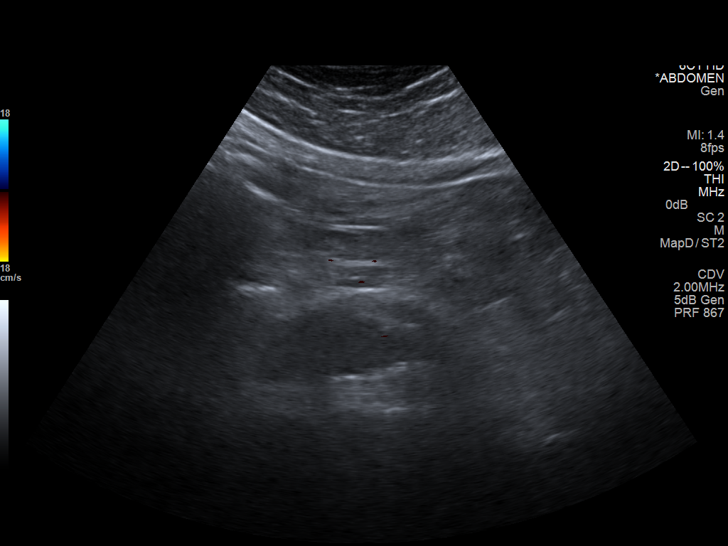
[im 9/36]
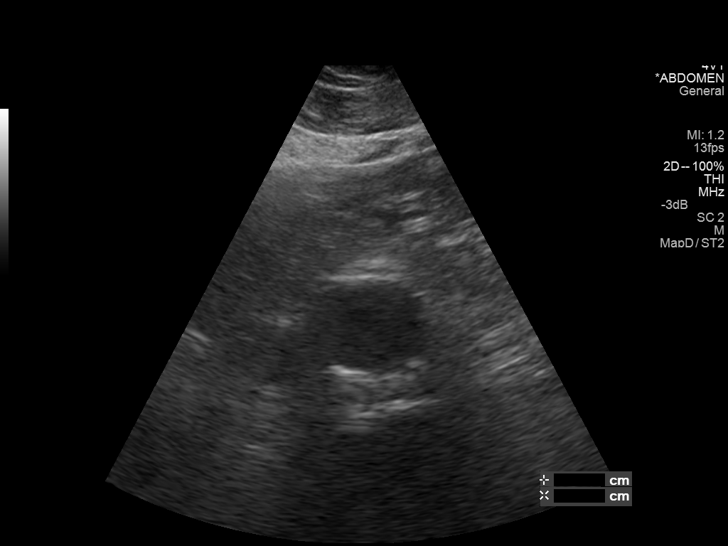
[im 12/36]
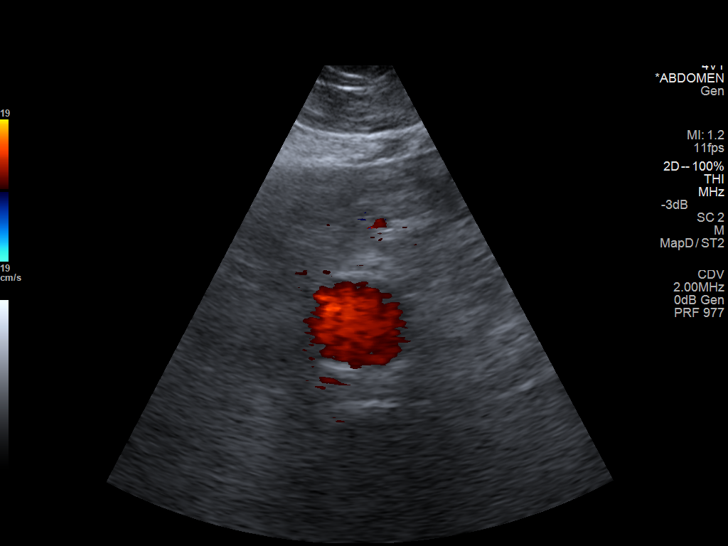
[im 14/36]
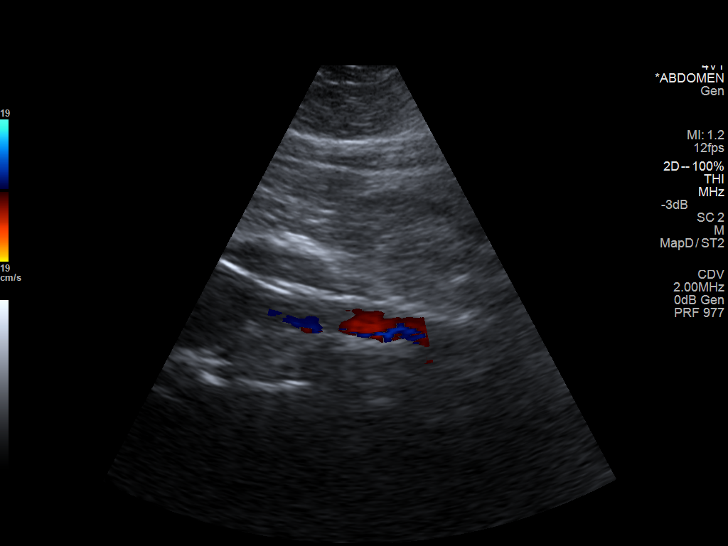
[im 17/36]
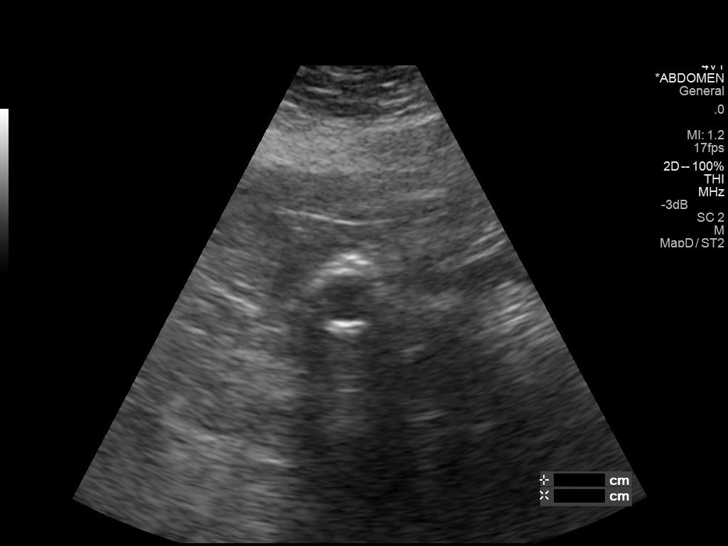
[im 19/36]
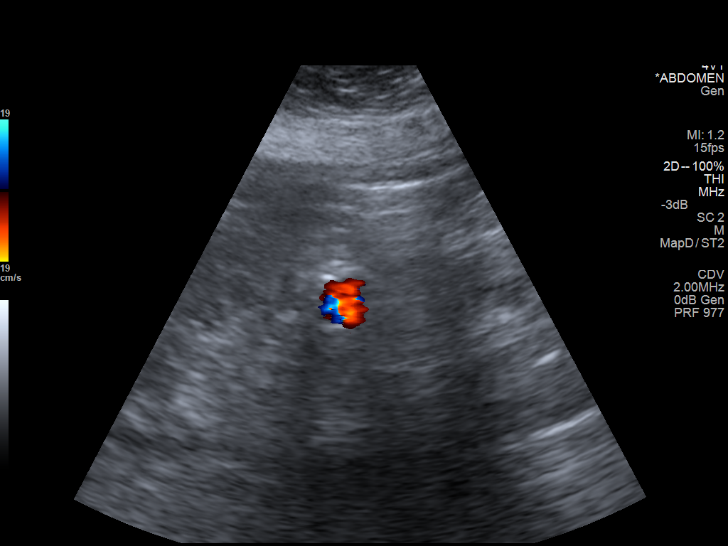
[im 22/36]
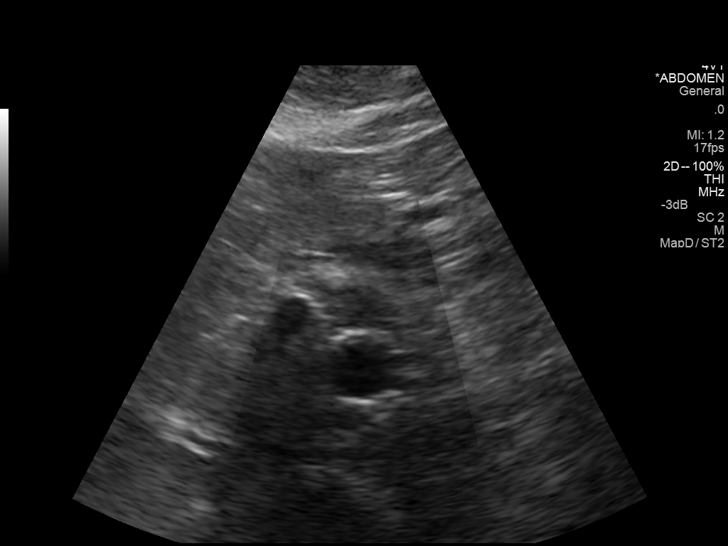
[im 24/36]
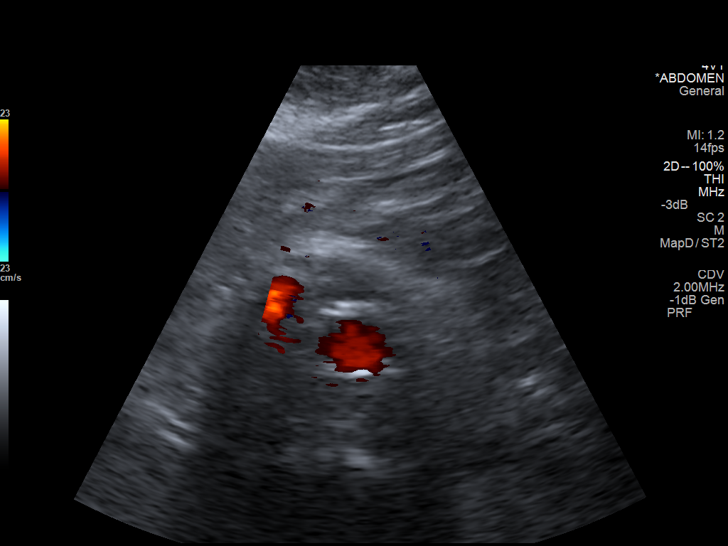
[im 27/36]
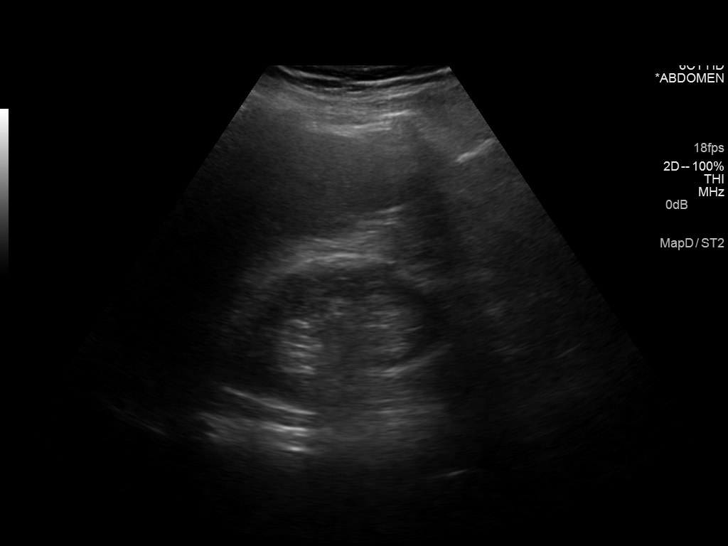
[im 30/36]
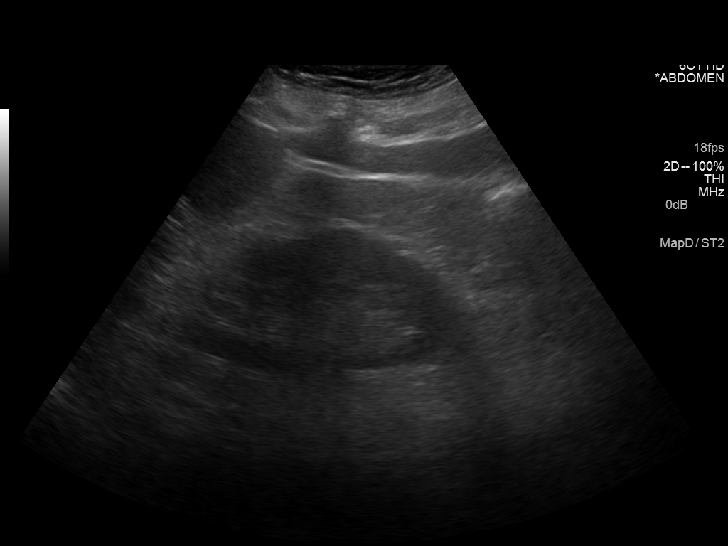
[im 33/36]
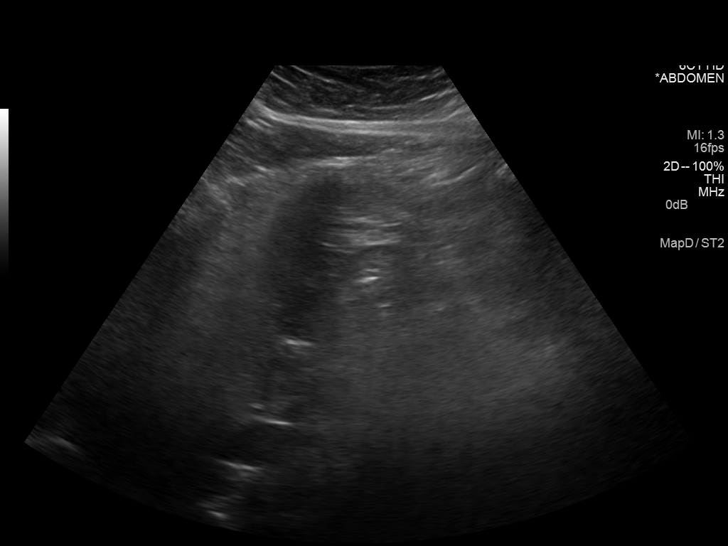
[im 36/36]
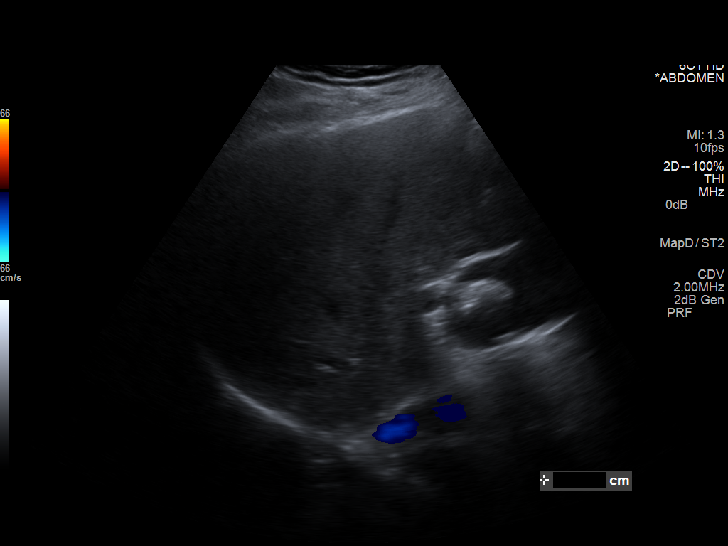

[14 of 25 positions shown; findings below may reference images not displayed]

FINDINGS: Abdominal Aorta

Fusiform dilatation of the mid abdominal aorta, measuring 3.5 x
cm. The presumed stent is best seen at the bifurcation. Both iliac
arteries appear patent.

Maximum AP

Diameter:  3.3 cm

Maximum TRV

Diameter: 3.5 cm

Other: Right kidney measures 8.9 cm in length and left kidney
measures 9.8 cm in length. A 2.5 cm gallstone was incidentally seen.
IMPRESSION: 1. There is fusiform dilatation of the mid abdominal aorta measuring
up to 3.5 x 3.3 cm. By report there has been prior aneurysm repair,
I am uncertain if this dilated segment of the aorta is above the
aneurysm repair because the stent is difficult to visualize. CT
could be utilized for further characterization, if clinically
warranted.
2. 2.5 cm gallstone incidentally seen.

## 2015-08-08 ENCOUNTER — Encounter: Payer: Self-pay | Admitting: Surgery

## 2015-08-11 IMAGING — CR DG SHOULDER 3+V*R*
1 series · 2 of 2 positions shown · non-contrast
Comparison: None.

CLINICAL DATA: Right shoulder pain.

EXAM:
DG SHOULDER 3+ VIEWS RIGHT

[Series 1: grashey · 0.17mm/px · 2 of 2 slices shown]
[im 1/2]
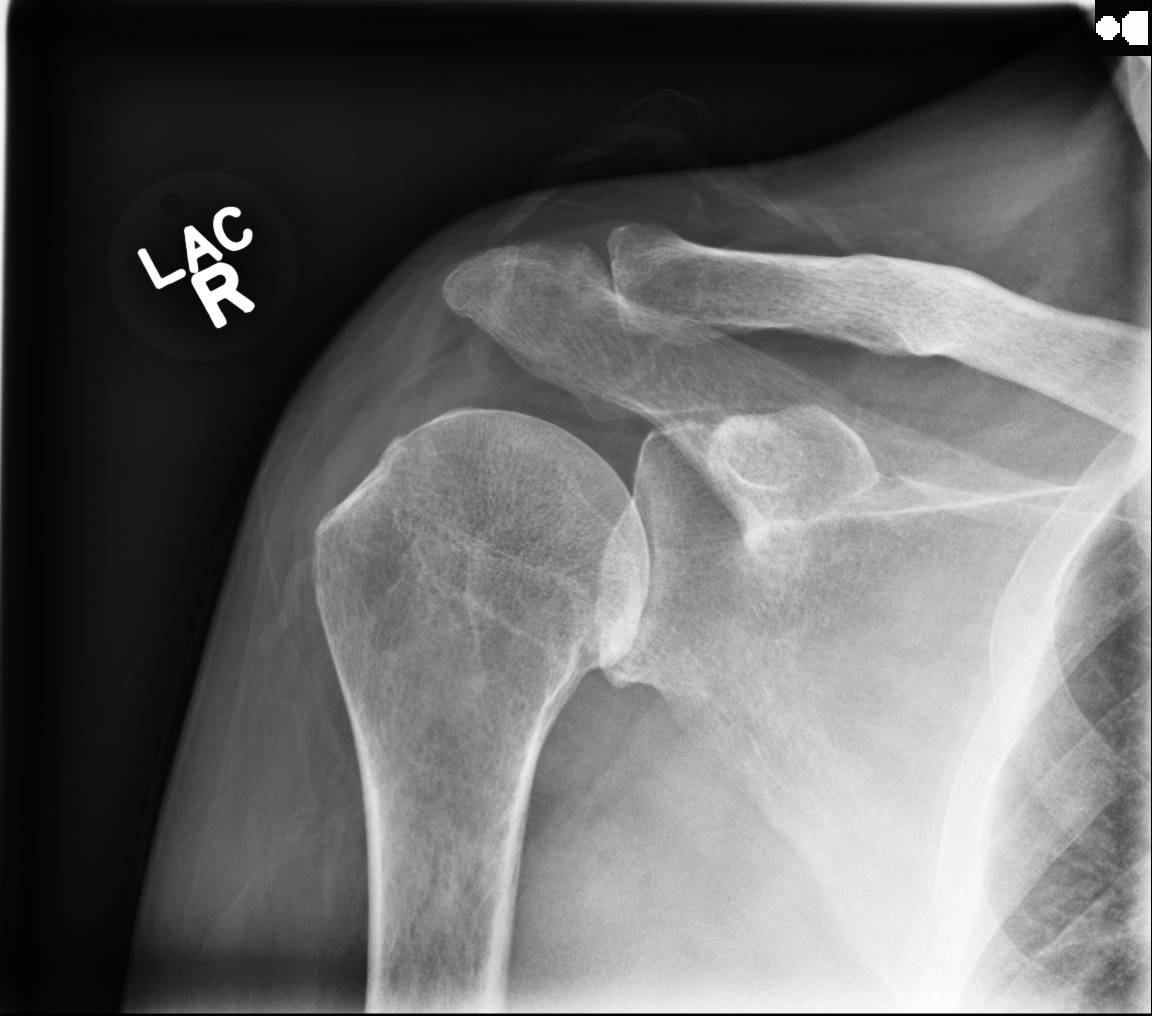
[im 2/2]
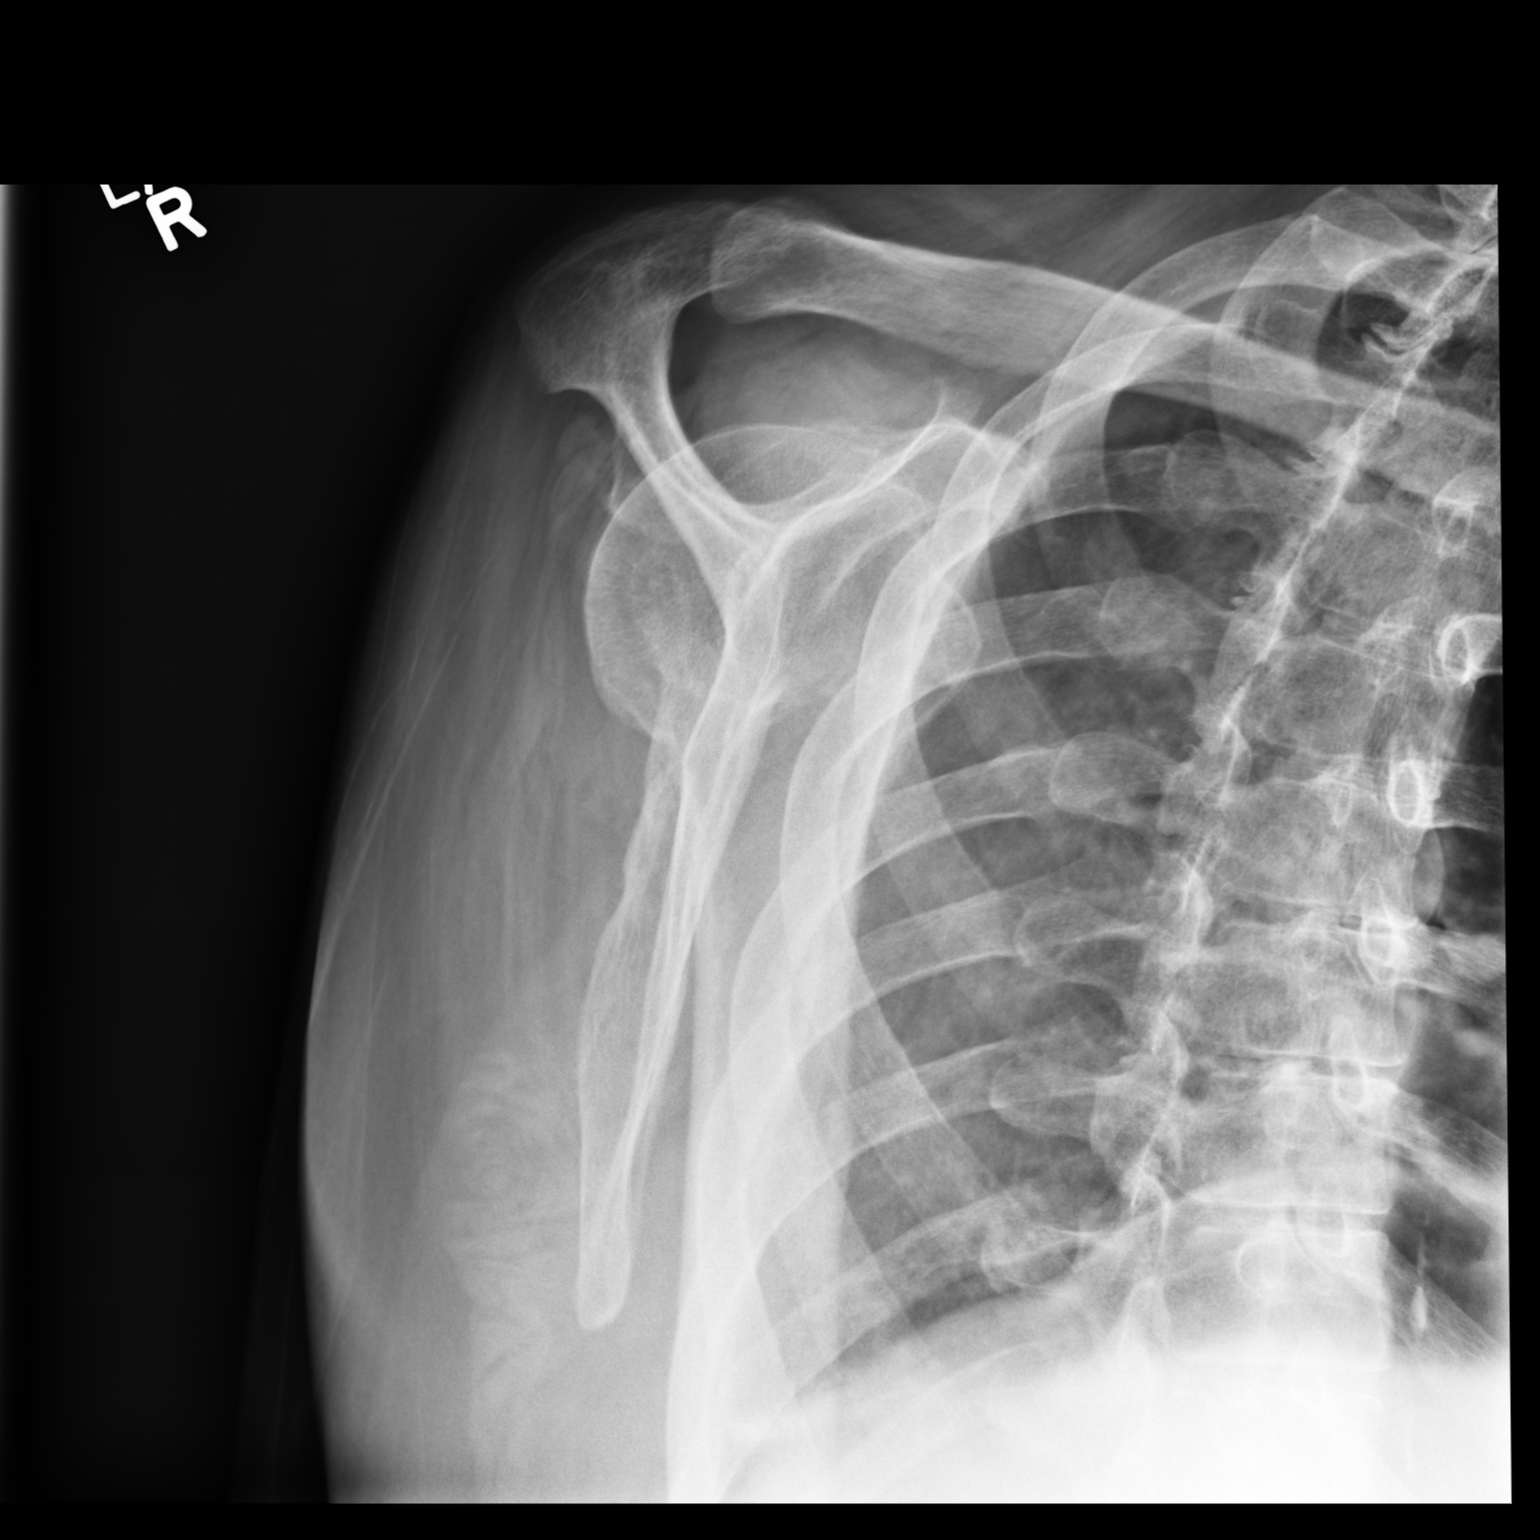

[2 of 2 positions shown; findings below may reference images not displayed]

FINDINGS: There is no evidence of fracture or dislocation. Visualized ribs
appear normal. Narrowing of the right glenohumeral joint is noted.
Soft tissues are unremarkable.
IMPRESSION: Right glenohumeral degenerative joint disease. No acute abnormality
seen in the right shoulder.

## 2015-08-13 ENCOUNTER — Other Ambulatory Visit
Admission: RE | Admit: 2015-08-13 | Discharge: 2015-08-13 | Disposition: A | Discharge status: Home / Self Care | Payer: Medicare (Managed Care) | Source: Ambulatory Visit | Attending: Family Medicine | Admitting: Family Medicine

## 2015-08-13 DIAGNOSIS — I10 Essential (primary) hypertension: Secondary | ICD-10-CM | POA: Diagnosis not present

## 2015-08-13 DIAGNOSIS — D649 Anemia, unspecified: Secondary | ICD-10-CM | POA: Insufficient documentation

## 2015-08-13 LAB — COMPREHENSIVE METABOLIC PANEL
ALT: 13 U/L — AB (ref 17–63)
AST: 16 U/L (ref 15–41)
Albumin: 3 g/dL — ABNORMAL LOW (ref 3.5–5.0)
Alkaline Phosphatase: 52 U/L (ref 38–126)
Anion gap: 7 (ref 5–15)
BILIRUBIN TOTAL: 0.9 mg/dL (ref 0.3–1.2)
BUN: 32 mg/dL — ABNORMAL HIGH (ref 6–20)
CALCIUM: 8.6 mg/dL — AB (ref 8.9–10.3)
CHLORIDE: 92 mmol/L — AB (ref 101–111)
CO2: 38 mmol/L — ABNORMAL HIGH (ref 22–32)
CREATININE: 1.09 mg/dL (ref 0.61–1.24)
Glucose, Bld: 120 mg/dL — ABNORMAL HIGH (ref 65–99)
Potassium: 4.8 mmol/L (ref 3.5–5.1)
Sodium: 137 mmol/L (ref 135–145)
TOTAL PROTEIN: 5.6 g/dL — AB (ref 6.5–8.1)

## 2015-08-13 LAB — CBC WITH DIFFERENTIAL/PLATELET
Basophils Absolute: 0 10*3/uL (ref 0–0.1)
Basophils Relative: 1 %
Eosinophils Absolute: 0.1 10*3/uL (ref 0–0.7)
HCT: 30.1 % — ABNORMAL LOW (ref 40.0–52.0)
Hemoglobin: 9.8 g/dL — ABNORMAL LOW (ref 13.0–18.0)
LYMPHS ABS: 0.5 10*3/uL — AB (ref 1.0–3.6)
MCH: 30.6 pg (ref 26.0–34.0)
MCHC: 32.5 g/dL (ref 32.0–36.0)
MCV: 94.2 fL (ref 80.0–100.0)
MONO ABS: 0.6 10*3/uL (ref 0.2–1.0)
Monocytes Relative: 10 %
NEUTROS ABS: 4.9 10*3/uL (ref 1.4–6.5)
PLATELETS: 92 10*3/uL — AB (ref 150–440)
RBC: 3.2 MIL/uL — AB (ref 4.40–5.90)
RDW: 17.9 % — ABNORMAL HIGH (ref 11.5–14.5)
WBC: 6.3 10*3/uL (ref 3.8–10.6)

## 2015-08-17 IMAGING — CT CT CERVICAL SPINE WITHOUT CONTRAST
3 of 4 series · 11 of 33 positions shown, 13 images · non-contrast
Comparison: 02/28/2013

CLINICAL DATA: bilateral arm parasthesia x 6 months w/ progression,
cervical fusion at [REDACTED] 3-4 years ago

EXAM:
CT CERVICAL SPINE WITHOUT CONTRAST
TECHNIQUE: Multidetector CT imaging of the cervical spine was performed without
intravenous contrast. Multiplanar CT image reconstructions were also
generated.

[Series 6: sag bone · sagittal · 0.21mm/px · 5 of 47 slices shown, 6 images]
[im 16/47  bone]
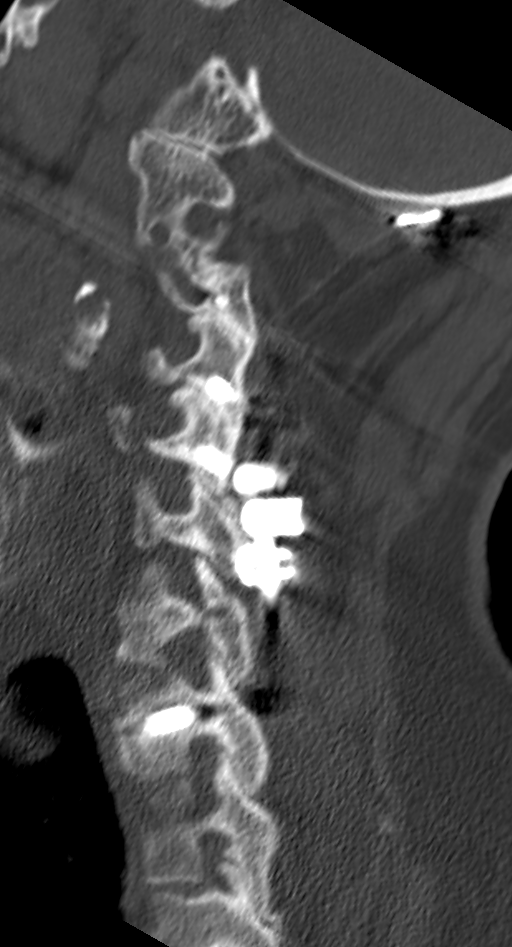
[im 20/47  bone]
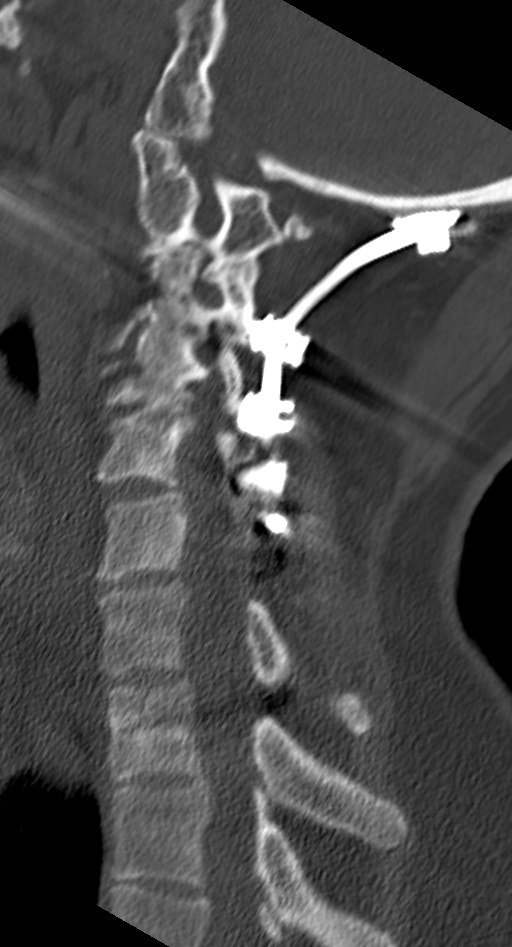
[im 24/47  soft-tissue]
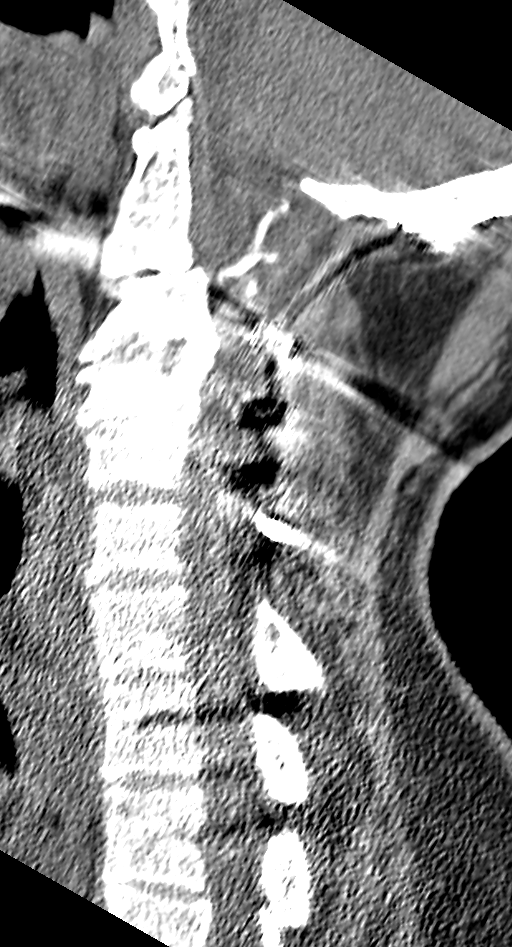
[im 24/47  bone]
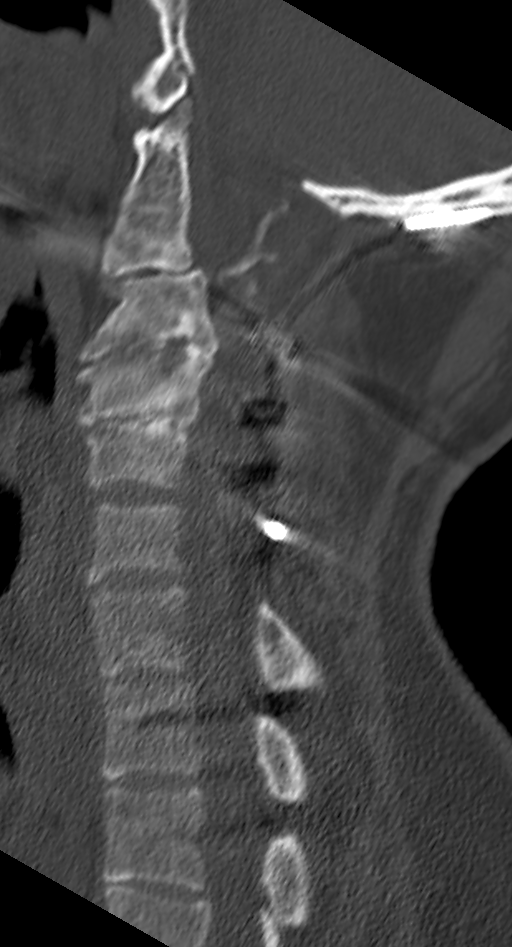
[im 27/47  bone]
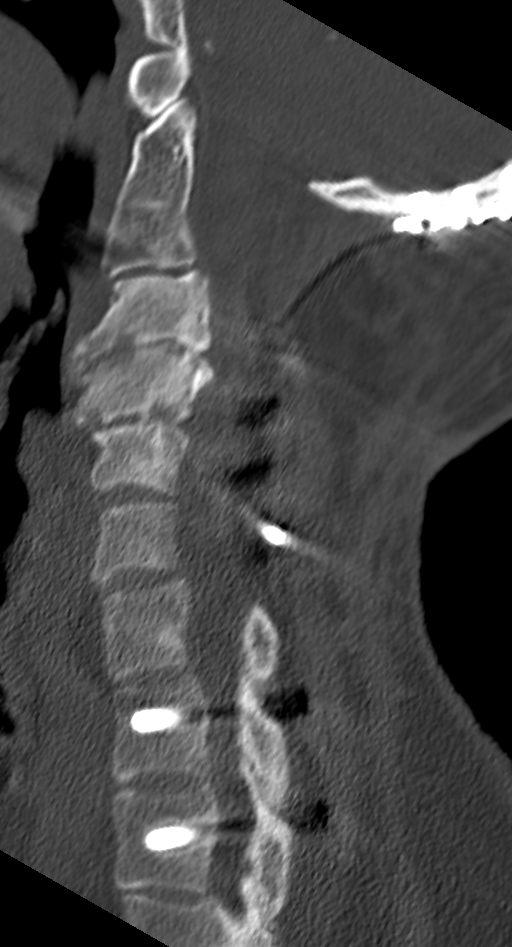
[im 31/47  bone]
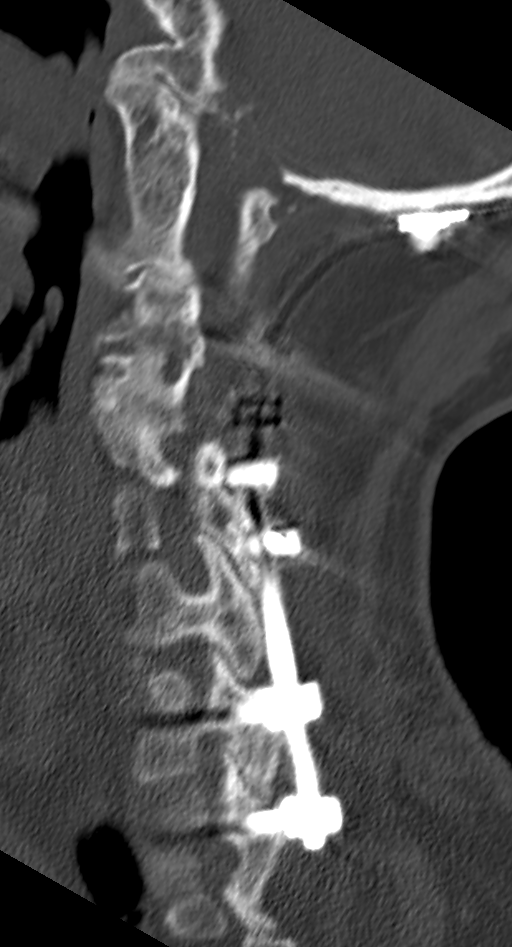

[Series 7: cor bone · coronal · 0.26mm/px · 3 of 43 slices shown]
[im 9/43  bone]
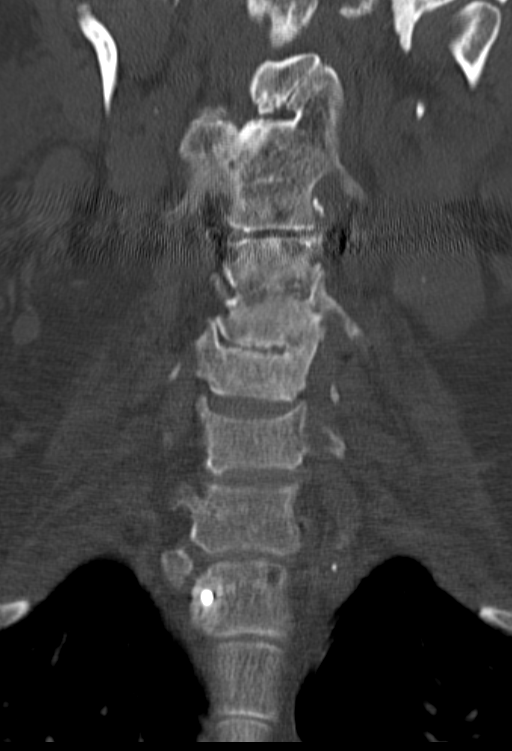
[im 17/43  bone]
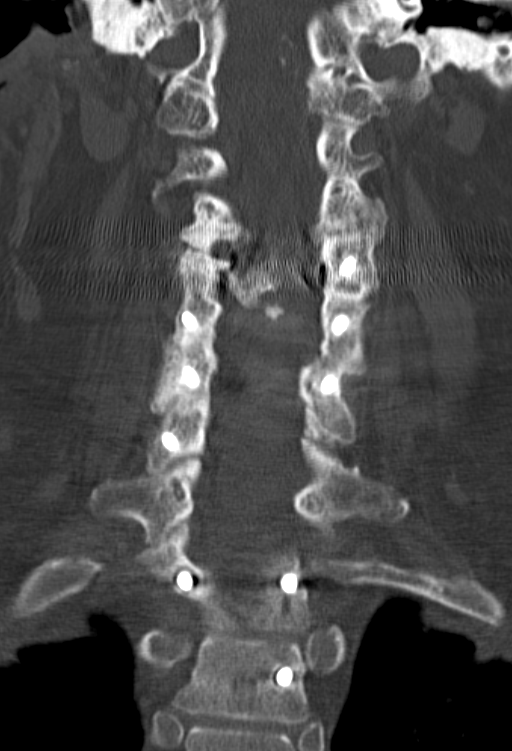
[im 26/43  bone]
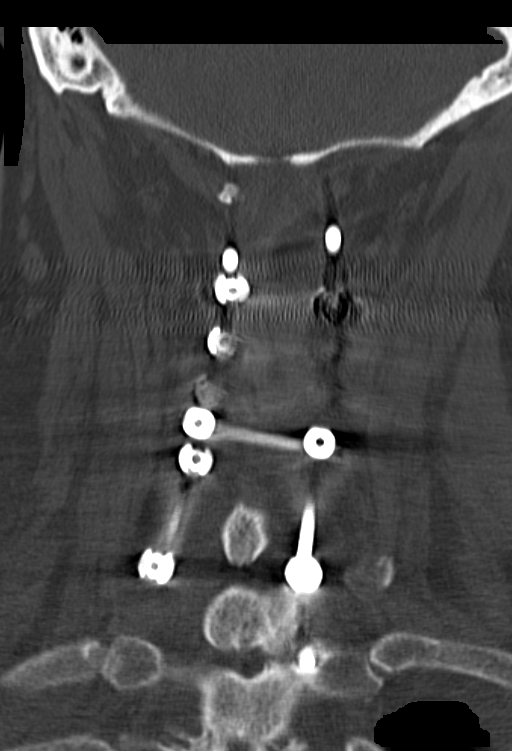

[Series 8: orthogonal axials · axial · 0.17mm/px · z∈[-287,-166]mm · 3 of 102 slices shown, 4 images]
[im 17/102  soft-tissue]
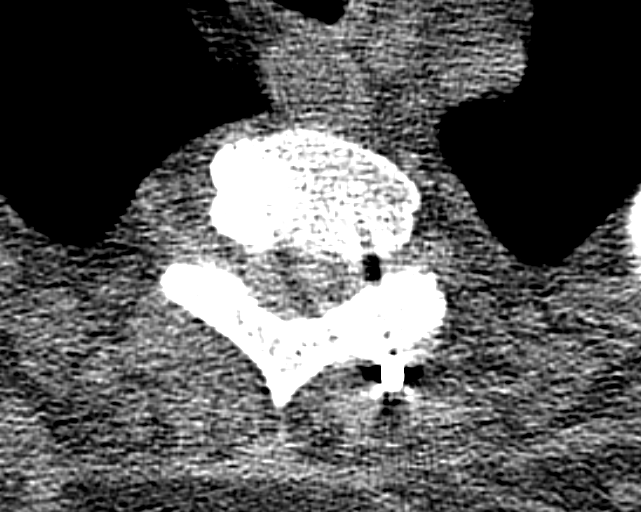
[im 17/102  bone]
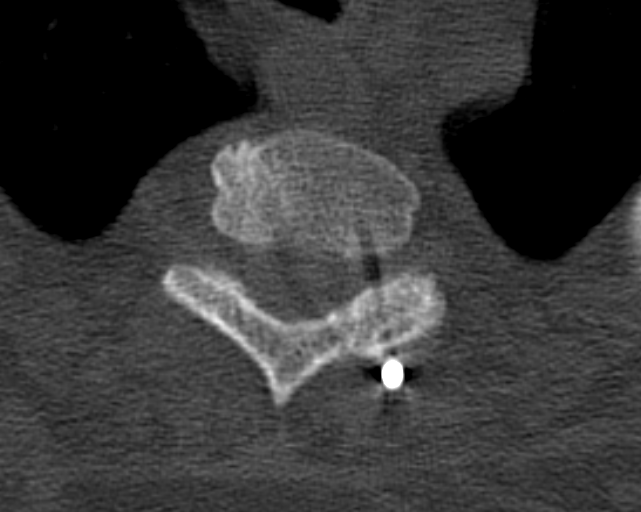
[im 51/102  bone]
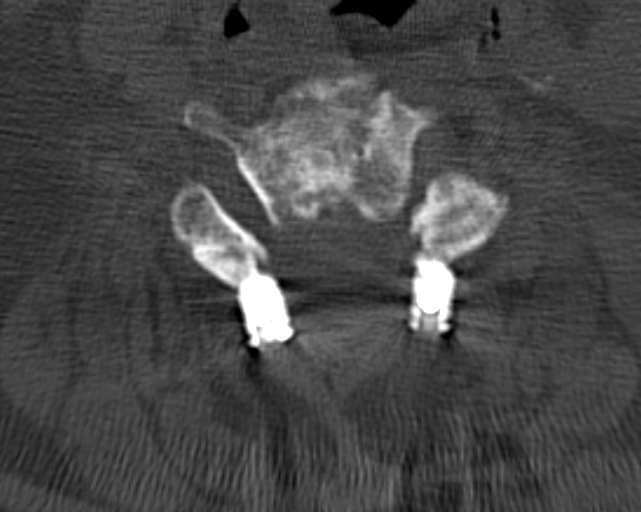
[im 85/102  bone]
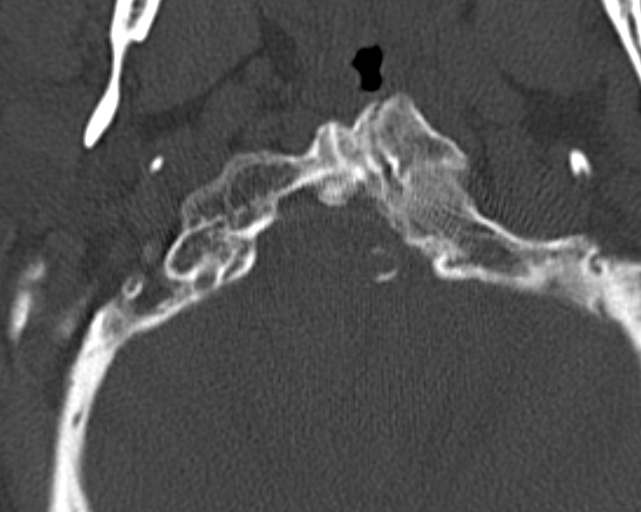

[11 of 33 positions shown; findings below may reference images not displayed]

FINDINGS: Normal alignment. No prevertebral soft tissue swelling. Posterior
fusion hardware extending from the occiput to T2, with bilateral
screws C3, C4, C5, right C6, bilaterally T1, and left T2. Lucency
around the right T1 at left T2 screws. Vertical interconnecting
hardware intact. Solid-appearing fusion across posterior elements C2
to C5, with posterior decompression down to the C6-7 interspace.
There is narrowing of interspaces C2-3, C3-4, and C4-5. Negative for
fracture. Visualized lung apices clear. Bilateral carotid
bifurcation calcified plaque.
IMPRESSION: 1. Postop changes from occiput to T2 as above, with lucency around
T1 and T2 pedicle screws.
2. Negative for fracture or other acute bone abnormality.

## 2015-08-18 ENCOUNTER — Ambulatory Visit: Payer: Self-pay | Admitting: Surgery

## 2015-08-18 ENCOUNTER — Encounter (HOSPITAL_COMMUNITY): Payer: Self-pay

## 2015-09-12 ENCOUNTER — Encounter: Payer: Self-pay | Admitting: Surgery

## 2015-09-22 ENCOUNTER — Encounter: Payer: Self-pay | Admitting: Surgery

## 2015-09-22 ENCOUNTER — Ambulatory Visit (HOSPITAL_COMMUNITY)
Admission: RE | Admit: 2015-09-22 | Discharge: 2015-09-22 | Disposition: A | Discharge status: Home / Self Care | Payer: Medicare Other | Source: Ambulatory Visit | Attending: Surgery | Admitting: Surgery

## 2015-09-22 ENCOUNTER — Ambulatory Visit (INDEPENDENT_AMBULATORY_CARE_PROVIDER_SITE_OTHER): Payer: Medicare Other | Admitting: Surgery

## 2015-09-22 VITALS — BP 90/52 | HR 56 | Temp 97.4°F | Resp 18 | Ht 70.5 in | Wt 230.0 lb

## 2015-09-22 DIAGNOSIS — I714 Abdominal aortic aneurysm, without rupture, unspecified: Secondary | ICD-10-CM

## 2015-09-22 DIAGNOSIS — I6523 Occlusion and stenosis of bilateral carotid arteries: Secondary | ICD-10-CM

## 2015-09-22 DIAGNOSIS — I10 Essential (primary) hypertension: Secondary | ICD-10-CM | POA: Diagnosis not present

## 2015-09-22 DIAGNOSIS — Z4889 Encounter for other specified surgical aftercare: Secondary | ICD-10-CM | POA: Diagnosis not present

## 2015-09-22 NOTE — Progress Notes (Signed)
Patient name: Chad Huffman MRN: [id number] DOB: [birth year] Sex: male     Chief Complaint  Patient presents with  . Carotid    6 month follow up     HISTORY OF PRESENT ILLNESS: The patient is here for follow-up. He is status post endovascular repair of his abdominal aortic aneurysm and 10/11/2007. His aneurysm continues to decrease in size with the most recent diameter measurements of 3.9 cm. 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 He recently had progression of his right carotid stenosis to greater than 80%. 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 He has had spine and neck fusion. This has limited his carotid duplex visualization as he cannot turn his neck. He denies any symptoms. Specifically he denies numbness or weakness in either extremity. He denies amaurosis fugax. He denies trouble swallowing and speaking. The patient suffers from COPD secondary to smoking. He quit smoking in 2008  The patient underwent angiography in 2016 to evaluate his carotid stenosis.  I found approximate 70-80 percent stenosis.  This was associated with tortuosity within the right common carotid artery.  No intervention was performed.  He is back today for further follow-up.  He has recently been taken off his blood pressure medications because of his good pressure readings  Past Medical History  Diagnosis Date  . Antalgic gait   . Hypertension   . Lumbago   . COPD (chronic obstructive pulmonary disease) (HCC)   . Complication of anesthesia     pt has had cervical fusion-limited neck flextion  . Depression   . DJD (degenerative joint disease)     Right shoulder  . Carotid artery occlusion   . Peripheral neuropathy (HCC)   . Chronic neck and back pain     Past Surgical History  Procedure Laterality Date  . Tonsillectomy    . Carpal tunnel release      bilateral CTS  . Abdominal aortic aneurysm repair   2009    Endovascular AAA repair  . Pilonidal cyst / sinus excision    . Spine surgery  09/15/11    Scar tissue removed- back  . Spine surgery  09/15/11    Scar tissue removed- back  . Neck surgery    . Rotary cuff    . Hammer toe surgery  12/10/2011    Procedure: HAMMER TOE CORRECTION;  Surgeon: Ernestene Kiel, DPM;  Location: [facility];  Service: Podiatry;  Laterality: Right;  Right hammertoe repair second right with 2.5x42 orthopro screw   . Metatarsal osteotomy  12/10/2011    Procedure: METATARSAL OSTEOTOMY;  Surgeon: Ernestene Kiel, DPM;  Location: [facility];  Service: Podiatry;  Laterality: Right;  Right Keller arthroplasty with size 5 silicone implant right great toe; second metatarsal osteotomy with 2.0x14 screw  . Abdominal aortic aneurysm repair    . Peripheral vascular catheterization N/A 02/11/2015    Procedure: Carotid Angiography;  Surgeon: Nada Libman, MD;  Location: [facility];  Service: Cardiovascular;  Laterality: N/A;    Social History   Social History  . Marital Status: Single    Spouse Name: N/A  . Number of Children: N/A  . Years of Education: N/A   Occupational History  . Not on file.   Social History Main Topics  . Smoking status: Former Smoker    Quit date: 08/30/2006  . Smokeless tobacco: Never Used  . Alcohol Use:  1.2 oz/week    2 Cans of beer per week     Comment: occasional use  . Drug Use: No  . Sexual Activity: Not on file   Other Topics Concern  . Not on file   Social History Narrative    Family History  Problem Relation Age of Onset  . Cancer Mother     Stomach  . Hypertension Brother   . Heart attack Father     Allergies as of 09/22/2015 - Review Complete 09/22/2015  Allergen Reaction Noted  . Ampicillin Swelling and Other (See Comments) 08/30/2011  . Nortriptyline Other (See Comments) 09/22/2015  . Brimonidine Other (See Comments) 08/04/2014  . Cyclobenzaprine Other (See Comments) 12/03/2011  .  Tizanidine Other (See Comments) 11/26/2014  . Gabapentin Rash 06/25/2013    Current Outpatient Prescriptions on File Prior to Visit  Medication Sig Dispense Refill  . ALPRAZolam (XANAX) 0.5 MG tablet Take 0.25-0.5 mg by mouth every 8 (eight) hours as needed for anxiety.     Marland Kitchen aspirin EC 81 MG tablet Take 81 mg by mouth daily.    . budesonide-formoterol (SYMBICORT) 160-4.5 MCG/ACT inhaler Inhale 2 puffs into the lungs 2 (two) times daily.    Marland Kitchen etodolac (LODINE) 500 MG tablet Take 500 mg by mouth 2 (two) times daily.    . furosemide (LASIX) 40 MG tablet Take 1 tablet (40 mg total) by mouth 2 (two) times daily. 60 tablet 0  . Ipratropium-Albuterol (COMBIVENT RESPIMAT) 20-100 MCG/ACT AERS respimat Inhale 1 puff into the lungs every 6 (six) hours. 1 Inhaler 0  . ipratropium-albuterol (DUONEB) 0.5-2.5 (3) MG/3ML SOLN Take 3 mLs by nebulization every 6 (six) hours. (Patient taking differently: Take 3 mLs by nebulization every 6 (six) hours as needed (for wheezing/shortness of breath). ) 360 mL 0  . levothyroxine (SYNTHROID, LEVOTHROID) 125 MCG tablet Take 1 tablet (125 mcg total) by mouth daily before breakfast. 30 tablet 0  . lisinopril (PRINIVIL,ZESTRIL) 10 MG tablet Take 10 mg by mouth daily.    . mirtazapine (REMERON) 15 MG tablet Take 15 mg by mouth at bedtime.    . Multiple Vitamin (MULTIVITAMIN WITH MINERALS) TABS tablet Take 1 tablet by mouth daily.    Marland Kitchen omeprazole (PRILOSEC) 20 MG capsule Take 20 mg by mouth daily.    Marland Kitchen oxyCODONE (ROXICODONE) 5 MG immediate release tablet Take 1 tablet (5 mg total) by mouth every 8 (eight) hours as needed. 20 tablet 0  . sertraline (ZOLOFT) 100 MG tablet Take 100 mg by mouth daily.     No current facility-administered medications on file prior to visit.     REVIEW OF SYSTEMS: See history of present illness, otherwise all systems negative  PHYSICAL EXAMINATION:   Vital signs are  Filed Vitals:   09/22/15 1203 09/22/15 1205  BP: 106/66 90/52  Pulse:  75 56  Temp: 97.4 F (36.3 C)   TempSrc: Oral   Resp: 18   Height: 5' 10.5" (1.791 m)   Weight: 230 lb (104.327 kg)   SpO2: 94%    Body mass index is 32.52 kg/(m^2). General: The patient appears their stated age. HEENT:  No gross abnormalities Pulmonary:  Non labored breathing Abdomen: Soft and non-tender Musculoskeletal: There are no major deformities. Neurologic: No focal weakness or paresthesias are detected, Skin: There are no ulcer or rashes noted. Psychiatric: The patient has normal affect. Cardiovascular: There is a regular rate and rhythm without significant murmur appreciated.No carotid bruits   Diagnostic Studies Carotid Doppler studies were ordered  and reviewed today.  This shows 80-99 percent right carotid stenosis and less than 40% left carotid stenosis.  [facility] velocity today on the right was 316/131.  Assessment: Asymptomatic right carotid stenosis Plan: The patient's Doppler profile today is very similar to what we found prior to his arteriogram last year.  Therefore, I do not think there has been any interval change.  He remained asymptomatic.  Because of the limitations involved with treating his carotid stenosis, I have elected to continue with medical management.  I do not think he is a good surgical candidate because of how high the lesion is and because he has his neck perfused.  He is not the best carotid stenting candidate because of the tortuosity within his right carotid artery.  However, if he does need intervention, carotid stenting will be preferred I am having him follow up in 6 months with a carotid ultrasound as well as an ultrasound of his aneurysm.  Jorge NyV. Wells Brabham IV, M.D. [facility] Office: [phone] Pager:  [phone]

## 2015-10-09 NOTE — Addendum Note (Signed)
Addended by: Melodye PedMANESS-HARRISON, CHANDA C on: 10/09/2015 10:02 AM   Modules accepted: Orders

## 2015-11-02 ENCOUNTER — Emergency Department: Payer: Medicare Other

## 2015-11-02 ENCOUNTER — Emergency Department
Admission: EM | Admit: 2015-11-02 | Discharge: 2015-11-02 | Disposition: A | Discharge status: Home / Self Care | Payer: Medicare Other | Attending: Emergency Medicine | Admitting: Emergency Medicine

## 2015-11-02 ENCOUNTER — Encounter: Payer: Self-pay | Admitting: Emergency Medicine

## 2015-11-02 DIAGNOSIS — M19011 Primary osteoarthritis, right shoulder: Secondary | ICD-10-CM | POA: Diagnosis not present

## 2015-11-02 DIAGNOSIS — R0602 Shortness of breath: Secondary | ICD-10-CM | POA: Diagnosis not present

## 2015-11-02 DIAGNOSIS — I420 Dilated cardiomyopathy: Secondary | ICD-10-CM | POA: Diagnosis not present

## 2015-11-02 DIAGNOSIS — F329 Major depressive disorder, single episode, unspecified: Secondary | ICD-10-CM | POA: Diagnosis not present

## 2015-11-02 DIAGNOSIS — M1611 Unilateral primary osteoarthritis, right hip: Secondary | ICD-10-CM | POA: Diagnosis not present

## 2015-11-02 DIAGNOSIS — Z7982 Long term (current) use of aspirin: Secondary | ICD-10-CM | POA: Insufficient documentation

## 2015-11-02 DIAGNOSIS — J441 Chronic obstructive pulmonary disease with (acute) exacerbation: Secondary | ICD-10-CM | POA: Diagnosis not present

## 2015-11-02 DIAGNOSIS — Z791 Long term (current) use of non-steroidal anti-inflammatories (NSAID): Secondary | ICD-10-CM | POA: Insufficient documentation

## 2015-11-02 DIAGNOSIS — I10 Essential (primary) hypertension: Secondary | ICD-10-CM | POA: Diagnosis not present

## 2015-11-02 DIAGNOSIS — E038 Other specified hypothyroidism: Secondary | ICD-10-CM | POA: Insufficient documentation

## 2015-11-02 DIAGNOSIS — Z79899 Other long term (current) drug therapy: Secondary | ICD-10-CM | POA: Diagnosis not present

## 2015-11-02 DIAGNOSIS — Z87891 Personal history of nicotine dependence: Secondary | ICD-10-CM | POA: Diagnosis not present

## 2015-11-02 LAB — BASIC METABOLIC PANEL
ANION GAP: 9 (ref 5–15)
BUN: 20 mg/dL (ref 6–20)
CHLORIDE: 93 mmol/L — AB (ref 101–111)
CO2: 32 mmol/L (ref 22–32)
Calcium: 9.1 mg/dL (ref 8.9–10.3)
Creatinine, Ser: 1.31 mg/dL — ABNORMAL HIGH (ref 0.61–1.24)
GFR, EST NON AFRICAN AMERICAN: 54 mL/min — AB (ref >60)
Glucose, Bld: 107 mg/dL — ABNORMAL HIGH (ref 65–99)
POTASSIUM: 3.8 mmol/L (ref 3.5–5.1)
SODIUM: 134 mmol/L — AB (ref 135–145)

## 2015-11-02 LAB — TROPONIN I

## 2015-11-02 LAB — CBC WITH DIFFERENTIAL/PLATELET
BASOS ABS: 0 10*3/uL (ref 0–0.1)
EOS ABS: 0.1 10*3/uL (ref 0–0.7)
HCT: 33.4 % — ABNORMAL LOW (ref 40.0–52.0)
HEMOGLOBIN: 11.1 g/dL — AB (ref 13.0–18.0)
LYMPHS ABS: 0.9 10*3/uL — AB (ref 1.0–3.6)
Lymphocytes Relative: 11 %
MCH: 30.5 pg (ref 26.0–34.0)
MCHC: 33.2 g/dL (ref 32.0–36.0)
MCV: 92.1 fL (ref 80.0–100.0)
Monocytes Absolute: 1.1 10*3/uL — ABNORMAL HIGH (ref 0.2–1.0)
Monocytes Relative: 14 %
Neutro Abs: 6.2 10*3/uL (ref 1.4–6.5)
PLATELETS: 156 10*3/uL (ref 150–440)
RBC: 3.63 MIL/uL — AB (ref 4.40–5.90)
RDW: 15.1 % — ABNORMAL HIGH (ref 11.5–14.5)
WBC: 8.3 10*3/uL (ref 3.8–10.6)

## 2015-11-02 MED ORDER — PREDNISONE 20 MG PO TABS: 40.0000 mg | ORAL_TABLET | Freq: Every day | ORAL | Status: DC

## 2015-11-02 MED ORDER — IPRATROPIUM-ALBUTEROL 0.5-2.5 (3) MG/3ML IN SOLN
3.0000 mL | Freq: Once | RESPIRATORY_TRACT | Status: AC
  Administered 2015-11-02: 3 mL via RESPIRATORY_TRACT
  Filled 2015-11-02: qty 3

## 2015-11-02 MED ORDER — ONDANSETRON 4 MG PO TBDP
ORAL_TABLET | ORAL | Status: AC
  Administered 2015-11-02: 08:00:00
  Filled 2015-11-02: qty 1

## 2015-11-02 MED ORDER — METHYLPREDNISOLONE SODIUM SUCC 125 MG IJ SOLR
125.0000 mg | Freq: Once | INTRAMUSCULAR | Status: AC
  Administered 2015-11-02: 125 mg via INTRAVENOUS
  Filled 2015-11-02: qty 2

## 2015-11-02 NOTE — ED Notes (Signed)
Patient transported to X-ray 

## 2015-11-02 NOTE — ED Provider Notes (Signed)
[facility] Emergency Department Provider Note    ____________________________________________  Time seen: On EMS arrival  I have reviewed the triage vital signs and the nursing notes.   HISTORY  Chief Complaint Shortness of Breath and Fall   History limited by: Not Limited   HPI Chad Huffman is a 70 y.o. male with history of COPD who presents to the emergency department today via EMS because of concerns for shortness of breath. Patient states that he has COPD and is chronically on 4 L of oxygen. He states last night he started developing more shortness of breath. He did bump his oxygen up to 5 L. He states he feels like it is coming primarily from his right side. He has had an associated productive cough, productive of clear phlegm. He denies any chest pain. Denies any fevers. He states that he did pass out last night. He states he scraped his knees and hit his forehead. He thinks he was only out for a second. He states he has passed out before when he has had trouble with his breathing.   Past Medical History  Diagnosis Date  . Antalgic gait   . Hypertension   . Lumbago   . COPD (chronic obstructive pulmonary disease) (HCC)   . Complication of anesthesia     pt has had cervical fusion-limited neck flextion  . Depression   . DJD (degenerative joint disease)     Right shoulder  . Carotid artery occlusion   . Peripheral neuropathy (HCC)   . Chronic neck and back pain     Patient Active Problem List   Diagnosis Date Noted  . Respiratory failure (HCC) 07/22/2015  . Cervical vertebral fusion   . History of lumbar laminectomy   . Chronic respiratory acidosis   . Depression   . Chronic neck and back pain   . Acute on chronic respiratory failure with hypercapnia (HCC)   . Acute on chronic respiratory failure with hypoxia (HCC)   . COPD exacerbation (HCC)   . HCAP (healthcare-associated pneumonia)   . Congestive dilated cardiomyopathy (HCC)   .  Syncope and collapse   . Hyponatremia   . Other specified hypothyroidism   . Acute respiratory failure with hypoxia and hypercarbia (HCC) 08/04/2014  . S/P cervical spinal fusion 08/04/2014  . S/P lumbar laminectomy 08/04/2014  . Right shoulder pain 08/04/2014  . Arthritis of right hip 08/04/2014  . Essential hypertension 08/04/2014  . Hypothyroidism 08/04/2014  . Syncope 08/04/2014  . Fall 08/04/2014  . Low back pain 08/04/2014  . Pedal edema 08/04/2014  . Occlusion and stenosis of carotid artery without mention of cerebral infarction 12/10/2013  . Aftercare following surgery of the circulatory system, NEC 12/10/2013  . Pain of right upper extremity-Right Neck-Shoulder-Back 12/10/2013  . Abdominal aneurysm without mention of rupture 12/03/2011    Past Surgical History  Procedure Laterality Date  . Tonsillectomy    . Carpal tunnel release      bilateral CTS  . Abdominal aortic aneurysm repair  2009    Endovascular AAA repair  . Pilonidal cyst / sinus excision    . Spine surgery  09/15/11    Scar tissue removed- back  . Spine surgery  09/15/11    Scar tissue removed- back  . Neck surgery    . Rotary cuff    . Hammer toe surgery  12/10/2011    Procedure: HAMMER TOE CORRECTION;  Surgeon: Ernestene KielMax Hyatt, DPM;  Location: [facility];  Service:  Podiatry;  Laterality: Right;  Right hammertoe repair second right with 2.5x42 orthopro screw   . Metatarsal osteotomy  12/10/2011    Procedure: METATARSAL OSTEOTOMY;  Surgeon: Ernestene Kiel, DPM;  Location: [facility];  Service: Podiatry;  Laterality: Right;  Right Keller arthroplasty with size 5 silicone implant right great toe; second metatarsal osteotomy with 2.0x14 screw  . Abdominal aortic aneurysm repair    . Peripheral vascular catheterization N/A 02/11/2015    Procedure: Carotid Angiography;  Surgeon: Nada Libman, MD;  Location: [facility] INVASIVE CV LAB;  Service: Cardiovascular;  Laterality: N/A;    Current  Outpatient Rx  Name  Route  Sig  Dispense  Refill  . ALPRAZolam (XANAX) 0.5 MG tablet   Oral   Take 0.25-0.5 mg by mouth every 8 (eight) hours as needed for anxiety.          Marland Kitchen aspirin EC 81 MG tablet   Oral   Take 81 mg by mouth daily.         . budesonide-formoterol (SYMBICORT) 160-4.5 MCG/ACT inhaler   Inhalation   Inhale 2 puffs into the lungs 2 (two) times daily.         Marland Kitchen etodolac (LODINE) 500 MG tablet   Oral   Take 500 mg by mouth 2 (two) times daily.         . furosemide (LASIX) 40 MG tablet   Oral   Take 1 tablet (40 mg total) by mouth 2 (two) times daily.   60 tablet   0   . Ipratropium-Albuterol (COMBIVENT RESPIMAT) 20-100 MCG/ACT AERS respimat   Inhalation   Inhale 1 puff into the lungs every 6 (six) hours.   1 Inhaler   0     For use when you are away from home and can't use  ...   . ipratropium-albuterol (DUONEB) 0.5-2.5 (3) MG/3ML SOLN   Nebulization   Take 3 mLs by nebulization every 6 (six) hours. Patient taking differently: Take 3 mLs by nebulization every 6 (six) hours as needed (for wheezing/shortness of breath).    360 mL   0   . levothyroxine (SYNTHROID, LEVOTHROID) 125 MCG tablet   Oral   Take 1 tablet (125 mcg total) by mouth daily before breakfast.   30 tablet   0   . lisinopril (PRINIVIL,ZESTRIL) 10 MG tablet   Oral   Take 10 mg by mouth daily.         . mirtazapine (REMERON) 15 MG tablet   Oral   Take 15 mg by mouth at bedtime.         . Multiple Vitamin (MULTIVITAMIN WITH MINERALS) TABS tablet   Oral   Take 1 tablet by mouth daily.         Marland Kitchen omeprazole (PRILOSEC) 20 MG capsule   Oral   Take 20 mg by mouth daily.         Marland Kitchen oxyCODONE (ROXICODONE) 5 MG immediate release tablet   Oral   Take 1 tablet (5 mg total) by mouth every 8 (eight) hours as needed.   20 tablet   0   . sertraline (ZOLOFT) 100 MG tablet   Oral   Take 100 mg by mouth daily.           Allergies Ampicillin; Nortriptyline;  Brimonidine; Cyclobenzaprine; Tizanidine; and Gabapentin  Family History  Problem Relation Age of Onset  . Cancer Mother     Stomach  . Hypertension Brother   . Heart attack Father  Social History Social History  Substance Use Topics  . Smoking status: Former Smoker    Quit date: 08/30/2006  . Smokeless tobacco: Never Used  . Alcohol Use: 1.2 oz/week    2 Cans of beer per week     Comment: occasional use    Review of Systems  Constitutional: Negative for fever. Cardiovascular: Negative for chest pain. Respiratory: Positive for shortness of breath. Gastrointestinal: Negative for abdominal pain, vomiting and diarrhea. Skin: Abrasions to bilateral knees, forehead Neurological: Negative for headaches, focal weakness or numbness.   10-point ROS otherwise negative.  ____________________________________________   PHYSICAL EXAM:  VITAL SIGNS:    103  14   160/85 mmHg  92 %    Constitutional: Alert and oriented. In mild respiratory distress Eyes: Conjunctivae are normal. PERRL. Normal extraocular movements. ENT   Head: Normocephalic, small abrasion to forehead   Nose: No congestion/rhinnorhea.   Mouth/Throat: Mucous membranes are moist.   Neck: No stridor. Hematological/Lymphatic/Immunilogical: No cervical lymphadenopathy. Cardiovascular: Normal rate, regular rhythm.  No murmurs, rubs, or gallops. Respiratory: Mildly increased respiratory effort, bilateral diffuse expiratory wheezing. Prolonged expiratory phase. No crackles or rhonchi appreciated. Gastrointestinal: Soft and nontender. No distention. Genitourinary: Deferred Musculoskeletal: Normal range of motion in all extremities. No joint effusions.  No lower extremity tenderness nor edema. Neurologic:  Normal speech and language. No gross focal neurologic deficits are appreciated.  Skin:  Skin is warm, dry and intact. No rash noted. Psychiatric: Mood and affect are normal. Speech and behavior are  normal. Patient exhibits appropriate insight and judgment.  ____________________________________________    LABS (pertinent positives/negatives)  Labs Reviewed  CBC WITH DIFFERENTIAL/PLATELET - Abnormal; Notable for the following:    RBC 3.63 (*)    Hemoglobin 11.1 (*)    HCT 33.4 (*)    RDW 15.1 (*)    Lymphs Abs 0.9 (*)    Monocytes Absolute 1.1 (*)    All other components within normal limits  BASIC METABOLIC PANEL - Abnormal; Notable for the following:    Sodium 134 (*)    Chloride 93 (*)    Glucose, Bld 107 (*)    Creatinine, Ser 1.31 (*)    GFR calc non Af Amer 54 (*)    All other components within normal limits  TROPONIN I     ____________________________________________   EKG  I, Phineas Semen, attending physician, personally viewed and interpreted this EKG  EKG Time: 0734 Rate: 85 Rhythm: normal sinus rhythm with 1st degree av block, PVC Axis: normal Intervals: qtc 468 QRS: narrow ST changes: no st elevation Impression: abnormal ekg   ____________________________________________    RADIOLOGY  CXR IMPRESSION: 1. No radiographic evidence of acute cardiopulmonary disease. 2. Mild cardiomegaly. 3. Chronic elevation of the right hemidiaphragm is unchanged. 4. Atherosclerosis.  ____________________________________________   PROCEDURES  Procedure(s) performed: None  Critical Care performed: No  ____________________________________________   INITIAL IMPRESSION / ASSESSMENT AND PLAN / ED COURSE  Pertinent labs & imaging results that were available during my care of the patient were reviewed by me and considered in my medical decision making (see chart for details).  Patient presented to the emergency department today brought in by EMS because of concerns for shortness of breath. Patient does have a history of COPD. Patient does have bilateral diffuse wheezing, consistent with COPD exacerbation. Will obtain chest x-ray to evaluate for  pneumonia. Additionally will check blood work including troponin, ekg, given history of syncopal episode.  ----------------------------------------- 9:03 AM on 11/02/2015 -----------------------------------------  Patient states  he now feels better after breathing treatments. He does appear to be much more calm. Chest x-ray without acute concerning findings. Still awaiting blood work.  ----------------------------------------- 9:51 AM on 11/02/2015 -----------------------------------------  Lab work without any concerning findings. Creatinine mildly elevated but appears to be patient's baseline. Patient continues to state he feels better and does appear better. He does state he wants to go home. Think this is reasonable this time. We discussed use of home inhalers. Additionally will give patient prescription for prednisone. Discussed return precautions. ____________________________________________   FINAL CLINICAL IMPRESSION(S) / ED DIAGNOSES  Final diagnoses:  Shortness of breath     Phineas Semen, MD 11/02/15 [phone]

## 2015-11-02 NOTE — Discharge Instructions (Signed)
Please seek medical attention for any high fevers, chest pain, shortness of breath, change in behavior, persistent vomiting, bloody stool or any other new or concerning symptoms. ° ° °Shortness of Breath °Shortness of breath means you have trouble breathing. Shortness of breath needs medical care right away. °HOME CARE  °· Do not smoke. °· Avoid being around chemicals or things (paint fumes, dust) that may bother your breathing. °· Rest as needed. Slowly begin your normal activities. °· Only take medicines as told by your doctor. °· Keep all doctor visits as told. °GET HELP RIGHT AWAY IF:  °· Your shortness of breath gets worse. °· You feel lightheaded, pass out (faint), or have a cough that is not helped by medicine. °· You cough up blood. °· You have pain with breathing. °· You have pain in your chest, arms, shoulders, or belly (abdomen). °· You have a fever. °· You cannot walk up stairs or exercise the way you normally do. °· You do not get better in the time expected. °· You have a hard time doing normal activities even with rest. °· You have problems with your medicines. °· You have any new symptoms. °MAKE SURE YOU: °· Understand these instructions. °· Will watch your condition. °· Will get help right away if you are not doing well or get worse. °  °This information is not intended to replace advice given to you by your health care provider. Make sure you discuss any questions you have with your health care provider. °  °Document Released: 11/24/2007 Document Revised: 06/12/2013 Document Reviewed: 08/23/2011 °Elsevier Interactive Patient Education ©2016 [employer] ° °

## 2015-11-02 NOTE — ED Notes (Signed)
Pt verbalized understanding of discharge instructions. NAD at this time. 

## 2015-11-02 NOTE — ED Notes (Signed)
Pt arrived by EMS with c/o of SOB and also states he fell last night. MD Derrill KayGoodman at bedside and pt told him that he "passed out" and hit his right knee and forehead. Pt states he was only "out for a minute". Pt also states he is normally on 4L of O2 and had to increase it to 5L last night.

## 2015-11-26 ENCOUNTER — Ambulatory Visit (INDEPENDENT_AMBULATORY_CARE_PROVIDER_SITE_OTHER): Payer: Medicare Other | Admitting: Podiatry

## 2015-11-26 ENCOUNTER — Encounter: Payer: Self-pay | Admitting: Podiatry

## 2015-11-26 DIAGNOSIS — B351 Tinea unguium: Secondary | ICD-10-CM | POA: Diagnosis not present

## 2015-11-26 DIAGNOSIS — M79676 Pain in unspecified toe(s): Secondary | ICD-10-CM | POA: Diagnosis not present

## 2015-11-26 NOTE — Progress Notes (Signed)
He presents today chief complaint of painful elongated toenails 1 through 5 bilateral.  Objective: Vital signs are stable he is alert and oriented 3. Pulses are palpable. Toenails are long thick yellow dystrophic onychomycotic painful palpation.  Assessment: Pain and limp secondary to onychomycosis.  Plan: Debridement of toenails without iatrogenic lesions follow up with us as needed.

## 2015-12-01 IMAGING — CR DG SHOULDER 3+V*R*
1 series · 3 of 3 positions shown · non-contrast
Comparison: 12/10/2013.

CLINICAL DATA: Posterior right shoulder pain with decreased range
of motion.

EXAM:
DG SHOULDER 3+ VIEWS RIGHT

[Series 1: dxr shoulder right complete · 0.14mm/px · 3 of 3 slices shown]
[im 1/3]
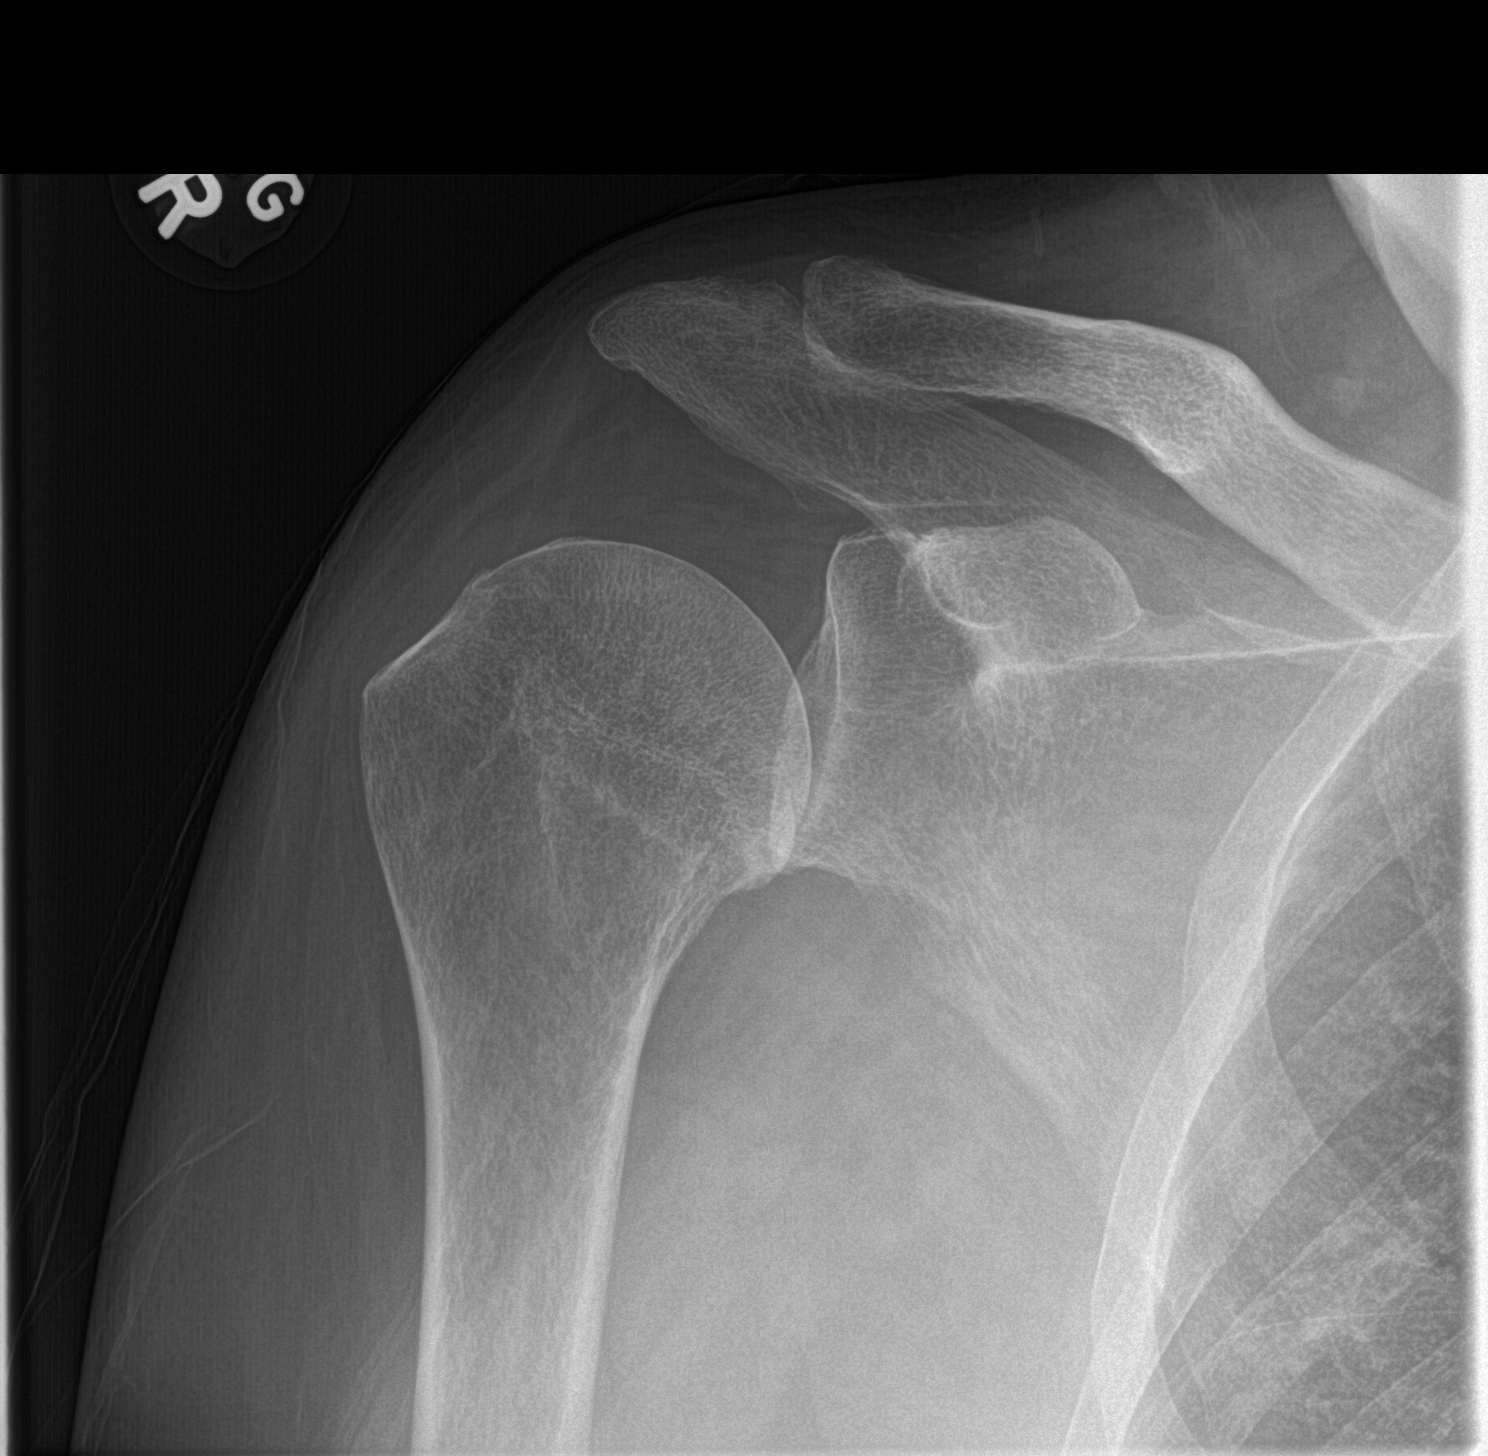
[im 2/3]
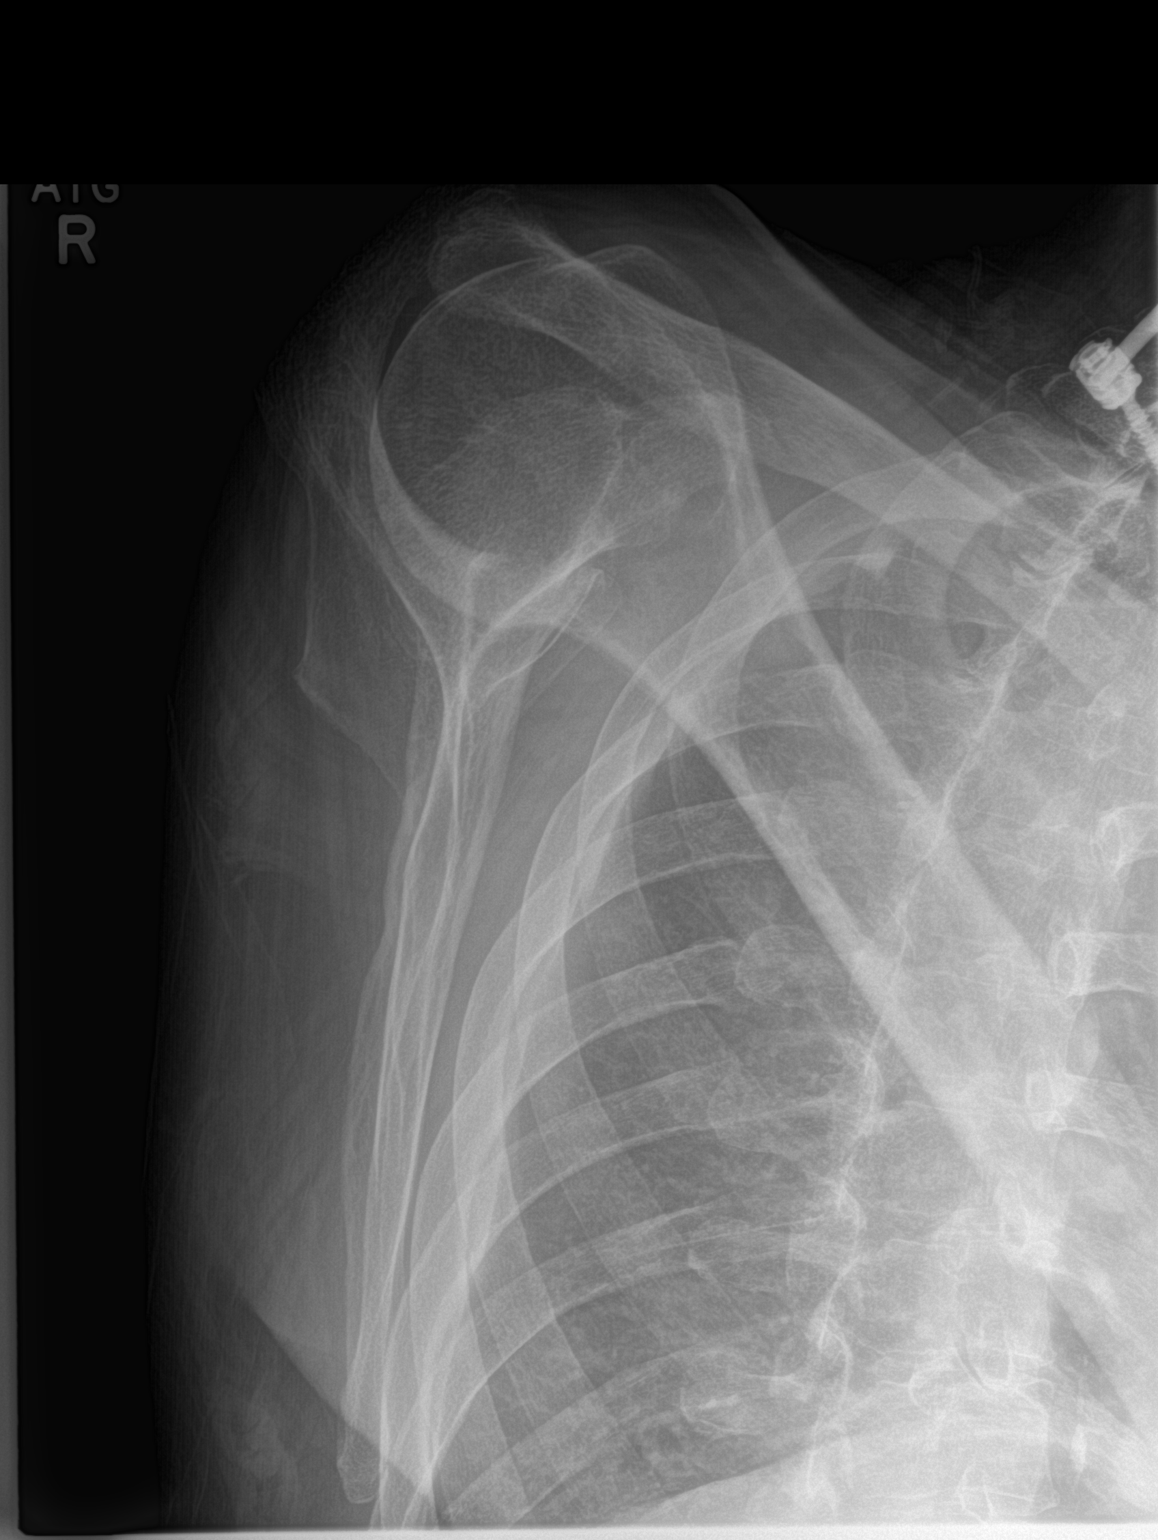
[im 3/3]
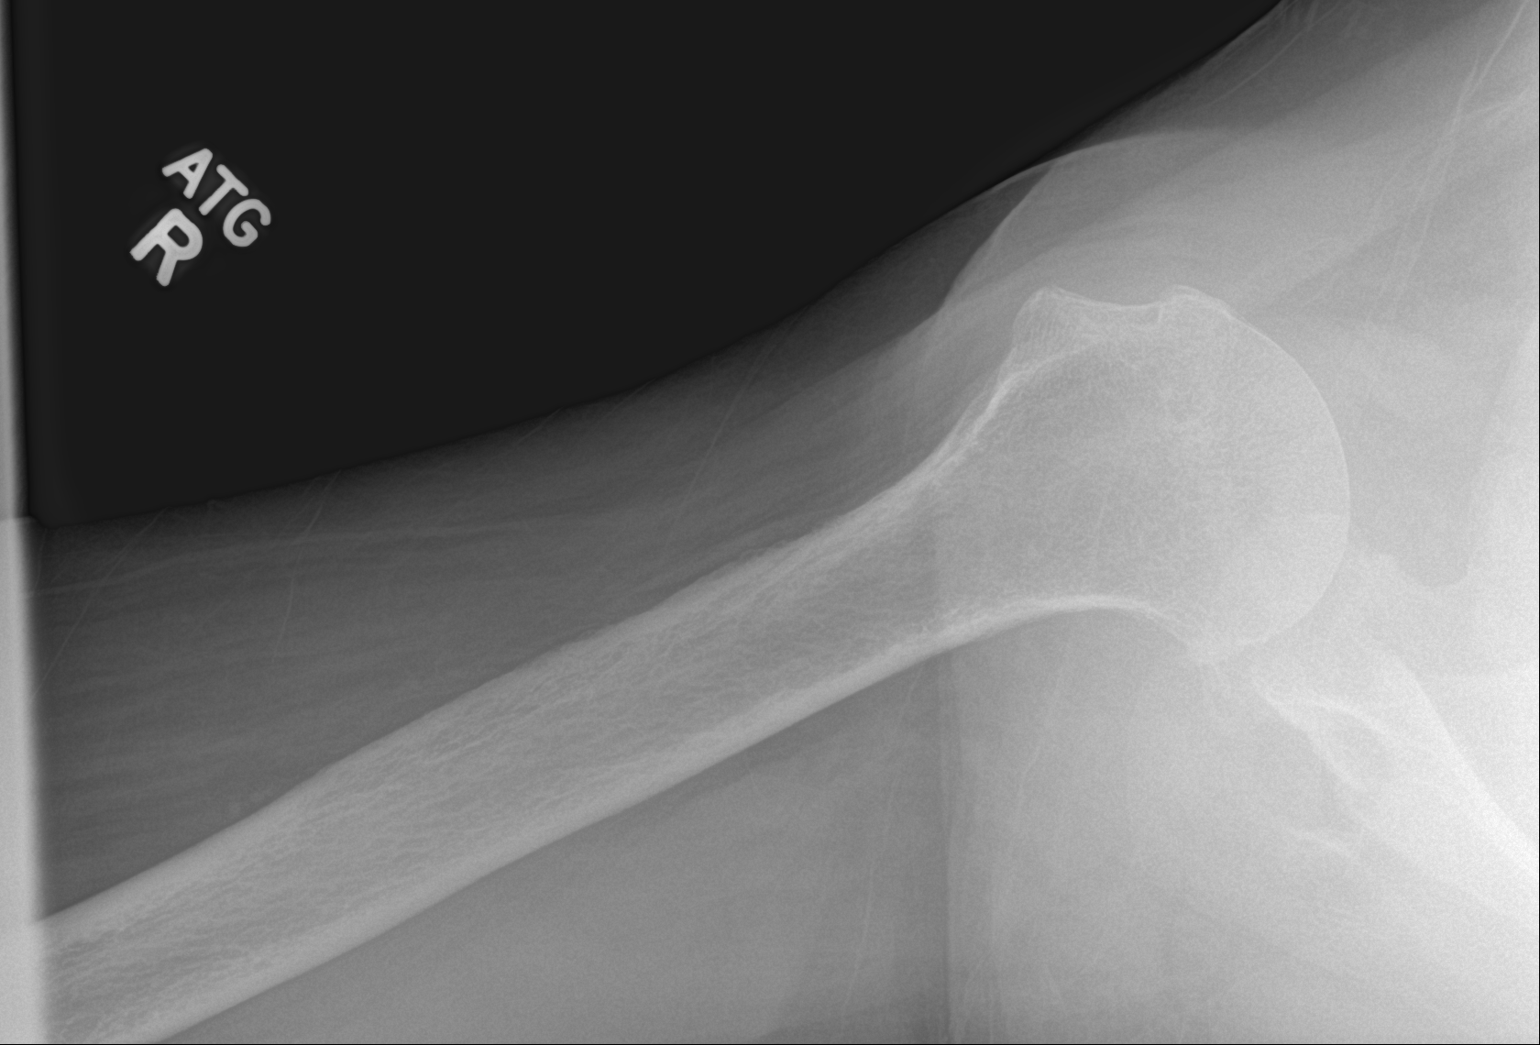

[3 of 3 positions shown; findings below may reference images not displayed]

FINDINGS: No fracture or dislocation. Mild degenerative changes are seen in
the acromioclavicular and glenohumeral joints.
IMPRESSION: Mild degenerative changes in the right acromioclavicular and
glenohumeral joints.

## 2015-12-07 IMAGING — CR DG SHOULDER 3+V*R*
1 series · 3 of 3 positions shown · non-contrast
Comparison: 04/01/2014

CLINICAL DATA: Pain posterior right shoulder since last [REDACTED],
subsequent evaluation, no acute injury

EXAM:
DG SHOULDER 3+ VIEWS RIGHT

[Series 1: w shoulder grashey right · 0.14mm/px · 3 of 3 slices shown]
[im 1/3]
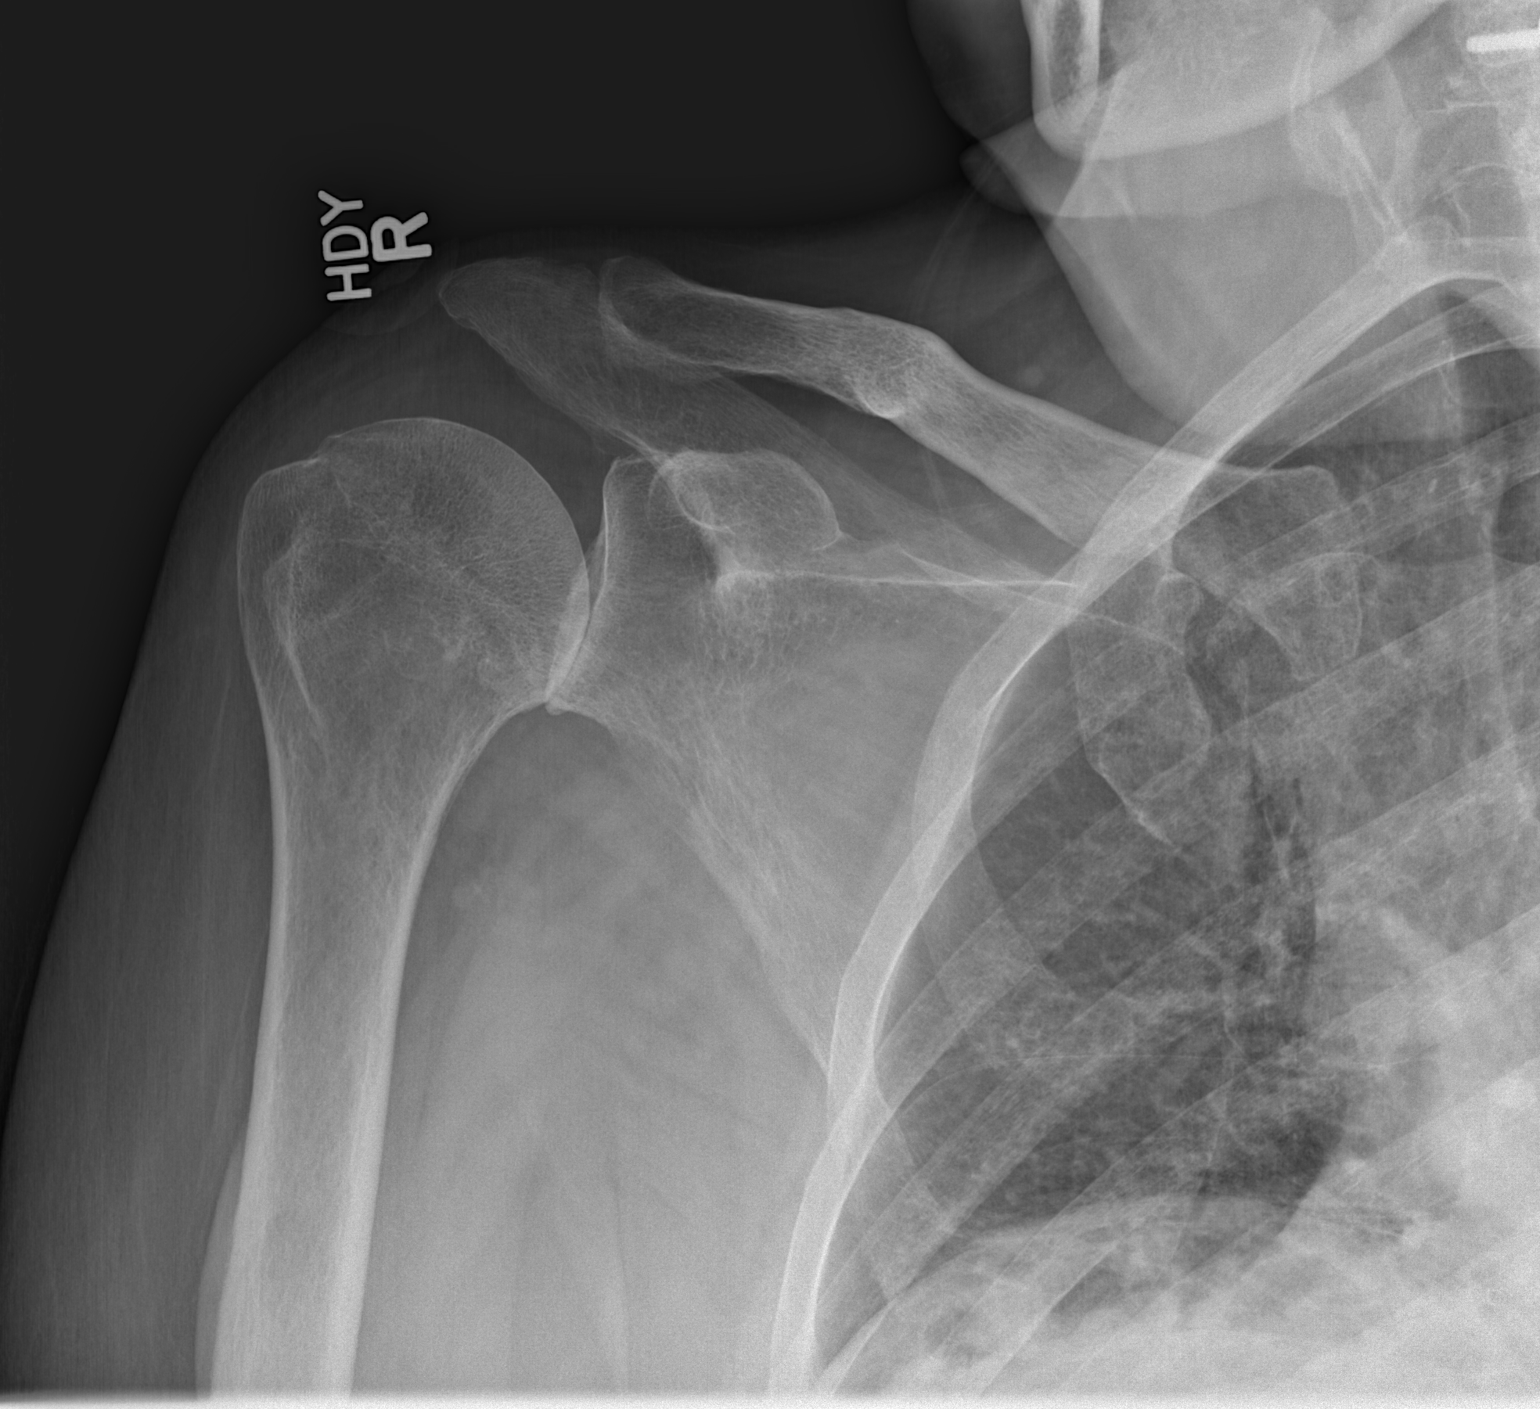
[im 2/3]
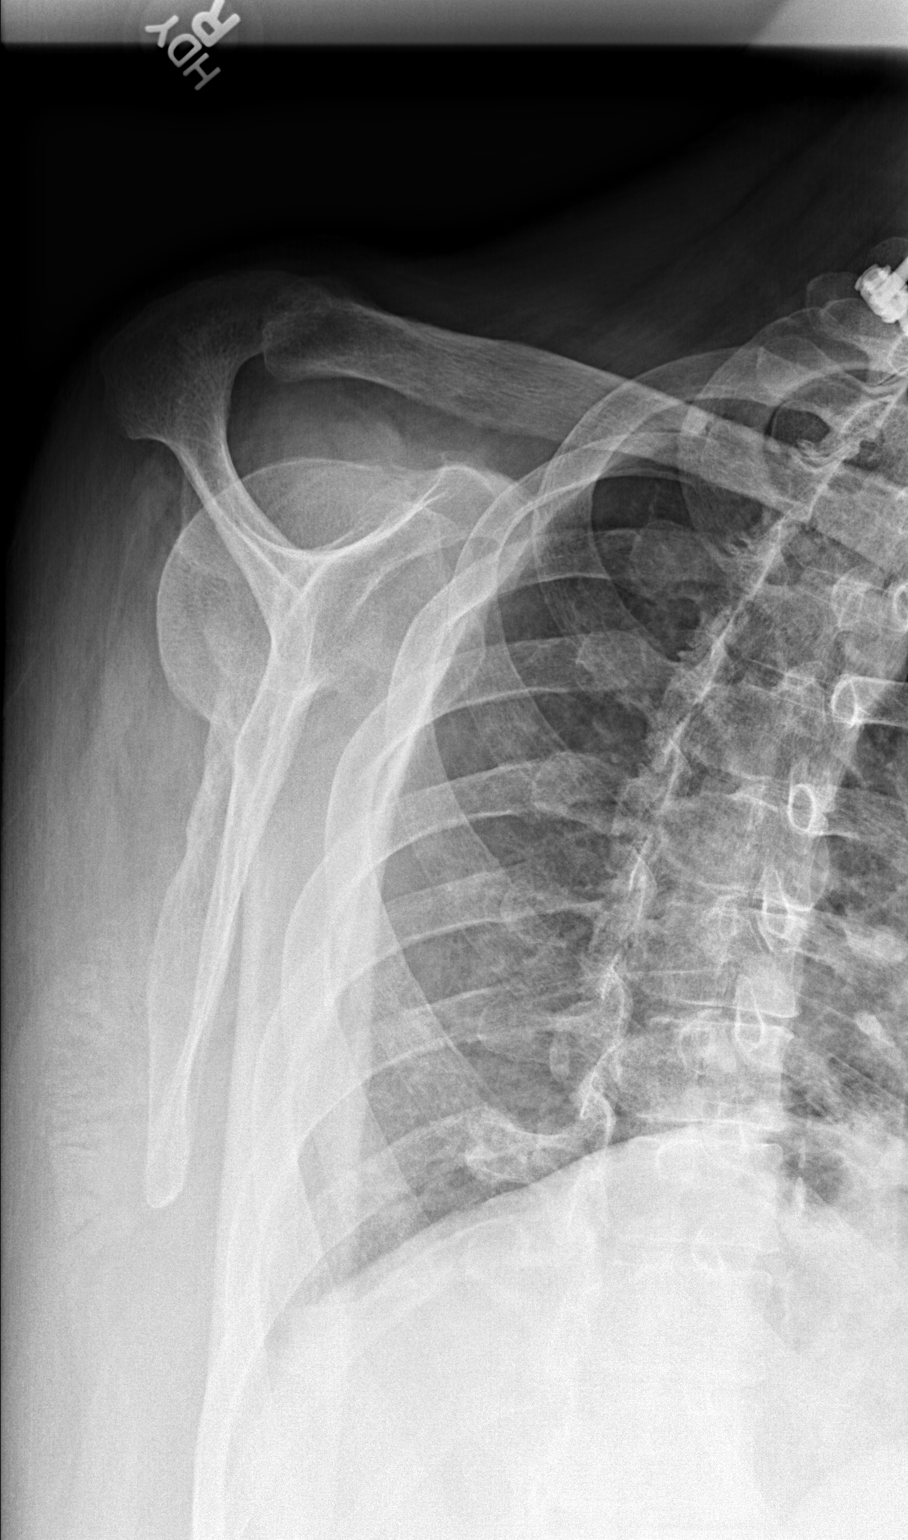
[im 3/3]
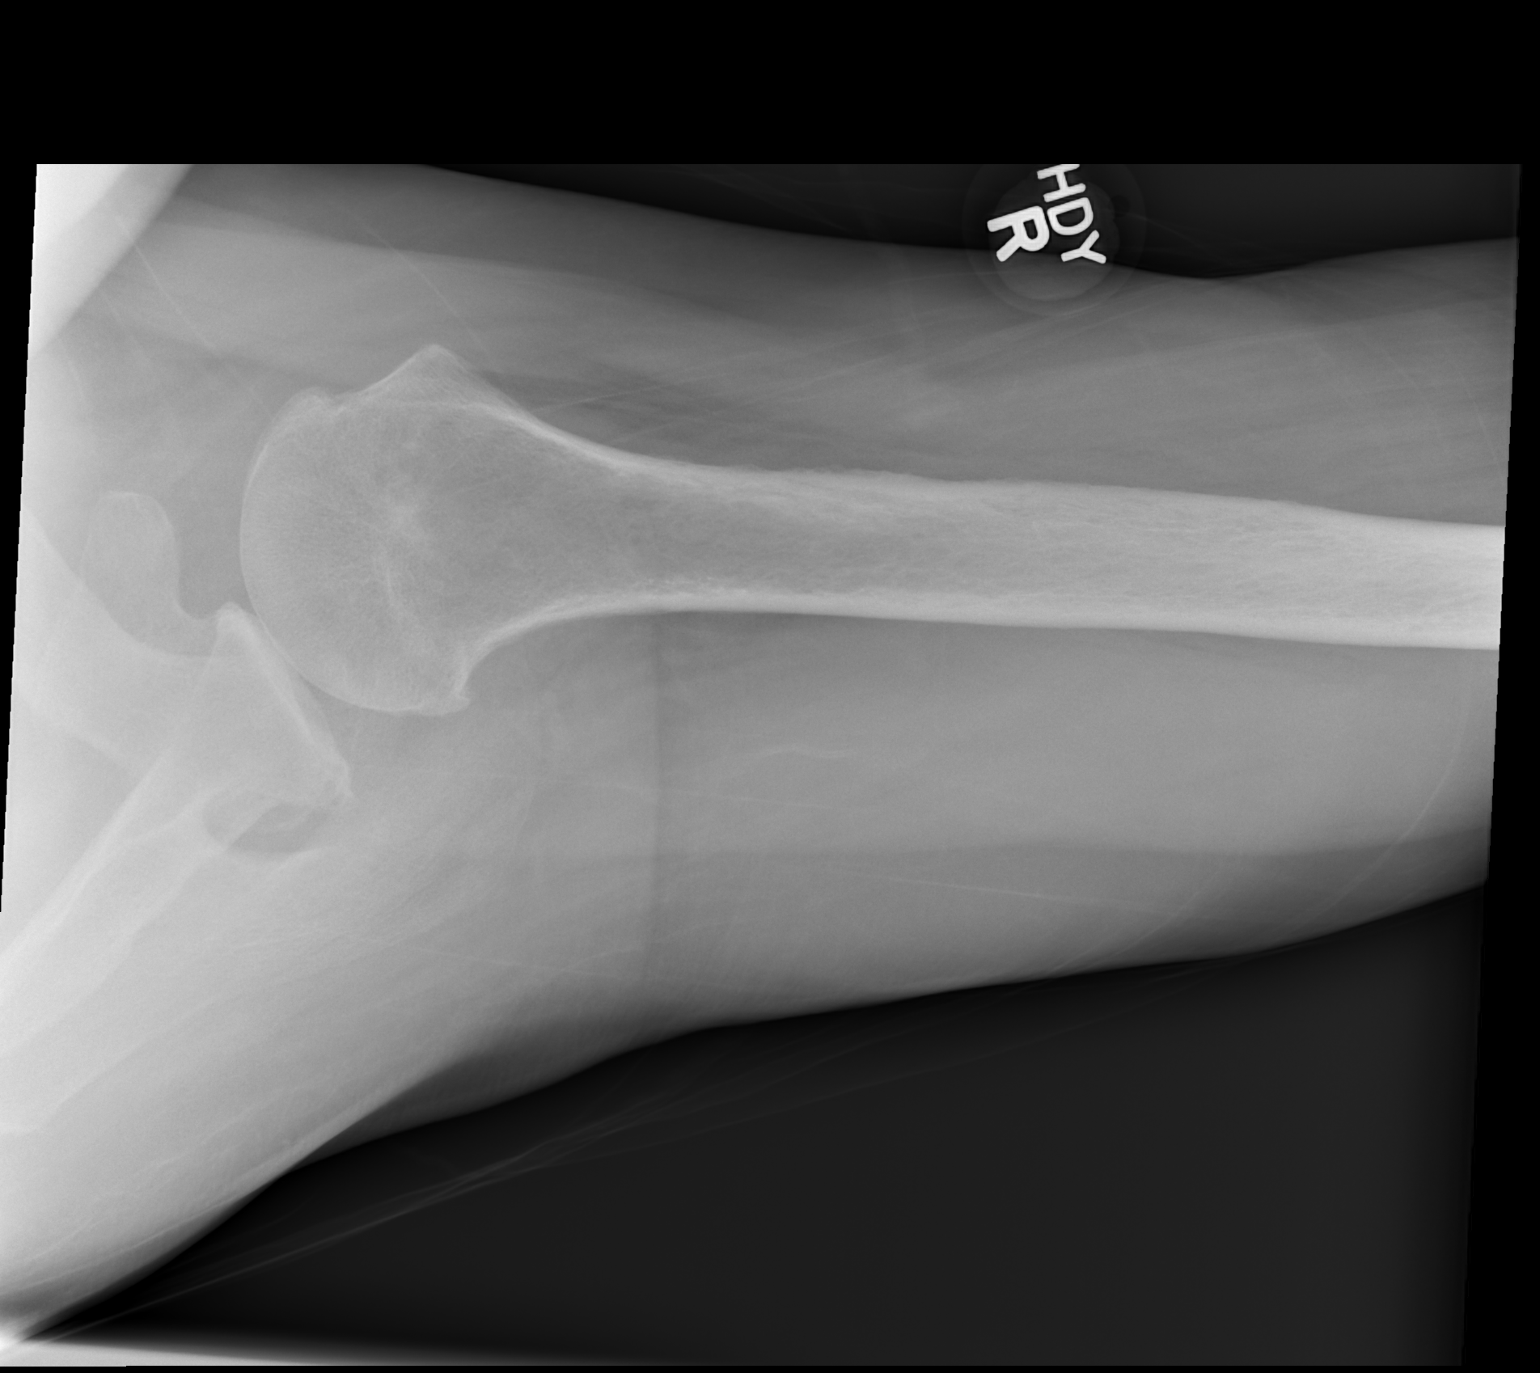

[3 of 3 positions shown; findings below may reference images not displayed]

FINDINGS: Mild to moderate narrowing and mild osteophyte formation of the
glenohumeral joint. No fracture or dislocation. Very mild
acromioclavicular hypertrophy.
IMPRESSION: Stable degenerative change

## 2015-12-11 IMAGING — CR DG THORACIC SPINE 2-3V
1 series · 3 of 3 positions shown · non-contrast
Comparison: CT cervical spine 12/16/2013.

CLINICAL DATA: Upper RIGHT back pain for 1 week.

EXAM:
THORACIC SPINE - 2 VIEW

[Series 1: dxr thoracic  ap and lateral · 0.14mm/px · 3 of 3 slices shown]
[im 1/3]
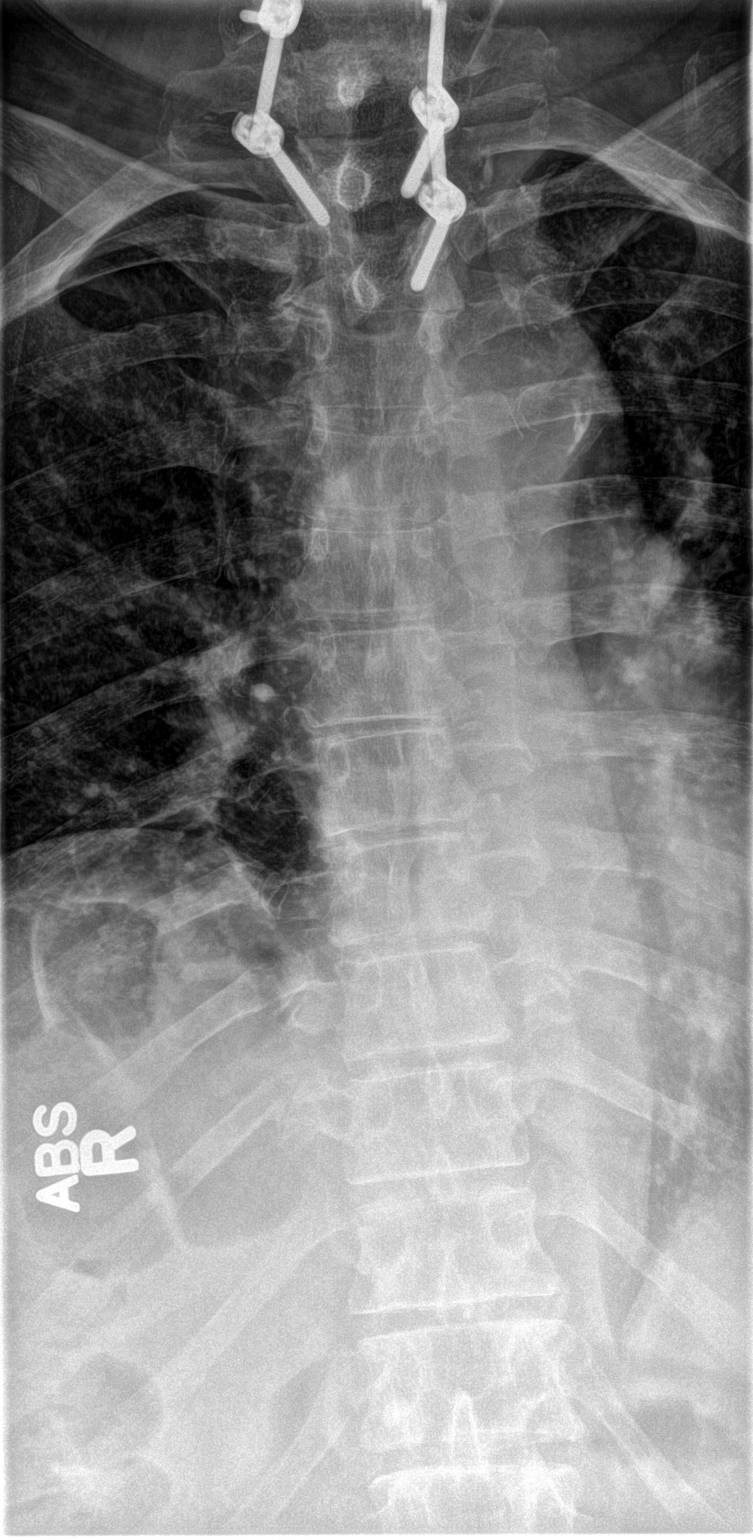
[im 2/3]
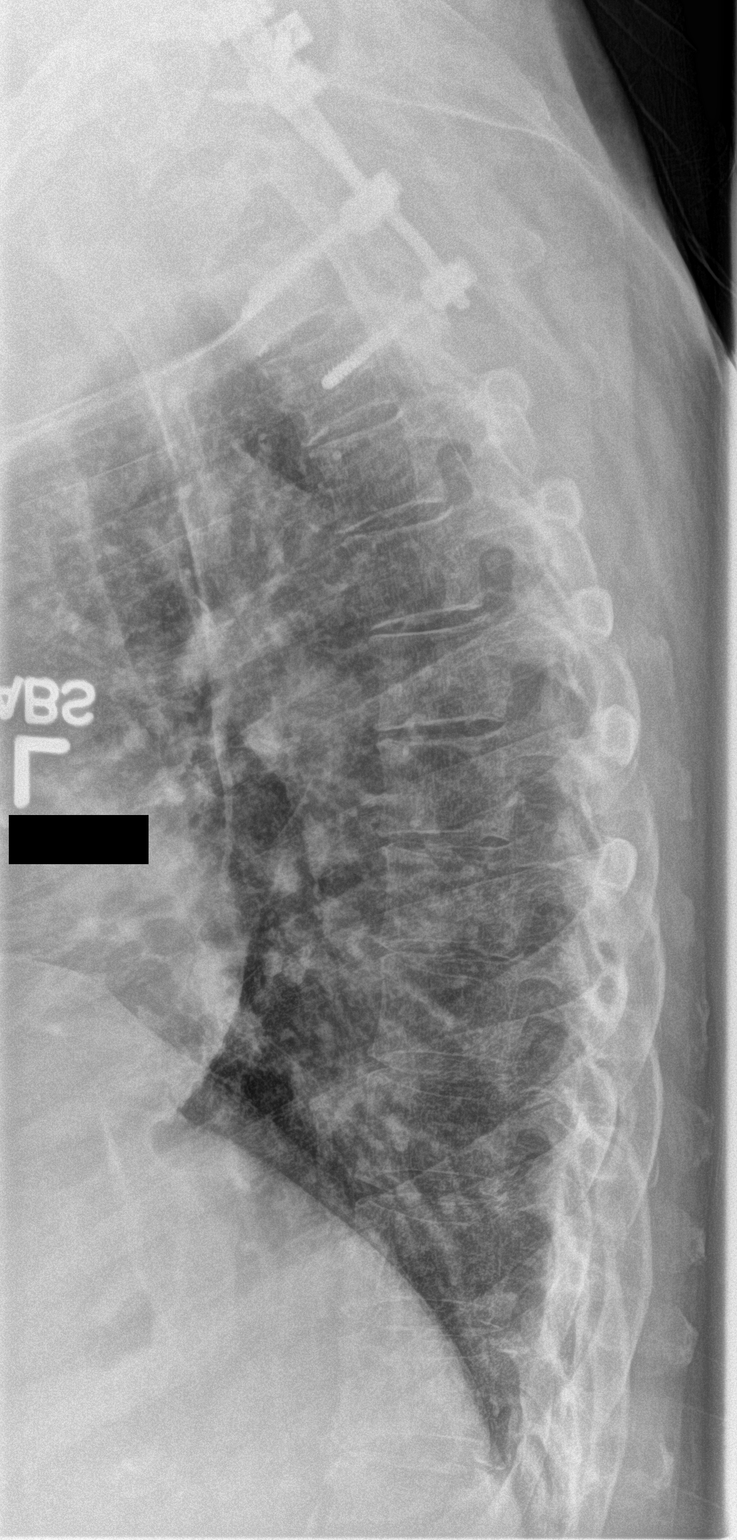
[im 3/3]
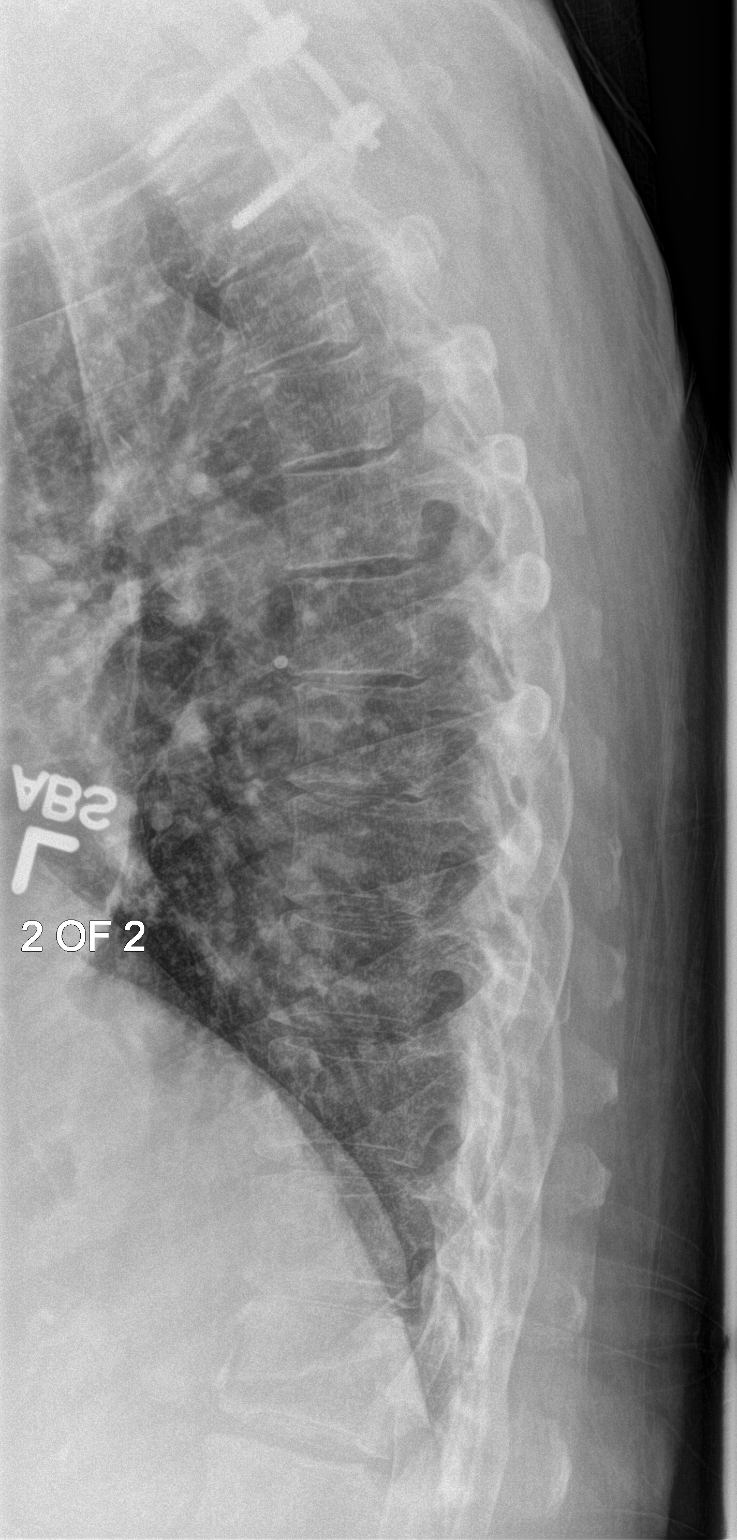

[3 of 3 positions shown; findings below may reference images not displayed]

FINDINGS: The patient has undergone occiput to upper thoracic fusion. There
are BILATERAL T1 and LEFT T2 screws. There is definite lucency
around the LEFT T2 and RIGHT T1 screws, suggesting motion. There is
no compression deformity. There may be slight anterolisthesis T2 on
T3 representing adjacent segment disease. No paravertebral masses.
IMPRESSION: The patient's upper thoracic pain may relate to hardware loosening.
Correlate clinically.

## 2015-12-11 IMAGING — CR DG LUMBAR SPINE 2-3V
1 series · 3 of 3 positions shown · non-contrast
Comparison: None.

CLINICAL DATA: Acute lower back pain.

EXAM:
LUMBAR SPINE - 2-3 VIEW

[Series 1: dxr lumbar spine ap and lateral · 0.14mm/px · 3 of 3 slices shown]
[im 1/3]
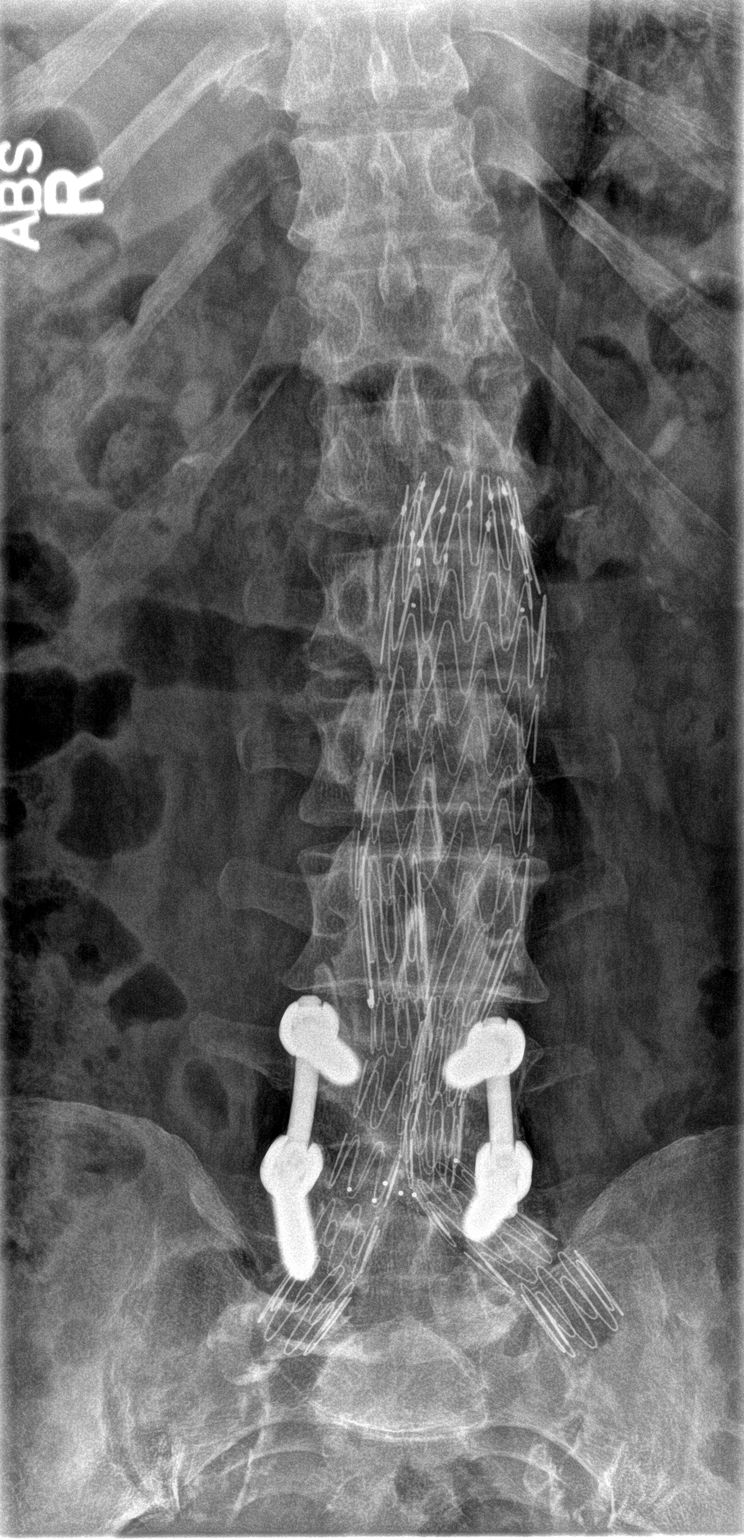
[im 2/3]
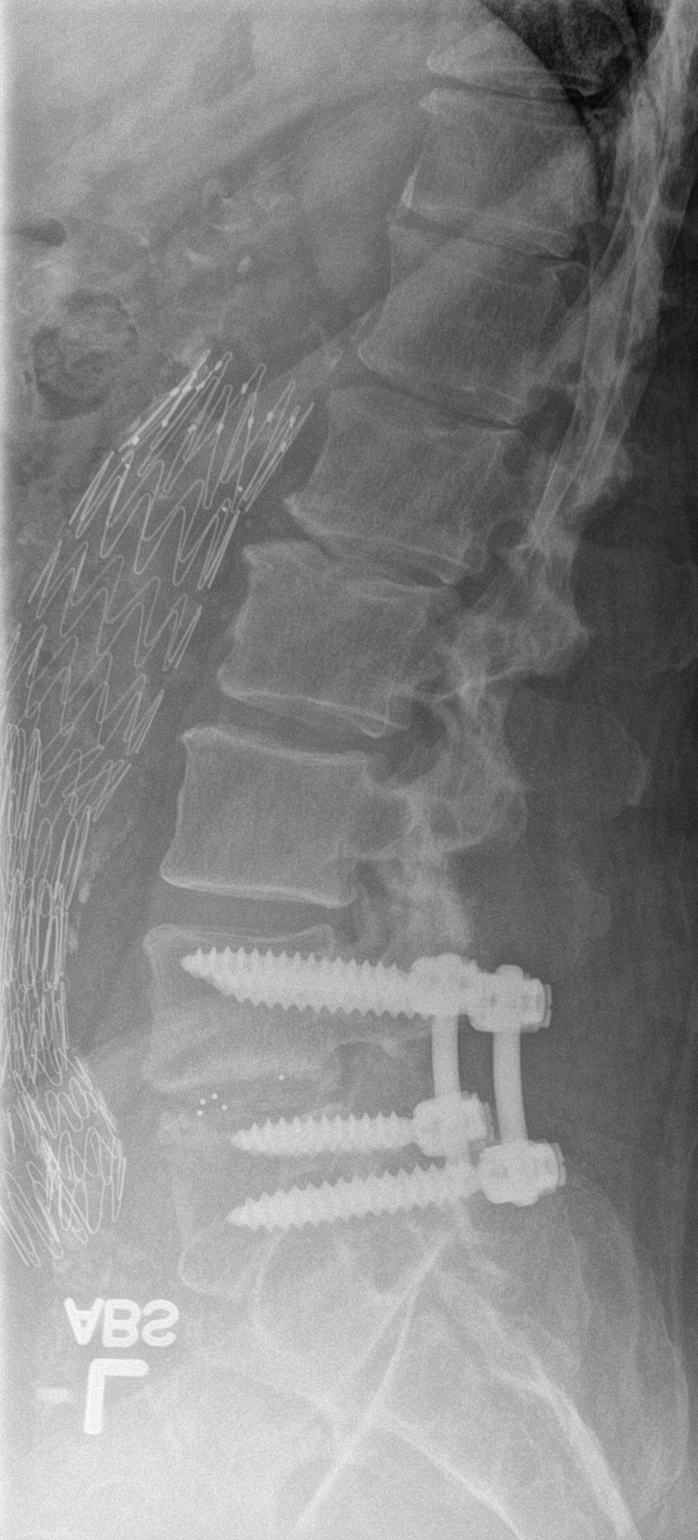
[im 3/3]
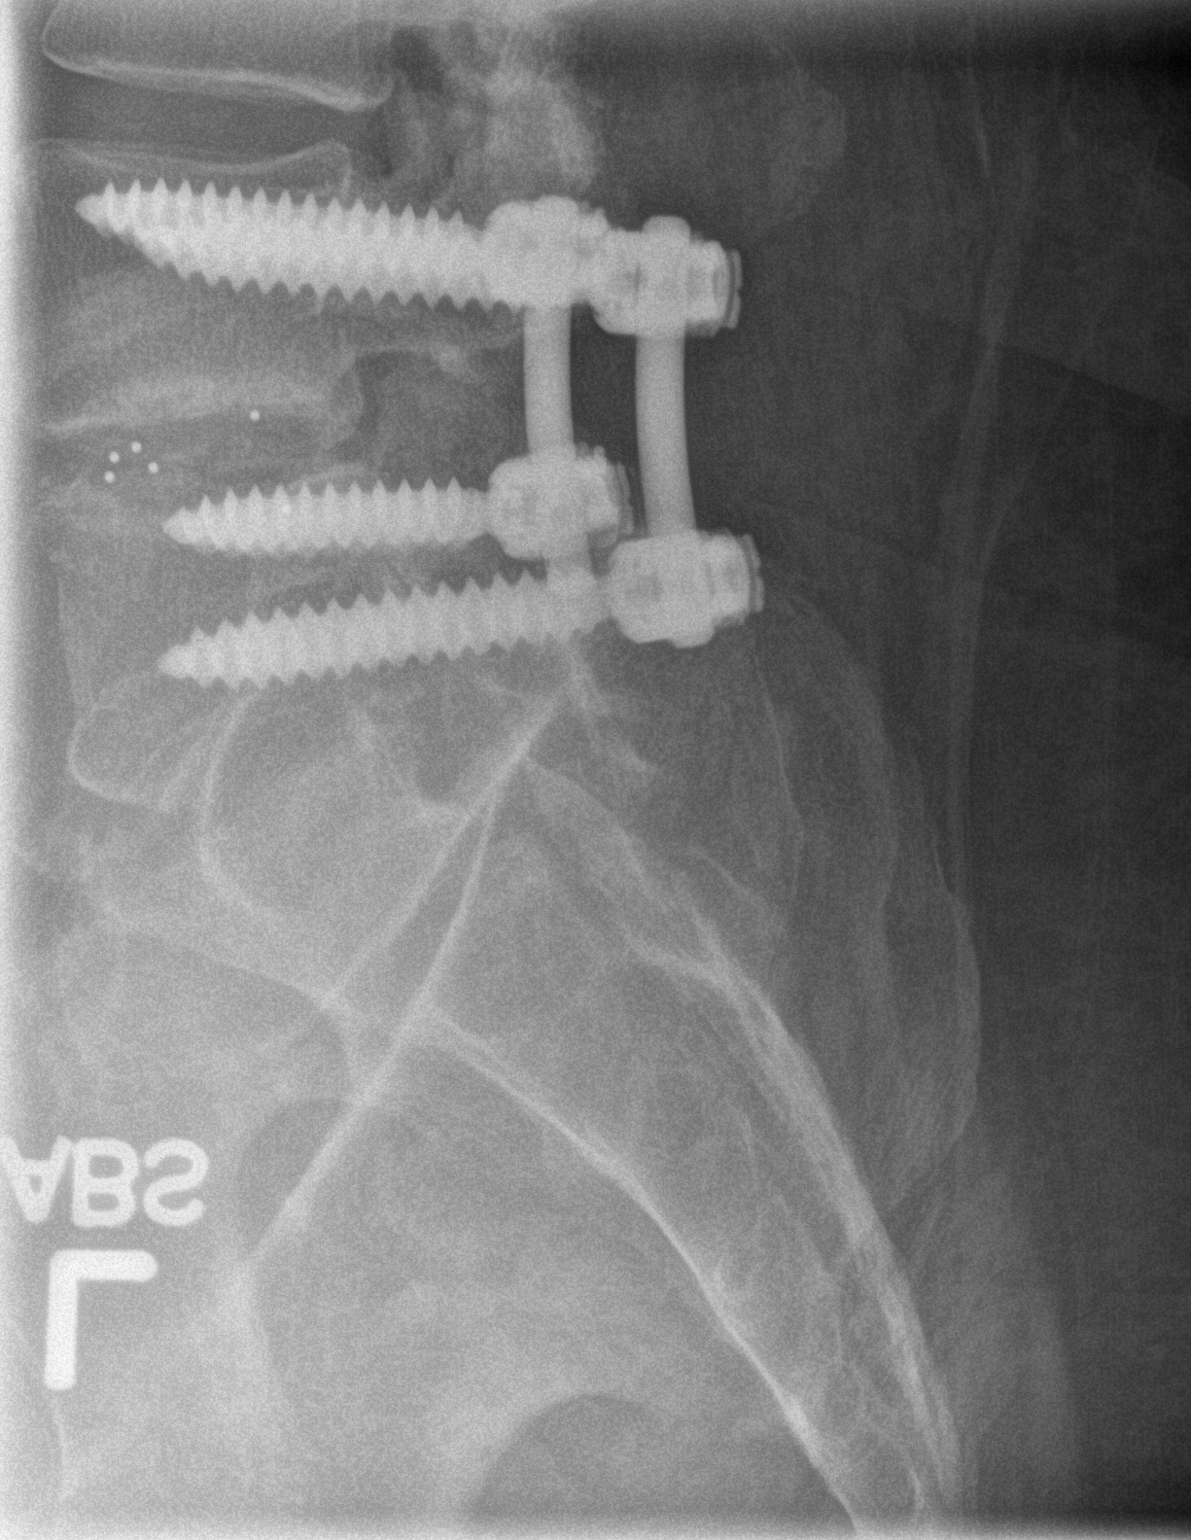

[3 of 3 positions shown; findings below may reference images not displayed]

FINDINGS: Status post surgical posterior fusion of L4-5 with bilateral
intrapedicular screw placement. Good alignment of vertebral bodies
is noted. No fracture or spondylolisthesis is noted. Moderate
degenerative disc disease is noted at L1-2. Status post endo graft
repair of abdominal aortic aneurysm.
IMPRESSION: Moderate degenerative disc disease is noted at L1-2. Status post
posterior fusion of L4-5. No acute abnormality seen in the lumbar
spine.

## 2016-01-12 ENCOUNTER — Ambulatory Visit: Payer: Self-pay | Admitting: Family

## 2016-01-12 ENCOUNTER — Other Ambulatory Visit (HOSPITAL_COMMUNITY): Payer: Self-pay

## 2016-01-23 IMAGING — CT CT HEAD WITHOUT CONTRAST
1 of 2 series · 13 of 30 positions shown, 17 images · non-contrast
Comparison: None.

CLINICAL DATA: Fell earlier today with decline in strength and
ability to walk.

EXAM:
CT HEAD WITHOUT CONTRAST
TECHNIQUE: Contiguous axial images were obtained from the base of the skull
through the vertex without intravenous contrast.

[Series 2: head wo · axial · 0.44mm/px · z∈[-93,+42]mm · 13 of 36 slices shown, 17 images]
[im 3/36  brain]
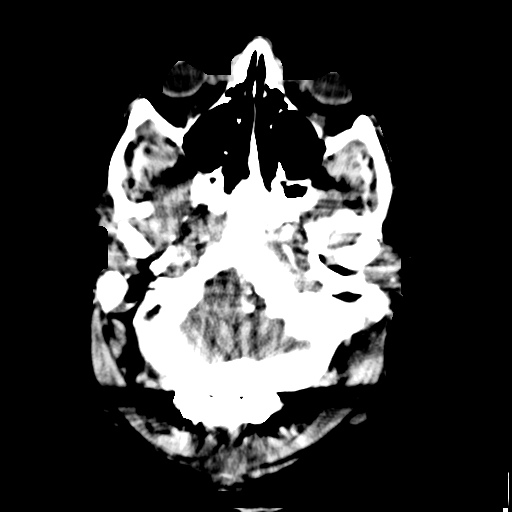
[im 3/36  bone]
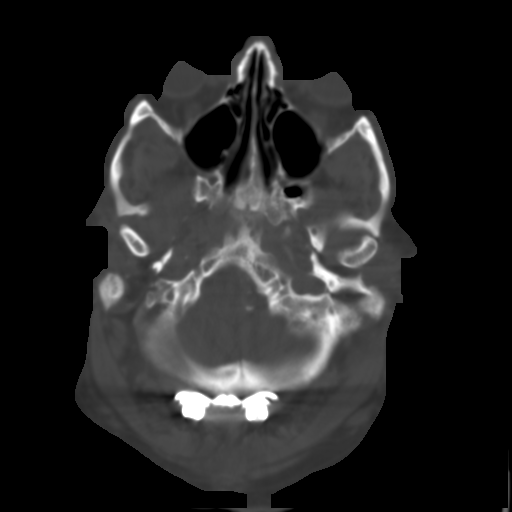
[im 6/36  brain]
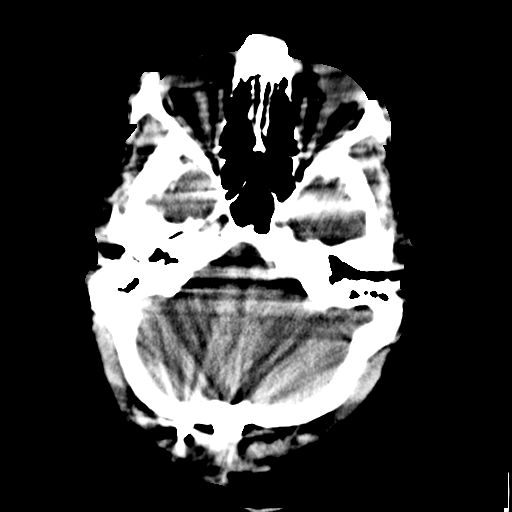
[im 8/36  brain]
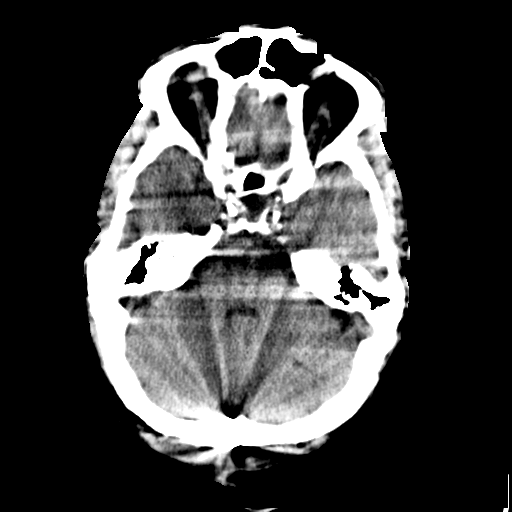
[im 11/36  brain]
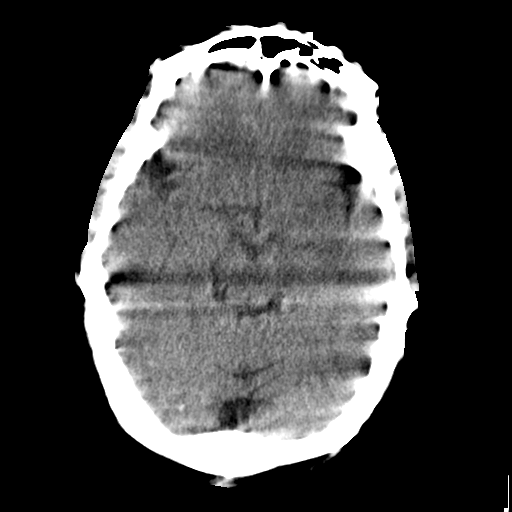
[im 13/36  brain]
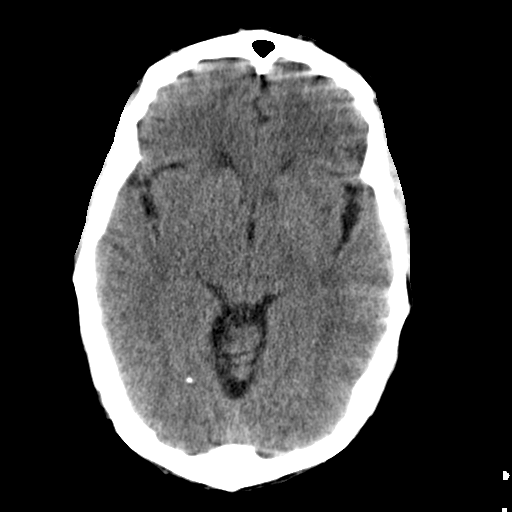
[im 13/36  bone]
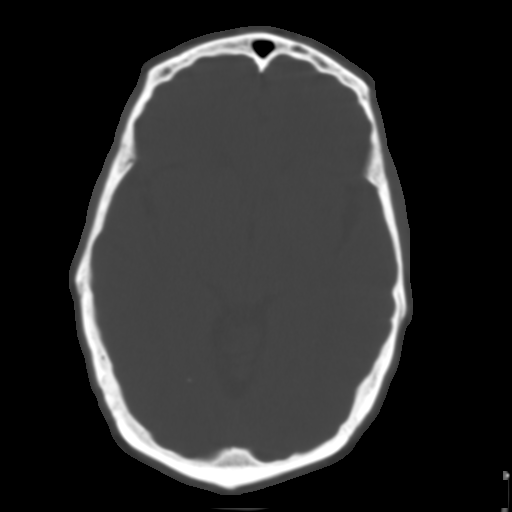
[im 16/36  brain]
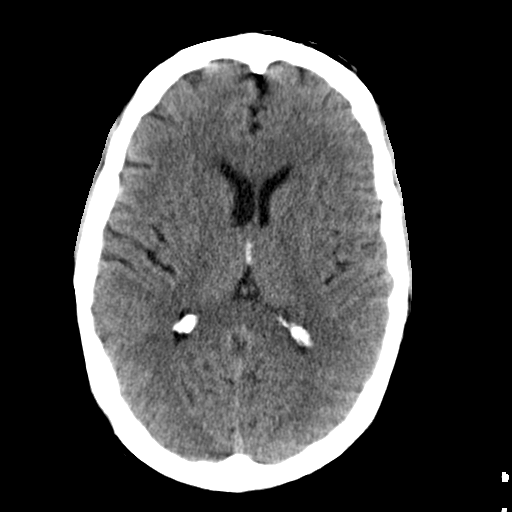
[im 18/36  brain]
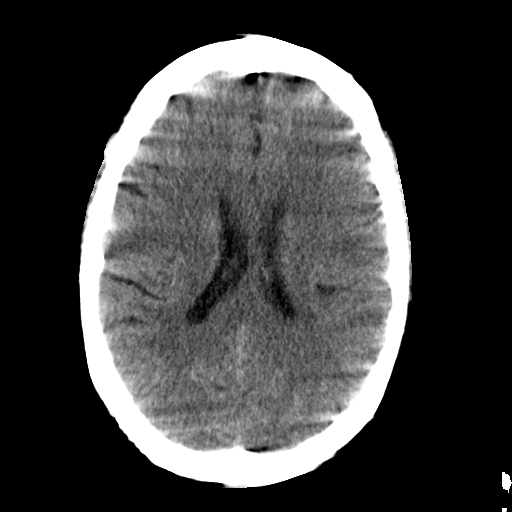
[im 21/36  brain]
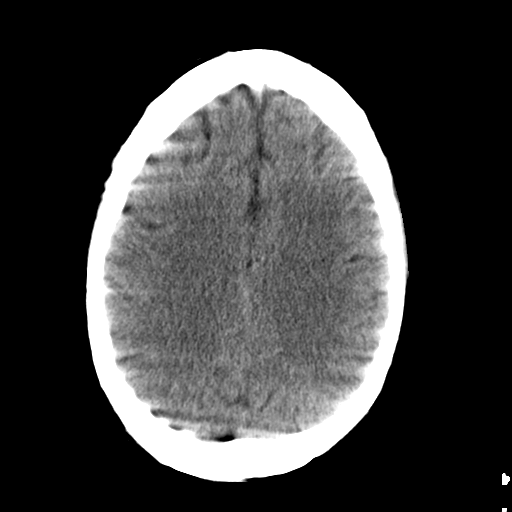
[im 23/36  brain]
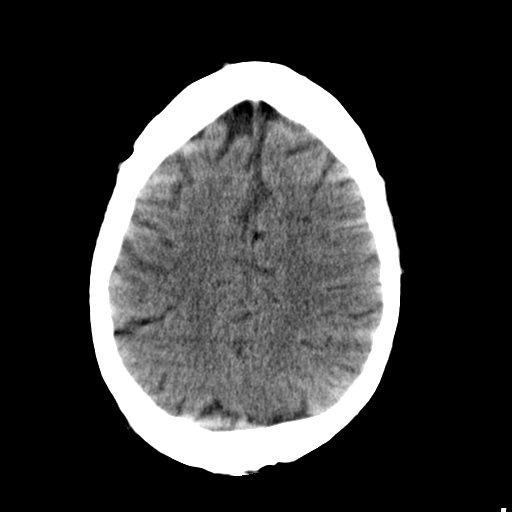
[im 23/36  bone]
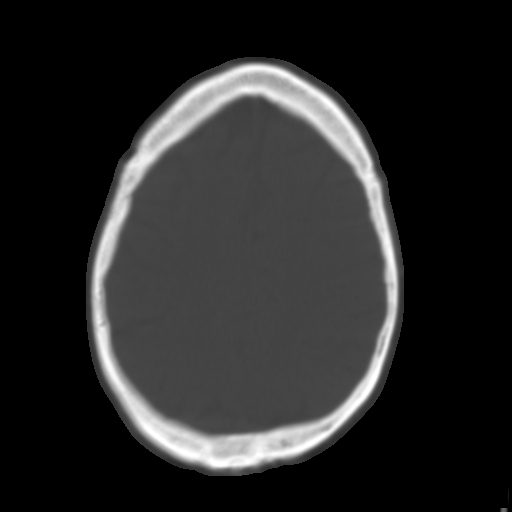
[im 26/36  brain]
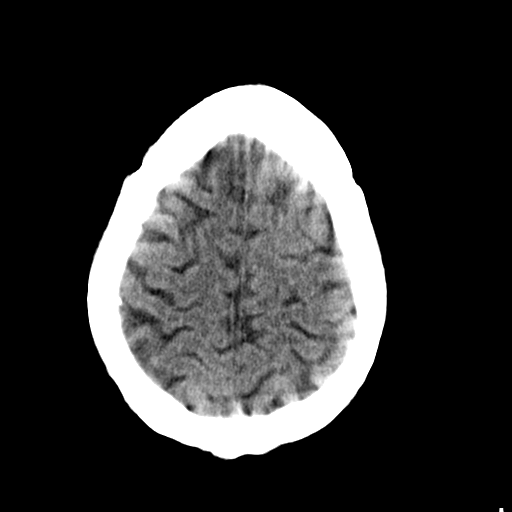
[im 28/36  brain]
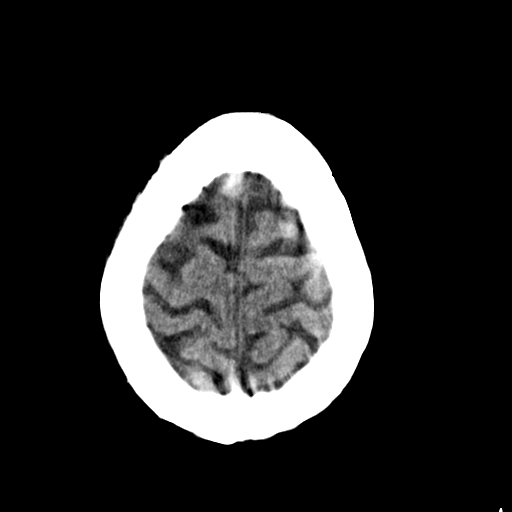
[im 31/36  brain]
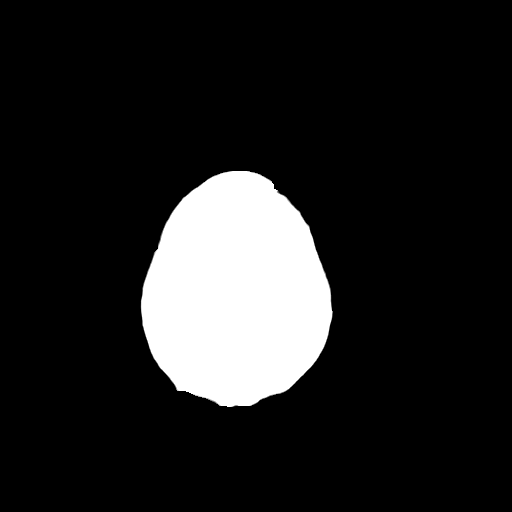
[im 33/36  brain]
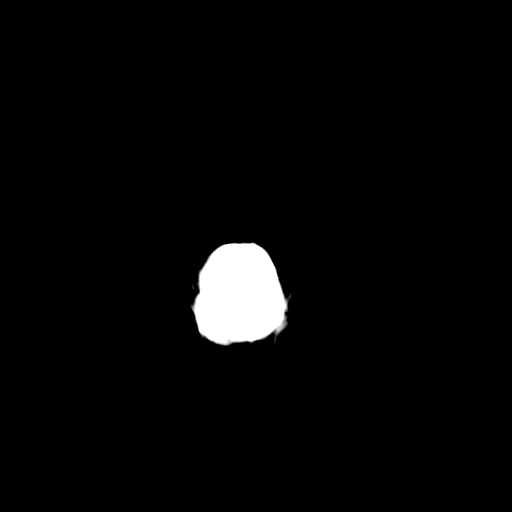
[im 33/36  bone]
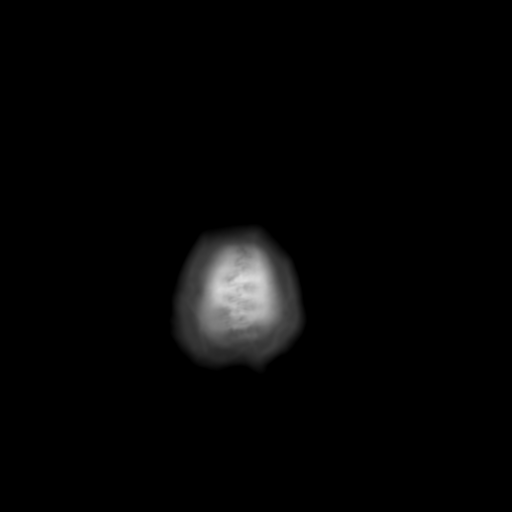

[13 of 30 positions shown; findings below may reference images not displayed]

FINDINGS: Patient has had previous craniocervical fusion. No complications
seen relative to that in the region scanned.

There is no evidence of acute infarction or intracranial hemorrhage.
No skull fracture.

There are numerous punctate calcifications along the surface of the
brain that are benign and chronic. These could relate to old
inflammatory disease. There is atherosclerotic calcification of the
major vessels at the base of the brain. No hydrocephalus. No
extra-axial collection. No mass lesion. Visualized sinuses, middle
ears and mastoids are clear.
IMPRESSION: No acute or traumatic finding.

Old craniocervical fusion.

Benign intraparenchymal brain surface calcifications.

## 2016-01-23 IMAGING — CR DG CHEST 1V PORT
1 series · 1 of 1 positions shown · non-contrast
Comparison: 04/11/2014

CLINICAL DATA: Initial evaluation, patient fell today, does not
remember falling, former smoker, personal history of abdominal
aortic aneurysm surgery, COPD, and hypertension

EXAM:
PORTABLE CHEST - 1 VIEW

[ap]
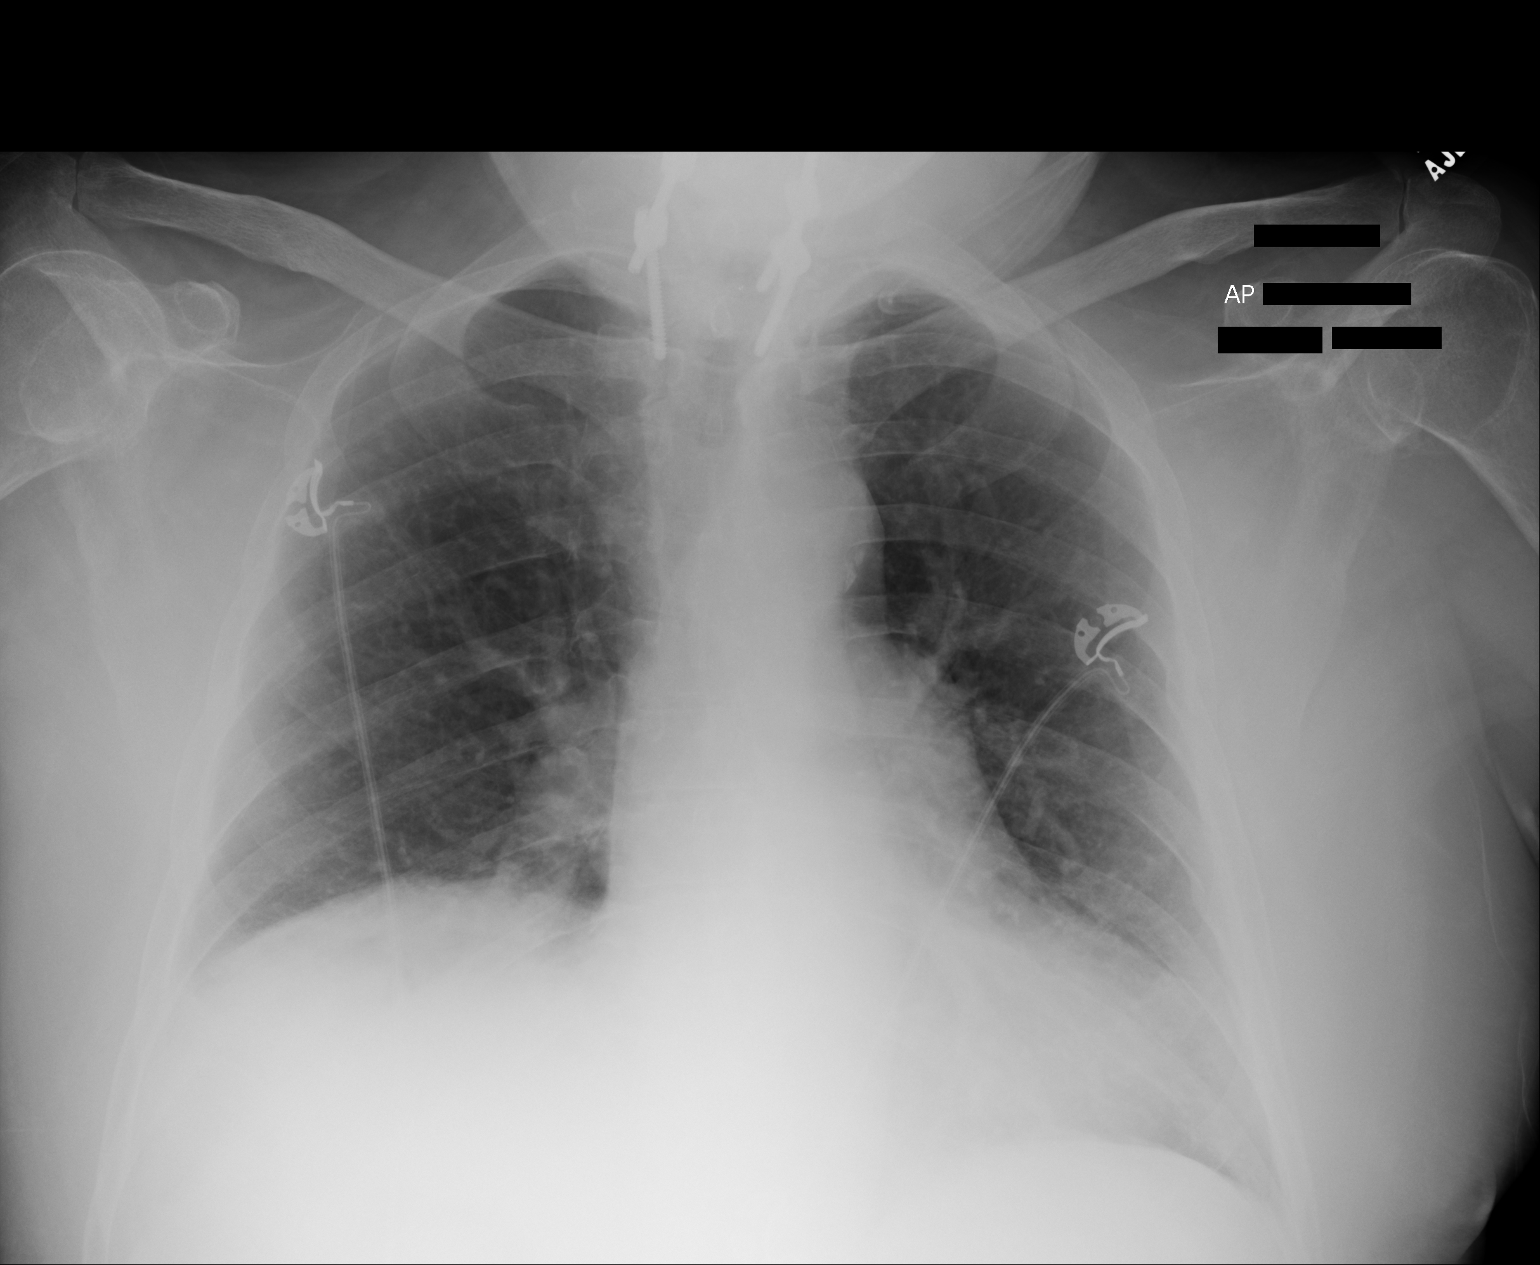

[1 of 1 positions shown; findings below may reference images not displayed]

FINDINGS: Moderate elevation of the right diaphragm stable. Mild to moderate
cardiac enlargement stable. Vascular pattern normal. Lungs clear. No
pneumothorax or effusion.
IMPRESSION: No active disease.

## 2016-01-27 IMAGING — CT CT CERVICAL SPINE WITHOUT CONTRAST
3 of 8 series · 11 of 35 positions shown, 13 images · non-contrast
Comparison: CT scans dated 05/24/2014 and 12/16/2013

CLINICAL DATA: Headache after a fall with head trauma this morning.

EXAM:
CT HEAD WITHOUT CONTRAST
CT CERVICAL SPINE WITHOUT CONTRAST
TECHNIQUE: Multidetector CT imaging of the head and cervical spine was
performed following the standard protocol without intravenous
contrast. Multiplanar CT image reconstructions of the cervical spine
were also generated.

[Series 8: sagittal bone · sagittal · 0.37mm/px · 5 of 76 slices shown, 6 images]
[im 26/76  bone]
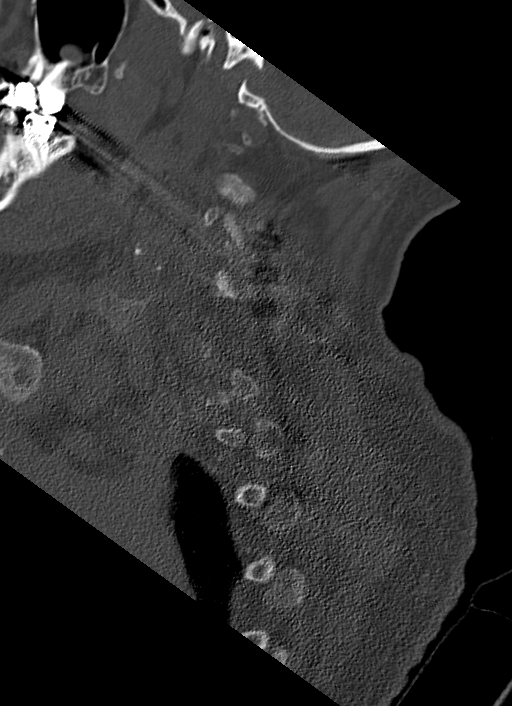
[im 32/76  bone]
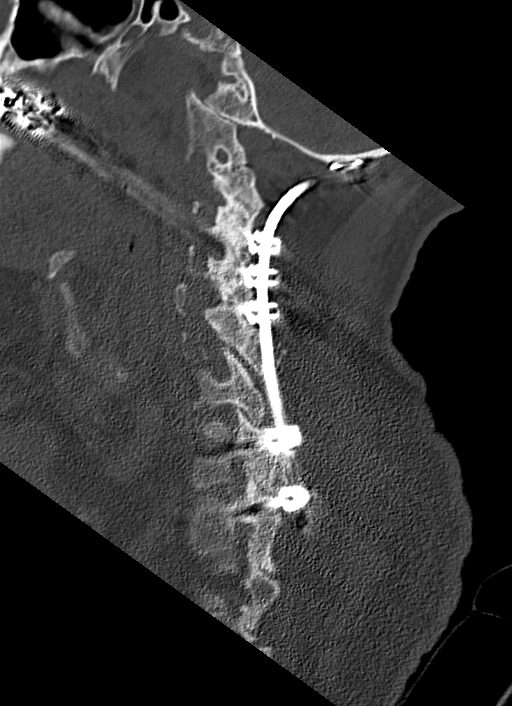
[im 38/76  soft-tissue]
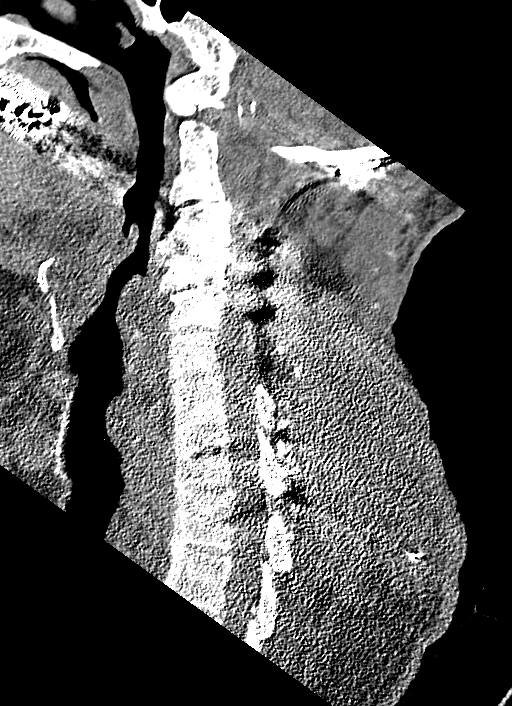
[im 38/76  bone]
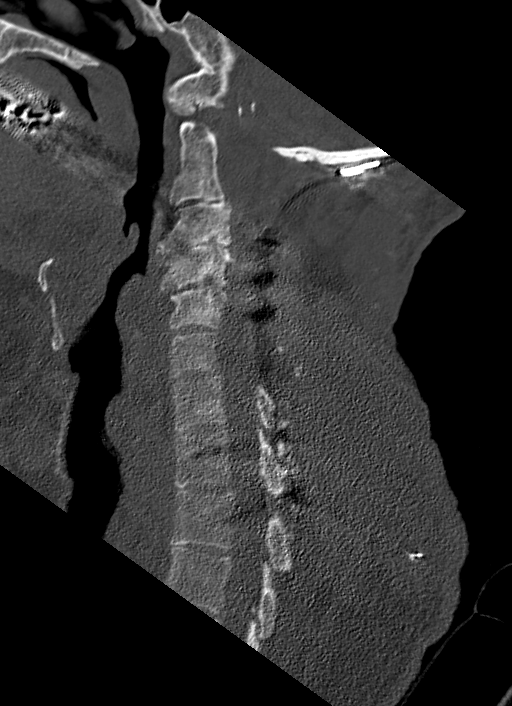
[im 44/76  bone]
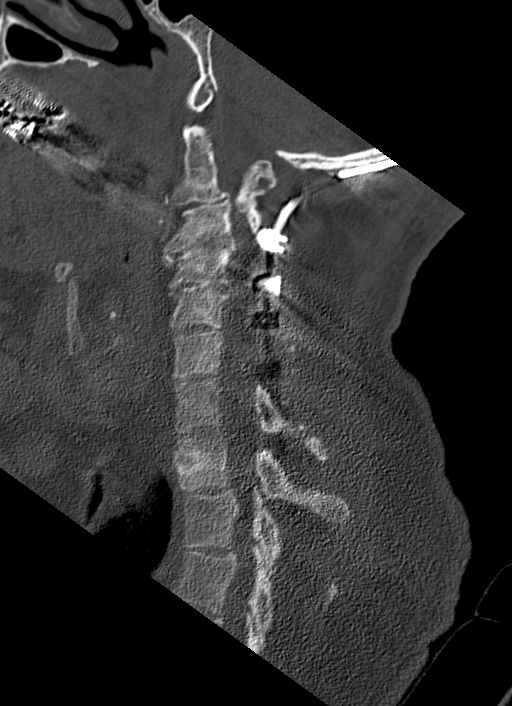
[im 51/76  bone]
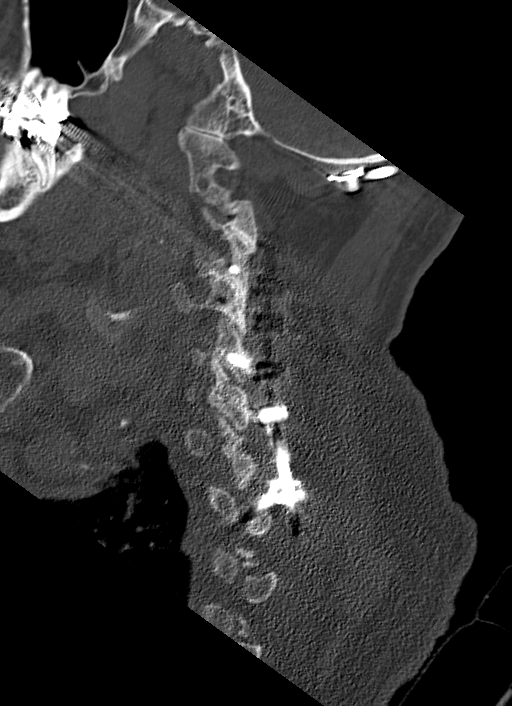

[Series 9: coronal bone · coronal · 0.46mm/px · 3 of 86 slices shown]
[im 27/86  bone]
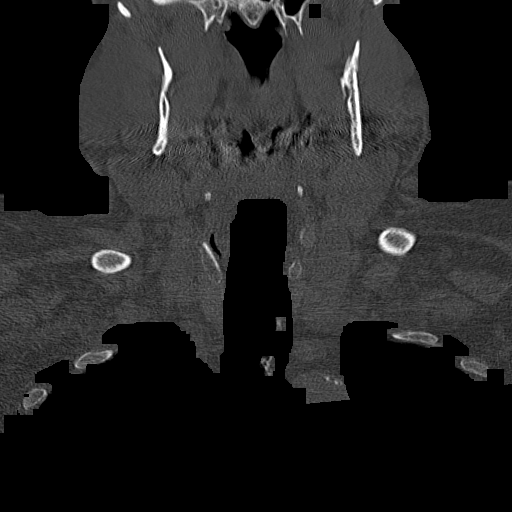
[im 38/86  bone]
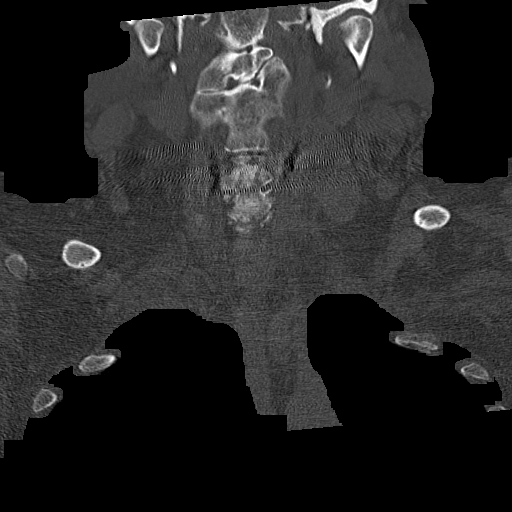
[im 48/86  bone]
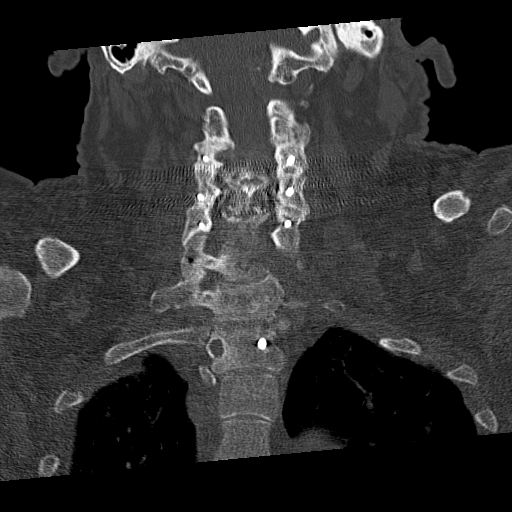

[Series 10: axial · axial · 0.38mm/px · z∈[-261,-163]mm · 3 of 123 slices shown, 4 images]
[im 31/123  soft-tissue]
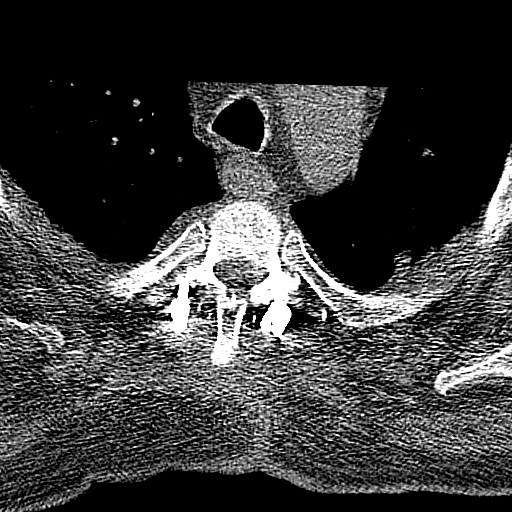
[im 31/123  bone]
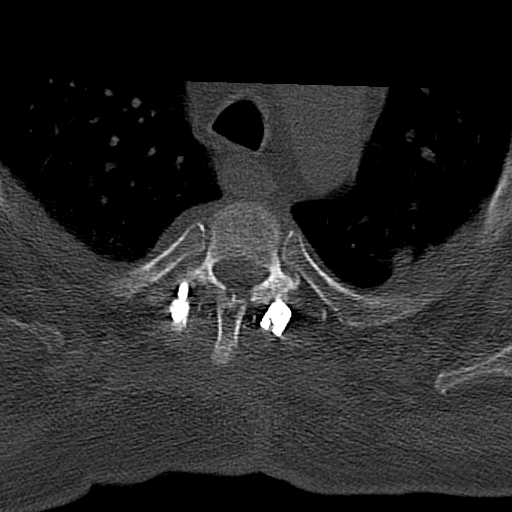
[im 62/123  bone]
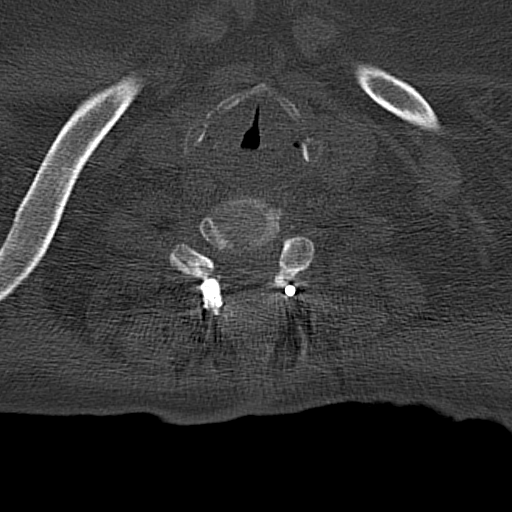
[im 92/123  bone]
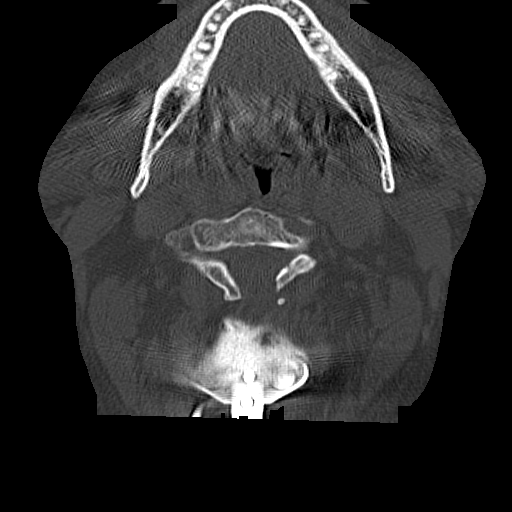

[11 of 35 positions shown; findings below may reference images not displayed]

FINDINGS: CT HEAD FINDINGS

No mass lesion. No midline shift. No acute hemorrhage or hematoma.
No extra-axial fluid collections. No evidence of acute infarction.
There are scattered tiny calcifications in the brain parenchyma.
Ventricles are not dilated. Slight asymmetry of the ventricles is
felt to be congenital.

No acute osseous abnormality. Scalp hematoma over the right side of
the frontal bone.

CT CERVICAL SPINE FINDINGS

There is no acute fracture or subluxation or prevertebral soft
tissue swelling. The cervical spine has been fused from the
skullbase through T2. There is bone resorption around the pedicle
screws in T2 indicating the tear may not be solid fusion posteriorly
at T1-2 although a portion of the left facet joint appears to be
fused on the sagittal images.

There is no visible solid bone fusion at C6-7 and at C7-T1. Right
pedicle screw has been removed T1.
IMPRESSION: 1. No acute intracranial abnormality. Scalp hematoma over the
frontal bone.
2. No acute abnormality of the cervical spine. Bone resorption
around the pedicle screws at T2 indicating loosening.

## 2016-02-18 ENCOUNTER — Emergency Department: Payer: Medicare Other

## 2016-02-18 ENCOUNTER — Encounter: Payer: Self-pay | Admitting: Emergency Medicine

## 2016-02-18 ENCOUNTER — Emergency Department
Admission: EM | Admit: 2016-02-18 | Discharge: 2016-02-18 | Disposition: A | Discharge status: Home / Self Care | Payer: Medicare Other | Attending: Emergency Medicine | Admitting: Emergency Medicine

## 2016-02-18 DIAGNOSIS — J449 Chronic obstructive pulmonary disease, unspecified: Secondary | ICD-10-CM | POA: Diagnosis not present

## 2016-02-18 DIAGNOSIS — E039 Hypothyroidism, unspecified: Secondary | ICD-10-CM | POA: Insufficient documentation

## 2016-02-18 DIAGNOSIS — I10 Essential (primary) hypertension: Secondary | ICD-10-CM | POA: Diagnosis not present

## 2016-02-18 DIAGNOSIS — M542 Cervicalgia: Secondary | ICD-10-CM

## 2016-02-18 DIAGNOSIS — M62838 Other muscle spasm: Secondary | ICD-10-CM

## 2016-02-18 DIAGNOSIS — Z7982 Long term (current) use of aspirin: Secondary | ICD-10-CM | POA: Insufficient documentation

## 2016-02-18 DIAGNOSIS — Z87891 Personal history of nicotine dependence: Secondary | ICD-10-CM | POA: Diagnosis not present

## 2016-02-18 DIAGNOSIS — M545 Low back pain, unspecified: Secondary | ICD-10-CM

## 2016-02-18 MED ORDER — DIAZEPAM 2 MG PO TABS: 2.0000 mg | ORAL_TABLET | Freq: Three times a day (TID) | ORAL | 0 refills | Status: DC | PRN

## 2016-02-18 MED ORDER — KETOROLAC TROMETHAMINE 30 MG/ML IJ SOLN
30.0000 mg | Freq: Once | INTRAMUSCULAR | Status: AC
  Administered 2016-02-18: 30 mg via INTRAMUSCULAR
  Filled 2016-02-18: qty 1

## 2016-02-18 NOTE — ED Notes (Signed)
States he fell about 1 week ago.  Having pain to neck and left lower back. States pain occasionally radiates into left leg

## 2016-02-18 NOTE — ED Triage Notes (Signed)
Pt fell last week and now having low back pain. Nad.

## 2016-02-18 NOTE — Discharge Instructions (Signed)
You may continue Oxycodone as previously prescribed as needed for severe pain.   Do not drive or drink alcohol if you are taking Oxycodone or Diazepam.

## 2016-02-18 NOTE — ED Provider Notes (Signed)
[facility] Emergency Department Provider Note  ____________________________________________  Time seen: Approximately 11:43 AM  I have reviewed the triage vital signs and the nursing notes.   HISTORY  Chief Complaint Back Pain    HPI Chad Huffman is a 70 y.o. male , NAD, presents to the emergency department with 1 week history of neck and lower back pain. Patient states he fell in his bathroom in the middle the night landing between the toilet and the bathtub one week ago. States that this happens often due to chronic medical conditions. Has had neck pain with tightness as well as lower back pain since that time. He notes he has chronic neck and lower back pain and his had fusions completed about the cervical and lumbar spine in the past. Was discharged from that orthopedic surgeon practiced as he was doing well. Has been on chronic oxycodone for pain but states he does not take it unless his pain is severe. He does have oxycodone tablets at home in which he has been able to take but has chosen not to take as he feels the pain is not severe enough for that medication. He denies head injury or loss of consciousness. Has not had any chest pain, palpitations, shortness breath, numbness, weakness, tingling. Denies saddle paresthesias or loss of bowel or bladder control. Has not noted any redness or swelling about his neck or back. No skin sores or open wounds.   Past Medical History:  Diagnosis Date  . Antalgic gait   . Carotid artery occlusion   . Chronic neck and back pain   . Complication of anesthesia    pt has had cervical fusion-limited neck flextion  . COPD (chronic obstructive pulmonary disease) (HCC)   . Depression   . DJD (degenerative joint disease)    Right shoulder  . Hypertension   . Lumbago   . Peripheral neuropathy [facility])     Patient Active Problem List   Diagnosis Date Noted  . Respiratory failure (HCC) 07/22/2015  . Cervical vertebral fusion    . History of lumbar laminectomy   . Chronic respiratory acidosis   . Depression   . Chronic neck and back pain   . Acute on chronic respiratory failure with hypercapnia (HCC)   . Acute on chronic respiratory failure with hypoxia (HCC)   . COPD exacerbation (HCC)   . HCAP (healthcare-associated pneumonia)   . Congestive dilated cardiomyopathy (HCC)   . Syncope and collapse   . Hyponatremia   . Other specified hypothyroidism   . Acute respiratory failure with hypoxia and hypercarbia (HCC) 08/04/2014  . S/P cervical spinal fusion 08/04/2014  . S/P lumbar laminectomy 08/04/2014  . Right shoulder pain 08/04/2014  . Arthritis of right hip 08/04/2014  . Essential hypertension 08/04/2014  . Hypothyroidism 08/04/2014  . Syncope 08/04/2014  . Fall 08/04/2014  . Low back pain 08/04/2014  . Pedal edema 08/04/2014  . Occlusion and stenosis of carotid artery without mention of cerebral infarction 12/10/2013  . Aftercare following surgery of the circulatory system, NEC 12/10/2013  . Pain of right upper extremity-Right Neck-Shoulder-Back 12/10/2013  . Abdominal aneurysm without mention of rupture 12/03/2011    Past Surgical History:  Procedure Laterality Date  . ABDOMINAL AORTIC ANEURYSM REPAIR  2009   Endovascular AAA repair  . ABDOMINAL AORTIC ANEURYSM REPAIR    . CARPAL TUNNEL RELEASE     bilateral CTS  . HAMMER TOE SURGERY  12/10/2011   Procedure: HAMMER TOE CORRECTION;  Surgeon:  Max Al Corpus, DPM;  Location: [facility];  Service: Podiatry;  Laterality: Right;  Right hammertoe repair second right with 2.5x42 orthopro screw   . METATARSAL OSTEOTOMY  12/10/2011   Procedure: METATARSAL OSTEOTOMY;  Surgeon: Ernestene Kiel, DPM;  Location: [facility];  Service: Podiatry;  Laterality: Right;  Right Keller arthroplasty with size 5 silicone implant right great toe; second metatarsal osteotomy with 2.0x14 screw  . NECK SURGERY    . PERIPHERAL VASCULAR CATHETERIZATION N/A  02/11/2015   Procedure: Carotid Angiography;  Surgeon: Nada Libman, MD;  Location[facility] INVASIVE CV LAB;  Service: Cardiovascular;  Laterality: N/A;  . PILONIDAL CYST / SINUS EXCISION    . Rotary cuff    . SPINE SURGERY  09/15/11   Scar tissue removed- back  . SPINE SURGERY  09/15/11   Scar tissue removed- back  . TONSILLECTOMY      Prior to Admission medications   Medication Sig Start Date End Date Taking? Authorizing Provider  ALPRAZolam (XANAX) 0.5 MG tablet Take 0.25-0.5 mg by mouth every 8 (eight) hours as needed for anxiety.     Historical Provider, MD  aspirin EC 81 MG tablet Take 81 mg by mouth daily.    Historical Provider, MD  baclofen (LIORESAL) 20 MG tablet Take 20 mg by mouth 3 (three) times daily as needed. For muscle spasms. 02/25/15   Historical Provider, MD  budesonide-formoterol (SYMBICORT) 160-4.5 MCG/ACT inhaler Inhale 2 puffs into the lungs 2 (two) times daily.    Historical Provider, MD  diazepam (VALIUM) 2 MG tablet Take 1 tablet (2 mg total) by mouth every 8 (eight) hours as needed for muscle spasms. 02/18/16 02/17/17  Elmyra Banwart L Taima Rada, PA-C  etodolac (LODINE) 500 MG tablet Take 500 mg by mouth 2 (two) times daily.    Historical Provider, MD  furosemide (LASIX) 40 MG tablet Take 1 tablet (40 mg total) by mouth 2 (two) times daily. 08/12/14   Lonia Blood, MD  Ipratropium-Albuterol (COMBIVENT RESPIMAT) 20-100 MCG/ACT AERS respimat Inhale 1 puff into the lungs every 6 (six) hours. 08/12/14   Lonia Blood, MD  ipratropium-albuterol (DUONEB) 0.5-2.5 (3) MG/3ML SOLN Take 3 mLs by nebulization every 6 (six) hours. Patient taking differently: Take 3 mLs by nebulization every 6 (six) hours as needed (for wheezing/shortness of breath).  08/12/14   Lonia Blood, MD  ketoconazole (NIZORAL) 2 % shampoo Apply 1 application topically 2 (two) times a week. Leave on 5 minutes then rinse well. 10/06/15   Historical Provider, MD  levothyroxine (SYNTHROID, LEVOTHROID) 125 MCG tablet  Take 1 tablet (125 mcg total) by mouth daily before breakfast. 08/12/14   Lonia Blood, MD  lisinopril (PRINIVIL,ZESTRIL) 10 MG tablet Take 10 mg by mouth daily.    Historical Provider, MD  mirtazapine (REMERON) 15 MG tablet Take 15 mg by mouth at bedtime.    Historical Provider, MD  Multiple Vitamin (MULTIVITAMIN WITH MINERALS) TABS tablet Take 1 tablet by mouth daily.    Historical Provider, MD  omeprazole (PRILOSEC) 20 MG capsule Take 20 mg by mouth daily.    Historical Provider, MD  oxyCODONE (ROXICODONE) 5 MG immediate release tablet Take 1 tablet (5 mg total) by mouth every 8 (eight) hours as needed. 04/23/15 04/22/16  Chinita Pester, FNP  sertraline (ZOLOFT) 100 MG tablet Take 100 mg by mouth daily.    Historical Provider, MD    Allergies Ampicillin; Nortriptyline; Brimonidine; Cyclobenzaprine; Tizanidine; and Gabapentin  Family History  Problem Relation Age  of Onset  . Cancer Mother     Stomach  . Hypertension Brother   . Heart attack Father     Social History Social History  Substance Use Topics  . Smoking status: Former Smoker    Quit date: 08/30/2006  . Smokeless tobacco: Never Used  . Alcohol use 1.2 oz/week    2 Cans of beer per week     Comment: occasional use     Review of Systems  Constitutional: No fever/chills, Fatigue Eyes: No visual changes. Cardiovascular: No chest pain, palpitations. Respiratory:No shortness of breath. No wheezing.  Gastrointestinal: No abdominal pain.  No nausea, vomiting.   Musculoskeletal: Positive for neck and back pain.  Skin: Negative for rash, skin sores, redness, swelling, bruising, open wounds or lacerations. Neurological: Negative for headaches, focal weakness or numbness. No tingling, saddle paresthesias, loss of bowel or bladder control. No LOC, dizziness 10-point ROS otherwise negative.  ____________________________________________   PHYSICAL EXAM:  VITAL SIGNS: ED Triage Vitals  Enc Vitals Group     BP 02/18/16  1124 (!) 164/96     Pulse Rate 02/18/16 1123 85     Resp 02/18/16 1123 18     Temp 02/18/16 1123 98.4 F (36.9 C)     Temp Source 02/18/16 1123 Oral     SpO2 02/18/16 1124 96 %     Weight 02/18/16 1123 233 lb (105.7 kg)     Height 02/18/16 1123 5\' 10"  (1.778 m)     Head Circumference --      Peak Flow --      Pain Score 02/18/16 1123 4     Pain Loc --      Pain Edu? --      Excl. in GC? --      Constitutional: Alert and oriented. Well appearing and in no acute distress. Eyes: Conjunctivae are normal. PERRL. EOMI without pain.  Head: Atraumatic. Neck: Decreased range of motion that is chronic due to previous spinal fusion. Deep central, vertical postsurgical scar noted diffusely about the posterior neck. Significant muscle spasm noted to bilateral trapezial muscles with tenderness to palpation bilaterally. No stridor.  Hematological/Lymphatic/Immunilogical: No cervical lymphadenopathy. Cardiovascular: Normal rate, regular rhythm. Normal S1 and S2.  Good peripheral circulation. Respiratory: Normal respiratory effort without tachypnea or retractions. Lungs CTAB. Musculoskeletal: Vertical postsurgical scar noted about the lumbar spine. No central lumbar spine tenderness noted. No paraspinal tenderness or muscle spasms appreciated. Negative bilateral straight leg raise. No SI joint tenderness. No lower extremity tenderness nor edema.  No joint effusions. Neurologic:  Normal speech and language. No gross focal neurologic deficits are appreciated.  Skin:  Skin is warm, dry and intact. No rash, redness, swelling, skin sores, bruising noted. Psychiatric: Mood and affect are normal. Speech and behavior are normal. Patient exhibits appropriate insight and judgement.   ____________________________________________   LABS  None ____________________________________________  EKG  None ____________________________________________  RADIOLOGY I have personally viewed and evaluated these  images (plain radiographs) as part of my medical decision making, as well as reviewing the written report by the radiologist.  Dg Cervical Spine Complete  Result Date: 02/18/2016 CLINICAL DATA:  Larey Seat 1 week ago. Pain radiating to legs. Posterior cervical fusion EXAM: CERVICAL SPINE - COMPLETE 4+ VIEW COMPARISON:  Cervical spine CT 07/22/2015 E FINDINGS: Posterior cervical fusion extending from the occipital bone through the upper thoracic spine. Lateral projection reveals C2 through C5 vertebral bodies with no subluxation identified. Degenerate spurring and endplate narrowing from C3 through C5. Lateral projections  demonstrate no hardware failure or subluxation. IMPRESSION: No acute findings of the cervical spine. Posterior fusion without complicating features. Electronically Signed   By: Genevive BiStewart  Edmunds M.D.   On: 02/18/2016 13:26   Dg Lumbar Spine 2-3 Views  Result Date: 02/18/2016 CLINICAL DATA:  Fall  low back pain EXAM: LUMBAR SPINE - 2-3 VIEW COMPARISON:  04/23/2015 FINDINGS: Metallic scaffolding from an aortic stent graft is again noted. The alignment of the lumbar spine is normal. Pedicle screws and posterior rods are identified at L4-5. Interbody spacer material is noted within the L4-5 disc space. Mild L5-S1 degenerative disc disease is noted. There is moderate L1-2 degenerative disc disease. No acute fractures or subluxations. IMPRESSION: 1. No acute findings. 2. Stable appearance of the lumbar spine status post L4-5 PLIF. Electronically Signed   By: Signa Kellaylor  Stroud M.D.   On: 02/18/2016 12:43    ____________________________________________    PROCEDURES  Procedure(s) performed: None   Procedures   Medications  ketorolac (TORADOL) 30 MG/ML injection 30 mg (30 mg Intramuscular Given 02/18/16 1214)     ____________________________________________   INITIAL IMPRESSION / ASSESSMENT AND PLAN / ED COURSE  Pertinent labs & imaging results that were available during my care of the  patient were reviewed by me and considered in my medical decision making (see chart for details).  Clinical Course  Comment By Time  Patient has been informed of lumbar x-ray results but we are still waiting on C-spine films to be red. Patient notes that he has decreasing pain since being given Toradol injection.  Hope PigeonJami L Damascus Feldpausch, PA-C 08/30 1325    Patient's diagnosis is consistent with Bilateral lower back pain without sciatica, cervicalgia and muscle spasms of the neck. Patient will be discharged home with prescriptions for diazepam to take as directed. Patient may continue over-the-counter medications as needed for pain and also may take previous prescription of oxycodone as needed for severe pain. Patient is to follow up with Dr. Hyacinth MeekerMiller in orthopedics or his primary care provider if symptoms persist past this treatment course. Patient is given ED precautions to return to the ED for any worsening or new symptoms.   ____________________________________________  FINAL CLINICAL IMPRESSION(S) / ED DIAGNOSES  Final diagnoses:  Bilateral low back pain without sciatica  Cervicalgia  Muscle spasms of neck      NEW MEDICATIONS STARTED DURING THIS VISIT:  Discharge Medication List as of 02/18/2016  1:45 PM    START taking these medications   Details  diazepam (VALIUM) 2 MG tablet Take 1 tablet (2 mg total) by mouth every 8 (eight) hours as needed for muscle spasms., Starting Wed 02/18/2016, Until Thu 02/17/2017, Print             Hope PigeonJami L Karyna Bessler, PA-C 02/18/16 1425    Nita Sicklearolina Veronese, MD 02/19/16 1159

## 2016-02-27 ENCOUNTER — Inpatient Hospital Stay
Admission: EM | Admit: 2016-02-27 | Discharge: 2016-02-29 | DRG: 917 | Disposition: A | Discharge status: Home / Self Care | Payer: Medicare Other | Attending: Specialist | Admitting: Specialist

## 2016-02-27 ENCOUNTER — Encounter: Payer: Self-pay | Admitting: Emergency Medicine

## 2016-02-27 ENCOUNTER — Emergency Department: Payer: Medicare Other

## 2016-02-27 DIAGNOSIS — Y92019 Unspecified place in single-family (private) house as the place of occurrence of the external cause: Secondary | ICD-10-CM

## 2016-02-27 DIAGNOSIS — J9691 Respiratory failure, unspecified with hypoxia: Secondary | ICD-10-CM | POA: Diagnosis present

## 2016-02-27 DIAGNOSIS — Z800 Family history of malignant neoplasm of digestive organs: Secondary | ICD-10-CM

## 2016-02-27 DIAGNOSIS — G8929 Other chronic pain: Secondary | ICD-10-CM | POA: Diagnosis present

## 2016-02-27 DIAGNOSIS — R0602 Shortness of breath: Secondary | ICD-10-CM | POA: Diagnosis present

## 2016-02-27 DIAGNOSIS — J9692 Respiratory failure, unspecified with hypercapnia: Secondary | ICD-10-CM

## 2016-02-27 DIAGNOSIS — R296 Repeated falls: Secondary | ICD-10-CM | POA: Diagnosis present

## 2016-02-27 DIAGNOSIS — Z7982 Long term (current) use of aspirin: Secondary | ICD-10-CM | POA: Diagnosis not present

## 2016-02-27 DIAGNOSIS — Z79899 Other long term (current) drug therapy: Secondary | ICD-10-CM

## 2016-02-27 DIAGNOSIS — J9602 Acute respiratory failure with hypercapnia: Secondary | ICD-10-CM | POA: Diagnosis present

## 2016-02-27 DIAGNOSIS — S7002XA Contusion of left hip, initial encounter: Secondary | ICD-10-CM | POA: Diagnosis present

## 2016-02-27 DIAGNOSIS — I10 Essential (primary) hypertension: Secondary | ICD-10-CM | POA: Diagnosis present

## 2016-02-27 DIAGNOSIS — Z981 Arthrodesis status: Secondary | ICD-10-CM | POA: Diagnosis not present

## 2016-02-27 DIAGNOSIS — Z9889 Other specified postprocedural states: Secondary | ICD-10-CM

## 2016-02-27 DIAGNOSIS — Z880 Allergy status to penicillin: Secondary | ICD-10-CM | POA: Diagnosis not present

## 2016-02-27 DIAGNOSIS — Z8249 Family history of ischemic heart disease and other diseases of the circulatory system: Secondary | ICD-10-CM

## 2016-02-27 DIAGNOSIS — I739 Peripheral vascular disease, unspecified: Secondary | ICD-10-CM | POA: Diagnosis present

## 2016-02-27 DIAGNOSIS — W19XXXA Unspecified fall, initial encounter: Secondary | ICD-10-CM | POA: Diagnosis present

## 2016-02-27 DIAGNOSIS — M25552 Pain in left hip: Secondary | ICD-10-CM

## 2016-02-27 DIAGNOSIS — M19011 Primary osteoarthritis, right shoulder: Secondary | ICD-10-CM | POA: Diagnosis present

## 2016-02-27 DIAGNOSIS — T424X1A Poisoning by benzodiazepines, accidental (unintentional), initial encounter: Secondary | ICD-10-CM | POA: Diagnosis present

## 2016-02-27 DIAGNOSIS — J441 Chronic obstructive pulmonary disease with (acute) exacerbation: Secondary | ICD-10-CM | POA: Diagnosis present

## 2016-02-27 DIAGNOSIS — M545 Low back pain: Secondary | ICD-10-CM | POA: Diagnosis present

## 2016-02-27 DIAGNOSIS — R4182 Altered mental status, unspecified: Secondary | ICD-10-CM

## 2016-02-27 DIAGNOSIS — Z888 Allergy status to other drugs, medicaments and biological substances status: Secondary | ICD-10-CM

## 2016-02-27 DIAGNOSIS — J449 Chronic obstructive pulmonary disease, unspecified: Secondary | ICD-10-CM | POA: Diagnosis present

## 2016-02-27 DIAGNOSIS — J9601 Acute respiratory failure with hypoxia: Secondary | ICD-10-CM | POA: Diagnosis present

## 2016-02-27 DIAGNOSIS — M542 Cervicalgia: Secondary | ICD-10-CM | POA: Diagnosis present

## 2016-02-27 DIAGNOSIS — Z8679 Personal history of other diseases of the circulatory system: Secondary | ICD-10-CM

## 2016-02-27 DIAGNOSIS — E039 Hypothyroidism, unspecified: Secondary | ICD-10-CM | POA: Diagnosis present

## 2016-02-27 DIAGNOSIS — Z87891 Personal history of nicotine dependence: Secondary | ICD-10-CM

## 2016-02-27 LAB — BASIC METABOLIC PANEL
ANION GAP: 7 (ref 5–15)
BUN: 20 mg/dL (ref 6–20)
CHLORIDE: 93 mmol/L — AB (ref 101–111)
CO2: 34 mmol/L — ABNORMAL HIGH (ref 22–32)
Calcium: 8.8 mg/dL — ABNORMAL LOW (ref 8.9–10.3)
Creatinine, Ser: 1.06 mg/dL (ref 0.61–1.24)
GFR calc non Af Amer: >60 mL/min (ref >60)
Glucose, Bld: 99 mg/dL (ref 65–99)
POTASSIUM: 4.3 mmol/L (ref 3.5–5.1)
SODIUM: 134 mmol/L — AB (ref 135–145)

## 2016-02-27 LAB — URINE DRUG SCREEN, QUALITATIVE (ARMC ONLY)
AMPHETAMINES, UR SCREEN: NOT DETECTED
Barbiturates, Ur Screen: NOT DETECTED
Benzodiazepine, Ur Scrn: POSITIVE — AB
COCAINE METABOLITE, UR ~~LOC~~: NOT DETECTED
Cannabinoid 50 Ng, Ur ~~LOC~~: NOT DETECTED
MDMA (ECSTASY) UR SCREEN: NOT DETECTED
METHADONE SCREEN, URINE: NOT DETECTED
Opiate, Ur Screen: NOT DETECTED
Phencyclidine (PCP) Ur S: NOT DETECTED
TRICYCLIC, UR SCREEN: NOT DETECTED

## 2016-02-27 LAB — BLOOD GAS, VENOUS
Patient temperature: 37
pCO2, Ven: 65 mmHg — ABNORMAL HIGH (ref 44.0–60.0)
pH, Ven: 7.36 (ref 7.250–7.430)
pO2, Ven: 49 mmHg — ABNORMAL HIGH (ref 32.0–45.0)

## 2016-02-27 LAB — CBC WITH DIFFERENTIAL/PLATELET
BASOS PCT: 1 %
Basophils Absolute: 0.1 10*3/uL (ref 0–0.1)
EOS ABS: 0.1 10*3/uL (ref 0–0.7)
Eosinophils Relative: 1 %
HCT: 31.6 % — ABNORMAL LOW (ref 40.0–52.0)
HEMOGLOBIN: 10.5 g/dL — AB (ref 13.0–18.0)
LYMPHS ABS: 0.7 10*3/uL — AB (ref 1.0–3.6)
Lymphocytes Relative: 9 %
MCH: 29.3 pg (ref 26.0–34.0)
MCHC: 33.4 g/dL (ref 32.0–36.0)
MCV: 87.9 fL (ref 80.0–100.0)
Monocytes Absolute: 0.9 10*3/uL (ref 0.2–1.0)
Monocytes Relative: 12 %
NEUTROS PCT: 77 %
Neutro Abs: 5.9 10*3/uL (ref 1.4–6.5)
Platelets: 141 10*3/uL — ABNORMAL LOW (ref 150–440)
RBC: 3.59 MIL/uL — AB (ref 4.40–5.90)
RDW: 17.1 % — ABNORMAL HIGH (ref 11.5–14.5)
WBC: 7.6 10*3/uL (ref 3.8–10.6)

## 2016-02-27 LAB — TROPONIN I: Troponin I: <0.03 ng/mL (ref <0.03)

## 2016-02-27 LAB — ACETAMINOPHEN LEVEL

## 2016-02-27 LAB — GLUCOSE, CAPILLARY: GLUCOSE-CAPILLARY: 105 mg/dL — AB (ref 65–99)

## 2016-02-27 LAB — SALICYLATE LEVEL: Salicylate Lvl: <4 mg/dL (ref 2.8–30.0)

## 2016-02-27 MED ORDER — LISINOPRIL 10 MG PO TABS
10.0000 mg | ORAL_TABLET | Freq: Every day | ORAL | Status: DC
  Administered 2016-02-28: 10 mg via ORAL
  Filled 2016-02-27 (×2): qty 1

## 2016-02-27 MED ORDER — ONDANSETRON HCL 4 MG/2ML IJ SOLN
4.0000 mg | Freq: Four times a day (QID) | INTRAMUSCULAR | Status: DC | PRN
  Administered 2016-02-28: 4 mg via INTRAVENOUS
  Filled 2016-02-27: qty 2

## 2016-02-27 MED ORDER — NALOXONE HCL 2 MG/2ML IJ SOSY
0.4000 mg | PREFILLED_SYRINGE | Freq: Once | INTRAMUSCULAR | Status: AC
  Administered 2016-02-27: 0.4 mg via INTRAVENOUS

## 2016-02-27 MED ORDER — ORAL CARE MOUTH RINSE
15.0000 mL | Freq: Two times a day (BID) | OROMUCOSAL | Status: DC
  Administered 2016-02-27: 15 mL via OROMUCOSAL

## 2016-02-27 MED ORDER — FUROSEMIDE 40 MG PO TABS
40.0000 mg | ORAL_TABLET | Freq: Two times a day (BID) | ORAL | Status: DC
  Administered 2016-02-28 – 2016-02-29 (×2): 40 mg via ORAL
  Filled 2016-02-27 (×3): qty 1

## 2016-02-27 MED ORDER — LEVOTHYROXINE SODIUM 125 MCG PO TABS
125.0000 ug | ORAL_TABLET | Freq: Every day | ORAL | Status: DC
  Administered 2016-02-28 – 2016-02-29 (×2): 125 ug via ORAL
  Filled 2016-02-27 (×2): qty 1

## 2016-02-27 MED ORDER — NALOXONE HCL 2 MG/2ML IJ SOSY
PREFILLED_SYRINGE | INTRAMUSCULAR | Status: AC
  Administered 2016-02-27: 0.4 mg via INTRAVENOUS
  Filled 2016-02-27: qty 2

## 2016-02-27 MED ORDER — PANTOPRAZOLE SODIUM 40 MG PO TBEC
40.0000 mg | DELAYED_RELEASE_TABLET | Freq: Every day | ORAL | Status: DC
  Administered 2016-02-28 – 2016-02-29 (×2): 40 mg via ORAL
  Filled 2016-02-27 (×2): qty 1

## 2016-02-27 MED ORDER — SODIUM CHLORIDE 0.9% FLUSH
3.0000 mL | Freq: Two times a day (BID) | INTRAVENOUS | Status: DC
  Administered 2016-02-27 – 2016-02-28 (×3): 3 mL via INTRAVENOUS

## 2016-02-27 MED ORDER — SERTRALINE HCL 100 MG PO TABS
100.0000 mg | ORAL_TABLET | Freq: Every day | ORAL | Status: DC
  Administered 2016-02-28 – 2016-02-29 (×2): 100 mg via ORAL
  Filled 2016-02-27 (×2): qty 1

## 2016-02-27 MED ORDER — HYDRALAZINE HCL 20 MG/ML IJ SOLN
10.0000 mg | INTRAMUSCULAR | Status: DC | PRN
  Administered 2016-02-28: 10 mg via INTRAVENOUS
  Filled 2016-02-27: qty 1

## 2016-02-27 MED ORDER — ACETAMINOPHEN 650 MG RE SUPP: 650.0000 mg | Freq: Four times a day (QID) | RECTAL | Status: DC | PRN

## 2016-02-27 MED ORDER — ONDANSETRON HCL 4 MG PO TABS: 4.0000 mg | ORAL_TABLET | Freq: Four times a day (QID) | ORAL | Status: DC | PRN

## 2016-02-27 MED ORDER — ENOXAPARIN SODIUM 40 MG/0.4ML ~~LOC~~ SOLN
40.0000 mg | SUBCUTANEOUS | Status: DC
  Administered 2016-02-28: 40 mg via SUBCUTANEOUS
  Filled 2016-02-27: qty 0.4

## 2016-02-27 MED ORDER — ACETAMINOPHEN 325 MG PO TABS
650.0000 mg | ORAL_TABLET | Freq: Four times a day (QID) | ORAL | Status: DC | PRN
Start: 2016-02-27 — End: 2016-02-29

## 2016-02-27 MED ORDER — MOMETASONE FURO-FORMOTEROL FUM 200-5 MCG/ACT IN AERO
2.0000 | INHALATION_SPRAY | Freq: Two times a day (BID) | RESPIRATORY_TRACT | Status: DC
  Administered 2016-02-27 – 2016-02-29 (×4): 2 via RESPIRATORY_TRACT
  Filled 2016-02-27: qty 8.8

## 2016-02-27 MED ORDER — ASPIRIN EC 81 MG PO TBEC
81.0000 mg | DELAYED_RELEASE_TABLET | Freq: Every day | ORAL | Status: DC
  Administered 2016-02-28 – 2016-02-29 (×2): 81 mg via ORAL
  Filled 2016-02-27 (×2): qty 1

## 2016-02-27 MED ORDER — SODIUM CHLORIDE 0.9 % IV SOLN
INTRAVENOUS | Status: DC
  Administered 2016-02-27: via INTRAVENOUS

## 2016-02-27 MED ORDER — IPRATROPIUM-ALBUTEROL 0.5-2.5 (3) MG/3ML IN SOLN: 3.0000 mL | RESPIRATORY_TRACT | Status: DC | PRN

## 2016-02-27 MED ORDER — IPRATROPIUM-ALBUTEROL 0.5-2.5 (3) MG/3ML IN SOLN
9.0000 mL | Freq: Once | RESPIRATORY_TRACT | Status: AC
  Administered 2016-02-27: 9 mL via RESPIRATORY_TRACT
  Filled 2016-02-27: qty 9

## 2016-02-27 NOTE — Progress Notes (Signed)
Pt placed on 2L [place]. Bipap on standby

## 2016-02-27 NOTE — ED Notes (Signed)
Pt still lethargic, arrousable. MD notified of status.

## 2016-02-27 NOTE — ED Triage Notes (Signed)
Patient from home via [facility] with c/o multiple falls over the past few weeks. Patient arrives notably obtunded with SPO2 @ 77% Patient's pupils noted to be pin point. Patient denies narcotic use. Wound noted to the top of the patient's head

## 2016-02-27 NOTE — ED Notes (Signed)
Pt awake upon entering. Pt less lethargic than normal and reports feeling better. VS wnl.

## 2016-02-27 NOTE — H&P (Signed)
[facility] - [place] at [facility]   PATIENT NAME: Chad Huffman    MR#:  [id number]  DATE OF BIRTH:  June 15, 1946  DATE OF ADMISSION:  02/27/2016  PRIMARY CARE PHYSICIAN: Dorothey Baseman, MD   REQUESTING/REFERRING PHYSICIAN: Don Perking, MD  CHIEF COMPLAINT:   Chief Complaint  Patient presents with  . Fall  . Altered Mental Status    HISTORY OF PRESENT ILLNESS:  Chad Huffman  is a 70 y.o. male who presents with Altered mental status and respiratory failure. Patient's family states that the patient sometimes will take Valium and Benadryl to help him sleep. As a patient became more cognizant that he felt he may have taken "too much Valium". However, he does not recall an exact dose of what he took. In the ED he was placed on BiPAP initially found to be hypercarbic.  He was also initially hypoxic, this corrected with supplement oxygen. Tox screen was only positive for benzos. He was able to wean off of BiPAP, and hospitalists were called for admission and further treatment.  PAST MEDICAL HISTORY:   Past Medical History:  Diagnosis Date  . Antalgic gait   . Carotid artery occlusion   . Chronic neck and back pain   . Complication of anesthesia    pt has had cervical fusion-limited neck flextion  . COPD (chronic obstructive pulmonary disease) (HCC)   . Depression   . DJD (degenerative joint disease)    Right shoulder  . Hypertension   . Lumbago   . Peripheral neuropathy (HCC)     PAST SURGICAL HISTORY:   Past Surgical History:  Procedure Laterality Date  . ABDOMINAL AORTIC ANEURYSM REPAIR  2009   Endovascular AAA repair  . ABDOMINAL AORTIC ANEURYSM REPAIR    . CARPAL TUNNEL RELEASE     bilateral CTS  . HAMMER TOE SURGERY  12/10/2011   Procedure: HAMMER TOE CORRECTION;  Surgeon: Ernestene Kiel, DPM;  Location: [facility];  Service: Podiatry;  Laterality: Right;  Right hammertoe repair second right with 2.5x42 orthopro screw   . METATARSAL  OSTEOTOMY  12/10/2011   Procedure: METATARSAL OSTEOTOMY;  Surgeon: Ernestene Kiel, DPM;  Location: [facility];  Service: Podiatry;  Laterality: Right;  Right Keller arthroplasty with size 5 silicone implant right great toe; second metatarsal osteotomy with 2.0x14 screw  . NECK SURGERY    . PERIPHERAL VASCULAR CATHETERIZATION N/A 02/11/2015   Procedure: Carotid Angiography;  Surgeon: Nada Libman, MD;  Location: [facility]VASIVE CV [facility];  Service: Cardiovascular;  Laterality: N/A;  . PILONIDAL CYST / SINUS EXCISION    . Rotary cuff    . SPINE SURGERY  09/15/11   Scar tissue removed- back  . SPINE SURGERY  09/15/11   Scar tissue removed- back  . TONSILLECTOMY      SOCIAL HISTORY:   Social History  Substance Use Topics  . Smoking status: Former Smoker    Quit date: 08/30/2006  . Smokeless tobacco: Never Used  . Alcohol use 1.2 oz/week    2 Cans of beer per week     Comment: occasional use    FAMILY HISTORY:   Family History  Problem Relation Age of Onset  . Cancer Mother     Stomach  . Hypertension Brother   . Heart attack Father     DRUG ALLERGIES:   Allergies  Allergen Reactions  . Ampicillin Swelling and Other (See Comments)    Unable to obtain enough information to answer additional questions  about this medication.  Has patient had a PCN reaction causing immediate rash, facial/tongue/throat swelling, SOB or lightheadedness with hypotension: Yes Has patient had a PCN reaction causing severe rash involving mucus membranes or skin necrosis: No Has patient had a PCN reaction that required hospitalization No Has patient had a PCN reaction occurring within the last 10 years: Yes If all of the above answers are "NO", then may pr  . Nortriptyline Other (See Comments)    Sleep walking  . Brimonidine Other (See Comments)    Reaction:  Unknown   . Cyclobenzaprine Other (See Comments)    Reaction: Hallucinations  . Tizanidine Other (See Comments)    Reaction:  Hallucinations  . Gabapentin Rash    MEDICATIONS AT HOME:   Prior to Admission medications   Medication Sig Start Date End Date Taking? Authorizing Provider  ALPRAZolam (XANAX) 0.5 MG tablet Take 0.25-0.5 mg by mouth every 8 (eight) hours as needed for anxiety.    Yes Historical Provider, MD  aspirin EC 81 MG tablet Take 81 mg by mouth daily.   Yes Historical Provider, MD  baclofen (LIORESAL) 20 MG tablet Take 20 mg by mouth 3 (three) times daily as needed. For muscle spasms. 02/25/15  Yes Historical Provider, MD  budesonide-formoterol (SYMBICORT) 160-4.5 MCG/ACT inhaler Inhale 2 puffs into the lungs 2 (two) times daily.   Yes Historical Provider, MD  diazepam (VALIUM) 2 MG tablet Take 1 tablet (2 mg total) by mouth every 8 (eight) hours as needed for muscle spasms. 02/18/16 02/17/17 Yes Jami L Hagler, PA-C  etodolac (LODINE) 500 MG tablet Take 500 mg by mouth 2 (two) times daily.   Yes Historical Provider, MD  furosemide (LASIX) 40 MG tablet Take 1 tablet (40 mg total) by mouth 2 (two) times daily. 08/12/14  Yes Lonia Blood, MD  Ipratropium-Albuterol (COMBIVENT RESPIMAT) 20-100 MCG/ACT AERS respimat Inhale 1 puff into the lungs every 6 (six) hours. 08/12/14  Yes Lonia Blood, MD  ipratropium-albuterol (DUONEB) 0.5-2.5 (3) MG/3ML SOLN Take 3 mLs by nebulization every 6 (six) hours. Patient taking differently: Take 3 mLs by nebulization every 6 (six) hours as needed (for wheezing/shortness of breath).  08/12/14  Yes Lonia Blood, MD  ketoconazole (NIZORAL) 2 % shampoo Apply 1 application topically 2 (two) times a week. Leave on 5 minutes then rinse well. 10/06/15  Yes Historical Provider, MD  levothyroxine (SYNTHROID, LEVOTHROID) 125 MCG tablet Take 1 tablet (125 mcg total) by mouth daily before breakfast. 08/12/14  Yes Lonia Blood, MD  lisinopril (PRINIVIL,ZESTRIL) 10 MG tablet Take 10 mg by mouth daily.   Yes Historical Provider, MD  mirtazapine (REMERON) 15 MG tablet Take 15 mg by  mouth at bedtime.   Yes Historical Provider, MD  Multiple Vitamin (MULTIVITAMIN WITH MINERALS) TABS tablet Take 1 tablet by mouth daily.   Yes Historical Provider, MD  omeprazole (PRILOSEC) 20 MG capsule Take 20 mg by mouth daily.   Yes Historical Provider, MD  sertraline (ZOLOFT) 100 MG tablet Take 100 mg by mouth daily.   Yes Historical Provider, MD  oxyCODONE (ROXICODONE) 5 MG immediate release tablet Take 1 tablet (5 mg total) by mouth every 8 (eight) hours as needed. 04/23/15 04/22/16  Chinita Pester, FNP    REVIEW OF SYSTEMS:  Review of Systems  Unable to perform ROS: Acuity of condition     VITAL SIGNS:   Vitals:   02/27/16 1939 02/27/16 1943 02/27/16 2000 02/27/16 2050  BP:   (!) 155/79 Marland Kitchen)  155/79  Pulse:   88 88  Resp:    20  Temp:      TempSrc:      SpO2: 94% 93% 97% 96%  Weight:      Height:       Wt Readings from Last 3 Encounters:  02/27/16 105.7 kg (233 lb)  02/18/16 105.7 kg (233 lb)  09/22/15 104.3 kg (230 lb)    PHYSICAL EXAMINATION:  Physical Exam  Vitals reviewed. Constitutional: He is oriented to person, place, and time. He appears well-developed and well-nourished. No distress.  HENT:  Head: Normocephalic and atraumatic.  Mouth/Throat: Oropharynx is clear and moist.  Eyes: Conjunctivae and EOM are normal. Pupils are equal, round, and reactive to light. No scleral icterus.  Neck: Normal range of motion. Neck supple. No JVD present. No thyromegaly present.  Cardiovascular: Normal rate, regular rhythm and intact distal pulses.  Exam reveals no gallop and no friction rub.   No murmur heard. Respiratory: Effort normal and breath sounds normal. No respiratory distress. He has no wheezes. He has no rales.  GI: Soft. Bowel sounds are normal. He exhibits no distension. There is no tenderness.  Musculoskeletal: Normal range of motion. He exhibits no edema.  No arthritis, no gout  Lymphadenopathy:    He has no cervical adenopathy.  Neurological: He is oriented  to person, place, and time. No cranial nerve deficit.  Somnolent but arousable, oriented when awake, otherwise difficult to assess due to his condition   Skin: Skin is warm and dry. No rash noted. No erythema.  Forehead hematoma and abrasion  Psychiatric:  Difficult to assess due to patient's condition    LABORATORY PANEL:   CBC  Recent Labs Lab 02/27/16 1617  WBC 7.6  HGB 10.5*  HCT 31.6*  PLT 141*   ------------------------------------------------------------------------------------------------------------------  Chemistries   Recent Labs Lab 02/27/16 1617  NA 134*  K 4.3  CL 93*  CO2 34*  GLUCOSE 99  BUN 20  CREATININE 1.06  CALCIUM 8.8*   ------------------------------------------------------------------------------------------------------------------  Cardiac Enzymes  Recent Labs Lab 02/27/16 1617  TROPONINI <0.03   ------------------------------------------------------------------------------------------------------------------  RADIOLOGY:  Ct Head Wo Contrast  Result Date: 02/27/2016 CLINICAL DATA:  Multiple falls, hypoxia, head injury EXAM: CT HEAD WITHOUT CONTRAST CT CERVICAL SPINE WITHOUT CONTRAST TECHNIQUE: Multidetector CT imaging of the head and cervical spine was performed following the standard protocol without intravenous contrast. Multiplanar CT image reconstructions of the cervical spine were also generated. COMPARISON:  07/22/2015 FINDINGS: CT HEAD FINDINGS Motion degraded images. Brain: No evidence of acute infarction, hemorrhage, hydrocephalus, extra-axial collection or mass lesion/mass effect. Stable subcortical hypodensity in the right frontal lobe, chronic. Subcortical white matter and periventricular small vessel ischemic changes. Intracranial atherosclerosis. Stable scattered cortical calcifications in the right hemisphere, chronic. Vascular: No hyperdense vessel or unexpected calcification. Skull: No evidence of calvarial fracture. Stable  postsurgical changes along the posterior occipital region. Sinuses/Orbits: The visualized paranasal sinuses are essentially clear. The mastoid air cells are unopacified. Other: Cerebral volume is within normal limits. No ventriculomegaly. CT CERVICAL SPINE FINDINGS Motion degraded images. Alignment: Straightening of the cervical spine. Stable posterior cervical fixation hardware extending from the occiput foot through T2. Stable lucency involving the T2 pedicle screws. No evidence of hardware fracture. Skull base and vertebrae: No acute fracture. No primary bone lesion or focal pathologic process. Soft tissues and spinal canal: No prevertebral fluid or swelling. No visible canal hematoma. Disc levels:  Moderate degenerative changes at C3-4 and C4-5. Spinal canal remains patent. Upper  chest: Visualized lung apices are clear. Other: Visualized thyroid is unremarkable. IMPRESSION: No evidence of acute intracranial abnormality. Small vessel ischemic changes. No evidence of traumatic injury to the cervical spine. Stable postsurgical changes, as above. Moderate degenerative changes of the mid cervical spine. Electronically Signed   By: Charline Bills M.D.   On: 02/27/2016 17:21   Ct Cervical Spine Wo Contrast  Result Date: 02/27/2016 CLINICAL DATA:  Multiple falls, hypoxia, head injury EXAM: CT HEAD WITHOUT CONTRAST CT CERVICAL SPINE WITHOUT CONTRAST TECHNIQUE: Multidetector CT imaging of the head and cervical spine was performed following the standard protocol without intravenous contrast. Multiplanar CT image reconstructions of the cervical spine were also generated. COMPARISON:  07/22/2015 FINDINGS: CT HEAD FINDINGS Motion degraded images. Brain: No evidence of acute infarction, hemorrhage, hydrocephalus, extra-axial collection or mass lesion/mass effect. Stable subcortical hypodensity in the right frontal lobe, chronic. Subcortical white matter and periventricular small vessel ischemic changes. Intracranial  atherosclerosis. Stable scattered cortical calcifications in the right hemisphere, chronic. Vascular: No hyperdense vessel or unexpected calcification. Skull: No evidence of calvarial fracture. Stable postsurgical changes along the posterior occipital region. Sinuses/Orbits: The visualized paranasal sinuses are essentially clear. The mastoid air cells are unopacified. Other: Cerebral volume is within normal limits. No ventriculomegaly. CT CERVICAL SPINE FINDINGS Motion degraded images. Alignment: Straightening of the cervical spine. Stable posterior cervical fixation hardware extending from the occiput foot through T2. Stable lucency involving the T2 pedicle screws. No evidence of hardware fracture. Skull base and vertebrae: No acute fracture. No primary bone lesion or focal pathologic process. Soft tissues and spinal canal: No prevertebral fluid or swelling. No visible canal hematoma. Disc levels:  Moderate degenerative changes at C3-4 and C4-5. Spinal canal remains patent. Upper chest: Visualized lung apices are clear. Other: Visualized thyroid is unremarkable. IMPRESSION: No evidence of acute intracranial abnormality. Small vessel ischemic changes. No evidence of traumatic injury to the cervical spine. Stable postsurgical changes, as above. Moderate degenerative changes of the mid cervical spine. Electronically Signed   By: Charline Bills M.D.   On: 02/27/2016 17:21   Dg Chest Port 1 View  Result Date: 02/27/2016 CLINICAL DATA:  Shortness of breath, multiple falls in past couple weeks, history hypertension, COPD EXAM: PORTABLE CHEST 1 VIEW COMPARISON:  Portable exam 1708 hours compared to 11/02/2015 FINDINGS: Enlargement of cardiac silhouette. Atherosclerotic calcification aorta. Mild pulmonary vascular congestion. Chronic elevation of RIGHT diaphragm with bowel interposition. Lungs grossly clear. No pleural effusion or pneumothorax. Bones demineralized. Prior cervical thoracic fusion. LEFT glenohumeral  degenerative changes. IMPRESSION: Enlargement of cardiac silhouette with pulmonary vascular congestion. No acute infiltrate. Aortic atherosclerosis. Electronically Signed   By: Ulyses Southward M.D.   On: 02/27/2016 17:29    EKG:   Orders placed or performed during the hospital encounter of 02/27/16  . EKG 12-Lead  . EKG 12-Lead  . EKG 12-Lead  . EKG 12-Lead    IMPRESSION AND PLAN:  Principal Problem:   Respiratory failure with hypoxia and hypercapnia (HCC) - placed on BiPAP initially as well as oxygen supplementation with good results. We will admit to stepdown, BiPAP when necessary if he continues to need it, O2 supplementation for now, repeat his blood gas. Active Problems:   Essential hypertension - continue home meds   Hypothyroidism - home dose thyroid replacement   Fall - likely due to benzo use   COPD (HCC) - continue home inhalers  All the records are reviewed and case discussed with ED provider. Management plans discussed with the patient and/or  family.  DVT PROPHYLAXIS: SubQ lovenox  GI PROPHYLAXIS: PPI  ADMISSION STATUS: Inpatient  CODE STATUS: Full Code Status History    Date Active Date Inactive Code Status Order ID Comments User Context   07/22/2015  7:30 PM 07/26/2015 12:16 AM Full Code [id number]  Adrian SaranSital Mody, MD Inpatient   02/11/2015 10:28 AM 02/11/2015  6:15 PM Full Code [id number]  Nada LibmanVance W Brabham, MD Inpatient   08/04/2014  8:13 PM 08/13/2014  2:53 PM Full Code [id number]  Lynden OxfordPranav Patel, MD Inpatient    Advance Directive Documentation   Flowsheet Row Most Recent Value  Type of Advance Directive  -- [unknown]  Pre-existing out of facility DNR order (yellow form or pink MOST form)  No data  "MOST" Form in Place?  No data      TOTAL TIME TAKING CARE OF THIS PATIENT: 45 minutes.    Kevyn Wengert FIELDING 02/27/2016, 9:04 PM  [facility]orsE[facility]  Office  [phone]  CC: Primary care physician; Dorothey BasemanAVID BRONSTEIN, MD

## 2016-02-27 NOTE — ED Notes (Signed)
Attempted second IV x2 

## 2016-02-27 NOTE — ED Provider Notes (Signed)
[facility] Emergency Department Provider Note  ____________________________________________  Time seen: Approximately 5:21 PM  I have reviewed the triage vital signs and the nursing notes.   HISTORY  Chief Complaint Fall and Altered Mental Status  Level 5 caveat:  Portions of the history and physical were unable to be obtained due to AMS   HPI Chad Huffman is a 70 y.o. male history of COPD, chronic back pain, peripheral neuropathy, DJD, depression who presents for evaluation of altered mental status. Patient is accompanied by a friend. Patient reports that he fell earlier today and hit his head on the floor. Patient is unable to tell me if he felt dizzy or if he just tripped and fell. He called a friend who is here with them to let her know that he had fallen and was complaining of left lower extremity pain after falling. When friend arrived to his house she noticed the patient was falling asleep in the middle of sentences and looks very confused. She also noticed a hematoma in his forehead which prompted her to call EMS. Per EMS patient was satting in the upper 70s and had pinpoint pupils. Patient denies narcotic use however reports that he took something to help him fall asleep. He is unable to tell me what he took. Patient has Xanax, Percocet, baclofen, Zoloft and Remeron all prescribed for him. Patient's falling asleep in the middle of sentence and looks very drowsy. He no longer has pinpoint pupils upon arrival. Patient was placed on 4 L nasal cannula.   Past Medical History:  Diagnosis Date  . Antalgic gait   . Carotid artery occlusion   . Chronic neck and back pain   . Complication of anesthesia    pt has had cervical fusion-limited neck flextion  . COPD (chronic obstructive pulmonary disease) (HCC)   . Depression   . DJD (degenerative joint disease)    Right shoulder  . Hypertension   . Lumbago   . Peripheral neuropathy [facility](HCC)     Patient Active  Problem List   Diagnosis Date Noted  . Respiratory failure (HCC) 07/22/2015  . Cervical vertebral fusion   . History of lumbar laminectomy   . Chronic respiratory acidosis   . Depression   . Chronic neck and back pain   . Acute on chronic respiratory failure with hypercapnia (HCC)   . Acute on chronic respiratory failure with hypoxia (HCC)   . COPD exacerbation (HCC)   . HCAP (healthcare-associated pneumonia)   . Congestive dilated cardiomyopathy (HCC)   . Syncope and collapse   . Hyponatremia   . Other specified hypothyroidism   . Acute respiratory failure with hypoxia and hypercarbia (HCC) 08/04/2014  . S/P cervical spinal fusion 08/04/2014  . S/P lumbar laminectomy 08/04/2014  . Right shoulder pain 08/04/2014  . Arthritis of right hip 08/04/2014  . Essential hypertension 08/04/2014  . Hypothyroidism 08/04/2014  . Syncope 08/04/2014  . Fall 08/04/2014  . Low back pain 08/04/2014  . Pedal edema 08/04/2014  . Occlusion and stenosis of carotid artery without mention of cerebral infarction 12/10/2013  . Aftercare following surgery of the circulatory system, NEC 12/10/2013  . Pain of right upper extremity-Right Neck-Shoulder-Back 12/10/2013  . Abdominal aneurysm without mention of rupture 12/03/2011    Past Surgical History:  Procedure Laterality Date  . ABDOMINAL AORTIC ANEURYSM REPAIR  2009   Endovascular AAA repair  . ABDOMINAL AORTIC ANEURYSM REPAIR    . CARPAL TUNNEL RELEASE  bilateral CTS  . HAMMER TOE SURGERY  12/10/2011   Procedure: HAMMER TOE CORRECTION;  Surgeon: Ernestene Kiel, DPM;  Location: [facility];  Service: Podiatry;  Laterality: Right;  Right hammertoe repair second right with 2.5x42 orthopro screw   . METATARSAL OSTEOTOMY  12/10/2011   Procedure: METATARSAL OSTEOTOMY;  Surgeon: Ernestene Kiel, DPM;  Location: [facility];  Service: Podiatry;  Laterality: Right;  Right Keller arthroplasty with size 5 silicone implant right great toe;  second metatarsal osteotomy with 2.0x14 screw  . NECK SURGERY    . PERIPHERAL VASCULAR CATHETERIZATION N/A 02/11/2015   Procedure: Carotid Angiography;  Surgeon: Nada Libman, MD;  Location: [facility] INVASIVE CV LAB;  Service: Cardiovascular;  Laterality: N/A;  . PILONIDAL CYST / SINUS EXCISION    . Rotary cuff    . SPINE SURGERY  09/15/11   Scar tissue removed- back  . SPINE SURGERY  09/15/11   Scar tissue removed- back  . TONSILLECTOMY      Prior to Admission medications   Medication Sig Start Date End Date Taking? Authorizing Provider  ALPRAZolam (XANAX) 0.5 MG tablet Take 0.25-0.5 mg by mouth every 8 (eight) hours as needed for anxiety.     Historical Provider, MD  aspirin EC 81 MG tablet Take 81 mg by mouth daily.    Historical Provider, MD  baclofen (LIORESAL) 20 MG tablet Take 20 mg by mouth 3 (three) times daily as needed. For muscle spasms. 02/25/15   Historical Provider, MD  budesonide-formoterol (SYMBICORT) 160-4.5 MCG/ACT inhaler Inhale 2 puffs into the lungs 2 (two) times daily.    Historical Provider, MD  diazepam (VALIUM) 2 MG tablet Take 1 tablet (2 mg total) by mouth every 8 (eight) hours as needed for muscle spasms. 02/18/16 02/17/17  Jami L Hagler, PA-C  etodolac (LODINE) 500 MG tablet Take 500 mg by mouth 2 (two) times daily.    Historical Provider, MD  furosemide (LASIX) 40 MG tablet Take 1 tablet (40 mg total) by mouth 2 (two) times daily. 08/12/14   Lonia Blood, MD  Ipratropium-Albuterol (COMBIVENT RESPIMAT) 20-100 MCG/ACT AERS respimat Inhale 1 puff into the lungs every 6 (six) hours. 08/12/14   Lonia Blood, MD  ipratropium-albuterol (DUONEB) 0.5-2.5 (3) MG/3ML SOLN Take 3 mLs by nebulization every 6 (six) hours. Patient taking differently: Take 3 mLs by nebulization every 6 (six) hours as needed (for wheezing/shortness of breath).  08/12/14   Lonia Blood, MD  ketoconazole (NIZORAL) 2 % shampoo Apply 1 application topically 2 (two) times a week. Leave on 5  minutes then rinse well. 10/06/15   Historical Provider, MD  levothyroxine (SYNTHROID, LEVOTHROID) 125 MCG tablet Take 1 tablet (125 mcg total) by mouth daily before breakfast. 08/12/14   Lonia Blood, MD  lisinopril (PRINIVIL,ZESTRIL) 10 MG tablet Take 10 mg by mouth daily.    Historical Provider, MD  mirtazapine (REMERON) 15 MG tablet Take 15 mg by mouth at bedtime.    Historical Provider, MD  Multiple Vitamin (MULTIVITAMIN WITH MINERALS) TABS tablet Take 1 tablet by mouth daily.    Historical Provider, MD  omeprazole (PRILOSEC) 20 MG capsule Take 20 mg by mouth daily.    Historical Provider, MD  oxyCODONE (ROXICODONE) 5 MG immediate release tablet Take 1 tablet (5 mg total) by mouth every 8 (eight) hours as needed. 04/23/15 04/22/16  Chinita Pester, FNP  sertraline (ZOLOFT) 100 MG tablet Take 100 mg by mouth daily.    Historical Provider, MD  Allergies Ampicillin; Nortriptyline; Brimonidine; Cyclobenzaprine; Tizanidine; and Gabapentin  Family History  Problem Relation Age of Onset  . Cancer Mother     Stomach  . Hypertension Brother   . Heart attack Father     Social History Social History  Substance Use Topics  . Smoking status: Former Smoker    Quit date: 08/30/2006  . Smokeless tobacco: Never Used  . Alcohol use 1.2 oz/week    2 Cans of beer per week     Comment: occasional use    Review of Systems  Constitutional: Negative for fever. Eyes: Negative for visual changes. ENT: Negative for sore throat. Cardiovascular: Negative for chest pain. Respiratory: Negative for shortness of breath. Gastrointestinal: Negative for abdominal pain, vomiting or diarrhea. Genitourinary: Negative for dysuria. Musculoskeletal: Negative for back pain. + LLE pain Skin: Negative for rash. Neurological: Negative for headaches, weakness or numbness.  ____________________________________________   PHYSICAL EXAM:  VITAL SIGNS: ED Triage Vitals  Enc Vitals Group     BP 02/27/16 1556  (!) 170/106     Pulse Rate 02/27/16 1556 81     Resp 02/27/16 1556 14     Temp 02/27/16 1556 98.4 F (36.9 C)     Temp Source 02/27/16 1556 Oral     SpO2 02/27/16 1556 (!) 77 %     Weight 02/27/16 1630 233 lb (105.7 kg)     Height 02/27/16 1630 5\' 10"  (1.778 m)     Head Circumference --      Peak Flow --      Pain Score 02/27/16 1632 6     Pain Loc --      Pain Edu? --      Excl. in GC? --     Constitutional: Alert and oriented, drowsy, falls asleep in the middle of sentences but wakes up and his name is called. NO distress HEENT:      Head: Normocephalic and large forehead hematoma.         Eyes: Conjunctivae are normal. Sclera is non-icteric. EOMI. PERRL      Mouth/Throat: Mucous membranes are moist.       Neck: Supple with no signs of meningismus. Cardiovascular: Regular rate and rhythm. No murmurs, gallops, or rubs. 2+ symmetrical distal pulses are present in all extremities. No JVD. Respiratory:  patient falls asleep and snores, decreased air movement bilaterally, no wheezes or crackles  Gastrointestinal: Soft, non tender, and non distended with positive bowel sounds. No rebound or guarding. Genitourinary: No CVA tenderness. Musculoskeletal: Nontender with normal range of motion in all extremities. No edema, cyanosis, or erythema of extremities. Neurologic:  normal pupils that are reactive, slurred speech, GCS of 14, moving all extremities, face is symmetric  Skin:Abrasion to his right knee    ____________________________________________   LABS (all labs ordered are listed, but only abnormal results are displayed)  Labs Reviewed  BLOOD GAS, ARTERIAL - Abnormal; Notable for the following:       Result Value   pH, Arterial 7.33 (*)    pCO2 arterial 72 (*)    Bicarbonate 38.0 (*)    Acid-Base Excess 10.1 (*)    All other components within normal limits  CBC WITH DIFFERENTIAL/PLATELET - Abnormal; Notable for the following:    RBC 3.59 (*)    Hemoglobin 10.5 (*)    HCT  31.6 (*)    RDW 17.1 (*)    Platelets 141 (*)    Lymphs Abs 0.7 (*)    All other components within normal  limits  BASIC METABOLIC PANEL - Abnormal; Notable for the following:    Sodium 134 (*)    Chloride 93 (*)    CO2 34 (*)    Calcium 8.8 (*)    All other components within normal limits  URINE DRUG SCREEN, QUALITATIVE ([facility] ONLY) - Abnormal; Notable for the following:    Benzodiazepine, Ur Scrn POSITIVE (*)    All other components within normal limits  ACETAMINOPHEN LEVEL - Abnormal; Notable for the following:    Acetaminophen (Tylenol), Serum <10 (*)    All other components within normal limits  BLOOD GAS, VENOUS - Abnormal; Notable for the following:    pCO2, Ven 65 (*)    pO2, Ven 49.0 (*)    All other components within normal limits  SALICYLATE LEVEL  TROPONIN I   ____________________________________________  EKG  ED ECG REPORT I, Nita Sickle, the attending physician, personally viewed and interpreted this ECG.  Normal sinus rhythm, rate of 77, first-degree AV block, normal QRS and QTc intervals, right axis deviation, no ST elevations or depressions. Unchanged from prior ____________________________________________  RADIOLOGY  Head CT negative  Cervical spine CT: negative  CXR: negative  ____________________________________________   PROCEDURES  Procedure(s) performed: None Procedures Critical Care performed: yes  CRITICAL CARE Performed by: Nita Sickle  ?  Total critical care time: 40 min  Critical care time was exclusive of separately billable procedures and treating other patients.  Critical care was necessary to treat or prevent imminent or life-threatening deterioration.  Critical care was time spent personally by me on the following activities: development of treatment plan with patient and/or surrogate as well as nursing, discussions with consultants, evaluation of patient's response to treatment, examination of patient, obtaining  history from patient or surrogate, ordering and performing treatments and interventions, ordering and review of laboratory studies, ordering and review of radiographic studies, pulse oximetry and re-evaluation of patient's condition.  ____________________________________________   INITIAL IMPRESSION / ASSESSMENT AND PLAN / ED COURSE  70 y.o. male history of COPD, chronic back pain, peripheral neuropathy, DJD, depression who presents for evaluation of altered mental status. Patient is somnolent and drowsy, falling asleep in the middle of sentences, tells me that he fell earlier today. Denies narcotics. Patient is moving all 4 extremities, face is symmetric, pupils are 3 mm and reactive bilaterally. Patient was taken straight to the CAT scan does not seem to have any abnormalities in his CT head. Patient was given 0.5 mg of Narcan with no changes in his mental status. ABG showing pH of 7.33 with a PCO2 of 72. Patient with severely diminished air movement bilaterally. Patient was started on BiPAP with continuous albuterol and ipratropium. Unclear etiology at this time possibly ingestion as patient is prescribed multiple sedating medications versus hypercapnic respiratory failure. We'll continue to observe closely until labs are back. Anticipate admission.  Clinical Course  Comment By Time  CBG showing improved PCO2 is 65 with a pH of 7.36. Patient continues to be extremely drowsy and sleepy however responds to sternum rub and is currently maintaining his airway. Drug screen only positive for benzodiazepines. Patient's brother thinks that his been taking Valium and Benadryl at home to help him sleep. No other acute findings. Will discuss with intensivist for admission Nita Sickle, MD 09/08 1904    Pertinent labs & imaging results that were available during my care of the patient were reviewed by me and considered in my medical decision making (see chart for  details).    ____________________________________________  FINAL CLINICAL IMPRESSION(S) / ED DIAGNOSES  Final diagnoses:  Altered mental status, unspecified altered mental status type  Acute respiratory failure with hypercapnia (HCC)      NEW MEDICATIONS STARTED DURING THIS VISIT:  New Prescriptions   No medications on file     Note:  This document was prepared using Dragon voice recognition software and may include unintentional dictation errors.    Nita Sickle, MD 02/27/16 [phone]

## 2016-02-27 NOTE — ED Notes (Signed)
Called to ICU in order to ask about status of bed assignment. CN informed this RN that she will be looking at the pt's chart soon.

## 2016-02-28 ENCOUNTER — Inpatient Hospital Stay: Payer: Medicare Other

## 2016-02-28 LAB — BLOOD GAS, ARTERIAL
Acid-Base Excess: 11.1 mmol/L — ABNORMAL HIGH (ref 0.0–2.0)
Bicarbonate: 37.4 mmol/L — ABNORMAL HIGH (ref 20.0–28.0)
O2 Saturation: 95.2 %
PATIENT TEMPERATURE: 37
PCO2 ART: 59 mmHg — AB (ref 32.0–48.0)
PH ART: 7.41 (ref 7.350–7.450)
PO2 ART: 76 mmHg — AB (ref 83.0–108.0)

## 2016-02-28 LAB — BASIC METABOLIC PANEL
Anion gap: 6 (ref 5–15)
BUN: 17 mg/dL (ref 6–20)
CALCIUM: 8.6 mg/dL — AB (ref 8.9–10.3)
CO2: 34 mmol/L — ABNORMAL HIGH (ref 22–32)
CREATININE: 0.88 mg/dL (ref 0.61–1.24)
Chloride: 95 mmol/L — ABNORMAL LOW (ref 101–111)
GFR calc non Af Amer: >60 mL/min (ref >60)
Glucose, Bld: 90 mg/dL (ref 65–99)
Potassium: 4 mmol/L (ref 3.5–5.1)
SODIUM: 135 mmol/L (ref 135–145)

## 2016-02-28 LAB — CBC
HCT: 29 % — ABNORMAL LOW (ref 40.0–52.0)
Hemoglobin: 9.8 g/dL — ABNORMAL LOW (ref 13.0–18.0)
MCH: 29.2 pg (ref 26.0–34.0)
MCHC: 33.9 g/dL (ref 32.0–36.0)
MCV: 86.2 fL (ref 80.0–100.0)
PLATELETS: 137 10*3/uL — AB (ref 150–440)
RBC: 3.36 MIL/uL — AB (ref 4.40–5.90)
RDW: 17.1 % — AB (ref 11.5–14.5)
WBC: 7 10*3/uL (ref 3.8–10.6)

## 2016-02-28 LAB — MRSA PCR SCREENING: MRSA BY PCR: NEGATIVE

## 2016-02-28 MED ORDER — OXYCODONE HCL 5 MG PO TABS
5.0000 mg | ORAL_TABLET | Freq: Three times a day (TID) | ORAL | Status: DC | PRN
  Administered 2016-02-28: 5 mg via ORAL
  Filled 2016-02-28: qty 1

## 2016-02-28 NOTE — Progress Notes (Addendum)
Pt's brother called asking for an update, but he did not have pt's password. He voluntarily told me that he was told he needed a password in order to be updated. Pt is alert and oriented x4. I asked the patient if I could give his brother an update and the password so that he may receive updates next time he calls. Pt gave me permission to tell his brother how he is doing and to provide his brother with the password. Luisa HartPatrick, RN in room as well to witness pt's providing permission to update brother and share password with brother.  Brother updated by patient over phone.

## 2016-02-28 NOTE — Progress Notes (Signed)
[facility] [place] [facility]   PATIENT NAME: Chad PaddockDanny Huffman    MR#:  [id number]  DATE OF BIRTH:  01/12/1946  SUBJECTIVE:  Came in after pt was found with AMS Doing well At baseline. C/o left hip pain s/p fall REVIEW OF SYSTEMS:   Review of Systems  Constitutional: Negative for chills, fever and weight loss.  HENT: Negative for ear discharge, ear pain and nosebleeds.   Eyes: Negative for blurred vision, pain and discharge.  Respiratory: Negative for sputum production, shortness of breath, wheezing and stridor.   Cardiovascular: Negative for chest pain, palpitations, orthopnea and PND.  Gastrointestinal: Negative for abdominal pain, diarrhea, nausea and vomiting.  Genitourinary: Negative for frequency and urgency.  Musculoskeletal: Positive for falls and joint pain. Negative for back pain.  Neurological: Positive for weakness. Negative for sensory change, speech change and focal weakness.  Psychiatric/Behavioral: Negative for depression and hallucinations. The patient is not nervous/anxious.    Tolerating Diet:yes Tolerating PT: pending  DRUG ALLERGIES:   Allergies  Allergen Reactions  . Ampicillin Swelling and Other (See Comments)    Unable to obtain enough information to answer additional questions about this medication.  Has patient had a PCN reaction causing immediate rash, facial/tongue/throat swelling, SOB or lightheadedness with hypotension: Yes Has patient had a PCN reaction causing severe rash involving mucus membranes or skin necrosis: No Has patient had a PCN reaction that required hospitalization No Has patient had a PCN reaction occurring within the last 10 years: Yes If all of the above answers are "NO", then may pr  . Nortriptyline Other (See Comments)    Sleep walking  . Brimonidine Other (See Comments)    Reaction:  Unknown   . Cyclobenzaprine Other (See Comments)    Reaction: Hallucinations  . Tizanidine Other (See Comments)     Reaction: Hallucinations  . Gabapentin Rash    VITALS:  Blood pressure (!) 104/57, pulse 72, temperature 98.2 F (36.8 C), temperature source Oral, resp. rate 13, height 5\' 8"  (1.727 m), weight 105.2 kg (231 lb 14.8 oz), SpO2 96 %.  PHYSICAL EXAMINATION:   Physical Exam  GENERAL:  70 y.o.-year-old patient lying in the bed with no acute distress.  EYES: Pupils equal, round, reactive to light and accommodation. No scleral icterus. Extraocular muscles intact.  HEENT: Head atraumatic, normocephalic. Oropharynx and nasopharynx clear.  NECK:  Supple, no jugular venous distention. No thyroid enlargement, no tenderness.  LUNGS: Normal breath sounds bilaterally, no wheezing, rales, rhonchi. No use of accessory muscles of respiration.  CARDIOVASCULAR: S1, S2 normal. No murmurs, rubs, or gallops.  ABDOMEN: Soft, nontender, nondistended. Bowel sounds present. No organomegaly or mass.  EXTREMITIES: No cyanosis, clubbing or edema b/l.    NEUROLOGIC: Cranial nerves II through XII are intact. No focal Motor or sensory deficits b/l.   PSYCHIATRIC:  patient is alert and oriented x 3.  SKIN: No obvious rash, lesion, or ulcer.   LABORATORY PANEL:  CBC  Recent Labs Lab 02/28/16 0414  WBC 7.0  HGB 9.8*  HCT 29.0*  PLT 137*    Chemistries   Recent Labs Lab 02/28/16 0414  NA 135  K 4.0  CL 95*  CO2 34*  GLUCOSE 90  BUN 17  CREATININE 0.88  CALCIUM 8.6*   Cardiac Enzymes  Recent Labs Lab 02/27/16 1617  TROPONINI <0.03   RADIOLOGY:  Ct Head Wo Contrast  Result Date: 02/27/2016 CLINICAL DATA:  Multiple falls, hypoxia, head injury EXAM: CT HEAD WITHOUT CONTRAST CT  CERVICAL SPINE WITHOUT CONTRAST TECHNIQUE: Multidetector CT imaging of the head and cervical spine was performed following the standard protocol without intravenous contrast. Multiplanar CT image reconstructions of the cervical spine were also generated. COMPARISON:  07/22/2015 FINDINGS: CT HEAD FINDINGS Motion degraded  images. Brain: No evidence of acute infarction, hemorrhage, hydrocephalus, extra-axial collection or mass lesion/mass effect. Stable subcortical hypodensity in the right frontal lobe, chronic. Subcortical white matter and periventricular small vessel ischemic changes. Intracranial atherosclerosis. Stable scattered cortical calcifications in the right hemisphere, chronic. Vascular: No hyperdense vessel or unexpected calcification. Skull: No evidence of calvarial fracture. Stable postsurgical changes along the posterior occipital region. Sinuses/Orbits: The visualized paranasal sinuses are essentially clear. The mastoid air cells are unopacified. Other: Cerebral volume is within normal limits. No ventriculomegaly. CT CERVICAL SPINE FINDINGS Motion degraded images. Alignment: Straightening of the cervical spine. Stable posterior cervical fixation hardware extending from the occiput foot through T2. Stable lucency involving the T2 pedicle screws. No evidence of hardware fracture. Skull base and vertebrae: No acute fracture. No primary bone lesion or focal pathologic process. Soft tissues and spinal canal: No prevertebral fluid or swelling. No visible canal hematoma. Disc levels:  Moderate degenerative changes at C3-4 and C4-5. Spinal canal remains patent. Upper chest: Visualized lung apices are clear. Other: Visualized thyroid is unremarkable. IMPRESSION: No evidence of acute intracranial abnormality. Small vessel ischemic changes. No evidence of traumatic injury to the cervical spine. Stable postsurgical changes, as above. Moderate degenerative changes of the mid cervical spine. Electronically Signed   By: Charline Bills M.D.   On: 02/27/2016 17:21   Ct Cervical Spine Wo Contrast  Result Date: 02/27/2016 CLINICAL DATA:  Multiple falls, hypoxia, head injury EXAM: CT HEAD WITHOUT CONTRAST CT CERVICAL SPINE WITHOUT CONTRAST TECHNIQUE: Multidetector CT imaging of the head and cervical spine was performed following  the standard protocol without intravenous contrast. Multiplanar CT image reconstructions of the cervical spine were also generated. COMPARISON:  07/22/2015 FINDINGS: CT HEAD FINDINGS Motion degraded images. Brain: No evidence of acute infarction, hemorrhage, hydrocephalus, extra-axial collection or mass lesion/mass effect. Stable subcortical hypodensity in the right frontal lobe, chronic. Subcortical white matter and periventricular small vessel ischemic changes. Intracranial atherosclerosis. Stable scattered cortical calcifications in the right hemisphere, chronic. Vascular: No hyperdense vessel or unexpected calcification. Skull: No evidence of calvarial fracture. Stable postsurgical changes along the posterior occipital region. Sinuses/Orbits: The visualized paranasal sinuses are essentially clear. The mastoid air cells are unopacified. Other: Cerebral volume is within normal limits. No ventriculomegaly. CT CERVICAL SPINE FINDINGS Motion degraded images. Alignment: Straightening of the cervical spine. Stable posterior cervical fixation hardware extending from the occiput foot through T2. Stable lucency involving the T2 pedicle screws. No evidence of hardware fracture. Skull base and vertebrae: No acute fracture. No primary bone lesion or focal pathologic process. Soft tissues and spinal canal: No prevertebral fluid or swelling. No visible canal hematoma. Disc levels:  Moderate degenerative changes at C3-4 and C4-5. Spinal canal remains patent. Upper chest: Visualized lung apices are clear. Other: Visualized thyroid is unremarkable. IMPRESSION: No evidence of acute intracranial abnormality. Small vessel ischemic changes. No evidence of traumatic injury to the cervical spine. Stable postsurgical changes, as above. Moderate degenerative changes of the mid cervical spine. Electronically Signed   By: Charline Bills M.D.   On: 02/27/2016 17:21   Dg Chest Port 1 View  Result Date: 02/27/2016 CLINICAL DATA:   Shortness of breath, multiple falls in past couple weeks, history hypertension, COPD EXAM: PORTABLE CHEST 1 VIEW COMPARISON:  Portable exam 1708 hours compared to 11/02/2015 FINDINGS: Enlargement of cardiac silhouette. Atherosclerotic calcification aorta. Mild pulmonary vascular congestion. Chronic elevation of RIGHT diaphragm with bowel interposition. Lungs grossly clear. No pleural effusion or pneumothorax. Bones demineralized. Prior cervical thoracic fusion. LEFT glenohumeral degenerative changes. IMPRESSION: Enlargement of cardiac silhouette with pulmonary vascular congestion. No acute infiltrate. Aortic atherosclerosis. Electronically Signed   By: Ulyses Southward M.D.   On: 02/27/2016 17:29   Dg Hip Unilat With Pelvis 2-3 Views Left  Result Date: 02/28/2016 CLINICAL DATA:  Left hip pain after multiple falls at home yesterday. EXAM: DG HIP (WITH OR WITHOUT PELVIS) 2-3V LEFT COMPARISON:  None. FINDINGS: There is no evidence of hip fracture or dislocation. There is no evidence of arthropathy or other focal bone abnormality. IMPRESSION: Normal left hip. Electronically Signed   By: Lupita Raider, M.D.   On: 02/28/2016 13:20   ASSESSMENT AND PLAN:  *Respiratory failure with hypoxia and hypercapnia (HCC)  -pt was placed on BiPAP initially as well as oxygen supplementation with good results. -wean off BiPAP when necessary if he continues to need it, O2 supplementation for now, repeat  blood gas shows improvement -appears pt took extra valium accidentally -uses oxygen at nite -assess for using during daytime before d/c  * Essential hypertension - continue home meds  *  Hypothyroidism - home dose thyroid replacement  *  Fall - likely due to benzo use -left hip pain--XRay left hip neg for fracture -PT to see pt   *COPD (HCC) - continue home inhalers  Pt wants to go home but has contusion left hip from fall. Will get PT evaluation prior to d/c Transfer to Med floor  Case discussed with Care  Management/Social Worker. Management plans discussed with the patient, family and they are in agreement.  CODE STATUS: FULL  DVT Prophylaxis: Lovenox  TOTAL TIME TAKING CARE OF THIS PATIENT: 30 minutes.  >50% time spent on counselling and coordination of care  POSSIBLE D/C IN 1 DAYS, DEPENDING ON CLINICAL CONDITION.  Note: This dictation was prepared with Dragon dictation along with smaller phrase technology. Any transcriptional errors that result from this process are unintentional.  Torrance Stockley M.D on 02/28/2016 at 2:37 PM  Between 7am to 6pm - Pager - [phone]  After 6pm go to [email or web address] - password [email or web address] [facility]  [facility]  Office  [phone]  CC: Primary care physician; Dorothey Baseman, MD

## 2016-02-29 NOTE — Progress Notes (Signed)
Pt"s ride home arrived at 1640.This Clinical research associatewriter reviewed d/c instructions with pt, explaining changes to respiratory medications. Pt verbally indicated he understood. Pt received the d/c instructions and signed paperwork, was escorted to entrance of facility via wc and waiting car. This Clinical research associatewriter [facility].Pt did not appear to be sob.

## 2016-02-29 NOTE — Plan of Care (Signed)
Problem: Bowel/Gastric: Goal: Will not experience complications related to bowel motility Outcome: Adequate for Discharge Pt is to be dc'd today. Physical therapy evaluated pt, pt is awaiting ride home.

## 2016-02-29 NOTE — Progress Notes (Signed)
Shift assessment completed at 0740. Pt is alert and oriented, sitting up in bed. [facility] at [facility], lungs are decreased throughout, pt denied feeling sob. Monitor in place,SR noted. Abdomen is soft, bs heard. Pt has urinal in reach. PPP, trace pitting edema noted to l ankle. PIV #20 intact to LAC, site is free of redness and swelling. Dr. Quentin CornwallSanani has been in on rounds, pt to be dc'd today based on feedback from physical therapy eval. Pt is noted ot have a large scab on his R knee, and an abrasion to his upper forehead, multiple bruises to bilat arms. Srx2, call bell in reach.

## 2016-02-29 NOTE — Evaluation (Signed)
Physical Therapy Evaluation Patient Details Name: Chad Huffman MRN: [id number] DOB: 22-Sep-1945 Today's Date: 02/29/2016   History of Present Illness  Chad Huffman  is a 70 y.o. male who presents with Altered mental status and respiratory failure. Patient's family states that the patient sometimes will take Valium and Benadryl to help him sleep. As a patient became more cognizant that he felt he may have taken "too much Valium". However, he does not recall an exact dose of what he took. In the ED he was placed on BiPAP initially found to be hypercarbic.  He was also initially hypoxic, this corrected with supplement oxygen. Patient reports increased left hip discomfort secondary to fall.   Clinical Impression  70 yo Male lives alone, came to ED after falling and having AMS. Patient reports being mod I for self care prior to admittance. He was able to walk inside his home without AD. Patient does report increased episodes of falls. He is currently supervision for bed mobility, CGA for sit<>Stand transfers. He ambulated 80 feet with RW, CGA demonstrating slower gait speed but reciprocal gait pattern. Patient does report increased left hip pain during swing phase of gait. He denies any pain when sitting or lying down. He demonstrates 4/5 BLE strength. Patient denies any numbness/tingling. He does have trouble with dynamic sitting and with standing balance. He would benefit from additional skilled PT Intervention to improve balance/gait safety.     Follow Up Recommendations Home health PT;Supervision - Intermittent    Equipment Recommendations  None recommended by PT    Recommendations for Other Services Rehab consult     Precautions / Restrictions Precautions Precautions: Fall Restrictions Weight Bearing Restrictions: No      Mobility  Bed Mobility Overal bed mobility: Needs Assistance Bed Mobility: Supine to Sit     Supine to sit: Supervision     General bed mobility comments: with  elevated head of bed and bed rail; required cues for LE positioning and to use rail to pull self up into sitting;   Transfers Overall transfer level: Needs assistance Equipment used: Rolling walker (2 wheeled) Transfers: Sit to/from Stand Sit to Stand: Min guard         General transfer comment: with cues for hand placement and to increase forward lean for better transfer ability;   Ambulation/Gait Ambulation/Gait assistance: Min guard Ambulation Distance (Feet): 80 Feet Assistive device: Rolling walker (2 wheeled) Gait Pattern/deviations: Step-through pattern;Decreased dorsiflexion - right;Decreased dorsiflexion - left;Narrow base of support Gait velocity: decreased   General Gait Details: decreased gait speed; required cues to increase erect posture and to increase step length bilaterally; patient reports LLE hip pain during swing phase but less pain during stance; no antalgic gait noted;   Stairs            Wheelchair Mobility    Modified Rankin (Stroke Patients Only)       Balance Overall balance assessment: Needs assistance Sitting-balance support: Bilateral upper extremity supported;Feet supported Sitting balance-Leahy Scale: Fair Sitting balance - Comments: demonstrates posterior lean during dynamic movement; static sitting balance is good;  Postural control: Posterior lean Standing balance support: Bilateral upper extremity supported Standing balance-Leahy Scale: Fair Standing balance comment: requires CGA for safety;                              Pertinent Vitals/Pain Pain Assessment: 0-10 Pain Score: 8  Pain Location: no pain at rest; 8/10 pain on lateral hip  with weight bearing tasks;  Pain Descriptors / Indicators: Sore Pain Intervention(s): Limited activity within patient's tolerance;Monitored during session;Repositioned    Home Living Family/patient expects to be discharged to:: Private residence Living Arrangements: Alone Available  Help at Discharge: Friend(s) (only part time; ) Type of Home: House Home Access: Stairs to enter Entrance Stairs-Rails: Right;Left;Can reach both Entrance Stairs-Number of Steps: 4 Home Layout: One level Home Equipment: Walker - 2 wheels;Cane - single point;Bedside commode;Shower seat      Prior Function Level of Independence: Independent with assistive device(s)         Comments: used cane primarly; had gotten to where he could walk in home without AD, uses cane when outside of home.      Hand Dominance        Extremity/Trunk Assessment   Upper Extremity Assessment: Overall WFL for tasks assessed           Lower Extremity Assessment: RLE deficits/detail;LLE deficits/detail RLE Deficits / Details: ROM is WFL; gross strength 4/5; intact light touch sensation; has a scab on right anterior knee;  LLE Deficits / Details: ROM is WFL; gross strength 4/5; intact light touch sensation;   Cervical / Trunk Assessment: Kyphotic  Communication   Communication: No difficulties  Cognition Arousal/Alertness: Awake/alert Behavior During Therapy: WFL for tasks assessed/performed Overall Cognitive Status: Within Functional Limits for tasks assessed                      General Comments General comments (skin integrity, edema, etc.): large scab wound on right anterior knee; also has a red bruise on top of head;     Exercises        Assessment/Plan    PT Assessment Patient needs continued PT services  PT Diagnosis Difficulty walking;Generalized weakness   PT Problem List Decreased strength;Decreased activity tolerance;Decreased balance;Decreased mobility;Decreased safety awareness  PT Treatment Interventions DME instruction;Gait training;Stair training;Functional mobility training;Neuromuscular re-education;Balance training;Therapeutic exercise;Therapeutic activities;Patient/family education   PT Goals (Current goals can be found in the Care Plan section) Acute Rehab PT  Goals Patient Stated Goal: "I want to reduce my left hip pain." PT Goal Formulation: With patient Time For Goal Achievement: 03/14/16 Potential to Achieve Goals: Good    Frequency Min 2X/week   Barriers to discharge Decreased caregiver support has a friend that can check on him but nobody consistently can stay with him;     Co-evaluation               End of Session Equipment Utilized During Treatment: Gait belt Activity Tolerance: Patient tolerated treatment well Patient left: in chair;with chair alarm set;with call bell/phone within reach Nurse Communication: Mobility status         Time: 1138-1206 PT Time Calculation (min) (ACUTE ONLY): 28 min   Charges:   PT Evaluation $PT Eval Low Complexity: 1 Procedure     PT G Codes:         Inocencia Murtaugh PT, DPT 02/29/2016, 12:47 PM

## 2016-02-29 NOTE — Care Management Note (Signed)
Case Management Note  Patient Details  Name: Chad Huffman MRN: [id number] Date of Birth: [birth year]  Subjective/Objective:      Discussed discharge planning with [name]. He has used [facility] in the past and chose them again to be his home health provider for HH-PT, RN, Nurse Aide. A referral was faxed to [facility]. [name] verbalized understanding to ask [facility] about any co-pays when they call to set up an appointment. [name] has chronic oxygen at home and family will need to bring a portable tank from home for him to use during transport home.               Action/Plan:   Expected Discharge Date:                  Expected Discharge Plan:     In-House Referral:     Discharge planning Services     Post Acute Care Choice:    Choice offered to:     DME Arranged:    DME Agency:     HH Arranged:    HH Agency:     Status of Service:     If discussed at [employer] Length of Stay Meetings, dates discussed:    Additional Comments:  Dessiree Sze A, RN 02/29/2016, 12:31 PM

## 2016-02-29 NOTE — Discharge Summary (Signed)
[employer] [facility] [place] [facility]   PATIENT NAME: Chad Huffman    MR#:  [id number]  DATE OF BIRTH:  1946-02-14  DATE OF ADMISSION:  02/27/2016 ADMITTING PHYSICIAN: Oralia Manis, MD  DATE OF DISCHARGE: 02/29/2016  PRIMARY CARE PHYSICIAN: Dorothey Baseman, MD    ADMISSION DIAGNOSIS:  SOB (shortness of breath) [R06.02] Acute respiratory failure with hypercapnia (HCC) [J96.02] Altered mental status, unspecified altered mental status type [R41.82]  DISCHARGE DIAGNOSIS:  Principal Problem:   Respiratory failure with hypoxia and hypercapnia (HCC) Active Problems:   Essential hypertension   Hypothyroidism   Fall   COPD exacerbation (HCC)   SECONDARY DIAGNOSIS:   Past Medical History:  Diagnosis Date  . Antalgic gait   . Carotid artery occlusion   . Chronic neck and back pain   . Complication of anesthesia    pt has had cervical fusion-limited neck flextion  . COPD (chronic obstructive pulmonary disease) (HCC)   . Depression   . DJD (degenerative joint disease)    Right shoulder  . Hypertension   . Lumbago   . Peripheral neuropathy [facility])     HOSPITAL COURSE:   70 year old male with past medical history of COPD, depression, DJD, hypertension, peripheral neuropathy, peripheral vascular disease presented to the hospital due to shortness of breath and noted to be in acute respiratory failure with hypoxia and hypercapnia.  1. Acute respiratory failure with hypoxia and hypercapnia-this was secondary to COPD combined with increasing uses a Valium at home. -Patient was placed on BiPAP has hypercapnia since then is significantly improved. He did not have any evidence of COPD exacerbation. -He has been weaned off the BiPAP and is currently on nasal cannula and is clinically doing well and therefore being discharged home.  2. Left hip pain-patient had a recent fall prior to admission. -His x-ray of his left hip shows no evidence of fracture. He was seen by physical  therapy recommended home health services which is being arranged for him prior to discharge.  3. Essential hypertension-patient will continue his lisinopril.  4. Hypothyroidism-patient will resume his Synthroid.  5. COPD-no acute exacerbation. -Continue duo nebs, Symbicort.   DISCHARGE CONDITIONS:   Stable.   CONSULTS OBTAINED:    DRUG ALLERGIES:   Allergies  Allergen Reactions  . Ampicillin Swelling and Other (See Comments)    Unable to obtain enough information to answer additional questions about this medication.  Has patient had a PCN reaction causing immediate rash, facial/tongue/throat swelling, SOB or lightheadedness with hypotension: Yes Has patient had a PCN reaction causing severe rash involving mucus membranes or skin necrosis: No Has patient had a PCN reaction that required hospitalization No Has patient had a PCN reaction occurring within the last 10 years: Yes If all of the above answers are "NO", then may pr  . Nortriptyline Other (See Comments)    Sleep walking  . Brimonidine Other (See Comments)    Reaction:  Unknown   . Cyclobenzaprine Other (See Comments)    Reaction: Hallucinations  . Tizanidine Other (See Comments)    Reaction: Hallucinations  . Gabapentin Rash    DISCHARGE MEDICATIONS:     Medication List    TAKE these medications   ALPRAZolam 0.5 MG tablet Commonly known as:  XANAX Take 0.25-0.5 mg by mouth every 8 (eight) hours as needed for anxiety.   aspirin EC 81 MG tablet Take 81 mg by mouth daily.   baclofen 20 MG tablet Commonly known as:  LIORESAL Take 20 mg by  mouth 3 (three) times daily as needed. For muscle spasms.   budesonide-formoterol 160-4.5 MCG/ACT inhaler Commonly known as:  SYMBICORT Inhale 2 puffs into the lungs 2 (two) times daily.   diazepam 2 MG tablet Commonly known as:  VALIUM Take 1 tablet (2 mg total) by mouth every 8 (eight) hours as needed for muscle spasms.   etodolac 500 MG tablet Commonly known as:   LODINE Take 500 mg by mouth 2 (two) times daily.   furosemide 40 MG tablet Commonly known as:  LASIX Take 1 tablet (40 mg total) by mouth 2 (two) times daily.   ipratropium-albuterol 0.5-2.5 (3) MG/3ML Soln Commonly known as:  DUONEB Take 3 mLs by nebulization every 6 (six) hours. What changed:  when to take this  reasons to take this   Ipratropium-Albuterol 20-100 MCG/ACT Aers respimat Commonly known as:  COMBIVENT RESPIMAT Inhale 1 puff into the lungs every 6 (six) hours. What changed:  Another medication with the same name was changed. Make sure you understand how and when to take each.   ketoconazole 2 % shampoo Commonly known as:  NIZORAL Apply 1 application topically 2 (two) times a week. Leave on 5 minutes then rinse well.   levothyroxine 125 MCG tablet Commonly known as:  SYNTHROID, LEVOTHROID Take 1 tablet (125 mcg total) by mouth daily before breakfast.   lisinopril 10 MG tablet Commonly known as:  PRINIVIL,ZESTRIL Take 10 mg by mouth daily.   mirtazapine 15 MG tablet Commonly known as:  REMERON Take 15 mg by mouth at bedtime.   multivitamin with minerals Tabs tablet Take 1 tablet by mouth daily.   omeprazole 20 MG capsule Commonly known as:  PRILOSEC Take 20 mg by mouth daily.   oxyCODONE 5 MG immediate release tablet Commonly known as:  ROXICODONE Take 1 tablet (5 mg total) by mouth every 8 (eight) hours as needed.   sertraline 100 MG tablet Commonly known as:  ZOLOFT Take 100 mg by mouth daily.         DISCHARGE INSTRUCTIONS:   DIET:  Cardiac diet  DISCHARGE CONDITION:  Stable  ACTIVITY:  Activity as tolerated  OXYGEN:  Home Oxygen: Yes.     Oxygen Delivery: 2 liters/min via Patient connected to nasal cannula oxygen  DISCHARGE LOCATION:  Home with home health nursing, physical therapy, home health aide   If you experience worsening of your admission symptoms, develop shortness of breath, life threatening emergency, suicidal or  homicidal thoughts you must seek medical attention immediately by calling 911 or calling your MD immediately  if symptoms less severe.  You Must read complete instructions/literature along with all the possible adverse reactions/side effects for all the Medicines you take and that have been prescribed to you. Take any new Medicines after you have completely understood and accpet all the possible adverse reactions/side effects.   Please note  You were cared for by a hospitalist during your hospital stay. If you have any questions about your discharge medications or the care you received while you were in the hospital after you are discharged, you can call the unit and asked to speak with the hospitalist on call if the hospitalist that took care of you is not available. Once you are discharged, your primary care physician will handle any further medical issues. Please note that NO REFILLS for any discharge medications will be authorized once you are discharged, as it is imperative that you return to your primary care physician (or establish a relationship with a primary  care physician if you do not have one) for your aftercare needs so that they can reassess your need for medications and monitor your lab values.     Today   Still having some left hip pain. No shortness of breath, nausea, vomiting or any chest pain.  VITAL SIGNS:  Blood pressure 113/72, pulse (!) 104, temperature 98.3 F (36.8 C), temperature source Oral, resp. rate 20, height 5\' 8"  (1.727 m), weight 105.1 kg (231 lb 12.8 oz), SpO2 91 %.  I/O:   Intake/Output Summary (Last 24 hours) at 02/29/16 1230 Last data filed at 02/29/16 1021  Gross per 24 hour  Intake              480 ml  Output              400 ml  Net               80 ml    PHYSICAL EXAMINATION:  GENERAL:  70 y.o.-year-old patient lying in the bed in no acute distress.  EYES: Pupils equal, round, reactive to light and accommodation. No scleral icterus.  Extraocular muscles intact.  HEENT: Head atraumatic, normocephalic. Oropharynx and nasopharynx clear.  NECK:  Supple, no jugular venous distention. No thyroid enlargement, no tenderness.  LUNGS: Normal breath sounds bilaterally, no wheezing, rales,rhonchi. No use of accessory muscles of respiration.  CARDIOVASCULAR: S1, S2 normal. No murmurs, rubs, or gallops.  ABDOMEN: Soft, non-tender, non-distended. Bowel sounds present. No organomegaly or mass.  EXTREMITIES: No pedal edema, cyanosis, or clubbing. Left hip pain but no erythema or bruising noted. NEUROLOGIC: Cranial nerves II through XII are intact. No focal motor or sensory defecits b/l.  PSYCHIATRIC: The patient is alert and oriented x 3. Good affect.  SKIN: No obvious rash, lesion, or ulcer.   DATA REVIEW:   CBC  Recent Labs Lab 02/28/16 0414  WBC 7.0  HGB 9.8*  HCT 29.0*  PLT 137*    Chemistries   Recent Labs Lab 02/28/16 0414  NA 135  K 4.0  CL 95*  CO2 34*  GLUCOSE 90  BUN 17  CREATININE 0.88  CALCIUM 8.6*    Cardiac Enzymes  Recent Labs Lab 02/27/16 1617  TROPONINI <0.03    Microbiology Results  Results for orders placed or performed during the hospital encounter of 02/27/16  MRSA PCR Screening     Status: None   Collection Time: 02/27/16 10:57 PM  Result Value Ref Range Status   MRSA by PCR NEGATIVE NEGATIVE Final    Comment:        The GeneXpert MRSA Assay (FDA approved for NASAL specimens only), is one component of a comprehensive MRSA colonization surveillance program. It is not intended to diagnose MRSA infection nor to guide or monitor treatment for MRSA infections.     RADIOLOGY:  Ct Head Wo Contrast  Result Date: 02/27/2016 CLINICAL DATA:  Multiple falls, hypoxia, head injury EXAM: CT HEAD WITHOUT CONTRAST CT CERVICAL SPINE WITHOUT CONTRAST TECHNIQUE: Multidetector CT imaging of the head and cervical spine was performed following the standard protocol without intravenous contrast.  Multiplanar CT image reconstructions of the cervical spine were also generated. COMPARISON:  07/22/2015 FINDINGS: CT HEAD FINDINGS Motion degraded images. Brain: No evidence of acute infarction, hemorrhage, hydrocephalus, extra-axial collection or mass lesion/mass effect. Stable subcortical hypodensity in the right frontal lobe, chronic. Subcortical white matter and periventricular small vessel ischemic changes. Intracranial atherosclerosis. Stable scattered cortical calcifications in the right hemisphere, chronic. Vascular: No hyperdense vessel or  unexpected calcification. Skull: No evidence of calvarial fracture. Stable postsurgical changes along the posterior occipital region. Sinuses/Orbits: The visualized paranasal sinuses are essentially clear. The mastoid air cells are unopacified. Other: Cerebral volume is within normal limits. No ventriculomegaly. CT CERVICAL SPINE FINDINGS Motion degraded images. Alignment: Straightening of the cervical spine. Stable posterior cervical fixation hardware extending from the occiput foot through T2. Stable lucency involving the T2 pedicle screws. No evidence of hardware fracture. Skull base and vertebrae: No acute fracture. No primary bone lesion or focal pathologic process. Soft tissues and spinal canal: No prevertebral fluid or swelling. No visible canal hematoma. Disc levels:  Moderate degenerative changes at C3-4 and C4-5. Spinal canal remains patent. Upper chest: Visualized lung apices are clear. Other: Visualized thyroid is unremarkable. IMPRESSION: No evidence of acute intracranial abnormality. Small vessel ischemic changes. No evidence of traumatic injury to the cervical spine. Stable postsurgical changes, as above. Moderate degenerative changes of the mid cervical spine. Electronically Signed   By: Charline Bills M.D.   On: 02/27/2016 17:21   Ct Cervical Spine Wo Contrast  Result Date: 02/27/2016 CLINICAL DATA:  Multiple falls, hypoxia, head injury EXAM: CT  HEAD WITHOUT CONTRAST CT CERVICAL SPINE WITHOUT CONTRAST TECHNIQUE: Multidetector CT imaging of the head and cervical spine was performed following the standard protocol without intravenous contrast. Multiplanar CT image reconstructions of the cervical spine were also generated. COMPARISON:  07/22/2015 FINDINGS: CT HEAD FINDINGS Motion degraded images. Brain: No evidence of acute infarction, hemorrhage, hydrocephalus, extra-axial collection or mass lesion/mass effect. Stable subcortical hypodensity in the right frontal lobe, chronic. Subcortical white matter and periventricular small vessel ischemic changes. Intracranial atherosclerosis. Stable scattered cortical calcifications in the right hemisphere, chronic. Vascular: No hyperdense vessel or unexpected calcification. Skull: No evidence of calvarial fracture. Stable postsurgical changes along the posterior occipital region. Sinuses/Orbits: The visualized paranasal sinuses are essentially clear. The mastoid air cells are unopacified. Other: Cerebral volume is within normal limits. No ventriculomegaly. CT CERVICAL SPINE FINDINGS Motion degraded images. Alignment: Straightening of the cervical spine. Stable posterior cervical fixation hardware extending from the occiput foot through T2. Stable lucency involving the T2 pedicle screws. No evidence of hardware fracture. Skull base and vertebrae: No acute fracture. No primary bone lesion or focal pathologic process. Soft tissues and spinal canal: No prevertebral fluid or swelling. No visible canal hematoma. Disc levels:  Moderate degenerative changes at C3-4 and C4-5. Spinal canal remains patent. Upper chest: Visualized lung apices are clear. Other: Visualized thyroid is unremarkable. IMPRESSION: No evidence of acute intracranial abnormality. Small vessel ischemic changes. No evidence of traumatic injury to the cervical spine. Stable postsurgical changes, as above. Moderate degenerative changes of the mid cervical spine.  Electronically Signed   By: Charline Bills M.D.   On: 02/27/2016 17:21   Dg Chest Port 1 View  Result Date: 02/27/2016 CLINICAL DATA:  Shortness of breath, multiple falls in past couple weeks, history hypertension, COPD EXAM: PORTABLE CHEST 1 VIEW COMPARISON:  Portable exam 1708 hours compared to 11/02/2015 FINDINGS: Enlargement of cardiac silhouette. Atherosclerotic calcification aorta. Mild pulmonary vascular congestion. Chronic elevation of RIGHT diaphragm with bowel interposition. Lungs grossly clear. No pleural effusion or pneumothorax. Bones demineralized. Prior cervical thoracic fusion. LEFT glenohumeral degenerative changes. IMPRESSION: Enlargement of cardiac silhouette with pulmonary vascular congestion. No acute infiltrate. Aortic atherosclerosis. Electronically Signed   By: Ulyses Southward M.D.   On: 02/27/2016 17:29   Dg Hip Unilat With Pelvis 2-3 Views Left  Result Date: 02/28/2016 CLINICAL DATA:  Left hip  pain after multiple falls at home yesterday. EXAM: DG HIP (WITH OR WITHOUT PELVIS) 2-3V LEFT COMPARISON:  None. FINDINGS: There is no evidence of hip fracture or dislocation. There is no evidence of arthropathy or other focal bone abnormality. IMPRESSION: Normal left hip. Electronically Signed   By: Lupita Raider, M.D.   On: 02/28/2016 13:20      Management plans discussed with the patient, family and they are in agreement.  CODE STATUS:     Code Status Orders        Start     Ordered   02/27/16 2241  Full code  Continuous     02/27/16 2240    Code Status History    Date Active Date Inactive Code Status Order ID Comments User Context   07/22/2015  7:30 PM 07/26/2015 12:16 AM Full Code [id number]  Adrian Saran, MD Inpatient   02/11/2015 10:28 AM 02/11/2015  6:15 PM Full Code [id number]  Nada Libman, MD Inpatient   08/04/2014  8:13 PM 08/13/2014  2:53 PM Full Code [id number]  Lynden Oxford, MD Inpatient    Advance Directive Documentation   Flowsheet Row Most Recent Value  Type of  Advance Directive  Living will  Pre-existing out of facility DNR order (yellow form or pink MOST form)  No data  "MOST" Form in Place?  No data      TOTAL TIME TAKING CARE OF THIS PATIENT: 40 minutes.    Houston Siren M.D on 02/29/2016 at 12:30 PM  Between 7am to 6pm - Pager - [phone]  After 6pm go to [email or web address] - Social research officer, government  [employer] [facility]  Office  [phone]  CC: Primary care physician; Dorothey Baseman, MD

## 2016-03-03 ENCOUNTER — Emergency Department: Payer: Medicare Other | Admitting: Certified Registered Nurse Anesthetist

## 2016-03-03 ENCOUNTER — Encounter
Admission: EM | Disposition: A | Discharge status: Nursing Home (Includes ICF) | Payer: Self-pay | Source: Home / Self Care | Attending: Internal Medicine

## 2016-03-03 ENCOUNTER — Encounter: Payer: Self-pay | Admitting: Emergency Medicine

## 2016-03-03 ENCOUNTER — Inpatient Hospital Stay
Admission: EM | Admit: 2016-03-03 | Discharge: 2016-03-09 | DRG: 004 | Disposition: A | Discharge status: Other Acute Inpatient Hospital | Payer: Medicare Other | Attending: Internal Medicine | Admitting: Internal Medicine

## 2016-03-03 ENCOUNTER — Emergency Department: Payer: Medicare Other

## 2016-03-03 DIAGNOSIS — G629 Polyneuropathy, unspecified: Secondary | ICD-10-CM | POA: Diagnosis present

## 2016-03-03 DIAGNOSIS — J9602 Acute respiratory failure with hypercapnia: Secondary | ICD-10-CM | POA: Diagnosis present

## 2016-03-03 DIAGNOSIS — Z4659 Encounter for fitting and adjustment of other gastrointestinal appliance and device: Secondary | ICD-10-CM

## 2016-03-03 DIAGNOSIS — I10 Essential (primary) hypertension: Secondary | ICD-10-CM | POA: Diagnosis present

## 2016-03-03 DIAGNOSIS — T884XXA Failed or difficult intubation, initial encounter: Secondary | ICD-10-CM | POA: Diagnosis not present

## 2016-03-03 DIAGNOSIS — J189 Pneumonia, unspecified organism: Secondary | ICD-10-CM | POA: Diagnosis not present

## 2016-03-03 DIAGNOSIS — Z79899 Other long term (current) drug therapy: Secondary | ICD-10-CM | POA: Diagnosis not present

## 2016-03-03 DIAGNOSIS — J9690 Respiratory failure, unspecified, unspecified whether with hypoxia or hypercapnia: Secondary | ICD-10-CM | POA: Diagnosis present

## 2016-03-03 DIAGNOSIS — Z7982 Long term (current) use of aspirin: Secondary | ICD-10-CM

## 2016-03-03 DIAGNOSIS — J441 Chronic obstructive pulmonary disease with (acute) exacerbation: Secondary | ICD-10-CM | POA: Diagnosis present

## 2016-03-03 DIAGNOSIS — Z87891 Personal history of nicotine dependence: Secondary | ICD-10-CM

## 2016-03-03 DIAGNOSIS — E039 Hypothyroidism, unspecified: Secondary | ICD-10-CM | POA: Diagnosis present

## 2016-03-03 DIAGNOSIS — Y849 Medical procedure, unspecified as the cause of abnormal reaction of the patient, or of later complication, without mention of misadventure at the time of the procedure: Secondary | ICD-10-CM | POA: Diagnosis not present

## 2016-03-03 DIAGNOSIS — J690 Pneumonitis due to inhalation of food and vomit: Secondary | ICD-10-CM | POA: Diagnosis not present

## 2016-03-03 DIAGNOSIS — J440 Chronic obstructive pulmonary disease with acute lower respiratory infection: Secondary | ICD-10-CM | POA: Diagnosis present

## 2016-03-03 DIAGNOSIS — R401 Stupor: Secondary | ICD-10-CM

## 2016-03-03 DIAGNOSIS — J9600 Acute respiratory failure, unspecified whether with hypoxia or hypercapnia: Secondary | ICD-10-CM

## 2016-03-03 DIAGNOSIS — G931 Anoxic brain damage, not elsewhere classified: Secondary | ICD-10-CM | POA: Diagnosis present

## 2016-03-03 DIAGNOSIS — R402432 Glasgow coma scale score 3-8, at arrival to emergency department: Secondary | ICD-10-CM | POA: Diagnosis present

## 2016-03-03 DIAGNOSIS — W19XXXA Unspecified fall, initial encounter: Secondary | ICD-10-CM

## 2016-03-03 DIAGNOSIS — Z01818 Encounter for other preprocedural examination: Secondary | ICD-10-CM

## 2016-03-03 DIAGNOSIS — F1010 Alcohol abuse, uncomplicated: Secondary | ICD-10-CM | POA: Diagnosis not present

## 2016-03-03 DIAGNOSIS — Z452 Encounter for adjustment and management of vascular access device: Secondary | ICD-10-CM

## 2016-03-03 DIAGNOSIS — F329 Major depressive disorder, single episode, unspecified: Secondary | ICD-10-CM | POA: Diagnosis present

## 2016-03-03 DIAGNOSIS — J9601 Acute respiratory failure with hypoxia: Secondary | ICD-10-CM | POA: Diagnosis present

## 2016-03-03 DIAGNOSIS — T68XXXA Hypothermia, initial encounter: Secondary | ICD-10-CM

## 2016-03-03 DIAGNOSIS — S8010XA Contusion of unspecified lower leg, initial encounter: Secondary | ICD-10-CM

## 2016-03-03 DIAGNOSIS — G9341 Metabolic encephalopathy: Secondary | ICD-10-CM | POA: Diagnosis not present

## 2016-03-03 HISTORY — PX: TRACHEOSTOMY TUBE PLACEMENT: SHX814

## 2016-03-03 LAB — COMPREHENSIVE METABOLIC PANEL WITH GFR
ALT: 14 U/L — ABNORMAL LOW (ref 17–63)
AST: 25 U/L (ref 15–41)
Albumin: 2.7 g/dL — ABNORMAL LOW (ref 3.5–5.0)
Alkaline Phosphatase: 42 U/L (ref 38–126)
Anion gap: 4 — ABNORMAL LOW (ref 5–15)
BUN: 42 mg/dL — ABNORMAL HIGH (ref 6–20)
CO2: 26 mmol/L (ref 22–32)
Calcium: 6.3 mg/dL — CL (ref 8.9–10.3)
Chloride: 106 mmol/L (ref 101–111)
Creatinine, Ser: 1.03 mg/dL (ref 0.61–1.24)
GFR calc Af Amer: >60 mL/min
GFR calc non Af Amer: >60 mL/min
Glucose, Bld: 101 mg/dL — ABNORMAL HIGH (ref 65–99)
Potassium: 3.3 mmol/L — ABNORMAL LOW (ref 3.5–5.1)
Sodium: 136 mmol/L (ref 135–145)
Total Bilirubin: 0.9 mg/dL (ref 0.3–1.2)
Total Protein: 5.2 g/dL — ABNORMAL LOW (ref 6.5–8.1)

## 2016-03-03 LAB — BLOOD GAS, ARTERIAL
ACID-BASE EXCESS: 3 mmol/L — AB (ref 0.0–2.0)
Bicarbonate: 31.7 mmol/L — ABNORMAL HIGH (ref 20.0–28.0)
FIO2: 1
LHR: 14 {breaths}/min
MECHVT: 500 mL
Mechanical Rate: 14
O2 SAT: 88.8 %
PATIENT TEMPERATURE: 33.3
PCO2 ART: 63 mmHg — AB (ref 32.0–48.0)
PEEP/CPAP: 10 cmH2O
PH ART: 7.29 — AB (ref 7.350–7.450)
PO2 ART: 51 mmHg — AB (ref 83.0–108.0)

## 2016-03-03 LAB — URINALYSIS COMPLETE WITH MICROSCOPIC (ARMC ONLY)
Bilirubin Urine: NEGATIVE
Glucose, UA: NEGATIVE mg/dL
Ketones, ur: NEGATIVE mg/dL
Nitrite: NEGATIVE
Protein, ur: 30 mg/dL — AB
Specific Gravity, Urine: 1.015 (ref 1.005–1.030)
pH: 5 (ref 5.0–8.0)

## 2016-03-03 LAB — CBC
HCT: 27.4 % — ABNORMAL LOW (ref 40.0–52.0)
HEMOGLOBIN: 9 g/dL — AB (ref 13.0–18.0)
MCH: 29.2 pg (ref 26.0–34.0)
MCHC: 32.9 g/dL (ref 32.0–36.0)
MCV: 88.8 fL (ref 80.0–100.0)
PLATELETS: 129 10*3/uL — AB (ref 150–440)
RBC: 3.09 MIL/uL — AB (ref 4.40–5.90)
RDW: 17.6 % — ABNORMAL HIGH (ref 11.5–14.5)
WBC: 10.6 10*3/uL (ref 3.8–10.6)

## 2016-03-03 LAB — MRSA PCR SCREENING: MRSA BY PCR: NEGATIVE

## 2016-03-03 LAB — LACTIC ACID, PLASMA: Lactic Acid, Venous: 0.8 mmol/L (ref 0.5–1.9)

## 2016-03-03 LAB — TRIGLYCERIDES: Triglycerides: 81 mg/dL (ref <150)

## 2016-03-03 LAB — CK: Total CK: 336 U/L (ref 49–397)

## 2016-03-03 LAB — GLUCOSE, CAPILLARY
Glucose-Capillary: 119 mg/dL — ABNORMAL HIGH (ref 65–99)
Glucose-Capillary: 137 mg/dL — ABNORMAL HIGH (ref 65–99)

## 2016-03-03 LAB — AMMONIA: Ammonia: 22 umol/L (ref 9–35)

## 2016-03-03 SURGERY — CREATION, TRACHEOSTOMY
Anesthesia: General | Wound class: Clean Contaminated

## 2016-03-03 MED ORDER — ETOMIDATE 2 MG/ML IV SOLN
20.0000 mg | Freq: Once | INTRAVENOUS | Status: AC
  Administered 2016-03-03: 20 mg via INTRAVENOUS

## 2016-03-03 MED ORDER — SODIUM CHLORIDE 0.9 % IV SOLN
INTRAVENOUS | Status: DC | PRN
  Administered 2016-03-03: 13:00:00 via INTRAVENOUS

## 2016-03-03 MED ORDER — ACETAMINOPHEN 325 MG PO TABS: 650.0000 mg | ORAL_TABLET | ORAL | Status: DC | PRN

## 2016-03-03 MED ORDER — VANCOMYCIN HCL IN DEXTROSE 1-5 GM/200ML-% IV SOLN: 1000.0000 mg | Freq: Once | INTRAVENOUS | Status: DC

## 2016-03-03 MED ORDER — FENTANYL CITRATE (PF) 100 MCG/2ML IJ SOLN
50.0000 ug | INTRAMUSCULAR | Status: AC | PRN
  Administered 2016-03-04 (×3): 50 ug via INTRAVENOUS
  Filled 2016-03-03 (×3): qty 2

## 2016-03-03 MED ORDER — FAMOTIDINE IN NACL 20-0.9 MG/50ML-% IV SOLN
20.0000 mg | Freq: Two times a day (BID) | INTRAVENOUS | Status: DC
  Administered 2016-03-03 – 2016-03-09 (×12): 20 mg via INTRAVENOUS
  Filled 2016-03-03 (×12): qty 50

## 2016-03-03 MED ORDER — IPRATROPIUM-ALBUTEROL 0.5-2.5 (3) MG/3ML IN SOLN
9.0000 mL | Freq: Once | RESPIRATORY_TRACT | Status: AC
  Administered 2016-03-03: 9 mL via RESPIRATORY_TRACT
  Filled 2016-03-03: qty 9

## 2016-03-03 MED ORDER — ROCURONIUM BROMIDE 100 MG/10ML IV SOLN
INTRAVENOUS | Status: DC | PRN
  Administered 2016-03-03: 50 mg via INTRAVENOUS

## 2016-03-03 MED ORDER — SODIUM CHLORIDE 0.9 % IV SOLN
INTRAVENOUS | Status: DC
  Administered 2016-03-03 – 2016-03-04 (×2): via INTRAVENOUS

## 2016-03-03 MED ORDER — ORAL CARE MOUTH RINSE
15.0000 mL | Freq: Four times a day (QID) | OROMUCOSAL | Status: DC
  Administered 2016-03-04 – 2016-03-06 (×10): 15 mL via OROMUCOSAL

## 2016-03-03 MED ORDER — SODIUM CHLORIDE 0.9% FLUSH
3.0000 mL | Freq: Two times a day (BID) | INTRAVENOUS | Status: DC
  Administered 2016-03-03 – 2016-03-07 (×6): 3 mL via INTRAVENOUS

## 2016-03-03 MED ORDER — FAMOTIDINE IN NACL 20-0.9 MG/50ML-% IV SOLN: 20.0000 mg | Freq: Two times a day (BID) | INTRAVENOUS | Status: DC

## 2016-03-03 MED ORDER — CALCIUM GLUCONATE 10 % IV SOLN
INTRAVENOUS | Status: AC
  Filled 2016-03-03: qty 10

## 2016-03-03 MED ORDER — VANCOMYCIN HCL IN DEXTROSE 1-5 GM/200ML-% IV SOLN
1000.0000 mg | Freq: Once | INTRAVENOUS | Status: AC
  Administered 2016-03-03: 1000 mg via INTRAVENOUS
  Filled 2016-03-03: qty 200

## 2016-03-03 MED ORDER — LIDOCAINE-EPINEPHRINE 1 %-1:100000 IJ SOLN
INTRAMUSCULAR | Status: AC
  Filled 2016-03-03: qty 1

## 2016-03-03 MED ORDER — ONDANSETRON HCL 4 MG/2ML IJ SOLN: 4.0000 mg | Freq: Four times a day (QID) | INTRAMUSCULAR | Status: DC | PRN

## 2016-03-03 MED ORDER — PROPOFOL 1000 MG/100ML IV EMUL
0.0000 ug/kg/min | INTRAVENOUS | Status: DC
Start: 2016-03-03 — End: 2016-03-04
  Administered 2016-03-03: 30 ug/kg/min via INTRAVENOUS
  Administered 2016-03-04: 20 ug/kg/min via INTRAVENOUS

## 2016-03-03 MED ORDER — VECURONIUM BROMIDE 10 MG IV SOLR
10.0000 mg | Freq: Once | INTRAVENOUS | Status: AC
  Administered 2016-03-03: 10 mg via INTRAVENOUS
  Filled 2016-03-03: qty 10

## 2016-03-03 MED ORDER — PHENYLEPHRINE HCL 10 MG/ML IJ SOLN
INTRAMUSCULAR | Status: DC | PRN
  Administered 2016-03-03: 100 ug via INTRAVENOUS
  Administered 2016-03-03: 200 ug via INTRAVENOUS
  Administered 2016-03-03: 100 ug via INTRAVENOUS

## 2016-03-03 MED ORDER — DEXTROSE 5 % IV SOLN
2.0000 g | Freq: Three times a day (TID) | INTRAVENOUS | Status: DC
  Administered 2016-03-03 – 2016-03-09 (×18): 2 g via INTRAVENOUS
  Filled 2016-03-03 (×19): qty 2

## 2016-03-03 MED ORDER — PROPOFOL 1000 MG/100ML IV EMUL
INTRAVENOUS | Status: AC
  Administered 2016-03-03: 5 ug/kg/min via INTRAVENOUS
  Filled 2016-03-03: qty 100

## 2016-03-03 MED ORDER — CHLORHEXIDINE GLUCONATE 0.12% ORAL RINSE (MEDLINE KIT)
15.0000 mL | Freq: Two times a day (BID) | OROMUCOSAL | Status: DC
Start: 2016-03-03 — End: 2016-03-09
  Administered 2016-03-03 – 2016-03-09 (×12): 15 mL via OROMUCOSAL

## 2016-03-03 MED ORDER — SODIUM CHLORIDE 0.9 % IV BOLUS (SEPSIS)
1000.0000 mL | INTRAVENOUS | Status: AC
Start: 2016-03-03 — End: 2016-03-03
  Administered 2016-03-03: 1000 mL via INTRAVENOUS

## 2016-03-03 MED ORDER — NOREPINEPHRINE 4 MG/250ML-% IV SOLN
0.0000 ug/min | INTRAVENOUS | Status: DC
  Administered 2016-03-04: 0.3 ug/min via INTRAVENOUS
  Filled 2016-03-03: qty 250

## 2016-03-03 MED ORDER — NOREPINEPHRINE BITARTRATE 1 MG/ML IV SOLN: 2.0000 ug/min | INTRAVENOUS | Status: DC

## 2016-03-03 MED ORDER — LIDOCAINE-EPINEPHRINE 1 %-1:100000 IJ SOLN
INTRAMUSCULAR | Status: DC | PRN
  Administered 2016-03-03: 5 mL

## 2016-03-03 MED ORDER — DEXTROSE 5 % IV SOLN
2.0000 g | Freq: Once | INTRAVENOUS | Status: AC
  Administered 2016-03-03: 2 g via INTRAVENOUS
  Filled 2016-03-03: qty 2

## 2016-03-03 MED ORDER — PROPOFOL 1000 MG/100ML IV EMUL
5.0000 ug/kg/min | INTRAVENOUS | Status: DC
Start: 2016-03-03 — End: 2016-03-04
  Administered 2016-03-03: 5 ug/kg/min via INTRAVENOUS
  Filled 2016-03-03 (×2): qty 100

## 2016-03-03 MED ORDER — SODIUM CHLORIDE 0.9 % IV SOLN
1.0000 g | Freq: Once | INTRAVENOUS | Status: AC
  Administered 2016-03-03: 1 g via INTRAVENOUS

## 2016-03-03 MED ORDER — IOPAMIDOL (ISOVUE-300) INJECTION 61%
100.0000 mL | Freq: Once | INTRAVENOUS | Status: AC | PRN
  Administered 2016-03-03: 100 mL via INTRAVENOUS

## 2016-03-03 MED ORDER — FENTANYL CITRATE (PF) 100 MCG/2ML IJ SOLN
50.0000 ug | INTRAMUSCULAR | Status: DC | PRN
  Administered 2016-03-04 – 2016-03-08 (×7): 50 ug via INTRAVENOUS
  Filled 2016-03-03 (×7): qty 2

## 2016-03-03 MED ORDER — SODIUM CHLORIDE 0.9 % IV SOLN
250.0000 mL | INTRAVENOUS | Status: DC | PRN
  Administered 2016-03-07: 250 mL via INTRAVENOUS

## 2016-03-03 MED ORDER — ENOXAPARIN SODIUM 40 MG/0.4ML ~~LOC~~ SOLN
40.0000 mg | SUBCUTANEOUS | Status: DC
  Administered 2016-03-03 – 2016-03-08 (×6): 40 mg via SUBCUTANEOUS
  Filled 2016-03-03 (×6): qty 0.4

## 2016-03-03 MED ORDER — SODIUM CHLORIDE 0.9 % IV SOLN: 250.0000 mL | INTRAVENOUS | Status: DC | PRN

## 2016-03-03 MED ORDER — DEXTROSE 5 % IV SOLN: 2.0000 g | Freq: Three times a day (TID) | INTRAVENOUS | Status: DC

## 2016-03-03 MED ORDER — SODIUM CHLORIDE 0.9% FLUSH
3.0000 mL | INTRAVENOUS | Status: DC | PRN
  Administered 2016-03-06: 3 mL via INTRAVENOUS
  Filled 2016-03-03: qty 3

## 2016-03-03 MED ORDER — ROCURONIUM BROMIDE 50 MG/5ML IV SOLN
100.0000 mg | Freq: Once | INTRAVENOUS | Status: AC
  Administered 2016-03-03: 100 mg via INTRAVENOUS
  Filled 2016-03-03: qty 10

## 2016-03-03 MED ORDER — LEVOFLOXACIN IN D5W 750 MG/150ML IV SOLN
750.0000 mg | Freq: Once | INTRAVENOUS | Status: AC
  Administered 2016-03-03: 750 mg via INTRAVENOUS
  Filled 2016-03-03: qty 150

## 2016-03-03 MED ORDER — VANCOMYCIN HCL 10 G IV SOLR
1250.0000 mg | Freq: Two times a day (BID) | INTRAVENOUS | Status: DC
  Administered 2016-03-04: 1250 mg via INTRAVENOUS
  Filled 2016-03-03 (×2): qty 1250

## 2016-03-03 SURGICAL SUPPLY — 25 items
BLADE SURG 15 STRL LF DISP TIS (BLADE) ×1 IMPLANT
BLADE SURG 15 STRL SS (BLADE) ×2
BLADE SURG SZ11 CARB STEEL (BLADE) ×3 IMPLANT
CANISTER SUCT 1200ML W/VALVE (MISCELLANEOUS) ×3 IMPLANT
ELECT REM PT RETURN 9FT ADLT (ELECTROSURGICAL) ×3
ELECTRODE REM PT RTRN 9FT ADLT (ELECTROSURGICAL) ×1 IMPLANT
GAUZE IODOFORM PACK 1/2 7832 (GAUZE/BANDAGES/DRESSINGS) IMPLANT
GLOVE BIO SURGEON STRL SZ7.5 (GLOVE) ×3 IMPLANT
GOWN STRL REUS W/ TWL LRG LVL3 (GOWN DISPOSABLE) ×2 IMPLANT
GOWN STRL REUS W/TWL LRG LVL3 (GOWN DISPOSABLE) ×4
HARMONIC SCALPEL FOCUS (MISCELLANEOUS) ×3 IMPLANT
HEMOSTAT SURGICEL 2X3 (HEMOSTASIS) IMPLANT
KIT RM TURNOVER STRD PROC AR (KITS) ×3 IMPLANT
LABEL OR SOLS (LABEL) ×3 IMPLANT
NS IRRIG 500ML POUR BTL (IV SOLUTION) ×3 IMPLANT
PACK HEAD/NECK (MISCELLANEOUS) ×3 IMPLANT
SPONGE EXCIL AMD DRAIN 4X4 6P (MISCELLANEOUS) ×3 IMPLANT
SPONGE KITTNER 5P (MISCELLANEOUS) ×3 IMPLANT
SUCTION FRAZIER HANDLE 10FR (MISCELLANEOUS)
SUCTION TUBE FRAZIER 10FR DISP (MISCELLANEOUS) IMPLANT
SUT SILK 2 0 (SUTURE) ×4
SUT SILK 2 0 SH (SUTURE) ×12 IMPLANT
SUT SILK 2-0 30XBRD TIE 12 (SUTURE) ×2 IMPLANT
SUT VIC AB 3-0 PS2 18 (SUTURE) IMPLANT
TUBE TRACH SHILEY 8 DIST CUF (TUBING) ×3 IMPLANT

## 2016-03-03 NOTE — H&P (Signed)
PULMONARY / CRITICAL CARE MEDICINE   Name: DAYVIN ABER MRN: [id number] DOB: [birth year]    ADMISSION DATE:  03/03/2016 CHIEF COMPLAINT:  Acute resp failure   STUDIES:  CT chest -RT sided multifocal opacities c/w pneumonia  SIGNIFICANT EVENTS: 9/13-failed intubation, emergent trach   HISTORY OF PRESENT ILLNESS:  70 yo white male seen for acute resp failure Patient was found unresponsive at home after sister found patient, EMS arrived and noted to have posturing and o2sats in 60's Patient was bought to ER, GCS<8, not protecting airway, increased WOB patient had multiple attempts at intubation and subsequently went to OR for emergent trach by ENT  Patient currently unresponsive, gcs<8T on vent s/p trach  - It is unknown how long he was down but he was discharged from the hospital 3 days ago after an acute respiratory failure episode with hypoxia and hypoxemia to be secondary to his COPD.    -He has extensive sequela of traumatic injury including what appears to be dependent lividity on his extremities, bruising on his lower extremities, dependent edema in his scrotum, swelling around his eyes and chemosis more notable on the left than the right, all consistent with being down for an extended period of time.  No other history is currently available.   PAST MEDICAL HISTORY :   has a past medical history of Antalgic gait; Carotid artery occlusion; Chronic neck and back pain; Complication of anesthesia; COPD (chronic obstructive pulmonary disease) (HCC); Depression; DJD (degenerative joint disease); Hypertension; Lumbago; and Peripheral neuropathy (HCC).  has a past surgical history that includes Tonsillectomy; Carpal tunnel release; Abdominal aortic aneurysm repair (2009); Pilonidal cyst / sinus excision; Spine surgery (09/15/11); Spine surgery (09/15/11); Neck surgery; Rotary cuff; Hammer toe surgery (12/10/2011); Metatarsal osteotomy (12/10/2011); Abdominal aortic aneurysm repair; and Cardiac  catheterization (N/A, 02/11/2015). Prior to Admission medications   Medication Sig Start Date End Date Taking? Authorizing Provider  ALPRAZolam (XANAX) 0.5 MG tablet Take 0.25-0.5 mg by mouth every 8 (eight) hours as needed for anxiety.     Historical Provider, MD  aspirin EC 81 MG tablet Take 81 mg by mouth daily.    Historical Provider, MD  baclofen (LIORESAL) 20 MG tablet Take 20 mg by mouth 3 (three) times daily as needed. For muscle spasms. 02/25/15   Historical Provider, MD  budesonide-formoterol (SYMBICORT) 160-4.5 MCG/ACT inhaler Inhale 2 puffs into the lungs 2 (two) times daily.    Historical Provider, MD  diazepam (VALIUM) 2 MG tablet Take 1 tablet (2 mg total) by mouth every 8 (eight) hours as needed for muscle spasms. 02/18/16 02/17/17  Jami L Hagler, PA-C  etodolac (LODINE) 500 MG tablet Take 500 mg by mouth 2 (two) times daily.    Historical Provider, MD  furosemide (LASIX) 40 MG tablet Take 1 tablet (40 mg total) by mouth 2 (two) times daily. 08/12/14   Lonia Blood, MD  Ipratropium-Albuterol (COMBIVENT RESPIMAT) 20-100 MCG/ACT AERS respimat Inhale 1 puff into the lungs every 6 (six) hours. 08/12/14   Lonia Blood, MD  ipratropium-albuterol (DUONEB) 0.5-2.5 (3) MG/3ML SOLN Take 3 mLs by nebulization every 6 (six) hours. Patient taking differently: Take 3 mLs by nebulization every 6 (six) hours as needed (for wheezing/shortness of breath).  08/12/14   Lonia Blood, MD  ketoconazole (NIZORAL) 2 % shampoo Apply 1 application topically 2 (two) times a week. Leave on 5 minutes then rinse well. 10/06/15   Historical Provider, MD  levothyroxine (SYNTHROID, LEVOTHROID) 125 MCG tablet Take 1 tablet (125  mcg total) by mouth daily before breakfast. 08/12/14   Lonia Blood, MD  lisinopril (PRINIVIL,ZESTRIL) 10 MG tablet Take 10 mg by mouth daily.    Historical Provider, MD  mirtazapine (REMERON) 15 MG tablet Take 15 mg by mouth at bedtime.    Historical Provider, MD  Multiple Vitamin  (MULTIVITAMIN WITH MINERALS) TABS tablet Take 1 tablet by mouth daily.    Historical Provider, MD  omeprazole (PRILOSEC) 20 MG capsule Take 20 mg by mouth daily.    Historical Provider, MD  oxyCODONE (ROXICODONE) 5 MG immediate release tablet Take 1 tablet (5 mg total) by mouth every 8 (eight) hours as needed. 04/23/15 04/22/16  Chinita Pester, FNP  sertraline (ZOLOFT) 100 MG tablet Take 100 mg by mouth daily.    Historical Provider, MD   Allergies  Allergen Reactions  . Ampicillin Swelling and Other (See Comments)    Unable to obtain enough information to answer additional questions about this medication.  Has patient had a PCN reaction causing immediate rash, facial/tongue/throat swelling, SOB or lightheadedness with hypotension: Yes Has patient had a PCN reaction causing severe rash involving mucus membranes or skin necrosis: No Has patient had a PCN reaction that required hospitalization No Has patient had a PCN reaction occurring within the last 10 years: Yes If all of the above answers are "NO", then may pr  . Nortriptyline Other (See Comments)    Sleep walking  . Brimonidine Other (See Comments)    Reaction:  Unknown   . Cyclobenzaprine Other (See Comments)    Reaction: Hallucinations  . Tizanidine Other (See Comments)    Reaction: Hallucinations  . Gabapentin Rash    FAMILY HISTORY:  indicated that his mother is deceased. He indicated that his father is deceased. He indicated that his brother is alive.   SOCIAL HISTORY:  reports that he quit smoking about 9 years ago. He has never used smokeless tobacco. He reports that he drinks about 1.2 oz of alcohol per week . He reports that he does not use drugs.  REVIEW OF SYSTEMS: unobtainable due to critical illness    VITAL SIGNS: Temp:  [91.9 F (33.3 C)-92.6 F (33.7 C)] 91.9 F (33.3 C) (09/13 1445) Pulse Rate:  [57-113] 57 (09/13 1445) Resp:  [12-30] 16 (09/13 1445) BP: (88-176)/(62-93) 129/74 (09/13 1445) SpO2:  [60  %-100 %] 93 % (09/13 1515) FiO2 (%):  [100 %] 100 % (09/13 1420) Weight:  [230 lb (104.3 kg)] 230 lb (104.3 kg) (09/13 1134) HEMODYNAMICS:   VENTILATOR SETTINGS: Vent Mode: AC FiO2 (%):  [100 %] 100 % Set Rate:  [14 bmp-16 bmp] 16 bmp Vt Set:  [500 mL] 500 mL PEEP:  [10 cmH20] 10 cmH20 INTAKE / OUTPUT:  Intake/Output Summary (Last 24 hours) at 03/03/16 1816 Last data filed at 03/03/16 1355  Gross per 24 hour  Intake                0 ml  Output              105 ml  Net             -105 ml    PHYSICAL EXAMINATION: PHYSICAL EXAMINATION: Physical Examination:   GENERAL:critically ill appearing, +resp distress HEAD: Normocephalic, atraumatic.  EYES: Pupils equal, round, reactive to light.  No scleral icterus.  MOUTH: Moist mucosal membrane. NECK: Supple. No thyromegaly. No nodules. No JVD. c collar in place s/p trach PULMONARY: Diffuse coarse rhonchi right sided +wheezes +rhonchi CARDIOVASCULAR: S1  and S2. Regular rate and rhythm. No murmurs, rubs, or gallops.  GASTROINTESTINAL: Soft, nontender, +distended. No masses. Positive bowel sounds. No hepatosplenomegaly.  MUSCULOSKELETAL: No swelling, clubbing, or edema.  NEUROLOGIC: GCS<8T SKIN:intact,warm,dry eccymosis, scrotal edema    LABS:  CBC  Recent Labs Lab 02/27/16 1617 02/28/16 0414 03/03/16 1140  WBC 7.6 7.0 10.6  HGB 10.5* 9.8* 9.0*  HCT 31.6* 29.0* 27.4*  PLT 141* 137* 129*   Coag's No results for input(s): APTT, INR in the last 168 hours. BMET  Recent Labs Lab 02/27/16 1617 02/28/16 0414 03/03/16 1140  NA 134* 135 136  K 4.3 4.0 3.3*  CL 93* 95* 106  CO2 34* 34* 26  BUN 20 17 42*  CREATININE 1.06 0.88 1.03  GLUCOSE 99 90 101*   Electrolytes  Recent Labs Lab 02/27/16 1617 02/28/16 0414 03/03/16 1140  CALCIUM 8.8* 8.6* 6.3*   Sepsis Markers  Recent Labs Lab 03/03/16 1140  LATICACIDVEN 0.8   ABG  Recent Labs Lab 02/27/16 1617 02/28/16 0000 03/03/16 1443  PHART 7.33* 7.41  7.29*  PCO2ART 72* 59* 63*  PO2ART 107 76* 51*   Liver Enzymes  Recent Labs Lab 03/03/16 1140  AST 25  ALT 14*  ALKPHOS 42  BILITOT 0.9  ALBUMIN 2.7*   Cardiac Enzymes  Recent Labs Lab 02/27/16 1617  TROPONINI <0.03   Glucose  Recent Labs Lab 02/27/16 2247 03/03/16 1148  GLUCAP 105* 119*    Imaging Dg Forearm Left  Result Date: 03/03/2016 CLINICAL DATA:  Found unresponsive at home with bruising, probable fall EXAM: LEFT FOREARM - 2 VIEW COMPARISON:  None. FINDINGS: The left radius and ulna are intact and normally aligned. No acute fracture is seen. The left elbow joint is unremarkable on the images obtained. IMPRESSION: No fracture. Electronically Signed   By: Dwyane DeePaul  Barry M.D.   On: 03/03/2016 16:26   Dg Forearm Right  Result Date: 03/03/2016 CLINICAL DATA:  Unresponsive, multiple bruises EXAM: RIGHT FOREARM - 2 VIEW COMPARISON:  None. FINDINGS: No fracture or dislocation is seen. The joint spaces are preserved. Visualized soft tissues are within normal limits. IMPRESSION: No fracture or dislocation is seen. Electronically Signed   By: Charline BillsSriyesh  Krishnan M.D.   On: 03/03/2016 16:25   Dg Tibia/fibula Left  Result Date: 03/03/2016 CLINICAL DATA:  Found unresponsive on floor at home, probable fall EXAM: LEFT TIBIA AND FIBULA - 2 VIEW COMPARISON:  None. FINDINGS: Views of the left tibia and fibula were obtained. There does appear to be an abnormal bony density adjacent to the distal left fibula which may represent fracture. Views of the left ankle may be helpful to evaluate further. The ankle joint appears normal. IMPRESSION: Suspect fracture of the distal left fibula. Consider left ankle views. Electronically Signed   By: Dwyane DeePaul  Barry M.D.   On: 03/03/2016 16:34   Dg Tibia/fibula Right  Result Date: 03/03/2016 CLINICAL DATA:  Found unresponsive on the floor at home, probable fall EXAM: RIGHT TIBIA AND FIBULA - 2 VIEW COMPARISON:  None. FINDINGS: Views of the left tibia and  fibula show no abnormality. No fracture is seen. Alignment is normal. IMPRESSION: Negative. Electronically Signed   By: Dwyane DeePaul  Barry M.D.   On: 03/03/2016 16:35   Ct Head Wo Contrast  Result Date: 03/03/2016 CLINICAL DATA:  Found unresponsive at home, unknown duration of time down, fall, hypertension, COPD, former smoker, dilated cardiomyopathy EXAM: CT HEAD WITHOUT CONTRAST CT CERVICAL SPINE WITHOUT CONTRAST TECHNIQUE: Multidetector CT imaging of the head and  cervical spine was performed following the standard protocol without intravenous contrast. Multiplanar CT image reconstructions of the cervical spine were also generated. COMPARISON:  CT head and cervical spine 02/27/2016 FINDINGS: CT HEAD FINDINGS Minimal atrophy. Normal ventricular morphology. No midline shift or mass effect. Small vessel chronic ischemic changes of deep cerebral white matter. Scattered parenchymal calcifications. No intracranial hemorrhage, mass lesion, or evidence acute infarction. No extra-axial fluid collections. Atherosclerotic calcification of LEFT vertebral and BILATERAL internal carotid arteries at skullbase. Scattered mucosal thickening in the paranasal sinuses with tiny amounts of fluid dependently in the maxillary sinuses. No calvarial fractures. CT CERVICAL SPINE FINDINGS Atherosclerotic calcification of internal carotid and vertebral arteries. Large area of opacity at the RIGHT apex consistent with pneumonia or aspiration since no opacity/mass was identified on prior/recent CT. Endotracheal tube at clavicular heads, above carina. Beam hardening artifacts of dental origin and related to orthopedic hardware at the upper cervical spine. Diffuse osseous demineralization. Prior posterior occiputo-cervical fusion extending from occiput to T-2. Hardware intact. Degenerative disc disease with disc space narrowing and endplate spur formation at C3-C4 and C4-C5. Vertebral body heights maintained without fracture or subluxation.  Odontoid appears intact. Prior decompression of the spinal canal with resection of the posterior elements at C1 through C6. IMPRESSION: Mild atrophy with small vessel chronic ischemic changes of deep cerebral white matter. No acute intracranial abnormalities. Prior occipitocervical fusion extending from occipital bone to T-2. Prior posterior spinal decompression of C1 through C6. No acute cervical spine abnormalities. Large area of new opacification at the RIGHT apex question pneumonia versus aspiration. Electronically Signed   By: Ulyses Southward M.D.   On: 03/03/2016 16:50   Ct Chest W Contrast  Result Date: 03/03/2016 CLINICAL DATA:  Found unresponsive at home. Time down unknown. No one with pt to provide EXAM: CT CHEST, ABDOMEN, AND PELVIS WITH CONTRAST TECHNIQUE: Multidetector CT imaging of the chest, abdomen and pelvis was performed following the standard protocol during bolus administration of intravenous contrast. CONTRAST:  ISOVUE-300 IOPAMIDOL (ISOVUE-300) INJECTION 61% COMPARISON:  CT 04/16/2015 FINDINGS: CT CHEST FINDINGS Cardiovascular: Coronary artery calcification and aortic atherosclerotic calcification. Mediastinum/Nodes: No axillary or supraclavicular lymphadenopathy. Small a gas in the soft tissues of the thoracic inlet adjacent to strap muscles (image 5, series 2 and related to recent tracheostomy. Tracheostomy tube in good position. No mediastinal lymphadenopathy. No pericardial fluid. Esophagus normal. Lungs/Pleura: Dense consolidation within the RIGHT lower lobe and patchy consolidation RIGHT middle lobe and RIGHT upper lobe. Small effusion extends along the oblique fissure in the RIGHT hemi thorax. The RIGHT hemidiaphragm is elevated with a loop of colon beneath the RIGHT hemidiaphragm. The LEFT lung is relatively clear. The RIGHT lower lobe bronchus is difficult to follow. The bronchus intermedius is patent. The RIGHT middle lobe and RIGHT upper lobe bronchi are patent. Trachea normal.  There is a tracheostomy tube in place. Musculoskeletal: No acute finding CT ABDOMEN PELVIS FINDINGS Hepatobiliary: No focal hepatic lesion. Large gallstone within lumen gallbladder without gallbladder distension. There is small amount of pericholecystic fluid. Pancreas: Normal pancreas Spleen: Normal spleen Adrenals/Urinary Tract: Adrenal glands and kidneys are normal. Ureters normal. Foley catheter in the bladder. Stomach/Bowel: Stomach, small bowel, appendix, cecum normal. The colon and rectosigmoid colon are normal. Vascular/Lymphatic: Abdominal aortic stent graft in place and well opacified. Reproductive: Prostate small Other: No adenopathy in the abdomen pelvis. Musculoskeletal: No aggressive osseous lesion. Posterior lumbar fusion noted IMPRESSION: 1. Dense consolidation within the RIGHT lower lobe and patchy consolidation in the RIGHT upper lobe.  Differential includes multi focal lobar pneumonia versus mucous plugging versus a combination of both. Cannot exclude bronchogenic carcinoma but less favored. Patient may benefit from bronchoscopy. 2. RIGHT hemidiaphragm is significantly elevated. 3. Small amount fluid around the gallbladder with gallstones. Findings could represent acute or chronic cholecystitis. 4. Abdominal aortic stent graft in place with good opacification. Electronically Signed   By: Genevive Bi M.D.   On: 03/03/2016 16:51   Ct Cervical Spine Wo Contrast  Result Date: 03/03/2016 CLINICAL DATA:  Found unresponsive at home, unknown duration of time down, fall, hypertension, COPD, former smoker, dilated cardiomyopathy EXAM: CT HEAD WITHOUT CONTRAST CT CERVICAL SPINE WITHOUT CONTRAST TECHNIQUE: Multidetector CT imaging of the head and cervical spine was performed following the standard protocol without intravenous contrast. Multiplanar CT image reconstructions of the cervical spine were also generated. COMPARISON:  CT head and cervical spine 02/27/2016 FINDINGS: CT HEAD FINDINGS Minimal  atrophy. Normal ventricular morphology. No midline shift or mass effect. Small vessel chronic ischemic changes of deep cerebral white matter. Scattered parenchymal calcifications. No intracranial hemorrhage, mass lesion, or evidence acute infarction. No extra-axial fluid collections. Atherosclerotic calcification of LEFT vertebral and BILATERAL internal carotid arteries at skullbase. Scattered mucosal thickening in the paranasal sinuses with tiny amounts of fluid dependently in the maxillary sinuses. No calvarial fractures. CT CERVICAL SPINE FINDINGS Atherosclerotic calcification of internal carotid and vertebral arteries. Large area of opacity at the RIGHT apex consistent with pneumonia or aspiration since no opacity/mass was identified on prior/recent CT. Endotracheal tube at clavicular heads, above carina. Beam hardening artifacts of dental origin and related to orthopedic hardware at the upper cervical spine. Diffuse osseous demineralization. Prior posterior occiputo-cervical fusion extending from occiput to T-2. Hardware intact. Degenerative disc disease with disc space narrowing and endplate spur formation at C3-C4 and C4-C5. Vertebral body heights maintained without fracture or subluxation. Odontoid appears intact. Prior decompression of the spinal canal with resection of the posterior elements at C1 through C6. IMPRESSION: Mild atrophy with small vessel chronic ischemic changes of deep cerebral white matter. No acute intracranial abnormalities. Prior occipitocervical fusion extending from occipital bone to T-2. Prior posterior spinal decompression of C1 through C6. No acute cervical spine abnormalities. Large area of new opacification at the RIGHT apex question pneumonia versus aspiration. Electronically Signed   By: Ulyses Southward M.D.   On: 03/03/2016 16:50   Ct Abdomen Pelvis W Contrast  Result Date: 03/03/2016 CLINICAL DATA:  Found unresponsive at home. Time down unknown. No one with pt to provide EXAM:  CT CHEST, ABDOMEN, AND PELVIS WITH CONTRAST TECHNIQUE: Multidetector CT imaging of the chest, abdomen and pelvis was performed following the standard protocol during bolus administration of intravenous contrast. CONTRAST:  ISOVUE-300 IOPAMIDOL (ISOVUE-300) INJECTION 61% COMPARISON:  CT 04/16/2015 FINDINGS: CT CHEST FINDINGS Cardiovascular: Coronary artery calcification and aortic atherosclerotic calcification. Mediastinum/Nodes: No axillary or supraclavicular lymphadenopathy. Small a gas in the soft tissues of the thoracic inlet adjacent to strap muscles (image 5, series 2 and related to recent tracheostomy. Tracheostomy tube in good position. No mediastinal lymphadenopathy. No pericardial fluid. Esophagus normal. Lungs/Pleura: Dense consolidation within the RIGHT lower lobe and patchy consolidation RIGHT middle lobe and RIGHT upper lobe. Small effusion extends along the oblique fissure in the RIGHT hemi thorax. The RIGHT hemidiaphragm is elevated with a loop of colon beneath the RIGHT hemidiaphragm. The LEFT lung is relatively clear. The RIGHT lower lobe bronchus is difficult to follow. The bronchus intermedius is patent. The RIGHT middle lobe and RIGHT upper lobe  bronchi are patent. Trachea normal. There is a tracheostomy tube in place. Musculoskeletal: No acute finding CT ABDOMEN PELVIS FINDINGS Hepatobiliary: No focal hepatic lesion. Large gallstone within lumen gallbladder without gallbladder distension. There is small amount of pericholecystic fluid. Pancreas: Normal pancreas Spleen: Normal spleen Adrenals/Urinary Tract: Adrenal glands and kidneys are normal. Ureters normal. Foley catheter in the bladder. Stomach/Bowel: Stomach, small bowel, appendix, cecum normal. The colon and rectosigmoid colon are normal. Vascular/Lymphatic: Abdominal aortic stent graft in place and well opacified. Reproductive: Prostate small Other: No adenopathy in the abdomen pelvis. Musculoskeletal: No aggressive osseous lesion.  Posterior lumbar fusion noted IMPRESSION: 1. Dense consolidation within the RIGHT lower lobe and patchy consolidation in the RIGHT upper lobe. Differential includes multi focal lobar pneumonia versus mucous plugging versus a combination of both. Cannot exclude bronchogenic carcinoma but less favored. Patient may benefit from bronchoscopy. 2. RIGHT hemidiaphragm is significantly elevated. 3. Small amount fluid around the gallbladder with gallstones. Findings could represent acute or chronic cholecystitis. 4. Abdominal aortic stent graft in place with good opacification. Electronically Signed   By: Genevive Bi M.D.   On: 03/03/2016 16:51   Dg Humerus Left  Result Date: 03/03/2016 CLINICAL DATA:  Found unresponsive at home on floor. Fall. Trauma. Deformity. Initial encounter. EXAM: LEFT HUMERUS - 2+ VIEW COMPARISON:  None. FINDINGS: Degenerative glenohumeral joint space narrowing and marginal spurring are noted. The humerus appears intact without evidence of fracture. No focal osseous lesion or soft tissue abnormality is seen. IMPRESSION: No evidence of acute osseous abnormality. Electronically Signed   By: Sebastian Ache M.D.   On: 03/03/2016 16:27   Dg Humerus Right  Result Date: 03/03/2016 CLINICAL DATA:  Unresponsive, multiple bruises EXAM: RIGHT HUMERUS - 2+ VIEW COMPARISON:  Right shoulder radiographs dated 04/07/2014 FINDINGS: No fracture or dislocation is seen. The joint spaces are preserved. Visualized soft tissues are within normal limits. IMPRESSION: No fracture or dislocation is seen. Electronically Signed   By: Charline Bills M.D.   On: 03/03/2016 16:24   Dg Femur Min 2 Views Left  Result Date: 03/03/2016 CLINICAL DATA:  Found unresponsive on floor at home, probable fall EXAM: LEFT FEMUR 2 VIEWS COMPARISON:  None. FINDINGS: Views of the left humerus show no acute fracture. Alignment is normal. There is calcification of the superficial femoral artery present. IMPRESSION: No acute fracture.  Electronically Signed   By: Dwyane Dee M.D.   On: 03/03/2016 16:33   Dg Femur, Min 2 Views Right  Result Date: 03/03/2016 CLINICAL DATA:  Found unresponsive at home, probable fall EXAM: RIGHT FEMUR 2 VIEWS COMPARISON:  None. FINDINGS: Views of the right femur show no fracture. Alignment is normal. No significant degenerative change is seen. IMPRESSION: Negative. Electronically Signed   By: Dwyane Dee M.D.   On: 03/03/2016 16:32     ASSESSMENT / PLAN:  70 yo white male with h/o COPD admitted to ICU for acute resp failure from hypercapnic resp failure from COPD exacerbation CT chest suggests multilobar pneumonia or RT sided  pulmonary contusion  Exam also shows signs of probable anoxic brain injury   PULMONARY -Respiratory Failure -continue Full MV support -continue Bronchodilator Therapy -Wean Fio2 and PEEP as tolerated   CARDIOVASCULAR BP stable, ICU monitoring needed  RENAL Follow chem 7  GASTROINTESTINAL NPO for now  HEMATOLOGIC Follow CBC  INFECTIOUS Pneumonia, will treat  Empiric abx Obtain cultures   ENDOCRINE - ICU hypoglycemic\Hyperglycemia protocol   NEUROLOGIC - intubated  - RASS goal: -1 -avoid sedatives and assess  neuro status Will require Complete Neuro work up if patient neuro status does not improve MRI,  EEG    I have personally obtained a history, examined the patient, evaluated Pertinent laboratory and RadioGraphic/imaging results, and  formulated the assessment and plan   The Patient requires high complexity decision making for assessment and support, frequent evaluation and titration of therapies, application of advanced monitoring technologies and extensive interpretation of multiple databases. Critical Care Time devoted to patient care services described in this note is 55 minutes.   Overall, patient is critically ill, prognosis is guarded.  Patient with Multiorgan failure and at high risk for cardiac arrest and death.    Lucie Leather, M.D.  [facility]  Medical Director [facility] Medical Director [facility] Cardio-Pulmonary Department

## 2016-03-03 NOTE — Anesthesia Procedure Notes (Signed)
Date/Time: 03/03/2016 1:09 PM Performed by: Ginger CarneMICHELET, Khadir Roam Pre-anesthesia Checklist: Patient identified, Emergency Drugs available, Suction available, Patient being monitored and Timeout performed Patient Re-evaluated:Patient Re-evaluated prior to inductionOxygen Delivery Method: Circle system utilized Preoxygenation: Pre-oxygenation with 100% oxygen LMA Size: 4.5 Tube type: Oral Placement Confirmation: positive ETCO2 Tube secured with: Tape Comments: Pt up from ED   With LMA 4.5 in place O2 in use with monitors

## 2016-03-03 NOTE — ED Notes (Signed)
Pt arrived from the OR at this time back to ED 18 with new trach #8 shiley cuffed.  RT present placing him on ventilator.  Propofol running at 7940mcg/kg/min on arrival to ED room.  BP decreased, see flowsheet. MD aware.

## 2016-03-03 NOTE — Anesthesia Postprocedure Evaluation (Signed)
Anesthesia Post Note  Patient: Chad LabradorDanny L Walsh  Procedure(s) Performed: Procedure(s) (LRB): TRACHEOSTOMY (N/A)  Patient location during evaluation: Other Anesthesia Type: General Level of consciousness: comatose Vital Signs Assessment: post-procedure vital signs reviewed and stable Respiratory status: patient on ventilator - see flowsheet for VS Cardiovascular status: stable    Last Vitals:  Vitals:   03/03/16 1440 03/03/16 1445  BP: 110/77 129/74  Pulse: (!) 58 (!) 57  Resp: 16 16  Temp: (!) 33.3 C (!) 33.3 C    Last Pain:  Vitals:   03/03/16 1227  TempSrc: (S) Rectal                 KEPHART,WILLIAM K

## 2016-03-03 NOTE — Therapy (Signed)
Called to patient room for intubation/vent set up. Patient found obtunded, Dr.Forbach setting up for intubation. Unable to hyperextend neck due to cervical fusion. Dr.Forbach unable to intubated after several attempts using multiple devices. Anesthesia called, they were also unable to intubate after several attempts, using bronchoscope. Patient became increasingly difficult to mask/bag with each attempt. Patient taken to OR for emergency trach. Back to room at 1420, placed on vent. Taken to x-ray and CT scan subsequently. Back in Room 18 currently

## 2016-03-03 NOTE — ED Triage Notes (Signed)
EMS was called out by the sister to pt house where he lives by himself. Sister states she noticed he was confused last night on the phone, but did not call EMS till this morning.  On EMS arrival, no one was present with pt, and pt was found down in the living room with O2 sat 60% on RA (pt had O2 tank that was running, but not on pt).  Pt with multiple bruises and responds only to firm sternal rub.

## 2016-03-03 NOTE — Transfer of Care (Signed)
Immediate Anesthesia Transfer of Care Note  Patient: [name]  Procedure(s) Performed: Procedure(s): TRACHEOSTOMY (N/A)  Patient Location: ER  Anesthesia Type:General  Level of Consciousness: unresponsive  Airway & Oxygen Therapy: Patient placed on Ventilator (see vital sign flow sheet for setting)  Post-op Assessment: Report given to RN and Post -op Vital signs reviewed and stable  Post vital signs: Reviewed and stable  Last Vitals:  Vitals:   03/03/16 1227 03/03/16 1417  BP:  111/74  Pulse:  64  Resp:  12  Temp: (S) (!) 33.7 C     Last Pain:  Vitals:   03/03/16 1227  TempSrc: (S) Rectal         Complications: No apparent anesthesia complications

## 2016-03-03 NOTE — Progress Notes (Signed)
Pharmacy Antibiotic Note  Chad LabradorDanny L Huffman is a 70 y.o. male admitted on 03/03/2016 with pneumonia and respiratory failure.  Pharmacy has been consulted for aztreonam and vancomcyin dosing.  Plan: Vanc DW: 83 kg    Ke: 0.071  T1/2: 9.8   Vd: 58  Will start patient on vancomycin 1gm IV x1, followed by a regiment of vancomycin 1250mg  IV every 12 hours with 6 hour stack dosing. Calculated trough at Css 17.8. Plan for trough prior to 5th dose.   Will start the patient on Aztreonam 2gm IV every 8 hours.   Pharmacy will continue to follow and adjust as needed.   Height: 5\' 8"  (172.7 cm) Weight: 230 lb (104.3 kg) IBW/kg (Calculated) : 68.4  Temp (24hrs), Avg:92.1 F (33.4 C), Min:91.9 F (33.3 C), Max:92.6 F (33.7 C)   Recent Labs Lab 02/27/16 1617 02/28/16 0414 03/03/16 1140  WBC 7.6 7.0 10.6  CREATININE 1.06 0.88 1.03  LATICACIDVEN  --   --  0.8    Estimated Creatinine Clearance: 80.4 mL/min (by C-G formula based on SCr of 1.03 mg/dL).    Allergies  Allergen Reactions  . Ampicillin Swelling and Other (See Comments)    Unable to obtain enough information to answer additional questions about this medication.  Has patient had a PCN reaction causing immediate rash, facial/tongue/throat swelling, SOB or lightheadedness with hypotension: Yes Has patient had a PCN reaction causing severe rash involving mucus membranes or skin necrosis: No Has patient had a PCN reaction that required hospitalization No Has patient had a PCN reaction occurring within the last 10 years: Yes If all of the above answers are "NO", then may pr  . Nortriptyline Other (See Comments)    Sleep walking  . Brimonidine Other (See Comments)    Reaction:  Unknown   . Cyclobenzaprine Other (See Comments)    Reaction: Hallucinations  . Tizanidine Other (See Comments)    Reaction: Hallucinations  . Gabapentin Rash    Antimicrobials this admission: 9/13 Aztreonam  >>  9/13 vancomcyin >>   Dose adjustments this  admission:   Microbiology results: 9/13 BCx: pending 9/13 UCx: pending   MRSA PCR:   Thank you for allowing pharmacy to be a part of this patient's care.  Cher NakaiSheema Verity Gilcrest, PharmD Clinical Pharmacist 03/03/2016 8:11 PM

## 2016-03-03 NOTE — ED Provider Notes (Addendum)
[facility] Emergency Department Provider Note  ____________________________________________   First MD Initiated Contact with Patient 03/03/16 1121     (approximate)  I have reviewed the triage vital signs and the nursing notes.   HISTORY  Chief Complaint Altered Mental Status  L5 caveat: The patient is acutely, vertically ill and unable to provide any history or review of systems  HPI Chad Huffman is a 70 y.o. male resents by EMS minimally responsive after being found down at his place of residence.  It is unknown how long he was down but he was discharged from the hospital 3 days ago after an acute respiratory failure episode with hypoxia and hypoxemia to be secondary to his COPD.  When he was found by EMS his oxygen saturation was in the low 60s and he was minimally responsive.  I was called immediately to his room in the emergency department and the patient was only arousable by brisk sternal rub and then immediately became unresponsive again with sonorous respirations.  He has extensive sequela of traumatic injury including what appears to be dependent lividity on his extremities, but appears to be multiple fractures of bilateral forearms, bruising on his lower extremities, dependent edema in his scrotum, swelling around his eyes and chemosis more notable on the left than the right, all consistent with being down for an extended period of time.  No other history is currently available.   Past Medical History:  Diagnosis Date  . Antalgic gait   . Carotid artery occlusion   . Chronic neck and back pain   . Complication of anesthesia    pt has had cervical fusion-limited neck flextion  . COPD (chronic obstructive pulmonary disease) (HCC)   . Depression   . DJD (degenerative joint disease)    Right shoulder  . Hypertension   . Lumbago   . Peripheral neuropathy [facility])     Patient Active Problem List   Diagnosis Date Noted  . Respiratory failure (HCC)  03/03/2016  . Respiratory failure with hypoxia and hypercapnia (HCC) 07/22/2015  . Cervical vertebral fusion   . History of lumbar laminectomy   . Chronic respiratory acidosis   . Depression   . Chronic neck and back pain   . Acute on chronic respiratory failure with hypercapnia (HCC)   . Acute on chronic respiratory failure with hypoxia (HCC)   . COPD exacerbation (HCC)   . HCAP (healthcare-associated pneumonia)   . Congestive dilated cardiomyopathy (HCC)   . Syncope and collapse   . Hyponatremia   . Other specified hypothyroidism   . Acute respiratory failure with hypoxia and hypercarbia (HCC) 08/04/2014  . S/P cervical spinal fusion 08/04/2014  . S/P lumbar laminectomy 08/04/2014  . Right shoulder pain 08/04/2014  . Arthritis of right hip 08/04/2014  . Essential hypertension 08/04/2014  . Hypothyroidism 08/04/2014  . Syncope 08/04/2014  . Fall 08/04/2014  . Low back pain 08/04/2014  . Pedal edema 08/04/2014  . Occlusion and stenosis of carotid artery without mention of cerebral infarction 12/10/2013  . Aftercare following surgery of the circulatory system, NEC 12/10/2013  . Pain of right upper extremity-Right Neck-Shoulder-Back 12/10/2013  . Abdominal aneurysm without mention of rupture 12/03/2011    Past Surgical History:  Procedure Laterality Date  . ABDOMINAL AORTIC ANEURYSM REPAIR  2009   Endovascular AAA repair  . ABDOMINAL AORTIC ANEURYSM REPAIR    . CARPAL TUNNEL RELEASE     bilateral CTS  . HAMMER TOE SURGERY  12/10/2011  Procedure: HAMMER TOE CORRECTION;  Surgeon: Ernestene Kiel, DPM;  Location: [facility];  Service: Podiatry;  Laterality: Right;  Right hammertoe repair second right with 2.5x42 orthopro screw   . METATARSAL OSTEOTOMY  12/10/2011   Procedure: METATARSAL OSTEOTOMY;  Surgeon: Ernestene Kiel, DPM;  Location: [facility];  Service: Podiatry;  Laterality: Right;  Right Keller arthroplasty with size 5 silicone implant right great toe;  second metatarsal osteotomy with 2.0x14 screw  . NECK SURGERY    . PERIPHERAL VASCULAR CATHETERIZATION N/A 02/11/2015   Procedure: Carotid Angiography;  Surgeon: Nada Libman, MD;  Location: [facility] INVASIVE CV LAB;  Service: Cardiovascular;  Laterality: N/A;  . PILONIDAL CYST / SINUS EXCISION    . Rotary cuff    . SPINE SURGERY  09/15/11   Scar tissue removed- back  . SPINE SURGERY  09/15/11   Scar tissue removed- back  . TONSILLECTOMY      Prior to Admission medications   Medication Sig Start Date End Date Taking? Authorizing Provider  ALPRAZolam (XANAX) 0.5 MG tablet Take 0.25-0.5 mg by mouth every 8 (eight) hours as needed for anxiety.     Historical Provider, MD  aspirin EC 81 MG tablet Take 81 mg by mouth daily.    Historical Provider, MD  baclofen (LIORESAL) 20 MG tablet Take 20 mg by mouth 3 (three) times daily as needed. For muscle spasms. 02/25/15   Historical Provider, MD  budesonide-formoterol (SYMBICORT) 160-4.5 MCG/ACT inhaler Inhale 2 puffs into the lungs 2 (two) times daily.    Historical Provider, MD  diazepam (VALIUM) 2 MG tablet Take 1 tablet (2 mg total) by mouth every 8 (eight) hours as needed for muscle spasms. 02/18/16 02/17/17  Jami L Hagler, PA-C  etodolac (LODINE) 500 MG tablet Take 500 mg by mouth 2 (two) times daily.    Historical Provider, MD  furosemide (LASIX) 40 MG tablet Take 1 tablet (40 mg total) by mouth 2 (two) times daily. 08/12/14   Lonia Blood, MD  Ipratropium-Albuterol (COMBIVENT RESPIMAT) 20-100 MCG/ACT AERS respimat Inhale 1 puff into the lungs every 6 (six) hours. 08/12/14   Lonia Blood, MD  ipratropium-albuterol (DUONEB) 0.5-2.5 (3) MG/3ML SOLN Take 3 mLs by nebulization every 6 (six) hours. Patient taking differently: Take 3 mLs by nebulization every 6 (six) hours as needed (for wheezing/shortness of breath).  08/12/14   Lonia Blood, MD  ketoconazole (NIZORAL) 2 % shampoo Apply 1 application topically 2 (two) times a week. Leave on 5  minutes then rinse well. 10/06/15   Historical Provider, MD  levothyroxine (SYNTHROID, LEVOTHROID) 125 MCG tablet Take 1 tablet (125 mcg total) by mouth daily before breakfast. 08/12/14   Lonia Blood, MD  lisinopril (PRINIVIL,ZESTRIL) 10 MG tablet Take 10 mg by mouth daily.    Historical Provider, MD  mirtazapine (REMERON) 15 MG tablet Take 15 mg by mouth at bedtime.    Historical Provider, MD  Multiple Vitamin (MULTIVITAMIN WITH MINERALS) TABS tablet Take 1 tablet by mouth daily.    Historical Provider, MD  omeprazole (PRILOSEC) 20 MG capsule Take 20 mg by mouth daily.    Historical Provider, MD  oxyCODONE (ROXICODONE) 5 MG immediate release tablet Take 1 tablet (5 mg total) by mouth every 8 (eight) hours as needed. 04/23/15 04/22/16  Chinita Pester, FNP  sertraline (ZOLOFT) 100 MG tablet Take 100 mg by mouth daily.    Historical Provider, MD    Allergies Ampicillin; Nortriptyline; Brimonidine; Cyclobenzaprine; Tizanidine; and Gabapentin  Family History  Problem Relation Age of Onset  . Cancer Mother     Stomach  . Hypertension Brother   . Heart attack Father     Social History Social History  Substance Use Topics  . Smoking status: Former Smoker    Quit date: 08/30/2006  . Smokeless tobacco: Never Used  . Alcohol use 1.2 oz/week    2 Cans of beer per week     Comment: occasional use    Review of Systems Level V caveat: Unable to obtain due to critical illness/unresponsiveness ____________________________________________   PHYSICAL EXAM:  VITAL SIGNS: ED Triage Vitals  Enc Vitals Group     BP 03/03/16 1130 (!) 156/86     Pulse Rate 03/03/16 1134 86     Resp 03/03/16 1130 17     Temp --      Temp Source 03/03/16 1134 Oral     SpO2 03/03/16 1128 (!) 60 %     Weight 03/03/16 1134 230 lb (104.3 kg)     Height 03/03/16 1134 5\' 8"  (1.727 m)     Head Circumference --      Peak Flow --      Pain Score --      Pain Loc --      Pain Edu? --      Excl. in GC? --      Constitutional: Sonorous respirations, sequela of subacute injury/trauma, GCS 6 Eyes: The patient has mild bilateral periorbital edema consistent with dependent edema.  He also has chemosis most notable on the left.  Tubals are sluggish but reactive to light Head: Large subacute abrasions/hematoma on his head Nose: No congestion/rhinnorhea and no epistaxis Mouth/Throat: Mucous membranes are moist.  Oropharynx non-erythematous. Neck: No stridor.  No meningeal signs.  Neck is recently slightly flexed due to prior cervical spine fusion from occiput down to T-spine Cardiovascular: Normal rate, regular rhythm. Good peripheral circulation. Grossly normal heart sounds. Respiratory: Sonorous respirations.  Clear breath sounds bilaterally. Gastrointestinal: Obese.  Soft and nontender. No distention.  The scrotum is swollen and ecchymotic as well again consistent with dependent edema. Musculoskeletal & Skin: The patient appears to have bilateral upper extremity fractures in the forearms given obvious deformity, ecchymosis, swelling, and what feels like crepitus most notable on the left arm when moving his forearm.  He says extensive ecchymosis and contusions on bilateral lower extremities. Neurologic:  GCS 6, occasionally seems to posture with bilateral arms   ____________________________________________   LABS (all labs ordered are listed, but only abnormal results are displayed)  Labs Reviewed  CBC - Abnormal; Notable for the following:       Result Value   RBC 3.09 (*)    Hemoglobin 9.0 (*)    HCT 27.4 (*)    RDW 17.6 (*)    Platelets 129 (*)    All other components within normal limits  COMPREHENSIVE METABOLIC PANEL - Abnormal; Notable for the following:    Potassium 3.3 (*)    Glucose, Bld 101 (*)    BUN 42 (*)    Calcium 6.3 (*)    Total Protein 5.2 (*)    Albumin 2.7 (*)    ALT 14 (*)    Anion gap 4 (*)    All other components within normal limits  GLUCOSE, CAPILLARY -  Abnormal; Notable for the following:    Glucose-Capillary 119 (*)    All other components within normal limits  URINALYSIS COMPLETEWITH MICROSCOPIC ([facility] ONLY) - Abnormal; Notable for the  following:    Color, Urine YELLOW (*)    APPearance CLEAR (*)    Hgb urine dipstick 1+ (*)    Protein, ur 30 (*)    Leukocytes, UA 1+ (*)    Bacteria, UA RARE (*)    Squamous Epithelial / LPF 0-5 (*)    All other components within normal limits  BLOOD GAS, ARTERIAL - Abnormal; Notable for the following:    pH, Arterial 7.29 (*)    pCO2 arterial 63 (*)    pO2, Arterial 51 (*)    Bicarbonate 31.7 (*)    Acid-Base Excess 3.0 (*)    All other components within normal limits  URINE CULTURE  CULTURE, BLOOD (ROUTINE X 2)  CULTURE, BLOOD (ROUTINE X 2)  LACTIC ACID, PLASMA  AMMONIA  CK  CBC  BASIC METABOLIC PANEL  BLOOD GAS, ARTERIAL  CBC  CREATININE, SERUM   ____________________________________________  EKG  ED ECG REPORT I, Kalub Morillo, the attending physician, personally viewed and interpreted this ECG.  Date: 03/03/2016 EKG Time: 11:24 Rate: 81 Rhythm: normal sinus rhythm with first-degree AV block QRS Axis: normal Intervals: Right bundle branch block and left posterior fascicular block ST/T Wave abnormalities: normal Conduction Disturbances: none Narrative Interpretation: Evidence of acute ischemia  ____________________________________________  RADIOLOGY I, Maaliyah Adolph, personally viewed and evaluated these images (plain radiographs and CT scans) as part of my medical decision making, as well as reviewing the written report by the radiologist.   Dg Forearm Left  Result Date: 03/03/2016 CLINICAL DATA:  Found unresponsive at home with bruising, probable fall EXAM: LEFT FOREARM - 2 VIEW COMPARISON:  None. FINDINGS: The left radius and ulna are intact and normally aligned. No acute fracture is seen. The left elbow joint is unremarkable on the images obtained. IMPRESSION: No  fracture. Electronically Signed   By: Dwyane Dee M.D.   On: 03/03/2016 16:26   Dg Forearm Right  Result Date: 03/03/2016 CLINICAL DATA:  Unresponsive, multiple bruises EXAM: RIGHT FOREARM - 2 VIEW COMPARISON:  None. FINDINGS: No fracture or dislocation is seen. The joint spaces are preserved. Visualized soft tissues are within normal limits. IMPRESSION: No fracture or dislocation is seen. Electronically Signed   By: Charline Bills M.D.   On: 03/03/2016 16:25   Dg Tibia/fibula Left  Result Date: 03/03/2016 CLINICAL DATA:  Found unresponsive on floor at home, probable fall EXAM: LEFT TIBIA AND FIBULA - 2 VIEW COMPARISON:  None. FINDINGS: Views of the left tibia and fibula were obtained. There does appear to be an abnormal bony density adjacent to the distal left fibula which may represent fracture. Views of the left ankle may be helpful to evaluate further. The ankle joint appears normal. IMPRESSION: Suspect fracture of the distal left fibula. Consider left ankle views. Electronically Signed   By: Dwyane Dee M.D.   On: 03/03/2016 16:34   Dg Tibia/fibula Right  Result Date: 03/03/2016 CLINICAL DATA:  Found unresponsive on the floor at home, probable fall EXAM: RIGHT TIBIA AND FIBULA - 2 VIEW COMPARISON:  None. FINDINGS: Views of the left tibia and fibula show no abnormality. No fracture is seen. Alignment is normal. IMPRESSION: Negative. Electronically Signed   By: Dwyane Dee M.D.   On: 03/03/2016 16:35   Ct Head Wo Contrast  Result Date: 03/03/2016 CLINICAL DATA:  Found unresponsive at home, unknown duration of time down, fall, hypertension, COPD, former smoker, dilated cardiomyopathy EXAM: CT HEAD WITHOUT CONTRAST CT CERVICAL SPINE WITHOUT CONTRAST TECHNIQUE: Multidetector CT imaging of the  head and cervical spine was performed following the standard protocol without intravenous contrast. Multiplanar CT image reconstructions of the cervical spine were also generated. COMPARISON:  CT head and  cervical spine 02/27/2016 FINDINGS: CT HEAD FINDINGS Minimal atrophy. Normal ventricular morphology. No midline shift or mass effect. Small vessel chronic ischemic changes of deep cerebral white matter. Scattered parenchymal calcifications. No intracranial hemorrhage, mass lesion, or evidence acute infarction. No extra-axial fluid collections. Atherosclerotic calcification of LEFT vertebral and BILATERAL internal carotid arteries at skullbase. Scattered mucosal thickening in the paranasal sinuses with tiny amounts of fluid dependently in the maxillary sinuses. No calvarial fractures. CT CERVICAL SPINE FINDINGS Atherosclerotic calcification of internal carotid and vertebral arteries. Large area of opacity at the RIGHT apex consistent with pneumonia or aspiration since no opacity/mass was identified on prior/recent CT. Endotracheal tube at clavicular heads, above carina. Beam hardening artifacts of dental origin and related to orthopedic hardware at the upper cervical spine. Diffuse osseous demineralization. Prior posterior occiputo-cervical fusion extending from occiput to T-2. Hardware intact. Degenerative disc disease with disc space narrowing and endplate spur formation at C3-C4 and C4-C5. Vertebral body heights maintained without fracture or subluxation. Odontoid appears intact. Prior decompression of the spinal canal with resection of the posterior elements at C1 through C6. IMPRESSION: Mild atrophy with small vessel chronic ischemic changes of deep cerebral white matter. No acute intracranial abnormalities. Prior occipitocervical fusion extending from occipital bone to T-2. Prior posterior spinal decompression of C1 through C6. No acute cervical spine abnormalities. Large area of new opacification at the RIGHT apex question pneumonia versus aspiration. Electronically Signed   By: Ulyses Southward M.D.   On: 03/03/2016 16:50   Ct Chest W Contrast  Result Date: 03/03/2016 CLINICAL DATA:  Found unresponsive at  home. Time down unknown. No one with pt to provide EXAM: CT CHEST, ABDOMEN, AND PELVIS WITH CONTRAST TECHNIQUE: Multidetector CT imaging of the chest, abdomen and pelvis was performed following the standard protocol during bolus administration of intravenous contrast. CONTRAST:  ISOVUE-300 IOPAMIDOL (ISOVUE-300) INJECTION 61% COMPARISON:  CT 04/16/2015 FINDINGS: CT CHEST FINDINGS Cardiovascular: Coronary artery calcification and aortic atherosclerotic calcification. Mediastinum/Nodes: No axillary or supraclavicular lymphadenopathy. Small a gas in the soft tissues of the thoracic inlet adjacent to strap muscles (image 5, series 2 and related to recent tracheostomy. Tracheostomy tube in good position. No mediastinal lymphadenopathy. No pericardial fluid. Esophagus normal. Lungs/Pleura: Dense consolidation within the RIGHT lower lobe and patchy consolidation RIGHT middle lobe and RIGHT upper lobe. Small effusion extends along the oblique fissure in the RIGHT hemi thorax. The RIGHT hemidiaphragm is elevated with a loop of colon beneath the RIGHT hemidiaphragm. The LEFT lung is relatively clear. The RIGHT lower lobe bronchus is difficult to follow. The bronchus intermedius is patent. The RIGHT middle lobe and RIGHT upper lobe bronchi are patent. Trachea normal. There is a tracheostomy tube in place. Musculoskeletal: No acute finding CT ABDOMEN PELVIS FINDINGS Hepatobiliary: No focal hepatic lesion. Large gallstone within lumen gallbladder without gallbladder distension. There is small amount of pericholecystic fluid. Pancreas: Normal pancreas Spleen: Normal spleen Adrenals/Urinary Tract: Adrenal glands and kidneys are normal. Ureters normal. Foley catheter in the bladder. Stomach/Bowel: Stomach, small bowel, appendix, cecum normal. The colon and rectosigmoid colon are normal. Vascular/Lymphatic: Abdominal aortic stent graft in place and well opacified. Reproductive: Prostate small Other: No adenopathy in the  abdomen pelvis. Musculoskeletal: No aggressive osseous lesion. Posterior lumbar fusion noted IMPRESSION: 1. Dense consolidation within the RIGHT lower lobe and patchy consolidation in the  RIGHT upper lobe. Differential includes multi focal lobar pneumonia versus mucous plugging versus a combination of both. Cannot exclude bronchogenic carcinoma but less favored. Patient may benefit from bronchoscopy. 2. RIGHT hemidiaphragm is significantly elevated. 3. Small amount fluid around the gallbladder with gallstones. Findings could represent acute or chronic cholecystitis. 4. Abdominal aortic stent graft in place with good opacification. Electronically Signed   By: Genevive Bi M.D.   On: 03/03/2016 16:51   Ct Cervical Spine Wo Contrast  Result Date: 03/03/2016 CLINICAL DATA:  Found unresponsive at home, unknown duration of time down, fall, hypertension, COPD, former smoker, dilated cardiomyopathy EXAM: CT HEAD WITHOUT CONTRAST CT CERVICAL SPINE WITHOUT CONTRAST TECHNIQUE: Multidetector CT imaging of the head and cervical spine was performed following the standard protocol without intravenous contrast. Multiplanar CT image reconstructions of the cervical spine were also generated. COMPARISON:  CT head and cervical spine 02/27/2016 FINDINGS: CT HEAD FINDINGS Minimal atrophy. Normal ventricular morphology. No midline shift or mass effect. Small vessel chronic ischemic changes of deep cerebral white matter. Scattered parenchymal calcifications. No intracranial hemorrhage, mass lesion, or evidence acute infarction. No extra-axial fluid collections. Atherosclerotic calcification of LEFT vertebral and BILATERAL internal carotid arteries at skullbase. Scattered mucosal thickening in the paranasal sinuses with tiny amounts of fluid dependently in the maxillary sinuses. No calvarial fractures. CT CERVICAL SPINE FINDINGS Atherosclerotic calcification of internal carotid and vertebral arteries. Large area of opacity at the  RIGHT apex consistent with pneumonia or aspiration since no opacity/mass was identified on prior/recent CT. Endotracheal tube at clavicular heads, above carina. Beam hardening artifacts of dental origin and related to orthopedic hardware at the upper cervical spine. Diffuse osseous demineralization. Prior posterior occiputo-cervical fusion extending from occiput to T-2. Hardware intact. Degenerative disc disease with disc space narrowing and endplate spur formation at C3-C4 and C4-C5. Vertebral body heights maintained without fracture or subluxation. Odontoid appears intact. Prior decompression of the spinal canal with resection of the posterior elements at C1 through C6. IMPRESSION: Mild atrophy with small vessel chronic ischemic changes of deep cerebral white matter. No acute intracranial abnormalities. Prior occipitocervical fusion extending from occipital bone to T-2. Prior posterior spinal decompression of C1 through C6. No acute cervical spine abnormalities. Large area of new opacification at the RIGHT apex question pneumonia versus aspiration. Electronically Signed   By: Ulyses Southward M.D.   On: 03/03/2016 16:50   Ct Abdomen Pelvis W Contrast  Result Date: 03/03/2016 CLINICAL DATA:  Found unresponsive at home. Time down unknown. No one with pt to provide EXAM: CT CHEST, ABDOMEN, AND PELVIS WITH CONTRAST TECHNIQUE: Multidetector CT imaging of the chest, abdomen and pelvis was performed following the standard protocol during bolus administration of intravenous contrast. CONTRAST:  ISOVUE-300 IOPAMIDOL (ISOVUE-300) INJECTION 61% COMPARISON:  CT 04/16/2015 FINDINGS: CT CHEST FINDINGS Cardiovascular: Coronary artery calcification and aortic atherosclerotic calcification. Mediastinum/Nodes: No axillary or supraclavicular lymphadenopathy. Small a gas in the soft tissues of the thoracic inlet adjacent to strap muscles (image 5, series 2 and related to recent tracheostomy. Tracheostomy tube in good position.  No mediastinal lymphadenopathy. No pericardial fluid. Esophagus normal. Lungs/Pleura: Dense consolidation within the RIGHT lower lobe and patchy consolidation RIGHT middle lobe and RIGHT upper lobe. Small effusion extends along the oblique fissure in the RIGHT hemi thorax. The RIGHT hemidiaphragm is elevated with a loop of colon beneath the RIGHT hemidiaphragm. The LEFT lung is relatively clear. The RIGHT lower lobe bronchus is difficult to follow. The bronchus intermedius is patent. The RIGHT middle lobe and  RIGHT upper lobe bronchi are patent. Trachea normal. There is a tracheostomy tube in place. Musculoskeletal: No acute finding CT ABDOMEN PELVIS FINDINGS Hepatobiliary: No focal hepatic lesion. Large gallstone within lumen gallbladder without gallbladder distension. There is small amount of pericholecystic fluid. Pancreas: Normal pancreas Spleen: Normal spleen Adrenals/Urinary Tract: Adrenal glands and kidneys are normal. Ureters normal. Foley catheter in the bladder. Stomach/Bowel: Stomach, small bowel, appendix, cecum normal. The colon and rectosigmoid colon are normal. Vascular/Lymphatic: Abdominal aortic stent graft in place and well opacified. Reproductive: Prostate small Other: No adenopathy in the abdomen pelvis. Musculoskeletal: No aggressive osseous lesion. Posterior lumbar fusion noted IMPRESSION: 1. Dense consolidation within the RIGHT lower lobe and patchy consolidation in the RIGHT upper lobe. Differential includes multi focal lobar pneumonia versus mucous plugging versus a combination of both. Cannot exclude bronchogenic carcinoma but less favored. Patient may benefit from bronchoscopy. 2. RIGHT hemidiaphragm is significantly elevated. 3. Small amount fluid around the gallbladder with gallstones. Findings could represent acute or chronic cholecystitis. 4. Abdominal aortic stent graft in place with good opacification. Electronically Signed   By: Genevive Bi M.D.   On: 03/03/2016 16:51   Dg  Humerus Left  Result Date: 03/03/2016 CLINICAL DATA:  Found unresponsive at home on floor. Fall. Trauma. Deformity. Initial encounter. EXAM: LEFT HUMERUS - 2+ VIEW COMPARISON:  None. FINDINGS: Degenerative glenohumeral joint space narrowing and marginal spurring are noted. The humerus appears intact without evidence of fracture. No focal osseous lesion or soft tissue abnormality is seen. IMPRESSION: No evidence of acute osseous abnormality. Electronically Signed   By: Sebastian Ache M.D.   On: 03/03/2016 16:27   Dg Humerus Right  Result Date: 03/03/2016 CLINICAL DATA:  Unresponsive, multiple bruises EXAM: RIGHT HUMERUS - 2+ VIEW COMPARISON:  Right shoulder radiographs dated 04/07/2014 FINDINGS: No fracture or dislocation is seen. The joint spaces are preserved. Visualized soft tissues are within normal limits. IMPRESSION: No fracture or dislocation is seen. Electronically Signed   By: Charline Bills M.D.   On: 03/03/2016 16:24   Dg Femur Min 2 Views Left  Result Date: 03/03/2016 CLINICAL DATA:  Found unresponsive on floor at home, probable fall EXAM: LEFT FEMUR 2 VIEWS COMPARISON:  None. FINDINGS: Views of the left humerus show no acute fracture. Alignment is normal. There is calcification of the superficial femoral artery present. IMPRESSION: No acute fracture. Electronically Signed   By: Dwyane Dee M.D.   On: 03/03/2016 16:33   Dg Femur, Min 2 Views Right  Result Date: 03/03/2016 CLINICAL DATA:  Found unresponsive at home, probable fall EXAM: RIGHT FEMUR 2 VIEWS COMPARISON:  None. FINDINGS: Views of the right femur show no fracture. Alignment is normal. No significant degenerative change is seen. IMPRESSION: Negative. Electronically Signed   By: Dwyane Dee M.D.   On: 03/03/2016 16:32    ____________________________________________   PROCEDURES  Procedure(s) performed:   .Critical Care Performed by: Loleta Rose Authorized by: Loleta Rose   Critical care provider statement:     Critical care time (minutes):  150   Critical care time was exclusive of:  Separately billable procedures and treating other patients   Critical care was necessary to treat or prevent imminent or life-threatening deterioration of the following conditions:  Trauma and respiratory failure   Critical care was time spent personally by me on the following activities:  Development of treatment plan with patient or surrogate, discussions with consultants, evaluation of patient's response to treatment, examination of patient, obtaining history from patient or surrogate, ordering  and performing treatments and interventions, ordering and review of laboratory studies, ordering and review of radiographic studies, pulse oximetry, re-evaluation of patient's condition and review of old charts .Intubation Date/Time: 03/03/2016 3:05 PM Performed by: Loleta Rose Authorized by: Loleta Rose   Consent:    Consent obtained:  Emergent situation Procedure details:    Preoxygenation:  Nonrebreather mask   Intubation method:  Oral   Oral intubation technique:  Video-assisted   Laryngoscope blade:  Mac 4 and Mac 3   Tube size (mm):  7.5   Tube type:  Cuffed   Number of attempts:  3 or more   Ventilation between attempts: yes (BVM)     Cricoid pressure: yes     Tube visualized through cords: yes   Placement assessment:    Tube secured with:  ETT holder   Breath sounds:  Equal and absent over the epigastrium   Placement verification: chest rise, CXR verification, equal breath sounds and ETCO2 detector     CXR findings:  ETT in proper place Post-procedure details:    Patient tolerance of procedure:  Tolerated with difficulty Comments:     Intubation attempts were unsuccessful as documented further below and hospital course.  Patient remained hemodynamically stable and with an SPO2 never lower than the low 90s throughout the attempts.     Critical Care performed: Yes, see critical care procedure  note(s) ____________________________________________   INITIAL IMPRESSION / ASSESSMENT AND PLAN / ED COURSE  Pertinent labs & imaging results that were available during my care of the patient were reviewed by me and considered in my medical decision making (see chart for details).    Clinical Course  Value Comment By Time   I was called emergently to the room at 11:20 AM very shortly after the patient arrived.  He was satting in the 60s per EMS on room air but he was 100% on nonrebreather.  However he had sonorous respirations and was minimally responsive to painful stimuli.  He has sequela of subacute wounds all over his body including on his forehead and all of his extremities including what appears to be dependent lividity.  Given the need to secure his airway, I proceeded with RSI.  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  I tried for approximately 40 minutes with a combination of Glidescope (sizes 3 and 4 blades), Bougie, and our fiberoptic scope which is not long enough to intubate over but which I thought might provide a view of the cords through which I could not pass the bougie.  Unfortunately none of these maneuvers were successful but the patient was easily baggable and his oxygen saturations never dropped below 92%.At this point I had him on a propofol drip and we consult anesthesiology.  Two CRNAs and Dr. Henrene Hawking (anesthesiology) arrived and were also unsuccessful at intubation attempts.  By that point the patient's airway was swelling presumably from the trauma of the attempt and he began desaturating.  He got down to 40% SPO2 and spite of bag-valve-mask before an LMA was placed by anesthesia and his oxygen saturation returned to 100%.  At this point we are awaiting emergent ENT consultation by Dr. Willeen Cass presumably for a surgical airway.  Loleta Rose, MD 09/13 1225   Dr. Willeen Cass is in the room.  The patient's vital signs are stable.  Dr.  Bennett and Dr. Henrene HawkingKephart are attempting to visualize the airway with a bronchoscope.  It is most likely that they will take him to the operating room for a trach.  At that point he will return to the emergency department to complete his emergency workup and presumably be admitted to the ICU.  Vital signs of note include a rectal temp of 92.6, stable BP on propofol drip, and HR around 105 bpm. Loleta Roseory Favour Aleshire, MD 09/13 1240  Calcium: (!!) 6.3 Calcium 6.3, but specialists are still working to secure airway . Will address when airway secure Loleta Roseory Kila Godina, MD 09/13 1245   Dr. Willeen CassBennett is taking the patient to the operating room to secure his airway.  He will return to the emergency department for continued workup after the airway is secure.  Hemodynamically stable at this time. Loleta Roseory Asami Lambright, MD 09/13 1302   The patient is back from the operating room with a tracheostomy.  He is satting 93% on the ventilator on 100% O2.  His blood pressure is low but he had been titrated up on propofol to 40 mcg/kg/min, so I am backing all the way down to 10 mcg/kg/m since he was stable and while sedated previously on only 5.  Now we are sending him to CT for pan scan.  His labs are remarkably normal except for a low calcium. Loleta Roseory Antasia Haider, MD 09/13 1432   The patient is getting an ABG at this time and then will go to the CT scanner.  His blood pressure is back up in the 120s systolic.  I ordered Levaquin 750 mg IV given the amount of instrumentation he received both before the surgical airway and during; I see no evidence of him having received prophylactic antibiotics.  Presently he does not meet sepsis criteria in spite of the obvious trauma.  We have a rectal temperature probe in place but I do not have an updated temperature yet.  I am awaiting the results of the imaging to determine if he requires transfer.  MB treated at this  facility.  I have started a third liter of fluids at 250 L/h. Loleta Roseory Tarence Searcy, MD 09/13 1456   I ordered Duonebs x 3 and received an allergy warning from the computer,  but I verified in his prior hospitalization that he uses duo nebs at home and he was discharged on them.  He uses them successfully done during his recent hospitalization and had no adverse reactions I think the benefit outweighs the risk particular since he has recently had them with no ill effects. Loleta Roseory Luddie Boghosian, MD 09/13 1507   I was just informed that the patient was taken first for his radiographs of his extremities before his CT scans.  Apparently this is because the CT scans were occupied at the time.  The CT scans should be next. Loleta Roseory Justa Hatchell, MD 09/13 1614   No acute abnormalities on his CT scans seventh of the possibility of a right-sided pneumonia/aspiration.  The radiologist and turbinates the bright artifact on his head CT as calcifications.  I discussed the case with Dr. Kathrin PennerMcQuade with the ICU and he agreed with my management thus far and encouraged adding aztreonam 2 g IV for the possibility of care associated pneumonia seen on the CT scan.  I have added on those antibiotics. Dr. Belia HemanKasa will admit. Loleta Roseory Arrie Borrelli, MD 09/13 1713   I discussed the case in person with Dr. Belia HemanKasa the intensivist.  He agrees with the management thus far and will care for  the patient in the ICU.  He and I are both concerned about hypoxic brain injury in the setting of an unknown period of time down after a probable respiratory arrest.  We evaluated his CT scan of the chest together and he has very severe dental consolidations in multiple lobes consistent with healthcare associated pneumonia. Loleta Rose, MD 09/13 1819    ____________________________________________  FINAL CLINICAL IMPRESSION(S) / ED DIAGNOSES  Final diagnoses:  Fall  Multiple leg contusions  Acute respiratory failure with hypoxia and hypercapnia (HCC)  Obtundation  Hypocalcemia  HCAP  (healthcare-associated pneumonia)     MEDICATIONS GIVEN DURING THIS VISIT:  Medications  propofol (DIPRIVAN) 1000 MG/100ML infusion (10 mcg/kg/min  104.3 kg Intravenous Rate/Dose Change 03/03/16 1430)  calcium gluconate 10 % injection (not administered)  0.9 %  sodium chloride infusion ( Intravenous New Bag/Given 03/03/16 1500)  aztreonam (AZACTAM) 2 g in dextrose 5 % 50 mL IVPB   etomidate (AMIDATE) injection 20 mg (20 mg Intravenous Given 03/03/16 1133)  rocuronium (ZEMURON) injection 100 mg (100 mg Intravenous Given 03/03/16 1133)  vecuronium (NORCURON) injection 10 mg (10 mg Intravenous Given 03/03/16 1207)  sodium chloride 0.9 % bolus 1,000 mL (0 mLs Intravenous Stopped 03/03/16 1435)  calcium gluconate 1 g in sodium chloride 0.9 % 100 mL IVPB (0 g Intravenous Stopped 03/03/16 1435)  ipratropium-albuterol (DUONEB) 0.5-2.5 (3) MG/3ML nebulizer solution 9 mL (9 mLs Nebulization Given 03/03/16 1514)  levofloxacin (LEVAQUIN) IVPB 750 mg (750 mg Intravenous New Bag/Given 03/03/16 1519)  iopamidol (ISOVUE-300) 61 % injection 100 mL (100 mLs Intravenous Contrast Given 03/03/16 1612)     NEW OUTPATIENT MEDICATIONS STARTED DURING THIS VISIT:  New Prescriptions   No medications on file    Modified Medications   No medications on file    Discontinued Medications   No medications on file     Note:  This document was prepared using Dragon voice recognition software and may include unintentional dictation errors.    Loleta Rose, MD 03/03/16 1715    Loleta Rose, MD 03/03/16 1610    Loleta Rose, MD 03/03/16 [id number]    Loleta Rose, MD 03/03/16 [phone]

## 2016-03-03 NOTE — Op Note (Signed)
03/03/2016  2:02 PM    Chad Huffman L Day  [id number]   Pre-Op Diagnosis:  Respiratory failure  Post-op Diagnosis: Same  Procedure:   Emergent tracheostomy  Surgeon:  Sandi MealyBennett, Georgena Weisheit S., MD  Anesthesia:  General with laryngeal mask airway  EBL:  25 cc  Complications:  None  Findings: n/a  Procedure: After the patient was identified in holding and the procedure was reviewed.  The patient was taken to the operating room, already sedated and intubated from the ICU, and with the patient in a comfortable supine position,  a proper time-out was performed, confirming the operative site and procedure.  The skin over the region of the trachea was injected with 1% lidocaine with epinephrine 1-200,000. The neck was then prepped and draped in the usual sterile fashion.  A 15 blade was used to incise the skin carrying the incision down through the subcutaneous tissues and the platysma with the Bovie. The strap muscles were identified and divided in the midline and retracted laterally. The thyroid isthmus was identified, divided in the midline with the Harmonic scalpel, and dissected laterally off of the surface of the trachea. The wound was inspected for proper hemostasis. The tracheal wall visualize the anesthesiologist was notified that we would be entering the airway. A 15 blade was used to incise between the second and third tracheal rings, and an inferiorly based flap was created by cutting through the third tracheal ring on either side. This flap was sutured to the skin with 3-0 silk suture to help create a stable tract, should the tracheostomy tube, out during the healing process. The anesthesiologist then pulled the endotracheal tube back until the tracheostomy tube could be passed into the trachea. A 8.0 Shiley cuffed endotracheal tube was placed without difficulty, the inner cannula placed, and the patient hooked up to the anesthesia circuit. The tracheostomy tube was then secured to the skin with  1-0 silk suture on either side of the flange, as well as a trach tie. A Betadine soaked dressing was placed around the tracheostomy tube.  The patient was then returned to the anesthesiologist for ventilation and taken to the recovery room in guarded condition.  The patient was then returned to the anesthesiologist in good condition for awakening. The patient was awakened and taken to the recovery room in good condition.   Disposition:   To recovery room in guarded condition  Plan: Patient will initially go to the recovery room, then back to the emergency room for further workup    Sandi MealyBennett, Juddson Cobern S 03/03/2016 2:02 PM

## 2016-03-03 NOTE — Progress Notes (Signed)
Pt's home meds sent to pharmacy

## 2016-03-03 NOTE — Consult Note (Signed)
Chad Huffman, Chad Huffman [id number] 01/28/1946 Riley Nearing, MD  Reason for Consult: Respiratory distress Requesting Physician: Hinda Kehr, MD Consulting Physician: Riley Nearing, MD  HPI: This 70 y.o. year old male was admitted on 03/03/2016 for unresponsiveness with respiratory arrest. He came in with an O2 sat of 60%. He recently was discharged from the hospital for a COPD exacerbation and was found unresponsive in his home. It is unclear what transpired to lead to the unresponsiveness. There is some injuries that could indicate a fall. Attempts to intubate him in the emergency room were unsuccessful however anesthesia was able to get an LMA in place and is currently successfully ventilating him. I am consulted for airway assistance and probable tracheostomy.  Medications:  Current Facility-Administered Medications  Medication Dose Route Frequency Provider Last Rate Last Dose  . calcium gluconate 10 % injection           . calcium gluconate 1 g in sodium chloride 0.9 % 100 mL IVPB  1 g Intravenous Once Hinda Kehr, MD 330 mL/hr at 03/03/16 1257 1 g at 03/03/16 1257  . propofol (DIPRIVAN) 1000 MG/100ML infusion  5 mcg/kg/min Intravenous Continuous Hinda Kehr, MD 3.1 mL/hr at 03/03/16 1201 5 mcg/kg/min at 03/03/16 1201   Current Outpatient Prescriptions  Medication Sig Dispense Refill  . ALPRAZolam (XANAX) 0.5 MG tablet Take 0.25-0.5 mg by mouth every 8 (eight) hours as needed for anxiety.     Marland Kitchen aspirin EC 81 MG tablet Take 81 mg by mouth daily.    . baclofen (LIORESAL) 20 MG tablet Take 20 mg by mouth 3 (three) times daily as needed. For muscle spasms.    . budesonide-formoterol (SYMBICORT) 160-4.5 MCG/ACT inhaler Inhale 2 puffs into the lungs 2 (two) times daily.    . diazepam (VALIUM) 2 MG tablet Take 1 tablet (2 mg total) by mouth every 8 (eight) hours as needed for muscle spasms. 30 tablet 0  . etodolac (LODINE) 500 MG tablet Take 500 mg by mouth 2 (two) times daily.    . furosemide  (LASIX) 40 MG tablet Take 1 tablet (40 mg total) by mouth 2 (two) times daily. 60 tablet 0  . Ipratropium-Albuterol (COMBIVENT RESPIMAT) 20-100 MCG/ACT AERS respimat Inhale 1 puff into the lungs every 6 (six) hours. 1 Inhaler 0  . ipratropium-albuterol (DUONEB) 0.5-2.5 (3) MG/3ML SOLN Take 3 mLs by nebulization every 6 (six) hours. (Patient taking differently: Take 3 mLs by nebulization every 6 (six) hours as needed (for wheezing/shortness of breath). ) 360 mL 0  . ketoconazole (NIZORAL) 2 % shampoo Apply 1 application topically 2 (two) times a week. Leave on 5 minutes then rinse well.  0  . levothyroxine (SYNTHROID, LEVOTHROID) 125 MCG tablet Take 1 tablet (125 mcg total) by mouth daily before breakfast. 30 tablet 0  . lisinopril (PRINIVIL,ZESTRIL) 10 MG tablet Take 10 mg by mouth daily.    . mirtazapine (REMERON) 15 MG tablet Take 15 mg by mouth at bedtime.    . Multiple Vitamin (MULTIVITAMIN WITH MINERALS) TABS tablet Take 1 tablet by mouth daily.    Marland Kitchen omeprazole (PRILOSEC) 20 MG capsule Take 20 mg by mouth daily.    Marland Kitchen oxyCODONE (ROXICODONE) 5 MG immediate release tablet Take 1 tablet (5 mg total) by mouth every 8 (eight) hours as needed. 20 tablet 0  . sertraline (ZOLOFT) 100 MG tablet Take 100 mg by mouth daily.    .  (Not in a hospital admission)  Allergies:  Allergies  Allergen Reactions  . Ampicillin  Swelling and Other (See Comments)    Unable to obtain enough information to answer additional questions about this medication.  Has patient had a PCN reaction causing immediate rash, facial/tongue/throat swelling, SOB or lightheadedness with hypotension: Yes Has patient had a PCN reaction causing severe rash involving mucus membranes or skin necrosis: No Has patient had a PCN reaction that required hospitalization No Has patient had a PCN reaction occurring within the last 10 years: Yes If all of the above answers are "NO", then may pr  . Nortriptyline Other (See Comments)    Sleep  walking  . Brimonidine Other (See Comments)    Reaction:  Unknown   . Cyclobenzaprine Other (See Comments)    Reaction: Hallucinations  . Tizanidine Other (See Comments)    Reaction: Hallucinations  . Gabapentin Rash    PMH:  Past Medical History:  Diagnosis Date  . Antalgic gait   . Carotid artery occlusion   . Chronic neck and back pain   . Complication of anesthesia    pt has had cervical fusion-limited neck flextion  . COPD (chronic obstructive pulmonary disease) ([place])   . Depression   . DJD (degenerative joint disease)    Right shoulder  . Hypertension   . Lumbago   . Peripheral neuropathy ([place])     Fam Hx:  Family History  Problem Relation Age of Onset  . Cancer Mother     Stomach  . Hypertension Brother   . Heart attack Father     Soc Hx:  Social History   Social History  . Marital status: Single    Spouse name: N/A  . Number of children: N/A  . Years of education: N/A   Occupational History  . Not on file.   Social History Main Topics  . Smoking status: Former Smoker    Quit date: 08/30/2006  . Smokeless tobacco: Never Used  . Alcohol use 1.2 oz/week    2 Cans of beer per week     Comment: occasional use  . Drug use: No  . Sexual activity: Not on file   Other Topics Concern  . Not on file   Social History Narrative  . No narrative on file    PSH:  Past Surgical History:  Procedure Laterality Date  . ABDOMINAL AORTIC ANEURYSM REPAIR  2009   Endovascular AAA repair  . ABDOMINAL AORTIC ANEURYSM REPAIR    . CARPAL TUNNEL RELEASE     bilateral CTS  . HAMMER TOE SURGERY  12/10/2011   Procedure: HAMMER TOE CORRECTION;  Surgeon: Tyson Dense, DPM;  Location: [facility];  Service: Podiatry;  Laterality: Right;  Right hammertoe repair second right with 2.5x42 orthopro screw   . METATARSAL OSTEOTOMY  12/10/2011   Procedure: METATARSAL OSTEOTOMY;  Surgeon: Tyson Dense, DPM;  Location: [facility];  Service: Podiatry;   Laterality: Right;  Right Keller arthroplasty with size 5 silicone implant right great toe; second metatarsal osteotomy with 2.0x14 screw  . NECK SURGERY    . PERIPHERAL VASCULAR CATHETERIZATION N/A 02/11/2015   Procedure: Carotid Angiography;  Surgeon: Serafina Mitchell, MD;  Location: [facility];  Service: Cardiovascular;  Laterality: N/A;  . PILONIDAL CYST / SINUS EXCISION    . Rotary cuff    . SPINE SURGERY  09/15/11   Scar tissue removed- back  . SPINE SURGERY  09/15/11   Scar tissue removed- back  . TONSILLECTOMY    . Procedures since admission: No admission procedures for  hospital encounter.  ROS: Review of systems normal other than 12 systems except per HPI.  PHYSICAL EXAM  Vitals: Blood pressure 135/75, pulse (!) 104, temperature (S) (!) 92.6 F (33.7 C), temperature source (S) Rectal, resp. rate 14, height 5' 8"  (1.727 m), weight 104.3 kg (230 lb), SpO2 97 %.. General: Well-developed, obese male, being ventilated with an LMA. He is unresponsive. Mood: Unable to assess  Orientation: Obtunded. Vocal Quality: Not applicable head and Face: NCAT. No facial asymmetry. No visible skin lesions. No significant facial scars. No tenderness with sinus percussion. Facial strength normal and symmetric. Ears: External ears with normal landmarks, no lesions. External auditory canals free of infection, cerumen impaction or lesions. Tympanic membranes intact with good landmarks and normal mobility on pneumatic otoscopy. No middle ear effusion. Hearing: Unable to assess. Nose: External nose normal with midline dorsum and no lesions or deformity. Nasal Cavity reveals essentially midline septum with normal inferior turbinates. No significant mucosal congestion or erythema. Nasal secretions are minimal and clear. No polyps seen on anterior rhinoscopy. Oral Cavity/ Oropharynx: Lips are normal with no lesions. Teeth no frank dental caries. Gingiva healthy with no lesions or gingivitis. Oropharynx  including tongue, buccal mucosa, floor of mouth, hard and soft palate, uvula and posterior pharynx free of exudates, erythema or lesions with normal symmetry and hydration. Exam is limited due to the presence of an LMA Indirect Laryngoscopy/Nasopharyngoscopy: Visualization of the larynx, hypopharynx and nasopharynx is not possible in this setting with routine examination. Neck: Supple and symmetric with no palpable masses, tenderness or crepitance. The trachea is midline. Thyroid gland is soft, nontender and symmetric with no masses or enlargement. Parotid and submandibular glands are soft, nontender and symmetric, without masses. Lymphatic: Cervical lymph nodes are without palpable lymphadenopathy or tenderness. Respiratory: Breathing with back ventilation assistance, oxygen saturations 100% Cardiovascular: Carotid pulse shows regular rate and rhythm Neurologic: Unable to assess Eyes: Unable to assess   MEDICAL DECISION MAKING:  Attempts were made to pass a disposable bronchoscope through the LMA in hopes of passing it into the larynx to the trachea. The thought was that if this were possible, the LMA could be removed, and an endotracheal tube passed over the fiberoptic scope (this was a disposable scope, so we could potentially cut the fiberoptics free and use as a bougie). Several attempts were made but this was unfortunately unsuccessful.  Data Review:  Results for orders placed or performed during the hospital encounter of 03/03/16 (from the past 48 hour(s))  CBC     Status: Abnormal   Collection Time: 03/03/16 11:40 AM  Result Value Ref Range   WBC 10.6 3.8 - 10.6 K/uL   RBC 3.09 (L) 4.40 - 5.90 MIL/uL   Hemoglobin 9.0 (L) 13.0 - 18.0 g/dL   HCT 27.4 (L) 40.0 - 52.0 %   MCV 88.8 80.0 - 100.0 fL   MCH 29.2 26.0 - 34.0 pg   MCHC 32.9 32.0 - 36.0 g/dL   RDW 17.6 (H) 11.5 - 14.5 %   Platelets 129 (L) 150 - 440 K/uL  Comprehensive metabolic panel     Status: Abnormal   Collection Time:  03/03/16 11:40 AM  Result Value Ref Range   Sodium 136 135 - 145 mmol/L   Potassium 3.3 (L) 3.5 - 5.1 mmol/L   Chloride 106 101 - 111 mmol/L   CO2 26 22 - 32 mmol/L   Glucose, Bld 101 (H) 65 - 99 mg/dL   BUN 42 (H) 6 - 20 mg/dL  Creatinine, Ser 1.03 0.61 - 1.24 mg/dL   Calcium 6.3 (LL) 8.9 - 10.3 mg/dL    Comment: CRITICAL RESULT CALLED TO, READ BACK BY AND VERIFIED WITH TRICIA SURLES AT 1242 ON 03/03/16.Marland KitchenMarland KitchenDonalsonville Hospital    Total Protein 5.2 (L) 6.5 - 8.1 g/dL   Albumin 2.7 (L) 3.5 - 5.0 g/dL   AST 25 15 - 41 U/L   ALT 14 (L) 17 - 63 U/L   Alkaline Phosphatase 42 38 - 126 U/L   Total Bilirubin 0.9 0.3 - 1.2 mg/dL   GFR calc non Af Amer >60 >60 mL/min   GFR calc Af Amer >60 >60 mL/min    Comment: (NOTE) The eGFR has been calculated using the CKD EPI equation. This calculation has not been validated in all clinical situations. eGFR's persistently <60 mL/min signify possible Chronic Kidney Disease.    Anion gap 4 (L) 5 - 15  Lactic acid, plasma     Status: None   Collection Time: 03/03/16 11:40 AM  Result Value Ref Range   Lactic Acid, Venous 0.8 0.5 - 1.9 mmol/L  Ammonia     Status: None   Collection Time: 03/03/16 11:40 AM  Result Value Ref Range   Ammonia 22 9 - 35 umol/L  Urinalysis complete, with microscopic ([facility] only)     Status: Abnormal   Collection Time: 03/03/16 11:40 AM  Result Value Ref Range   Color, Urine YELLOW (A) YELLOW   APPearance CLEAR (A) CLEAR   Glucose, UA NEGATIVE NEGATIVE mg/dL   Bilirubin Urine NEGATIVE NEGATIVE   Ketones, ur NEGATIVE NEGATIVE mg/dL   Specific Gravity, Urine 1.015 1.005 - 1.030   Hgb urine dipstick 1+ (A) NEGATIVE   pH 5.0 5.0 - 8.0   Protein, ur 30 (A) NEGATIVE mg/dL   Nitrite NEGATIVE NEGATIVE   Leukocytes, UA 1+ (A) NEGATIVE   RBC / HPF 0-5 0 - 5 RBC/hpf   WBC, UA 6-30 0 - 5 WBC/hpf   Bacteria, UA RARE (A) NONE SEEN   Squamous Epithelial / LPF 0-5 (A) NONE SEEN   Mucous PRESENT   Glucose, capillary     Status: Abnormal    Collection Time: 03/03/16 11:48 AM  Result Value Ref Range   Glucose-Capillary 119 (H) 65 - 99 mg/dL  . No results found..   ASSESSMENT: Patient found unresponsive with respiratory failure, currently with his airway managed by an LMA, which unfortunately will not protect his airway and provide adequate long term ventilation. Attempts to pass a scope into the trachea in hopes of treating out and endotracheal tube were unsuccessful. The patient was not able to be intubated. He has a difficult airway due to edema, obesity, and spinal fusion.  PLAN: The patient is to be taken to the operating room for emergent tracheostomy. Afterwards he will be returned to the emergency room for further workup of the causes of his respiratory failure.   Riley Nearing, MD 03/03/2016 1:05 PM

## 2016-03-03 NOTE — Anesthesia Preprocedure Evaluation (Addendum)
Anesthesia Evaluation  Patient identified by MRN, date of birth, ID band Patient unresponsive    Reviewed: Unable to perform ROS - Chart review only  Airway Mallampati: IV       Dental   Pulmonary COPD, former smoker,           Cardiovascular hypertension,      Neuro/Psych Depression    GI/Hepatic   Endo/Other  Hypothyroidism   Renal/GU      Musculoskeletal  (+) Arthritis , Osteoarthritis,    Abdominal   Peds  Hematology   Anesthesia Other Findings   Reproductive/Obstetrics                            Anesthesia Physical Anesthesia Plan  ASA: IV and emergent  Anesthesia Plan:    Post-op Pain Management:    Induction: Intravenous  Airway Management Planned: LMA  Additional Equipment:   Intra-op Plan:   Post-operative Plan:   Informed Consent: I have reviewed the patients History and Physical, chart, labs and discussed the procedure including the risks, benefits and alternatives for the proposed anesthesia with the patient or authorized representative who has indicated his/her understanding and acceptance.     Plan Discussed with:   Anesthesia Plan Comments:         Anesthesia Quick Evaluation

## 2016-03-04 ENCOUNTER — Encounter: Payer: Self-pay | Admitting: Otolaryngology

## 2016-03-04 ENCOUNTER — Inpatient Hospital Stay: Payer: Medicare Other

## 2016-03-04 DIAGNOSIS — J690 Pneumonitis due to inhalation of food and vomit: Secondary | ICD-10-CM

## 2016-03-04 LAB — URINE CULTURE
Culture: NO GROWTH
Special Requests: NORMAL

## 2016-03-04 LAB — BLOOD GAS, ARTERIAL
Acid-Base Excess: 1.4 mmol/L (ref 0.0–2.0)
BICARBONATE: 28.5 mmol/L — AB (ref 20.0–28.0)
FIO2: 1
MECHANICAL RATE: 15
MECHVT: 500 mL
O2 Saturation: 92.7 %
PEEP/CPAP: 10 cmH2O
Patient temperature: 37
pCO2 arterial: 58 mmHg — ABNORMAL HIGH (ref 32.0–48.0)
pH, Arterial: 7.3 — ABNORMAL LOW (ref 7.350–7.450)
pO2, Arterial: 73 mmHg — ABNORMAL LOW (ref 83.0–108.0)

## 2016-03-04 LAB — CBC
HCT: 30.4 % — ABNORMAL LOW (ref 40.0–52.0)
Hemoglobin: 9.7 g/dL — ABNORMAL LOW (ref 13.0–18.0)
MCH: 28.4 pg (ref 26.0–34.0)
MCHC: 32 g/dL (ref 32.0–36.0)
MCV: 88.8 fL (ref 80.0–100.0)
Platelets: 160 10*3/uL (ref 150–440)
RBC: 3.42 MIL/uL — ABNORMAL LOW (ref 4.40–5.90)
RDW: 17.7 % — AB (ref 11.5–14.5)
WBC: 26.9 10*3/uL — ABNORMAL HIGH (ref 3.8–10.6)

## 2016-03-04 LAB — BASIC METABOLIC PANEL
Anion gap: 6 (ref 5–15)
BUN: 43 mg/dL — AB (ref 6–20)
CHLORIDE: 102 mmol/L (ref 101–111)
CO2: 27 mmol/L (ref 22–32)
CREATININE: 1.4 mg/dL — AB (ref 0.61–1.24)
Calcium: 7.6 mg/dL — ABNORMAL LOW (ref 8.9–10.3)
GFR calc Af Amer: 58 mL/min — ABNORMAL LOW (ref >60)
GFR calc non Af Amer: 50 mL/min — ABNORMAL LOW (ref >60)
Glucose, Bld: 115 mg/dL — ABNORMAL HIGH (ref 65–99)
Potassium: 4.8 mmol/L (ref 3.5–5.1)
SODIUM: 135 mmol/L (ref 135–145)

## 2016-03-04 MED ORDER — METHYLPREDNISOLONE SODIUM SUCC 125 MG IJ SOLR
60.0000 mg | Freq: Two times a day (BID) | INTRAMUSCULAR | Status: AC
  Administered 2016-03-04 – 2016-03-08 (×9): 60 mg via INTRAVENOUS
  Filled 2016-03-04 (×9): qty 2

## 2016-03-04 MED ORDER — BUDESONIDE 0.25 MG/2ML IN SUSP
0.2500 mg | Freq: Two times a day (BID) | RESPIRATORY_TRACT | Status: DC
  Administered 2016-03-04 – 2016-03-09 (×11): 0.25 mg via RESPIRATORY_TRACT
  Filled 2016-03-04 (×12): qty 2

## 2016-03-04 MED ORDER — IPRATROPIUM-ALBUTEROL 0.5-2.5 (3) MG/3ML IN SOLN
3.0000 mL | RESPIRATORY_TRACT | Status: DC
  Administered 2016-03-04 – 2016-03-09 (×30): 3 mL via RESPIRATORY_TRACT
  Filled 2016-03-04 (×29): qty 3

## 2016-03-04 MED ORDER — DEXMEDETOMIDINE HCL IN NACL 400 MCG/100ML IV SOLN
0.4000 ug/kg/h | INTRAVENOUS | Status: DC
  Administered 2016-03-04: 0.6 ug/kg/h via INTRAVENOUS
  Administered 2016-03-04: 0.4 ug/kg/h via INTRAVENOUS
  Administered 2016-03-04: 0.6 ug/kg/h via INTRAVENOUS
  Administered 2016-03-05: 1 ug/kg/h via INTRAVENOUS
  Administered 2016-03-05 (×2): 0.6 ug/kg/h via INTRAVENOUS
  Filled 2016-03-04 (×6): qty 100

## 2016-03-04 MED ORDER — ACETAMINOPHEN 650 MG RE SUPP: 650.0000 mg | RECTAL | Status: DC | PRN

## 2016-03-04 MED ORDER — LEVOFLOXACIN IN D5W 750 MG/150ML IV SOLN
750.0000 mg | INTRAVENOUS | Status: DC
  Administered 2016-03-04 – 2016-03-08 (×5): 750 mg via INTRAVENOUS
  Filled 2016-03-04 (×7): qty 150

## 2016-03-04 NOTE — Progress Notes (Signed)
[facility]  Patient Status: not opened yet, unable to make contact   [facility] is providing the following services: SN/PT ordered last admission, [facility], notified CM Collie Siadngela Johnson  If patient discharges after hours, please call [phone].   Dimple CaseyJason E Hinton 03/04/2016, 8:14 AM

## 2016-03-04 NOTE — Progress Notes (Signed)
Notified Dr. Belia HemanKasa that patient is alert and oriented- follows commands and writes on paper to commuicate needs.  Patient on 100% fi02 and 10 of PEEP but is sating 91-92.  Dr. Belia HemanKasa ordered to place patient in pressure support mode- I notified John with [facility] about this order.

## 2016-03-04 NOTE — Plan of Care (Signed)
MD was made aware per tele command pt had 8 beats of vtach. Will cont to monitor

## 2016-03-04 NOTE — Progress Notes (Signed)
Pt respiration rate per vent at 60.  Pt C/O pain after PRN medications administered.  Called [facility].  Spoke with Dr Jamison NeighborNestor who examined patient via in room cam.  Dr Jamison NeighborNestor suggested switching out vent.  Respiratory informed.

## 2016-03-04 NOTE — Progress Notes (Signed)
PULMONARY / CRITICAL CARE MEDICINE   Name: Chad Huffman MRN: [id number] DOB: Dec 04, 1945    ADMISSION DATE:  03/03/2016 CHIEF COMPLAINT:  Acute resp failure   STUDIES:  CT chest -RT sided multifocal opacities c/w pneumonia  SIGNIFICANT EVENTS: 9/13-failed intubation, emergent trach   HISTORY OF PRESENT ILLNESS:  70 yo white male seen for acute resp failure Patient was found unresponsive at home after sister found patient, EMS arrived and noted to have posturing and o2sats in 60's Patient was bought to ER, GCS<8, not protecting airway, increased WOB patient had multiple attempts at intubation and subsequently went to OR for emergent trach by ENT  Patient currently unresponsive, gcs<8T on vent s/p trach  - It is unknown how long he was down but he was discharged from the hospital 3 days ago after an acute respiratory failure episode with hypoxia and hypoxemia to be secondary to his COPD.    -He has extensive sequela of traumatic injury including what appears to be dependent lividity on his extremities, bruising on his lower extremities, dependent edema in his scrotum, swelling around his eyes and chemosis more notable on the left than the right, all consistent with being down for an extended period of time.  No other history is currently available.   9/13 emergent  Trach 9/14 assess neuro status    REVIEW OF SYSTEMS: unobtainable due to critical illness    VITAL SIGNS: Temp:  [91.9 F (33.3 C)-100.2 F (37.9 C)] 99 F (37.2 C) (09/14 0800) Pulse Rate:  [55-113] 81 (09/14 0800) Resp:  [0-30] 16 (09/14 0800) BP: (85-194)/(45-103) 104/51 (09/14 0800) SpO2:  [60 %-100 %] 92 % (09/14 0800) FiO2 (%):  [100 %] 100 % (09/14 0754) Weight:  [230 lb (104.3 kg)-242 lb 1 oz (109.8 kg)] 242 lb 1 oz (109.8 kg) (09/14 0500) HEMODYNAMICS:   VENTILATOR SETTINGS: Vent Mode: PRVC FiO2 (%):  [100 %] 100 % Set Rate:  [14 bmp-16 bmp] 15 bmp Vt Set:  [500 mL] 500 mL PEEP:  [10 cmH20] 10  cmH20 INTAKE / OUTPUT:  Intake/Output Summary (Last 24 hours) at 03/04/16 4098 Last data filed at 03/04/16 0650  Gross per 24 hour  Intake           4438.8 ml  Output             1405 ml  Net           3033.8 ml    PHYSICAL EXAMINATION: PHYSICAL EXAMINATION: Physical Examination:   GENERAL:critically ill appearing, s/p trach HEAD: Normocephalic, atraumatic.  EYES: Pupils equal, round, reactive to light.  No scleral icterus.  MOUTH: Moist mucosal membrane. NECK: Supple. No thyromegaly. No nodules. No JVD. c collar in place s/p trach PULMONARY: Diffuse coarse rhonchi right sided +wheezes +rhonchi CARDIOVASCULAR: S1 and S2. Regular rate and rhythm. No murmurs, rubs, or gallops.  GASTROINTESTINAL: Soft, nontender, +distended. No masses. Positive bowel sounds. No hepatosplenomegaly.  MUSCULOSKELETAL: No swelling, clubbing, or edema.  NEUROLOGIC: GCS<8T SKIN:intact,warm,dry eccymosis, scrotal edema    ABX  9/13 Vancomycin/Aztreonam--> Cultures pending  CBC  Recent Labs Lab 02/28/16 0414 03/03/16 1140 03/04/16 0621  WBC 7.0 10.6 26.9*  HGB 9.8* 9.0* 9.7*  HCT 29.0* 27.4* 30.4*  PLT 137* 129* 160   Coag's No results for input(s): APTT, INR in the last 168 hours. BMET  Recent Labs Lab 02/28/16 0414 03/03/16 1140 03/04/16 0621  NA 135 136 135  K 4.0 3.3* 4.8  CL 95* 106 102  CO2 34* 26  27  BUN 17 42* 43*  CREATININE 0.88 1.03 1.40*  GLUCOSE 90 101* 115*   Electrolytes  Recent Labs Lab 02/28/16 0414 03/03/16 1140 03/04/16 0621  CALCIUM 8.6* 6.3* 7.6*   Sepsis Markers  Recent Labs Lab 03/03/16 1140  LATICACIDVEN 0.8   ABG  Recent Labs Lab 02/28/16 0000 03/03/16 1443 03/04/16 0444  PHART 7.41 7.29* 7.30*  PCO2ART 59* 63* 58*  PO2ART 76* 51* 73*   Liver Enzymes  Recent Labs Lab 03/03/16 1140  AST 25  ALT 14*  ALKPHOS 42  BILITOT 0.9  ALBUMIN 2.7*   Cardiac Enzymes  Recent Labs Lab 02/27/16 1617  TROPONINI <0.03    Glucose  Recent Labs Lab 02/27/16 2247 03/03/16 1148 03/03/16 2101  GLUCAP 105* 119* 137*    Imaging Dg Forearm Left  Result Date: 03/03/2016 CLINICAL DATA:  Found unresponsive at home with bruising, probable fall EXAM: LEFT FOREARM - 2 VIEW COMPARISON:  None. FINDINGS: The left radius and ulna are intact and normally aligned. No acute fracture is seen. The left elbow joint is unremarkable on the images obtained. IMPRESSION: No fracture. Electronically Signed   By: Dwyane Dee M.D.   On: 03/03/2016 16:26   Dg Forearm Right  Result Date: 03/03/2016 CLINICAL DATA:  Unresponsive, multiple bruises EXAM: RIGHT FOREARM - 2 VIEW COMPARISON:  None. FINDINGS: No fracture or dislocation is seen. The joint spaces are preserved. Visualized soft tissues are within normal limits. IMPRESSION: No fracture or dislocation is seen. Electronically Signed   By: Charline Bills M.D.   On: 03/03/2016 16:25   Dg Tibia/fibula Left  Result Date: 03/03/2016 CLINICAL DATA:  Found unresponsive on floor at home, probable fall EXAM: LEFT TIBIA AND FIBULA - 2 VIEW COMPARISON:  None. FINDINGS: Views of the left tibia and fibula were obtained. There does appear to be an abnormal bony density adjacent to the distal left fibula which may represent fracture. Views of the left ankle may be helpful to evaluate further. The ankle joint appears normal. IMPRESSION: Suspect fracture of the distal left fibula. Consider left ankle views. Electronically Signed   By: Dwyane Dee M.D.   On: 03/03/2016 16:34   Dg Tibia/fibula Right  Result Date: 03/03/2016 CLINICAL DATA:  Found unresponsive on the floor at home, probable fall EXAM: RIGHT TIBIA AND FIBULA - 2 VIEW COMPARISON:  None. FINDINGS: Views of the left tibia and fibula show no abnormality. No fracture is seen. Alignment is normal. IMPRESSION: Negative. Electronically Signed   By: Dwyane Dee M.D.   On: 03/03/2016 16:35   Ct Head Wo Contrast  Result Date:  03/03/2016 CLINICAL DATA:  Found unresponsive at home, unknown duration of time down, fall, hypertension, COPD, former smoker, dilated cardiomyopathy EXAM: CT HEAD WITHOUT CONTRAST CT CERVICAL SPINE WITHOUT CONTRAST TECHNIQUE: Multidetector CT imaging of the head and cervical spine was performed following the standard protocol without intravenous contrast. Multiplanar CT image reconstructions of the cervical spine were also generated. COMPARISON:  CT head and cervical spine 02/27/2016 FINDINGS: CT HEAD FINDINGS Minimal atrophy. Normal ventricular morphology. No midline shift or mass effect. Small vessel chronic ischemic changes of deep cerebral white matter. Scattered parenchymal calcifications. No intracranial hemorrhage, mass lesion, or evidence acute infarction. No extra-axial fluid collections. Atherosclerotic calcification of LEFT vertebral and BILATERAL internal carotid arteries at skullbase. Scattered mucosal thickening in the paranasal sinuses with tiny amounts of fluid dependently in the maxillary sinuses. No calvarial fractures. CT CERVICAL SPINE FINDINGS Atherosclerotic calcification of internal carotid and vertebral  arteries. Large area of opacity at the RIGHT apex consistent with pneumonia or aspiration since no opacity/mass was identified on prior/recent CT. Endotracheal tube at clavicular heads, above carina. Beam hardening artifacts of dental origin and related to orthopedic hardware at the upper cervical spine. Diffuse osseous demineralization. Prior posterior occiputo-cervical fusion extending from occiput to T-2. Hardware intact. Degenerative disc disease with disc space narrowing and endplate spur formation at C3-C4 and C4-C5. Vertebral body heights maintained without fracture or subluxation. Odontoid appears intact. Prior decompression of the spinal canal with resection of the posterior elements at C1 through C6. IMPRESSION: Mild atrophy with small vessel chronic ischemic changes of deep  cerebral white matter. No acute intracranial abnormalities. Prior occipitocervical fusion extending from occipital bone to T-2. Prior posterior spinal decompression of C1 through C6. No acute cervical spine abnormalities. Large area of new opacification at the RIGHT apex question pneumonia versus aspiration. Electronically Signed   By: Ulyses Southward M.D.   On: 03/03/2016 16:50   Ct Chest W Contrast  Result Date: 03/03/2016 CLINICAL DATA:  Found unresponsive at home. Time down unknown. No one with pt to provide EXAM: CT CHEST, ABDOMEN, AND PELVIS WITH CONTRAST TECHNIQUE: Multidetector CT imaging of the chest, abdomen and pelvis was performed following the standard protocol during bolus administration of intravenous contrast. CONTRAST:  ISOVUE-300 IOPAMIDOL (ISOVUE-300) INJECTION 61% COMPARISON:  CT 04/16/2015 FINDINGS: CT CHEST FINDINGS Cardiovascular: Coronary artery calcification and aortic atherosclerotic calcification. Mediastinum/Nodes: No axillary or supraclavicular lymphadenopathy. Small a gas in the soft tissues of the thoracic inlet adjacent to strap muscles (image 5, series 2 and related to recent tracheostomy. Tracheostomy tube in good position. No mediastinal lymphadenopathy. No pericardial fluid. Esophagus normal. Lungs/Pleura: Dense consolidation within the RIGHT lower lobe and patchy consolidation RIGHT middle lobe and RIGHT upper lobe. Small effusion extends along the oblique fissure in the RIGHT hemi thorax. The RIGHT hemidiaphragm is elevated with a loop of colon beneath the RIGHT hemidiaphragm. The LEFT lung is relatively clear. The RIGHT lower lobe bronchus is difficult to follow. The bronchus intermedius is patent. The RIGHT middle lobe and RIGHT upper lobe bronchi are patent. Trachea normal. There is a tracheostomy tube in place. Musculoskeletal: No acute finding CT ABDOMEN PELVIS FINDINGS Hepatobiliary: No focal hepatic lesion. Large gallstone within lumen gallbladder without  gallbladder distension. There is small amount of pericholecystic fluid. Pancreas: Normal pancreas Spleen: Normal spleen Adrenals/Urinary Tract: Adrenal glands and kidneys are normal. Ureters normal. Foley catheter in the bladder. Stomach/Bowel: Stomach, small bowel, appendix, cecum normal. The colon and rectosigmoid colon are normal. Vascular/Lymphatic: Abdominal aortic stent graft in place and well opacified. Reproductive: Prostate small Other: No adenopathy in the abdomen pelvis. Musculoskeletal: No aggressive osseous lesion. Posterior lumbar fusion noted IMPRESSION: 1. Dense consolidation within the RIGHT lower lobe and patchy consolidation in the RIGHT upper lobe. Differential includes multi focal lobar pneumonia versus mucous plugging versus a combination of both. Cannot exclude bronchogenic carcinoma but less favored. Patient may benefit from bronchoscopy. 2. RIGHT hemidiaphragm is significantly elevated. 3. Small amount fluid around the gallbladder with gallstones. Findings could represent acute or chronic cholecystitis. 4. Abdominal aortic stent graft in place with good opacification. Electronically Signed   By: Genevive Bi M.D.   On: 03/03/2016 16:51   Ct Cervical Spine Wo Contrast  Result Date: 03/03/2016 CLINICAL DATA:  Found unresponsive at home, unknown duration of time down, fall, hypertension, COPD, former smoker, dilated cardiomyopathy EXAM: CT HEAD WITHOUT CONTRAST CT CERVICAL SPINE WITHOUT CONTRAST TECHNIQUE: Multidetector  CT imaging of the head and cervical spine was performed following the standard protocol without intravenous contrast. Multiplanar CT image reconstructions of the cervical spine were also generated. COMPARISON:  CT head and cervical spine 02/27/2016 FINDINGS: CT HEAD FINDINGS Minimal atrophy. Normal ventricular morphology. No midline shift or mass effect. Small vessel chronic ischemic changes of deep cerebral white matter. Scattered parenchymal calcifications. No  intracranial hemorrhage, mass lesion, or evidence acute infarction. No extra-axial fluid collections. Atherosclerotic calcification of LEFT vertebral and BILATERAL internal carotid arteries at skullbase. Scattered mucosal thickening in the paranasal sinuses with tiny amounts of fluid dependently in the maxillary sinuses. No calvarial fractures. CT CERVICAL SPINE FINDINGS Atherosclerotic calcification of internal carotid and vertebral arteries. Large area of opacity at the RIGHT apex consistent with pneumonia or aspiration since no opacity/mass was identified on prior/recent CT. Endotracheal tube at clavicular heads, above carina. Beam hardening artifacts of dental origin and related to orthopedic hardware at the upper cervical spine. Diffuse osseous demineralization. Prior posterior occiputo-cervical fusion extending from occiput to T-2. Hardware intact. Degenerative disc disease with disc space narrowing and endplate spur formation at C3-C4 and C4-C5. Vertebral body heights maintained without fracture or subluxation. Odontoid appears intact. Prior decompression of the spinal canal with resection of the posterior elements at C1 through C6. IMPRESSION: Mild atrophy with small vessel chronic ischemic changes of deep cerebral white matter. No acute intracranial abnormalities. Prior occipitocervical fusion extending from occipital bone to T-2. Prior posterior spinal decompression of C1 through C6. No acute cervical spine abnormalities. Large area of new opacification at the RIGHT apex question pneumonia versus aspiration. Electronically Signed   By: Ulyses Southward M.D.   On: 03/03/2016 16:50   Ct Abdomen Pelvis W Contrast  Result Date: 03/03/2016 CLINICAL DATA:  Found unresponsive at home. Time down unknown. No one with pt to provide EXAM: CT CHEST, ABDOMEN, AND PELVIS WITH CONTRAST TECHNIQUE: Multidetector CT imaging of the chest, abdomen and pelvis was performed following the standard protocol during bolus  administration of intravenous contrast. CONTRAST:  ISOVUE-300 IOPAMIDOL (ISOVUE-300) INJECTION 61% COMPARISON:  CT 04/16/2015 FINDINGS: CT CHEST FINDINGS Cardiovascular: Coronary artery calcification and aortic atherosclerotic calcification. Mediastinum/Nodes: No axillary or supraclavicular lymphadenopathy. Small a gas in the soft tissues of the thoracic inlet adjacent to strap muscles (image 5, series 2 and related to recent tracheostomy. Tracheostomy tube in good position. No mediastinal lymphadenopathy. No pericardial fluid. Esophagus normal. Lungs/Pleura: Dense consolidation within the RIGHT lower lobe and patchy consolidation RIGHT middle lobe and RIGHT upper lobe. Small effusion extends along the oblique fissure in the RIGHT hemi thorax. The RIGHT hemidiaphragm is elevated with a loop of colon beneath the RIGHT hemidiaphragm. The LEFT lung is relatively clear. The RIGHT lower lobe bronchus is difficult to follow. The bronchus intermedius is patent. The RIGHT middle lobe and RIGHT upper lobe bronchi are patent. Trachea normal. There is a tracheostomy tube in place. Musculoskeletal: No acute finding CT ABDOMEN PELVIS FINDINGS Hepatobiliary: No focal hepatic lesion. Large gallstone within lumen gallbladder without gallbladder distension. There is small amount of pericholecystic fluid. Pancreas: Normal pancreas Spleen: Normal spleen Adrenals/Urinary Tract: Adrenal glands and kidneys are normal. Ureters normal. Foley catheter in the bladder. Stomach/Bowel: Stomach, small bowel, appendix, cecum normal. The colon and rectosigmoid colon are normal. Vascular/Lymphatic: Abdominal aortic stent graft in place and well opacified. Reproductive: Prostate small Other: No adenopathy in the abdomen pelvis. Musculoskeletal: No aggressive osseous lesion. Posterior lumbar fusion noted IMPRESSION: 1. Dense consolidation within the RIGHT lower lobe and  patchy consolidation in the RIGHT upper lobe. Differential includes multi  focal lobar pneumonia versus mucous plugging versus a combination of both. Cannot exclude bronchogenic carcinoma but less favored. Patient may benefit from bronchoscopy. 2. RIGHT hemidiaphragm is significantly elevated. 3. Small amount fluid around the gallbladder with gallstones. Findings could represent acute or chronic cholecystitis. 4. Abdominal aortic stent graft in place with good opacification. Electronically Signed   By: Genevive Bi M.D.   On: 03/03/2016 16:51   Dg Chest Port 1 View  Result Date: 03/04/2016 CLINICAL DATA:  Respiratory failure EXAM: PORTABLE CHEST 1 VIEW COMPARISON:  02/27/2016 FINDINGS: Tracheostomy appliance is in the midline. Stable elevation of the right hemidiaphragm. Airspace consolidation in the bases. No large effusion. No pneumothorax. IMPRESSION: Consolidation in the bases, right greater than left, probably unchanged from the recent CT. Electronically Signed   By: Ellery Plunk M.D.   On: 03/04/2016 03:13   Dg Humerus Left  Result Date: 03/03/2016 CLINICAL DATA:  Found unresponsive at home on floor. Fall. Trauma. Deformity. Initial encounter. EXAM: LEFT HUMERUS - 2+ VIEW COMPARISON:  None. FINDINGS: Degenerative glenohumeral joint space narrowing and marginal spurring are noted. The humerus appears intact without evidence of fracture. No focal osseous lesion or soft tissue abnormality is seen. IMPRESSION: No evidence of acute osseous abnormality. Electronically Signed   By: Sebastian Ache M.D.   On: 03/03/2016 16:27   Dg Humerus Right  Result Date: 03/03/2016 CLINICAL DATA:  Unresponsive, multiple bruises EXAM: RIGHT HUMERUS - 2+ VIEW COMPARISON:  Right shoulder radiographs dated 04/07/2014 FINDINGS: No fracture or dislocation is seen. The joint spaces are preserved. Visualized soft tissues are within normal limits. IMPRESSION: No fracture or dislocation is seen. Electronically Signed   By: Charline Bills M.D.   On: 03/03/2016 16:24   Dg Femur Min 2 Views  Left  Result Date: 03/03/2016 CLINICAL DATA:  Found unresponsive on floor at home, probable fall EXAM: LEFT FEMUR 2 VIEWS COMPARISON:  None. FINDINGS: Views of the left humerus show no acute fracture. Alignment is normal. There is calcification of the superficial femoral artery present. IMPRESSION: No acute fracture. Electronically Signed   By: Dwyane Dee M.D.   On: 03/03/2016 16:33   Dg Femur, Min 2 Views Right  Result Date: 03/03/2016 CLINICAL DATA:  Found unresponsive at home, probable fall EXAM: RIGHT FEMUR 2 VIEWS COMPARISON:  None. FINDINGS: Views of the right femur show no fracture. Alignment is normal. No significant degenerative change is seen. IMPRESSION: Negative. Electronically Signed   By: Dwyane Dee M.D.   On: 03/03/2016 16:32     ASSESSMENT / PLAN:  70 yo white male with h/o COPD admitted to ICU for acute resp failure from hypercapnic resp failure from COPD exacerbation CT chest suggests multilobar pneumonia or RT sided  pulmonary contusion  Exam also shows signs of probable anoxic brain injury-will wean sedation and assess neuro status   PULMONARY-s/p trach -Respiratory Failure -continue Full MV support -continue Bronchodilator Therapy -Wean Fio2 and PEEP as tolerated   CARDIOVASCULAR BP stable, ICU monitoring needed  RENAL Follow chem 7  GASTROINTESTINAL NPO for now  HEMATOLOGIC Follow CBC  INFECTIOUS Pneumonia, will treat  Empiric abx Obtain cultures   ENDOCRINE - ICU hypoglycemic\Hyperglycemia protocol   NEUROLOGIC - intubated  - RASS goal: -1 -avoid sedatives and assess neuro status-wean sedation this AM Will require Complete Neuro work up if patient neuro status does not improve MRI,  EEG    I have personally obtained a history, examined  the patient, evaluated Pertinent laboratory and RadioGraphic/imaging results, and  formulated the assessment and plan   The Patient requires high complexity decision making for assessment and support,  frequent evaluation and titration of therapies, application of advanced monitoring technologies and extensive interpretation of multiple databases. Critical Care Time devoted to patient care services described in this note is 35 minutes.   Overall, patient is critically ill, prognosis is guarded.  Patient with Multiorgan failure and at high risk for cardiac arrest and death.    Lucie LeatherKurian [name] Anglia Blakley, M.D.  [facility]  Medical Director [facility] Medical Director [facility]

## 2016-03-04 NOTE — Progress Notes (Signed)
Called by RN for vent alarms. Pt RR 50-60, Ve alarm on vent is low. Suctioned pt, no secrections noted. Pt is alert and trying to talk. Pt states he is in pain. Expalined to pt that he could not talk around trach. He can continue to write on paper and we can try to read his lips. He expresses his pain again. Informed RN Del of findings and pt issues.

## 2016-03-04 NOTE — Progress Notes (Signed)
eLink Physician-Brief Progress Note Patient Name: Chad LabradorDanny L Huffman DOB: 1945-07-23 MRN: [id number]   Date of Service  03/04/2016  HPI/Events of Note  Camera check on patient with neck pain post tracheostomy. Patient reports pain 5/10. Currently has been ordered as needed and blood pressure is stable but marginal. Ventilator appears to show dyssynchrony and respiratory rate in the 60s. By my visual estimation/count patient's respiratory rate is truly in the 20s to around 30 and most. No signs of respiratory distress. Question whether or not the ventilator may be malfunctioning.   eICU Interventions  1. Continuing current pain medication orders 2. Nurse to contact respiratory therapy to exchange ventilator/trouble shoot for malfunction      Intervention Category Major Interventions: Respiratory failure - evaluation and management;Other:  Chad CousinsJennings Zakariye Huffman 03/04/2016, 9:38 PM

## 2016-03-04 NOTE — Progress Notes (Signed)
Pharmacy Antibiotic Note  Chad Huffman is a 70 y.o. male admitted on 03/03/2016 with pneumonia and respiratory failure.  Pharmacy has been consulted for aztreonam and vancomcyin dosing.  Plan: MRSA PCR is negative. After discussion with Dr. Belia HemanKasa, will d/c vancomycin and add Levaquin.   Levaquin 750 mg iv q 24 hours.   Continue aztreonam 2gm IV every 8 hours.   Pharmacy will continue to follow and adjust as needed.   Height: 5\' 11"  (180.3 cm) Weight: 242 lb 1 oz (109.8 kg) IBW/kg (Calculated) : 75.3  Temp (24hrs), Avg:97.2 F (36.2 C), Min:92.7 F (33.7 C), Max:100.2 F (37.9 C)   Recent Labs Lab 02/27/16 1617 02/28/16 0414 03/03/16 1140 03/04/16 0621  WBC 7.6 7.0 10.6 26.9*  CREATININE 1.06 0.88 1.03 1.40*  LATICACIDVEN  --   --  0.8  --     Estimated Creatinine Clearance: 63.6 mL/min (by C-G formula based on SCr of 1.4 mg/dL (H)).    Allergies  Allergen Reactions  . Ampicillin Swelling and Other (See Comments)    Unable to obtain enough information to answer additional questions about this medication.  Has patient had a PCN reaction causing immediate rash, facial/tongue/throat swelling, SOB or lightheadedness with hypotension: Yes Has patient had a PCN reaction causing severe rash involving mucus membranes or skin necrosis: No Has patient had a PCN reaction that required hospitalization No Has patient had a PCN reaction occurring within the last 10 years: Yes If all of the above answers are "NO", then may pr  . Nortriptyline Other (See Comments)    Sleep walking  . Brimonidine Other (See Comments)    Reaction:  Unknown   . Cyclobenzaprine Other (See Comments)    Reaction: Hallucinations  . Tizanidine Other (See Comments)    Reaction: Hallucinations  . Gabapentin Rash    Antimicrobials this admission: 9/13 Aztreonam  >>  9/13 vancomcyin >> 9/14 9/14 Levaquin >>  Dose adjustments this admission:   Microbiology results: 9/13 BCx: NGTD 9/13 UCx:  negative 9/14 TA: GPC, GNR  MRSA PCR: negative  Thank you for allowing pharmacy to be a part of this patient's care.  Luisa HartScott Fillmore Bynum, PharmD Clinical Pharmacist  03/04/2016 4:29 PM

## 2016-03-04 NOTE — Progress Notes (Signed)
Pt vent was changed out per Dr. Jamison NeighborNestor. Vent settings remained the same. The vent continued to alarm, RR 5-0's, Ve less than 1.-0. RN Del notified and Dr. Jamison NeighborNestor called. Dr. Jamison NeighborNestor gave orders to change pt settings.

## 2016-03-04 NOTE — Progress Notes (Signed)
Initial Nutrition Assessment  DOCUMENTATION CODES:   Obesity unspecified  INTERVENTION:  -If unable to wean from vent and start diet within 48 hours, recommend insertion of NG tube with initiation of TF. Continue to assess   NUTRITION DIAGNOSIS:   Inadequate oral intake related to acute illness as evidenced by NPO status.  GOAL:   Provide needs based on ASPEN/SCCM guidelines  MONITOR:   Labs, Weight trends, Vent status  REASON FOR ASSESSMENT:   Ventilator    ASSESSMENT:    70 yo male admitted after being found unresponsive at home, acute respiratory failure with failed attempts at intubation requiring emergent trach by ENT   Patient is currently on ventilator support via trach MV: 9 L/min Temp (24hrs), Avg:96.1 F (35.6 C), Min:91.9 F (33.3 C), Max:100.2 F (37.9 C)  No NG tube in place at present  Diet Order:  Diet NPO time specified  Skin:  Reviewed, no issues  Last BM:  9/7  Labs: reviewed  Meds: solumedrol, precedex, fentanyl  Height:   Ht Readings from Last 1 Encounters:  03/03/16 5\' 11"  (1.803 m)    Weight:   Wt Readings from Last 1 Encounters:  03/04/16 242 lb 1 oz (109.8 kg)    BMI:  Body mass index is 33.76 kg/m.  Estimated Nutritional Needs:   Kcal:  1210-1540 kcals   Protein:  >/= 156 g  Fluid:  >/= 2.2 L  EDUCATION NEEDS:   No education needs identified at this time  Romelle StarcherCate Melayna Robarts MS, RD, LDN [phone] Pager  [phone] Weekend/On-Call Pager m

## 2016-03-05 ENCOUNTER — Inpatient Hospital Stay: Payer: Medicare Other

## 2016-03-05 DIAGNOSIS — J441 Chronic obstructive pulmonary disease with (acute) exacerbation: Secondary | ICD-10-CM

## 2016-03-05 LAB — BLOOD GAS, ARTERIAL
ACID-BASE EXCESS: 10.1 mmol/L — AB (ref 0.0–2.0)
Bicarbonate: 38 mmol/L — ABNORMAL HIGH (ref 20.0–28.0)
FIO2: 28
O2 Saturation: 97.8 %
PH ART: 7.33 — AB (ref 7.350–7.450)
Patient temperature: 37
pCO2 arterial: 72 mmHg (ref 32.0–48.0)
pO2, Arterial: 107 mmHg (ref 83.0–108.0)

## 2016-03-05 LAB — TRIGLYCERIDES: Triglycerides: 75 mg/dL (ref <150)

## 2016-03-05 MED ORDER — MIDAZOLAM HCL 2 MG/2ML IJ SOLN
INTRAMUSCULAR | Status: AC
  Filled 2016-03-05: qty 2

## 2016-03-05 MED ORDER — MIDAZOLAM HCL 2 MG/2ML IJ SOLN
2.0000 mg | Freq: Once | INTRAMUSCULAR | Status: AC
  Administered 2016-03-05: 2 mg via INTRAVENOUS

## 2016-03-05 MED ORDER — DIAZEPAM 5 MG/ML IJ SOLN
5.0000 mg | Freq: Four times a day (QID) | INTRAMUSCULAR | Status: DC | PRN
  Administered 2016-03-05: 5 mg via INTRAVENOUS
  Filled 2016-03-05: qty 2

## 2016-03-05 MED ORDER — PROPOFOL 1000 MG/100ML IV EMUL
5.0000 ug/kg/min | INTRAVENOUS | Status: DC
  Administered 2016-03-05: 40 ug/kg/min via INTRAVENOUS
  Administered 2016-03-05: 30 ug/kg/min via INTRAVENOUS
  Administered 2016-03-05: 10 ug/kg/min via INTRAVENOUS
  Administered 2016-03-06: 40 ug/kg/min via INTRAVENOUS
  Administered 2016-03-06: 30 ug/kg/min via INTRAVENOUS
  Administered 2016-03-06: 35 ug/kg/min via INTRAVENOUS
  Administered 2016-03-06: 40 ug/kg/min via INTRAVENOUS
  Administered 2016-03-06: 30 ug/kg/min via INTRAVENOUS
  Administered 2016-03-07: 45 ug/kg/min via INTRAVENOUS
  Administered 2016-03-07 – 2016-03-08 (×5): 40 ug/kg/min via INTRAVENOUS
  Administered 2016-03-08: 20 ug/kg/min via INTRAVENOUS
  Administered 2016-03-08: 45 ug/kg/min via INTRAVENOUS
  Administered 2016-03-08: 25 ug/kg/min via INTRAVENOUS
  Administered 2016-03-08: 50 ug/kg/min via INTRAVENOUS
  Administered 2016-03-08: 40 ug/kg/min via INTRAVENOUS
  Administered 2016-03-09: 45 ug/kg/min via INTRAVENOUS
  Administered 2016-03-09: 40 ug/kg/min via INTRAVENOUS
  Administered 2016-03-09 (×3): 45 ug/kg/min via INTRAVENOUS
  Filled 2016-03-05 (×26): qty 100

## 2016-03-05 MED ORDER — MIDAZOLAM HCL 2 MG/2ML IJ SOLN
INTRAMUSCULAR | Status: AC
  Administered 2016-03-05: 2 mg via INTRAVENOUS
  Filled 2016-03-05: qty 2

## 2016-03-05 MED ORDER — LORAZEPAM 2 MG/ML IJ SOLN
2.0000 mg | Freq: Once | INTRAMUSCULAR | Status: AC
  Administered 2016-03-05: 2 mg via INTRAVENOUS
  Filled 2016-03-05: qty 1

## 2016-03-05 NOTE — Progress Notes (Signed)
PULMONARY / CRITICAL CARE MEDICINE   Name: Chad Huffman MRN: [id number] DOB: Jul 14, 1945    ADMISSION DATE:  03/03/2016 CHIEF COMPLAINT:  Acute resp failure   STUDIES:  CT chest -RT sided multifocal opacities c/w pneumonia  SIGNIFICANT EVENTS: 9/13-failed intubation, emergent trach   HISTORY OF PRESENT ILLNESS:  70 yo white male seen for acute resp failure Patient was found unresponsive at home after sister found patient, EMS arrived and noted to have posturing and o2sats in 60's Patient was bought to ER, GCS<8, not protecting airway, increased WOB patient had multiple attempts at intubation and subsequently went to OR for emergent trach by ENT  Patient currently unresponsive, gcs<8T on vent s/p trach  - It is unknown how long he was down but he was discharged from the hospital 3 days ago after an acute respiratory failure episode with hypoxia and hypoxemia to be secondary to his COPD.    -He has extensive sequela of traumatic injury including what appears to be dependent lividity on his extremities, bruising on his lower extremities, dependent edema in his scrotum, swelling around his eyes and chemosis more notable on the left than the right, all consistent with being down for an extended period of time.  No other history is currently available.   9/13 emergent  Trach 9/14 assess neuro status 9/15 alert and awake, on full vent support fio1 100%, PEEP at 10 Needs trach and Vent to survive    REVIEW OF SYSTEMS: unobtainable due to critical illness    VITAL SIGNS: Temp:  [97.3 F (36.3 C)-99.1 F (37.3 C)] 97.4 F (36.3 C) (09/14 1945) Pulse Rate:  [62-110] 77 (09/15 0800) Resp:  [17-25] 25 (09/14 1300) BP: (89-144)/(50-102) 132/73 (09/15 0800) SpO2:  [92 %-100 %] 100 % (09/15 0818) FiO2 (%):  [90 %-100 %] 100 % (09/15 0818) Weight:  [246 lb 11.1 oz (111.9 kg)] 246 lb 11.1 oz (111.9 kg) (09/15 0658) HEMODYNAMICS:   VENTILATOR SETTINGS: Vent Mode: PSV FiO2 (%):   [90 %-100 %] 100 % Set Rate:  [15 bmp] 15 bmp Vt Set:  [500 mL] 500 mL PEEP:  [10 cmH20] 10 cmH20 Pressure Support:  [10 cmH20-16 cmH20] 16 cmH20 INTAKE / OUTPUT:  Intake/Output Summary (Last 24 hours) at 03/05/16 0830 Last data filed at 03/04/16 1723  Gross per 24 hour  Intake           663.44 ml  Output              245 ml  Net           418.44 ml    PHYSICAL EXAMINATION: PHYSICAL EXAMINATION: Physical Examination:   GENERAL:critically ill appearing, s/p trach HEAD: Normocephalic, atraumatic.  EYES: Pupils equal, round, reactive to light.  No scleral icterus.  MOUTH: Moist mucosal membrane. NECK: Supple. No thyromegaly. No nodules. No JVD. c collar in place s/p trach PULMONARY: Diffuse coarse rhonchi right sided +wheezes +rhonchi CARDIOVASCULAR: S1 and S2. Regular rate and rhythm. No murmurs, rubs, or gallops.  GASTROINTESTINAL: Soft, nontender, +distended. No masses. Positive bowel sounds. No hepatosplenomegaly.  MUSCULOSKELETAL: No swelling, clubbing, or edema.  NEUROLOGIC: GCS<8T SKIN:intact,warm,dry eccymosis, scrotal edema    ABX  9/13 Vancomycin/Aztreonam--> Cultures pending  CBC  Recent Labs Lab 02/28/16 0414 03/03/16 1140 03/04/16 0621  WBC 7.0 10.6 26.9*  HGB 9.8* 9.0* 9.7*  HCT 29.0* 27.4* 30.4*  PLT 137* 129* 160   Coag's No results for input(s): APTT, INR in the last 168 hours. BMET  Recent Labs  Lab 02/28/16 0414 03/03/16 1140 03/04/16 0621  NA 135 136 135  K 4.0 3.3* 4.8  CL 95* 106 102  CO2 34* 26 27  BUN 17 42* 43*  CREATININE 0.88 1.03 1.40*  GLUCOSE 90 101* 115*   Electrolytes  Recent Labs Lab 02/28/16 0414 03/03/16 1140 03/04/16 0621  CALCIUM 8.6* 6.3* 7.6*   Sepsis Markers  Recent Labs Lab 03/03/16 1140  LATICACIDVEN 0.8   ABG  Recent Labs Lab 02/28/16 0000 03/03/16 1443 03/04/16 0444  PHART 7.41 7.29* 7.30*  PCO2ART 59* 63* 58*  PO2ART 76* 51* 73*   Liver Enzymes  Recent Labs Lab 03/03/16 1140  AST  25  ALT 14*  ALKPHOS 42  BILITOT 0.9  ALBUMIN 2.7*   Cardiac Enzymes  Recent Labs Lab 02/27/16 1617  TROPONINI <0.03   Glucose  Recent Labs Lab 02/27/16 2247 03/03/16 1148 03/03/16 2101  GLUCAP 105* 119* 137*    Imaging No results found.   ASSESSMENT / PLAN:  70 yo white male with h/o COPD admitted to ICU for acute resp failure from hypercapnic resp failure from COPD exacerbation CT chest suggests multilobar pneumonia or RT sided  pulmonary contusion    PULMONARY-s/p trach -Respiratory Failure -continue Full MV support high PS setttings -continue Bronchodilator Therapy -Wean Fio2 and PEEP as tolerated   CARDIOVASCULAR BP stable, ICU monitoring needed  RENAL Follow chem 7  GASTROINTESTINAL NPO for now  HEMATOLOGIC Follow CBC  INFECTIOUS Pneumonia, will treat  Empiric abx Obtain cultures   ENDOCRINE - ICU hypoglycemic\Hyperglycemia protocol   NEUROLOGIC precedex for agitation   Assess for LTACH   I have personally obtained a history, examined the patient, evaluated Pertinent laboratory and RadioGraphic/imaging results, and  formulated the assessment and plan   The Patient requires high complexity decision making for assessment and support, frequent evaluation and titration of therapies, application of advanced monitoring technologies and extensive interpretation of multiple databases. Critical Care Time devoted to patient care services described in this note is 35 minutes.   Overall, patient is critically ill, prognosis is guarded.    Lucie Leather, M.D.  [facility]  Medical Director [facility] Medical Director [facility] Cardio-Pulmonary Department

## 2016-03-05 NOTE — Progress Notes (Signed)
During rounds spoke to Dr Belia HemanKasa in regards to patients agitation and confusion. Orders to increase precedex to maintain a rass of 0. He will  Also enter orders for valium.

## 2016-03-05 NOTE — Progress Notes (Signed)
Spoke with Dr Azzie RoupKasai regarding patients vent alarming low volumes. Patient has been agitated and confused. At this point orders to increase precedex.

## 2016-03-05 NOTE — Progress Notes (Addendum)
Spoke with Dr. Belia HemanKasa regarding patient. Times of confusion and agitation. Orders to start propofol and discontinue precedex. It appears that patient is blowing pressured air from mouth. Dr Belia HemanKasa is consulting ENT for evaluation.

## 2016-03-05 NOTE — Progress Notes (Signed)
Pharmacy Antibiotic Note  Chad Huffman is a 70 y.o. male admitted on 03/03/2016 with pneumonia and respiratory failure.  Pharmacy has been consulted for aztreonam and levofloxacin dosing.  Plan: MRSA PCR is negative.   Levaquin 750 mg iv q 24 hours.   Continue aztreonam 2gm IV every 8 hours.   Pharmacy will continue to follow and adjust as needed.   Height: 5\' 11"  (180.3 cm) Weight: 246 lb 11.1 oz (111.9 kg) IBW/kg (Calculated) : 75.3  Temp (24hrs), Avg:97.6 F (36.4 C), Min:97.3 F (36.3 C), Max:98 F (36.7 C)   Recent Labs Lab 02/27/16 1617 02/28/16 0414 03/03/16 1140 03/04/16 0621  WBC 7.6 7.0 10.6 26.9*  CREATININE 1.06 0.88 1.03 1.40*  LATICACIDVEN  --   --  0.8  --     Estimated Creatinine Clearance: 64.2 mL/min (by C-G formula based on SCr of 1.4 mg/dL (H)).    Allergies  Allergen Reactions  . Ampicillin Swelling and Other (See Comments)    Unable to obtain enough information to answer additional questions about this medication.  Has patient had a PCN reaction causing immediate rash, facial/tongue/throat swelling, SOB or lightheadedness with hypotension: Yes Has patient had a PCN reaction causing severe rash involving mucus membranes or skin necrosis: No Has patient had a PCN reaction that required hospitalization No Has patient had a PCN reaction occurring within the last 10 years: Yes If all of the above answers are "NO", then may pr  . Nortriptyline Other (See Comments)    Sleep walking  . Brimonidine Other (See Comments)    Reaction:  Unknown   . Cyclobenzaprine Other (See Comments)    Reaction: Hallucinations  . Tizanidine Other (See Comments)    Reaction: Hallucinations  . Gabapentin Rash    Antimicrobials this admission: 9/13 Aztreonam  >>  9/13 vancomcyin >> 9/14 9/14 Levaquin >>  Dose adjustments this admission:   Microbiology results: 9/13 BCx: NGTD 9/13 UCx: negative 9/14 TA: GPC, GNR  MRSA PCR: negative  Thank you for allowing  pharmacy to be a part of this patient's care.  Luisa HartScott Christy, PharmD Clinical Pharmacist  03/05/2016 12:31 PM

## 2016-03-05 NOTE — Procedures (Signed)
03/05/2016  8:29 PM    Chad Huffman  [id number]   Pre-Op Diagnosis:  Respiratory Failure  Post-op Diagnosis: same  Procedure:   Bronchoscopy with exchange of endotracheal tube Surgeon:  Sandi MealyBennett, Chad Huffman  Anesthesia:  General endotracheal  EBL:  n/a  Complications:  None  Findings: #8 Shiley tracheostomy tube exchanged for #8 endotracheal tube  Indications: Patient status post trach 2 days ago with significant cuff leak  Procedure: Patient is sedated on the vent. Assessed the current tracheostomy tube and cuff seems to be holding air, but respiratory therapy has struggled with intermittent leaking of air, making ventilation more difficult. A flexible bronchoscope was passed through the tracheostomy tube to assess position and the tracheostomy tube appears in good position with the tracheal lumen visualized at the end of the tube. After discussing with Dr. Belia HemanKasa, it was felt there best option would be to remove the tracheostomy tube and replace with an endotracheal tube, which might provide better seal. Possible the standard trach tube was slightly short and positional. Given the fact the trach wound is fresh, it was felt best to exchange the tubes over a bougie. With the patient well ventilated, a bougie was passed through the tracheostomy tube into the trachea. The tracheostomy tube was then removed over the bougie, leaving the bougie in position. A #8 cuffed endotracheal tube was then placed over the bougie and passed into the trachea. The balloon was inflated and patient was ventilated and proper ventilation confirmed. Bronchoscopy was repeated and good positioning in the tracheal lumen, just above the carina, was confirmed. Secretions were suctioned during the procedure to clear the airway. The tube was then secured with a trach tie around the neck.  The patient was then returned to the care of the CCU staff. Dr. Belia HemanKasa was to obtain a CXR to further confirm positioning.   Plan: Will  leave the endotracheal tube for at least a week, then place a standard tracheostomy tube once the tract is well healed.  Sandi MealyBennett, Chad Huffman 03/05/2016 8:29 PM

## 2016-03-05 NOTE — Care Management (Signed)
[facility] I have provided choice of [facility] or [facility] to patient's brother [name] [phone] as patient is not able to make decisions at this time per staff. He will review both places and call this RNCM back with decision. [facility]die BraunKaren [facility] has accepted [facility] if brother decides on [facility]. [facility]. RN and unit clerk updated.

## 2016-03-05 NOTE — Progress Notes (Signed)
Spoke with Dr. Belia HemanKasa regarding the need to increase the propofol due to patients agitation and vent alarming low pressures. Per Dr Belia HemanKasa ENT should be here a little after 4:00pm for consult.

## 2016-03-06 ENCOUNTER — Inpatient Hospital Stay: Payer: Medicare Other

## 2016-03-06 DIAGNOSIS — J9602 Acute respiratory failure with hypercapnia: Secondary | ICD-10-CM

## 2016-03-06 LAB — CBC
HEMATOCRIT: 25.3 % — AB (ref 40.0–52.0)
Hemoglobin: 8.3 g/dL — ABNORMAL LOW (ref 13.0–18.0)
MCH: 29 pg (ref 26.0–34.0)
MCHC: 32.7 g/dL (ref 32.0–36.0)
MCV: 88.8 fL (ref 80.0–100.0)
PLATELETS: 145 10*3/uL — AB (ref 150–440)
RBC: 2.85 MIL/uL — ABNORMAL LOW (ref 4.40–5.90)
RDW: 18.5 % — AB (ref 11.5–14.5)
WBC: 15.7 10*3/uL — ABNORMAL HIGH (ref 3.8–10.6)

## 2016-03-06 LAB — PHOSPHORUS: PHOSPHORUS: 4.3 mg/dL (ref 2.5–4.6)

## 2016-03-06 LAB — CULTURE, RESPIRATORY W GRAM STAIN

## 2016-03-06 LAB — BASIC METABOLIC PANEL
Anion gap: 5 (ref 5–15)
BUN: 50 mg/dL — ABNORMAL HIGH (ref 6–20)
CALCIUM: 8.7 mg/dL — AB (ref 8.9–10.3)
CO2: 27 mmol/L (ref 22–32)
CREATININE: 1.3 mg/dL — AB (ref 0.61–1.24)
Chloride: 107 mmol/L (ref 101–111)
GFR, EST NON AFRICAN AMERICAN: 55 mL/min — AB (ref >60)
Glucose, Bld: 107 mg/dL — ABNORMAL HIGH (ref 65–99)
Potassium: 5.1 mmol/L (ref 3.5–5.1)
Sodium: 139 mmol/L (ref 135–145)

## 2016-03-06 LAB — CULTURE, RESPIRATORY: CULTURE: NORMAL

## 2016-03-06 LAB — TRIGLYCERIDES: TRIGLYCERIDES: 159 mg/dL — AB (ref <150)

## 2016-03-06 LAB — MAGNESIUM: Magnesium: 2.7 mg/dL — ABNORMAL HIGH (ref 1.7–2.4)

## 2016-03-06 MED ORDER — SODIUM CHLORIDE 0.9% FLUSH: 10.0000 mL | INTRAVENOUS | Status: DC | PRN

## 2016-03-06 MED ORDER — MIDAZOLAM HCL 2 MG/2ML IJ SOLN
2.0000 mg | INTRAMUSCULAR | Status: DC | PRN
  Administered 2016-03-06 – 2016-03-09 (×4): 2 mg via INTRAVENOUS
  Administered 2016-03-09: 4 mg via INTRAVENOUS
  Filled 2016-03-06: qty 4
  Filled 2016-03-06 (×4): qty 2

## 2016-03-06 MED ORDER — ORAL CARE MOUTH RINSE
15.0000 mL | OROMUCOSAL | Status: DC
  Administered 2016-03-06 – 2016-03-09 (×27): 15 mL via OROMUCOSAL

## 2016-03-06 MED ORDER — SODIUM CHLORIDE 0.9% FLUSH
10.0000 mL | Freq: Two times a day (BID) | INTRAVENOUS | Status: DC
  Administered 2016-03-06 – 2016-03-07 (×2): 10 mL
  Administered 2016-03-08: 20 mL
  Administered 2016-03-08: 10 mL
  Administered 2016-03-09: 20 mL

## 2016-03-06 NOTE — Care Management (Signed)
Patient brother [name] has not called this RNCM back. He spoke with RN and stated that he wants to visit LTAC and will let us know his decision on LTAC preference by Monday or Tuesday.

## 2016-03-06 NOTE — Progress Notes (Signed)
Pharmacy Antibiotic Note  Chad LabradorDanny L Huffman is a 70 y.o. male admitted on 03/03/2016 with pneumonia and respiratory failure.  Pharmacy has been consulted for aztreonam and levofloxacin dosing.  Plan: MRSA PCR is negative.   Levaquin 750 mg iv q 24 hours.   Continue aztreonam 2gm IV every 8 hours.   Pharmacy will continue to follow and adjust as needed.   Height: 5\' 11"  (180.3 cm) Weight: 246 lb 11.1 oz (111.9 kg) IBW/kg (Calculated) : 75.3  Temp (24hrs), Avg:97.6 F (36.4 C), Min:97.5 F (36.4 C), Max:97.6 F (36.4 C)   Recent Labs Lab 03/03/16 1140 03/04/16 0621 03/06/16 0402  WBC 10.6 26.9* 15.7*  CREATININE 1.03 1.40* 1.30*  LATICACIDVEN 0.8  --   --     Estimated Creatinine Clearance: 69.2 mL/min (by C-G formula based on SCr of 1.3 mg/dL (H)).    Allergies  Allergen Reactions  . Ampicillin Swelling and Other (See Comments)    Unable to obtain enough information to answer additional questions about this medication.  Has patient had a PCN reaction causing immediate rash, facial/tongue/throat swelling, SOB or lightheadedness with hypotension: Yes Has patient had a PCN reaction causing severe rash involving mucus membranes or skin necrosis: No Has patient had a PCN reaction that required hospitalization No Has patient had a PCN reaction occurring within the last 10 years: Yes If all of the above answers are "NO", then may pr  . Nortriptyline Other (See Comments)    Sleep walking  . Brimonidine Other (See Comments)    Reaction:  Unknown   . Cyclobenzaprine Other (See Comments)    Reaction: Hallucinations  . Tizanidine Other (See Comments)    Reaction: Hallucinations  . Gabapentin Rash    Antimicrobials this admission: 9/13 Aztreonam  >>  9/13 vancomcyin >> 9/14 9/14 Levaquin >>  Dose adjustments this admission:   Microbiology results: 9/13 BCx: NGTD 9/13 UCx: negative 9/14 TA: GPC, GNR  MRSA PCR: negative  Thank you for allowing pharmacy to be a part of  this patient's care.  Demetrius Charityeldrin D. Jayziah Bankhead, PharmD  Clinical Pharmacist  03/06/2016 10:42 AM

## 2016-03-06 NOTE — Progress Notes (Signed)
Per Dr. Belia HemanKasa no wake up assessment this am.

## 2016-03-06 NOTE — Progress Notes (Signed)
Patient rested throughout the day, vitals have been WNL. ET tube 16 cm at neck with moderate to copious secretions. Per Doctor Kasa no sedation vacation. Brother states that he wants to visit LTAC facilities and get back with us for a decision.

## 2016-03-06 NOTE — Progress Notes (Signed)
Attempted to contact Pt's brother to obtain consent for [facility]VC.  Call went directly to voice mail.  Left message on voicemail.

## 2016-03-06 NOTE — Procedures (Signed)
Central Venous Catheter Insertion Procedure Note Chad Huffman [id number] [birth year]  Procedure: Insertion of Left femoral Central Venous Catheter Indications: Assessment of intravascular volume, Drug and/or fluid administration and Frequent blood sampling  Procedure Details Consent: Unable to obtain consent because of medical necessity and family did not answer call. Time Out: Verified patient identification, verified procedure, site/side was marked, verified correct patient position, special equipment/implants available, medications/allergies/relevent history reviewed, required imaging and test results available.  Performed  Maximum sterile technique was used including antiseptics, cap, gloves, gown, hand hygiene, mask and sheet. Skin prep: Chlorhexidine; local anesthetic administered A antimicrobial bonded/coated triple lumen catheter was placed in the left femoral vein due to complicated tracheostomy/fragile airway. The Seldinger technique was utilized with real-time ultrasound guidance.  Evaluation Blood flow good Complications: No apparent complications Patient did tolerate procedure well. Chest X-ray ordered to verify placement.  CXR: not indicated d/t femoral location.  Procedure performed under direct supervision of Dr. Belia HemanKasa. Ultrasound utilized for realtime vessel cannulation  Argus Caraher S. [facility] PLLCukov ANP-BC Pulmonary and Critical Care Medicine [name] Pager [phone] or [phone]  03/06/2016, 12:29 AM

## 2016-03-06 NOTE — Progress Notes (Signed)
PULMONARY / CRITICAL CARE MEDICINE   Name: Chad Huffman MRN: [id number] DOB: Feb 09, 1946    ADMISSION DATE:  03/03/2016 CHIEF COMPLAINT:  Acute resp failure   STUDIES:  CT chest -RT sided multifocal opacities c/w pneumonia   HISTORY OF PRESENT ILLNESS:  70 yo white male seen for acute resp failure Patient was found unresponsive at home after sister found patient, EMS arrived and noted to have posturing and o2sats in 60's Patient was bought to ER, GCS<8, not protecting airway, increased WOB patient had multiple attempts at intubation and subsequently went to OR for emergent trach by ENT  Patient currently unresponsive, gcs<8T on vent s/p trach  - It is unknown how long he was down but he was discharged from the hospital 3 days ago after an acute respiratory failure episode with hypoxia and hypoxemia to be secondary to his COPD.    -He has extensive sequela of traumatic injury including what appears to be dependent lividity on his extremities, bruising on his lower extremities, dependent edema in his scrotum, swelling around his eyes and chemosis more notable on the left than the right, all consistent with being down for an extended period of time.  No other history is currently available.    SIGNIFICANT EVENTS: 9/13-failed intubation, emergent trach 9/15 alert and awake, on full vent support fio1 100%, PEEP at 10 Needs trach and Vent to survivetrach changed out to #8ETT into trachea due to cuff leak 9/16 remains on full vent support, fio2 at 75%, PEEP 10    REVIEW OF SYSTEMS: unobtainable due to critical illness    VITAL SIGNS: Temp:  [97.5 F (36.4 C)-97.6 F (36.4 C)] 97.5 F (36.4 C) (09/15 1945) Pulse Rate:  [76-100] 93 (09/16 0700) BP: (105-161)/(51-98) 118/55 (09/16 0700) SpO2:  [94 %-100 %] 96 % (09/16 0700) FiO2 (%):  [65 %-90 %] 65 % (09/16 0831) HEMODYNAMICS:   VENTILATOR SETTINGS: Vent Mode: PSV;CPAP FiO2 (%):  [65 %-90 %] 65 % PEEP:  [10 cmH20] 10  cmH20 Pressure Support:  [6 cmH20-16 cmH20] 6 cmH20 Plateau Pressure:  [16 cmH20] 16 cmH20 INTAKE / OUTPUT:  Intake/Output Summary (Last 24 hours) at 03/06/16 0855 Last data filed at 03/06/16 0029  Gross per 24 hour  Intake               50 ml  Output             1500 ml  Net            -1450 ml    PHYSICAL EXAMINATION: PHYSICAL EXAMINATION: Physical Examination:   GENERAL:critically ill appearing, s/p trach HEAD: Normocephalic, atraumatic.  EYES: Pupils equal, round, reactive to light.  No scleral icterus.  MOUTH: Moist mucosal membrane. NECK: Supple. No thyromegaly. No nodules. No JVD. c collar in place s/p trach PULMONARY: Diffuse coarse rhonchi right sided +wheezes +rhonchi CARDIOVASCULAR: S1 and S2. Regular rate and rhythm. No murmurs, rubs, or gallops.  GASTROINTESTINAL: Soft, nontender, +distended. No masses. Positive bowel sounds. No hepatosplenomegaly.  MUSCULOSKELETAL: No swelling, clubbing, or edema.  NEUROLOGIC: GCS<8T SKIN:intact,warm,dry eccymosis, scrotal edema    ABX  9/13 Vancomycin/Aztreonam--> Cultures pending  CBC  Recent Labs Lab 03/03/16 1140 03/04/16 0621 03/06/16 0402  WBC 10.6 26.9* 15.7*  HGB 9.0* 9.7* 8.3*  HCT 27.4* 30.4* 25.3*  PLT 129* 160 145*   Coag's No results for input(s): APTT, INR in the last 168 hours. BMET  Recent Labs Lab 03/03/16 1140 03/04/16 0621 03/06/16 0402  NA 136 135 139  K 3.3* 4.8 5.1  CL 106 102 107  CO2 26 27 27   BUN 42* 43* 50*  CREATININE 1.03 1.40* 1.30*  GLUCOSE 101* 115* 107*   Electrolytes  Recent Labs Lab 03/03/16 1140 03/04/16 0621 03/06/16 0402  CALCIUM 6.3* 7.6* 8.7*  MG  --   --  2.7*  PHOS  --   --  4.3   Sepsis Markers  Recent Labs Lab 03/03/16 1140  LATICACIDVEN 0.8   ABG  Recent Labs Lab 03/03/16 1443 03/04/16 0444  PHART 7.29* 7.30*  PCO2ART 63* 58*  PO2ART 51* 73*   Liver Enzymes  Recent Labs Lab 03/03/16 1140  AST 25  ALT 14*  ALKPHOS 42  BILITOT  0.9  ALBUMIN 2.7*   Cardiac Enzymes No results for input(s): TROPONINI, PROBNP in the last 168 hours. Glucose  Recent Labs Lab 03/03/16 1148 03/03/16 2101  GLUCAP 119* 137*    Imaging Dg Chest Port 1 View  Result Date: 03/05/2016 CLINICAL DATA:  Difficult intubation. EXAM: PORTABLE CHEST 1 VIEW COMPARISON:  03/04/2016 FINDINGS: A tracheostomy catheter is present with tip 2 cm above the carina. Increasing pulmonary vascular congestion/ edema on the right noted. There is no evidence of pneumothorax. Right lower lung consolidation/atelectasis again identified. IMPRESSION: Tracheostomy catheter identified with tip 2 cm above the carina. Increasing right pulmonary vascular congestion/ edema. Continued right lower lung opacity/ consolidation/atelectasis. Electronically Signed   By: Harmon PierJeffrey  Hu M.D.   On: 03/05/2016 17:37     ASSESSMENT / PLAN:  70 yo white male with h/o COPD admitted to ICU for acute resp failure from hypercapnic resp failure from COPD exacerbation Patient with difficult airway and intubation emergent trach- CT chest suggests multilobar pneumonia    PULMONARY-s/p trach-changed to ETT though stoma by ENT, will need trach replacement in 7 days -Respiratory Failure -continue Full MV support high PS setttings -continue Bronchodilator Therapy -Wean Fio2 and PEEP as tolerated   CARDIOVASCULAR BP stable, ICU monitoring needed  RENAL Follow chem 7  GASTROINTESTINAL NPO for now  HEMATOLOGIC Follow CBC  INFECTIOUS Pneumonia, will treat  Empiric abx Obtain cultures   ENDOCRINE - ICU hypoglycemic\Hyperglycemia protocol   NEUROLOGIC - intubated and sedated - minimal sedation to achieve a RASS goal: -1     I have personally obtained a history, examined the patient, evaluated Pertinent laboratory and RadioGraphic/imaging results, and  formulated the assessment and plan   The Patient requires high complexity decision making for assessment and support,  frequent evaluation and titration of therapies, application of advanced monitoring technologies and extensive interpretation of multiple databases. Critical Care Time devoted to patient care services described in this note is 35 minutes.   Overall, patient is critically ill, prognosis is guarded.    Lucie LeatherKurian [name] Kriti Katayama, M.D.  [facility]  Medical Director [facility] Medical Director [facility]

## 2016-03-07 ENCOUNTER — Inpatient Hospital Stay: Payer: Medicare Other

## 2016-03-07 LAB — BLOOD GAS, ARTERIAL
ACID-BASE EXCESS: 1.4 mmol/L (ref 0.0–2.0)
Bicarbonate: 29.2 mmol/L — ABNORMAL HIGH (ref 20.0–28.0)
FIO2: 0.6
MECHVT: 500 mL
Mechanical Rate: 15
O2 SAT: 96.9 %
PATIENT TEMPERATURE: 37
PCO2 ART: 65 mmHg — AB (ref 32.0–48.0)
PEEP/CPAP: 5 cmH2O
PH ART: 7.26 — AB (ref 7.350–7.450)
PO2 ART: 102 mmHg (ref 83.0–108.0)

## 2016-03-07 LAB — CBC
HEMATOCRIT: 29.4 % — AB (ref 40.0–52.0)
HEMATOCRIT: 24.3 % — AB (ref 40.0–52.0)
Hemoglobin: 7.9 g/dL — ABNORMAL LOW (ref 13.0–18.0)
Hemoglobin: 9.6 g/dL — ABNORMAL LOW (ref 13.0–18.0)
MCH: 29.7 pg (ref 26.0–34.0)
MCH: 29.4 pg (ref 26.0–34.0)
MCHC: 32.6 g/dL (ref 32.0–36.0)
MCHC: 32.5 g/dL (ref 32.0–36.0)
MCV: 91.1 fL (ref 80.0–100.0)
MCV: 90.2 fL (ref 80.0–100.0)
PLATELETS: 157 10*3/uL (ref 150–440)
Platelets: 125 10*3/uL — ABNORMAL LOW (ref 150–440)
RBC: 2.69 MIL/uL — ABNORMAL LOW (ref 4.40–5.90)
RBC: 3.22 MIL/uL — AB (ref 4.40–5.90)
RDW: 19.4 % — ABNORMAL HIGH (ref 11.5–14.5)
RDW: 19.1 % — AB (ref 11.5–14.5)
WBC: 6.8 10*3/uL (ref 3.8–10.6)
WBC: 9.6 10*3/uL (ref 3.8–10.6)

## 2016-03-07 LAB — APTT: aPTT: 31 seconds (ref 24–36)

## 2016-03-07 LAB — BASIC METABOLIC PANEL
ANION GAP: 9 (ref 5–15)
Anion gap: 3 — ABNORMAL LOW (ref 5–15)
BUN: 66 mg/dL — AB (ref 6–20)
BUN: 67 mg/dL — AB (ref 6–20)
CALCIUM: 8.7 mg/dL — AB (ref 8.9–10.3)
CO2: 25 mmol/L (ref 22–32)
CO2: 29 mmol/L (ref 22–32)
CREATININE: 1.31 mg/dL — AB (ref 0.61–1.24)
Calcium: 9.2 mg/dL (ref 8.9–10.3)
Chloride: 106 mmol/L (ref 101–111)
Chloride: 108 mmol/L (ref 101–111)
Creatinine, Ser: 1.53 mg/dL — ABNORMAL HIGH (ref 0.61–1.24)
GFR calc Af Amer: 52 mL/min — ABNORMAL LOW (ref >60)
GFR calc non Af Amer: 45 mL/min — ABNORMAL LOW (ref >60)
GFR calc non Af Amer: 54 mL/min — ABNORMAL LOW (ref >60)
GLUCOSE: 106 mg/dL — AB (ref 65–99)
GLUCOSE: 106 mg/dL — AB (ref 65–99)
POTASSIUM: 5.6 mmol/L — AB (ref 3.5–5.1)
Potassium: 5.5 mmol/L — ABNORMAL HIGH (ref 3.5–5.1)
Sodium: 140 mmol/L (ref 135–145)
Sodium: 140 mmol/L (ref 135–145)

## 2016-03-07 LAB — PHOSPHORUS
Phosphorus: 5.2 mg/dL — ABNORMAL HIGH (ref 2.5–4.6)
Phosphorus: 6.4 mg/dL — ABNORMAL HIGH (ref 2.5–4.6)

## 2016-03-07 LAB — MAGNESIUM
Magnesium: 3 mg/dL — ABNORMAL HIGH (ref 1.7–2.4)
Magnesium: 3 mg/dL — ABNORMAL HIGH (ref 1.7–2.4)

## 2016-03-07 NOTE — Progress Notes (Signed)
Pharmacy Antibiotic Note  Chad Huffman is a 70 y.o. Chad Huffman admitted on 03/03/2016 with pneumonia and respiratory failure.  Pharmacy has been consulted for aztreonam and levofloxacin dosing.  Plan: MRSA PCR is negative.   Levaquin 750 mg iv q 24 hours.   Continue aztreonam 2gm IV every 8 hours.   Pharmacy will continue to follow and adjust as needed.   Height: 5\' 11"  (180.3 cm) Weight: 242 lb 1 oz (109.8 kg) IBW/kg (Calculated) : 75.3  Temp (24hrs), Avg:98.6 F (37 C), Min:98 F (36.7 C), Max:99 F (37.2 C)   Recent Labs Lab 03/03/16 1140 03/04/16 0621 03/06/16 0402 03/07/16 0415  WBC 10.6 26.9* 15.7* 9.6  CREATININE 1.03 1.40* 1.30* 1.53*  LATICACIDVEN 0.8  --   --   --     Estimated Creatinine Clearance: 58.2 mL/min (by C-G formula based on SCr of 1.53 mg/dL (H)).    Allergies  Allergen Reactions  . Ampicillin Swelling and Other (See Comments)    Unable to obtain enough information to answer additional questions about this medication.  Has patient had a PCN reaction causing immediate rash, facial/tongue/throat swelling, SOB or lightheadedness with hypotension: Yes Has patient had a PCN reaction causing severe rash involving mucus membranes or skin necrosis: No Has patient had a PCN reaction that required hospitalization No Has patient had a PCN reaction occurring within the last 10 years: Yes If all of the above answers are "NO", then may pr  . Nortriptyline Other (See Comments)    Sleep walking  . Brimonidine Other (See Comments)    Reaction:  Unknown   . Cyclobenzaprine Other (See Comments)    Reaction: Hallucinations  . Tizanidine Other (See Comments)    Reaction: Hallucinations  . Gabapentin Rash    Antimicrobials this admission: 9/13 Aztreonam  >>  9/13 vancomcyin >> 9/14 9/14 Levaquin >>  Dose adjustments this admission:   Microbiology results: 9/13 BCx: NGTD 9/13 UCx: negative 9/14 TA: GPC, GNR  MRSA PCR: negative  Thank you for allowing  pharmacy to be a part of this patient's care.  Chad Huffman, PharmD  Clinical Pharmacist  03/07/2016 9:49 AM

## 2016-03-07 NOTE — Progress Notes (Signed)
PULMONARY / CRITICAL CARE MEDICINE   Name: Chad Huffman MRN: [id number] DOB: 28-May-1946    ADMISSION DATE:  03/03/2016 CHIEF COMPLAINT:  Acute resp failure   STUDIES:  CT chest -RT sided multifocal opacities c/w pneumonia   HISTORY OF PRESENT ILLNESS:  70 yo white male seen for acute resp failure Patient was found unresponsive at home after sister found patient, EMS arrived and noted to have posturing and o2sats in 60's Patient was bought to ER, GCS<8, not protecting airway, increased WOB patient had multiple attempts at intubation and subsequently went to OR for emergent trach by ENT  Patient currently unresponsive, gcs<8T on vent s/p trach  - It is unknown how long he was down but he was discharged from the hospital 3 days ago after an acute respiratory failure episode with hypoxia and hypoxemia to be secondary to his COPD.    -He has extensive sequela of traumatic injury including what appears to be dependent lividity on his extremities, bruising on his lower extremities, dependent edema in his scrotum, swelling around his eyes and chemosis more notable on the left than the right, all consistent with being down for an extended period of time.  No other history is currently available.    SIGNIFICANT EVENTS: 9/13-failed intubation, emergent trach 9/15 alert and awake, on full vent support fio1 100%, PEEP at 10 Needs trach and Vent to survivetrach changed out to #8ETT into trachea due to cuff leak 9/16 remains on full vent support, fio2 at 75%, PEEP 10 9/17 remains on full  Vent support fio2 at 60% PEEP 5, CXR shows ETT in place    REVIEW OF SYSTEMS: unobtainable due to critical illness    VITAL SIGNS: Temp:  [98 F (36.7 C)-99 F (37.2 C)] 98 F (36.7 C) (09/17 0800) Pulse Rate:  [76-119] 76 (09/17 0800) Resp:  [14-22] 16 (09/17 0800) BP: (97-139)/(47-65) 100/52 (09/17 0800) SpO2:  [92 %-100 %] 99 % (09/17 0800) FiO2 (%):  [45 %-65 %] 60 % (09/17 0800) Weight:   [242 lb 1 oz (109.8 kg)] 242 lb 1 oz (109.8 kg) (09/17 0500) HEMODYNAMICS:   VENTILATOR SETTINGS: Vent Mode: PRVC FiO2 (%):  [45 %-65 %] 60 % Set Rate:  [15 bmp] 15 bmp Vt Set:  [500 mL] 500 mL PEEP:  [5 cmH20-10 cmH20] 5 cmH20 Pressure Support:  [6 cmH20] 6 cmH20 Plateau Pressure:  [15 cmH20-16 cmH20] 15 cmH20 INTAKE / OUTPUT:  Intake/Output Summary (Last 24 hours) at 03/07/16 0829 Last data filed at 03/07/16 0800  Gross per 24 hour  Intake          1614.83 ml  Output             1815 ml  Net          -200.17 ml    PHYSICAL EXAMINATION: PHYSICAL EXAMINATION: Physical Examination:   GENERAL:critically ill appearing, s/p trach HEAD: Normocephalic, atraumatic.  EYES: Pupils equal, round, reactive to light.  No scleral icterus.  MOUTH: Moist mucosal membrane. NECK: Supple. No thyromegaly. No nodules. No JVD. c collar in place s/p trach PULMONARY: Diffuse coarse rhonchi right sided +wheezes +rhonchi CARDIOVASCULAR: S1 and S2. Regular rate and rhythm. No murmurs, rubs, or gallops.  GASTROINTESTINAL: Soft, nontender, +distended. No masses. Positive bowel sounds. No hepatosplenomegaly.  MUSCULOSKELETAL: No swelling, clubbing, or edema.  NEUROLOGIC: GCS<8T SKIN:intact,warm,dry eccymosis, scrotal edema    ABX  9/13 Vancomycin/Aztreonam--> Cultures pending-normal resp flora  CBC  Recent Labs Lab 03/04/16 4098 03/06/16 0402 03/07/16 0415  WBC 26.9* 15.7* 9.6  HGB 9.7* 8.3* 9.6*  HCT 30.4* 25.3* 29.4*  PLT 160 145* 157   Coag's No results for input(s): APTT, INR in the last 168 hours. BMET  Recent Labs Lab 03/04/16 0621 03/06/16 0402 03/07/16 0415  NA 135 139 140  K 4.8 5.1 5.6*  CL 102 107 106  CO2 27 27 25   BUN 43* 50* 66*  CREATININE 1.40* 1.30* 1.53*  GLUCOSE 115* 107* 106*   Electrolytes  Recent Labs Lab 03/04/16 0621 03/06/16 0402 03/07/16 0415  CALCIUM 7.6* 8.7* 9.2  MG  --  2.7* 3.0*  PHOS  --  4.3 6.4*   Sepsis Markers  Recent  Labs Lab 03/03/16 1140  LATICACIDVEN 0.8   ABG  Recent Labs Lab 03/03/16 1443 03/04/16 0444 03/07/16 0500  PHART 7.29* 7.30* 7.26*  PCO2ART 63* 58* 65*  PO2ART 51* 73* 102   Liver Enzymes  Recent Labs Lab 03/03/16 1140  AST 25  ALT 14*  ALKPHOS 42  BILITOT 0.9  ALBUMIN 2.7*   Cardiac Enzymes No results for input(s): TROPONINI, PROBNP in the last 168 hours. Glucose  Recent Labs Lab 03/03/16 1148 03/03/16 2101  GLUCAP 119* 137*    Imaging Dg Chest Port 1 View  Result Date: 03/06/2016 CLINICAL DATA:  Evaluate tracheostomy catheter. EXAM: PORTABLE CHEST 1 VIEW COMPARISON:  03/05/2016 FINDINGS: Tracheostomy catheter appears unchanged with tip 2.5 cm above carina. Right lung airspace disease/edema and right lower lung consolidation/atelectasis again noted. There is no evidence of pneumothorax. There has been little interval change since the prior study. IMPRESSION: Little significant change as described. Electronically Signed   By: Harmon PierJeffrey  Hu M.D.   On: 03/06/2016 20:04     ASSESSMENT / PLAN:  70 yo white male with h/o COPD admitted to ICU for acute resp failure from hypercapnic resp failure from COPD exacerbation Patient with difficult airway and intubation emergent trach-complicated by ETOH withdrawal CT chest suggests multilobar pneumonia    PULMONARY-s/p trach-changed to ETT though stoma by ENT, will need trach replacement in 7 days -Respiratory Failure -continue Full MV support high PS setttings -continue Bronchodilator Therapy -Wean Fio2 and PEEP as tolerated   CARDIOVASCULAR BP stable, ICU monitoring needed  RENAL Follow chem 7  GASTROINTESTINAL NPO for now Will need to consider Dubhoff placement  HEMATOLOGIC Follow CBC  INFECTIOUS Pneumonia, will treat  Empiric abx Obtain cultures   ENDOCRINE - ICU hypoglycemic\Hyperglycemia protocol   NEUROLOGIC - intubated and sedated - minimal sedation to achieve a RASS goal: -1  I have  personally obtained a history, examined the patient, evaluated Pertinent laboratory and RadioGraphic/imaging results, and  formulated the assessment and plan   The Patient requires high complexity decision making for assessment and support, frequent evaluation and titration of therapies, application of advanced monitoring technologies and extensive interpretation of multiple databases. Critical Care Time devoted to patient care services described in this note is 35 minutes.   Overall, patient is critically ill, prognosis is guarded.    Lucie LeatherKurian [name] Cambelle Suchecki, M.D.  [facility]  Medical Director [facility] Medical Director [facility]

## 2016-03-07 NOTE — Progress Notes (Signed)
Pt care assumed at this time. Report received.  

## 2016-03-08 ENCOUNTER — Inpatient Hospital Stay: Payer: Medicare Other

## 2016-03-08 DIAGNOSIS — G9341 Metabolic encephalopathy: Secondary | ICD-10-CM

## 2016-03-08 DIAGNOSIS — F1010 Alcohol abuse, uncomplicated: Secondary | ICD-10-CM

## 2016-03-08 LAB — CULTURE, BLOOD (ROUTINE X 2)
CULTURE: NO GROWTH
Culture: NO GROWTH

## 2016-03-08 LAB — PHOSPHORUS: Phosphorus: 3.4 mg/dL (ref 2.5–4.6)

## 2016-03-08 LAB — GLUCOSE, CAPILLARY
GLUCOSE-CAPILLARY: 86 mg/dL (ref 65–99)
GLUCOSE-CAPILLARY: 110 mg/dL — AB (ref 65–99)

## 2016-03-08 LAB — MAGNESIUM: MAGNESIUM: 2.9 mg/dL — AB (ref 1.7–2.4)

## 2016-03-08 MED ORDER — METHYLPREDNISOLONE SODIUM SUCC 40 MG IJ SOLR: 30.0000 mg | Freq: Every day | INTRAMUSCULAR | Status: DC

## 2016-03-08 MED ORDER — VITAL HIGH PROTEIN PO LIQD
1000.0000 mL | ORAL | Status: DC
  Administered 2016-03-08: 1000 mL
  Administered 2016-03-09: 15:00:00

## 2016-03-08 MED ORDER — METHYLPREDNISOLONE SODIUM SUCC 40 MG IJ SOLR: 20.0000 mg | Freq: Every day | INTRAMUSCULAR | Status: DC

## 2016-03-08 MED ORDER — METHYLPREDNISOLONE SODIUM SUCC 40 MG IJ SOLR
40.0000 mg | Freq: Every day | INTRAMUSCULAR | Status: DC
  Administered 2016-03-09: 40 mg via INTRAVENOUS
  Filled 2016-03-08: qty 1

## 2016-03-08 MED ORDER — FREE WATER
200.0000 mL | Freq: Three times a day (TID) | Status: DC
  Administered 2016-03-08 – 2016-03-09 (×4): 200 mL

## 2016-03-08 MED ORDER — METHYLPREDNISOLONE SODIUM SUCC 40 MG IJ SOLR: 10.0000 mg | Freq: Every day | INTRAMUSCULAR | Status: DC

## 2016-03-08 MED ORDER — PRO-STAT SUGAR FREE PO LIQD
60.0000 mL | Freq: Three times a day (TID) | ORAL | Status: DC
  Administered 2016-03-08 – 2016-03-09 (×3): 60 mL

## 2016-03-08 NOTE — Care Management (Signed)
Spoke again with patient's brother [name]. He states that "he is overwhelmed with everything that needs to be taken care of and still has to work". He asked that this RNCM call him tomorrow after 2PM as [facility] RNCM will continue to follow.

## 2016-03-08 NOTE — Care Management (Addendum)
Message left for patient's brother [name] to see if he'd picked a LTAC facility. [name] called this RNCM back and will [facility] today. [facility] can accept patient today and [name] is aware of that. [facility] had not responded so I contacted Indiaarla with [facility] and she will review chart as she never received my notification Friday.  [facility] can transport ET via trach.

## 2016-03-08 NOTE — Progress Notes (Signed)
PULMONARY / CRITICAL CARE MEDICINE   Name: Chad Huffman MRN: [id number] DOB: Sep 09, 1945    ADMISSION DATE:  03/03/2016 CHIEF COMPLAINT:  Acute resp failure   STUDIES:  CT chest -RT sided multifocal opacities c/w pneumonia   HISTORY OF PRESENT ILLNESS:  70 yo white male seen for acute resp failure Patient was found unresponsive at home after sister found patient, EMS arrived and noted to have posturing and o2sats in 60's Patient was bought to ER, GCS<8, not protecting airway, increased WOB patient had multiple attempts at intubation and subsequently went to OR for emergent trach by ENT  Patient currently unresponsive, gcs<8T on vent s/p trach  - It is unknown how long he was down but he was discharged from the hospital 3 days ago after an acute respiratory failure episode with hypoxia and hypoxemia to be secondary to his COPD.    -He has extensive sequela of traumatic injury including what appears to be dependent lividity on his extremities, bruising on his lower extremities, dependent edema in his scrotum, swelling around his eyes and chemosis more notable on the left than the right, all consistent with being down for an extended period of time.  No other history is currently available.    SIGNIFICANT EVENTS: 9/13-failed intubation, emergent trach 9/15 alert and awake, on full vent support fio1 100%, PEEP at 10 Needs trach and Vent to survivetrach changed out to #8ETT into trachea due to cuff leak 9/16 remains on full vent support, fio2 at 75%, PEEP 10 9/17 remains on full  Vent support fio2 at 60% PEEP 5, CXR shows ETT in place    REVIEW OF SYSTEMS: unobtainable due to critical illness    VITAL SIGNS: Temp:  [97.3 F (36.3 C)-98.7 F (37.1 C)] 98.6 F (37 C) (09/18 1200) Pulse Rate:  [70-90] 82 (09/18 1500) Resp:  [13-19] 18 (09/18 1500) BP: (105-149)/(58-103) 132/74 (09/18 1500) SpO2:  [90 %-97 %] 97 % (09/18 1500) FiO2 (%):  [50 %-60 %] 50 % (09/18 1131) Weight:   [241 lb 2.9 oz (109.4 kg)] 241 lb 2.9 oz (109.4 kg) (09/18 0347) HEMODYNAMICS:   VENTILATOR SETTINGS: Vent Mode: PRVC FiO2 (%):  [50 %-60 %] 50 % Set Rate:  [15 bmp-18 bmp] 18 bmp Vt Set:  [500 mL] 500 mL PEEP:  [5 cmH20] 5 cmH20 Plateau Pressure:  [16 cmH20] 16 cmH20 INTAKE / OUTPUT:  Intake/Output Summary (Last 24 hours) at 03/08/16 1545 Last data filed at 03/08/16 1530  Gross per 24 hour  Intake          1688.62 ml  Output             2130 ml  Net          -441.38 ml    PHYSICAL EXAMINATION: PHYSICAL EXAMINATION: Physical Examination:   GENERAL:critically ill appearing, s/p trach HEAD: Normocephalic, atraumatic.  EYES: Pupils equal, round, reactive to light.  No scleral icterus.  MOUTH: Moist mucosal membrane. NECK: Supple. No thyromegaly. No nodules. No JVD. c collar in place s/p trach PULMONARY: Diffuse coarse rhonchi right sided +wheezes +rhonchi CARDIOVASCULAR: S1 and S2. Regular rate and rhythm. No murmurs, rubs, or gallops.  GASTROINTESTINAL: Soft, nontender, +distended. No masses. Positive bowel sounds. No hepatosplenomegaly.  MUSCULOSKELETAL: No swelling, clubbing, or edema.  NEUROLOGIC: GCS<8T SKIN:intact,warm,dry eccymosis, scrotal edema    ABX  9/13 Vancomycin/Aztreonam--> Cultures pending-normal resp flora  CBC  Recent Labs Lab 03/06/16 0402 03/07/16 0415 03/07/16 2333  WBC 15.7* 9.6 6.8  HGB 8.3* 9.6*  7.9*  HCT 25.3* 29.4* 24.3*  PLT 145* 157 125*   Coag's  Recent Labs Lab 03/07/16 2333  APTT 31   BMET  Recent Labs Lab 03/06/16 0402 03/07/16 0415 03/07/16 2333  NA 139 140 140  K 5.1 5.6* 5.5*  CL 107 106 108  CO2 27 25 29   BUN 50* 66* 67*  CREATININE 1.30* 1.53* 1.31*  GLUCOSE 107* 106* 106*   Electrolytes  Recent Labs Lab 03/06/16 0402 03/07/16 0415 03/07/16 2333  CALCIUM 8.7* 9.2 8.7*  MG 2.7* 3.0* 3.0*  PHOS 4.3 6.4* 5.2*   Sepsis Markers  Recent Labs Lab 03/03/16 1140  LATICACIDVEN 0.8   ABG  Recent  Labs Lab 03/03/16 1443 03/04/16 0444 03/07/16 0500  PHART 7.29* 7.30* 7.26*  PCO2ART 63* 58* 65*  PO2ART 51* 73* 102   Liver Enzymes  Recent Labs Lab 03/03/16 1140  AST 25  ALT 14*  ALKPHOS 42  BILITOT 0.9  ALBUMIN 2.7*   Cardiac Enzymes No results for input(s): TROPONINI, PROBNP in the last 168 hours. Glucose  Recent Labs Lab 03/03/16 1148 03/03/16 2101  GLUCAP 119* 137*    Imaging Dg Abd 1 View  Result Date: 03/08/2016 CLINICAL DATA:  Soft feeding tube placement. EXAM: ABDOMEN - 1 VIEW COMPARISON:  02/18/2016 FINDINGS: Soft feeding tube has its tip in the midportion of the stomach. Gas pattern unremarkable. Right hemidiaphragm elevated. IMPRESSION: Soft feeding tube in the gastric body. Electronically Signed   By: Paulina FusiMark  Shogry M.D.   On: 03/08/2016 15:22   Dg Chest Port 1 View  Result Date: 03/08/2016 CLINICAL DATA:  PICC line placement EXAM: PORTABLE CHEST 1 VIEW COMPARISON:  03/07/2016 FINDINGS: A right PICC catheter is been placed with tip over the cavoatrial junction region. No pneumothorax. Tracheostomy tube with tip measuring 1.9 cm above the carina. Postoperative changes in the cervical spine. Mild cardiac enlargement without vascular congestion. Shallow inspiration. Small bilateral pleural effusions with basilar atelectasis. Right perihilar infiltration could represent pneumonia or asymmetric edema. Old left rib fractures. IMPRESSION: Right PICC catheter with tip over the cavoatrial junction region. No pneumothorax. Persistent bilateral pneumothoraces, bibasilar atelectasis, and infiltration or edema in the right perihilar region. Electronically Signed   By: Burman NievesWilliam  Stevens M.D.   On: 03/08/2016 00:52   Lines: 9/17 R PICC>  ASSESSMENT / PLAN:  70 yo white male with h/o COPD admitted to ICU for acute resp failure from hypercapnic resp failure from COPD exacerbation Patient with difficult airway and intubation emergent trach-complicated by ETOH withdrawal CT  chest suggests multilobar pneumonia    PULMONARY-s/p trach-changed to ETT though stoma by ENT, will need trach replacement in 7 days -Respiratory Failure -continue Full MV support high PS setttings -continue Bronchodilator Therapy -Wean Fio2 and PEEP as tolerated -brother considering [facility], he is already approved, just waiting on brother's agreement.    CARDIOVASCULAR BP stable, ICU monitoring needed  RENAL Follow chem 7  GASTROINTESTINAL NPO for now Will need to consider Dubhoff placement  HEMATOLOGIC Follow CBC  INFECTIOUS Pneumonia - Right perihilar region -will treat  Empiric abx -Obtain cultures   ENDOCRINE - ICU hypoglycemic\Hyperglycemia protocol   NEUROLOGIC - intubated and sedated - minimal sedation to achieve a RASS goal: -1  I have personally obtained a history, examined the patient, evaluated Pertinent laboratory and RadioGraphic/imaging results, and  formulated the assessment and plan   The Patient requires high complexity decision making for assessment and support, frequent evaluation and titration of therapies, application of advanced monitoring technologies  and extensive interpretation of multiple databases. Critical Care Time devoted to patient care services described in this note is 35 minutes.   Overall, patient is critically ill, prognosis is guarded.    Stephanie Acre, MD [facility] Pager [phone] (please enter 7-digits) On Call Pager - [phone] (please enter 7-digits)

## 2016-03-08 NOTE — Discharge Summary (Signed)
Physician Discharge Summary  Patient ID: Chad Huffman MRN: [id number] DOB/AGE: Nov 07, 1945 70 y.o.  Admit date: 03/03/2016 Discharge date: 03/08/2016    Discharge Diagnoses:  Acute Respiratory Failure-s/p trach-changed to ETT (8.27m) though stoma by ENT, will need trach replacement in 7 days Pneumonia - Right perihilar region                                                                   DISCHARGE PLAN BY DIAGNOSIS   PULMONARY-s/p trach-changed to ETT though stoma by ENT, will need trach replacement in 7 days -Respiratory Failure -continue Full MV support high PS setttings -continue Bronchodilator Therapy -Wean Fio2 and PEEP as tolerated  CARDIOVASCULAR BP stable, ICU monitoring needed  RENAL Follow chem 7  GASTROINTESTINAL TF with NGT (Dubhoff, placed 9/18)  HEMATOLOGIC Follow CBC  INFECTIOUS Pneumonia - Right perihilar region -will treat  Empiric abx (Aztreonam/Levaquin x 7 days total) - last day 9/19 -cultures pending-normal resp flora    ENDOCRINE - ICU hypoglycemic\Hyperglycemia protocol   NEUROLOGIC - intubated and sedated - minimal sedation to achieve a RASS goal: -1                 DISCHARGE SUMMARY   Chad ASHRAFis a 70y.o. y/o male patient who was  found unresponsive at home after sister found patient, EMS arrived and noted to have posturing and o2sats in 60'sPatient was bought to ER, GCS<8, not protecting airway, increased WOBpatient had multiple attempts at intubation and subsequently went to OR for emergent trach by ENT.Patient currently unresponsive, gcs<8T on vent s/p trach It is unknown how long he was down but he was discharged from the hospital 3 days ago after an acute respiratory failure episode with hypoxia and hypoxemia to be secondary to his COPD.  He has extensive sequela of traumatic injury including what appears to be dependent lividity on his extremities, bruising on his lower extremities, dependent edema in his  scrotum, swelling around his eyes and chemosis more notable on the left than the right, all consistent with being down for an extended period of time.               SIGNIFICANT DIAGNOSTIC STUDIES  9/13>>CT chest -RT sided multifocal opacities c/w pneumonia  SIGNIFICANT EVENTS 9/13-failed intubation, emergent trach 9/15 alert and awake, on full vent support fio1 100%, PEEP at 10 Needs trach and Vent to survivetrach changed out to #8ETT into trachea due to cuff leak 9/16 remains on full vent support, fio2 at 75%, PEEP 10 9/17 remains on full  Vent support fio2 at 60% PEEP 5, CXR shows ETT in place  MICRO DATA  9/13 BC>> NGTD 9/14 [place]> Cultures pending-normal resp flora  ANTIBIOTICS 9/13 Vancomycin/Aztreonam--> Cultures pending-normal resp flora  CONSULTS 9/13 PCCM>>  TUBES / LINES 9/17 R PICC>>   Discharge Exam: GENERAL:critically ill appearing, s/p trach HEAD: Normocephalic, atraumatic.  EYES: Pupils equal, round, reactive to light.  No scleral icterus.  MOUTH: Moist mucosal membrane. NECK: Supple. No thyromegaly. No nodules. No JVD. c collar in place s/p trach PULMONARY: Diffuse coarse rhonchi right sided +wheezes +rhonchi CARDIOVASCULAR: S1 and S2. Regular rate and rhythm. No murmurs, rubs, or gallops.  GASTROINTESTINAL: Soft, nontender, +distended. No masses. Positive bowel sounds. No hepatosplenomegaly.  MUSCULOSKELETAL: No swelling, clubbing, or edema.  NEUROLOGIC: GCS<8T SKIN:intact,warm,dry eccymosis, scrotal edema  Vitals:   03/08/16 1700 03/08/16 1800 03/08/16 1900 03/08/16 2000  BP: (!) 159/82 (!) 136/114 (!) 155/114 (!) 144/59  Pulse: 95 (!) 106 98 92  Resp: _0 Temp:   98.8 F (37.1 C)   TempSrc:   Oral   SpO2: 91% (!) 89% 93% 95%  Weight:      Height:         Discharge Labs  BMET  Recent Labs Lab 03/03/16 1140 03/04/16 0621 03/06/16 0402 03/07/16 0415 03/07/16 2333 03/08/16 1622  NA 136 135 139 140 140  --   K 3.3* 4.8 5.1  5.6* 5.5*  --   CL 106 102 107 106 108  --   CO2 _1 --   GLUCOSE 101* 115* 107* 106* 106*  --   BUN 42* 43* 50* 66* 67*  --   CREATININE 1.03 1.40* 1.30* 1.53* 1.31*  --   CALCIUM 6.3* 7.6* 8.7* 9.2 8.7*  --   MG  --   --  2.7* 3.0* 3.0* 2.9*  PHOS  --   --  4.3 6.4* 5.2* 3.4    CBC  Recent Labs Lab 03/06/16 0402 03/07/16 0415 03/07/16 2333  HGB 8.3* 9.6* 7.9*  HCT 25.3* 29.4* 24.3*  WBC 15.7* 9.6 6.8  PLT 145* 157 125*    Anti-Coagulation No results for input(s): INR in the last 168 hours.   [place]  Discharged Condition: Chad Huffman has met maximum benefit of inpatient care and is medically stable and cleared for discharge.  Patient is pending follow up as above.       Pulmonary & Critical Care   Attending attestation: Agree with Discharge summary as stated above. Plan of care and treatment discussed with multidisciplinary team (nurses, case managers, pharmacists, respiratory therapists, dieticians).    Time spent on disposition:  Greater than 45 minutes.    Vilinda Boehringer, MD [facility] Pager [phone] (please enter 7-digits) On Call Pager - [phone] (please enter 7-digits)

## 2016-03-08 NOTE — Progress Notes (Signed)
Pharmacy Antibiotic Note  Chad LabradorDanny L Huffman is a 70 y.o. male admitted on 03/03/2016 with pneumonia and respiratory failure.  Pharmacy has been consulted for aztreonam levofloxacin dosing.  Plan: Pt is currently on day 6 of Levaquin 750 mg IV q 24 and aztreonam 2 gm IV q 8. Per MD, will discontinue tomorrow (9/19) after 7 days of therapy.  Height: 5\' 11"  (180.3 cm) Weight: 241 lb 2.9 oz (109.4 kg) IBW/kg (Calculated) : 75.3  Temp (24hrs), Avg:98.2 F (36.8 C), Min:97.3 F (36.3 C), Max:98.7 F (37.1 C)   Recent Labs Lab 03/03/16 1140 03/04/16 0621 03/06/16 0402 03/07/16 0415 03/07/16 2333  WBC 10.6 26.9* 15.7* 9.6 6.8  CREATININE 1.03 1.40* 1.30* 1.53* 1.31*  LATICACIDVEN 0.8  --   --   --   --     Estimated Creatinine Clearance: 67.9 mL/min (by C-G formula based on SCr of 1.31 mg/dL (H)).    Allergies  Allergen Reactions  . Ampicillin Swelling and Other (See Comments)    Unable to obtain enough information to answer additional questions about this medication.  Has patient had a PCN reaction causing immediate rash, facial/tongue/throat swelling, SOB or lightheadedness with hypotension: Yes Has patient had a PCN reaction causing severe rash involving mucus membranes or skin necrosis: No Has patient had a PCN reaction that required hospitalization No Has patient had a PCN reaction occurring within the last 10 years: Yes If all of the above answers are "NO", then may pr  . Nortriptyline Other (See Comments)    Sleep walking  . Brimonidine Other (See Comments)    Reaction:  Unknown   . Cyclobenzaprine Other (See Comments)    Reaction: Hallucinations  . Tizanidine Other (See Comments)    Reaction: Hallucinations  . Gabapentin Rash    Antimicrobials this admission: Aztreonam 9/13 >>  Levofloxacin 9/13 >>  Vancomycin 9/13 >> 9/14  Microbiology results: 9/13 BCx: No growth 9/13 UCx: No growth  9/14 TA: GPC, GNR 9/13 MRSA PCR: Negative  Thank you for allowing pharmacy to  be a part of this patient's care.  Horris LatinoHolly Arelis Neumeier, PharmD Pharmacy Resident 03/08/2016 4:02 PM

## 2016-03-08 NOTE — Progress Notes (Signed)
Nutrition Follow-up  DOCUMENTATION CODES:   Obesity unspecified  INTERVENTION:  -Discussed nutritional poc during ICU rounds with MD Mungal; plan to attempt to place NG (dobhoff) tube today, under IR if necessary, with plan to initate TF once placement confirmed -Recommend bowel regimen as no BM since admission  NUTRITION DIAGNOSIS:   Inadequate oral intake related to acute illness as evidenced by NPO status.  Being addressed via NG, TF  GOAL:   Provide needs based on ASPEN/SCCM guidelines  MONITOR:   Labs, Weight trends, Vent status  REASON FOR ASSESSMENT:   Ventilator    ASSESSMENT:    70 yo male admitted after being found unresponsive at home, acute respiratory failure with failed attempts at intubation requiring emergent trach by ENT  Pt remains on vent support via trach, no NG tube at present. NPO day 5.   Diet Order:  Diet NPO time specified  Skin:  Reviewed, no issues  Last BM:  prior to admission   Labs: potassium 5.5, Creatinine 1.31  Meds: diprivan (831 kcals in past 24 hours)  Height:   Ht Readings from Last 1 Encounters:  03/03/16 5\' 11"  (1.803 m)    Weight:   Wt Readings from Last 1 Encounters:  03/08/16 241 lb 2.9 oz (109.4 kg)    BMI:  Body mass index is 33.64 kg/m.  Estimated Nutritional Needs:   Kcal:  1210-1540 kcals   Protein:  >/= 156 g  Fluid:  >/= 2.2 L  EDUCATION NEEDS:   No education needs identified at this time  Romelle StarcherCate Tachina Spoonemore MS, RD, LDN [phone] Pager  [phone] Weekend/On-Call Pager

## 2016-03-09 ENCOUNTER — Ambulatory Visit (HOSPITAL_COMMUNITY)
Admission: AD | Admit: 2016-03-09 | Discharge: 2016-03-09 | Disposition: A | Discharge status: Home / Self Care | Payer: Medicare Other | Source: Other Acute Inpatient Hospital | Attending: Internal Medicine | Admitting: Internal Medicine

## 2016-03-09 ENCOUNTER — Institutional Professional Consult (permissible substitution) (HOSPITAL_COMMUNITY): Payer: Self-pay

## 2016-03-09 ENCOUNTER — Inpatient Hospital Stay: Payer: Medicare Other

## 2016-03-09 ENCOUNTER — Inpatient Hospital Stay
Admission: RE | Admit: 2016-03-09 | Discharge: 2016-04-12 | Disposition: A | Discharge status: Skilled Nursing Facility | Payer: Self-pay | Attending: Internal Medicine | Admitting: Internal Medicine

## 2016-03-09 DIAGNOSIS — J441 Chronic obstructive pulmonary disease with (acute) exacerbation: Secondary | ICD-10-CM | POA: Insufficient documentation

## 2016-03-09 DIAGNOSIS — Z0189 Encounter for other specified special examinations: Secondary | ICD-10-CM

## 2016-03-09 DIAGNOSIS — W07XXXA Fall from chair, initial encounter: Secondary | ICD-10-CM

## 2016-03-09 DIAGNOSIS — J9602 Acute respiratory failure with hypercapnia: Secondary | ICD-10-CM | POA: Insufficient documentation

## 2016-03-09 DIAGNOSIS — Z4659 Encounter for fitting and adjustment of other gastrointestinal appliance and device: Secondary | ICD-10-CM

## 2016-03-09 DIAGNOSIS — Z431 Encounter for attention to gastrostomy: Secondary | ICD-10-CM

## 2016-03-09 DIAGNOSIS — J9819 Other pulmonary collapse: Secondary | ICD-10-CM

## 2016-03-09 DIAGNOSIS — R509 Fever, unspecified: Secondary | ICD-10-CM

## 2016-03-09 DIAGNOSIS — J189 Pneumonia, unspecified organism: Secondary | ICD-10-CM | POA: Insufficient documentation

## 2016-03-09 DIAGNOSIS — Z931 Gastrostomy status: Secondary | ICD-10-CM

## 2016-03-09 DIAGNOSIS — J9690 Respiratory failure, unspecified, unspecified whether with hypoxia or hypercapnia: Secondary | ICD-10-CM

## 2016-03-09 LAB — PHOSPHORUS: Phosphorus: 3.3 mg/dL (ref 2.5–4.6)

## 2016-03-09 LAB — BLOOD GAS, ARTERIAL
Acid-Base Excess: 4.8 mmol/L — ABNORMAL HIGH (ref 0.0–2.0)
Bicarbonate: 30.6 mmol/L — ABNORMAL HIGH (ref 20.0–28.0)
FIO2: 0.6
MECHVT: 500 mL
O2 Saturation: 98.4 %
PEEP: 5 cmH2O
Patient temperature: 98.6
pCO2 arterial: 61.5 mmHg — ABNORMAL HIGH (ref 32.0–48.0)
pH, Arterial: 7.318 — ABNORMAL LOW (ref 7.350–7.450)
pO2, Arterial: 139 mmHg — ABNORMAL HIGH (ref 83.0–108.0)

## 2016-03-09 LAB — GLUCOSE, CAPILLARY
GLUCOSE-CAPILLARY: 122 mg/dL — AB (ref 65–99)
Glucose-Capillary: 88 mg/dL (ref 65–99)
Glucose-Capillary: 90 mg/dL (ref 65–99)
Glucose-Capillary: 84 mg/dL (ref 65–99)

## 2016-03-09 LAB — MAGNESIUM: MAGNESIUM: 2.5 mg/dL — AB (ref 1.7–2.4)

## 2016-03-09 MED ORDER — DOCUSATE SODIUM 50 MG/5ML PO LIQD: 100.0000 mg | Freq: Two times a day (BID) | ORAL | Status: DC

## 2016-03-09 MED ORDER — SENNOSIDES 8.8 MG/5ML PO SYRP: 5.0000 mL | ORAL_SOLUTION | Freq: Two times a day (BID) | ORAL | Status: DC

## 2016-03-09 NOTE — Care Management (Signed)
Received call from patient's brother [name] requesting that patient be transferred to [facility] today. Patient will go to room 5e18 with Dr. Sharyon MedicusHijazi. RN updated. No further RNCM needs.

## 2016-03-09 NOTE — Progress Notes (Signed)
Discharge order in place per Dr. Sung AmabileSimonds. Patient's family gave okay for patient to leave at this time. [name] given report and has departed with patient. Patient and family updated on plan of care.  Trudee KusterBrandi R Mansfield

## 2016-03-09 NOTE — Progress Notes (Signed)
Nutrition Follow-up  DOCUMENTATION CODES:   Obesity unspecified  INTERVENTION:  -With current diprivan, recommend continuing current TF regimen. Continue to assess -Recommend addition of bowel regimen as no BM since admission -Recommend rechecking potassium  NUTRITION DIAGNOSIS:   Inadequate oral intake related to acute illness as evidenced by NPO status.  Being addressed via TF  GOAL:   Provide needs based on ASPEN/SCCM guidelines  MONITOR:   Labs, Weight trends, Vent status  REASON FOR ASSESSMENT:   Ventilator    ASSESSMENT:    70 yo male admitted after being found unresponsive at home, acute respiratory failure with failed attempts at intubation requiring emergent trach by ENT  Pt remains on vent via trach, possible discharge to LTAC today  NG tube successfully placed yesterday and Vital High Protein infusing at rate of 20 ml/hr with Prostat 6 packets per day.   Diprivan (505 kcals in 24 hours)  Diet Order:  Diet NPO time specified  Skin:  Reviewed, no issues  Last BM:  prior to admission  Labs: potassium 5.5 (9/17), phosphorus wdl, magnesium 2.5  Glucose Profile:   Recent Labs  03/09/16 0007 03/09/16 0356 03/09/16 0756  GLUCAP 122* 88 90   Meds: reviewed  Height:   Ht Readings from Last 1 Encounters:  03/03/16 5\' 11"  (1.803 m)    Weight:   Wt Readings from Last 1 Encounters:  03/08/16 241 lb 2.9 oz (109.4 kg)   Filed Weights   03/05/16 0658 03/07/16 0500 03/08/16 0347  Weight: 246 lb 11.1 oz (111.9 kg) 242 lb 1 oz (109.8 kg) 241 lb 2.9 oz (109.4 kg)    BMI:  Body mass index is 33.64 kg/m.  Estimated Nutritional Needs:   Kcal:  1210-1540 kcals   Protein:  >/= 156 g  Fluid:  >/= 2.2 L  EDUCATION NEEDS:   No education needs identified at this time  Romelle StarcherCate Jaren Vanetten MS, RD, LDN [phone] Pager  [phone] Weekend/On-Call Pager

## 2016-03-09 NOTE — Progress Notes (Signed)
[facility] for levofloxacin and aztreonam dosing and constipation management in this 70 y.o. male admitted on 03/03/2016 with pneumonia and respiratory failure.   1. Antibiotics - Pt is currently on day 7 of Levaquin 750 mg IV q 24 and aztreonam 2 gm IV q 8. Per MD, will discontinue today.  2. Constipation management - Pt has no documented BM. Initiated liquid senokot 5 mL and colace 100 mg BID. Will continue to monitor.  Allergies  Allergen Reactions  . Ampicillin Swelling and Other (See Comments)    Unable to obtain enough information to answer additional questions about this medication.  Has patient had a PCN reaction causing immediate rash, facial/tongue/throat swelling, SOB or lightheadedness with hypotension: Yes Has patient had a PCN reaction causing severe rash involving mucus membranes or skin necrosis: No Has patient had a PCN reaction that required hospitalization No Has patient had a PCN reaction occurring within the last 10 years: Yes If all of the above answers are "NO", then may pr  . Nortriptyline Other (See Comments)    Sleep walking  . Brimonidine Other (See Comments)    Reaction:  Unknown   . Cyclobenzaprine Other (See Comments)    Reaction: Hallucinations  . Tizanidine Other (See Comments)    Reaction: Hallucinations  . Gabapentin Rash    Patient Measurements: Height: 5' 11"  (180.3 cm) Weight: 241 lb 2.9 oz (109.4 kg) IBW/kg (Calculated) : 75.3   Vital Signs: Temp: 98 F (36.7 C) (09/19 1200) Temp Source: Oral (09/19 1200) BP: 168/76 (09/19 1400) Pulse Rate: 76 (09/19 1400) Intake/Output from previous day: 09/18 0701 - 09/19 0700 In: 1386.5 [I.V.:668.9; NG/GT:667.7; IV Piggyback:50] Out: 2645 [Urine:2645] Intake/Output from this shift: Total I/O In: 597 [I.V.:337; NG/GT:160; IV Piggyback:100] Out: 1160 [Urine:1160]  Labs:  Recent Labs  03/07/16 0415 03/07/16 2333  WBC 9.6 6.8  HGB 9.6* 7.9*  HCT 29.4* 24.3*  PLT 157 125*   APTT  --  31     Recent Labs  03/06/16 1844  03/07/16 0415 03/07/16 2333 03/08/16 1622 03/09/16 0420  NA  --   --  140 140  --   --   K  --   --  5.6* 5.5*  --   --   CL  --   --  106 108  --   --   CO2  --   --  25 29  --   --   GLUCOSE  --   --  106* 106*  --   --   BUN  --   --  66* 67*  --   --   CREATININE  --   --  1.53* 1.31*  --   --   CALCIUM  --   --  9.2 8.7*  --   --   MG  --   < > 3.0* 3.0* 2.9* 2.5*  PHOS  --   < > 6.4* 5.2* 3.4 3.3  TRIG 159*  --   --   --   --   --   < > = values in this interval not displayed. Estimated Creatinine Clearance: 67.9 mL/min (by C-G formula based on SCr of 1.31 mg/dL (H)).    Recent Labs  03/09/16 0356 03/09/16 0756 03/09/16 1140  GLUCAP 88 90 84    Medications:  Scheduled:  . aztreonam  2 g Intravenous Q8H  . budesonide (PULMICORT) nebulizer solution  0.25 mg Nebulization BID  . chlorhexidine gluconate (MEDLINE KIT)  15 mL Mouth Rinse BID  . sennosides  5 mL Oral BID   And  . docusate  100 mg Oral BID  . enoxaparin (LOVENOX) injection  40 mg Subcutaneous Q24H  . famotidine (PEPCID) IV  20 mg Intravenous Q12H  . feeding supplement (PRO-STAT SUGAR FREE 64)  60 mL Per Tube TID  . feeding supplement (VITAL HIGH PROTEIN)  1,000 mL Per Tube Q24H  . free water  200 mL Per Tube Q8H  . ipratropium-albuterol  3 mL Nebulization Q4H  . levofloxacin (LEVAQUIN) IV  750 mg Intravenous Q24H  . mouth rinse  15 mL Mouth Rinse 10 times per day  . methylPREDNISolone (SOLU-MEDROL) injection  40 mg Intravenous Daily   Followed by  . [START ON 03/11/2016] methylPREDNISolone (SOLU-MEDROL) injection  30 mg Intravenous Daily   Followed by  . [START ON 03/13/2016] methylPREDNISolone (SOLU-MEDROL) injection  20 mg Intravenous Daily   Followed by  . [START ON 03/15/2016] methylPREDNISolone (SOLU-MEDROL) injection  10 mg Intravenous Daily  . sodium chloride flush  10-40 mL Intracatheter Q12H   Infusions:  . norepinephrine Stopped (03/04/16  1723)  . propofol (DIPRIVAN) infusion 45 mcg/kg/min (03/09/16 1435)     Darrow Bussing, PharmD Pharmacy Resident 03/09/2016 3:59 PM

## 2016-03-10 LAB — URINALYSIS, ROUTINE W REFLEX MICROSCOPIC
Glucose, UA: NEGATIVE mg/dL
Hgb urine dipstick: NEGATIVE
KETONES UR: 15 mg/dL — AB
Nitrite: NEGATIVE
PROTEIN: NEGATIVE mg/dL
Specific Gravity, Urine: 1.017 (ref 1.005–1.030)
pH: 6 (ref 5.0–8.0)

## 2016-03-10 LAB — COMPREHENSIVE METABOLIC PANEL
ALBUMIN: 2.5 g/dL — AB (ref 3.5–5.0)
ALK PHOS: 41 U/L (ref 38–126)
ALT: 13 U/L — ABNORMAL LOW (ref 17–63)
ANION GAP: 6 (ref 5–15)
AST: 18 U/L (ref 15–41)
BUN: 37 mg/dL — ABNORMAL HIGH (ref 6–20)
CHLORIDE: 107 mmol/L (ref 101–111)
CO2: 29 mmol/L (ref 22–32)
Calcium: 8.8 mg/dL — ABNORMAL LOW (ref 8.9–10.3)
Creatinine, Ser: 0.86 mg/dL (ref 0.61–1.24)
GFR calc Af Amer: >60 mL/min (ref >60)
GFR calc non Af Amer: >60 mL/min (ref >60)
GLUCOSE: 100 mg/dL — AB (ref 65–99)
POTASSIUM: 4.6 mmol/L (ref 3.5–5.1)
SODIUM: 142 mmol/L (ref 135–145)
Total Bilirubin: 0.5 mg/dL (ref 0.3–1.2)
Total Protein: 5.3 g/dL — ABNORMAL LOW (ref 6.5–8.1)

## 2016-03-10 LAB — PROTIME-INR
INR: 1.15
PROTHROMBIN TIME: 14.8 s (ref 11.4–15.2)

## 2016-03-10 LAB — CBC WITH DIFFERENTIAL/PLATELET
BASOS ABS: 0.1 10*3/uL (ref 0.0–0.1)
Basophils Relative: 1 %
EOS ABS: 0.2 10*3/uL (ref 0.0–0.7)
Eosinophils Relative: 2 %
HCT: 32.2 % — ABNORMAL LOW (ref 39.0–52.0)
Hemoglobin: 9.7 g/dL — ABNORMAL LOW (ref 13.0–17.0)
LYMPHS PCT: 13 %
Lymphs Abs: 1.4 10*3/uL (ref 0.7–4.0)
MCH: 28.3 pg (ref 26.0–34.0)
MCHC: 30.1 g/dL (ref 30.0–36.0)
MCV: 93.9 fL (ref 78.0–100.0)
MONO ABS: 1 10*3/uL (ref 0.1–1.0)
Monocytes Relative: 9 %
NEUTROS ABS: 8.3 10*3/uL — AB (ref 1.7–7.7)
NEUTROS PCT: 75 %
PLATELETS: 142 10*3/uL — AB (ref 150–400)
RBC: 3.43 MIL/uL — ABNORMAL LOW (ref 4.22–5.81)
RDW: 18.5 % — AB (ref 11.5–15.5)
WBC: 11 10*3/uL — ABNORMAL HIGH (ref 4.0–10.5)

## 2016-03-10 LAB — URINE MICROSCOPIC-ADD ON

## 2016-03-10 LAB — MAGNESIUM: Magnesium: 2.1 mg/dL (ref 1.7–2.4)

## 2016-03-10 LAB — TSH: TSH: 6.972 u[IU]/mL — AB (ref 0.350–4.500)

## 2016-03-10 LAB — PROCALCITONIN

## 2016-03-10 LAB — PHOSPHORUS: Phosphorus: 3.2 mg/dL (ref 2.5–4.6)

## 2016-03-11 LAB — HEMOGLOBIN A1C
HEMOGLOBIN A1C: 5.4 % (ref 4.8–5.6)
MEAN PLASMA GLUCOSE: 108 mg/dL

## 2016-03-11 LAB — URINE CULTURE: CULTURE: NO GROWTH

## 2016-03-14 ENCOUNTER — Other Ambulatory Visit (HOSPITAL_COMMUNITY): Payer: Self-pay

## 2016-03-14 ENCOUNTER — Institutional Professional Consult (permissible substitution) (HOSPITAL_COMMUNITY): Payer: Self-pay

## 2016-03-14 MED ORDER — DIATRIZOATE MEGLUMINE & SODIUM 66-10 % PO SOLN
ORAL | Status: AC
  Filled 2016-03-14: qty 30

## 2016-03-15 ENCOUNTER — Other Ambulatory Visit (HOSPITAL_COMMUNITY): Payer: Self-pay

## 2016-03-15 LAB — CBC
HEMATOCRIT: 27.6 % — AB (ref 39.0–52.0)
Hemoglobin: 7.8 g/dL — ABNORMAL LOW (ref 13.0–17.0)
MCH: 27.9 pg (ref 26.0–34.0)
MCHC: 28.3 g/dL — ABNORMAL LOW (ref 30.0–36.0)
MCV: 98.6 fL (ref 78.0–100.0)
PLATELETS: 158 10*3/uL (ref 150–400)
RBC: 2.8 MIL/uL — AB (ref 4.22–5.81)
RDW: 18.5 % — AB (ref 11.5–15.5)
WBC: 9.2 10*3/uL (ref 4.0–10.5)

## 2016-03-15 LAB — COMPREHENSIVE METABOLIC PANEL
ALT: 16 U/L — AB (ref 17–63)
AST: 18 U/L (ref 15–41)
Albumin: 2.3 g/dL — ABNORMAL LOW (ref 3.5–5.0)
Alkaline Phosphatase: 53 U/L (ref 38–126)
Anion gap: 5 (ref 5–15)
BILIRUBIN TOTAL: 1 mg/dL (ref 0.3–1.2)
BUN: 28 mg/dL — AB (ref 6–20)
CHLORIDE: 107 mmol/L (ref 101–111)
CO2: 33 mmol/L — ABNORMAL HIGH (ref 22–32)
CREATININE: 0.91 mg/dL (ref 0.61–1.24)
Calcium: 8.6 mg/dL — ABNORMAL LOW (ref 8.9–10.3)
Glucose, Bld: 97 mg/dL (ref 65–99)
Potassium: 4.3 mmol/L (ref 3.5–5.1)
Sodium: 145 mmol/L (ref 135–145)
TOTAL PROTEIN: 5.1 g/dL — AB (ref 6.5–8.1)

## 2016-03-19 ENCOUNTER — Other Ambulatory Visit (HOSPITAL_COMMUNITY): Payer: Self-pay

## 2016-03-19 LAB — BASIC METABOLIC PANEL
Anion gap: 5 (ref 5–15)
BUN: 24 mg/dL — ABNORMAL HIGH (ref 6–20)
CHLORIDE: 106 mmol/L (ref 101–111)
CO2: 30 mmol/L (ref 22–32)
CREATININE: 0.78 mg/dL (ref 0.61–1.24)
Calcium: 8.6 mg/dL — ABNORMAL LOW (ref 8.9–10.3)
Glucose, Bld: 94 mg/dL (ref 65–99)
POTASSIUM: 4.5 mmol/L (ref 3.5–5.1)
SODIUM: 141 mmol/L (ref 135–145)

## 2016-03-19 LAB — CBC
HCT: 27.3 % — ABNORMAL LOW (ref 39.0–52.0)
HEMOGLOBIN: 7.8 g/dL — AB (ref 13.0–17.0)
MCH: 27.8 pg (ref 26.0–34.0)
MCHC: 28.6 g/dL — AB (ref 30.0–36.0)
MCV: 97.2 fL (ref 78.0–100.0)
Platelets: 181 10*3/uL (ref 150–400)
RBC: 2.81 MIL/uL — AB (ref 4.22–5.81)
RDW: 18.6 % — ABNORMAL HIGH (ref 11.5–15.5)
WBC: 6.9 10*3/uL (ref 4.0–10.5)

## 2016-03-22 LAB — BASIC METABOLIC PANEL
Anion gap: 4 — ABNORMAL LOW (ref 5–15)
BUN: 29 mg/dL — AB (ref 6–20)
CALCIUM: 8.8 mg/dL — AB (ref 8.9–10.3)
CHLORIDE: 101 mmol/L (ref 101–111)
CO2: 35 mmol/L — ABNORMAL HIGH (ref 22–32)
CREATININE: 0.78 mg/dL (ref 0.61–1.24)
GFR calc Af Amer: >60 mL/min (ref >60)
GFR calc non Af Amer: >60 mL/min (ref >60)
Glucose, Bld: 118 mg/dL — ABNORMAL HIGH (ref 65–99)
Potassium: 5.9 mmol/L — ABNORMAL HIGH (ref 3.5–5.1)
SODIUM: 140 mmol/L (ref 135–145)

## 2016-03-22 LAB — CBC WITH DIFFERENTIAL/PLATELET
BASOS PCT: 0 %
Basophils Absolute: 0 10*3/uL (ref 0.0–0.1)
EOS ABS: 0.2 10*3/uL (ref 0.0–0.7)
EOS PCT: 4 %
HCT: 30 % — ABNORMAL LOW (ref 39.0–52.0)
HEMOGLOBIN: 8.5 g/dL — AB (ref 13.0–17.0)
LYMPHS ABS: 0.6 10*3/uL — AB (ref 0.7–4.0)
Lymphocytes Relative: 9 %
MCH: 28.1 pg (ref 26.0–34.0)
MCHC: 28.3 g/dL — AB (ref 30.0–36.0)
MCV: 99.3 fL (ref 78.0–100.0)
MONO ABS: 0.4 10*3/uL (ref 0.1–1.0)
MONOS PCT: 6 %
Neutro Abs: 5.6 10*3/uL (ref 1.7–7.7)
Neutrophils Relative %: 81 %
Platelets: 174 10*3/uL (ref 150–400)
RBC: 3.02 MIL/uL — ABNORMAL LOW (ref 4.22–5.81)
RDW: 18.4 % — AB (ref 11.5–15.5)
WBC: 6.9 10*3/uL (ref 4.0–10.5)

## 2016-03-23 ENCOUNTER — Other Ambulatory Visit (HOSPITAL_COMMUNITY): Payer: Self-pay

## 2016-03-23 LAB — POTASSIUM: POTASSIUM: 4.9 mmol/L (ref 3.5–5.1)

## 2016-03-26 ENCOUNTER — Other Ambulatory Visit (HOSPITAL_COMMUNITY): Payer: Self-pay

## 2016-03-27 LAB — BASIC METABOLIC PANEL
Anion gap: 4 — ABNORMAL LOW (ref 5–15)
BUN: 22 mg/dL — ABNORMAL HIGH (ref 6–20)
CHLORIDE: 89 mmol/L — AB (ref 101–111)
CO2: 44 mmol/L — AB (ref 22–32)
CREATININE: 0.75 mg/dL (ref 0.61–1.24)
Calcium: 8.8 mg/dL — ABNORMAL LOW (ref 8.9–10.3)
GFR calc non Af Amer: >60 mL/min (ref >60)
Glucose, Bld: 119 mg/dL — ABNORMAL HIGH (ref 65–99)
POTASSIUM: 4.3 mmol/L (ref 3.5–5.1)
Sodium: 137 mmol/L (ref 135–145)

## 2016-03-27 LAB — CBC WITH DIFFERENTIAL/PLATELET
BASOS PCT: 0 %
Basophils Absolute: 0 10*3/uL (ref 0.0–0.1)
Eosinophils Absolute: 0.4 10*3/uL (ref 0.0–0.7)
Eosinophils Relative: 7 %
HEMATOCRIT: 28 % — AB (ref 39.0–52.0)
HEMOGLOBIN: 8 g/dL — AB (ref 13.0–17.0)
LYMPHS ABS: 0.8 10*3/uL (ref 0.7–4.0)
Lymphocytes Relative: 14 %
MCH: 28.3 pg (ref 26.0–34.0)
MCHC: 28.6 g/dL — AB (ref 30.0–36.0)
MCV: 98.9 fL (ref 78.0–100.0)
MONOS PCT: 13 %
Monocytes Absolute: 0.7 10*3/uL (ref 0.1–1.0)
NEUTROS ABS: 3.6 10*3/uL (ref 1.7–7.7)
NEUTROS PCT: 66 %
Platelets: 165 10*3/uL (ref 150–400)
RBC: 2.83 MIL/uL — AB (ref 4.22–5.81)
RDW: 18.5 % — ABNORMAL HIGH (ref 11.5–15.5)
WBC: 5.4 10*3/uL (ref 4.0–10.5)

## 2016-03-28 LAB — BRAIN NATRIURETIC PEPTIDE: B Natriuretic Peptide: 145.4 pg/mL — ABNORMAL HIGH (ref 0.0–100.0)

## 2016-03-30 NOTE — Consult Note (Signed)
Chief Complaint: Patient was seen in consultation today for percutaneous gastric tube placement at the request of Dr Carron Curie  Referring Physician(s): Dr Ardeth Sportsman  Supervising Physician: Oley Balm  Patient Status: Inpatient  History of Present Illness: Chad Huffman is a 70 y.o. male   COPD Hypoxia Respiratory failure HTN; CAD Admitted to [facility] for Vent/trach management Protein calorie malnutrition FTT Dysphagia Need for long term care Request for percutaneous gastric tube placement Dr Deanne Coffer has reviewed imaging and approves procedure    Past Medical History:  Diagnosis Date  . Antalgic gait   . Carotid artery occlusion   . Chronic neck and back pain   . Complication of anesthesia    pt has had cervical fusion-limited neck flextion  . COPD (chronic obstructive pulmonary disease) (HCC)   . Depression   . DJD (degenerative joint disease)    Right shoulder  . Hypertension   . Lumbago   . Peripheral neuropathy [facility])     Past Surgical History:  Procedure Laterality Date  . ABDOMINAL AORTIC ANEURYSM REPAIR  2009   Endovascular AAA repair  . ABDOMINAL AORTIC ANEURYSM REPAIR    . CARPAL TUNNEL RELEASE     bilateral CTS  . HAMMER TOE SURGERY  12/10/2011   Procedure: HAMMER TOE CORRECTION;  Surgeon: Ernestene Kiel, DPM;  Location: [facility];  Service: Podiatry;  Laterality: Right;  Right hammertoe repair second right with 2.5x42 orthopro screw   . METATARSAL OSTEOTOMY  12/10/2011   Procedure: METATARSAL OSTEOTOMY;  Surgeon: Ernestene Kiel, DPM;  Location: [facility];  Service: Podiatry;  Laterality: Right;  Right Keller arthroplasty with size 5 silicone implant right great toe; second metatarsal osteotomy with 2.0x14 screw  . NECK SURGERY    . PERIPHERAL VASCULAR CATHETERIZATION N/A 02/11/2015   Procedure: Carotid Angiography;  Surgeon: Nada Libman, MD;  Location: [facility] INVASIVE CV LAB;  Service: Cardiovascular;  Laterality: N/A;  .  PILONIDAL CYST / SINUS EXCISION    . Rotary cuff    . SPINE SURGERY  09/15/11   Scar tissue removed- back  . SPINE SURGERY  09/15/11   Scar tissue removed- back  . TONSILLECTOMY    . TRACHEOSTOMY TUBE PLACEMENT N/A 03/03/2016   Procedure: TRACHEOSTOMY;  Surgeon: Geanie Logan, MD;  Location: [facility] ORS;  Service: ENT;  Laterality: N/A;    Allergies: Ampicillin; Nortriptyline; Brimonidine; Cyclobenzaprine; Tizanidine; and Gabapentin  Medications: Prior to Admission medications   Medication Sig Start Date End Date Taking? Authorizing Provider  ALPRAZolam (XANAX) 0.5 MG tablet Take 0.25-0.5 mg by mouth every 8 (eight) hours as needed for anxiety.     Historical Provider, MD  aspirin EC 81 MG tablet Take 81 mg by mouth daily.    Historical Provider, MD  baclofen (LIORESAL) 20 MG tablet Take 20 mg by mouth 3 (three) times daily as needed. For muscle spasms. 02/25/15   Historical Provider, MD  budesonide-formoterol (SYMBICORT) 160-4.5 MCG/ACT inhaler Inhale 2 puffs into the lungs 2 (two) times daily.    Historical Provider, MD  diazepam (VALIUM) 2 MG tablet Take 1 tablet (2 mg total) by mouth every 8 (eight) hours as needed for muscle spasms. 02/18/16 02/17/17  Jami L Hagler, PA-C  etodolac (LODINE) 500 MG tablet Take 500 mg by mouth 2 (two) times daily.    Historical Provider, MD  furosemide (LASIX) 40 MG tablet Take 1 tablet (40 mg total) by mouth 2 (two) times daily. 08/12/14   Lonia Blood,  MD  Ipratropium-Albuterol (COMBIVENT RESPIMAT) 20-100 MCG/ACT AERS respimat Inhale 1 puff into the lungs every 6 (six) hours. 08/12/14   Lonia Blood, MD  ipratropium-albuterol (DUONEB) 0.5-2.5 (3) MG/3ML SOLN Take 3 mLs by nebulization every 6 (six) hours. Patient taking differently: Take 3 mLs by nebulization every 6 (six) hours as needed (for wheezing/shortness of breath).  08/12/14   Lonia Blood, MD  ketoconazole (NIZORAL) 2 % shampoo Apply 1 application topically 2 (two) times a week. Leave on 5  minutes then rinse well. 10/06/15   Historical Provider, MD  levothyroxine (SYNTHROID, LEVOTHROID) 125 MCG tablet Take 1 tablet (125 mcg total) by mouth daily before breakfast. 08/12/14   Lonia Blood, MD  lisinopril (PRINIVIL,ZESTRIL) 10 MG tablet Take 10 mg by mouth daily.    Historical Provider, MD  mirtazapine (REMERON) 15 MG tablet Take 15 mg by mouth at bedtime.    Historical Provider, MD  Multiple Vitamin (MULTIVITAMIN WITH MINERALS) TABS tablet Take 1 tablet by mouth daily.    Historical Provider, MD  omeprazole (PRILOSEC) 20 MG capsule Take 20 mg by mouth daily.    Historical Provider, MD  oxyCODONE (ROXICODONE) 5 MG immediate release tablet Take 1 tablet (5 mg total) by mouth every 8 (eight) hours as needed. 04/23/15 04/22/16  Chinita Pester, FNP  sertraline (ZOLOFT) 100 MG tablet Take 100 mg by mouth daily.    Historical Provider, MD     Family History  Problem Relation Age of Onset  . Cancer Mother     Stomach  . Hypertension Brother   . Heart attack Father     Social History   Social History  . Marital status: Single    Spouse name: N/A  . Number of children: N/A  . Years of education: N/A   Social History Main Topics  . Smoking status: Former Smoker    Quit date: 08/30/2006  . Smokeless tobacco: Never Used  . Alcohol use 1.2 oz/week    2 Cans of beer per week     Comment: occasional use  . Drug use: No  . Sexual activity: Not on file   Other Topics Concern  . Not on file   Social History Narrative  . No narrative on file    Review of Systems: A 12 point ROS discussed and pertinent positives are indicated in the HPI above.  All other systems are negative.  Review of Systems  Constitutional: Positive for activity change, appetite change and fatigue. Negative for fever.  Neurological: Positive for weakness.  Psychiatric/Behavioral: Positive for confusion.    Vital Signs: There were no vitals taken for this visit.  Physical Exam  Cardiovascular:  Normal rate and regular rhythm.   Pulmonary/Chest: He is in respiratory distress.  trach  Abdominal: Soft. Bowel sounds are normal.  Musculoskeletal: Normal range of motion.  Neurological: He is alert.  Skin: Skin is warm.  Psychiatric:  Need consent from family Pt unable to effectively communicate well --on trach  Nursing note and vitals reviewed.   Mallampati Score:  MD Evaluation Airway: WNL Heart: WNL Abdomen: WNL Chest/ Lungs: WNL ASA  Classification: 3 Mallampati/Airway Score: Three  Imaging: Dg Forearm Left  Result Date: 03/03/2016 CLINICAL DATA:  Found unresponsive at home with bruising, probable fall EXAM: LEFT FOREARM - 2 VIEW COMPARISON:  None. FINDINGS: The left radius and ulna are intact and normally aligned. No acute fracture is seen. The left elbow joint is unremarkable on the images obtained. IMPRESSION: No fracture. Electronically  Signed   By: Dwyane Dee M.D.   On: 03/03/2016 16:26   Dg Forearm Right  Result Date: 03/03/2016 CLINICAL DATA:  Unresponsive, multiple bruises EXAM: RIGHT FOREARM - 2 VIEW COMPARISON:  None. FINDINGS: No fracture or dislocation is seen. The joint spaces are preserved. Visualized soft tissues are within normal limits. IMPRESSION: No fracture or dislocation is seen. Electronically Signed   By: Charline Bills M.D.   On: 03/03/2016 16:25   Dg Tibia/fibula Left  Result Date: 03/03/2016 CLINICAL DATA:  Found unresponsive on floor at home, probable fall EXAM: LEFT TIBIA AND FIBULA - 2 VIEW COMPARISON:  None. FINDINGS: Views of the left tibia and fibula were obtained. There does appear to be an abnormal bony density adjacent to the distal left fibula which may represent fracture. Views of the left ankle may be helpful to evaluate further. The ankle joint appears normal. IMPRESSION: Suspect fracture of the distal left fibula. Consider left ankle views. Electronically Signed   By: Dwyane Dee M.D.   On: 03/03/2016 16:34   Dg Tibia/fibula  Right  Result Date: 03/03/2016 CLINICAL DATA:  Found unresponsive on the floor at home, probable fall EXAM: RIGHT TIBIA AND FIBULA - 2 VIEW COMPARISON:  None. FINDINGS: Views of the left tibia and fibula show no abnormality. No fracture is seen. Alignment is normal. IMPRESSION: Negative. Electronically Signed   By: Dwyane Dee M.D.   On: 03/03/2016 16:35   Dg Abd 1 View  Result Date: 03/08/2016 CLINICAL DATA:  Soft feeding tube placement. EXAM: ABDOMEN - 1 VIEW COMPARISON:  02/18/2016 FINDINGS: Soft feeding tube has its tip in the midportion of the stomach. Gas pattern unremarkable. Right hemidiaphragm elevated. IMPRESSION: Soft feeding tube in the gastric body. Electronically Signed   By: Paulina Fusi M.D.   On: 03/08/2016 15:22   Ct Head Wo Contrast  Result Date: 03/14/2016 CLINICAL DATA:  70 year old male with fall and trauma to the left side of the forehead. EXAM: CT HEAD WITHOUT CONTRAST TECHNIQUE: Contiguous axial images were obtained from the base of the skull through the vertex without intravenous contrast. COMPARISON:  Head CT dated 03/03/2016 FINDINGS: Evaluation is limited due to streak artifact caused by metallic hardware in the suboccipital region. Brain: The ventricles and sulci are appropriate in size for patient's age. Mild periventricular and deep white matter chronic microvascular ischemic changes noted. There is an ill-defined 2.2 x 1.3 cm focal area of hypodensity in the right frontal periventricular white matter which appears more conspicuous compared to the prior study and may be related to chronic ischemic changes, however; an acute or subacute ischemia is not excluded. MRI is recommended if there is clinical concern for acute infarct. There is no acute intracranial hemorrhage. No mass effect or midline shift noted. There are small scattered cortical calcifications as seen on the prior study likely sequela of chronic insult. Vascular: No hyperdense vessel or unexpected calcification.  Skull: Suboccipital and upper cervical orthopedic hardware. No acute calvarial pathology. Sinuses/Orbits: There is mild mucoperiosteal thickening of the ethmoid air cells and frontal sinuses. No air-fluid levels. There is diffuse opacification of the mastoid air cells bilaterally. Other: Small left forehead scalp laceration. IMPRESSION: No acute intracranial hemorrhage. Mild age-related atrophy and chronic microvascular ischemic changes. Ill-defined focal hypodensity in the right frontal periventricular white matter appears more conspicuous compared to the prior study. Further evaluation with MRI is recommended if there is clinical concern for acute infarct. Electronically Signed   By: Ceasar Mons.D.  On: 03/14/2016 22:47   Ct Head Wo Contrast  Result Date: 03/03/2016 CLINICAL DATA:  Found unresponsive at home, unknown duration of time down, fall, hypertension, COPD, former smoker, dilated cardiomyopathy EXAM: CT HEAD WITHOUT CONTRAST CT CERVICAL SPINE WITHOUT CONTRAST TECHNIQUE: Multidetector CT imaging of the head and cervical spine was performed following the standard protocol without intravenous contrast. Multiplanar CT image reconstructions of the cervical spine were also generated. COMPARISON:  CT head and cervical spine 02/27/2016 FINDINGS: CT HEAD FINDINGS Minimal atrophy. Normal ventricular morphology. No midline shift or mass effect. Small vessel chronic ischemic changes of deep cerebral white matter. Scattered parenchymal calcifications. No intracranial hemorrhage, mass lesion, or evidence acute infarction. No extra-axial fluid collections. Atherosclerotic calcification of LEFT vertebral and BILATERAL internal carotid arteries at skullbase. Scattered mucosal thickening in the paranasal sinuses with tiny amounts of fluid dependently in the maxillary sinuses. No calvarial fractures. CT CERVICAL SPINE FINDINGS Atherosclerotic calcification of internal carotid and vertebral arteries. Large area of  opacity at the RIGHT apex consistent with pneumonia or aspiration since no opacity/mass was identified on prior/recent CT. Endotracheal tube at clavicular heads, above carina. Beam hardening artifacts of dental origin and related to orthopedic hardware at the upper cervical spine. Diffuse osseous demineralization. Prior posterior occiputo-cervical fusion extending from occiput to T-2. Hardware intact. Degenerative disc disease with disc space narrowing and endplate spur formation at C3-C4 and C4-C5. Vertebral body heights maintained without fracture or subluxation. Odontoid appears intact. Prior decompression of the spinal canal with resection of the posterior elements at C1 through C6. IMPRESSION: Mild atrophy with small vessel chronic ischemic changes of deep cerebral white matter. No acute intracranial abnormalities. Prior occipitocervical fusion extending from occipital bone to T-2. Prior posterior spinal decompression of C1 through C6. No acute cervical spine abnormalities. Large area of new opacification at the RIGHT apex question pneumonia versus aspiration. Electronically Signed   By: Ulyses Southward M.D.   On: 03/03/2016 16:50   Ct Chest Wo Contrast  Result Date: 03/23/2016 CLINICAL DATA:  Respiratory distress.  Abnormal chest radiography. EXAM: CT CHEST WITHOUT CONTRAST TECHNIQUE: Multidetector CT imaging of the chest was performed following the standard protocol without IV contrast. COMPARISON:  Chest radiography same day.  Chest CT 03/03/2016. FINDINGS: Cardiovascular: Coronary artery and aortic atherosclerosis. Mediastinum/Nodes: No worrisome lymph nodes. No masses. Tracheostomy grossly well positioned. Nasogastric tube enters the abdomen. Lungs/Pleura: Right hemidiaphragm is elevated. No pleural fluid on the left. Minimal hazy infiltrate present in the apex of the left upper lobe. The right chest shows a small effusion largely layering dependently. There is extensive airspace filling in the upper lobe  consistent with bronchopneumonia. There is patchy density in the right lower lobe in the right middle lobe that could be atelectasis or lesser bronchopneumonia. The appearance is improved since the CT of 03/03/2016. Upper Abdomen: Calcified gallstones as seen previously. Musculoskeletal: No significant bone finding. IMPRESSION: Improved on the right since 03/03/2016. Elevated right hemidiaphragm. Small effusion on the right layering dependently. Infiltrate in the right upper lobe consistent with bronchopneumonia. Lesser infiltrate/ atelectasis in the right lower lobe and right middle lobe. Small area patchy airspace filling at the left apex consistent with bronchopneumonia. This left apical infiltrate is actually slightly worsened when compared to the previous CT. Electronically Signed   By: Paulina Fusi M.D.   On: 03/23/2016 14:23   Ct Chest W Contrast  Result Date: 03/03/2016 CLINICAL DATA:  Found unresponsive at home. Time down unknown. No one with pt to provide EXAM: CT  CHEST, ABDOMEN, AND PELVIS WITH CONTRAST TECHNIQUE: Multidetector CT imaging of the chest, abdomen and pelvis was performed following the standard protocol during bolus administration of intravenous contrast. CONTRAST:  ISOVUE-300 IOPAMIDOL (ISOVUE-300) INJECTION 61% COMPARISON:  CT 04/16/2015 FINDINGS: CT CHEST FINDINGS Cardiovascular: Coronary artery calcification and aortic atherosclerotic calcification. Mediastinum/Nodes: No axillary or supraclavicular lymphadenopathy. Small a gas in the soft tissues of the thoracic inlet adjacent to strap muscles (image 5, series 2 and related to recent tracheostomy. Tracheostomy tube in good position. No mediastinal lymphadenopathy. No pericardial fluid. Esophagus normal. Lungs/Pleura: Dense consolidation within the RIGHT lower lobe and patchy consolidation RIGHT middle lobe and RIGHT upper lobe. Small effusion extends along the oblique fissure in the RIGHT hemi thorax. The RIGHT hemidiaphragm is  elevated with a loop of colon beneath the RIGHT hemidiaphragm. The LEFT lung is relatively clear. The RIGHT lower lobe bronchus is difficult to follow. The bronchus intermedius is patent. The RIGHT middle lobe and RIGHT upper lobe bronchi are patent. Trachea normal. There is a tracheostomy tube in place. Musculoskeletal: No acute finding CT ABDOMEN PELVIS FINDINGS Hepatobiliary: No focal hepatic lesion. Large gallstone within lumen gallbladder without gallbladder distension. There is small amount of pericholecystic fluid. Pancreas: Normal pancreas Spleen: Normal spleen Adrenals/Urinary Tract: Adrenal glands and kidneys are normal. Ureters normal. Foley catheter in the bladder. Stomach/Bowel: Stomach, small bowel, appendix, cecum normal. The colon and rectosigmoid colon are normal. Vascular/Lymphatic: Abdominal aortic stent graft in place and well opacified. Reproductive: Prostate small Other: No adenopathy in the abdomen pelvis. Musculoskeletal: No aggressive osseous lesion. Posterior lumbar fusion noted IMPRESSION: 1. Dense consolidation within the RIGHT lower lobe and patchy consolidation in the RIGHT upper lobe. Differential includes multi focal lobar pneumonia versus mucous plugging versus a combination of both. Cannot exclude bronchogenic carcinoma but less favored. Patient may benefit from bronchoscopy. 2. RIGHT hemidiaphragm is significantly elevated. 3. Small amount fluid around the gallbladder with gallstones. Findings could represent acute or chronic cholecystitis. 4. Abdominal aortic stent graft in place with good opacification. Electronically Signed   By: Genevive Bi M.D.   On: 03/03/2016 16:51   Ct Cervical Spine Wo Contrast  Result Date: 03/03/2016 CLINICAL DATA:  Found unresponsive at home, unknown duration of time down, fall, hypertension, COPD, former smoker, dilated cardiomyopathy EXAM: CT HEAD WITHOUT CONTRAST CT CERVICAL SPINE WITHOUT CONTRAST TECHNIQUE: Multidetector CT imaging of the  head and cervical spine was performed following the standard protocol without intravenous contrast. Multiplanar CT image reconstructions of the cervical spine were also generated. COMPARISON:  CT head and cervical spine 02/27/2016 FINDINGS: CT HEAD FINDINGS Minimal atrophy. Normal ventricular morphology. No midline shift or mass effect. Small vessel chronic ischemic changes of deep cerebral white matter. Scattered parenchymal calcifications. No intracranial hemorrhage, mass lesion, or evidence acute infarction. No extra-axial fluid collections. Atherosclerotic calcification of LEFT vertebral and BILATERAL internal carotid arteries at skullbase. Scattered mucosal thickening in the paranasal sinuses with tiny amounts of fluid dependently in the maxillary sinuses. No calvarial fractures. CT CERVICAL SPINE FINDINGS Atherosclerotic calcification of internal carotid and vertebral arteries. Large area of opacity at the RIGHT apex consistent with pneumonia or aspiration since no opacity/mass was identified on prior/recent CT. Endotracheal tube at clavicular heads, above carina. Beam hardening artifacts of dental origin and related to orthopedic hardware at the upper cervical spine. Diffuse osseous demineralization. Prior posterior occiputo-cervical fusion extending from occiput to T-2. Hardware intact. Degenerative disc disease with disc space narrowing and endplate spur formation at C3-C4 and C4-C5. Vertebral body  heights maintained without fracture or subluxation. Odontoid appears intact. Prior decompression of the spinal canal with resection of the posterior elements at C1 through C6. IMPRESSION: Mild atrophy with small vessel chronic ischemic changes of deep cerebral white matter. No acute intracranial abnormalities. Prior occipitocervical fusion extending from occipital bone to T-2. Prior posterior spinal decompression of C1 through C6. No acute cervical spine abnormalities. Large area of new opacification at the RIGHT  apex question pneumonia versus aspiration. Electronically Signed   By: Ulyses Southward M.D.   On: 03/03/2016 16:50   Ct Abdomen Pelvis W Contrast  Result Date: 03/03/2016 CLINICAL DATA:  Found unresponsive at home. Time down unknown. No one with pt to provide EXAM: CT CHEST, ABDOMEN, AND PELVIS WITH CONTRAST TECHNIQUE: Multidetector CT imaging of the chest, abdomen and pelvis was performed following the standard protocol during bolus administration of intravenous contrast. CONTRAST:  ISOVUE-300 IOPAMIDOL (ISOVUE-300) INJECTION 61% COMPARISON:  CT 04/16/2015 FINDINGS: CT CHEST FINDINGS Cardiovascular: Coronary artery calcification and aortic atherosclerotic calcification. Mediastinum/Nodes: No axillary or supraclavicular lymphadenopathy. Small a gas in the soft tissues of the thoracic inlet adjacent to strap muscles (image 5, series 2 and related to recent tracheostomy. Tracheostomy tube in good position. No mediastinal lymphadenopathy. No pericardial fluid. Esophagus normal. Lungs/Pleura: Dense consolidation within the RIGHT lower lobe and patchy consolidation RIGHT middle lobe and RIGHT upper lobe. Small effusion extends along the oblique fissure in the RIGHT hemi thorax. The RIGHT hemidiaphragm is elevated with a loop of colon beneath the RIGHT hemidiaphragm. The LEFT lung is relatively clear. The RIGHT lower lobe bronchus is difficult to follow. The bronchus intermedius is patent. The RIGHT middle lobe and RIGHT upper lobe bronchi are patent. Trachea normal. There is a tracheostomy tube in place. Musculoskeletal: No acute finding CT ABDOMEN PELVIS FINDINGS Hepatobiliary: No focal hepatic lesion. Large gallstone within lumen gallbladder without gallbladder distension. There is small amount of pericholecystic fluid. Pancreas: Normal pancreas Spleen: Normal spleen Adrenals/Urinary Tract: Adrenal glands and kidneys are normal. Ureters normal. Foley catheter in the bladder. Stomach/Bowel: Stomach, small bowel,  appendix, cecum normal. The colon and rectosigmoid colon are normal. Vascular/Lymphatic: Abdominal aortic stent graft in place and well opacified. Reproductive: Prostate small Other: No adenopathy in the abdomen pelvis. Musculoskeletal: No aggressive osseous lesion. Posterior lumbar fusion noted IMPRESSION: 1. Dense consolidation within the RIGHT lower lobe and patchy consolidation in the RIGHT upper lobe. Differential includes multi focal lobar pneumonia versus mucous plugging versus a combination of both. Cannot exclude bronchogenic carcinoma but less favored. Patient may benefit from bronchoscopy. 2. RIGHT hemidiaphragm is significantly elevated. 3. Small amount fluid around the gallbladder with gallstones. Findings could represent acute or chronic cholecystitis. 4. Abdominal aortic stent graft in place with good opacification. Electronically Signed   By: Genevive Bi M.D.   On: 03/03/2016 16:51   Dg Chest Port 1 View  Result Date: 03/23/2016 CLINICAL DATA:  Respiratory failure.  Pneumonia. EXAM: PORTABLE CHEST 1 VIEW COMPARISON:  03/19/2016, 03/07/2016, 10/28/2014.  CT 03/03/2016 FINDINGS: Tracheostomy tube and NG tube in stable position. Stable cardiomegaly. Normal pulmonary vascularity. Persistent right perihilar infiltrate. Slight progression of right upper lobe infiltrate. Low lung volumes with right base atelectasis and or infiltrate. No prominent pleural effusion or pneumothorax. Prior cervical thoracic spine fusion . IMPRESSION: 1. Tracheostomy tube and NG tube in stable position . 2. Persistent right perihilar infiltrate. Slight progression of right upper lobe infiltrate. Persistent low lung volumes with right base atelectasis and or infiltrate. 3. Stable cardiomegaly.  No  pulmonary venous congestion. Electronically Signed   By: Maisie Fus  Register   On: 03/23/2016 07:27   Dg Chest Port 1 View  Result Date: 03/19/2016 CLINICAL DATA:  Respiratory failure EXAM: PORTABLE CHEST 1 VIEW COMPARISON:   March 15, 2016 FINDINGS: Tracheostomy catheter tip is 6.5 cm above the carina. Nasogastric tube tip and side port are below the diaphragm. Central catheter no longer appreciable. No pneumothorax. There is persistent elevation of the right hemidiaphragm. There is underlying interstitial edema. There has been significant partial clearing of airspace consolidation throughout much of the right lung compared to most recent study. No new opacity is evident. Heart is mildly enlarged with pulmonary vascularity stable and felt to be within normal limits. There is atherosclerotic calcification in the aorta. No adenopathy evident. There is postoperative change in the cervical spine region. IMPRESSION: Significant partial clearing of airspace consolidation on the right, with patchy residual airspace opacity. Suspect residual patchy pneumonia on the right. There is a degree of underlying interstitial edema with cardiomegaly, likely underlying congestive heart failure. No new opacity evident. Cardiac silhouette appears stable compared to most recent study. Stable elevation of the right hemidiaphragm. Tube positions as described without evident pneumothorax. Aortic atherosclerosis. Electronically Signed   By: Bretta Bang III M.D.   On: 03/19/2016 07:28   Dg Chest Port 1 View  Result Date: 03/15/2016 CLINICAL DATA:  Respiratory failure, pneumonia, history of COPD EXAM: PORTABLE CHEST 1 VIEW COMPARISON:  Portable chest x-ray of March 09, 2016 FINDINGS: A tracheostomy tube is in place with the tip projecting at the level of the clavicular heads. The left lung is well-expanded. The interstitial markings are mildly increased on the left. On the right there is volume loss with elevation of the hemidiaphragm. Confluent alveolar opacities have developed in the aerated portion of the right lung. The cardiac silhouette remains enlarged. There is calcification in the wall of the aortic arch. The pulmonary vascularity is not  clearly engorged. The right-sided PICC line tip projects over the midportion of the SVC. The feeding tube tip cannot be clearly discerned but appears to lie below the hemidiaphragm. IMPRESSION: Increased volume loss and worsening of alveolar opacities on the right consistent with pneumonia. Mild interstitial prominence on the left has developed and may reflect early changes of pneumonia or less likely interstitial edema. No definite pulmonary vascular congestion. The support tubes are in reasonable position. Electronically Signed   By: [name]  [place] M.D.   On: 03/15/2016 07:55   Dg Chest Port 1 View  Result Date: 03/09/2016 CLINICAL DATA:  Respiratory failure EXAM: PORTABLE CHEST 1 VIEW COMPARISON:  03/07/2016 FINDINGS: Tracheostomy tube tip 2 cm above the carina unchanged. Right lower lobe atelectasis has progressed. Left lower lobe atelectasis unchanged. Right perihilar infiltrate unchanged, possible pneumonia. Small bilateral pleural effusions unchanged. Right arm PICC tip in the right atrium. Feeding tube in place with the tip not visualized. IMPRESSION: Right perihilar infiltrate unchanged, possible pneumonia. Mild progression of right lower lobe atelectasis. Left lower lobe atelectasis unchanged. Support lines unchanged in position. Electronically Signed   By: Marlan Palau M.D.   On: 03/09/2016 07:12   Dg Chest Port 1 View  Result Date: 03/08/2016 CLINICAL DATA:  PICC line placement EXAM: PORTABLE CHEST 1 VIEW COMPARISON:  03/07/2016 FINDINGS: A right PICC catheter is been placed with tip over the cavoatrial junction region. No pneumothorax. Tracheostomy tube with tip measuring 1.9 cm above the carina. Postoperative changes in the cervical spine. Mild cardiac enlargement without vascular congestion. Shallow  inspiration. Small bilateral pleural effusions with basilar atelectasis. Right perihilar infiltration could represent pneumonia or asymmetric edema. Old left rib fractures. IMPRESSION: Right PICC  catheter with tip over the cavoatrial junction region. No pneumothorax. Persistent bilateral pneumothoraces, bibasilar atelectasis, and infiltration or edema in the right perihilar region. Electronically Signed   By: Burman Nieves M.D.   On: 03/08/2016 00:52   Dg Chest Port 1 View  Result Date: 03/07/2016 CLINICAL DATA:  Acute respiratory failure. EXAM: PORTABLE CHEST 1 VIEW COMPARISON:  03/06/2016 and prior exam FINDINGS: The tracheostomy tube is unchanged. Right lung airspace disease and bilateral lower lung atelectasis/airspace disease again noted. There is no evidence of pneumothorax. There has been little interval change since prior study. IMPRESSION: No significant change. Electronically Signed   By: Harmon Pier M.D.   On: 03/07/2016 09:03   Dg Chest Port 1 View  Result Date: 03/06/2016 CLINICAL DATA:  Evaluate tracheostomy catheter. EXAM: PORTABLE CHEST 1 VIEW COMPARISON:  03/05/2016 FINDINGS: Tracheostomy catheter appears unchanged with tip 2.5 cm above carina. Right lung airspace disease/edema and right lower lung consolidation/atelectasis again noted. There is no evidence of pneumothorax. There has been little interval change since the prior study. IMPRESSION: Little significant change as described. Electronically Signed   By: Harmon Pier M.D.   On: 03/06/2016 20:04   Dg Chest Port 1 View  Result Date: 03/05/2016 CLINICAL DATA:  Difficult intubation. EXAM: PORTABLE CHEST 1 VIEW COMPARISON:  03/04/2016 FINDINGS: A tracheostomy catheter is present with tip 2 cm above the carina. Increasing pulmonary vascular congestion/ edema on the right noted. There is no evidence of pneumothorax. Right lower lung consolidation/atelectasis again identified. IMPRESSION: Tracheostomy catheter identified with tip 2 cm above the carina. Increasing right pulmonary vascular congestion/ edema. Continued right lower lung opacity/ consolidation/atelectasis. Electronically Signed   By: Harmon Pier M.D.   On:  03/05/2016 17:37   Dg Chest Port 1 View  Result Date: 03/04/2016 CLINICAL DATA:  Respiratory failure EXAM: PORTABLE CHEST 1 VIEW COMPARISON:  02/27/2016 FINDINGS: Tracheostomy appliance is in the midline. Stable elevation of the right hemidiaphragm. Airspace consolidation in the bases. No large effusion. No pneumothorax. IMPRESSION: Consolidation in the bases, right greater than left, probably unchanged from the recent CT. Electronically Signed   By: Ellery Plunk M.D.   On: 03/04/2016 03:13   Dg Abd Portable 1v  Result Date: 03/26/2016 CLINICAL DATA:  Nasogastric tube placement. EXAM: PORTABLE ABDOMEN - 1 VIEW COMPARISON:  Radiograph of March 15, 2016. FINDINGS: Nasogastric tube tip is seen in expected position of proximal stomach. No abnormal bowel dilatation is noted. Moderate amount of stool is noted in the colon. Status post stent graft repair of abdominal aortic aneurysm. IMPRESSION: Nasogastric tube tip seen in expected position of proximal stomach. No evidence of bowel obstruction. Electronically Signed   By: Lupita Raider, M.D.   On: 03/26/2016 07:51   Dg Abd Portable 1v  Result Date: 03/15/2016 CLINICAL DATA:  Post NG tube advancement EXAM: PORTABLE ABDOMEN - 1 VIEW COMPARISON:  03/15/2016 FINDINGS: NG tube with tip in distal stomach. Aorto bi-iliac stent graft is unchanged in position. IMPRESSION: NG tube with tip in distal stomach. Electronically Signed   By: Natasha Mead M.D.   On: 03/15/2016 17:06   Dg Abd Portable 1v  Result Date: 03/15/2016 CLINICAL DATA:  Status post esophagogastric tube placement. EXAM: PORTABLE ABDOMEN - 1 VIEW COMPARISON:  Supine abdominal radiograph of March 15, 2016 at 2:33 p.m. FINDINGS: The radiodense tipped feeding tube is  been removed. A nasogastric tube is been placed. The proximal port lies at the level of the GE junction with the distal port in the gastric cardia. Advancement by at least 10 cm is recommended. There remains elevation of the  right hemidiaphragm. The bowel gas and stool pattern is unremarkable. There is an aorto bi-iliac stent graft in place. The patient has undergone previous posterior fusion at L5-S1. IMPRESSION: Advancement of the nasogastric tube by 10 cm is needed to assure that the proximal port lies below the level of the GE junction. Electronically Signed   By: [name]  [place] M.D.   On: 03/15/2016 14:23   Dg Abd Portable 1v  Result Date: 03/15/2016 CLINICAL DATA:  Nasogastric tube placement.  Initial encounter. EXAM: PORTABLE ABDOMEN - 1 VIEW COMPARISON:  Abdominal radiograph performed 03/14/2016 FINDINGS: The patient's enteric tube is noted ending overlying the antrum of the stomach. The visualized bowel gas pattern is grossly unremarkable. There is elevation of the right hemidiaphragm. The colon is largely filled with air. No free intra-abdominal air is identified, though evaluation for free air is limited on a single supine view. An aortoiliac stent graft is noted. Lumbar spinal fusion hardware is seen. No acute osseous abnormalities are identified. IMPRESSION: Enteric tube noted ending overlying the antrum of the stomach. Electronically Signed   By: Roanna Raider M.D.   On: 03/15/2016 03:33   Dg Abd Portable 1v  Result Date: 03/14/2016 CLINICAL DATA:  70 year old male status post fall and pole on enteric tube. EXAM: PORTABLE ABDOMEN - 1 VIEW COMPARISON:  Radiograph dated 03/09/16 FINDINGS: There has been interval retraction of the enteric tube with tip now overlying the distal esophagus. Recommend advancing the tube into the stomach. There is eventration of the right hemidiaphragm. Air is noted throughout the colon. No bowel dilatation or evidence of obstruction. An aortobifemoral endovascular stent graft noted. Atrophy there degenerative changes of the spine with L4-L5 fixation hardware. No acute fracture. Partially visualized Right lung base densities may represent atelectasis versus infiltrate. IMPRESSION: Interval  retraction of the enteric tube with tip now overlying the distal esophagus. Recommend advancement of the tube into the stomach. Electronically Signed   By: Elgie Collard M.D.   On: 03/14/2016 23:47   Dg Abd Portable 1v  Result Date: 03/09/2016 CLINICAL DATA:  Nasogastric tube placement. EXAM: PORTABLE ABDOMEN - 1 VIEW COMPARISON:  Abdominal radiograph March 08, 2016 FINDINGS: Feeding tube tip projects in distal stomach. Included bowel gas pattern is nondilated and nonobstructive. Aortoiliac endovascular stent. Punctate possible pancreatic calcifications versus vascular calcifications. Lower lumbar PLIF. Consolidation RIGHT lung base as seen on today's chest radiograph. IMPRESSION: Feeding tube tip projects in distal stomach. Electronically Signed   By: Awilda Metro M.D.   On: 03/09/2016 21:41   Dg Humerus Left  Result Date: 03/03/2016 CLINICAL DATA:  Found unresponsive at home on floor. Fall. Trauma. Deformity. Initial encounter. EXAM: LEFT HUMERUS - 2+ VIEW COMPARISON:  None. FINDINGS: Degenerative glenohumeral joint space narrowing and marginal spurring are noted. The humerus appears intact without evidence of fracture. No focal osseous lesion or soft tissue abnormality is seen. IMPRESSION: No evidence of acute osseous abnormality. Electronically Signed   By: Sebastian Ache M.D.   On: 03/03/2016 16:27   Dg Humerus Right  Result Date: 03/03/2016 CLINICAL DATA:  Unresponsive, multiple bruises EXAM: RIGHT HUMERUS - 2+ VIEW COMPARISON:  Right shoulder radiographs dated 04/07/2014 FINDINGS: No fracture or dislocation is seen. The joint spaces are preserved. Visualized soft tissues are within normal  limits. IMPRESSION: No fracture or dislocation is seen. Electronically Signed   By: Charline BillsSriyesh  Krishnan M.D.   On: 03/03/2016 16:24   Dg Femur Min 2 Views Left  Result Date: 03/03/2016 CLINICAL DATA:  Found unresponsive on floor at home, probable fall EXAM: LEFT FEMUR 2 VIEWS COMPARISON:  None.  FINDINGS: Views of the left humerus show no acute fracture. Alignment is normal. There is calcification of the superficial femoral artery present. IMPRESSION: No acute fracture. Electronically Signed   By: Dwyane DeePaul  Barry M.D.   On: 03/03/2016 16:33   Dg Femur, Min 2 Views Right  Result Date: 03/03/2016 CLINICAL DATA:  Found unresponsive at home, probable fall EXAM: RIGHT FEMUR 2 VIEWS COMPARISON:  None. FINDINGS: Views of the right femur show no fracture. Alignment is normal. No significant degenerative change is seen. IMPRESSION: Negative. Electronically Signed   By: Dwyane DeePaul  Barry M.D.   On: 03/03/2016 16:32    Labs:  CBC:  Recent Labs  03/15/16 0914 03/19/16 0609 03/22/16 1144 03/27/16 0507  WBC 9.2 6.9 6.9 5.4  HGB 7.8* 7.8* 8.5* 8.0*  HCT 27.6* 27.3* 30.0* 28.0*  PLT 158 181 174 165    COAGS:  Recent Labs  07/22/15 1439 03/07/16 2333 03/10/16 0917  INR 1.09  --  1.15  APTT 38* 31  --     BMP:  Recent Labs  03/15/16 0914 03/19/16 0609 03/22/16 1144 03/23/16 0528 03/27/16 0507  NA 145 141 140  --  137  K 4.3 4.5 5.9* 4.9 4.3  CL 107 106 101  --  89*  CO2 33* 30 35*  --  44*  GLUCOSE 97 94 118*  --  119*  BUN 28* 24* 29*  --  22*  CALCIUM 8.6* 8.6* 8.8*  --  8.8*  CREATININE 0.91 0.78 0.78  --  0.75  GFRNONAA >60 >60 >60  --  >60  GFRAA >60 >60 >60  --  >60    LIVER FUNCTION TESTS:  Recent Labs  08/13/15 1100 03/03/16 1140 03/10/16 0917 03/15/16 0914  BILITOT 0.9 0.9 0.5 1.0  AST 16 25 18 18   ALT 13* 14* 13* 16*  ALKPHOS 52 42 41 53  PROT 5.6* 5.2* 5.3* 5.1*  ALBUMIN 3.0* 2.7* 2.5* 2.3*    TUMOR MARKERS: No results for input(s): AFPTM, CEA, CA199, CHROMGRNA in the last 8760 hours.  Assessment and Plan:  COPD Resp failure Trach/vent PCM Dysphagia Need for long term care Scheduled for percutaneous gastric tube placement in IR Risks and Benefits discussed with the patient including, but not limited to the need for a barium enema during the  procedure, bleeding, infection, peritonitis, or damage to adjacent structures. All of the patient's questions were answered, patient is agreeable to proceed. Will need to discuss with family---unavailable at this time by phone RN to continue to try to contact   Thank you for this interesting consult.  I greatly enjoyed meeting Chad Huffman and look forward to participating in their care.  A copy of this report was sent to the requesting provider on this date.  Electronically Signed: Fiorela Pelzer A 03/30/2016, 2:52 PM   I spent a total of 40 Minutes    in face to face in clinical consultation, greater than 50% of which was counseling/coordinating care for perc G tube

## 2016-03-31 ENCOUNTER — Institutional Professional Consult (permissible substitution) (HOSPITAL_COMMUNITY): Payer: Self-pay

## 2016-03-31 ENCOUNTER — Other Ambulatory Visit (HOSPITAL_COMMUNITY): Payer: Self-pay

## 2016-03-31 LAB — CBC
HCT: 30.9 % — ABNORMAL LOW (ref 39.0–52.0)
Hemoglobin: 8.9 g/dL — ABNORMAL LOW (ref 13.0–17.0)
MCH: 28.3 pg (ref 26.0–34.0)
MCHC: 28.8 g/dL — ABNORMAL LOW (ref 30.0–36.0)
MCV: 98.4 fL (ref 78.0–100.0)
PLATELETS: 167 10*3/uL (ref 150–400)
RBC: 3.14 MIL/uL — AB (ref 4.22–5.81)
RDW: 19.5 % — ABNORMAL HIGH (ref 11.5–15.5)
WBC: 8.6 10*3/uL (ref 4.0–10.5)

## 2016-03-31 LAB — BASIC METABOLIC PANEL
ANION GAP: 9 (ref 5–15)
BUN: 30 mg/dL — ABNORMAL HIGH (ref 6–20)
CO2: 46 mmol/L — ABNORMAL HIGH (ref 22–32)
Calcium: 8.8 mg/dL — ABNORMAL LOW (ref 8.9–10.3)
Chloride: 83 mmol/L — ABNORMAL LOW (ref 101–111)
Creatinine, Ser: 0.78 mg/dL (ref 0.61–1.24)
GFR calc Af Amer: >60 mL/min (ref >60)
Glucose, Bld: 108 mg/dL — ABNORMAL HIGH (ref 65–99)
POTASSIUM: 4.1 mmol/L (ref 3.5–5.1)
SODIUM: 138 mmol/L (ref 135–145)

## 2016-03-31 LAB — APTT: APTT: 44 s — AB (ref 24–36)

## 2016-03-31 LAB — PROTIME-INR
INR: 1.11
PROTHROMBIN TIME: 14.4 s (ref 11.4–15.2)

## 2016-04-03 LAB — CULTURE, RESPIRATORY: CULTURE: NORMAL

## 2016-04-03 LAB — CULTURE, RESPIRATORY W GRAM STAIN

## 2016-04-04 IMAGING — CR DG LUMBAR SPINE 2-3V
1 series · 3 of 3 positions shown · non-contrast
Comparison: 04/11/2014

CLINICAL DATA: back gave way and he fell today; back pain since; pt
unable to hold completely still; hx back surgery in [REDACTED]

EXAM:
LUMBAR SPINE - 2-3 VIEW

[Series 1: dxr lumbar spine ap and lateral · 0.14mm/px · 3 of 3 slices shown]
[im 1/3]
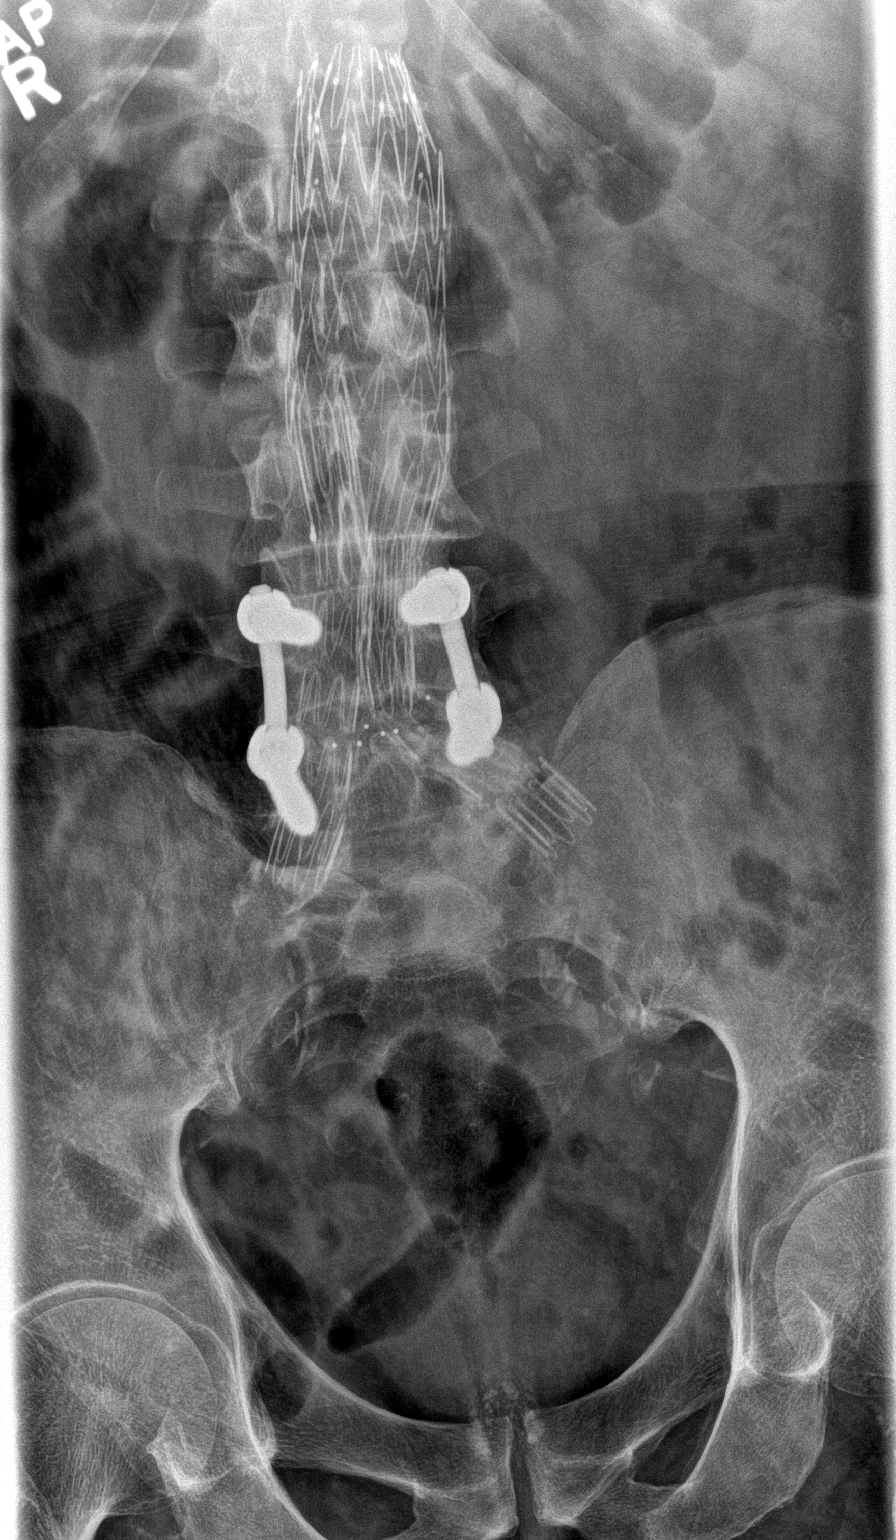
[im 2/3]
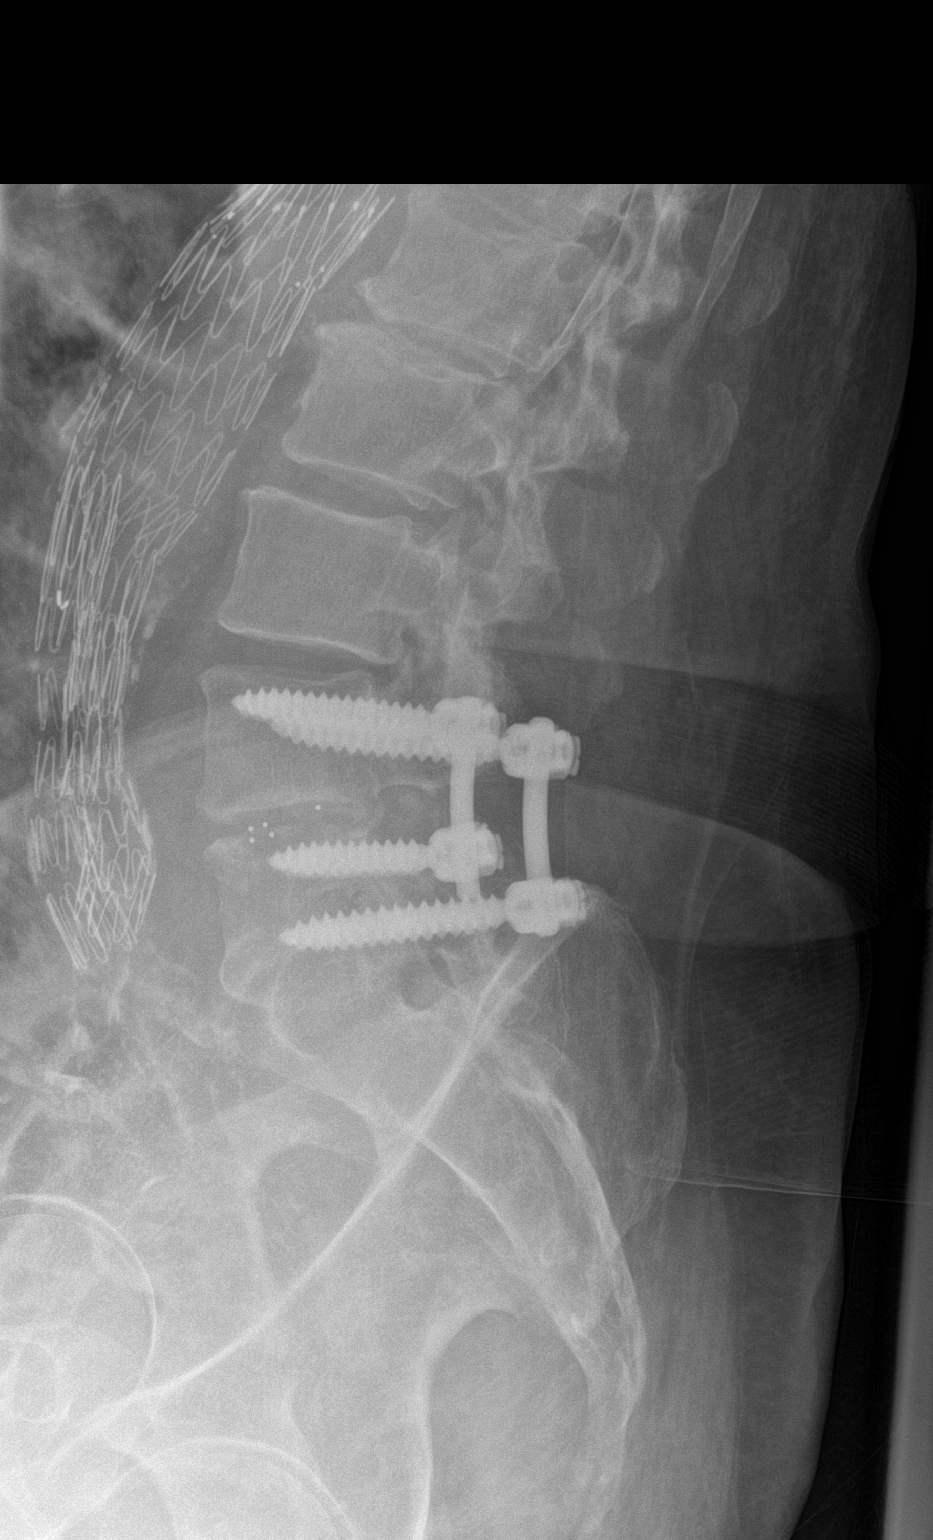
[im 3/3]
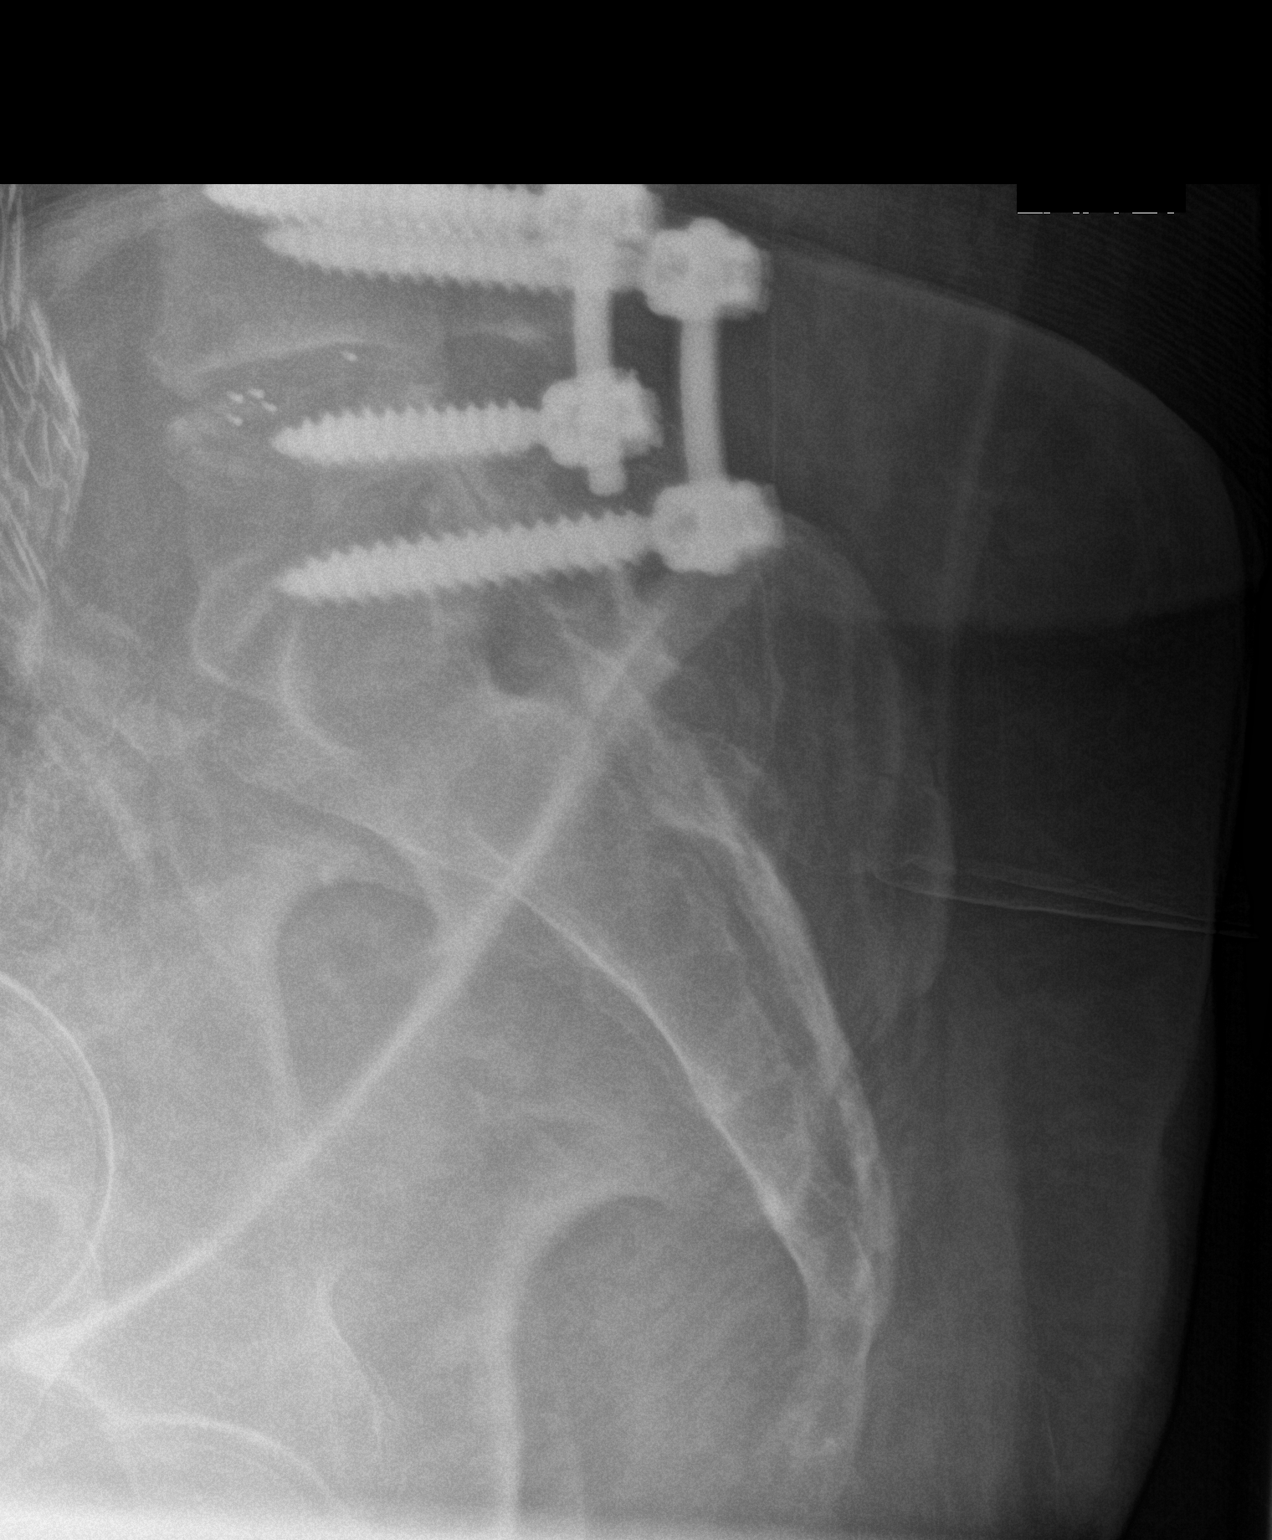

[3 of 3 positions shown; findings below may reference images not displayed]

FINDINGS: Stable changes of L4-5 PLIF, hardware intact without surrounding
lucency. Normal alignment. Mild narrowing of the L1-2 interspace
with small anterior endplate spurs. Bifurcated aortic stent graft
projects in stable location.
IMPRESSION: 1. Stable postoperative changes L4-5. No fracture or other acute
abnormality.

## 2016-04-04 IMAGING — CR DG THORACIC SPINE 2-3V
1 series · 4 of 4 positions shown · non-contrast
Comparison: 04/11/2014 radiographs

CLINICAL DATA: Acute fall with thoracic pain.  Initial encounter.

EXAM:
THORACIC SPINE - 2 VIEW

[Series 1: dxr thoracic  ap and lateral · 0.14mm/px · 4 of 4 slices shown]
[im 1/4]
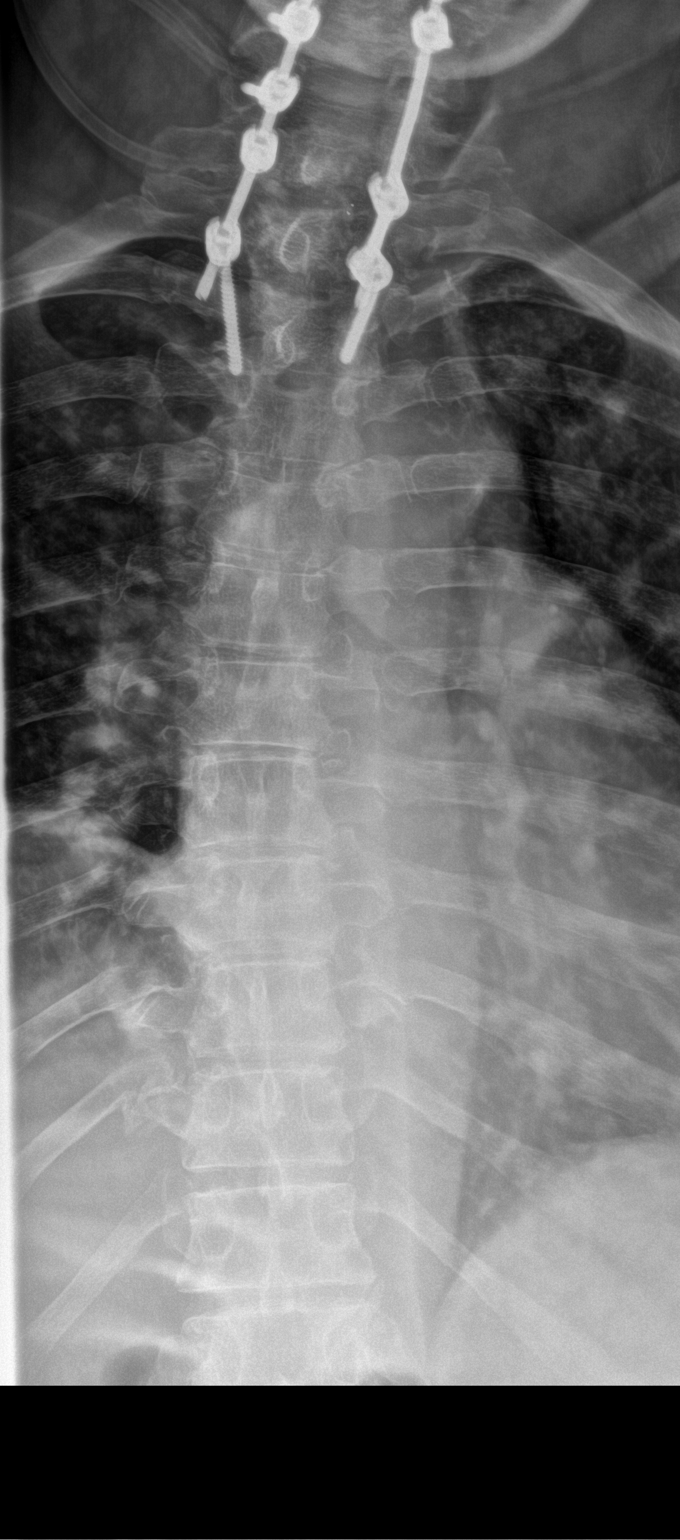
[im 2/4]
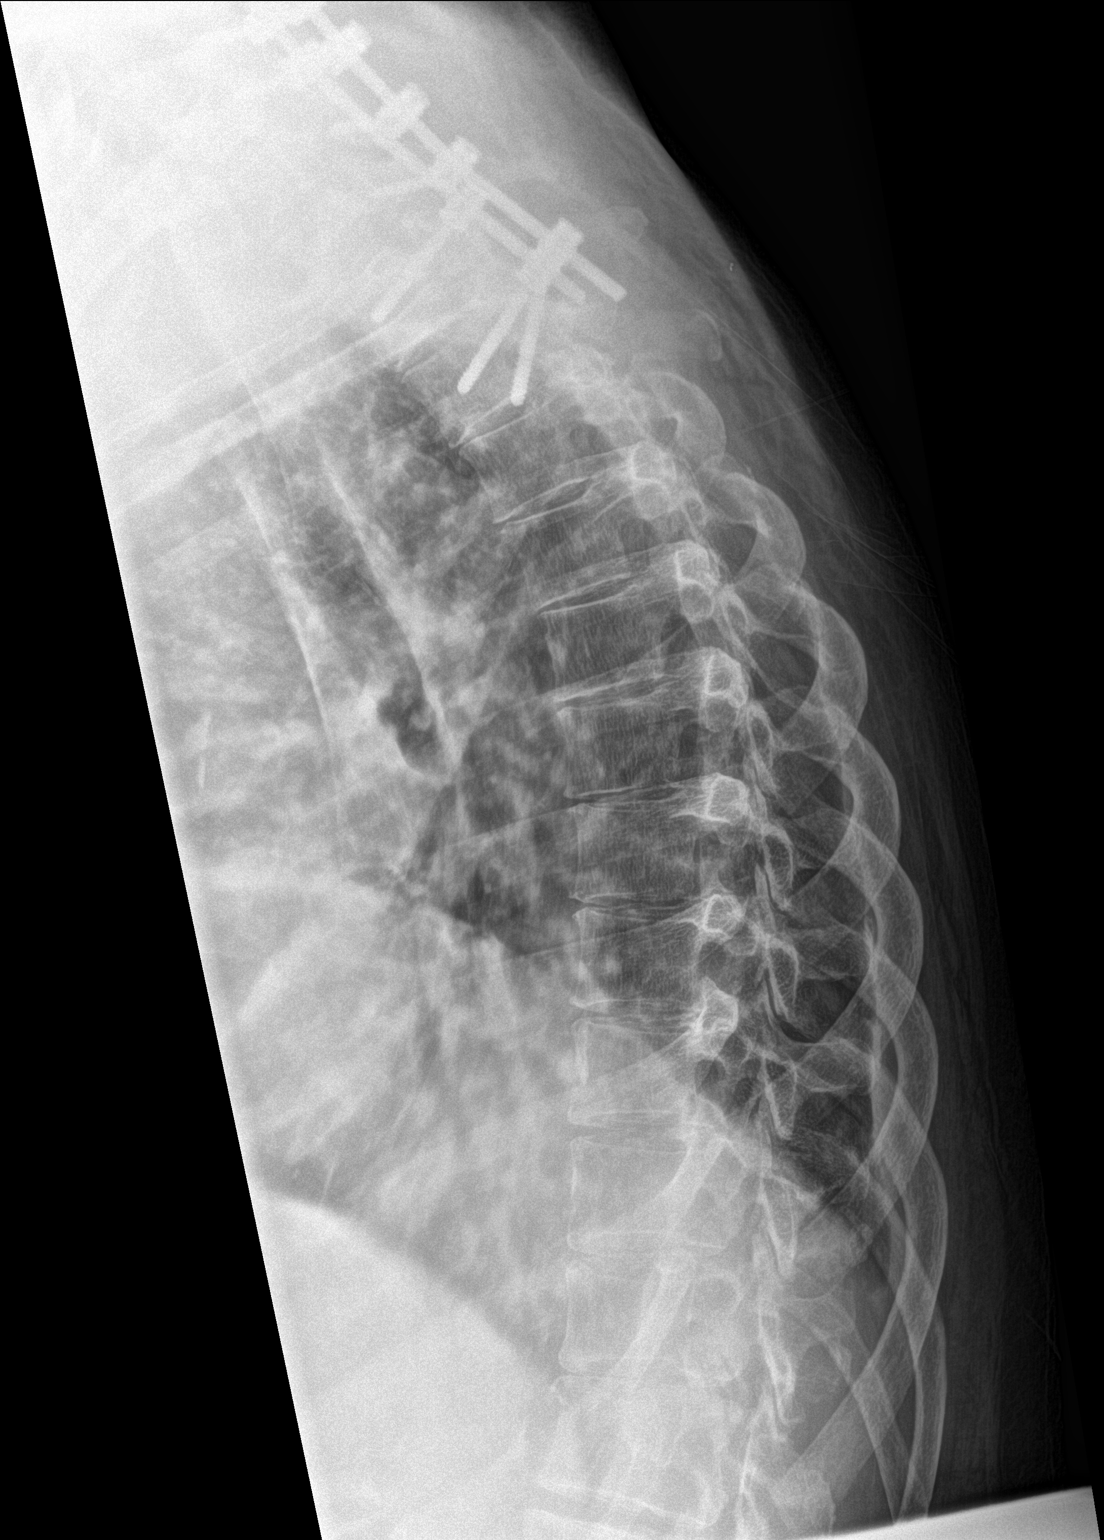
[im 3/4]
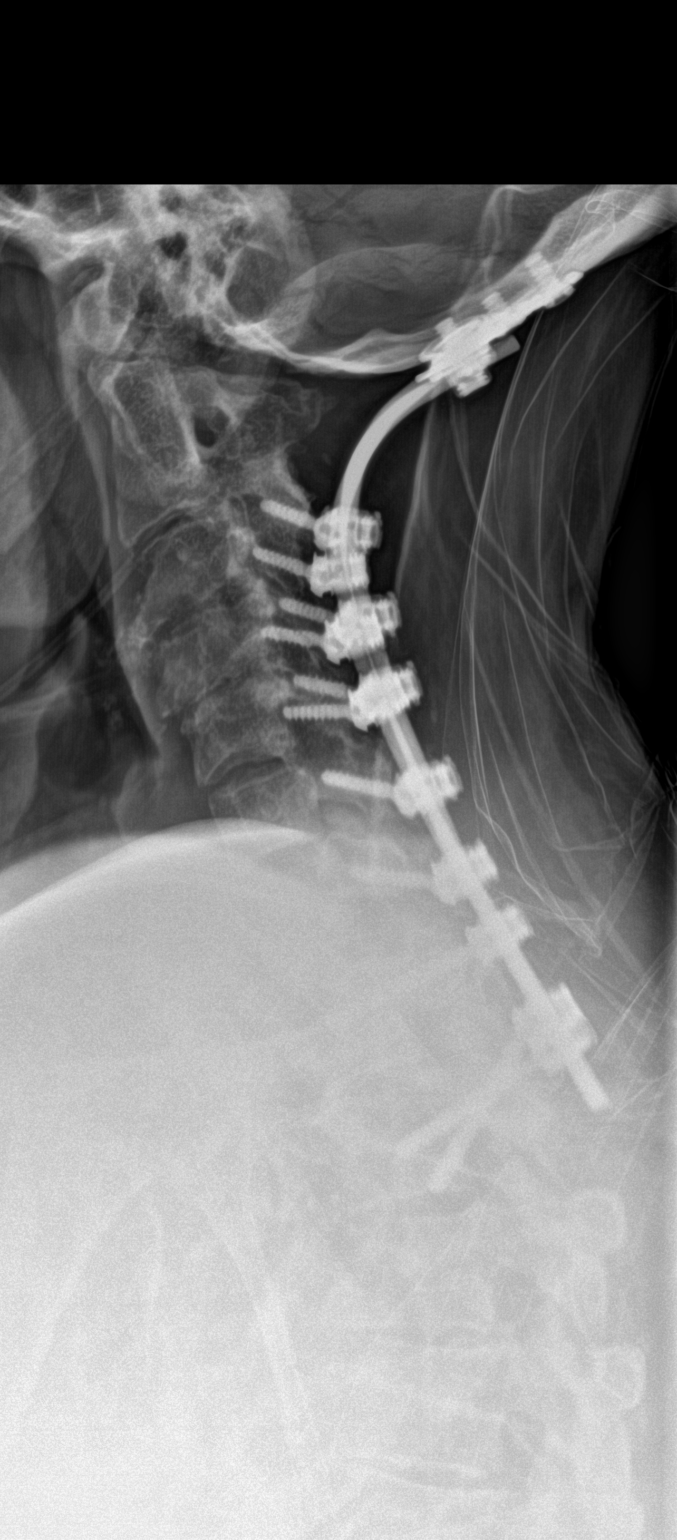
[im 4/4]
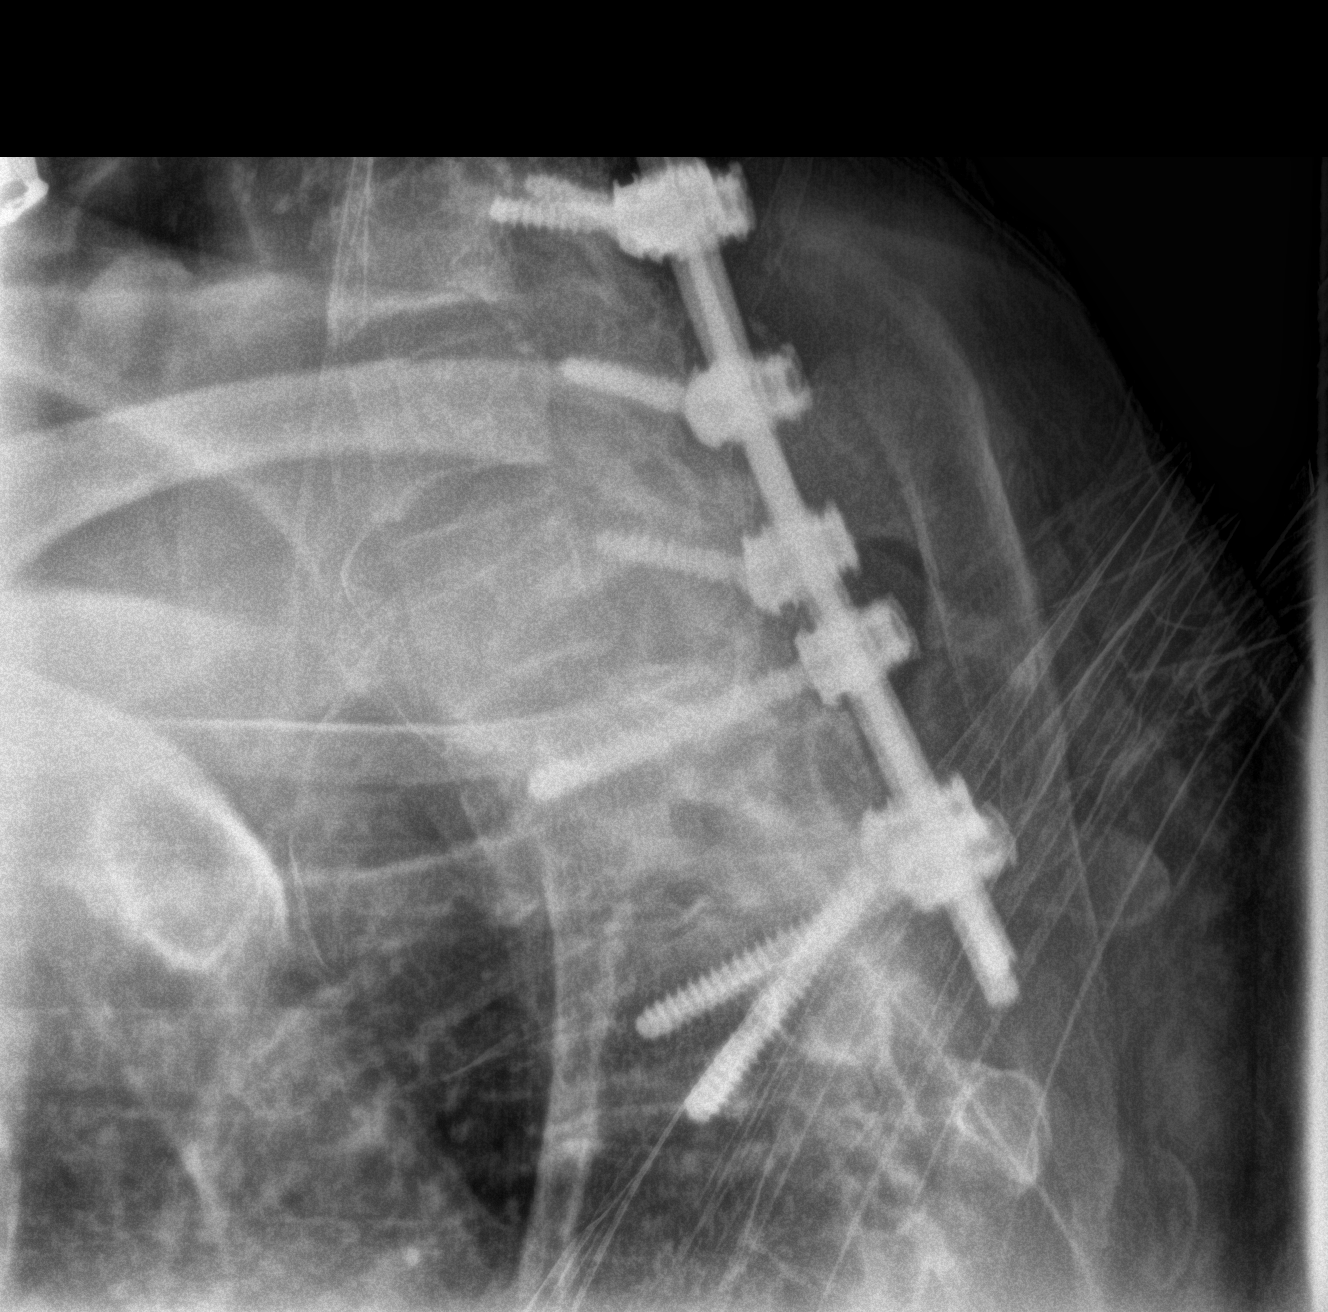

[4 of 4 positions shown; findings below may reference images not displayed]

FINDINGS: A mild apex right thoracic scoliosis is again noted.

There is no evidence of acute bony fracture or subluxation.

Posterior cervicothoracic fusion changes noted. There appears to be
a fracture of the lower most right pedicle screw.

No other acute abnormalities are identified.
IMPRESSION: Question fracture of the lower most right pedicle screw of the
posterior fusion.

No acute bony abnormality.

## 2016-04-04 IMAGING — CR DG CHEST 2V
1 series · 2 of 2 positions shown · non-contrast
Comparison: 05/24/2014

CLINICAL DATA: Shortness of breath.  COPD.  Recent fall.

EXAM:
CHEST  2 VIEW

[Series 1: dxr chest pa (or ap) and lateral · 0.14mm/px · 2 of 2 slices shown]
[im 1/2]
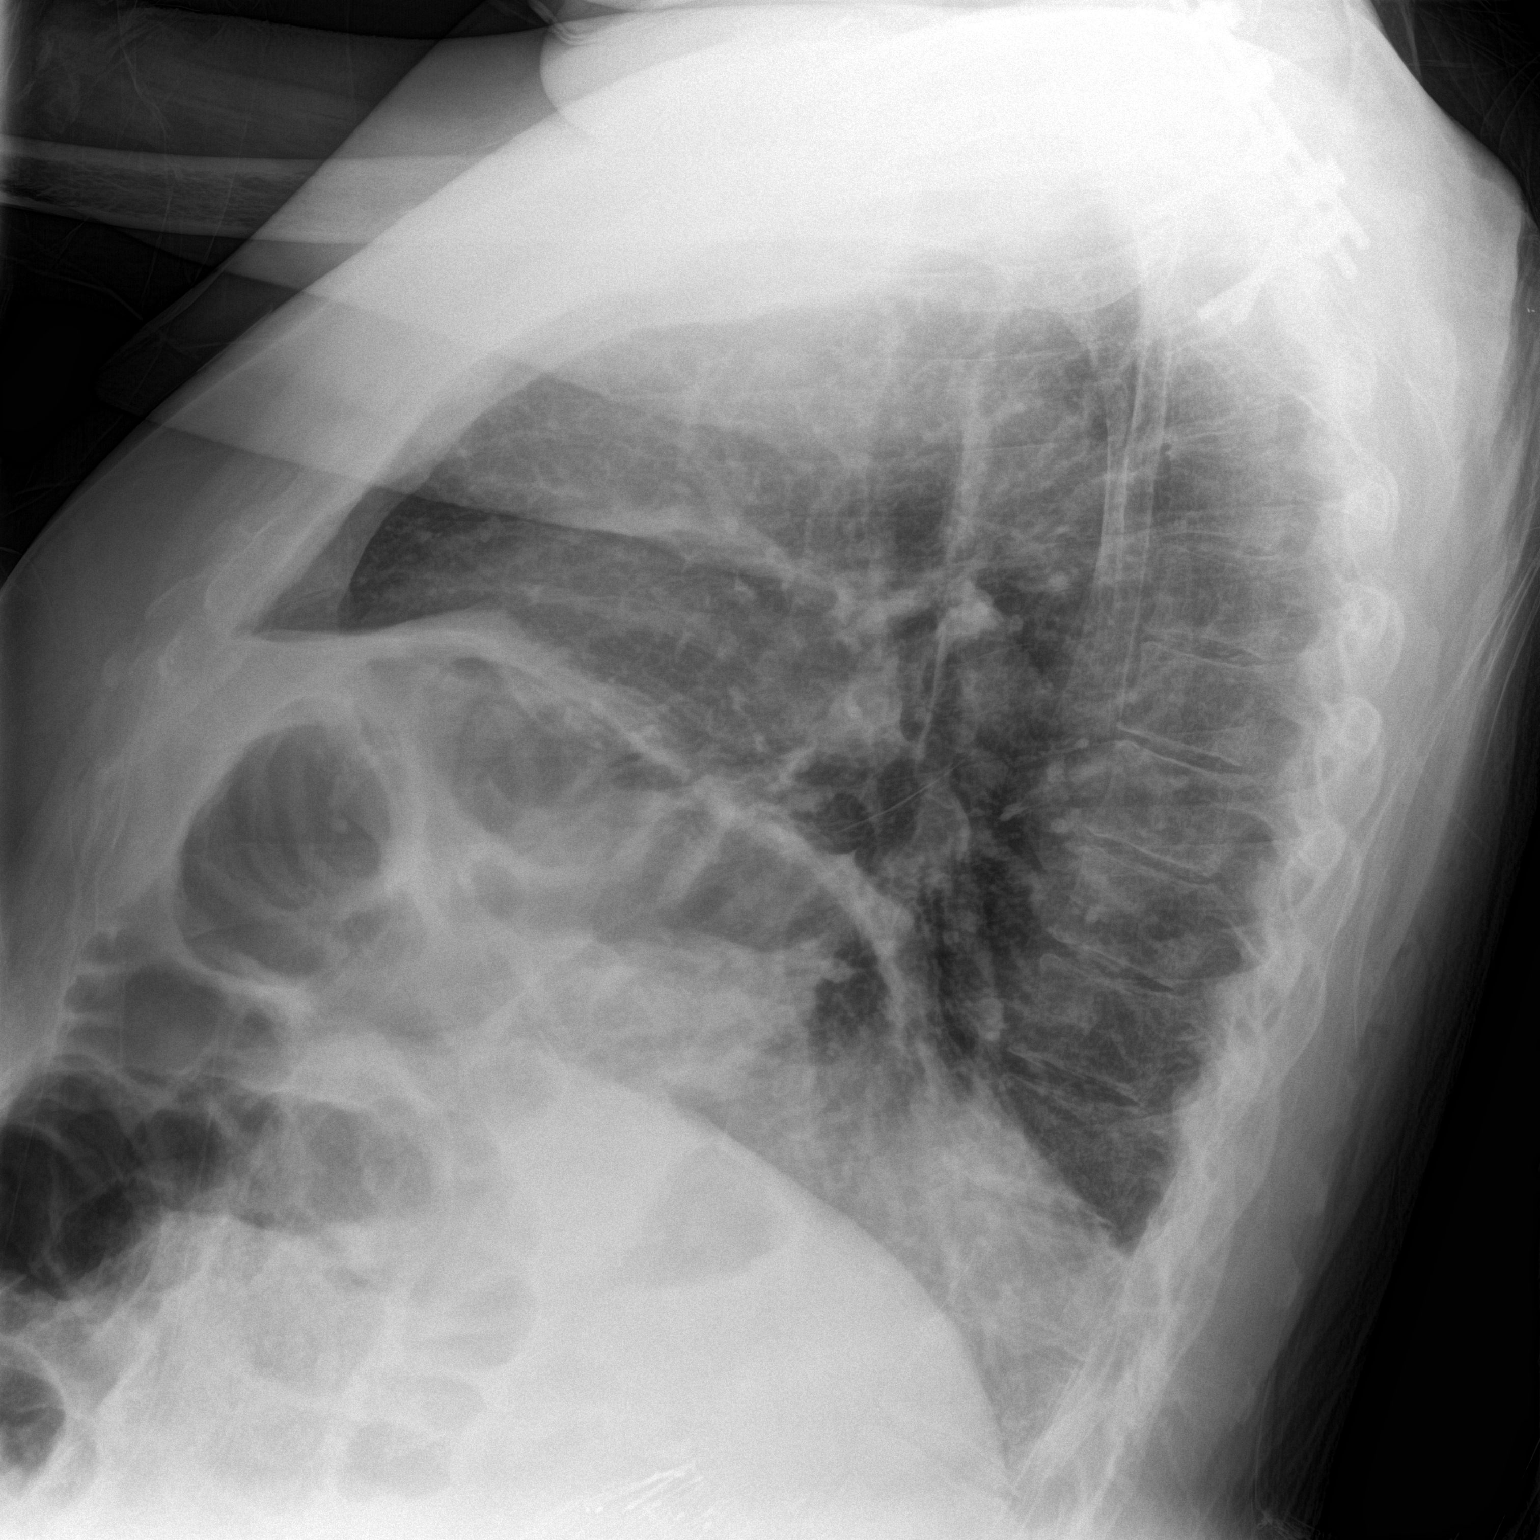
[im 2/2]
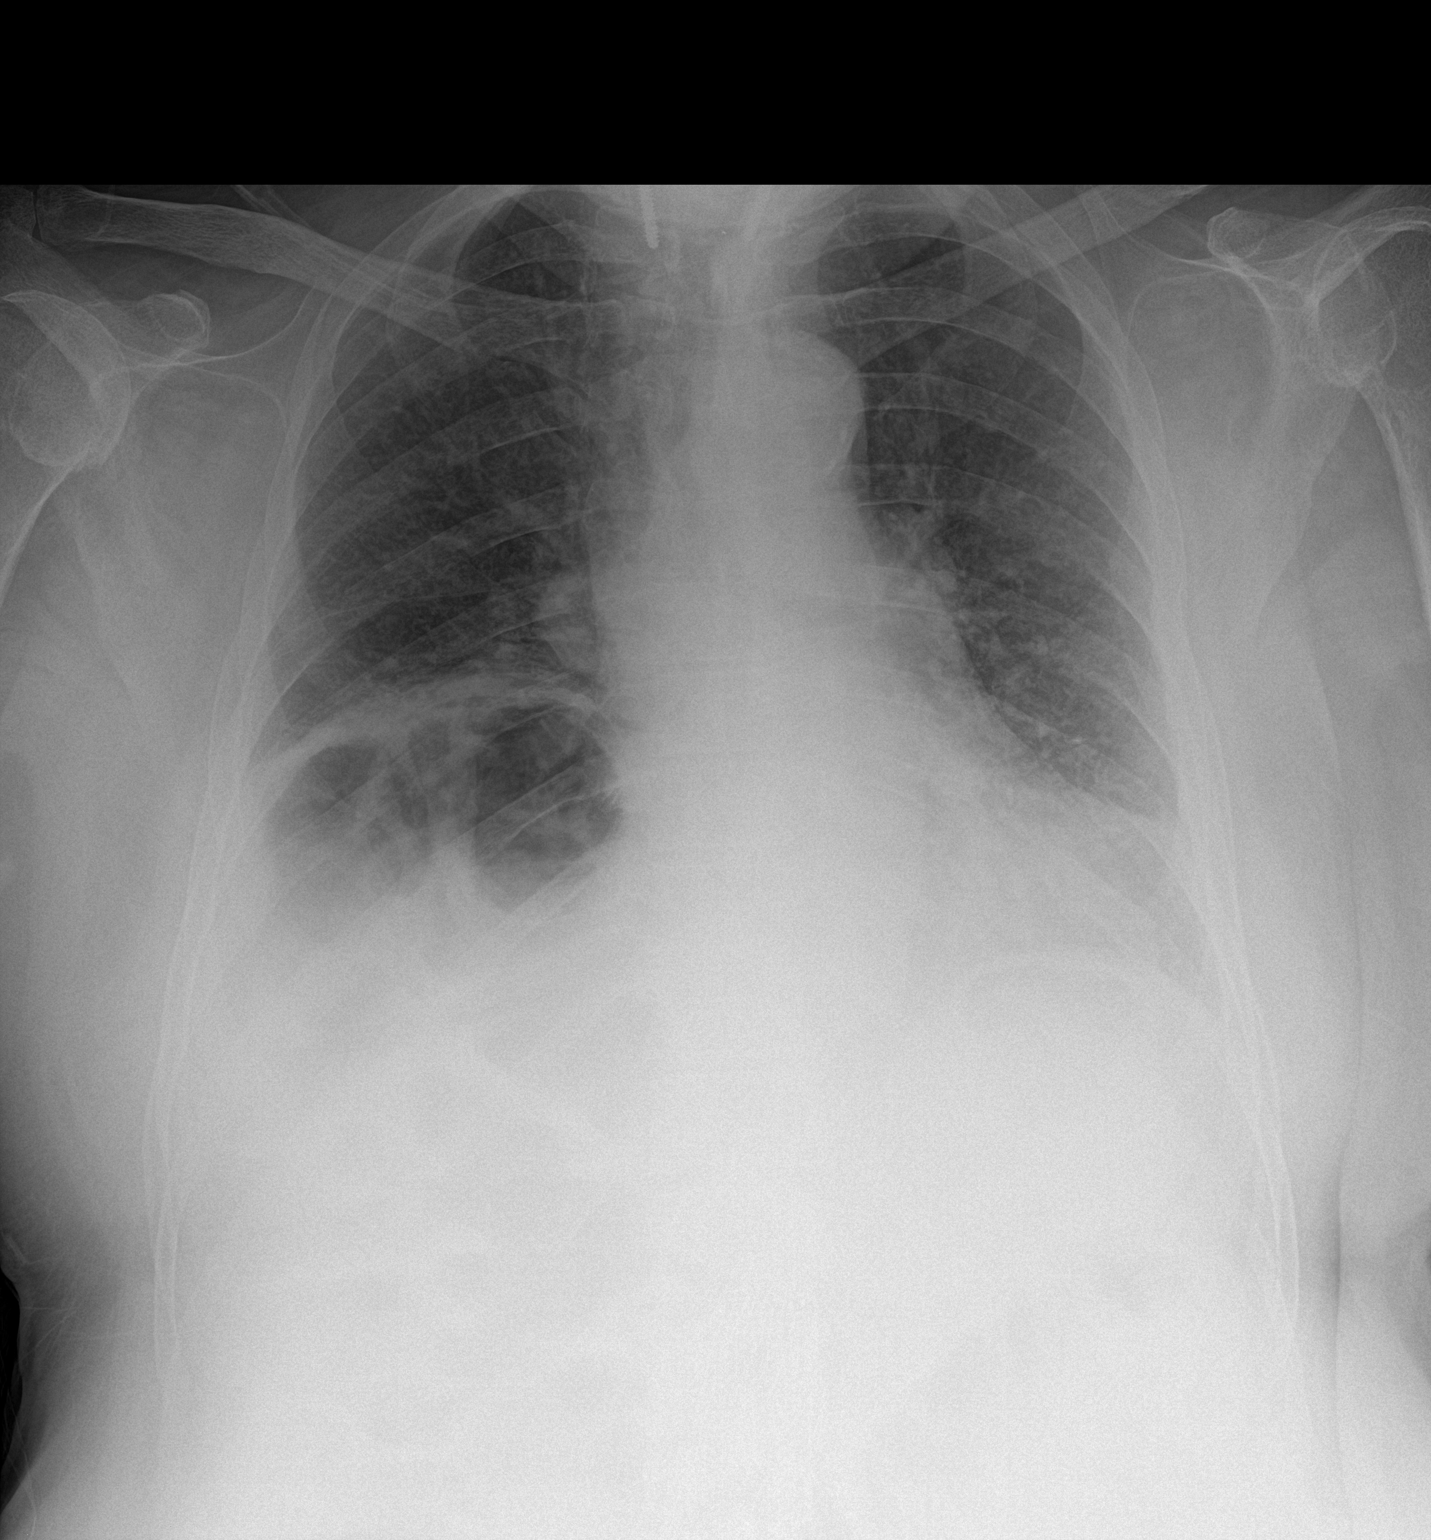

[2 of 2 positions shown; findings below may reference images not displayed]

FINDINGS: Heart size remains stable at the upper limits of normal. Both lungs
are clear. No evidence of pneumothorax or pleural effusion.
Elevation of right hemidiaphragm again noted. Fusion hardware again
seen in the cervical and upper thoracic spine.
IMPRESSION: Stable exam.  No active disease.

## 2016-04-04 IMAGING — CT CT ANGIO CHEST
2 of 6 series · 18 of 36 positions shown · IV contrast (APPLIED)
Comparison: None.

CLINICAL DATA: Shortness of breath.  Tachypnea.  Hypoxia.

EXAM:
CT ANGIOGRAPHY CHEST WITH CONTRAST
TECHNIQUE: Multidetector CT imaging of the chest was performed using the
standard protocol during bolus administration of intravenous
contrast. Multiplanar CT image reconstructions and MIPs were
obtained to evaluate the vascular anatomy.
CONTRAST:  100 mL Omnipaque 350

[Series 5: pe 1.0 thins · axial · 0.73mm/px · z∈[-804,-549]mm · 17 of 287 slices shown]
[im 16/287  lung]
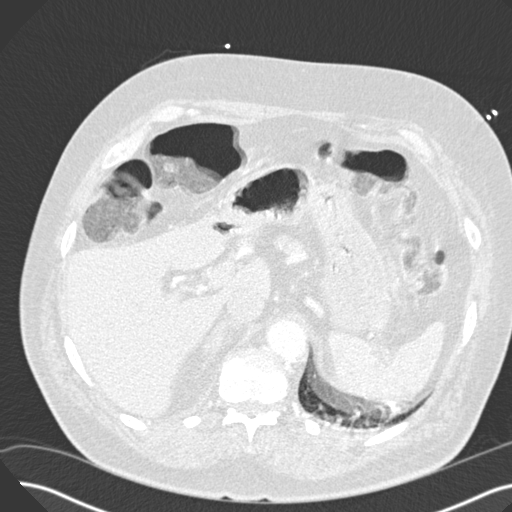
[im 32/287  mediastinal]
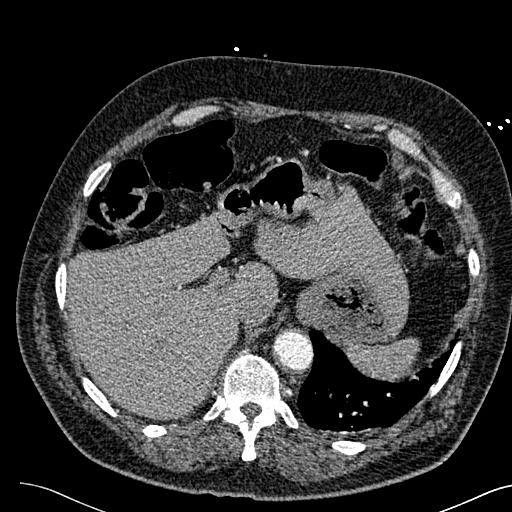
[im 48/287  lung]
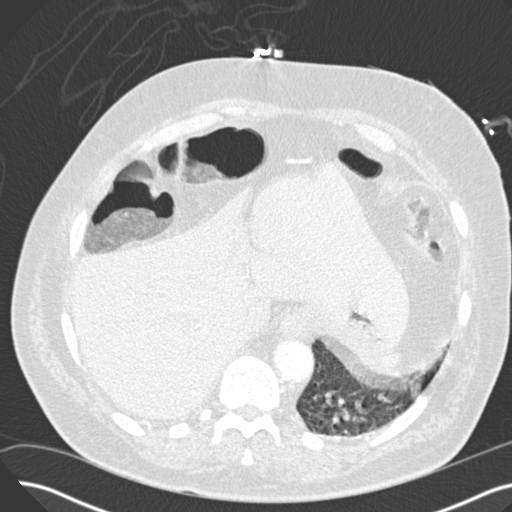
[im 64/287  mediastinal]
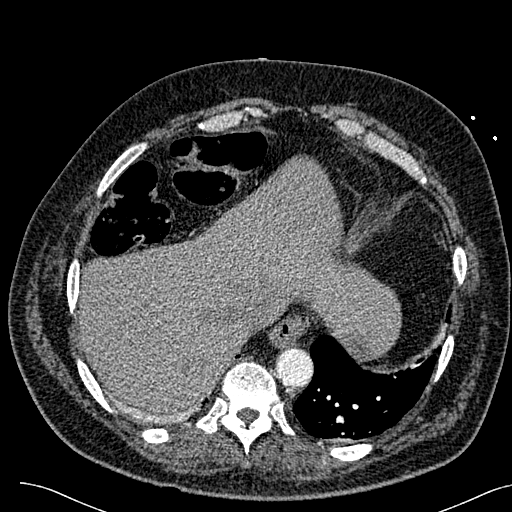
[im 80/287  lung]
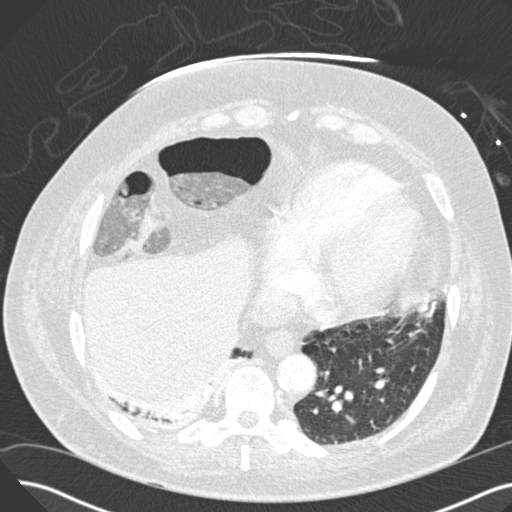
[im 96/287  mediastinal]
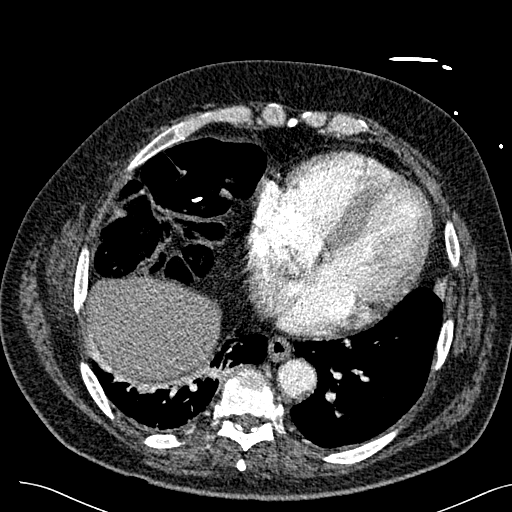
[im 112/287  lung]
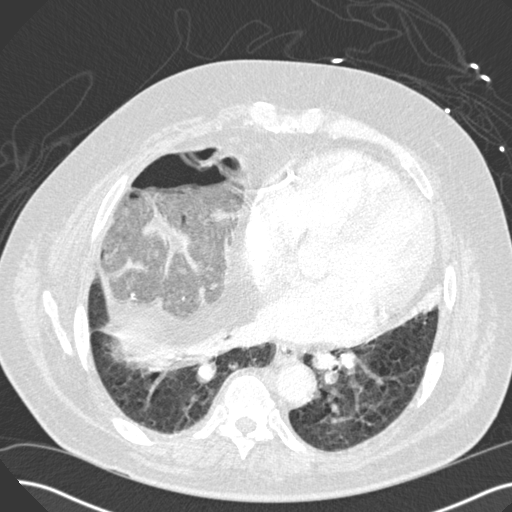
[im 128/287  mediastinal]
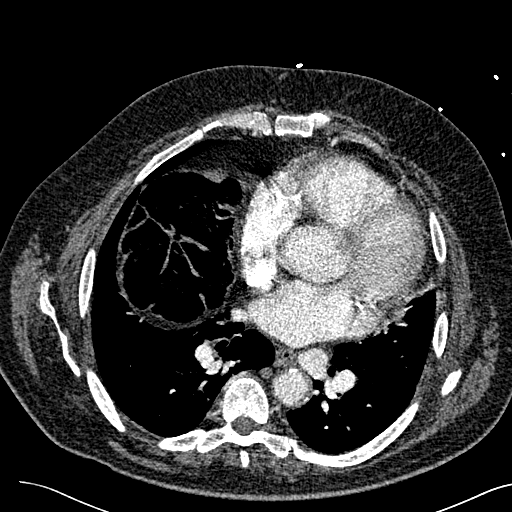
[im 144/287  lung]
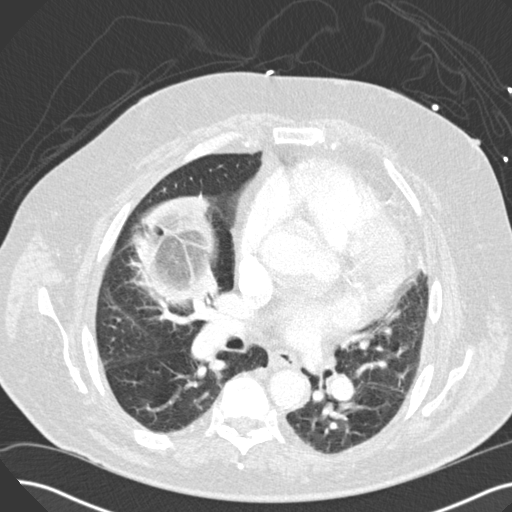
[im 159/287  mediastinal]
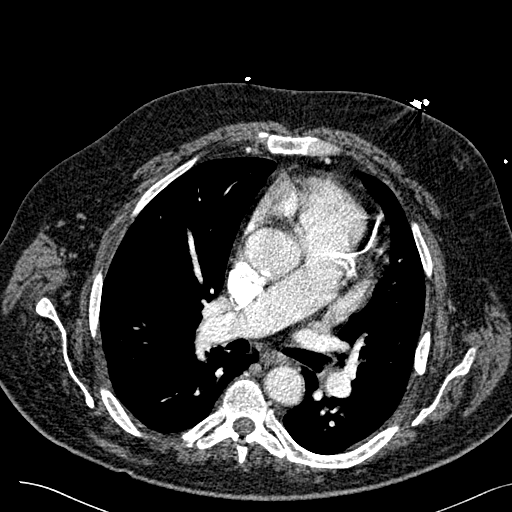
[im 175/287  lung]
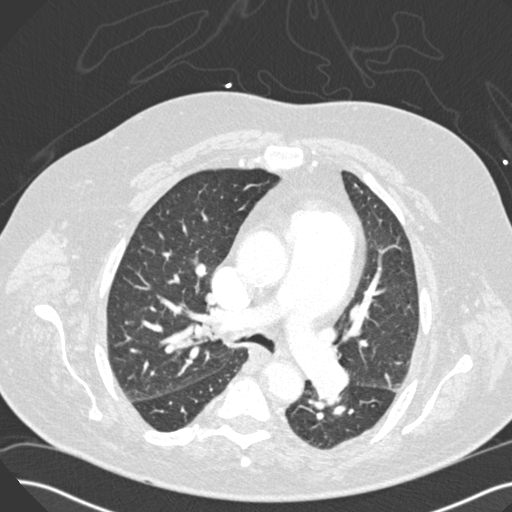
[im 191/287  mediastinal]
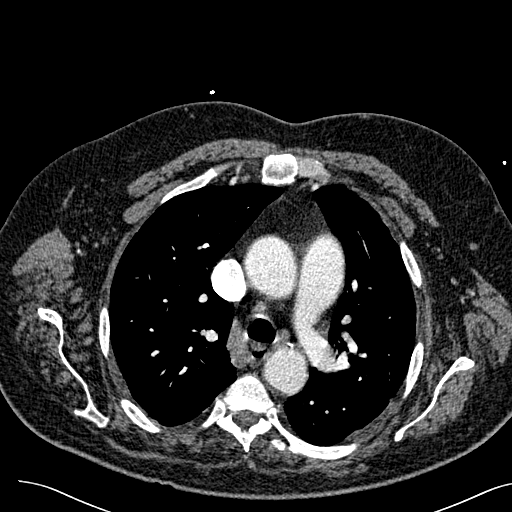
[im 207/287  lung]
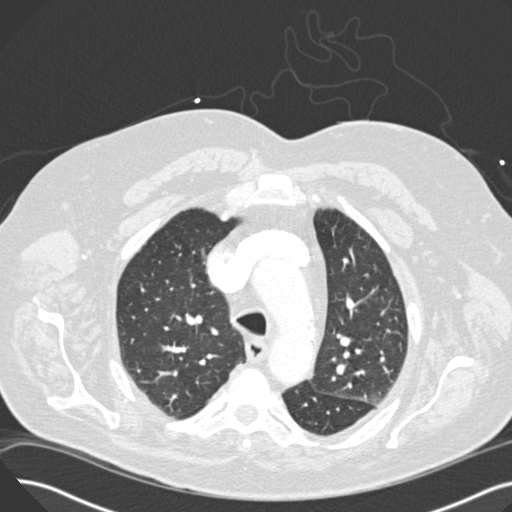
[im 223/287  mediastinal]
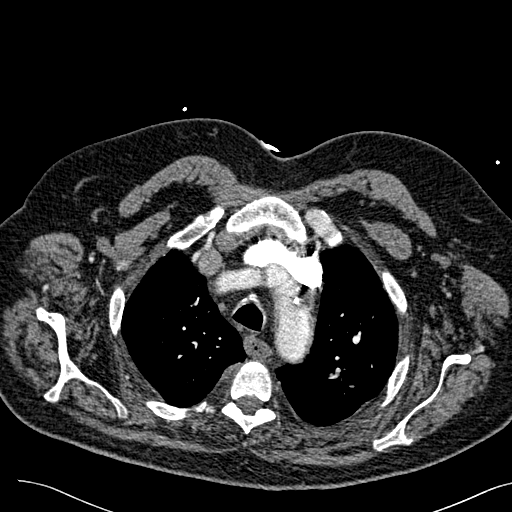
[im 239/287  lung]
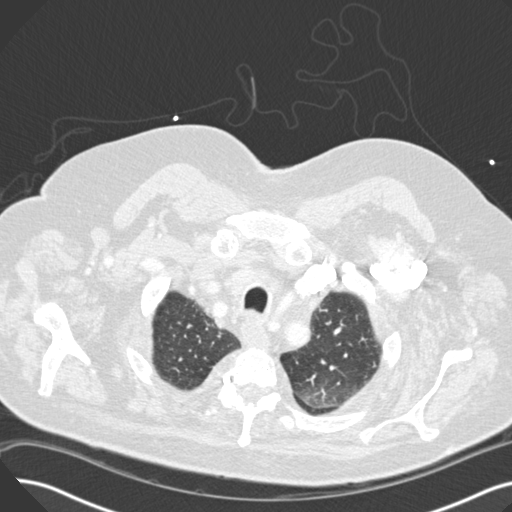
[im 255/287  mediastinal]
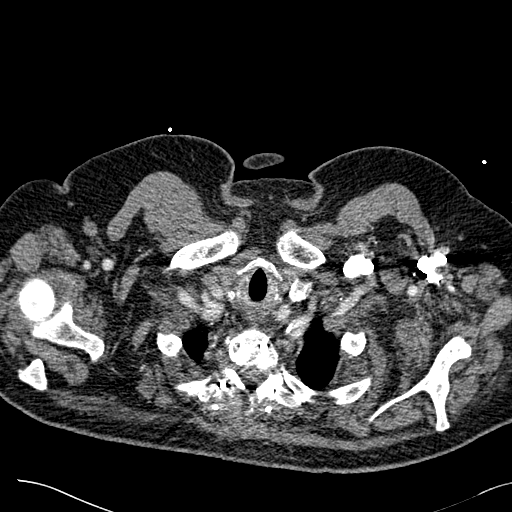
[im 271/287  lung]
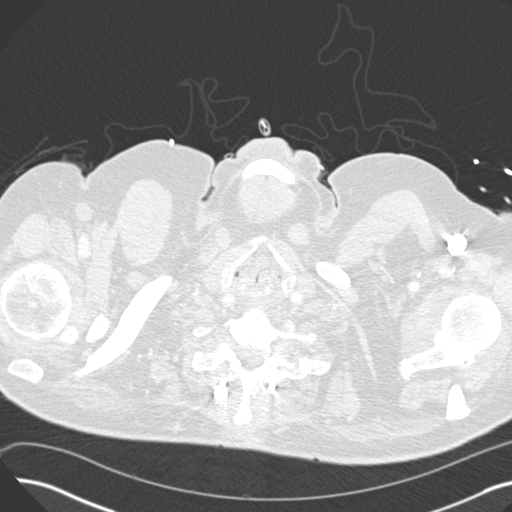

[Series 7: cor pe 2.0 mpr · coronal · 0.57mm/px · 1 of 157 slices shown]
[im 79/157  mediastinal]
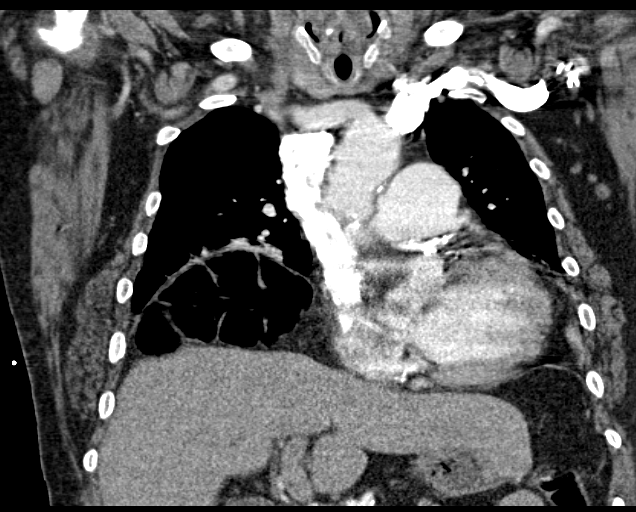

[18 of 36 positions shown; findings below may reference images not displayed]

FINDINGS: Mediastinum/Lymph Nodes: Satisfactory opacification of pulmonary
arteries is noted, and no pulmonary embolism identified. No evidence
of thoracic aortic dissection or aneurysm. No masses or
pathologically enlarged lymph nodes identified.

Lungs/Pleura: Elevation of right hemidiaphragm is demonstrated with
mild scarring or atelectasis at right lung base. No evidence of
pulmonary airspace disease or mass. No evidence of pleural effusion.

Musculoskeletal/Soft Tissues: No suspicious bone lesions or other
significant chest wall abnormality.

Upper Abdomen:  Unremarkable.

Review of the MIP images confirms the above findings.
IMPRESSION: No evidence of pulmonary embolism or other acute findings.

Elevation of right hemidiaphragm with mild right basilar scarring
versus atelectasis.

## 2016-04-05 ENCOUNTER — Other Ambulatory Visit (HOSPITAL_COMMUNITY): Payer: Self-pay

## 2016-04-05 ENCOUNTER — Encounter (HOSPITAL_COMMUNITY): Payer: Self-pay | Admitting: Interventional Radiology

## 2016-04-05 HISTORY — PX: IR GENERIC HISTORICAL: IMG1180011

## 2016-04-05 LAB — COMPREHENSIVE METABOLIC PANEL
ALT: 23 U/L (ref 17–63)
ANION GAP: 13 (ref 5–15)
AST: 26 U/L (ref 15–41)
Albumin: 3.3 g/dL — ABNORMAL LOW (ref 3.5–5.0)
Alkaline Phosphatase: 89 U/L (ref 38–126)
BUN: 50 mg/dL — ABNORMAL HIGH (ref 6–20)
CALCIUM: 9.4 mg/dL (ref 8.9–10.3)
CHLORIDE: 82 mmol/L — AB (ref 101–111)
CO2: 47 mmol/L — ABNORMAL HIGH (ref 22–32)
CREATININE: 1.01 mg/dL (ref 0.61–1.24)
Glucose, Bld: 128 mg/dL — ABNORMAL HIGH (ref 65–99)
Potassium: 4.1 mmol/L (ref 3.5–5.1)
SODIUM: 142 mmol/L (ref 135–145)
Total Bilirubin: 0.9 mg/dL (ref 0.3–1.2)
Total Protein: 7.1 g/dL (ref 6.5–8.1)

## 2016-04-05 LAB — CBC
HCT: 35.2 % — ABNORMAL LOW (ref 39.0–52.0)
Hemoglobin: 10 g/dL — ABNORMAL LOW (ref 13.0–17.0)
MCH: 28.8 pg (ref 26.0–34.0)
MCHC: 28.4 g/dL — AB (ref 30.0–36.0)
MCV: 101.4 fL — AB (ref 78.0–100.0)
PLATELETS: 135 10*3/uL — AB (ref 150–400)
RBC: 3.47 MIL/uL — AB (ref 4.22–5.81)
RDW: 18.3 % — ABNORMAL HIGH (ref 11.5–15.5)
WBC: 8.5 10*3/uL (ref 4.0–10.5)

## 2016-04-05 IMAGING — CT CT CERVICAL SPINE W/O CM
3 of 6 series · 11 of 33 positions shown, 13 images · non-contrast
Comparison: None.

11/23/2010

CLINICAL DATA: Fall with head and neck pain. Recent cervical fusion

EXAM:
CT HEAD WITHOUT CONTRAST
CT CERVICAL SPINE WITHOUT CONTRAST
TECHNIQUE: Multidetector CT imaging of the head and cervical spine was
performed following the standard protocol without intravenous
contrast. Multiplanar CT image reconstructions of the cervical spine
were also generated.

[Series 7: coronals · coronal · 0.23mm/px · 3 of 47 slices shown]
[im 10/47  bone]
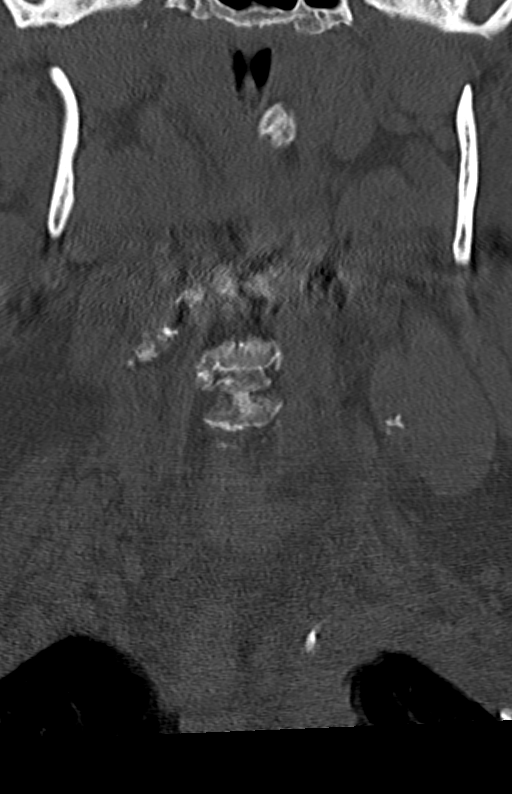
[im 19/47  bone]
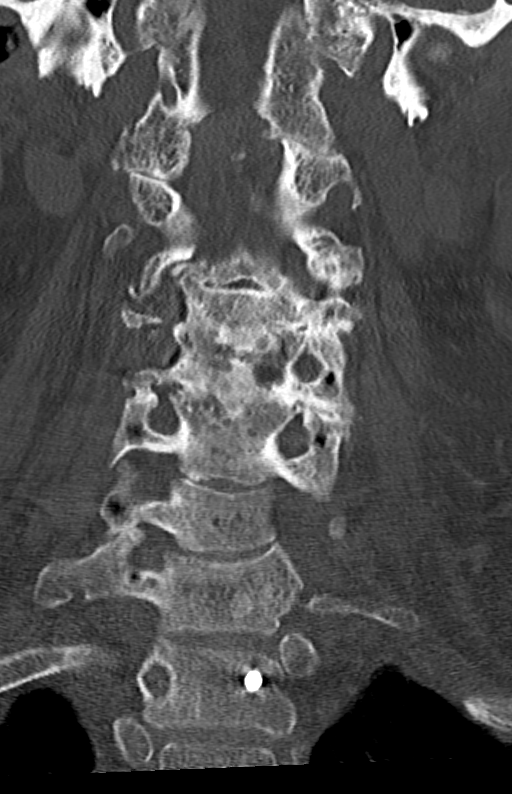
[im 28/47  bone]
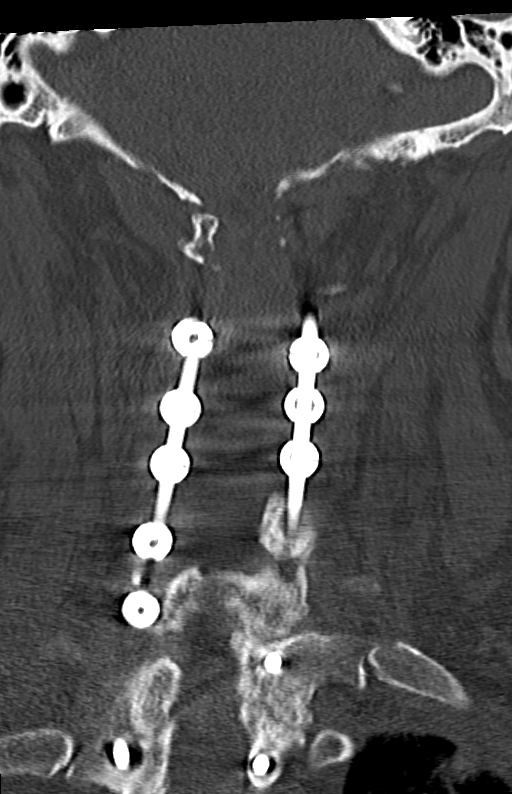

[Series 8: sagittals · sagittal · 0.19mm/px · 5 of 35 slices shown, 6 images]
[im 12/35  bone]
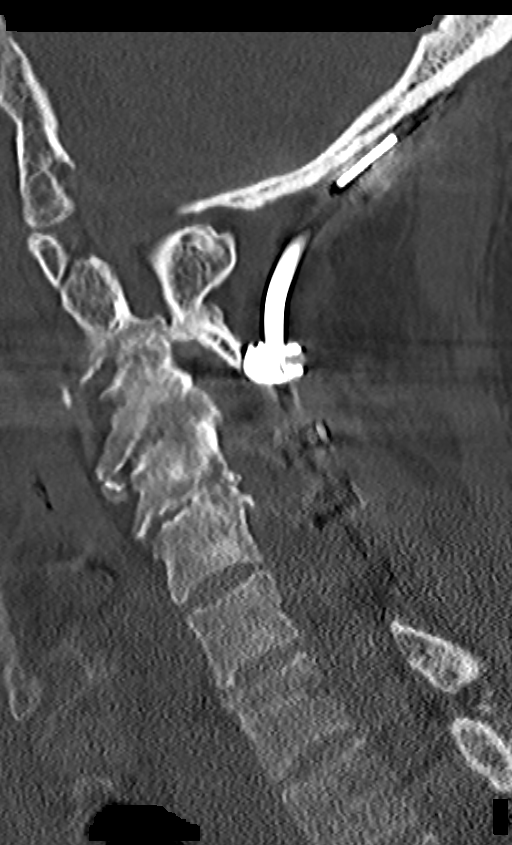
[im 15/35  bone]
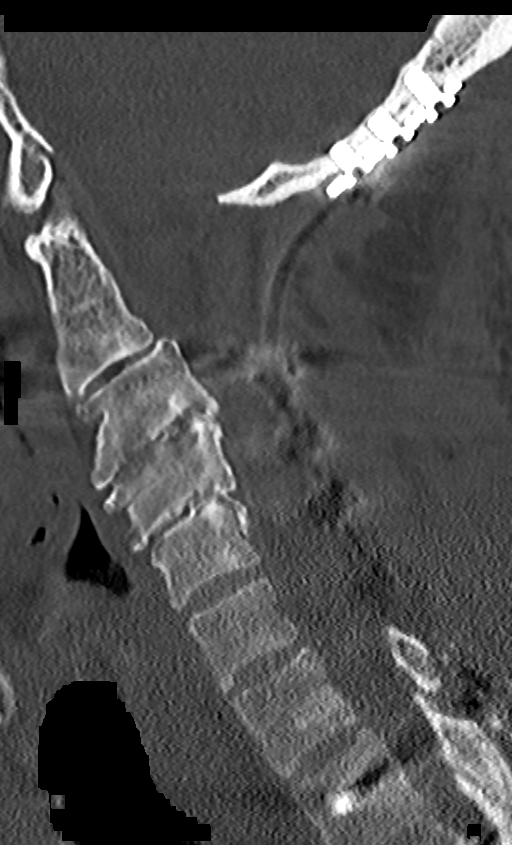
[im 18/35  soft-tissue]
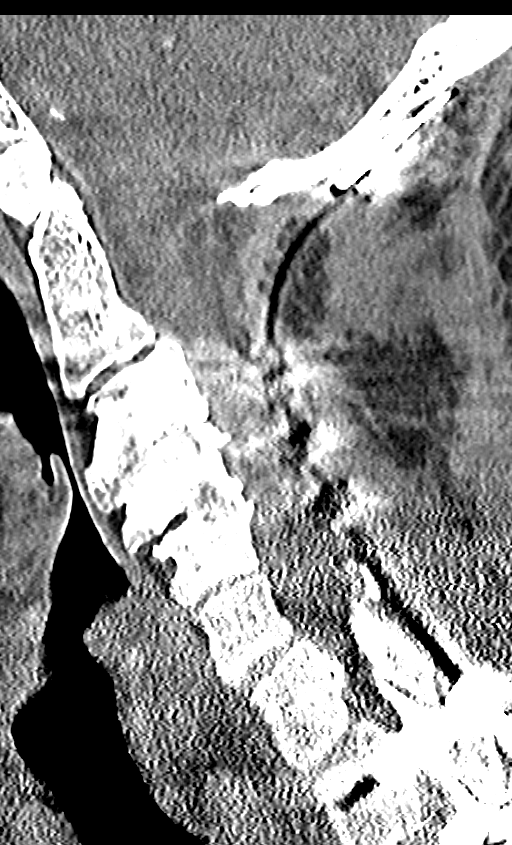
[im 18/35  bone]
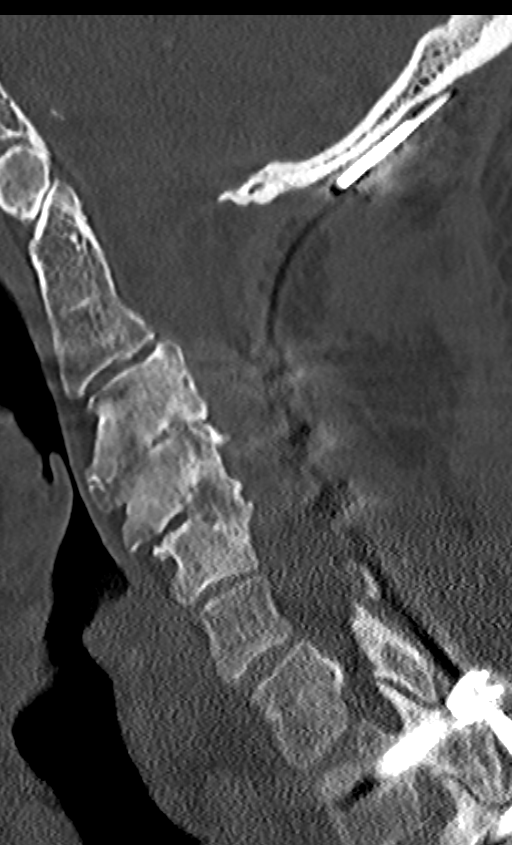
[im 20/35  bone]
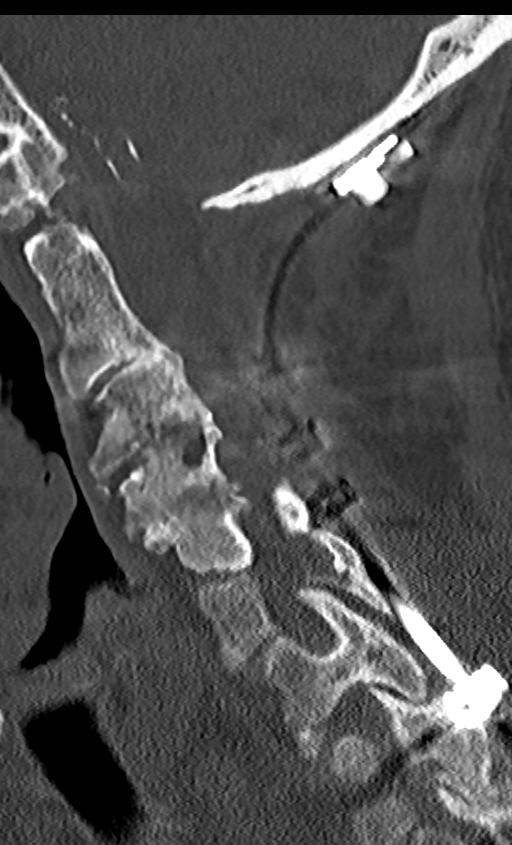
[im 23/35  bone]
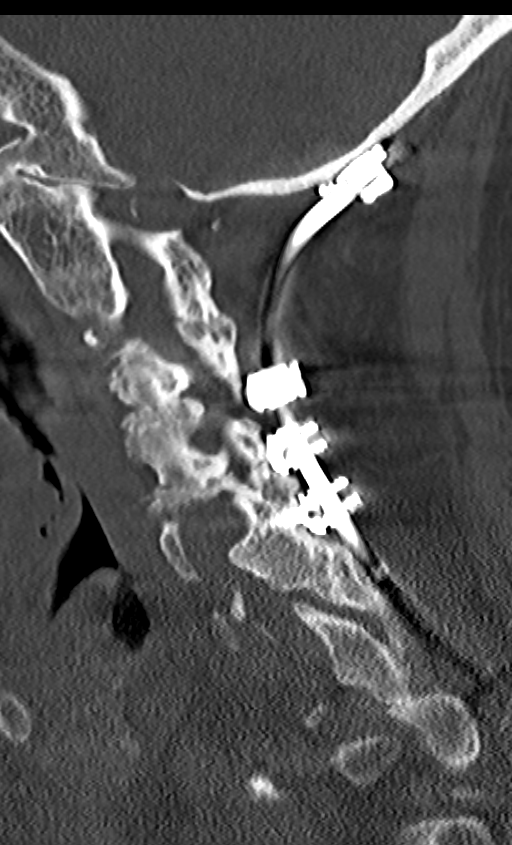

[Series 9: orthogonals · axial · 0.17mm/px · z∈[-213,-131]mm · 3 of 90 slices shown, 4 images]
[im 23/90  soft-tissue]
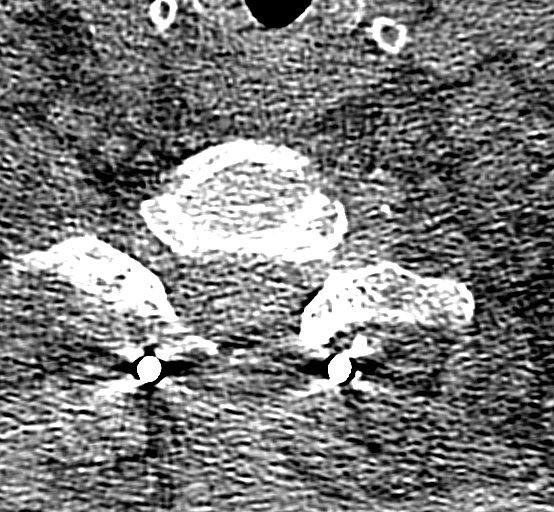
[im 23/90  bone]
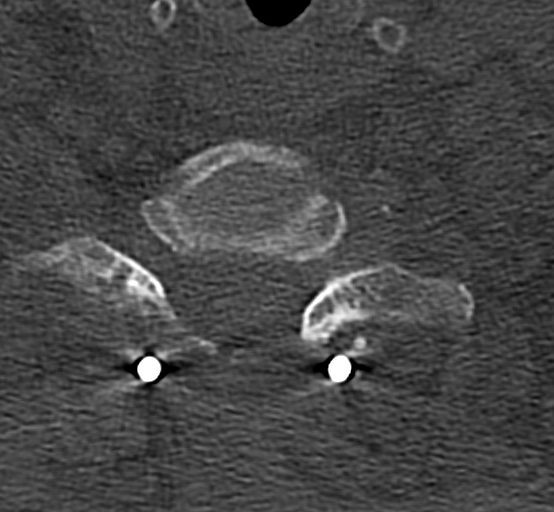
[im 45/90  bone]
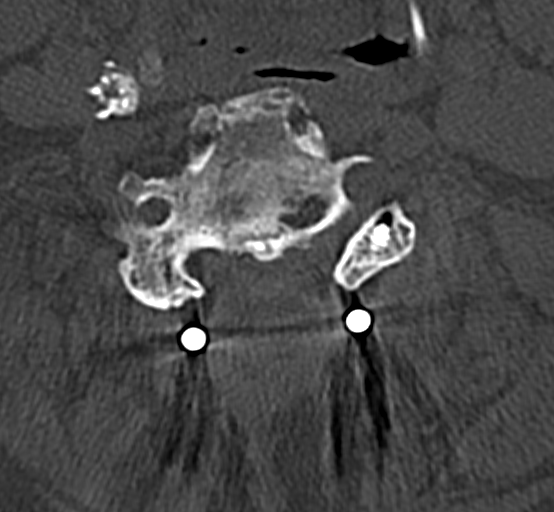
[im 67/90  bone]
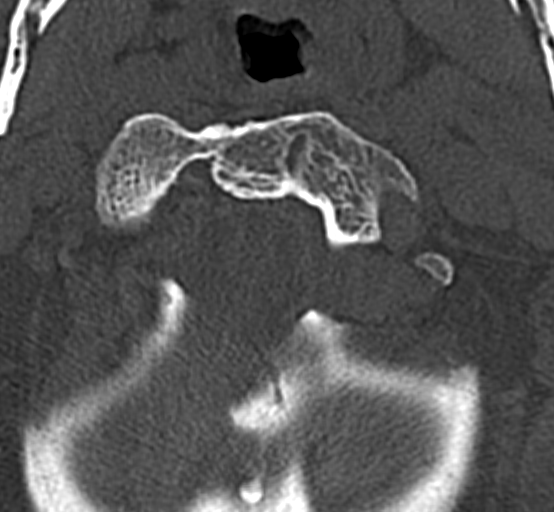

[11 of 33 positions shown; findings below may reference images not displayed]

FINDINGS: CT HEAD FINDINGS

There is no intracranial hemorrhage or extra-axial fluid collection.

The brain and CSF spaces appear unremarkable.

The calvarium and skullbase are intact.

CT CERVICAL SPINE FINDINGS

There is extensive posterior decompression with instrumented fusion.
The vertical interconnecting hardware extends up onto the occipital
skull and down into the upper thoracic spine. There is no evidence
of an acute cervical spine fracture. The vertebral column is intact.
The fixation hardware and remaining posterior elements are intact.
No acute soft tissue abnormality is evident.
IMPRESSION: 1. No evidence of an acute traumatic intracranial injury
2. No evidence of acute cervical spine fracture
3. Intact appearances of the cervical fusion hardware.

## 2016-04-05 MED ORDER — VANCOMYCIN HCL IN DEXTROSE 1-5 GM/200ML-% IV SOLN
INTRAVENOUS | Status: AC
  Filled 2016-04-05: qty 200

## 2016-04-05 MED ORDER — FENTANYL CITRATE (PF) 100 MCG/2ML IJ SOLN
INTRAMUSCULAR | Status: AC | PRN
  Administered 2016-04-05 (×2): 50 ug via INTRAVENOUS

## 2016-04-05 MED ORDER — MIDAZOLAM HCL 2 MG/2ML IJ SOLN
INTRAMUSCULAR | Status: AC
  Filled 2016-04-05: qty 2

## 2016-04-05 MED ORDER — LIDOCAINE HCL 1 % IJ SOLN
INTRAMUSCULAR | Status: AC
  Filled 2016-04-05: qty 20

## 2016-04-05 MED ORDER — MIDAZOLAM HCL 2 MG/2ML IJ SOLN
INTRAMUSCULAR | Status: AC | PRN
  Administered 2016-04-05 (×2): 1 mg via INTRAVENOUS

## 2016-04-05 MED ORDER — FENTANYL CITRATE (PF) 100 MCG/2ML IJ SOLN
INTRAMUSCULAR | Status: AC
  Filled 2016-04-05: qty 2

## 2016-04-05 MED ORDER — LIDOCAINE HCL 1 % IJ SOLN
INTRAMUSCULAR | Status: AC | PRN
  Administered 2016-04-05: 20 mL

## 2016-04-05 MED ORDER — IOPAMIDOL (ISOVUE-300) INJECTION 61%
INTRAVENOUS | Status: AC
  Administered 2016-04-05: 25 mL
  Filled 2016-04-05: qty 50

## 2016-04-05 NOTE — Procedures (Signed)
Interventional Radiology Procedure Note  Procedure:  Gastrostomy tube placement  Complications:  None  Estimated Blood Loss:  < 10 mL  20 Fr bumper retention gastrostomy tube placed with tip in body of stomach.  OK to use in 24 hours.  Chad Huffman T. Jan Walters, M.D Pager:  [phone]    

## 2016-04-05 NOTE — Sedation Documentation (Signed)
Respiratory therapy at bedside with patient during procedure. Pt requires frequent suctioning

## 2016-04-05 NOTE — Sedation Documentation (Signed)
Pt  tolerated procedure well.

## 2016-04-05 NOTE — Sedation Documentation (Signed)
Vital signs stable. 

## 2016-04-06 ENCOUNTER — Other Ambulatory Visit (HOSPITAL_COMMUNITY): Payer: Self-pay

## 2016-04-06 LAB — CBC
HCT: 35.3 % — ABNORMAL LOW (ref 39.0–52.0)
HEMOGLOBIN: 10 g/dL — AB (ref 13.0–17.0)
MCH: 28.2 pg (ref 26.0–34.0)
MCHC: 28.3 g/dL — AB (ref 30.0–36.0)
MCV: 99.7 fL (ref 78.0–100.0)
PLATELETS: 131 10*3/uL — AB (ref 150–400)
RBC: 3.54 MIL/uL — ABNORMAL LOW (ref 4.22–5.81)
RDW: 18.3 % — AB (ref 11.5–15.5)
WBC: 18.3 10*3/uL — ABNORMAL HIGH (ref 4.0–10.5)

## 2016-04-06 LAB — BASIC METABOLIC PANEL
Anion gap: 11 (ref 5–15)
BUN: 48 mg/dL — AB (ref 6–20)
CALCIUM: 9 mg/dL (ref 8.9–10.3)
CO2: 47 mmol/L — ABNORMAL HIGH (ref 22–32)
CREATININE: 1.19 mg/dL (ref 0.61–1.24)
Chloride: 82 mmol/L — ABNORMAL LOW (ref 101–111)
Glucose, Bld: 134 mg/dL — ABNORMAL HIGH (ref 65–99)
Potassium: 3.8 mmol/L (ref 3.5–5.1)
SODIUM: 140 mmol/L (ref 135–145)

## 2016-04-06 IMAGING — CR DG CHEST 1V PORT
1 series · 1 of 1 positions shown · non-contrast
Comparison: None.

CLINICAL DATA: Respiratory failure

EXAM:
PORTABLE CHEST - 1 VIEW

[AP]
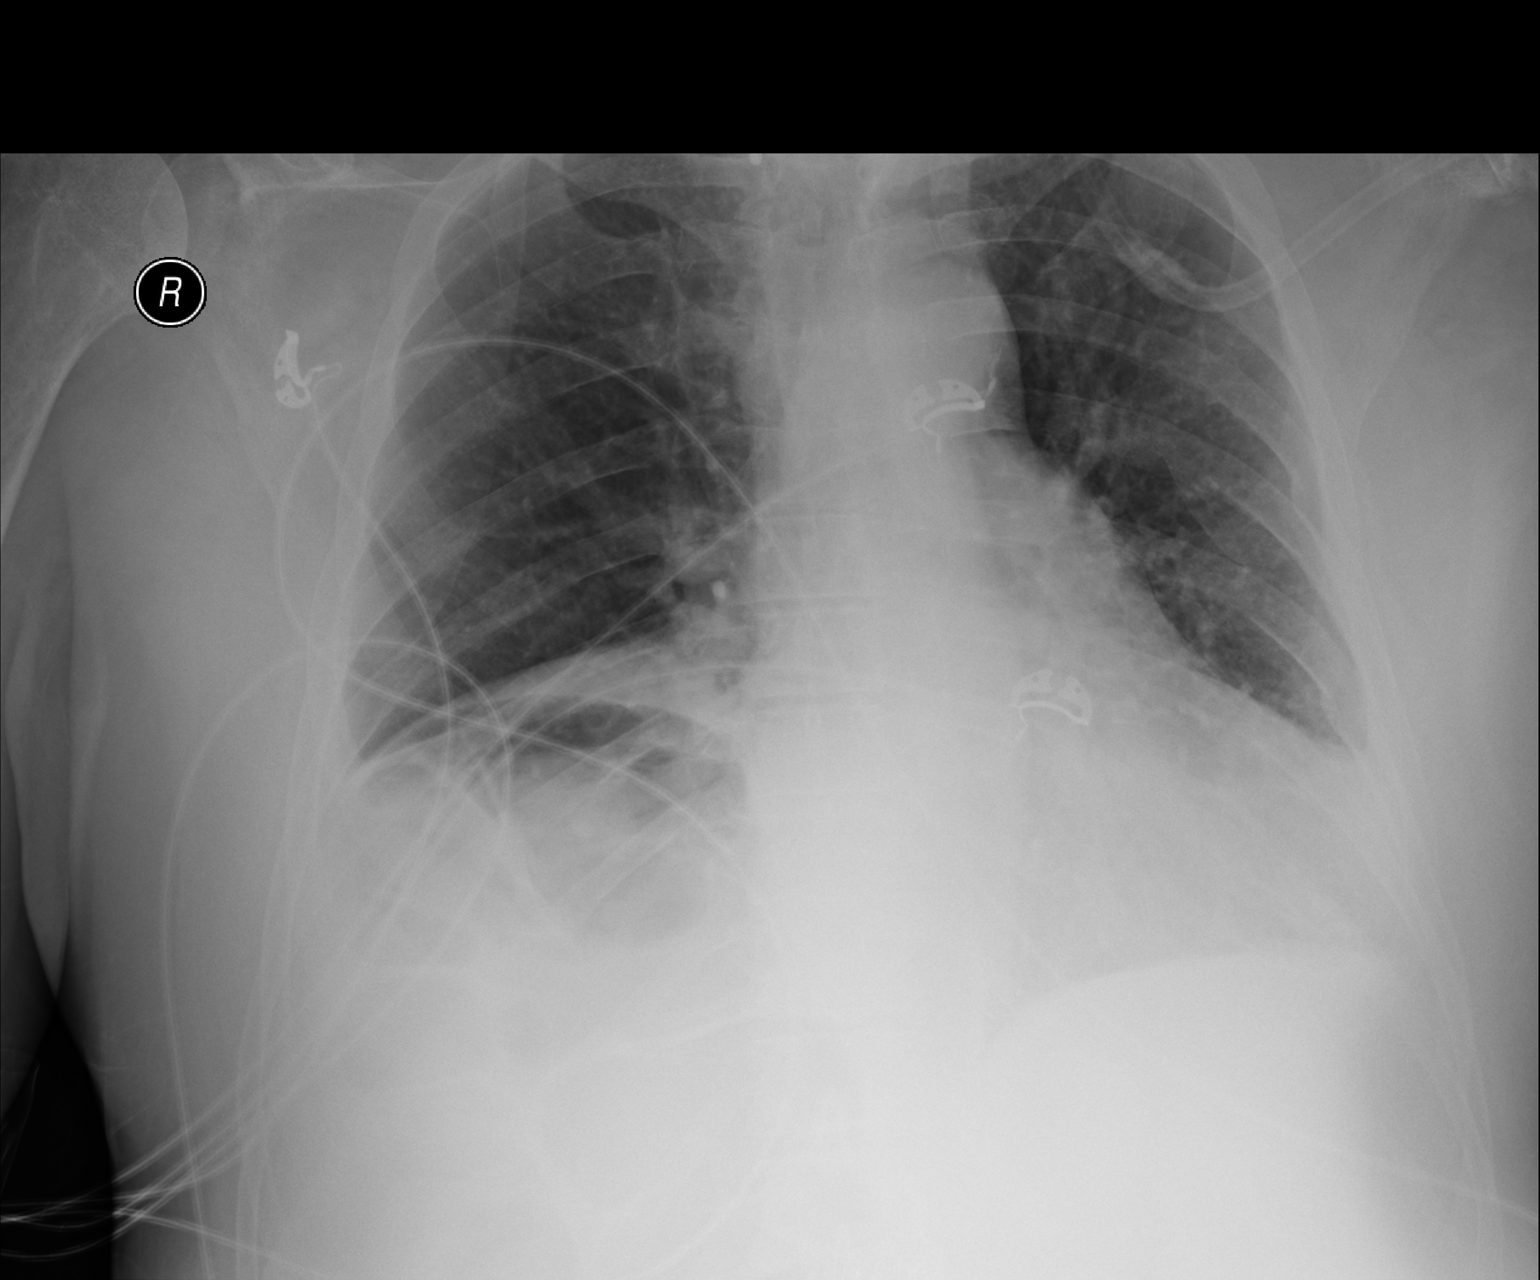

[1 of 1 positions shown; findings below may reference images not displayed]

FINDINGS: There is right hemidiaphragm elevation. There is moderate
cardiomegaly. There is mild medial right base opacity which may be
atelectatic. No large effusions are evident.
IMPRESSION: Mild patchy right base opacity, and right hemidiaphragm elevation.
No pneumothorax or large effusion.

## 2016-04-06 IMAGING — MR MR HEAD W/O CM
13 of 14 series · 39 of 48 positions shown · non-contrast
Comparison: CT head and cervical spine 08/05/2014.

CLINICAL DATA: Multiple syncopal events. Evaluate for stroke.
Recent fall. Recent cervical fusion.

EXAM:
MRI HEAD WITHOUT CONTRAST
TECHNIQUE: Multiplanar, multiecho pulse sequences of the brain and surrounding
structures were obtained without intravenous contrast.

[Series 3: DWI · axial · 3.0mm · 1.09mm/px · z∈[-46,+97]mm · 8 of 98 slices shown (1 of 5)]
[im 1/98]
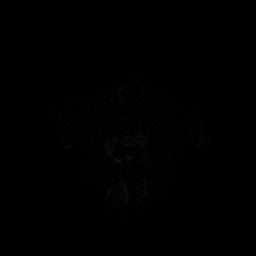
[im 14/98]
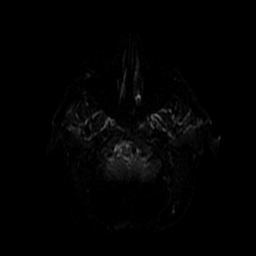
[im 28/98]
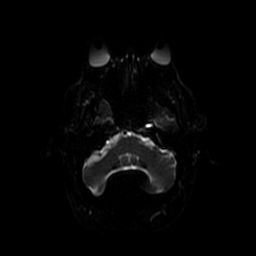
[im 42/98]
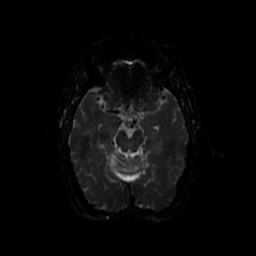
[im 56/98]
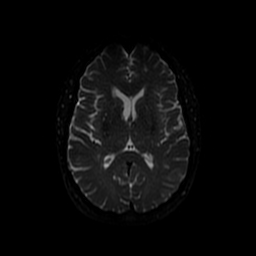
[im 70/98]
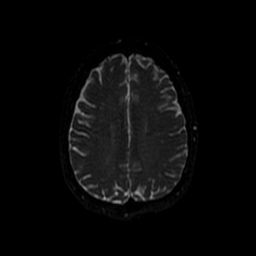
[im 84/98]
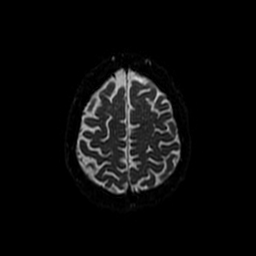
[im 98/98]
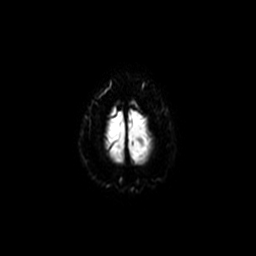

[Series 4: DWI · coronal · 5.0mm · 1.09mm/px · 6 of 68 slices shown (2 of 5)]
[im 1/68]
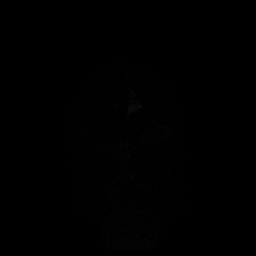
[im 14/68]
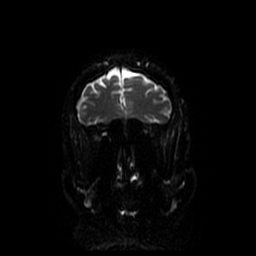
[im 27/68]
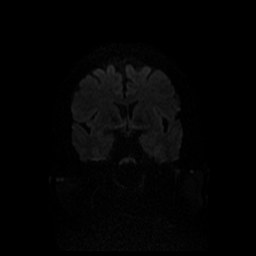
[im 41/68]
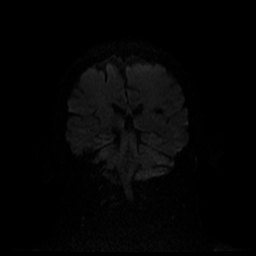
[im 54/68]
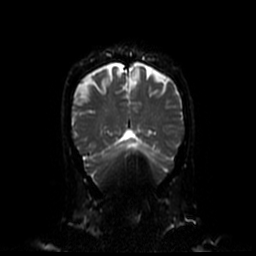
[im 68/68]
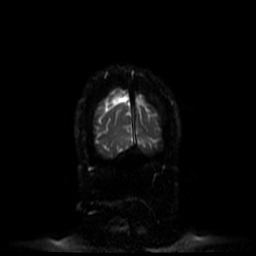

[Series 5: T2 · axial · 5.0mm · 0.47mm/px · z∈[-47,+96]mm · 2 of 25 slices shown (1 of 2)]
[im 1/25]
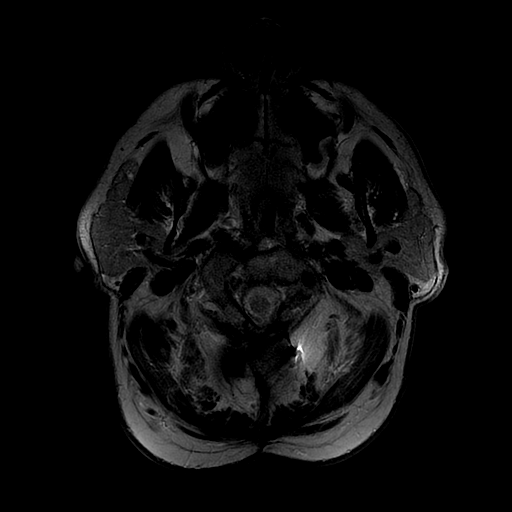
[im 25/25]
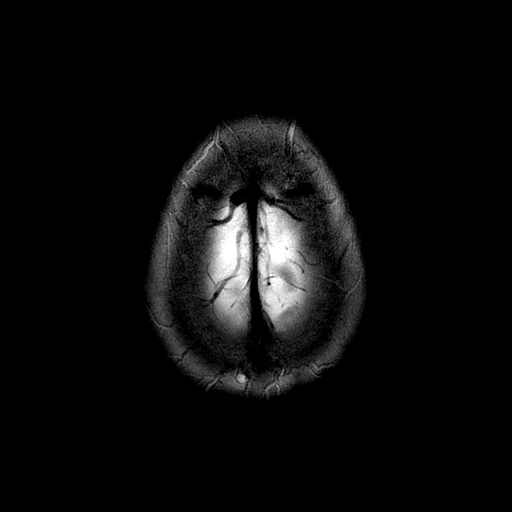

[Series 6: FLAIR · axial · 5.0mm · 0.47mm/px · z∈[-47,+96]mm · 2 of 25 slices shown]
[im 1/25]
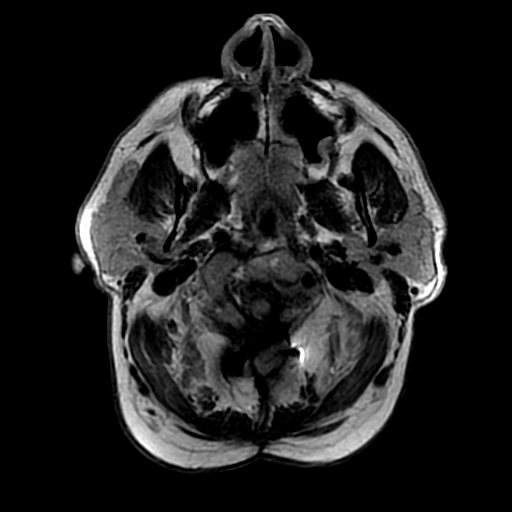
[im 25/25]
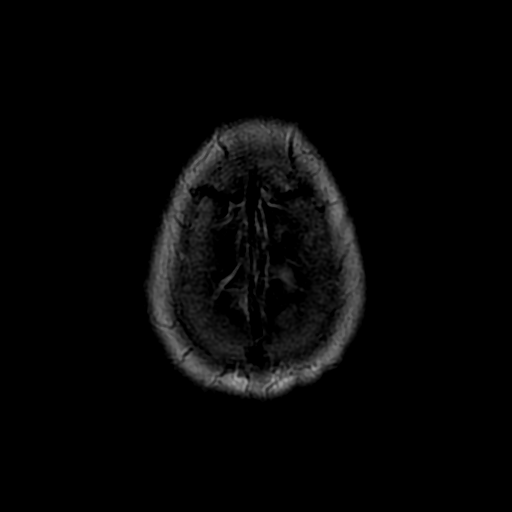

[Series 7: T2 · coronal · 5.0mm · 0.43mm/px · 2 of 29 slices shown (2 of 2)]
[im 1/29]
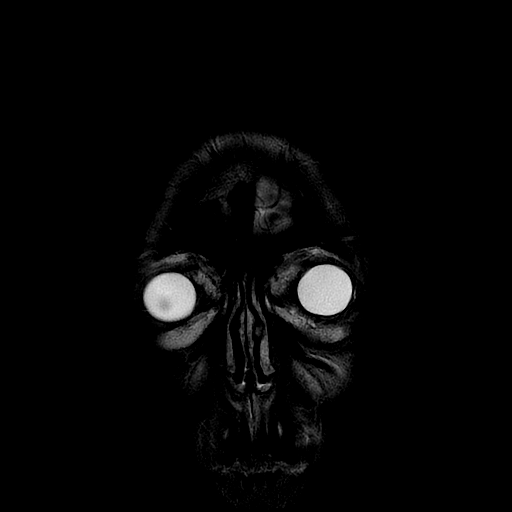
[im 29/29]
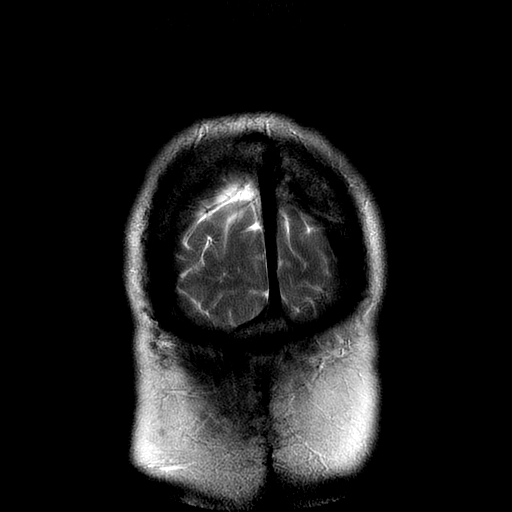

[Series 8: ax mpgr · axial · 5.0mm · 0.47mm/px · z∈[-47,+96]mm · 2 of 25 slices shown]
[im 1/25]
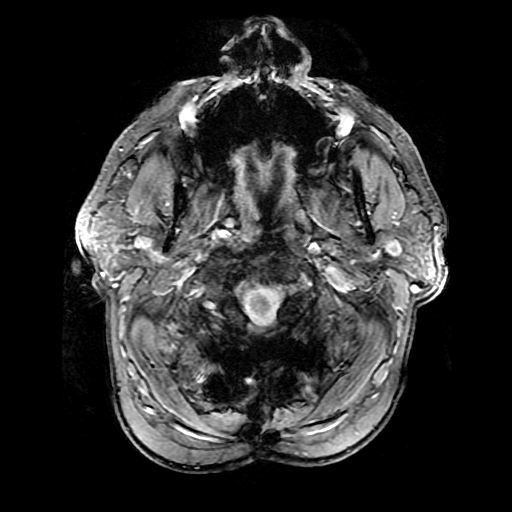
[im 25/25]
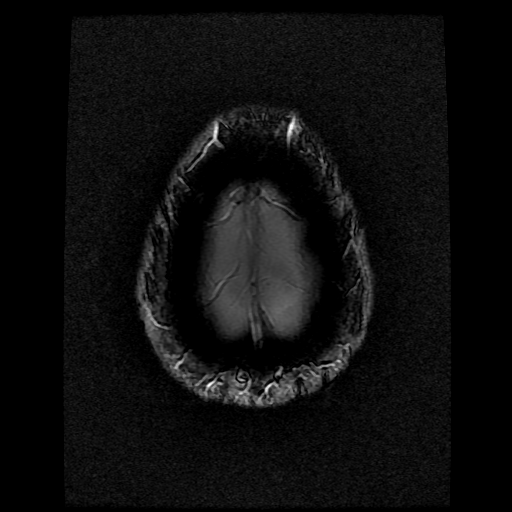

[Series 10: T1 · sagittal · 5.0mm · 0.47mm/px · 2 of 23 slices shown]
[im 1/23]
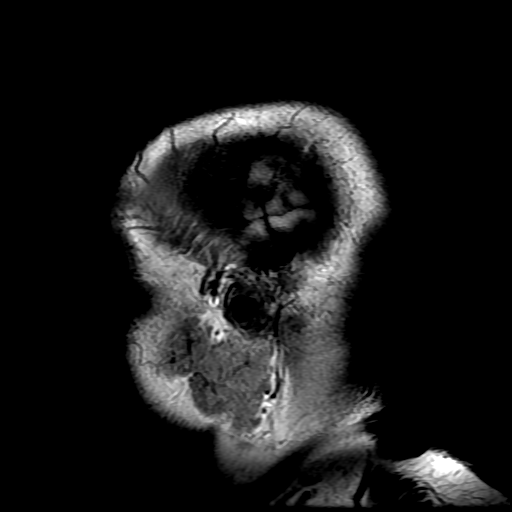
[im 23/23]
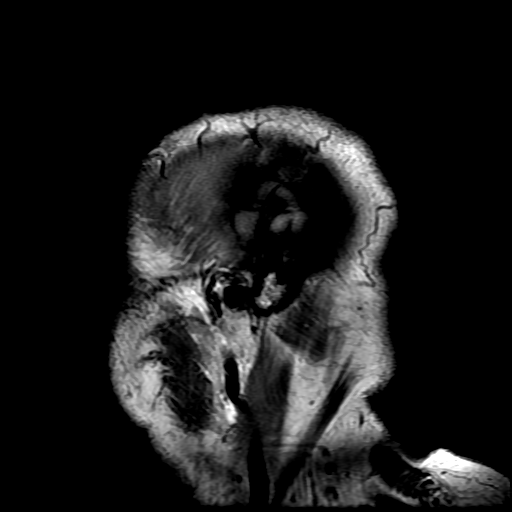

[Series 11: DWI · axial · 5.0mm · 0.94mm/px · z∈[-41,+88]mm · 2 of 21 slices shown (3 of 5)]
[im 1/21]
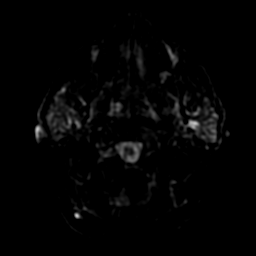
[im 21/21]
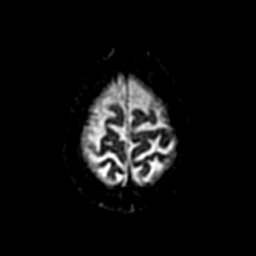

[Series 300: DWI · axial · 3.0mm · 1.09mm/px · z∈[-46,+97]mm · 4 of 49 slices shown (4 of 5)]
[im 1/49]
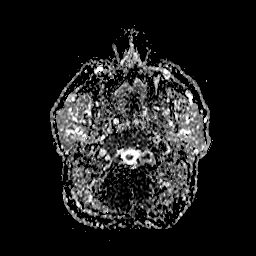
[im 17/49]
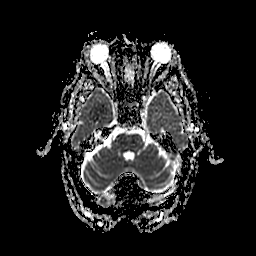
[im 33/49]
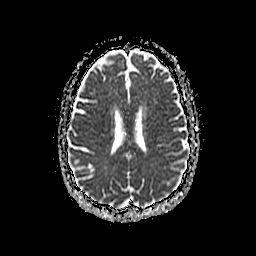
[im 49/49]
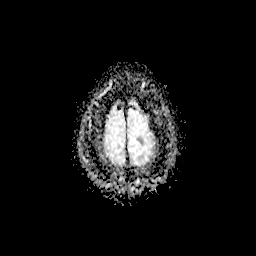

[Series 400: DWI · coronal · 5.0mm · 1.09mm/px · 3 of 34 slices shown (5 of 5)]
[im 1/34]
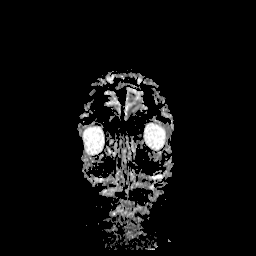
[im 17/34]
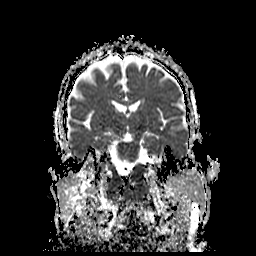
[im 34/34]
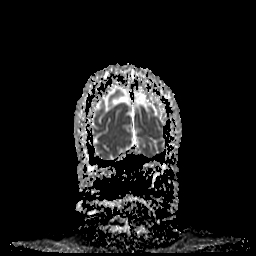

[Series 1100: ADC · axial · 5.0mm · 0.94mm/px · z∈[-41,+88]mm · 2 of 21 slices shown]
[im 1/21]
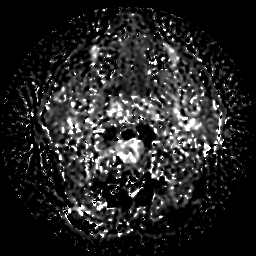
[im 21/21]
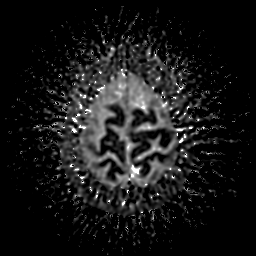

[Series 1101: prop eadc · axial · 5.0mm · 0.94mm/px · z∈[-41,+88]mm · 2 of 21 slices shown]
[im 1/21]
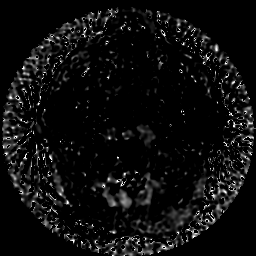
[im 21/21]
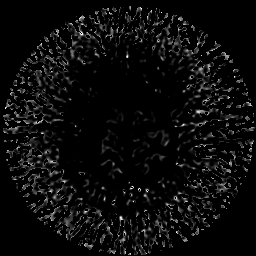

[Series 1102: prop comb · axial · 5.0mm · 0.94mm/px · z∈[-41,+88]mm · 2 of 21 slices shown]
[im 1/21]
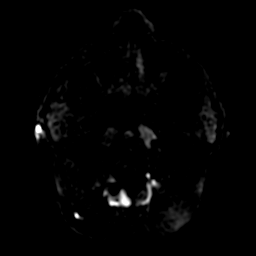
[im 21/21]
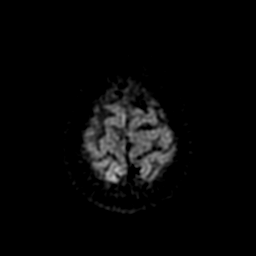

[39 of 48 positions shown; findings below may reference images not displayed]

FINDINGS: Signal dropout in the posterior fossa due to hardware. Overall study
diagnostic.

No restricted diffusion to suggest acute stroke. Mild atrophy and
chronic microvascular ischemic change. No acute or chronic
hemorrhage. Scattered chronic lacunar infarcts. Midline structures
incompletely evaluated but no gross cervicomedullary compression.
Normal pituitary and cerebellar tonsils. Flow voids are maintained
in the carotid, basilar, and LEFT vertebral arteries. RIGHT
vertebral diminutive, ending in PICA.No sinus or mastoid disease.
Chronic LEFT cerebellar infarct.
IMPRESSION: No acute intracranial findings.  Chronic changes as described.

Specifically, no evidence for acute cerebral infarction.

## 2016-04-06 NOTE — Progress Notes (Signed)
Referring Physician(s): Merton Border  Supervising Physician: Corrie Mckusick  Patient Status:  [facility] - In-pt  Chief Complaint: Respiratory failure Tracheostomy  Subjective:  Chad Huffman is awake. He is trying to communicate but difficult given trach.  Allergies: Ampicillin; Nortriptyline; Brimonidine; Cyclobenzaprine; Tizanidine; and Gabapentin  Medications: Prior to Admission medications   Medication Sig Start Date End Date Taking? Authorizing Provider  ALPRAZolam (XANAX) 0.5 MG tablet Take 0.25-0.5 mg by mouth every 8 (eight) hours as needed for anxiety.     Historical Provider, MD  aspirin EC 81 MG tablet Take 81 mg by mouth daily.    Historical Provider, MD  baclofen (LIORESAL) 20 MG tablet Take 20 mg by mouth 3 (three) times daily as needed. For muscle spasms. 02/25/15   Historical Provider, MD  budesonide-formoterol (SYMBICORT) 160-4.5 MCG/ACT inhaler Inhale 2 puffs into the lungs 2 (two) times daily.    Historical Provider, MD  diazepam (VALIUM) 2 MG tablet Take 1 tablet (2 mg total) by mouth every 8 (eight) hours as needed for muscle spasms. 02/18/16 02/17/17  Jami L Hagler, PA-C  etodolac (LODINE) 500 MG tablet Take 500 mg by mouth 2 (two) times daily.    Historical Provider, MD  furosemide (LASIX) 40 MG tablet Take 1 tablet (40 mg total) by mouth 2 (two) times daily. 08/12/14   Cherene Altes, MD  Ipratropium-Albuterol (COMBIVENT RESPIMAT) 20-100 MCG/ACT AERS respimat Inhale 1 puff into the lungs every 6 (six) hours. 08/12/14   Cherene Altes, MD  ipratropium-albuterol (DUONEB) 0.5-2.5 (3) MG/3ML SOLN Take 3 mLs by nebulization every 6 (six) hours. Patient taking differently: Take 3 mLs by nebulization every 6 (six) hours as needed (for wheezing/shortness of breath).  08/12/14   Cherene Altes, MD  ketoconazole (NIZORAL) 2 % shampoo Apply 1 application topically 2 (two) times a week. Leave on 5 minutes then rinse well. 10/06/15   Historical Provider, MD  levothyroxine  (SYNTHROID, LEVOTHROID) 125 MCG tablet Take 1 tablet (125 mcg total) by mouth daily before breakfast. 08/12/14   Cherene Altes, MD  lisinopril (PRINIVIL,ZESTRIL) 10 MG tablet Take 10 mg by mouth daily.    Historical Provider, MD  mirtazapine (REMERON) 15 MG tablet Take 15 mg by mouth at bedtime.    Historical Provider, MD  Multiple Vitamin (MULTIVITAMIN WITH MINERALS) TABS tablet Take 1 tablet by mouth daily.    Historical Provider, MD  omeprazole (PRILOSEC) 20 MG capsule Take 20 mg by mouth daily.    Historical Provider, MD  oxyCODONE (ROXICODONE) 5 MG immediate release tablet Take 1 tablet (5 mg total) by mouth every 8 (eight) hours as needed. 04/23/15 04/22/16  Victorino Dike, FNP  sertraline (ZOLOFT) 100 MG tablet Take 100 mg by mouth daily.    Historical Provider, MD     Vital Signs: BP (!) 138/112   Pulse 91   Resp 15   SpO2 98%   Physical Exam Awake Trach collar Abdomen soft, NTND Gtube site looks good Tube feeds not started yet.  Imaging: Ir Gastrostomy Tube Mod Sed  Result Date: 04/05/2016 CLINICAL DATA:  Respiratory failure and need for gastrostomy tube for nutrition. EXAM: PERCUTANEOUS GASTROSTOMY TUBE PLACEMENT ANESTHESIA/SEDATION: 2.0 mg IV Versed; 100 mcg IV Fentanyl. Total Moderate Sedation Time 10 minutes. CONTRAST:  25 mL Isovue-300 injected into the gastric lumen. MEDICATIONS: 1 g IV vancomycin. Vancomycin was given within two hours of incision. Vancomycin was given due to an antibiotic allergy. FLUOROSCOPY TIME:  1 minute and 54 seconds, 19  mGy. PROCEDURE: The procedure, risks, benefits, and alternatives were explained to the patient's brother who is power of attorney. Questions regarding the procedure were encouraged and answered. The patient's brother understands and consents to the procedure. A 5-French catheter was then advanced through the the patient's mouth under fluoroscopy into the esophagus and to the level of the stomach. This catheter was used to insufflate  the stomach with air under fluoroscopy. The abdominal wall was prepped with chlorhexidine in a sterile fashion, and a sterile drape was applied covering the operative field. A sterile gown and sterile gloves were used for the procedure. Local anesthesia was provided with 1% Lidocaine. A skin incision was made in the upper abdominal wall. Under fluoroscopy, an 18 gauge trocar needle was advanced into the stomach. Contrast injection was performed to confirm intraluminal position of the needle tip. A single T tack was then deployed in the lumen of the stomach. This was brought up to tension at the skin surface. Over a guidewire, a 9-French sheath was advanced into the lumen of the stomach. The wire was left in place as a safety wire. A loop snare device from a percutaneous gastrostomy kit was then advanced into the stomach. A floppy guide wire was advanced through the orogastric catheter under fluoroscopy in the stomach. The loop snare advanced through the percutaneous gastric access was used to snare the guide wire. This allowed withdrawal of the loop snare out of the patient's mouth by retraction of the orogastric catheter and wire. A 20-French bumper retention gastrostomy tube was looped around the snare device. It was then pulled back through the patient's mouth. The retention bumper was brought up to the anterior gastric wall. The T tack suture was cut at the skin. The exiting gastrostomy tube was cut to appropriate length and a feeding adapter applied. The catheter was injected with contrast material to confirm position and a fluoroscopic spot image saved. The tube was then flushed with saline. A dressing was applied over the gastrostomy exit site. COMPLICATIONS: None. FINDINGS: The stomach distended well with air allowing safe placement of the gastrostomy tube. After placement, the tip of the gastrostomy tube lies in the body of the stomach. IMPRESSION: Percutaneous gastrostomy with placement of a 20-French  bumper retention tube in the body of the stomach. This tube can be used for percutaneous feeds beginning in 24 hours after placement. Electronically Signed   By: Aletta Edouard M.D.   On: 04/05/2016 16:53   Dg Chest Port 1 View  Result Date: 04/06/2016 CLINICAL DATA:  70 year old male with fever EXAM: PORTABLE CHEST 1 VIEW COMPARISON:  Chest radiograph dated 03/31/2016 FINDINGS: There is shallow inspiration with mild eventration of the right hemidiaphragm. Bibasilar densities again noted which may represent atelectasis or infiltrate. A small right pleural effusion is noted. There is no pneumothorax. There is silhouetting of the cardiac borders. No acute osseous pathology. IMPRESSION: Shallow inspiration with bibasilar atelectasis versus infiltrate. Small right pleural effusion. Electronically Signed   By: Anner Crete M.D.   On: 04/06/2016 01:39    Labs:  CBC:  Recent Labs  03/27/16 0507 03/31/16 0515 04/05/16 1126 04/06/16 0103  WBC 5.4 8.6 8.5 18.3*  HGB 8.0* 8.9* 10.0* 10.0*  HCT 28.0* 30.9* 35.2* 35.3*  PLT 165 167 135* 131*    COAGS:  Recent Labs  07/22/15 1439 03/07/16 2333 03/10/16 0917 03/31/16 0515  INR 1.09  --  1.15 1.11  APTT 38* 31  --  44*    BMP:  Recent Labs  03/27/16 0507 03/31/16 0515 04/05/16 1126 04/06/16 0103  NA 137 138 142 140  K 4.3 4.1 4.1 3.8  CL 89* 83* 82* 82*  CO2 44* 46* 47* 47*  GLUCOSE 119* 108* 128* 134*  BUN 22* 30* 50* 48*  CALCIUM 8.8* 8.8* 9.4 9.0  CREATININE 0.75 0.78 1.01 1.19  GFRNONAA >60 >60 >60 >60  GFRAA >60 >60 >60 >60    LIVER FUNCTION TESTS:  Recent Labs  03/03/16 1140 03/10/16 0917 03/15/16 0914 04/05/16 1126  BILITOT 0.9 0.5 1.0 0.9  AST 25 18 18 26   ALT 14* 13* 16* 23  ALKPHOS 42 41 53 89  PROT 5.2* 5.3* 5.1* 7.1  ALBUMIN 2.7* 2.5* 2.3* 3.3*    Assessment and Plan:  Respiratory failure with tracheostomy and need for long term care.  S/P Percutaneous gastrostomy tube placement 04/05/2016  by Dr. Kathlene Cote  OK to begin tube feeds.  Routine Gtube care  Electronically Signed: Murrell Redden PA-C 04/06/2016, 8:59 AM   I spent a total of 15 Minutes at the the patient's bedside AND on the patient's hospital floor or unit, greater than 50% of which was counseling/coordinating care for f/u after Gtube.

## 2016-04-07 LAB — CULTURE, RESPIRATORY W GRAM STAIN: Culture: NORMAL

## 2016-04-07 LAB — CULTURE, RESPIRATORY

## 2016-04-08 LAB — BLOOD GAS, ARTERIAL
Acid-Base Excess: 24.6 mmol/L — ABNORMAL HIGH (ref 0.0–2.0)
BICARBONATE: 51.7 mmol/L — AB (ref 20.0–28.0)
FIO2: 45
O2 CONTENT: 25 L/min
O2 Saturation: 79 %
PH ART: 7.351 (ref 7.350–7.450)
PO2 ART: 50 mmHg — AB (ref 83.0–108.0)
Patient temperature: 98.6
pCO2 arterial: 95.8 mmHg (ref 32.0–48.0)

## 2016-04-08 LAB — CULTURE, RESPIRATORY W GRAM STAIN
Culture: NORMAL
Special Requests: NORMAL

## 2016-04-09 LAB — BASIC METABOLIC PANEL
Anion gap: 11 (ref 5–15)
BUN: 62 mg/dL — ABNORMAL HIGH (ref 6–20)
CALCIUM: 9.4 mg/dL (ref 8.9–10.3)
CO2: 49 mmol/L — ABNORMAL HIGH (ref 22–32)
CREATININE: 1.04 mg/dL (ref 0.61–1.24)
Chloride: 86 mmol/L — ABNORMAL LOW (ref 101–111)
Glucose, Bld: 121 mg/dL — ABNORMAL HIGH (ref 65–99)
Potassium: 5.5 mmol/L — ABNORMAL HIGH (ref 3.5–5.1)
SODIUM: 146 mmol/L — AB (ref 135–145)

## 2016-04-09 LAB — CBC
HCT: 37.5 % — ABNORMAL LOW (ref 39.0–52.0)
Hemoglobin: 10.4 g/dL — ABNORMAL LOW (ref 13.0–17.0)
MCH: 28.2 pg (ref 26.0–34.0)
MCHC: 27.7 g/dL — AB (ref 30.0–36.0)
MCV: 101.6 fL — ABNORMAL HIGH (ref 78.0–100.0)
PLATELETS: 137 10*3/uL — AB (ref 150–400)
RBC: 3.69 MIL/uL — ABNORMAL LOW (ref 4.22–5.81)
RDW: 18 % — AB (ref 11.5–15.5)
WBC: 15.7 10*3/uL — AB (ref 4.0–10.5)

## 2016-04-09 LAB — URINE CULTURE
Culture: >=100000 — AB
Special Requests: NORMAL

## 2016-04-11 LAB — CULTURE, BLOOD (ROUTINE X 2)
Culture: NO GROWTH
Culture: NO GROWTH

## 2016-04-11 LAB — BASIC METABOLIC PANEL
Anion gap: 10 (ref 5–15)
BUN: 65 mg/dL — ABNORMAL HIGH (ref 6–20)
CALCIUM: 9.7 mg/dL (ref 8.9–10.3)
CO2: 47 mmol/L — AB (ref 22–32)
CREATININE: 1.14 mg/dL (ref 0.61–1.24)
Chloride: 95 mmol/L — ABNORMAL LOW (ref 101–111)
GLUCOSE: 166 mg/dL — AB (ref 65–99)
Potassium: 4.3 mmol/L (ref 3.5–5.1)
Sodium: 152 mmol/L — ABNORMAL HIGH (ref 135–145)

## 2016-04-11 LAB — CBC
HEMATOCRIT: 40.8 % (ref 39.0–52.0)
Hemoglobin: 11.4 g/dL — ABNORMAL LOW (ref 13.0–17.0)
MCH: 29.1 pg (ref 26.0–34.0)
MCHC: 27.9 g/dL — AB (ref 30.0–36.0)
MCV: 104.1 fL — ABNORMAL HIGH (ref 78.0–100.0)
PLATELETS: 141 10*3/uL — AB (ref 150–400)
RBC: 3.92 MIL/uL — ABNORMAL LOW (ref 4.22–5.81)
RDW: 18.3 % — AB (ref 11.5–15.5)
WBC: 16.5 10*3/uL — ABNORMAL HIGH (ref 4.0–10.5)

## 2016-04-11 IMAGING — CR DG CHEST 1V PORT
1 series · 1 of 1 positions shown · non-contrast
Comparison: Portable exam 1191 hr compared to 08/06/2014

CLINICAL DATA: Right-side lung congestion, dry cough, COPD
exacerbation

EXAM:
PORTABLE CHEST - 1 VIEW

[AP]
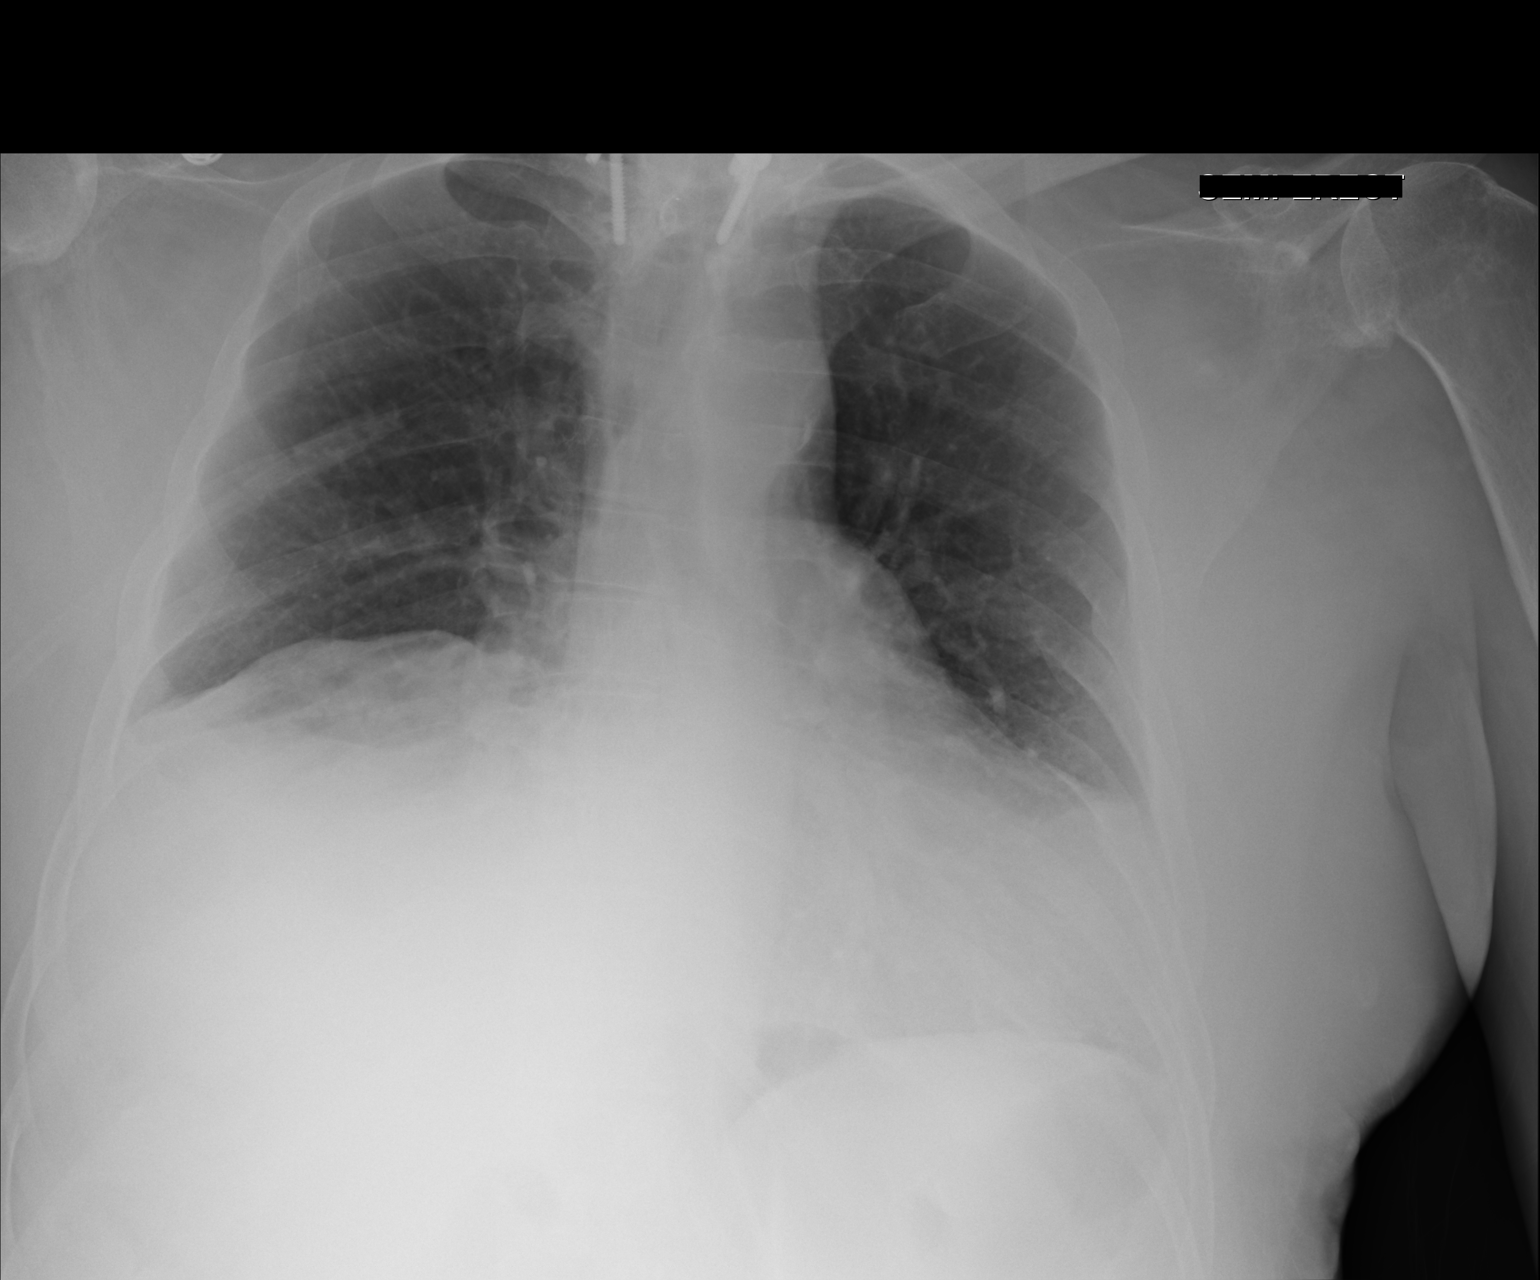

[1 of 1 positions shown; findings below may reference images not displayed]

FINDINGS: Enlargement of cardiac silhouette.

Mediastinal contours and pulmonary vascularity normal.

Chronic elevation of RIGHT diaphragm with bowel interposition.

Mild RIGHT basilar atelectasis.

Remaining lungs clear.

No pleural effusion or pneumothorax.

Prior cervicothoracic fusion.

Atherosclerotic calcification aorta.
IMPRESSION: Chronic elevation of RIGHT diaphragm with mild RIGHT basilar
atelectasis.

Enlargement of cardiac silhouette.

## 2016-04-12 ENCOUNTER — Other Ambulatory Visit (HOSPITAL_COMMUNITY): Payer: Self-pay

## 2016-04-13 ENCOUNTER — Encounter: Payer: Self-pay | Admitting: Adult Health

## 2016-04-13 ENCOUNTER — Non-Acute Institutional Stay (SKILLED_NURSING_FACILITY): Payer: Medicare Other | Admitting: Adult Health

## 2016-04-13 DIAGNOSIS — J9622 Acute and chronic respiratory failure with hypercapnia: Secondary | ICD-10-CM

## 2016-04-13 DIAGNOSIS — R600 Localized edema: Secondary | ICD-10-CM | POA: Diagnosis not present

## 2016-04-13 DIAGNOSIS — K219 Gastro-esophageal reflux disease without esophagitis: Secondary | ICD-10-CM

## 2016-04-13 DIAGNOSIS — J189 Pneumonia, unspecified organism: Secondary | ICD-10-CM | POA: Diagnosis not present

## 2016-04-13 DIAGNOSIS — J441 Chronic obstructive pulmonary disease with (acute) exacerbation: Secondary | ICD-10-CM | POA: Diagnosis not present

## 2016-04-13 DIAGNOSIS — K5901 Slow transit constipation: Secondary | ICD-10-CM

## 2016-04-13 DIAGNOSIS — F418 Other specified anxiety disorders: Secondary | ICD-10-CM | POA: Diagnosis not present

## 2016-04-13 LAB — CBC AND DIFFERENTIAL
HCT: 35 % — AB (ref 41–53)
Hemoglobin: 10.5 g/dL — AB (ref 13.5–17.5)
Platelets: 107 10*3/uL — AB (ref 150–399)
WBC: 19.6 10^3/mL

## 2016-04-13 LAB — BASIC METABOLIC PANEL
BUN: 52 mg/dL — AB (ref 4–21)
CREATININE: 1 mg/dL (ref 0.6–1.3)
GLUCOSE: 109 mg/dL
POTASSIUM: 4.1 mmol/L (ref 3.4–5.3)
SODIUM: 147 mmol/L (ref 137–147)

## 2016-04-13 NOTE — Progress Notes (Signed)
Patient ID: Chad Huffman, male   DOB: 01-Jan-1946, 70 y.o.   MRN: [id number]   Location:   [facility] Room Number: 123-A Place of Service:  SNF (31)   CODE STATUS: DNR  Allergies  Allergen Reactions  . Ampicillin Swelling and Other (See Comments)  . Nortriptyline Other (See Comments)    Sleep walking  . Brimonidine Other (See Comments)    Reaction:  Unknown   . Cyclobenzaprine Other (See Comments)    Reaction: Hallucinations  . Tizanidine Other (See Comments)    Reaction: Hallucinations  . Gabapentin Rash    Chief Complaint  Patient presents with  . Hospitalization Follow-up    Hospital Follow up    HPI:  He has had a prolonged hospitalization for acute on chronic respiratory failure; copd; vre; uti pneumonia. He has required a trach and peg tube placement. He is wanting to to start eating. He is not voicing any other complaints. There are no nursing concerns at this time. He is here for short term rehab .,   Past Medical History:  Diagnosis Date  . Antalgic gait   . Carotid artery occlusion   . Chronic neck and back pain   . Complication of anesthesia    pt has had cervical fusion-limited neck flextion  . COPD (chronic obstructive pulmonary disease) (HCC)   . Depression   . DJD (degenerative joint disease)    Right shoulder  . Hypertension   . Lumbago   . Peripheral neuropathy [facility])     Past Surgical History:  Procedure Laterality Date  . ABDOMINAL AORTIC ANEURYSM REPAIR  2009   Endovascular AAA repair  . ABDOMINAL AORTIC ANEURYSM REPAIR    . CARPAL TUNNEL RELEASE     bilateral CTS  . HAMMER TOE SURGERY  12/10/2011   Procedure: HAMMER TOE CORRECTION;  Surgeon: Ernestene Kiel, DPM;  Location: [facility];  Service: Podiatry;  Laterality: Right;  Right hammertoe repair second right with 2.5x42 orthopro screw   . IR GENERIC HISTORICAL  04/05/2016   IR GASTROSTOMY TUBE MOD SED 04/05/2016 [facility]-INTERV RAD  . METATARSAL OSTEOTOMY  12/10/2011   Procedure: METATARSAL OSTEOTOMY;  Surgeon: Ernestene Kiel, DPM;  Location: [facility];  Service: Podiatry;  Laterality: Right;  Right Keller arthroplasty with size 5 silicone implant right great toe; second metatarsal osteotomy with 2.0x14 screw  . NECK SURGERY    . PERIPHERAL VASCULAR CATHETERIZATION N/A 02/11/2015   Procedure: Carotid Angiography;  Surgeon: Nada Libman, MD;  Location: [facility] CV [facility];  Service: Cardiovascular;  Laterality: N/A;  . PILONIDAL CYST / SINUS EXCISION    . Rotary cuff    . SPINE SURGERY  09/15/11   Scar tissue removed- back  . SPINE SURGERY  09/15/11   Scar tissue removed- back  . TONSILLECTOMY    . TRACHEOSTOMY TUBE PLACEMENT N/A 03/03/2016   Procedure: TRACHEOSTOMY;  Surgeon: Geanie Logan, MD;  Location: [facility] ORS;  Service: ENT;  Laterality: N/A;    Social History   Social History  . Marital status: Single    Spouse name: N/A  . Number of children: N/A  . Years of education: N/A   Occupational History  . Not on file.   Social History Main Topics  . Smoking status: Former Smoker    Quit date: 08/30/2006  . Smokeless tobacco: Never Used  . Alcohol use 1.2 oz/week    2 Cans of beer per week     Comment: occasional use  .  Drug use: No  . Sexual activity: Not on file   Other Topics Concern  . Not on file   Social History Narrative  . No narrative on file   Family History  Problem Relation Age of Onset  . Cancer Mother     Stomach  . Hypertension Brother   . Heart attack Father       VITAL SIGNS BP 125/73   Pulse 69   Temp 98.1 F (36.7 C) (Oral)   Resp 18   SpO2 98%   Patient's Medications  New Prescriptions   No medications on file  Previous Medications   BUDESONIDE (PULMICORT) 0.5 MG/2ML NEBULIZER SOLUTION    Take 0.5 mg by nebulization 2 (two) times daily.   CIPROFLOXACIN (CIPRO) 500 MG TABLET    Place 500 mg into feeding tube 2 (two) times daily.   CLONAZEPAM (KLONOPIN) 0.5 MG TABLET    Take 0.5 mg by mouth 2  (two) times daily.   DOCUSATE SODIUM (COLACE) 100 MG CAPSULE    100 mg 2 (two) times daily.   ENOXAPARIN (LOVENOX) 40 MG/0.4ML INJECTION    Inject 40 mg into the skin daily.   FAMOTIDINE (PEPCID AC) 10 MG CHEWABLE TABLET    Place 20 mg into feeding tube 2 (two) times daily.   FOLIC ACID (FOLVITE) 1 MG TABLET    Take 1 mg by mouth daily.   IPRATROPIUM-ALBUTEROL (DUONEB) 0.5-2.5 (3) MG/3ML SOLN    Take 3 mLs by nebulization every 6 (six) hours.   MULTIPLE VITAMIN (MULTIVITAMIN WITH MINERALS) TABS TABLET    Take 1 tablet by mouth daily.   OXYCODONE (ROXICODONE) 5 MG IMMEDIATE RELEASE TABLET    Take 1 tablet (5 mg total) by mouth every 8 (eight) hours as needed.   PREDNISOLONE 5 MG TABS TABLET    Place 5 mg into feeding tube daily.   PREDNISONE PO    Place into feeding tube. 40 mg x 3 days, 30 mg x 3 days, 20 mg x 3 days, 10 mg x 3 days   QUETIAPINE FUMARATE (SEROQUEL XR) 150 MG 24 HR TABLET    150 mg at bedtime.   SCOPOLAMINE (TRANSDERM-SCOP) 1 MG/3DAYS    Place 1.5 patches onto the skin every 3 (three) days.   SERTRALINE HCL (ZOLOFT PO)    Place 75 mg into feeding tube daily.   TORSEMIDE (DEMADEX) 20 MG TABLET    Take 20 mg by mouth daily.  Modified Medications   No medications on file  Discontinued Medications    SIGNIFICANT DIAGNOSTIC EXAMS  04-06-16: chest x-ray: Shallow inspiration with bibasilar atelectasis versus infiltrate. Small right pleural effusion.    LABS REVIEWED:   03-10-16: tsh 6.972; hgb a1c 5.4 04-11-16: wbc 16.5; hgb 11.4; hct 40.8; mcv 104.1; plt 141; glucose 166; bun 65; creat 1.14; k+ 4.3; na++ 152 04-13-16: wbc 19.6; hgb 10.5; hct 34.9; mcv 102.5; plt 107; glucose 109; bun 52.1; creat 0.97; k+ 4.1; na++ 147     Review of Systems  Constitutional: Negative for malaise/fatigue.  Respiratory: Negative for cough and shortness of breath.   Cardiovascular: Negative for chest pain, palpitations and leg swelling.  Gastrointestinal: Negative for abdominal pain,  constipation and heartburn.  Musculoskeletal: Negative for back pain, joint pain and myalgias.  Skin: Negative.   Neurological: Negative for dizziness.  Psychiatric/Behavioral: The patient is not nervous/anxious.    Physical Exam  Constitutional: No distress.  Eyes: Conjunctivae are normal.  Neck: Neck supple. No JVD present. No thyromegaly present.  Cardiovascular: Normal rate, regular rhythm and intact distal pulses.   Respiratory: Effort normal and breath sounds normal. No respiratory distress. He has no wheezes.  Janina Mayo is capped   GI: Soft. Bowel sounds are normal. He exhibits no distension. There is no tenderness.  Peg tube present   Musculoskeletal: He exhibits no edema.  Able to move all extremities   Lymphadenopathy:    He has no cervical adenopathy.  Neurological: He is alert.  Skin: Skin is warm and dry. He is not diaphoretic.  Psychiatric: He has a normal mood and affect.      ASSESSMENT/ PLAN:  1. Chronic respiratory failure with COPD: will continue pulmicort neb twice daily; duoneb every 6 hours; is on prednisone taper with 5 mg daily long term  2. Dysphagia: does have peg tube; no signs of aspiration present; will continue current plan of care; does have scopolamine patch for excessive oral secretions  3. gerd: will continue pepcid 20 mg twice daily  4. Depression with anxiety: will continue klonopin 0.5 mg twice daily and zoloft 75 mg daily and will continue seroquel xr 150 mg daily  5. Bilateral lower extremity edema: will continue demadex 20 mg daily   6. Constipation: will continue colace twice daily   7. Pneumonia: will complete cipro and prednisone taper will monitor    Time spent with patient  50  minutes >50% time spent counseling; reviewing medical record; tests; labs; and developing future plan of care   MD is aware of resident's narcotic use and is in agreement with current plan of care. We will attempt to wean resident as apropriate   Synthia Innocent NP [facility]  Contact [phone] Monday through Friday 8am- 5pm  After hours call [phone]

## 2016-04-15 IMAGING — CR DG ABDOMEN 3V
1 series · 4 of 4 positions shown · non-contrast
Comparison: Two-view chest x-ray 08/04/2014

CLINICAL DATA: Constipation.  Abdominal pain.

EXAM:
ABDOMEN SERIES

[Series 1: x chest ap · 0.14mm/px · 4 of 4 slices shown]
[im 1/4]
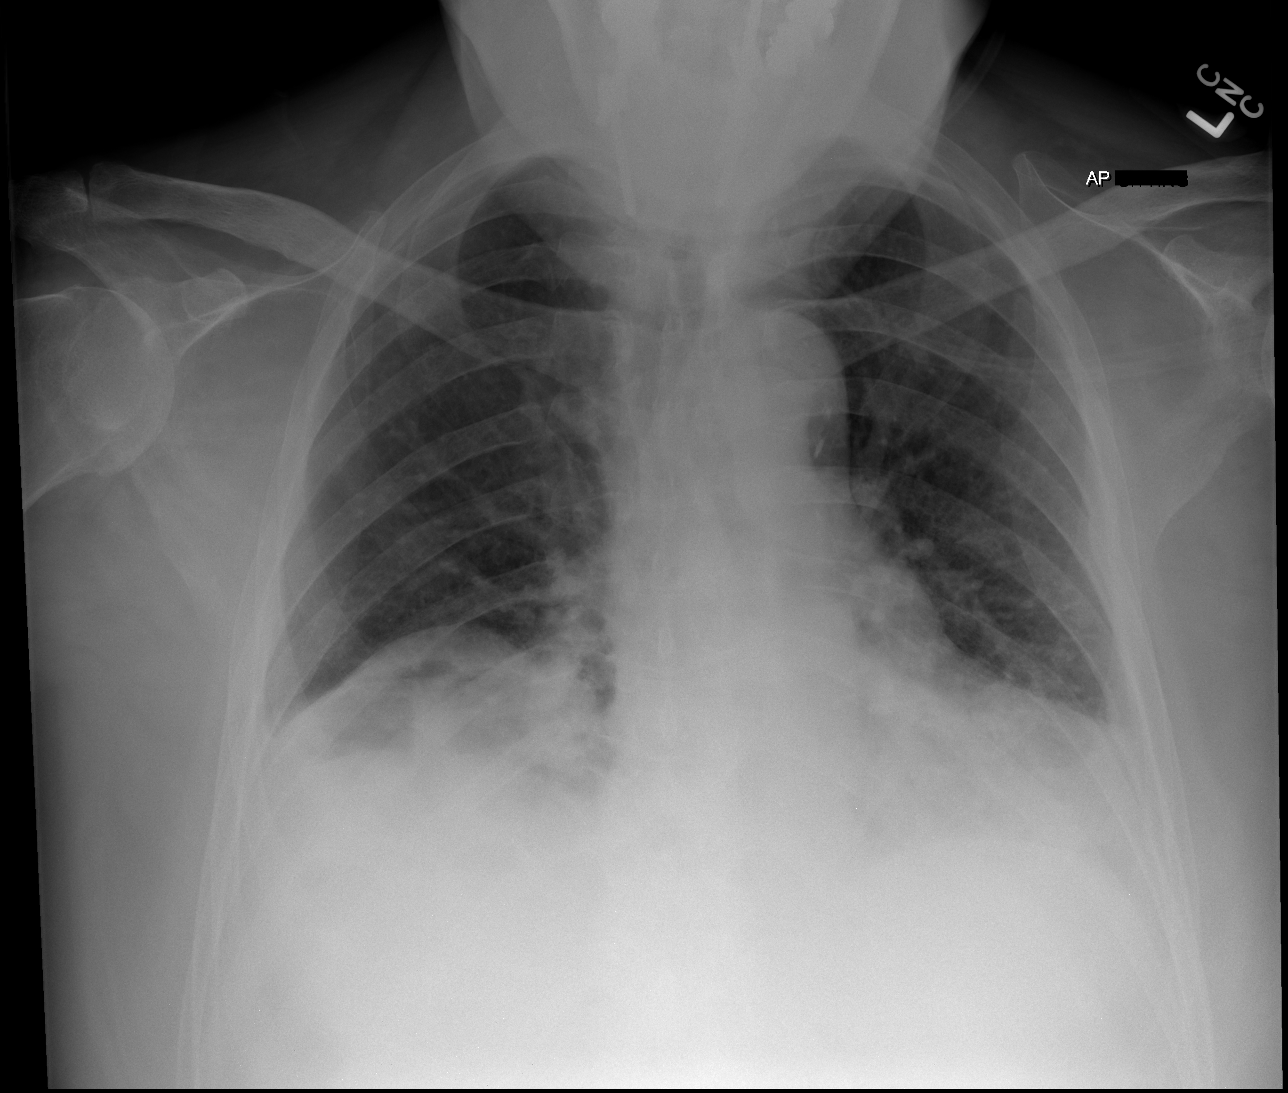
[im 2/4]
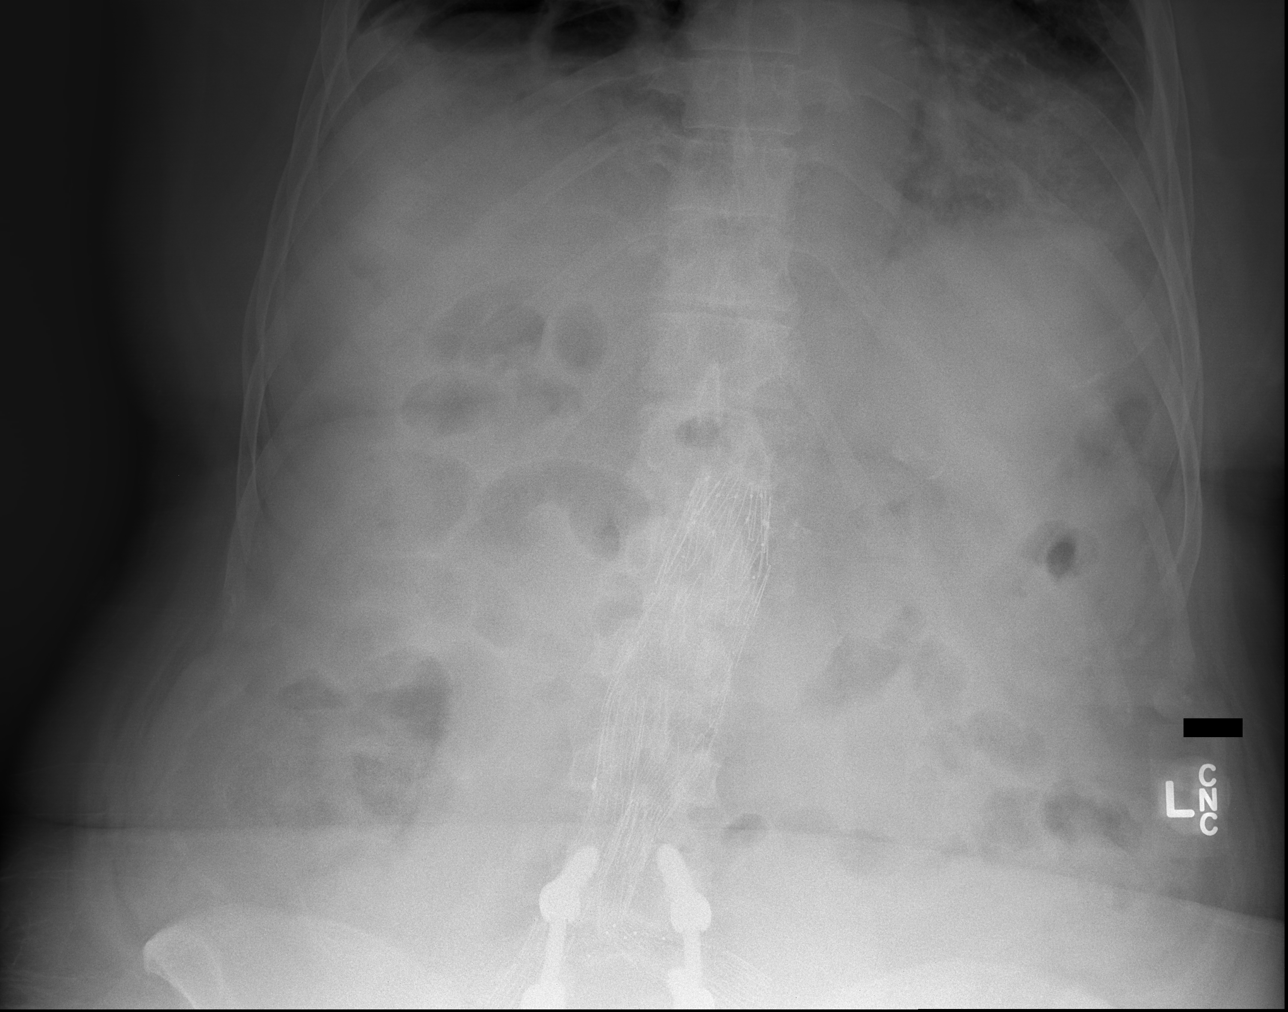
[im 3/4]
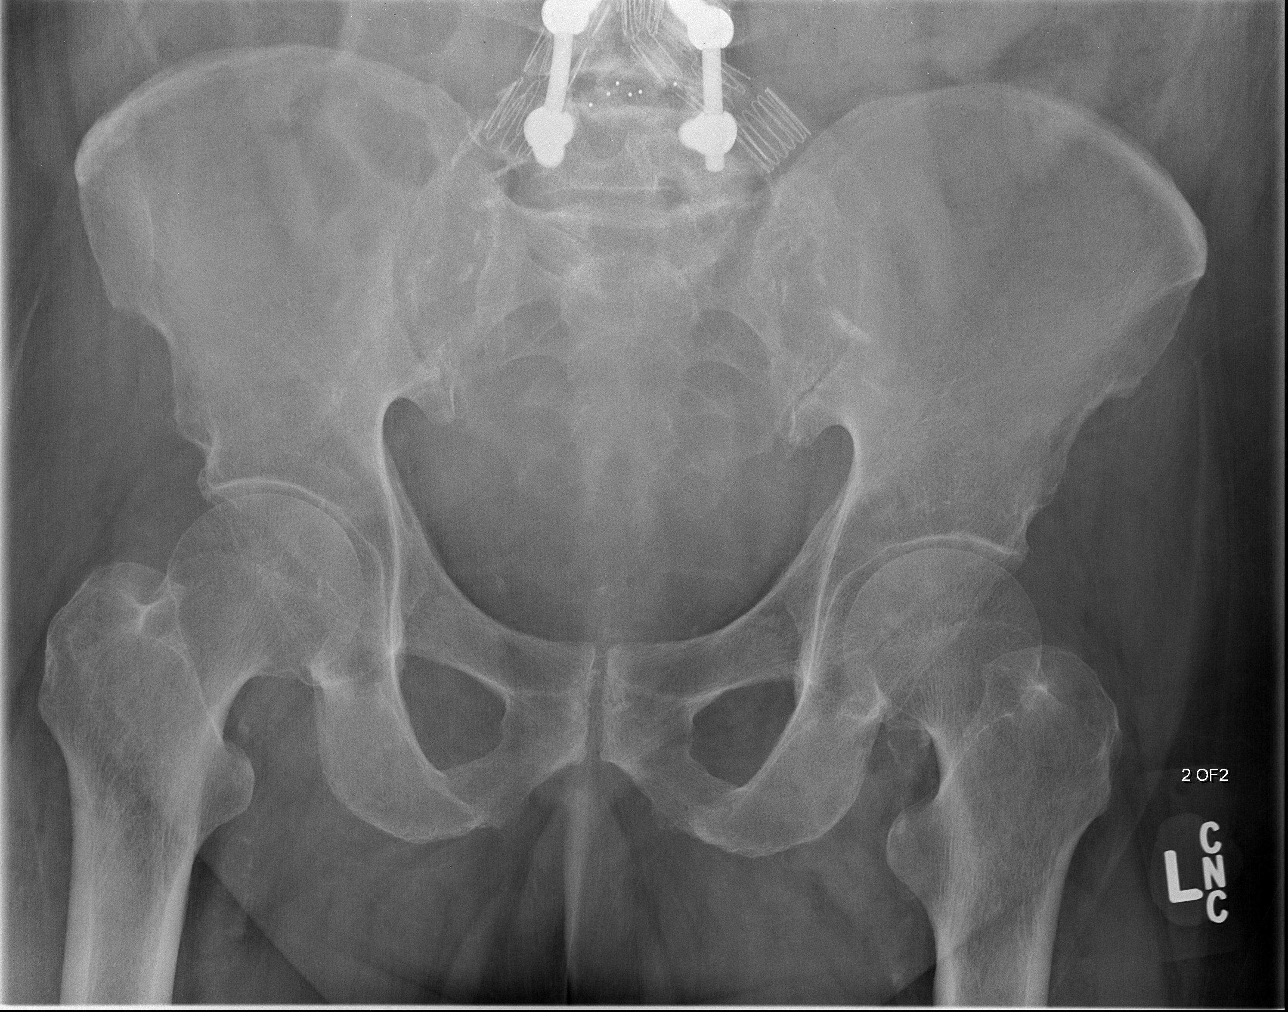
[im 4/4]
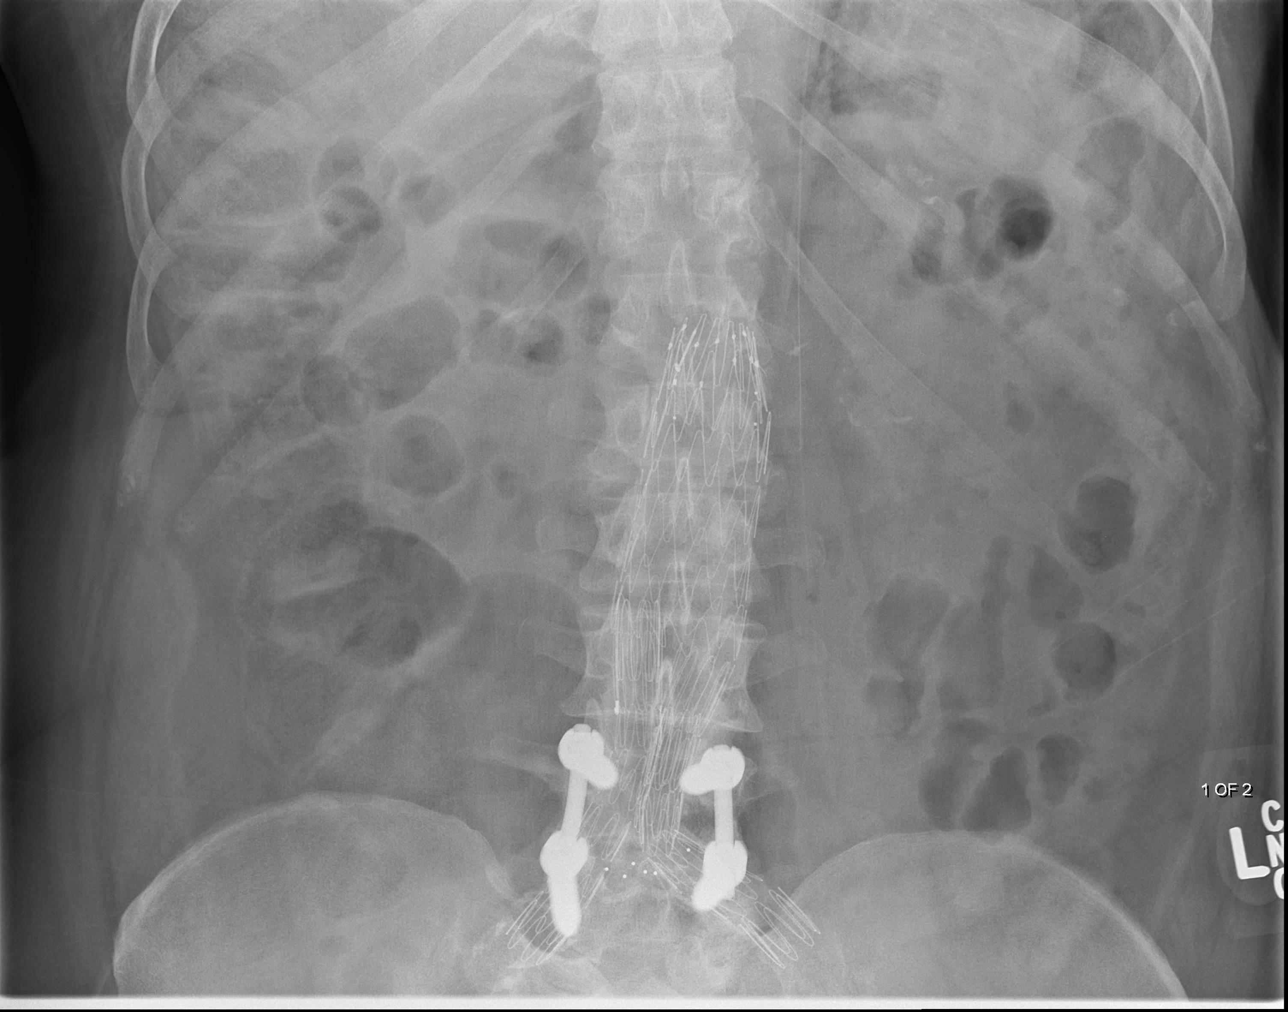

[4 of 4 positions shown; findings below may reference images not displayed]

FINDINGS: The heart is enlarged. Atherosclerotic calcifications are present at
the arch. Bibasilar airspace disease and effusions are noted.

Bowel gas pattern is unremarkable. There is no evidence for
obstruction or free air. Aorta iliac stent graft is present.
Postsurgical changes are noted in the lower lumbar spine.
IMPRESSION: 1. Cardiomegaly with small effusions and bibasilar atelectasis. This
may represent early congestive heart failure.
2. Normal bowel gas pattern without evidence for obstruction or free
air.

## 2016-04-16 ENCOUNTER — Emergency Department (HOSPITAL_COMMUNITY): Payer: Medicare Other

## 2016-04-16 ENCOUNTER — Encounter (HOSPITAL_COMMUNITY): Payer: Self-pay

## 2016-04-16 ENCOUNTER — Emergency Department (HOSPITAL_COMMUNITY)
Admission: EM | Admit: 2016-04-16 | Discharge: 2016-04-16 | Disposition: A | Discharge status: Home / Self Care | Payer: Medicare Other | Attending: Emergency Medicine | Admitting: Emergency Medicine

## 2016-04-16 DIAGNOSIS — J441 Chronic obstructive pulmonary disease with (acute) exacerbation: Secondary | ICD-10-CM | POA: Diagnosis not present

## 2016-04-16 DIAGNOSIS — I10 Essential (primary) hypertension: Secondary | ICD-10-CM | POA: Insufficient documentation

## 2016-04-16 DIAGNOSIS — Z930 Tracheostomy status: Secondary | ICD-10-CM

## 2016-04-16 DIAGNOSIS — G9340 Encephalopathy, unspecified: Secondary | ICD-10-CM | POA: Insufficient documentation

## 2016-04-16 DIAGNOSIS — Z87891 Personal history of nicotine dependence: Secondary | ICD-10-CM | POA: Diagnosis not present

## 2016-04-16 DIAGNOSIS — Z79899 Other long term (current) drug therapy: Secondary | ICD-10-CM | POA: Insufficient documentation

## 2016-04-16 DIAGNOSIS — R4182 Altered mental status, unspecified: Secondary | ICD-10-CM | POA: Diagnosis present

## 2016-04-16 LAB — URINALYSIS, ROUTINE W REFLEX MICROSCOPIC
BILIRUBIN URINE: NEGATIVE
Glucose, UA: NEGATIVE mg/dL
HGB URINE DIPSTICK: NEGATIVE
Ketones, ur: NEGATIVE mg/dL
Leukocytes, UA: NEGATIVE
Nitrite: NEGATIVE
PH: 7 (ref 5.0–8.0)
Protein, ur: NEGATIVE mg/dL
SPECIFIC GRAVITY, URINE: 1.017 (ref 1.005–1.030)

## 2016-04-16 LAB — CBC WITH DIFFERENTIAL/PLATELET
Basophils Absolute: 0 10*3/uL (ref 0.0–0.1)
Basophils Relative: 0 %
EOS ABS: 1.2 10*3/uL — AB (ref 0.0–0.7)
EOS PCT: 8 %
HCT: 32.5 % — ABNORMAL LOW (ref 39.0–52.0)
HEMOGLOBIN: 9.9 g/dL — AB (ref 13.0–17.0)
LYMPHS ABS: 1.8 10*3/uL (ref 0.7–4.0)
Lymphocytes Relative: 12 %
MCH: 29.3 pg (ref 26.0–34.0)
MCHC: 30.5 g/dL (ref 30.0–36.0)
MCV: 96.2 fL (ref 78.0–100.0)
MONO ABS: 0.9 10*3/uL (ref 0.1–1.0)
Monocytes Relative: 6 %
Neutro Abs: 11.3 10*3/uL — ABNORMAL HIGH (ref 1.7–7.7)
Neutrophils Relative %: 74 %
Platelets: 122 10*3/uL — ABNORMAL LOW (ref 150–400)
RBC: 3.38 MIL/uL — AB (ref 4.22–5.81)
RDW: 17.8 % — ABNORMAL HIGH (ref 11.5–15.5)
WBC: 15.2 10*3/uL — AB (ref 4.0–10.5)

## 2016-04-16 LAB — BASIC METABOLIC PANEL
Anion gap: 7 (ref 5–15)
BUN: 40 mg/dL — AB (ref 6–20)
CALCIUM: 8.6 mg/dL — AB (ref 8.9–10.3)
CHLORIDE: 89 mmol/L — AB (ref 101–111)
CO2: 39 mmol/L — ABNORMAL HIGH (ref 22–32)
CREATININE: 0.98 mg/dL (ref 0.61–1.24)
GFR calc Af Amer: >60 mL/min (ref >60)
Glucose, Bld: 102 mg/dL — ABNORMAL HIGH (ref 65–99)
Potassium: 3.8 mmol/L (ref 3.5–5.1)
SODIUM: 135 mmol/L (ref 135–145)

## 2016-04-16 LAB — TROPONIN I

## 2016-04-16 MED ORDER — ALBUTEROL SULFATE (2.5 MG/3ML) 0.083% IN NEBU
5.0000 mg | INHALATION_SOLUTION | Freq: Once | RESPIRATORY_TRACT | Status: AC
  Administered 2016-04-16: 5 mg via RESPIRATORY_TRACT
  Filled 2016-04-16: qty 6

## 2016-04-16 NOTE — ED Triage Notes (Signed)
Pt on lovenox.

## 2016-04-16 NOTE — Progress Notes (Signed)
Rt placed patient on trach collar 40%. Patients O2 stas were 82% on room air. Patient sats are now 99%. RT will continue to monitor.

## 2016-04-16 NOTE — ED Notes (Signed)
Bed: [id number] Expected date:  Expected time:  Means of arrival:  Comments: EMS 70 yo male trach altered mental status

## 2016-04-16 NOTE — ED Notes (Signed)
EDP Wickline at bedside. 

## 2016-04-16 NOTE — ED Provider Notes (Signed)
[facility]-EMERGENCY DEPT Provider Note   CSN: [id number] Arrival date & time: 04/16/16  0230  By signing my name below, I, Majel Homer, attest that this documentation has been prepared under the direction and in the presence of Zadie Rhine, MD . Electronically Signed: Majel Homer, Scribe. 04/16/2016. 3:02 AM.  History   Chief Complaint Chief Complaint  Patient presents with  . Fall  . Altered Mental Status   The history is provided by the patient. No language interpreter was used.   LEVEL 5 CAVEAT and Limited ROS due to Acuity of Condition   HPI Comments: Chad Huffman is a 70 y.o. male with PMHx of HTN, trach dependence brought in by EMS to the Emergency Department from [facility] for an evaluation s/p a fall this evening. Per nurse, pt fell twice at his facility today; however, he can only recall the first fall that occurred earlier today. Per facility, pt is slightly more altered than his baseline. Pt denies headache, chest pain, shortness of breath and abdominal pain.   Past Medical History:  Diagnosis Date  . Antalgic gait   . Carotid artery occlusion   . Chronic neck and back pain   . Complication of anesthesia    pt has had cervical fusion-limited neck flextion  . COPD (chronic obstructive pulmonary disease) (HCC)   . Depression   . DJD (degenerative joint disease)    Right shoulder  . Hypertension   . Lumbago   . Peripheral neuropathy [facility])     Patient Active Problem List   Diagnosis Date Noted  . Respiratory failure (HCC) 03/03/2016  . Respiratory failure with hypoxia and hypercapnia (HCC) 07/22/2015  . Cervical vertebral fusion   . History of lumbar laminectomy   . Chronic respiratory acidosis   . Depression   . Chronic neck and back pain   . Acute on chronic respiratory failure with hypercapnia (HCC)   . Acute on chronic respiratory failure with hypoxia (HCC)   . COPD exacerbation (HCC)   . HCAP (healthcare-associated  pneumonia)   . Congestive dilated cardiomyopathy (HCC)   . Syncope and collapse   . Hyponatremia   . Other specified hypothyroidism   . Acute respiratory failure with hypoxia and hypercapnia (HCC) 08/04/2014  . S/P cervical spinal fusion 08/04/2014  . S/P lumbar laminectomy 08/04/2014  . Right shoulder pain 08/04/2014  . Arthritis of right hip 08/04/2014  . Essential hypertension 08/04/2014  . Hypothyroidism 08/04/2014  . Syncope 08/04/2014  . Fall 08/04/2014  . Low back pain 08/04/2014  . Pedal edema 08/04/2014  . Occlusion and stenosis of carotid artery without mention of cerebral infarction 12/10/2013  . Aftercare following surgery of the circulatory system, NEC 12/10/2013  . Pain of right upper extremity-Right Neck-Shoulder-Back 12/10/2013  . Abdominal aneurysm without mention of rupture 12/03/2011    Past Surgical History:  Procedure Laterality Date  . ABDOMINAL AORTIC ANEURYSM REPAIR  2009   Endovascular AAA repair  . ABDOMINAL AORTIC ANEURYSM REPAIR    . CARPAL TUNNEL RELEASE     bilateral CTS  . HAMMER TOE SURGERY  12/10/2011   Procedure: HAMMER TOE CORRECTION;  Surgeon: Ernestene Kiel, DPM;  Location: [facility];  Service: Podiatry;  Laterality: Right;  Right hammertoe repair second right with 2.5x42 orthopro screw   . IR GENERIC HISTORICAL  04/05/2016   IR GASTROSTOMY TUBE MOD SED 04/05/2016 [facility]-INTERV RAD  . METATARSAL OSTEOTOMY  12/10/2011   Procedure: METATARSAL OSTEOTOMY;  Surgeon: Ernestene Kiel,  DPM;  Location: [facility];  Service: Podiatry;  Laterality: Right;  Right Keller arthroplasty with size 5 silicone implant right great toe; second metatarsal osteotomy with 2.0x14 screw  . NECK SURGERY    . PERIPHERAL VASCULAR CATHETERIZATION N/A 02/11/2015   Procedure: Carotid Angiography;  Surgeon: Nada LibmanVance W Brabham, MD;  [facility];  Service: Cardiovascular;  Laterality: N/A;  . PILONIDAL CYST / SINUS EXCISION    . Rotary cuff    .  SPINE SURGERY  09/15/11   Scar tissue removed- back  . SPINE SURGERY  09/15/11   Scar tissue removed- back  . TONSILLECTOMY    . TRACHEOSTOMY TUBE PLACEMENT N/A 03/03/2016   Procedure: TRACHEOSTOMY;  Surgeon: Geanie LoganPaul Bennett, MD;  Location: [facility] ORS;  Service: ENT;  Laterality: N/A;    Home Medications    Prior to Admission medications   Medication Sig Start Date End Date Taking? Authorizing Provider  budesonide (PULMICORT) 0.5 MG/2ML nebulizer solution Take 0.5 mg by nebulization 2 (two) times daily.    Historical Provider, MD  ciprofloxacin (CIPRO) 500 MG tablet Place 500 mg into feeding tube 2 (two) times daily.    Historical Provider, MD  clonazePAM (KLONOPIN) 0.5 MG tablet Take 0.5 mg by mouth 2 (two) times daily.    Historical Provider, MD  docusate sodium (COLACE) 100 MG capsule 100 mg 2 (two) times daily.    Historical Provider, MD  enoxaparin (LOVENOX) 40 MG/0.4ML injection Inject 40 mg into the skin daily.    Historical Provider, MD  famotidine (PEPCID AC) 10 MG chewable tablet Place 20 mg into feeding tube 2 (two) times daily.    Historical Provider, MD  folic acid (FOLVITE) 1 MG tablet Take 1 mg by mouth daily.    Historical Provider, MD  ipratropium-albuterol (DUONEB) 0.5-2.5 (3) MG/3ML SOLN Take 3 mLs by nebulization every 6 (six) hours. Patient taking differently: Take 3 mLs by nebulization every 6 (six) hours as needed (for wheezing/shortness of breath).  08/12/14   Lonia BloodJeffrey T McClung, MD  Multiple Vitamin (MULTIVITAMIN WITH MINERALS) TABS tablet Take 1 tablet by mouth daily.    Historical Provider, MD  oxyCODONE (ROXICODONE) 5 MG immediate release tablet Take 1 tablet (5 mg total) by mouth every 8 (eight) hours as needed. Patient taking differently: Place 5 mg into feeding tube every 8 (eight) hours as needed.  04/23/15 04/22/16  Chinita Pesterari B Triplett, FNP  prednisoLONE 5 MG TABS tablet Place 5 mg into feeding tube daily. 04/12/16 04/28/16  Historical Provider, MD  PREDNISONE PO Place into  feeding tube. 40 mg x 3 days, 30 mg x 3 days, 20 mg x 3 days, 10 mg x 3 days 04/13/16 04/27/16  Historical Provider, MD  QUEtiapine Fumarate (SEROQUEL XR) 150 MG 24 hr tablet 150 mg at bedtime.    Historical Provider, MD  scopolamine (TRANSDERM-SCOP) 1 MG/3DAYS Place 1.5 patches onto the skin every 3 (three) days. 04/13/16 04/19/16  Historical Provider, MD  Sertraline HCl (ZOLOFT PO) Place 75 mg into feeding tube daily.    Historical Provider, MD  torsemide (DEMADEX) 20 MG tablet Take 20 mg by mouth daily.    Historical Provider, MD    Family History Family History  Problem Relation Age of Onset  . Cancer Mother     Stomach  . Hypertension Brother   . Heart attack Father     Social History Social History  Substance Use Topics  . Smoking status: Former Smoker    Quit date: 08/30/2006  .  Smokeless tobacco: Never Used  . Alcohol use 1.2 oz/week    2 Cans of beer per week     Comment: occasional use   Allergies   Ampicillin; Nortriptyline; Brimonidine; Cyclobenzaprine; Tizanidine; and Gabapentin  Review of Systems Review of Systems  Unable to perform ROS: Acuity of condition   Physical Exam Updated Vital Signs BP 119/73 (BP Location: Right Arm)   Pulse 84   Temp 98.6 F (37 C) (Oral)   Resp 18   Ht 5\' 11"  (1.803 m)   Wt 241 lb (109.3 kg)   SpO2 (!) 84%   BMI 33.61 kg/m   Physical Exam CONSTITUTIONAL: elderly, chronically ill appearing HEAD: Normocephalic/atraumatic EYES: EOMI/PERRL ENMT: Mucous membranes moist NECK: supple no meningeal signs, trach in place  SPINE/BACK:entire spine nontender CV: S1/S2 noted, no murmurs/rubs/gallops noted LUNGS: coarse breath sounds bilaterally  ABDOMEN: soft, nontender, PEG tube noted GU:no cva tenderness NEURO: Pt is awake/alert/appropriate EXTREMITIES: pulses normal/equal, no deformities noted, no tenderness noted SKIN: warm, color normal  ED Treatments / Results  Labs (all labs ordered are listed, but only abnormal results  are displayed) Labs Reviewed  BASIC METABOLIC PANEL - Abnormal; Notable for the following:       Result Value   Chloride 89 (*)    CO2 39 (*)    Glucose, Bld 102 (*)    BUN 40 (*)    Calcium 8.6 (*)    All other components within normal limits  CBC WITH DIFFERENTIAL/PLATELET - Abnormal; Notable for the following:    WBC 15.2 (*)    RBC 3.38 (*)    Hemoglobin 9.9 (*)    HCT 32.5 (*)    RDW 17.8 (*)    Platelets 122 (*)    Neutro Abs 11.3 (*)    Eosinophils Absolute 1.2 (*)    All other components within normal limits  URINALYSIS, ROUTINE W REFLEX MICROSCOPIC (NOT AT [facility])  TROPONIN I    EKG  EKG Interpretation  Date/Time:  Friday April 16 2016 03:48:29 EDT Ventricular Rate:  77 PR Interval:    QRS Duration: 113 QT Interval:  402 QTC Calculation: 455 R Axis:   85 Text Interpretation:  Sinus rhythm Prolonged PR interval IRBBB and LPFB No significant change since last tracing Confirmed by Bebe Shaggy  MD, Jewett Mcgann ([id number]) on 04/16/2016 3:57:46 AM       Radiology Dg Chest Portable 1 View  Result Date: 04/16/2016 CLINICAL DATA:  Fall at nursing home short of breath EXAM: PORTABLE CHEST 1 VIEW COMPARISON:  04/06/2016 FINDINGS: Partially visualized hardware within the cervical thoracic spine. A tracheostomy tube is in place. Elevated right diaphragm with colonic interposition . Hazy atelectasis or infiltrate at the right base. Mild to moderate cardiomegaly. Atherosclerosis of the aorta. No pneumothorax. IMPRESSION: 1. Slightly elevated right diaphragm with: Inter positioning. 2. Possible tiny right effusion with hazy right basilar atelectasis or infiltrate 3. Mild to moderate cardiomegaly 4. Atherosclerosis of the aorta Electronically Signed   By: Jasmine Pang M.D.   On: 04/16/2016 03:23    Procedures Procedures (including critical care time)  Medications Ordered in ED Medications  albuterol (PROVENTIL) (2.5 MG/3ML) 0.083% nebulizer solution 5 mg (5 mg Nebulization Given  04/16/16 0454)    DIAGNOSTIC STUDIES:  Oxygen Saturation is 84% on RA, low by my interpretation.    COORDINATION OF CARE:  2:53 AM Discussed treatment plan with pt at bedside and pt agreed to plan.  Initial Impression / Assessment and Plan / ED Course  I  have reviewed the triage vital signs and the nursing notes.  Pertinent labs & imaging results that were available during my care of the patient were reviewed by me and considered in my medical decision making (see chart for details).  Clinical Course    Pt initially had hypoxia that is improved He is awake/alert I spoke to staff at [facility] - they report he has only been there about 4 days Apparently he has fallen out of bed (he does not walk) and was acting confused, asking for things that weren't there, etc Unclear cause of confusion though hypoxia is possible Patient is a DNR  5:49 AM Pt awake/alert He was given albuterol He also had some suctioning and appears improved I capped his trach and no hypoxia or distress He denies new complaints He does report recent falls from bed but denies any new injury He denies cp/sob at this time He is answering questions appropriately, but he can not recall all details from recent hospitalizations which would be expected It appears he had recent trach placement, went to [facility] and now at rehab facility.  He recently had PEG placed.  I suspect he has deconditioning and has limited mobility at this time I suspect he is at baseline (though from recent charting in EPIC it is unclear what is usual baseline) CXR negative No UTI He is afebrile and not septic appearing I will d/c back to facility Pt agreeable   I personally performed the services described in this documentation, which was scribed in my presence. The recorded information has been reviewed and is accurate.   Final Clinical Impressions(s) / ED Diagnoses   Final diagnoses:  Tracheostomy dependence (HCC)  Encephalopathy     New Prescriptions New Prescriptions   No medications on file     Zadie Rhine, MD 04/16/16 [phone]

## 2016-04-16 NOTE — ED Triage Notes (Signed)
At [facility] HR fall tonight with no pain voiced answers all questions appropriately recent [facility] .

## 2016-04-16 NOTE — Discharge Instructions (Signed)

## 2016-04-18 IMAGING — CR DG CHEST 1V PORT
1 series · 1 of 1 positions shown · non-contrast
Comparison: 08/04/2014

CLINICAL DATA: Headaches, blurred vision, and elevated blood
pressure at home. Home oxygen. Patient was seen on 08/15/2014 for
constipation and has not had a bowel movement since then.

EXAM:
PORTABLE CHEST - 1 VIEW

[ap]
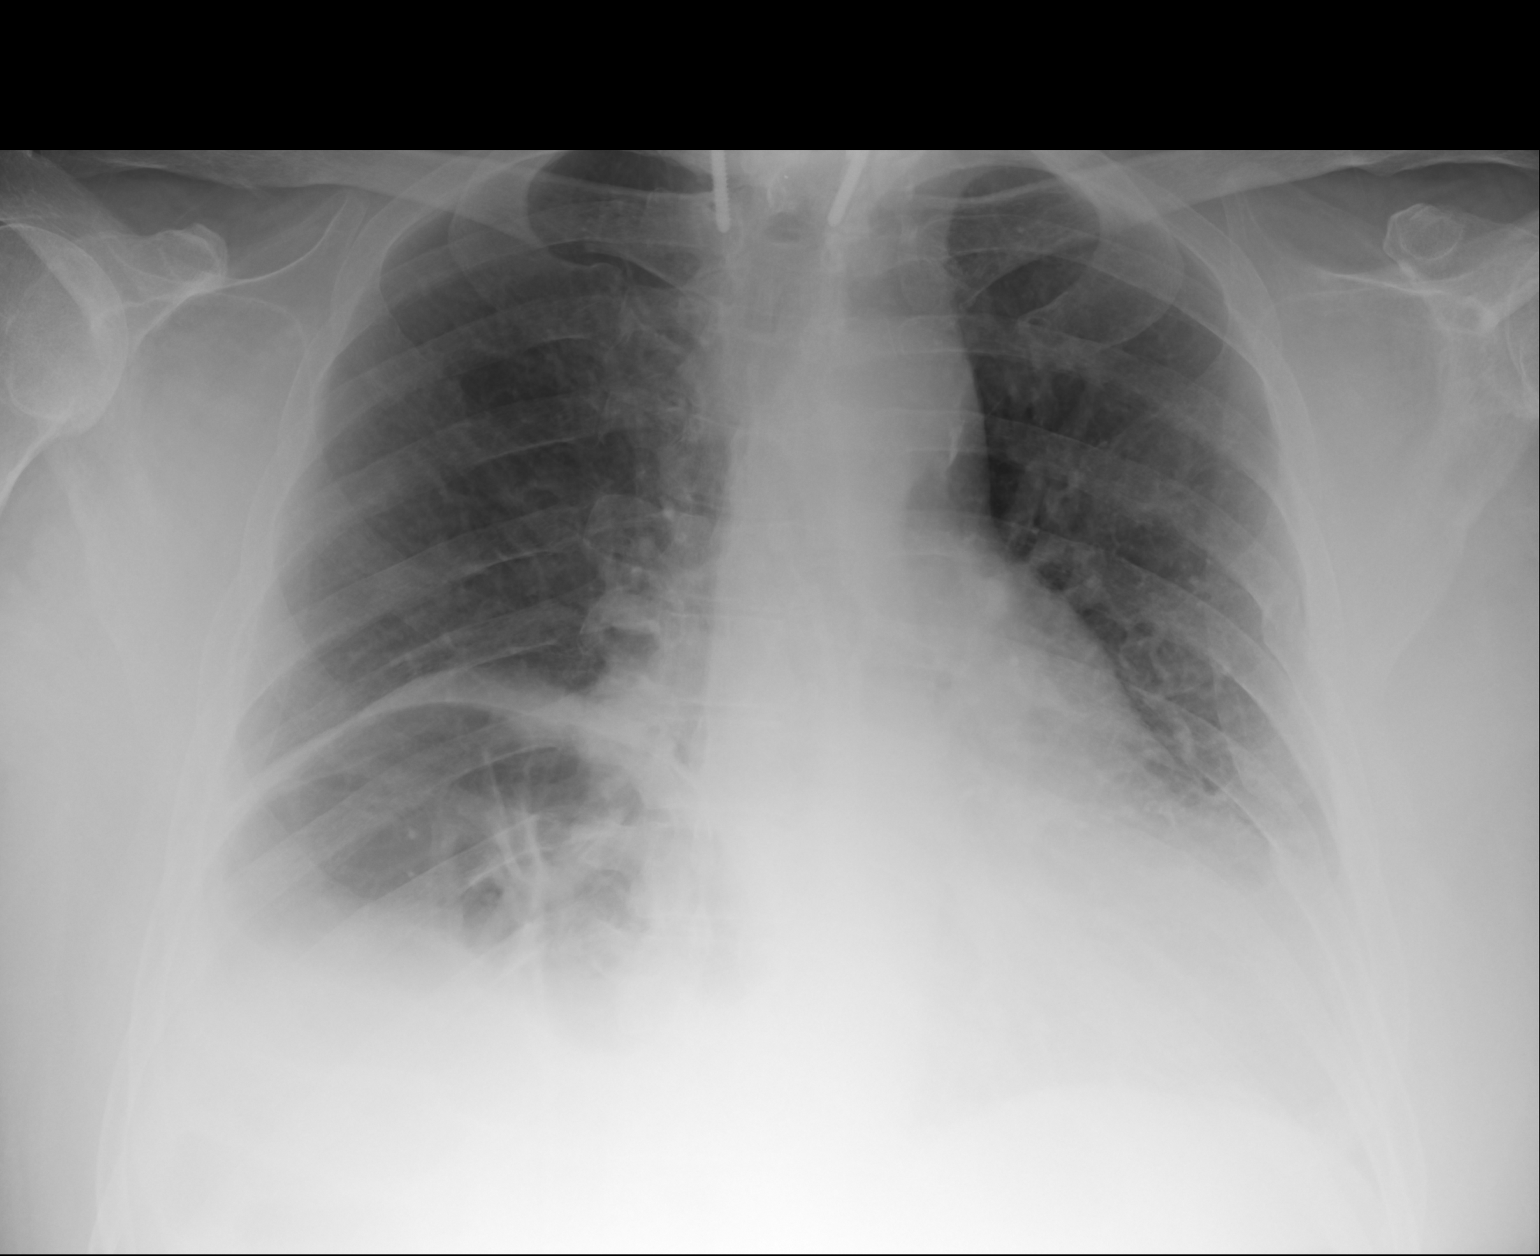

[1 of 1 positions shown; findings below may reference images not displayed]

FINDINGS: Shallow inspiration with elevation of the right hemidiaphragm.
Colonic interposition under the right hemidiaphragm. Cardiac
enlargement without significant vascular congestion. Atelectasis in
the lung bases. Old left rib fractures. Postoperative changes in the
cervical spine.
IMPRESSION: Shallow inspiration with elevation of right hemidiaphragm.
Atelectasis in the lung bases. Colonic interposition under the right
hemidiaphragm. Cardiac enlargement.

## 2016-04-19 ENCOUNTER — Encounter: Payer: Self-pay | Admitting: Internal Medicine

## 2016-04-19 ENCOUNTER — Non-Acute Institutional Stay (SKILLED_NURSING_FACILITY): Payer: Medicare Other | Admitting: Internal Medicine

## 2016-04-19 DIAGNOSIS — I10 Essential (primary) hypertension: Secondary | ICD-10-CM

## 2016-04-19 DIAGNOSIS — M542 Cervicalgia: Secondary | ICD-10-CM | POA: Diagnosis not present

## 2016-04-19 DIAGNOSIS — M549 Dorsalgia, unspecified: Secondary | ICD-10-CM

## 2016-04-19 DIAGNOSIS — J42 Unspecified chronic bronchitis: Secondary | ICD-10-CM

## 2016-04-19 DIAGNOSIS — Z931 Gastrostomy status: Secondary | ICD-10-CM | POA: Diagnosis not present

## 2016-04-19 DIAGNOSIS — J9611 Chronic respiratory failure with hypoxia: Secondary | ICD-10-CM

## 2016-04-19 DIAGNOSIS — G8929 Other chronic pain: Principal | ICD-10-CM

## 2016-04-19 DIAGNOSIS — K219 Gastro-esophageal reflux disease without esophagitis: Secondary | ICD-10-CM | POA: Diagnosis not present

## 2016-04-19 DIAGNOSIS — F339 Major depressive disorder, recurrent, unspecified: Secondary | ICD-10-CM

## 2016-04-19 DIAGNOSIS — Z930 Tracheostomy status: Secondary | ICD-10-CM | POA: Diagnosis not present

## 2016-04-19 NOTE — Progress Notes (Signed)
Patient ID: Chad Huffman, male   DOB: 17-Jan-1946, 70 y.o.   MRN: [id number]    HISTORY AND PHYSICAL   DATE: 04/19/2016  Location:    [place] Room Number: 194 A Place of Service: SNF (31)   Extended Emergency Contact Information Primary Emergency Contact: Homen,[name]          Bill [place] Phone: [phone] Work Phone: [phone] Relation: Brother Secondary Emergency Contact: Rosezetta Schlatter, [place] Mobile Phone: [phone] Relation: Other  Advanced Directive information Does patient have an advance directive?: Yes, Type of Advance Directive: Out of facility DNR (pink MOST or yellow form), Does patient want to make changes to advanced directive?: No - Patient declined  Chief Complaint  Patient presents with  . New Admit To SNF    HPI:  70 yo male seen today as a new admission into SNF following hospital stay for acute/chronic respiratory failure s/p trach, COPD, VRE UTI, bronchopneumonia. He presented to ED with signs/sx's c/w being down for extended period of time. He req'd emergent tracheostomy by ENT. G tube was placed 9/19th. He was dc/d to [facility] and now  presents to SNF for short term rehab.  Today he reports he would like to eat and drink by mouth and states he has not had anything to eat/drink in several days. He gets TF and free water flushes. No dysphagia. He was taken to the ED 10/27th due to change in MS, hypoxia and afll from bed. CXR neg and UA revealed no UTI. He was returned to SNF. He is a poor historian due to memory loss. Hx obtained from chart. He has a GI consult for esophageal stricture pending. CBG 101 today. He is on lovenox injections  COPD/chronic respiratory failure with hypoxia - takes pulmicort, duonebs, prednisone,  O2  Depression - mood stable on klonopin, seroquel, sertraline  Chronic pain syndrome/DJD/peripheral neuropathy - on roxicodone  GERD -  stable on pepcid  Edema/HTN - stable on demadex  Dysphagia - has Peg tube for TF due to esophageal stricture.  Hx AAA s/p repair  Past Medical History:  Diagnosis Date  . Antalgic gait   . Carotid artery occlusion   . Chronic neck and back pain   . Complication of anesthesia    pt has had cervical fusion-limited neck flextion  . COPD (chronic obstructive pulmonary disease) ([place])   . Depression   . DJD (degenerative joint disease)    Right shoulder  . Hypertension   . Lumbago   . Peripheral neuropathy [facility])     Past Surgical History:  Procedure Laterality Date  . ABDOMINAL AORTIC ANEURYSM REPAIR  2009   Endovascular AAA repair  . ABDOMINAL AORTIC ANEURYSM REPAIR    . CARPAL TUNNEL RELEASE     bilateral CTS  . HAMMER TOE SURGERY  12/10/2011   Procedure: HAMMER TOE CORRECTION;  Surgeon: Tyson Dense, DPM;  Location: [place];  Service: Podiatry;  Laterality: Right;  Right hammertoe repair second right with 2.5x42 orthopro screw   . IR GENERIC HISTORICAL  04/05/2016   IR GASTROSTOMY TUBE MOD SED 04/05/2016 [facility]-INTERV RAD  . METATARSAL OSTEOTOMY  12/10/2011   Procedure: METATARSAL OSTEOTOMY;  Surgeon: Tyson Dense, DPM;  Location: [place];  Service: Podiatry;  Laterality: Right;  Right Keller arthroplasty with size 5 silicone implant right great toe; second metatarsal osteotomy with 2.0x14 screw  . NECK  SURGERY    . PERIPHERAL VASCULAR CATHETERIZATION N/A 02/11/2015   Procedure: Carotid Angiography;  Surgeon: Serafina Mitchell, MD;  Location: [place] CV LAB;  Service: Cardiovascular;  Laterality: N/A;  . PILONIDAL CYST / SINUS EXCISION    . Rotary cuff    . SPINE SURGERY  09/15/11   Scar tissue removed- back  . SPINE SURGERY  09/15/11   Scar tissue removed- back  . TONSILLECTOMY    . TRACHEOSTOMY TUBE PLACEMENT N/A 03/03/2016   Procedure: TRACHEOSTOMY;  Surgeon: Clyde Canterbury, MD;  Location: [facility] ORS;  Service: ENT;  Laterality: N/A;    Patient  Care Team: Gildardo Cranker, DO as PCP - General (Internal Medicine) Max Villa Herb, DPM (Podiatry) Erby Pian, MD as Referring Physician (Specialist) Gaspar Cola, MD (Neurosurgery) Julieanne Manson Leeanne Mannan., MD (Rheumatology)  Social History   Social History  . Marital status: Single    Spouse name: N/A  . Number of children: N/A  . Years of education: N/A   Occupational History  . Not on file.   Social History Main Topics  . Smoking status: Former Smoker    Quit date: 08/30/2006  . Smokeless tobacco: Never Used  . Alcohol use 1.2 oz/week    2 Cans of beer per week     Comment: occasional use  . Drug use: No  . Sexual activity: Not on file   Other Topics Concern  . Not on file   Social History Narrative  . No narrative on file     reports that he quit smoking about 9 years ago. He has never used smokeless tobacco. He reports that he drinks about 1.2 oz of alcohol per week . He reports that he does not use drugs.  Family History  Problem Relation Age of Onset  . Cancer Mother     Stomach  . Hypertension Brother   . Heart attack Father    Family Status  Relation Status  . Mother Deceased  . Brother Alive  . Father Deceased     There is no immunization history on file for this patient.  Allergies  Allergen Reactions  . Ampicillin Swelling and Other (See Comments)    Unable to obtain enough information to answer additional questions about this medication.  Has patient had a PCN reaction causing immediate rash, facial/tongue/throat swelling, SOB or lightheadedness with hypotension: Yes Has patient had a PCN reaction causing severe rash involving mucus membranes or skin necrosis: No Has patient had a PCN reaction that required hospitalization No Has patient had a PCN reaction occurring within the last 10 years: Yes If all of the above answers are "NO", then may pr  . Nortriptyline Other (See Comments)    Sleep walking  . Brimonidine Other (See Comments)     Reaction:  Unknown   . Cyclobenzaprine Other (See Comments)    Reaction: Hallucinations  . Tizanidine Other (See Comments)    Reaction: Hallucinations  . Gabapentin Rash    Medications: Patient's Medications  New Prescriptions   No medications on file  Previous Medications   BUDESONIDE (PULMICORT) 0.5 MG/2ML NEBULIZER SOLUTION    Take 0.5 mg by nebulization 2 (two) times daily.   CLONAZEPAM (KLONOPIN) 0.5 MG TABLET    Take 0.5 mg by mouth 2 (two) times daily.   DOCUSATE SODIUM (COLACE) 100 MG CAPSULE    100 mg 2 (two) times daily.   ENOXAPARIN (LOVENOX) 40 MG/0.4ML INJECTION    Inject 40 mg into the skin daily.  FAMOTIDINE (PEPCID AC) 10 MG CHEWABLE TABLET    Place 20 mg into feeding tube 2 (two) times daily.   FOLIC ACID (FOLVITE) 1 MG TABLET    Take 1 mg by mouth daily.   IPRATROPIUM-ALBUTEROL (DUONEB) 0.5-2.5 (3) MG/3ML SOLN    Take 3 mLs by nebulization 3 (three) times daily.   MULTIPLE VITAMIN (MULTIVITAMIN WITH MINERALS) TABS TABLET    Take 1 tablet by mouth daily.   OXYCODONE (ROXICODONE) 5 MG IMMEDIATE RELEASE TABLET    Take 1 tablet (5 mg total) by mouth every 8 (eight) hours as needed.   OXYGEN    Inhale into the lungs. 4L/ MIN via trach PRN   PREDNISONE PO    Place into feeding tube. 40 mg x 3 days, 30 mg x 3 days, 20 mg x 3 days, 10 mg x 3 days   QUETIAPINE (SEROQUEL) 50 MG TABLET    Take 150 mg by mouth at bedtime.   SCOPOLAMINE (TRANSDERM-SCOP) 1 MG/3DAYS    Place 1.5 patches onto the skin every 3 (three) days.   SERTRALINE HCL (ZOLOFT PO)    Place 75 mg into feeding tube daily.   TORSEMIDE (DEMADEX) 20 MG TABLET    Take 20 mg by mouth daily.  Modified Medications   No medications on file  Discontinued Medications   IPRATROPIUM-ALBUTEROL (DUONEB) 0.5-2.5 (3) MG/3ML SOLN    Take 3 mLs by nebulization every 6 (six) hours.    Review of Systems  Unable to perform ROS: Other (memory loss)    Vitals:   04/19/16 0920  BP: 124/68  Pulse: 72  Resp: 20  Temp: 97 F  (36.1 C)  TempSrc: Oral  SpO2: 92%  Weight: 208 lb 6.4 oz (94.5 kg)  Height: 5' 10"  (1.778 m)   Body mass index is 29.9 kg/m.  Physical Exam  Constitutional: He is oriented to person, place, and time. He appears well-developed and well-nourished.  Looks well in NAD, [place] O2 intact; sitting on toilet  HENT:  Mouth/Throat: Oropharynx is clear and moist. No oropharyngeal exudate.  MMM  Eyes: Pupils are equal, round, and reactive to light. No scleral icterus.  Neck: Neck supple. Carotid bruit is not present. No tracheal deviation present.  Trach intact and capped. No purulent d/c  Cardiovascular: Normal rate, regular rhythm and intact distal pulses.  Exam reveals no gallop and no friction rub.   Murmur (1/6 SEM) heard. Trace LE swelling. No calf TTP  Pulmonary/Chest: Effort normal and breath sounds normal. He has no wheezes. He has no rales. He exhibits no tenderness.  Abdominal: Soft. Bowel sounds are normal. He exhibits no distension, no abdominal bruit, no pulsatile midline mass and no mass. There is no hepatomegaly. There is no tenderness. There is no rebound and no guarding.  Peg tube intact and without purulent d/c,tube clamped; obese  Musculoskeletal: He exhibits edema.  Lymphadenopathy:    He has no cervical adenopathy.  Neurological: He is alert and oriented to person, place, and time.  Skin: Skin is warm and dry. No rash noted.  Psychiatric: He has a normal mood and affect. His behavior is normal. Thought content normal.     Labs reviewed: Admission on 04/16/2016, Discharged on 04/16/2016  Component Date Value Ref Range Status  . Sodium 04/16/2016 135  135 - 145 mmol/L Final  . Potassium 04/16/2016 3.8  3.5 - 5.1 mmol/L Final  . Chloride 04/16/2016 89* 101 - 111 mmol/L Final  . CO2 04/16/2016 39* 22 - 32 mmol/L  Final  . Glucose, Bld 04/16/2016 102* 65 - 99 mg/dL Final  . BUN 04/16/2016 40* 6 - 20 mg/dL Final  . Creatinine, Ser 04/16/2016 0.98  0.61 - 1.24 mg/dL Final    . Calcium 04/16/2016 8.6* 8.9 - 10.3 mg/dL Final  . GFR calc non Af Amer 04/16/2016 >60  >60 mL/min Final  . GFR calc Af Amer 04/16/2016 >60  >60 mL/min Final   Comment: (NOTE) The eGFR has been calculated using the CKD EPI equation. This calculation has not been validated in all clinical situations. eGFR's persistently <60 mL/min signify possible Chronic Kidney Disease.   . Anion gap 04/16/2016 7  5 - 15 Final  . WBC 04/16/2016 15.2* 4.0 - 10.5 K/uL Final  . RBC 04/16/2016 3.38* 4.22 - 5.81 MIL/uL Final  . Hemoglobin 04/16/2016 9.9* 13.0 - 17.0 g/dL Final  . HCT 04/16/2016 32.5* 39.0 - 52.0 % Final  . MCV 04/16/2016 96.2  78.0 - 100.0 fL Final  . MCH 04/16/2016 29.3  26.0 - 34.0 pg Final  . MCHC 04/16/2016 30.5  30.0 - 36.0 g/dL Final  . RDW 04/16/2016 17.8* 11.5 - 15.5 % Final  . Platelets 04/16/2016 122* 150 - 400 K/uL Final  . Neutrophils Relative % 04/16/2016 74  % Final  . Lymphocytes Relative 04/16/2016 12  % Final  . Monocytes Relative 04/16/2016 6  % Final  . Eosinophils Relative 04/16/2016 8  % Final  . Basophils Relative 04/16/2016 0  % Final  . Neutro Abs 04/16/2016 11.3* 1.7 - 7.7 K/uL Final  . Lymphs Abs 04/16/2016 1.8  0.7 - 4.0 K/uL Final  . Monocytes Absolute 04/16/2016 0.9  0.1 - 1.0 K/uL Final  . Eosinophils Absolute 04/16/2016 1.2* 0.0 - 0.7 K/uL Final  . Basophils Absolute 04/16/2016 0.0  0.0 - 0.1 K/uL Final  . RBC Morphology 04/16/2016 POLYCHROMASIA PRESENT   Final  . WBC Morphology 04/16/2016 MILD LEFT SHIFT (1-5% METAS, OCC MYELO, OCC BANDS)   Final  . Smear Review 04/16/2016 LARGE PLATELETS PRESENT   Final  . Color, Urine 04/16/2016 YELLOW  YELLOW Final  . APPearance 04/16/2016 CLEAR  CLEAR Final  . Specific Gravity, Urine 04/16/2016 1.017  1.005 - 1.030 Final  . pH 04/16/2016 7.0  5.0 - 8.0 Final  . Glucose, UA 04/16/2016 NEGATIVE  NEGATIVE mg/dL Final  . Hgb urine dipstick 04/16/2016 NEGATIVE  NEGATIVE Final  . Bilirubin Urine 04/16/2016 NEGATIVE   NEGATIVE Final  . Ketones, ur 04/16/2016 NEGATIVE  NEGATIVE mg/dL Final  . Protein, ur 04/16/2016 NEGATIVE  NEGATIVE mg/dL Final  . Nitrite 04/16/2016 NEGATIVE  NEGATIVE Final  . Leukocytes, UA 04/16/2016 NEGATIVE  NEGATIVE Final  . Troponin I 04/16/2016 <0.03  <0.03 ng/mL Final  Admission on 03/09/2016, Discharged on 04/12/2016  No results displayed because visit has over 200 results.    Admission on 03/03/2016, Discharged on 03/09/2016  No results displayed because visit has over 200 results.    Admission on 02/27/2016, Discharged on 02/29/2016  Component Date Value Ref Range Status  . FIO2 03/05/2016 28.00   Final  . Delivery systems 03/05/2016 NASAL CANNULA   Final  . pH, Arterial 03/05/2016 7.33* 7.350 - 7.450 Final  . pCO2 arterial 03/05/2016 72* 32.0 - 48.0 mmHg Final   Comment: CRITICAL RESULT CALLED TO, READ BACK BY AND VERIFIED WITH: DR Alfred Levins [id number] JGT   . pO2, Arterial 03/05/2016 107  83.0 - 108.0 mmHg Final  . Bicarbonate 03/05/2016 38.0* 20.0 - 28.0 mmol/L Final  .  Acid-Base Excess 03/05/2016 10.1* 0.0 - 2.0 mmol/L Final  . O2 Saturation 03/05/2016 97.8  % Final  . Patient temperature 03/05/2016 37.0   Final  . Collection site 03/05/2016 LEFT RADIAL   Final  . Sample type 03/05/2016 ARTERIAL DRAW   Final  . Allens test (pass/fail) 03/05/2016 PASS  PASS Final  . WBC 02/27/2016 7.6  3.8 - 10.6 K/uL Final  . RBC 02/27/2016 3.59* 4.40 - 5.90 MIL/uL Final  . Hemoglobin 02/27/2016 10.5* 13.0 - 18.0 g/dL Final  . HCT 02/27/2016 31.6* 40.0 - 52.0 % Final  . MCV 02/27/2016 87.9  80.0 - 100.0 fL Final  . MCH 02/27/2016 29.3  26.0 - 34.0 pg Final  . MCHC 02/27/2016 33.4  32.0 - 36.0 g/dL Final  . RDW 02/27/2016 17.1* 11.5 - 14.5 % Final  . Platelets 02/27/2016 141* 150 - 440 K/uL Final  . Neutrophils Relative % 02/27/2016 77  % Final  . Neutro Abs 02/27/2016 5.9  1.4 - 6.5 K/uL Final  . Lymphocytes Relative 02/27/2016 9  % Final  . Lymphs Abs 02/27/2016 0.7* 1.0 -  3.6 K/uL Final  . Monocytes Relative 02/27/2016 12  % Final  . Monocytes Absolute 02/27/2016 0.9  0.2 - 1.0 K/uL Final  . Eosinophils Relative 02/27/2016 1  % Final  . Eosinophils Absolute 02/27/2016 0.1  0 - 0.7 K/uL Final  . Basophils Relative 02/27/2016 1  % Final  . Basophils Absolute 02/27/2016 0.1  0 - 0.1 K/uL Final  . Sodium 02/27/2016 134* 135 - 145 mmol/L Final  . Potassium 02/27/2016 4.3  3.5 - 5.1 mmol/L Final  . Chloride 02/27/2016 93* 101 - 111 mmol/L Final  . CO2 02/27/2016 34* 22 - 32 mmol/L Final  . Glucose, Bld 02/27/2016 99  65 - 99 mg/dL Final  . BUN 02/27/2016 20  6 - 20 mg/dL Final  . Creatinine, Ser 02/27/2016 1.06  0.61 - 1.24 mg/dL Final  . Calcium 02/27/2016 8.8* 8.9 - 10.3 mg/dL Final  . GFR calc non Af Amer 02/27/2016 >60  >60 mL/min Final  . GFR calc Af Amer 02/27/2016 >60  >60 mL/min Final   Comment: (NOTE) The eGFR has been calculated using the CKD EPI equation. This calculation has not been validated in all clinical situations. eGFR's persistently <60 mL/min signify possible Chronic Kidney Disease.   . Anion gap 02/27/2016 7  5 - 15 Final  . Tricyclic, Ur Screen 29/19/1660 NONE DETECTED  NONE DETECTED Final  . Amphetamines, Ur Screen 02/27/2016 NONE DETECTED  NONE DETECTED Final  . MDMA (Ecstasy)Ur Screen 02/27/2016 NONE DETECTED  NONE DETECTED Final  . Cocaine Metabolite,Ur [place] 02/27/2016 NONE DETECTED  NONE DETECTED Final  . Opiate, Ur Screen 02/27/2016 NONE DETECTED  NONE DETECTED Final  . Phencyclidine (PCP) Ur S 02/27/2016 NONE DETECTED  NONE DETECTED Final  . Cannabinoid 50 Ng, Ur  02/27/2016 NONE DETECTED  NONE DETECTED Final  . Barbiturates, Ur Screen 02/27/2016 NONE DETECTED  NONE DETECTED Final  . Benzodiazepine, Ur Scrn 02/27/2016 POSITIVE* NONE DETECTED Final  . Methadone Scn, Ur 02/27/2016 NONE DETECTED  NONE DETECTED Final   Comment: (NOTE) 600  Tricyclics, urine               Cutoff 1000 ng/mL 200  Amphetamines, urine              Cutoff 1000 ng/mL 300  MDMA (Ecstasy), urine           Cutoff 500 ng/mL 400  Cocaine  Metabolite, urine       Cutoff 300 ng/mL 500  Opiate, urine                   Cutoff 300 ng/mL 600  Phencyclidine (PCP), urine      Cutoff 25 ng/mL 700  Cannabinoid, urine              Cutoff 50 ng/mL 800  Barbiturates, urine             Cutoff 200 ng/mL 900  Benzodiazepine, urine           Cutoff 200 ng/mL 1000 Methadone, urine                Cutoff 300 ng/mL 1100 1200 The urine drug screen provides only a preliminary, unconfirmed 1300 analytical test result and should not be used for non-medical 1400 purposes. Clinical consideration and professional judgment should 1500 be applied to any positive drug screen result due to possible 1600 interfering substances. A more specific alternate chemical method 1700 must be used in order to obtain a confirmed analytical result.  1800 Gas chromato                          graphy / mass spectrometry (GC/MS) is the preferred 1900 confirmatory method.   . Salicylate Lvl 62/69/4854 <4.0  2.8 - 30.0 mg/dL Final  . Acetaminophen (Tylenol), Serum 02/27/2016 <10* 10 - 30 ug/mL Final   Comment:        THERAPEUTIC CONCENTRATIONS VARY SIGNIFICANTLY. A RANGE OF 10-30 ug/mL MAY BE AN EFFECTIVE CONCENTRATION FOR MANY PATIENTS. HOWEVER, SOME ARE BEST TREATED AT CONCENTRATIONS OUTSIDE THIS RANGE. ACETAMINOPHEN CONCENTRATIONS >150 ug/mL AT 4 HOURS AFTER INGESTION AND >50 ug/mL AT 12 HOURS AFTER INGESTION ARE OFTEN ASSOCIATED WITH TOXIC REACTIONS.   . Troponin I 02/27/2016 <0.03  <0.03 ng/mL Final  . pH, Ven 02/27/2016 7.36  7.250 - 7.430 Final  . pCO2, Ven 02/27/2016 65* 44.0 - 60.0 mmHg Final  . pO2, Ven 02/27/2016 49.0* 32.0 - 45.0 mmHg Final  . Patient temperature 02/27/2016 37.0   Final  . Collection site 02/27/2016 VENOUS   Final  . Sample type 02/27/2016 VENOUS   Final  . Sodium 02/28/2016 135  135 - 145 mmol/L Final  . Potassium 02/28/2016 4.0  3.5 - 5.1  mmol/L Final  . Chloride 02/28/2016 95* 101 - 111 mmol/L Final  . CO2 02/28/2016 34* 22 - 32 mmol/L Final  . Glucose, Bld 02/28/2016 90  65 - 99 mg/dL Final  . BUN 02/28/2016 17  6 - 20 mg/dL Final  . Creatinine, Ser 02/28/2016 0.88  0.61 - 1.24 mg/dL Final  . Calcium 02/28/2016 8.6* 8.9 - 10.3 mg/dL Final  . GFR calc non Af Amer 02/28/2016 >60  >60 mL/min Final  . GFR calc Af Amer 02/28/2016 >60  >60 mL/min Final   Comment: (NOTE) The eGFR has been calculated using the CKD EPI equation. This calculation has not been validated in all clinical situations. eGFR's persistently <60 mL/min signify possible Chronic Kidney Disease.   . Anion gap 02/28/2016 6  5 - 15 Final  . WBC 02/28/2016 7.0  3.8 - 10.6 K/uL Final  . RBC 02/28/2016 3.36* 4.40 - 5.90 MIL/uL Final  . Hemoglobin 02/28/2016 9.8* 13.0 - 18.0 g/dL Final  . HCT 02/28/2016 29.0* 40.0 - 52.0 % Final  . MCV 02/28/2016 86.2  80.0 - 100.0 fL Final  . MCH 02/28/2016 29.2  26.0 - 34.0 pg Final  . MCHC 02/28/2016 33.9  32.0 - 36.0 g/dL Final  . RDW 02/28/2016 17.1* 11.5 - 14.5 % Final  . Platelets 02/28/2016 137* 150 - 440 K/uL Final  . pH, Arterial 02/28/2016 7.41  7.350 - 7.450 Final  . pCO2 arterial 02/28/2016 59* 32.0 - 48.0 mmHg Final  . pO2, Arterial 02/28/2016 76* 83.0 - 108.0 mmHg Final  . Bicarbonate 02/28/2016 37.4* 20.0 - 28.0 mmol/L Final  . Acid-Base Excess 02/28/2016 11.1* 0.0 - 2.0 mmol/L Final  . O2 Saturation 02/28/2016 95.2  % Final  . Patient temperature 02/28/2016 37.0   Final  . Collection site 02/28/2016 RIGHT RADIAL   Final  . Sample type 02/28/2016 ARTERIAL DRAW   Final  . Allens test (pass/fail) 02/28/2016 PASS  PASS Final  . MRSA by PCR 02/28/2016 NEGATIVE  NEGATIVE Final   Comment:        The GeneXpert MRSA Assay (FDA approved for NASAL specimens only), is one component of a comprehensive MRSA colonization surveillance program. It is not intended to diagnose MRSA infection nor to guide or monitor  treatment for MRSA infections.   . Glucose-Capillary 02/27/2016 105* 65 - 99 mg/dL Final    Ct Chest Wo Contrast  Result Date: 03/23/2016 CLINICAL DATA:  Respiratory distress.  Abnormal chest radiography. EXAM: CT CHEST WITHOUT CONTRAST TECHNIQUE: Multidetector CT imaging of the chest was performed following the standard protocol without IV contrast. COMPARISON:  Chest radiography same day.  Chest CT 03/03/2016. FINDINGS: Cardiovascular: Coronary artery and aortic atherosclerosis. Mediastinum/Nodes: No worrisome lymph nodes. No masses. Tracheostomy grossly well positioned. Nasogastric tube enters the abdomen. Lungs/Pleura: Right hemidiaphragm is elevated. No pleural fluid on the left. Minimal hazy infiltrate present in the apex of the left upper lobe. The right chest shows a small effusion largely layering dependently. There is extensive airspace filling in the upper lobe consistent with bronchopneumonia. There is patchy density in the right lower lobe in the right middle lobe that could be atelectasis or lesser bronchopneumonia. The appearance is improved since the CT of 03/03/2016. Upper Abdomen: Calcified gallstones as seen previously. Musculoskeletal: No significant bone finding. IMPRESSION: Improved on the right since 03/03/2016. Elevated right hemidiaphragm. Small effusion on the right layering dependently. Infiltrate in the right upper lobe consistent with bronchopneumonia. Lesser infiltrate/ atelectasis in the right lower lobe and right middle lobe. Small area patchy airspace filling at the left apex consistent with bronchopneumonia. This left apical infiltrate is actually slightly worsened when compared to the previous CT. Electronically Signed   By: Nelson Chimes M.D.   On: 03/23/2016 14:23   Ir Gastrostomy Tube Mod Sed  Result Date: 04/05/2016 CLINICAL DATA:  Respiratory failure and need for gastrostomy tube for nutrition. EXAM: PERCUTANEOUS GASTROSTOMY TUBE PLACEMENT ANESTHESIA/SEDATION: 2.0  mg IV Versed; 100 mcg IV Fentanyl. Total Moderate Sedation Time 10 minutes. CONTRAST:  25 mL Isovue-300 injected into the gastric lumen. MEDICATIONS: 1 g IV vancomycin. Vancomycin was given within two hours of incision. Vancomycin was given due to an antibiotic allergy. FLUOROSCOPY TIME:  1 minute and 54 seconds, 19 mGy. PROCEDURE: The procedure, risks, benefits, and alternatives were explained to the patient's brother who is power of attorney. Questions regarding the procedure were encouraged and answered. The patient's brother understands and consents to the procedure. A 5-French catheter was then advanced through the the patient's mouth under fluoroscopy into the esophagus and to the level of the stomach. This catheter was used to insufflate the stomach with air under  fluoroscopy. The abdominal wall was prepped with chlorhexidine in a sterile fashion, and a sterile drape was applied covering the operative field. A sterile gown and sterile gloves were used for the procedure. Local anesthesia was provided with 1% Lidocaine. A skin incision was made in the upper abdominal wall. Under fluoroscopy, an 18 gauge trocar needle was advanced into the stomach. Contrast injection was performed to confirm intraluminal position of the needle tip. A single T tack was then deployed in the lumen of the stomach. This was brought up to tension at the skin surface. Over a guidewire, a 9-French sheath was advanced into the lumen of the stomach. The wire was left in place as a safety wire. A loop snare device from a percutaneous gastrostomy kit was then advanced into the stomach. A floppy guide wire was advanced through the orogastric catheter under fluoroscopy in the stomach. The loop snare advanced through the percutaneous gastric access was used to snare the guide wire. This allowed withdrawal of the loop snare out of the patient's mouth by retraction of the orogastric catheter and wire. A 20-French bumper retention gastrostomy  tube was looped around the snare device. It was then pulled back through the patient's mouth. The retention bumper was brought up to the anterior gastric wall. The T tack suture was cut at the skin. The exiting gastrostomy tube was cut to appropriate length and a feeding adapter applied. The catheter was injected with contrast material to confirm position and a fluoroscopic spot image saved. The tube was then flushed with saline. A dressing was applied over the gastrostomy exit site. COMPLICATIONS: None. FINDINGS: The stomach distended well with air allowing safe placement of the gastrostomy tube. After placement, the tip of the gastrostomy tube lies in the body of the stomach. IMPRESSION: Percutaneous gastrostomy with placement of a 20-French bumper retention tube in the body of the stomach. This tube can be used for percutaneous feeds beginning in 24 hours after placement. Electronically Signed   By: Aletta Edouard M.D.   On: 04/05/2016 16:53   Dg Chest Portable 1 View  Result Date: 04/16/2016 CLINICAL DATA:  Fall at nursing home short of breath EXAM: PORTABLE CHEST 1 VIEW COMPARISON:  04/06/2016 FINDINGS: Partially visualized hardware within the cervical thoracic spine. A tracheostomy tube is in place. Elevated right diaphragm with colonic interposition . Hazy atelectasis or infiltrate at the right base. Mild to moderate cardiomegaly. Atherosclerosis of the aorta. No pneumothorax. IMPRESSION: 1. Slightly elevated right diaphragm with: Inter positioning. 2. Possible tiny right effusion with hazy right basilar atelectasis or infiltrate 3. Mild to moderate cardiomegaly 4. Atherosclerosis of the aorta Electronically Signed   By: Donavan Foil M.D.   On: 04/16/2016 03:23   Dg Chest Port 1 View  Result Date: 04/06/2016 CLINICAL DATA:  70 year old male with fever EXAM: PORTABLE CHEST 1 VIEW COMPARISON:  Chest radiograph dated 03/31/2016 FINDINGS: There is shallow inspiration with mild eventration of the right  hemidiaphragm. Bibasilar densities again noted which may represent atelectasis or infiltrate. A small right pleural effusion is noted. There is no pneumothorax. There is silhouetting of the cardiac borders. No acute osseous pathology. IMPRESSION: Shallow inspiration with bibasilar atelectasis versus infiltrate. Small right pleural effusion. Electronically Signed   By: Anner Crete M.D.   On: 04/06/2016 01:39   Dg Chest Port 1 View  Result Date: 03/31/2016 CLINICAL DATA:  Respiratory failure. EXAM: PORTABLE CHEST 1 VIEW COMPARISON:  CT chest ends plain film of the chest 03/23/2016. FINDINGS: Tracheostomy tube  and NG tube are in place. Elevation of the right hemidiaphragm relative to the left is again seen. There is patchy airspace disease throughout the right chest and likely a small right effusion. The left lung appears clear. Heart size is mildly enlarged. IMPRESSION: No change in patchy airspace disease in the right chest worrisome for pneumonia. Small right pleural effusion. Electronically Signed   By: Inge Rise M.D.   On: 03/31/2016 13:06   Dg Chest Port 1 View  Result Date: 03/23/2016 CLINICAL DATA:  Respiratory failure.  Pneumonia. EXAM: PORTABLE CHEST 1 VIEW COMPARISON:  03/19/2016, 03/07/2016, 10/28/2014.  CT 03/03/2016 FINDINGS: Tracheostomy tube and NG tube in stable position. Stable cardiomegaly. Normal pulmonary vascularity. Persistent right perihilar infiltrate. Slight progression of right upper lobe infiltrate. Low lung volumes with right base atelectasis and or infiltrate. No prominent pleural effusion or pneumothorax. Prior cervical thoracic spine fusion . IMPRESSION: 1. Tracheostomy tube and NG tube in stable position . 2. Persistent right perihilar infiltrate. Slight progression of right upper lobe infiltrate. Persistent low lung volumes with right base atelectasis and or infiltrate. 3. Stable cardiomegaly.  No pulmonary venous congestion. Electronically Signed   By: Marcello Moores   Register   On: 03/23/2016 07:27   Dg Abd Portable 1v  Result Date: 03/26/2016 CLINICAL DATA:  Nasogastric tube placement. EXAM: PORTABLE ABDOMEN - 1 VIEW COMPARISON:  Radiograph of March 15, 2016. FINDINGS: Nasogastric tube tip is seen in expected position of proximal stomach. No abnormal bowel dilatation is noted. Moderate amount of stool is noted in the colon. Status post stent graft repair of abdominal aortic aneurysm. IMPRESSION: Nasogastric tube tip seen in expected position of proximal stomach. No evidence of bowel obstruction. Electronically Signed   By: Marijo Conception, M.D.   On: 03/26/2016 07:51     Assessment/Plan   ICD-9-CM ICD-10-CM   1. Chronic neck and back pain 723.1 M54.2    724.5 M54.9   2. Chronic respiratory failure with hypoxia (HCC) 518.83 J96.11    799.02    3. Essential hypertension 401.9 I10   4. Tracheostomy status ([place]) V44.0 Z93.0   5. Recurrent major depressive disorder, remission status unspecified ([place]) 296.30 F33.9   6. Chronic bronchitis, unspecified chronic bronchitis type ([place]) 491.9 J42   7. Gastroesophageal reflux disease, esophagitis presence not specified 530.81 K21.9   8. PEG (percutaneous endoscopic gastrostomy) status (HCC) V44.1 Z93.1    Cont current meds as ordered. On chronic pain meds due to arthritis. Current dose regimen benefits outweigh risks.  PT/OT/ST as ordered  F/u with specialists as scheduled  GOAL: short term rehab and d/c home when medically appropriate. Communicated with pt and nursing.  Will follow  Denis Carreon S. Perlie Gold  [facility] [address] [place], [place] [address] [phone] Cell (Monday-Friday 8 AM - 5 PM) [phone] After 5 PM and follow prompts

## 2016-04-21 ENCOUNTER — Ambulatory Visit: Payer: Medicare (Managed Care) | Admitting: Physician Assistant

## 2016-04-23 IMAGING — CR DG CHEST 1V PORT
1 series · 1 of 1 positions shown · non-contrast
Comparison: 08/18/2014.

CLINICAL DATA: Shortness of breath.  Initial encounter.

EXAM:
PORTABLE CHEST - 1 VIEW

[ap]
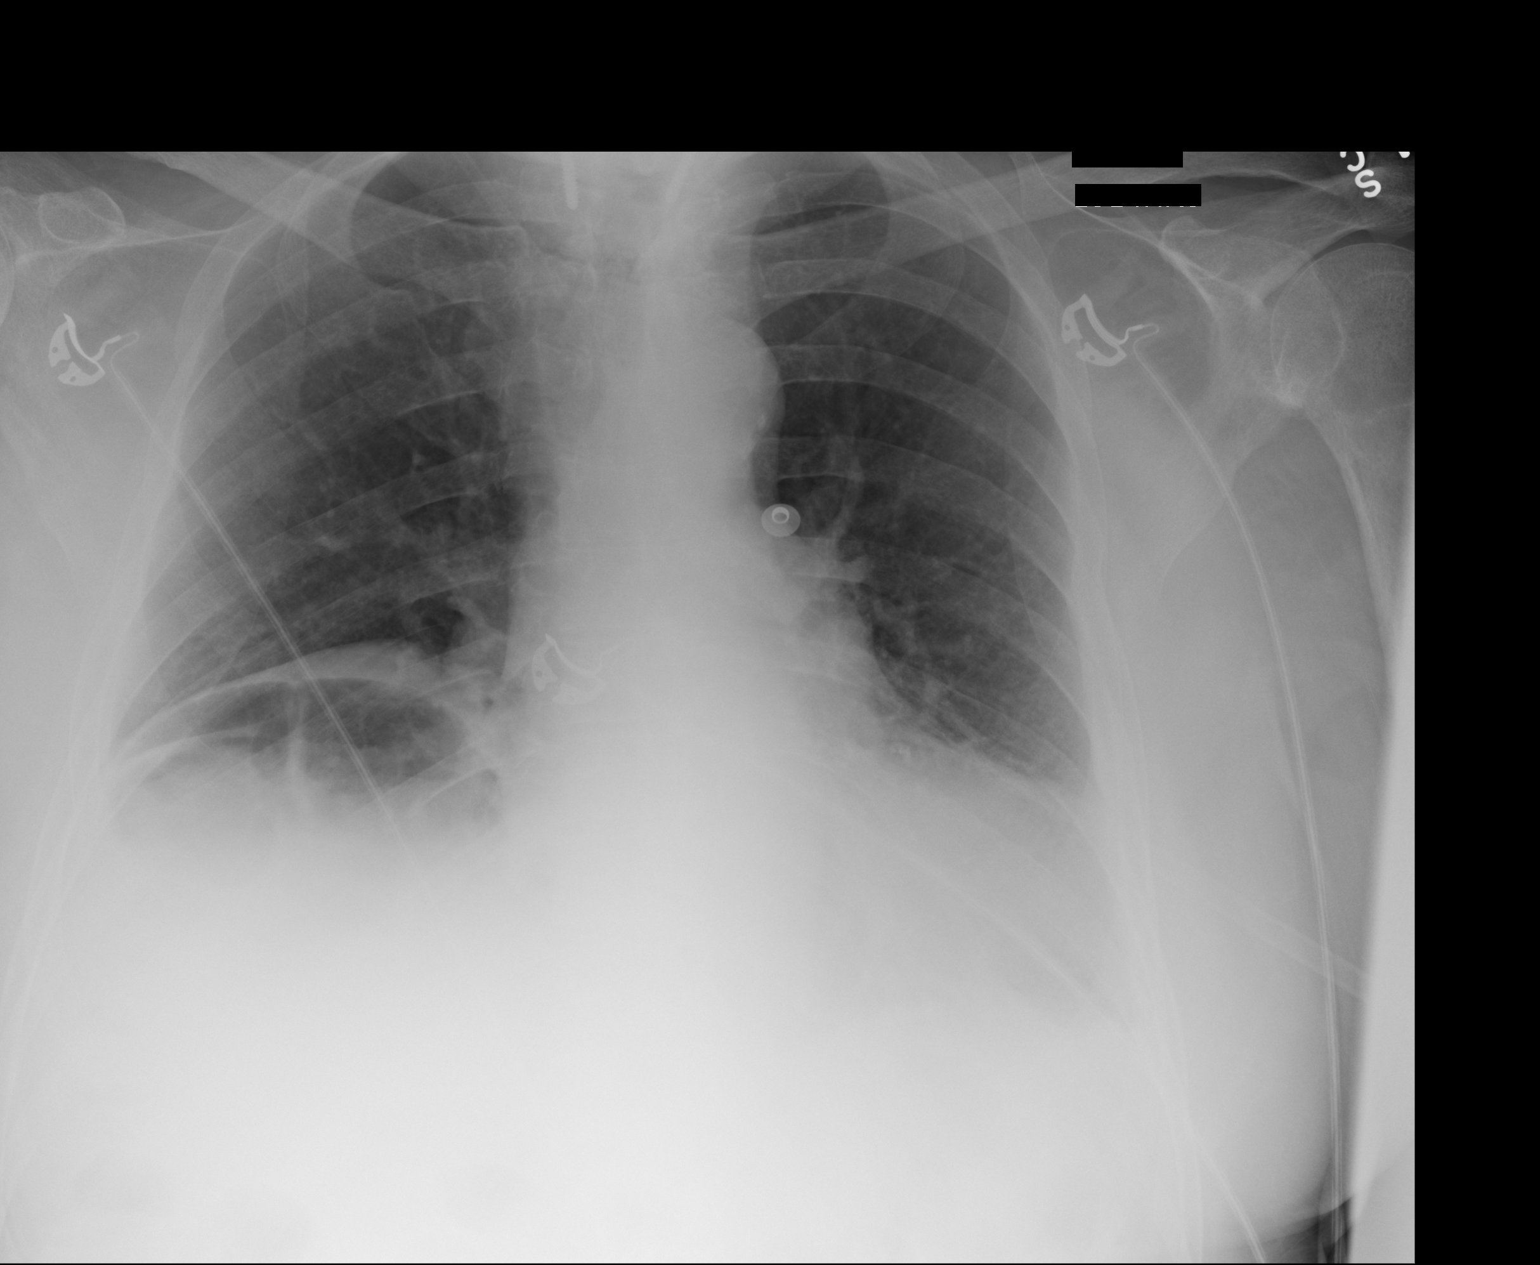

[1 of 1 positions shown; findings below may reference images not displayed]

FINDINGS: Low lung volumes are again present. Colonic interposition is present
under the RIGHT hemidiaphragm which appears similar to prior exam.
Bilateral basilar atelectasis. The cardiopericardial silhouette is
partially obscured but appears similar to the prior exam. No
airspace consolidation. Monitoring leads project over the chest.
Mediastinal contours appear similar. Rod and screw fixation of the
lower cervical spine noted.
IMPRESSION: No interval change or acute cardiopulmonary disease. Bilateral
basilar atelectasis.

## 2016-04-28 ENCOUNTER — Encounter: Payer: Self-pay | Admitting: Adult Health

## 2016-04-28 ENCOUNTER — Non-Acute Institutional Stay (SKILLED_NURSING_FACILITY): Payer: Medicare Other | Admitting: Adult Health

## 2016-04-28 DIAGNOSIS — R112 Nausea with vomiting, unspecified: Secondary | ICD-10-CM

## 2016-04-28 NOTE — Progress Notes (Signed)
Patient ID: Chad Huffman, male   DOB: [birth year], 70 y.o.   MRN: [id number]    Location:    Nursing Home Room Number: 123-B Place of Service:  SNF (31)   CODE STATUS: DNR  Allergies  Allergen Reactions  . Ampicillin Swelling and Other (See Comments)    Unable to obtain enough information to answer additional questions about this medication.  Has patient had a PCN reaction causing immediate rash, facial/tongue/throat swelling, SOB or lightheadedness with hypotension: Yes Has patient had a PCN reaction causing severe rash involving mucus membranes or skin necrosis: No Has patient had a PCN reaction that required hospitalization No Has patient had a PCN reaction occurring within the last 10 years: Yes If all of the above answers are "NO", then may pr  . Nortriptyline Other (See Comments)    Sleep walking  . Brimonidine Other (See Comments)    Reaction:  Unknown   . Cyclobenzaprine Other (See Comments)    Reaction: Hallucinations  . Tizanidine Other (See Comments)    Reaction: Hallucinations  . Gabapentin Rash    Chief Complaint  Patient presents with  . Acute Visit    Nausea and vomiting    HPI:  He is complaining of nausea and vomiting without diarrhea. He states he is unable to keep anything down. He feels as though food is getting stuck in his stomach. There are no reports of fevers present.   Past Medical History:  Diagnosis Date  . Antalgic gait   . Carotid artery occlusion   . Chronic neck and back pain   . Complication of anesthesia    pt has had cervical fusion-limited neck flextion  . COPD (chronic obstructive pulmonary disease) (HCC)   . Depression   . DJD (degenerative joint disease)    Right shoulder  . Hypertension   . Lumbago   . Peripheral neuropathy [facility])     Past Surgical History:  Procedure Laterality Date  . ABDOMINAL AORTIC ANEURYSM REPAIR  2009   Endovascular AAA repair  . ABDOMINAL AORTIC ANEURYSM REPAIR    . CARPAL TUNNEL RELEASE     bilateral CTS  . HAMMER TOE SURGERY  12/10/2011   Procedure: HAMMER TOE CORRECTION;  Surgeon: Ernestene Kiel, DPM;  Location: [facility];  Service: Podiatry;  Laterality: Right;  Right hammertoe repair second right with 2.5x42 orthopro screw   . IR GENERIC HISTORICAL  04/05/2016   IR GASTROSTOMY TUBE MOD SED 04/05/2016 [facility]-INTER[facility] RAD  . METATARSAL OSTEOTOMY  12/10/2011   Procedure: METATARSAL OSTEOTOMY;  Surgeon: Ernestene Kiel, DPM;  Location:  SURGERY CENTER;  Service: Podiatry;  Laterality: Right;  Right Keller arthroplasty with size 5 silicone implant right great toe; second metatarsal osteotomy with 2.0x14 screw  . NECK SURGERY    . PERIPHERAL VASCULAR CATHETERIZATION N/A 02/11/2015   Procedure: Carotid Angiography;  Surgeon: Nada Libman, MD;  Location: [facility] INVASIVE CV LAB;  Service: Cardiovascular;  Laterality: N/A;  . PILONIDAL CYST / SINUS EXCISION    . Rotary cuff    . SPINE SURGERY  09/15/11   Scar tissue removed- back  . SPINE SURGERY  09/15/11   Scar tissue removed- back  . TONSILLECTOMY    . TRACHEOSTOMY TUBE PLACEMENT N/A 03/03/2016   Procedure: TRACHEOSTOMY;  Surgeon: Geanie Logan, MD;  Location: [facility] ORS;  Service: ENT;  Laterality: N/A;    Social History   Social History  . Marital status: Single    Spouse name: N/A  . Number  of children: N/A  . Years of education: N/A   Occupational History  . Not on file.   Social History Main Topics  . Smoking status: Former Smoker    Quit date: 08/30/2006  . Smokeless tobacco: Never Used  . Alcohol use 1.2 oz/week    2 Cans of beer per week     Comment: occasional use  . Drug use: No  . Sexual activity: Not on file   Other Topics Concern  . Not on file   Social History Narrative  . No narrative on file   Family History  Problem Relation Age of Onset  . Cancer Mother     Stomach  . Hypertension Brother   . Heart attack Father       VITAL SIGNS BP 140/76   Pulse 84   Temp 97.6 F (36.4 C)  (Oral)   Resp 20   Ht 5\' 10"  (1.778 m)   Wt 201 lb 4 oz (91.3 kg)   SpO2 98%   BMI 28.88 kg/m   Patient's Medications  New Prescriptions   No medications on file  Previous Medications   BUDESONIDE (PULMICORT) 0.5 MG/2ML NEBULIZER SOLUTION    Take 0.5 mg by nebulization 2 (two) times daily.   CLONAZEPAM (KLONOPIN) 0.5 MG TABLET    Take 0.5 mg by mouth 2 (two) times daily.   DOCUSATE SODIUM (COLACE) 100 MG CAPSULE    100 mg 2 (two) times daily.   ENOXAPARIN (LOVENOX) 40 MG/0.4ML INJECTION    Inject 40 mg into the skin daily.   FAMOTIDINE (PEPCID AC) 10 MG CHEWABLE TABLET    Place 20 mg into feeding tube 2 (two) times daily.   FOLIC ACID (FOLVITE) 1 MG TABLET    Take 1 mg by mouth daily.   IPRATROPIUM-ALBUTEROL (DUONEB) 0.5-2.5 (3) MG/3ML SOLN    Take 3 mLs by nebulization 3 (three) times daily.   METOCLOPRAMIDE HCL (REGLAN PO)    2.5 mg by Percutaneous route every 6 (six) hours.   MULTIPLE VITAMIN (MULTIVITAMIN WITH MINERALS) TABS TABLET    Take 1 tablet by mouth daily.   OXYCODONE (OXY IR/ROXICODONE) 5 MG IMMEDIATE RELEASE TABLET    Take 5 mg by mouth every 8 (eight) hours.   OXYGEN    Inhale into the lungs. 4L/ MIN via trach PRN   PREDNISONE (DELTASONE) 5 MG TABLET    Place 5 mg into feeding tube daily.   QUETIAPINE (SEROQUEL) 50 MG TABLET    Take 150 mg by mouth at bedtime.   SERTRALINE HCL (ZOLOFT PO)    Place 75 mg into feeding tube daily.   TORSEMIDE (DEMADEX) 20 MG TABLET    Take 20 mg by mouth daily.  Modified Medications   No medications on file  Discontinued Medications   No medications on file     SIGNIFICANT DIAGNOSTIC EXAMS   04-06-16: chest x-ray: Shallow inspiration with bibasilar atelectasis versus infiltrate. Small right pleural effusion.    LABS REVIEWED:   03-10-16: tsh 6.972; hgb a1c 5.4 04-11-16: wbc 16.5; hgb 11.4; hct 40.8; mcv 104.1; plt 141; glucose 166; bun 65; creat 1.14; k+ 4.3; na++ 152 04-13-16: wbc 19.6; hgb 10.5; hct 34.9; mcv 102.5; plt 107;  glucose 109; bun 52.1; creat 0.97; k+ 4.1; na++ 147     Review of Systems  Constitutional: Negative for malaise/fatigue.  Respiratory: Negative for cough and shortness of breath.   Cardiovascular: Negative for chest pain, palpitations and leg swelling.  Gastrointestinal: nausea vomiting no diarrhea  Musculoskeletal: Negative for back pain, joint pain and myalgias.  Skin: Negative.   Neurological: Negative for dizziness.  Psychiatric/Behavioral: The patient is not nervous/anxious.    Physical Exam  Constitutional: No distress.  Eyes: Conjunctivae are normal.  Neck: Neck supple. No JVD present. No thyromegaly present.  Cardiovascular: Normal rate, regular rhythm and intact distal pulses.   Respiratory: Effort normal and breath sounds normal. No respiratory distress. He has no wheezes.  Janina Mayorach is capped   GI: Soft. Bowel sounds are normal. He exhibits no distension. There is no tenderness. Negative for impaction  Peg tube present   Musculoskeletal: He exhibits no edema.  Able to move all extremities   Lymphadenopathy:    He has no cervical adenopathy.  Neurological: He is alert.  Skin: Skin is warm and dry. He is not diaphoretic.  Psychiatric: He has a normal mood and affect.    ASSESSMENT/ PLAN:  1. Nausea and vomiting: will check cbc; cmp; chest x-ray; kub; will begin reglan 2.5 mg every 6 hours to help with gastric emptying.    Time spent with patient  30  minutes >50% time spent counseling; reviewing medical record; tests; labs; and developing future plan of care   MD is aware of resident's narcotic use and is in agreement with current plan of care. We will attempt to wean resident as apropriate   Synthia Innocenteborah Delia Slatten NP [facility]  Contact [phone] Monday through Friday 8am- 5pm  After hours call [phone]

## 2016-05-07 ENCOUNTER — Encounter: Payer: Self-pay | Admitting: Adult Health

## 2016-05-07 ENCOUNTER — Non-Acute Institutional Stay (SKILLED_NURSING_FACILITY): Payer: Medicare Other | Admitting: Adult Health

## 2016-05-07 DIAGNOSIS — R600 Localized edema: Secondary | ICD-10-CM

## 2016-05-07 DIAGNOSIS — J9622 Acute and chronic respiratory failure with hypercapnia: Secondary | ICD-10-CM

## 2016-05-07 DIAGNOSIS — F418 Other specified anxiety disorders: Secondary | ICD-10-CM

## 2016-05-07 DIAGNOSIS — K5901 Slow transit constipation: Secondary | ICD-10-CM | POA: Diagnosis not present

## 2016-05-07 DIAGNOSIS — K219 Gastro-esophageal reflux disease without esophagitis: Secondary | ICD-10-CM | POA: Diagnosis not present

## 2016-05-07 DIAGNOSIS — J441 Chronic obstructive pulmonary disease with (acute) exacerbation: Secondary | ICD-10-CM

## 2016-05-07 DIAGNOSIS — R1314 Dysphagia, pharyngoesophageal phase: Secondary | ICD-10-CM | POA: Diagnosis not present

## 2016-05-07 NOTE — Progress Notes (Signed)
Patient ID: Chad Huffman, male   DOB: 08-Jul-1945, 70 y.o.   MRN: [id number]   Location:   [facility] Room Number: 123-B Place of Service:  SNF (31)   CODE STATUS: DNR  Allergies  Allergen Reactions  . Ampicillin Swelling and Other (See Comments)    Unable to obtain enough information to answer additional questions about this medication.  Has patient had a PCN reaction causing immediate rash, facial/tongue/throat swelling, SOB or lightheadedness with hypotension: Yes Has patient had a PCN reaction causing severe rash involving mucus membranes or skin necrosis: No Has patient had a PCN reaction that required hospitalization No Has patient had a PCN reaction occurring within the last 10 years: Yes If all of the above answers are "NO", then may pr  . Nortriptyline Other (See Comments)    Sleep walking  . Brimonidine Other (See Comments)    Reaction:  Unknown   . Cyclobenzaprine Other (See Comments)    Reaction: Hallucinations  . Tizanidine Other (See Comments)    Reaction: Hallucinations  . Gabapentin Rash    Chief Complaint  Patient presents with  . Medical Management of Chronic Issues    Follow up    HPI:  He is a resident of this facility being seen for the management of his chronic illnesses.  He is tolerating his peg tube feedings without further nausea or vomiting. He is not voicing any complaints at this time. There are no nursing concerns at this time.    Past Medical History:  Diagnosis Date  . Antalgic gait   . Carotid artery occlusion   . Chronic neck and back pain   . Complication of anesthesia    pt has had cervical fusion-limited neck flextion  . COPD (chronic obstructive pulmonary disease) (HCC)   . Depression   . DJD (degenerative joint disease)    Right shoulder  . Hypertension   . Lumbago   . Peripheral neuropathy [facility])     Past Surgical History:  Procedure Laterality Date  . ABDOMINAL AORTIC ANEURYSM REPAIR  2009   Endovascular AAA  repair  . ABDOMINAL AORTIC ANEURYSM REPAIR    . CARPAL TUNNEL RELEASE     bilateral CTS  . HAMMER TOE SURGERY  12/10/2011   Procedure: HAMMER TOE CORRECTION;  Surgeon: Ernestene Kiel, DPM;  Location: [facility];  Service: Podiatry;  Laterality: Right;  Right hammertoe repair second right with 2.5x42 orthopro screw   . IR GENERIC HISTORICAL  04/05/2016   IR GASTROSTOMY TUBE MOD SED 04/05/2016 [facility]INTER[facility] RAD  . METATARSAL OSTEOTOMY  12/10/2011   Procedure: METATARSAL OSTEOTOMY;  Surgeon: Ernestene Kiel, DPM;  Location: [facility];  Service: Podiatry;  Laterality: Right;  Right Keller arthroplasty with size 5 silicone implant right great toe; second metatarsal osteotomy with 2.0x14 screw  . NECK SURGERY    . PERIPHERAL VASCULAR CATHETERIZATION N/A 02/11/2015   Procedure: Carotid Angiography;  Surgeon: Nada Libman, MD;  Location: [facility] INVASIVE CV LAB;  Service: Cardiovascular;  Laterality: N/A;  . PILONIDAL CYST / SINUS EXCISION    . Rotary cuff    . SPINE SURGERY  09/15/11   Scar tissue removed- back  . SPINE SURGERY  09/15/11   Scar tissue removed- back  . TONSILLECTOMY    . TRACHEOSTOMY TUBE PLACEMENT N/A 03/03/2016   Procedure: TRACHEOSTOMY;  Surgeon: Geanie Logan, MD;  Location: [facility] ORS;  Service: ENT;  Laterality: N/A;    Social History   Social History  .  Marital status: Single    Spouse name: N/A  . Number of children: N/A  . Years of education: N/A   Occupational History  . Not on file.   Social History Main Topics  . Smoking status: Former Smoker    Quit date: 08/30/2006  . Smokeless tobacco: Never Used  . Alcohol use 1.2 oz/week    2 Cans of beer per week     Comment: occasional use  . Drug use: No  . Sexual activity: Not on file   Other Topics Concern  . Not on file   Social History Narrative  . No narrative on file   Family History  Problem Relation Age of Onset  . Cancer Mother     Stomach  . Hypertension Brother   . Heart attack  Father       VITAL SIGNS BP (!) 151/77   Pulse 80   Temp 97.2 F (36.2 C) (Oral)   Resp 18   Ht 5\' 10"  (1.778 m)   Wt 201 lb 4 oz (91.3 kg)   SpO2 91%   BMI 28.88 kg/m   Patient's Medications  New Prescriptions   No medications on file  Previous Medications   BUDESONIDE (PULMICORT) 0.5 MG/2ML NEBULIZER SOLUTION    Take 0.5 mg by nebulization 2 (two) times daily.   CLONAZEPAM (KLONOPIN) 0.5 MG TABLET    Take 0.5 mg by mouth 2 (two) times daily.   DOCUSATE SODIUM (COLACE) 100 MG CAPSULE    100 mg 2 (two) times daily.   ENOXAPARIN (LOVENOX) 40 MG/0.4ML INJECTION    Inject 40 mg into the skin daily.   FAMOTIDINE (PEPCID AC) 10 MG CHEWABLE TABLET    Place 20 mg into feeding tube 2 (two) times daily.   FOLIC ACID (FOLVITE) 1 MG TABLET    Take 1 mg by mouth daily.   IPRATROPIUM-ALBUTEROL (DUONEB) 0.5-2.5 (3) MG/3ML SOLN    Take 3 mLs by nebulization 3 (three) times daily.   METOCLOPRAMIDE HCL (REGLAN PO)    2.5 mg by Percutaneous route every 6 (six) hours.   MULTIPLE VITAMIN (MULTIVITAMIN WITH MINERALS) TABS TABLET    Take 1 tablet by mouth daily.   OXYCODONE (OXY IR/ROXICODONE) 5 MG IMMEDIATE RELEASE TABLET    Take 5 mg by mouth every 8 (eight) hours.   OXYGEN    Inhale into the lungs. 4L/ MIN via trach PRN   PREDNISONE (DELTASONE) 5 MG TABLET    Place 5 mg into feeding tube daily.   QUETIAPINE (SEROQUEL) 50 MG TABLET    Take 150 mg by mouth at bedtime.   SERTRALINE HCL (ZOLOFT PO)    Place 75 mg into feeding tube daily.   TORSEMIDE (DEMADEX) 20 MG TABLET    Take 20 mg by mouth daily.  Modified Medications   No medications on file  Discontinued Medications   No medications on file     SIGNIFICANT DIAGNOSTIC EXAMS   04-06-16: chest x-ray: Shallow inspiration with bibasilar atelectasis versus infiltrate. Small right pleural effusion.    LABS REVIEWED:   03-10-16: tsh 6.972; hgb a1c 5.4 04-11-16: wbc 16.5; hgb 11.4; hct 40.8; mcv 104.1; plt 141; glucose 166; bun 65; creat  1.14; k+ 4.3; na++ 152 04-13-16: wbc 19.6; hgb 10.5; hct 34.9; mcv 102.5; plt 107; glucose 109; bun 52.1; creat 0.97; k+ 4.1; na++ 147  04-28-16: wbc 4.8; hgb 10.0; hct 31.6; mcv 97.4; plt 140; glucose 128; bun 27.0; creat 0.71; k+ 4.2; na++ 133; liver normal albumin  3.3    Review of Systems  Constitutional: Negative for malaise/fatigue.  Respiratory: Negative for cough and shortness of breath.   Cardiovascular: Negative for chest pain, palpitations and leg swelling.  Gastrointestinal: negative for nausea vomiting and abdominal pain  Musculoskeletal: Negative for back pain, joint pain and myalgias.  Skin: Negative.   Neurological: Negative for dizziness.  Psychiatric/Behavioral: The patient is not nervous/anxious.    Physical Exam  Constitutional: No distress.  Eyes: Conjunctivae are normal.  Neck: Neck supple. No JVD present. No thyromegaly present.  Cardiovascular: Normal rate, regular rhythm and intact distal pulses.   Respiratory: Effort normal and breath sounds normal. No respiratory distress. He has no wheezes.  Janina Mayorach is capped   GI: Soft. Bowel sounds are normal. He exhibits no distension. There is no tenderness.   Peg tube present   Musculoskeletal: He exhibits no edema.  Able to move all extremities   Lymphadenopathy:    He has no cervical adenopathy.  Neurological: He is alert.  Skin: Skin is warm and dry. He is not diaphoretic.  Psychiatric: He has a normal mood and affect.   ASSESSMENT/ PLAN:  1. Chronic respiratory failure with COPD: will continue pulmicort neb twice daily; duoneb every 6 hours; is on prednisone taper with 5 mg daily long term  2. Dysphagia: does have peg tube; no signs of aspiration present; will continue current plan of care; does have scopolamine patch for excessive oral secretions  3. gerd: will continue pepcid 20 mg twice daily will continue reglan 2.5 mg every 6 hours for gastric emptying   4. Depression with anxiety: will continue klonopin  0.5 mg twice daily and zoloft 75 mg daily and will continue seroquel xr 150 mg daily  5. Bilateral lower extremity edema: will continue demadex 20 mg daily   6. Constipation: will continue colace twice daily     MD is aware of resident's narcotic use and is in agreement with current plan of care. We will attempt to wean resident as apropriate   Synthia Innocenteborah Bernardine Langworthy NP [facility]  Contact [phone] Monday through Friday 8am- 5pm  After hours call [phone]

## 2016-05-09 DIAGNOSIS — K5901 Slow transit constipation: Secondary | ICD-10-CM | POA: Insufficient documentation

## 2016-05-09 DIAGNOSIS — F418 Other specified anxiety disorders: Secondary | ICD-10-CM | POA: Insufficient documentation

## 2016-05-09 DIAGNOSIS — R600 Localized edema: Secondary | ICD-10-CM | POA: Insufficient documentation

## 2016-05-09 DIAGNOSIS — K219 Gastro-esophageal reflux disease without esophagitis: Secondary | ICD-10-CM | POA: Insufficient documentation

## 2016-05-10 ENCOUNTER — Encounter (HOSPITAL_COMMUNITY): Payer: Self-pay

## 2016-05-10 ENCOUNTER — Ambulatory Visit: Payer: Medicare (Managed Care) | Admitting: Physician Assistant

## 2016-05-10 ENCOUNTER — Ambulatory Visit: Payer: Self-pay | Admitting: Surgery

## 2016-05-10 ENCOUNTER — Other Ambulatory Visit (HOSPITAL_COMMUNITY): Payer: Self-pay

## 2016-05-11 ENCOUNTER — Other Ambulatory Visit: Payer: Self-pay | Admitting: Internal Medicine

## 2016-05-11 DIAGNOSIS — E46 Unspecified protein-calorie malnutrition: Secondary | ICD-10-CM

## 2016-05-12 ENCOUNTER — Encounter (HOSPITAL_COMMUNITY): Payer: Self-pay | Admitting: Interventional Radiology

## 2016-05-12 ENCOUNTER — Ambulatory Visit (HOSPITAL_COMMUNITY)
Admission: RE | Admit: 2016-05-12 | Discharge: 2016-05-12 | Disposition: A | Discharge status: Home / Self Care | Payer: Medicare Other | Source: Ambulatory Visit | Attending: Interventional Radiology | Admitting: Interventional Radiology

## 2016-05-12 ENCOUNTER — Other Ambulatory Visit: Payer: Self-pay | Admitting: Internal Medicine

## 2016-05-12 DIAGNOSIS — E46 Unspecified protein-calorie malnutrition: Secondary | ICD-10-CM

## 2016-05-12 DIAGNOSIS — T85848A Pain due to other internal prosthetic devices, implants and grafts, initial encounter: Secondary | ICD-10-CM | POA: Insufficient documentation

## 2016-05-12 DIAGNOSIS — Y833 Surgical operation with formation of external stoma as the cause of abnormal reaction of the patient, or of later complication, without mention of misadventure at the time of the procedure: Secondary | ICD-10-CM | POA: Diagnosis not present

## 2016-05-12 HISTORY — PX: IR GENERIC HISTORICAL: IMG1180011

## 2016-05-12 MED ORDER — IOPAMIDOL (ISOVUE-300) INJECTION 61%
INTRAVENOUS | Status: AC
  Administered 2016-05-12: 20 mL
  Filled 2016-05-12: qty 50

## 2016-05-17 ENCOUNTER — Ambulatory Visit (INDEPENDENT_AMBULATORY_CARE_PROVIDER_SITE_OTHER): Payer: Medicare Other | Admitting: Physician Assistant

## 2016-05-17 ENCOUNTER — Encounter: Payer: Self-pay | Admitting: Physician Assistant

## 2016-05-17 VITALS — BP 124/70 | HR 84 | Ht 68.0 in | Wt 202.0 lb

## 2016-05-17 DIAGNOSIS — R1319 Other dysphagia: Secondary | ICD-10-CM | POA: Diagnosis not present

## 2016-05-17 NOTE — Progress Notes (Signed)
Reviewed and agree with management plan.  Leba Tibbitts T. Raeford Brandenburg, MD FACG 

## 2016-05-17 NOTE — Patient Instructions (Signed)
You have been scheduled for a Barium Esophogram at [facility] [facility] [place]  on Friday 12-1 at 9:30 am Please arrive at 9:15 am to your appointment for registration. Make certain not to have anything to eat or drink 3 hours prior to your test. If you need to reschedule for any reason, please contact radiology at [phone] to do so. __________________________________________________________________ A barium swallow is an examination that concentrates on views of the esophagus. This tends to be a double contrast exam (barium and two liquids which, when combined, create a gas to distend the wall of the oesophagus) or single contrast (non-ionic iodine based). The study is usually tailored to your symptoms so a good history is essential. Attention is paid during the study to the form, structure and configuration of the esophagus, looking for functional disorders (such as aspiration, dysphagia, achalasia, motility and reflux) EXAMINATION You may be asked to change into a gown, depending on the type of swallow being performed. A radiologist and radiographer will perform the procedure. The radiologist will advise you of the type of contrast selected for your procedure and direct you during the exam. You will be asked to stand, sit or lie in several different positions and to hold a small amount of fluid in your mouth before being asked to swallow while the imaging is performed .In some instances you may be asked to swallow barium coated marshmallows to assess the motility of a solid food bolus. The exam can be recorded as a digital or video fluoroscopy procedure. POST PROCEDURE It will take 1-2 days for the barium to pass through your system. To facilitate this, it is important, unless otherwise directed, to increase your fluids for the next 24-48hrs and to resume your normal diet.  This test typically takes about 30 minutes to perform. __________________________________________________________________________________

## 2016-05-17 NOTE — Progress Notes (Signed)
Subjective:    Patient ID: Chad Huffman, male    DOB: [birth year], 70 y.o.   MRN: [id number]  HPI Chad Huffman is a pleasant 70 year old white male, new to GI today, referred by Kirt Boys D.O./[facility]. Patient is currently a nursing home resident at [facility]. He is referred for evaluation of possible esophageal stricture. Patient was hospitalized in September 2017 at [facility] with acute respiratory failure, and severe pneumonia. He required intubation and  emergency trach placement. He was then transferred to [facility] and weaned from ventilator. He was transferred to the nursing home within the past month. He underwent PEG placement on 04/05/2016 per IR. Records are reviewed and records available in Epic-with no mention about concerns for esophageal issues or esophageal stricture. I called [facility] nursing home and was able to speak with a speech pathologist there who related that he had had a speech path swallowing eval done while he was [facility] that showed aspiration of all consistencies and concern for underlying esophageal stenosis, however this was done with a long trach in place. Patient also has a hardware in his neck from previous surgery. Patient states that he is been tolerating a full liquid diet without difficulty, has no difficulty swallowing the full liquids, no coughing and choking etc. He is anxious to get back to eating regular food and is hoping to have the Trach removed. He states that he had no prior issues with dysphagia reflux or esophageal strictures prior to this acute illness. He is known to occur notable GI Dr. Marva Panda and states that he's had prior colonoscopies but never had an EGD. He is aware that he has severe COPD, was on home O2 primarily at nighttime prior to his recent acute illness, and states that his breathing is back to his baseline. He is currently being supplemented by PEG feedings  in the evenings.  Review of Systems Pertinent positive and negative review of systems were noted in the above HPI section.  All other review of systems was otherwise negative.  Outpatient Encounter Prescriptions as of 05/17/2016  Medication Sig  . budesonide (PULMICORT) 0.5 MG/2ML nebulizer solution Take 0.5 mg by nebulization 2 (two) times daily.  . clonazePAM (KLONOPIN) 0.5 MG tablet Take 0.5 mg by mouth 2 (two) times daily.  Marland Kitchen docusate sodium (COLACE) 100 MG capsule 100 mg 2 (two) times daily.  Marland Kitchen enoxaparin (LOVENOX) 40 MG/0.4ML injection Inject 40 mg into the skin daily.  . famotidine (PEPCID) 20 MG tablet Take 20 mg by mouth 2 (two) times daily.  . folic acid (FOLVITE) 1 MG tablet Take 1 mg by mouth daily.  Marland Kitchen ipratropium-albuterol (DUONEB) 0.5-2.5 (3) MG/3ML SOLN Take 3 mLs by nebulization 3 (three) times daily.  . metoCLOPramide (REGLAN) 5 MG tablet Place 2.5 mg into feeding tube every 6 (six) hours.  . Multiple Vitamin (MULTIVITAMIN WITH MINERALS) TABS tablet Take 1 tablet by mouth daily.  Marland Kitchen oxyCODONE (OXY IR/ROXICODONE) 5 MG immediate release tablet Take 5 mg by mouth every 8 (eight) hours.  . OXYGEN Inhale into the lungs. 4L/ MIN via trach PRN  . predniSONE (DELTASONE) 5 MG tablet Take 5 mg by mouth daily.  . QUEtiapine (SEROQUEL) 50 MG tablet Take 150 mg by mouth at bedtime.  . sertraline (ZOLOFT) 50 MG tablet Take 75 mg by mouth daily.  Marland Kitchen torsemide (DEMADEX) 20 MG tablet Take 20 mg by mouth daily.  . [DISCONTINUED] predniSONE (DELTASONE) 5 MG tablet Place  5 mg into feeding tube daily.   No facility-administered encounter medications on file as of 05/17/2016.    Allergies  Allergen Reactions  . Ampicillin Swelling and Other (See Comments)    Unable to obtain enough information to answer additional questions about this medication.  Has patient had a PCN reaction causing immediate rash, facial/tongue/throat swelling, SOB or lightheadedness with hypotension: Yes Has patient had a  PCN reaction causing severe rash involving mucus membranes or skin necrosis: No Has patient had a PCN reaction that required hospitalization No Has patient had a PCN reaction occurring within the last 10 years: Yes If all of the above answers are "NO", then may pr  . Nortriptyline Other (See Comments)    Sleep walking  . Brimonidine Other (See Comments)    Reaction:  Unknown   . Cyclobenzaprine Other (See Comments)    Reaction: Hallucinations  . Tizanidine Other (See Comments)    Reaction: Hallucinations  . Gabapentin Rash   Patient Active Problem List   Diagnosis Date Noted  . GERD without esophagitis 05/09/2016  . Bilateral lower extremity edema 05/09/2016  . Slow transit constipation 05/09/2016  . Depression with anxiety 05/09/2016  . Respiratory failure with hypoxia and hypercapnia (HCC) 07/22/2015  . Cervical vertebral fusion   . Depression   . Chronic neck and back pain   . Acute on chronic respiratory failure with hypercapnia (HCC)   . COPD exacerbation (HCC)   . HCAP (healthcare-associated pneumonia)   . Congestive dilated cardiomyopathy (HCC)   . Syncope and collapse   . Arthritis of right hip 08/04/2014  . Essential hypertension 08/04/2014  . Hypothyroidism 08/04/2014  . Syncope 08/04/2014  . Fall 08/04/2014   Social History   Social History  . Marital status: Single    Spouse name: N/A  . Number of children: N/A  . Years of education: N/A   Occupational History  . Not on file.   Social History Main Topics  . Smoking status: Former Smoker    Quit date: 08/30/2006  . Smokeless tobacco: Never Used  . Alcohol use 1.2 oz/week    2 Cans of beer per week     Comment: occasional use  . Drug use: No  . Sexual activity: Not on file   Other Topics Concern  . Not on file   Social History Narrative  . No narrative on file    Chad Huffman's family history includes Heart attack in his father; Hypertension in his brother; Stomach cancer in his mother.        Objective:    Vitals:   05/17/16 1511  BP: 124/70  Pulse: 84    Physical Exam  well-developed older white male in no acute distress, he is in a wheelchair, on 3 L O2 nasal cannula and has a capped trach. Speech very clear. Blood pressure 124/70 pulse 84, height 5 foot 8 weight 202 BMI 30.7. HEENT; nontraumatic normocephalic EOMI PERRLA sclera anicteric neck with trach, cardiovascular; regular rate and rhythm with S1-S2, soft systolic murmur, Pulmonary; decreased breath sounds bilaterally, Abdomen; soft nontender nondistended bowel sounds are present he has a PEG tube in place, Rectal ;exam not done, Extremities; no clubbing cyanosis or edema skin warm and dry, Neuropsych; mood and affect appropriate       Assessment & Plan:   #[id number] 70 year old white male referred for concerns regarding possible esophageal stricture in the setting of recent hospitalization with respiratory failure secondary to severe pneumonia requiring vent support and emergency trach in  a patient with severe O2 dependent COPD. Apparently speech path modified swallowing evaluation done during patient stay at [facility] was positive for aspiration and there was concern for underlying esophageal disease Patient is currently tolerating full liquids without difficulty, and trach has been capped for several weeks. Patient has no prior history of GERD or esophageal issues.  #2 status post PEG placement October 2017 #3 anticoagulation-patient currently on Lovenox  Plan;  Will obtain formal barium swallow to assess for possible underlying esophageal stricture. He  is a poor candidate for sedation and will try to avoid endoscopic procedures unless indicated for dilation of significant esophageal stricture. Patient would  require procedure being performed at the hospital, and off Lovenox. Patient will be established with Dr. Russella DarStark.    Amy S Esterwood PA-C 05/17/2016   Cc: Kirt Boysarter, Monica, DO

## 2016-05-18 ENCOUNTER — Encounter: Payer: Self-pay | Admitting: Adult Health

## 2016-05-18 ENCOUNTER — Non-Acute Institutional Stay (SKILLED_NURSING_FACILITY): Payer: Medicare Other | Admitting: Adult Health

## 2016-05-18 DIAGNOSIS — R1314 Dysphagia, pharyngoesophageal phase: Secondary | ICD-10-CM | POA: Diagnosis not present

## 2016-05-18 DIAGNOSIS — Z931 Gastrostomy status: Secondary | ICD-10-CM

## 2016-05-18 NOTE — Progress Notes (Signed)
Patient ID: Chad Huffman, male   DOB: 11/18/1945, 70 y.o.   MRN: [id number]   Location:   [facility] Room Number: 123-B Place of Service:  SNF (31)   CODE STATUS: DNR  Allergies  Allergen Reactions  . Ampicillin Swelling and Other (See Comments)    Unable to obtain enough information to answer additional questions about this medication.  Has patient had a PCN reaction causing immediate rash, facial/tongue/throat swelling, SOB or lightheadedness with hypotension: Yes Has patient had a PCN reaction causing severe rash involving mucus membranes or skin necrosis: No Has patient had a PCN reaction that required hospitalization No Has patient had a PCN reaction occurring within the last 10 years: Yes If all of the above answers are "NO", then may pr  . Nortriptyline Other (See Comments)    Sleep walking  . Brimonidine Other (See Comments)    Reaction:  Unknown   . Cyclobenzaprine Other (See Comments)    Reaction: Hallucinations  . Tizanidine Other (See Comments)    Reaction: Hallucinations  . Gabapentin Rash    Chief Complaint  Patient presents with  . Acute Visit    Acute    HPI:  He was seen by I/R regarding his peg tube and was found to be in proper place and well functioning. He does have some pain yet with the use of his peg tube. staff reports that he has a significant amount of drainage present around the site. There are no reports of fever present.    Past Medical History:  Diagnosis Date  . Antalgic gait   . Carotid artery occlusion   . Chronic neck and back pain   . Complication of anesthesia    pt has had cervical fusion-limited neck flextion  . COPD (chronic obstructive pulmonary disease) (HCC)   . Depression   . DJD (degenerative joint disease)    Right shoulder  . Hypertension   . Lumbago   . Peripheral neuropathy [facility](HCC)     Past Surgical History:  Procedure Laterality Date  . ABDOMINAL AORTIC ANEURYSM REPAIR  2009   Endovascular AAA repair   . ABDOMINAL AORTIC ANEURYSM REPAIR    . CARPAL TUNNEL RELEASE     bilateral CTS  . HAMMER TOE SURGERY  12/10/2011   Procedure: HAMMER TOE CORRECTION;  Surgeon: Chad Huffman, DPM;  Location: [facility];  Service: Podiatry;  Laterality: Right;  Right hammertoe repair second right with 2.5x42 orthopro screw   . IR GENERIC HISTORICAL  04/05/2016   IR GASTROSTOMY TUBE MOD SED 04/05/2016 [facility]INTER[facility]  . IR GENERIC HISTORICAL  05/12/2016   IR CM INJ ANY COLONIC TUBE W/FLUORO 05/12/2016 Chad MoanHeath McCullough, MD [facility]INTER[facility] RAD  . METATARSAL OSTEOTOMY  12/10/2011   Procedure: METATARSAL OSTEOTOMY;  Surgeon: Chad Huffman, DPM;  Location: [facility];  Service: Podiatry;  Laterality: Right;  Right Keller arthroplasty with size 5 silicone implant right great toe; second metatarsal osteotomy with 2.0x14 screw  . NECK SURGERY    . PERIPHERAL VASCULAR CATHETERIZATION N/A 02/11/2015   Procedure: Carotid Angiography;  Surgeon: Chad LibmanVance W Brabham, MD;  [facility];  Service: Cardiovascular;  Laterality: N/A;  . PILONIDAL CYST / SINUS EXCISION    . Rotary cuff    . SPINE SURGERY  09/15/11   Scar tissue removed- back  . SPINE SURGERY  09/15/11   Scar tissue removed- back  . TONSILLECTOMY    . TRACHEOSTOMY TUBE PLACEMENT N/A 03/03/2016   Procedure:  TRACHEOSTOMY;  Surgeon: Chad LoganPaul Bennett, MD;  Location: [facility] ORS;  Service: ENT;  Laterality: N/A;    Social History   Social History  . Marital status: Single    Spouse name: N/A  . Number of children: N/A  . Years of education: N/A   Occupational History  . Not on file.   Social History Main Topics  . Smoking status: Former Smoker    Quit date: 08/30/2006  . Smokeless tobacco: Never Used  . Alcohol use 1.2 oz/week    2 Cans of beer per week     Comment: occasional use  . Drug use: No  . Sexual activity: Not on file   Other Topics Concern  . Not on file   Social History Narrative  . No narrative on file   Family  History  Problem Relation Age of Onset  . Stomach cancer Mother   . Hypertension Brother   . Heart attack Father       VITAL SIGNS Reviewed   Patient's Medications  New Prescriptions   No medications on file  Previous Medications   BUDESONIDE (PULMICORT) 0.5 MG/2ML NEBULIZER SOLUTION    Take 0.5 mg by nebulization 2 (two) times daily.   CLONAZEPAM (KLONOPIN) 0.5 MG TABLET    Take 0.5 mg by mouth 2 (two) times daily.   DOCUSATE SODIUM (COLACE) 100 MG CAPSULE    100 mg 2 (two) times daily.   ENOXAPARIN (LOVENOX) 40 MG/0.4ML INJECTION    Inject 40 mg into the skin daily.   FAMOTIDINE (PEPCID) 20 MG TABLET    Take 20 mg by mouth 2 (two) times daily.   FOLIC ACID (FOLVITE) 1 MG TABLET    Take 1 mg by mouth daily.   IPRATROPIUM-ALBUTEROL (DUONEB) 0.5-2.5 (3) MG/3ML SOLN    Take 3 mLs by nebulization 3 (three) times daily.   METOCLOPRAMIDE (REGLAN) 5 MG TABLET    Place 2.5 mg into feeding tube every 6 (six) hours.   MULTIPLE VITAMIN (MULTIVITAMIN WITH MINERALS) TABS TABLET    Take 1 tablet by mouth daily.   OXYCODONE (OXY IR/ROXICODONE) 5 MG IMMEDIATE RELEASE TABLET    Take 5 mg by mouth every 8 (eight) hours.   OXYGEN    Inhale into the lungs. 4L/ MIN via trach PRN   PREDNISONE (DELTASONE) 5 MG TABLET    Take 5 mg by mouth daily.   QUETIAPINE (SEROQUEL) 50 MG TABLET    Take 150 mg by mouth at bedtime.   SERTRALINE (ZOLOFT) 50 MG TABLET    Take 75 mg by mouth daily.   TORSEMIDE (DEMADEX) 20 MG TABLET    Take 20 mg by mouth daily.  Modified Medications   No medications on file  Discontinued Medications   No medications on file     SIGNIFICANT DIAGNOSTIC EXAMS   04-06-16: chest x-ray: Shallow inspiration with bibasilar atelectasis versus infiltrate. Small right pleural effusion.    LABS REVIEWED:   03-10-16: tsh 6.972; hgb a1c 5.4 04-11-16: wbc 16.5; hgb 11.4; hct 40.8; mcv 104.1; plt 141; glucose 166; bun 65; creat 1.14; k+ 4.3; na++ 152 04-13-16: wbc 19.6; hgb 10.5; hct 34.9;  mcv 102.5; plt 107; glucose 109; bun 52.1; creat 0.97; k+ 4.1; na++ 147  04-28-16: wbc 4.8; hgb 10.0; hct 31.6; mcv 97.4; plt 140; glucose 128; bun 27.0; creat 0.71; k+ 4.2; na++ 133; liver normal albumin 3.3    Review of Systems  Constitutional: Negative for malaise/fatigue.  Respiratory: Negative for cough and shortness of breath.  Cardiovascular: Negative for chest pain, palpitations and leg swelling.  Gastrointestinal: negative for nausea vomiting and abdominal pain  Musculoskeletal: Negative for back pain, joint pain and myalgias.  Skin: Negative.   Neurological: Negative for dizziness.  Psychiatric/Behavioral: The patient is not nervous/anxious.    Physical Exam  Constitutional: No distress.  Eyes: Conjunctivae are normal.  Neck: Neck supple. No JVD present. No thyromegaly present.  Cardiovascular: Normal rate, regular rhythm and intact distal pulses.   Respiratory: Effort normal and breath sounds normal. No respiratory distress. He has no wheezes.  Janina Mayo is capped   GI: Soft. Bowel sounds are normal. He exhibits no distension. There is no tenderness.   Peg tube present   The peg tube site does have drainage present; there are no other signs of infection/inflammation  present.    Musculoskeletal: He exhibits no edema.  Able to move all extremities   Lymphadenopathy:    He has no cervical adenopathy.  Neurological: He is alert.  Skin: Skin is warm and dry. He is not diaphoretic.  Psychiatric: He has a normal mood and affect.     ASSESSMENT/ PLAN:  1. Dysphagia: does have peg tube; no signs of aspiration present; will continue current plan of care; does have scopolamine patch for excessive oral secretions  I have instructed the nursing staff to clean and change the dressing to his peg tube site. Will continue to monitor for signs of infection    MD is aware of resident's narcotic use and is in agreement with current plan of care. We will attempt to wean resident as  apropriate   Synthia Innocent NP [facility]  Contact [phone] Monday through Friday 8am- 5pm  After hours call [phone]

## 2016-05-21 ENCOUNTER — Ambulatory Visit (HOSPITAL_COMMUNITY)
Admission: RE | Admit: 2016-05-21 | Discharge: 2016-05-21 | Disposition: A | Discharge status: Home / Self Care | Payer: No Typology Code available for payment source | Source: Ambulatory Visit | Attending: Physician Assistant | Admitting: Physician Assistant

## 2016-05-21 ENCOUNTER — Telehealth: Payer: Self-pay | Admitting: Physician Assistant

## 2016-05-21 DIAGNOSIS — Z930 Tracheostomy status: Secondary | ICD-10-CM | POA: Diagnosis not present

## 2016-05-21 DIAGNOSIS — R1319 Other dysphagia: Secondary | ICD-10-CM | POA: Insufficient documentation

## 2016-05-21 DIAGNOSIS — K222 Esophageal obstruction: Secondary | ICD-10-CM | POA: Insufficient documentation

## 2016-05-24 NOTE — Telephone Encounter (Signed)
fyi

## 2016-05-26 ENCOUNTER — Emergency Department (HOSPITAL_COMMUNITY): Payer: Medicare Other

## 2016-05-26 ENCOUNTER — Emergency Department (HOSPITAL_COMMUNITY)
Admission: EM | Admit: 2016-05-26 | Discharge: 2016-05-26 | Disposition: A | Discharge status: Home / Self Care | Payer: Medicare Other | Attending: Emergency Medicine | Admitting: Emergency Medicine

## 2016-05-26 ENCOUNTER — Encounter (HOSPITAL_COMMUNITY): Payer: Self-pay | Admitting: Emergency Medicine

## 2016-05-26 DIAGNOSIS — J449 Chronic obstructive pulmonary disease, unspecified: Secondary | ICD-10-CM | POA: Insufficient documentation

## 2016-05-26 DIAGNOSIS — Z87891 Personal history of nicotine dependence: Secondary | ICD-10-CM | POA: Insufficient documentation

## 2016-05-26 DIAGNOSIS — I10 Essential (primary) hypertension: Secondary | ICD-10-CM | POA: Diagnosis not present

## 2016-05-26 DIAGNOSIS — K9423 Gastrostomy malfunction: Secondary | ICD-10-CM | POA: Insufficient documentation

## 2016-05-26 DIAGNOSIS — E039 Hypothyroidism, unspecified: Secondary | ICD-10-CM | POA: Insufficient documentation

## 2016-05-26 HISTORY — PX: IR GENERIC HISTORICAL: IMG1180011

## 2016-05-26 MED ORDER — LIDOCAINE VISCOUS 2 % MT SOLN
OROMUCOSAL | Status: AC
  Filled 2016-05-26: qty 15

## 2016-05-26 MED ORDER — DIATRIZOATE MEGLUMINE & SODIUM 66-10 % PO SOLN
ORAL | Status: AC
  Administered 2016-05-26: 15 mL
  Filled 2016-05-26: qty 30

## 2016-05-26 MED ORDER — IOPAMIDOL (ISOVUE-300) INJECTION 61%
INTRAVENOUS | Status: AC
  Administered 2016-05-26: 15 mL
  Filled 2016-05-26: qty 50

## 2016-05-26 NOTE — ED Provider Notes (Signed)
[facility]-EMERGENCY DEPT Provider Note   CSN: [id number] Arrival date & time: 05/26/16  0044  By signing my name below, I, Arianna Nassar, attest that this documentation has been prepared under the direction and in the presence of Shon Batonourtney F Horton, MD.  Electronically Signed: Octavia HeirArianna Nassar, ED Scribe. 05/26/16. 1:32 AM.    History   Chief Complaint Chief Complaint  Patient presents with  . Abdominal Pain    Peg tube issue    The history is provided by the patient and the EMS personnel. No language interpreter was used.   HPI Comments: Chad Huffman is a 70 y.o. male who has a PMhx of carotid artery occlusion, COPD, HTN, and IR gastromy tube in place presents to the Emergency Department complaining of intermittent, mild, RUQ abdominal pain secondary to peg tub leaking since yesterday. Pt has a peg tube in place in his RUQ. Pt says he is having increased pain when his tube is flushed and is being "pushed through". [facility] a [facility] at [facility]. Nurses note having difficulty when flushing pt's tube. Pt wears 3 L min/[place] of O2 chronically. He has no other complaints.   Past Medical History:  Diagnosis Date  . Antalgic gait   . Carotid artery occlusion   . Chronic neck and back pain   . Complication of anesthesia    pt has had cervical fusion-limited neck flextion  . COPD (chronic obstructive pulmonary disease) (HCC)   . Depression   . DJD (degenerative joint disease)    Right shoulder  . Hypertension   . Lumbago   . Peripheral neuropathy [facility](HCC)     Patient Active Problem List   Diagnosis Date Noted  . Dysphagia, pharyngoesophageal phase 05/18/2016  . GERD without esophagitis 05/09/2016  . Bilateral lower extremity edema 05/09/2016  . Slow transit constipation 05/09/2016  . Depression with anxiety 05/09/2016  . Respiratory failure with hypoxia and hypercapnia (HCC) 07/22/2015  . Cervical vertebral fusion   . Depression   . Chronic neck and back pain   . Acute  on chronic respiratory failure with hypercapnia (HCC)   . COPD exacerbation (HCC)   . HCAP (healthcare-associated pneumonia)   . Congestive dilated cardiomyopathy (HCC)   . Syncope and collapse   . Arthritis of right hip 08/04/2014  . Essential hypertension 08/04/2014  . Hypothyroidism 08/04/2014  . Syncope 08/04/2014  . Fall 08/04/2014    Past Surgical History:  Procedure Laterality Date  . ABDOMINAL AORTIC ANEURYSM REPAIR  2009   Endovascular AAA repair  . ABDOMINAL AORTIC ANEURYSM REPAIR    . CARPAL TUNNEL RELEASE     bilateral CTS  . HAMMER TOE SURGERY  12/10/2011   Procedure: HAMMER TOE CORRECTION;  Surgeon: Ernestene KielMax Hyatt, DPM;  Location: [facility];  Service: Podiatry;  Laterality: Right;  Right hammertoe repair second right with 2.5x42 orthopro screw   . IR GENERIC HISTORICAL  04/05/2016   IR GASTROSTOMY TUBE MOD SED 04/05/2016 [facility]  . IR GENERIC HISTORICAL  05/12/2016   IR CM INJ ANY COLONIC TUBE W/FLUORO 05/12/2016 Malachy MoanHeath McCullough, MD [facility]INTER[facility]  . METATARSAL OSTEOTOMY  12/10/2011   Procedure: METATARSAL OSTEOTOMY;  Surgeon: Ernestene KielMax Hyatt, DPM;  Location: [facility];  Service: Podiatry;  Laterality: Right;  Right Keller arthroplasty with size 5 silicone implant right great toe; second metatarsal osteotomy with 2.0x14 screw  . NECK SURGERY    . PERIPHERAL VASCULAR CATHETERIZATION N/A 02/11/2015   Procedure: Carotid Angiography;  Surgeon:  Nada LibmanVance W Brabham, MD;  [facility];  Service: Cardiovascular;  Laterality: N/A;  . PILONIDAL CYST / SINUS EXCISION    . Rotary cuff    . SPINE SURGERY  09/15/11   Scar tissue removed- back  . SPINE SURGERY  09/15/11   Scar tissue removed- back  . TONSILLECTOMY    . TRACHEOSTOMY TUBE PLACEMENT N/A 03/03/2016   Procedure: TRACHEOSTOMY;  Surgeon: Geanie LoganPaul Bennett, MD;  Location: [facility];  Service: ENT;  Laterality: N/A;       Home Medications    Prior to Admission medications   Medication  Sig Start Date End Date Taking? Authorizing Provider  budesonide (PULMICORT) 0.5 MG/2ML nebulizer solution Take 0.5 mg by nebulization 2 (two) times daily.    Historical Provider, MD  clonazePAM (KLONOPIN) 0.5 MG tablet Take 0.5 mg by mouth 2 (two) times daily.    Historical Provider, MD  docusate sodium (COLACE) 100 MG capsule 100 mg 2 (two) times daily.    Historical Provider, MD  enoxaparin (LOVENOX) 40 MG/0.4ML injection Inject 40 mg into the skin daily.    Historical Provider, MD  famotidine (PEPCID) 20 MG tablet Take 20 mg by mouth 2 (two) times daily.    Historical Provider, MD  folic acid (FOLVITE) 1 MG tablet Take 1 mg by mouth daily.    Historical Provider, MD  ipratropium-albuterol (DUONEB) 0.5-2.5 (3) MG/3ML SOLN Take 3 mLs by nebulization 3 (three) times daily.    Historical Provider, MD  metoCLOPramide (REGLAN) 5 MG tablet Place 2.5 mg into feeding tube every 6 (six) hours.    Historical Provider, MD  Multiple Vitamin (MULTIVITAMIN WITH MINERALS) TABS tablet Take 1 tablet by mouth daily.    Historical Provider, MD  oxyCODONE (OXY IR/ROXICODONE) 5 MG immediate release tablet Take 5 mg by mouth every 8 (eight) hours.    Historical Provider, MD  OXYGEN Inhale into the lungs. 4L/ MIN via trach PRN    Historical Provider, MD  predniSONE (DELTASONE) 5 MG tablet Take 5 mg by mouth daily.    Historical Provider, MD  QUEtiapine (SEROQUEL) 50 MG tablet Take 150 mg by mouth at bedtime.    Historical Provider, MD  sertraline (ZOLOFT) 50 MG tablet Take 75 mg by mouth daily.    Historical Provider, MD  torsemide (DEMADEX) 20 MG tablet Take 20 mg by mouth daily.    Historical Provider, MD    Family History Family History  Problem Relation Age of Onset  . Stomach cancer Mother   . Hypertension Brother   . Heart attack Father     Social History Social History  Substance Use Topics  . Smoking status: Former Smoker    Quit date: 08/30/2006  . Smokeless tobacco: Never Used  . Alcohol use 1.2  oz/week    2 Cans of beer per week     Comment: occasional use     Allergies   Ampicillin; Nortriptyline; Brimonidine; Cyclobenzaprine; Tizanidine; and Gabapentin   Review of Systems Review of Systems  Constitutional: Negative for fever.  Gastrointestinal: Positive for abdominal pain. Negative for nausea and vomiting.  All other systems reviewed and are negative.    Physical Exam Updated Vital Signs BP 113/70   Pulse 85   Temp 98.2 F (36.8 C) (Oral)   Resp 16   Ht 5\' 10"  (1.778 m)   Wt 198 lb (89.8 kg)   SpO2 100%   BMI 28.41 kg/m   Physical Exam  Constitutional: He is oriented to person,  place, and time. He appears well-developed and well-nourished.  Elderly  HENT:  Head: Normocephalic and atraumatic.  Neck:  Trach in place  Cardiovascular: Normal rate, regular rhythm and normal heart sounds.   No murmur heard. Pulmonary/Chest: Effort normal and breath sounds normal. No respiratory distress. He has no wheezes.  Abdominal: Soft. Bowel sounds are normal. There is no tenderness. There is no rebound and no guarding.  PEG tube in place, difficult to flush, area around site clean dry and intact, no obvious drainage  Musculoskeletal: He exhibits no edema.  Neurological: He is alert and oriented to person, place, and time.  Skin: Skin is warm and dry.  Psychiatric: He has a normal mood and affect.  Nursing note and vitals reviewed.    ED Treatments / Results  DIAGNOSTIC STUDIES: Oxygen Saturation is 100% on RA, normal by my interpretation.  COORDINATION OF CARE:  1:04 AM Discussed treatment plan with pt at bedside and pt agreed to plan.  Labs (all labs ordered are listed, but only abnormal results are displayed) Labs Reviewed - No data to display  EKG  EKG Interpretation None       Radiology Dg Abdomen 1 View  Result Date: 05/26/2016 CLINICAL DATA:  Gastrostomy tube not functioning.  Pain for 3 days. EXAM: ABDOMEN - 1 VIEW COMPARISON:  GI tube  injection 05/12/2016 FINDINGS: 15 cc Gastrografin injected through indwelling gastrostomy tube. No additional contrast could be injected due to resistance. Injected contrast opacifies the gastric fundus. There is contrast within gastrostomy tubing and possibly within the gastrostomy balloon. No gross evidence of extravasation. There is contrast within the ascending and proximal transverse colon from recent esophagram. Nonobstructive bowel gas pattern. Aorto bi-iliac stent graft. IMPRESSION: Contrast injected through gastrostomy tube opacifies the gastric fundus, no evidence of extravasation. Only 15 cc Gastrografin was injected due to resistance and patient discomfort. A follow-up exam could be considered to evaluate for contrast passage. Questionable contrast within the gastrostomy balloon, can be seen if contrast was instilled through the balloon port. Electronically Signed   By: Rubye Oaks M.D.   On: 05/26/2016 02:42    Procedures Procedures (including critical care time)  Medications Ordered in ED Medications  diatrizoate meglumine-sodium (GASTROGRAFIN) 66-10 % solution (15 mLs  Given 05/26/16 0200)     Initial Impression / Assessment and Plan / ED Course  I have reviewed the triage vital signs and the nursing notes.  Pertinent labs & imaging results that were available during my care of the patient were reviewed by me and considered in my medical decision making (see chart for details).  Clinical Course     Difficult to flush PEG tube. Patient reports pain with infusion. This was placed by IR in mid-October. KUB was obtained. Approximately 15 mL of contrast was infused but this was a limited bolus secondary to pain and resistance. There is contrast within the stomach but there is also possibly contrast in the bulb. Given that he continues to have resistance, feel he likely needs a formal IR study to interrogate placement and functionality. This is been ordered.  I personally performed  the services described in this documentation, which was scribed in my presence. The recorded information has been reviewed and is accurate.   Final Clinical Impressions(s) / ED Diagnoses   Final diagnoses:  PEG tube malfunction [facility])    New Prescriptions New Prescriptions   No medications on file     Shon Baton, MD 05/26/16 [phone]

## 2016-05-26 NOTE — ED Notes (Signed)
Patient transported to X-ray 

## 2016-05-26 NOTE — ED Notes (Signed)
Pt taken to IR

## 2016-05-26 NOTE — ED Notes (Signed)
Waiting for [facility] to pick pt up and return him to [facility].

## 2016-05-26 NOTE — Progress Notes (Signed)
Pt seen in IR for evaluation of G-tube. Pt c/o pain and some leakage. He reports leakage at hug and I think they are just not  Turing the valve to the correct 'off' position when not in use.  The skin site has some induration and drainage of beige purulence. It appears a residual T-Tac is trying to make it's way out and I think this is the source of the pain and induration. The T-tac was removed without difficulty. Additional injection and imaging was performed ir, which showed intra-gastric location with filling of the gastric lumen. No leakage was noted from the skin site. Bumper was secured and instructions given to pt.   Brayton ElKevin Malay Fantroy PA-C Interventional Radiology 05/26/2016 12:04 PM

## 2016-05-26 NOTE — ED Notes (Addendum)
Pt returned from IR.

## 2016-05-26 NOTE — ED Notes (Signed)
Pt called out requesting something to drink. Made him aware that I'll let the RN know.

## 2016-05-26 NOTE — ED Notes (Signed)
Called IR to check on time for pt procedure. IR should be over in the next 15-30 minutes to get pt.

## 2016-05-26 NOTE — ED Provider Notes (Signed)
IR has replaced tube. No other complaints. Discharge.   Pricilla LovelessScott Renaldo Gornick, MD 05/26/16 [phone]

## 2016-05-26 NOTE — ED Triage Notes (Addendum)
Per EMS: Pt from [facility]. Pt c/o peg tube pain and nurses are having difficulty flushing it at the facility. Pt also states PEG tube is leaking. No distention noted per EMS. Site looks fine. No pain as of right now. Pt has a tracheostomy, but it is not being used. Pt wears 3L chronically.

## 2016-05-26 NOTE — ED Notes (Signed)
Pt was discharged and transported back to [facility] via [facility].

## 2016-05-28 ENCOUNTER — Emergency Department (HOSPITAL_COMMUNITY): Payer: Medicare Other

## 2016-05-28 ENCOUNTER — Encounter (HOSPITAL_COMMUNITY): Payer: Self-pay | Admitting: *Deleted

## 2016-05-28 ENCOUNTER — Emergency Department (HOSPITAL_COMMUNITY)
Admission: EM | Admit: 2016-05-28 | Discharge: 2016-05-28 | Disposition: A | Discharge status: Home / Self Care | Payer: Medicare Other | Attending: Emergency Medicine | Admitting: Emergency Medicine

## 2016-05-28 DIAGNOSIS — E039 Hypothyroidism, unspecified: Secondary | ICD-10-CM | POA: Insufficient documentation

## 2016-05-28 DIAGNOSIS — I10 Essential (primary) hypertension: Secondary | ICD-10-CM | POA: Insufficient documentation

## 2016-05-28 DIAGNOSIS — K9423 Gastrostomy malfunction: Secondary | ICD-10-CM | POA: Diagnosis not present

## 2016-05-28 DIAGNOSIS — R633 Feeding difficulties, unspecified: Secondary | ICD-10-CM

## 2016-05-28 DIAGNOSIS — J449 Chronic obstructive pulmonary disease, unspecified: Secondary | ICD-10-CM | POA: Diagnosis not present

## 2016-05-28 DIAGNOSIS — Z87891 Personal history of nicotine dependence: Secondary | ICD-10-CM | POA: Insufficient documentation

## 2016-05-28 HISTORY — PX: IR GENERIC HISTORICAL: IMG1180011

## 2016-05-28 MED ORDER — IOPAMIDOL (ISOVUE-300) INJECTION 61%
INTRAVENOUS | Status: AC
  Administered 2016-05-28: 7 mL
  Filled 2016-05-28: qty 50

## 2016-05-28 NOTE — ED Notes (Signed)
[facility] called for transport back to [facility]

## 2016-05-28 NOTE — ED Provider Notes (Signed)
[facility]-EMERGENCY DEPT Provider Note    By signing my name below, I, Earmon Phoenix, attest that this documentation has been prepared under the direction and in the presence of Raeford Razor, MD. Electronically Signed: Earmon Phoenix, ED Scribe. 05/28/16. 2:40 PM.   History   Chief Complaint Chief Complaint  Patient presents with  . Peg tube dislodged   The history is provided by the patient and medical records. No language interpreter was used.    HPI Comments:  FARON TUDISCO is a 70 y.o. male brought in by EMS from [facility], who presents to the Emergency Department complaining of a dislodged PEG tube that was placed approximately 3 weeks ago. He states it got caught on something last night and came out about 12 hours ago. He has not taken anything for pain. He denies modifying factors. He denies fever, chills, nausea, vomiting or any other complaints.   Past Medical History:  Diagnosis Date  . Antalgic gait   . Carotid artery occlusion   . Chronic neck and back pain   . Complication of anesthesia    pt has had cervical fusion-limited neck flextion  . COPD (chronic obstructive pulmonary disease) (HCC)   . Depression   . DJD (degenerative joint disease)    Right shoulder  . Hypertension   . Lumbago   . Peripheral neuropathy [facility])     Patient Active Problem List   Diagnosis Date Noted  . Dysphagia, pharyngoesophageal phase 05/18/2016  . GERD without esophagitis 05/09/2016  . Bilateral lower extremity edema 05/09/2016  . Slow transit constipation 05/09/2016  . Depression with anxiety 05/09/2016  . Respiratory failure with hypoxia and hypercapnia (HCC) 07/22/2015  . Cervical vertebral fusion   . Depression   . Chronic neck and back pain   . Acute on chronic respiratory failure with hypercapnia (HCC)   . COPD exacerbation (HCC)   . HCAP (healthcare-associated pneumonia)   . Congestive dilated cardiomyopathy (HCC)   . Syncope and collapse   . Arthritis of right hip  08/04/2014  . Essential hypertension 08/04/2014  . Hypothyroidism 08/04/2014  . Syncope 08/04/2014  . Fall 08/04/2014    Past Surgical History:  Procedure Laterality Date  . ABDOMINAL AORTIC ANEURYSM REPAIR  2009   Endovascular AAA repair  . ABDOMINAL AORTIC ANEURYSM REPAIR    . CARPAL TUNNEL RELEASE     bilateral CTS  . HAMMER TOE SURGERY  12/10/2011   Procedure: HAMMER TOE CORRECTION;  Surgeon: Ernestene Kiel, DPM;  Location: [facility];  Service: Podiatry;  Laterality: Right;  Right hammertoe repair second right with 2.5x42 orthopro screw   . IR GENERIC HISTORICAL  04/05/2016   IR GASTROSTOMY TUBE MOD SED 04/05/2016 [facility]  . IR GENERIC HISTORICAL  05/12/2016   IR CM INJ ANY COLONIC TUBE W/FLUORO 05/12/2016 Malachy Moan, MD [facility]INTER[facility]  . IR GENERIC HISTORICAL  05/26/2016   IR CM INJ ANY COLONIC TUBE W/FLUORO 05/26/2016 [facility]  . METATARSAL OSTEOTOMY  12/10/2011   Procedure: METATARSAL OSTEOTOMY;  Surgeon: Ernestene Kiel, DPM;  Location: [facility];  Service: Podiatry;  Laterality: Right;  Right Keller arthroplasty with size 5 silicone implant right great toe; second metatarsal osteotomy with 2.0x14 screw  . NECK SURGERY    . PERIPHERAL VASCULAR CATHETERIZATION N/A 02/11/2015   Procedure: Carotid Angiography;  Surgeon: Nada Libman, MD;  Location: [facility] INVASIVE CV LAB;  Service: Cardiovascular;  Laterality: N/A;  . PILONIDAL CYST / SINUS EXCISION    . Rotary  cuff    . SPINE SURGERY  09/15/11   Scar tissue removed- back  . SPINE SURGERY  09/15/11   Scar tissue removed- back  . TONSILLECTOMY    . TRACHEOSTOMY TUBE PLACEMENT N/A 03/03/2016   Procedure: TRACHEOSTOMY;  Surgeon: Geanie LoganPaul Bennett, MD;  Location: [facility] ORS;  Service: ENT;  Laterality: N/A;       Home Medications    Prior to Admission medications   Medication Sig Start Date End Date Taking? Authorizing Provider  budesonide (PULMICORT) 0.5 MG/2ML nebulizer solution Take 0.5 mg by  nebulization 2 (two) times daily.   Yes Historical Provider, MD  clonazePAM (KLONOPIN) 0.5 MG tablet Place 0.5 mg into feeding tube 2 (two) times daily.    Yes Historical Provider, MD  docusate sodium (COLACE) 100 MG capsule Take 100 mg by mouth 2 (two) times daily.    Yes Historical Provider, MD  enoxaparin (LOVENOX) 40 MG/0.4ML injection Inject 40 mg into the skin daily.   Yes Historical Provider, MD  famotidine (PEPCID) 20 MG tablet Place 20 mg into feeding tube 2 (two) times daily.    Yes Historical Provider, MD  folic acid (FOLVITE) 1 MG tablet Place 1 mg into feeding tube daily.    Yes Historical Provider, MD  ipratropium-albuterol (DUONEB) 0.5-2.5 (3) MG/3ML SOLN Take 3 mLs by nebulization 3 (three) times daily.   Yes Historical Provider, MD  metoCLOPramide (REGLAN) 5 MG tablet Place 2.5 mg into feeding tube every 6 (six) hours.   Yes Historical Provider, MD  Multiple Vitamin (MULTIVITAMIN WITH MINERALS) TABS tablet Take 1 tablet by mouth daily.   Yes Historical Provider, MD  Nutritional Supplements (FEEDING SUPPLEMENT, GLUCERNA 1.5 CAL,) LIQD Place 60 mL/hr into feeding tube continuous.   Yes Historical Provider, MD  Nutritional Supplements (NUTRITIONAL DRINK) LIQD Take 1 Bottle by mouth 2 (two) times daily. For Prophylaxis   Yes Historical Provider, MD  oxyCODONE (OXY IR/ROXICODONE) 5 MG immediate release tablet Place 5 mg into feeding tube every 8 (eight) hours.    Yes Historical Provider, MD  OXYGEN 4 L/min by Intratracheal route as needed (for shortness of breath).    Yes Historical Provider, MD  predniSONE (DELTASONE) 5 MG tablet Place 5 mg into feeding tube daily.    Yes Historical Provider, MD  QUEtiapine (SEROQUEL) 50 MG tablet Place 150 mg into feeding tube at bedtime.    Yes Historical Provider, MD  sertraline (ZOLOFT) 50 MG tablet Place 75 mg into feeding tube daily.    Yes Historical Provider, MD  torsemide (DEMADEX) 20 MG tablet Place 20 mg into feeding tube daily.    Yes  Historical Provider, MD    Family History Family History  Problem Relation Age of Onset  . Stomach cancer Mother   . Hypertension Brother   . Heart attack Father     Social History Social History  Substance Use Topics  . Smoking status: Former Smoker    Quit date: 08/30/2006  . Smokeless tobacco: Never Used  . Alcohol use 1.2 oz/week    2 Cans of beer per week     Comment: occasional use     Allergies   Ampicillin; Nortriptyline; Brimonidine; Cyclobenzaprine; Tizanidine; and Gabapentin   Review of Systems Review of Systems A complete 10 system review of systems was obtained and all systems are negative except as noted in the HPI and PMH.    Physical Exam Updated Vital Signs BP 153/85 (BP Location: Left Arm)   Pulse 83   Temp 98.5  F (36.9 C) (Oral)   SpO2 98%   Physical Exam  Constitutional: He is oriented to person, place, and time. He appears well-developed and well-nourished.  HENT:  Head: Normocephalic.  Eyes: EOM are normal.  Neck: Normal range of motion.  Cardiovascular: Normal rate, regular rhythm and normal heart sounds.  Exam reveals no gallop and no friction rub.   No murmur heard. Pulmonary/Chest: Effort normal and breath sounds normal. No respiratory distress. He has no wheezes. He has no rales.  Abdominal: Soft. He exhibits no distension. There is no tenderness.  Gastric tube stoma appears healthy. Minimal bleeding.  Musculoskeletal: Normal range of motion.  Neurological: He is alert and oriented to person, place, and time.  Psychiatric: He has a normal mood and affect.  Nursing note and vitals reviewed.    ED Treatments / Results  DIAGNOSTIC STUDIES: Oxygen Saturation is 98% on RA, normal by my interpretation.   COORDINATION OF CARE: 12:39 PM- Will try to replace tube. Pt verbalizes understanding and agrees to plan.  Medications  iopamidol (ISOVUE-300) 61 % injection (7 mLs  Contrast Given 05/28/16 1407)    Labs (all labs ordered are  listed, but only abnormal results are displayed) Labs Reviewed - No data to display  EKG  EKG Interpretation None       Radiology No results found.  Procedures Procedures (including critical care time)  Medications Ordered in ED Medications  iopamidol (ISOVUE-300) 61 % injection (7 mLs  Contrast Given 05/28/16 1407)     Initial Impression / Assessment and Plan / ED Course  I have reviewed the triage vital signs and the nursing notes.  Pertinent labs & imaging results that were available during my care of the patient were reviewed by me and considered in my medical decision making (see chart for details).  Clinical Course     70 year old male with displaced PEG tube. Just recently placed.  IR for replacement. No other acute complaints.  Final Clinical Impressions(s) / ED Diagnoses   Final diagnoses:  PEG tube malfunction [facility](HCC)    New Prescriptions New Prescriptions   No medications on file     Raeford RazorStephen Mekisha Bittel, MD 06/01/16 1944

## 2016-05-28 NOTE — ED Notes (Signed)
Bed: [id number] Expected date:  Expected time:  Means of arrival:  Comments: 70 yo M pulled out g-tube

## 2016-05-28 NOTE — ED Triage Notes (Signed)
Per EMS, pt from [facility] here due to g-tube accidentally becoming dislodged last night. Pt states the g-tube came out when he rolled over in bed. Pt states he had pain around his g-tube last night but states the pain resolved after the g-tube came out.

## 2016-05-28 NOTE — ED Notes (Signed)
Bed: WHALA Expected date:  Expected time:  Means of arrival:  Comments: 

## 2016-05-28 NOTE — ED Notes (Signed)
Off floor for procedure.

## 2016-05-30 DIAGNOSIS — Z931 Gastrostomy status: Secondary | ICD-10-CM | POA: Insufficient documentation

## 2016-06-02 ENCOUNTER — Other Ambulatory Visit (HOSPITAL_COMMUNITY): Payer: Self-pay | Admitting: Internal Medicine

## 2016-06-02 DIAGNOSIS — R1319 Other dysphagia: Secondary | ICD-10-CM

## 2016-06-04 ENCOUNTER — Ambulatory Visit (HOSPITAL_COMMUNITY)
Admission: RE | Admit: 2016-06-04 | Discharge: 2016-06-04 | Disposition: A | Discharge status: Home / Self Care | Payer: No Typology Code available for payment source | Source: Ambulatory Visit | Attending: Internal Medicine | Admitting: Internal Medicine

## 2016-06-04 DIAGNOSIS — R1310 Dysphagia, unspecified: Secondary | ICD-10-CM | POA: Diagnosis present

## 2016-06-04 DIAGNOSIS — R1319 Other dysphagia: Secondary | ICD-10-CM

## 2016-06-04 NOTE — Progress Notes (Signed)
Modified Barium Swallow Progress Note  Patient Details  Name: Chad Huffman MRN: [id number] Date of Birth: 05-Oct-1945  Today's Date: 06/04/2016  Modified Barium Swallow completed.  Full report located under Chart Review in the Imaging Section.  Brief recommendations include the following:  Clinical Impression  Pt has a mild pharyngeal dysphagia that is suspected to be related to esophgeal component as well. He does have a very mild pharyngeal delay that causes intermittent flash penetration, although this is not uncommon given his age. He does have mild residuals in his pyrifrom sinuses with thin liquids in the absence of overt weakness, which may be due to increased sub-UES pressure. Pt did have belching after intake and has a h/o of GERD, although an esophageal scan did not show barium stasis. He does clear the pharyngeal residue with spontaneous second swallows. Given the above and pt's adequate airway protection, recommend to return to a regular diet and thin liquids with use of general aspiration precautions.    Swallow Evaluation Recommendations       SLP Diet Recommendations: Regular solids;Thin liquid   Liquid Administration via: Cup;Straw   Medication Administration: Whole meds with liquid   Supervision: Patient able to self feed   Compensations: Slow rate;Small sips/bites;Follow solids with liquid   Postural Changes: Remain semi-upright after after feeds/meals (Comment);Seated upright at 90 degrees   Oral Care Recommendations: Oral care BID        [name], [name] 06/04/2016,12:03 PM   [name], M.A. CCC-SLP [phone]

## 2016-06-09 ENCOUNTER — Non-Acute Institutional Stay (SKILLED_NURSING_FACILITY): Payer: Medicare Other | Admitting: Internal Medicine

## 2016-06-09 DIAGNOSIS — J9601 Acute respiratory failure with hypoxia: Secondary | ICD-10-CM

## 2016-06-09 DIAGNOSIS — F329 Major depressive disorder, single episode, unspecified: Secondary | ICD-10-CM

## 2016-06-09 DIAGNOSIS — R1314 Dysphagia, pharyngoesophageal phase: Secondary | ICD-10-CM

## 2016-06-09 DIAGNOSIS — M5489 Other dorsalgia: Secondary | ICD-10-CM

## 2016-06-09 DIAGNOSIS — J9602 Acute respiratory failure with hypercapnia: Secondary | ICD-10-CM

## 2016-06-09 DIAGNOSIS — F32A Depression, unspecified: Secondary | ICD-10-CM

## 2016-06-09 NOTE — Progress Notes (Signed)
This is a routine visit.  Level care skilled.  Facility is [facility] of chronic medical issues including chronic respiratory failure with COPD-dysphagia with history of PEG tube-GERD-depression-lower extremity edema-constipation.  Also acute visit secondary to complaints of lower back pain.  History of present illness.  Patient is a 70 year old male with the above diagnoses he continues to be quite stable he is complaining of back pain today denies any recent trauma or falls.  He does have a history of PEG tube with chronic dysphasia but apparently is taking by mouth  fairly well at this point with the PEG tube for supplementation apparently  Nursing staff does not report any recent acute issues.  Although he does report feeling somewhat more depressed he does have history of depression within the side E is on Zoloft 2075 mg a days also on Klonopin 0.5 mg twice a day as well as Seroquel 150 mg daily at bedtime.       Past Medical History:  Diagnosis Date  . Antalgic gait   . Carotid artery occlusion   . Chronic neck and back pain   . Complication of anesthesia    pt has had cervical fusion-limited neck flextion  . COPD (chronic obstructive pulmonary disease) (HCC)   . Depression   . DJD (degenerative joint disease)    Right shoulder  . Hypertension   . Lumbago   . Peripheral neuropathy [facility])          Past Surgical History:  Procedure Laterality Date  . ABDOMINAL AORTIC ANEURYSM REPAIR  2009   Endovascular AAA repair  . ABDOMINAL AORTIC ANEURYSM REPAIR    . CARPAL TUNNEL RELEASE     bilateral CTS  . HAMMER TOE SURGERY  12/10/2011   Procedure: HAMMER TOE CORRECTION;  Surgeon: Ernestene Kiel, DPM;  Location: [facility];  Service: Podiatry;  Laterality: Right;  Right hammertoe repair second right with 2.5x42 orthopro screw  . IR GENERIC HISTORICAL  04/05/2016   IR GASTROSTOMY TUBE MOD  SED 04/05/2016 [facility]INTER[facility] RAD  . METATARSAL OSTEOTOMY  12/10/2011   Procedure: METATARSAL OSTEOTOMY;  Surgeon: Ernestene Kiel, DPM;  Location: [facility];  Service: Podiatry;  Laterality: Right;  Right Keller arthroplasty with size 5 silicone implant right great toe; second metatarsal osteotomy with 2.0x14 screw  . NECK SURGERY    . PERIPHERAL VASCULAR CATHETERIZATION N/A 02/11/2015   Procedure: Carotid Angiography;  Surgeon: Nada Libman, MD;  Location: [facility] INVASIVE CV LAB;  Service: Cardiovascular;  Laterality: N/A;  . PILONIDAL CYST / SINUS EXCISION    . Rotary cuff    . SPINE SURGERY  09/15/11   Scar tissue removed- back  . SPINE SURGERY  09/15/11   Scar tissue removed- back  . TONSILLECTOMY    . TRACHEOSTOMY TUBE PLACEMENT N/A 03/03/2016   Procedure: TRACHEOSTOMY;  Surgeon: Geanie Logan, MD;  Location: [facility];  Service: ENT;  Laterality: N/A;    Social History        Social History  . Marital status: Single    Spouse name: N/A  . Number of children: N/A  . Years of education: N/A      Occupational History  . Not on file.         Social History Main Topics  . Smoking status: Former Smoker    Quit date: 08/30/2006  . Smokeless tobacco: Never Used  . Alcohol use 1.2 oz/week    2 Cans of beer per  week     Comment: occasional use  . Drug use: No  . Sexual activity: Not on file       Other Topics Concern  . Not on file      Social History Narrative  . No narrative on file         Family History  Problem Relation Age of Onset  . Cancer Mother     Stomach  . Hypertension Brother   . Heart attack Father       VITAL SIGNS  Temperature 98.5 pulse 87 respirations 18 blood pressure 132/76 weight is stable at 198        Patient's Medications  New Prescriptions   No medications on file  Previous Medications   BUDESONIDE (PULMICORT) 0.5 MG/2ML NEBULIZER SOLUTION    Take 0.5 mg by nebulization 2 (two) times  daily.   CLONAZEPAM (KLONOPIN) 0.5 MG TABLET    Take 0.5 mg by mouth 2 (two) times daily.   DOCUSATE SODIUM (COLACE) 100 MG CAPSULE    100 mg 2 (two) times daily.   ENOXAPARIN (LOVENOX) 40 MG/0.4ML INJECTION    Inject 40 mg into the skin daily.   FAMOTIDINE (PEPCID AC) 10 MG CHEWABLE TABLET    Place 20 mg into feeding tube 2 (two) times daily.   FOLIC ACID (FOLVITE) 1 MG TABLET    Take 1 mg by mouth daily.   IPRATROPIUM-ALBUTEROL (DUONEB) 0.5-2.5 (3) MG/3ML SOLN    Take 3 mLs by nebulization 3 (three) times daily.   METOCLOPRAMIDE HCL (REGLAN PO)    2.5 mg by Percutaneous route every 6 (six) hours.   MULTIPLE VITAMIN (MULTIVITAMIN WITH MINERALS) TABS TABLET    Take 1 tablet by mouth daily.   OXYCODONE (OXY IR/ROXICODONE) 5 MG IMMEDIATE RELEASE TABLET    Take 5 mg by mouth every 8 (eight) hours.   OXYGEN    Inhale into the lungs. 4L/ MIN via trach PRN   PREDNISONE (DELTASONE) 5 MG TABLET    Place 5 mg into feeding tube daily.   QUETIAPINE (SEROQUEL) 50 MG TABLET    Take 150 mg by mouth at bedtime.   SERTRALINE HCL (ZOLOFT PO)    Place 75 mg into feeding tube daily.   TORSEMIDE (DEMADEX) 20 MG TABLET    Take 20 mg by mouth daily.  Modified Medications   No medications on file  Discontinued Medications   No medications on file     SIGNIFICANT DIAGNOSTIC EXAMS   04-06-16: chest x-ray: Shallow inspiration with bibasilar atelectasis versus infiltrate. Small right pleural effusion.    LABS REVIEWED:   03-10-16: tsh 6.972; hgb a1c 5.4 04-11-16: wbc 16.5; hgb 11.4; hct 40.8; mcv 104.1; plt 141; glucose 166; bun 65; creat 1.14; k+ 4.3; na++ 152 04-13-16: wbc 19.6; hgb 10.5; hct 34.9; mcv 102.5; plt 107; glucose 109; bun 52.1; creat 0.97; k+ 4.1; na++ 147  04-28-16: wbc 14.8; hgb 10.0; hct 31.6; mcv 97.4; plt 140; glucose 128; bun 27.0; creat 0.71; k+ 4.2; na++ 133; liver normal albumin 3.3    Review of Systems  Constitutional: Negative for malaise/fatigue.    Respiratory: Negative for cough and shortness of breath.   Cardiovascular: Negative for chest pain, palpitations and leg swelling.  Gastrointestinal: negative for nausea vomiting and abdominal pain  Musculoskeletal:Positive for back pain, Skin: Negative.   Neurological: Negative for dizziness. Or numbness Psychiatric/Behavioral: The patient is not nervous/anxious. Feels he does have some more episodes of a flattened mood moderately increased depression  Physical Exam  Constitutional: No distress lying comfortably in bed.  Eyes: Conjunctivae are normal.  Neck: Neck supple. No JVD present. No thyromegaly present.  Cardiovascular: Normal rate, regular rhythm and intact distal pulses.   Respiratory: Effort normal and breath sounds normal. No respiratory distress. He has no wheezes.  Janina Mayorach is capped   GI: Soft. Bowel sounds are normal. He exhibits no distension. There is no tenderness.   Peg tube present   with some brownish drainage there is no surrounding erythema firmness or tenderness  Musculoskeletal: He exhibits mild edema.  Able to move all extremities He is able to stand without assistance there are surgical scars cervical and lumbar back area-there is some mild tenderness to palpation of the lumbar area I do not note any deformities   Lymphadenopathy:    He has no cervical adenopathy.  Neurological: He is alert.  Skin: Skin is warm and dry. He is not diaphoretic.  Psychiatric: He has a normal mood and affect.   ASSESSMENT/ PLAN:  1. Chronic respiratory failure with COPD: will continue pulmicort neb twice daily; duoneb every 6 hours; is on prednisone this appears stable  2. Dysphagia: does have peg tube; no signs of aspiration present; will continue current plan of care; there is some brownish drainage apparently this is somewhat chronic if possible attempt to culture although apparently this comes and goes  3. gerd: will continue pepcid 20 mg twice daily will continue  reglan 2.5 mg every 6 hours for gastric emptying   4. Depression with anxiety: will continue klonopin 0.5 mg twice daily and zoloft 75 mg daily and will continue seroquel xr 150 mg daily-will order a psych consult since he feels he is having some slightly more flat moods than previously  5. Bilateral lower extremity edema: will continue demadex 20 mg daily this appears to be stable  6. Constipation: will continue colace twice daily   #7 low back pain will order an x-ray of the lumbar spine-as well as thoracic and cervical spine-he does have oxycodone as needed for pain 5 mg every 8 hours.  Number 8-- mild hyponatremia with sodium of 133 on lab done 04/28/2016 will update a BMP.  #9-history of leukocytosis this appears to be trending down with the white count of 14,800 on lab done in November-she is on prednisone-will update a CBC is not complaining of any fever chills increased cough congestion or dysuria.  #10 high-risk med he is on Seroquel--as well as prednisone-- will update a hemoglobin A1c   [id number]-of note greater than 35 minutes spent assessing patient-reviewing his chart-reviewing his labs-discussing his status with nursing staff-discussing his concerns at bedside-and coordinating and formulating a plan of care for numerous diagnoses-of note greater than 50% of time spent coordinating a plan a with patient input

## 2016-06-10 LAB — BASIC METABOLIC PANEL
BUN: 14 mg/dL (ref 4–21)
Creatinine: 0.9 mg/dL (ref <=1.3)
GLUCOSE: 87 mg/dL
Potassium: 3.8 mmol/L (ref 3.4–5.3)
Sodium: 139 mmol/L (ref 137–147)

## 2016-06-10 LAB — CBC AND DIFFERENTIAL
HEMATOCRIT: 37 % — AB (ref 41–53)
HEMOGLOBIN: 11.2 g/dL — AB (ref 13.5–17.5)
Platelets: 135 10*3/uL — AB (ref 150–399)
WBC: 7.3 10*3/mL

## 2016-06-10 LAB — HEMOGLOBIN A1C: Hemoglobin A1C: 5.6

## 2016-06-18 ENCOUNTER — Other Ambulatory Visit: Payer: Self-pay | Admitting: *Deleted

## 2016-06-23 IMAGING — CR DG ABDOMEN 1V
1 series · 2 of 2 positions shown · non-contrast
Comparison: None.

CLINICAL DATA: Numbness and tingling in both legs for 1 week with
"fluid build up".

EXAM:
ABDOMEN - 1 VIEW

[Series 1: dg abd 1 view · 0.14mm/px · 2 of 2 slices shown]
[im 1/2]
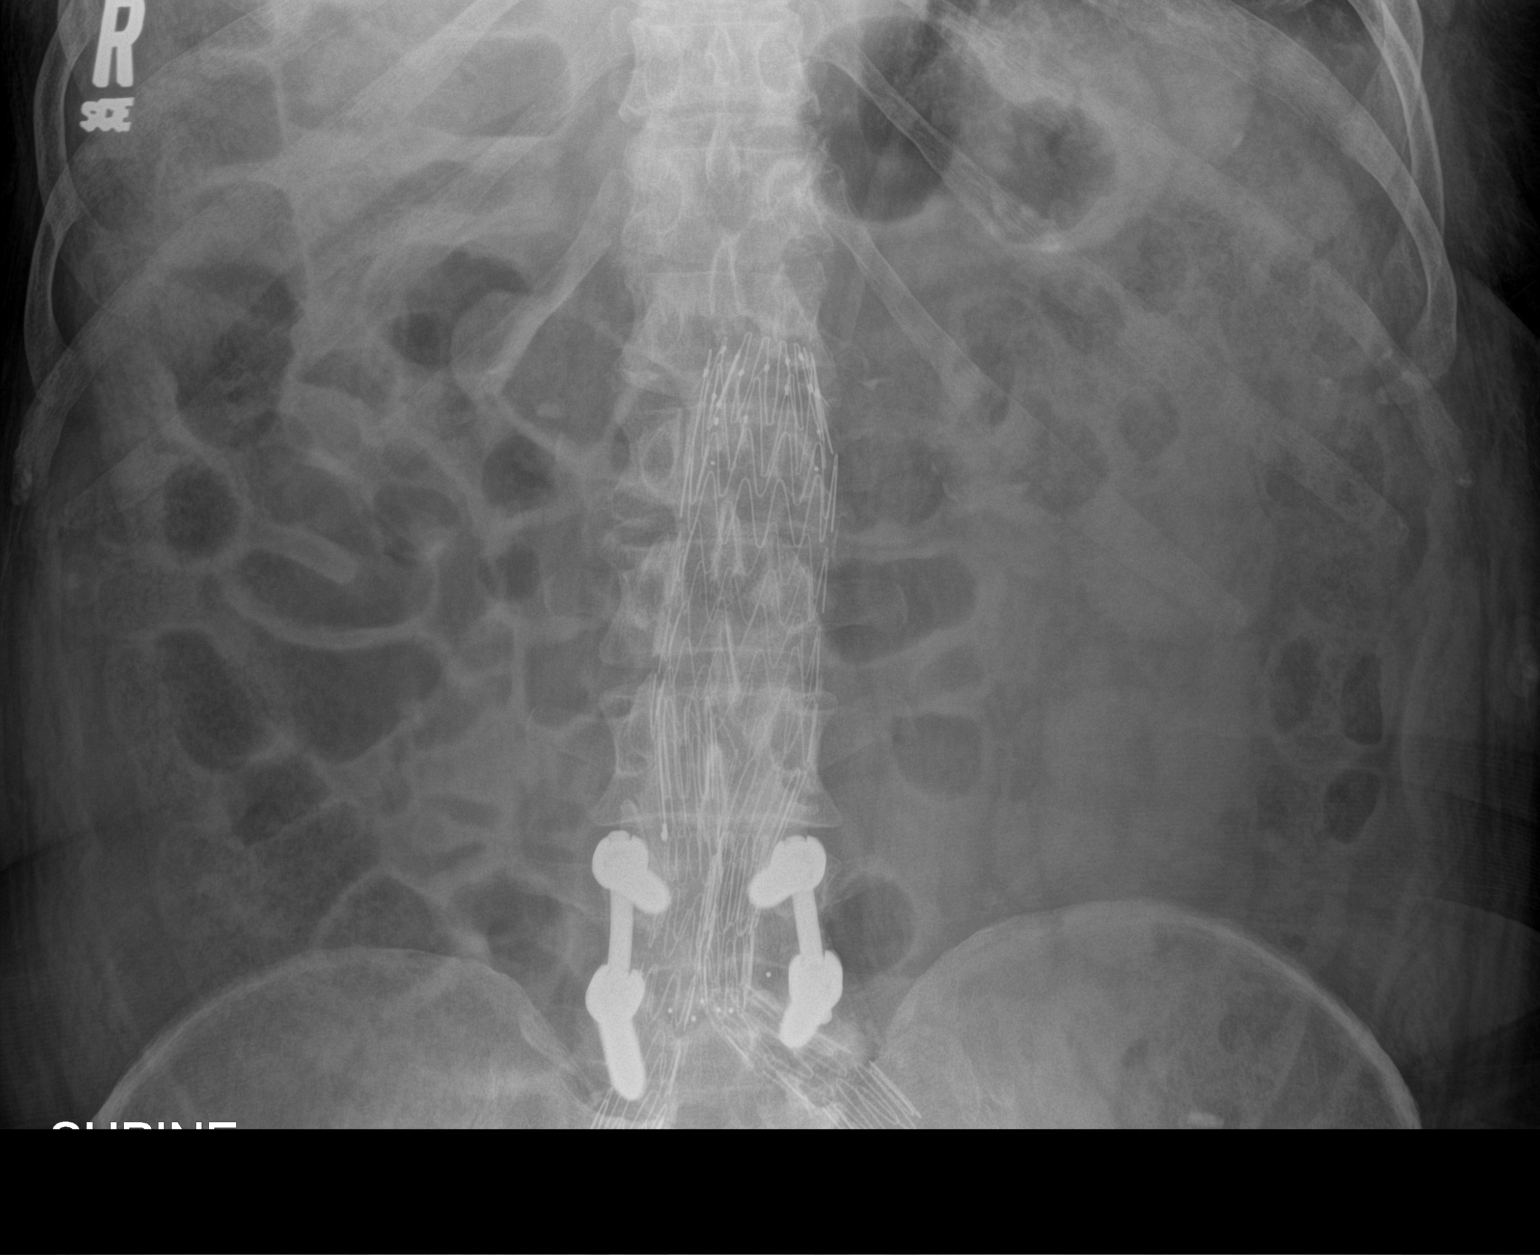
[im 2/2]
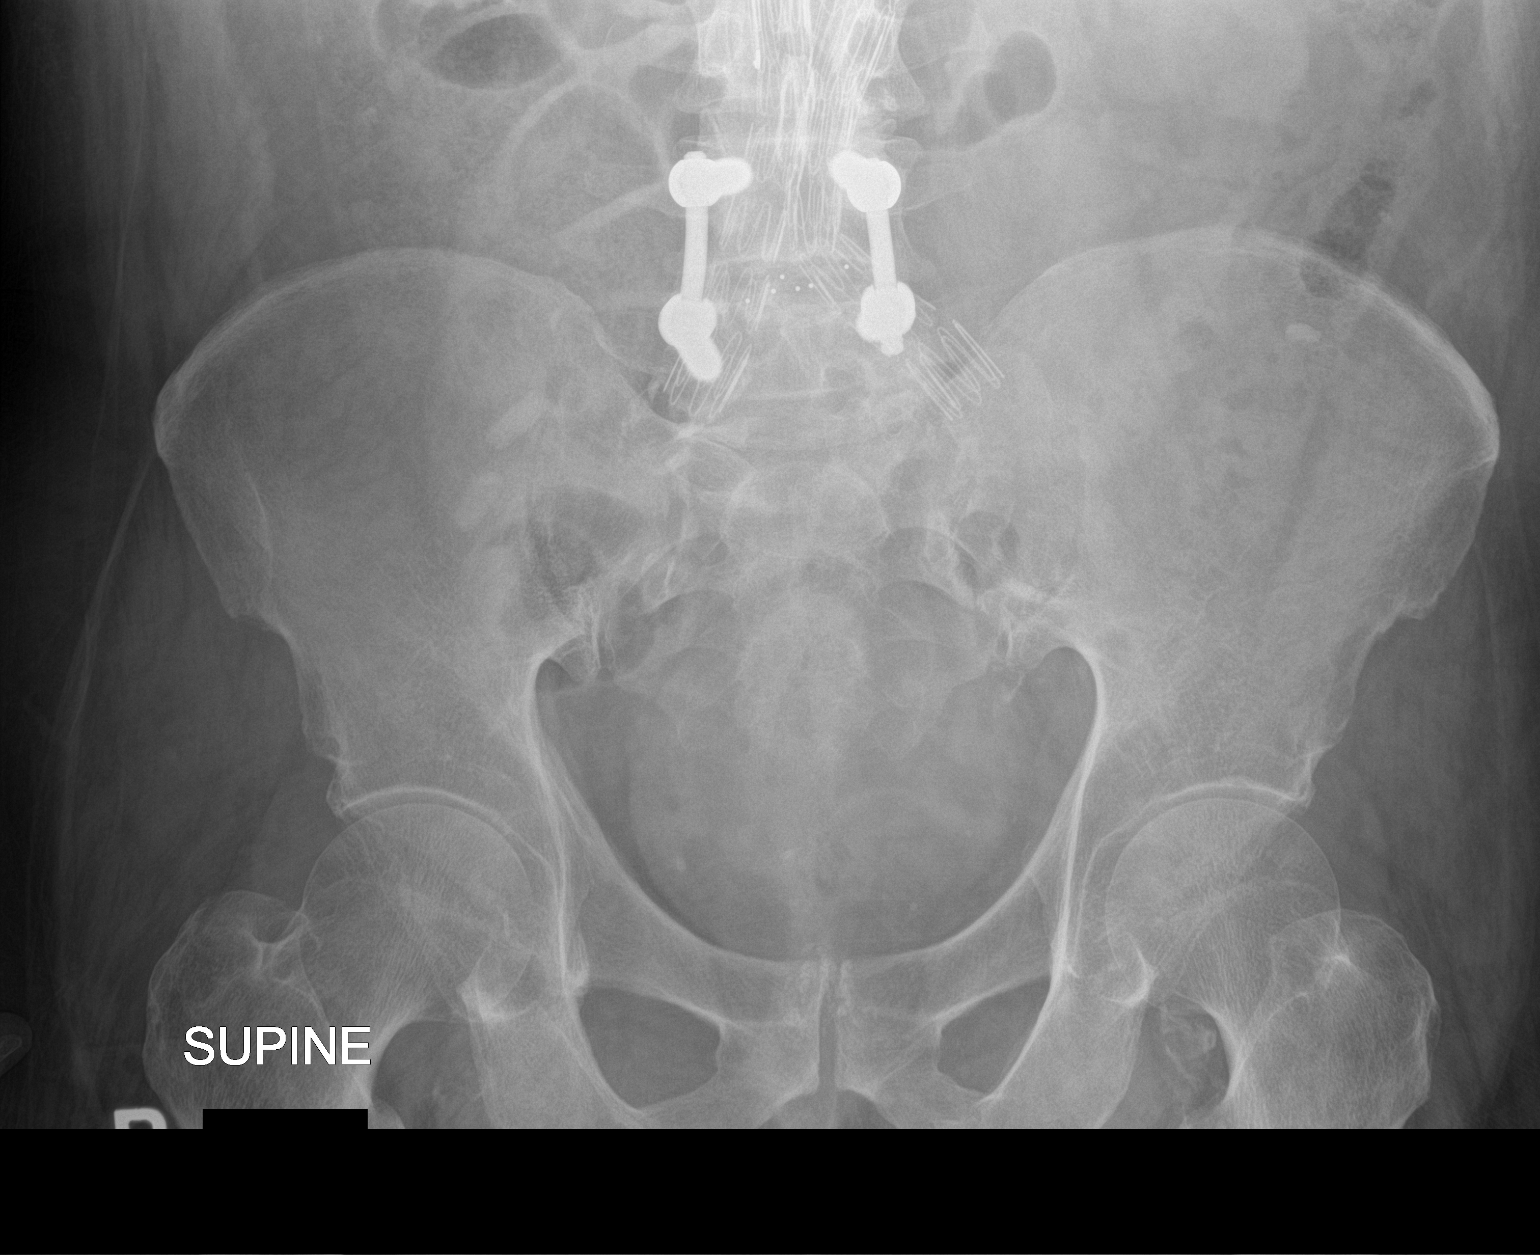

[2 of 2 positions shown; findings below may reference images not displayed]

FINDINGS: The bowel gas pattern is nonobstructive. No unexpected abdominal
calcification or focal bony abnormality is identified. Aortoiliac
stent graft is identified. The patient is status post lower lumbar
fusion.
IMPRESSION: No acute abnormality.

## 2016-06-23 IMAGING — CR DG CHEST 1V
1 series · 1 of 1 positions shown · non-contrast
Comparison: Radiographs 08/23/2014 and 08/18/2014.

CLINICAL DATA: Shortness of breath. History of COPD, hypertension
and depression. Initial encounter.

EXAM:
CHEST  1 VIEW

[ap]
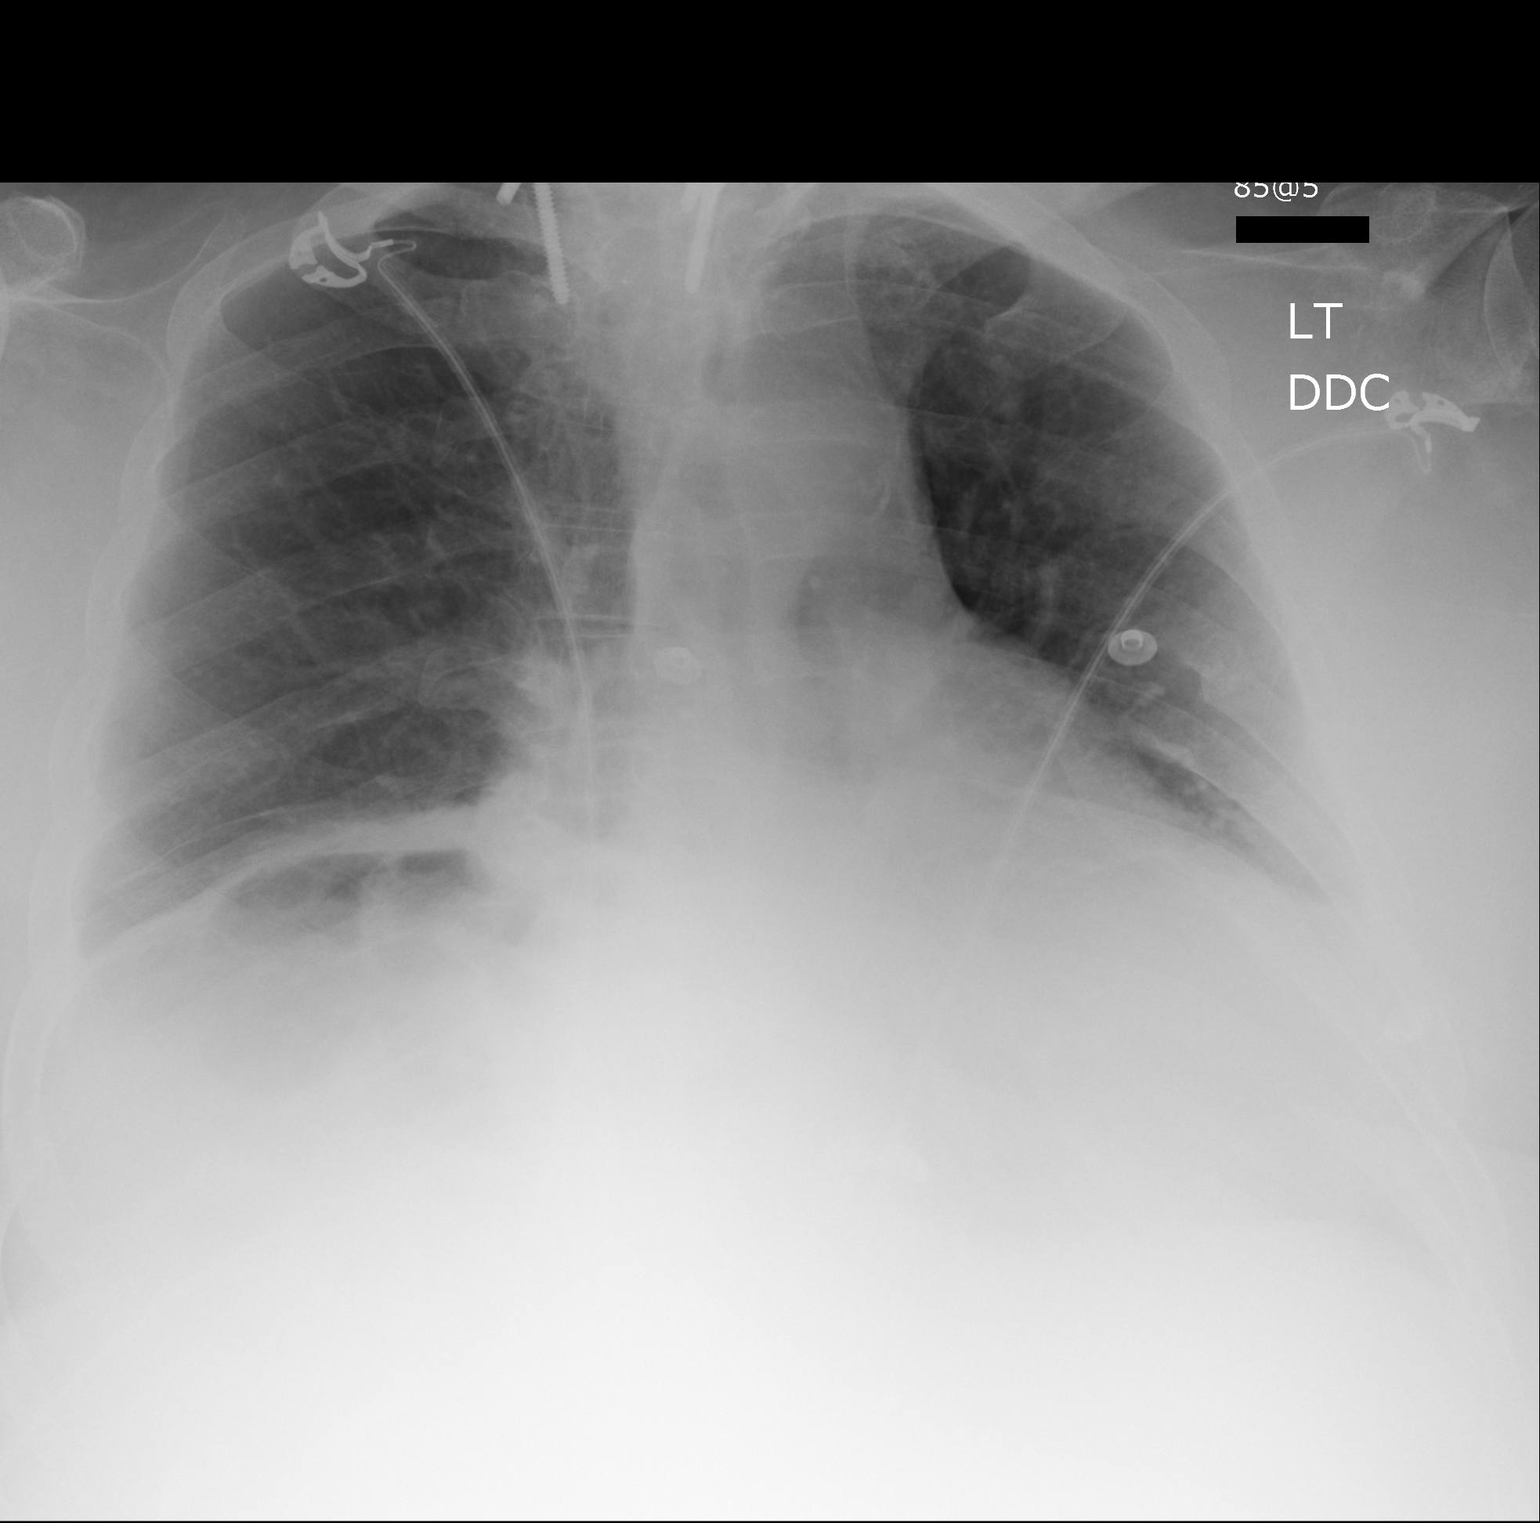

[1 of 1 positions shown; findings below may reference images not displayed]

FINDINGS: 9171 hours. The patient is mildly rotated to the left and there are
persistent low lung volumes. The heart size and mediastinal contours
are stable. Bibasilar atelectasis is similar to the prior study.
There is no confluent airspace opacity, edema or significant pleural
effusion. Old rib fracture noted on the left. Patient is status post
cervical thoracic fusion.
IMPRESSION: Similar appearance of chronic bibasilar atelectasis. No definite
acute findings.

## 2016-06-26 IMAGING — MR MR CERVICAL SPINE W/O CM
5 series · 42 of 48 positions shown · non-contrast
Comparison: Cervical spine CT 05/28/2014

CLINICAL DATA: Pain down the neck to shoulders with tingling in the
arms and legs. Symptoms for 3 months. No indication of trauma.

EXAM:
MRI CERVICAL SPINE WITHOUT CONTRAST
TECHNIQUE: Multiplanar, multisequence MR imaging of the cervical spine was
performed. No intravenous contrast was administered.

[Series 3: T2 · sagittal · 3.0mm · 0.70mm/px · 6 of 15 slices shown (1 of 2)]
[im 1/15]
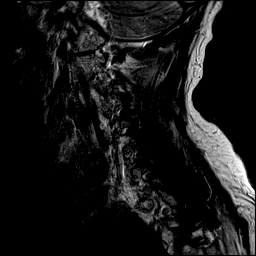
[im 3/15]
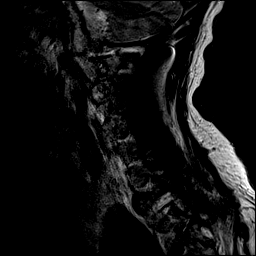
[im 6/15]
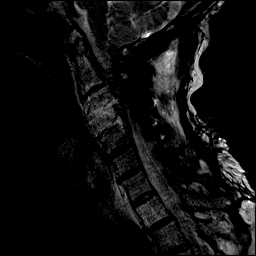
[im 9/15]
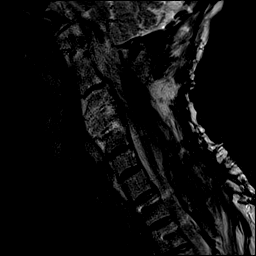
[im 12/15]
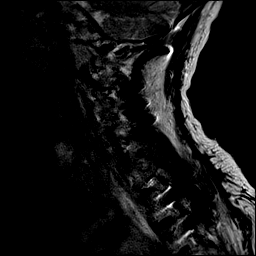
[im 15/15]
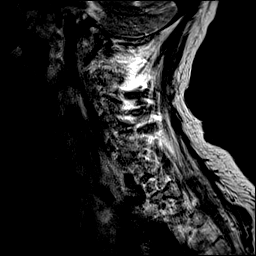

[Series 4: T1 · sagittal · 3.0mm · 0.70mm/px · 7 of 15 slices shown]
[im 1/15]
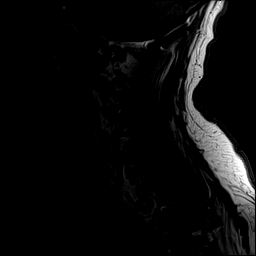
[im 3/15]
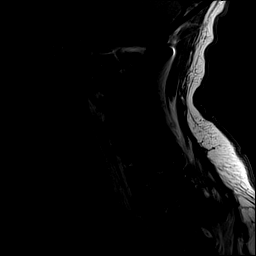
[im 5/15]
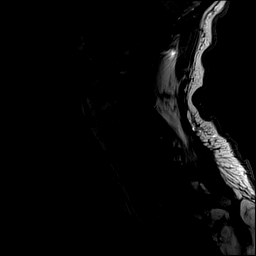
[im 8/15]
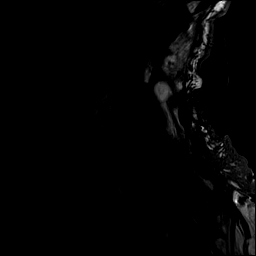
[im 10/15]
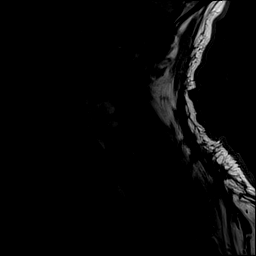
[im 12/15]
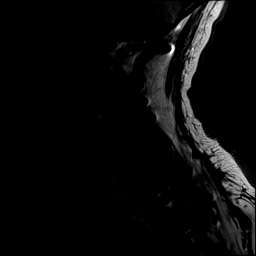
[im 15/15]
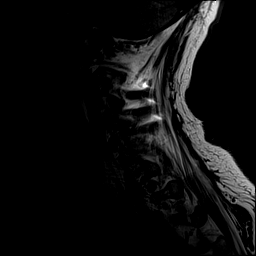

[Series 5: STIR · sagittal · 3.0mm · 0.70mm/px · 7 of 15 slices shown]
[im 1/15]
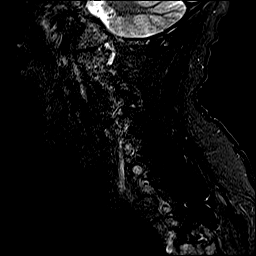
[im 3/15]
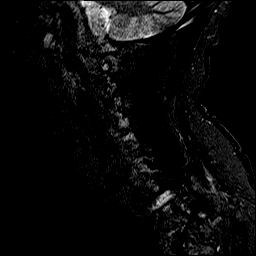
[im 5/15]
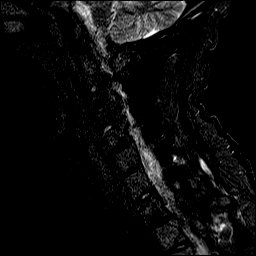
[im 8/15]
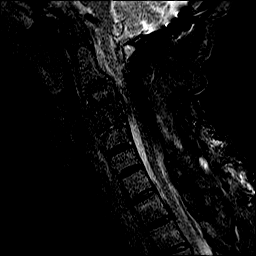
[im 10/15]
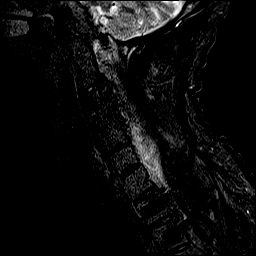
[im 12/15]
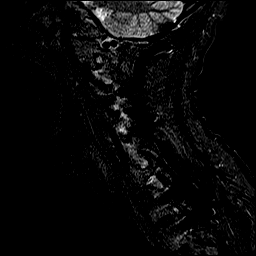
[im 15/15]
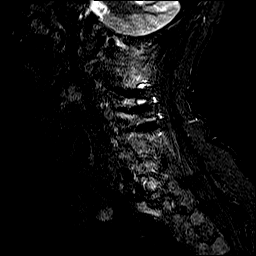

[Series 6: T2 · axial · 3.0mm · 0.70mm/px · z∈[-52,+55]mm · 14 of 31 slices shown (2 of 2)]
[im 1/31]
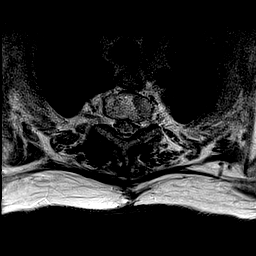
[im 3/31]
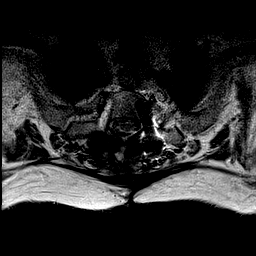
[im 5/31]
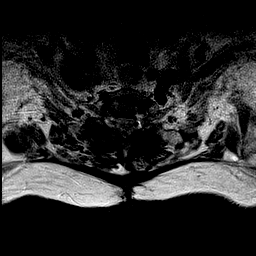
[im 7/31]
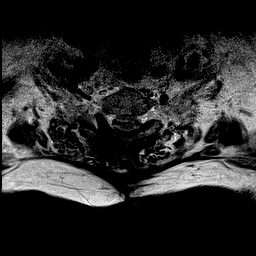
[im 10/31]
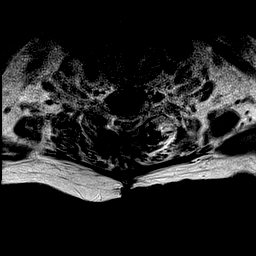
[im 12/31]
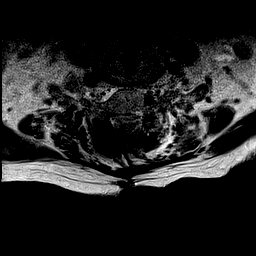
[im 14/31]
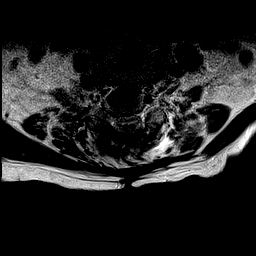
[im 17/31]
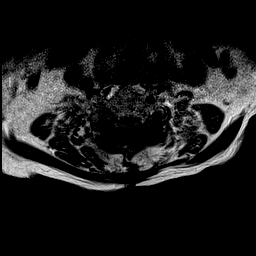
[im 19/31]
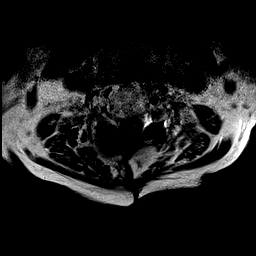
[im 21/31]
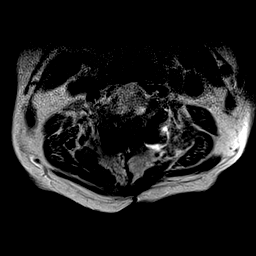
[im 24/31]
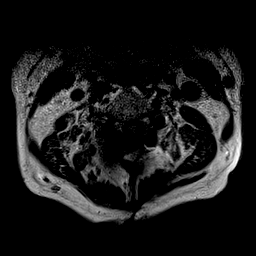
[im 26/31]
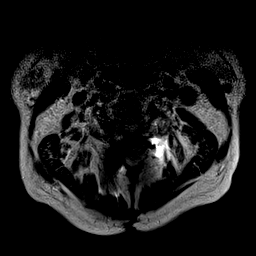
[im 28/31]
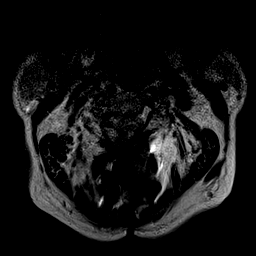
[im 31/31]
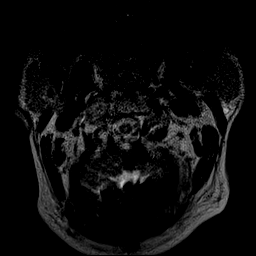

[Series 7: mpgr ax · axial · 3.0mm · 0.35mm/px · z∈[-52,+55]mm · 8 of 31 slices shown]
[im 1/31]
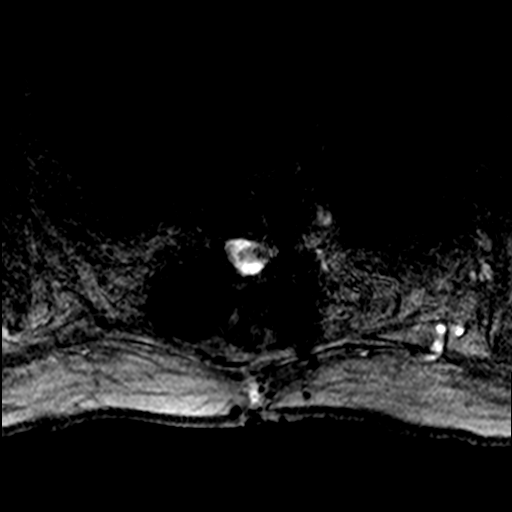
[im 5/31]
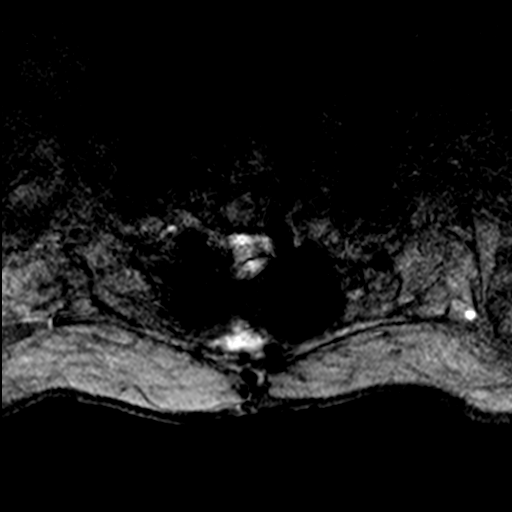
[im 10/31]
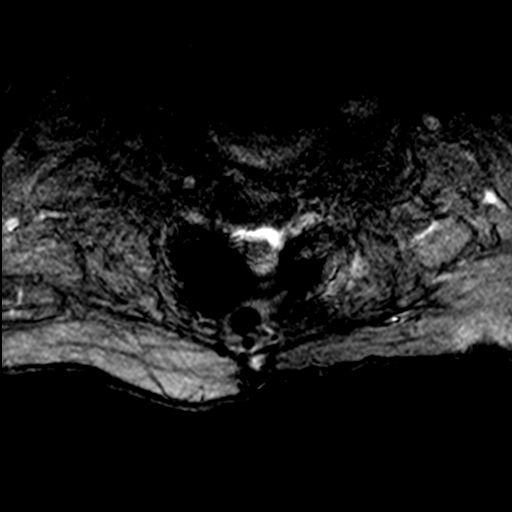
[im 14/31]
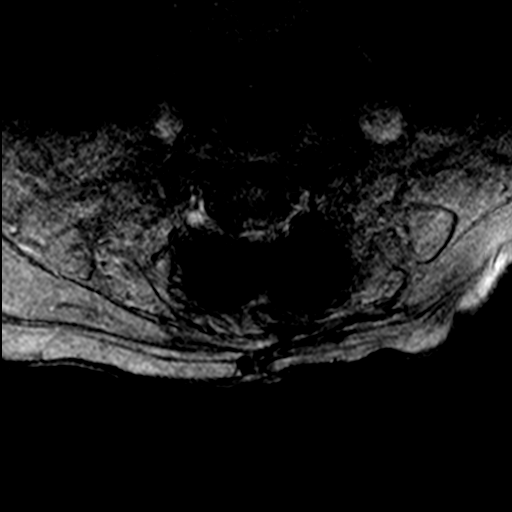
[im 17/31]
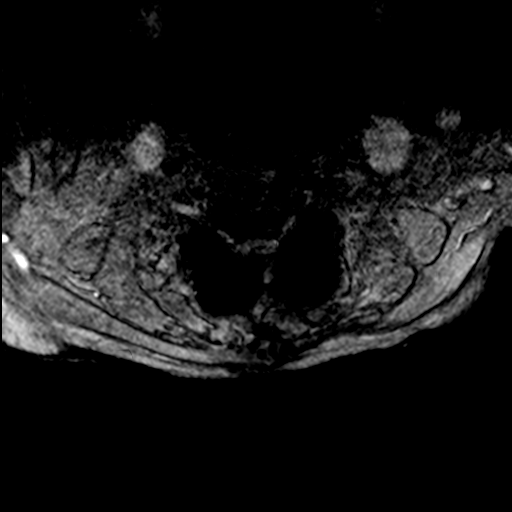
[im 21/31]
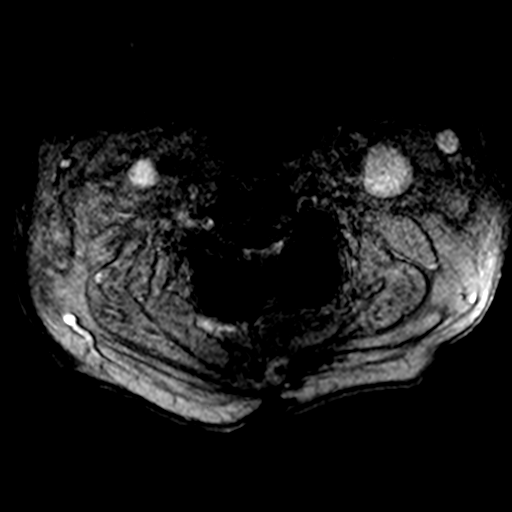
[im 26/31]
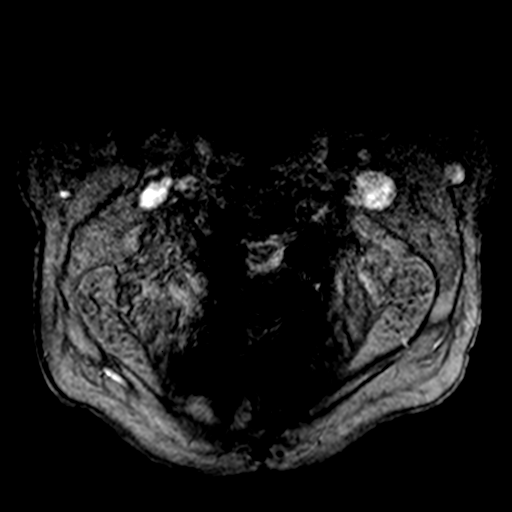
[im 31/31]
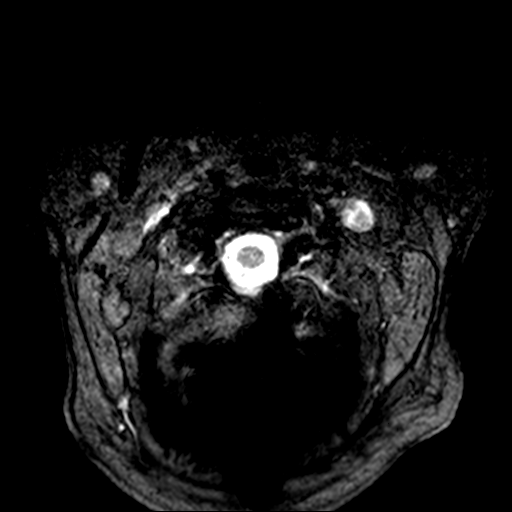

[42 of 48 positions shown; findings below may reference images not displayed]

FINDINGS: Atlanto-occipital assimilation. There is occipital to T2 posterior
fixation with screws present at most levels. Artifact from the
hardware causes severe signal distortion. No marrow signal
abnormality suggestive of fracture, infection, or neoplasm. There is
fatty marrow conversion at the C3 and C4 levels which is
degenerative. No acute extra-spinal findings to explain pain. T2
hyperintensity in the right pedicle and vertebral body is a empty
screw tract.

Heavily relying on the sagittal T1 weighted imaging there is no
evidence for cord compression. There is no gross cord signal
abnormality. Posterior displacement of the cervical cord at C3 to C5
is likely deformation from the upper bony overgrowth rather than a
ventral fluid collection.

Degenerative changes:

Atlanto occipital assimilation.

C2-3: Chronic anterolisthesis with disc bulging contacting the
ventral cord. The posterior elements are fused on previous CT. There
is right foraminal stenosis from spurring, grading not possible.

C3-4: Bulky posterior ridging and uncovertebral spurring. The
foramina are grossly patent.

C4-5: Uncovertebral spurring bilaterally. No advanced canal
stenosis. When accounting for presumed flow artifact, there is no
definitive canal stenosis. Grossly patent foramina.

C5-6: Patent canal and foramina

C6-7: Patent canal and foramina

C7-T1:Patent canal and foramina
IMPRESSION: 1. Atlanto occipital non-segmentation with accelerated upper
cervical degenerative disc disease. Patient is status post
decompressive laminectomies with occipital to T2 posterior fixation.
Associated hardware obscures the cervical cord from C2 to C5,
significantly limiting MRI.
2. C2-3 probable residual canal stenosis without visible cord
compression. Given symptoms, consider referral for myelogram.
3. C2-3 right foraminal stenosis from spurring.

.

## 2016-06-28 IMAGING — CT CT T SPINE W/O CM
1 of 5 series · 4 of 14 positions shown, 5 images · non-contrast
Comparison: Thoracic spine radiography 08/04/2014

CLINICAL DATA: Fall. Tingling and stiffness in the hands and below
the knees.

EXAM:
CT THORACIC SPINE WITHOUT CONTRAST
TECHNIQUE: Multidetector CT imaging of the thoracic spine was performed without
intravenous contrast administration. Multiplanar CT image
reconstructions were also generated.

[Series 12: t spine soft · axial · 0.35mm/px · z∈[-522,-282]mm · 4 of 202 slices shown, 5 images]
[im 41/202  soft-tissue]
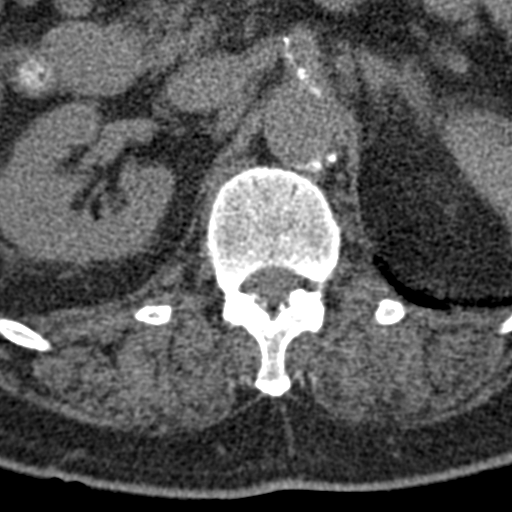
[im 41/202  bone]
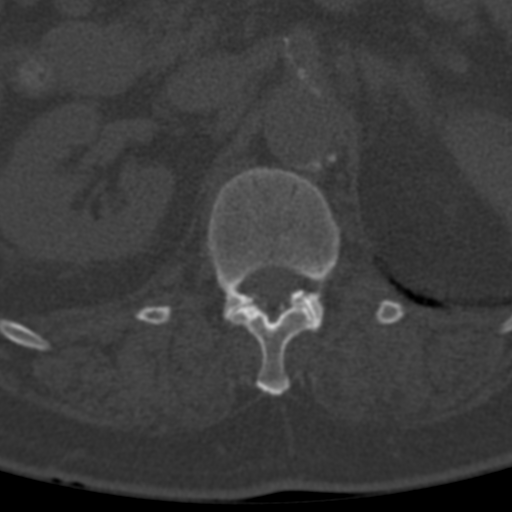
[im 81/202  bone]
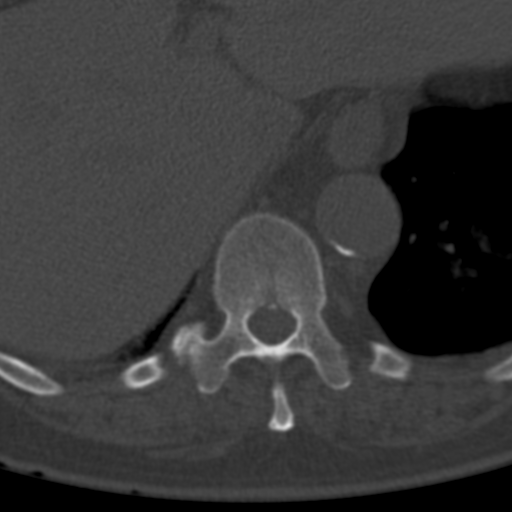
[im 121/202  bone]
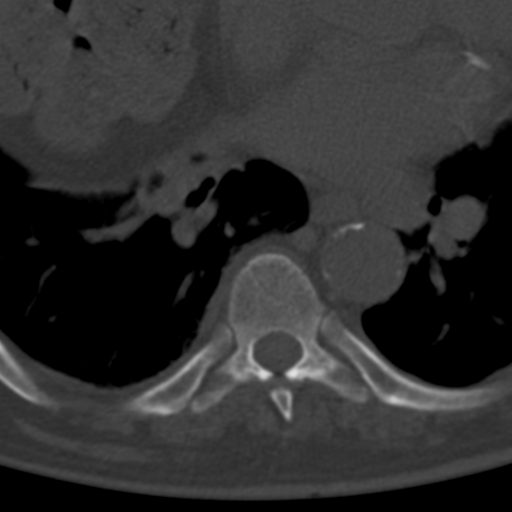
[im 161/202  bone]
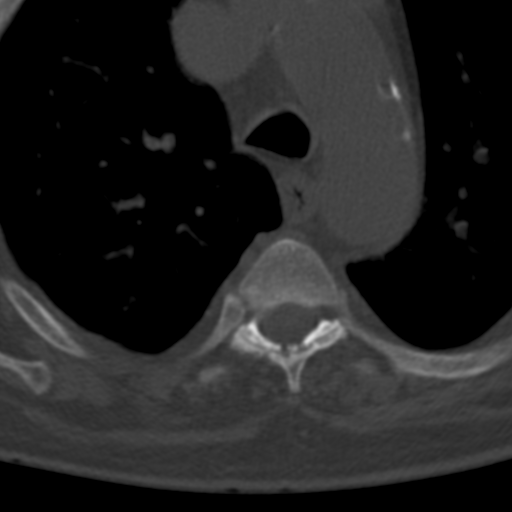

[4 of 14 positions shown; findings below may reference images not displayed]

FINDINGS: There is extensive posterior cervical spine fixation which extends
to T2. There is lucency around the T2 screws consistent with
loosening. The right pedicle screw is chronically external to the
narrow pedicle. There is empty screw track within the right T1
vertebra. No evidence of hardware fracture or displacement.

No acute fracture or traumatic malalignment. There is diffuse
degenerative narrowing without focally progressed endplate
degenerative change or subluxation. No gross canal hematoma or
compressive herniation.

Cholelithiasis. Elevation of the right diaphragm with overlying
atelectasis.
IMPRESSION: 1. No evidence of acute thoracic spine injury.
2. Posterior cervical to upper thoracic fixation with chronic
loosening of T2 screws.
3. Cholelithiasis.

## 2016-06-29 ENCOUNTER — Telehealth: Payer: Self-pay | Admitting: Gastroenterology

## 2016-06-29 ENCOUNTER — Other Ambulatory Visit: Payer: Self-pay | Admitting: Internal Medicine

## 2016-06-29 DIAGNOSIS — Z789 Other specified health status: Secondary | ICD-10-CM

## 2016-06-29 NOTE — Telephone Encounter (Signed)
Left a message for Chad Huffman to call back

## 2016-06-30 NOTE — Telephone Encounter (Signed)
Patient needed PEG removed, but it had been placed by IR and they have arranged for them to remove

## 2016-07-02 ENCOUNTER — Ambulatory Visit (HOSPITAL_COMMUNITY)
Admission: RE | Admit: 2016-07-02 | Discharge: 2016-07-02 | Disposition: A | Discharge status: Home / Self Care | Payer: No Typology Code available for payment source | Source: Ambulatory Visit | Attending: Internal Medicine | Admitting: Internal Medicine

## 2016-07-02 ENCOUNTER — Encounter (HOSPITAL_COMMUNITY): Payer: Self-pay | Admitting: Radiology

## 2016-07-02 DIAGNOSIS — Z789 Other specified health status: Secondary | ICD-10-CM

## 2016-07-02 DIAGNOSIS — Z431 Encounter for attention to gastrostomy: Secondary | ICD-10-CM | POA: Diagnosis present

## 2016-07-02 HISTORY — PX: IR GENERIC HISTORICAL: IMG1180011

## 2016-07-05 ENCOUNTER — Encounter: Payer: Self-pay | Admitting: Internal Medicine

## 2016-07-05 ENCOUNTER — Non-Acute Institutional Stay (SKILLED_NURSING_FACILITY): Payer: Medicare Other | Admitting: Internal Medicine

## 2016-07-05 DIAGNOSIS — M542 Cervicalgia: Secondary | ICD-10-CM | POA: Diagnosis not present

## 2016-07-05 DIAGNOSIS — J42 Unspecified chronic bronchitis: Secondary | ICD-10-CM

## 2016-07-05 DIAGNOSIS — M549 Dorsalgia, unspecified: Secondary | ICD-10-CM

## 2016-07-05 DIAGNOSIS — I10 Essential (primary) hypertension: Secondary | ICD-10-CM

## 2016-07-05 DIAGNOSIS — G8929 Other chronic pain: Secondary | ICD-10-CM

## 2016-07-05 DIAGNOSIS — R600 Localized edema: Secondary | ICD-10-CM

## 2016-07-05 DIAGNOSIS — K219 Gastro-esophageal reflux disease without esophagitis: Secondary | ICD-10-CM

## 2016-07-05 DIAGNOSIS — F418 Other specified anxiety disorders: Secondary | ICD-10-CM | POA: Diagnosis not present

## 2016-07-05 DIAGNOSIS — R1314 Dysphagia, pharyngoesophageal phase: Secondary | ICD-10-CM | POA: Diagnosis not present

## 2016-07-05 DIAGNOSIS — J9611 Chronic respiratory failure with hypoxia: Secondary | ICD-10-CM

## 2016-07-05 NOTE — Progress Notes (Signed)
Patient ID: Chad Huffman, male   DOB: Feb 26, 1946, 71 y.o.   MRN: [id number]    DATE: 07/05/2016  Location:    [facility] Room Number: 993 B Place of Service: SNF (31)   Extended Emergency Contact Information Primary Emergency Contact: [place], [place] Phone: [phone] Work Phone: [phone] Relation: Brother Secondary Emergency Contact: Rosezetta Schlatter, [place] of [place] Mobile Phone: [phone] Relation: Other  Advanced Directive information Does Patient Have a Medical Advance Directive?: Yes, Type of Advance Directive: Out of facility DNR (pink MOST or yellow form), Pre-existing out of facility DNR order (yellow form or pink MOST form): Yellow form placed in chart (order not valid for inpatient use)  Chief Complaint  Patient presents with  . Discharge Note    HPI:  71 yo male short term rehab resident seen today for d/c from SNF. He will be d/c'd home with [facility] PT/OT. He is c/a meds that he will be d/c'd with. He has appt to see PCP next month. He did have G tube removed by IR 3/61/44 without complication. Appetite is ok and no N/V/abdominal pain. No signs os aspiration. He requests flu shot prior to d/c   BRIEF SUMMARY OF HOSPITAL/REHAB STAY:  Admitted to SNF from [facility] following hospital stay for acute/chronic respiratory failure s/p trach, COPD, VRE UTI, bronchopneumonia. He presented to ED with signs/sx's c/w being down for extended period of time. He req'd emergent tracheostomy by ENT. G tube was placed 9/19th. At SNF he tolerated po intake and Peg used for nutritional supplement. He presented to the ED x 2 with Peg tube malfunction which was replaced by IR 05/26/16 and 05/28/16.   He was taken to the ED 10/27th due to change in MS, hypoxia and afll from bed. CXR neg and UA revealed no UTI. He was returned to SNF. He did see GI as o/p and barium esophagram ordered which did not  reveal esophageal stricture and ST recommended soft diet. He was deemed not candidate for sedation to perform EGD. Peg tube removed 07/02/16 by IR. He is a poor historian due to memory loss. Hx obtained from chart. CBG 111 today. He is on lovenox injections  COPD/chronic respiratory failure with hypoxia - takes pulmicort, duonebs, prednisone, [place] O2  Depression/anxiety - mood stable on klonopin, seroquel, sertraline  Chronic pain syndrome/DJD/peripheral neuropathy - on roxicodone  GERD - stable on pepcid BID; reglan  Edema/HTN - stable on demadex  Dysphagia - s/p Peg tube for TF. He gets nutritional supplements and MVI  Hx AAA s/p repair  Past Medical History:  Diagnosis Date  . Antalgic gait   . Carotid artery occlusion   . Chronic neck and back pain   . Complication of anesthesia    pt has had cervical fusion-limited neck flextion  . COPD (chronic obstructive pulmonary disease) ([place])   . Depression   . DJD (degenerative joint disease)    Right shoulder  . Hypertension   . Lumbago   . Peripheral neuropathy [facility])     Past Surgical History:  Procedure Laterality Date  . ABDOMINAL AORTIC ANEURYSM REPAIR  2009   Endovascular AAA repair  . ABDOMINAL AORTIC ANEURYSM REPAIR    . CARPAL TUNNEL RELEASE     bilateral CTS  . HAMMER TOE SURGERY  12/10/2011   Procedure: HAMMER TOE CORRECTION;  Surgeon: Bennie Pierini  Hyatt, DPM;  Location: [place];  Service: Podiatry;  Laterality: Right;  Right hammertoe repair second right with 2.5x42 orthopro screw   . IR GENERIC HISTORICAL  04/05/2016   IR GASTROSTOMY TUBE MOD SED 04/05/2016 [facility]  . IR GENERIC HISTORICAL  05/12/2016   IR CM INJ ANY COLONIC TUBE W/FLUORO 05/12/2016 Jacqulynn Cadet, MD [facility]  . IR GENERIC HISTORICAL  05/26/2016   IR CM INJ ANY COLONIC TUBE W/FLUORO 05/26/2016 [facility]  . IR GENERIC HISTORICAL  05/28/2016   IR Day GASTRO/COLONIC TUBE PERCUT W/FLUORO 05/28/2016 Ascencion Dike, PA-C  [facility]  . IR GENERIC HISTORICAL  07/02/2016   IR GASTROSTOMY TUBE REMOVAL 07/02/2016 Ascencion Dike, PA-C [facility]  . METATARSAL OSTEOTOMY  12/10/2011   Procedure: METATARSAL OSTEOTOMY;  Surgeon: Tyson Dense, DPM;  Location: [place];  Service: Podiatry;  Laterality: Right;  Right Keller arthroplasty with size 5 silicone implant right great toe; second metatarsal osteotomy with 2.0x14 screw  . NECK SURGERY    . PERIPHERAL VASCULAR CATHETERIZATION N/A 02/11/2015   Procedure: Carotid Angiography;  Surgeon: Serafina Mitchell, MD;  Location: [place] CV LAB;  Service: Cardiovascular;  Laterality: N/A;  . PILONIDAL CYST / SINUS EXCISION    . Rotary cuff    . SPINE SURGERY  09/15/11   Scar tissue removed- back  . SPINE SURGERY  09/15/11   Scar tissue removed- back  . TONSILLECTOMY    . TRACHEOSTOMY TUBE PLACEMENT N/A 03/03/2016   Procedure: TRACHEOSTOMY;  Surgeon: Clyde Canterbury, MD;  Location: [facility] ORS;  Service: ENT;  Laterality: N/A;    Patient Care Team: Gildardo Cranker, DO as PCP - General (Internal Medicine) Max Villa Herb, DPM (Podiatry) Erby Pian, MD as Referring Physician (Specialist) Gaspar Cola, MD (Neurosurgery) Julieanne [place] Leeanne Mannan., MD (Rheumatology)  Social History   Social History  . Marital status: Single    Spouse name: N/A  . Number of children: N/A  . Years of education: N/A   Occupational History  . Not on file.   Social History Main Topics  . Smoking status: Former Smoker    Quit date: 08/30/2006  . Smokeless tobacco: Never Used  . Alcohol use 1.2 oz/week    2 Cans of beer per week     Comment: occasional use  . Drug use: No  . Sexual activity: Not on file   Other Topics Concern  . Not on file   Social History Narrative  . No narrative on file     reports that he quit smoking about 9 years ago. He has never used smokeless tobacco. He reports that he drinks about 1.2 oz of alcohol per week . He reports that he does not use  drugs.  Family History  Problem Relation Age of Onset  . Stomach cancer Mother   . Hypertension Brother   . Heart attack Father    Family Status  Relation Status  . Mother Deceased  . Brother Alive  . Father Deceased     There is no immunization history on file for this patient.  Allergies  Allergen Reactions  . Ampicillin Swelling and Other (See Comments)    Unable to obtain enough information to answer additional questions about this medication.  Has patient had a PCN reaction causing immediate rash, facial/tongue/throat swelling, SOB or lightheadedness with hypotension: Yes Has patient had a PCN reaction causing severe rash involving mucus membranes or skin necrosis: No Has patient had a PCN reaction  that required hospitalization No Has patient had a PCN reaction occurring within the last 10 years: Yes If all of the above answers are "NO", then may pr  . Nortriptyline Other (See Comments)    Sleep walking  . Brimonidine Other (See Comments)    Unknown reaction per MAR   . Cyclobenzaprine Other (See Comments)    Hallucinations  . Tizanidine Other (See Comments)    Hallucinations  . Gabapentin Rash    Medications: Patient's Medications  New Prescriptions   No medications on file  Previous Medications   BUDESONIDE (PULMICORT) 0.5 MG/2ML NEBULIZER SOLUTION    Take 0.5 mg by nebulization 2 (two) times daily.   CLONAZEPAM (KLONOPIN) 0.5 MG TABLET    Place 0.5 mg into feeding tube 2 (two) times daily.    DOCUSATE SODIUM (COLACE) 100 MG CAPSULE    Take 100 mg by mouth 2 (two) times daily.    ENOXAPARIN (LOVENOX) 40 MG/0.4ML INJECTION    Inject 40 mg into the skin daily.   FAMOTIDINE (PEPCID) 20 MG TABLET    Place 20 mg into feeding tube 2 (two) times daily.    FOLIC ACID (FOLVITE) 1 MG TABLET    Place 1 mg into feeding tube daily.    IPRATROPIUM-ALBUTEROL (DUONEB) 0.5-2.5 (3) MG/3ML SOLN    Take 3 mLs by nebulization 3 (three) times daily.   METOCLOPRAMIDE (REGLAN) 5 MG  TABLET    Place 2.5 mg into feeding tube every 6 (six) hours.   MULTIPLE VITAMIN (MULTIVITAMIN WITH MINERALS) TABS TABLET    Take 1 tablet by mouth daily.   NUTRITIONAL SUPPLEMENTS (FEEDING SUPPLEMENT, GLUCERNA 1.5 CAL,) LIQD    Place 60 mL/hr into feeding tube continuous.   NUTRITIONAL SUPPLEMENTS (NUTRITIONAL DRINK) LIQD    Take 1 Bottle by mouth 2 (two) times daily. For Prophylaxis   OXYCODONE (OXY IR/ROXICODONE) 5 MG IMMEDIATE RELEASE TABLET    Place 5 mg into feeding tube every 8 (eight) hours.    OXYGEN    4 L/min by Intratracheal route as needed (for shortness of breath).    PREDNISONE (DELTASONE) 5 MG TABLET    Place 5 mg into feeding tube daily.    QUETIAPINE (SEROQUEL) 50 MG TABLET    Place 100 mg into feeding tube at bedtime.    SERTRALINE (ZOLOFT) 50 MG TABLET    Place 75 mg into feeding tube daily.    TORSEMIDE (DEMADEX) 20 MG TABLET    Place 20 mg into feeding tube daily.   Modified Medications   No medications on file  Discontinued Medications   No medications on file    Review of Systems  Unable to perform ROS: Other (memory loss)    Vitals:   07/05/16 1356  BP: 128/76  Pulse: 77  Resp: 20  Temp: 98.3 F (36.8 C)  TempSrc: Oral  SpO2: 91%  Weight: 203 lb (92.1 kg)  Height: _0  (1.778 m)   Body mass index is 29.13 kg/m.  Physical Exam  Constitutional: He appears well-developed and well-nourished.  Looks well in NAD, sitting up in bed  Musculoskeletal: He exhibits edema and tenderness.  Neurological: He is alert.  Psychiatric: He has a normal mood and affect. His behavior is normal. Thought content normal.     Labs reviewed: Lab on 06/18/2016  Component Date Value Ref Range Status  . Hemoglobin 06/10/2016 11.2* 13.5 - 17.5 g/dL Final  . HCT 06/10/2016 37* 41 - 53 % Final  . Platelets 06/10/2016 135* 150 -  399 K/L Final  . WBC 06/10/2016 7.3  10^3/mL Final  . Glucose 06/10/2016 87  mg/dL Final  . BUN 06/10/2016 14  4 - 21 mg/dL Final  . Creatinine  06/10/2016 0.9  0.6 - 1.3 mg/dL Final  . Potassium 06/10/2016 3.8  3.4 - 5.3 mmol/L Final  . Sodium 06/10/2016 139  137 - 147 mmol/L Final  . Hemoglobin A1C 06/10/2016 5.6   Final  Nursing Home on 04/28/2016  Component Date Value Ref Range Status  . Hemoglobin 04/13/2016 10.5* 13.5 - 17.5 g/dL Final  . HCT 04/13/2016 35* 41 - 53 % Final  . Platelets 04/13/2016 107* 150 - 399 K/L Final  . WBC 04/13/2016 19.6  10^3/mL Final  . Glucose 04/13/2016 109  mg/dL Final  . BUN 04/13/2016 52* 4 - 21 mg/dL Final  . Creatinine 04/13/2016 1.0  0.6 - 1.3 mg/dL Final  . Potassium 04/13/2016 4.1  3.4 - 5.3 mmol/L Final  . Sodium 04/13/2016 147  137 - 147 mmol/L Final  Admission on 04/16/2016, Discharged on 04/16/2016  Component Date Value Ref Range Status  . Sodium 04/16/2016 135  135 - 145 mmol/L Final  . Potassium 04/16/2016 3.8  3.5 - 5.1 mmol/L Final  . Chloride 04/16/2016 89* 101 - 111 mmol/L Final  . CO2 04/16/2016 39* 22 - 32 mmol/L Final  . Glucose, Bld 04/16/2016 102* 65 - 99 mg/dL Final  . BUN 04/16/2016 40* 6 - 20 mg/dL Final  . Creatinine, Ser 04/16/2016 0.98  0.61 - 1.24 mg/dL Final  . Calcium 04/16/2016 8.6* 8.9 - 10.3 mg/dL Final  . GFR calc non Af Amer 04/16/2016 >60  >60 mL/min Final  . GFR calc Af Amer 04/16/2016 >60  >60 mL/min Final   Comment: (NOTE) The eGFR has been calculated using the CKD EPI equation. This calculation has not been validated in all clinical situations. eGFR's persistently <60 mL/min signify possible Chronic Kidney Disease.   . Anion gap 04/16/2016 7  5 - 15 Final  . WBC 04/16/2016 15.2* 4.0 - 10.5 K/uL Final  . RBC 04/16/2016 3.38* 4.22 - 5.81 MIL/uL Final  . Hemoglobin 04/16/2016 9.9* 13.0 - 17.0 g/dL Final  . HCT 04/16/2016 32.5* 39.0 - 52.0 % Final  . MCV 04/16/2016 96.2  78.0 - 100.0 fL Final  . MCH 04/16/2016 29.3  26.0 - 34.0 pg Final  . MCHC 04/16/2016 30.5  30.0 - 36.0 g/dL Final  . RDW 04/16/2016 17.8* 11.5 - 15.5 % Final  . Platelets  04/16/2016 122* 150 - 400 K/uL Final  . Neutrophils Relative % 04/16/2016 74  % Final  . Lymphocytes Relative 04/16/2016 12  % Final  . Monocytes Relative 04/16/2016 6  % Final  . Eosinophils Relative 04/16/2016 8  % Final  . Basophils Relative 04/16/2016 0  % Final  . Neutro Abs 04/16/2016 11.3* 1.7 - 7.7 K/uL Final  . Lymphs Abs 04/16/2016 1.8  0.7 - 4.0 K/uL Final  . Monocytes Absolute 04/16/2016 0.9  0.1 - 1.0 K/uL Final  . Eosinophils Absolute 04/16/2016 1.2* 0.0 - 0.7 K/uL Final  . Basophils Absolute 04/16/2016 0.0  0.0 - 0.1 K/uL Final  . RBC Morphology 04/16/2016 POLYCHROMASIA PRESENT   Final  . WBC Morphology 04/16/2016 MILD LEFT SHIFT (1-5% METAS, OCC MYELO, OCC BANDS)   Final  . Smear Review 04/16/2016 LARGE PLATELETS PRESENT   Final  . Color, Urine 04/16/2016 YELLOW  YELLOW Final  . APPearance 04/16/2016 CLEAR  CLEAR Final  . Specific Gravity,  Urine 04/16/2016 1.017  1.005 - 1.030 Final  . pH 04/16/2016 7.0  5.0 - 8.0 Final  . Glucose, UA 04/16/2016 NEGATIVE  NEGATIVE mg/dL Final  . Hgb urine dipstick 04/16/2016 NEGATIVE  NEGATIVE Final  . Bilirubin Urine 04/16/2016 NEGATIVE  NEGATIVE Final  . Ketones, ur 04/16/2016 NEGATIVE  NEGATIVE mg/dL Final  . Protein, ur 04/16/2016 NEGATIVE  NEGATIVE mg/dL Final  . Nitrite 04/16/2016 NEGATIVE  NEGATIVE Final  . Leukocytes, UA 04/16/2016 NEGATIVE  NEGATIVE Final  . Troponin I 04/16/2016 <0.03  <0.03 ng/mL Final  Admission on 03/09/2016, Discharged on 04/12/2016  No results displayed because visit has over 200 results.      Ir Gastrostomy Tube Removal  Result Date: 07/02/2016 INDICATION: Dysphagia -resolved. Request removal of percutaneous gastrostomy tube. EXAM: IR TUBE REMOVAL GASTROSTOMY MEDICATIONS: None; ANESTHESIA/SEDATION: Moderate Sedation Time:  None The patient was continuously monitored during the procedure by the interventional radiology nurse under my direct supervision. CONTRAST:  None FLUOROSCOPY TIME:  Fluoroscopy  Time: None COMPLICATIONS: None immediate. PROCEDURE: Informed written consent was obtained from the patient after a thorough discussion of the procedural risks, benefits and alternatives. All questions were addressed. Maximal Sterile Barrier Technique was utilized including caps, mask, sterile gowns, sterile gloves, sterile drape, hand hygiene and skin antiseptic. A timeout was performed prior to the initiation of the procedure. A 20 French balloon retention gastrostomy tube was identified in the left upper quadrant. Balloon was deflated. Tube was removed intact without complication. Sterile dressing applied. IMPRESSION: Successful removal of 20 French balloon retention gastrostomy tube. Read by: Ascencion Dike PA-C Electronically Signed   By: Aletta Edouard M.D.   On: 07/02/2016 10:50     Assessment/Plan   ICD-9-CM ICD-10-CM   1. Depression with anxiety 300.4 F41.8   2. Dysphagia, pharyngoesophageal phase 787.24 R13.14   3. GERD without esophagitis 530.81 K21.9   4. Chronic respiratory failure with hypoxia (H[facility]) 518.83 J96.11    799.02    5. Chronic bronchitis, unspecified chronic bronchitis type ([place]) 491.9 J42   6. Bilateral lower extremity edema 782.3 R60.0   7. Chronic neck and back pain 723.1 M54.2    724.5 M54.9    with peripiheral neuropathy  8. Essential hypertension 401.9 I10     influenza vaccine given prior to d/c  Patient is being discharged with home health services:  PT/OT  Patient is being discharged with the following durable medical equipment: None. He has equipment at home for O2, nebulizer machine    Patient has been advised to f/u with their PCP in 1-2 weeks to bring them up to date on their rehab stay.  They were provided with a 30 day supply of scripts for prescription medications and refills must be obtained from their PCP.  TIME SPENT (MINUTES): [place]. Perlie Gold  [facility] [address] [place], [place] [address] [phone] Cell (Monday-Friday 8 AM - 5 PM) [phone] After 5 PM and follow prompts

## 2016-07-16 ENCOUNTER — Emergency Department: Payer: Medicare Other

## 2016-07-16 ENCOUNTER — Inpatient Hospital Stay
Admission: EM | Admit: 2016-07-16 | Discharge: 2016-07-27 | DRG: 207 | Disposition: A | Discharge status: Skilled Nursing Facility | Payer: Medicare Other | Attending: Internal Medicine | Admitting: Internal Medicine

## 2016-07-16 ENCOUNTER — Inpatient Hospital Stay: Payer: Medicare Other

## 2016-07-16 ENCOUNTER — Encounter: Payer: Self-pay | Admitting: Emergency Medicine

## 2016-07-16 DIAGNOSIS — Z9911 Dependence on respirator [ventilator] status: Secondary | ICD-10-CM | POA: Diagnosis not present

## 2016-07-16 DIAGNOSIS — F329 Major depressive disorder, single episode, unspecified: Secondary | ICD-10-CM | POA: Diagnosis present

## 2016-07-16 DIAGNOSIS — Z7951 Long term (current) use of inhaled steroids: Secondary | ICD-10-CM | POA: Diagnosis not present

## 2016-07-16 DIAGNOSIS — I959 Hypotension, unspecified: Secondary | ICD-10-CM | POA: Diagnosis present

## 2016-07-16 DIAGNOSIS — R2681 Unsteadiness on feet: Secondary | ICD-10-CM

## 2016-07-16 DIAGNOSIS — Z7952 Long term (current) use of systemic steroids: Secondary | ICD-10-CM | POA: Diagnosis not present

## 2016-07-16 DIAGNOSIS — G9340 Encephalopathy, unspecified: Secondary | ICD-10-CM | POA: Diagnosis not present

## 2016-07-16 DIAGNOSIS — R52 Pain, unspecified: Secondary | ICD-10-CM

## 2016-07-16 DIAGNOSIS — J441 Chronic obstructive pulmonary disease with (acute) exacerbation: Secondary | ICD-10-CM | POA: Diagnosis present

## 2016-07-16 DIAGNOSIS — Z981 Arthrodesis status: Secondary | ICD-10-CM | POA: Diagnosis not present

## 2016-07-16 DIAGNOSIS — J988 Other specified respiratory disorders: Secondary | ICD-10-CM | POA: Diagnosis not present

## 2016-07-16 DIAGNOSIS — Z23 Encounter for immunization: Secondary | ICD-10-CM | POA: Diagnosis present

## 2016-07-16 DIAGNOSIS — Z79899 Other long term (current) drug therapy: Secondary | ICD-10-CM | POA: Diagnosis not present

## 2016-07-16 DIAGNOSIS — J9622 Acute and chronic respiratory failure with hypercapnia: Secondary | ICD-10-CM | POA: Diagnosis present

## 2016-07-16 DIAGNOSIS — R1310 Dysphagia, unspecified: Secondary | ICD-10-CM | POA: Diagnosis not present

## 2016-07-16 DIAGNOSIS — Z9981 Dependence on supplemental oxygen: Secondary | ICD-10-CM | POA: Diagnosis not present

## 2016-07-16 DIAGNOSIS — I493 Ventricular premature depolarization: Secondary | ICD-10-CM | POA: Diagnosis present

## 2016-07-16 DIAGNOSIS — M6281 Muscle weakness (generalized): Secondary | ICD-10-CM

## 2016-07-16 DIAGNOSIS — G8929 Other chronic pain: Secondary | ICD-10-CM | POA: Diagnosis present

## 2016-07-16 DIAGNOSIS — I110 Hypertensive heart disease with heart failure: Secondary | ICD-10-CM | POA: Diagnosis present

## 2016-07-16 DIAGNOSIS — G629 Polyneuropathy, unspecified: Secondary | ICD-10-CM | POA: Diagnosis present

## 2016-07-16 DIAGNOSIS — D72829 Elevated white blood cell count, unspecified: Secondary | ICD-10-CM | POA: Diagnosis present

## 2016-07-16 DIAGNOSIS — J9602 Acute respiratory failure with hypercapnia: Secondary | ICD-10-CM

## 2016-07-16 DIAGNOSIS — J9601 Acute respiratory failure with hypoxia: Secondary | ICD-10-CM

## 2016-07-16 DIAGNOSIS — I5033 Acute on chronic diastolic (congestive) heart failure: Secondary | ICD-10-CM | POA: Diagnosis present

## 2016-07-16 DIAGNOSIS — I10 Essential (primary) hypertension: Secondary | ICD-10-CM | POA: Diagnosis present

## 2016-07-16 DIAGNOSIS — J9621 Acute and chronic respiratory failure with hypoxia: Secondary | ICD-10-CM | POA: Diagnosis present

## 2016-07-16 DIAGNOSIS — Z7901 Long term (current) use of anticoagulants: Secondary | ICD-10-CM | POA: Diagnosis not present

## 2016-07-16 DIAGNOSIS — D696 Thrombocytopenia, unspecified: Secondary | ICD-10-CM

## 2016-07-16 DIAGNOSIS — J9690 Respiratory failure, unspecified, unspecified whether with hypoxia or hypercapnia: Secondary | ICD-10-CM

## 2016-07-16 DIAGNOSIS — D649 Anemia, unspecified: Secondary | ICD-10-CM | POA: Diagnosis present

## 2016-07-16 DIAGNOSIS — Z931 Gastrostomy status: Secondary | ICD-10-CM

## 2016-07-16 DIAGNOSIS — Z4659 Encounter for fitting and adjustment of other gastrointestinal appliance and device: Secondary | ICD-10-CM

## 2016-07-16 DIAGNOSIS — Z8249 Family history of ischemic heart disease and other diseases of the circulatory system: Secondary | ICD-10-CM | POA: Diagnosis not present

## 2016-07-16 DIAGNOSIS — W19XXXA Unspecified fall, initial encounter: Secondary | ICD-10-CM | POA: Diagnosis not present

## 2016-07-16 DIAGNOSIS — J9811 Atelectasis: Secondary | ICD-10-CM | POA: Diagnosis present

## 2016-07-16 DIAGNOSIS — Z87891 Personal history of nicotine dependence: Secondary | ICD-10-CM | POA: Diagnosis not present

## 2016-07-16 DIAGNOSIS — Z8679 Personal history of other diseases of the circulatory system: Secondary | ICD-10-CM

## 2016-07-16 DIAGNOSIS — R0602 Shortness of breath: Secondary | ICD-10-CM | POA: Diagnosis present

## 2016-07-16 DIAGNOSIS — R29898 Other symptoms and signs involving the musculoskeletal system: Secondary | ICD-10-CM | POA: Diagnosis not present

## 2016-07-16 LAB — COMPREHENSIVE METABOLIC PANEL
ALBUMIN: 3.9 g/dL (ref 3.5–5.0)
ALT: 11 U/L — ABNORMAL LOW (ref 17–63)
ANION GAP: 5 (ref 5–15)
AST: 20 U/L (ref 15–41)
Alkaline Phosphatase: 60 U/L (ref 38–126)
BUN: 13 mg/dL (ref 6–20)
CO2: 40 mmol/L — AB (ref 22–32)
Calcium: 8.7 mg/dL — ABNORMAL LOW (ref 8.9–10.3)
Chloride: 89 mmol/L — ABNORMAL LOW (ref 101–111)
Creatinine, Ser: 0.88 mg/dL (ref 0.61–1.24)
GFR calc non Af Amer: >60 mL/min (ref >60)
GLUCOSE: 102 mg/dL — AB (ref 65–99)
POTASSIUM: 3.9 mmol/L (ref 3.5–5.1)
SODIUM: 134 mmol/L — AB (ref 135–145)
TOTAL PROTEIN: 6.6 g/dL (ref 6.5–8.1)
Total Bilirubin: 1.5 mg/dL — ABNORMAL HIGH (ref 0.3–1.2)

## 2016-07-16 LAB — CBC WITH DIFFERENTIAL/PLATELET
BASOS PCT: 0 %
Basophils Absolute: 0 10*3/uL (ref 0–0.1)
EOS ABS: 0 10*3/uL (ref 0–0.7)
EOS PCT: 1 %
HCT: 31 % — ABNORMAL LOW (ref 40.0–52.0)
Hemoglobin: 10.5 g/dL — ABNORMAL LOW (ref 13.0–18.0)
Lymphocytes Relative: 9 %
Lymphs Abs: 0.7 10*3/uL — ABNORMAL LOW (ref 1.0–3.6)
MCH: 30.6 pg (ref 26.0–34.0)
MCHC: 33.9 g/dL (ref 32.0–36.0)
MCV: 90 fL (ref 80.0–100.0)
MONO ABS: 0.9 10*3/uL (ref 0.2–1.0)
Monocytes Relative: 11 %
Neutro Abs: 6.4 10*3/uL (ref 1.4–6.5)
Neutrophils Relative %: 79 %
Platelets: 121 10*3/uL — ABNORMAL LOW (ref 150–440)
RBC: 3.44 MIL/uL — ABNORMAL LOW (ref 4.40–5.90)
RDW: 16.4 % — AB (ref 11.5–14.5)
WBC: 8.1 10*3/uL (ref 3.8–10.6)

## 2016-07-16 LAB — BLOOD GAS, ARTERIAL
Acid-Base Excess: 19.2 mmol/L — ABNORMAL HIGH (ref 0.0–2.0)
Acid-Base Excess: 12.3 mmol/L — ABNORMAL HIGH (ref 0.0–2.0)
BICARBONATE: 38.7 mmol/L — AB (ref 20.0–28.0)
Bicarbonate: 48.6 mmol/L — ABNORMAL HIGH (ref 20.0–28.0)
FIO2: 0.28
FIO2: 0.5
LHR: 15 {breaths}/min
MECHVT: 500 mL
O2 SAT: 99 %
O2 Saturation: 99.6 %
PEEP/CPAP: 5 cmH2O
PH ART: 7.35 (ref 7.350–7.450)
Patient temperature: 37
Patient temperature: 37
pCO2 arterial: 88 mmHg (ref 32.0–48.0)
pCO2 arterial: 61 mmHg — ABNORMAL HIGH (ref 32.0–48.0)
pH, Arterial: 7.41 (ref 7.350–7.450)
pO2, Arterial: 138 mmHg — ABNORMAL HIGH (ref 83.0–108.0)
pO2, Arterial: 179 mmHg — ABNORMAL HIGH (ref 83.0–108.0)

## 2016-07-16 LAB — GLUCOSE, CAPILLARY
GLUCOSE-CAPILLARY: 111 mg/dL — AB (ref 65–99)
Glucose-Capillary: 107 mg/dL — ABNORMAL HIGH (ref 65–99)
Glucose-Capillary: 114 mg/dL — ABNORMAL HIGH (ref 65–99)

## 2016-07-16 LAB — INFLUENZA PANEL BY PCR (TYPE A & B)
INFLAPCR: NEGATIVE
INFLBPCR: NEGATIVE

## 2016-07-16 LAB — MAGNESIUM: MAGNESIUM: 1.8 mg/dL (ref 1.7–2.4)

## 2016-07-16 LAB — TROPONIN I: Troponin I: <0.03 ng/mL (ref <0.03)

## 2016-07-16 LAB — MRSA PCR SCREENING: MRSA BY PCR: NEGATIVE

## 2016-07-16 LAB — PHOSPHORUS: Phosphorus: 2.8 mg/dL (ref 2.5–4.6)

## 2016-07-16 LAB — PROCALCITONIN

## 2016-07-16 LAB — BRAIN NATRIURETIC PEPTIDE: B NATRIURETIC PEPTIDE 5: 207 pg/mL — AB (ref 0.0–100.0)

## 2016-07-16 MED ORDER — FENTANYL 2500MCG IN NS 250ML (10MCG/ML) PREMIX INFUSION: 25.0000 ug/h | INTRAVENOUS | Status: DC

## 2016-07-16 MED ORDER — VANCOMYCIN HCL IN DEXTROSE 1-5 GM/200ML-% IV SOLN
1000.0000 mg | INTRAVENOUS | Status: AC
  Administered 2016-07-16: 1000 mg via INTRAVENOUS
  Filled 2016-07-16: qty 200

## 2016-07-16 MED ORDER — MIDAZOLAM HCL 2 MG/2ML IJ SOLN
1.0000 mg | INTRAMUSCULAR | Status: DC | PRN
  Administered 2016-07-16: 1 mg via INTRAVENOUS

## 2016-07-16 MED ORDER — SODIUM CHLORIDE 0.9 % IV SOLN
1.0000 g | Freq: Once | INTRAVENOUS | Status: DC
  Filled 2016-07-16: qty 1

## 2016-07-16 MED ORDER — CHLORHEXIDINE GLUCONATE 0.12% ORAL RINSE (MEDLINE KIT)
15.0000 mL | Freq: Two times a day (BID) | OROMUCOSAL | Status: DC
  Administered 2016-07-16 – 2016-07-17 (×2): 15 mL via OROMUCOSAL

## 2016-07-16 MED ORDER — SODIUM CHLORIDE 0.9 % IV SOLN: 250.0000 mL | INTRAVENOUS | Status: DC | PRN

## 2016-07-16 MED ORDER — MIDAZOLAM HCL 2 MG/2ML IJ SOLN: 1.0000 mg | INTRAMUSCULAR | Status: DC | PRN

## 2016-07-16 MED ORDER — MIDAZOLAM HCL 2 MG/2ML IJ SOLN
1.0000 mg | INTRAMUSCULAR | Status: DC | PRN
  Administered 2016-07-16 – 2016-07-17 (×3): 1 mg via INTRAVENOUS
  Filled 2016-07-16 (×4): qty 2

## 2016-07-16 MED ORDER — VITAL HIGH PROTEIN PO LIQD
1000.0000 mL | ORAL | Status: DC
  Administered 2016-07-16: 1000 mL

## 2016-07-16 MED ORDER — SODIUM CHLORIDE 0.9% FLUSH
3.0000 mL | INTRAVENOUS | Status: DC | PRN
  Administered 2016-07-23: 3 mL via INTRAVENOUS
  Filled 2016-07-16: qty 3

## 2016-07-16 MED ORDER — ENOXAPARIN SODIUM 40 MG/0.4ML ~~LOC~~ SOLN
40.0000 mg | SUBCUTANEOUS | Status: DC
  Administered 2016-07-16 – 2016-07-26 (×11): 40 mg via SUBCUTANEOUS
  Filled 2016-07-16 (×12): qty 0.4

## 2016-07-16 MED ORDER — MEROPENEM-SODIUM CHLORIDE 1 GM/50ML IV SOLR
1.0000 g | Freq: Once | INTRAVENOUS | Status: DC
  Filled 2016-07-16: qty 50

## 2016-07-16 MED ORDER — VANCOMYCIN HCL IN DEXTROSE 1-5 GM/200ML-% IV SOLN: 1000.0000 mg | Freq: Once | INTRAVENOUS | Status: DC

## 2016-07-16 MED ORDER — SODIUM CHLORIDE 0.9 % IV SOLN
1.0000 g | Freq: Three times a day (TID) | INTRAVENOUS | Status: DC
  Administered 2016-07-16: 1 g via INTRAVENOUS
  Filled 2016-07-16 (×5): qty 1

## 2016-07-16 MED ORDER — FAMOTIDINE 20 MG PO TABS
20.0000 mg | ORAL_TABLET | Freq: Two times a day (BID) | ORAL | Status: DC
  Administered 2016-07-16 – 2016-07-19 (×7): 20 mg via ORAL
  Filled 2016-07-16 (×7): qty 1

## 2016-07-16 MED ORDER — FENTANYL CITRATE (PF) 100 MCG/2ML IJ SOLN
100.0000 ug | Freq: Once | INTRAMUSCULAR | Status: AC
  Administered 2016-07-16: 100 ug via INTRAVENOUS

## 2016-07-16 MED ORDER — VANCOMYCIN HCL 10 G IV SOLR
1500.0000 mg | Freq: Two times a day (BID) | INTRAVENOUS | Status: DC
  Administered 2016-07-17: 1500 mg via INTRAVENOUS
  Filled 2016-07-16 (×3): qty 1500

## 2016-07-16 MED ORDER — SODIUM CHLORIDE 0.9 % IV SOLN
25.0000 ug/h | INTRAVENOUS | Status: DC
  Administered 2016-07-17: 400 ug/h via INTRAVENOUS
  Administered 2016-07-17: 25 ug/h via INTRAVENOUS
  Administered 2016-07-17 – 2016-07-19 (×7): 400 ug/h via INTRAVENOUS
  Administered 2016-07-19: 250 ug/h via INTRAVENOUS
  Administered 2016-07-19: 400 ug/h via INTRAVENOUS
  Administered 2016-07-20: 25 ug/h via INTRAVENOUS
  Administered 2016-07-20 – 2016-07-21 (×2): 200 ug/h via INTRAVENOUS
  Filled 2016-07-16 (×15): qty 50

## 2016-07-16 MED ORDER — SODIUM CHLORIDE 0.9% FLUSH
3.0000 mL | Freq: Two times a day (BID) | INTRAVENOUS | Status: DC
  Administered 2016-07-16 – 2016-07-24 (×12): 3 mL via INTRAVENOUS

## 2016-07-16 MED ORDER — FENTANYL CITRATE (PF) 100 MCG/2ML IJ SOLN: 50.0000 ug | Freq: Once | INTRAMUSCULAR | Status: DC

## 2016-07-16 MED ORDER — SODIUM CHLORIDE 0.9 % IV SOLN: 25.0000 ug/h | INTRAVENOUS | Status: DC

## 2016-07-16 MED ORDER — MEROPENEM-SODIUM CHLORIDE 1 GM/50ML IV SOLR
1.0000 g | Freq: Three times a day (TID) | INTRAVENOUS | Status: DC
  Administered 2016-07-16 – 2016-07-20 (×11): 1 g via INTRAVENOUS
  Filled 2016-07-16 (×13): qty 50

## 2016-07-16 MED ORDER — SODIUM CHLORIDE 0.9 % IV SOLN
INTRAVENOUS | Status: DC
  Administered 2016-07-16: 13:00:00 via INTRAVENOUS
  Filled 2016-07-16: qty 200

## 2016-07-16 MED ORDER — BUDESONIDE 0.25 MG/2ML IN SUSP
0.2500 mg | Freq: Two times a day (BID) | RESPIRATORY_TRACT | Status: DC
  Administered 2016-07-16 – 2016-07-19 (×6): 0.25 mg via RESPIRATORY_TRACT
  Filled 2016-07-16 (×7): qty 2

## 2016-07-16 MED ORDER — SODIUM CHLORIDE 0.9 % IV BOLUS (SEPSIS)
1000.0000 mL | Freq: Once | INTRAVENOUS | Status: AC
  Administered 2016-07-16: 1000 mL via INTRAVENOUS

## 2016-07-16 MED ORDER — FENTANYL CITRATE (PF) 2500 MCG/50ML IJ SOLN
40.0000 ug/h | Freq: Once | INTRAMUSCULAR | Status: DC
  Filled 2016-07-16: qty 50

## 2016-07-16 MED ORDER — FAMOTIDINE 40 MG/5ML PO SUSR: 20.0000 mg | Freq: Two times a day (BID) | ORAL | Status: DC

## 2016-07-16 MED ORDER — ORAL CARE MOUTH RINSE
15.0000 mL | Freq: Four times a day (QID) | OROMUCOSAL | Status: DC
  Administered 2016-07-16 – 2016-07-17 (×3): 15 mL via OROMUCOSAL

## 2016-07-16 MED ORDER — PRO-STAT SUGAR FREE PO LIQD
30.0000 mL | Freq: Two times a day (BID) | ORAL | Status: DC
  Administered 2016-07-16: 30 mL

## 2016-07-16 MED ORDER — IPRATROPIUM-ALBUTEROL 0.5-2.5 (3) MG/3ML IN SOLN
3.0000 mL | RESPIRATORY_TRACT | Status: DC
  Administered 2016-07-16 – 2016-07-19 (×15): 3 mL via RESPIRATORY_TRACT
  Filled 2016-07-16 (×16): qty 3

## 2016-07-16 MED ORDER — FENTANYL BOLUS VIA INFUSION
25.0000 ug | INTRAVENOUS | Status: DC | PRN
  Filled 2016-07-16: qty 25

## 2016-07-16 NOTE — ED Notes (Signed)
Brothers contact # at work  [phone]

## 2016-07-16 NOTE — Progress Notes (Signed)
Brief Nutrition Note  Consult received for assessment and recommendations of enteral nutrition; pt on vent support in ED awaiting transfer to ICU. Discussed with MD Kasa and received verbal order to start Adult Tube Feeding Protocol.   Adult Enteral Nutrition Protocol initiated. Full assessment to follow.  Admitting Dx: Fall  Body mass index is 29.13 kg/m.   Labs:   Recent Labs Lab 07/16/16 1103  NA 134*  K 3.9  CL 89*  CO2 40*  BUN 13  CREATININE 0.88  CALCIUM 8.7*  GLUCOSE 102*    84[address] Thatcher StreetCate Mikolaj Woolstenhulme MS, RD, LDN [phone] Pager  [phone] Weekend/On-Call Pager

## 2016-07-16 NOTE — ED Notes (Signed)
Pharmacy called and notified of need of Fentanyl drip.

## 2016-07-16 NOTE — Progress Notes (Signed)
Pharmacy Antibiotic Note  Chad Huffman is a 71 y.o. male admitted on 07/16/2016 with pneumonia.  Pharmacy has been consulted for vancomycin and meropenem dosing. Vancomycin and meropenem ordered in ED for 1330. Not documented as administered when patient admitted to unit at 1710.    Plan: Will order vancomycin 1g IV x 1 stat and start vancomycin 1500mg  IV Q12hr for goal trough of 15-20. Will obtain trough prior to am dose on 1/28.    Will start patient on meropenem 1g IV Q8hr.    Height: 5\' 10"  (177.8 cm) Weight: 203 lb (92.1 kg) IBW/kg (Calculated) : 73  Temp (24hrs), Avg:96.3 F (35.7 C), Min:94.4 F (34.7 C), Max:98.8 F (37.1 C)   Recent Labs Lab 07/16/16 1103  WBC 8.1  CREATININE 0.88    Estimated Creatinine Clearance: 91.6 mL/min (by C-G formula based on SCr of 0.88 mg/dL).    Allergies  Allergen Reactions  . Ampicillin Swelling and Other (See Comments)    Unable to obtain enough information to answer additional questions about this medication.  Has patient had a PCN reaction causing immediate rash, facial/tongue/throat swelling, SOB or lightheadedness with hypotension: Yes Has patient had a PCN reaction causing severe rash involving mucus membranes or skin necrosis: No Has patient had a PCN reaction that required hospitalization No Has patient had a PCN reaction occurring within the last 10 years: Yes If all of the above answers are "NO", then may pr  . Nortriptyline Other (See Comments)    Sleep walking  . Brimonidine Other (See Comments)    Unknown reaction per MAR   . Cyclobenzaprine Other (See Comments)    Hallucinations  . Tizanidine Other (See Comments)    Hallucinations  . Gabapentin Rash    Antimicrobials this admission: Vancomycin 1/26 >>  Meropenem 1/26  >>   Dose adjustments this admission: N/A  Microbiology results: 1/26 BCx: pending  1/26 MRSA PCR: pending   Pharmacy will continue to monitor and adjust per consult.    Somara Frymire  L 07/16/2016 5:09 PM

## 2016-07-16 NOTE — ED Notes (Signed)
Patient hypotensive at this time. MAP remains in 70's. Verbal order from Dr. Roxan Hockeyobinson to give 1L NS bolus.

## 2016-07-16 NOTE — ED Notes (Signed)
RT at bedside.

## 2016-07-16 NOTE — Progress Notes (Signed)
Received report from [place] in ED, states that she has not drawn stat blood cultures and labs because she has not had patient longer then 6 hours? Advised my charge nurse Pam regarding the situation. She will be calling down to the ED. Antibiotics have not been given calling lab to draw cultures.

## 2016-07-16 NOTE — ED Notes (Signed)
Informed RN bed ready [phone]

## 2016-07-16 NOTE — H&P (Signed)
PULMONARY / CRITICAL CARE MEDICINE   Name: Chad Huffman MRN: [id number] DOB: 12/29/1945    ADMISSION DATE:  07/16/2016 CONSULTATION DATE:  07/16/16  REFERRING MD:  Dr. Merlyn Lot  CHIEF COMPLAINT:  Altered Mental Status and Acute Hypercapnic Respiratory Failure  HISTORY OF PRESENT ILLNESS:   Chad Huffman is a 71 y.o. male with a PMH of Peripheral Neuropathy, Lumbago, Hypertension, Former smoker, DJD, Depression, COPD (required tracheostomy that was recently decannulated 2 weeks prior to presentation to ER), Chronic neck and back pain, Cervical fusion-limited neck flexion secondary complication of anesthesia, Carotid artery occlusion, and Antalgic gait.  Per ER notes pts daughter stated she was unable to get in contact with the patient overnight 1/25, he was at baseline during the day on 01/25.  Police and EMS were called 1/26 to check on the patient, upon EMS arrival he was found on the floor following a fall.  He was alert, but confused to time and situation.  In the ER CT Head negative for any acute abnormalities, however the pt had a steady decline in his mental status concerning for hypercapnic respiratory failure secondary to AECOPD and ?Pneumonia requiring mechanical intubation.  PCCM contacted 01/26 to admit the pt to ICU.   PAST MEDICAL HISTORY :  He  has a past medical history of Antalgic gait; Carotid artery occlusion; Chronic neck and back pain; Complication of anesthesia; COPD (chronic obstructive pulmonary disease) ([place]); Depression; DJD (degenerative joint disease); Hypertension; Lumbago; and Peripheral neuropathy ([place]).  PAST SURGICAL HISTORY: He  has a past surgical history that includes Tonsillectomy; Carpal tunnel release; Abdominal aortic aneurysm repair (2009); Pilonidal cyst / sinus excision; Spine surgery (09/15/11); Spine surgery (09/15/11); Neck surgery; Rotary cuff; Hammer toe surgery (12/10/2011); Metatarsal osteotomy (12/10/2011); Abdominal aortic aneurysm repair; Cardiac  catheterization (N/A, 02/11/2015); Tracheostomy tube placement (N/A, 03/03/2016); ir generic historical (04/05/2016); ir generic historical (05/12/2016); ir generic historical (05/26/2016); ir generic historical (05/28/2016); and ir generic historical (07/02/2016).  Allergies  Allergen Reactions  . Ampicillin Swelling and Other (See Comments)    Unable to obtain enough information to answer additional questions about this medication.  Has patient had a PCN reaction causing immediate rash, facial/tongue/throat swelling, SOB or lightheadedness with hypotension: Yes Has patient had a PCN reaction causing severe rash involving mucus membranes or skin necrosis: No Has patient had a PCN reaction that required hospitalization No Has patient had a PCN reaction occurring within the last 10 years: Yes If all of the above answers are "NO", then may pr  . Nortriptyline Other (See Comments)    Sleep walking  . Brimonidine Other (See Comments)    Unknown reaction per MAR   . Cyclobenzaprine Other (See Comments)    Hallucinations  . Tizanidine Other (See Comments)    Hallucinations  . Gabapentin Rash    No current facility-administered medications on file prior to encounter.    Current Outpatient Prescriptions on File Prior to Encounter  Medication Sig  . predniSONE (DELTASONE) 5 MG tablet Place 5 mg into feeding tube daily.   . budesonide (PULMICORT) 0.5 MG/2ML nebulizer solution Take 0.5 mg by nebulization 2 (two) times daily.  . clonazePAM (KLONOPIN) 0.5 MG tablet Place 0.5 mg into feeding tube 2 (two) times daily.   Marland Kitchen docusate sodium (COLACE) 100 MG capsule Take 100 mg by mouth 2 (two) times daily.   Marland Kitchen enoxaparin (LOVENOX) 40 MG/0.4ML injection Inject 40 mg into the skin daily.  . famotidine (PEPCID) 20 MG tablet Place 20 mg into feeding  tube 2 (two) times daily.   . folic acid (FOLVITE) 1 MG tablet Place 1 mg into feeding tube daily.   Marland Kitchen ipratropium-albuterol (DUONEB) 0.5-2.5 (3) MG/3ML SOLN Take 3  mLs by nebulization 3 (three) times daily.  . metoCLOPramide (REGLAN) 5 MG tablet Place 2.5 mg into feeding tube every 6 (six) hours.  . Multiple Vitamin (MULTIVITAMIN WITH MINERALS) TABS tablet Take 1 tablet by mouth daily.  . Nutritional Supplements (FEEDING SUPPLEMENT, GLUCERNA 1.5 CAL,) LIQD Place 60 mL/hr into feeding tube continuous.  . Nutritional Supplements (NUTRITIONAL DRINK) LIQD Take 1 Bottle by mouth 2 (two) times daily. For Prophylaxis  . oxyCODONE (OXY IR/ROXICODONE) 5 MG immediate release tablet Place 5 mg into feeding tube every 8 (eight) hours.   . OXYGEN 4 L/min by Intratracheal route as needed (for shortness of breath).   . QUEtiapine (SEROQUEL) 50 MG tablet Place 100 mg into feeding tube at bedtime.   . sertraline (ZOLOFT) 50 MG tablet Place 75 mg into feeding tube daily.   Marland Kitchen torsemide (DEMADEX) 20 MG tablet Place 20 mg into feeding tube daily.     FAMILY HISTORY:  His indicated that his mother is deceased. He indicated that his father is deceased. He indicated that his brother is alive.    SOCIAL HISTORY: He  reports that he quit smoking about 9 years ago. He has never used smokeless tobacco. He reports that he drinks about 1.2 oz of alcohol per week . He reports that he does not use drugs.  REVIEW OF SYSTEMS:   Unable to access due to intubation and sedation  SUBJECTIVE:  Unable to access due to intubation and sedation  VITAL SIGNS: BP (!) 88/63   Pulse (!) 57   Temp 97 F (36.1 C)   Resp 16   Ht 5' 10"  (1.778 m)   Wt 92.1 kg (203 lb)   SpO2 100%   BMI 29.13 kg/m   HEMODYNAMICS:    VENTILATOR SETTINGS: Vent Mode: AC FiO2 (%):  [35 %-50 %] 35 % Set Rate:  [16 bmp] 16 bmp Vt Set:  [500 mL] 500 mL PEEP:  [5 cmH20] 5 cmH20  INTAKE / OUTPUT: No intake/output data recorded.  PHYSICAL EXAMINATION: General:  Acutely ill appearing Caucasian male, intubated and sedated lying in bed Neuro:  Sedated, withdraws from pain, no following commands,  PERRL HEENT:  Atraumatic, normocephalic, neck supple, no JVD  Cardiovascular:  NSR on telemetry, RRR, s1s2, no M/R/G Lungs: Coarse throughout, symmetric expansion, no accessory muscle use, non labored Abdomen:  Active BS x4, soft, non-tender, non distended  Musculoskeletal:  Normal bulk and tone, no edema noted Skin:  Scattered abrasion and ecchymosis to upper and lower extremities, no rashes or lesions apparent   LABS:  BMET  Recent Labs Lab 07/16/16 1103  NA 134*  K 3.9  CL 89*  CO2 40*  BUN 13  CREATININE 0.88  GLUCOSE 102*    Electrolytes  Recent Labs Lab 07/16/16 1103  CALCIUM 8.7*    CBC  Recent Labs Lab 07/16/16 1103  WBC 8.1  HGB 10.5*  HCT 31.0*  PLT 121*    Coag's No results for input(s): APTT, INR in the last 168 hours.  Sepsis Markers No results for input(s): LATICACIDVEN, PROCALCITON, O2SATVEN in the last 168 hours.  ABG  Recent Labs Lab 07/16/16 1200 07/16/16 1508  PHART 7.35 7.41  PCO2ART 88* 61*  PO2ART 138* 179*    Liver Enzymes  Recent Labs Lab 07/16/16 1103  AST 20  ALT 11*  ALKPHOS 60  BILITOT 1.5*  ALBUMIN 3.9    Cardiac Enzymes  Recent Labs Lab 07/16/16 1103  TROPONINI <0.03    Glucose No results for input(s): GLUCAP in the last 168 hours.  Imaging Dg Chest 2 View  Result Date: 07/16/2016 CLINICAL DATA:  Pain EXAM: CHEST  2 VIEW COMPARISON:  04/16/2016 FINDINGS: Elevation of the right hemidiaphragm with gas distended colon between the right diaphragm and liver. Cardiomegaly with vascular congestion. Bibasilar atelectasis. No overt edema or effusions. No acute bony abnormality. IMPRESSION: Elevated right hemidiaphragm.  Bibasilar atelectasis. Cardiomegaly, vascular congestion. Electronically Signed   By: Rolm Baptise M.D.   On: 07/16/2016 11:30   Ct Head Wo Contrast  Result Date: 07/16/2016 CLINICAL DATA:  71 year old male with a history of confusion EXAM: CT HEAD WITHOUT CONTRAST TECHNIQUE: Contiguous  axial images were obtained from the base of the skull through the vertex without intravenous contrast. COMPARISON:  03/14/2016 FINDINGS: Brain: No acute intracranial hemorrhage. No midline shift or mass effect. Streak artifact from the metallic hardware at the posterior skullbase limits evaluation the posterior fossa. Gray-white differentiation relatively maintained. Hypodensity in the right frontal lobe is unchanged from the comparison CT. Punctate mineralization/calcification of the right frontal, right temporal, right parietal, right occipital region, unchanged from prior. Vascular: Calcifications of the anterior and posterior circulation. Skull: No displaced fracture.  No aggressive bony lesion. Sinuses/Orbits: No significant paranasal sinus disease. Other: Surgical changes at the occiput. IMPRESSION: No CT evidence of acute intracranial abnormality. Intracranial atherosclerosis. Re- demonstration of hypodensity in the right frontal lobe, suggesting prior infarction or demyelination. Signed, Dulcy Fanny. Earleen Newport, DO [facility] Radiology Electronically Signed   By: Corrie Mckusick D.O.   On: 07/16/2016 11:46   Dg Chest Portable 1 View  Result Date: 07/16/2016 CLINICAL DATA:  Status post intubation EXAM: PORTABLE CHEST 1 VIEW COMPARISON:  07/16/2016 FINDINGS: Endotracheal tube is now seen 2.5 cm above the carina. The cardiac shadow is within normal limits. Postsurgical changes in the cervicothoracic spine are seen. No focal infiltrate or sizable effusion is noted. IMPRESSION: Status post intubation.  No acute abnormality noted. Electronically Signed   By: Inez Catalina M.D.   On: 07/16/2016 12:50     STUDIES:  1/26 CT Head >> No CT evidence of acute intracranial abnormality. Intracranial atherosclerosis. Re- demonstration of hypodensity in the right frontal lobe, suggesting prior infarction or demyelination. 1/26 CXR>>Elevated right hemidiaphragm.  Bibasilar  atelectasis. Cardiomegaly, vascular congestion.   CULTURES: 1/26 Blood Cultures x2 >>  ANTIBIOTICS: 1/26 Meropenem>> 1/26 Vancomycin>>  SIGNIFICANT EVENTS: 1/26-Pt admitted to [facility] ICU due to acute on chronic hypercapnic respiratory failure secondary to AECOPD and ?Pneumonia requiring mechanical intubation PCCM contacted for management  LINES/TUBES: 1/26 ETT >> 1/26 Foley >>  ASSESSMENT / PLAN:  PULMONARY A: Acute on Chronic Hypercapnic Respiratory failure secondary to AECOPD and ?Pneumonia  Hx: Former smoker and home O2 use P:   Full vent support  Maintain O2 sats 88-92% SBT when parameters met Prn Bronchodilators VAP bundle  CXR and ABG in am   CARDIOVASCULAR A:  Hypotension likely secondary to sedating medication>>Resolving Hx: HTN, Peripheral neuropathy, and Carotid artery occlusion P:  Continuous telemetry monitoring Maintain map >65 Hold torsemide for now  Obtain BNP Echo pending   RENAL A:   No acute issues P:   Trend BMP's Replace electrolytes as indicated Monitor UOP  GASTROINTESTINAL A:   No acute issues P:   Initiate tube feedings  Pepcid  for PUD Prophylaxis  HEMATOLOGIC A:   Anemia Thrombocytopenia P:  Trend CBC's Lovenox for VTE prophylaxis  Monitor for s/sx of bleeding Transfuse for hgb of <7  INFECTIOUS A:   ?Pneumonia P:   Trend WBC's and monitor fever curve Trend PCT's  Continue abx as listed above Follow cultures   ENDOCRINE A:   No acute issues  P:  Follow serum glucose  Hypo/Hyperglycemia Protocol  NEUROLOGIC A:   Acute encephalopathy secondary to hypercapnia Hx: Depression P:   Maintain RASS goal: 0 to -1 Fentanyl gtt, prn fentanyl and versed to maintain RASS goal WUA daily Lights on during the day  Frequent reorientation Family presence promoted at bedside  FAMILY  - Updates: No family currently available to update  - Inter-disciplinary family meet or Palliative Care meeting due by:  07/23/16  Marda Stalker, [place] Pager [phone] (please enter 7 digits) PCCM Consult Pager [phone] (please enter 7 digits)

## 2016-07-16 NOTE — ED Notes (Signed)
Anesthesiologist at bedside to assist with difficult airway.  Fentanyl 50 mcg, ketamine 150mg  and succ 100mg  given.  7.5 tube , 24@ the lip.

## 2016-07-16 NOTE — ED Triage Notes (Signed)
pts daughter could not get in touch with him so she sent the police to his home.  Police called ems, pt was confused and had fallen.  Pt alert, oriented x 2.  States he fell but unable to recall details.  Denies pain.

## 2016-07-16 NOTE — ED Provider Notes (Signed)
[facility] Emergency Department Provider Note    First MD Initiated Contact with Patient 07/16/16 1050     (approximate)  I have reviewed the triage vital signs and the nursing notes.   HISTORY  Chief Complaint Altered Mental Status  Level V caveat:  AMS/respiratory failure  HPI Illene LabradorDanny L Shutes is a 71 y.o. male patient arrives via EMS of the dye could Huffman get a hold of hip overnight. Last known normal was yesterday. Patient found down in his home. Reportedly confused and had reportedly fallen. Initially upon arrival to the ER the patient is a little drowsy is alert and oriented to person and place but Huffman time. Unable to provide much history on fall. States he does have a history of COPD. Does have a history of chronic respiratory failure requiring tracheostomy which was recently D cannulated.   Past Medical History:  Diagnosis Date  . Antalgic gait   . Carotid artery occlusion   . Chronic neck and back pain   . Complication of anesthesia    pt has had cervical fusion-limited neck flextion  . COPD (chronic obstructive pulmonary disease) (HCC)   . Depression   . DJD (degenerative joint disease)    Right shoulder  . Hypertension   . Lumbago   . Peripheral neuropathy (HCC)    Family History  Problem Relation Age of Onset  . Stomach cancer Mother   . Hypertension Brother   . Heart attack Father    Past Surgical History:  Procedure Laterality Date  . ABDOMINAL AORTIC ANEURYSM REPAIR  2009   Endovascular AAA repair  . ABDOMINAL AORTIC ANEURYSM REPAIR    . CARPAL TUNNEL RELEASE     bilateral CTS  . HAMMER TOE SURGERY  12/10/2011   Procedure: HAMMER TOE CORRECTION;  Surgeon: Ernestene KielMax Hyatt, DPM;  Location: [facility];  Service: Podiatry;  Laterality: Right;  Right hammertoe repair second right with 2.5x42 orthopro screw   . IR GENERIC HISTORICAL  04/05/2016   IR GASTROSTOMY TUBE MOD SED 04/05/2016 [facility]  . IR GENERIC HISTORICAL   05/12/2016   IR CM INJ ANY COLONIC TUBE W/FLUORO 05/12/2016 Malachy MoanHeath McCullough, MD [facility]  . IR GENERIC HISTORICAL  05/26/2016   IR CM INJ ANY COLONIC TUBE W/FLUORO 05/26/2016 [facility]  . IR GENERIC HISTORICAL  05/28/2016   IR REPLC GASTRO/COLONIC TUBE PERCUT W/FLUORO 05/28/2016 Brayton ElKevin Bruning, PA-C [facility]  . IR GENERIC HISTORICAL  07/02/2016   IR GASTROSTOMY TUBE REMOVAL 07/02/2016 Brayton ElKevin Bruning, PA-C [facility]  . METATARSAL OSTEOTOMY  12/10/2011   Procedure: METATARSAL OSTEOTOMY;  Surgeon: Ernestene KielMax Hyatt, DPM;  Location: [facility];  Service: Podiatry;  Laterality: Right;  Right Keller arthroplasty with size 5 silicone implant right great toe; second metatarsal osteotomy with 2.0x14 screw  . NECK SURGERY    . PERIPHERAL VASCULAR CATHETERIZATION N/A 02/11/2015   Procedure: Carotid Angiography;  Surgeon: Nada LibmanVance W Brabham, MD;  [facility];  Service: Cardiovascular;  Laterality: N/A;  . PILONIDAL CYST / SINUS EXCISION    . Rotary cuff    . SPINE SURGERY  09/15/11   Scar tissue removed- back  . SPINE SURGERY  09/15/11   Scar tissue removed- back  . TONSILLECTOMY    . TRACHEOSTOMY TUBE PLACEMENT N/A 03/03/2016   Procedure: TRACHEOSTOMY;  Surgeon: Geanie LoganPaul Bennett, MD;  Location: [facility] ORS;  Service: ENT;  Laterality: N/A;   Patient Active Problem List   Diagnosis Date Noted  .  Respiratory failure (HCC) 07/16/2016  . 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 Dysphagia, pharyngoesophageal phase 05/18/2016  . GERD without esophagitis 05/09/2016  . Bilateral lower extremity edema 05/09/2016  . Slow transit constipation 05/09/2016  . Depression with anxiety 05/09/2016  . Respiratory failure with hypoxia and hypercapnia (HCC) 07/22/2015  . Cervical vertebral fusion   . Depression   . Chronic neck and back pain   . Acute on chronic respiratory failure with hypercapnia (HCC)   . COPD exacerbation (HCC)   . HCAP  (healthcare-associated pneumonia)   . Congestive dilated cardiomyopathy (HCC)   . Syncope and collapse   . Arthritis of right hip 08/04/2014  . Essential hypertension 08/04/2014  . Hypothyroidism 08/04/2014  . Syncope 08/04/2014  . Fall 08/04/2014      Prior to Admission medications   Medication Sig Start Date End Date Taking? Authorizing Provider  budesonide-formoterol (SYMBICORT) 160-4.5 MCG/ACT inhaler Inhale 2 puffs into the lungs 2 (two) times daily.   Yes Historical Provider, MD  predniSONE (DELTASONE) 5 MG tablet Place 5 mg into feeding tube daily.    Yes Historical Provider, MD  budesonide (PULMICORT) 0.5 MG/2ML nebulizer solution Take 0.5 mg by nebulization 2 (two) times daily.    Historical Provider, MD  clonazePAM (KLONOPIN) 0.5 MG tablet Place 0.5 mg into feeding tube 2 (two) times daily.     Historical Provider, MD  docusate sodium (COLACE) 100 MG capsule Take 100 mg by mouth 2 (two) times daily.     Historical Provider, MD  enoxaparin (LOVENOX) 40 MG/0.4ML injection Inject 40 mg into the skin daily.    Historical Provider, MD  famotidine (PEPCID) 20 MG tablet Place 20 mg into feeding tube 2 (two) times daily.     Historical Provider, MD  folic acid (FOLVITE) 1 MG tablet Place 1 mg into feeding tube daily.     Historical Provider, MD  ipratropium-albuterol (DUONEB) 0.5-2.5 (3) MG/3ML SOLN Take 3 mLs by nebulization 3 (three) times daily.    Historical Provider, MD  metoCLOPramide (REGLAN) 5 MG tablet Place 2.5 mg into feeding tube every 6 (six) hours.    Historical Provider, MD  Multiple Vitamin (MULTIVITAMIN WITH MINERALS) TABS tablet Take 1 tablet by mouth daily.    Historical Provider, MD  Nutritional Supplements (FEEDING SUPPLEMENT, GLUCERNA 1.5 CAL,) LIQD Place 60 mL/hr into feeding tube continuous.    Historical Provider, MD  Nutritional Supplements (NUTRITIONAL DRINK) LIQD Take 1 Bottle by mouth 2 (two) times daily. For Prophylaxis    Historical Provider, MD  oxyCODONE  (OXY IR/ROXICODONE) 5 MG immediate release tablet Place 5 mg into feeding tube every 8 (eight) hours.     Historical Provider, MD  OXYGEN 4 L/min by Intratracheal route as needed (for shortness of breath).     Historical Provider, MD  QUEtiapine (SEROQUEL) 50 MG tablet Place 100 mg into feeding tube at bedtime.     Historical Provider, MD  sertraline (ZOLOFT) 50 MG tablet Place 75 mg into feeding tube daily.     Historical Provider, MD  torsemide (DEMADEX) 20 MG tablet Place 20 mg into feeding tube daily.     Historical Provider, MD    Allergies Ampicillin; Nortriptyline; Brimonidine; Cyclobenzaprine; Tizanidine; and Gabapentin    Social History Social History  Substance Use Topics  . Smoking status: Former Smoker    Quit date: 08/30/2006  . Smokeless tobacco: Never Used  . Alcohol use 1.2 oz/week    2 Cans of beer  per week     Comment: occasional use    Review of Systems Patient denies headaches, rhinorrhea, blurry vision, numbness, shortness of breath, chest pain, edema, cough, abdominal pain, nausea, vomiting, diarrhea, dysuria, fevers, rashes or hallucinations unless otherwise stated above in HPI. ____________________________________________   PHYSICAL EXAM:  VITAL SIGNS: Vitals:   07/16/16 1327 07/16/16 1339  BP: 93/68 (!) 88/63  Pulse: (!) 57 (!) 57  Resp: 17 16  Temp: (!) 96.7 F (35.9 C) 97 F (36.1 C)    Constitutional: Alert  ill appearing and in acute respiratory distressdistress. Eyes: Conjunctivae are normal. PERRL. EOMI. Head: Atraumatic. Nose: No congestion/rhinnorhea. Mouth/Throat: Mucous membranes are moist.  Oropharynx non-erythematous. Neck: No stridor. tracheostomy revision appearing c/d/i. Diminished ROM of neck 2/2  No cervical spine tenderness to  Hematological/Lymphatic/Immunilogical: No cervical lymphadenopathy. Cardiovascular: Normal rate, regular rhythm. Grossly normal heart sounds.  Good peripheral circulation. Respiratory:  Diminished  breathsounds throughout, fiant wheezing Gastrointestinal: Soft and nontender. No distention. No abdominal bruits. No CVA tenderness. Genitourinary:  Musculoskeletal: No lower extremity tenderness nor edema.  No joint effusions. Neurologic:  Normal speech and language. No gross focal neurologic deficits are appreciated. No gait instability. Skin:  Skin is warm, dry and intact. No rash noted. Psychiatric: Mood and affect are normal. Speech and behavior are normal.  ____________________________________________   LABS (all labs ordered are listed, but only abnormal results are displayed)  Results for orders placed or performed during the hospital encounter of 07/16/16 (from the past 24 hour(s))  CBC with Differential     Status: Abnormal   Collection Time: 07/16/16 11:03 AM  Result Value Ref Range   WBC 8.1 3.8 - 10.6 K/uL   RBC 3.44 (L) 4.40 - 5.90 MIL/uL   Hemoglobin 10.5 (L) 13.0 - 18.0 g/dL   HCT 16.1 (L) 09.6 - 04.5 %   MCV 90.0 80.0 - 100.0 fL   MCH 30.6 26.0 - 34.0 pg   MCHC 33.9 32.0 - 36.0 g/dL   RDW 40.9 (H) 81.1 - 91.4 %   Platelets 121 (L) 150 - 440 K/uL   Neutrophils Relative % 79 %   Neutro Abs 6.4 1.4 - 6.5 K/uL   Lymphocytes Relative 9 %   Lymphs Abs 0.7 (L) 1.0 - 3.6 K/uL   Monocytes Relative 11 %   Monocytes Absolute 0.9 0.2 - 1.0 K/uL   Eosinophils Relative 1 %   Eosinophils Absolute 0.0 0 - 0.7 K/uL   Basophils Relative 0 %   Basophils Absolute 0.0 0 - 0.1 K/uL  Comprehensive metabolic panel     Status: Abnormal   Collection Time: 07/16/16 11:03 AM  Result Value Ref Range   Sodium 134 (L) 135 - 145 mmol/L   Potassium 3.9 3.5 - 5.1 mmol/L   Chloride 89 (L) 101 - 111 mmol/L   CO2 40 (H) 22 - 32 mmol/L   Glucose, Bld 102 (H) 65 - 99 mg/dL   BUN 13 6 - 20 mg/dL   Creatinine, Ser 7.82 0.61 - 1.24 mg/dL   Calcium 8.7 (L) 8.9 - 10.3 mg/dL   Total Protein 6.6 6.5 - 8.1 g/dL   Albumin 3.9 3.5 - 5.0 g/dL   AST 20 15 - 41 U/L   ALT 11 (L) 17 - 63 U/L   Alkaline  Phosphatase 60 38 - 126 U/L   Total Bilirubin 1.5 (H) 0.3 - 1.2 mg/dL   GFR calc non Af Amer >60 >60 mL/min   GFR calc Af Amer >60 >60  mL/min   Anion gap 5 5 - 15  Troponin I     Status: None   Collection Time: 07/16/16 11:03 AM  Result Value Ref Range   Troponin I <0.03 <0.03 ng/mL  Blood gas, arterial     Status: Abnormal   Collection Time: 07/16/16 12:00 PM  Result Value Ref Range   FIO2 0.28    pH, Arterial 7.35 7.350 - 7.450   pCO2 arterial 88 (HH) 32.0 - 48.0 mmHg   pO2, Arterial 138 (H) 83.0 - 108.0 mmHg   Bicarbonate 48.6 (H) 20.0 - 28.0 mmol/L   Acid-Base Excess 19.2 (H) 0.0 - 2.0 mmol/L   O2 Saturation 99.0 %   Patient temperature 37.0    Collection site RIGHT RADIAL    Drawn by RIGHT RADIAL    Sample type ARTERIAL DRAW    Allens test (pass/fail) PASS PASS  Blood gas, arterial     Status: Abnormal   Collection Time: 07/16/16  3:08 PM  Result Value Ref Range   FIO2 0.50    Delivery systems VENTILATOR    Mode ASSIST CONTROL    VT 500 mL   LHR 15 resp/min   Peep/cpap 5.0 cm H20   pH, Arterial 7.41 7.350 - 7.450   pCO2 arterial 61 (H) 32.0 - 48.0 mmHg   pO2, Arterial 179 (H) 83.0 - 108.0 mmHg   Bicarbonate 38.7 (H) 20.0 - 28.0 mmol/L   Acid-Base Excess 12.3 (H) 0.0 - 2.0 mmol/L   O2 Saturation 99.6 %   Patient temperature 37.0    Collection site REVIEWED BY    Sample type ARTERIAL DRAW    Allens test (pass/fail) PASS PASS   ____________________________________________  EKG My review and personal interpretation at Time: 12:26   Indication: sob  Rate: 80  Rhythm: sinus Axis: normal Other: occasional PVC ____________________________________________  RADIOLOGY  I personally reviewed all radiographic images ordered to evaluate for the above acute complaints and reviewed radiology reports and findings.  These findings were personally discussed with the patient.  Please see medical record for radiology  report.  ____________________________________________   PROCEDURES  Procedure(s) performed:  Procedures    Critical Care performed: CRITICAL CARE Performed by: Willy Eddy   Total critical care time: 55 minutes  Critical care time was exclusive of separately billable procedures and treating other patients.  Critical care was necessary to treat or prevent imminent or life-threatening deterioration.  Critical care was time spent personally by me on the following activities: development of treatment plan with patient and/or surrogate as well as nursing, discussions with consultants, evaluation of patient's response to treatment, examination of patient, obtaining history from patient or surrogate, ordering and performing treatments and interventions, ordering and review of laboratory studies, ordering and review of radiographic studies, pulse oximetry and re-evaluation of patient's condition. no ____________________________________________   INITIAL IMPRESSION / ASSESSMENT AND PLAN / ED COURSE  Pertinent labs & imaging results that were available during my care of the patient were reviewed by me and considered in my medical decision making (see chart for details).  DDX: Dehydration, sepsis, pna, uti, hypoglycemia, cva, drug effect, withdrawal, encephalitis   KASPER MUDRICK is a 71 y.o. who presents to the ED with altered mental status and evidence of acute hypercapnic respiratory failure. Patient has been down. Based on the acuity of his illness blood work is obtained. He has no radiographic evidence of acute traumatic injury. No significant evidence of infection at this time. However while evaluated in the patient  he had a steady decline in his mental status concerning for worsening hypercapnia. There is no evidence of seizure-like activity. At one point the patient was very difficult to arouse with painful stimuli. DVT should show evidence of compensated hypercapnic respiratory  failure. Based on his altered mental status without to intubate the patient. Anesthesia was called down to bedside due to concern for difficult airway.  Patient was placed on ventilator. PreserVision antibiotics were started due to concern for pneumonia. Patient had brief episode of hypotension after being intubated which responded to IV fluids. The patient was admitted to the ICU.  Clinical Course as of Jul 16 1552  Fri Jul 16, 2016  1323 Neutrophils: 56 [PR]    Clinical Course User Index [PR] Willy Eddy, MD     ____________________________________________   FINAL CLINICAL IMPRESSION(S) / ED DIAGNOSES  Final diagnoses:  Acute respiratory failure with hypercapnia (HCC)  Acute encephalopathy      NEW MEDICATIONS STARTED DURING THIS VISIT:  New Prescriptions   No medications on file     Note:  This document was prepared using Dragon voice recognition software and may include unintentional dictation errors.    Willy Eddy, MD 07/16/16 [phone]

## 2016-07-16 NOTE — Progress Notes (Signed)
[facility] rounding the unit was asked to visit the Pt. Pt has fallen but was communicative. Pt was intubated, but daughter could not be reached by phone.     07/16/16 1500  Clinical Encounter Type  Visited With Patient  Visit Type Initial  Referral From Nurse  Consult/Referral To Chaplain  Spiritual Encounters  Spiritual Needs Prayer;Other (Comment)

## 2016-07-16 NOTE — ED Notes (Signed)
Pt becoming more difficult to arouse.  Dr Roxan Hockeyobinson at bedside to intubate.

## 2016-07-17 ENCOUNTER — Inpatient Hospital Stay: Payer: Medicare Other

## 2016-07-17 ENCOUNTER — Inpatient Hospital Stay
Admit: 2016-07-17 | Discharge: 2016-07-17 | Disposition: A | Discharge status: Home / Self Care | Payer: Medicare Other | Attending: Critical Care Medicine | Admitting: Critical Care Medicine

## 2016-07-17 LAB — BLOOD GAS, ARTERIAL
ACID-BASE EXCESS: 12.2 mmol/L — AB (ref 0.0–2.0)
BICARBONATE: 39.9 mmol/L — AB (ref 20.0–28.0)
FIO2: 0.35
MECHANICAL RATE: 16
MECHVT: 500 mL
O2 Saturation: 92.1 %
PATIENT TEMPERATURE: 37
PCO2 ART: 69 mmHg — AB (ref 32.0–48.0)
PEEP: 5 cmH2O
pH, Arterial: 7.37 (ref 7.350–7.450)
pO2, Arterial: 66 mmHg — ABNORMAL LOW (ref 83.0–108.0)

## 2016-07-17 LAB — BASIC METABOLIC PANEL
Anion gap: 3 — ABNORMAL LOW (ref 5–15)
BUN: 13 mg/dL (ref 6–20)
CHLORIDE: 97 mmol/L — AB (ref 101–111)
CO2: 35 mmol/L — ABNORMAL HIGH (ref 22–32)
Calcium: 8.2 mg/dL — ABNORMAL LOW (ref 8.9–10.3)
Creatinine, Ser: 0.78 mg/dL (ref 0.61–1.24)
GFR calc Af Amer: >60 mL/min (ref >60)
GFR calc non Af Amer: >60 mL/min (ref >60)
GLUCOSE: 112 mg/dL — AB (ref 65–99)
Potassium: 3.8 mmol/L (ref 3.5–5.1)
Sodium: 135 mmol/L (ref 135–145)

## 2016-07-17 LAB — CBC
HCT: 30.5 % — ABNORMAL LOW (ref 40.0–52.0)
HEMOGLOBIN: 10.2 g/dL — AB (ref 13.0–18.0)
MCH: 30.2 pg (ref 26.0–34.0)
MCHC: 33.5 g/dL (ref 32.0–36.0)
MCV: 90 fL (ref 80.0–100.0)
Platelets: 126 10*3/uL — ABNORMAL LOW (ref 150–440)
RBC: 3.39 MIL/uL — AB (ref 4.40–5.90)
RDW: 16.5 % — ABNORMAL HIGH (ref 11.5–14.5)
WBC: 10.9 10*3/uL — ABNORMAL HIGH (ref 3.8–10.6)

## 2016-07-17 LAB — PHOSPHORUS
Phosphorus: 3.2 mg/dL (ref 2.5–4.6)
Phosphorus: 4 mg/dL (ref 2.5–4.6)

## 2016-07-17 LAB — PROCALCITONIN

## 2016-07-17 LAB — GLUCOSE, CAPILLARY
GLUCOSE-CAPILLARY: 149 mg/dL — AB (ref 65–99)
GLUCOSE-CAPILLARY: 115 mg/dL — AB (ref 65–99)
GLUCOSE-CAPILLARY: 105 mg/dL — AB (ref 65–99)
Glucose-Capillary: 111 mg/dL — ABNORMAL HIGH (ref 65–99)
Glucose-Capillary: 94 mg/dL (ref 65–99)

## 2016-07-17 LAB — MAGNESIUM
Magnesium: 1.8 mg/dL (ref 1.7–2.4)
Magnesium: 1.9 mg/dL (ref 1.7–2.4)

## 2016-07-17 MED ORDER — VITAL HIGH PROTEIN PO LIQD
1000.0000 mL | ORAL | Status: DC
  Administered 2016-07-17 – 2016-07-20 (×4): 1000 mL

## 2016-07-17 MED ORDER — DEXMEDETOMIDINE HCL IN NACL 400 MCG/100ML IV SOLN
0.4000 ug/kg/h | INTRAVENOUS | Status: DC
  Administered 2016-07-17: 0.5 ug/kg/h via INTRAVENOUS
  Administered 2016-07-18 – 2016-07-19 (×8): 1 ug/kg/h via INTRAVENOUS
  Filled 2016-07-17 (×10): qty 100

## 2016-07-17 MED ORDER — ORAL CARE MOUTH RINSE
15.0000 mL | OROMUCOSAL | Status: DC
  Administered 2016-07-17 – 2016-07-21 (×37): 15 mL via OROMUCOSAL

## 2016-07-17 MED ORDER — CHLORHEXIDINE GLUCONATE 0.12% ORAL RINSE (MEDLINE KIT)
15.0000 mL | Freq: Two times a day (BID) | OROMUCOSAL | Status: DC
  Administered 2016-07-17 – 2016-07-21 (×8): 15 mL via OROMUCOSAL

## 2016-07-17 MED ORDER — PRO-STAT SUGAR FREE PO LIQD
30.0000 mL | Freq: Four times a day (QID) | ORAL | Status: DC
  Administered 2016-07-17 – 2016-07-20 (×13): 30 mL

## 2016-07-17 MED ORDER — MIDAZOLAM HCL 2 MG/2ML IJ SOLN
2.0000 mg | INTRAMUSCULAR | Status: DC | PRN
  Administered 2016-07-18 (×3): 4 mg via INTRAVENOUS
  Administered 2016-07-18: 2 mg via INTRAVENOUS
  Administered 2016-07-18 – 2016-07-19 (×8): 4 mg via INTRAVENOUS
  Filled 2016-07-17 (×12): qty 4

## 2016-07-17 MED ORDER — MIDAZOLAM HCL 2 MG/2ML IJ SOLN
2.0000 mg | INTRAMUSCULAR | Status: DC | PRN
  Administered 2016-07-17 (×10): 2 mg via INTRAVENOUS
  Filled 2016-07-17 (×10): qty 2

## 2016-07-17 NOTE — Progress Notes (Signed)
Initial Nutrition Assessment  DOCUMENTATION CODES:   Obesity unspecified  INTERVENTION:  -TF: recommend increasing Vital High Protein to goal of 45 ml/hr and increasing Prostat 30 mL to QID; provides 1480 kcals, 155 g and 907 mL of free water. Continue to assess  NUTRITION DIAGNOSIS:   Inadequate oral intake related to acute illness as evidenced by NPO status.  GOAL:   Provide needs based on ASPEN/SCCM guidelines  MONITOR:   TF tolerance, Labs, Weight trends, Vent status  REASON FOR ASSESSMENT:   Ventilator, Consult Enteral/tube feeding initiation and management  ASSESSMENT:   70 yo male admitted with acute on chronic respiratory failure 100secondary to AECOPD and possible pneumonia. Pt with hx of COPD, chronic pain, HTN; and hx of previous trach/PEG  Pt currently sedated on vent  NG tube in place; abdomen soft/obese Tolerating Vital High Protein at rate of 40 ml/hr, Prostat BID  Labs: reviewed Meds:fentanyl/versed  Diet Order:   NPO  Skin:  Reviewed, no issues  Last BM:  no documented BM  Height:   Ht Readings from Last 1 Encounters:  07/16/16 5\' 10"  (1.778 m)    Weight:   Wt Readings from Last 1 Encounters:  07/17/16 215 lb 2.7 oz (97.6 kg)   Filed Weights   07/16/16 1058 07/17/16 0426  Weight: 203 lb (92.1 kg) 215 lb 2.7 oz (97.6 kg)    BMI:  Body mass index is 30.87 kg/m.  Estimated Nutritional Needs:   Kcal:  [phone] kcals  Protein:  >/= 151 g  Fluid:  >/= 1.8 L  EDUCATION NEEDS:   No education needs identified at this time  Chad StarcherCate Anabeth Chilcott MS, RD, LDN [phone] Pager  [phone] Weekend/On-Call Pager

## 2016-07-17 NOTE — Progress Notes (Signed)
*  PRELIMINARY RESULTS* Echocardiogram 2D Echocardiogram has been performed.  Weston Kallman S Trianna Lupien 07/17/2016, 2:38 PM 

## 2016-07-18 DIAGNOSIS — J988 Other specified respiratory disorders: Secondary | ICD-10-CM

## 2016-07-18 LAB — CBC
HCT: 26.2 % — ABNORMAL LOW (ref 40.0–52.0)
Hemoglobin: 8.7 g/dL — ABNORMAL LOW (ref 13.0–18.0)
MCH: 29.9 pg (ref 26.0–34.0)
MCHC: 33.2 g/dL (ref 32.0–36.0)
MCV: 90.1 fL (ref 80.0–100.0)
PLATELETS: 109 10*3/uL — AB (ref 150–440)
RBC: 2.91 MIL/uL — AB (ref 4.40–5.90)
RDW: 17.3 % — AB (ref 11.5–14.5)
WBC: 6.1 10*3/uL (ref 3.8–10.6)

## 2016-07-18 LAB — BASIC METABOLIC PANEL
ANION GAP: 4 — AB (ref 5–15)
BUN: 19 mg/dL (ref 6–20)
CALCIUM: 8.1 mg/dL — AB (ref 8.9–10.3)
CO2: 34 mmol/L — ABNORMAL HIGH (ref 22–32)
Chloride: 98 mmol/L — ABNORMAL LOW (ref 101–111)
Creatinine, Ser: 0.89 mg/dL (ref 0.61–1.24)
Glucose, Bld: 121 mg/dL — ABNORMAL HIGH (ref 65–99)
POTASSIUM: 3.8 mmol/L (ref 3.5–5.1)
SODIUM: 136 mmol/L (ref 135–145)

## 2016-07-18 LAB — GLUCOSE, CAPILLARY
GLUCOSE-CAPILLARY: 118 mg/dL — AB (ref 65–99)
GLUCOSE-CAPILLARY: 120 mg/dL — AB (ref 65–99)
GLUCOSE-CAPILLARY: 124 mg/dL — AB (ref 65–99)
GLUCOSE-CAPILLARY: 128 mg/dL — AB (ref 65–99)
GLUCOSE-CAPILLARY: 133 mg/dL — AB (ref 65–99)
Glucose-Capillary: 115 mg/dL — ABNORMAL HIGH (ref 65–99)
Glucose-Capillary: 121 mg/dL — ABNORMAL HIGH (ref 65–99)

## 2016-07-18 LAB — MAGNESIUM: Magnesium: 1.9 mg/dL (ref 1.7–2.4)

## 2016-07-18 LAB — PROCALCITONIN

## 2016-07-18 LAB — ECHOCARDIOGRAM COMPLETE
HEIGHTINCHES: 70 in
Weight: 3442.7 oz

## 2016-07-18 LAB — PHOSPHORUS: PHOSPHORUS: 3.2 mg/dL (ref 2.5–4.6)

## 2016-07-18 LAB — VANCOMYCIN, TROUGH: VANCOMYCIN TR: 11 ug/mL — AB (ref 15–20)

## 2016-07-18 MED ORDER — SIMETHICONE 80 MG PO CHEW: 40.0000 mg | CHEWABLE_TABLET | Freq: Once | ORAL | Status: DC

## 2016-07-18 MED ORDER — LACTULOSE 10 GM/15ML PO SOLN: 30.0000 g | Freq: Once | ORAL | Status: DC

## 2016-07-18 NOTE — H&P (Signed)
PULMONARY / CRITICAL CARE MEDICINE   Name: Chad Huffman MRN: [id number] DOB: 1946/05/19    ADMISSION DATE:  07/16/2016 CONSULTATION DATE:  07/16/16  REFERRING MD:  Dr. Merlyn Lot  CHIEF COMPLAINT:  Altered Mental Status and Acute Hypercapnic Respiratory Failure  HISTORY OF PRESENT ILLNESS:   Chad Huffman is a 71 y.o. male with a PMH of Peripheral Neuropathy, Lumbago, Hypertension, Former smoker, DJD, Depression, COPD (required tracheostomy that was recently decannulated 2 weeks prior to presentation to ER), Chronic neck and back pain, Cervical fusion-limited neck flexion secondary complication of anesthesia, Carotid artery occlusion, and Antalgic gait.  Per ER notes pts daughter stated she was unable to get in contact with the patient overnight 1/25, he was at baseline during the day on 01/25.  Police and EMS were called 1/26 to check on the patient, upon EMS arrival he was found on the floor following a fall.  He was alert, but confused to time and situation.  In the ER CT Head negative for any acute abnormalities, however the pt had a steady decline in his mental status concerning for hypercapnic respiratory failure secondary to AECOPD and ?Pneumonia requiring mechanical intubation.  PCCM contacted 01/26 to admit the pt to ICU.    REVIEW OF SYSTEMS:   Unable to access due to intubation and sedation  SUBJECTIVE:  Unable to wean from vent due to unstable airway in setting of COPD 1/27 unable to wean, h/o difficult airway, needs trach to survive Awaiting for brother of patient to make decision Remains on full vent support  VITAL SIGNS: BP (!) 145/73   Pulse 63   Temp 99 F (37.2 C)   Resp 16   Ht _0  (1.778 m)   Wt 228 lb 6.3 oz (103.6 kg)   SpO2 92%   BMI 32.77 kg/m   HEMODYNAMICS:    VENTILATOR SETTINGS: Vent Mode: PRVC FiO2 (%):  [35 %] 35 % Set Rate:  [16 bmp-25 bmp] 16 bmp Vt Set:  [500 mL] 500 mL PEEP:  [5 cmH20] 5 cmH20  INTAKE / OUTPUT: I/O last 3 completed  shifts: In: 2881.5 [I.V.:1032.8; ML/YY:5035.4; IV Piggyback:200] Out: 1110 [Urine:1110]  PHYSICAL EXAMINATION: General:  Acutely ill appearing Caucasian male, intubated and sedated lying in bed Neuro:  Sedated, withdraws from pain, no following commands, PERRL gcs<8T HEENT:  Atraumatic, normocephalic, neck supple, no JVD  Cardiovascular:  NSR on telemetry, RRR, s1s2, no M/R/G Lungs: Coarse throughout, symmetric expansion, no accessory muscle use, non labored Abdomen:  Active BS x4, soft, non-tender, non distended  Musculoskeletal:  Normal bulk and tone, no edema noted Skin:  Scattered abrasion and ecchymosis to upper and lower extremities, no rashes or lesions apparent   LABS:  BMET  Recent Labs Lab 07/16/16 1103 07/17/16 0453 07/18/16 0500  NA 134* 135 136  K 3.9 3.8 3.8  CL 89* 97* 98*  CO2 40* 35* 34*  BUN _1 CREATININE 0.88 0.78 0.89  GLUCOSE 102* 112* 121*    Electrolytes  Recent Labs Lab 07/16/16 1103 07/17/16 0453 07/17/16 1702 07/18/16 0500  CALCIUM 8.7* 8.2*  --  8.1*  MG 1.8 1.8 1.9 1.9  PHOS 2.8 3.2 4.0 3.2    CBC  Recent Labs Lab 07/16/16 1103 07/17/16 0453 07/18/16 0500  WBC 8.1 10.9* 6.1  HGB 10.5* 10.2* 8.7*  HCT 31.0* 30.5* 26.2*  PLT 121* 126* 109*    Coag's No results for input(s): APTT, INR in the last 168 hours.  Sepsis Markers  Recent Labs Lab 07/16/16 1103 07/17/16 0453 07/18/16 0500  PROCALCITON <0.10 <0.10 <0.10    ABG  Recent Labs Lab 07/16/16 1200 07/16/16 1508 07/17/16 0500  PHART 7.35 7.41 7.37  PCO2ART 88* 61* 69*  PO2ART 138* 179* 66*    Liver Enzymes  Recent Labs Lab 07/16/16 1103  AST 20  ALT 11*  ALKPHOS 60  BILITOT 1.5*  ALBUMIN 3.9    Cardiac Enzymes  Recent Labs Lab 07/16/16 1103  TROPONINI <0.03    Glucose  Recent Labs Lab 07/17/16 1148 07/17/16 1555 07/17/16 1950 07/18/16 0019 07/18/16 0401 07/18/16 0729  GLUCAP 115* 94 105* 120* 124* 115*    Imaging No  results found.   STUDIES:  1/26 CT Head >> No CT evidence of acute intracranial abnormality. Intracranial atherosclerosis. Re- demonstration of hypodensity in the right frontal lobe, suggesting prior infarction or demyelination. 1/26 CXR>>Elevated right hemidiaphragm.  Bibasilar atelectasis. Cardiomegaly, vascular congestion.   CULTURES: 1/26 Blood Cultures x2 >>  ANTIBIOTICS: 1/26 Meropenem>> 1/26 Vancomycin>>  SIGNIFICANT EVENTS: 1/26-Pt admitted to [facility] ICU due to acute on chronic hypercapnic respiratory failure secondary to AECOPD and ?Pneumonia requiring mechanical intubation PCCM contacted for management 1/27 on full vent support  LINES/TUBES: 1/26 ETT >> 1/26 Foley >>  ASSESSMENT / PLAN:  PULMONARY A: Acute on Chronic Hypercapnic Respiratory failure secondary to AECOPD and ?Pneumonia  Hx: Former smoker and home O2 use P:   Full vent support  Maintain O2 sats 88-92% SBT when parameters met Prn Bronchodilators VAP bundle  After further assessment, family has 2 choices 1)proceed with Trach 2)extubate and never to reintubate  Will await for family to arrive/contact ICU    CARDIOVASCULAR A:  Hypotension likely secondary to sedating medication>>Resolving Hx: HTN, Peripheral neuropathy, and Carotid artery occlusion P:  Continuous telemetry monitoring Maintain map >65  RENAL A:   No acute issues P:   Trend BMP's Replace electrolytes as indicated Monitor UOP  GASTROINTESTINAL A:   No acute issues P:   Initiate tube feedings  Pepcid for PUD Prophylaxis  HEMATOLOGIC A:   Anemia Thrombocytopenia P:  Trend CBC's Lovenox for VTE prophylaxis  Monitor for s/sx of bleeding Transfuse for hgb of <7  INFECTIOUS A:   ?Pneumonia P:   Trend WBC's and monitor fever curve Trend PCT's  Continue abx as listed above Follow cultures   ENDOCRINE A:   No acute issues  P:  Follow serum glucose  Hypo/Hyperglycemia Protocol  NEUROLOGIC A:   Acute  encephalopathy secondary to hypercapnia Hx: Depression P:   Maintain RASS goal: 0 to -1 Fentanyl gtt, prn fentanyl and versed to maintain RASS goal WUA daily Lights on during the day  Frequent reorientation Family presence promoted at bedside   I have personally obtained a history, examined the patient, evaluated Pertinent laboratory and RadioGraphic/imaging results, and  formulated the assessment and plan   The Patient requires high complexity decision making for assessment and support, frequent evaluation and titration of therapies, application of advanced monitoring technologies and extensive interpretation of multiple databases. Critical Care Time devoted to patient care services described in this note is 34 minutes.   Overall, patient is critically ill, prognosis is guarded.  Patient with Multiorgan failure and at high risk for cardiac arrest and death.    Corrin Parker, M.D.  [facility]  Medical Director [place] Director [facility] Cardio-Pulmonary Department

## 2016-07-18 NOTE — Progress Notes (Signed)
Precedex drip started due to increased agitation.

## 2016-07-19 DIAGNOSIS — J9622 Acute and chronic respiratory failure with hypercapnia: Secondary | ICD-10-CM

## 2016-07-19 DIAGNOSIS — J9621 Acute and chronic respiratory failure with hypoxia: Secondary | ICD-10-CM

## 2016-07-19 LAB — GLUCOSE, CAPILLARY
GLUCOSE-CAPILLARY: 113 mg/dL — AB (ref 65–99)
GLUCOSE-CAPILLARY: 133 mg/dL — AB (ref 65–99)
Glucose-Capillary: 116 mg/dL — ABNORMAL HIGH (ref 65–99)
Glucose-Capillary: 103 mg/dL — ABNORMAL HIGH (ref 65–99)
Glucose-Capillary: 109 mg/dL — ABNORMAL HIGH (ref 65–99)
Glucose-Capillary: 87 mg/dL (ref 65–99)

## 2016-07-19 LAB — TRIGLYCERIDES: TRIGLYCERIDES: 111 mg/dL (ref <150)

## 2016-07-19 MED ORDER — PROPOFOL 1000 MG/100ML IV EMUL
5.0000 ug/kg/min | INTRAVENOUS | Status: DC
  Administered 2016-07-19: 10 ug/kg/min via INTRAVENOUS
  Administered 2016-07-19: 35 ug/kg/min via INTRAVENOUS
  Administered 2016-07-19: 30 ug/kg/min via INTRAVENOUS
  Administered 2016-07-20: 40 ug/kg/min via INTRAVENOUS
  Administered 2016-07-20 (×2): 35 ug/kg/min via INTRAVENOUS
  Administered 2016-07-20: 45 ug/kg/min via INTRAVENOUS
  Administered 2016-07-20: 50 ug/kg/min via INTRAVENOUS
  Administered 2016-07-21: 45 ug/kg/min via INTRAVENOUS
  Administered 2016-07-21: 50 ug/kg/min via INTRAVENOUS
  Filled 2016-07-19 (×11): qty 100

## 2016-07-19 MED ORDER — BUDESONIDE 0.5 MG/2ML IN SUSP
0.5000 mg | Freq: Two times a day (BID) | RESPIRATORY_TRACT | Status: DC
Start: 2016-07-19 — End: 2016-07-27
  Administered 2016-07-19 – 2016-07-27 (×16): 0.5 mg via RESPIRATORY_TRACT
  Filled 2016-07-19 (×17): qty 2

## 2016-07-19 MED ORDER — IPRATROPIUM-ALBUTEROL 0.5-2.5 (3) MG/3ML IN SOLN
3.0000 mL | Freq: Four times a day (QID) | RESPIRATORY_TRACT | Status: DC
  Administered 2016-07-19 – 2016-07-24 (×20): 3 mL via RESPIRATORY_TRACT
  Filled 2016-07-19 (×20): qty 3

## 2016-07-19 NOTE — Care Management (Signed)
Patient presents from home.  Family was unable to reach and sent police for wellness check.  Patient found on the floor and confused after a fall.  Had a decline in the ED and required intubation.  Currently on full vent support with sedation. Treating for acute on chronic hypercapnic respiratory failure due to exacerbation of COPD and pneumonia

## 2016-07-19 NOTE — Progress Notes (Signed)
Bair hugger placed on patient due to hypothermia, every hour temps will be documented from foley probe. Will continue to monitor patient.

## 2016-07-19 NOTE — Progress Notes (Signed)
PULMONARY / CRITICAL CARE MEDICINE   Name: POSEY PETRIK MRN: [id number] DOB: [birth year]    ADMISSION DATE:  07/16/2016 CONSULTATION DATE:  07/16/16  REFERRING MD:  Dr. Merlyn Lot  CHIEF COMPLAINT:  Altered Mental Status and Acute Hypercapnic Respiratory Failure  HISTORY OF PRESENT ILLNESS:   Mr. Cipollone is a 71 y.o. male with a PMH of Peripheral Neuropathy, Lumbago, Hypertension, Former smoker, DJD, Depression, COPD (required tracheostomy that was recently decannulated 2 weeks prior to presentation to ER), Chronic neck and back pain, Cervical fusion-limited neck flexion secondary complication of anesthesia, Carotid artery occlusion, and Antalgic gait.  Per ER notes pts daughter stated she was unable to get in contact with the patient overnight 1/25, he was at baseline during the day on 01/25.  Police and EMS were called 1/26 to check on the patient, upon EMS arrival he was found on the floor following a fall.  He was alert, but confused to time and situation.  In the ER CT Head negative for any acute abnormalities, however the pt had a steady decline in his mental status concerning for hypercapnic respiratory failure secondary to AECOPD and ?Pneumonia requiring mechanical intubation.  PCCM contacted 01/26 to admit the pt to ICU.    REVIEW OF SYSTEMS:   Unable to access due to intubation and sedation  SUBJECTIVE:  Unable to wean from vent due to unstable airway in setting of COPD 1/27 unable to wean, h/o difficult airway, needs trach to survive Awaiting for brother of patient to make decision Remains on full vent support  VITAL SIGNS: BP 94/75   Pulse 63   Temp (!) 100.6 F (38.1 C)   Resp 20   Ht 5' 10"  (1.778 m)   Wt 229 lb 0.9 oz (103.9 kg)   SpO2 91%   BMI 32.87 kg/m   HEMODYNAMICS:    VENTILATOR SETTINGS: Vent Mode: PRVC FiO2 (%):  [35 %] 35 % Set Rate:  [16 bmp] 16 bmp Vt Set:  [500 mL] 500 mL PEEP:  [5 cmH20] 5 cmH20 Plateau Pressure:  [17 cmH20-20 cmH20] 17  cmH20  INTAKE / OUTPUT: I/O last 3 completed shifts: In: 3950.3 [I.V.:1926.8; NG/GT:1723.5; IV Piggyback:300] Out: 7829 [Urine:1275]  PHYSICAL EXAMINATION: General:  Acutely ill appearing Caucasian male, intubated and sedated lying in bed Neuro:  Sedated, withdraws from pain, no following commands, PERRL gcs<8T HEENT:  Atraumatic, normocephalic, neck supple, no JVD  Cardiovascular:  NSR on telemetry, RRR, s1s2, no M/R/G Lungs: Coarse throughout, symmetric expansion, no accessory muscle use, non labored Abdomen:  Active BS x4, soft, non-tender, non distended  Musculoskeletal:  Normal bulk and tone, no edema noted Skin:  Scattered abrasion and ecchymosis to upper and lower extremities, no rashes or lesions apparent   LABS:  BMET  Recent Labs Lab 07/16/16 1103 07/17/16 0453 07/18/16 0500  NA 134* 135 136  K 3.9 3.8 3.8  CL 89* 97* 98*  CO2 40* 35* 34*  BUN 13 13 19   CREATININE 0.88 0.78 0.89  GLUCOSE 102* 112* 121*    Electrolytes  Recent Labs Lab 07/16/16 1103 07/17/16 0453 07/17/16 1702 07/18/16 0500  CALCIUM 8.7* 8.2*  --  8.1*  MG 1.8 1.8 1.9 1.9  PHOS 2.8 3.2 4.0 3.2    CBC  Recent Labs Lab 07/16/16 1103 07/17/16 0453 07/18/16 0500  WBC 8.1 10.9* 6.1  HGB 10.5* 10.2* 8.7*  HCT 31.0* 30.5* 26.2*  PLT 121* 126* 109*    Coag's No results for input(s): APTT, INR in  the last 168 hours.  Sepsis Markers  Recent Labs Lab 07/16/16 1103 07/17/16 0453 07/18/16 0500  PROCALCITON <0.10 <0.10 <0.10    ABG  Recent Labs Lab 07/16/16 1200 07/16/16 1508 07/17/16 0500  PHART 7.35 7.41 7.37  PCO2ART 88* 61* 69*  PO2ART 138* 179* 66*    Liver Enzymes  Recent Labs Lab 07/16/16 1103  AST 20  ALT 11*  ALKPHOS 60  BILITOT 1.5*  ALBUMIN 3.9    Cardiac Enzymes  Recent Labs Lab 07/16/16 1103  TROPONINI <0.03    Glucose  Recent Labs Lab 07/18/16 1614 07/18/16 1927 07/18/16 2333 07/19/16 0339 07/19/16 0733 07/19/16 1150  GLUCAP  128* 133* 118* 113* 116* 133*    Imaging No results found.   STUDIES:  1/26 CT Head >> No CT evidence of acute intracranial abnormality. Intracranial atherosclerosis. Re- demonstration of hypodensity in the right frontal lobe, suggesting prior infarction or demyelination. 1/26 CXR>>Elevated right hemidiaphragm.  Bibasilar atelectasis. Cardiomegaly, vascular congestion.   CULTURES: 1/26 Blood Cultures x2 >>  ANTIBIOTICS: 1/26 Meropenem>> 1/26 Vancomycin>>  SIGNIFICANT EVENTS: 1/26-Pt admitted to [facility] ICU due to acute on chronic hypercapnic respiratory failure secondary to AECOPD and ?Pneumonia requiring mechanical intubation PCCM contacted for management 1/27 on full vent support  LINES/TUBES: 1/26 ETT >> 1/26 Foley >>  ASSESSMENT / PLAN:  PULMONARY A: Acute on Chronic Hypercapnic Respiratory failure secondary to AECOPD and ?Pneumonia  Hx: Former smoker and home O2 use P:   Full vent support  Maintain O2 sats 88-92% SBT when parameters met Prn Bronchodilators VAP bundle  After further assessment, family has 2 choices 1)proceed with Trach 2)extubate and never to reintubate  Will await for family to arrive/contact ICU    CARDIOVASCULAR A:  Hypotension likely secondary to sedating medication>>Resolving Hx: HTN, Peripheral neuropathy, and Carotid artery occlusion P:  Continuous telemetry monitoring Maintain map >65  RENAL A:   No acute issues P:   Trend BMP's Replace electrolytes as indicated Monitor UOP  GASTROINTESTINAL A:   No acute issues P:   Initiate tube feedings  Pepcid for PUD Prophylaxis  HEMATOLOGIC A:   Anemia Thrombocytopenia P:  Trend CBC's Lovenox for VTE prophylaxis  Monitor for s/sx of bleeding Transfuse for hgb of <7  INFECTIOUS A:   ?Pneumonia P:   Trend WBC's and monitor fever curve Trend PCT's  Continue abx as listed above Follow cultures   ENDOCRINE A:   No acute issues  P:  Follow serum glucose   Hypo/Hyperglycemia Protocol  NEUROLOGIC A:   Acute encephalopathy secondary to hypercapnia Hx: Depression P:   Maintain RASS goal: 0 to -1 Fentanyl gtt, prn fentanyl and versed to maintain RASS goal WUA daily Lights on during the day  Frequent reorientation Family presence promoted at bedside  Still very agitated. No wheezes on exam. Change from dex to propofol. Hopefully will allow more effective WUA and SBT (to allow DC fentanyl while still on short acting agent). I met with pt's brother. Pt has just been through a prolonged course of vent/trach/rehab, etc. We need to decide whether he is to go through a similar illness again recognizing that the previous bout portends a guarded prognosis  CCM time: 30 mins The above time includes time spent in consultation with patient and/or family members and reviewing care plan on multidisciplinary rounds  Merton Border, MD PCCM service Mobile [phone] Pager [phone]

## 2016-07-19 NOTE — Progress Notes (Signed)
Pharmacy Antibiotic Note  Chad LabradorDanny L Milliner is a 71 y.o. male with a h/o COPD admitted on 07/16/2016 with respiratory failure, possible pneumonia.  Pharmacy has been consulted for  meropenem dosing.   Plan: Will continue meropenem 1g IV Q8hr.    Height: 5\' 10"  (177.8 cm) Weight: 229 lb 0.9 oz (103.9 kg) IBW/kg (Calculated) : 73  Temp (24hrs), Avg:97.2 F (36.2 C), Min:96.3 F (35.7 C), Max:99.3 F (37.4 C)   Recent Labs Lab 07/16/16 1103 07/17/16 0453 07/18/16 0500 07/18/16 0913  WBC 8.1 10.9* 6.1  --   CREATININE 0.88 0.78 0.89  --   VANCOTROUGH  --   --   --  11*    Estimated Creatinine Clearance: 96 mL/min (by C-G formula based on SCr of 0.89 mg/dL).    Allergies  Allergen Reactions  . Ampicillin Swelling and Other (See Comments)    Unable to obtain enough information to answer additional questions about this medication.  Has patient had a PCN reaction causing immediate rash, facial/tongue/throat swelling, SOB or lightheadedness with hypotension: Yes Has patient had a PCN reaction causing severe rash involving mucus membranes or skin necrosis: No Has patient had a PCN reaction that required hospitalization No Has patient had a PCN reaction occurring within the last 10 years: Yes If all of the above answers are "NO", then may pr  . Nortriptyline Other (See Comments)    Sleep walking  . Brimonidine Other (See Comments)    Unknown reaction per MAR   . Cyclobenzaprine Other (See Comments)    Hallucinations  . Tizanidine Other (See Comments)    Hallucinations  . Gabapentin Rash    Antimicrobials this admission: Vancomycin 1/26 >> 1/27 Meropenem 1/26  >>   Dose adjustments this admission: N/A  Microbiology results: 1/26 BCx: NGTD 1/26 MRSA PCR: negative  Pharmacy will continue to monitor and adjust per consult.    Valentina GuChristy, Ailee Pates D 07/19/2016 12:38 PM

## 2016-07-19 NOTE — Progress Notes (Signed)
Notified MD Simmonds of hypotension. No new orders at this time, MD stated to wean off Fentanyl as much as possible. Will continue to monitor patient.

## 2016-07-19 NOTE — Progress Notes (Signed)
Fentanyl turned down to 200 mcg with MD Simmonds at bedside. Within a few minutes patient was not in sync with ventilator and becoming agitated. Fentanyl turned back to 400 mcg. Wake up assessment deferred at this time. MD to change sedation medication. Will continue to monitor patient.

## 2016-07-20 ENCOUNTER — Inpatient Hospital Stay: Payer: Medicare Other

## 2016-07-20 DIAGNOSIS — G9340 Encephalopathy, unspecified: Secondary | ICD-10-CM

## 2016-07-20 LAB — COMPREHENSIVE METABOLIC PANEL
ALT: 9 U/L — ABNORMAL LOW (ref 17–63)
ANION GAP: 3 — AB (ref 5–15)
AST: 14 U/L — ABNORMAL LOW (ref 15–41)
Albumin: 2.9 g/dL — ABNORMAL LOW (ref 3.5–5.0)
Alkaline Phosphatase: 49 U/L (ref 38–126)
BUN: 41 mg/dL — ABNORMAL HIGH (ref 6–20)
CHLORIDE: 100 mmol/L — AB (ref 101–111)
CO2: 34 mmol/L — AB (ref 22–32)
Calcium: 8.4 mg/dL — ABNORMAL LOW (ref 8.9–10.3)
Creatinine, Ser: 0.95 mg/dL (ref 0.61–1.24)
GFR calc Af Amer: >60 mL/min (ref >60)
Glucose, Bld: 94 mg/dL (ref 65–99)
Potassium: 4.5 mmol/L (ref 3.5–5.1)
SODIUM: 137 mmol/L (ref 135–145)
Total Bilirubin: 0.7 mg/dL (ref 0.3–1.2)
Total Protein: 5.5 g/dL — ABNORMAL LOW (ref 6.5–8.1)

## 2016-07-20 LAB — CBC
HCT: 29.4 % — ABNORMAL LOW (ref 40.0–52.0)
Hemoglobin: 9.5 g/dL — ABNORMAL LOW (ref 13.0–18.0)
MCH: 30 pg (ref 26.0–34.0)
MCHC: 32.3 g/dL (ref 32.0–36.0)
MCV: 92.7 fL (ref 80.0–100.0)
PLATELETS: 132 10*3/uL — AB (ref 150–440)
RBC: 3.17 MIL/uL — ABNORMAL LOW (ref 4.40–5.90)
RDW: 17.4 % — AB (ref 11.5–14.5)
WBC: 7.4 10*3/uL (ref 3.8–10.6)

## 2016-07-20 LAB — GLUCOSE, CAPILLARY
GLUCOSE-CAPILLARY: 90 mg/dL (ref 65–99)
Glucose-Capillary: 92 mg/dL (ref 65–99)
Glucose-Capillary: 73 mg/dL (ref 65–99)
Glucose-Capillary: 121 mg/dL — ABNORMAL HIGH (ref 65–99)
Glucose-Capillary: 120 mg/dL — ABNORMAL HIGH (ref 65–99)

## 2016-07-20 MED ORDER — PRO-STAT SUGAR FREE PO LIQD
60.0000 mL | Freq: Three times a day (TID) | ORAL | Status: DC
  Administered 2016-07-20 – 2016-07-21 (×3): 60 mL

## 2016-07-20 MED ORDER — METHYLPREDNISOLONE SODIUM SUCC 40 MG IJ SOLR
40.0000 mg | Freq: Two times a day (BID) | INTRAMUSCULAR | Status: DC
  Administered 2016-07-20 – 2016-07-21 (×4): 40 mg via INTRAVENOUS
  Filled 2016-07-20 (×5): qty 1

## 2016-07-20 MED ORDER — FAMOTIDINE 20 MG PO TABS
20.0000 mg | ORAL_TABLET | Freq: Two times a day (BID) | ORAL | Status: DC
  Administered 2016-07-20 – 2016-07-21 (×2): 20 mg
  Filled 2016-07-20 (×2): qty 1

## 2016-07-20 MED ORDER — POTASSIUM CHLORIDE 20 MEQ/15ML (10%) PO SOLN
20.0000 meq | Freq: Four times a day (QID) | ORAL | Status: AC
Start: 2016-07-20 — End: 2016-07-20
  Administered 2016-07-20 (×2): 20 meq
  Filled 2016-07-20 (×2): qty 15

## 2016-07-20 MED ORDER — FUROSEMIDE 10 MG/ML IJ SOLN
40.0000 mg | Freq: Four times a day (QID) | INTRAMUSCULAR | Status: AC
  Administered 2016-07-20 (×2): 40 mg via INTRAVENOUS
  Filled 2016-07-20 (×2): qty 4

## 2016-07-20 MED ORDER — VITAL HIGH PROTEIN PO LIQD
1000.0000 mL | ORAL | Status: DC
  Administered 2016-07-20: 1000 mL

## 2016-07-20 MED ORDER — FREE WATER
100.0000 mL | Freq: Three times a day (TID) | Status: DC
  Administered 2016-07-20 – 2016-07-21 (×3): 100 mL

## 2016-07-20 NOTE — Progress Notes (Signed)
PT PROFILE: 47 M s/p recent prolonged illness of acute on chronic respiratory failure/COPD that culminated in trach tube placement/vent SNF/rehab nursing facility (where he was decannulated) and ultimately discharge to home approx 8 days prior to this current admission. Re-admitted 01/26 via EMS/ED when he was found unresponsive due to severe hypercapnea and was re-intubated in the ED. Pt noted to have difficult airway due to prior cervical fusion  MAJOR EVENTS/TEST RESULTS: 01/26 Admission as above 01/26 CT head: NAICP 01/27 Echocardiogram: grade I diastolic dysfunction. LVEF 60%. LA mildly dilated. RV mildly dilated 01/27-01/29: full vent dependence. Intermittent agitation requiring fentanyl/dex infusions 01/29 Dex changed to propofol  INDWELLING DEVICES:: ETT 01/26 >>  RUE PICC 03/07/16 >>   MICRO DATA: MRSA PCR 01/26 >> NEG Blood 01/26 >> NEG  ANTIMICROBIALS:  Vanc 01/26 >> 01/27 Meropenem 01/26 >> 01/30  PCT 01/26-01/28: < 0.10 X 3  SUBJ: RASS -2, -3. Intermittent agitation. Propofol @ 50 mcg/kg/min and fentanyl @ 250 mcg/hr  OBJ: Vitals:   07/20/16 1105 07/20/16 1200 07/20/16 1202 07/20/16 1300  BP:  (!) 88/49 (!) 91/44 (!) 103/52  Pulse:  62 61 (!) 57  Resp:  16 16 16   Temp:  98.8 F (37.1 C) 98.8 F (37.1 C) 98.6 F (37 C)  TempSrc:   Core (Comment)   SpO2: 93% 92% 92% 91%  Weight:      Height:       Chronically ill, intubated, intermittently F/C HEENT NCAT, WNL Neck: prior trach stoma site clean Diffuse scattered wheezes Reg, no M NABS, soft Ext warm without edema MAEs, DTRs symmetric  BMP Latest Ref Rng & Units 07/20/2016 07/18/2016 07/17/2016  Glucose 65 - 99 mg/dL 94 657(Q) 469(G)  BUN 6 - 20 mg/dL 29(B) 19 13  Creatinine 0.61 - 1.24 mg/dL 2.84 1.32 4.40  Sodium 135 - 145 mmol/L 137 136 135  Potassium 3.5 - 5.1 mmol/L 4.5 3.8 3.8  Chloride 101 - 111 mmol/L 100(L) 98(L) 97(L)  CO2 22 - 32 mmol/L 34(H) 34(H) 35(H)  Calcium 8.9 - 10.3 mg/dL 1.0(U)  8.1(L) 8.2(L)   CBC Latest Ref Rng & Units 07/20/2016 07/18/2016 07/17/2016  WBC 3.8 - 10.6 K/uL 7.4 6.1 10.9(H)  Hemoglobin 13.0 - 18.0 g/dL 7.2(Z) 3.6(U) 10.2(L)  Hematocrit 40.0 - 52.0 % 29.4(L) 26.2(L) 30.5(L)  Platelets 150 - 440 K/uL 132(L) 109(L) 126(L)   CXR: elevated R hemidiaphragm, increased vasc congestion/mild edema   ASSESSMENT / PLAN:  PULMONARY A: Acute on chronic hypercarbic respiratory failure Severe COPD with acute exacerbation Suspect component of pulmonary edema P:   Cont vent support - settings reviewed and/or adjusted Cont vent bundle Daily SBT if/when meets criteria Continue nebulized steroids and bronchodilators Add systemic steroids 01/20 Lasix X 2 dose 01/30 Will likely need repeat trach tube placement  CARDIOVASCULAR A:  Diastolic HF (mild) P:  MAP goal 65 mmHg  RENAL A:   No issues P:   Monitor BMET intermittently Monitor I/Os Correct electrolytes as indicated  GASTROINTESTINAL A:   No issues P:   SUP: enteral famotidine Cont TF protocol  HEMATOLOGIC A:   Mild chronic anemia without acute blood loss Mild thrombocytopenia P:  DVT px: enoxaparin Monitor CBC intermittently Transfuse per usual guidelines  INFECTIOUS A:   No definite infectious processes identified P:   Monitor temp, WBC count Micro and abx as above  ENDOCRINE A:   No issues P:   Cont to monitor CBGs while on systemic steroids  NEUROLOGIC A:   Acute encephalopathy ICU/vent  associated discomfort P:   RASS goal: -1, -2 Cont fentanyl and propofol infusions   FAMILY  - Updates: Brother updated in detail @ bedside. E began discussions re: possible repeat trach tube placement which will be decided by end of week  CCM time: 40 mins The above time includes time spent in consultation with patient and/or family members and reviewing care plan on multidisciplinary rounds  Billy Fischeravid Simonds, MD PCCM service Mobile [phone] Pager  [phone]  07/20/2016, 2:44 PM

## 2016-07-20 NOTE — Progress Notes (Signed)
Nutrition Follow-up  DOCUMENTATION CODES:   Obesity unspecified  INTERVENTION:  TF: with current diprivan, recommend reducing TF rate to 25 ml/hr and increase Prostat to 60 mL TID (6 packets total per day); provides 1200 kcals, 143 g of protein and 500 mL of free water. Continue to assess -Pt with no BM since admission. Recommend addition of bowel regimen, recommend checking for impaction  NUTRITION DIAGNOSIS:   Inadequate oral intake related to acute illness as evidenced by NPO status.  Being addressed via TF  GOAL:   Provide needs based on ASPEN/SCCM guidelines  MONITOR:   TF tolerance, Labs, Weight trends, Vent status  REASON FOR ASSESSMENT:   Ventilator, Consult Enteral/tube feeding initiation and management  ASSESSMENT:   71 yo male admitted with acute on chronic respiratory failure secondary to AECOPD and possible pneumonia. Pt with hx of COPD, chronic pain, HTN; and hx of previous trach/PEG   Pt remains on full vent support, awaiting further determination regarding poc Tolerating Vital High Protein at rate of 45 ml/hr, Prostat TID Abdomen obese, soft, BS hypoactive, no BM  Labs: reviewed Meds: solumedrol, diprivan (21.8 ml/hr-575 kcals in 24 hours)  Diet Order:   NPO  Skin:  Reviewed, no issues  Last BM:  no documented BM  Height:   Ht Readings from Last 1 Encounters:  07/16/16 5\' 10"  (1.778 m)    Weight:   Wt Readings from Last 1 Encounters:  07/20/16 226 lb 6.6 oz (102.7 kg)    Filed Weights   07/18/16 0500 07/19/16 0500 07/20/16 0215  Weight: 228 lb 6.3 oz (103.6 kg) 229 lb 0.9 oz (103.9 kg) 226 lb 6.6 oz (102.7 kg)    BMI:  Body mass index is 32.49 kg/m.  Estimated Nutritional Needs:   Kcal:  [id number] kcals  Protein:  >/= 151 g  Fluid:  >/= 1.8 L  EDUCATION NEEDS:   No education needs identified at this time  Romelle StarcherCate Lexton Hidalgo MS, RD, LDN [phone] Pager  [phone] Weekend/On-Call Pager

## 2016-07-21 ENCOUNTER — Inpatient Hospital Stay: Payer: Medicare Other

## 2016-07-21 DIAGNOSIS — Z9911 Dependence on respirator [ventilator] status: Secondary | ICD-10-CM

## 2016-07-21 LAB — CBC
HCT: 28.5 % — ABNORMAL LOW (ref 40.0–52.0)
Hemoglobin: 9.3 g/dL — ABNORMAL LOW (ref 13.0–18.0)
MCH: 29.9 pg (ref 26.0–34.0)
MCHC: 32.6 g/dL (ref 32.0–36.0)
MCV: 91.6 fL (ref 80.0–100.0)
PLATELETS: 138 10*3/uL — AB (ref 150–440)
RBC: 3.11 MIL/uL — ABNORMAL LOW (ref 4.40–5.90)
RDW: 17.3 % — AB (ref 11.5–14.5)
WBC: 5.3 10*3/uL (ref 3.8–10.6)

## 2016-07-21 LAB — CULTURE, BLOOD (ROUTINE X 2)
CULTURE: NO GROWTH
Culture: NO GROWTH

## 2016-07-21 LAB — BASIC METABOLIC PANEL
Anion gap: 7 (ref 5–15)
BUN: 53 mg/dL — AB (ref 6–20)
CALCIUM: 8.5 mg/dL — AB (ref 8.9–10.3)
CO2: 37 mmol/L — AB (ref 22–32)
CREATININE: 0.9 mg/dL (ref 0.61–1.24)
Chloride: 97 mmol/L — ABNORMAL LOW (ref 101–111)
GFR calc Af Amer: >60 mL/min (ref >60)
GFR calc non Af Amer: >60 mL/min (ref >60)
GLUCOSE: 154 mg/dL — AB (ref 65–99)
Potassium: 4.8 mmol/L (ref 3.5–5.1)
Sodium: 141 mmol/L (ref 135–145)

## 2016-07-21 LAB — GLUCOSE, CAPILLARY
GLUCOSE-CAPILLARY: 135 mg/dL — AB (ref 65–99)
GLUCOSE-CAPILLARY: 94 mg/dL (ref 65–99)
Glucose-Capillary: 123 mg/dL — ABNORMAL HIGH (ref 65–99)
Glucose-Capillary: 134 mg/dL — ABNORMAL HIGH (ref 65–99)
Glucose-Capillary: 141 mg/dL — ABNORMAL HIGH (ref 65–99)
Glucose-Capillary: 147 mg/dL — ABNORMAL HIGH (ref 65–99)

## 2016-07-21 IMAGING — DX DG ABDOMEN 2V
3 series · 3 of 3 positions shown · non-contrast
Comparison: 10/23/2014

CLINICAL DATA: Constipation.  No bowel movement for 2 days.

EXAM:
ABDOMEN - 2 VIEW

[abdomen erect]
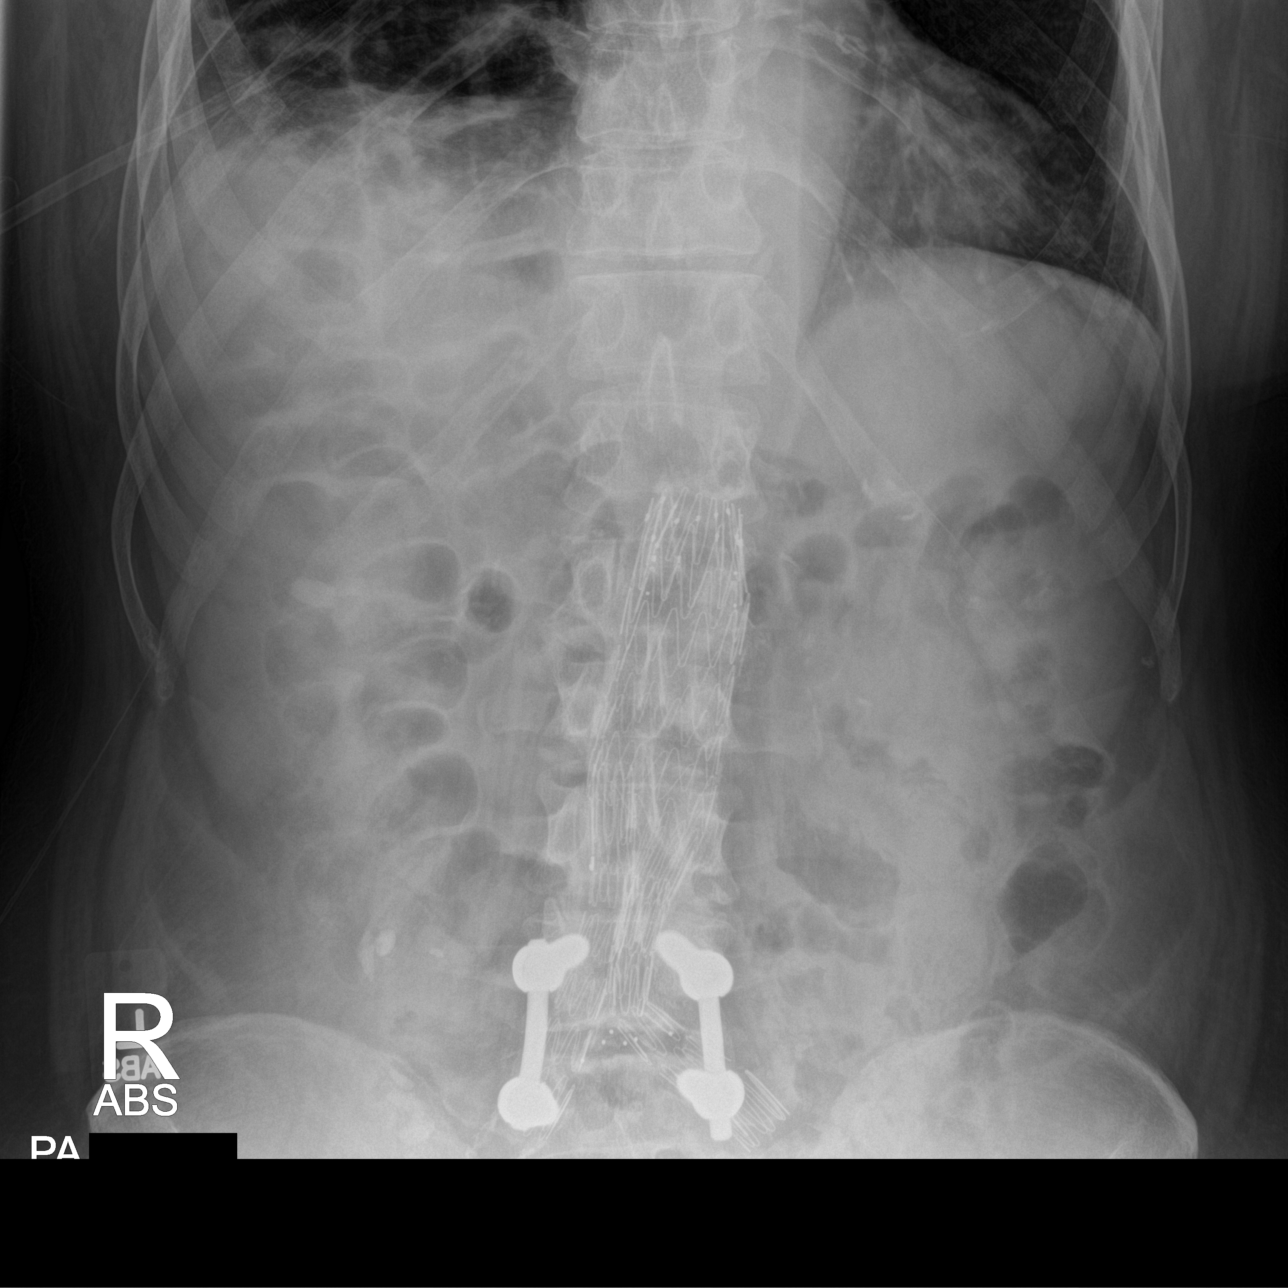

[abdomen supine (1 of 2)]
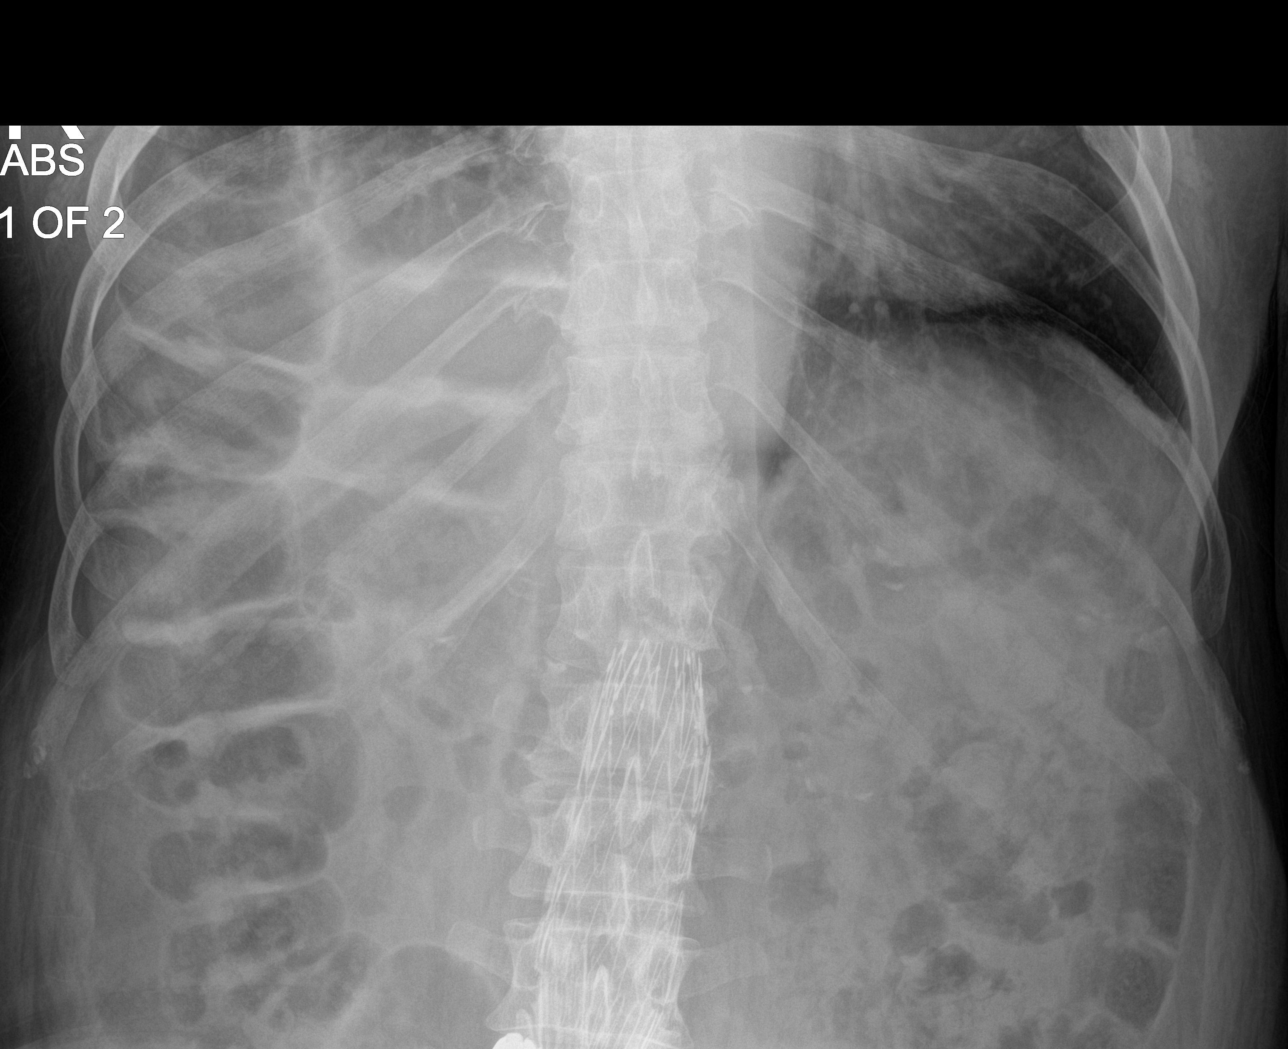

[abdomen supine (2 of 2)]
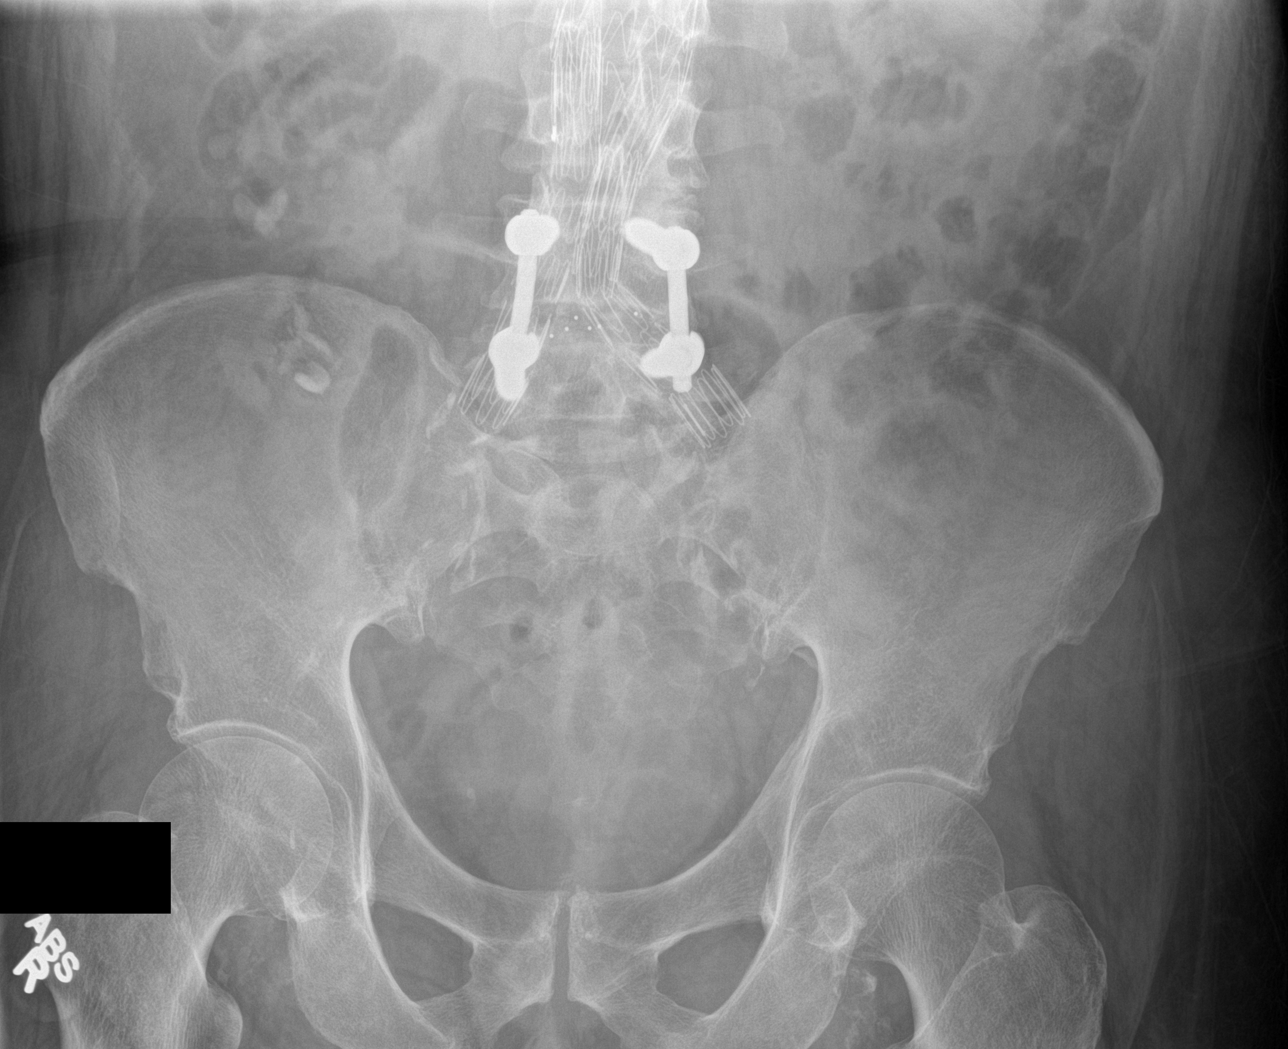

[3 of 3 positions shown; findings below may reference images not displayed]

FINDINGS: There is no evidence of intraperitoneal free air. There is elevation
of the right hemidiaphragm with mild gaseous distension of the
underlying hepatic flexure. A small amount of stool is present
throughout the colon. Gas is present in nondilated loops of small
bowel without evidence of obstruction. Subsegmental atelectasis is
partially visualized in the right lung base. Aorto bi-iliac stent
graft and prior L4-5 lumbar fusion are again noted.
IMPRESSION: Mild gaseous distension of the proximal colon. No evidence of bowel
obstruction.

## 2016-07-21 MED ORDER — CHLORHEXIDINE GLUCONATE 0.12 % MT SOLN
15.0000 mL | Freq: Two times a day (BID) | OROMUCOSAL | Status: DC
  Administered 2016-07-22: 15 mL via OROMUCOSAL
  Filled 2016-07-21: qty 15

## 2016-07-21 MED ORDER — LACTATED RINGERS IV SOLN
INTRAVENOUS | Status: DC
  Administered 2016-07-21 – 2016-07-22 (×2): via INTRAVENOUS

## 2016-07-21 MED ORDER — ORAL CARE MOUTH RINSE
15.0000 mL | Freq: Two times a day (BID) | OROMUCOSAL | Status: DC
  Administered 2016-07-21 (×2): 15 mL via OROMUCOSAL

## 2016-07-21 MED ORDER — HYDRALAZINE HCL 20 MG/ML IJ SOLN
10.0000 mg | INTRAMUSCULAR | Status: DC | PRN
  Administered 2016-07-21 – 2016-07-23 (×4): 10 mg via INTRAVENOUS
  Filled 2016-07-21 (×4): qty 1

## 2016-07-21 MED ORDER — FENTANYL CITRATE (PF) 100 MCG/2ML IJ SOLN: 12.5000 ug | INTRAMUSCULAR | Status: DC | PRN

## 2016-07-21 NOTE — Progress Notes (Signed)
Extubated onto 4L [place], follows commands, family in room, updated by Dr Sung AmabileSimonds

## 2016-07-21 NOTE — Care Management (Addendum)
Extubated and on nasal cannula. Patient reports a 56 day day hospitalization and was only home for 1 week before presented back to the ED. it appears from chart review patient spent some time at [facility] then [facility]- with discharge home 07/05/16.  He says he has chronic home 02 with [facility].  He says he was getting home health but not quite sure of the agency.  Will check with [facility] tomorrow.  PT consult is pending.  Requested OT consult also.  Patient during the previous hospital stay did have peg tub (which has been removed) and trach (which has healed) .  Patient admits that he is feeling confused at present. He says that his brother helps him make decisions.

## 2016-07-21 NOTE — Progress Notes (Signed)
PT PROFILE: 77 M s/p recent prolonged illness of acute on chronic respiratory failure/COPD that culminated in trach tube placement/vent SNF/rehab nursing facility (where he was decannulated) and ultimately discharge to home approx 8 days prior to this current admission. Re-admitted 01/26 via EMS/ED when he was found unresponsive due to severe hypercapnea and was re-intubated in the ED. Pt noted to have difficult airway due to prior cervical fusion  MAJOR EVENTS/TEST RESULTS: 01/26 Admission as above 01/26 CT head: NAICP 01/27 Echocardiogram: grade I diastolic dysfunction. LVEF 60%. LA mildly dilated. RV mildly dilated 01/27-01/29: full vent dependence. Intermittent agitation requiring fentanyl/dex infusions 01/29 Dex changed to propofol 01/31 Extubated   INDWELLING DEVICES:: ETT 01/26 >> 01/31 RUE PICC 03/07/16 >>   MICRO DATA: MRSA PCR 01/26 >> NEG Blood 01/26 >> NEG  ANTIMICROBIALS:  Vanc 01/26 >> 01/27 Meropenem 01/26 >> 01/30  PCT 01/26-01/28: < 0.10 X 3  SUBJ: Passed SBT. Extubated. Appears to be tolerating. Cognition appears to be intact  OBJ: Vitals:   07/21/16 0900 07/21/16 1000 07/21/16 1100 07/21/16 1200  BP: (!) 174/66 (!) 155/64 (!) 175/74 (!) 170/77  Pulse: 99 99 92 93  Resp: 14 19 14 14   Temp: 98.4 F (36.9 C) 98.6 F (37 C) 98.8 F (37.1 C) 99.1 F (37.3 C)  TempSrc:      SpO2: 100% 99% 97% (!) 87%  Weight:      Height:       RASS 0 post extubation, F/C HEENT NCAT, WNL Neck: prior trach stoma site clean No wheezes Reg, no M NABS, soft Ext warm without edema MAEs, DTRs symmetric  BMP Latest Ref Rng & Units 07/21/2016 07/20/2016 07/18/2016  Glucose 65 - 99 mg/dL 454(U) 94 981(X)  BUN 6 - 20 mg/dL 91(Y) 78(G) 19  Creatinine 0.61 - 1.24 mg/dL 9.56 2.13 0.86  Sodium 135 - 145 mmol/L 141 137 136  Potassium 3.5 - 5.1 mmol/L 4.8 4.5 3.8  Chloride 101 - 111 mmol/L 97(L) 100(L) 98(L)  CO2 22 - 32 mmol/L 37(H) 34(H) 34(H)  Calcium 8.9 - 10.3 mg/dL 5.7(Q)  4.6(N) 8.1(L)   CBC Latest Ref Rng & Units 07/21/2016 07/20/2016 07/18/2016  WBC 3.8 - 10.6 K/uL 5.3 7.4 6.1  Hemoglobin 13.0 - 18.0 g/dL 6.2(X) 5.2(W) 4.1(L)  Hematocrit 40.0 - 52.0 % 28.5(L) 29.4(L) 26.2(L)  Platelets 150 - 440 K/uL 138(L) 132(L) 109(L)   CXR: low lung volumes   ASSESSMENT / PLAN:  PULMONARY A: Acute on chronic hypercarbic respiratory failure Severe COPD with acute exacerbation Mild atelectasis P:   Supplemental O2 post extubation Cont nebulized steroids and BDs Cont systemic steroids - should stop or wean to off 02/01 PRN BiPAP Began discussions re: re-intubation status  CARDIOVASCULAR A:  Diastolic HF (mild) P:  MAP goal 65 mmHg Monitor  RENAL A:   No issues P:   Monitor BMET intermittently Monitor I/Os Correct electrolytes as indicated  GASTROINTESTINAL A:   Post extubation dysphagia P:   SUP: N/I post extubation NPO post extubation SLP eval 02/01  HEMATOLOGIC A:   Mild chronic anemia without acute blood loss Mild thrombocytopenia P:  DVT px: enoxaparin Monitor CBC intermittently Transfuse per usual guidelines  INFECTIOUS A:   No definite infectious processes identified P:   Monitor temp, WBC count Micro and abx as above  ENDOCRINE A:   No issues P:   Cont to monitor CBGs while on systemic steroids  NEUROLOGIC A:   Acute encephalopathy - resolved P:   RASS goal: 0 Low  dose PRN fentanyl    FAMILY  - Updates: Brother and god-daughterupdated in detail @ bedside. Plan goals of care discussion over next couple or few days  CCM time: 40 mins The above time includes time spent in consultation with patient and/or family members and reviewing care plan on multidisciplinary rounds  Billy Fischeravid Darshawn Boateng, MD PCCM service Mobile [phone] Pager [phone]  07/21/2016, 12:56 PM

## 2016-07-21 NOTE — Progress Notes (Signed)
Pt. Was suctioned prior to extubation for a mod. ampunt of thick white secretions. Per an order by Dr. Sung AmabileSimonds, he was extubated and placed on 4 L nasal cannula with a Sa02 of 94%.

## 2016-07-22 LAB — GLUCOSE, CAPILLARY
GLUCOSE-CAPILLARY: 106 mg/dL — AB (ref 65–99)
GLUCOSE-CAPILLARY: 98 mg/dL (ref 65–99)
Glucose-Capillary: 101 mg/dL — ABNORMAL HIGH (ref 65–99)

## 2016-07-22 MED ORDER — PNEUMOCOCCAL VAC POLYVALENT 25 MCG/0.5ML IJ INJ
0.5000 mL | INJECTION | INTRAMUSCULAR | Status: AC
  Administered 2016-07-23: 0.5 mL via INTRAMUSCULAR
  Filled 2016-07-22: qty 0.5

## 2016-07-22 MED ORDER — TRAZODONE HCL 50 MG PO TABS
50.0000 mg | ORAL_TABLET | Freq: Every evening | ORAL | Status: DC | PRN
  Administered 2016-07-22: 50 mg via ORAL
  Filled 2016-07-22: qty 1

## 2016-07-22 MED ORDER — PREDNISONE 20 MG PO TABS
40.0000 mg | ORAL_TABLET | Freq: Every day | ORAL | Status: DC
  Administered 2016-07-23 – 2016-07-24 (×2): 40 mg via ORAL
  Filled 2016-07-22 (×2): qty 2

## 2016-07-22 MED ORDER — ACETAMINOPHEN 325 MG PO TABS
650.0000 mg | ORAL_TABLET | Freq: Four times a day (QID) | ORAL | Status: DC | PRN
  Administered 2016-07-22 – 2016-07-24 (×3): 650 mg via ORAL
  Filled 2016-07-22 (×2): qty 2

## 2016-07-22 MED ORDER — INFLUENZA VAC SPLIT QUAD 0.5 ML IM SUSY
0.5000 mL | PREFILLED_SYRINGE | INTRAMUSCULAR | Status: AC
  Administered 2016-07-23: 0.5 mL via INTRAMUSCULAR
  Filled 2016-07-22: qty 0.5

## 2016-07-22 MED ORDER — PREDNISONE 20 MG PO TABS: 60.0000 mg | ORAL_TABLET | Freq: Every day | ORAL | Status: DC

## 2016-07-22 MED ORDER — DOCUSATE SODIUM 100 MG PO CAPS
100.0000 mg | ORAL_CAPSULE | Freq: Two times a day (BID) | ORAL | Status: AC
  Administered 2016-07-22 – 2016-07-24 (×5): 100 mg via ORAL
  Filled 2016-07-22 (×6): qty 1

## 2016-07-22 NOTE — Care Management (Signed)
Contacted by [facility].  Agency was providing RN PT OT

## 2016-07-22 NOTE — Progress Notes (Signed)
Per Annabelle Harmanana order APAP for patient

## 2016-07-22 NOTE — Progress Notes (Signed)
Nutrition Follow-up  DOCUMENTATION CODES:   Obesity unspecified  INTERVENTION:  -Cater to pt preferences -If po intake inadequate on follow-up, recommend addition of nutritional supplement  NUTRITION DIAGNOSIS:   Inadequate oral intake related to acute illness as evidenced by NPO status.  GOAL:   Provide needs based on ASPEN/SCCM guidelines  MONITOR:   TF tolerance, Labs, Weight trends, Vent status  REASON FOR ASSESSMENT:   Ventilator, Consult Enteral/tube feeding initiation and management  ASSESSMENT:   71 yo male admitted with acute on chronic respiratory failure secondary to AECOPD and possible pneumonia. Pt with hx of COPD, chronic pain, HTN; and hx of previous trach/PEG  Extubate 1/31 Diet advanced, recorded po intake 100% at breakfast this AM  Labs: reviewed Meds: prednisone   Diet Order:  Diet heart healthy/carb modified Room service appropriate? Yes; Fluid consistency: Thin  Skin:  Reviewed, no issues  Last BM:  no documented BM  Height:   Ht Readings from Last 1 Encounters:  07/20/16 5\' 10"  (1.778 m)    Weight:   Wt Readings from Last 1 Encounters:  07/22/16 216 lb 11.4 oz (98.3 kg)    Ideal Body Weight:     BMI:  Body mass index is 31.09 kg/m.  Estimated Nutritional Needs:   Kcal:  1900-2300 kcals  Protein:  95-115 g  Fluid:  >/= 1.9 L  EDUCATION NEEDS:   No education needs identified at this time  Romelle StarcherCate Rilla Buckman MS, RD, LDN [phone] Pager  [phone] Weekend/On-Call Pager

## 2016-07-22 NOTE — Progress Notes (Signed)
PT PROFILE: 50 M s/p recent prolonged illness of acute on chronic respiratory failure/COPD that culminated in trach tube placement/vent SNF/rehab nursing facility (where he was decannulated) and ultimately discharge to home approx 8 days prior to this current admission. Re-admitted 01/26 via EMS/ED when he was found unresponsive due to severe hypercapnea and was re-intubated in the ED. Pt noted to have difficult airway due to prior cervical fusion  MAJOR EVENTS/TEST RESULTS: 01/26 Admission as above 01/26 CT head: NAICP 01/27 Echocardiogram: grade I diastolic dysfunction. LVEF 60%. LA mildly dilated. RV mildly dilated 01/27-01/29: full vent dependence. Intermittent agitation requiring fentanyl/dex infusions 01/29 Dex changed to propofol 01/31 Extubated  02/01 Tolerating extubation. Advance activity and diet  INDWELLING DEVICES:: ETT 01/26 >> 01/31 RUE PICC 03/07/16 >> 07/22/16  MICRO DATA: MRSA PCR 01/26 >> NEG Blood 01/26 >> NEG  ANTIMICROBIALS:  Vanc 01/26 >> 01/27 Meropenem 01/26 >> 01/30 PCT 01/26-01/28: < 0.10 X 3  SUBJ: Tolerating extubation. Cognition intact  OBJ: Vitals:   07/22/16 0700 07/22/16 0716 07/22/16 0800 07/22/16 0900  BP: (!) 168/77  (!) 170/102 (!) 153/73  Pulse: 75  75 84  Resp: 14  15 19   Temp: 97.7 F (36.5 C)  97.9 F (36.6 C) 98.1 F (36.7 C)  TempSrc:   Core (Comment)   SpO2: 99% 97% 97% 98%  Weight:      Height:       NAD. Cognition intact HEENT NCAT, WNL Neck: prior trach stoma site clean No wheezes, distant BS Reg, no M NABS, soft Ext warm without edema No focal neuro deficits. Diffusely weak  BMP Latest Ref Rng & Units 07/21/2016 07/20/2016 07/18/2016  Glucose 65 - 99 mg/dL 161(W) 94 960(A)  BUN 6 - 20 mg/dL 54(U) 98(J) 19  Creatinine 0.61 - 1.24 mg/dL 1.91 4.78 2.95  Sodium 135 - 145 mmol/L 141 137 136  Potassium 3.5 - 5.1 mmol/L 4.8 4.5 3.8  Chloride 101 - 111 mmol/L 97(L) 100(L) 98(L)  CO2 22 - 32 mmol/L 37(H) 34(H) 34(H)   Calcium 8.9 - 10.3 mg/dL 6.2(Z) 3.0(Q) 8.1(L)   CBC Latest Ref Rng & Units 07/21/2016 07/20/2016 07/18/2016  WBC 3.8 - 10.6 K/uL 5.3 7.4 6.1  Hemoglobin 13.0 - 18.0 g/dL 6.5(H) 8.4(O) 9.6(E)  Hematocrit 40.0 - 52.0 % 28.5(L) 29.4(L) 26.2(L)  Platelets 150 - 440 K/uL 138(L) 132(L) 109(L)   CXR: NNF   ASSESSMENT / PLAN:  PULMONARY A: Acute on chronic hypercarbic respiratory failure Severe COPD with acute exacerbation, resolving P:   Cont supplemental O2 post extubation Cont nebulized steroids and BDs Cont systemic steroids - change to pred 02/01 PRN BiPAP  CARDIOVASCULAR A:  Diastolic HF (mild) P:  MAP goal 65 mmHg Monitor  RENAL A:   No issues P:   Monitor BMET intermittently Monitor I/Os Correct electrolytes as indicated  GASTROINTESTINAL A:   Post extubation dysphagia, resolved clinically P:   SUP: N/I post extubation Advance diet  HEMATOLOGIC A:   Mild chronic anemia without acute blood loss Mild thrombocytopenia P:  DVT px: enoxaparin Monitor CBC intermittently Transfuse per usual guidelines  INFECTIOUS A:   No definite infectious processes identified P:   Monitor temp, WBC count Micro and abx as above  ENDOCRINE A:   No issues P:   DC CBGs  NEUROLOGIC A:   Acute encephalopathy, resolved Severe deconditioning P:   RASS goal: 0 PT eval and rx   FAMILY  - Updates: NO family @ bedside  CCM time:  The above time  includes time spent in consultation with patient and/or family members and reviewing care plan on multidisciplinary rounds  Billy Fischeravid Simonds, MD PCCM service Mobile [phone] Pager [phone]  07/22/2016, 11:55 AM

## 2016-07-22 NOTE — Progress Notes (Signed)
Advance diet per Dr Sung AmabileSimonds

## 2016-07-22 NOTE — Clinical Social Work Note (Signed)
CSW consulted due to patient having been in a nursing home until about a week ago. RN CM is following and PT and OT are to assess patient. Patient has home services arranged. Please re-consult CSW if CSW needs arise. York SpanielMonica Crystalee Ventress MSW,LCSW [phone]

## 2016-07-22 NOTE — Evaluation (Signed)
Physical Therapy Evaluation Patient Details Name: Chad Huffman MRN: [id number] DOB: [birth year] Today's Date: 07/22/2016   History of Present Illness  pt is a 71 yo male presented to hospital w/ acute respiratory failure; was incubated but now on 3L O2 nasal cannula. Has a history of; COPD, peripheral neuropathy, Cervical fusions, R rotator cuff surgery.     Clinical Impression  Pt awake, alert and responsive to commands. Nursing consulted before PT eval and stated patient is medically stable to participate. Pt displays decreased strength (at least 3-/5) and ROM for R UE and L LE especially R shoulder AROM and L ankle Df. Pt required mod assistance to move from supine to sitting at EOB; sitting balance slightly unsteady and required bilateral UE support. He transferred to standing w/ RW and required mod assist to obtain upright posture and cuing for proper hand placement. Pt ambulated (12ft) w/ RW and min assist; demonstrated a slow, step to gait pattern w/ some R knee buckling and limited L dorsiflexion. O2 monitored throughout session and patient stayed above 88% on 3l/min via nasal cannula. Overall patient displays strength, ROM and cardiopulmonary deficits that limit safe household mobility. Recommend pt discharge to SNF following acute hospitalization.    Follow Up Recommendations SNF    Equipment Recommendations       Recommendations for Other Services       Precautions / Restrictions Precautions Precautions: Fall Restrictions Weight Bearing Restrictions: No      Mobility  Bed Mobility Overal bed mobility: Needs Assistance Bed Mobility: Supine to Sit     Supine to sit: Mod assist     General bed mobility comments: Pt required assistance for scooting and bringing trunk upright   Transfers Overall transfer level: Needs assistance Equipment used: Rolling walker (2 wheeled) Transfers: Sit to/from Stand Sit to Stand: Mod assist         General transfer comment: bilat UE  use for liftoff, cuing for hand placement   Ambulation/Gait Ambulation/Gait assistance: Min assist;+2 safety/equipment Ambulation Distance (Feet): 60 Feet Assistive device: Rolling walker (2 wheeled) Gait Pattern/deviations: Step-through pattern;Decreased step length - right;Decreased step length - left;Decreased stride length;Decreased dorsiflexion - left;Shuffle   Gait velocity interpretation: Below normal speed for age/gender General Gait Details: min assist, slightly unsteady, cuing for safe use of RW, weak pushoff on L side with minor R knee buckling   Stairs            Wheelchair Mobility    Modified Rankin (Stroke Patients Only)       Balance Overall balance assessment: Needs assistance Sitting-balance support: Bilateral upper extremity supported;Feet supported Sitting balance-Leahy Scale: Fair   Postural control: Posterior lean Standing balance support: Bilateral upper extremity supported Standing balance-Leahy Scale: Fair Standing balance comment: RW for additional stability                              Pertinent Vitals/Pain Pain Assessment: Faces Faces Pain Scale: Hurts a little bit Pain Location: R shoulder, neck, L LE Pain Descriptors / Indicators: Aching Pain Intervention(s): Monitored during session;Limited activity within patient's tolerance    Home Living Family/patient expects to be discharged to:: Private residence Living Arrangements: Alone Available Help at Discharge: Friend(s) Type of Home: House Home Access: Stairs to enter Entrance Stairs-Rails: Doctor, general practice of Steps: 4 Home Layout: One level Home Equipment: Environmental consultant - 2 wheels;Cane - single point;Bedside commode;Shower seat      Prior Function Level of  Independence: Independent with assistive device(s)         Comments: used cane at baseline was in hospital and rehab for 56 days prior to admittance to hospital     Hand Dominance   Dominant Hand:  Right    Extremity/Trunk Assessment   Upper Extremity Assessment Upper Extremity Assessment: RUE deficits/detail RUE Deficits / Details: decreased ROM and strength at 3-/5     Lower Extremity Assessment Lower Extremity Assessment: LLE deficits/detail LLE Deficits / Details: decreased DF, weak dorsiflexors (3-/5),        Communication   Communication: No difficulties  Cognition Arousal/Alertness: Awake/alert Behavior During Therapy: WFL for tasks assessed/performed Overall Cognitive Status: Within Functional Limits for tasks assessed                      General Comments      Exercises     Assessment/Plan    PT Assessment Patient needs continued PT services  PT Problem List Decreased strength;Decreased range of motion;Decreased activity tolerance;Decreased balance;Decreased mobility;Decreased knowledge of use of DME;Cardiopulmonary status limiting activity;Decreased safety awareness          PT Treatment Interventions DME instruction;Gait training;Stair training;Functional mobility training;Balance training;Therapeutic exercise;Therapeutic activities;Patient/family education    PT Goals (Current goals can be found in the Care Plan section)  Acute Rehab PT Goals Patient Stated Goal: Go to rehab and improve strength PT Goal Formulation: With patient Time For Goal Achievement: 08/05/16 Potential to Achieve Goals: Good    Frequency Min 2X/week   Barriers to discharge Inaccessible home environment;Decreased caregiver support lives alone    Co-evaluation               End of Session Equipment Utilized During Treatment: Gait belt;Oxygen Activity Tolerance: Patient limited by fatigue Patient left: in chair;with call bell/phone within reach;with chair alarm set Nurse Communication: Mobility status         Time: 1005-1046 PT Time Calculation (min) (ACUTE ONLY): 41 min   Charges:         PT G Codes:        [employer] Annalynne Ibanez Student  PT 07/22/2016, 11:53 AM

## 2016-07-23 ENCOUNTER — Inpatient Hospital Stay: Payer: Medicare Other

## 2016-07-23 LAB — GLUCOSE, CAPILLARY: GLUCOSE-CAPILLARY: 84 mg/dL (ref 65–99)

## 2016-07-23 MED ORDER — HYDRALAZINE HCL 20 MG/ML IJ SOLN
10.0000 mg | INTRAMUSCULAR | Status: DC | PRN
  Administered 2016-07-24: 10 mg via INTRAVENOUS
  Filled 2016-07-23: qty 1

## 2016-07-23 NOTE — Care Management Important Message (Signed)
Important Message  Patient Details  Name: Chad Huffman MRN: [id number] Date of Birth: March 23, 1946   Medicare Important Message Given:  Yes  Initial signed IM printed from Epic and given to patient.    Eber HongGreene, Reality Dejonge R, RN 07/23/2016, 8:25 AM

## 2016-07-24 DIAGNOSIS — R29898 Other symptoms and signs involving the musculoskeletal system: Secondary | ICD-10-CM

## 2016-07-24 MED ORDER — IPRATROPIUM-ALBUTEROL 0.5-2.5 (3) MG/3ML IN SOLN: 3.0000 mL | RESPIRATORY_TRACT | Status: DC | PRN

## 2016-07-24 MED ORDER — PREDNISONE 20 MG PO TABS
20.0000 mg | ORAL_TABLET | Freq: Every day | ORAL | Status: DC
  Administered 2016-07-25 – 2016-07-27 (×3): 20 mg via ORAL
  Filled 2016-07-24 (×3): qty 1

## 2016-07-24 MED ORDER — TIOTROPIUM BROMIDE MONOHYDRATE 18 MCG IN CAPS
18.0000 ug | ORAL_CAPSULE | Freq: Every day | RESPIRATORY_TRACT | Status: DC
  Administered 2016-07-24 – 2016-07-27 (×4): 18 ug via RESPIRATORY_TRACT
  Filled 2016-07-24: qty 5

## 2016-07-24 MED ORDER — ARFORMOTEROL TARTRATE 15 MCG/2ML IN NEBU
15.0000 ug | INHALATION_SOLUTION | Freq: Two times a day (BID) | RESPIRATORY_TRACT | Status: DC
  Administered 2016-07-24 – 2016-07-27 (×6): 15 ug via RESPIRATORY_TRACT
  Filled 2016-07-24 (×8): qty 2

## 2016-07-24 NOTE — NC FL2 (Signed)
[place] MEDICAID FL2 LEVEL OF CARE SCREENING TOOL     IDENTIFICATION  Patient Name: Chad Huffman Birthdate: 12-29-1945 Sex: male Admission Date (Current Location): 07/16/2016  [place] and [place] Number:  Chiropodist and Address:  [facility], [address], [place], [place] [address]      Provider Number: [id number]  Attending Physician Name and Address:  Erin Fulling, MD  Relative Name and Phone Number:       Current Level of Care: Hospital Recommended Level of Care: Skilled Nursing Facility Prior Approval Number:    Date Approved/Denied:   PASRR Number: [id number]  Discharge Plan: SNF    Current Diagnoses: Patient Active Problem List   Diagnosis Date Noted  . Respiratory failure (HCC) 07/16/2016  . 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 Dysphagia, pharyngoesophageal phase 05/18/2016  . GERD without esophagitis 05/09/2016  . Bilateral lower extremity edema 05/09/2016  . Slow transit constipation 05/09/2016  . Depression with anxiety 05/09/2016  . Respiratory failure with hypoxia and hypercapnia (HCC) 07/22/2015  . Cervical vertebral fusion   . Depression   . Chronic neck and back pain   . Acute on chronic respiratory failure with hypercapnia (HCC)   . COPD exacerbation (HCC)   . HCAP (healthcare-associated pneumonia)   . Congestive dilated cardiomyopathy (HCC)   . Syncope and collapse   . Arthritis of right hip 08/04/2014  . Essential hypertension 08/04/2014  . Hypothyroidism 08/04/2014  . Syncope 08/04/2014  . Fall 08/04/2014    Orientation RESPIRATION BLADDER Height & Weight     Self, Time, Situation, Place  O2 (3L o2) Continent Weight: 218 lb (98.9 kg) Height:  5\' 10"  (177.8 cm)  BEHAVIORAL SYMPTOMS/MOOD NEUROLOGICAL BOWEL NUTRITION STATUS      Continent Diet (Diet heart healthy/carb modified )  AMBULATORY STATUS COMMUNICATION OF NEEDS Skin   Extensive Assist  Verbally Normal                       Personal Care Assistance Level of Assistance  Bathing, Feeding, Dressing Bathing Assistance: Limited assistance Feeding assistance: Independent Dressing Assistance: Limited assistance     Functional Limitations Info             SPECIAL CARE FACTORS FREQUENCY  PT (By licensed PT)     PT Frequency: Up to 5 X per day, 5 days per week OT Frequency: Up to 5 X per day, 5 days per week            Contractures Contractures Info: Present    Additional Factors Info  Allergies   Allergies Info: Ampicillin, Nortriptyline, Brimonidine, Cyclobenzaprine, Tizanidine, Gabapentin           Current Medications (07/24/2016):  This is the current hospital active medication list Current Facility-Administered Medications  Medication Dose Route Frequency Provider Last Rate Last Dose  . acetaminophen (TYLENOL) tablet 650 mg  650 mg Oral Q6H PRN Eugenie Norrie, NP   650 mg at 07/23/16 2141  . arformoterol (BROVANA) nebulizer solution 15 mcg  15 mcg Nebulization BID Merwyn Katos, MD   15 mcg at 07/24/16 1340  . budesonide (PULMICORT) nebulizer solution 0.5 mg  0.5 mg Nebulization BID Merwyn Katos, MD   0.5 mg at 07/24/16 0730  . docusate sodium (COLACE) capsule 100 mg  100 mg Oral BID Merwyn Katos, MD   100 mg at 07/24/16 5621  . enoxaparin (LOVENOX) injection 40 mg  40 mg Subcutaneous Q24H Erin FullingKurian Kasa, MD   40 mg at 07/23/16 2141  . hydrALAZINE (APRESOLINE) injection 10 mg  10 mg Intravenous Q4H PRN Merwyn Katosavid B Simonds, MD   10 mg at 07/24/16 1354  . ipratropium-albuterol (DUONEB) 0.5-2.5 (3) MG/3ML nebulizer solution 3 mL  3 mL Nebulization Q4H PRN Merwyn Katosavid B Simonds, MD      . Melene Muller[START ON 07/25/2016] predniSONE (DELTASONE) tablet 20 mg  20 mg Oral Q breakfast Merwyn Katosavid B Simonds, MD      . tiotropium [facility]SPIRIVA) inhalation capsule 18 mcg  18 mcg Inhalation Daily Merwyn Katosavid B Simonds, MD   18 mcg at 07/24/16 1332  . traZODone (DESYREL) tablet 50 mg  50 mg Oral  QHS PRN Gwendolyn FillBincy S Varughese, NP   50 mg at 07/22/16 2225     Discharge Medications: Please see discharge summary for a list of discharge medications.  Relevant Imaging Results:  Relevant Lab Results:   Additional Information SS# [id number]  Judi CongKaren M Desmin Daleo, LCSW

## 2016-07-24 NOTE — Progress Notes (Signed)
BP 142/70. Patient is resting in bed, no complaints at this time. Will continue to monitor.

## 2016-07-24 NOTE — Progress Notes (Signed)
Physical Therapy Treatment Patient Details Name: Chad Huffman MRN: [id number] DOB: [birth year] Today's Date: 07/24/2016    History of Present Illness pt is a 71 yo male presented to hospital w/ acute respiratory failure; was incubated but now on 3L O2 nasal cannula. Has a history of; COPD, peripheral neuropathy, Cervical fusions, R rotator cuff surgery.     PT Comments    Mod a for bed mobility.  Pt was able to ambulate around nursing station this am with walker and +2 assist with O2 support on 2 lpm.  Generally unsteady gait with overall poor quality.  Pt motivated to increase functional independence.  Pt voicing frustration regarding general decline in mobility and strength.  "I don't know if I will ever get over this girls"  He remains motivated during therapy sessions.  SNF remains an appropriate discharge plan at this time as he is unable to get out of bed without assist and is unsafe to transfer or ambulate without  +1/2 at this time.  O2 96% HR 90, after gait 89% P 105 but sats quickly returned to baseline after a short rest.   Follow Up Recommendations  SNF     Equipment Recommendations       Recommendations for Other Services       Precautions / Restrictions Precautions Precautions: Fall Restrictions Weight Bearing Restrictions: No    Mobility  Bed Mobility Overal bed mobility: Needs Assistance Bed Mobility: Supine to Sit     Supine to sit: Mod assist     General bed mobility comments: Pt required assistance for scooting and bringing trunk upright   Transfers Overall transfer level: Needs assistance Equipment used: Rolling walker (2 wheeled) Transfers: Sit to/from Stand Sit to Stand: Mod assist            Ambulation/Gait Ambulation/Gait assistance: Min assist;+2 safety/equipment Ambulation Distance (Feet): 200 Feet Assistive device: Rolling walker (2 wheeled) Gait Pattern/deviations: Step-through pattern;Decreased step length - right;Decreased step  length - left;Decreased stride length;Decreased dorsiflexion - left;Shuffle   Gait velocity interpretation: Below normal speed for age/gender General Gait Details: hyperextends L knee with stance phase today, generally unsteady but able to recover LOB's with min assist.  Unsafe to attempt gait without assist.   Stairs            Wheelchair Mobility    Modified Rankin (Stroke Patients Only)       Balance Overall balance assessment: Needs assistance Sitting-balance support: Bilateral upper extremity supported;Feet supported Sitting balance-Leahy Scale: Fair     Standing balance support: Bilateral upper extremity supported Standing balance-Leahy Scale: Fair Standing balance comment: RW for additional stability                     Cognition Arousal/Alertness: Awake/alert Behavior During Therapy: WFL for tasks assessed/performed Overall Cognitive Status: Within Functional Limits for tasks assessed                      Exercises      General Comments        Pertinent Vitals/Pain Pain Assessment: 0-10 Pain Score: 2  Pain Location: R shoulder, neck, L LE Pain Descriptors / Indicators: Aching    Home Living                      Prior Function            PT Goals (current goals can now be found in the care plan section) Progress  towards PT goals: Progressing toward goals    Frequency    Min 2X/week      PT Plan      Co-evaluation             End of Session Equipment Utilized During Treatment: Gait belt;Oxygen Activity Tolerance: Patient limited by fatigue Patient left: in chair;with call bell/phone within reach;with chair alarm set     Time: [phone] PT Time Calculation (min) (ACUTE ONLY): 25 min  Charges:  $Gait Training: 8-22 mins $Therapeutic Activity: 8-22 mins                    G Codes:      Danielle Dess 2016-07-25, 9:27 AM

## 2016-07-24 NOTE — Progress Notes (Signed)
PT PROFILE: [age] M s/p recent prolonged illness of acute on chronic respiratory failure/COPD that culminated in trach tube placement/vent SNF/rehab nursing facility (where he was decannulated) and ultimately discharge to home approx 8 days prior to this current admission. Re-admitted 01/26 via EMS/ED when he was found unresponsive due to severe hypercapnea and was re-intubated in the ED. Pt noted to have difficult airway due to prior cervical fusion  MAJOR EVENTS/TEST RESULTS: 01/26 Admission as above 01/26 CT head: NAICP 01/27 Echocardiogram: grade I diastolic dysfunction. LVEF 60%. LA mildly dilated. RV mildly dilated 01/27-01/29: full vent dependence. Intermittent agitation requiring fentanyl/dex infusions 01/29 Dex changed to propofol 01/31 Extubated  02/01 Tolerating extubation. Advance activity and diet  02/02 Transferred to med-surg floor 02/03 Hospitalist Service will assume care as of AM 02/04  INDWELLING DEVICES:: ETT 01/26 >> 01/31 RUE PICC 03/07/16 >> 07/22/16  MICRO DATA: MRSA PCR 01/26 >> NEG Blood 01/26 >> NEG  ANTIMICROBIALS:  Vanc 01/26 >> 01/27 Meropenem 01/26 >> 01/30 PCT 01/26-01/28: < 0.10 X 3  SUBJ: No distress. No new complaints. Ambulating with assistance. Cognition intact  OBJ: Vitals:   07/24/16 0742 07/24/16 1345 07/24/16 1347 07/24/16 1423  BP: (!) 158/85  (!) 161/90 (!) 142/70  Pulse: 85  87 97  Resp: 20  18   Temp: 98.4 F (36.9 C)  98.9 F (37.2 C)   TempSrc: Axillary  Axillary   SpO2: 99% 95% 98%   Weight:      Height:       NAD. Cognition intact HEENT NCAT, WNL Neck: prior trach stoma site clean No wheezes, distant BS Reg, no M NABS, soft Ext warm without edema No focal neuro deficits. Diffusely weak  BMP Latest Ref Rng & Units 07/21/2016 07/20/2016 07/18/2016  Glucose 65 - 99 mg/dL 469(G154(H) 94 295(M121(H)  BUN 6 - 20 mg/dL 84(X53(H) 32(G41(H) 19  Creatinine 0.61 - 1.24 mg/dL 4.010.90 0.270.95 2.530.89  Sodium 135 - 145 mmol/L 141 137 136  Potassium 3.5 -  5.1 mmol/L 4.8 4.5 3.8  Chloride 101 - 111 mmol/L 97(L) 100(L) 98(L)  CO2 22 - 32 mmol/L 37(H) 34(H) 34(H)  Calcium 8.9 - 10.3 mg/dL 6.6(Y8.5(L) 4.0(H8.4(L) 8.1(L)   CBC Latest Ref Rng & Units 07/21/2016 07/20/2016 07/18/2016  WBC 3.8 - 10.6 K/uL 5.3 7.4 6.1  Hemoglobin 13.0 - 18.0 g/dL 4.7(Q9.3(L) 2.5(Z9.5(L) 5.6(L8.7(L)  Hematocrit 40.0 - 52.0 % 28.5(L) 29.4(L) 26.2(L)  Platelets 150 - 440 K/uL 138(L) 132(L) 109(L)   CXR 02/02: mild R basilar atx   ASSESSMENT: Recent prolonged illness due to COPD with vent/trach/LTACH/Rehab Acute on chronic hypercarbic respiratory failure Severe COPD with acute exacerbation, resolving Diastolic HF (mild) Post extubation dysphagia, resolved  Mild chronic anemia without acute blood loss Mild thrombocytopenia  Acute encephalopathy, resolved Severe deconditioning P:   Supplemental O2 to maintain SpO2 > 90% Change neb regimen to budesonide and arformoterol Continue PRN nebulized albuterol Add tiotropium  Progress activity as able Discharge planning Discussed with Dr [facility] will assume primary duties as of AM 02/04 His discharge regimen should be same as outlined above I have scheduled follow up with me in the office in 3-4 weeks  After transition of care to hospitalists, PCCM will sign off. Please call if we can be of further assistance  Billy Fischeravid Ariv Penrod, MD PCCM service Mobile [phone] Pager [phone]  07/24/2016, 3:16 PM

## 2016-07-24 NOTE — Progress Notes (Addendum)
At 1454 pt BP 161/90, nurse alerted and PRN Hydralazine administered per order. Will recheck.

## 2016-07-25 LAB — BASIC METABOLIC PANEL
ANION GAP: 5 (ref 5–15)
BUN: 21 mg/dL — AB (ref 6–20)
CALCIUM: 8.3 mg/dL — AB (ref 8.9–10.3)
CO2: 31 mmol/L (ref 22–32)
Chloride: 100 mmol/L — ABNORMAL LOW (ref 101–111)
Creatinine, Ser: 0.7 mg/dL (ref 0.61–1.24)
GFR calc Af Amer: >60 mL/min (ref >60)
Glucose, Bld: 96 mg/dL (ref 65–99)
POTASSIUM: 3.7 mmol/L (ref 3.5–5.1)
SODIUM: 136 mmol/L (ref 135–145)

## 2016-07-25 LAB — CBC
HEMATOCRIT: 31.6 % — AB (ref 40.0–52.0)
Hemoglobin: 10.5 g/dL — ABNORMAL LOW (ref 13.0–18.0)
MCH: 30.2 pg (ref 26.0–34.0)
MCHC: 33.4 g/dL (ref 32.0–36.0)
MCV: 90.5 fL (ref 80.0–100.0)
Platelets: 190 10*3/uL (ref 150–440)
RBC: 3.49 MIL/uL — ABNORMAL LOW (ref 4.40–5.90)
RDW: 17.2 % — AB (ref 11.5–14.5)
WBC: 5.9 10*3/uL (ref 3.8–10.6)

## 2016-07-25 MED ORDER — AMLODIPINE BESYLATE 5 MG PO TABS
5.0000 mg | ORAL_TABLET | Freq: Every day | ORAL | Status: DC
  Administered 2016-07-25 – 2016-07-26 (×2): 5 mg via ORAL
  Filled 2016-07-25 (×2): qty 1

## 2016-07-25 NOTE — Progress Notes (Signed)
[facility] [place] at [facility]   PATIENT NAME: Chad PaddockDanny Huffman    MR#:  [id number]  DATE OF BIRTH:  March 22, 1946  SUBJECTIVE:  CHIEF COMPLAINT:   Chief Complaint  Patient presents with  . Altered Mental Status   The patient is a 71 year old male with past topical history significant for history of chronic respiratory failure/COPD, who was recently hospitalized, which required the trach tube placement, status post skilled nursing facility/rehabilitation facility placement, recently discharged to home approximately week ago, who presents to the hospital with unresponsive episode. He was noted to have severe hypercapnia and was reintubated. In emergency room. On admission he had echocardiogram, revealing ejection fraction of 60%, markedly dilated LA and RV, grade 1 diastolic dysfunction, extubated on January 31. Diet was advanced, transferred to medical floor. Patient feels good today, denies any shortness of breath or cough or phlegm production Review of Systems  Constitutional: Negative for chills, fever and weight loss.  HENT: Negative for congestion.   Eyes: Negative for blurred vision and double vision.  Respiratory: Negative for cough, sputum production, shortness of breath and wheezing.   Cardiovascular: Negative for chest pain, palpitations, orthopnea, leg swelling and PND.  Gastrointestinal: Negative for abdominal pain, blood in stool, constipation, diarrhea, nausea and vomiting.  Genitourinary: Negative for dysuria, frequency, hematuria and urgency.  Musculoskeletal: Negative for falls.  Neurological: Negative for dizziness, tremors, focal weakness and headaches.  Endo/Heme/Allergies: Does not bruise/bleed easily.  Psychiatric/Behavioral: Negative for depression. The patient does not have insomnia.     VITAL SIGNS: Blood pressure (!) 152/82, pulse 75, temperature 99.2 F (37.3 C), temperature source Oral, resp. rate 18, height 5\' 10"  (1.778 m), weight 98.5  kg (217 lb 3.2 oz), SpO2 97 %.  PHYSICAL EXAMINATION:   GENERAL:  71 y.o.-year-old patient lying in the bed with no acute distress.  EYES: Pupils equal, round, reactive to light and accommodation. No scleral icterus. Extraocular muscles intact.  HEENT: Head atraumatic, normocephalic. Oropharynx and nasopharynx clear.  NECK:  Supple, no jugular venous distention. No thyroid enlargement, no tenderness.  LUNGS: Some diminished breath sounds bilaterally, few scattered wheezing, rhonchi , but no rales or crepitations. No use of accessory muscles of respiration.  CARDIOVASCULAR: S1, S2 normal. No murmurs, rubs, or gallops.  ABDOMEN: Soft, nontender, nondistended. Bowel sounds present. No organomegaly or mass.  EXTREMITIES: No pedal edema, cyanosis, or clubbing.  NEUROLOGIC: Cranial nerves II through XII are intact. Muscle strength 5/5 in all extremities. Sensation intact. Gait not checked.  PSYCHIATRIC: The patient is alert and oriented x 3.  SKIN: No obvious rash, lesion, or ulcer.   ORDERS/RESULTS REVIEWED:   CBC  Recent Labs Lab 07/20/16 0558 07/21/16 0328 07/25/16 0542  WBC 7.4 5.3 5.9  HGB 9.5* 9.3* 10.5*  HCT 29.4* 28.5* 31.6*  PLT 132* 138* 190  MCV 92.7 91.6 90.5  MCH 30.0 29.9 30.2  MCHC 32.3 32.6 33.4  RDW 17.4* 17.3* 17.2*   ------------------------------------------------------------------------------------------------------------------  Chemistries   Recent Labs Lab 07/20/16 0558 07/21/16 0328 07/25/16 0542  NA 137 141 136  K 4.5 4.8 3.7  CL 100* 97* 100*  CO2 34* 37* 31  GLUCOSE 94 154* 96  BUN 41* 53* 21*  CREATININE 0.95 0.90 0.70  CALCIUM 8.4* 8.5* 8.3*  AST 14*  --   --   ALT 9*  --   --   ALKPHOS 49  --   --   BILITOT 0.7  --   --    ------------------------------------------------------------------------------------------------------------------ estimated  creatinine clearance is 104 mL/min (by C-G formula based on SCr of 0.7  mg/dL). ------------------------------------------------------------------------------------------------------------------ No results for input(s): TSH, T4TOTAL, T3FREE, THYROIDAB in the last 72 hours.  Invalid input(s): FREET3  Cardiac Enzymes No results for input(s): CKMB, TROPONINI, MYOGLOBIN in the last 168 hours.  Invalid input(s): CK ------------------------------------------------------------------------------------------------------------------ Invalid input(s): POCBNP ---------------------------------------------------------------------------------------------------------------  RADIOLOGY: No results found.  EKG:  Orders placed or performed during the hospital encounter of 04/16/16  . EKG 12-Lead  . EKG 12-Lead  . EKG 12-Lead  . EKG 12-Lead  . EKG 12-Lead  . EKG 12-Lead  . EKG 12-Lead  . EKG 12-Lead  . EKG    ASSESSMENT AND PLAN:  Active Problems:   Respiratory failure (HCC)  #1. Acute on chronic respiratory failure with hypoxia and hypercapnia, appreciate pulmonary input, continue budesonide,  arformoterol, tiotropium, albuterol, patient is stable. Blood cultures are negative so far, MRSA PCR was negative. The patient is not on antibiotic #2. Leukocytosis, resolved #3. Marland Kitchen Thrombocytopenia, resolved #4 anemia, get Hemoccult, stable since admission #5. Essential hypertension, start Norvasc Management plans discussed with the patient, family and they are in agreement.   DRUG ALLERGIES:  Allergies  Allergen Reactions  . Ampicillin Swelling and Other (See Comments)    Unable to obtain enough information to answer additional questions about this medication.  Has patient had a PCN reaction causing immediate rash, facial/tongue/throat swelling, SOB or lightheadedness with hypotension: Yes Has patient had a PCN reaction causing severe rash involving mucus membranes or skin necrosis: No Has patient had a PCN reaction that required hospitalization No Has patient had a  PCN reaction occurring within the last 10 years: Yes If all of the above answers are "NO", then may pr  . Nortriptyline Other (See Comments)    Sleep walking  . Brimonidine Other (See Comments)    Unknown reaction per MAR   . Cyclobenzaprine Other (See Comments)    Hallucinations  . Tizanidine Other (See Comments)    Hallucinations  . Gabapentin Rash    CODE STATUS:     Code Status Orders        Start     Ordered   07/16/16 1310  Full code  Continuous     07/16/16 1312    Code Status History    Date Active Date Inactive Code Status Order ID Comments User Context   03/09/2016  5:20 PM 03/15/2016 10:06 PM Full Code [id number]  Heide Spark Inpatient   03/03/2016  6:09 PM 03/09/2016  4:04 PM Full Code [id number]  Erin Fulling, MD ED   02/27/2016 10:41 PM 02/29/2016  7:53 PM Full Code [id number]  Oralia Manis, MD Inpatient   07/22/2015  7:30 PM 07/26/2015 12:16 AM Full Code [id number]  Adrian Saran, MD Inpatient   02/11/2015 10:28 AM 02/11/2015  6:15 PM Full Code [id number]  Nada Libman, MD Inpatient   08/04/2014  8:13 PM 08/13/2014  2:53 PM Full Code [id number]  Lynden Oxford, MD Inpatient    Advance Directive Documentation   Flowsheet Row Most Recent Value  Type of Advance Directive  Living will  Pre-existing out of facility DNR order (yellow form or pink MOST form)  No data  "MOST" Form in Place?  No data      TOTAL TIME TAKING CARE OF THIS PATIENT: 35 minutes.    Katharina Caper M.D on 07/25/2016 at 3:25 PM  Between 7am to 6pm - Pager - [phone]  After 6pm go to [email or web address] - password [email or web address] [facility]  Office  [phone]  CC: Primary care physician; Kirt Boys, DO

## 2016-07-25 NOTE — Clinical Social Work Note (Signed)
Clinical Social Work Assessment  Patient Details  Name: Chad Huffman MRN: [id number] Date of Birth: August 30, 1945  Date of referral:  07/25/16               Reason for consult:  Facility Placement                Permission sought to share information with:  Chartered certified accountant granted to share information::  Yes, Verbal Permission Granted  Name::        Agency::     Relationship::     Contact Information:     Housing/Transportation Living arrangements for the past 2 months:  Single Family Home Source of Information:  Patient Patient Interpreter Needed:  None Criminal Activity/Legal Involvement Pertinent to Current Situation/Hospitalization:  No - Comment as needed Significant Relationships:  Siblings Lives with:  Self Do you feel safe going back to the place where you live?  Yes Need for family participation in patient care:  No (Coment)  Care giving concerns:  PT recommendation for STR   Social Worker assessment / plan:  CSW met with patient at bedside to discuss dc planning. CSW advised the patient of Medicare guidelines for payment, and the patient verbalized understanding. He is not sure how many days he has used at [facility] in this calendar year. He is aware that he may have a copay.  At baseline, the patient uses o2 chronically at 3L and is currently undergoing testing to see if he should have more or less. The patient uses a walker and a cane, and he lives alone. The patient is able to manage all ADLs and IADLs except for driving, and he manages his own medications and finances. The patient is continent of both bladder and bowel. He has a close relationship with his brother. Employment status:  Retired Forensic scientist:  [employer] PT Recommendations:  [place] / Referral to community resources:  [place]  Patient/Family's Response to care:  The patient thanked the CSW for assistance.  Patient/Family's  Understanding of and Emotional Response to Diagnosis, Current Treatment, and Prognosis:  The patient is in agreement with dc plannning.  Emotional Assessment Appearance:  Appears stated age Attitude/Demeanor/Rapport:   (Pleasant) Affect (typically observed):  Accepting, Appropriate, Pleasant Orientation:  Oriented to Self, Oriented to Place, Oriented to  Time, Oriented to Situation Alcohol / Substance use:  Never Used Psych involvement (Current and /or in the community):  No (Comment)  Discharge Needs  Concerns to be addressed:  Care Coordination Readmission within the last 30 days:  Yes Current discharge risk:  Chronically ill Barriers to Discharge:  Continued Medical Work up   [employer], LCSW 07/25/2016, 4:49 PM

## 2016-07-25 NOTE — Progress Notes (Signed)
Weaned pt down to 2LPM O2 via [place] [facility] [place] per order. O2 sats remained above 94%. Pt tolerated well, no complains of SOB. Will continue to monitor.  Suzan SlickAlison L Zavior Thomason RN

## 2016-07-26 MED ORDER — POTASSIUM CHLORIDE CRYS ER 20 MEQ PO TBCR
40.0000 meq | EXTENDED_RELEASE_TABLET | Freq: Once | ORAL | Status: AC
  Administered 2016-07-26: 40 meq via ORAL
  Filled 2016-07-26: qty 2

## 2016-07-26 MED ORDER — LISINOPRIL 10 MG PO TABS
10.0000 mg | ORAL_TABLET | Freq: Two times a day (BID) | ORAL | Status: DC
  Administered 2016-07-26 – 2016-07-27 (×2): 10 mg via ORAL
  Filled 2016-07-26 (×2): qty 1

## 2016-07-26 MED ORDER — FUROSEMIDE 10 MG/ML IJ SOLN
20.0000 mg | Freq: Once | INTRAMUSCULAR | Status: AC
  Administered 2016-07-26: 20 mg via INTRAVENOUS
  Filled 2016-07-26: qty 2

## 2016-07-26 MED ORDER — METOPROLOL TARTRATE 25 MG PO TABS
25.0000 mg | ORAL_TABLET | Freq: Two times a day (BID) | ORAL | Status: DC
  Administered 2016-07-26 – 2016-07-27 (×2): 25 mg via ORAL
  Filled 2016-07-26 (×2): qty 1

## 2016-07-26 NOTE — Evaluation (Signed)
Clinical/Bedside Swallow Evaluation Patient Details  Name: Chad LabradorDanny L Huffman MRN: [id number] Date of Birth: 10-Jan-1946  Today's Date: 07/26/2016 Time: SLP Start Time (ACUTE ONLY): 1100 SLP Stop Time (ACUTE ONLY): 1200 SLP Time Calculation (min) (ACUTE ONLY): 60 min  Past Medical History:  Past Medical History:  Diagnosis Date  . Antalgic gait   . Carotid artery occlusion   . Chronic neck and back pain   . Complication of anesthesia    pt has had cervical fusion-limited neck flextion  . COPD (chronic obstructive pulmonary disease) (HCC)   . Depression   . DJD (degenerative joint disease)    Right shoulder  . Hypertension   . Lumbago   . Peripheral neuropathy [facility](HCC)    Past Surgical History:  Past Surgical History:  Procedure Laterality Date  . ABDOMINAL AORTIC ANEURYSM REPAIR  2009   Endovascular AAA repair  . ABDOMINAL AORTIC ANEURYSM REPAIR    . CARPAL TUNNEL RELEASE     bilateral CTS  . HAMMER TOE SURGERY  12/10/2011   Procedure: HAMMER TOE CORRECTION;  Surgeon: Ernestene KielMax Hyatt, DPM;  Location: [facility];  Service: Podiatry;  Laterality: Right;  Right hammertoe repair second right with 2.5x42 orthopro screw   . IR GENERIC HISTORICAL  04/05/2016   IR GASTROSTOMY TUBE MOD SED 04/05/2016 [facility]  . IR GENERIC HISTORICAL  05/12/2016   IR CM INJ ANY COLONIC TUBE W/FLUORO 05/12/2016 Malachy MoanHeath McCullough, MD [facility]  . IR GENERIC HISTORICAL  05/26/2016   IR CM INJ ANY COLONIC TUBE W/FLUORO 05/26/2016 [facility]  . IR GENERIC HISTORICAL  05/28/2016   IR REPLC GASTRO/COLONIC TUBE PERCUT W/FLUORO 05/28/2016 Brayton ElKevin Bruning, PA-C [facility]  . IR GENERIC HISTORICAL  07/02/2016   IR GASTROSTOMY TUBE REMOVAL 07/02/2016 Brayton ElKevin Bruning, PA-C [facility]  . METATARSAL OSTEOTOMY  12/10/2011   Procedure: METATARSAL OSTEOTOMY;  Surgeon: Ernestene KielMax Hyatt, DPM;  Location: [facility];  Service: Podiatry;  Laterality: Right;  Right Keller arthroplasty with size 5 silicone  implant right great toe; second metatarsal osteotomy with 2.0x14 screw  . NECK SURGERY    . PERIPHERAL VASCULAR CATHETERIZATION N/A 02/11/2015   Procedure: Carotid Angiography;  Surgeon: Nada LibmanVance W Brabham, MD;  [facility];  Service: Cardiovascular;  Laterality: N/A;  . PILONIDAL CYST / SINUS EXCISION    . Rotary cuff    . SPINE SURGERY  09/15/11   Scar tissue removed- back  . SPINE SURGERY  09/15/11   Scar tissue removed- back  . TONSILLECTOMY    . TRACHEOSTOMY TUBE PLACEMENT N/A 03/03/2016   Procedure: TRACHEOSTOMY;  Surgeon: Geanie LoganPaul Bennett, MD;  Location: [facility];  Service: ENT;  Laterality: N/A;   HPI:  Pt is a 71 y.o. male with a PMH of Peripheral Neuropathy, Lumbago, Hypertension, Former smoker, DJD, Depression, COPD (required tracheostomy that was recently decannulated 2 weeks prior to presentation to ER), Chronic neck and back pain, Cervical fusion-limited neck flexion secondary complication of anesthesia, Carotid artery occlusion, and Antalgic gait.  Per ER notes pts daughter stated she was unable to get in contact with the patient overnight 1/25, he was at baseline during the day on 01/25.  Police and EMS were called 1/26 to check on the patient, upon EMS arrival he was found on the floor following a fall.  He was alert, but confused to time and situation.  In the ER CT Head negative for any acute abnormalities, however the pt had a steady decline in his mental status  concerning for hypercapnic respiratory failure secondary to AECOPD and ?Pneumonia requiring mechanical intubation.  PCCM contacted 01/26 to admit the pt to ICU. Pt was extubated 07/21/16 with no current complaints of pain, swallowing trouble or po intake trouble to SLP.    Assessment / Plan / Recommendation Clinical Impression  Pt currently appears at reduced risk for aspiration when following aspiration precautions with regular diet and thin liquids. Upon enterance, SLP noted pt coughing; pt reported coughing up  "phlegm" from recent extubation.  Mild, intermittent cough noted throughout trials of liquids and solids; pt denied any feeling of choking or "getting strangled", just coughing up phlegm. No significant increase of coughing presentation from his baseline. No oral phase deficits noted (no coughing, no oral spillage). Thourough educated provided on aspiration precautions (small sips, reducing distractions, alternating liquids with solids). Pt did report sometimes feeling like "too much air goes down when I drink too much water through a straw at a time"; education provided on using cup sips rather than straw sips to reduce amount of air intake. Recommend pt continue with regular diet and thin liquids following general aspiration precautions. No further skilled ST services needed. NSG to re-consult if change in status during visit.     Aspiration Risk   (reduced)    Diet Recommendation   Regular diet with thin liquids following aspiration precautions.  Medication Administration: Whole meds with liquid (with puree if needed)    Other  Recommendations Oral Care Recommendations: Oral care BID;Patient independent with oral care   Follow up Recommendations None      Frequency and Duration            Prognosis Prognosis for Safe Diet Advancement: Good      Swallow Study   General Date of Onset: 07/16/16 HPI: Pt is a 71 y.o. male with a PMH of Peripheral Neuropathy, Lumbago, Hypertension, Former smoker, DJD, Depression, COPD (required tracheostomy that was recently decannulated 2 weeks prior to presentation to ER), Chronic neck and back pain, Cervical fusion-limited neck flexion secondary complication of anesthesia, Carotid artery occlusion, and Antalgic gait.  Per ER notes pts daughter stated she was unable to get in contact with the patient overnight 1/25, he was at baseline during the day on 01/25.  Police and EMS were called 1/26 to check on the patient, upon EMS arrival he was found on the floor  following a fall.  He was alert, but confused to time and situation.  In the ER CT Head negative for any acute abnormalities, however the pt had a steady decline in his mental status concerning for hypercapnic respiratory failure secondary to AECOPD and ?Pneumonia requiring mechanical intubation.  PCCM contacted 01/26 to admit the pt to ICU. Pt was extubated 07/21/16 with no current complaints of pain, swallowing trouble or po intake trouble to SLP.  Type of Study: Bedside Swallow Evaluation Previous Swallow Assessment: 06/04/16 Diet Prior to this Study: Regular;Thin liquids Temperature Spikes Noted: No Respiratory Status: Nasal cannula History of Recent Intubation: Yes Length of Intubations (days): 5 days (1/27 to 1/31 ) Date extubated: 07/21/16 Behavior/Cognition: Alert;Cooperative;Pleasant mood Oral Cavity Assessment: Within Functional Limits Oral Care Completed by SLP: Recent completion by staff Oral Cavity - Dentition: Adequate natural dentition Vision: Functional for self-feeding Self-Feeding Abilities: Able to feed self;Needs set up Patient Positioning: Upright in chair;Postural control adequate for testing Baseline Vocal Quality: Normal Volitional Cough: Strong;Congested Volitional Swallow:  (dnt)    Oral/Motor/Sensory Function Overall Oral Motor/Sensory Function: Within functional limits   Ice  Chips Ice chips: Not tested   Thin Liquid Thin Liquid: Within functional limits Presentation: Self Fed;Straw (X3 trials)    Nectar Thick Nectar Thick Liquid: Not tested   Honey Thick Honey Thick Liquid: Not tested   Puree Puree: Within functional limits Presentation: Spoon;Self Fed (X9 trials )   Solid   GO   Solid: Within functional limits Presentation: Self Fed;Spoon (softened solids X3 trials)        Ardelia Mems, B.S Graduate Clinician  07/26/2016,1:54 PM   This information has been reviewed and agreed upon by this supervising clinician.  Jerilynn Som, MS,  CCC-SLP

## 2016-07-26 NOTE — Clinical Social Work Placement (Signed)
   CLINICAL SOCIAL WORK PLACEMENT  NOTE  Date:  07/26/2016  Patient Details  Name: Chad Huffman MRN: [id number] Date of Birth: 25-Sep-1945  Clinical Social Work is seeking post-discharge placement for this patient at the Skilled  Nursing Facility level of care (*CSW will initial, date and re-position this form in  chart as items are completed):  Yes   Patient/family provided with [place] Clinical Social Work Department's list of facilities offering this level of care within the geographic area requested by the patient (or if unable, by the patient's family).  Yes   Patient/family informed of their freedom to choose among providers that offer the needed level of care, that participate in Medicare, Medicaid or managed care program needed by the patient, have an available bed and are willing to accept the patient.  Yes   Patient/family informed of [facility]'s ownership interest in [facility] and [facility], as well as of the fact that they are under no obligation to receive care at these facilities.  PASRR submitted to EDS on       PASRR number received on       Existing PASRR number confirmed on 07/25/16     FL2 transmitted to all facilities in geographic area requested by pt/family on 07/25/16     FL2 transmitted to all facilities within larger geographic area on       Patient informed that his/her managed care company has contracts with or will negotiate with certain facilities, including the following:        Yes   Patient/family informed of bed offers received.  Patient chooses bed at  [facility] )     Physician recommends and patient chooses bed at      Patient to be transferred to   on  .  Patient to be transferred to facility by       Patient family notified on   of transfer.  Name of family member notified:        PHYSICIAN       Additional Comment:    _______________________________________________ Carsyn Boster, Darleen CrockerBailey M, LCSW 07/26/2016,  3:39 PM

## 2016-07-26 NOTE — Progress Notes (Signed)
[facility] [place] [facility]   PATIENT NAME: Chad PaddockDanny Toso    MR#:  [id number]  DATE OF BIRTH:  1946-01-16  SUBJECTIVE:  CHIEF COMPLAINT:   Chief Complaint  Patient presents with  . Altered Mental Status   The patient is a 71 year old male with past topical history significant for history of chronic respiratory failure/COPD, who was recently hospitalized, which required the trach tube placement, status post skilled nursing facility/rehabilitation facility placement, recently discharged to home approximately week ago, who presents to the hospital with unresponsive episode. He was noted to have severe hypercapnia and was reintubated. In emergency room. On admission he had echocardiogram, revealing ejection fraction of 60%, markedly dilated LA and RV, grade 1 diastolic dysfunction, extubated on January 31. Diet was advanced, transferred to medical floor. Patient feels satisfactory today, still some  shortness of breath , no cough or phlegm production. Admits of lower extremity, arm swelling. Chest x-ray reveals right basilar atelectasis second of February, however, showed interstitial edema on admission.   Review of Systems  Constitutional: Negative for chills, fever and weight loss.  HENT: Negative for congestion.   Eyes: Negative for blurred vision and double vision.  Respiratory: Positive for cough and shortness of breath. Negative for sputum production and wheezing.   Cardiovascular: Negative for chest pain, palpitations, orthopnea, leg swelling and PND.  Gastrointestinal: Negative for abdominal pain, blood in stool, constipation, diarrhea, nausea and vomiting.  Genitourinary: Negative for dysuria, frequency, hematuria and urgency.  Musculoskeletal: Negative for falls.  Neurological: Negative for dizziness, tremors, focal weakness and headaches.  Endo/Heme/Allergies: Does not bruise/bleed easily.  Psychiatric/Behavioral: Negative for depression. The patient  does not have insomnia.     VITAL SIGNS: Blood pressure (!) 159/75, pulse 72, temperature 98.6 F (37 C), temperature source Oral, resp. rate 18, height 5\' 10"  (1.778 m), weight 97.1 kg (214 lb), SpO2 94 %.  PHYSICAL EXAMINATION:   GENERAL:  71 y.o.-year-old patient lying in the bed . Mild respiratory distress, somewhat tachypneic, pursed lip breathing, uncomfortable.  EYES: Pupils equal, round, reactive to light and accommodation. No scleral icterus. Extraocular muscles intact.  HEENT: Head atraumatic, normocephalic. Oropharynx and nasopharynx clear.  NECK:  Supple, no jugular venous distention. No thyroid enlargement, no tenderness.  LUNGS: Some diminished breath sounds bilaterally, no  wheezing, rhonchi , but basilar rales and crepitations bilaterally, more on the right side. Intermittent use of accessory muscles of respiration, somewhat uncomfortable today.  CARDIOVASCULAR: S1, S2 normal. No murmurs, rubs, or gallops.  ABDOMEN: Soft, nontender, nondistended. Bowel sounds present. No organomegaly or mass.  EXTREMITIES: Trace upper and lower extremity, pedal edema, no cyanosis, or clubbing.  NEUROLOGIC: Cranial nerves II through XII are intact. Muscle strength 5/5 in all extremities. Sensation intact. Gait not checked.  PSYCHIATRIC: The patient is alert and oriented x 3.  SKIN: No obvious rash, lesion, or ulcer.   ORDERS/RESULTS REVIEWED:   CBC  Recent Labs Lab 07/20/16 0558 07/21/16 0328 07/25/16 0542  WBC 7.4 5.3 5.9  HGB 9.5* 9.3* 10.5*  HCT 29.4* 28.5* 31.6*  PLT 132* 138* 190  MCV 92.7 91.6 90.5  MCH 30.0 29.9 30.2  MCHC 32.3 32.6 33.4  RDW 17.4* 17.3* 17.2*   ------------------------------------------------------------------------------------------------------------------  Chemistries   Recent Labs Lab 07/20/16 0558 07/21/16 0328 07/25/16 0542  NA 137 141 136  K 4.5 4.8 3.7  CL 100* 97* 100*  CO2 34* 37* 31  GLUCOSE 94 154* 96  BUN 41* 53* 21*  CREATININE  0.95  0.90 0.70  CALCIUM 8.4* 8.5* 8.3*  AST 14*  --   --   ALT 9*  --   --   ALKPHOS 49  --   --   BILITOT 0.7  --   --    ------------------------------------------------------------------------------------------------------------------ estimated creatinine clearance is 103.3 mL/min (by C-G formula based on SCr of 0.7 mg/dL). ------------------------------------------------------------------------------------------------------------------ No results for input(s): TSH, T4TOTAL, T3FREE, THYROIDAB in the last 72 hours.  Invalid input(s): FREET3  Cardiac Enzymes No results for input(s): CKMB, TROPONINI, MYOGLOBIN in the last 168 hours.  Invalid input(s): CK ------------------------------------------------------------------------------------------------------------------ Invalid input(s): POCBNP ---------------------------------------------------------------------------------------------------------------  RADIOLOGY: No results found.  EKG:  Orders placed or performed during the hospital encounter of 04/16/16  . EKG 12-Lead  . EKG 12-Lead  . EKG 12-Lead  . EKG 12-Lead  . EKG 12-Lead  . EKG 12-Lead  . EKG 12-Lead  . EKG 12-Lead  . EKG    ASSESSMENT AND PLAN:  Active Problems:   Respiratory failure (HCC)  #1. Acute on chronic respiratory failure with hypoxia and hypercapnia, Due to COPD exacerbation, acute on chronic diastolic CHF, appreciate pulmonary input, continue budesonide,  arformoterol, tiotropium, albuterol. Give one dose of Lasix intravenously, follow ins and outs, clinically, I suspect that patient may need to be initiated on Lasix chronically due to diastolic CHF . Blood cultures are negative so far, MRSA PCR was negative. The patient is not on antibiotic #2. Leukocytosis, resolved #3. Marland Kitchen Thrombocytopenia, resolved #4 anemia, get Hemoccult if possible, stable since admission #5. Essential hypertension, discontinue Norvasc, initiate lisinopril, advance it as  needed #6. Acute on chronic diastolic CHF, given 1 dose of Lasix earlier today, follow ins and outs, initiate lisinopril, low-dose of metoprolol. The patient has diuresed about 2.2 L since admission  Management plans discussed with the patient, family and they are in agreement.   DRUG ALLERGIES:  Allergies  Allergen Reactions  . Ampicillin Swelling and Other (See Comments)    Unable to obtain enough information to answer additional questions about this medication.  Has patient had a PCN reaction causing immediate rash, facial/tongue/throat swelling, SOB or lightheadedness with hypotension: Yes Has patient had a PCN reaction causing severe rash involving mucus membranes or skin necrosis: No Has patient had a PCN reaction that required hospitalization No Has patient had a PCN reaction occurring within the last 10 years: Yes If all of the above answers are "NO", then may pr  . Nortriptyline Other (See Comments)    Sleep walking  . Brimonidine Other (See Comments)    Unknown reaction per MAR   . Cyclobenzaprine Other (See Comments)    Hallucinations  . Tizanidine Other (See Comments)    Hallucinations  . Gabapentin Rash    CODE STATUS:     Code Status Orders        Start     Ordered   07/16/16 1310  Full code  Continuous     07/16/16 1312    Code Status History    Date Active Date Inactive Code Status Order ID Comments User Context   03/09/2016  5:20 PM 03/15/2016 10:06 PM Full Code [id number]  Heide Spark Inpatient   03/03/2016  6:09 PM 03/09/2016  4:04 PM Full Code [id number]  Erin Fulling, MD ED   02/27/2016 10:41 PM 02/29/2016  7:53 PM Full Code [id number]  Oralia Manis, MD Inpatient   07/22/2015  7:30 PM 07/26/2015 12:16 AM Full Code [id number]  Adrian Saran, MD Inpatient   02/11/2015 10:28 AM 02/11/2015  6:15  PM Full Code [id number]  Nada Libman, MD Inpatient   08/04/2014  8:13 PM 08/13/2014  2:53 PM Full Code [id number]  Lynden Oxford, MD Inpatient    Advance Directive Documentation    Flowsheet Row Most Recent Value  Type of Advance Directive  Living will  Pre-existing out of facility DNR order (yellow form or pink MOST form)  No data  "MOST" Form in Place?  No data      TOTAL TIME TAKING CARE OF THIS PATIENT: 35 minutes.    Katharina Caper M.D on 07/26/2016 at 4:43 PM  Between 7am to 6pm - Pager - [phone]  After 6pm go to [email or web address] - password [email or web address] [facility]  [place]  [facility]  Office  [phone]  CC: Primary care physician; Kirt Boys, DO

## 2016-07-26 NOTE — Progress Notes (Signed)
Clinical Child psychotherapistocial Worker (CSW) presented bed offers to patient. He chose [facility]. CSW made patient aware that he has limited SNF days to use. CSW discussed long term care and applying for medicaid. Patient reported that he is not ready for long term care yet and just wants to go to [facility] for short term rehab. Doug admissions coordinator at [facility] is aware of accepted bed offer.   Baker Hughes IncorporatedBailey Talya Quain, LCSW [phone]

## 2016-07-26 NOTE — Progress Notes (Signed)
Checked available SNF days by calling [facility] [phone]).  Per automated system, as of the last billing date of 06/20/16, patient has 0 full SNF days and 30 co-SNF days available.

## 2016-07-26 NOTE — Progress Notes (Signed)
Patient is A&O x4. Up with assist x1 and WW. O2 At 2L, sats 94-96%. Chronic O2 at home.  Start BP meds this pm. Uses urinal at bedside. Up in chair most of shift. Lungs decreased throughout. Possible dc tomorrow.

## 2016-07-27 DIAGNOSIS — D696 Thrombocytopenia, unspecified: Secondary | ICD-10-CM

## 2016-07-27 DIAGNOSIS — I5033 Acute on chronic diastolic (congestive) heart failure: Secondary | ICD-10-CM

## 2016-07-27 DIAGNOSIS — D649 Anemia, unspecified: Secondary | ICD-10-CM

## 2016-07-27 DIAGNOSIS — D72829 Elevated white blood cell count, unspecified: Secondary | ICD-10-CM

## 2016-07-27 MED ORDER — PREDNISONE 20 MG PO TABS: 20.0000 mg | ORAL_TABLET | Freq: Every day | ORAL | 6 refills | Status: DC

## 2016-07-27 MED ORDER — IPRATROPIUM-ALBUTEROL 0.5-2.5 (3) MG/3ML IN SOLN
3.0000 mL | RESPIRATORY_TRACT | Status: DC
  Administered 2016-07-27 (×2): 3 mL via RESPIRATORY_TRACT
  Filled 2016-07-27 (×2): qty 3

## 2016-07-27 MED ORDER — LISINOPRIL 20 MG PO TABS
20.0000 mg | ORAL_TABLET | Freq: Two times a day (BID) | ORAL | Status: DC
Start: 2016-07-27 — End: 2016-07-27

## 2016-07-27 MED ORDER — CLONAZEPAM 0.5 MG PO TABS: 0.5000 mg | ORAL_TABLET | Freq: Two times a day (BID) | ORAL | 0 refills | Status: DC

## 2016-07-27 MED ORDER — LISINOPRIL 20 MG PO TABS: 20.0000 mg | ORAL_TABLET | Freq: Two times a day (BID) | ORAL | 6 refills | Status: DC

## 2016-07-27 MED ORDER — TIOTROPIUM BROMIDE MONOHYDRATE 18 MCG IN CAPS: 18.0000 ug | ORAL_CAPSULE | Freq: Every day | RESPIRATORY_TRACT | 12 refills | Status: DC

## 2016-07-27 MED ORDER — ALBUTEROL SULFATE (2.5 MG/3ML) 0.083% IN NEBU: 2.5000 mg | INHALATION_SOLUTION | RESPIRATORY_TRACT | 12 refills | Status: DC

## 2016-07-27 MED ORDER — OXYCODONE HCL 5 MG PO TABS
5.0000 mg | ORAL_TABLET | Freq: Three times a day (TID) | ORAL | 0 refills | Status: DC
Start: 2016-07-27 — End: 2016-08-23

## 2016-07-27 MED ORDER — FUROSEMIDE 20 MG PO TABS
20.0000 mg | ORAL_TABLET | Freq: Every day | ORAL | Status: DC
  Administered 2016-07-27: 20 mg via ORAL
  Filled 2016-07-27: qty 1

## 2016-07-27 MED ORDER — POTASSIUM CHLORIDE CRYS ER 20 MEQ PO TBCR
20.0000 meq | EXTENDED_RELEASE_TABLET | Freq: Every day | ORAL | Status: DC
  Administered 2016-07-27: 20 meq via ORAL
  Filled 2016-07-27: qty 1

## 2016-07-27 MED ORDER — METOPROLOL TARTRATE 25 MG PO TABS: 25.0000 mg | ORAL_TABLET | Freq: Two times a day (BID) | ORAL | 6 refills | Status: AC

## 2016-07-27 NOTE — Discharge Summary (Signed)
[facility] - [place] at [facility]   PATIENT NAME: Chad Huffman    MR#:  [id number]  DATE OF BIRTH:  June 23, 1945  DATE OF ADMISSION:  07/16/2016 ADMITTING PHYSICIAN: Erin Fulling, MD  DATE OF DISCHARGE: 07/27/2016  PRIMARY CARE PHYSICIAN: Kirt Boys, DO     ADMISSION DIAGNOSIS:  Pain [R52] Acute respiratory failure with hypercapnia (HCC) [J96.02] Acute encephalopathy [G93.40]  DISCHARGE DIAGNOSIS:  Active Problems:   Essential hypertension   COPD exacerbation (HCC)   Acute on chronic respiratory failure with hypoxia and hypercapnia (HCC)   Acute on chronic diastolic CHF (congestive heart failure) (HCC)   Leukocytosis   Thrombocytopenia (HCC)   Anemia   SECONDARY DIAGNOSIS:   Past Medical History:  Diagnosis Date  . Antalgic gait   . Carotid artery occlusion   . Chronic neck and back pain   . Complication of anesthesia    pt has had cervical fusion-limited neck flextion  . COPD (chronic obstructive pulmonary disease) (HCC)   . Depression   . DJD (degenerative joint disease)    Right shoulder  . Hypertension   . Lumbago   . Peripheral neuropathy (HCC)     .pro HOSPITAL COURSE:   The patient is a 71 year old male with past topical history significant for history of chronic respiratory failure/COPD, who was recently hospitalized, which required the trach tube placement, status post skilled nursing facility/rehabilitation facility placement, recently discharged to home approximately week ago, who presents to the hospital with unresponsive episode. He was noted to have severe hypercapnia and was reintubated. In emergency room. On admission he had echocardiogram, revealing ejection fraction of 60%, markedly dilated LA and RV, grade 1 diastolic dysfunction, extubated on January 31. Diet was advanced, transferred to medical floor. Patient feels satisfactory today, still some  shortness of breath , no cough or phlegm production. Admits of lower  extremity, arm swelling. Chest x-ray on 07/23/2016 revealed right basilar atelectasis , however, showed interstitial edema and bilateral pleural effusions on admission. The patient was diuresed approximately 2.4 L during his stay in the hospital time, he felt progressively better. He was seen by physical therapist and recommended skilled nursing facility placement for rehabilitation for which she was agreeable. He is going to be discharged today Discussion by problem: #1. Acute on chronic respiratory failure with hypoxia and hypercapnia, Due to COPD exacerbation, acute on chronic diastolic CHF, appreciate pulmonary input, continue budesonide,   duo nebs, tiotropium. . Continue torsemide, following ins and outs, patient's weight, clinically . Blood cultures were negative so far, MRSA PCR was negative.  He had no fevers for at least 5 days prior to discharge. Influenza test was negative. It is recommended to follow patient's weight closely, he may benefit from pulmonary and cardiology follow-up as outpatient. Patient was initiated on ACE inhibitor, lisinopril which she was advanced to current dose, it is recommended to advance medication to control his blood pressure well. Patient was also started on metoprolol. Echocardiogram done during this admission revealed normal ejection fraction, mild diastolic dysfunction, mild MR and TR. #2. Leukocytosis, resolved #3. Marland Kitchen Thrombocytopenia, resolved #4 anemia, Hemoccult  was ordered, not received the, it is recommended to be done as outpatient to rule out chronic GI bleed , stable since admission #5. Essential hypertension, continue metoprolol and lisinopril, advance as needed #6 Acute on chronic diastolic CHF, . Continue  lisinopril, low-dose of metoprolol. continue torsemide. The following ins and outs, weight  The patient has diuresed about 2.4 L since admission. He is  to continue oxygen therapy, now on 2 L with good O2 sats.    CONSULTS OBTAINED:    DRUG  ALLERGIES:   Allergies  Allergen Reactions  . Ampicillin Swelling and Other (See Comments)    Unable to obtain enough information to answer additional questions about this medication.  Has patient had a PCN reaction causing immediate rash, facial/tongue/throat swelling, SOB or lightheadedness with hypotension: Yes Has patient had a PCN reaction causing severe rash involving mucus membranes or skin necrosis: No Has patient had a PCN reaction that required hospitalization No Has patient had a PCN reaction occurring within the last 10 years: Yes If all of the above answers are "NO", then may pr  . Nortriptyline Other (See Comments)    Sleep walking  . Brimonidine Other (See Comments)    Unknown reaction per MAR   . Cyclobenzaprine Other (See Comments)    Hallucinations  . Tizanidine Other (See Comments)    Hallucinations  . Gabapentin Rash    DISCHARGE MEDICATIONS:   Current Discharge Medication List    START taking these medications   Details  lisinopril (PRINIVIL,ZESTRIL) 20 MG tablet Take 1 tablet (20 mg total) by mouth 2 (two) times daily. Qty: 60 tablet, Refills: 6    metoprolol tartrate (LOPRESSOR) 25 MG tablet Take 1 tablet (25 mg total) by mouth 2 (two) times daily. Qty: 60 tablet, Refills: 6    tiotropium (SPIRIVA) 18 MCG inhalation capsule Place 1 capsule (18 mcg total) into inhaler and inhale daily. Qty: 30 capsule, Refills: 12      CONTINUE these medications which have CHANGED   Details  predniSONE (DELTASONE) 20 MG tablet Take 1 tablet (20 mg total) by mouth daily with breakfast. Qty: 30 tablet, Refills: 6      CONTINUE these medications which have NOT CHANGED   Details  budesonide-formoterol (SYMBICORT) 160-4.5 MCG/ACT inhaler Inhale 2 puffs into the lungs 2 (two) times daily.    budesonide (PULMICORT) 0.5 MG/2ML nebulizer solution Take 0.5 mg by nebulization 2 (two) times daily.    clonazePAM (KLONOPIN) 0.5 MG tablet Place 0.5 mg into feeding tube 2 (two)  times daily.     docusate sodium (COLACE) 100 MG capsule Take 100 mg by mouth 2 (two) times daily.     enoxaparin (LOVENOX) 40 MG/0.4ML injection Inject 40 mg into the skin daily.    famotidine (PEPCID) 20 MG tablet Place 20 mg into feeding tube 2 (two) times daily.     folic acid (FOLVITE) 1 MG tablet Place 1 mg into feeding tube daily.     ipratropium-albuterol (DUONEB) 0.5-2.5 (3) MG/3ML SOLN Take 3 mLs by nebulization 3 (three) times daily.    metoCLOPramide (REGLAN) 5 MG tablet Place 2.5 mg into feeding tube every 6 (six) hours.    Multiple Vitamin (MULTIVITAMIN WITH MINERALS) TABS tablet Take 1 tablet by mouth daily.    !! Nutritional Supplements (FEEDING SUPPLEMENT, GLUCERNA 1.5 CAL,) LIQD Place 60 mL/hr into feeding tube continuous.    !! Nutritional Supplements (NUTRITIONAL DRINK) LIQD Take 1 Bottle by mouth 2 (two) times daily. For Prophylaxis    oxyCODONE (OXY IR/ROXICODONE) 5 MG immediate release tablet Place 5 mg into feeding tube every 8 (eight) hours.     OXYGEN 4 L/min by Intratracheal route as needed (for shortness of breath).     QUEtiapine (SEROQUEL) 50 MG tablet Place 100 mg into feeding tube at bedtime.     sertraline (ZOLOFT) 50 MG tablet Place 75 mg into feeding tube daily.  torsemide (DEMADEX) 20 MG tablet Place 20 mg into feeding tube daily.      !! - Potential duplicate medications found. Please discuss with provider.       DISCHARGE INSTRUCTIONS:    The patient is to follow-up with primary care physician, pulmonologist, cardiologist as outpatient   If you experience worsening of your admission symptoms, develop shortness of breath, life threatening emergency, suicidal or homicidal thoughts you must seek medical attention immediately by calling 911 or calling your MD immediately  if symptoms less severe.  You Must read complete instructions/literature along with all the possible adverse reactions/side effects for all the Medicines you take and  that have been prescribed to you. Take any new Medicines after you have completely understood and accept all the possible adverse reactions/side effects.   Please note  You were cared for by a hospitalist during your hospital stay. If you have any questions about your discharge medications or the care you received while you were in the hospital after you are discharged, you can call the unit and asked to speak with the hospitalist on call if the hospitalist that took care of you is not available. Once you are discharged, your primary care physician will handle any further medical issues. Please note that NO REFILLS for any discharge medications will be authorized once you are discharged, as it is imperative that you return to your primary care physician (or establish a relationship with a primary care physician if you do not have one) for your aftercare needs so that they can reassess your need for medications and monitor your lab values.    Today   CHIEF COMPLAINT:   Chief Complaint  Patient presents with  . Altered Mental Status    HISTORY OF PRESENT ILLNESS:  Sahid Borba  is a 71 y.o. male with a known history of chronic respiratory failure/COPD, who was recently hospitalized, which required the trach tube placement, status post skilled nursing facility/rehabilitation facility placement, recently discharged to home approximately week ago, who presents to the hospital with unresponsive episode. He was noted to have severe hypercapnia and was reintubated. In emergency room. On admission he had echocardiogram, revealing ejection fraction of 60%, markedly dilated LA and RV, grade 1 diastolic dysfunction, extubated on January 31. Diet was advanced, transferred to medical floor. Patient feels satisfactory today, still some  shortness of breath , no cough or phlegm production. Admits of lower extremity, arm swelling. Chest x-ray on 07/23/2016 revealed right basilar atelectasis , however, showed  interstitial edema and bilateral pleural effusions on admission. The patient was diuresed approximately 2.4 L during his stay in the hospital time, he felt progressively better. He was seen by physical therapist and recommended skilled nursing facility placement for rehabilitation for which she was agreeable. He is going to be discharged today Discussion by problem: #1. Acute on chronic respiratory failure with hypoxia and hypercapnia, Due to COPD exacerbation, acute on chronic diastolic CHF, appreciate pulmonary input, continue budesonide,   duo nebs, tiotropium. . Continue torsemide, following ins and outs, patient's weight, clinically . Blood cultures were negative so far, MRSA PCR was negative.  He had no fevers for at least 5 days prior to discharge. Influenza test was negative. It is recommended to follow patient's weight closely, he may benefit from pulmonary and cardiology follow-up as outpatient. Patient was initiated on ACE inhibitor, lisinopril which she was advanced to current dose, it is recommended to advance medication to control his blood pressure well. Patient was also started  on metoprolol. Echocardiogram done during this admission revealed normal ejection fraction, mild diastolic dysfunction, mild MR and TR. #2. Leukocytosis, resolved #3. Marland Kitchen Thrombocytopenia, resolved #4 anemia, Hemoccult  was ordered, not received the, it is recommended to be done as outpatient to rule out chronic GI bleed , stable since admission #5. Essential hypertension, continue metoprolol and lisinopril, advance as needed #6 Acute on chronic diastolic CHF, . Continue  lisinopril, low-dose of metoprolol. continue torsemide. The following ins and outs, weight  The patient has diuresed about 2.4 L since admission. He is to continue oxygen therapy, now on 2 L with good O2 sats.      VITAL SIGNS:  Blood pressure (!) 153/63, pulse 63, temperature 98.4 F (36.9 C), temperature source Oral, resp. rate 16, height 5\' 10"   (1.778 m), weight 97.3 kg (214 lb 6.4 oz), SpO2 98 %.  I/O:    Intake/Output Summary (Last 24 hours) at 07/27/16 1036 Last data filed at 07/27/16 0900  Gross per 24 hour  Intake              480 ml  Output              700 ml  Net             -220 ml    PHYSICAL EXAMINATION:  GENERAL:  71 y.o.-year-old patient lying in the bed with no acute distress.  EYES: Pupils equal, round, reactive to light and accommodation. No scleral icterus. Extraocular muscles intact.  HEENT: Head atraumatic, normocephalic. Oropharynx and nasopharynx clear.  NECK:  Supple, no jugular venous distention. No thyroid enlargement, no tenderness.  LUNGS: Normal breath sounds bilaterally, no wheezing, rales,rhonchi or crepitation. No use of accessory muscles of respiration.  CARDIOVASCULAR: S1, S2 normal. No murmurs, rubs, or gallops.  ABDOMEN: Soft, non-tender, non-distended. Bowel sounds present. No organomegaly or mass.  EXTREMITIES: No pedal edema, cyanosis, or clubbing.  NEUROLOGIC: Cranial nerves II through XII are intact. Muscle strength 5/5 in all extremities. Sensation intact. Gait not checked.  PSYCHIATRIC: The patient is alert and oriented x 3.  SKIN: No obvious rash, lesion, or ulcer.   DATA REVIEW:   CBC  Recent Labs Lab 07/25/16 0542  WBC 5.9  HGB 10.5*  HCT 31.6*  PLT 190    Chemistries   Recent Labs Lab 07/25/16 0542  NA 136  K 3.7  CL 100*  CO2 31  GLUCOSE 96  BUN 21*  CREATININE 0.70  CALCIUM 8.3*    Cardiac Enzymes No results for input(s): TROPONINI in the last 168 hours.  Microbiology Results  Results for orders placed or performed during the hospital encounter of 07/16/16  MRSA PCR Screening     Status: None   Collection Time: 07/16/16  5:28 PM  Result Value Ref Range Status   MRSA by PCR NEGATIVE NEGATIVE Final    Comment:        The GeneXpert MRSA Assay (FDA approved for NASAL specimens only), is one component of a comprehensive MRSA  colonization surveillance program. It is not intended to diagnose MRSA infection nor to guide or monitor treatment for MRSA infections.   Blood culture (routine x 2)     Status: None   Collection Time: 07/16/16  5:44 PM  Result Value Ref Range Status   Specimen Description BLOOD  RIGHT FOREARM  Final   Special Requests   Final    BOTTLES DRAWN AEROBIC AND ANAEROBIC  AER 8 ML ANA 7 ML   Culture NO  GROWTH 5 DAYS  Final   Report Status 07/21/2016 FINAL  Final  Blood culture (routine x 2)     Status: None   Collection Time: 07/16/16  5:44 PM  Result Value Ref Range Status   Specimen Description BLOOD  RIGHT AC  Final   Special Requests   Final    BOTTLES DRAWN AEROBIC AND ANAEROBIC  AER 5 ML ANA 8 ML    Culture NO GROWTH 5 DAYS  Final   Report Status 07/21/2016 FINAL  Final    RADIOLOGY:  No results found.  EKG:   Orders placed or performed during the hospital encounter of 04/16/16  . EKG 12-Lead  . EKG 12-Lead  . EKG 12-Lead  . EKG 12-Lead  . EKG 12-Lead  . EKG 12-Lead  . EKG 12-Lead  . EKG 12-Lead  . EKG      Management plans discussed with the patient, family and they are in agreement.  CODE STATUS:     Code Status Orders        Start     Ordered   07/16/16 1310  Full code  Continuous     07/16/16 1312    Code Status History    Date Active Date Inactive Code Status Order ID Comments User Context   03/09/2016  5:20 PM 03/15/2016 10:06 PM Full Code [id number]  Heide Spark Inpatient   03/03/2016  6:09 PM 03/09/2016  4:04 PM Full Code [id number]  Erin Fulling, MD ED   02/27/2016 10:41 PM 02/29/2016  7:53 PM Full Code [id number]  Oralia Manis, MD Inpatient   07/22/2015  7:30 PM 07/26/2015 12:16 AM Full Code [id number]  Adrian Saran, MD Inpatient   02/11/2015 10:28 AM 02/11/2015  6:15 PM Full Code [id number]  Nada Libman, MD Inpatient   08/04/2014  8:13 PM 08/13/2014  2:53 PM Full Code [id number]  Lynden Oxford, MD Inpatient    Advance Directive Documentation   Flowsheet Row  Most Recent Value  Type of Advance Directive  Living will  Pre-existing out of facility DNR order (yellow form or pink MOST form)  No data  "MOST" Form in Place?  No data      TOTAL TIME TAKING CARE OF THIS PATIENT:62minutes.    Katharina Caper M.D on 07/27/2016 at 10:36 AM  Between 7am to 6pm - Pager - [phone]  After 6pm go to [email or web address] - password [email or web address] [facility]  [facility]  Office  [phone]  CC: Primary care physician; Kirt Boys, DO

## 2016-07-27 NOTE — [place] FL2 (Signed)
[place] MEDICAID FL2 LEVEL OF CARE SCREENING TOOL     IDENTIFICATION  Patient Name: Chad Huffman Birthdate: 1946-05-24 Sex: male Admission Date (Current Location): 07/16/2016  [place] and [place] Number:  Chiropodist and Address:  [facility] [address] [place], [place] [address]      Provider Number: [id number]  Attending Physician Name and Address:  Katharina Caper, MD  Relative Name and Phone Number:       Current Level of Care: Hospital Recommended Level of Care: Skilled Nursing Facility Prior Approval Number:    Date Approved/Denied:   PASRR Number: [id number]  Discharge Plan: SNF    Current Diagnoses: Patient Active Problem List   Diagnosis Date Noted  . Respiratory failure (HCC) 07/16/2016  . 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 Dysphagia, pharyngoesophageal phase 05/18/2016  . GERD without esophagitis 05/09/2016  . Bilateral lower extremity edema 05/09/2016  . Slow transit constipation 05/09/2016  . Depression with anxiety 05/09/2016  . Respiratory failure with hypoxia and hypercapnia (HCC) 07/22/2015  . Cervical vertebral fusion   . Depression   . Chronic neck and back pain   . Acute on chronic respiratory failure with hypercapnia (HCC)   . COPD exacerbation (HCC)   . HCAP (healthcare-associated pneumonia)   . Congestive dilated cardiomyopathy (HCC)   . Syncope and collapse   . Arthritis of right hip 08/04/2014  . Essential hypertension 08/04/2014  . Hypothyroidism 08/04/2014  . Syncope 08/04/2014  . Fall 08/04/2014    Orientation RESPIRATION BLADDER Height & Weight     Self, Time, Situation, Place  O2 (3L o2) Continent Weight: 214 lb 6.4 oz (97.3 kg) Height:  5\' 10"  (177.8 cm)  BEHAVIORAL SYMPTOMS/MOOD NEUROLOGICAL BOWEL NUTRITION STATUS      Continent Diet (Diet heart healthy/carb modified )  AMBULATORY STATUS COMMUNICATION OF NEEDS Skin    Extensive Assist Verbally Normal                       Personal Care Assistance Level of Assistance  Bathing, Feeding, Dressing Bathing Assistance: Limited assistance Feeding assistance: Independent Dressing Assistance: Limited assistance     Functional Limitations Info             SPECIAL CARE FACTORS FREQUENCY  PT (By licensed PT)     PT Frequency: Up to 5 X per day, 5 days per week OT Frequency: Up to 5 X per day, 5 days per week            Contractures Contractures Info: Present    Additional Factors Info  Allergies   Allergies Info: Ampicillin, Nortriptyline, Brimonidine, Cyclobenzaprine, Tizanidine, Gabapentin           Current Medications (07/27/2016):  This is the current hospital active medication list Current Facility-Administered Medications  Medication Dose Route Frequency Provider Last Rate Last Dose  . acetaminophen (TYLENOL) tablet 650 mg  650 mg Oral Q6H PRN Eugenie Norrie, NP   650 mg at 07/24/16 2149  . arformoterol (BROVANA) nebulizer solution 15 mcg  15 mcg Nebulization BID Merwyn Katos, MD   15 mcg at 07/27/16 0725  . budesonide (PULMICORT) nebulizer solution 0.5 mg  0.5 mg Nebulization BID Merwyn Katos, MD   0.5 mg at 07/27/16 5621  . enoxaparin (LOVENOX) injection 40 mg  40 mg Subcutaneous Q24H Erin Fulling, MD   40 mg at 07/26/16 2053  . furosemide (LASIX) tablet 20 mg  20 mg Oral Daily Katharina Caperima Koya Hunger, MD      . ipratropium-albuterol (DUONEB) 0.5-2.5 (3) MG/3ML nebulizer solution 3 mL  3 mL Nebulization Q4H Katharina Caperima Aleyssa Pike, MD      . lisinopril (PRINIVIL,ZESTRIL) tablet 10 mg  10 mg Oral BID Katharina Caperima Janin Kozlowski, MD   10 mg at 07/27/16 0758  . metoprolol tartrate (LOPRESSOR) tablet 25 mg  25 mg Oral BID Katharina Caperima Rahma Meller, MD   25 mg at 07/27/16 0759  . potassium chloride SA (K-DUR,KLOR-CON) CR tablet 20 mEq  20 mEq Oral Daily Katharina Caperima Zaylynn Rickett, MD      . predniSONE (DELTASONE) tablet 20 mg  20 mg Oral Q breakfast Merwyn Katosavid B Simonds, MD   20 mg at  07/27/16 0756  . tiotropium (SPIRIVA) inhalation capsule 18 mcg  18 mcg Inhalation Daily Merwyn Katosavid B Simonds, MD   18 mcg at 07/27/16 0756  . traZODone (DESYREL) tablet 50 mg  50 mg Oral QHS PRN Gwendolyn FillBincy S Varughese, NP   50 mg at 07/22/16 2225     Discharge Medications: Please see discharge summary for a list of discharge medications.  Relevant Imaging Results:  Relevant Lab Results:   Additional Information SS# [id number]  Sample, Darleen CrockerBailey M, LCSW

## 2016-07-27 NOTE — Progress Notes (Signed)
Physical Therapy Treatment Patient Details Name: Chad LabradorDanny L Schneiderman MRN: [id number] DOB: 1945-09-29 Today's Date: 07/27/2016    History of Present Illness pt is a 71 yo male presented to hospital w/ acute respiratory failure; was incubated but now on 3L O2 nasal cannula. Has a history of; COPD, peripheral neuropathy, Cervical fusions, R rotator cuff surgery.     PT Comments    Pt is making good progress towards goals and continues to be motivated to perform therapy. Still needs assist for all mobility. Needs cues for safety and balance during mobility. +2 for safety as pt unsteady during ambulation. All mobility performed with 2L of O2 with sats decreasing to 85% with exertion, quickly improving to 90% with rest break. Will continue to progress.  Follow Up Recommendations  SNF     Equipment Recommendations       Recommendations for Other Services       Precautions / Restrictions Precautions Precautions: Fall Restrictions Weight Bearing Restrictions: No    Mobility  Bed Mobility Overal bed mobility: Needs Assistance Bed Mobility: Supine to Sit     Supine to sit: Min assist     General bed mobility comments: assist for sliding B LE off bed and scooting out towards edge. Pt able to sit with upright posture  Transfers Overall transfer level: Needs assistance Equipment used: Rolling walker (2 wheeled) Transfers: Sit to/from Stand Sit to Stand: Min assist         General transfer comment: assist for upright posture and correct hand placement. Slight unsteadiness with inital stance  Ambulation/Gait Ambulation/Gait assistance: Min assist;+2 safety/equipment Ambulation Distance (Feet): 200 Feet Assistive device: Rolling walker (2 wheeled) Gait Pattern/deviations: Step-through pattern;Scissoring     General Gait Details: Pt with occasional cross over midline on R LE. Constantly needs cues to slow down gait as he loses balance mutliple times requiring min assist for  correction. +2 assist following with WC for safety. Pt tends to let RW get too far away from him, needs cues to keep it closer for balance. Good endurance noted with no rest break required.    Stairs            Wheelchair Mobility    Modified Rankin (Stroke Patients Only)       Balance                                    Cognition Arousal/Alertness: Awake/alert Behavior During Therapy: WFL for tasks assessed/performed Overall Cognitive Status: Within Functional Limits for tasks assessed                      Exercises Other Exercises Other Exercises: [facility]. Needs cues for safety and sequencing as pt in a hurry. Slight unsteadiness noted. Needs mod assist for hygiene and cleaning.    General Comments        Pertinent Vitals/Pain Pain Assessment: No/denies pain    Home Living                      Prior Function            PT Goals (current goals can now be found in the care plan section) Acute Rehab PT Goals Patient Stated Goal: Go to rehab and improve strength PT Goal Formulation: With patient Time For Goal Achievement: 08/05/16 Potential to Achieve Goals: Good Progress towards PT goals:  Progressing toward goals    Frequency    Min 2X/week      PT Plan Current plan remains appropriate    Co-evaluation             End of Session Equipment Utilized During Treatment: Gait belt Activity Tolerance: Patient tolerated treatment well Patient left: in chair;with call bell/phone within reach;with chair alarm set     Time: [phone] PT Time Calculation (min) (ACUTE ONLY): 40 min  Charges:  $Gait Training: 23-37 mins $Therapeutic Activity: 8-22 mins                    G Codes:      Ryenne Lynam Aug 22, 2016, 11:51 AM  Elizabeth Palau, PT, DPT [phone]

## 2016-07-27 NOTE — Clinical Social Work Placement (Signed)
   CLINICAL SOCIAL WORK PLACEMENT  NOTE  Date:  07/27/2016  Patient Details  Name: Chad Huffman MRN: [id number] Date of Birth: [birth year]  Clinical Social Work is seeking post-discharge placement for this patient at the Skilled  Nursing Facility level of care (*CSW will initial, date and re-position this form in  chart as items are completed):  Yes   Patient/family provided with [facility] Clinical Social Work Department's list of facilities offering this level of care within the geographic area requested by the patient (or if unable, by the patient's family).  Yes   Patient/family informed of their freedom to choose among providers that offer the needed level of care, that participate in Medicare, Medicaid or managed care program needed by the patient, have an available bed and are willing to accept the patient.  Yes   Patient/family informed of [facility]'s ownership interest in [facility] and [facility], as well as of the fact that they are under no obligation to receive care at these facilities.  PASRR submitted to EDS on       PASRR number received on       Existing PASRR number confirmed on 07/25/16     FL2 transmitted to all facilities in geographic area requested by pt/family on 07/25/16     FL2 transmitted to all facilities within larger geographic area on       Patient informed that his/her managed care company has contracts with or will negotiate with certain facilities, including the following:        Yes   Patient/family informed of bed offers received.  Patient chooses bed at  [facility])     Physician recommends and patient chooses bed at      Patient to be transferred to  [facility]) on 07/27/16.  Patient to be transferred to facility by  [facility])     Patient family notified on 07/27/16 of transfer.  Name of family member notified:   (Patient's brother Chad Huffman is aware of D/C today. )      PHYSICIAN       Additional Comment:    _______________________________________________ York Valliant, Darleen CrockerBailey M, LCSW 07/27/2016, 2:45 PM

## 2016-07-27 NOTE — Progress Notes (Signed)
Patient is medically stable for D/C to [facility] today. [facility] to room 29-A. RN will call report and arrange EMS for transport. Clinical Child psychotherapistocial Worker (CSW) sent D/C orders to [facility] via [employer]. Patient is aware of above. CSW contacted patient's brother [name] and made him aware of above. Please reconsult if future social work needs arise. CSW signing off.   [employer] Hughes IncorporatedBailey Mirayah Wren, LCSW [phone]

## 2016-07-27 NOTE — Progress Notes (Signed)
EMS here to transport pt. 

## 2016-07-27 NOTE — Care Management Important Message (Signed)
Important Message  Patient Details  Name: Chad Huffman MRN: [id number] Date of Birth: [birth year]   Medicare Important Message Given:  Yes    Marily MemosLisa M Nachum Derossett, RN 07/27/2016, 10:43 AM

## 2016-07-27 NOTE — Progress Notes (Signed)
Report called to [facility] [place] [facility] at [facility]. EMS called for transportation.

## 2016-07-29 ENCOUNTER — Other Ambulatory Visit: Payer: Self-pay | Admitting: *Deleted

## 2016-07-29 DIAGNOSIS — R0600 Dyspnea, unspecified: Secondary | ICD-10-CM

## 2016-08-16 ENCOUNTER — Ambulatory Visit (INDEPENDENT_AMBULATORY_CARE_PROVIDER_SITE_OTHER): Payer: Medicare Other | Admitting: Pulmonary Disease

## 2016-08-16 ENCOUNTER — Encounter: Payer: Self-pay | Admitting: Pulmonary Disease

## 2016-08-16 ENCOUNTER — Ambulatory Visit
Admission: RE | Admit: 2016-08-16 | Discharge: 2016-08-16 | Disposition: A | Discharge status: Home / Self Care | Payer: Medicare Other | Source: Ambulatory Visit | Attending: Pulmonary Disease | Admitting: Pulmonary Disease

## 2016-08-16 VITALS — BP 134/78 | HR 81 | Ht 68.5 in | Wt 208.0 lb

## 2016-08-16 DIAGNOSIS — J9611 Chronic respiratory failure with hypoxia: Secondary | ICD-10-CM

## 2016-08-16 DIAGNOSIS — R0600 Dyspnea, unspecified: Secondary | ICD-10-CM

## 2016-08-16 DIAGNOSIS — J9612 Chronic respiratory failure with hypercapnia: Secondary | ICD-10-CM

## 2016-08-16 DIAGNOSIS — J449 Chronic obstructive pulmonary disease, unspecified: Secondary | ICD-10-CM | POA: Diagnosis not present

## 2016-08-16 DIAGNOSIS — T884XXD Failed or difficult intubation, subsequent encounter: Secondary | ICD-10-CM

## 2016-08-16 DIAGNOSIS — J9502 Infection of tracheostomy stoma: Secondary | ICD-10-CM

## 2016-08-16 DIAGNOSIS — I700 Atherosclerosis of aorta: Secondary | ICD-10-CM

## 2016-08-16 DIAGNOSIS — J986 Disorders of diaphragm: Secondary | ICD-10-CM

## 2016-08-16 NOTE — Patient Instructions (Signed)
1) Continue Symibcort and Spiriva as your maintenance medications 2) Continue Duoneb as your rescue medication to be used as needed 3) continue oxygen with exertion and sleep 4) Referral made to Dr Willeen CassBennett to take a look at your trach site stoma 5) Change Reglan (metoclopramide) to as needed only for nausea 6) Change Clonazepam to as needed only for anxiety 7) Follow up in 3 months

## 2016-08-18 NOTE — Progress Notes (Signed)
PULMONARY POST HOSPITAL OFFICE FOLLOW UP NOTE   PROBLEMS:  Former smoker Very severe COPD Difficult intubation Status post cervical spine fusion Chronically elevated right hemidiaphragm Prolonged hospitalization September 2017 to January 2018  Underwent emergency tracheostomy tube placement 03/03/2016  [facility]  G tube removed 07/02/16  [facility]/2017  Decannulated in early Jan 2018  Discharged to home 07/05/16 Re-admitted 07/16/16 with acute respiratory failure due to COPD with hypercarbic resp failure  Extubated 07/21/16    DATA: CT chest 03/23/16: Right hemidiaphragm is elevated. No pleural fluid on the left. Minimal hazy infiltrate present in the apex of the left upper lobe. The right chest shows a small effusion largely layering dependently. There is extensive airspace filling in the upper lobe consistent with bronchopneumonia. There is patchy density in the right lower lobe in the right middle lobe that could be atelectasis or lesser bronchopneumonia. The appearance is improved since the CT of 03/03/16 Echocardiogram 07/17/16: grade I diastolic dysfunction. LVEF 60%. LA mildly dilated. RV mildly dilated  INTERVAL HISTORY: No major events since discharge  SUBJ: This is a routine post hospital follow-up. His discharge, [name] has done reasonably well. He continues to have moderately limiting shortness of breath. However he also has significant neurologic impairment which limits his exertional capacity. He describes pain around the tracheostomy stoma site with scant purulent drainage. The tracheostomy tube was placed by Dr. Willeen CassBennett. Denies CP, fever, purulent sputum, hemoptysis, LE edema and calf tenderness.   OBJ: Vitals:   08/16/16 1451  BP: 134/78  Pulse: 81  SpO2: 91%  Weight: 208 lb (94.3 kg)  Height: 5' 8.5" (1.74 m)  Room air  HEENT are normal. Neck exhibits very limited mobility. The tracheostomy stoma is mildly  erythematous. It does not appear to be patent all the way through to the trachea. There is no significant drainage noted. Chest examination reveals moderately diminished breath sounds without wheezes or other adventitious sounds Cardiac exam reveals a regular rate and rhythm with no murmurs noted. Abdomen is soft nontender normal bowel sounds. Extremities are without clubbing cyanosis or edema   DATA: Chest x-ray 08/16/2016: Chronically elevated right hemidiaphragm with right basilar atelectasis and otherwise no acute findings.  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

## 2016-08-19 ENCOUNTER — Emergency Department: Payer: Medicare Other

## 2016-08-19 ENCOUNTER — Inpatient Hospital Stay
Admission: EM | Admit: 2016-08-19 | Discharge: 2016-08-23 | DRG: 190 | Disposition: A | Discharge status: Skilled Nursing Facility | Payer: Medicare Other | Attending: Internal Medicine | Admitting: Internal Medicine

## 2016-08-19 DIAGNOSIS — Z9981 Dependence on supplemental oxygen: Secondary | ICD-10-CM | POA: Diagnosis not present

## 2016-08-19 DIAGNOSIS — M542 Cervicalgia: Secondary | ICD-10-CM | POA: Diagnosis present

## 2016-08-19 DIAGNOSIS — G629 Polyneuropathy, unspecified: Secondary | ICD-10-CM | POA: Diagnosis present

## 2016-08-19 DIAGNOSIS — D61818 Other pancytopenia: Secondary | ICD-10-CM | POA: Diagnosis present

## 2016-08-19 DIAGNOSIS — Z87891 Personal history of nicotine dependence: Secondary | ICD-10-CM

## 2016-08-19 DIAGNOSIS — E875 Hyperkalemia: Secondary | ICD-10-CM | POA: Diagnosis present

## 2016-08-19 DIAGNOSIS — R55 Syncope and collapse: Secondary | ICD-10-CM

## 2016-08-19 DIAGNOSIS — G4700 Insomnia, unspecified: Secondary | ICD-10-CM | POA: Diagnosis present

## 2016-08-19 DIAGNOSIS — I5033 Acute on chronic diastolic (congestive) heart failure: Secondary | ICD-10-CM | POA: Diagnosis present

## 2016-08-19 DIAGNOSIS — N179 Acute kidney failure, unspecified: Secondary | ICD-10-CM | POA: Diagnosis present

## 2016-08-19 DIAGNOSIS — J9601 Acute respiratory failure with hypoxia: Secondary | ICD-10-CM

## 2016-08-19 DIAGNOSIS — J9622 Acute and chronic respiratory failure with hypercapnia: Secondary | ICD-10-CM | POA: Diagnosis not present

## 2016-08-19 DIAGNOSIS — M545 Low back pain: Secondary | ICD-10-CM | POA: Diagnosis present

## 2016-08-19 DIAGNOSIS — Z7982 Long term (current) use of aspirin: Secondary | ICD-10-CM | POA: Diagnosis not present

## 2016-08-19 DIAGNOSIS — J441 Chronic obstructive pulmonary disease with (acute) exacerbation: Secondary | ICD-10-CM | POA: Diagnosis present

## 2016-08-19 DIAGNOSIS — J9621 Acute and chronic respiratory failure with hypoxia: Secondary | ICD-10-CM | POA: Diagnosis not present

## 2016-08-19 DIAGNOSIS — Z881 Allergy status to other antibiotic agents status: Secondary | ICD-10-CM

## 2016-08-19 DIAGNOSIS — I6529 Occlusion and stenosis of unspecified carotid artery: Secondary | ICD-10-CM

## 2016-08-19 DIAGNOSIS — F329 Major depressive disorder, single episode, unspecified: Secondary | ICD-10-CM | POA: Diagnosis present

## 2016-08-19 DIAGNOSIS — I6521 Occlusion and stenosis of right carotid artery: Secondary | ICD-10-CM | POA: Diagnosis not present

## 2016-08-19 DIAGNOSIS — Z79899 Other long term (current) drug therapy: Secondary | ICD-10-CM

## 2016-08-19 DIAGNOSIS — I110 Hypertensive heart disease with heart failure: Secondary | ICD-10-CM | POA: Diagnosis present

## 2016-08-19 DIAGNOSIS — I6523 Occlusion and stenosis of bilateral carotid arteries: Secondary | ICD-10-CM | POA: Diagnosis present

## 2016-08-19 DIAGNOSIS — J9602 Acute respiratory failure with hypercapnia: Secondary | ICD-10-CM

## 2016-08-19 DIAGNOSIS — Z888 Allergy status to other drugs, medicaments and biological substances status: Secondary | ICD-10-CM

## 2016-08-19 DIAGNOSIS — G8929 Other chronic pain: Secondary | ICD-10-CM | POA: Diagnosis present

## 2016-08-19 LAB — CBC WITH DIFFERENTIAL/PLATELET
Basophils Absolute: 0 10*3/uL (ref 0–0.1)
Basophils Relative: 0 %
EOS ABS: 0.3 10*3/uL (ref 0–0.7)
EOS PCT: 4 %
HCT: 29.8 % — ABNORMAL LOW (ref 40.0–52.0)
Hemoglobin: 9.8 g/dL — ABNORMAL LOW (ref 13.0–18.0)
LYMPHS ABS: 0.6 10*3/uL — AB (ref 1.0–3.6)
Lymphocytes Relative: 9 %
MCH: 29.8 pg (ref 26.0–34.0)
MCHC: 32.9 g/dL (ref 32.0–36.0)
MCV: 90.7 fL (ref 80.0–100.0)
Monocytes Absolute: 0.8 10*3/uL (ref 0.2–1.0)
Monocytes Relative: 11 %
Neutro Abs: 5.4 10*3/uL (ref 1.4–6.5)
Neutrophils Relative %: 76 %
Platelets: 142 10*3/uL — ABNORMAL LOW (ref 150–440)
RBC: 3.29 MIL/uL — AB (ref 4.40–5.90)
RDW: 16.9 % — ABNORMAL HIGH (ref 11.5–14.5)
WBC: 7.1 10*3/uL (ref 3.8–10.6)

## 2016-08-19 LAB — BLOOD GAS, VENOUS
ACID-BASE EXCESS: 15.6 mmol/L — AB (ref 0.0–2.0)
Acid-Base Excess: 14.6 mmol/L — ABNORMAL HIGH (ref 0.0–2.0)
BICARBONATE: 43.2 mmol/L — AB (ref 20.0–28.0)
Bicarbonate: 43.4 mmol/L — ABNORMAL HIGH (ref 20.0–28.0)
DELIVERY SYSTEMS: POSITIVE
FIO2: 0.28
Mechanical Rate: 10
O2 Saturation: 67.7 %
O2 Saturation: 81.2 %
PH VEN: 7.33 (ref 7.250–7.430)
PH VEN: 7.37 (ref 7.250–7.430)
Patient temperature: 37
Patient temperature: 37
pCO2, Ven: 82 mmHg (ref 44.0–60.0)
pCO2, Ven: 75 mmHg (ref 44.0–60.0)
pO2, Ven: 38 mmHg (ref 32.0–45.0)
pO2, Ven: 47 mmHg — ABNORMAL HIGH (ref 32.0–45.0)

## 2016-08-19 LAB — COMPREHENSIVE METABOLIC PANEL
ALBUMIN: 3.6 g/dL (ref 3.5–5.0)
ALT: 12 U/L — ABNORMAL LOW (ref 17–63)
ANION GAP: 9 (ref 5–15)
AST: 19 U/L (ref 15–41)
Alkaline Phosphatase: 46 U/L (ref 38–126)
BILIRUBIN TOTAL: 1.2 mg/dL (ref 0.3–1.2)
BUN: 39 mg/dL — ABNORMAL HIGH (ref 6–20)
CO2: 37 mmol/L — ABNORMAL HIGH (ref 22–32)
Calcium: 9 mg/dL (ref 8.9–10.3)
Chloride: 95 mmol/L — ABNORMAL LOW (ref 101–111)
Creatinine, Ser: 1.33 mg/dL — ABNORMAL HIGH (ref 0.61–1.24)
GFR calc Af Amer: >60 mL/min (ref >60)
GFR, EST NON AFRICAN AMERICAN: 53 mL/min — AB (ref >60)
Glucose, Bld: 99 mg/dL (ref 65–99)
POTASSIUM: 4.9 mmol/L (ref 3.5–5.1)
Sodium: 141 mmol/L (ref 135–145)
TOTAL PROTEIN: 6.8 g/dL (ref 6.5–8.1)

## 2016-08-19 LAB — BRAIN NATRIURETIC PEPTIDE: B NATRIURETIC PEPTIDE 5: 239 pg/mL — AB (ref 0.0–100.0)

## 2016-08-19 LAB — TROPONIN I: Troponin I: <0.03 ng/mL (ref <0.03)

## 2016-08-19 MED ORDER — LORAZEPAM 2 MG/ML IJ SOLN
0.5000 mg | Freq: Once | INTRAMUSCULAR | Status: AC
  Administered 2016-08-20: 0.5 mg via INTRAVENOUS
  Filled 2016-08-19: qty 1

## 2016-08-19 MED ORDER — IPRATROPIUM-ALBUTEROL 0.5-2.5 (3) MG/3ML IN SOLN
3.0000 mL | Freq: Once | RESPIRATORY_TRACT | Status: AC
  Administered 2016-08-19: 3 mL via RESPIRATORY_TRACT
  Filled 2016-08-19: qty 3

## 2016-08-19 NOTE — ED Notes (Signed)
Charge RN informed that this RN must go back to assigned area in ER and will not be within view of patient. Primary assigned RN still in with another patient and has not gotten report on this patient. Primary RN, Celine MansSonja to call this RN for report.

## 2016-08-19 NOTE — ED Notes (Signed)
Patient repositioned on stretcher. Patient is easily alert. Bi-pap in place.

## 2016-08-19 NOTE — H&P (Addendum)
History and Physical   [facility] - [place] [facility] Admission History and Physical [employer], D.O.    Patient Name: Chad Huffman MR#: [id number] Date of Birth: 10-Aug-1945 Date of Admission: 08/19/2016  Referring MD/NP/PA: Dr. Mayford Knife Primary Care Physician: Dorothey Baseman, MD Outpatient Specialists: Dr. Sung Amabile,  Dr. Russella Dar (GI), Dr. Montez Morita Bo Merino) Patient coming from: Home  Chief Complaint: SOB Please note the entire history is obtained from the patient's emergency department chart, emergency department provider. Patient's personal history is limited by altered mental status.  HPI: Chad Huffman is a 71 y.o. male with a known history of chronic respiratory failure secondary to COPD, hypertension, depression, peripheral neuropathy was in a usual state of health until the evening patient was found at home on his Haldol without any oxygen on. EMS found the patient to be hypoxic with an O2 sat of 60% on room air with altered mental status, drowsy, lethargic. He is reportedly lives at home alone and has a friend check in on him but does not have any home health aides or nursing.  Of note patient was admitted to this facility where he was intubated for the same He did see Dr. Sung Amabile last week for follow-up of that hospitalization   ED Course: Patient was placed on BiPAP, received DuoNeb's and Ativan.  Review of Systems:  Unable to obtain secondary to altered mental status   Past Medical History:  Diagnosis Date  . Antalgic gait   . Carotid artery occlusion   . Chronic neck and back pain   . Complication of anesthesia    pt has had cervical fusion-limited neck flextion  . COPD (chronic obstructive pulmonary disease) (HCC)   . Depression   . DJD (degenerative joint disease)    Right shoulder  . Hypertension   . Lumbago   . Peripheral neuropathy [facility])     Past Surgical History:  Procedure Laterality Date  . ABDOMINAL AORTIC ANEURYSM REPAIR  2009   Endovascular  AAA repair  . ABDOMINAL AORTIC ANEURYSM REPAIR    . CARPAL TUNNEL RELEASE     bilateral CTS  . HAMMER TOE SURGERY  12/10/2011   Procedure: HAMMER TOE CORRECTION;  Surgeon: Ernestene Kiel, DPM;  Location: [facility];  Service: Podiatry;  Laterality: Right;  Right hammertoe repair second right with 2.5x42 orthopro screw   . IR GENERIC HISTORICAL  04/05/2016   IR GASTROSTOMY TUBE MOD SED 04/05/2016 [facility]  . IR GENERIC HISTORICAL  05/12/2016   IR CM INJ ANY COLONIC TUBE W/FLUORO 05/12/2016 Malachy Moan, MD [facility]  . IR GENERIC HISTORICAL  05/26/2016   IR CM INJ ANY COLONIC TUBE W/FLUORO 05/26/2016 [facility]  . IR GENERIC HISTORICAL  05/28/2016   IR REPLC GASTRO/COLONIC TUBE PERCUT W/FLUORO 05/28/2016 Brayton El, PA-C [facility]INTERV [facility]  . IR GENERIC HISTORICAL  07/02/2016   IR GASTROSTOMY TUBE REMOVAL 07/02/2016 Brayton El, PA-C [facility]  . METATARSAL OSTEOTOMY  12/10/2011   Procedure: METATARSAL OSTEOTOMY;  Surgeon: Ernestene Kiel, DPM;  Location: [facility];  Service: Podiatry;  Laterality: Right;  Right Keller arthroplasty with size 5 silicone implant right great toe; second metatarsal osteotomy with 2.0x14 screw  . NECK SURGERY    . PERIPHERAL VASCULAR CATHETERIZATION N/A 02/11/2015   Procedure: Carotid Angiography;  Surgeon: Nada Libman, MD;  Location: [facility] INVASIVE CV LAB;  Service: Cardiovascular;  Laterality: N/A;  . PILONIDAL CYST / SINUS EXCISION    . Rotary cuff    . SPINE  SURGERY  09/15/11   Scar tissue removed- back  . SPINE SURGERY  09/15/11   Scar tissue removed- back  . TONSILLECTOMY    . TRACHEOSTOMY TUBE PLACEMENT N/A 03/03/2016   Procedure: TRACHEOSTOMY;  Surgeon: Geanie Logan, MD;  Location: [facility] ORS;  Service: ENT;  Laterality: N/A;     reports that he quit smoking about 9 years ago. He has never used smokeless tobacco. He reports that he drinks about 1.2 oz of alcohol per week . He reports that he does not use  drugs.  Allergies  Allergen Reactions  . Ampicillin Swelling and Other (See Comments)    Unable to obtain enough information to answer additional questions about this medication.  Has patient had a PCN reaction causing immediate rash, facial/tongue/throat swelling, SOB or lightheadedness with hypotension: Yes Has patient had a PCN reaction causing severe rash involving mucus membranes or skin necrosis: No Has patient had a PCN reaction that required hospitalization No Has patient had a PCN reaction occurring within the last 10 years: Yes If all of the above answers are "NO", then may pr  . Nortriptyline Other (See Comments)    Sleep walking  . Brimonidine Other (See Comments)    Unknown reaction per MAR   . Cyclobenzaprine Other (See Comments)    Hallucinations  . Tizanidine Other (See Comments)    Hallucinations  . Gabapentin Rash    Family History  Problem Relation Age of Onset  . Stomach cancer Mother   . Hypertension Brother   . Heart attack Father    Unable to confirm with patient secondary to altered mental status  Prior to Admission medications   Medication Sig Start Date End Date Taking? Authorizing Provider  albuterol (ACCUNEB) 1.25 MG/3ML nebulizer solution Take 1 ampule by nebulization every 4 (four) hours as needed for wheezing.   Yes Historical Provider, MD  budesonide-formoterol (SYMBICORT) 160-4.5 MCG/ACT inhaler Inhale 2 puffs into the lungs 2 (two) times daily.   Yes Historical Provider, MD  clonazePAM (KLONOPIN) 0.5 MG tablet Place 1 tablet (0.5 mg total) into feeding tube 2 (two) times daily. 07/27/16  Yes Katharina Caper, MD  docusate sodium (COLACE) 100 MG capsule Take 100 mg by mouth 2 (two) times daily.    Yes Historical Provider, MD  folic acid (FOLVITE) 1 MG tablet Place 1 mg into feeding tube daily.    Yes Historical Provider, MD  lisinopril (PRINIVIL,ZESTRIL) 20 MG tablet Take 1 tablet (20 mg total) by mouth 2 (two) times daily. 07/27/16  Yes Katharina Caper, MD   metoCLOPramide (REGLAN) 5 MG tablet Place 2.5 mg into feeding tube every 6 (six) hours.   Yes Historical Provider, MD  metoprolol tartrate (LOPRESSOR) 25 MG tablet Take 1 tablet (25 mg total) by mouth 2 (two) times daily. 07/27/16  Yes Katharina Caper, MD  oxyCODONE (OXY IR/ROXICODONE) 5 MG immediate release tablet Place 1 tablet (5 mg total) into feeding tube every 8 (eight) hours. 07/27/16  Yes Katharina Caper, MD  QUEtiapine (SEROQUEL) 50 MG tablet Take 50 mg by mouth at bedtime.   Yes Historical Provider, MD  sertraline (ZOLOFT) 50 MG tablet Place 75 mg into feeding tube daily.    Yes Historical Provider, MD  tiotropium (SPIRIVA) 18 MCG inhalation capsule Place 1 capsule (18 mcg total) into inhaler and inhale daily. 07/28/16  Yes Katharina Caper, MD  torsemide (DEMADEX) 20 MG tablet Place 20 mg into feeding tube daily.    Yes Historical Provider, MD  budesonide (PULMICORT) 0.5 MG/2ML nebulizer  solution Take 0.5 mg by nebulization 2 (two) times daily.    Historical Provider, MD  ipratropium-albuterol (DUONEB) 0.5-2.5 (3) MG/3ML SOLN Take 3 mLs by nebulization 3 (three) times daily.    Historical Provider, MD  Multiple Vitamin (MULTIVITAMIN WITH MINERALS) TABS tablet Take 1 tablet by mouth daily.    Historical Provider, MD  Nutritional Supplements (FEEDING SUPPLEMENT, GLUCERNA 1.5 CAL,) LIQD Place 60 mL/hr into feeding tube continuous.    Historical Provider, MD  OXYGEN 4 L/min by Intratracheal route as needed (for shortness of breath).     Historical Provider, MD    Physical Exam: Vitals:   08/19/16 1935 08/19/16 2000 08/19/16 2030  BP: (!) 146/79 126/62 (!) 148/65  Pulse: 76 67 78  Resp: 18 15 13   Temp: 98.3 F (36.8 C)    TempSrc: Oral    SpO2: 98% 97% 93%  Weight: 94.3 kg (208 lb)    Height: 5\' 8"  (1.727 m)      GENERAL: 71 y.o.-year-old Chronically ill appearing white male patient,  lying in the bed, BiPAP in place, in no acute distress.  Pleasantly confused HEENT: Head atraumatic,  normocephalic. Pupils equal, round, reactive to light and accommodation. No scleral icterus. Extraocular muscles intact. Nares are patent. Oropharynx is clear. Mucus membranes moist. NECK: Patient has a trach stoma  Supple, full range of motion. No JVD, no bruit heard. No thyroid enlargement, no tenderness, no cervical lymphadenopathy. CHEST: Decreased breath sounds throughout with prolonged expirations and expiratory wheezing. No use of accessory muscles of respiration.  No reproducible chest wall tenderness. O2 sat noted to decrease when patient attempted to converse. CARDIOVASCULAR: S1, S2 normal. No murmurs, rubs, or gallops. Cap refill <2 seconds. Pulses intact distally.  ABDOMEN: Soft, nondistended, nontender. No rebound, guarding, rigidity. Normoactive bowel sounds present in all four quadrants. No organomegaly or mass. Patient has a stoma at site of previous PEG. EXTREMITIES: No pedal edema, cyanosis, or clubbing. No calf tenderness or Homan's sign.  NEUROLOGIC: The patient is alert and oriented x 1. Follow some commands. Unable to comply with formal neurologic exam SKIN: Warm, dry, and intact without obvious rash, lesion, or ulcer.    Labs on Admission:  CBC:  Recent Labs Lab 08/19/16 1930  WBC 7.1  NEUTROABS 5.4  HGB 9.8*  HCT 29.8*  MCV 90.7  PLT 142*   Basic Metabolic Panel:  Recent Labs Lab 08/19/16 1930  NA 141  K 4.9  CL 95*  CO2 37*  GLUCOSE 99  BUN 39*  CREATININE 1.33*  CALCIUM 9.0   GFR: Estimated Creatinine Clearance: 59.2 mL/min (by C-G formula based on SCr of 1.33 mg/dL (H)). Liver Function Tests:  Recent Labs Lab 08/19/16 1930  AST 19  ALT 12*  ALKPHOS 46  BILITOT 1.2  PROT 6.8  ALBUMIN 3.6   No results for input(s): LIPASE, AMYLASE in the last 168 hours. No results for input(s): AMMONIA in the last 168 hours. Coagulation Profile: No results for input(s): INR, PROTIME in the last 168 hours. Cardiac Enzymes:  Recent Labs Lab  08/19/16 1930  TROPONINI <0.03   BNP (last 3 results) No results for input(s): PROBNP in the last 8760 hours. HbA1C: No results for input(s): HGBA1C in the last 72 hours. CBG: No results for input(s): GLUCAP in the last 168 hours. Lipid Profile: No results for input(s): CHOL, HDL, LDLCALC, TRIG, CHOLHDL, LDLDIRECT in the last 72 hours. Thyroid Function Tests: No results for input(s): TSH, T4TOTAL, FREET4, T3FREE, THYROIDAB in the last 72  hours. Anemia Panel: No results for input(s): VITAMINB12, FOLATE, FERRITIN, TIBC, IRON, RETICCTPCT in the last 72 hours. Urine analysis:    Component Value Date/Time   COLORURINE YELLOW 04/16/2016 0434   APPEARANCEUR CLEAR 04/16/2016 0434   APPEARANCEUR Clear 05/28/2014 1539   LABSPEC 1.017 04/16/2016 0434   LABSPEC 1.003 05/28/2014 1539   PHURINE 7.0 04/16/2016 0434   GLUCOSEU NEGATIVE 04/16/2016 0434   GLUCOSEU Negative 05/28/2014 1539   HGBUR NEGATIVE 04/16/2016 0434   BILIRUBINUR NEGATIVE 04/16/2016 0434   BILIRUBINUR Negative 05/28/2014 1539   KETONESUR NEGATIVE 04/16/2016 0434   PROTEINUR NEGATIVE 04/16/2016 0434   UROBILINOGEN 0.2 08/05/2014 0031   NITRITE NEGATIVE 04/16/2016 0434   LEUKOCYTESUR NEGATIVE 04/16/2016 0434   LEUKOCYTESUR 1+ 05/28/2014 1539   Sepsis Labs: @LABRCNTIP (procalcitonin:4,lacticidven:4) )No results found for this or any previous visit (from the past 240 hour(s)).   Radiological Exams on Admission: Dg Chest Port 1 View  Result Date: 08/19/2016 CLINICAL DATA:  Dyspnea and weakness EXAM: PORTABLE CHEST 1 VIEW COMPARISON:  08/16/2016 FINDINGS: Cardiac shadow is within normal limits. The lungs are well aerated bilaterally. Mild right basilar scarring is again seen. Elevation of the right hemidiaphragm is again noted. IMPRESSION: No change from prior exam.  No acute abnormality noted. Electronically Signed   By: Alcide Clever M.D.   On: 08/19/2016 20:14    EKG: Normal sinus rhythm at 60 bpm with normal axis,  prolonged PR and nonspecific ST-T wave changes.   Assessment/Plan  This is a 71 y.o. male with a history of chronic respiratory failure secondary to COPD, hypertension, depression, peripheral neuropathy now being admitted with: 1. Acute on chronic respiratory failure with hypoxia and hypercapniaWith altered mental status secondary to hypercapnia - IV steroids - O2 therapy with BiPAP for now. Wean as tolerated. Maintain O2 sats between 80-92% -Nebulizers and and expectorants as needed.  -Check flu swab -Continue Symbicort, Spiriva, Pulmicort - Continuous pulse oximetry -Pulmonary consultation has been requested.  2. Acute kidney injury  - Gentle IV fluids and repeat BMP in AM.  - Avoid nephrotoxic medications - hold lisinopril for now - Bladder scan and place foley catheter if evidence of urinary retention  3. Anemia, chronic, stable -Monitor CBC -Monitor for signs of bleeding   4. History of hypertension -Continue metoprolol  5. History of depression -Continue Seroquel, Zoloft, Klonopin  6. History of chronic diastolic congestive heart failure -Continue torsemide, metoprolol. Resume lisinopril when able   Admission status: Inpatient, stepdown IV Fluids: NS Diet/Nutrition: NPO Consults called: Pulm  DVT Px: Lovenox, SCDs and early ambulation. Code Status: Full Code  Disposition Plan: To home in 1-2 days   All the records are reviewed and case discussed with ED provider.   Shan Padgett D.O. on 08/19/2016 at 9:51 PM Between 7am to 6pm - Pager - [phone] After 6pm go to [email or web address] - Social research officer, government [facility]  Hospitalists Office [phone] CC: Primary care physician; Dorothey Baseman, MD   08/19/2016, 9:51 PM

## 2016-08-19 NOTE — ED Notes (Signed)
Pt tolerating BIPAP well at this time.

## 2016-08-19 NOTE — ED Provider Notes (Signed)
[facility] Emergency Department Provider Note        Time seen: ----------------------------------------- 7:32 PM on 08/19/2016 -----------------------------------------  L5 caveat: Review of systems and history is limited by altered mental status  I have reviewed the triage vital signs and the nursing notes.   HISTORY  Chief Complaint Altered Mental Status    HPI Chad Huffman is a 71 y.o. male who presents to ER for shortness of breath. Patient was found on his couch by EMS sleep without oxygen on. Initially room air saturations were 60%. He was placed on 4 L and O2 improved to 95%. He wears oxygen chronically. Patient does report history of chronic respiratory failure and was seen by pulmonology in the last week. He was also admitted to the hospital within the last month for acute respiratory failure.    Past Medical History:  Diagnosis Date  . Antalgic gait   . Carotid artery occlusion   . Chronic neck and back pain   . Complication of anesthesia    pt has had cervical fusion-limited neck flextion  . COPD (chronic obstructive pulmonary disease) (HCC)   . Depression   . DJD (degenerative joint disease)    Right shoulder  . Hypertension   . Lumbago   . Peripheral neuropathy [facility](HCC)     Patient Active Problem List   Diagnosis Date Noted  . Acute on chronic diastolic CHF (congestive heart failure) (HCC) 07/27/2016  . Leukocytosis 07/27/2016  . Thrombocytopenia (HCC) 07/27/2016  . Anemia 07/27/2016  . Acute on chronic respiratory failure with hypoxia and hypercapnia (HCC) 07/16/2016  . 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 Dysphagia, pharyngoesophageal phase 05/18/2016  . GERD without esophagitis 05/09/2016  . Bilateral lower extremity edema 05/09/2016  . Slow transit constipation 05/09/2016  . Depression with anxiety 05/09/2016  . Respiratory failure with hypoxia and hypercapnia (HCC)  07/22/2015  . Cervical vertebral fusion   . Depression   . Chronic neck and back pain   . Acute on chronic respiratory failure with hypercapnia (HCC)   . COPD exacerbation (HCC)   . HCAP (healthcare-associated pneumonia)   . Congestive dilated cardiomyopathy (HCC)   . Syncope and collapse   . Arthritis of right hip 08/04/2014  . Essential hypertension 08/04/2014  . Hypothyroidism 08/04/2014  . Syncope 08/04/2014  . Fall 08/04/2014    Past Surgical History:  Procedure Laterality Date  . ABDOMINAL AORTIC ANEURYSM REPAIR  2009   Endovascular AAA repair  . ABDOMINAL AORTIC ANEURYSM REPAIR    . CARPAL TUNNEL RELEASE     bilateral CTS  . HAMMER TOE SURGERY  12/10/2011   Procedure: HAMMER TOE CORRECTION;  Surgeon: Ernestene KielMax Hyatt, DPM;  Location: [facility];  Service: Podiatry;  Laterality: Right;  Right hammertoe repair second right with 2.5x42 orthopro screw   . IR GENERIC HISTORICAL  04/05/2016   IR GASTROSTOMY TUBE MOD SED 04/05/2016 [facility]  . IR GENERIC HISTORICAL  05/12/2016   IR CM INJ ANY COLONIC TUBE W/FLUORO 05/12/2016 Malachy MoanHeath McCullough, MD [facility]  . IR GENERIC HISTORICAL  05/26/2016   IR CM INJ ANY COLONIC TUBE W/FLUORO 05/26/2016 [facility]  . IR GENERIC HISTORICAL  05/28/2016   IR REPLC GASTRO/COLONIC TUBE PERCUT W/FLUORO 05/28/2016 Brayton ElKevin Bruning, PA-C [facility]  . IR GENERIC HISTORICAL  07/02/2016   IR GASTROSTOMY TUBE REMOVAL 07/02/2016 Brayton ElKevin Bruning, PA-C [facility]  . METATARSAL OSTEOTOMY  12/10/2011   Procedure: METATARSAL OSTEOTOMY;  Surgeon: Ernestene Kiel, DPM;  Location: [facility];  Service: Podiatry;  Laterality: Right;  Right Keller arthroplasty with size 5 silicone implant right great toe; second metatarsal osteotomy with 2.0x14 screw  . NECK SURGERY    . PERIPHERAL VASCULAR CATHETERIZATION N/A 02/11/2015   Procedure: Carotid Angiography;  Surgeon: Nada Libman, MD;  Location: [facility] INVASIVE CV LAB;  Service: Cardiovascular;   Laterality: N/A;  . PILONIDAL CYST / SINUS EXCISION    . Rotary cuff    . SPINE SURGERY  09/15/11   Scar tissue removed- back  . SPINE SURGERY  09/15/11   Scar tissue removed- back  . TONSILLECTOMY    . TRACHEOSTOMY TUBE PLACEMENT N/A 03/03/2016   Procedure: TRACHEOSTOMY;  Surgeon: Geanie Logan, MD;  Location: [facility];  Service: ENT;  Laterality: N/A;    Allergies Ampicillin; Nortriptyline; Brimonidine; Cyclobenzaprine; Tizanidine; and Gabapentin  Social History Social History  Substance Use Topics  . Smoking status: Former Smoker    Quit date: 08/30/2006  . Smokeless tobacco: Never Used  . Alcohol use 1.2 oz/week    2 Cans of beer per week     Comment: occasional use    Review of Systems Constitutional: Negative for fever. Cardiovascular: Negative for chest pain. Respiratory: Positive for shortness of breath Gastrointestinal: Negative for abdominal pain, vomiting and diarrhea. Genitourinary: Negative for dysuria. Musculoskeletal: Negative for back pain. Skin: Negative for rash. Neurological: Negative for headaches, focal weakness or numbness.  10-point ROS otherwise negative.  ____________________________________________   PHYSICAL EXAM:  VITAL SIGNS: ED Triage Vitals  Enc Vitals Group     BP      Pulse      Resp      Temp      Temp src      SpO2      Weight      Height      Head Circumference      Peak Flow      Pain Score      Pain Loc      Pain Edu?      Excl. in GC?     Constitutional: Alert and oriented. Drowsy, mild distress Eyes: Conjunctivae are normal. PERRL. Normal extraocular movements. ENT   Head: Normocephalic and atraumatic.   Nose: No congestion/rhinnorhea.   Mouth/Throat: Mucous membranes are moist.   Neck: No stridor. Cardiovascular: Normal rate, regular rhythm. No murmurs, rubs, or gallops. Respiratory: Diminished breath sounds with wheezing bilaterally. Prolonged expirations. Gastrointestinal: Soft and nontender.  Normal bowel sounds Musculoskeletal: Nontender with normal range of motion in all extremities. No lower extremity tenderness nor edema. Neurologic:  Normal speech and language. No gross focal neurologic deficits are appreciated.  Skin:  Skin is warm, dry and intact. No rash noted. Psychiatric: Mood and affect are normal.  ____________________________________________  EKG: Interpreted by me. Sinus rhythm with a rate of 60 bpm, prolonged PR interval, normal QT, normal QRS  ____________________________________________  ED COURSE:  Pertinent labs & imaging results that were available during my care of the patient were reviewed by me and considered in my medical decision making (see chart for details). Patient presents to ER with shortness of breath. We will assess with labs and imaging.   Procedures ____________________________________________   LABS (pertinent positives/negatives)  Labs Reviewed  CBC WITH DIFFERENTIAL/PLATELET - Abnormal; Notable for the following:       Result Value   RBC 3.29 (*)    Hemoglobin 9.8 (*)    HCT 29.8 (*)  RDW 16.9 (*)    Platelets 142 (*)    Lymphs Abs 0.6 (*)    All other components within normal limits  COMPREHENSIVE METABOLIC PANEL - Abnormal; Notable for the following:    Chloride 95 (*)    CO2 37 (*)    BUN 39 (*)    Creatinine, Ser 1.33 (*)    ALT 12 (*)    GFR calc non Af Amer 53 (*)    All other components within normal limits  BLOOD GAS, VENOUS - Abnormal; Notable for the following:    pCO2, Ven 82 (*)    Bicarbonate 43.2 (*)    Acid-Base Excess 14.6 (*)    All other components within normal limits  TROPONIN I  BRAIN NATRIURETIC PEPTIDE   CRITICAL CARE Performed by: Emily Filbert   Total critical care time: 30 minutes  Critical care time was exclusive of separately billable procedures and treating other patients.  Critical care was necessary to treat or prevent imminent or life-threatening deterioration.  Critical  care was time spent personally by me on the following activities: development of treatment plan with patient and/or surrogate as well as nursing, discussions with consultants, evaluation of patient's response to treatment, examination of patient, obtaining history from patient or surrogate, ordering and performing treatments and interventions, ordering and review of laboratory studies, ordering and review of radiographic studies, pulse oximetry and re-evaluation of patient's condition.  RADIOLOGY  Chest x-ray IMPRESSION: No change from prior exam. No acute abnormality noted. ____________________________________________  FINAL ASSESSMENT AND PLAN  Dyspnea, Acute hypercarbic respiratory failure  Plan: Patient with labs and imaging as dictated above. Patient presented to the ER for dyspnea, found to have acute hypercarbic respiratory failure of which she has a history. He's been placed on BiPAP and has shown improvement and last 45 minutes. I will discuss with the hospitalist for admission.   Emily Filbert, MD   Note: This note was generated in part or whole with voice recognition software. Voice recognition is usually quite accurate but there are transcription errors that can and very often do occur. I apologize for any typographical errors that were not detected and corrected.     Emily Filbert, MD 08/19/16 2112

## 2016-08-19 NOTE — ED Notes (Addendum)
Pt has very poor concentration, trouble following commands. Weakness to bilateral hands which he states is from carpal tunnel. Pt unable to hold his nebulizer up for breathing treatment without it falling multiple times due to bilateral hand weakness. When inquiring how patient lives at home alone, he states that he uses a walker, has home health nurse and meals delivered to him. Pt answers orientation questions X 4 appropriately.

## 2016-08-19 NOTE — ED Notes (Signed)
Pt arousable to physical stimuli, awakens eyes and will answer questions appropriately. When not being interacted with, pt shuts eyes.

## 2016-08-19 NOTE — ED Notes (Signed)
RT called to place patient on BIPAP

## 2016-08-19 NOTE — ED Triage Notes (Addendum)
Pt arrives to ER via [facility] from home. Pt found on couch, asleep without oxygen on. Pt initially 60% on RA. Pt placed on 4L [place] and oxygen saturation up to 95%. Pt wears oxygen chronically. PT appears drowsy. Pt alert and oriented X 4. Pt lives alone. CBG 116 with [facility].

## 2016-08-20 DIAGNOSIS — J9622 Acute and chronic respiratory failure with hypercapnia: Secondary | ICD-10-CM

## 2016-08-20 DIAGNOSIS — J9621 Acute and chronic respiratory failure with hypoxia: Secondary | ICD-10-CM

## 2016-08-20 LAB — CBC
HCT: 30.6 % — ABNORMAL LOW (ref 40.0–52.0)
Hemoglobin: 10.2 g/dL — ABNORMAL LOW (ref 13.0–18.0)
MCH: 30.3 pg (ref 26.0–34.0)
MCHC: 33.5 g/dL (ref 32.0–36.0)
MCV: 90.6 fL (ref 80.0–100.0)
PLATELETS: 141 10*3/uL — AB (ref 150–440)
RBC: 3.38 MIL/uL — AB (ref 4.40–5.90)
RDW: 17.3 % — ABNORMAL HIGH (ref 11.5–14.5)
WBC: 5.8 10*3/uL (ref 3.8–10.6)

## 2016-08-20 LAB — BASIC METABOLIC PANEL
ANION GAP: 8 (ref 5–15)
BUN: 34 mg/dL — ABNORMAL HIGH (ref 6–20)
CO2: 35 mmol/L — ABNORMAL HIGH (ref 22–32)
Calcium: 9 mg/dL (ref 8.9–10.3)
Chloride: 96 mmol/L — ABNORMAL LOW (ref 101–111)
Creatinine, Ser: 1.03 mg/dL (ref 0.61–1.24)
GFR calc Af Amer: >60 mL/min (ref >60)
GLUCOSE: 103 mg/dL — AB (ref 65–99)
POTASSIUM: 5.6 mmol/L — AB (ref 3.5–5.1)
SODIUM: 139 mmol/L (ref 135–145)

## 2016-08-20 LAB — INFLUENZA PANEL BY PCR (TYPE A & B)
INFLAPCR: NEGATIVE
INFLBPCR: NEGATIVE

## 2016-08-20 LAB — GLUCOSE, CAPILLARY: Glucose-Capillary: 88 mg/dL (ref 65–99)

## 2016-08-20 LAB — MRSA PCR SCREENING: MRSA BY PCR: NEGATIVE

## 2016-08-20 LAB — MAGNESIUM: MAGNESIUM: 2.1 mg/dL (ref 1.7–2.4)

## 2016-08-20 LAB — PHOSPHORUS: PHOSPHORUS: 4.1 mg/dL (ref 2.5–4.6)

## 2016-08-20 MED ORDER — CLONAZEPAM 0.5 MG PO TABS: 0.5000 mg | ORAL_TABLET | Freq: Two times a day (BID) | ORAL | Status: DC

## 2016-08-20 MED ORDER — MOMETASONE FURO-FORMOTEROL FUM 200-5 MCG/ACT IN AERO
2.0000 | INHALATION_SPRAY | Freq: Two times a day (BID) | RESPIRATORY_TRACT | Status: DC
  Administered 2016-08-20 – 2016-08-23 (×7): 2 via RESPIRATORY_TRACT
  Filled 2016-08-20: qty 8.8

## 2016-08-20 MED ORDER — ONDANSETRON HCL 4 MG PO TABS: 4.0000 mg | ORAL_TABLET | Freq: Four times a day (QID) | ORAL | Status: DC | PRN

## 2016-08-20 MED ORDER — SENNOSIDES-DOCUSATE SODIUM 8.6-50 MG PO TABS: 1.0000 | ORAL_TABLET | Freq: Every evening | ORAL | Status: DC | PRN

## 2016-08-20 MED ORDER — IPRATROPIUM-ALBUTEROL 0.5-2.5 (3) MG/3ML IN SOLN
3.0000 mL | Freq: Three times a day (TID) | RESPIRATORY_TRACT | Status: DC | PRN
  Administered 2016-08-22: 3 mL via RESPIRATORY_TRACT
  Filled 2016-08-20: qty 3

## 2016-08-20 MED ORDER — BUDESONIDE 0.5 MG/2ML IN SUSP
0.5000 mg | Freq: Two times a day (BID) | RESPIRATORY_TRACT | Status: DC
  Administered 2016-08-20 – 2016-08-23 (×7): 0.5 mg via RESPIRATORY_TRACT
  Filled 2016-08-20 (×7): qty 2

## 2016-08-20 MED ORDER — GUAIFENESIN ER 600 MG PO TB12
600.0000 mg | ORAL_TABLET | Freq: Two times a day (BID) | ORAL | Status: DC
  Administered 2016-08-20 – 2016-08-23 (×7): 600 mg via ORAL
  Filled 2016-08-20 (×7): qty 1

## 2016-08-20 MED ORDER — QUETIAPINE FUMARATE 25 MG PO TABS
50.0000 mg | ORAL_TABLET | Freq: Every day | ORAL | Status: DC
  Administered 2016-08-20 – 2016-08-22 (×3): 50 mg via ORAL
  Filled 2016-08-20 (×3): qty 2

## 2016-08-20 MED ORDER — DM-GUAIFENESIN ER 30-600 MG PO TB12: 1.0000 | ORAL_TABLET | Freq: Two times a day (BID) | ORAL | Status: DC

## 2016-08-20 MED ORDER — DOCUSATE SODIUM 100 MG PO CAPS
100.0000 mg | ORAL_CAPSULE | Freq: Two times a day (BID) | ORAL | Status: DC
  Administered 2016-08-20 – 2016-08-22 (×6): 100 mg via ORAL
  Filled 2016-08-20 (×7): qty 1

## 2016-08-20 MED ORDER — OXYCODONE HCL 5 MG PO TABS
5.0000 mg | ORAL_TABLET | Freq: Three times a day (TID) | ORAL | Status: DC | PRN
  Administered 2016-08-21: 5 mg
  Filled 2016-08-20: qty 1

## 2016-08-20 MED ORDER — GLUCERNA 1.5 CAL PO LIQD: 60.0000 mL/h | ORAL | Status: DC

## 2016-08-20 MED ORDER — TIOTROPIUM BROMIDE MONOHYDRATE 18 MCG IN CAPS
18.0000 ug | ORAL_CAPSULE | Freq: Every day | RESPIRATORY_TRACT | Status: DC
  Administered 2016-08-20 – 2016-08-23 (×4): 18 ug via RESPIRATORY_TRACT
  Filled 2016-08-20 (×2): qty 5

## 2016-08-20 MED ORDER — BACLOFEN 10 MG PO TABS
20.0000 mg | ORAL_TABLET | Freq: Three times a day (TID) | ORAL | Status: DC
  Administered 2016-08-20 – 2016-08-23 (×9): 20 mg via ORAL
  Filled 2016-08-20 (×9): qty 2

## 2016-08-20 MED ORDER — ENOXAPARIN SODIUM 40 MG/0.4ML ~~LOC~~ SOLN
40.0000 mg | Freq: Every day | SUBCUTANEOUS | Status: DC
Start: 2016-08-20 — End: 2016-08-23
  Administered 2016-08-20 – 2016-08-22 (×4): 40 mg via SUBCUTANEOUS
  Filled 2016-08-20 (×4): qty 0.4

## 2016-08-20 MED ORDER — CHLORHEXIDINE GLUCONATE 0.12 % MT SOLN
15.0000 mL | Freq: Two times a day (BID) | OROMUCOSAL | Status: DC
  Administered 2016-08-20 – 2016-08-23 (×7): 15 mL via OROMUCOSAL
  Filled 2016-08-20 (×7): qty 15

## 2016-08-20 MED ORDER — ORAL CARE MOUTH RINSE
15.0000 mL | Freq: Two times a day (BID) | OROMUCOSAL | Status: DC
  Administered 2016-08-20 – 2016-08-23 (×6): 15 mL via OROMUCOSAL

## 2016-08-20 MED ORDER — SODIUM CHLORIDE 0.9 % IV SOLN
INTRAVENOUS | Status: DC
  Administered 2016-08-20: 03:00:00 via INTRAVENOUS

## 2016-08-20 MED ORDER — ACETAMINOPHEN 650 MG RE SUPP: 650.0000 mg | Freq: Four times a day (QID) | RECTAL | Status: DC | PRN

## 2016-08-20 MED ORDER — ONDANSETRON HCL 4 MG/2ML IJ SOLN: 4.0000 mg | Freq: Four times a day (QID) | INTRAMUSCULAR | Status: DC | PRN

## 2016-08-20 MED ORDER — TORSEMIDE 20 MG PO TABS
20.0000 mg | ORAL_TABLET | Freq: Every day | ORAL | Status: DC
  Administered 2016-08-21 – 2016-08-23 (×3): 20 mg via ORAL
  Filled 2016-08-20 (×3): qty 1

## 2016-08-20 MED ORDER — METOPROLOL TARTRATE 25 MG PO TABS
25.0000 mg | ORAL_TABLET | Freq: Two times a day (BID) | ORAL | Status: DC
  Administered 2016-08-20 – 2016-08-23 (×7): 25 mg via ORAL
  Filled 2016-08-20 (×7): qty 1

## 2016-08-20 MED ORDER — DEXTROMETHORPHAN POLISTIREX ER 30 MG/5ML PO SUER
30.0000 mg | Freq: Two times a day (BID) | ORAL | Status: DC
  Administered 2016-08-20 – 2016-08-23 (×7): 30 mg via ORAL
  Filled 2016-08-20 (×9): qty 5

## 2016-08-20 MED ORDER — METHYLPREDNISOLONE SODIUM SUCC 125 MG IJ SOLR
60.0000 mg | Freq: Three times a day (TID) | INTRAMUSCULAR | Status: DC
  Administered 2016-08-20 – 2016-08-23 (×10): 60 mg via INTRAVENOUS
  Filled 2016-08-20 (×10): qty 2

## 2016-08-20 MED ORDER — BISACODYL 5 MG PO TBEC
5.0000 mg | DELAYED_RELEASE_TABLET | Freq: Every day | ORAL | Status: DC | PRN
  Administered 2016-08-23: 5 mg via ORAL
  Filled 2016-08-20: qty 1

## 2016-08-20 MED ORDER — ACETAMINOPHEN 325 MG PO TABS: 650.0000 mg | ORAL_TABLET | Freq: Four times a day (QID) | ORAL | Status: DC | PRN

## 2016-08-20 MED ORDER — SODIUM CHLORIDE 0.9% FLUSH
3.0000 mL | Freq: Two times a day (BID) | INTRAVENOUS | Status: DC
  Administered 2016-08-20 – 2016-08-23 (×8): 3 mL via INTRAVENOUS

## 2016-08-20 MED ORDER — TORSEMIDE 20 MG PO TABS
20.0000 mg | ORAL_TABLET | Freq: Every day | ORAL | Status: DC
  Administered 2016-08-20: 20 mg
  Filled 2016-08-20: qty 1

## 2016-08-20 MED ORDER — METOCLOPRAMIDE HCL 5 MG PO TABS
2.5000 mg | ORAL_TABLET | Freq: Four times a day (QID) | ORAL | Status: DC | PRN
  Filled 2016-08-20: qty 0.5

## 2016-08-20 MED ORDER — MAGNESIUM CITRATE PO SOLN: 1.0000 | Freq: Once | ORAL | Status: DC | PRN

## 2016-08-20 MED ORDER — METHYLPREDNISOLONE SODIUM SUCC 125 MG IJ SOLR
60.0000 mg | Freq: Four times a day (QID) | INTRAMUSCULAR | Status: DC
  Administered 2016-08-20 (×2): 60 mg via INTRAVENOUS
  Filled 2016-08-20 (×2): qty 2

## 2016-08-20 MED ORDER — ALBUTEROL SULFATE (2.5 MG/3ML) 0.083% IN NEBU: 1.5000 mL | INHALATION_SOLUTION | RESPIRATORY_TRACT | Status: DC | PRN

## 2016-08-20 MED ORDER — CLONAZEPAM 0.5 MG PO TABS: 0.5000 mg | ORAL_TABLET | Freq: Two times a day (BID) | ORAL | Status: DC | PRN

## 2016-08-20 MED ORDER — ADULT MULTIVITAMIN W/MINERALS CH
1.0000 | ORAL_TABLET | Freq: Every day | ORAL | Status: DC
  Administered 2016-08-20 – 2016-08-23 (×4): 1 via ORAL
  Filled 2016-08-20 (×4): qty 1

## 2016-08-20 MED ORDER — SERTRALINE HCL 50 MG PO TABS
75.0000 mg | ORAL_TABLET | Freq: Every day | ORAL | Status: DC
  Administered 2016-08-20 – 2016-08-23 (×4): 75 mg
  Filled 2016-08-20: qty 2
  Filled 2016-08-20 (×2): qty 1
  Filled 2016-08-20 (×2): qty 2

## 2016-08-20 MED ORDER — FUROSEMIDE 10 MG/ML IJ SOLN
20.0000 mg | Freq: Three times a day (TID) | INTRAMUSCULAR | Status: AC
  Administered 2016-08-20 – 2016-08-21 (×3): 20 mg via INTRAVENOUS
  Filled 2016-08-20 (×3): qty 2

## 2016-08-20 MED ORDER — FOLIC ACID 1 MG PO TABS
1.0000 mg | ORAL_TABLET | Freq: Every day | ORAL | Status: DC
  Administered 2016-08-20 – 2016-08-23 (×4): 1 mg
  Filled 2016-08-20 (×4): qty 1

## 2016-08-20 NOTE — Consult Note (Addendum)
PULMONARY / CRITICAL CARE MEDICINE   Name: Chad LabradorDanny L Huffman MRN: [id number] DOB: [birth year]    ADMISSION DATE:  08/19/2016 CONSULTATION DATE:  08/20/16  REFERRING MD: Dr. Claudette StaplerHuglemyer  CHIEF COMPLAINT:  Acute Respiratory failure  HISTORY OF PRESENT ILLNESS:   Chad PaddockDanny Huffman is 71 year old male with PMH significant for  COPD-uses 3L of oxygen at home, depression, DJD, Hypertension and Peripheral neuropathy.  Patient was in his usual state of health.  Aparrently he took haldol and did not use his oxygen.  He was found on the couch by EMS with O2 sats down 60%, lethargic and altered mental status. Patient [facility] and was placed on Bipap.  Pccm team was consulted for further management.  Per outpatient note:  Prolonged hospitalization September 2017 to January 2018             Underwent emergency tracheostomy tube placement 03/03/2016             [facility] 04/2016             G tube removed 07/02/16                      Decannulated in early Jan 2018             Discharged to home 07/05/16 Re-admitted 07/16/16 with acute respiratory failure due to COPD with hypercarbic resp failure             Extubated 07/21/16  PAST MEDICAL HISTORY :  He  has a past medical history of Antalgic gait; Carotid artery occlusion; Chronic neck and back pain; Complication of anesthesia; COPD (chronic obstructive pulmonary disease) (HCC); Depression; DJD (degenerative joint disease); Hypertension; Lumbago; and Peripheral neuropathy (HCC).  PAST SURGICAL HISTORY: He  has a past surgical history that includes Tonsillectomy; Carpal tunnel release; Abdominal aortic aneurysm repair (2009); Pilonidal cyst / sinus excision; Spine surgery (09/15/11); Spine surgery (09/15/11); Neck surgery; Rotary cuff; Hammer toe surgery (12/10/2011); Metatarsal osteotomy (12/10/2011); Abdominal aortic aneurysm repair; Cardiac catheterization (N/A, 02/11/2015); Tracheostomy tube placement  (N/A, 03/03/2016); ir generic historical (04/05/2016); ir generic historical (05/12/2016); ir generic historical (05/26/2016); ir generic historical (05/28/2016); and ir generic historical (07/02/2016).  Allergies  Allergen Reactions  . Ampicillin Swelling and Other (See Comments)    Unable to obtain enough information to answer additional questions about this medication.  Has patient had a PCN reaction causing immediate rash, facial/tongue/throat swelling, SOB or lightheadedness with hypotension: Yes Has patient had a PCN reaction causing severe rash involving mucus membranes or skin necrosis: No Has patient had a PCN reaction that required hospitalization No Has patient had a PCN reaction occurring within the last 10 years: Yes If all of the above answers are "NO", then may pr  . Nortriptyline Other (See Comments)    Sleep walking  . Brimonidine Other (See Comments)    Unknown reaction per MAR   . Cyclobenzaprine Other (See Comments)    Hallucinations  . Tizanidine Other (See Comments)    Hallucinations  . Gabapentin Rash    No current facility-administered medications on file prior to encounter.    Current Outpatient Prescriptions on File Prior to Encounter  Medication Sig  . budesonide-formoterol (SYMBICORT) 160-4.5 MCG/ACT inhaler Inhale 2 puffs into the lungs 2 (two) times daily.  . clonazePAM (KLONOPIN) 0.5 MG tablet Place 1 tablet (0.5 mg total) into feeding tube 2 (two) times daily.  Marland Kitchen. docusate sodium (COLACE) 100  MG capsule Take 100 mg by mouth 2 (two) times daily.   . folic acid (FOLVITE) 1 MG tablet Place 1 mg into feeding tube daily.   Marland Kitchen lisinopril (PRINIVIL,ZESTRIL) 20 MG tablet Take 1 tablet (20 mg total) by mouth 2 (two) times daily.  . metoCLOPramide (REGLAN) 5 MG tablet Place 2.5 mg into feeding tube every 6 (six) hours.  . metoprolol tartrate (LOPRESSOR) 25 MG tablet Take 1 tablet (25 mg total) by mouth 2 (two) times daily.  Marland Kitchen oxyCODONE (OXY IR/ROXICODONE) 5 MG  immediate release tablet Place 1 tablet (5 mg total) into feeding tube every 8 (eight) hours.  . sertraline (ZOLOFT) 50 MG tablet Place 75 mg into feeding tube daily.   Marland Kitchen tiotropium (SPIRIVA) 18 MCG inhalation capsule Place 1 capsule (18 mcg total) into inhaler and inhale daily.  Marland Kitchen torsemide (DEMADEX) 20 MG tablet Place 20 mg into feeding tube daily.   . budesonide (PULMICORT) 0.5 MG/2ML nebulizer solution Take 0.5 mg by nebulization 2 (two) times daily.  Marland Kitchen ipratropium-albuterol (DUONEB) 0.5-2.5 (3) MG/3ML SOLN Take 3 mLs by nebulization 3 (three) times daily.  . Multiple Vitamin (MULTIVITAMIN WITH MINERALS) TABS tablet Take 1 tablet by mouth daily.  . Nutritional Supplements (FEEDING SUPPLEMENT, GLUCERNA 1.5 CAL,) LIQD Place 60 mL/hr into feeding tube continuous.  . OXYGEN 4 L/min by Intratracheal route as needed (for shortness of breath).     FAMILY HISTORY:  His indicated that his mother is deceased. He indicated that his father is deceased. He indicated that his brother is alive.    SOCIAL HISTORY: He  reports that he quit smoking about 9 years ago. He has never used smokeless tobacco. He reports that he drinks about 1.2 oz of alcohol per week . He reports that he does not use drugs.  REVIEW OF SYSTEMS:   Unable to obtain as the patient is on Bipap and in some distress.  SUBJECTIVE:  Unable to Obtain as the patient is on BiPAP and some distress  VITAL SIGNS: BP 126/64   Pulse (!) 56   Temp 98.6 F (37 C) (Oral)   Resp 16   Ht 5\' 8"  (1.727 m)   Wt 94.1 kg (207 lb 7.3 oz)   SpO2 98%   BMI 31.54 kg/m   HEMODYNAMICS:    VENTILATOR SETTINGS: FiO2 (%):  [28 %] 28 %  INTAKE / OUTPUT: No intake/output data recorded.  PHYSICAL EXAMINATION: General: ill appearing, Caucasian male, found on Bipap Neuro: Confused   HEENT: AT,[place],PERRLA, no JVD Cardiovascular:  S1S2, Regular, no m/r/g Lungs: Diminished throughout Abdomen: soft, nontender, +BS Musculoskeletal:  Active ROM, no  inflammation/ Deformity noted  Skin:  Warm,dry and intact  LABS:  BMET  Recent Labs Lab 08/19/16 1930  NA 141  K 4.9  CL 95*  CO2 37*  BUN 39*  CREATININE 1.33*  GLUCOSE 99    Electrolytes  Recent Labs Lab 08/19/16 1930  CALCIUM 9.0    CBC  Recent Labs Lab 08/19/16 1930  WBC 7.1  HGB 9.8*  HCT 29.8*  PLT 142*    Coag's No results for input(s): APTT, INR in the last 168 hours.  Sepsis Markers No results for input(s): LATICACIDVEN, PROCALCITON, O2SATVEN in the last 168 hours.  ABG No results for input(s): PHART, PCO2ART, PO2ART in the last 168 hours.  Liver Enzymes  Recent Labs Lab 08/19/16 1930  AST 19  ALT 12*  ALKPHOS 46  BILITOT 1.2  ALBUMIN 3.6    Cardiac Enzymes  Recent  Labs Lab 08/19/16 1930  TROPONINI <0.03    Glucose  Recent Labs Lab 08/20/16 0139  GLUCAP 88    Imaging Dg Chest Port 1 View  Result Date: 08/19/2016 CLINICAL DATA:  Dyspnea and weakness EXAM: PORTABLE CHEST 1 VIEW COMPARISON:  08/16/2016 FINDINGS: Cardiac shadow is within normal limits. The lungs are well aerated bilaterally. Mild right basilar scarring is again seen. Elevation of the right hemidiaphragm is again noted. IMPRESSION: No change from prior exam.  No acute abnormality noted. Electronically Signed   By: Alcide Clever M.D.   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 Re-admitted 07/16/16 with acute respiratory failure due to COPD with hypercarbic resp failure; Extubated  07/21/16 Mild pulm edema seen on CXR, will give IV lasix, then transition to home torsemide dose.  P:   -Continue Bipap, wean as tolerated to home O2 -Continue steroids -  Agree with Bronchodilator therapy -Continue guaifenesin DM - f/U Influenza swab  CARDIOVASCULAR A:  Hx of hypertension Chronic Diastolic Congestive Heart failure P:  Continuous telemetry Continue Metoprolol Continue Torsemide Rest per primary RENAL A:   Acute Kidney Injury P:   Gentle hydration  Follow BMP Replace electrolytes per usual guidelines Avoid Nephrotoxic drugs  GASTROINTESTINAL A:   No active issues P:   Npo for now  HEMATOLOGIC A:   Anemia of Chronic disease P:  H/H stable, transfuse per usual guidelines Enoxaparin for DVT prophylaxis  INFECTIOUS A:   No active issues P:   Monitor fever, CBC  ENDOCRINE A:   No active issues P:   Monitor blood glucose wiith BMP intermittenly  NEUROLOGIC A:   Chronic Neck and back pain Peripheral neuropathy Depression Insomnia P:   Continue seroquel  Continue Zoloft Continue Clonazepam   FAMILY  - Updates: No family present at the bedside    Bincy Varughese,AG-ACNP Pulmonary and Critical Care Medicine [facility]   08/20/2016, 3:38 AM  Patient seen and examined with NP, above note as amended reflects my findings, assessment, plan. Scattered crackles in the lungs bilaterally, the patient is currently on BiPAP. Will wean off as tolerated. Chest x-ray suggestive of mild pulmonary edema, we'll continue IV Lasix for today, transition to by mouth Lasix tomorrow if tolerated.  Wells Guiles M.D. 08/20/2016  Critical Care Attestation.  I have personally obtained a history, examined the patient, evaluated laboratory and imaging results, formulated the assessment and plan and placed orders. The Patient requires high complexity decision making for assessment and support, frequent evaluation and titration of therapies,  application of advanced monitoring technologies and extensive interpretation of multiple databases. The patient has critical illness that could lead imminently to failure of 1 or more organ systems and requires the highest level of physician preparedness to intervene.  Critical Care Time devoted to patient care services described in this note is 45 minutes and is exclusive of time spent in procedures.

## 2016-08-20 NOTE — Evaluation (Signed)
Physical Therapy Evaluation Patient Details Name: Chad Huffman MRN: [id number] DOB: 09-17-1945 Today's Date: 08/20/2016   History of Present Illness  presented to ER secondary to AMS, lethargy and hypoxia; admitted with acute/chronic hypoxic, hypercapnic respiratory failure.  Requiring BiPAP upon admission, now transitioned to 4L via [place].  Of note, patient with multiple recent hospitalizations with transition to STR at discharge (with subsequent return to hospital after 1-2 weeks home)  Clinical Impression  Upon evaluation, patient alert and oriented to basic information; eager for OOB activities.  Patient generally weak and deconditioned, requiring physical assist for all functional activities. Very anxious and fearful of falling given recent history.  Requires min/mod assist for bed mobility; min/mod assist +2 for sit/stand, bed/chair and short-distance gait (5') with RW.  Poor standing balance with increased sway in A/P plane, poor balance reactions and reaction time.  High fall risk.  Do recommend use of RW and +1 assist for all mobility at this time; unsafe to return home alone. Would benefit from skilled PT to address above deficits and promote optimal return to PLOF; recommend transition to STR upon discharge from acute hospitalization.  May consider transition to ALF upon discharge from STR for long-term support in home environment.     Follow Up Recommendations SNF    Equipment Recommendations       Recommendations for Other Services       Precautions / Restrictions Precautions Precautions: Fall Restrictions Weight Bearing Restrictions: No      Mobility  Bed Mobility Overal bed mobility: Needs Assistance Bed Mobility: Supine to Sit     Supine to sit: Min assist;Mod assist        Transfers Overall transfer level: Needs assistance Equipment used: Rolling walker (2 wheeled) Transfers: Sit to/from Stand Sit to Stand: Min assist;Mod assist         General transfer  comment: cuing for hand placement; assist for lift off and stabilization  Ambulation/Gait Ambulation/Gait assistance: Min assist;Mod assist Ambulation Distance (Feet): 5 Feet Assistive device: Rolling walker (2 wheeled)       General Gait Details: broad BOS, decreased step height/length; poor standing balance with increased postural sway in A/P plane  Stairs            Wheelchair Mobility    Modified Rankin (Stroke Patients Only)       Balance Overall balance assessment: Needs assistance Sitting-balance support: No upper extremity supported;Feet supported Sitting balance-Leahy Scale: Good     Standing balance support: Bilateral upper extremity supported Standing balance-Leahy Scale: Poor                               Pertinent Vitals/Pain Pain Assessment: Faces Faces Pain Scale: Hurts a little bit Pain Location: back Pain Descriptors / Indicators: Sore Pain Intervention(s): Monitored during session;Repositioned;Limited activity within patient's tolerance    Home Living Family/patient expects to be discharged to:: Private residence Living Arrangements: Alone Available Help at Discharge: Friend(s) (patient reports friends intermittently check in) Type of Home: House Home Access: Stairs to enter Entrance Stairs-Rails: Right Entrance Stairs-Number of Steps: 4 Home Layout: One level Home Equipment: Walker - 2 wheels;Cane - single point;Bedside commode;Shower seat      Prior Function Level of Independence: Independent with assistive device(s)         Comments: Since recent hospitalizations, rehab stints, has used RW for mobility; does endorse recurrent falls in home environment.  No driving.  Home O2 (2L) at  night only.     Hand Dominance   Dominant Hand: Right    Extremity/Trunk Assessment   Upper Extremity Assessment Upper Extremity Assessment:  (significant limitations in R shoulder elevation (all planes) due to chronic RTC injury; L LE  grossly WFL)    Lower Extremity Assessment Lower Extremity Assessment:  (grossly at least 4-/5 throughout bilat LEs)       Communication   Communication: No difficulties  Cognition Arousal/Alertness: Awake/alert Behavior During Therapy: WFL for tasks assessed/performed;Anxious Overall Cognitive Status: Within Functional Limits for tasks assessed                      General Comments      Exercises     Assessment/Plan    PT Assessment Patient needs continued PT services  PT Problem List Decreased strength;Decreased range of motion;Decreased activity tolerance;Decreased balance;Decreased mobility;Decreased coordination;Decreased cognition;Decreased knowledge of use of DME;Decreased safety awareness;Decreased knowledge of precautions;Cardiopulmonary status limiting activity       PT Treatment Interventions DME instruction;Gait training;Stair training;Functional mobility training;Therapeutic activities;Therapeutic exercise;Balance training;Patient/family education    PT Goals (Current goals can be found in the Care Plan section)  Acute Rehab PT Goals Patient Stated Goal: to get stronger PT Goal Formulation: With patient Time For Goal Achievement: 09/03/16 Potential to Achieve Goals: Good    Frequency Min 2X/week   Barriers to discharge Decreased caregiver support      Co-evaluation               End of Session Equipment Utilized During Treatment: Gait belt;Oxygen Activity Tolerance: Patient tolerated treatment well Patient left: in chair;with call bell/phone within reach;with family/visitor present (alarm not available; RN informed/aware of patient position and level of assist recommended) Nurse Communication: Mobility status PT Visit Diagnosis: Muscle weakness (generalized) (M62.81);Unsteadiness on feet (R26.81);Repeated falls (R29.6)         Time: 1100-1119 PT Time Calculation (min) (ACUTE ONLY): 19 min   Charges:   PT Evaluation $PT Eval  Moderate Complexity: 1 Procedure     PT G Codes:         Akyra Bouchie H. Manson Passey, PT, DPT, NCS 08/20/16, 12:20 PM [phone]

## 2016-08-20 NOTE — Progress Notes (Signed)
Pt. Potassium 5.6. MD notified. No new orders given,.

## 2016-08-20 NOTE — Progress Notes (Signed)
Initial Nutrition Assessment  DOCUMENTATION CODES:   Obesity unspecified  INTERVENTION:  -No interventions needed at this time  NUTRITION DIAGNOSIS:   Increased nutrient needs related to acute illness as evidenced by estimated needs.  GOAL:   Patient will meet greater than or equal to 90% of their needs  MONITOR:   PO intake, Supplement acceptance, Labs, Weight trends  REASON FOR ASSESSMENT:   Malnutrition Screening Tool    ASSESSMENT:   71 yo male admitted with acute on chronic respiratory failure due to COPD exacerbation, acute CHF with pulmonary edema AKI. Pt with hx of COPD, HTN, chronic neck and back pain. Noted pt with hx of G-tube which was removed on 07/02/16   Pt reports good appetite prior to admission; eating 3 meals daily  No wt loss, in fact pt reports wt gain since PEG tube removal in January.  Per weight encounters, pt weight relatively stable since 04/19/16.   Nutrition-Focused physical exam completed. Some mild muscle wasting but no fat loss or edema  Labs: potassium 5.6, Creatinine wdl Meds: lasix, MVI, reglan, solumedrol  Diet Order:  Diet regular Room service appropriate? Yes; Fluid consistency: Thin  Skin:  Reviewed, no issues  Last BM:  no documented BM  Height:   Ht Readings from Last 1 Encounters:  08/20/16 5\' 8"  (1.727 m)    Weight:   Wt Readings from Last 1 Encounters:  08/20/16 200 lb 6 oz (90.9 kg)    BMI:  Body mass index is 30.47 kg/m.  Estimated Nutritional Needs:   Kcal:  1900-2200 kcals  Protein:  95-110 g   Fluid:  >/= 2L   EDUCATION NEEDS:   Education needs addressed  Romelle Starcherate Aryka Coonradt MS, RD, LDN [phone] Pager  [phone] Weekend/On-Call Pager

## 2016-08-20 NOTE — Progress Notes (Signed)
[employer] [facility] - [place] at [facility]   PATIENT NAME: Chad Huffman    MR#:  [id number]  DATE OF BIRTH:  1946/05/08  SUBJECTIVE:  CHIEF COMPLAINT:   Chief Complaint  Patient presents with  . Altered Mental Status   - complicated respiratory histroy, intubated the last 2 admissions- admitted with resp failure- off bipap today - on 3L o2 now- which is his chronic o2 requirement - feels better  REVIEW OF SYSTEMS:  Review of Systems  Constitutional: Positive for malaise/fatigue. Negative for chills and fever.  HENT: Negative for congestion, ear discharge, hearing loss and nosebleeds.   Eyes: Negative for blurred vision.  Respiratory: Positive for cough and shortness of breath. Negative for wheezing.   Cardiovascular: Negative for chest pain, palpitations and leg swelling.  Gastrointestinal: Negative for abdominal pain, constipation, diarrhea, nausea and vomiting.  Genitourinary: Negative for dysuria.  Musculoskeletal: Positive for myalgias.  Neurological: Negative for dizziness, sensory change, speech change, focal weakness, seizures and headaches.    DRUG ALLERGIES:   Allergies  Allergen Reactions  . Ampicillin Swelling and Other (See Comments)    Unable to obtain enough information to answer additional questions about this medication.  Has patient had a PCN reaction causing immediate rash, facial/tongue/throat swelling, SOB or lightheadedness with hypotension: Yes Has patient had a PCN reaction causing severe rash involving mucus membranes or skin necrosis: No Has patient had a PCN reaction that required hospitalization No Has patient had a PCN reaction occurring within the last 10 years: Yes If all of the above answers are "NO", then may pr  . Nortriptyline Other (See Comments)    Sleep walking  . Brimonidine Other (See Comments)    Unknown reaction per MAR   . Cyclobenzaprine Other (See Comments)    Hallucinations  . Tizanidine Other (See Comments)   Hallucinations  . Gabapentin Rash    VITALS:  Blood pressure (!) 181/81, pulse (!) 101, temperature 98.5 F (36.9 C), temperature source Oral, resp. rate 18, height 5\' 8"  (1.727 m), weight 94.1 kg (207 lb 7.3 oz), SpO2 94 %.  PHYSICAL EXAMINATION:  Physical Exam  GENERAL:  71 y.o.-year-old patient lying in the bed with no acute distress.  EYES: Pupils equal, round, reactive to light and accommodation. No scleral icterus. Extraocular muscles intact.  HEENT: Head atraumatic, normocephalic. Oropharynx and nasopharynx clear.  NECK:  Supple, no jugular venous distention. No thyroid enlargement, no tenderness.  LUNGS: scant breath sounds bilaterally, scattered wheezing, no rales,rhonchi or crepitation. No use of accessory muscles of respiration.  CARDIOVASCULAR: S1, S2 normal. No rubs, or gallops. 2/6 systolic murmur present. ABDOMEN: Soft, nontender, nondistended. Bowel sounds present. No organomegaly or mass.  EXTREMITIES: No pedal edema, cyanosis, or clubbing.  NEUROLOGIC: Cranial nerves II through XII are intact. Muscle strength 5/5 in all extremities. Sensation intact. Gait not checked.  PSYCHIATRIC: The patient is alert and oriented x 3.  SKIN: No obvious rash, lesion, or ulcer.    LABORATORY PANEL:   CBC  Recent Labs Lab 08/20/16 0748  WBC 5.8  HGB 10.2*  HCT 30.6*  PLT 141*   ------------------------------------------------------------------------------------------------------------------  Chemistries   Recent Labs Lab 08/19/16 1930 08/20/16 0748  NA 141 139  K 4.9 5.6*  CL 95* 96*  CO2 37* 35*  GLUCOSE 99 103*  BUN 39* 34*  CREATININE 1.33* 1.03  CALCIUM 9.0 9.0  MG  --  2.1  AST 19  --   ALT 12*  --   ALKPHOS 46  --  BILITOT 1.2  --    ------------------------------------------------------------------------------------------------------------------  Cardiac Enzymes  Recent Labs Lab 08/19/16 1930  TROPONINI <0.03    ------------------------------------------------------------------------------------------------------------------  RADIOLOGY:  Dg Chest Port 1 View  Result Date: 08/19/2016 CLINICAL DATA:  Dyspnea and weakness EXAM: PORTABLE CHEST 1 VIEW COMPARISON:  08/16/2016 FINDINGS: Cardiac shadow is within normal limits. The lungs are well aerated bilaterally. Mild right basilar scarring is again seen. Elevation of the right hemidiaphragm is again noted. IMPRESSION: No change from prior exam.  No acute abnormality noted. Electronically Signed   By: Alcide CleverMark  Lukens M.D.   On: 08/19/2016 20:14    EKG:   Orders placed or performed during the hospital encounter of 08/19/16  . ED EKG  . ED EKG  . EKG 12-Lead  . EKG 12-Lead  . EKG 12-Lead    ASSESSMENT AND PLAN:   71 y/o M With past medical history significant for chronic respiratory failure secondary to COPD on 3 L home oxygen, hypertension, depression, peripheral neuropathy, carotid stenosis, chronic neck and back pain, degenerative joint disease presents to the hospital from home secondary to acute hypoxic respiratory failure.  #1 acute hypoxic respiratory failure-secondary to COPD exacerbation. -Admitted to ICU for BiPAP. Currently on 3 L nasal cannula which is his chronic home O2 requirement. -Continue nebs, steroids. -At this time will continue BiPAP at bedtime. -Patient had recent complicated respiratory course including int[facility]ostomy in January 2018 and a repeat ICU admission requiring intubation and extubation at the time. -Monitor carefully  #2 acute diastolic heart failure-pulmonary edema on admission - ECHO with EF 60%, diastolic failure - on IV lasix for today- change to oral tomorrow  #3 Depression- zoloft, seroquel  #4 HTN- on metoprolol  #5 Chronic pain- hold pain meds for now  #6 DVT prophylaxis- lovenox  #7 Hyperkalemia- monitor as receiving IV lasix  today  Physical Therapy consult     All the records are reviewed and case discussed with Care Management/Social Workerr. Management plans discussed with the patient, family and they are in agreement.  CODE STATUS: Full Code  TOTAL TIME TAKING CARE OF THIS PATIENT: 39 minutes.   POSSIBLE D/C IN 2-3 DAYS, DEPENDING ON CLINICAL CONDITION.   Enid BaasKALISETTI,Stephaniemarie Stoffel M.D on 08/20/2016 at 1:26 PM  Between 7am to 6pm - Pager - [phone]  After 6pm go to [email or web address] - Social research officer, governmentpassword EPAS [facility]  [facility] [place] [facility]  Office  [phone]  CC: Primary care physician; Dorothey BasemanAVID BRONSTEIN, MD

## 2016-08-20 NOTE — ED Notes (Signed)
[place]

## 2016-08-20 NOTE — Progress Notes (Signed)
Pt is refusing to wear Bipap for sleep. Pt encouraged to wear Bipap but continues to refuse. Pt encouraged to call if he changes his mind about Bipap use. Rn present for conversation.

## 2016-08-21 LAB — CBC
HEMATOCRIT: 30.1 % — AB (ref 40.0–52.0)
HEMOGLOBIN: 9.9 g/dL — AB (ref 13.0–18.0)
MCH: 30.3 pg (ref 26.0–34.0)
MCHC: 33.1 g/dL (ref 32.0–36.0)
MCV: 91.7 fL (ref 80.0–100.0)
Platelets: 145 10*3/uL — ABNORMAL LOW (ref 150–440)
RBC: 3.28 MIL/uL — ABNORMAL LOW (ref 4.40–5.90)
RDW: 17.4 % — AB (ref 11.5–14.5)
WBC: 3.4 10*3/uL — AB (ref 3.8–10.6)

## 2016-08-21 LAB — BASIC METABOLIC PANEL
ANION GAP: 8 (ref 5–15)
BUN: 41 mg/dL — ABNORMAL HIGH (ref 6–20)
CALCIUM: 8.5 mg/dL — AB (ref 8.9–10.3)
CHLORIDE: 93 mmol/L — AB (ref 101–111)
CO2: 37 mmol/L — AB (ref 22–32)
CREATININE: 1.12 mg/dL (ref 0.61–1.24)
GFR calc non Af Amer: >60 mL/min (ref >60)
Glucose, Bld: 152 mg/dL — ABNORMAL HIGH (ref 65–99)
Potassium: 4.4 mmol/L (ref 3.5–5.1)
SODIUM: 138 mmol/L (ref 135–145)

## 2016-08-21 NOTE — Progress Notes (Signed)
[employer] - [place] at [facility]   PATIENT NAME: Chad Huffman    MR#:  [id number]  DATE OF BIRTH:  04/08/1946  SUBJECTIVE:  CHIEF COMPLAINT:   Chief Complaint  Patient presents with  . Altered Mental Status   - complicated respiratory histroy, intubated the last 2 admissions- admitted with resp failure- off bipap now - on 3L o2 now- which is his chronic o2 requirement - feels better. Admits of syncopal episode at home, also right lower extremity weakness, sudden, attributes it to lower back issues. Orthostatic vital signs were normal. Patient refuses BiPAP at night  REVIEW OF SYSTEMS:  Review of Systems  Constitutional: Positive for malaise/fatigue. Negative for chills and fever.  HENT: Negative for congestion, ear discharge, hearing loss and nosebleeds.   Eyes: Negative for blurred vision.  Respiratory: Positive for cough and shortness of breath. Negative for wheezing.   Cardiovascular: Negative for chest pain, palpitations and leg swelling.  Gastrointestinal: Negative for abdominal pain, constipation, diarrhea, nausea and vomiting.  Genitourinary: Negative for dysuria.  Musculoskeletal: Positive for myalgias.  Neurological: Negative for dizziness, sensory change, speech change, focal weakness, seizures and headaches.    DRUG ALLERGIES:   Allergies  Allergen Reactions  . Ampicillin Swelling and Other (See Comments)    Unable to obtain enough information to answer additional questions about this medication.  Has patient had a PCN reaction causing immediate rash, facial/tongue/throat swelling, SOB or lightheadedness with hypotension: Yes Has patient had a PCN reaction causing severe rash involving mucus membranes or skin necrosis: No Has patient had a PCN reaction that required hospitalization No Has patient had a PCN reaction occurring within the last 10 years: Yes If all of the above answers are "NO", then may pr  . Nortriptyline Other (See Comments)   Sleep walking  . Brimonidine Other (See Comments)    Unknown reaction per MAR   . Cyclobenzaprine Other (See Comments)    Hallucinations  . Tizanidine Other (See Comments)    Hallucinations  . Gabapentin Rash    VITALS:  Blood pressure (!) 120/91, pulse 75, temperature 98.2 F (36.8 C), temperature source Oral, resp. rate 18, height 5\' 8"  (1.727 m), weight 90.9 kg (200 lb 6 oz), SpO2 95 %.  PHYSICAL EXAMINATION:  Physical Exam  GENERAL:  71 y.o.-year-old patient lying in the bed with no acute distress.  EYES: Pupils equal, round, reactive to light and accommodation. No scleral icterus. Extraocular muscles intact.  HEENT: Head atraumatic, normocephalic. Oropharynx and nasopharynx clear.  NECK:  Supple, no jugular venous distention. No thyroid enlargement, no tenderness.  LUNGS: Markedly diminished breath sounds bilaterally, anteriorly, with air entrance posteriorly, no significant wheezing, rales,rhonchi or crepitations noted bilaterally .  Intermittent use of accessory muscles of respiration.  CARDIOVASCULAR: S1, S2 normal. No rubs, or gallops. 2/6 systolic murmur present. ABDOMEN: Soft, nontender, nondistended. Bowel sounds present. No organomegaly or mass.  EXTREMITIES: No pedal edema, cyanosis, or clubbing.  NEUROLOGIC: Cranial nerves II through XII are intact. Muscle strength 5/5 in all extremities. Sensation intact. Gait not checked.  PSYCHIATRIC: The patient is alert and oriented x 3.  SKIN: No obvious rash, lesion, or ulcer.    LABORATORY PANEL:   CBC  Recent Labs Lab 08/21/16 0403  WBC 3.4*  HGB 9.9*  HCT 30.1*  PLT 145*   ------------------------------------------------------------------------------------------------------------------  Chemistries   Recent Labs Lab 08/19/16 1930 08/20/16 0748 08/21/16 0403  NA 141 139 138  K 4.9 5.6* 4.4  CL 95* 96* 93*  CO2 37* 35* 37*  GLUCOSE 99 103* 152*  BUN 39* 34* 41*  CREATININE 1.33* 1.03 1.12  CALCIUM 9.0  9.0 8.5*  MG  --  2.1  --   AST 19  --   --   ALT 12*  --   --   ALKPHOS 46  --   --   BILITOT 1.2  --   --    ------------------------------------------------------------------------------------------------------------------  Cardiac Enzymes  Recent Labs Lab 08/19/16 1930  TROPONINI <0.03   ------------------------------------------------------------------------------------------------------------------  RADIOLOGY:  Dg Chest Port 1 View  Result Date: 08/19/2016 CLINICAL DATA:  Dyspnea and weakness EXAM: PORTABLE CHEST 1 VIEW COMPARISON:  08/16/2016 FINDINGS: Cardiac shadow is within normal limits. The lungs are well aerated bilaterally. Mild right basilar scarring is again seen. Elevation of the right hemidiaphragm is again noted. IMPRESSION: No change from prior exam.  No acute abnormality noted. Electronically Signed   By: Alcide CleverMark  Lukens M.D.   On: 08/19/2016 20:14    EKG:   Orders placed or performed during the hospital encounter of 08/19/16  . ED EKG  . ED EKG  . EKG 12-Lead  . EKG 12-Lead  . EKG 12-Lead    ASSESSMENT AND PLAN:   71 y/o M With past medical history significant for chronic respiratory failure secondary to COPD on 3 L home oxygen, hypertension, depression, peripheral neuropathy, carotid stenosis, chronic neck and back pain, degenerative joint disease presents to the hospital from home secondary to acute hypoxic respiratory failure.  #1 acute Of chronic hypercapnic hypoxic respiratory failure-secondary to COPD exacerbation. -Admitted to ICU for BiPAP. Currently on 3 L nasal cannula which is his chronic home O2 requirement. -Continue nebs, steroids. -The patient refuses  BiPAP at bedtime, Discontinue order. -Patient had recent complicated respiratory course including intubation [facility], [facility]ostomy in January 2018 and a repeat ICU admission requiring intubation and extubation at the time. -Monitor  carefully  #2 acute diastolic heart failure-pulmonary edema on admission - ECHO with EF 60%, diastolic failure - on IV lasix . On admission, now on Demadex orally , no orthostatic hypotension   #3 Depression- zoloft, seroquel  #4 HTN- on metoprolol, Good control   #5 Chronic pain, resume pain meds upon discharge   #6 DVT prophylaxis- lovenox  #7 Hyperkalemia, resolved on diuretics   #8. Pancytopenia, follow in the morning   #9. Generalized weakness, physical Therapist recommended skilled nursing facility placement and social worker involved for placement    All the records are reviewed and case discussed with Care Management/Social Workerr. Management plans discussed with the patient, family and they are in agreement.  CODE STATUS: Full Code  TOTAL TIME TAKING CARE OF THIS PATIENT: 40 minutes.   POSSIBLE D/C IN 2-3 DAYS, DEPENDING ON CLINICAL CONDITION.   Katharina CaperVAICKUTE,[place]ck [place] M.D on 08/21/2016 at 11:42 AM  Between 7am to 6pm - Pager - [phone]  After 6pm go to [email or web address] - Social research officer, governmentpassword EPAS [facility]  [facility] [place] [facility]  Office  [phone]  CC: Primary care physician; Dorothey BasemanAVID BRONSTEIN, MD

## 2016-08-21 NOTE — Clinical Social Work Note (Signed)
Clinical Social Work Assessment  Patient Details  Name: Chad Huffman MRN: [id number] Date of Birth: 12-11-1945  Date of referral:  08/21/16               Reason for consult:  Facility Placement                Permission sought to share information with:  Chartered certified accountant granted to share information::  Yes, Verbal Permission Granted  Name::        Agency::     Relationship::     Contact Information:     Housing/Transportation Living arrangements for the past 2 months:  Single Family Home Source of Information:  Patient Patient Interpreter Needed:  None Criminal Activity/Legal Involvement Pertinent to Current Situation/Hospitalization:  No - Comment as needed Significant Relationships:  Siblings Lives with:  Self Do you feel safe going back to the place where you live?  Yes Need for family participation in patient care:  No (Coment)  Care giving concerns:  PT rec for STR   Social Worker assessment / plan:  CSW met with patient at bedside to discuss dc planning. The patient indicated that he is in agreement with STR; however, he is not sure how many Medicare days he has used this year. The patient would like to return to [facility] if possible. The CSW advised him of the potential that he would have to pay out of pocket.  CSW sending referral for care with a note to check on his Medicare days.  Employment status:  Retired Forensic scientist:  [employer] PT Recommendations:  [place] / Referral to community resources:  [place]  Patient/Family's Response to care:  Patient thanked CSW.  Patient/Family's Understanding of and Emotional Response to Diagnosis, Current Treatment, and Prognosis:  The patient seems to resist using o2 while sleeping which will continue causing his hypoxic events.   Emotional Assessment Appearance:  Appears stated age Attitude/Demeanor/Rapport:  Apprehensive Affect (typically observed):   Apprehensive, Pleasant, Appropriate Orientation:  Oriented to Self, Oriented to Place, Oriented to  Time, Oriented to Situation Alcohol / Substance use:  Never Used Psych involvement (Current and /or in the community):  No (Comment)  Discharge Needs  Concerns to be addressed:  Compliance Issues Concerns (Patient won't wear bi-pap at night) Readmission within the last 30 days:  Yes Current discharge risk:  Chronically ill Barriers to Discharge:  Continued Medical Work up   [employer], LCSW 08/21/2016, 4:05 PM

## 2016-08-22 ENCOUNTER — Inpatient Hospital Stay: Payer: Medicare Other

## 2016-08-22 DIAGNOSIS — I6521 Occlusion and stenosis of right carotid artery: Secondary | ICD-10-CM

## 2016-08-22 LAB — CBC
HEMATOCRIT: 30.8 % — AB (ref 40.0–52.0)
HEMOGLOBIN: 10.1 g/dL — AB (ref 13.0–18.0)
MCH: 29.8 pg (ref 26.0–34.0)
MCHC: 32.6 g/dL (ref 32.0–36.0)
MCV: 91.4 fL (ref 80.0–100.0)
Platelets: 167 10*3/uL (ref 150–440)
RBC: 3.37 MIL/uL — ABNORMAL LOW (ref 4.40–5.90)
RDW: 17 % — ABNORMAL HIGH (ref 11.5–14.5)
WBC: 7.1 10*3/uL (ref 3.8–10.6)

## 2016-08-22 LAB — CO2, TOTAL: CO2: 40 mmol/L — ABNORMAL HIGH (ref 22–32)

## 2016-08-22 MED ORDER — SODIUM CHLORIDE 0.9 % IV SOLN
INTRAVENOUS | Status: DC
  Administered 2016-08-22 – 2016-08-23 (×2): via INTRAVENOUS

## 2016-08-22 MED ORDER — IOPAMIDOL (ISOVUE-370) INJECTION 76%
75.0000 mL | Freq: Once | INTRAVENOUS | Status: AC | PRN
  Administered 2016-08-22: 75 mL via INTRAVENOUS

## 2016-08-22 NOTE — Consult Note (Signed)
Reason for Consult: Right ICA stenosis Referring Physician: Dr. Truett Mainland is an 71 y.o. male.  HPI: Pleasant gentleman history of EVAR and TIA 2 years ago with resultant right peripheral visual deficit. He has significant COPD with recent intubation. In addition he has had 3-4 syncopal episodes. He states he was ambulating with each episode and had no associated symptoms. No speech or visual changes, no weakness in extremities, no dizziness.  He was found to have high grade Carotid artery stenosis on imaging this admission.  Past Medical History:  Diagnosis Date  . Antalgic gait   . Carotid artery occlusion   . Chronic neck and back pain   . Complication of anesthesia    pt has had cervical fusion-limited neck flextion  . COPD (chronic obstructive pulmonary disease) ([place])   . Depression   . DJD (degenerative joint disease)    Right shoulder  . Hypertension   . Lumbago   . Peripheral neuropathy [facility])     Past Surgical History:  Procedure Laterality Date  . ABDOMINAL AORTIC ANEURYSM REPAIR  2009   Endovascular AAA repair  . ABDOMINAL AORTIC ANEURYSM REPAIR    . CARPAL TUNNEL RELEASE     bilateral CTS  . HAMMER TOE SURGERY  12/10/2011   Procedure: HAMMER TOE CORRECTION;  Surgeon: Tyson Dense, DPM;  Location: [place];  Service: Podiatry;  Laterality: Right;  Right hammertoe repair second right with 2.5x42 orthopro screw   . IR GENERIC HISTORICAL  04/05/2016   IR GASTROSTOMY TUBE MOD SED 04/05/2016 [facility]  . IR GENERIC HISTORICAL  05/12/2016   IR CM INJ ANY COLONIC TUBE W/FLUORO 05/12/2016 Jacqulynn Cadet, MD [facility]  . IR GENERIC HISTORICAL  05/26/2016   IR CM INJ ANY COLONIC TUBE W/FLUORO 05/26/2016 [facility]  . IR GENERIC HISTORICAL  05/28/2016   IR [place] GASTRO/COLONIC TUBE PERCUT W/FLUORO 05/28/2016 Ascencion Dike, PA-C [facility]  . IR GENERIC HISTORICAL  07/02/2016   IR GASTROSTOMY TUBE REMOVAL 07/02/2016 Ascencion Dike, PA-C  [facility]  . METATARSAL OSTEOTOMY  12/10/2011   Procedure: METATARSAL OSTEOTOMY;  Surgeon: Tyson Dense, DPM;  Location: [place];  Service: Podiatry;  Laterality: Right;  Right Keller arthroplasty with size 5 silicone implant right great toe; second metatarsal osteotomy with 2.0x14 screw  . NECK SURGERY    . PERIPHERAL VASCULAR CATHETERIZATION N/A 02/11/2015   Procedure: Carotid Angiography;  Surgeon: Serafina Mitchell, MD;  Location: [place] [facility];  Service: Cardiovascular;  Laterality: N/A;  . PILONIDAL CYST / SINUS EXCISION    . Rotary cuff    . SPINE SURGERY  09/15/11   Scar tissue removed- back  . SPINE SURGERY  09/15/11   Scar tissue removed- back  . TONSILLECTOMY    . TRACHEOSTOMY TUBE PLACEMENT N/A 03/03/2016   Procedure: TRACHEOSTOMY;  Surgeon: Clyde Canterbury, MD;  Location: [facility];  Service: ENT;  Laterality: N/A;    Family History  Problem Relation Age of Onset  . Stomach cancer Mother   . Hypertension Brother   . Heart attack Father     Social History:  reports that he quit smoking about 9 years ago. He has never used smokeless tobacco. He reports that he drinks about 1.2 oz of alcohol per week . He reports that he does not use drugs.  Allergies:  Allergies  Allergen Reactions  . Ampicillin Swelling and Other (See Comments)    Unable to obtain enough information to answer additional  questions about this medication.  Has patient had a PCN reaction causing immediate rash, facial/tongue/throat swelling, SOB or lightheadedness with hypotension: Yes Has patient had a PCN reaction causing severe rash involving mucus membranes or skin necrosis: No Has patient had a PCN reaction that required hospitalization No Has patient had a PCN reaction occurring within the last 10 years: Yes If all of the above answers are "NO", then may pr  . Nortriptyline Other (See Comments)    Sleep walking  . Brimonidine Other (See Comments)    Unknown reaction per MAR   .  Cyclobenzaprine Other (See Comments)    Hallucinations  . Tizanidine Other (See Comments)    Hallucinations  . Gabapentin Rash    Medications: I have reviewed the patient's current medications.  Results for orders placed or performed during the hospital encounter of 08/19/16 (from the past 48 hour(s))  Influenza panel by PCR (type A & B)     Status: None   Collection Time: 08/20/16  7:34 PM  Result Value Ref Range   Influenza A By PCR NEGATIVE NEGATIVE   Influenza B By PCR NEGATIVE NEGATIVE    Comment: (NOTE) The Xpert Xpress Flu assay is intended as an aid in the diagnosis of  influenza and should not be used as a sole basis for treatment.  This  assay is FDA approved for nasopharyngeal swab specimens only. Nasal  washings and aspirates are unacceptable for Xpert Xpress Flu testing.   CBC     Status: Abnormal   Collection Time: 08/21/16  4:03 AM  Result Value Ref Range   WBC 3.4 (L) 3.8 - 10.6 K/uL   RBC 3.28 (L) 4.40 - 5.90 MIL/uL   Hemoglobin 9.9 (L) 13.0 - 18.0 g/dL   HCT 30.1 (L) 40.0 - 52.0 %   MCV 91.7 80.0 - 100.0 fL   MCH 30.3 26.0 - 34.0 pg   MCHC 33.1 32.0 - 36.0 g/dL   RDW 17.4 (H) 11.5 - 14.5 %   Platelets 145 (L) 150 - 440 K/uL  Basic metabolic panel     Status: Abnormal   Collection Time: 08/21/16  4:03 AM  Result Value Ref Range   Sodium 138 135 - 145 mmol/L   Potassium 4.4 3.5 - 5.1 mmol/L   Chloride 93 (L) 101 - 111 mmol/L   CO2 37 (H) 22 - 32 mmol/L   Glucose, Bld 152 (H) 65 - 99 mg/dL   BUN 41 (H) 6 - 20 mg/dL   Creatinine, Ser 1.12 0.61 - 1.24 mg/dL   Calcium 8.5 (L) 8.9 - 10.3 mg/dL   GFR calc non Af Amer >60 >60 mL/min   GFR calc Af Amer >60 >60 mL/min    Comment: (NOTE) The eGFR has been calculated using the CKD EPI equation. This calculation has not been validated in all clinical situations. eGFR's persistently <60 mL/min signify possible Chronic Kidney Disease.    Anion gap 8 5 - 15  CBC     Status: Abnormal   Collection Time: 08/22/16   4:42 AM  Result Value Ref Range   WBC 7.1 3.8 - 10.6 K/uL   RBC 3.37 (L) 4.40 - 5.90 MIL/uL   Hemoglobin 10.1 (L) 13.0 - 18.0 g/dL   HCT 30.8 (L) 40.0 - 52.0 %   MCV 91.4 80.0 - 100.0 fL   MCH 29.8 26.0 - 34.0 pg   MCHC 32.6 32.0 - 36.0 g/dL   RDW 17.0 (H) 11.5 - 14.5 %   Platelets  167 150 - 440 K/uL  CO2, total     Status: Abnormal   Collection Time: 08/22/16  4:42 AM  Result Value Ref Range   CO2 40 (H) 22 - 32 mmol/L    Ct Angio Head W Or Wo Contrast  Result Date: 08/22/2016 CLINICAL DATA:  Syncope.  Abnormal Doppler. EXAM: CT ANGIOGRAPHY HEAD AND NECK TECHNIQUE: Multidetector CT imaging of the head and neck was performed using the standard protocol during bolus administration of intravenous contrast. Multiplanar CT image reconstructions and MIPs were obtained to evaluate the vascular anatomy. Carotid stenosis measurements (when applicable) are obtained utilizing NASCET criteria, using the distal internal carotid diameter as the denominator. CONTRAST:  Study 5 cc Isovue 370 intravenous COMPARISON:  07/16/2016 head CT FINDINGS: CT HEAD FINDINGS Brain: No evidence of acute infarction, hemorrhage, hydrocephalus, extra-axial collection or mass lesion/mass effect. 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 Sulcal calcifications along the right cerebral convexity, unilateral. Vascular: Atherosclerotic calcification. Skull: Negative Sinuses: Negative Orbits: Negative Review of the MIP images confirms the above findings CTA NECK FINDINGS Aortic arch: No acute finding. Atherosclerosis. Three vessel branching Right carotid system: Mild calcified atherosclerotic plaque at the brachiocephalic bifurcation. Bulky calcified plaque with irregular appearance at the ICA bulb. The distal portion of the plaque, and associated lumen, is obscured by streak artifact from dental amalgam. Quantification of the related stenosis is limited due to calcified plaque blooming and irregular  shape, but narrowing is presumably advanced - correlating with Doppler findings. The plaque is irregular in shape. No signs of dissection Left carotid system: Atheromatous wall thickening of the common carotid and at the bifurcation. There is an ulceration or web at the bulb, best seen on sagittal reformats. Stenosis measures up to 50% at the level of the luminal ridge. Vertebral arteries: No flow limiting stenosis in the proximal subclavian arteries. Strong left vertebral artery dominance. Calcified plaque at the V1 and V2 segments on the left without flow limiting stenosis. Duplicated V3 segment on the left. Skeleton: Atlantooccipital non segmentation. Posterior fusion from the occipital skull to T2. Diffuse posterior decompression of the cervical spine. No acute osseous finding. Other neck: Marked fatty atrophy of rotator cuff muscles. Changes of prior tracheostomy. Upper chest: Paraseptal emphysema. Paramediastinal right upper lobe scarring. The visible main pulmonary artery appears dilated. Review of the MIP images confirms the above findings CTA HEAD FINDINGS Anterior circulation: Diffuse calcified plaque on the carotid siphons. No flow limiting stenosis or branch occlusion seen in the anterior circulation. Negative for aneurysm. Posterior circulation: Strongly dominant left vertebral artery, the right vertebral artery essentially ends in PICA. Fenestrated left V3 segment. Mild left V4 segment atheromatous narrowing. No major branch occlusion. Basilar and posterior cerebral arteries are diffusely patent. Venous sinuses: Patent Anatomic variants: None unusual Delayed phase: Negative for abnormal intracranial enhancement. Review of the MIP images confirms the above findings IMPRESSION: 1. Bulky calcified plaque at the proximal right ICA to a degree that the lumen is not measurable. Implied flow limiting stenosis. 2. Ridge like structure at the left ICA bulb from ulceration or web causes 50% luminal narrowing. 3.  Strongly dominant left vertebral artery. No flow limiting stenosis noted in the posterior or intracranial circulation. 4. Multiple asymmetric calcifications in right cerebral sulci, suspect remote calcified plaque embolus. 5. Small remote right frontal infarct. Electronically Signed   By: Monte Fantasia M.D.   On: 08/22/2016 13:25   Ct Angio Neck W Or Wo Contrast  Result Date: 08/22/2016 CLINICAL DATA:  Syncope.  Abnormal Doppler. EXAM: CT ANGIOGRAPHY HEAD AND NECK TECHNIQUE: Multidetector CT imaging of the head and neck was performed using the standard protocol during bolus administration of intravenous contrast. Multiplanar CT image reconstructions and MIPs were obtained to evaluate the vascular anatomy. Carotid stenosis measurements (when applicable) are obtained utilizing NASCET criteria, using the distal internal carotid diameter as the denominator. CONTRAST:  Study 5 cc Isovue 370 intravenous COMPARISON:  07/16/2016 head CT FINDINGS: CT HEAD FINDINGS Brain: No evidence of acute infarction, hemorrhage, hydrocephalus, extra-axial collection or mass lesion/mass effect. 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 Sulcal calcifications along the right cerebral convexity, unilateral. Vascular: Atherosclerotic calcification. Skull: Negative Sinuses: Negative Orbits: Negative Review of the MIP images confirms the above findings CTA NECK FINDINGS Aortic arch: No acute finding. Atherosclerosis. Three vessel branching Right carotid system: Mild calcified atherosclerotic plaque at the brachiocephalic bifurcation. Bulky calcified plaque with irregular appearance at the ICA bulb. The distal portion of the plaque, and associated lumen, is obscured by streak artifact from dental amalgam. Quantification of the related stenosis is limited due to calcified plaque blooming and irregular shape, but narrowing is presumably advanced - correlating with Doppler findings. The plaque is irregular in  shape. No signs of dissection Left carotid system: Atheromatous wall thickening of the common carotid and at the bifurcation. There is an ulceration or web at the bulb, best seen on sagittal reformats. Stenosis measures up to 50% at the level of the luminal ridge. Vertebral arteries: No flow limiting stenosis in the proximal subclavian arteries. Strong left vertebral artery dominance. Calcified plaque at the V1 and V2 segments on the left without flow limiting stenosis. Duplicated V3 segment on the left. Skeleton: Atlantooccipital non segmentation. Posterior fusion from the occipital skull to T2. Diffuse posterior decompression of the cervical spine. No acute osseous finding. Other neck: Marked fatty atrophy of rotator cuff muscles. Changes of prior tracheostomy. Upper chest: Paraseptal emphysema. Paramediastinal right upper lobe scarring. The visible main pulmonary artery appears dilated. Review of the MIP images confirms the above findings CTA HEAD FINDINGS Anterior circulation: Diffuse calcified plaque on the carotid siphons. No flow limiting stenosis or branch occlusion seen in the anterior circulation. Negative for aneurysm. Posterior circulation: Strongly dominant left vertebral artery, the right vertebral artery essentially ends in PICA. Fenestrated left V3 segment. Mild left V4 segment atheromatous narrowing. No major branch occlusion. Basilar and posterior cerebral arteries are diffusely patent. Venous sinuses: Patent Anatomic variants: None unusual Delayed phase: Negative for abnormal intracranial enhancement. Review of the MIP images confirms the above findings IMPRESSION: 1. Bulky calcified plaque at the proximal right ICA to a degree that the lumen is not measurable. Implied flow limiting stenosis. 2. Ridge like structure at the left ICA bulb from ulceration or web causes 50% luminal narrowing. 3. Strongly dominant left vertebral artery. No flow limiting stenosis noted in the posterior or intracranial  circulation. 4. Multiple asymmetric calcifications in right cerebral sulci, suspect remote calcified plaque embolus. 5. Small remote right frontal infarct. Electronically Signed   By: Monte Fantasia M.D.   On: 08/22/2016 13:25   US Carotid Bilateral  Result Date: 08/22/2016 CLINICAL DATA:  Syncope, hypertension, right eye visual disturbance. EXAM: BILATERAL CAROTID DUPLEX ULTRASOUND TECHNIQUE: Pearline Cables scale imaging, color Doppler and duplex ultrasound were performed of bilateral carotid and vertebral arteries in the neck. COMPARISON:  Report from a prior study at [facility] at [facility] on 09/22/2015 FINDINGS: Criteria: Quantification of carotid stenosis is based on velocity  parameters that correlate the residual internal carotid diameter with NASCET-based stenosis levels, using the diameter of the distal internal carotid lumen as the denominator for stenosis measurement. The following velocity measurements were obtained: RIGHT ICA:  316/78 cm/sec CCA:  51/76 cm/sec SYSTOLIC ICA/CCA RATIO:  3.5 DIASTOLIC ICA/CCA RATIO:  5.6 ECA:   cm/sec LEFT ICA:  128/20 cm/sec CCA:  160/73 cm/sec SYSTOLIC ICA/CCA RATIO:  1.2 DIASTOLIC ICA/CCA RATIO:  0.9 ECA:  288 cm/sec RIGHT CAROTID ARTERY: Common and internal carotid arteries are tortuous. There is a significant amount of calcified plaque at the level of the distal bulb and proximal ICA. Based on velocity elevation, estimated ICA stenosis is estimated to be greater than 70%. RIGHT VERTEBRAL ARTERY: The right vertebral artery was not visualized. LEFT CAROTID ARTERY: Moderate amount of predominately calcified plaque at the level of the carotid bulb and proximal left ICA. Based on velocities, estimated left ICA stenosis is 50- 69%. LEFT VERTEBRAL ARTERY: Antegrade flow with normal waveform and velocity. IMPRESSION: Significant atherosclerotic plaque at both carotid bifurcations, right greater than left. Estimated right ICA stenosis is greater than 70%. Estimated left ICA  stenosis is 50- 69%. Electronically Signed   By: Aletta Edouard M.D.   On: 08/22/2016 13:51    Review of Systems  HENT: Negative.   Eyes: Negative for blurred vision and double vision.  Respiratory: Positive for shortness of breath.   Cardiovascular: Positive for orthopnea. Negative for chest pain.  Gastrointestinal: Negative.   Musculoskeletal: Positive for falls and neck pain.  Skin: Negative.   Neurological: Positive for loss of consciousness. Negative for dizziness, sensory change, speech change, focal weakness, seizures and headaches.  All other systems reviewed and are negative.  Blood pressure 136/76, pulse (!) 59, temperature 97.6 F (36.4 C), temperature source Oral, resp. rate 18, height _0  (1.727 m), weight 90.9 kg (200 lb 6 oz), SpO2 99 %. Physical Exam  Nursing note and vitals reviewed. Constitutional: He is oriented to person, place, and time. He appears well-developed and well-nourished.  HENT:  Head: Normocephalic and atraumatic.  Eyes: Pupils are equal, round, and reactive to light.  Cardiovascular: Normal rate, regular rhythm and intact distal pulses.   Right Carotid bruit  Respiratory: No stridor. No respiratory distress.  Respiratory effort  GI: Soft. He exhibits no distension. There is no tenderness.  Musculoskeletal: Normal range of motion. He exhibits no edema or tenderness.  Lymphadenopathy:    He has no cervical adenopathy.  Neurological: He is alert and oriented to person, place, and time. No cranial nerve deficit.  Skin: Skin is warm.    Assessment/Plan:  Moderate/Severe Right ICA stenosis.  His syncopal episodes are not clearly from his vascular disease but may have a component of his cervical neurologic/musculoskeletal issues.  Nonetheless, with the CTA/Duplex findings and history of Right eye peripheral vision loss he should have further evaluation and possible intervention in the near future.  Patient has a Vascular Surgeon in [place] [facility] that has been following him closely and his next appointment was scheduled for this upcoming week.  He should follow with them as soon as possible for evaluation and potential intervention.  Recommend ASA daily  Taeja Debellis A 08/22/2016, 6:30 PM

## 2016-08-22 NOTE — Progress Notes (Signed)
[facility] [place] [facility]   PATIENT NAME: Chad PaddockDanny Findlay    MR#:  [id number]  DATE OF BIRTH:  Dec 07, 1945  SUBJECTIVE:  CHIEF COMPLAINT:   Chief Complaint  Patient presents with  . Altered Mental Status   - complicated respiratory histroy, intubated the last 2 admissions- admitted with resp failure- off bipap now - on 3L o2 now- which is his chronic o2 requirement - feels better. Admits of syncopal episode at home, also right lower extremity weakness, sudden, attributes it to lower back issues. Orthostatic vital signs were normal. Patient refuses BiPAP at night The patient feels better today, denies any significant shortness of breath. Admits of recurrent syncopal episodes, at least 4 times over the past few months. Ultrasound of carotid arteries revealed right ICA stenosis with some flow, right vertebral artery was not visualized. Some plaque formation on the left. REVIEW OF SYSTEMS:  Review of Systems  Constitutional: Positive for malaise/fatigue. Negative for chills and fever.  HENT: Negative for congestion, ear discharge, hearing loss and nosebleeds.   Eyes: Negative for blurred vision.  Respiratory: Positive for cough and shortness of breath. Negative for wheezing.   Cardiovascular: Negative for chest pain, palpitations and leg swelling.  Gastrointestinal: Negative for abdominal pain, constipation, diarrhea, nausea and vomiting.  Genitourinary: Negative for dysuria.  Musculoskeletal: Positive for myalgias.  Neurological: Negative for dizziness, sensory change, speech change, focal weakness, seizures and headaches.    DRUG ALLERGIES:   Allergies  Allergen Reactions  . Ampicillin Swelling and Other (See Comments)    Unable to obtain enough information to answer additional questions about this medication.  Has patient had a PCN reaction causing immediate rash, facial/tongue/throat swelling, SOB or lightheadedness with hypotension: Yes Has patient had a  PCN reaction causing severe rash involving mucus membranes or skin necrosis: No Has patient had a PCN reaction that required hospitalization No Has patient had a PCN reaction occurring within the last 10 years: Yes If all of the above answers are "NO", then may pr  . Nortriptyline Other (See Comments)    Sleep walking  . Brimonidine Other (See Comments)    Unknown reaction per MAR   . Cyclobenzaprine Other (See Comments)    Hallucinations  . Tizanidine Other (See Comments)    Hallucinations  . Gabapentin Rash    VITALS:  Blood pressure 136/76, pulse (!) 59, temperature 97.6 F (36.4 C), temperature source Oral, resp. rate 18, height 5\' 8"  (1.727 m), weight 90.9 kg (200 lb 6 oz), SpO2 99 %.  PHYSICAL EXAMINATION:  Physical Exam  GENERAL:  71 y.o.-year-old patient lying in the bed with no acute distress.  EYES: Pupils equal, round, reactive to light and accommodation. No scleral icterus. Extraocular muscles intact.  HEENT: Head atraumatic, normocephalic. Oropharynx and nasopharynx clear.  NECK:  Supple, no jugular venous distention. No thyroid enlargement, no tenderness.  LUNGS: Some diminished breath sounds bilaterally, anteriorly, with air entrance posteriorly, no significant wheezing, rales,rhonchi or crepitations noted bilaterally .  Intermittent use of accessory muscles of respiration.  CARDIOVASCULAR: S1, S2 normal. No rubs, or gallops. 2/6 systolic murmur present. ABDOMEN: Soft, nontender, nondistended. Bowel sounds present. No organomegaly or mass.  EXTREMITIES: No pedal edema, cyanosis, or clubbing.  NEUROLOGIC: Cranial nerves II through XII are intact. Muscle strength 5/5 in all extremities. Sensation intact. Gait not checked.  PSYCHIATRIC: The patient is alert and oriented x 3.  SKIN: No obvious rash, lesion, or ulcer.    LABORATORY PANEL:   CBC  Recent Labs Lab 08/22/16 0442  WBC 7.1  HGB 10.1*  HCT 30.8*  PLT 167    ------------------------------------------------------------------------------------------------------------------  Chemistries   Recent Labs Lab 08/19/16 1930 08/20/16 0748 08/21/16 0403 08/22/16 0442  NA 141 139 138  --   K 4.9 5.6* 4.4  --   CL 95* 96* 93*  --   CO2 37* 35* 37* 40*  GLUCOSE 99 103* 152*  --   BUN 39* 34* 41*  --   CREATININE 1.33* 1.03 1.12  --   CALCIUM 9.0 9.0 8.5*  --   MG  --  2.1  --   --   AST 19  --   --   --   ALT 12*  --   --   --   ALKPHOS 46  --   --   --   BILITOT 1.2  --   --   --    ------------------------------------------------------------------------------------------------------------------  Cardiac Enzymes  Recent Labs Lab 08/19/16 1930  TROPONINI <0.03   ------------------------------------------------------------------------------------------------------------------  RADIOLOGY:  No results found.  EKG:   Orders placed or performed during the hospital encounter of 08/19/16  . ED EKG  . ED EKG  . EKG 12-Lead  . EKG 12-Lead  . EKG 12-Lead    ASSESSMENT AND PLAN:   71 y/o M With past medical history significant for chronic respiratory failure secondary to COPD on 3 L home oxygen, hypertension, depression, peripheral neuropathy, carotid stenosis, chronic neck and back pain, degenerative joint disease presents to the hospital from home secondary to acute hypoxic respiratory failure.  #1 acute Of chronic hypercapnic hypoxic respiratory failure-secondary to COPD exacerbation. -Admitted to ICU for BiPAP. Currently on 3 L nasal cannula which is his chronic home O2 requirement.Patient's CO2 level is increasing, respiratory therapist was asked to decrease oxygen flow -Continue nebs, steroids. -The patient refuses  BiPAP at bedtime, Discontinued order. -Patient had recent complicated respiratory course including intubation requiring tracheostomy in September 2017 going to [facility], reverse tracheostomy in January 2018 and a repeat  ICU admission requiring intubation and extubation at the time. -Monitoring carefully, likely discharge on Monday to skilled nursing facility for rehabilitation  #2 acute diastolic heart failure-pulmonary edema on admission - ECHO with EF 60%, diastolic failure - the patient was on IV lasix . On admission, now on Demadex orally , no orthostatic hypotension   #3 Depression- zoloft, seroquel  #4 HTN- on metoprolol, Good control   #5 Chronic pain, resume pain meds upon discharge   #6 DVT prophylaxis- lovenox  #7 Hyperkalemia, resolved on diuretics   #8. Pancytopenia, resolved  #9. Generalized weakness, physical Therapist recommended skilled nursing facility placement and social worker involved for placement  #10. Recurrent syncopal episodes, carotid ultrasound was remarkable for right ICA stenosis, nonvisualized right vertebral artery, getting CT angiogram of the neck and head to rule out significant obstruction on flow abnormalities. IV fluids due to contrast. Her cardiogram in November 2017, unremarkable  All the records are reviewed and case discussed with Care Management/Social Workerr. Management plans discussed with the patient, family and they are in agreement.  CODE STATUS: Full Code  TOTAL TIME TAKING CARE OF THIS PATIENT: 40 minutes.   POSSIBLE D/C IN 2-3 DAYS, DEPENDING ON CLINICAL CONDITION.   Katharina Caper M.D on 08/22/2016 at 11:59 AM  Between 7am to 6pm - Pager - [phone]  After 6pm go to [email or web address] - Social research officer, government  [facility]  Office  [phone]  CC: Primary care physician; Dorothey Baseman, MD

## 2016-08-23 DIAGNOSIS — D61818 Other pancytopenia: Secondary | ICD-10-CM

## 2016-08-23 DIAGNOSIS — I6529 Occlusion and stenosis of unspecified carotid artery: Secondary | ICD-10-CM

## 2016-08-23 DIAGNOSIS — E875 Hyperkalemia: Secondary | ICD-10-CM

## 2016-08-23 LAB — BASIC METABOLIC PANEL
Anion gap: 6 (ref 5–15)
BUN: 45 mg/dL — AB (ref 6–20)
CALCIUM: 8.9 mg/dL (ref 8.9–10.3)
CO2: 40 mmol/L — ABNORMAL HIGH (ref 22–32)
CREATININE: 1.01 mg/dL (ref 0.61–1.24)
Chloride: 92 mmol/L — ABNORMAL LOW (ref 101–111)
GFR calc Af Amer: >60 mL/min (ref >60)
Glucose, Bld: 124 mg/dL — ABNORMAL HIGH (ref 65–99)
POTASSIUM: 4.8 mmol/L (ref 3.5–5.1)
Sodium: 138 mmol/L (ref 135–145)

## 2016-08-23 MED ORDER — FLEET ENEMA 7-19 GM/118ML RE ENEM
1.0000 | ENEMA | Freq: Once | RECTAL | Status: AC
  Administered 2016-08-23: 1 via RECTAL

## 2016-08-23 MED ORDER — MAGNESIUM HYDROXIDE 400 MG/5ML PO SUSP
30.0000 mL | Freq: Once | ORAL | Status: AC
  Administered 2016-08-23: 30 mL via ORAL
  Filled 2016-08-23: qty 30

## 2016-08-23 MED ORDER — FOLIC ACID 1 MG PO TABS: 1.0000 mg | ORAL_TABLET | Freq: Every day | ORAL | Status: DC

## 2016-08-23 MED ORDER — SERTRALINE HCL 50 MG PO TABS: 75.0000 mg | ORAL_TABLET | Freq: Every day | ORAL | Status: DC

## 2016-08-23 MED ORDER — METOCLOPRAMIDE HCL 5 MG PO TABS: 2.5000 mg | ORAL_TABLET | Freq: Four times a day (QID) | ORAL | 3 refills | Status: DC | PRN

## 2016-08-23 MED ORDER — OXYCODONE HCL 5 MG PO TABS: 5.0000 mg | ORAL_TABLET | Freq: Three times a day (TID) | ORAL | Status: DC | PRN

## 2016-08-23 MED ORDER — CLONAZEPAM 0.5 MG PO TABS: 0.5000 mg | ORAL_TABLET | Freq: Two times a day (BID) | ORAL | Status: DC | PRN

## 2016-08-23 MED ORDER — OXYCODONE HCL 5 MG PO TABS: 5.0000 mg | ORAL_TABLET | Freq: Three times a day (TID) | ORAL | 0 refills | Status: DC

## 2016-08-23 MED ORDER — ASPIRIN EC 325 MG PO TBEC: 325.0000 mg | DELAYED_RELEASE_TABLET | Freq: Every day | ORAL | 0 refills | Status: DC

## 2016-08-23 MED ORDER — PREDNISONE 10 MG (21) PO TBPK: ORAL_TABLET | ORAL | 0 refills | Status: DC

## 2016-08-23 MED ORDER — METOCLOPRAMIDE HCL 5 MG PO TABS
2.5000 mg | ORAL_TABLET | Freq: Four times a day (QID) | ORAL | Status: DC | PRN
  Filled 2016-08-23: qty 0.5

## 2016-08-23 MED ORDER — CLONAZEPAM 0.5 MG PO TABS: 0.5000 mg | ORAL_TABLET | Freq: Two times a day (BID) | ORAL | 0 refills | Status: DC

## 2016-08-23 NOTE — Clinical Social Work Note (Addendum)
Pt is ready for discharge today and [facility]. Pt has 15 medicare days left and he understands that once he is at day 100, he will be private pay. Facility is ready to admit pt as they have received discharge information. Pt shared that he will contact his brother regarding discharge. RN will call report. [facility] will [facility]. CSW is signing off as no further needs identified.   Dede QuerySarah Camiah Humm, MSW, LCSW  Clinical Social Worker  [phone]  ADDENDUM: Pt changed his mind, and would like to go to [facility]. Facility received discharge information and is able to accept pt. RN will call report to [facility]. [facility]. CSW is signing off.  Dede QuerySarah Markas [place], MSW, LCSW

## 2016-08-23 NOTE — Discharge Summary (Addendum)
[facility] [place] at [facility]   PATIENT NAME: Chad Huffman    MR#:  [id number]  DATE OF BIRTH:  03-20-1946  DATE OF ADMISSION:  08/19/2016 ADMITTING PHYSICIAN: Jon Gills Hugelmeyer, DO  DATE OF DISCHARGE: No discharge date for patient encounter.  PRIMARY CARE PHYSICIAN: Dorothey Baseman, MD     ADMISSION DIAGNOSIS:  Acute respiratory failure with hypoxia and hypercapnia (HCC) [J96.01, J96.02]  DISCHARGE DIAGNOSIS:  Principal Problem:   Acute on chronic respiratory failure with hypoxia and hypercapnia (HCC) Active Problems:   Syncope   Carotid artery stenosis   Acute on chronic diastolic CHF (congestive heart failure) (HCC)   Hyperkalemia   Pancytopenia (HCC)   SECONDARY DIAGNOSIS:   Past Medical History:  Diagnosis Date  . Antalgic gait   . Carotid artery occlusion   . Chronic neck and back pain   . Complication of anesthesia    pt has had cervical fusion-limited neck flextion  . COPD (chronic obstructive pulmonary disease) (HCC)   . Depression   . DJD (degenerative joint disease)    Right shoulder  . Hypertension   . Lumbago   . Peripheral neuropathy (HCC)     .pro HOSPITAL COURSE:   The patient is a 71 year old Caucasian male with past medical history significant for history of COPD, chronic respiratory failure, peripheral neuropathy, lumbar go, hypertension, who presented to the hospital with fall, altered mental status. EMS found patient to be hypoxic with O2 sats of 60, he was drowsy, lethargic, he was found by his friend. He was brought to emergency room for further evaluation and treatment. Initial chest x-ray revealed scarring but no edema or pneumonia. He was initiated on broad-spectrum antibiotic therapy, steroids, inhalation therapy, and his condition improved. The patient had carotid ultrasound done as evaluation of recurrent syncope episodes, which revealed significant atherosclerotic plaque at both carotid bifurcations,right  greater than left. Estimated right ICA stenosis is greater than 70%. Estimated left ICA stenosis is 50- 69%. Patient underwent CT angiogram of head and neck, which revealed bulky calcified plaque at the proximal right ICA to a degree that the lumen is not measurable. Implied flow limiting stenosis, ridge like structure at the left ICA bulb from ulceration or web causes 50% luminal narrowing, strongly dominant left vertebral artery with no flow limiting stenosis noted in the posterior or intracranial circulation, multiple asymmetric calcifications in right cerebral sulci, suspect remote calcified plaque embolus, small remote right frontal infarct. Patient was seen by vascular surgeon, who recommended to follow-up with his primary vascular surgeon in [place] as soon as possible, continue aspirin therapy for now. Patient was evaluated by physical therapist and recommended skilled nursing facility placement, where he is going to be discharged today. CD-ROM containing CT angiogram of head and neck will be given to patient to take to vascular surgeon follow-up.  Discussion by problem:  #1 acute Of chronic hypercapnic hypoxic respiratory failure-secondary to COPD exacerbation. Admitted to ICU for BiPAP. Currently on 1.5 L nasal cannula which is his chronic home O2 requirement.Patient's CO2 level was  increasing, respiratory therapist was asked to decrease oxygen flow to keep O2 sats at 90-94%. Continue nebs, steroid taper. The patient refused  BiPAP at bedtime. Patient had recent complicated respiratory course including intubation requiring tracheostomy in September 2017,  going to [facility], reverse tracheostomy in January 2018 and a repeat ICU admission requiring intubation and extubation at the time.The patient has clinically improved, he is stable to be discharged  to skilled nursing facility for  rehabilitation, he is to follow-up with Dr. Sung Amabile as outpatient.   #2 acute diastolic heart failure-pulmonary edema  on admission - ECHO in January 2018 with EF 60%, diastolic failure - the patient was on IV lasix during this admission, now on Demadex orally , no orthostatic hypotension   #3 Depression- zoloft, seroquel  #4 HTN- on metoprolol, Good control   #5 Chronic pain, resume pain meds upon discharge, watching mental and respiratory status closely   #6 Hyperkalemia, resolved on diuretics   #7. Pancytopenia, resolved  #8. Generalized weakness, physical Therapist recommended skilled nursing facility placement , discharging patient today  #9. Recurrent syncopal episodes, carotid ultrasound was remarkable for right ICA stenosis, nonvisualized right vertebral artery, CT angiogram of the neck and head revealed significant severe right ICA stenosis, patient was seen by vascular surgeon, recommended evaluation by primary vascular surgeon in [place] as soon as possible, continue aspirin therapy, echocardiogram in November 2017, unremarkable. The patient will likely need a cardiac evaluation prior to any vascular procedure.  DISCHARGE CONDITIONS:   Stable  CONSULTS OBTAINED:  Treatment Team:  Shane Crutch, MD Bertram Denver, MD  DRUG ALLERGIES:   Allergies  Allergen Reactions  . Ampicillin Swelling and Other (See Comments)    Unable to obtain enough information to answer additional questions about this medication.  Has patient had a PCN reaction causing immediate rash, facial/tongue/throat swelling, SOB or lightheadedness with hypotension: Yes Has patient had a PCN reaction causing severe rash involving mucus membranes or skin necrosis: No Has patient had a PCN reaction that required hospitalization No Has patient had a PCN reaction occurring within the last 10 years: Yes If all of the above answers are "NO", then may pr  . Nortriptyline Other (See Comments)    Sleep walking  . Brimonidine Other (See Comments)    Unknown reaction per MAR   . Cyclobenzaprine Other (See  Comments)    Hallucinations  . Tizanidine Other (See Comments)    Hallucinations  . Gabapentin Rash    DISCHARGE MEDICATIONS:   Current Discharge Medication List    START taking these medications   Details  aspirin EC 325 MG tablet Take 1 tablet (325 mg total) by mouth daily. Qty: 30 tablet, Refills: 0    predniSONE (STERAPRED UNI-PAK 21 TAB) 10 MG (21) TBPK tablet Please take 6 pills in the morning on the day 1 and 2, then taper by one pill every 2 days until finished, thank you Qty: 42 tablet, Refills: 0      CONTINUE these medications which have CHANGED   Details  clonazePAM (KLONOPIN) 0.5 MG tablet Take 1 tablet (0.5 mg total) by mouth 2 (two) times daily. Qty: 30 tablet, Refills: 0    oxyCODONE (OXY IR/ROXICODONE) 5 MG immediate release tablet Take 1 tablet (5 mg total) by mouth every 8 (eight) hours. Qty: 30 tablet, Refills: 0      CONTINUE these medications which have NOT CHANGED   Details  albuterol (ACCUNEB) 1.25 MG/3ML nebulizer solution Take 1 ampule by nebulization every 4 (four) hours as needed for wheezing.    docusate sodium (COLACE) 100 MG capsule Take 100 mg by mouth 2 (two) times daily.     folic acid (FOLVITE) 1 MG tablet Take 1 mg by mouth daily.     lisinopril (PRINIVIL,ZESTRIL) 20 MG tablet Take 1 tablet (20 mg total) by mouth 2 (two) times daily. Qty: 60 tablet, Refills: 6    metoCLOPramide (REGLAN) 5 MG tablet Take 2.5 mg by  mouth every 6 (six) hours.     metoprolol tartrate (LOPRESSOR) 25 MG tablet Take 1 tablet (25 mg total) by mouth 2 (two) times daily. Qty: 60 tablet, Refills: 6    QUEtiapine (SEROQUEL) 50 MG tablet Take 50 mg by mouth at bedtime.    sertraline (ZOLOFT) 50 MG tablet Take 75 mg by mouth daily.     tiotropium (SPIRIVA) 18 MCG inhalation capsule Place 1 capsule (18 mcg total) into inhaler and inhale daily. Qty: 30 capsule, Refills: 12    torsemide (DEMADEX) 20 MG tablet Take 20 mg by mouth daily.     budesonide  (PULMICORT) 0.5 MG/2ML nebulizer solution Take 0.5 mg by nebulization 2 (two) times daily.    Multiple Vitamin (MULTIVITAMIN WITH MINERALS) TABS tablet Take 1 tablet by mouth daily.    OXYGEN 4 L/min by Intratracheal route as needed (for shortness of breath).       STOP taking these medications     budesonide-formoterol (SYMBICORT) 160-4.5 MCG/ACT inhaler      ipratropium-albuterol (DUONEB) 0.5-2.5 (3) MG/3ML SOLN      Nutritional Supplements (FEEDING SUPPLEMENT, GLUCERNA 1.5 CAL,) LIQD          DISCHARGE INSTRUCTIONS:    The patient is to follow-up with primary care physician, primary vascular surgeon, pulmonologist as outpatient  If you experience worsening of your admission symptoms, develop shortness of breath, life threatening emergency, suicidal or homicidal thoughts you must seek medical attention immediately by calling 911 or calling your MD immediately  if symptoms less severe.  You Must read complete instructions/literature along with all the possible adverse reactions/side effects for all the Medicines you take and that have been prescribed to you. Take any new Medicines after you have completely understood and accept all the possible adverse reactions/side effects.   Please note  You were cared for by a hospitalist during your hospital stay. If you have any questions about your discharge medications or the care you received while you were in the hospital after you are discharged, you can call the unit and asked to speak with the hospitalist on call if the hospitalist that took care of you is not available. Once you are discharged, your primary care physician will handle any further medical issues. Please note that NO REFILLS for any discharge medications will be authorized once you are discharged, as it is imperative that you return to your primary care physician (or establish a relationship with a primary care physician if you do not have one) for your aftercare needs so  that they can reassess your need for medications and monitor your lab values.    Today   CHIEF COMPLAINT:   Chief Complaint  Patient presents with  . Altered Mental Status    HISTORY OF PRESENT ILLNESS:  Ahyan Kreeger  is a 71 y.o. male with a known history of COPD, chronic respiratory failure, peripheral neuropathy, lumbar go, hypertension, who presented to the hospital with fall, altered mental status. EMS found patient to be hypoxic with O2 sats of 60, he was drowsy, lethargic, he was found by his friend. He was brought to emergency room for further evaluation and treatment. Initial chest x-ray revealed scarring but no edema or pneumonia. He was initiated on broad-spectrum antibiotic therapy, steroids, inhalation therapy, and his condition improved. The patient had carotid ultrasound done as evaluation of recurrent syncope episodes, which revealed significant atherosclerotic plaque at both carotid bifurcations,right greater than left. Estimated right ICA stenosis is greater than 70%. Estimated left ICA  stenosis is 50- 69%. Patient underwent CT angiogram of head and neck, which revealed bulky calcified plaque at the proximal right ICA to a degree that the lumen is not measurable. Implied flow limiting stenosis, ridge like structure at the left ICA bulb from ulceration or web causes 50% luminal narrowing, strongly dominant left vertebral artery with no flow limiting stenosis noted in the posterior or intracranial circulation, multiple asymmetric calcifications in right cerebral sulci, suspect remote calcified plaque embolus, small remote right frontal infarct. Patient was seen by vascular surgeon, who recommended to follow-up with his primary vascular surgeon in [place] as soon as possible, continue aspirin therapy for now. Patient was evaluated by physical therapist and recommended skilled nursing facility placement, where he is going to be discharged today. CD-ROM containing CT angiogram of head  and neck will be given to patient to take to vascular surgeon follow-up.  Discussion by problem:  #1 acute Of chronic hypercapnic hypoxic respiratory failure-secondary to COPD exacerbation. Admitted to ICU for BiPAP. Currently on 1.5 L nasal cannula which is his chronic home O2 requirement.Patient's CO2 level was  increasing, respiratory therapist was asked to decrease oxygen flow to keep O2 sats at 90-94%. Continue nebs, steroid taper. The patient refused  BiPAP at bedtime. Patient had recent complicated respiratory course including intubation requiring tracheostomy in September 2017,  going to [facility], reverse tracheostomy in January 2018 and a repeat ICU admission requiring intubation and extubation at the time.The patient has clinically improved, he is stable to be discharged  to skilled nursing facility for rehabilitation, he is to follow-up with Dr. Sung Amabile as outpatient.   #2 acute diastolic heart failure-pulmonary edema on admission - ECHO in January 2018 with EF 60%, diastolic failure - the patient was on IV lasix during this admission, now on Demadex orally , no orthostatic hypotension   #3 Depression- zoloft, seroquel  #4 HTN- on metoprolol, Good control   #5 Chronic pain, resume pain meds upon discharge, watching mental and respiratory status closely   #6 Hyperkalemia, resolved on diuretics   #7. Pancytopenia, resolved  #8. Generalized weakness, physical Therapist recommended skilled nursing facility placement , discharging patient today  #9. Recurrent syncopal episodes, carotid ultrasound was remarkable for right ICA stenosis, nonvisualized right vertebral artery, CT angiogram of the neck and head revealed significant severe right ICA stenosis, patient was seen by vascular surgeon, recommended evaluation by primary vascular surgeon in [place] as soon as possible, continue aspirin therapy, echocardiogram in November 2017, unremarkable. The patient will likely need a cardiac  evaluation prior to any vascular procedure.   VITAL SIGNS:  Blood pressure (!) 142/77, pulse 61, temperature 97.7 F (36.5 C), temperature source Oral, resp. rate 18, height 5\' 8"  (1.727 m), weight 90.9 kg (200 lb 6 oz), SpO2 100 %.  I/O:    Intake/Output Summary (Last 24 hours) at 08/23/16 1626 Last data filed at 08/23/16 0958  Gross per 24 hour  Intake           2393.5 ml  Output             1750 ml  Net            643.5 ml    PHYSICAL EXAMINATION:  GENERAL:  71 y.o.-year-old patient lying in the bed with no acute distress.  EYES: Pupils equal, round, reactive to light and accommodation. No scleral icterus. Extraocular muscles intact.  HEENT: Head atraumatic, normocephalic. Oropharynx and nasopharynx clear.  NECK:  Supple, no jugular venous distention. No thyroid enlargement,  no tenderness.  LUNGS: Normal breath sounds bilaterally, no wheezing, rales,rhonchi or crepitation. No use of accessory muscles of respiration.  CARDIOVASCULAR: S1, S2 normal. No murmurs, rubs, or gallops.  ABDOMEN: Soft, non-tender, non-distended. Bowel sounds present. No organomegaly or mass.  EXTREMITIES: No pedal edema, cyanosis, or clubbing.  NEUROLOGIC: Cranial nerves II through XII are intact. Muscle strength 5/5 in all extremities. Sensation intact. Gait not checked.  PSYCHIATRIC: The patient is alert and oriented x 3.  SKIN: No obvious rash, lesion, or ulcer.   DATA REVIEW:   CBC  Recent Labs Lab 08/22/16 0442  WBC 7.1  HGB 10.1*  HCT 30.8*  PLT 167    Chemistries   Recent Labs Lab 08/19/16 1930 08/20/16 0748  08/23/16 0416  NA 141 139  < > 138  K 4.9 5.6*  < > 4.8  CL 95* 96*  < > 92*  CO2 37* 35*  < > 40*  GLUCOSE 99 103*  < > 124*  BUN 39* 34*  < > 45*  CREATININE 1.33* 1.03  < > 1.01  CALCIUM 9.0 9.0  < > 8.9  MG  --  2.1  --   --   AST 19  --   --   --   ALT 12*  --   --   --   ALKPHOS 46  --   --   --   BILITOT 1.2  --   --   --   < > = values in this interval not  displayed.  Cardiac Enzymes  Recent Labs Lab 08/19/16 1930  TROPONINI <0.03    Microbiology Results  Results for orders placed or performed during the hospital encounter of 08/19/16  MRSA PCR Screening     Status: None   Collection Time: 08/20/16  1:49 AM  Result Value Ref Range Status   MRSA by PCR NEGATIVE NEGATIVE Final    Comment:        The GeneXpert MRSA Assay (FDA approved for NASAL specimens only), is one component of a comprehensive MRSA colonization surveillance program. It is not intended to diagnose MRSA infection nor to guide or monitor treatment for MRSA infections.     RADIOLOGY:  Ct Angio Head W Or Wo Contrast  Result Date: 08/22/2016 CLINICAL DATA:  Syncope.  Abnormal Doppler. EXAM: CT ANGIOGRAPHY HEAD AND NECK TECHNIQUE: Multidetector CT imaging of the head and neck was performed using the standard protocol during bolus administration of intravenous contrast. Multiplanar CT image reconstructions and MIPs were obtained to evaluate the vascular anatomy. Carotid stenosis measurements (when applicable) are obtained utilizing NASCET criteria, using the distal internal carotid diameter as the denominator. CONTRAST:  Study 5 cc Isovue 370 intravenous COMPARISON:  07/16/2016 head CT FINDINGS: CT HEAD FINDINGS Brain: No evidence of acute infarction, hemorrhage, hydrocephalus, extra-axial collection or mass lesion/mass effect. 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 Sulcal calcifications along the right cerebral convexity, unilateral. Vascular: Atherosclerotic calcification. Skull: Negative Sinuses: Negative Orbits: Negative Review of the MIP images confirms the above findings CTA NECK FINDINGS Aortic arch: No acute finding. Atherosclerosis. Three vessel branching Right carotid system: Mild calcified atherosclerotic plaque at the brachiocephalic bifurcation. Bulky calcified plaque with irregular appearance at the ICA bulb. The distal portion  of the plaque, and associated lumen, is obscured by streak artifact from dental amalgam. Quantification of the related stenosis is limited due to calcified plaque blooming and irregular shape, but narrowing is presumably advanced - correlating with Doppler findings.  The plaque is irregular in shape. No signs of dissection Left carotid system: Atheromatous wall thickening of the common carotid and at the bifurcation. There is an ulceration or web at the bulb, best seen on sagittal reformats. Stenosis measures up to 50% at the level of the luminal ridge. Vertebral arteries: No flow limiting stenosis in the proximal subclavian arteries. Strong left vertebral artery dominance. Calcified plaque at the V1 and V2 segments on the left without flow limiting stenosis. Duplicated V3 segment on the left. Skeleton: Atlantooccipital non segmentation. Posterior fusion from the occipital skull to T2. Diffuse posterior decompression of the cervical spine. No acute osseous finding. Other neck: Marked fatty atrophy of rotator cuff muscles. Changes of prior tracheostomy. Upper chest: Paraseptal emphysema. Paramediastinal right upper lobe scarring. The visible main pulmonary artery appears dilated. Review of the MIP images confirms the above findings CTA HEAD FINDINGS Anterior circulation: Diffuse calcified plaque on the carotid siphons. No flow limiting stenosis or branch occlusion seen in the anterior circulation. Negative for aneurysm. Posterior circulation: Strongly dominant left vertebral artery, the right vertebral artery essentially ends in PICA. Fenestrated left V3 segment. Mild left V4 segment atheromatous narrowing. No major branch occlusion. Basilar and posterior cerebral arteries are diffusely patent. Venous sinuses: Patent Anatomic variants: None unusual Delayed phase: Negative for abnormal intracranial enhancement. Review of the MIP images confirms the above findings IMPRESSION: 1. Bulky calcified plaque at the proximal  right ICA to a degree that the lumen is not measurable. Implied flow limiting stenosis. 2. Ridge like structure at the left ICA bulb from ulceration or web causes 50% luminal narrowing. 3. Strongly dominant left vertebral artery. No flow limiting stenosis noted in the posterior or intracranial circulation. 4. Multiple asymmetric calcifications in right cerebral sulci, suspect remote calcified plaque embolus. 5. Small remote right frontal infarct. Electronically Signed   By: Marnee Spring M.D.   On: 08/22/2016 13:25   Ct Angio Neck W Or Wo Contrast  Result Date: 08/22/2016 CLINICAL DATA:  Syncope.  Abnormal Doppler. EXAM: CT ANGIOGRAPHY HEAD AND NECK TECHNIQUE: Multidetector CT imaging of the head and neck was performed using the standard protocol during bolus administration of intravenous contrast. Multiplanar CT image reconstructions and MIPs were obtained to evaluate the vascular anatomy. Carotid stenosis measurements (when applicable) are obtained utilizing NASCET criteria, using the distal internal carotid diameter as the denominator. CONTRAST:  Study 5 cc Isovue 370 intravenous COMPARISON:  07/16/2016 head CT FINDINGS: CT HEAD FINDINGS Brain: No evidence of acute infarction, hemorrhage, hydrocephalus, extra-axial collection or mass lesion/mass effect. 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 Sulcal calcifications along the right cerebral convexity, unilateral. Vascular: Atherosclerotic calcification. Skull: Negative Sinuses: Negative Orbits: Negative Review of the MIP images confirms the above findings CTA NECK FINDINGS Aortic arch: No acute finding. Atherosclerosis. Three vessel branching Right carotid system: Mild calcified atherosclerotic plaque at the brachiocephalic bifurcation. Bulky calcified plaque with irregular appearance at the ICA bulb. The distal portion of the plaque, and associated lumen, is obscured by streak artifact from dental amalgam. Quantification of  the related stenosis is limited due to calcified plaque blooming and irregular shape, but narrowing is presumably advanced - correlating with Doppler findings. The plaque is irregular in shape. No signs of dissection Left carotid system: Atheromatous wall thickening of the common carotid and at the bifurcation. There is an ulceration or web at the bulb, best seen on sagittal reformats. Stenosis measures up to 50% at the level of the luminal ridge. Vertebral arteries:  No flow limiting stenosis in the proximal subclavian arteries. Strong left vertebral artery dominance. Calcified plaque at the V1 and V2 segments on the left without flow limiting stenosis. Duplicated V3 segment on the left. Skeleton: Atlantooccipital non segmentation. Posterior fusion from the occipital skull to T2. Diffuse posterior decompression of the cervical spine. No acute osseous finding. Other neck: Marked fatty atrophy of rotator cuff muscles. Changes of prior tracheostomy. Upper chest: Paraseptal emphysema. Paramediastinal right upper lobe scarring. The visible main pulmonary artery appears dilated. Review of the MIP images confirms the above findings CTA HEAD FINDINGS Anterior circulation: Diffuse calcified plaque on the carotid siphons. No flow limiting stenosis or branch occlusion seen in the anterior circulation. Negative for aneurysm. Posterior circulation: Strongly dominant left vertebral artery, the right vertebral artery essentially ends in PICA. Fenestrated left V3 segment. Mild left V4 segment atheromatous narrowing. No major branch occlusion. Basilar and posterior cerebral arteries are diffusely patent. Venous sinuses: Patent Anatomic variants: None unusual Delayed phase: Negative for abnormal intracranial enhancement. Review of the MIP images confirms the above findings IMPRESSION: 1. Bulky calcified plaque at the proximal right ICA to a degree that the lumen is not measurable. Implied flow limiting stenosis. 2. Ridge like  structure at the left ICA bulb from ulceration or web causes 50% luminal narrowing. 3. Strongly dominant left vertebral artery. No flow limiting stenosis noted in the posterior or intracranial circulation. 4. Multiple asymmetric calcifications in right cerebral sulci, suspect remote calcified plaque embolus. 5. Small remote right frontal infarct. Electronically Signed   By: Marnee Spring M.D.   On: 08/22/2016 13:25   US Carotid Bilateral  Result Date: 08/22/2016 CLINICAL DATA:  Syncope, hypertension, right eye visual disturbance. EXAM: BILATERAL CAROTID DUPLEX ULTRASOUND TECHNIQUE: Wallace Cullens scale imaging, color Doppler and duplex ultrasound were performed of bilateral carotid and vertebral arteries in the neck. COMPARISON:  Report from a prior study at [facility] at [facility] on 09/22/2015 FINDINGS: Criteria: Quantification of carotid stenosis is based on velocity parameters that correlate the residual internal carotid diameter with NASCET-based stenosis levels, using the diameter of the distal internal carotid lumen as the denominator for stenosis measurement. The following velocity measurements were obtained: RIGHT ICA:  316/78 cm/sec CCA:  90/14 cm/sec SYSTOLIC ICA/CCA RATIO:  3.5 DIASTOLIC ICA/CCA RATIO:  5.6 ECA:   cm/sec LEFT ICA:  128/20 cm/sec CCA:  109/22 cm/sec SYSTOLIC ICA/CCA RATIO:  1.2 DIASTOLIC ICA/CCA RATIO:  0.9 ECA:  288 cm/sec RIGHT CAROTID ARTERY: Common and internal carotid arteries are tortuous. There is a significant amount of calcified plaque at the level of the distal bulb and proximal ICA. Based on velocity elevation, estimated ICA stenosis is estimated to be greater than 70%. RIGHT VERTEBRAL ARTERY: The right vertebral artery was not visualized. LEFT CAROTID ARTERY: Moderate amount of predominately calcified plaque at the level of the carotid bulb and proximal left ICA. Based on velocities, estimated left ICA stenosis is 50- 69%. LEFT VERTEBRAL ARTERY: Antegrade flow with normal  waveform and velocity. IMPRESSION: Significant atherosclerotic plaque at both carotid bifurcations, right greater than left. Estimated right ICA stenosis is greater than 70%. Estimated left ICA stenosis is 50- 69%. Electronically Signed   By: Irish Lack M.D.   On: 08/22/2016 13:51    EKG:   Orders placed or performed during the hospital encounter of 08/19/16  . ED EKG  . ED EKG  . EKG 12-Lead  . EKG 12-Lead  . EKG 12-Lead      Management plans discussed with  the patient, family and they are in agreement.  CODE STATUS:     Code Status Orders        Start     Ordered   08/20/16 0206  Full code  Continuous     08/20/16 0205    Code Status History    Date Active Date Inactive Code Status Order ID Comments User Context   07/16/2016  1:12 PM 07/27/2016  8:18 PM Full Code [id number]  Erin Fulling, MD ED   03/09/2016  5:20 PM 03/15/2016 10:06 PM Full Code [id number]  Heide Spark Inpatient   03/03/2016  6:09 PM 03/09/2016  4:04 PM Full Code [id number]  Erin Fulling, MD ED   02/27/2016 10:41 PM 02/29/2016  7:53 PM Full Code [id number]  Oralia Manis, MD Inpatient   07/22/2015  7:30 PM 07/26/2015 12:16 AM Full Code [id number]  Adrian Saran, MD Inpatient   02/11/2015 10:28 AM 02/11/2015  6:15 PM Full Code [id number]  Nada Libman, MD Inpatient   08/04/2014  8:13 PM 08/13/2014  2:53 PM Full Code [id number]  Lynden Oxford, MD Inpatient    Advance Directive Documentation   Flowsheet Row Most Recent Value  Type of Advance Directive  Living will  Pre-existing out of facility DNR order (yellow form or pink MOST form)  No data  "MOST" Form in Place?  No data      TOTAL TIME TAKING CARE OF THIS PATIENT: 40 minutes.    Katharina Caper M.D on 08/23/2016 at 4:26 PM  Between 7am to 6pm - Pager - [phone]  After 6pm go to [email or web address] - password [email or web address] [facility]  [facility]  Office  [phone]  CC: Primary care physician; Dorothey Baseman, MD

## 2016-08-23 NOTE — Progress Notes (Signed)
Pt still with no BM, MD ordered Enema.

## 2016-08-23 NOTE — Progress Notes (Signed)
PT was able to have a BM.

## 2016-08-23 NOTE — [place] FL2 (Signed)
[place] MEDICAID FL2 LEVEL OF CARE SCREENING TOOL     IDENTIFICATION  Patient Name: Chad Huffman Birthdate: 1945-08-16 Sex: male Admission Date (Current Location): 08/19/2016  [place] and [place] Number:  Chiropodist and Address:  [facility], [address] [place], [place] [address]      Provider Number: [id number]  Attending Physician Name and Address:  Katharina Caper, MD  Relative Name and Phone Number:       Current Level of Care: Hospital Recommended Level of Care: Skilled Nursing Facility Prior Approval Number:    Date Approved/Denied:   PASRR Number: [id number]  Discharge Plan: SNF    Current Diagnoses: Patient Active Problem List   Diagnosis Date Noted  . Carotid artery stenosis 08/23/2016  . Pancytopenia (HCC) 08/23/2016  . Hyperkalemia 08/23/2016  . Acute on chronic diastolic CHF (congestive heart failure) (HCC) 07/27/2016  . Leukocytosis 07/27/2016  . Thrombocytopenia (HCC) 07/27/2016  . Anemia 07/27/2016  . Acute on chronic respiratory failure with hypoxia and hypercapnia (HCC) 07/16/2016  . 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 Dysphagia, pharyngoesophageal phase 05/18/2016  . GERD without esophagitis 05/09/2016  . Bilateral lower extremity edema 05/09/2016  . Slow transit constipation 05/09/2016  . Depression with anxiety 05/09/2016  . Respiratory failure with hypoxia and hypercapnia (HCC) 07/22/2015  . Cervical vertebral fusion   . Depression   . Chronic neck and back pain   . Acute on chronic respiratory failure with hypercapnia (HCC)   . COPD exacerbation (HCC)   . HCAP (healthcare-associated pneumonia)   . Congestive dilated cardiomyopathy (HCC)   . Syncope and collapse   . Arthritis of right hip 08/04/2014  . Essential hypertension 08/04/2014  . Hypothyroidism 08/04/2014  . Syncope 08/04/2014  . Fall 08/04/2014    Orientation RESPIRATION BLADDER  Height & Weight     Self, Time, Situation, Place  O2 (2L) Continent Weight: 200 lb 6 oz (90.9 kg) Height:  5\' 8"  (172.7 cm)  BEHAVIORAL SYMPTOMS/MOOD NEUROLOGICAL BOWEL NUTRITION STATUS      Continent Diet (Regular Diet, Thin Liquids)  AMBULATORY STATUS COMMUNICATION OF NEEDS Skin   Limited Assist Verbally Normal                       Personal Care Assistance Level of Assistance  Bathing, Feeding, Dressing Bathing Assistance: Limited assistance Feeding assistance: Independent Dressing Assistance: Limited assistance     Functional Limitations Info  Sight, Hearing, Speech Sight Info: Adequate Hearing Info: Adequate Speech Info: Adequate    SPECIAL CARE FACTORS FREQUENCY  PT (By licensed PT)     PT Frequency: 5              Contractures Contractures Info: Not present    Additional Factors Info  Code Status, Allergies, Psychotropic Code Status Info: Full Code Allergies Info: Ampicillin, Nortriptyline, Brimonidine, Cyclobenzaprine, Tizanidine, Gabapentin Psychotropic Info: Medications:  Seroquel, Zoloft         Current Medications (08/23/2016):  This is the current hospital active medication list Current Facility-Administered Medications  Medication Dose Route Frequency Provider Last Rate Last Dose  . acetaminophen (TYLENOL) tablet 650 mg  650 mg Oral Q6H PRN Alexis Hugelmeyer, DO       Or  . acetaminophen (TYLENOL) suppository 650 mg  650 mg Rectal Q6H PRN Alexis Hugelmeyer, DO      . albuterol (PROVENTIL) (2.5 MG/3ML) 0.083% nebulizer solution 1.5 mL  1.5 mL Nebulization Q4H PRN  Alexis Hugelmeyer, DO      . baclofen (LIORESAL) tablet 20 mg  20 mg Oral TID Shaune Pollack, MD   20 mg at 08/23/16 0850  . bisacodyl (DULCOLAX) EC tablet 5 mg  5 mg Oral Daily PRN Alexis Hugelmeyer, DO   5 mg at 08/23/16 0749  . budesonide (PULMICORT) nebulizer solution 0.5 mg  0.5 mg Nebulization BID Alexis Hugelmeyer, DO   0.5 mg at 08/23/16 1610  . chlorhexidine (PERIDEX) 0.12 %  solution 15 mL  15 mL Mouth Rinse BID Alexis Hugelmeyer, DO   15 mL at 08/23/16 0851  . clonazePAM (KLONOPIN) tablet 0.5 mg  0.5 mg Per Tube BID PRN Ihor Austin, MD      . dextromethorphan (DELSYM) 30 MG/5ML liquid 30 mg  30 mg Oral BID Enid Baas, MD   30 mg at 08/23/16 0859   And  . guaiFENesin (MUCINEX) 12 hr tablet 600 mg  600 mg Oral BID Enid Baas, MD   600 mg at 08/23/16 0850  . docusate sodium (COLACE) capsule 100 mg  100 mg Oral BID Alexis Hugelmeyer, DO   100 mg at 08/22/16 2038  . enoxaparin (LOVENOX) injection 40 mg  40 mg Subcutaneous QHS Alexis Hugelmeyer, DO   40 mg at 08/22/16 2037  . folic acid (FOLVITE) tablet 1 mg  1 mg Per Tube Daily Alexis Hugelmeyer, DO   1 mg at 08/23/16 0850  . ipratropium-albuterol (DUONEB) 0.5-2.5 (3) MG/3ML nebulizer solution 3 mL  3 mL Nebulization TID PRN Alexis Hugelmeyer, DO   3 mL at 08/22/16 0721  . magnesium citrate solution 1 Bottle  1 Bottle Oral Once PRN Alexis Hugelmeyer, DO      . MEDLINE mouth rinse  15 mL Mouth Rinse q12n4p Alexis Hugelmeyer, DO   15 mL at 08/22/16 2039  . methylPREDNISolone sodium succinate (SOLU-MEDROL) 125 mg/2 mL injection 60 mg  60 mg Intravenous Q8H Shane Crutch, MD   60 mg at 08/23/16 1422  . metoCLOPramide (REGLAN) tablet 2.5 mg  2.5 mg Per Tube Q6H PRN Alexis Hugelmeyer, DO      . metoprolol tartrate (LOPRESSOR) tablet 25 mg  25 mg Oral BID Alexis Hugelmeyer, DO   25 mg at 08/23/16 0850  . mometasone-formoterol (DULERA) 200-5 MCG/ACT inhaler 2 puff  2 puff Inhalation BID Alexis Hugelmeyer, DO   2 puff at 08/23/16 0743  . multivitamin with minerals tablet 1 tablet  1 tablet Oral Daily Alexis Hugelmeyer, DO   1 tablet at 08/23/16 0850  . ondansetron (ZOFRAN) tablet 4 mg  4 mg Oral Q6H PRN Alexis Hugelmeyer, DO       Or  . ondansetron (ZOFRAN) injection 4 mg  4 mg Intravenous Q6H PRN Alexis Hugelmeyer, DO      . oxyCODONE (Oxy IR/ROXICODONE) immediate release tablet 5 mg  5 mg Per Tube Q8H PRN  Alexis Hugelmeyer, DO   5 mg at 08/21/16 1024  . QUEtiapine (SEROQUEL) tablet 50 mg  50 mg Oral QHS Alexis Hugelmeyer, DO   50 mg at 08/22/16 2038  . senna-docusate (Senokot-S) tablet 1 tablet  1 tablet Oral QHS PRN Alexis Hugelmeyer, DO      . sertraline (ZOLOFT) tablet 75 mg  75 mg Per Tube Daily Alexis Hugelmeyer, DO   75 mg at 08/23/16 0851  . sodium chloride flush (NS) 0.9 % injection 3 mL  3 mL Intravenous Q12H Alexis Hugelmeyer, DO   3 mL at 08/23/16 0857  . tiotropium (SPIRIVA) inhalation capsule 18 mcg  18 mcg Inhalation Daily Alexis Hugelmeyer, DO   18 mcg at 08/23/16 0859  . torsemide (DEMADEX) tablet 20 mg  20 mg Oral Daily Enid Baasadhika Kalisetti, MD   20 mg at 08/23/16 [id number]     Discharge Medications: Please see discharge summary for a list of discharge medications.  Relevant Imaging Results:  Relevant Lab Results:   Additional Information SSN:  [id number]  Dede QuerySarah Linetta Regner, LCSW

## 2016-08-23 NOTE — Progress Notes (Signed)
Physical Therapy Treatment Patient Details Name: Chad Huffman MRN: [id number] DOB: 09-Oct-1945 Today's Date: 08/23/2016    History of Present Illness presented to ER secondary to AMS, lethargy and hypoxia; admitted with acute/chronic hypoxic, hypercapnic respiratory failure.  Requiring BiPAP upon admission, now transitioned to 4L via [place].  Of note, patient with multiple recent hospitalizations with transition to STR at discharge (with subsequent return to hospital after 1-2 weeks home)    PT Comments    Pt with improved bed mobility this session.  Was able to get to EOB without assist with increased time and use of rail.  Sitting EOB with min guard without LOB.  He was able to stand with min assist and ambulate around unit with walker and close min assist +1 with +1 assist for wheelchair follow and management of oxygen.    Pt with overall improved mobility this session but remains a significant fall risk.  Gait is generally unsteady and is not safe to ambulate at this time without assist.  LLE hyperextends during gait with decreased step height and length bilaterally.  He leans to the left occasionally and c/o UE weakness during gait due to heavy reliance on walker for support and balance.  Pt does live alone and has reported multiple falls since discharge from rehab.  Despite his overall improvement, SNF remains and appropriate option for pt at this time.  He does not have a Life Line system at home and pt would benefit from one upon discharge from rehab if he chooses to return home.    At this time pt is agreeable to SNF and it was discussed with Steward DroneBrenda from Care Management.   Follow Up Recommendations  SNF     Equipment Recommendations       Recommendations for Other Services       Precautions / Restrictions Precautions Precautions: Fall Restrictions Weight Bearing Restrictions: No    Mobility  Bed Mobility Overal bed mobility: Modified Independent Bed Mobility: Supine to Sit     Supine to sit: Modified independent (Device/Increase time)        Transfers Overall transfer level: Needs assistance Equipment used: Rolling walker (2 wheeled) Transfers: Sit to/from Stand Sit to Stand: Min assist;+2 physical assistance         General transfer comment: cuing for hand placement; assist for lift off and stabilization  Ambulation/Gait Ambulation/Gait assistance: Min assist;Mod assist Ambulation Distance (Feet): 160 Feet Assistive device: Rolling walker (2 wheeled)     Gait velocity interpretation: Below normal speed for age/gender General Gait Details: Broad BOS, decreased step length and height, hyperextension of L knee during gait.  leans to left occasionally.  Unsafe to ambulate without assist.   Stairs            Wheelchair Mobility    Modified Rankin (Stroke Patients Only)       Balance Overall balance assessment: Needs assistance Sitting-balance support: No upper extremity supported;Feet supported Sitting balance-Leahy Scale: Good     Standing balance support: Bilateral upper extremity supported Standing balance-Leahy Scale: Poor                      Cognition Arousal/Alertness: Awake/alert Behavior During Therapy: WFL for tasks assessed/performed Overall Cognitive Status: Within Functional Limits for tasks assessed                      Exercises      General Comments        Pertinent  Vitals/Pain Pain Assessment: 0-10 Pain Score: 2  Pain Location: back Pain Descriptors / Indicators: Sore Pain Intervention(s): Limited activity within patient's tolerance    Home Living                      Prior Function            PT Goals (current goals can now be found in the care plan section) Progress towards PT goals: Progressing toward goals    Frequency    Min 2X/week      PT Plan Current plan remains appropriate    Co-evaluation             End of Session Equipment Utilized During  Treatment: Gait belt;Oxygen Activity Tolerance: Patient tolerated treatment well Patient left: in chair;with call bell/phone within reach;with chair alarm set Nurse Communication: Mobility status       Time: 1610-9604 PT Time Calculation (min) (ACUTE ONLY): 24 min  Charges:  $Gait Training: 8-22 mins $Therapeutic Activity: 8-22 mins                    G Codes:       Danielle Dess, PTA 08/23/16, 9:36 AM

## 2016-08-23 NOTE — Clinical Social Work Placement (Signed)
   CLINICAL SOCIAL WORK PLACEMENT  NOTE  Date:  08/23/2016  Patient Details  Name: Illene LabradorDanny L Kneale MRN: [id number] Date of Birth: 10-Apr-1946  Clinical Social Work is seeking post-discharge placement for this patient at the Skilled  Nursing Facility level of care (*CSW will initial, date and re-position this form in  chart as items are completed):  Yes   Patient/family provided with [facility] Clinical Social Work Department's list of facilities offering this level of care within the geographic area requested by the patient (or if unable, by the patient's family).  Yes   Patient/family informed of their freedom to choose among providers that offer the needed level of care, that participate in Medicare, Medicaid or managed care program needed by the patient, have an available bed and are willing to accept the patient.  Yes   Patient/family informed of [facility]'s ownership interest in [facility] and [facility], as well as of the fact that they are under no obligation to receive care at these facilities.  PASRR submitted to EDS on       PASRR number received on       Existing PASRR number confirmed on 08/23/16     FL2 transmitted to all facilities in geographic area requested by pt/family on 08/23/16     FL2 transmitted to all facilities within larger geographic area on       Patient informed that his/her managed care company has contracts with or will negotiate with certain facilities, including the following:        Yes   Patient/family informed of bed offers received.  Patient [facility]     Physician recommends and patient chooses bed at      [facility] on 08/23/16[facility]  [facility]     Patient family notified on 08/23/16 of transfer.  Name of family member notified:  Pt stated that he will call his brother.      PHYSICIAN       Additional  Comment:    _______________________________________________ Dede QuerySarah Shaneil Yazdi, LCSW 08/23/2016, 2:40 PM

## 2016-08-23 NOTE — Care Management Important Message (Signed)
Important Message  Patient Details  Name: Chad LabradorDanny L Huffman MRN: [id number] Date of Birth: 05-07-1946   Medicare Important Message Given:  Yes    Gwenette GreetBrenda S Dorsel Flinn, RN 08/23/2016, 9:28 AM

## 2016-08-23 NOTE — Progress Notes (Signed)
Report called to [facility]. EMS called for transport.

## 2016-08-24 ENCOUNTER — Telehealth: Payer: Self-pay

## 2016-08-24 NOTE — Telephone Encounter (Signed)
Rec'd call from pt. with request for an appt. With Dr. Myra GianottiBrabham, ASAP.  Reported [facility], and was advised to schedule an appt. with Dr. Myra GianottiBrabham, due to complete blockage of the carotid artery.  Stated he has fallen about 5 times over the past 4-5 weeks; stated he passed out for a brief time prior to the falls.  Denied any  extremity weakness,  speech problems,  or other neurological symptoms.  Discussed with Dr. Arbie CookeyEarly, in office.  Recommended to schedule appt. With Dr. Myra GianottiBrabham on 08/30/16.

## 2016-08-24 NOTE — Telephone Encounter (Signed)
Pt on for 3/12 12:15, LVM at facility for appt will call again 3/7

## 2016-08-24 NOTE — Telephone Encounter (Signed)
[facility] date be [facility] to [place] for this pt to be seen? I have no appts otherwise

## 2016-08-26 ENCOUNTER — Encounter: Payer: Self-pay | Admitting: Surgery

## 2016-08-30 ENCOUNTER — Encounter: Payer: Self-pay | Admitting: Surgery

## 2016-08-30 ENCOUNTER — Ambulatory Visit (INDEPENDENT_AMBULATORY_CARE_PROVIDER_SITE_OTHER): Payer: Medicare Other | Admitting: Surgery

## 2016-08-30 VITALS — BP 99/65 | HR 181 | Temp 97.5°F | Resp 20 | Ht 68.0 in | Wt 209.0 lb

## 2016-08-30 DIAGNOSIS — I6523 Occlusion and stenosis of bilateral carotid arteries: Secondary | ICD-10-CM | POA: Diagnosis not present

## 2016-08-30 MED ORDER — CLOPIDOGREL BISULFATE 75 MG PO TABS: 75.0000 mg | ORAL_TABLET | Freq: Every day | ORAL | 11 refills | Status: DC

## 2016-08-30 NOTE — Progress Notes (Signed)
[facility] [place]  Patient name: Chad Huffman MRN: [id number] DOB: 1946-03-15 Sex: male   REASON FOR VISIT:    F/u carotid stenosis  HISOTRY OF PRESENT ILLNESS:    Chad Huffman is a 71 y.o. male who comes in today for follow-up.  He is status post endovascular repair of an abdominal aortic aneurysm on 10/11/2007.  This has been stable.  He also has a history of carotid occlusive disease.  By ultrasound he had progression to greater than 80% on the right and underwent angiography in 2016 where approximate 70% stenosis was identified.  Because of the tortuosity, no intervention was performed.  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  The patient has a spine and neck fusion.  He was recently admitted to the hospital with acute on chronic respiratory failure with altered mental status.  He was found by EMS to be hypoxic with O2 sats in the 60s.  He is very drowsy lethargic.  During his workup he underwent carotid Doppler studies which showed greater than 70% stenosis in the right 50-60 in the left.  CT angiogram showed bulky calcified plaque on the right 50% stenosis on the left patient is on aspirin as single agent antiplatelet therapy.  He has had multiple falls recently.  PAST MEDICAL HISTORY:   Past Medical History:  Diagnosis Date  . Antalgic gait   . Carotid artery occlusion   . Chronic neck and back pain   . Complication of anesthesia    pt has had cervical fusion-limited neck flextion  . COPD (chronic obstructive pulmonary disease) (HCC)   . Depression   . DJD (degenerative joint disease)    Right shoulder  . Hypertension   . Lumbago   . Peripheral neuropathy (HCC)      FAMILY HISTORY:   Family History  Problem Relation Age of Onset  . Stomach cancer Mother   . Hypertension Brother   . Heart attack Father     SOCIAL HISTORY:   Social  History  Substance Use Topics  . Smoking status: Former Smoker    Quit date: 08/30/2006  . Smokeless tobacco: Never Used  . Alcohol use 1.2 oz/week    2 Cans of beer per week     Comment: occasional use     ALLERGIES:   Allergies  Allergen Reactions  . Ampicillin Swelling and Other (See Comments)    Unable to obtain enough information to answer additional questions about this medication.  Has patient had a PCN reaction causing immediate rash, facial/tongue/throat swelling, SOB or lightheadedness with hypotension: Yes Has patient had a PCN reaction causing severe rash involving mucus membranes or skin necrosis: No Has patient had a PCN reaction that required hospitalization No Has patient had a PCN reaction occurring within the last 10 years: Yes If all of the above answers are "NO", then may pr  . Nortriptyline Other (See Comments)    Sleep walking  . Brimonidine Other (See Comments)    Unknown reaction per MAR   . Cyclobenzaprine Other (See Comments)    Hallucinations  . Tizanidine Other (See Comments)    Hallucinations  . Gabapentin Rash     CURRENT MEDICATIONS:   Current Outpatient Prescriptions  Medication Sig Dispense Refill  . albuterol (ACCUNEB) 1.25 MG/3ML nebulizer solution Take 1 ampule by nebulization every 4 (four) hours as needed for wheezing.    Marland Kitchen aspirin EC 325  MG tablet Take 1 tablet (325 mg total) by mouth daily. 30 tablet 0  . budesonide (PULMICORT) 0.5 MG/2ML nebulizer solution Take 0.5 mg by nebulization 2 (two) times daily.    . clonazePAM (KLONOPIN) 0.5 MG tablet Take 1 tablet (0.5 mg total) by mouth 2 (two) times daily. 30 tablet 0  . docusate sodium (COLACE) 100 MG capsule Take 100 mg by mouth 2 (two) times daily.     . folic acid (FOLVITE) 1 MG tablet Take 1 mg by mouth daily.     Marland Kitchen. lisinopril (PRINIVIL,ZESTRIL) 20 MG tablet Take 1 tablet (20 mg total) by mouth 2 (two) times daily. 60 tablet 6  . metoCLOPramide (REGLAN) 5 MG tablet Take 2.5 mg by  mouth every 6 (six) hours.     . metoprolol tartrate (LOPRESSOR) 25 MG tablet Take 1 tablet (25 mg total) by mouth 2 (two) times daily. 60 tablet 6  . Multiple Vitamin (MULTIVITAMIN WITH MINERALS) TABS tablet Take 1 tablet by mouth daily.    Marland Kitchen. oxyCODONE (OXY IR/ROXICODONE) 5 MG immediate release tablet Take 1 tablet (5 mg total) by mouth every 8 (eight) hours. 30 tablet 0  . OXYGEN 4 L/min by Intratracheal route as needed (for shortness of breath).     . predniSONE (STERAPRED UNI-PAK 21 TAB) 10 MG (21) TBPK tablet Please take 6 pills in the morning on the day 1 and 2, then taper by one pill every 2 days until finished, thank you 42 tablet 0  . QUEtiapine (SEROQUEL) 50 MG tablet Take 50 mg by mouth at bedtime.    . sertraline (ZOLOFT) 50 MG tablet Take 75 mg by mouth daily.     Marland Kitchen. tiotropium (SPIRIVA) 18 MCG inhalation capsule Place 1 capsule (18 mcg total) into inhaler and inhale daily. 30 capsule 12  . torsemide (DEMADEX) 20 MG tablet Take 20 mg by mouth daily.      No current facility-administered medications for this visit.     REVIEW OF SYSTEMS:   [X]  denotes positive finding, [ ]  denotes negative finding Cardiac  Comments:  Chest pain or chest pressure:    Shortness of breath upon exertion: x   Short of breath when lying flat: x   Irregular heart rhythm:        Vascular    Pain in calf, thigh, or hip brought on by ambulation:    Pain in feet at night that wakes you up from your sleep:     Blood clot in your veins:    Leg swelling:         Pulmonary    Oxygen at home:    Productive cough:     Wheezing:         Neurologic    Sudden weakness in arms or legs:     Sudden numbness in arms or legs:  x   Sudden onset of difficulty speaking or slurred speech:    Temporary loss of vision in one eye:  x   Problems with dizziness:         Gastrointestinal    Blood in stool:     Vomited blood:         Genitourinary    Burning when urinating:     Blood in urine:          Psychiatric    Major depression:         Hematologic    Bleeding problems:    Problems with blood clotting too easily:  Skin    Rashes or ulcers:        Constitutional    Fever or chills:      PHYSICAL EXAM:   Vitals:   08/30/16 1243 08/30/16 1245  BP: 111/76 99/65  Pulse: (!) 181   Resp: 20   Temp: 97.5 F (36.4 C)   TempSrc: Temporal   SpO2: (!) 84%   Weight: 209 lb (94.8 kg)   Height: 5\' 8"  (1.727 m)     GENERAL: The patient is a well-nourished male, in no acute distress. The vital signs are documented above. CARDIAC: There is a regular rate and rhythm.  VASCULAR: no bruits PULMONARY: Non-labored respirations MUSCULOSKELETAL: There are no major deformities or cyanosis. NEUROLOGIC: No focal weakness or paresthesias are detected. SKIN: There are no ulcers or rashes noted. PSYCHIATRIC: The patient has a normal affect.  STUDIES:   I have reviewed his CT scan which shows bulky carotid plaque at the right carotid bifurcation and approximately 50% left-sided stenosis  MEDICAL ISSUES:   High-grade right carotid stenosis: I told the patient that I am not sure if this is the etiology of his syncopal episodes.  I would favor that this is not the case because he states that when his episodes happen he gets numbness and a shooting feeling going down the right arm and right leg.  He does not endorse any left-sided symptoms.  However because the high-grade nature of the disease I feel this needs to be addressed.  The patient cut his neck fusion is not a candidate for carotid endarterectomy and therefore he'll need a carotid stent.  I'm starting him on Plavix today.  He is artery taking an aspirin.  This is been scheduled for Thursday, March 22.  The risks and benefits of the procedure discussed the patient could there is a stroke and bleeding.    Durene Cal, MD [facility] [phone] Pager [phone]

## 2016-08-31 ENCOUNTER — Other Ambulatory Visit: Payer: Self-pay

## 2016-09-09 ENCOUNTER — Ambulatory Visit (HOSPITAL_COMMUNITY)
Admission: RE | Admit: 2016-09-09 | Discharge: 2016-09-10 | Disposition: A | Discharge status: Home / Self Care | Payer: Medicare Other | Source: Ambulatory Visit | Attending: Surgery | Admitting: Surgery

## 2016-09-09 ENCOUNTER — Encounter (HOSPITAL_COMMUNITY)
Admission: RE | Disposition: A | Discharge status: Home / Self Care | Payer: Self-pay | Source: Ambulatory Visit | Attending: Surgery

## 2016-09-09 ENCOUNTER — Other Ambulatory Visit: Payer: Self-pay

## 2016-09-09 DIAGNOSIS — Z880 Allergy status to penicillin: Secondary | ICD-10-CM | POA: Insufficient documentation

## 2016-09-09 DIAGNOSIS — J9611 Chronic respiratory failure with hypoxia: Secondary | ICD-10-CM | POA: Diagnosis not present

## 2016-09-09 DIAGNOSIS — Z981 Arthrodesis status: Secondary | ICD-10-CM | POA: Insufficient documentation

## 2016-09-09 DIAGNOSIS — I10 Essential (primary) hypertension: Secondary | ICD-10-CM | POA: Diagnosis not present

## 2016-09-09 DIAGNOSIS — R296 Repeated falls: Secondary | ICD-10-CM | POA: Diagnosis not present

## 2016-09-09 DIAGNOSIS — M1990 Unspecified osteoarthritis, unspecified site: Secondary | ICD-10-CM | POA: Diagnosis not present

## 2016-09-09 DIAGNOSIS — G629 Polyneuropathy, unspecified: Secondary | ICD-10-CM | POA: Insufficient documentation

## 2016-09-09 DIAGNOSIS — Z7982 Long term (current) use of aspirin: Secondary | ICD-10-CM | POA: Insufficient documentation

## 2016-09-09 DIAGNOSIS — Z87891 Personal history of nicotine dependence: Secondary | ICD-10-CM | POA: Insufficient documentation

## 2016-09-09 DIAGNOSIS — F329 Major depressive disorder, single episode, unspecified: Secondary | ICD-10-CM | POA: Diagnosis not present

## 2016-09-09 DIAGNOSIS — J449 Chronic obstructive pulmonary disease, unspecified: Secondary | ICD-10-CM | POA: Insufficient documentation

## 2016-09-09 DIAGNOSIS — I714 Abdominal aortic aneurysm, without rupture: Secondary | ICD-10-CM | POA: Diagnosis not present

## 2016-09-09 DIAGNOSIS — I6521 Occlusion and stenosis of right carotid artery: Secondary | ICD-10-CM | POA: Insufficient documentation

## 2016-09-09 DIAGNOSIS — Z7951 Long term (current) use of inhaled steroids: Secondary | ICD-10-CM | POA: Insufficient documentation

## 2016-09-09 DIAGNOSIS — I6529 Occlusion and stenosis of unspecified carotid artery: Secondary | ICD-10-CM | POA: Diagnosis present

## 2016-09-09 DIAGNOSIS — I6522 Occlusion and stenosis of left carotid artery: Secondary | ICD-10-CM | POA: Diagnosis not present

## 2016-09-09 DIAGNOSIS — Z8249 Family history of ischemic heart disease and other diseases of the circulatory system: Secondary | ICD-10-CM | POA: Insufficient documentation

## 2016-09-09 DIAGNOSIS — I6523 Occlusion and stenosis of bilateral carotid arteries: Secondary | ICD-10-CM

## 2016-09-09 HISTORY — PX: CAROTID ANGIOGRAPHY: CATH118230

## 2016-09-09 LAB — POCT I-STAT, CHEM 8
BUN: 34 mg/dL — AB (ref 6–20)
CALCIUM ION: 1.16 mmol/L (ref 1.15–1.40)
CREATININE: 1.3 mg/dL — AB (ref 0.61–1.24)
Chloride: 91 mmol/L — ABNORMAL LOW (ref 101–111)
Glucose, Bld: 95 mg/dL (ref 65–99)
HCT: 31 % — ABNORMAL LOW (ref 39.0–52.0)
Hemoglobin: 10.5 g/dL — ABNORMAL LOW (ref 13.0–17.0)
Potassium: 4 mmol/L (ref 3.5–5.1)
SODIUM: 137 mmol/L (ref 135–145)
TCO2: 39 mmol/L (ref 0–100)

## 2016-09-09 SURGERY — CAROTID ANGIOGRAPHY
Anesthesia: LOCAL

## 2016-09-09 MED ORDER — MORPHINE SULFATE (PF) 2 MG/ML IV SOLN: 2.0000 mg | INTRAVENOUS | Status: DC | PRN

## 2016-09-09 MED ORDER — CLOPIDOGREL BISULFATE 75 MG PO TABS
75.0000 mg | ORAL_TABLET | Freq: Once | ORAL | Status: AC
  Administered 2016-09-09: 75 mg via ORAL

## 2016-09-09 MED ORDER — OXYCODONE HCL 5 MG PO TABS
5.0000 mg | ORAL_TABLET | Freq: Three times a day (TID) | ORAL | Status: DC
  Administered 2016-09-09: 5 mg via ORAL
  Filled 2016-09-09 (×2): qty 1

## 2016-09-09 MED ORDER — TIOTROPIUM BROMIDE MONOHYDRATE 18 MCG IN CAPS
18.0000 ug | ORAL_CAPSULE | Freq: Every day | RESPIRATORY_TRACT | Status: DC
  Administered 2016-09-10: 18 ug via RESPIRATORY_TRACT
  Filled 2016-09-09: qty 5

## 2016-09-09 MED ORDER — ACETAMINOPHEN 325 MG RE SUPP: 325.0000 mg | RECTAL | Status: DC | PRN

## 2016-09-09 MED ORDER — DOCUSATE SODIUM 100 MG PO CAPS
100.0000 mg | ORAL_CAPSULE | Freq: Two times a day (BID) | ORAL | Status: DC
  Administered 2016-09-09: 100 mg via ORAL
  Filled 2016-09-09 (×3): qty 1

## 2016-09-09 MED ORDER — HEPARIN (PORCINE) IN NACL 2-0.9 UNIT/ML-% IJ SOLN
INTRAMUSCULAR | Status: DC | PRN
  Administered 2016-09-09: 1000 mL

## 2016-09-09 MED ORDER — LIDOCAINE HCL (PF) 1 % IJ SOLN
INTRAMUSCULAR | Status: AC
  Filled 2016-09-09: qty 30

## 2016-09-09 MED ORDER — SODIUM CHLORIDE 0.9 % IV SOLN: 500.0000 mL | Freq: Once | INTRAVENOUS | Status: DC | PRN

## 2016-09-09 MED ORDER — ALBUTEROL SULFATE (2.5 MG/3ML) 0.083% IN NEBU: 3.0000 mL | INHALATION_SOLUTION | RESPIRATORY_TRACT | Status: DC | PRN

## 2016-09-09 MED ORDER — ADULT MULTIVITAMIN W/MINERALS CH
1.0000 | ORAL_TABLET | Freq: Every day | ORAL | Status: DC
  Administered 2016-09-09 – 2016-09-10 (×2): 1 via ORAL
  Filled 2016-09-09 (×2): qty 1

## 2016-09-09 MED ORDER — ASPIRIN 81 MG PO CHEW
CHEWABLE_TABLET | ORAL | Status: AC
  Administered 2016-09-09: 81 mg via ORAL
  Filled 2016-09-09: qty 1

## 2016-09-09 MED ORDER — QUETIAPINE FUMARATE 50 MG PO TABS
50.0000 mg | ORAL_TABLET | Freq: Every day | ORAL | Status: DC
  Administered 2016-09-09: 50 mg via ORAL
  Filled 2016-09-09: qty 1
  Filled 2016-09-09: qty 2

## 2016-09-09 MED ORDER — CLOPIDOGREL BISULFATE 75 MG PO TABS
ORAL_TABLET | ORAL | Status: AC
  Administered 2016-09-09: 75 mg via ORAL
  Filled 2016-09-09: qty 1

## 2016-09-09 MED ORDER — LABETALOL HCL 5 MG/ML IV SOLN: 10.0000 mg | INTRAVENOUS | Status: DC | PRN

## 2016-09-09 MED ORDER — TORSEMIDE 20 MG PO TABS
20.0000 mg | ORAL_TABLET | Freq: Every day | ORAL | Status: DC
  Administered 2016-09-09 – 2016-09-10 (×2): 20 mg via ORAL
  Filled 2016-09-09 (×2): qty 1

## 2016-09-09 MED ORDER — LIDOCAINE HCL (PF) 1 % IJ SOLN
INTRAMUSCULAR | Status: DC | PRN
  Administered 2016-09-09: 18 mL

## 2016-09-09 MED ORDER — BUDESONIDE 0.5 MG/2ML IN SUSP
0.5000 mg | Freq: Two times a day (BID) | RESPIRATORY_TRACT | Status: DC
  Administered 2016-09-10: 0.5 mg via RESPIRATORY_TRACT
  Filled 2016-09-09 (×2): qty 2

## 2016-09-09 MED ORDER — ONDANSETRON HCL 4 MG/2ML IJ SOLN: 4.0000 mg | Freq: Four times a day (QID) | INTRAMUSCULAR | Status: DC | PRN

## 2016-09-09 MED ORDER — SERTRALINE HCL 50 MG PO TABS
75.0000 mg | ORAL_TABLET | Freq: Every day | ORAL | Status: DC
  Administered 2016-09-09 – 2016-09-10 (×2): 75 mg via ORAL
  Filled 2016-09-09 (×2): qty 1

## 2016-09-09 MED ORDER — METOPROLOL TARTRATE 25 MG PO TABS
25.0000 mg | ORAL_TABLET | Freq: Two times a day (BID) | ORAL | Status: DC
  Administered 2016-09-09 – 2016-09-10 (×2): 25 mg via ORAL
  Filled 2016-09-09 (×3): qty 1

## 2016-09-09 MED ORDER — SODIUM CHLORIDE 0.9 % IV SOLN
INTRAVENOUS | Status: DC
  Administered 2016-09-09: 16:00:00 via INTRAVENOUS

## 2016-09-09 MED ORDER — ASPIRIN 81 MG PO CHEW
81.0000 mg | CHEWABLE_TABLET | Freq: Once | ORAL | Status: AC
  Administered 2016-09-09: 81 mg via ORAL

## 2016-09-09 MED ORDER — LISINOPRIL 10 MG PO TABS
20.0000 mg | ORAL_TABLET | Freq: Two times a day (BID) | ORAL | Status: DC
  Administered 2016-09-09 – 2016-09-10 (×2): 20 mg via ORAL
  Filled 2016-09-09: qty 2
  Filled 2016-09-09: qty 1
  Filled 2016-09-09: qty 2
  Filled 2016-09-09: qty 1
  Filled 2016-09-09: qty 2

## 2016-09-09 MED ORDER — HYDRALAZINE HCL 20 MG/ML IJ SOLN: 5.0000 mg | INTRAMUSCULAR | Status: DC | PRN

## 2016-09-09 MED ORDER — MORPHINE SULFATE (PF) 10 MG/ML IV SOLN: 2.0000 mg | INTRAVENOUS | Status: DC | PRN

## 2016-09-09 MED ORDER — METOPROLOL TARTRATE 5 MG/5ML IV SOLN: 2.0000 mg | INTRAVENOUS | Status: DC | PRN

## 2016-09-09 MED ORDER — ASPIRIN EC 325 MG PO TBEC
325.0000 mg | DELAYED_RELEASE_TABLET | Freq: Every day | ORAL | Status: DC
  Administered 2016-09-09 – 2016-09-10 (×2): 325 mg via ORAL
  Filled 2016-09-09 (×2): qty 1

## 2016-09-09 MED ORDER — IODIXANOL 320 MG/ML IV SOLN
INTRAVENOUS | Status: DC | PRN
  Administered 2016-09-09: 120 mL via INTRAVENOUS

## 2016-09-09 MED ORDER — OXYCODONE HCL 5 MG PO TABS: 5.0000 mg | ORAL_TABLET | ORAL | Status: DC | PRN

## 2016-09-09 MED ORDER — PHENOL 1.4 % MT LIQD: 1.0000 | OROMUCOSAL | Status: DC | PRN

## 2016-09-09 MED ORDER — SODIUM CHLORIDE 0.9 % IV SOLN
INTRAVENOUS | Status: DC
  Administered 2016-09-09: 07:00:00 via INTRAVENOUS

## 2016-09-09 MED ORDER — HEPARIN (PORCINE) IN NACL 2-0.9 UNIT/ML-% IJ SOLN
INTRAMUSCULAR | Status: AC
  Filled 2016-09-09: qty 1000

## 2016-09-09 MED ORDER — GUAIFENESIN-DM 100-10 MG/5ML PO SYRP: 15.0000 mL | ORAL_SOLUTION | ORAL | Status: DC | PRN

## 2016-09-09 MED ORDER — CLOPIDOGREL BISULFATE 75 MG PO TABS
75.0000 mg | ORAL_TABLET | Freq: Every day | ORAL | Status: DC
Start: 2016-09-10 — End: 2016-09-10
  Administered 2016-09-10: 75 mg via ORAL
  Filled 2016-09-09: qty 1

## 2016-09-09 MED ORDER — CLONAZEPAM 0.5 MG PO TABS
0.5000 mg | ORAL_TABLET | Freq: Two times a day (BID) | ORAL | Status: DC
  Administered 2016-09-09 – 2016-09-10 (×2): 0.5 mg via ORAL
  Filled 2016-09-09 (×3): qty 1

## 2016-09-09 MED ORDER — ACETAMINOPHEN 325 MG PO TABS: 325.0000 mg | ORAL_TABLET | ORAL | Status: DC | PRN

## 2016-09-09 MED ORDER — METOCLOPRAMIDE HCL 5 MG PO TABS
2.5000 mg | ORAL_TABLET | Freq: Four times a day (QID) | ORAL | Status: DC
Start: 2016-09-09 — End: 2016-09-10
  Administered 2016-09-09 – 2016-09-10 (×4): 2.5 mg via ORAL
  Filled 2016-09-09 (×4): qty 1

## 2016-09-09 MED ORDER — FOLIC ACID 1 MG PO TABS
1.0000 mg | ORAL_TABLET | Freq: Every day | ORAL | Status: DC
  Administered 2016-09-09 – 2016-09-10 (×2): 1 mg via ORAL
  Filled 2016-09-09 (×2): qty 1

## 2016-09-09 MED ORDER — BIVALIRUDIN 250 MG IV SOLR
INTRAVENOUS | Status: AC
  Filled 2016-09-09: qty 250

## 2016-09-09 SURGICAL SUPPLY — 18 items
CATH ANGIO 5F BER2 100CM (CATHETERS) ×3 IMPLANT
CATH ANGIO 5F PIGTAIL 100CM (CATHETERS) ×3 IMPLANT
COVER PRB 48X5XTLSCP FOLD TPE (BAG) ×2 IMPLANT
COVER PROBE 5X48 (BAG) ×1
KIT PV (KITS) ×3 IMPLANT
SHEATH PINNACLE 5F 10CM (SHEATH) IMPLANT
SHEATH PINNACLE 6F 10CM (SHEATH) ×3 IMPLANT
SHEATH PINNACLE 7F 10CM (SHEATH) ×3 IMPLANT
STOPCOCK MORSE 400PSI 3WAY (MISCELLANEOUS) ×3 IMPLANT
SYRINGE MEDRAD AVANTA MACH 7 (SYRINGE) ×3 IMPLANT
TRANSDUCER W/STOPCOCK (MISCELLANEOUS) ×3 IMPLANT
TRAY PV CATH (CUSTOM PROCEDURE TRAY) ×3 IMPLANT
TUBING CIL FLEX 10 FLL-RA (TUBING) ×3 IMPLANT
WIRE AMPLATZ SSTIFF .035X260CM (WIRE) IMPLANT
WIRE BENTSON .035X145CM (WIRE) ×3 IMPLANT
WIRE HITORQ VERSACORE ST 145CM (WIRE) ×3 IMPLANT
WIRE MINI STICK MAX (SHEATH) ×3 IMPLANT
WIRE ROSEN-J .035X180CM (WIRE) ×3 IMPLANT

## 2016-09-09 NOTE — Op Note (Signed)
    Patient name: Chad LabradorDanny L Laton MRN: [id number] DOB: 02-21-1946 Sex: male  09/09/2016 Pre-operative Diagnosis: right carotid stenosis Post-operative diagnosis:  Same Surgeon:  Durene CalBrabham, [place] Procedure Performed:  1.  u/s guided access, right femoral artery  2.  Aortic arch angiogram  3.  2nd order catheterization  4.  Right carotid angiogram     Indications:  The patient has had multiple falls.  His work up revealed right carotid stenosis.  He is not a candidate for CEA b/c of his neck fusion.  Procedure:  The patient was identified in the holding area and taken to room 8.  The patient was then placed supine on the table and prepped and draped in the usual sterile fashion.  A time out was called.  Ultrasound was used to evaluate the right common femoral artery.  It was patent .  A digital ultrasound image was acquired.  A micropuncture needle was used to access the right common femoral artery under ultrasound guidance.  An 018 wire was advanced without resistance and a micropuncture sheath was placed.  The 018 wire was removed and a benson wire was placed.  The micropuncture sheath was exchanged for a 5 french sheath.  There was dense scar tissue and so a 64F dilator was used to place a 38F sheath.  A pigtail catheter was placed into the ascending aorta and an aortic arch angiogram was performed.  Next, A berenstein 2 catheter was used to select the right carotid artery and a right carotid angiogram was performed followed by intracranial images which will be interpreted by neuroradiology Findings:   Aortic arch:  A type I aortic arch is identified.  No significant great vessel stenosis is identified after ostium.  The visualized portion of the left subclavian artery are patent without significant stenosis.  The innominate artery and right subclavian artery are patent.  There is no significant stenosis.  Right carotid artery:  60% stenosis is identified at the carotid bifurcation   Intervention:   none  Impression:  #1  60% right carotid stenosis    V. Durene CalWells Cynethia Schindler, M.D. [facility] [place] Office: [phone] Pager:  [phone]

## 2016-09-09 NOTE — Progress Notes (Signed)
Patient admitted to 2W35, A&Ox4, VSS. Tele applied and CCMD notified.  

## 2016-09-09 NOTE — Progress Notes (Signed)
Site area: Right groin a 6 french arterial sheath was removed  Site Prior to Removal:  Level 0  Pressure Applied For 15 MINUTES    Bedrest Beginning at 0915a  Manual:   Yes.    Patient Status During Pull:  stable  Post Pull Groin Site:  Level 0  Post Pull Instructions Given:  Yes.    Post Pull Pulses Present:  Yes.    Dressing Applied:  Yes.    Comments:  VS remain stable during sheath pull 

## 2016-09-09 NOTE — H&P (View-Only) (Signed)
[facility] [place]  Patient name: Chad Huffman MRN: [id number] DOB: 1946-03-15 Sex: male   REASON FOR VISIT:    F/u carotid stenosis  HISOTRY OF PRESENT ILLNESS:    Chad Huffman is a 71 y.o. male who comes in today for follow-up.  He is status post endovascular repair of an abdominal aortic aneurysm on 10/11/2007.  This has been stable.  He also has a history of carotid occlusive disease.  By ultrasound he had progression to greater than 80% on the right and underwent angiography in 2016 where approximate 70% stenosis was identified.  Because of the tortuosity, no intervention was performed.  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  The patient has a spine and neck fusion.  He was recently admitted to the hospital with acute on chronic respiratory failure with altered mental status.  He was found by EMS to be hypoxic with O2 sats in the 60s.  He is very drowsy lethargic.  During his workup he underwent carotid Doppler studies which showed greater than 70% stenosis in the right 50-60 in the left.  CT angiogram showed bulky calcified plaque on the right 50% stenosis on the left patient is on aspirin as single agent antiplatelet therapy.  He has had multiple falls recently.  PAST MEDICAL HISTORY:   Past Medical History:  Diagnosis Date  . Antalgic gait   . Carotid artery occlusion   . Chronic neck and back pain   . Complication of anesthesia    pt has had cervical fusion-limited neck flextion  . COPD (chronic obstructive pulmonary disease) (HCC)   . Depression   . DJD (degenerative joint disease)    Right shoulder  . Hypertension   . Lumbago   . Peripheral neuropathy (HCC)      FAMILY HISTORY:   Family History  Problem Relation Age of Onset  . Stomach cancer Mother   . Hypertension Brother   . Heart attack Father     SOCIAL HISTORY:   Social  History  Substance Use Topics  . Smoking status: Former Smoker    Quit date: 08/30/2006  . Smokeless tobacco: Never Used  . Alcohol use 1.2 oz/week    2 Cans of beer per week     Comment: occasional use     ALLERGIES:   Allergies  Allergen Reactions  . Ampicillin Swelling and Other (See Comments)    Unable to obtain enough information to answer additional questions about this medication.  Has patient had a PCN reaction causing immediate rash, facial/tongue/throat swelling, SOB or lightheadedness with hypotension: Yes Has patient had a PCN reaction causing severe rash involving mucus membranes or skin necrosis: No Has patient had a PCN reaction that required hospitalization No Has patient had a PCN reaction occurring within the last 10 years: Yes If all of the above answers are "NO", then may pr  . Nortriptyline Other (See Comments)    Sleep walking  . Brimonidine Other (See Comments)    Unknown reaction per MAR   . Cyclobenzaprine Other (See Comments)    Hallucinations  . Tizanidine Other (See Comments)    Hallucinations  . Gabapentin Rash     CURRENT MEDICATIONS:   Current Outpatient Prescriptions  Medication Sig Dispense Refill  . albuterol (ACCUNEB) 1.25 MG/3ML nebulizer solution Take 1 ampule by nebulization every 4 (four) hours as needed for wheezing.    Marland Kitchen aspirin EC 325  MG tablet Take 1 tablet (325 mg total) by mouth daily. 30 tablet 0  . budesonide (PULMICORT) 0.5 MG/2ML nebulizer solution Take 0.5 mg by nebulization 2 (two) times daily.    . clonazePAM (KLONOPIN) 0.5 MG tablet Take 1 tablet (0.5 mg total) by mouth 2 (two) times daily. 30 tablet 0  . docusate sodium (COLACE) 100 MG capsule Take 100 mg by mouth 2 (two) times daily.     . folic acid (FOLVITE) 1 MG tablet Take 1 mg by mouth daily.     Marland Kitchen. lisinopril (PRINIVIL,ZESTRIL) 20 MG tablet Take 1 tablet (20 mg total) by mouth 2 (two) times daily. 60 tablet 6  . metoCLOPramide (REGLAN) 5 MG tablet Take 2.5 mg by  mouth every 6 (six) hours.     . metoprolol tartrate (LOPRESSOR) 25 MG tablet Take 1 tablet (25 mg total) by mouth 2 (two) times daily. 60 tablet 6  . Multiple Vitamin (MULTIVITAMIN WITH MINERALS) TABS tablet Take 1 tablet by mouth daily.    Marland Kitchen. oxyCODONE (OXY IR/ROXICODONE) 5 MG immediate release tablet Take 1 tablet (5 mg total) by mouth every 8 (eight) hours. 30 tablet 0  . OXYGEN 4 L/min by Intratracheal route as needed (for shortness of breath).     . predniSONE (STERAPRED UNI-PAK 21 TAB) 10 MG (21) TBPK tablet Please take 6 pills in the morning on the day 1 and 2, then taper by one pill every 2 days until finished, thank you 42 tablet 0  . QUEtiapine (SEROQUEL) 50 MG tablet Take 50 mg by mouth at bedtime.    . sertraline (ZOLOFT) 50 MG tablet Take 75 mg by mouth daily.     Marland Kitchen. tiotropium (SPIRIVA) 18 MCG inhalation capsule Place 1 capsule (18 mcg total) into inhaler and inhale daily. 30 capsule 12  . torsemide (DEMADEX) 20 MG tablet Take 20 mg by mouth daily.      No current facility-administered medications for this visit.     REVIEW OF SYSTEMS:   [X]  denotes positive finding, [ ]  denotes negative finding Cardiac  Comments:  Chest pain or chest pressure:    Shortness of breath upon exertion: x   Short of breath when lying flat: x   Irregular heart rhythm:        Vascular    Pain in calf, thigh, or hip brought on by ambulation:    Pain in feet at night that wakes you up from your sleep:     Blood clot in your veins:    Leg swelling:         Pulmonary    Oxygen at home:    Productive cough:     Wheezing:         Neurologic    Sudden weakness in arms or legs:     Sudden numbness in arms or legs:  x   Sudden onset of difficulty speaking or slurred speech:    Temporary loss of vision in one eye:  x   Problems with dizziness:         Gastrointestinal    Blood in stool:     Vomited blood:         Genitourinary    Burning when urinating:     Blood in urine:          Psychiatric    Major depression:         Hematologic    Bleeding problems:    Problems with blood clotting too easily:  Skin    Rashes or ulcers:        Constitutional    Fever or chills:      PHYSICAL EXAM:   Vitals:   08/30/16 1243 08/30/16 1245  BP: 111/76 99/65  Pulse: (!) 181   Resp: 20   Temp: 97.5 F (36.4 C)   TempSrc: Temporal   SpO2: (!) 84%   Weight: 209 lb (94.8 kg)   Height: 5\' 8"  (1.727 m)     GENERAL: The patient is a well-nourished male, in no acute distress. The vital signs are documented above. CARDIAC: There is a regular rate and rhythm.  VASCULAR: no bruits PULMONARY: Non-labored respirations MUSCULOSKELETAL: There are no major deformities or cyanosis. NEUROLOGIC: No focal weakness or paresthesias are detected. SKIN: There are no ulcers or rashes noted. PSYCHIATRIC: The patient has a normal affect.  STUDIES:   I have reviewed his CT scan which shows bulky carotid plaque at the right carotid bifurcation and approximately 50% left-sided stenosis  MEDICAL ISSUES:   High-grade right carotid stenosis: I told the patient that I am not sure if this is the etiology of his syncopal episodes.  I would favor that this is not the case because he states that when his episodes happen he gets numbness and a shooting feeling going down the right arm and right leg.  He does not endorse any left-sided symptoms.  However because the high-grade nature of the disease I feel this needs to be addressed.  The patient cut his neck fusion is not a candidate for carotid endarterectomy and therefore he'll need a carotid stent.  I'm starting him on Plavix today.  He is artery taking an aspirin.  This is been scheduled for Thursday, March 22.  The risks and benefits of the procedure discussed the patient could there is a stroke and bleeding.    Durene Cal, MD [facility] [phone] Pager [phone]

## 2016-09-09 NOTE — Interval H&P Note (Signed)
History and Physical Interval Note:  09/09/2016 7:29 AM  Chad Huffman  has presented today for surgery, with the diagnosis of right carotid stenosis  The various methods of treatment have been discussed with the patient and family. After consideration of risks, benefits and other options for treatment, the patient has consented to  Procedure(s): Carotid PTA/Stent Intervention (N/A) as a surgical intervention .  The patient's history has been reviewed, patient examined, no change in status, stable for surgery.  I have reviewed the patient's chart and labs.  Questions were answered to the patient's satisfaction.     Durene CalBrabham, [place]

## 2016-09-10 ENCOUNTER — Encounter (HOSPITAL_COMMUNITY): Payer: Self-pay | Admitting: Surgery

## 2016-09-10 ENCOUNTER — Telehealth: Payer: Self-pay | Admitting: Surgery

## 2016-09-10 DIAGNOSIS — I6521 Occlusion and stenosis of right carotid artery: Secondary | ICD-10-CM | POA: Diagnosis not present

## 2016-09-10 LAB — BASIC METABOLIC PANEL
Anion gap: 9 (ref 5–15)
BUN: 27 mg/dL — ABNORMAL HIGH (ref 6–20)
CHLORIDE: 90 mmol/L — AB (ref 101–111)
CO2: 38 mmol/L — AB (ref 22–32)
Calcium: 8.7 mg/dL — ABNORMAL LOW (ref 8.9–10.3)
Creatinine, Ser: 1.31 mg/dL — ABNORMAL HIGH (ref 0.61–1.24)
GFR calc non Af Amer: 54 mL/min — ABNORMAL LOW (ref >60)
Glucose, Bld: 89 mg/dL (ref 65–99)
POTASSIUM: 4.2 mmol/L (ref 3.5–5.1)
SODIUM: 137 mmol/L (ref 135–145)

## 2016-09-10 LAB — CBC
HEMATOCRIT: 30.9 % — AB (ref 39.0–52.0)
HEMOGLOBIN: 9.4 g/dL — AB (ref 13.0–17.0)
MCH: 28.7 pg (ref 26.0–34.0)
MCHC: 30.4 g/dL (ref 30.0–36.0)
MCV: 94.2 fL (ref 78.0–100.0)
Platelets: 113 10*3/uL — ABNORMAL LOW (ref 150–400)
RBC: 3.28 MIL/uL — AB (ref 4.22–5.81)
RDW: 16 % — ABNORMAL HIGH (ref 11.5–15.5)
WBC: 5.6 10*3/uL (ref 4.0–10.5)

## 2016-09-10 NOTE — Telephone Encounter (Signed)
-----   Message from Phillips Odorarol S Pullins, RN sent at 09/09/2016  4:01 PM EDT ----- Regarding: needs 6 mo. carotid duplex and OV with NP on [facility] office day   ----- Message ----- From: Nada LibmanVance W Brabham, MD Sent: 09/09/2016   2:59 PM To: [facility] Charge Pool  09-09-2016: Surgeon:  Durene CalBrabham, [place] Procedure Performed:  1.  u/s guided access, right femoral artery  2.  Aortic arch angiogram  3.  2nd order catheterization  4.  Right carotid angiogram  f/u 6 months with carotid u/s to see Rosalita ChessmanSuzanne

## 2016-09-10 NOTE — Discharge Summary (Signed)
Discharge Summary    Chad Huffman May 17, 1946 71 y.o. male  [id number]  Admission Date: 09/09/2016  Discharge Date: 09/10/16  Physician: Nada Libman, MD  Admission Diagnosis: Carotid stenosis [I65.29]   HPI:   This is a 71 y.o. male who comes in today for follow-up.  He is status post endovascular repair of an abdominal aortic aneurysm on 10/11/2007.  This has been stable.  He also has a history of carotid occlusive disease.  By ultrasound he had progression to greater than 80% on the right and underwent angiography in 2016 where approximate 70% stenosis was identified.  Because of the tortuosity, no intervention was performed.  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  The patient has a spine and neck fusion.  He was recently admitted to the hospital with acute on chronic respiratory failure with altered mental status.  He was found by EMS to be hypoxic with O2 sats in the 60s.  He is very drowsy lethargic.  During his workup he underwent carotid Doppler studies which showed greater than 70% stenosis in the right 50-60 in the left.  CT angiogram showed bulky calcified plaque on the right 50% stenosis on the left patient is on aspirin as single agent antiplatelet therapy.  He has had multiple falls recently.  Hospital Course:  The patient was admitted to the hospital and taken to the operating room on 09/09/2016 and underwent: Procedure Performed:             1.  u/s guided access, right femoral artery             2.  Aortic arch angiogram             3.  2nd order catheterization             4.  Right carotid angiogram    Intraoperative finding include the following: Findings:              Aortic arch:  A type I aortic arch is identified. No significant great vessel stenosis is identified after ostium. The visualized portion of the left subclavian artery are patent without significant stenosis. The  innominate artery and right subclavian artery are patent. There is no significant stenosis.             Right carotid artery:  60% stenosis is identified at the carotid bifurcation  No intervention was performed.  The pt tolerated the procedure well and was transported to the PACU in good condition.   Pt requested evaluation for HHPT.  PT evaluated the pt and recommended HHPT, which was arranged by case management.  He was discharged later that day.   The remainder of the hospital course consisted of increasing mobilization and increasing intake of solids without difficulty.  CBC    Component Value Date/Time   WBC 5.6 09/10/2016 0206   RBC 3.28 (L) 09/10/2016 0206   HGB 9.4 (L) 09/10/2016 0206   HGB 12.0 (L) 05/28/2014 2308   HCT 30.9 (L) 09/10/2016 0206   HCT 37.3 (L) 05/28/2014 2308   PLT 113 (L) 09/10/2016 0206   PLT 141 (L) 05/28/2014 2308   MCV 94.2 09/10/2016 0206   MCV 97 05/28/2014 2308   MCH 28.7 09/10/2016 0206   MCHC 30.4 09/10/2016 0206   RDW 16.0 (H) 09/10/2016 0206   RDW 14.7 (H) 05/28/2014 2308   LYMPHSABS 0.6 (L) 08/19/2016 1930   MONOABS 0.8  08/19/2016 1930   EOSABS 0.3 08/19/2016 1930   BASOSABS 0.0 08/19/2016 1930    BMET    Component Value Date/Time   NA 137 09/10/2016 0206   NA 139 06/10/2016   NA 130 (L) 05/28/2014 2137   K 4.2 09/10/2016 0206   K 3.7 05/28/2014 2137   CL 90 (L) 09/10/2016 0206   CL 90 (L) 05/28/2014 2137   CO2 38 (H) 09/10/2016 0206   CO2 34 (H) 05/28/2014 2137   GLUCOSE 89 09/10/2016 0206   GLUCOSE 87 05/28/2014 2137   BUN 27 (H) 09/10/2016 0206   BUN 14 06/10/2016   BUN 8 05/28/2014 2137   CREATININE 1.31 (H) 09/10/2016 0206   CREATININE 1.00 05/28/2014 2137   CALCIUM 8.7 (L) 09/10/2016 0206   CALCIUM 8.3 (L) 05/28/2014 2137   GFRNONAA 54 (L) 09/10/2016 0206   GFRNONAA >60 05/28/2014 2137   GFRNONAA >60 12/22/2012 2219   GFRAA >60 09/10/2016 0206   GFRAA >60 05/28/2014 2137   GFRAA >60 12/22/2012 2219       Discharge Instructions    Call MD for:  redness, tenderness, or signs of infection (pain, swelling, bleeding, redness, odor or green/yellow discharge around incision site)    Complete by:  As directed    Call MD for:  severe or increased pain, loss or decreased feeling  in affected limb(s)    Complete by:  As directed    Call MD for:  temperature >100.5    Complete by:  As directed    Discharge wound care:    Complete by:  As directed    Shower daily with soap and water starting 09/10/16   Driving Restrictions    Complete by:  As directed    No driving for 24 hours.   Lifting restrictions    Complete by:  As directed    No lifting for 2 weeks   Resume previous diet    Complete by:  As directed       Discharge Diagnosis:  Carotid stenosis [I65.29]  Secondary Diagnosis: Patient Active Problem List   Diagnosis Date Noted  . Carotid stenosis 09/09/2016  . Carotid artery stenosis 08/23/2016  . Pancytopenia (HCC) 08/23/2016  . Hyperkalemia 08/23/2016  . Acute on chronic diastolic CHF (congestive heart failure) (HCC) 07/27/2016  . Leukocytosis 07/27/2016  . Thrombocytopenia (HCC) 07/27/2016  . Anemia 07/27/2016  . Acute on chronic respiratory failure with hypoxia and hypercapnia (HCC) 07/16/2016  . 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 Dysphagia, pharyngoesophageal phase 05/18/2016  . GERD without esophagitis 05/09/2016  . Bilateral lower extremity edema 05/09/2016  . Slow transit constipation 05/09/2016  . Depression with anxiety 05/09/2016  . Respiratory failure with hypoxia and hypercapnia (HCC) 07/22/2015  . Cervical vertebral fusion   . Depression   . Chronic neck and back pain   . Acute on chronic respiratory failure with hypercapnia (HCC)   . COPD exacerbation (HCC)   . HCAP (healthcare-associated pneumonia)   . Congestive dilated cardiomyopathy (HCC)   . Syncope and collapse   . Arthritis of right hip  08/04/2014  . Essential hypertension 08/04/2014  . Hypothyroidism 08/04/2014  . Syncope 08/04/2014  . Fall 08/04/2014   Past Medical History:  Diagnosis Date  . Antalgic gait   . Carotid artery occlusion   . Chronic neck and back pain   . Complication of anesthesia    pt has had cervical fusion-limited neck flextion  . COPD (chronic obstructive  pulmonary disease) (HCC)   . Depression   . DJD (degenerative joint disease)    Right shoulder  . Hypertension   . Lumbago   . Peripheral neuropathy (HCC)      Allergies as of 09/10/2016      Reactions   Ampicillin Swelling, Other (See Comments)   Unable to obtain enough information to answer additional questions about this medication.  Has patient had a PCN reaction causing immediate rash, facial/tongue/throat swelling, SOB or lightheadedness with hypotension: Yes Has patient had a PCN reaction causing severe rash involving mucus membranes or skin necrosis: No Has patient had a PCN reaction that required hospitalization No Has patient had a PCN reaction occurring within the last 10 years: Yes If all of the above answers are "NO", then may pr   Nortriptyline Other (See Comments)   Sleep walking   Brimonidine Other (See Comments)   Unknown reaction per [facility]    Cyclobenzaprine Other (See Comments)   Hallucinations   Tizanidine Other (See Comments)   Hallucinations   Gabapentin Rash      Medication List    TAKE these medications   albuterol 1.25 MG/3ML nebulizer solution Commonly known as:  ACCUNEB Take 1 ampule by nebulization every 4 (four) hours as needed for wheezing.   aspirin EC 325 MG tablet Take 1 tablet (325 mg total) by mouth daily.   budesonide 0.5 MG/2ML nebulizer solution Commonly known as:  PULMICORT Take 0.5 mg by nebulization 2 (two) times daily.   clonazePAM 0.5 MG tablet Commonly known as:  KLONOPIN Take 1 tablet (0.5 mg total) by mouth 2 (two) times daily.   clopidogrel 75 MG tablet Commonly known as:   PLAVIX Take 1 tablet (75 mg total) by mouth daily.   docusate sodium 100 MG capsule Commonly known as:  COLACE Take 100 mg by mouth 2 (two) times daily.   folic acid 1 MG tablet Commonly known as:  FOLVITE Take 1 mg by mouth daily.   lisinopril 20 MG tablet Commonly known as:  PRINIVIL,ZESTRIL Take 1 tablet (20 mg total) by mouth 2 (two) times daily.   metoCLOPramide 5 MG tablet Commonly known as:  REGLAN Take 2.5 mg by mouth every 6 (six) hours.   metoprolol tartrate 25 MG tablet Commonly known as:  LOPRESSOR Take 1 tablet (25 mg total) by mouth 2 (two) times daily.   multivitamin with minerals Tabs tablet Take 1 tablet by mouth daily.   oxyCODONE 5 MG immediate release tablet Commonly known as:  Oxy IR/ROXICODONE Take 1 tablet (5 mg total) by mouth every 8 (eight) hours.   OXYGEN 4 L/min by Intratracheal route as needed (for shortness of breath).   predniSONE 10 MG (21) Tbpk tablet Commonly known as:  STERAPRED UNI-PAK 21 TAB Please take 6 pills in the morning on the day 1 and 2, then taper by one pill every 2 days until finished, thank you   QUEtiapine 50 MG tablet Commonly known as:  SEROQUEL Take 50 mg by mouth at bedtime.   sertraline 50 MG tablet Commonly known as:  ZOLOFT Take 75 mg by mouth daily.   tiotropium 18 MCG inhalation capsule Commonly known as:  SPIRIVA Place 1 capsule (18 mcg total) into inhaler and inhale daily.   torsemide 20 MG tablet Commonly known as:  DEMADEX Take 20 mg by mouth daily.       Prescriptions given: None given  Instructions: 1.  No driving for 24 hours and while taking pain medication 2.  No heavy lifting x 2 weeks 3.  Shower daily starting 09/10/16  Disposition: home with HHPT  Patient's condition: is Good  Follow up: 1. Dr. Myra Gianotti in 6 months with carotid duplex.   Doreatha Massed, PA-C [facility] [phone] 09/10/2016  8:03 AM

## 2016-09-10 NOTE — Care Management Note (Addendum)
Case Management Note Chad PieriniKristi Tametha Banning RN, BSN Unit 2W-Case Manager [phone]   Patient Details  Name: Chad Huffman MRN: [id number] Date of Birth: 12/05/1945  Subjective/Objective:  Pt presented post carotid angiogram                 Action/Plan: PTA pt lived at home alone, had recent stay in [facility] [place]-  Per PT eval recommendation for HHPT/aide- orders have been placed- spoke with pt at bedside- [facility] [place]- per pt he would like to use [facility]- pt reports that his brother will come take him home, he has needed DME and home 02 already. [facility] for HH[facility]/aide.   Expected Discharge Date:  09/10/16               Expected Discharge Plan:  Home w Home Health Services  In-House Referral:     Discharge planning Services  CM Consult  Post Acute Care Choice:  Home Health Choice offered to:  Patient  DME Arranged:    DME Agency:     HH Arranged:  PT, Nurse's Aide [facility]  Status of Service:  Completed, signed off  If discussed at Long Length of Stay Meetings, dates discussed:    Discharge Disposition: home/home health   Additional Comments:  09/10/16- 1700- Chad PieriniKristi Tiyanna Larcom RN, CM- [facility] [place] [facility]- [facility] conversation [facility] and will resume services with them- call made to mary with [facility] to notify her of [facility].   Darrold SpanWebster, Takeru Bose Hall, RN 09/10/2016, 11:49 AM

## 2016-09-10 NOTE — Evaluation (Signed)
Physical Therapy Evaluation Patient Details Name: Chad Huffman MRN: [id number] DOB: 03/04/1946 Today's Date: 09/10/2016   History of Present Illness  pt is a 71 y/o male s/p carotid diagnostic study performed on 3/22 found to have 60% stenosis of R carotid. PMH including but not limited to COPD, HTN peripheral neuropathy and spine sx in 2013.  Clinical Impression  Pt presented supine in bed with HOB elevated, awake and willing to participate in therapy session. Prior to admission, pt reported that he was mod I with functional mobility with use of RW to ambulate and independent with ADLs. Pt currently requires supervision for transfers and min guard for safety with ambulation using a RW. All VSS throughout. Pt would greatly benefit from HHPT services as well as having the assistance of a HH aide. Pt would continue to benefit from skilled physical therapy services at this time while admitted and after d/c to address his below listed limitations in order to improve his overall safety and independence with functional mobility.      Follow Up Recommendations Home health PT;Supervision/Assistance - 24 hour;Other (comment) (HH aide)    Equipment Recommendations  None recommended by PT    Recommendations for Other Services       Precautions / Restrictions Precautions Precautions: Fall Restrictions Weight Bearing Restrictions: No      Mobility  Bed Mobility Overal bed mobility: Modified Independent             General bed mobility comments: increased time  Transfers Overall transfer level: Needs assistance Equipment used: Rolling walker (2 wheeled) Transfers: Sit to/from Stand Sit to Stand: Supervision         General transfer comment: increased time, good technique and supervision for safety  Ambulation/Gait Ambulation/Gait assistance: Min guard Ambulation Distance (Feet): 50 Feet Assistive device: Rolling walker (2 wheeled) Gait Pattern/deviations: Step-through  pattern;Decreased step length - right;Decreased step length - left;Decreased stride length Gait velocity: decreased Gait velocity interpretation: Below normal speed for age/gender General Gait Details: pt demonstrated safety with use of RW, mild instability but no LOB or need for physical assistance.   Stairs            Wheelchair Mobility    Modified Rankin (Stroke Patients Only)       Balance Overall balance assessment: Needs assistance Sitting-balance support: No upper extremity supported;Feet supported Sitting balance-Leahy Scale: Good     Standing balance support: Bilateral upper extremity supported Standing balance-Leahy Scale: Poor Standing balance comment: pt reliant on bilateral UEs on RW                             Pertinent Vitals/Pain Pain Assessment: No/denies pain    Home Living Family/patient expects to be discharged to:: Private residence Living Arrangements: Alone Available Help at Discharge: Family;Friend(s);Available PRN/intermittently Type of Home: House Home Access: Stairs to enter Entrance Stairs-Rails: Right;Left;Can reach both Entrance Stairs-Number of Steps: 4 Home Layout: One level Home Equipment: Walker - 2 wheels;Shower seat;Wheelchair - manual      Prior Function Level of Independence: Independent with assistive device(s)         Comments: pt reported that he ambulates with RW and has multiple falls (at least 10 in the past 6 months) secondary to syncopal episodes     Hand Dominance        Extremity/Trunk Assessment   Upper Extremity Assessment Upper Extremity Assessment: Overall WFL for tasks assessed    Lower Extremity Assessment  Lower Extremity Assessment: Overall WFL for tasks assessed       Communication   Communication: No difficulties  Cognition Arousal/Alertness: Awake/alert Behavior During Therapy: WFL for tasks assessed/performed Overall Cognitive Status: Within Functional Limits for tasks  assessed                      General Comments      Exercises     Assessment/Plan    PT Assessment Patient needs continued PT services  PT Problem List Decreased strength;Decreased activity tolerance;Decreased balance;Decreased mobility;Decreased coordination;Decreased knowledge of use of DME;Decreased safety awareness;Cardiopulmonary status limiting activity       PT Treatment Interventions DME instruction;Stair training;Gait training;Functional mobility training;Therapeutic exercise;Therapeutic activities;Balance training;Neuromuscular re-education;Patient/family education    PT Goals (Current goals can be found in the Care Plan section)  Acute Rehab PT Goals Patient Stated Goal: to return home today and participate in HHPT PT Goal Formulation: With patient Time For Goal Achievement: 09/24/16 Potential to Achieve Goals: Good    Frequency Min 3X/week   Barriers to discharge Decreased caregiver support      Co-evaluation               End of Session Equipment Utilized During Treatment: Gait belt;Oxygen Activity Tolerance: Patient tolerated treatment well Patient left: in bed;with call bell/phone within reach Nurse Communication: Mobility status PT Visit Diagnosis: Muscle weakness (generalized) (M62.81);Unsteadiness on feet (R26.81);Repeated falls (R29.6)    Functional Assessment Tool Used: AM-PAC 6 Clicks Basic Mobility;Clinical judgement Functional Limitation: Mobility: Walking and moving around Mobility: Walking and Moving Around Current Status ([id number]([id number]): At least 1 percent but less than 20 percent impaired, limited or restricted Mobility: Walking and Moving Around Goal Status [phone](G8979): 0 percent impaired, limited or restricted    Time: [id number] PT Time Calculation (min) (ACUTE ONLY): 20 min   Charges:   PT Evaluation $PT Eval Moderate Complexity: 1 Procedure     PT G Codes:   PT G-Codes **NOT FOR INPATIENT CLASS** Functional Assessment Tool Used:  AM-PAC 6 Clicks Basic Mobility;Clinical judgement Functional Limitation: Mobility: Walking and moving around Mobility: Walking and Moving Around Current Status ([id number](G8978): At least 1 percent but less than 20 percent impaired, limited or restricted Mobility: Walking and Moving Around Goal Status [phone](G8979): 0 percent impaired, limited or restricted     [facility]enn[facility] M Jibri Schriefer 09/10/2016, 9:28 AM Deborah ChalkJennifer Taydon Nasworthy, PT, DPT [phone]

## 2016-09-10 NOTE — Progress Notes (Signed)
09/10/2016 1320 Discharge AVS meds taken today and those due this evening reviewed.  Follow-up appointments and when to call md reviewed.  D/C IV and TELE.  Questions and concerns addressed.   D/C home per orders. Kathryne HitchAllen, Miller Limehouse C

## 2016-09-10 NOTE — Telephone Encounter (Signed)
Sched appt 03/14/17; lab at 12:00 and NP at 1:15. Removed carotid from 11/22/16 appt.

## 2016-09-13 ENCOUNTER — Inpatient Hospital Stay
Admission: EM | Admit: 2016-09-13 | Discharge: 2016-09-16 | DRG: 690 | Disposition: A | Discharge status: Home / Self Care | Payer: Medicare Other | Attending: Internal Medicine | Admitting: Internal Medicine

## 2016-09-13 ENCOUNTER — Encounter: Payer: Self-pay | Admitting: Emergency Medicine

## 2016-09-13 DIAGNOSIS — M4802 Spinal stenosis, cervical region: Secondary | ICD-10-CM

## 2016-09-13 DIAGNOSIS — Z8673 Personal history of transient ischemic attack (TIA), and cerebral infarction without residual deficits: Secondary | ICD-10-CM | POA: Diagnosis not present

## 2016-09-13 DIAGNOSIS — K219 Gastro-esophageal reflux disease without esophagitis: Secondary | ICD-10-CM | POA: Diagnosis present

## 2016-09-13 DIAGNOSIS — Z880 Allergy status to penicillin: Secondary | ICD-10-CM

## 2016-09-13 DIAGNOSIS — Z7982 Long term (current) use of aspirin: Secondary | ICD-10-CM

## 2016-09-13 DIAGNOSIS — Z7951 Long term (current) use of inhaled steroids: Secondary | ICD-10-CM | POA: Diagnosis not present

## 2016-09-13 DIAGNOSIS — I110 Hypertensive heart disease with heart failure: Secondary | ICD-10-CM | POA: Diagnosis present

## 2016-09-13 DIAGNOSIS — Z8249 Family history of ischemic heart disease and other diseases of the circulatory system: Secondary | ICD-10-CM

## 2016-09-13 DIAGNOSIS — Z7952 Long term (current) use of systemic steroids: Secondary | ICD-10-CM

## 2016-09-13 DIAGNOSIS — E039 Hypothyroidism, unspecified: Secondary | ICD-10-CM | POA: Diagnosis present

## 2016-09-13 DIAGNOSIS — Z9181 History of falling: Secondary | ICD-10-CM | POA: Diagnosis not present

## 2016-09-13 DIAGNOSIS — R531 Weakness: Secondary | ICD-10-CM

## 2016-09-13 DIAGNOSIS — Z800 Family history of malignant neoplasm of digestive organs: Secondary | ICD-10-CM | POA: Diagnosis not present

## 2016-09-13 DIAGNOSIS — I5032 Chronic diastolic (congestive) heart failure: Secondary | ICD-10-CM | POA: Diagnosis present

## 2016-09-13 DIAGNOSIS — L8990 Pressure ulcer of unspecified site, unspecified stage: Secondary | ICD-10-CM | POA: Insufficient documentation

## 2016-09-13 DIAGNOSIS — G629 Polyneuropathy, unspecified: Secondary | ICD-10-CM | POA: Diagnosis present

## 2016-09-13 DIAGNOSIS — R55 Syncope and collapse: Secondary | ICD-10-CM | POA: Diagnosis present

## 2016-09-13 DIAGNOSIS — Z981 Arthrodesis status: Secondary | ICD-10-CM | POA: Diagnosis not present

## 2016-09-13 DIAGNOSIS — B9689 Other specified bacterial agents as the cause of diseases classified elsewhere: Secondary | ICD-10-CM | POA: Diagnosis present

## 2016-09-13 DIAGNOSIS — Z9981 Dependence on supplemental oxygen: Secondary | ICD-10-CM

## 2016-09-13 DIAGNOSIS — I6521 Occlusion and stenosis of right carotid artery: Secondary | ICD-10-CM | POA: Diagnosis present

## 2016-09-13 DIAGNOSIS — R296 Repeated falls: Secondary | ICD-10-CM | POA: Diagnosis present

## 2016-09-13 DIAGNOSIS — Z888 Allergy status to other drugs, medicaments and biological substances status: Secondary | ICD-10-CM | POA: Diagnosis not present

## 2016-09-13 DIAGNOSIS — N390 Urinary tract infection, site not specified: Secondary | ICD-10-CM | POA: Diagnosis present

## 2016-09-13 DIAGNOSIS — G8929 Other chronic pain: Secondary | ICD-10-CM | POA: Diagnosis present

## 2016-09-13 DIAGNOSIS — M545 Low back pain: Secondary | ICD-10-CM | POA: Diagnosis present

## 2016-09-13 DIAGNOSIS — F329 Major depressive disorder, single episode, unspecified: Secondary | ICD-10-CM | POA: Diagnosis present

## 2016-09-13 DIAGNOSIS — J449 Chronic obstructive pulmonary disease, unspecified: Secondary | ICD-10-CM | POA: Diagnosis present

## 2016-09-13 DIAGNOSIS — Z87891 Personal history of nicotine dependence: Secondary | ICD-10-CM

## 2016-09-13 DIAGNOSIS — Z8679 Personal history of other diseases of the circulatory system: Secondary | ICD-10-CM

## 2016-09-13 DIAGNOSIS — I10 Essential (primary) hypertension: Secondary | ICD-10-CM | POA: Diagnosis present

## 2016-09-13 DIAGNOSIS — M542 Cervicalgia: Secondary | ICD-10-CM | POA: Diagnosis present

## 2016-09-13 LAB — CBC
HEMATOCRIT: 33.3 % — AB (ref 40.0–52.0)
Hemoglobin: 11.1 g/dL — ABNORMAL LOW (ref 13.0–18.0)
MCH: 30.4 pg (ref 26.0–34.0)
MCHC: 33.2 g/dL (ref 32.0–36.0)
MCV: 91.6 fL (ref 80.0–100.0)
Platelets: 145 10*3/uL — ABNORMAL LOW (ref 150–440)
RBC: 3.63 MIL/uL — ABNORMAL LOW (ref 4.40–5.90)
RDW: 17.3 % — AB (ref 11.5–14.5)
WBC: 5.7 10*3/uL (ref 3.8–10.6)

## 2016-09-13 LAB — URINALYSIS, COMPLETE (UACMP) WITH MICROSCOPIC
Bilirubin Urine: NEGATIVE
GLUCOSE, UA: NEGATIVE mg/dL
Ketones, ur: NEGATIVE mg/dL
Nitrite: NEGATIVE
Protein, ur: 30 mg/dL — AB
SQUAMOUS EPITHELIAL / LPF: NONE SEEN
Specific Gravity, Urine: 1.017 (ref 1.005–1.030)
pH: 6 (ref 5.0–8.0)

## 2016-09-13 LAB — BASIC METABOLIC PANEL
Anion gap: 6 (ref 5–15)
BUN: 16 mg/dL (ref 6–20)
CHLORIDE: 96 mmol/L — AB (ref 101–111)
CO2: 34 mmol/L — AB (ref 22–32)
Calcium: 8.8 mg/dL — ABNORMAL LOW (ref 8.9–10.3)
Creatinine, Ser: 1.08 mg/dL (ref 0.61–1.24)
GFR calc Af Amer: >60 mL/min (ref >60)
GFR calc non Af Amer: >60 mL/min (ref >60)
Glucose, Bld: 80 mg/dL (ref 65–99)
POTASSIUM: 3.8 mmol/L (ref 3.5–5.1)
SODIUM: 136 mmol/L (ref 135–145)

## 2016-09-13 LAB — TROPONIN I

## 2016-09-13 MED ORDER — METOPROLOL TARTRATE 25 MG PO TABS
25.0000 mg | ORAL_TABLET | Freq: Two times a day (BID) | ORAL | Status: DC
  Administered 2016-09-14 – 2016-09-15 (×3): 25 mg via ORAL
  Filled 2016-09-13 (×4): qty 1

## 2016-09-13 MED ORDER — ACETAMINOPHEN 325 MG PO TABS: 650.0000 mg | ORAL_TABLET | Freq: Four times a day (QID) | ORAL | Status: DC | PRN

## 2016-09-13 MED ORDER — TIOTROPIUM BROMIDE MONOHYDRATE 18 MCG IN CAPS
18.0000 ug | ORAL_CAPSULE | Freq: Every day | RESPIRATORY_TRACT | Status: DC
  Administered 2016-09-14 – 2016-09-16 (×3): 18 ug via RESPIRATORY_TRACT
  Filled 2016-09-13: qty 5

## 2016-09-13 MED ORDER — CEFTRIAXONE SODIUM-DEXTROSE 1-3.74 GM-% IV SOLR
1.0000 g | Freq: Once | INTRAVENOUS | Status: AC
  Administered 2016-09-13: 1 g via INTRAVENOUS
  Filled 2016-09-13: qty 50

## 2016-09-13 MED ORDER — ONDANSETRON HCL 4 MG/2ML IJ SOLN: 4.0000 mg | Freq: Four times a day (QID) | INTRAMUSCULAR | Status: DC | PRN

## 2016-09-13 MED ORDER — CEFTRIAXONE SODIUM-DEXTROSE 1-3.74 GM-% IV SOLR
1.0000 g | INTRAVENOUS | Status: DC
  Filled 2016-09-13: qty 50

## 2016-09-13 MED ORDER — OXYCODONE HCL 5 MG PO TABS
ORAL_TABLET | ORAL | Status: AC
  Administered 2016-09-13: 5 mg via ORAL
  Filled 2016-09-13: qty 1

## 2016-09-13 MED ORDER — DEXTROSE 5 % IV SOLN: 1.0000 g | Freq: Once | INTRAVENOUS | Status: DC

## 2016-09-13 MED ORDER — ASPIRIN EC 81 MG PO TBEC
81.0000 mg | DELAYED_RELEASE_TABLET | Freq: Every day | ORAL | Status: DC
  Administered 2016-09-14 – 2016-09-16 (×3): 81 mg via ORAL
  Filled 2016-09-13 (×3): qty 1

## 2016-09-13 MED ORDER — MOMETASONE FURO-FORMOTEROL FUM 200-5 MCG/ACT IN AERO
2.0000 | INHALATION_SPRAY | Freq: Two times a day (BID) | RESPIRATORY_TRACT | Status: DC
  Administered 2016-09-14 – 2016-09-16 (×6): 2 via RESPIRATORY_TRACT
  Filled 2016-09-13: qty 8.8

## 2016-09-13 MED ORDER — TORSEMIDE 20 MG PO TABS
20.0000 mg | ORAL_TABLET | Freq: Every day | ORAL | Status: DC
  Administered 2016-09-14 – 2016-09-16 (×3): 20 mg via ORAL
  Filled 2016-09-13 (×3): qty 1

## 2016-09-13 MED ORDER — OXYCODONE HCL 5 MG PO TABS
5.0000 mg | ORAL_TABLET | Freq: Three times a day (TID) | ORAL | Status: DC | PRN
  Administered 2016-09-13 – 2016-09-14 (×2): 5 mg via ORAL
  Filled 2016-09-13: qty 1

## 2016-09-13 MED ORDER — CLONAZEPAM 0.5 MG PO TABS
0.5000 mg | ORAL_TABLET | Freq: Two times a day (BID) | ORAL | Status: DC
  Administered 2016-09-14 – 2016-09-16 (×6): 0.5 mg via ORAL
  Filled 2016-09-13 (×6): qty 1

## 2016-09-13 MED ORDER — CEPHALEXIN 500 MG PO CAPS: 500.0000 mg | ORAL_CAPSULE | Freq: Three times a day (TID) | ORAL | 0 refills | Status: DC

## 2016-09-13 MED ORDER — SERTRALINE HCL 50 MG PO TABS
75.0000 mg | ORAL_TABLET | Freq: Every day | ORAL | Status: DC
  Administered 2016-09-14 – 2016-09-16 (×3): 75 mg via ORAL
  Filled 2016-09-13 (×3): qty 1

## 2016-09-13 MED ORDER — QUETIAPINE FUMARATE 25 MG PO TABS
50.0000 mg | ORAL_TABLET | Freq: Every day | ORAL | Status: DC
  Administered 2016-09-14 – 2016-09-15 (×3): 50 mg via ORAL
  Filled 2016-09-13 (×3): qty 2

## 2016-09-13 MED ORDER — PREDNISONE 5 MG PO TABS
5.0000 mg | ORAL_TABLET | Freq: Every day | ORAL | Status: DC
  Administered 2016-09-14 – 2016-09-16 (×3): 5 mg via ORAL
  Filled 2016-09-13 (×3): qty 1

## 2016-09-13 MED ORDER — ACETAMINOPHEN 650 MG RE SUPP
650.0000 mg | Freq: Four times a day (QID) | RECTAL | Status: DC | PRN
Start: 2016-09-13 — End: 2016-09-16

## 2016-09-13 MED ORDER — ENOXAPARIN SODIUM 40 MG/0.4ML ~~LOC~~ SOLN
40.0000 mg | SUBCUTANEOUS | Status: DC
  Administered 2016-09-14 – 2016-09-15 (×3): 40 mg via SUBCUTANEOUS
  Filled 2016-09-13 (×3): qty 0.4

## 2016-09-13 MED ORDER — METOCLOPRAMIDE HCL 5 MG PO TABS
2.5000 mg | ORAL_TABLET | Freq: Four times a day (QID) | ORAL | Status: DC
  Administered 2016-09-14 (×4): 2.5 mg via ORAL
  Administered 2016-09-15: 10:00:00 5 mg via ORAL
  Administered 2016-09-15 – 2016-09-16 (×5): 2.5 mg via ORAL
  Filled 2016-09-13 (×10): qty 1

## 2016-09-13 MED ORDER — ONDANSETRON HCL 4 MG PO TABS: 4.0000 mg | ORAL_TABLET | Freq: Four times a day (QID) | ORAL | Status: DC | PRN

## 2016-09-13 MED ORDER — LISINOPRIL 20 MG PO TABS
20.0000 mg | ORAL_TABLET | Freq: Two times a day (BID) | ORAL | Status: DC
  Administered 2016-09-14 (×2): 20 mg via ORAL
  Filled 2016-09-13 (×2): qty 1

## 2016-09-13 NOTE — ED Notes (Signed)
Pt [facility] for "sore on bottom." Pt requested "salve to put on it." Pt was given Moisture Barrier Ointment which pt states "bottom feels better" after slave was applied.

## 2016-09-13 NOTE — H&P (Signed)
[facility] [place] at [facility]   PATIENT NAME: Chad Huffman Geier    MR#:  [id number]  DATE OF BIRTH:  Jan 26, 1946  DATE OF ADMISSION:  09/13/2016  PRIMARY CARE PHYSICIAN: Dorothey BasemanAVID BRONSTEIN, MD   REQUESTING/REFERRING PHYSICIAN: Derrill KayGoodman, MD  CHIEF COMPLAINT:   Chief Complaint  Patient presents with  . Loss of Consciousness    HISTORY OF PRESENT ILLNESS:  Chad Huffman [place]  is a 71 y.o. male who presents with Falls, syncopal episode, weakness, urinary symptoms. Patient's workup here in the ED seems to indicate UTI. Workup in the ED was otherwise largely within normal limits. Hospitalists were called for admission and further treatment.  PAST MEDICAL HISTORY:   Past Medical History:  Diagnosis Date  . Antalgic gait   . Carotid artery occlusion   . Chronic neck and back pain   . Complication of anesthesia    pt has had cervical fusion-limited neck flextion  . COPD (chronic obstructive pulmonary disease) (HCC)   . Depression   . DJD (degenerative joint disease)    Right shoulder  . Hypertension   . Lumbago   . Peripheral neuropathy (HCC)     PAST SURGICAL HISTORY:   Past Surgical History:  Procedure Laterality Date  . ABDOMINAL AORTIC ANEURYSM REPAIR  2009   Endovascular AAA repair  . ABDOMINAL AORTIC ANEURYSM REPAIR    . CAROTID ANGIOGRAPHY  09/09/2016   Procedure: Carotid Angiography;  Surgeon: Nada LibmanVance W Brabham, MD;  [facility];  Service: Cardiovascular;;  rt. carotid  . CARPAL TUNNEL RELEASE     bilateral CTS  . HAMMER TOE SURGERY  12/10/2011   Procedure: HAMMER TOE CORRECTION;  Surgeon: Ernestene KielMax Hyatt, DPM;  Location: [facility];  Service: Podiatry;  Laterality: Right;  Right hammertoe repair second right with 2.5x42 orthopro screw   . IR GENERIC HISTORICAL  04/05/2016   IR GASTROSTOMY TUBE MOD SED 04/05/2016 [facility]  . IR GENERIC HISTORICAL  05/12/2016   IR CM INJ ANY COLONIC TUBE W/FLUORO 05/12/2016 Malachy MoanHeath  McCullough, MD [facility]  . IR GENERIC HISTORICAL  05/26/2016   IR CM INJ ANY COLONIC TUBE W/FLUORO 05/26/2016 [facility]  . IR GENERIC HISTORICAL  05/28/2016   IR REPLC GASTRO/COLONIC TUBE PERCUT W/FLUORO 05/28/2016 Brayton ElKevin Bruning, PA-C [facility]  . IR GENERIC HISTORICAL  07/02/2016   IR GASTROSTOMY TUBE REMOVAL 07/02/2016 Brayton ElKevin Bruning, PA-C [facility]  . METATARSAL OSTEOTOMY  12/10/2011   Procedure: METATARSAL OSTEOTOMY;  Surgeon: Ernestene KielMax Hyatt, DPM;  Location: [facility];  Service: Podiatry;  Laterality: Right;  Right Keller arthroplasty with size 5 silicone implant right great toe; second metatarsal osteotomy with 2.0x14 screw  . NECK SURGERY    . PERIPHERAL VASCULAR CATHETERIZATION N/A 02/11/2015   Procedure: Carotid Angiography;  Surgeon: Nada LibmanVance W Brabham, MD;  [facility];  Service: Cardiovascular;  Laterality: N/A;  . PILONIDAL CYST / SINUS EXCISION    . Rotary cuff    . SPINE SURGERY  09/15/11   Scar tissue removed- back  . SPINE SURGERY  09/15/11   Scar tissue removed- back  . TONSILLECTOMY    . TRACHEOSTOMY TUBE PLACEMENT N/A 03/03/2016   Procedure: TRACHEOSTOMY;  Surgeon: Geanie LoganPaul Bennett, MD;  Location: [facility] ORS;  Service: ENT;  Laterality: N/A;    SOCIAL HISTORY:   Social History  Substance Use Topics  . Smoking status: Former Smoker    Quit date: 08/30/2006  . Smokeless tobacco: Never Used  .  Alcohol use 1.2 oz/week    2 Cans of beer per week     Comment: occasional use    FAMILY HISTORY:   Family History  Problem Relation Age of Onset  . Stomach cancer Mother   . Hypertension Brother   . Heart attack Father     DRUG ALLERGIES:   Allergies  Allergen Reactions  . Ampicillin Swelling and Other (See Comments)    Unable to obtain enough information to answer additional questions about this medication.  Has patient had a PCN reaction causing immediate rash, facial/tongue/throat swelling, SOB or lightheadedness with hypotension:  Yes Has patient had a PCN reaction causing severe rash involving mucus membranes or skin necrosis: No Has patient had a PCN reaction that required hospitalization No Has patient had a PCN reaction occurring within the last 10 years: Yes If all of the above answers are "NO", then may pr  . Nortriptyline Other (See Comments)    Sleep walking  . Brimonidine Other (See Comments)    Unknown reaction per MAR   . Cyclobenzaprine Other (See Comments)    Hallucinations  . Tizanidine Other (See Comments)    Hallucinations  . Gabapentin Rash    MEDICATIONS AT HOME:   Prior to Admission medications   Medication Sig Start Date End Date Taking? Authorizing Provider  aspirin EC 325 MG tablet Take 1 tablet (325 mg total) by mouth daily. 08/23/16  Yes Katharina Caper, MD  aspirin EC 81 MG tablet Take 81 mg by mouth daily.   Yes Historical Provider, MD  folic acid (FOLVITE) 1 MG tablet Take 1 mg by mouth daily.    Yes Historical Provider, MD  lisinopril (PRINIVIL,ZESTRIL) 20 MG tablet Take 1 tablet (20 mg total) by mouth 2 (two) times daily. 07/27/16  Yes Katharina Caper, MD  metoprolol tartrate (LOPRESSOR) 25 MG tablet Take 1 tablet (25 mg total) by mouth 2 (two) times daily. 07/27/16  Yes Katharina Caper, MD  Multiple Vitamin (MULTIVITAMIN WITH MINERALS) TABS tablet Take 1 tablet by mouth daily.   Yes Historical Provider, MD  QUEtiapine (SEROQUEL) 50 MG tablet Take 50 mg by mouth at bedtime.   Yes Historical Provider, MD  sertraline (ZOLOFT) 50 MG tablet Take 75 mg by mouth daily.    Yes Historical Provider, MD  torsemide (DEMADEX) 20 MG tablet Take 20 mg by mouth daily.    Yes Historical Provider, MD  albuterol (ACCUNEB) 1.25 MG/3ML nebulizer solution Take 1 ampule by nebulization every 4 (four) hours as needed for wheezing.    Historical Provider, MD  budesonide (PULMICORT) 0.5 MG/2ML nebulizer solution Take 0.5 mg by nebulization 2 (two) times daily.    Historical Provider, MD  cephALEXin (KEFLEX) 500 MG  capsule Take 1 capsule (500 mg total) by mouth 3 (three) times daily. 09/13/16   Phineas Semen, MD  clonazePAM (KLONOPIN) 0.5 MG tablet Take 1 tablet (0.5 mg total) by mouth 2 (two) times daily. 08/23/16   Katharina Caper, MD  metoCLOPramide (REGLAN) 5 MG tablet Take 2.5 mg by mouth every 6 (six) hours.     Historical Provider, MD  oxyCODONE (OXY IR/ROXICODONE) 5 MG immediate release tablet Take 1 tablet (5 mg total) by mouth every 8 (eight) hours. 08/23/16   Katharina Caper, MD  OXYGEN 4 L/min by Intratracheal route as needed (for shortness of breath).     Historical Provider, MD  predniSONE (DELTASONE) 5 MG tablet Take 1 tablet by mouth daily. 07/06/16   Historical Provider, MD  predniSONE (STERAPRED Carlus Pavlov  21 TAB) 10 MG (21) TBPK tablet Please take 6 pills in the morning on the day 1 and 2, then taper by one pill every 2 days until finished, thank you 08/23/16   Katharina Caper, MD  SYMBICORT 160-4.5 MCG/ACT inhaler Inhale 2 puffs into the lungs 2 (two) times daily. 08/04/16   Historical Provider, MD  tiotropium (SPIRIVA) 18 MCG inhalation capsule Place 1 capsule (18 mcg total) into inhaler and inhale daily. 07/28/16   Katharina Caper, MD    REVIEW OF SYSTEMS:  Review of Systems  Constitutional: Negative for chills, fever, malaise/fatigue and weight loss.  HENT: Negative for ear pain, hearing loss and tinnitus.   Eyes: Negative for blurred vision, double vision, pain and redness.  Respiratory: Negative for cough, hemoptysis and shortness of breath.   Cardiovascular: Negative for chest pain, palpitations, orthopnea and leg swelling.  Gastrointestinal: Negative for abdominal pain, constipation, diarrhea, nausea and vomiting.  Genitourinary: Positive for frequency. Negative for dysuria and hematuria.  Musculoskeletal: Positive for falls. Negative for back pain, joint pain and neck pain.  Skin:       No acne, rash, or lesions  Neurological: Positive for weakness. Negative for dizziness, tremors and focal  weakness.  Endo/Heme/Allergies: Negative for polydipsia. Does not bruise/bleed easily.  Psychiatric/Behavioral: Negative for depression. The patient is not nervous/anxious and does not have insomnia.      VITAL SIGNS:   Vitals:   09/13/16 1700 09/13/16 1800 09/13/16 1830 09/13/16 2130  BP: (!) 162/74 133/77 131/70 (!) 148/83  Pulse:  78 80 81  Resp: 14 14 14  (!) 27  Temp:      TempSrc:      SpO2:  97% 97% 99%  Weight:      Height:       Wt Readings from Last 3 Encounters:  09/13/16 94.3 kg (208 lb)  09/09/16 94.3 kg (208 lb)  08/30/16 94.8 kg (209 lb)    PHYSICAL EXAMINATION:  Physical Exam  LABORATORY PANEL:   CBC  Recent Labs Lab 09/13/16 1407  WBC 5.7  HGB 11.1*  HCT 33.3*  PLT 145*   ------------------------------------------------------------------------------------------------------------------  Chemistries   Recent Labs Lab 09/13/16 1407  NA 136  K 3.8  CL 96*  CO2 34*  GLUCOSE 80  BUN 16  CREATININE 1.08  CALCIUM 8.8*   ------------------------------------------------------------------------------------------------------------------  Cardiac Enzymes  Recent Labs Lab 09/13/16 1407  TROPONINI <0.03   ------------------------------------------------------------------------------------------------------------------  RADIOLOGY:  No results found.  EKG:   Orders placed or performed during the hospital encounter of 09/13/16  . ED EKG  . ED EKG  . EKG 12-Lead  . EKG 12-Lead    IMPRESSION AND PLAN:  Principal Problem:   UTI (urinary tract infection) - IV antibiotics started, urine culture sent Active Problems:   Weakness - due to his UTI, expect improvement with treatment of the same   Essential hypertension - continue home meds   Chronic diastolic CHF (congestive heart failure) (HCC) - stable, continue home meds   COPD (chronic obstructive pulmonary disease) (HCC) - not an exacerbation, continue home inhalers   Hypothyroidism -  home dose thyroid replacement   GERD without esophagitis - home dose PPI  All the records are reviewed and case discussed with ED provider. Management plans discussed with the patient and/or family.  DVT PROPHYLAXIS: SubQ lovenox  GI PROPHYLAXIS: PPI  ADMISSION STATUS: Inpatient  CODE STATUS: Full Code Status History    Date Active Date Inactive Code Status Order ID Comments User Context  09/09/2016  9:11 AM 09/10/2016  4:32 PM Full Code [id number]  Nada Libman, MD Inpatient   08/20/2016  2:06 AM 08/23/2016  9:38 PM Full Code [id number]  Tonye Royalty, DO Inpatient   07/16/2016  1:12 PM 07/27/2016  8:18 PM Full Code [id number]  Erin Fulling, MD ED   03/09/2016  5:20 PM 03/15/2016 10:06 PM Full Code [id number]  Heide Spark Inpatient   03/03/2016  6:09 PM 03/09/2016  4:04 PM Full Code [id number]  Erin Fulling, MD ED   02/27/2016 10:41 PM 02/29/2016  7:53 PM Full Code [id number]  Oralia Manis, MD Inpatient   07/22/2015  7:30 PM 07/26/2015 12:16 AM Full Code [id number]  Adrian Saran, MD Inpatient   02/11/2015 10:28 AM 02/11/2015  6:15 PM Full Code [id number]  Nada Libman, MD Inpatient   08/04/2014  8:13 PM 08/13/2014  2:53 PM Full Code [id number]  Lynden Oxford, MD Inpatient    Advance Directive Documentation     Most Recent Value  Type of Advance Directive  Living will  Pre-existing out of facility DNR order (yellow form or pink MOST form)  -  "MOST" Form in Place?  -      TOTAL TIME TAKING CARE OF THIS PATIENT: 45 minutes.    Safir Michalec FIELDING 09/13/2016, 9:45 PM  [employer] [facility]  Office  [phone]  CC: Primary care physician; Dorothey Baseman, MD

## 2016-09-13 NOTE — ED Triage Notes (Signed)
Says he has syncopal episodes since about last august.  Says they are not related to anything, he just passes out.  Says he has gradually gotten weaker.  Cant stand as long.  Says has tingling in extremities.  Says hx back surgery.  Pt in nad.  Just discharged from hospital.

## 2016-09-13 NOTE — ED Notes (Signed)
Patient denies any falls or syncopal episode in the past few days.  Patient states, "I can't go home, I don't feel comfortable."  Patient states he hasn't been able to bathe or properly care for himself.  MD notified.

## 2016-09-13 NOTE — ED Notes (Signed)
Pt requested that the RN take a look at a bedsore he has and does not feel [facility] is treating it well. LM EDT

## 2016-09-13 NOTE — ED Provider Notes (Signed)
[facility] Emergency Department Provider Note   ____________________________________________   I have reviewed the triage vital signs and the nursing notes.   HISTORY  Chief Complaint Loss of Consciousness   History limited by: Not Limited   HPI Chad Huffman is a 71 y.o. male who presents to the emergency department today because of concerns for continued syncopal episodes. The patient has had these syncopal episodes for at least the past few months. Has been evaluated and recently underwent a carotid angiogram. There is no intervention performed. Does not sound like anything new or different occurred today. He states he came in because he is having a hard time caring for himself. He did not try getting in touch with his primary care doctor today. Additionally states that he's been working with pulmonologist who knows his case well but again did not try to get in touch with him today.   Past Medical History:  Diagnosis Date  . Antalgic gait   . Carotid artery occlusion   . Chronic neck and back pain   . Complication of anesthesia    pt has had cervical fusion-limited neck flextion  . COPD (chronic obstructive pulmonary disease) (HCC)   . Depression   . DJD (degenerative joint disease)    Right shoulder  . Hypertension   . Lumbago   . Peripheral neuropathy [facility](HCC)     Patient Active Problem List   Diagnosis Date Noted  . Carotid stenosis 09/09/2016  . Carotid artery stenosis 08/23/2016  . Pancytopenia (HCC) 08/23/2016  . Hyperkalemia 08/23/2016  . Acute on chronic diastolic CHF (congestive heart failure) (HCC) 07/27/2016  . Leukocytosis 07/27/2016  . Thrombocytopenia (HCC) 07/27/2016  . Anemia 07/27/2016  . Acute on chronic respiratory failure with hypoxia and hypercapnia (HCC) 07/16/2016  . 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 Dysphagia, pharyngoesophageal phase 05/18/2016  . GERD without  esophagitis 05/09/2016  . Bilateral lower extremity edema 05/09/2016  . Slow transit constipation 05/09/2016  . Depression with anxiety 05/09/2016  . Respiratory failure with hypoxia and hypercapnia (HCC) 07/22/2015  . Cervical vertebral fusion   . Depression   . Chronic neck and back pain   . Acute on chronic respiratory failure with hypercapnia (HCC)   . COPD exacerbation (HCC)   . HCAP (healthcare-associated pneumonia)   . Congestive dilated cardiomyopathy (HCC)   . Syncope and collapse   . Arthritis of right hip 08/04/2014  . Essential hypertension 08/04/2014  . Hypothyroidism 08/04/2014  . Syncope 08/04/2014  . Fall 08/04/2014    Past Surgical History:  Procedure Laterality Date  . ABDOMINAL AORTIC ANEURYSM REPAIR  2009   Endovascular AAA repair  . ABDOMINAL AORTIC ANEURYSM REPAIR    . CAROTID ANGIOGRAPHY  09/09/2016   Procedure: Carotid Angiography;  Surgeon: Nada LibmanVance W Brabham, MD;  [facility];  Service: Cardiovascular;;  rt. carotid  . CARPAL TUNNEL RELEASE     bilateral CTS  . HAMMER TOE SURGERY  12/10/2011   Procedure: HAMMER TOE CORRECTION;  Surgeon: Ernestene KielMax Hyatt, DPM;  Location: [facility];  Service: Podiatry;  Laterality: Right;  Right hammertoe repair second right with 2.5x42 orthopro screw   . IR GENERIC HISTORICAL  04/05/2016   IR GASTROSTOMY TUBE MOD SED 04/05/2016 [facility]  . IR GENERIC HISTORICAL  05/12/2016   IR CM INJ ANY COLONIC TUBE W/FLUORO 05/12/2016 Malachy MoanHeath McCullough, MD [facility]  . IR GENERIC HISTORICAL  05/26/2016   IR  CM INJ ANY COLONIC TUBE W/FLUORO 05/26/2016 [facility]  . IR GENERIC HISTORICAL  05/28/2016   IR REPLC GASTRO/COLONIC TUBE PERCUT W/FLUORO 05/28/2016 Brayton El, PA-C [facility]  . IR GENERIC HISTORICAL  07/02/2016   IR GASTROSTOMY TUBE REMOVAL 07/02/2016 Brayton El, PA-C [facility]  . METATARSAL OSTEOTOMY  12/10/2011   Procedure: METATARSAL OSTEOTOMY;  Surgeon: Ernestene Kiel, DPM;  Location:  [facility];  Service: Podiatry;  Laterality: Right;  Right Keller arthroplasty with size 5 silicone implant right great toe; second metatarsal osteotomy with 2.0x14 screw  . NECK SURGERY    . PERIPHERAL VASCULAR CATHETERIZATION N/A 02/11/2015   Procedure: Carotid Angiography;  Surgeon: Nada Libman, MD;  Location: [facility] INVASIVE CV LAB;  Service: Cardiovascular;  Laterality: N/A;  . PILONIDAL CYST / SINUS EXCISION    . Rotary cuff    . SPINE SURGERY  09/15/11   Scar tissue removed- back  . SPINE SURGERY  09/15/11   Scar tissue removed- back  . TONSILLECTOMY    . TRACHEOSTOMY TUBE PLACEMENT N/A 03/03/2016   Procedure: TRACHEOSTOMY;  Surgeon: Geanie Logan, MD;  Location: [facility] ORS;  Service: ENT;  Laterality: N/A;    Prior to Admission medications   Medication Sig Start Date End Date Taking? Authorizing Provider  albuterol (ACCUNEB) 1.25 MG/3ML nebulizer solution Take 1 ampule by nebulization every 4 (four) hours as needed for wheezing.    Historical Provider, MD  aspirin EC 325 MG tablet Take 1 tablet (325 mg total) by mouth daily. 08/23/16   Katharina Caper, MD  budesonide (PULMICORT) 0.5 MG/2ML nebulizer solution Take 0.5 mg by nebulization 2 (two) times daily.    Historical Provider, MD  clonazePAM (KLONOPIN) 0.5 MG tablet Take 1 tablet (0.5 mg total) by mouth 2 (two) times daily. 08/23/16   Katharina Caper, MD  clopidogrel (PLAVIX) 75 MG tablet Take 1 tablet (75 mg total) by mouth daily. 08/30/16   Nada Libman, MD  docusate sodium (COLACE) 100 MG capsule Take 100 mg by mouth 2 (two) times daily.     Historical Provider, MD  enoxaparin (LOVENOX) 40 MG/0.4ML injection Inject 40 mg into the skin daily. 08/11/16   Historical Provider, MD  folic acid (FOLVITE) 1 MG tablet Take 1 mg by mouth daily.     Historical Provider, MD  lisinopril (PRINIVIL,ZESTRIL) 20 MG tablet Take 1 tablet (20 mg total) by mouth 2 (two) times daily. 07/27/16   Katharina Caper, MD  metoCLOPramide (REGLAN) 5 MG tablet  Take 2.5 mg by mouth every 6 (six) hours.     Historical Provider, MD  metoprolol tartrate (LOPRESSOR) 25 MG tablet Take 1 tablet (25 mg total) by mouth 2 (two) times daily. 07/27/16   Katharina Caper, MD  Multiple Vitamin (MULTIVITAMIN WITH MINERALS) TABS tablet Take 1 tablet by mouth daily.    Historical Provider, MD  oxyCODONE (OXY IR/ROXICODONE) 5 MG immediate release tablet Take 1 tablet (5 mg total) by mouth every 8 (eight) hours. 08/23/16   Katharina Caper, MD  OXYGEN 4 L/min by Intratracheal route as needed (for shortness of breath).     Historical Provider, MD  predniSONE (DELTASONE) 5 MG tablet Take 1 tablet by mouth daily. 07/06/16   Historical Provider, MD  predniSONE (STERAPRED UNI-PAK 21 TAB) 10 MG (21) TBPK tablet Please take 6 pills in the morning on the day 1 and 2, then taper by one pill every 2 days until finished, thank you 08/23/16   Katharina Caper, MD  QUEtiapine (SEROQUEL) 50  MG tablet Take 50 mg by mouth at bedtime.    Historical Provider, MD  sertraline (ZOLOFT) 50 MG tablet Take 75 mg by mouth daily.     Historical Provider, MD  SYMBICORT 160-4.5 MCG/ACT inhaler Inhale 2 puffs into the lungs 2 (two) times daily. 08/04/16   Historical Provider, MD  tiotropium (SPIRIVA) 18 MCG inhalation capsule Place 1 capsule (18 mcg total) into inhaler and inhale daily. 07/28/16   Katharina Caper, MD  torsemide (DEMADEX) 20 MG tablet Take 20 mg by mouth daily.     Historical Provider, MD    Allergies Ampicillin; Nortriptyline; Brimonidine; Cyclobenzaprine; Tizanidine; and Gabapentin  Family History  Problem Relation Age of Onset  . Stomach cancer Mother   . Hypertension Brother   . Heart attack Father     Social History Social History  Substance Use Topics  . Smoking status: Former Smoker    Quit date: 08/30/2006  . Smokeless tobacco: Never Used  . Alcohol use 1.2 oz/week    2 Cans of beer per week     Comment: occasional use    Review of Systems  Constitutional: Negative for  fever. Cardiovascular: Negative for chest pain. Respiratory: Negative for shortness of breath. Gastrointestinal: Negative for abdominal pain, vomiting and diarrhea. Genitourinary: Negative for dysuria. Musculoskeletal: Positive for right neck pain. Neurological: Negative for headaches, focal weakness or numbness.  10-point ROS otherwise negative.  ____________________________________________   PHYSICAL EXAM:  VITAL SIGNS: ED Triage Vitals  Enc Vitals Group     BP 09/13/16 1400 115/70     Pulse Rate 09/13/16 1400 84     Resp 09/13/16 1400 16     Temp 09/13/16 1400 98.4 F (36.9 C)     Temp Source 09/13/16 1400 Oral     SpO2 09/13/16 1400 92 %     Weight 09/13/16 1355 208 lb (94.3 kg)     Height 09/13/16 1355 5\' 8"  (1.727 m)     Head Circumference --      Peak Flow --      Pain Score 09/13/16 1354 5   Constitutional: Alert and oriented. Well appearing and in no distress. Eyes: Conjunctivae are normal. Normal extraocular movements. ENT   Head: Normocephalic and atraumatic.   Nose: No congestion/rhinnorhea.   Mouth/Throat: Mucous membranes are moist.   Neck: No stridor. Hematological/Lymphatic/Immunilogical: No cervical lymphadenopathy. Cardiovascular: Normal rate, regular rhythm.  No murmurs, rubs, or gallops. Respiratory: Normal respiratory effort without tachypnea nor retractions. Breath sounds are clear and equal bilaterally. No wheezes/rales/rhonchi. Gastrointestinal: Soft and non tender. No rebound. No guarding.  Genitourinary: Deferred Musculoskeletal: Normal range of motion in all extremities. No lower extremity edema. Neurologic:  Normal speech and language. No gross focal neurologic deficits are appreciated.  Skin:  Skin is warm, dry and intact. No rash noted. Psychiatric: Mood and affect are normal. Speech and behavior are normal. Patient exhibits appropriate insight and judgment.  ____________________________________________    LABS (pertinent  positives/negatives)  WBC 5.7 Hgb 11.1 Plt 145 UA WBC too many to count WBC clumps present  ____________________________________________   EKG  I, Phineas Semen, attending physician, personally viewed and interpreted this EKG  EKG Time: 1356 Rate: 87 Rhythm: sinus rhythm with 1st degree av block Axis: normal Intervals: qtc 421 QRS: incomplete RBBB ST changes: no st elevation Impression: abnormal ekg   ____________________________________________    RADIOLOGY  None  ____________________________________________   PROCEDURES  Procedures  ____________________________________________   INITIAL IMPRESSION / ASSESSMENT AND PLAN / ED COURSE  Pertinent labs & imaging results that were available during my care of the patient were reviewed by me and considered in my medical decision making (see chart for details).  Patient presented to the emergency department today with continued worsening weakness. Urine is concerning for UTI. Patient was given IV antibiotics here. Will plan on admission to the hospital.   ____________________________________________   FINAL CLINICAL IMPRESSION(S) / ED DIAGNOSES  Final diagnoses:  Lower urinary tract infection  Lower urinary tract infectious disease     Note: This dictation was prepared with Dragon dictation. Any transcriptional errors that result from this process are unintentional     Phineas Semen, MD 09/14/16 1440

## 2016-09-13 NOTE — Progress Notes (Signed)
Pharmacy Antibiotic Note  Chad Huffman is a 71 y.o. male admitted on 09/13/2016 with UTI.  Pharmacy has been consulted for Ceftriaxone dosing.  Plan: Ceftriaxone 1g IV daily   Will monitor resolution of signs/symptoms of infection.  Height: 5\' 8"  (172.7 cm) Weight: 208 lb (94.3 kg) IBW/kg (Calculated) : 68.4  Temp (24hrs), Avg:98.4 F (36.9 C), Min:98.4 F (36.9 C), Max:98.4 F (36.9 C)   Recent Labs Lab 09/09/16 0704 09/10/16 0206 09/13/16 1407  WBC  --  5.6 5.7  CREATININE 1.30* 1.31* 1.08    Estimated Creatinine Clearance: 71.9 mL/min (by C-G formula based on SCr of 1.08 mg/dL).    Allergies  Allergen Reactions  . Ampicillin Swelling and Other (See Comments)    Unable to obtain enough information to answer additional questions about this medication.  Has patient had a PCN reaction causing immediate rash, facial/tongue/throat swelling, SOB or lightheadedness with hypotension: Yes Has patient had a PCN reaction causing severe rash involving mucus membranes or skin necrosis: No Has patient had a PCN reaction that required hospitalization No Has patient had a PCN reaction occurring within the last 10 years: Yes If all of the above answers are "NO", then may pr  . Nortriptyline Other (See Comments)    Sleep walking  . Brimonidine Other (See Comments)    Unknown reaction per MAR   . Cyclobenzaprine Other (See Comments)    Hallucinations  . Tizanidine Other (See Comments)    Hallucinations  . Gabapentin Rash     Thank you for allowing pharmacy to be a part of this patient's care.  [name] 09/13/2016 11:25 PM

## 2016-09-14 ENCOUNTER — Telehealth: Payer: Self-pay

## 2016-09-14 DIAGNOSIS — L8990 Pressure ulcer of unspecified site, unspecified stage: Secondary | ICD-10-CM | POA: Insufficient documentation

## 2016-09-14 LAB — BASIC METABOLIC PANEL
Anion gap: 5 (ref 5–15)
BUN: 16 mg/dL (ref 6–20)
CHLORIDE: 97 mmol/L — AB (ref 101–111)
CO2: 35 mmol/L — AB (ref 22–32)
Calcium: 8.3 mg/dL — ABNORMAL LOW (ref 8.9–10.3)
Creatinine, Ser: 1.2 mg/dL (ref 0.61–1.24)
GFR calc Af Amer: >60 mL/min (ref >60)
GFR calc non Af Amer: 60 mL/min — ABNORMAL LOW (ref >60)
GLUCOSE: 102 mg/dL — AB (ref 65–99)
POTASSIUM: 3.8 mmol/L (ref 3.5–5.1)
Sodium: 137 mmol/L (ref 135–145)

## 2016-09-14 LAB — CBC
HCT: 28.2 % — ABNORMAL LOW (ref 40.0–52.0)
Hemoglobin: 9.2 g/dL — ABNORMAL LOW (ref 13.0–18.0)
MCH: 29.6 pg (ref 26.0–34.0)
MCHC: 32.5 g/dL (ref 32.0–36.0)
MCV: 91 fL (ref 80.0–100.0)
Platelets: 134 10*3/uL — ABNORMAL LOW (ref 150–440)
RBC: 3.09 MIL/uL — ABNORMAL LOW (ref 4.40–5.90)
RDW: 17.1 % — AB (ref 11.5–14.5)
WBC: 5.9 10*3/uL (ref 3.8–10.6)

## 2016-09-14 MED ORDER — ORAL CARE MOUTH RINSE
15.0000 mL | Freq: Two times a day (BID) | OROMUCOSAL | Status: DC
  Administered 2016-09-14 – 2016-09-16 (×5): 15 mL via OROMUCOSAL

## 2016-09-14 MED ORDER — DEXTROSE 5 % IV SOLN
1.0000 g | INTRAVENOUS | Status: DC
  Administered 2016-09-14 – 2016-09-15 (×2): 1 g via INTRAVENOUS
  Filled 2016-09-14 (×3): qty 10

## 2016-09-14 NOTE — Telephone Encounter (Signed)
Initial appointment with the CHF clinic made 09/29/2016 @ 9:00 am

## 2016-09-14 NOTE — Progress Notes (Addendum)
[facility] - [place] at [facility]   PATIENT NAME: Chad Huffman    MR#:  [id number]  DATE OF BIRTH:  [birth year]  SUBJECTIVE:  CHIEF COMPLAINT:   Chief Complaint  Patient presents with  . Loss of Consciousness   Has had recurrent falls. Had recent stay at rehabilitation but did not like it and left.  His carotid stenosis was recently investigated by vascular surgery [place] and decided against endarterectomy or stenting.  Has generalized weakness. An chronic right upper extremity weakness.  REVIEW OF SYSTEMS:    Review of Systems  Constitutional: Positive for malaise/fatigue. Negative for chills and fever.  HENT: Negative for sore throat.   Eyes: Negative for blurred vision, double vision and pain.  Respiratory: Negative for cough, hemoptysis, shortness of breath and wheezing.   Cardiovascular: Negative for chest pain, palpitations, orthopnea and leg swelling.  Gastrointestinal: Negative for abdominal pain, constipation, diarrhea, heartburn, nausea and vomiting.  Genitourinary: Negative for dysuria and hematuria.  Musculoskeletal: Positive for back pain and falls. Negative for joint pain.  Skin: Negative for rash.  Neurological: Positive for focal weakness, loss of consciousness and weakness. Negative for sensory change, speech change and headaches.  Endo/Heme/Allergies: Does not bruise/bleed easily.  Psychiatric/Behavioral: Negative for depression. The patient is not nervous/anxious.     DRUG ALLERGIES:   Allergies  Allergen Reactions  . Ampicillin Swelling and Other (See Comments)    Unable to obtain enough information to answer additional questions about this medication.  Has patient had a PCN reaction causing immediate rash, facial/tongue/throat swelling, SOB or lightheadedness with hypotension: Yes Has patient had a PCN reaction causing severe rash involving mucus membranes or skin necrosis: No Has patient had a PCN reaction that required  hospitalization No Has patient had a PCN reaction occurring within the last 10 years: Yes If all of the above answers are "NO", then may pr  . Nortriptyline Other (See Comments)    Sleep walking  . Brimonidine Other (See Comments)    Unknown reaction per MAR   . Cyclobenzaprine Other (See Comments)    Hallucinations  . Tizanidine Other (See Comments)    Hallucinations  . Gabapentin Rash    VITALS:  Blood pressure (!) 107/58, pulse 65, temperature 98.6 F (37 C), temperature source Oral, resp. rate 20, height 5\' 8"  (1.727 m), weight 91.5 kg (201 lb 12.8 oz), SpO2 95 %.  PHYSICAL EXAMINATION:   Physical Exam  GENERAL:  71 y.o.-year-old patient lying in the bed with no acute distress.  EYES: Pupils equal, round, reactive to light and accommodation. No scleral icterus. Extraocular muscles intact.  HEENT: Head atraumatic, normocephalic. Oropharynx and nasopharynx clear.  NECK:  Supple, no jugular venous distention. No thyroid enlargement, no tenderness.  LUNGS: Normal breath sounds bilaterally, no wheezing, rales, rhonchi. No use of accessory muscles of respiration.  CARDIOVASCULAR: S1, S2 normal. No murmurs, rubs, or gallops.  ABDOMEN: Soft, nontender, nondistended. Bowel sounds present. No organomegaly or mass.  EXTREMITIES: No cyanosis, clubbing or edema b/l.    NEUROLOGIC: Cranial nerves II through XII are intact. No focal Motor or sensory deficits b/l.  RUE chronic weaknesss PSYCHIATRIC: The patient is alert and oriented x 3.  SKIN: No obvious rash, lesion, or ulcer.   LABORATORY PANEL:   CBC  Recent Labs Lab 09/14/16 0357  WBC 5.9  HGB 9.2*  HCT 28.2*  PLT 134*   ------------------------------------------------------------------------------------------------------------------ Chemistries   Recent Labs Lab 09/14/16 0357  NA 137  K 3.8  CL 97*  CO2 35*  GLUCOSE 102*  BUN 16  CREATININE 1.20  CALCIUM 8.3*    ------------------------------------------------------------------------------------------------------------------  Cardiac Enzymes  Recent Labs Lab 09/13/16 1407  TROPONINI <0.03   ------------------------------------------------------------------------------------------------------------------  RADIOLOGY:  No results found.   ASSESSMENT AND PLAN:    * UTI (urinary tract infection) - IV antibiotics started, urine culture sent Further antibiotic changes as per culture results.   * Weakness - due to his UTI Physical therapy to see. May need rehabilitation. Or home health.    * Essential hypertension - continue home meds   * Chronic diastolic CHF (congestive heart failure) (HCC) - stable, continue home meds  *  COPD (chronic obstructive pulmonary disease) (HCC) - not an exacerbation, continue home inhalers   * Hypothyroidism Levothyroxine   * GERD without esophagitis - home dose PPI  All the records are reviewed and case discussed with Care Management/Social Workerr. Management plans discussed with the patient, family and they are in agreement.  CODE STATUS: FULL CODE  DVT Prophylaxis: SCDs  TOTAL TIME TAKING CARE OF THIS PATIENT: 30 minutes.   POSSIBLE D/C IN 1-2 DAYS, DEPENDING ON CLINICAL CONDITION.  Milagros LollSudini, Bracha Frankowski R M.D on 09/14/2016 at 1:34 PM  Between 7am to 6pm - Pager - [phone]  After 6pm go to [email or web address] - password [email or web address] [facility]  [facility]  Office  [phone]  CC: Primary care physician; Dorothey BasemanAVID BRONSTEIN, MD  Note: This dictation was prepared with Dragon dictation along with smaller phrase technology. Any transcriptional errors that result from this process are unintentional.

## 2016-09-14 NOTE — Evaluation (Addendum)
Physical Therapy Evaluation Patient Details Name: Chad Huffman MRN: [id number] DOB: 09-19-1945 Today's Date: 09/14/2016   History of Present Illness  Chad Huffman  is a 71 y.o. male who presents with falls, syncopal episode, weakness, and urinary symptoms. Patient's workup here in the ED seemed to indicate UTI. Workup in the ED was otherwise largely within normal limits. Hospitalists were called for admission and further treatment. Pt is now admitted for his UTI. He recently underwent carotid angiogram due to carotid stenosis. He was found to have 60% R carotid stenosis. No intervention performed. Pt currently frustrated regarding lack of diagnosis regarding his syncopal episodes  Clinical Impression  Pt admitted with above diagnosis. Pt currently with functional limitations due to the deficits listed below (see PT Problem List).  Pt demonstrates reasonably good mobility on this date. He does have some notable imbalance without UE support especially in narrow stance. He reports deconditioning compared to his baseline status. He would benefit from [facility] PT to work on conditioning and balance. Patient's main problem is related to repeated falls. He has fallen at least 11 times in the last 6 months. He reports that every fall is secondary to a syncopal episode. Unfortunately there is no way to plan for these falls unless pt were to remain in wheelchair at all times which is not practical. He is requesting a HH aid to assist with some ADLs such as bathing and dressing. Currently he has a friend who infrequently assists with these needs. Pt will benefit from skilled PT services to address deficits in strength, balance, and mobility in order to return to full function at home.     Follow Up Recommendations Home health PT;Supervision/Assistance - 24 hour;Other (comment) (Pt would like a HH aid to assist with ADLs)    Equipment Recommendations  None recommended by PT;Other (comment) (Should use walker at  discharge)    Recommendations for Other Services       Precautions / Restrictions Precautions Precautions: Fall Restrictions Weight Bearing Restrictions: No      Mobility  Bed Mobility               General bed mobility comments: Received upright in recliner and left in recliner  Transfers Overall transfer level: Needs assistance Equipment used: Rolling walker (2 wheeled) Transfers: Sit to/from Stand Sit to Stand: Supervision         General transfer comment: Good technique and safety demonstrated with transfers to/from commode and recliner. No dizziness or lightheadedness reported during transfers  Ambulation/Gait Ambulation/Gait assistance: Min guard Ambulation Distance (Feet): 200 Feet Assistive device: Rolling walker (2 wheeled) Gait Pattern/deviations: Step-through pattern;Decreased step length - right;Decreased step length - left;Decreased stride length Gait velocity: Decreased but functional for limited community mobility   General Gait Details: Pt able to ambulate a full lap around RN station with therapist. Chair follow due to history of frequent syncope. No need for external assistance for all ambulation with rolling walker. Attemtped to obtain vitals but pulse oximeter will not read. HR palpated and appears to be <100 bpm. Pt denies fatigue or DOE. No signs of respiratory distress noted  Stairs            Wheelchair Mobility    Modified Rankin (Stroke Patients Only)       Balance Overall balance assessment: Needs assistance Sitting-balance support: No upper extremity supported;Feet supported Sitting balance-Leahy Scale: Good     Standing balance support: Bilateral upper extremity supported Standing balance-Leahy Scale: Poor Standing balance comment: Pt  able to maintain wide stance however loses balance within 3-4 seconds after placing feet together                             Pertinent Vitals/Pain Pain Assessment: 0-10 Pain  Score: 4  Pain Location: Upper back/neck Pain Descriptors / Indicators: Sore Pain Intervention(s): Monitored during session;Patient requesting pain meds-RN notified    Home Living Family/patient expects to be discharged to:: Private residence Living Arrangements: Alone Available Help at Discharge: Family;Friend(s);Available PRN/intermittently Type of Home: House Home Access: Stairs to enter Entrance Stairs-Rails: Right;Left;Can reach both Entrance Stairs-Number of Steps: 4 Home Layout: One level Home Equipment: Walker - 2 wheels;Shower seat;Wheelchair - manual;Cane - single point      Prior Function Level of Independence: Needs assistance   Gait / Transfers Assistance Needed: Pt reported that he ambulates with RW at all times and has had multiple falls (at least 11 in the past 6 months) secondary to syncopal episodes. Pt reports that all of his falls occurred due to syncope  ADL's / Homemaking Assistance Needed: Reports he has been requiring assist from friend with bathing recently. Otherwise independent with ADLs. 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 Elbow flex/ext and grip strength WFL LUE Deficits / Details: L shoulder flexion weakness 3+/5, elbow flex/ext and grip strength grossly WFL    Lower Extremity Assessment Lower Extremity Assessment: Overall WFL for tasks assessed       Communication   Communication: No difficulties  Cognition Arousal/Alertness: Awake/alert Behavior During Therapy: WFL for tasks assessed/performed Overall Cognitive Status: Within Functional Limits for tasks assessed                                        General Comments      Exercises     Assessment/Plan    PT  Assessment Patient needs continued PT services  PT Problem List Decreased strength;Decreased activity tolerance;Decreased balance;Decreased mobility;Decreased coordination;Decreased knowledge of use of DME;Decreased safety awareness       PT Treatment Interventions DME instruction;Stair training;Gait training;Functional mobility training;Therapeutic exercise;Therapeutic activities;Balance training;Neuromuscular re-education;Patient/family education    PT Goals (Current goals can be found in the Care Plan section)  Acute Rehab PT Goals Patient Stated Goal: Pt would like to return home PT Goal Formulation: With patient Time For Goal Achievement: 09/28/16 Potential to Achieve Goals: Good    Frequency Min 2X/week   Barriers to discharge Decreased caregiver support Lives alone    Co-evaluation               End of Session Equipment Utilized During Treatment: Gait belt;Oxygen;Other (comment) (2 L/min) Activity Tolerance: Patient tolerated treatment well Patient left: with call bell/phone within reach;in chair;Other (comment) (Pt left back in recliner as received upon arrival) Nurse Communication: Patient requests pain meds PT Visit Diagnosis: Muscle weakness (generalized) (M62.81);Unsteadiness on feet (R26.81);Repeated falls (R29.6)    Time: 1610-9604 PT Time Calculation (min) (ACUTE ONLY): 23 min   Charges:   PT Evaluation $PT Eval Low Complexity: 1 Procedure PT Treatments $Gait Training: 8-22 mins   PT G Codes:   PT G-Codes **NOT FOR INPATIENT CLASS**  Functional Assessment Tool Used: AM-PAC 6 Clicks Basic Mobility;Clinical judgement Functional Limitation: Mobility: Walking and moving around Mobility: Walking and Moving Around Current Status [phone](G8978): At least 1 percent but less than 20 percent impaired, limited or restricted Mobility: Walking and Moving Around Goal Status [phone](G8979): At least 1 percent but less than 20 percent impaired, limited or restricted    Lynnea MaizesJason D  Analiyah Lechuga PT, DPT    Abagayle Klutts 09/14/2016, 5:19 PM

## 2016-09-15 ENCOUNTER — Inpatient Hospital Stay: Payer: Medicare Other

## 2016-09-15 DIAGNOSIS — R55 Syncope and collapse: Secondary | ICD-10-CM

## 2016-09-15 MED ORDER — IPRATROPIUM-ALBUTEROL 0.5-2.5 (3) MG/3ML IN SOLN: 3.0000 mL | Freq: Two times a day (BID) | RESPIRATORY_TRACT | Status: DC | PRN

## 2016-09-15 MED ORDER — LORAZEPAM 1 MG PO TABS
1.0000 mg | ORAL_TABLET | Freq: Once | ORAL | Status: AC
  Administered 2016-09-15: 11:00:00 1 mg via ORAL
  Filled 2016-09-15: qty 1

## 2016-09-15 MED ORDER — LISINOPRIL 10 MG PO TABS
10.0000 mg | ORAL_TABLET | Freq: Two times a day (BID) | ORAL | Status: DC
  Administered 2016-09-15 (×2): 10 mg via ORAL
  Filled 2016-09-15 (×3): qty 1

## 2016-09-15 NOTE — Consult Note (Signed)
Reason for Consult:Syncope Referring Physician: Nemiah CommanderKalisetti  CC: Syncope  HPI: Chad Huffman is an 71 y.o. male who reports syncopal episodes for the past year.  Since August they have become more frequent, 2-3 times per month.  She reports having no warning.  It is usually upon standing and he reports no syncopal episodes while sitting or lying down.  He does still drive.  He reports a very brief period of loss of consciousness.  There is no post-syncopal confusion.  Patient presented to the ED with complaints of pain and mentioned the syncope for which he was admitted.  Has has no previous work up.    Past Medical History:  Diagnosis Date  . Antalgic gait   . Carotid artery occlusion   . Chronic neck and back pain   . Complication of anesthesia    pt has had cervical fusion-limited neck flextion  . COPD (chronic obstructive pulmonary disease) (HCC)   . Depression   . DJD (degenerative joint disease)    Right shoulder  . Hypertension   . Lumbago   . Peripheral neuropathy [facility](HCC)     Past Surgical History:  Procedure Laterality Date  . ABDOMINAL AORTIC ANEURYSM REPAIR  2009   Endovascular AAA repair  . ABDOMINAL AORTIC ANEURYSM REPAIR    . CAROTID ANGIOGRAPHY  09/09/2016   Procedure: Carotid Angiography;  Surgeon: Nada LibmanVance W Brabham, MD;  [facility];  Service: Cardiovascular;;  rt. carotid  . CARPAL TUNNEL RELEASE     bilateral CTS  . HAMMER TOE SURGERY  12/10/2011   Procedure: HAMMER TOE CORRECTION;  Surgeon: Ernestene KielMax Hyatt, DPM;  Location: [facility];  Service: Podiatry;  Laterality: Right;  Right hammertoe repair second right with 2.5x42 orthopro screw   . IR GENERIC HISTORICAL  04/05/2016   IR GASTROSTOMY TUBE MOD SED 04/05/2016 [facility]  . IR GENERIC HISTORICAL  05/12/2016   IR CM INJ ANY COLONIC TUBE W/FLUORO 05/12/2016 Malachy MoanHeath McCullough, MD [facility]  . IR GENERIC HISTORICAL  05/26/2016   IR CM INJ ANY COLONIC TUBE W/FLUORO 05/26/2016 [facility]  . IR GENERIC HISTORICAL  05/28/2016   IR REPLC GASTRO/COLONIC TUBE PERCUT W/FLUORO 05/28/2016 Brayton ElKevin Bruning, PA-C [facility]  . IR GENERIC HISTORICAL  07/02/2016   IR GASTROSTOMY TUBE REMOVAL 07/02/2016 Brayton ElKevin Bruning, PA-C [facility]  . METATARSAL OSTEOTOMY  12/10/2011   Procedure: METATARSAL OSTEOTOMY;  Surgeon: Ernestene KielMax Hyatt, DPM;  Location: [facility];  Service: Podiatry;  Laterality: Right;  Right Keller arthroplasty with size 5 silicone implant right great toe; second metatarsal osteotomy with 2.0x14 screw  . NECK SURGERY    . PERIPHERAL VASCULAR CATHETERIZATION N/A 02/11/2015   Procedure: Carotid Angiography;  Surgeon: Nada LibmanVance W Brabham, MD;  [facility];  Service: Cardiovascular;  Laterality: N/A;  . PILONIDAL CYST / SINUS EXCISION    . Rotary cuff    . SPINE SURGERY  09/15/11   Scar tissue removed- back  . SPINE SURGERY  09/15/11   Scar tissue removed- back  . TONSILLECTOMY    . TRACHEOSTOMY TUBE PLACEMENT N/A 03/03/2016   Procedure: TRACHEOSTOMY;  Surgeon: Geanie LoganPaul Bennett, MD;  Location: [facility];  Service: ENT;  Laterality: N/A;    Family History  Problem Relation Age of Onset  . Stomach cancer Mother   . Hypertension Brother   . Heart attack Father     Social History:  reports that he quit smoking about 10 years ago. He has never  used smokeless tobacco. He reports that he drinks about 1.2 oz of alcohol per week . He reports that he does not use drugs.  Allergies  Allergen Reactions  . Ampicillin Swelling and Other (See Comments)    Unable to obtain enough information to answer additional questions about this medication.  Has patient had a PCN reaction causing immediate rash, facial/tongue/throat swelling, SOB or lightheadedness with hypotension: Yes Has patient had a PCN reaction causing severe rash involving mucus membranes or skin necrosis: No Has patient had a PCN reaction that required hospitalization No Has patient had a PCN reaction  occurring within the last 10 years: Yes If all of the above answers are "NO", then may pr  . Nortriptyline Other (See Comments)    Sleep walking  . Brimonidine Other (See Comments)    Unknown reaction per MAR   . Cyclobenzaprine Other (See Comments)    Hallucinations  . Tizanidine Other (See Comments)    Hallucinations  . Gabapentin Rash    Medications:  I have reviewed the patient's current medications. Prior to Admission:  Prescriptions Prior to Admission  Medication Sig Dispense Refill Last Dose  . aspirin EC 325 MG tablet Take 1 tablet (325 mg total) by mouth daily. 30 tablet 0 09/12/2016 at Unknown time  . aspirin EC 81 MG tablet Take 81 mg by mouth daily.   09/12/2016 at Unknown time  . folic acid (FOLVITE) 1 MG tablet Take 1 mg by mouth daily.    09/12/2016 at Unknown time  . levothyroxine (SYNTHROID, LEVOTHROID) 125 MCG tablet Take 125 mcg by mouth daily before breakfast.   Past Week at Unknown time  . lisinopril (PRINIVIL,ZESTRIL) 20 MG tablet Take 1 tablet (20 mg total) by mouth 2 (two) times daily. 60 tablet 6 09/12/2016 at Unknown time  . metoprolol tartrate (LOPRESSOR) 25 MG tablet Take 1 tablet (25 mg total) by mouth 2 (two) times daily. 60 tablet 6 09/12/2016 at Unknown time  . Multiple Vitamin (MULTIVITAMIN WITH MINERALS) TABS tablet Take 1 tablet by mouth daily.   09/12/2016 at Unknown time  . QUEtiapine (SEROQUEL) 50 MG tablet Take 50 mg by mouth at bedtime.   09/12/2016 at Unknown time  . sertraline (ZOLOFT) 50 MG tablet Take 75 mg by mouth daily.    09/12/2016 at Unknown time  . torsemide (DEMADEX) 20 MG tablet Take 20 mg by mouth daily.    09/12/2016 at Unknown time  . albuterol (ACCUNEB) 1.25 MG/3ML nebulizer solution Take 1 ampule by nebulization every 4 (four) hours as needed for wheezing.   prn at prn  . budesonide (PULMICORT) 0.5 MG/2ML nebulizer solution Take 0.5 mg by nebulization 2 (two) times daily.   prn at prn  . clonazePAM (KLONOPIN) 0.5 MG tablet Take 1 tablet  (0.5 mg total) by mouth 2 (two) times daily. 30 tablet 0 prn at prn  . metoCLOPramide (REGLAN) 5 MG tablet Take 2.5 mg by mouth every 6 (six) hours.    09/08/2016  . oxyCODONE (OXY IR/ROXICODONE) 5 MG immediate release tablet Take 1 tablet (5 mg total) by mouth every 8 (eight) hours. 30 tablet 0 prn at prn  . OXYGEN 4 L/min by Intratracheal route as needed (for shortness of breath).    09/08/2016  . predniSONE (DELTASONE) 5 MG tablet Take 1 tablet by mouth daily.  0   . predniSONE (STERAPRED UNI-PAK 21 TAB) 10 MG (21) TBPK tablet Please take 6 pills in the morning on the day 1 and 2, then taper  by one pill every 2 days until finished, thank you 42 tablet 0 09/08/2016  . SYMBICORT 160-4.5 MCG/ACT inhaler Inhale 2 puffs into the lungs 2 (two) times daily.  3 prn at prn  . tiotropium (SPIRIVA) 18 MCG inhalation capsule Place 1 capsule (18 mcg total) into inhaler and inhale daily. 30 capsule 12 prn at prn   Scheduled: . aspirin EC  81 mg Oral Daily  . cefTRIAXone (ROCEPHIN) IVPB 1 gram/50 mL D5W  1 g Intravenous Q24H  . clonazePAM  0.5 mg Oral BID  . enoxaparin (LOVENOX) injection  40 mg Subcutaneous Q24H  . lisinopril  10 mg Oral BID  . mouth rinse  15 mL Mouth Rinse BID  . metoCLOPramide  2.5 mg Oral Q6H  . metoprolol tartrate  25 mg Oral BID  . mometasone-formoterol  2 puff Inhalation BID  . predniSONE  5 mg Oral Daily  . QUEtiapine  50 mg Oral QHS  . sertraline  75 mg Oral Daily  . tiotropium  18 mcg Inhalation Daily  . torsemide  20 mg Oral Daily    ROS: History obtained from the patient  General ROS: negative for - chills, fatigue, fever, night sweats, weight gain or weight loss Psychological ROS: negative for - behavioral disorder, hallucinations, memory difficulties, mood swings or suicidal ideation Ophthalmic ROS: negative for - blurry vision, double vision, eye pain or loss of vision ENT ROS: negative for - epistaxis, nasal discharge, oral lesions, sore throat, tinnitus or  vertigo Allergy and Immunology ROS: negative for - hives or itchy/watery eyes Hematological and Lymphatic ROS: negative for - bleeding problems, bruising or swollen lymph nodes Endocrine ROS: negative for - galactorrhea, hair pattern changes, polydipsia/polyuria or temperature intolerance Respiratory ROS: negative for - cough, hemoptysis, shortness of breath or wheezing Cardiovascular ROS: negative for - chest pain, dyspnea on exertion, edema or irregular heartbeat Gastrointestinal ROS: negative for - abdominal pain, diarrhea, hematemesis, nausea/vomiting or stool incontinence Genito-Urinary ROS: negative for - dysuria, hematuria, incontinence or urinary frequency/urgency Musculoskeletal ROS: neck and back pain Neurological ROS: as noted in HPI Dermatological ROS: negative for rash and skin lesion changes  Physical Examination: Blood pressure (!) 118/58, pulse 61, temperature 98 F (36.7 C), temperature source Oral, resp. rate 20, height 5\' 8"  (1.727 m), weight 91.5 kg (201 lb 12.8 oz), SpO2 94 %.  HEENT-  Normocephalic, no lesions, without obvious abnormality.  Normal external eye and conjunctiva.  Normal TM's bilaterally.  Normal auditory canals and external ears. Normal external nose, mucus membranes and septum.  Normal pharynx. Cardiovascular- S1, S2 normal, pulses palpable throughout   Lungs- chest clear, no wheezing, rales, normal symmetric air entry Abdomen- soft, non-tender; bowel sounds normal; no masses,  no organomegaly Extremities- no edema Lymph-no adenopathy palpable Musculoskeletal-neck stiffness Skin-warm and dry, no hyperpigmentation, vitiligo, or suspicious lesions  Neurological Examination   Mental Status: Alert, oriented, thought content appropriate.  Speech fluent without evidence of aphasia.  Able to follow 3 step commands without difficulty. Cranial Nerves: II: Discs flat bilaterally; Visual fields grossly normal, pupils equal, round, reactive to light and  accommodation III,IV, VI: ptosis not present, extra-ocular motions intact bilaterally V,VII: smile symmetric, facial light touch sensation normal bilaterally VIII: hearing normal bilaterally IX,X: gag reflex present XI: bilateral shoulder shrug XII: midline tongue extension Motor: Right : Upper extremity   4+/5  with decreased ROM   Left:     Upper extremity   5/5  Lower extremity   5/5  Lower extremity   5/5 Tone and bulk:normal tone throughout; no atrophy noted Sensory: Pinprick and light touch intact throughout, bilaterally Deep Tendon Reflexes: 2+ in the upper extremities and absent in the lower extremities Plantars: Right: mute   Left: mute Cerebellar: Dysmetria with finger-to-nose and heel-to-shin testing on the leftt Gait: not tested due to safety concerns    Laboratory Studies:   Basic Metabolic Panel:  Recent Labs Lab 09/09/16 0704 09/10/16 0206 09/13/16 1407 09/14/16 0357  NA 137 137 136 137  K 4.0 4.2 3.8 3.8  CL 91* 90* 96* 97*  CO2  --  38* 34* 35*  GLUCOSE 95 89 80 102*  BUN 34* 27* 16 16  CREATININE 1.30* 1.31* 1.08 1.20  CALCIUM  --  8.7* 8.8* 8.3*    Liver Function Tests: No results for input(s): AST, ALT, ALKPHOS, BILITOT, PROT, ALBUMIN in the last 168 hours. No results for input(s): LIPASE, AMYLASE in the last 168 hours. No results for input(s): AMMONIA in the last 168 hours.  CBC:  Recent Labs Lab 09/09/16 0704 09/10/16 0206 09/13/16 1407 09/14/16 0357  WBC  --  5.6 5.7 5.9  HGB 10.5* 9.4* 11.1* 9.2*  HCT 31.0* 30.9* 33.3* 28.2*  MCV  --  94.2 91.6 91.0  PLT  --  113* 145* 134*    Cardiac Enzymes:  Recent Labs Lab 09/13/16 1407  TROPONINI <0.03    BNP: Invalid input(s): POCBNP  CBG: No results for input(s): GLUCAP in the last 168 hours.  Microbiology: Results for orders placed or performed during the hospital encounter of 09/13/16  Urine culture     Status: Abnormal (Preliminary result)   Collection Time: 09/13/16   5:15 PM  Result Value Ref Range Status   Specimen Description URINE, RANDOM  Final   Special Requests NONE  Final   Culture >=100,000 COLONIES/mL GRAM NEGATIVE RODS (A)  Final   Report Status PENDING  Incomplete    Coagulation Studies: No results for input(s): LABPROT, INR in the last 72 hours.  Urinalysis:  Recent Labs Lab 09/13/16 1715  COLORURINE AMBER*  LABSPEC 1.017  PHURINE 6.0  GLUCOSEU NEGATIVE  HGBUR SMALL*  BILIRUBINUR NEGATIVE  KETONESUR NEGATIVE  PROTEINUR 30*  NITRITE NEGATIVE  LEUKOCYTESUR LARGE*    Lipid Panel:     Component Value Date/Time   TRIG 111 07/19/2016 1153    HgbA1C:  Lab Results  Component Value Date   HGBA1C 5.6 06/10/2016    Urine Drug Screen:     Component Value Date/Time   LABOPIA NONE DETECTED 02/27/2016 1725   COCAINSCRNUR NONE DETECTED 02/27/2016 1725   LABBENZ POSITIVE (A) 02/27/2016 1725   AMPHETMU NONE DETECTED 02/27/2016 1725   THCU NONE DETECTED 02/27/2016 1725   LABBARB NONE DETECTED 02/27/2016 1725    Alcohol Level: No results for input(s): ETH in the last 168 hours.  Other results: EKG: sinus rhythm with 1st degree AV block at 81 bpm.  Imaging: No results found.   Assessment/Plan: 71 year old male with a history of syncope for the past year.  Etiology unclear.  Although patient reports it to be positional, he is not orthostatic.  MRI and EEG pending.    Recommendations: 1.  Telemetry monitoring 2.  If MRI and EEG unremarkable patient may benefit from long term cardiac monitoring to capture one of these episodes and rule out cardiac arrhythmis.     Thana Farr, MD Neurology [phone] 09/15/2016, 10:38 AM

## 2016-09-15 NOTE — Progress Notes (Signed)
[employer] [facility] [facility] [place] [facility]   PATIENT NAME: Chad Huffman    MR#:  [id number]  DATE OF BIRTH:  1946-06-21  SUBJECTIVE:  CHIEF COMPLAINT:   Chief Complaint  Patient presents with  . Loss of Consciousness   -Recurrent syncopal episodes at home. Currently denies any symptoms. -Awaiting MRI and neurology consult  REVIEW OF SYSTEMS:  Review of Systems  Constitutional: Negative for chills, fever and malaise/fatigue.  HENT: Negative for congestion, ear discharge, hearing loss and nosebleeds.   Eyes: Negative for blurred vision and double vision.  Respiratory: Negative for cough, shortness of breath and wheezing.   Cardiovascular: Negative for chest pain and palpitations.  Gastrointestinal: Negative for abdominal pain, constipation, diarrhea, nausea and vomiting.  Genitourinary: Negative for dysuria and urgency.  Musculoskeletal: Positive for falls. Negative for myalgias.  Neurological: Positive for dizziness. Negative for speech change, focal weakness, seizures and headaches.    DRUG ALLERGIES:   Allergies  Allergen Reactions  . Ampicillin Swelling and Other (See Comments)    Unable to obtain enough information to answer additional questions about this medication.  Has patient had a PCN reaction causing immediate rash, facial/tongue/throat swelling, SOB or lightheadedness with hypotension: Yes Has patient had a PCN reaction causing severe rash involving mucus membranes or skin necrosis: No Has patient had a PCN reaction that required hospitalization No Has patient had a PCN reaction occurring within the last 10 years: Yes If all of the above answers are "NO", then may pr  . Nortriptyline Other (See Comments)    Sleep walking  . Brimonidine Other (See Comments)    Unknown reaction per MAR   . Cyclobenzaprine Other (See Comments)    Hallucinations  . Tizanidine Other (See Comments)    Hallucinations  . Gabapentin Rash    VITALS:  Blood pressure (!)  93/46, pulse (!) 59, temperature 97.5 F (36.4 C), temperature source Oral, resp. rate 20, height 5\' 8"  (1.727 m), weight 91.5 kg (201 lb 12.8 oz), SpO2 91 %.  PHYSICAL EXAMINATION:  Physical Exam  GENERAL:  71 y.o.-year-old patient lying in the bed with no acute distress.  EYES: Pupils equal, round, reactive to light and accommodation. No scleral icterus. Extraocular muscles intact.  HEENT: Head atraumatic, normocephalic. Oropharynx and nasopharynx clear.  NECK:  Supple, no jugular venous distention. No thyroid enlargement, no tenderness.  LUNGS: Normal breath sounds bilaterally, no wheezing, rales,rhonchi or crepitation. No use of accessory muscles of respiration.  CARDIOVASCULAR: S1, S2 normal. No  rubs, or gallops. 2/6 systolic murmur is present ABDOMEN: Soft, nontender, nondistended. Bowel sounds present. No organomegaly or mass.  EXTREMITIES: No pedal edema, cyanosis, or clubbing.  NEUROLOGIC: Cranial nerves II through XII are intact. Muscle strength 5/5 in all extremities, slightly decreased strength of right upper extremity especially in the proximal muscles. Sensation intact. Gait not checked.  PSYCHIATRIC: The patient is alert and oriented x 3.  SKIN: No obvious rash, lesion, or ulcer.    LABORATORY PANEL:   CBC  Recent Labs Lab 09/14/16 0357  WBC 5.9  HGB 9.2*  HCT 28.2*  PLT 134*   ------------------------------------------------------------------------------------------------------------------  Chemistries   Recent Labs Lab 09/14/16 0357  NA 137  K 3.8  CL 97*  CO2 35*  GLUCOSE 102*  BUN 16  CREATININE 1.20  CALCIUM 8.3*   ------------------------------------------------------------------------------------------------------------------  Cardiac Enzymes  Recent Labs Lab 09/13/16 1407  TROPONINI <0.03   ------------------------------------------------------------------------------------------------------------------  RADIOLOGY:  No results  found.  EKG:   Orders placed  or performed during the hospital encounter of 09/13/16  . ED EKG  . ED EKG  . EKG 12-Lead  . EKG 12-Lead    ASSESSMENT AND PLAN:   71 year old male with past medical history significant for COPD, diastolic CHF, recurrent syncopal episodes, C-spine disease status post surgery, carotid stenosis presents to the hospital secondary to fall and syncope again.  #1 recurrent syncope-unknown causes at this time. Definitely not right carotid stenosis as patient had recent vascular surgery follow up as outpatient and the repeated CT angiogram saying that his stenosis is not worsen up to cause complete occlusion. -No orthostatic hypotension changes. -Cardiology workup in the past did not reveal anything. Neurology workup was not done. -We'll get MRI of the brain, C-spine, EEG and neurology consult. -Appreciate physical therapy consult and they have recommended home health.  #2 urinary tract infection-urine cultures are pending. Continue IV Rocephin at this time.  #3 COPD-on chronic 2 L home oxygen. Continue home inhalers and as needed nebs. No indication for steroids at this time.  #4 chronic diastolic CHF-well compensated -Continue torsemide daily.  #5 depression-continue home medications.  #6 hypertension-continue lisinopril and metoprolol.  #7 DVT prophylaxis-Lovenox  Anticipate discharge tomorrow     All the records are reviewed and case discussed with Care Management/Social Workerr. Management plans discussed with the patient, family and they are in agreement.  CODE STATUS:  Full code  TOTAL TIME TAKING CARE OF THIS PATIENT: 37 minutes.   POSSIBLE D/C tomorrow, DEPENDING ON CLINICAL CONDITION.   Enid BaasKALISETTI,Donaven Criswell M.D on 09/15/2016 at 10:15 AM  Between 7am to 6pm - Pager - [phone]  After 6pm go to [email or web address] - Social research officer, governmentpassword EPAS [facility]  [facility]  Office  [phone]  CC: Primary care physician; Dorothey BasemanAVID BRONSTEIN, MD

## 2016-09-15 NOTE — Discharge Instructions (Signed)
[facility] Failure [facility] appointment on September 29, 2016 at 9:00am with Clarisa Kindredina Horace Lukas, FNP. Please call [phone] to reschedule.   Please seek medical attention for any high fevers, chest pain, shortness of breath, change in behavior, persistent vomiting, bloody stool or any other new or concerning symptoms.

## 2016-09-16 LAB — URINE CULTURE: Culture: >=100000 — AB

## 2016-09-16 LAB — GLUCOSE, CAPILLARY: Glucose-Capillary: 87 mg/dL (ref 65–99)

## 2016-09-16 MED ORDER — LISINOPRIL 10 MG PO TABS: 10.0000 mg | ORAL_TABLET | Freq: Two times a day (BID) | ORAL | 2 refills | Status: DC

## 2016-09-16 MED ORDER — LISINOPRIL 10 MG PO TABS: 10.0000 mg | ORAL_TABLET | Freq: Every day | ORAL | 2 refills | Status: DC

## 2016-09-16 MED ORDER — LEVOTHYROXINE SODIUM 75 MCG PO TABS: 75.0000 ug | ORAL_TABLET | Freq: Every day | ORAL | 0 refills | Status: DC

## 2016-09-16 MED ORDER — OXYCODONE HCL 5 MG PO TABS: 5.0000 mg | ORAL_TABLET | Freq: Three times a day (TID) | ORAL | 0 refills | Status: DC | PRN

## 2016-09-16 MED ORDER — CIPROFLOXACIN HCL 500 MG PO TABS: 500.0000 mg | ORAL_TABLET | Freq: Two times a day (BID) | ORAL | 0 refills | Status: DC

## 2016-09-16 MED ORDER — CEPHALEXIN 500 MG PO CAPS: 500.0000 mg | ORAL_CAPSULE | Freq: Three times a day (TID) | ORAL | 0 refills | Status: DC

## 2016-09-16 NOTE — Progress Notes (Signed)
qPhysical Therapy Treatment Patient Details Name: Chad Huffman Chad Huffman MRN: [id number] DOB: 1945-07-31 Today's Date: 09/16/2016    History of Present Illness Chad Huffman Huffman  is a 71 y.o. male who presents with falls, syncopal episode, weakness, and urinary symptoms. Patient's workup here in the ED seemed to indicate UTI. Workup in the ED was otherwise largely within normal limits. Hospitalists were called for admission and further treatment. Pt is now admitted for his UTI. He recently underwent carotid angiogram due to carotid stenosis. He was found to have 60% R carotid stenosis. No intervention performed. Pt currently frustrated regarding lack of diagnosis regarding his syncopal episodes    PT Comments    Awaiting discharge but requesting to ambulate. He was able to stand and walk around nursing unit x 1 with walker and wc follow for safety.  Assist for O2.  Pt without knee buckling or formal LOB's but gait remains generally weak and at an increased risk for falls.  Pt reports feeling comfortable with discharge plans but expressed frustration in general over decline in his health.    Follow Up Recommendations  Home health PT;Other (comment);Supervision/Assistance - 24 hour;Supervision for mobility/OOB     Equipment Recommendations  None recommended by PT;Other (comment)    Recommendations for Other Services       Precautions / Restrictions Precautions Precautions: Fall Restrictions Weight Bearing Restrictions: No    Mobility  Bed Mobility               General bed mobility comments: Received upright in recliner and left in recliner  Transfers Overall transfer level: Needs assistance Equipment used: Rolling walker (2 wheeled) Transfers: Sit to/from Stand Sit to Stand: Supervision         General transfer comment: Good technique and safety demonstrated with transfers to/from recliner. No dizziness or lightheadedness reported during transfers  Ambulation/Gait Ambulation/Gait  assistance: Min guard Ambulation Distance (Feet): 200 Feet Assistive device: Rolling walker (2 wheeled) Gait Pattern/deviations: Step-through pattern;Decreased step length - right;Decreased step length - left;Decreased stride length Gait velocity: Decreased but functional for limited community mobility Gait velocity interpretation: Below normal speed for age/gender General Gait Details: able to ambualte x 1 around unit wihtout knee buckling or LOB.  gait remains generally unsteady with increased fall risk.   Stairs            Wheelchair Mobility    Modified Rankin (Stroke Patients Only)       Balance Overall balance assessment: Needs assistance Sitting-balance support: No upper extremity supported;Feet supported       Standing balance support: Bilateral upper extremity supported Standing balance-Leahy Scale: Poor                              Cognition Arousal/Alertness: Awake/alert Behavior During Therapy: WFL for tasks assessed/performed Overall Cognitive Status: Within Functional Limits for tasks assessed                                        Exercises      General Comments        Pertinent Vitals/Pain Pain Assessment: 0-10 Pain Score: 2  Pain Location: Upper back/neck Pain Descriptors / Indicators: Sore Pain Intervention(s): Monitored during session;Limited activity within patient's tolerance    Home Living  Prior Function            PT Goals (current goals can now be found in the care plan section) Progress towards PT goals: Progressing toward goals    Frequency    Min 2X/week      PT Plan Current plan remains appropriate    Co-evaluation             End of Session Equipment Utilized During Treatment: Gait belt;Oxygen;Other (comment) Activity Tolerance: Patient tolerated treatment well Patient left: with call bell/phone within reach;in chair;with family/visitor present;with  chair alarm set         Time: 1610-9604 PT Time Calculation (min) (ACUTE ONLY): 17 min  Charges:  $Gait Training: 8-22 mins                    G Codes:       Danielle Dess, PTA 09/16/16, 11:24 AM

## 2016-09-16 NOTE — Progress Notes (Signed)
Patient discharged home per MD orders. Prescription given to patient. All discharge instructions given and all questions answered.

## 2016-09-16 NOTE — Care Management Note (Signed)
Case Management Note  Patient Details  Name: Chad Huffman MRN: [id number] Date of Birth: 05/09/1946  Subjective/Objective:  Spoke with patient regarding discharge planning. He is agreeable to home health. Lives alone and is not able to drive at this time due to syncopal episodes and falls. Uses a walker. No home O2. Offered patient choice of agencies. [facility], H[facility]. Patient needs no DME. PCP is Dr. Terance HartBronstein, last seen in the past 6 months.                   Action/Plan:  [facility] for RN, PT and HHA.   Expected Discharge Date:  09/16/16               Expected Discharge Plan:  Home w Home Health Services  In-House Referral:     Discharge planning Services  CM Consult  Post Acute Care Choice:  Home Health Choice offered to:  Patient  DME Arranged:    DME Agency:     HH Arranged:  RN, PT, Nurse's Aide HH Agency:     Status of Service:  Completed, signed off  If discussed at Long Length of Stay Meetings, dates discussed:    Additional Comments:  Chad MemosLisa M Nevae Pinnix, RN 09/16/2016, 10:39 AM

## 2016-09-16 NOTE — Discharge Summary (Signed)
[facility] [place] [facility]   PATIENT NAME: Chad Huffman    MR#:  [id number]  DATE OF BIRTH:  06/28/1945  DATE OF ADMISSION:  09/13/2016   ADMITTING PHYSICIAN: Oralia Manis, MD  DATE OF DISCHARGE: 09/16/2016 12:40 PM  PRIMARY CARE PHYSICIAN: Dorothey Baseman, MD   ADMISSION DIAGNOSIS:   Lower urinary tract infectious disease [N39.0] Lower urinary tract infection [N39.0]  DISCHARGE DIAGNOSIS:   Principal Problem:   UTI (urinary tract infection) Active Problems:   Essential hypertension   Hypothyroidism   GERD without esophagitis   Weakness   Chronic diastolic CHF (congestive heart failure) (HCC)   COPD (chronic obstructive pulmonary disease) (HCC)   Pressure injury of skin   SECONDARY DIAGNOSIS:   Past Medical History:  Diagnosis Date  . Antalgic gait   . Carotid artery occlusion   . Chronic neck and back pain   . Complication of anesthesia    pt has had cervical fusion-limited neck flextion  . COPD (chronic obstructive pulmonary disease) (HCC)   . Depression   . DJD (degenerative joint disease)    Right shoulder  . Hypertension   . Lumbago   . Peripheral neuropathy [facility])     HOSPITAL COURSE:   71 year old male with past medical history significant for COPD, diastolic CHF, recurrent syncopal episodes, C-spine disease status post surgery, carotid stenosis presents to the hospital secondary to fall and syncope again.  #1 recurrent syncope- unknown causes at this time. Definitely not right carotid stenosis as patient had recent vascular surgery follow up as outpatient and the repeated CT angiogram saying that his stenosis is not worsened up to cause complete occlusion. It is about 50-60% stenosis -No orthostatic hypotension changes. -Cardiology workup in the past did not reveal anything. Outpatient cardiology follow-up for possible loop monitor recommended. -Appreciate neurology consult in the hospital. No neurological causes identified.  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 MRI of C-spine showing chronic changes from his previous surgery and no new findings. -EEG with no seizure-like activity. Normal findings noted -Appreciate physical therapy consult and they have recommended home health. -Currently asymptomatic. Refused a walker that has a seat at this time  #2 urinary tract infection-urine cultures growing Serratia, received Rocephin in the hospital. Being discharged on ciprofloxacin based on sensitivities  #3 COPD-on chronic 2-3 L home oxygen. Continue home inhalers and as needed nebs. No indication for steroids at this time.  #4 chronic diastolic CHF-well compensated -Continue torsemide daily.  #5 depression-continue home medications.  #6 hypertension-continue lisinopril and metoprolol.  Discharge with home health today  DISCHARGE CONDITIONS:   Guarded  CONSULTS OBTAINED:   Treatment Team:  Thana Farr, MD  DRUG ALLERGIES:   Allergies  Allergen Reactions  . Ampicillin Swelling and Other (See Comments)    Unable to obtain enough information to answer additional questions about this medication.  Has patient had a PCN reaction causing immediate rash, facial/tongue/throat swelling, SOB or lightheadedness with hypotension: Yes Has patient had a PCN reaction causing severe rash involving mucus membranes or skin necrosis: No Has patient had a PCN reaction that required hospitalization No Has patient had a PCN reaction occurring within the last 10 years: Yes If all of the above answers are "NO", then may pr  . Nortriptyline Other (See Comments)    Sleep walking  . Brimonidine Other (See Comments)    Unknown reaction per MAR   . Cyclobenzaprine Other (See Comments)    Hallucinations  .  Tizanidine Other (See Comments)    Hallucinations  . Gabapentin Rash   DISCHARGE MEDICATIONS:   Allergies as of 09/16/2016      Reactions   Ampicillin Swelling, Other (See Comments)    Unable to obtain enough information to answer additional questions about this medication.  Has patient had a PCN reaction causing immediate rash, facial/tongue/throat swelling, SOB or lightheadedness with hypotension: Yes Has patient had a PCN reaction causing severe rash involving mucus membranes or skin necrosis: No Has patient had a PCN reaction that required hospitalization No Has patient had a PCN reaction occurring within the last 10 years: Yes If all of the above answers are "NO", then may pr   Nortriptyline Other (See Comments)   Sleep walking   Brimonidine Other (See Comments)   Unknown reaction per [facility]    Cyclobenzaprine Other (See Comments)   Hallucinations   Tizanidine Other (See Comments)   Hallucinations   Gabapentin Rash      Medication List    TAKE these medications   albuterol 1.25 MG/3ML nebulizer solution Commonly known as:  ACCUNEB Take 1 ampule by nebulization every 4 (four) hours as needed for wheezing.   aspirin EC 81 MG tablet Take 81 mg by mouth daily. What changed:  Another medication with the same name was removed. Continue taking this medication, and follow the directions you see here.   budesonide 0.5 MG/2ML nebulizer solution Commonly known as:  PULMICORT Take 0.5 mg by nebulization 2 (two) times daily.   ciprofloxacin 500 MG tablet Commonly known as:  CIPRO Take 1 tablet (500 mg total) by mouth 2 (two) times daily. X 6 more days   clonazePAM 0.5 MG tablet Commonly known as:  KLONOPIN Take 1 tablet (0.5 mg total) by mouth 2 (two) times daily.   folic acid 1 MG tablet Commonly known as:  FOLVITE Take 1 mg by mouth daily.   levothyroxine 75 MCG tablet Commonly known as:  SYNTHROID, LEVOTHROID Take 1 tablet (75 mcg total) by mouth daily before breakfast. What changed:  medication strength  how much to take   lisinopril 10 MG tablet Commonly known as:  PRINIVIL,ZESTRIL Take 1 tablet (10 mg total) by mouth at bedtime. What  changed:  medication strength  how much to take  when to take this   metoCLOPramide 5 MG tablet Commonly known as:  REGLAN Take 2.5 mg by mouth every 6 (six) hours.   metoprolol tartrate 25 MG tablet Commonly known as:  LOPRESSOR Take 1 tablet (25 mg total) by mouth 2 (two) times daily.   multivitamin with minerals Tabs tablet Take 1 tablet by mouth daily.   oxyCODONE 5 MG immediate release tablet Commonly known as:  Oxy IR/ROXICODONE Take 1 tablet (5 mg total) by mouth every 8 (eight) hours as needed for moderate pain. What changed:  when to take this  reasons to take this   OXYGEN 4 L/min by Intratracheal route as needed (for shortness of breath).   predniSONE 5 MG tablet Commonly known as:  DELTASONE Take 1 tablet by mouth daily. What changed:  Another medication with the same name was removed. Continue taking this medication, and follow the directions you see here.   QUEtiapine 50 MG tablet Commonly known as:  SEROQUEL Take 50 mg by mouth at bedtime.   sertraline 50 MG tablet Commonly known as:  ZOLOFT Take 75 mg by mouth daily.   SYMBICORT 160-4.5 MCG/ACT inhaler Generic drug:  budesonide-formoterol Inhale 2 puffs into the lungs 2 (  two) times daily.   tiotropium 18 MCG inhalation capsule Commonly known as:  SPIRIVA Place 1 capsule (18 mcg total) into inhaler and inhale daily.   torsemide 20 MG tablet Commonly known as:  DEMADEX Take 20 mg by mouth daily.        DISCHARGE INSTRUCTIONS:   1. PCP f/u in 1-2 weeks 2. Cardiology f/u in 2 weeks for possible loop monitor  DIET:   Cardiac diet  ACTIVITY:   Activity as tolerated  OXYGEN:   Home Oxygen: Yes.    Oxygen Delivery: 3 liters/min via Patient connected to nasal cannula oxygen  DISCHARGE LOCATION:   home   If you experience worsening of your admission symptoms, develop shortness of breath, life threatening emergency, suicidal or homicidal thoughts you must seek medical attention  immediately by calling 911 or calling your MD immediately  if symptoms less severe.  You Must read complete instructions/literature along with all the possible adverse reactions/side effects for all the Medicines you take and that have been prescribed to you. Take any new Medicines after you have completely understood and accpet all the possible adverse reactions/side effects.   Please note  You were cared for by a hospitalist during your hospital stay. If you have any questions about your discharge medications or the care you received while you were in the hospital after you are discharged, you can call the unit and asked to speak with the hospitalist on call if the hospitalist that took care of you is not available. Once you are discharged, your primary care physician will handle any further medical issues. Please note that NO REFILLS for any discharge medications will be authorized once you are discharged, as it is imperative that you return to your primary care physician (or establish a relationship with a primary care physician if you do not have one) for your aftercare needs so that they can reassess your need for medications and monitor your lab values.    On the day of Discharge:  VITAL SIGNS:   Blood pressure (!) 105/54, pulse 73, temperature 97.5 F (36.4 C), temperature source Oral, resp. rate 18, height 5\' 8"  (1.727 m), weight 91.5 kg (201 lb 12.8 oz), SpO2 96 %.  PHYSICAL EXAMINATION:    GENERAL:  71 y.o.-year-old patient lying in the bed with no acute distress.  EYES: Pupils equal, round, reactive to light and accommodation. No scleral icterus. Extraocular muscles intact.  HEENT: Head atraumatic, normocephalic. Oropharynx and nasopharynx clear.  NECK:  Supple, no jugular venous distention. No thyroid enlargement, no tenderness. Stiff neck- chronic LUNGS: Normal breath sounds bilaterally, no wheezing, rales,rhonchi or crepitation. No use of accessory muscles of respiration.   CARDIOVASCULAR: S1, S2 normal. No  rubs, or gallops. 2/6 systolic murmur is present ABDOMEN: Soft, nontender, nondistended. Bowel sounds present. No organomegaly or mass.  EXTREMITIES: No pedal edema, cyanosis, or clubbing.  NEUROLOGIC: Cranial nerves II through XII are intact. Muscle strength 5/5 in all extremities, slightly decreased strength of right upper extremity especially in the proximal muscles. Sensation intact. Gait not checked.  PSYCHIATRIC: The patient is alert and oriented x 3.  SKIN: No obvious rash, lesion, or ulcer.    DATA REVIEW:   CBC  Recent Labs Lab 09/14/16 0357  WBC 5.9  HGB 9.2*  HCT 28.2*  PLT 134*    Chemistries   Recent Labs Lab 09/14/16 0357  NA 137  K 3.8  CL 97*  CO2 35*  GLUCOSE 102*  BUN 16  CREATININE 1.20  CALCIUM 8.3*     Microbiology Results  Results for orders placed or performed during the hospital encounter of 09/13/16  Urine culture     Status: Abnormal   Collection Time: 09/13/16  5:15 PM  Result Value Ref Range Status   Specimen Description URINE, RANDOM  Final   Special Requests NONE  Final   Culture >=100,000 COLONIES/mL SERRATIA MARCESCENS (A)  Final   Report Status 09/16/2016 FINAL  Final   Organism ID, Bacteria SERRATIA MARCESCENS (A)  Final      Susceptibility   Serratia marcescens - MIC*    CEFAZOLIN >=64 RESISTANT Resistant     CEFTRIAXONE <=1 SENSITIVE Sensitive     CIPROFLOXACIN <=0.25 SENSITIVE Sensitive     GENTAMICIN <=1 SENSITIVE Sensitive     NITROFURANTOIN 256 RESISTANT Resistant     TRIMETH/SULFA <=20 SENSITIVE Sensitive     * >=100,000 COLONIES/mL SERRATIA MARCESCENS    RADIOLOGY:  No results found.   Management plans discussed with the patient, family and they are in agreement.  CODE STATUS:     Code Status Orders        Start     Ordered   09/13/16 2230  Full code  Continuous     09/13/16 2229    Code Status History    Date Active Date Inactive Code Status Order ID Comments  User Context   09/09/2016  9:11 AM 09/10/2016  4:32 PM Full Code [id number]  Nada LibmanVance W Brabham, MD Inpatient   08/20/2016  2:06 AM 08/23/2016  9:38 PM Full Code [id number]  Alexis Hugelmeyer, DO Inpatient   07/16/2016  1:12 PM 07/27/2016  8:18 PM Full Code [id number]  Erin FullingKurian Kasa, MD ED   03/09/2016  5:20 PM 03/15/2016 10:06 PM Full Code [id number]  Heide SparkAngel King Inpatient   03/03/2016  6:09 PM 03/09/2016  4:04 PM Full Code [id number]  Erin FullingKurian Kasa, MD ED   02/27/2016 10:41 PM 02/29/2016  7:53 PM Full Code [id number]  Oralia Manisavid Willis, MD Inpatient   07/22/2015  7:30 PM 07/26/2015 12:16 AM Full Code [id number]  Adrian SaranSital Mody, MD Inpatient   02/11/2015 10:28 AM 02/11/2015  6:15 PM Full Code [id number]  Nada LibmanVance W Brabham, MD Inpatient   08/04/2014  8:13 PM 08/13/2014  2:53 PM Full Code [id number]  Lynden OxfordPranav Patel, MD Inpatient    Advance Directive Documentation     Most Recent Value  Type of Advance Directive  Living will  Pre-existing out of facility DNR order (yellow form or pink MOST form)  -  "MOST" Form in Place?  -      TOTAL TIME TAKING CARE OF THIS PATIENT: 36 minutes.    Enid BaasKALISETTI,Kenyada Dosch M.D on 09/16/2016 at 3:22 PM  Between 7am to 6pm - Pager - [phone]  After 6pm go to [email or web address] - Social research officer, governmentpassword [email or web address] [email or web address]  [facility] [place] [facility]  Office  [phone]  CC: Primary care physician; Dorothey BasemanAVID BRONSTEIN, MD   Note: This dictation was prepared with Dragon dictation along with smaller phrase technology. Any transcriptional errors that result from this process are unintentional.

## 2016-09-21 ENCOUNTER — Ambulatory Visit: Payer: Medicare Other | Admitting: Family Medicine

## 2016-09-23 NOTE — Consult Note (Signed)
NAMEBHAVIK, CABINESS                 ACCOUNT NO.:  [id number]  MEDICAL RECORD NO.:  [id number]  LOCATION:  [facility]                         FACILITY:  [facility]  PHYSICIAN:  Ahkeem Goede K. Dontrae Morini, M.D.DATE OF BIRTH:  09/01/1945  DATE OF CONSULTATION: DATE OF DISCHARGE:                                CONSULTATION   EXAMINATION:  Intracranial interpretation of right common carotid arteriogram.  FINDINGS:  The right common carotid artery injection demonstrates the right internal carotid artery in the distal cervical and petrous segments to be widely patent.  There is a moderate caliber irregularity of the proximal cavernous segment just distal to the petrous cavernous junction.  Just distal to this there is a lobulated double density best seen on the lateral projection.  This measures approximately 3.5-4 mm.  Distal to this, the distal cavernous and supraclinoid segments opacify widely.  The right middle cerebral artery and the right anterior cerebral artery opacify into the capillary and the venous phases.  Suggestion of mild narrowing of the right middle cerebral artery distribution proximally.  IMPRESSION:  Mild atherosclerotic irregularity involving the proximal cavernous segment of the right internal carotid artery.  Approximately 3.5 mm x 4 mm lobulated double density on the lateral projection in the proximal cavernous segment.  This is inadequately visualized in the AP projection provided.  This may represent an aneurysm.  Further evaluation with a CT angiogram or an MRA of the brain may be considered.          ______________________________ Grandville Silos Corliss Skains, M.D.     SKD/MEDQ  D:  09/22/2016  T:  09/22/2016  Job:  [id number]

## 2016-09-26 ENCOUNTER — Inpatient Hospital Stay: Payer: Medicare Other

## 2016-09-26 ENCOUNTER — Emergency Department: Payer: Medicare Other

## 2016-09-26 ENCOUNTER — Inpatient Hospital Stay
Admission: EM | Admit: 2016-09-26 | Discharge: 2016-09-28 | DRG: 189 | Disposition: A | Discharge status: Home / Self Care | Payer: Medicare Other | Attending: Internal Medicine | Admitting: Internal Medicine

## 2016-09-26 ENCOUNTER — Encounter: Payer: Self-pay | Admitting: Emergency Medicine

## 2016-09-26 DIAGNOSIS — Z87891 Personal history of nicotine dependence: Secondary | ICD-10-CM

## 2016-09-26 DIAGNOSIS — G8929 Other chronic pain: Secondary | ICD-10-CM | POA: Diagnosis present

## 2016-09-26 DIAGNOSIS — G4730 Sleep apnea, unspecified: Secondary | ICD-10-CM | POA: Diagnosis present

## 2016-09-26 DIAGNOSIS — J441 Chronic obstructive pulmonary disease with (acute) exacerbation: Secondary | ICD-10-CM | POA: Diagnosis present

## 2016-09-26 DIAGNOSIS — R410 Disorientation, unspecified: Secondary | ICD-10-CM | POA: Diagnosis not present

## 2016-09-26 DIAGNOSIS — J9602 Acute respiratory failure with hypercapnia: Secondary | ICD-10-CM | POA: Diagnosis present

## 2016-09-26 DIAGNOSIS — G4710 Hypersomnia, unspecified: Secondary | ICD-10-CM | POA: Diagnosis present

## 2016-09-26 DIAGNOSIS — Z79899 Other long term (current) drug therapy: Secondary | ICD-10-CM

## 2016-09-26 DIAGNOSIS — Z9181 History of falling: Secondary | ICD-10-CM

## 2016-09-26 DIAGNOSIS — N170 Acute kidney failure with tubular necrosis: Secondary | ICD-10-CM | POA: Diagnosis present

## 2016-09-26 DIAGNOSIS — I5033 Acute on chronic diastolic (congestive) heart failure: Secondary | ICD-10-CM | POA: Diagnosis present

## 2016-09-26 DIAGNOSIS — G9341 Metabolic encephalopathy: Secondary | ICD-10-CM | POA: Diagnosis present

## 2016-09-26 DIAGNOSIS — J9622 Acute and chronic respiratory failure with hypercapnia: Principal | ICD-10-CM | POA: Diagnosis present

## 2016-09-26 DIAGNOSIS — M542 Cervicalgia: Secondary | ICD-10-CM | POA: Diagnosis present

## 2016-09-26 DIAGNOSIS — Z79891 Long term (current) use of opiate analgesic: Secondary | ICD-10-CM | POA: Diagnosis not present

## 2016-09-26 DIAGNOSIS — Z7952 Long term (current) use of systemic steroids: Secondary | ICD-10-CM | POA: Diagnosis not present

## 2016-09-26 DIAGNOSIS — I110 Hypertensive heart disease with heart failure: Secondary | ICD-10-CM | POA: Diagnosis present

## 2016-09-26 DIAGNOSIS — Z9981 Dependence on supplemental oxygen: Secondary | ICD-10-CM

## 2016-09-26 DIAGNOSIS — G629 Polyneuropathy, unspecified: Secondary | ICD-10-CM | POA: Diagnosis present

## 2016-09-26 DIAGNOSIS — F29 Unspecified psychosis not due to a substance or known physiological condition: Secondary | ICD-10-CM | POA: Diagnosis present

## 2016-09-26 DIAGNOSIS — N179 Acute kidney failure, unspecified: Secondary | ICD-10-CM

## 2016-09-26 DIAGNOSIS — Z7982 Long term (current) use of aspirin: Secondary | ICD-10-CM | POA: Diagnosis not present

## 2016-09-26 DIAGNOSIS — K920 Hematemesis: Secondary | ICD-10-CM

## 2016-09-26 DIAGNOSIS — I959 Hypotension, unspecified: Secondary | ICD-10-CM | POA: Diagnosis present

## 2016-09-26 DIAGNOSIS — Z7951 Long term (current) use of inhaled steroids: Secondary | ICD-10-CM | POA: Diagnosis not present

## 2016-09-26 DIAGNOSIS — I4510 Unspecified right bundle-branch block: Secondary | ICD-10-CM | POA: Diagnosis present

## 2016-09-26 DIAGNOSIS — G9340 Encephalopathy, unspecified: Secondary | ICD-10-CM | POA: Diagnosis not present

## 2016-09-26 DIAGNOSIS — W19XXXA Unspecified fall, initial encounter: Secondary | ICD-10-CM

## 2016-09-26 DIAGNOSIS — W1830XA Fall on same level, unspecified, initial encounter: Secondary | ICD-10-CM | POA: Diagnosis present

## 2016-09-26 DIAGNOSIS — Z931 Gastrostomy status: Secondary | ICD-10-CM

## 2016-09-26 DIAGNOSIS — Z888 Allergy status to other drugs, medicaments and biological substances status: Secondary | ICD-10-CM | POA: Diagnosis not present

## 2016-09-26 DIAGNOSIS — Z981 Arthrodesis status: Secondary | ICD-10-CM

## 2016-09-26 DIAGNOSIS — J9621 Acute and chronic respiratory failure with hypoxia: Secondary | ICD-10-CM | POA: Diagnosis present

## 2016-09-26 DIAGNOSIS — Z930 Tracheostomy status: Secondary | ICD-10-CM

## 2016-09-26 DIAGNOSIS — Z881 Allergy status to other antibiotic agents status: Secondary | ICD-10-CM | POA: Diagnosis not present

## 2016-09-26 LAB — BLOOD GAS, VENOUS
Acid-Base Excess: 10.5 mmol/L — ABNORMAL HIGH (ref 0.0–2.0)
Bicarbonate: 38.2 mmol/L — ABNORMAL HIGH (ref 20.0–28.0)
O2 Saturation: 93.3 %
PATIENT TEMPERATURE: 37
PH VEN: 7.37 (ref 7.250–7.430)
PO2 VEN: 70 mmHg — AB (ref 32.0–45.0)
pCO2, Ven: 66 mmHg — ABNORMAL HIGH (ref 44.0–60.0)

## 2016-09-26 LAB — COMPREHENSIVE METABOLIC PANEL
ALBUMIN: 3.8 g/dL (ref 3.5–5.0)
ALK PHOS: 44 U/L (ref 38–126)
ALT: 17 U/L (ref 17–63)
ANION GAP: 7 (ref 5–15)
AST: 22 U/L (ref 15–41)
BILIRUBIN TOTAL: 1.3 mg/dL — AB (ref 0.3–1.2)
BUN: 42 mg/dL — ABNORMAL HIGH (ref 6–20)
CO2: 33 mmol/L — AB (ref 22–32)
CREATININE: 2.09 mg/dL — AB (ref 0.61–1.24)
Calcium: 8.6 mg/dL — ABNORMAL LOW (ref 8.9–10.3)
Chloride: 94 mmol/L — ABNORMAL LOW (ref 101–111)
GFR calc Af Amer: 36 mL/min — ABNORMAL LOW (ref >60)
GFR calc non Af Amer: 31 mL/min — ABNORMAL LOW (ref >60)
Glucose, Bld: 119 mg/dL — ABNORMAL HIGH (ref 65–99)
Potassium: 4 mmol/L (ref 3.5–5.1)
Sodium: 134 mmol/L — ABNORMAL LOW (ref 135–145)
Total Protein: 6.5 g/dL (ref 6.5–8.1)

## 2016-09-26 LAB — URINE DRUG SCREEN, QUALITATIVE (ARMC ONLY)
Amphetamines, Ur Screen: NOT DETECTED
BARBITURATES, UR SCREEN: NOT DETECTED
BENZODIAZEPINE, UR SCRN: NOT DETECTED
Cannabinoid 50 Ng, Ur ~~LOC~~: NOT DETECTED
Cocaine Metabolite,Ur ~~LOC~~: NOT DETECTED
MDMA (Ecstasy)Ur Screen: NOT DETECTED
METHADONE SCREEN, URINE: NOT DETECTED
Opiate, Ur Screen: NOT DETECTED
Phencyclidine (PCP) Ur S: NOT DETECTED
TRICYCLIC, UR SCREEN: NOT DETECTED

## 2016-09-26 LAB — URINALYSIS, COMPLETE (UACMP) WITH MICROSCOPIC
Bacteria, UA: NONE SEEN
Bilirubin Urine: NEGATIVE
GLUCOSE, UA: NEGATIVE mg/dL
Hgb urine dipstick: NEGATIVE
KETONES UR: NEGATIVE mg/dL
LEUKOCYTES UA: NEGATIVE
Nitrite: NEGATIVE
PROTEIN: NEGATIVE mg/dL
SQUAMOUS EPITHELIAL / LPF: NONE SEEN
Specific Gravity, Urine: 1.014 (ref 1.005–1.030)
pH: 7 (ref 5.0–8.0)

## 2016-09-26 LAB — LIPASE, BLOOD: Lipase: 15 U/L (ref 11–51)

## 2016-09-26 LAB — MAGNESIUM
MAGNESIUM: 3 mg/dL — AB (ref 1.7–2.4)
MAGNESIUM: 3.6 mg/dL — AB (ref 1.7–2.4)

## 2016-09-26 LAB — CBC
HEMATOCRIT: 36.7 % — AB (ref 40.0–52.0)
HEMOGLOBIN: 11.7 g/dL — AB (ref 13.0–18.0)
MCH: 29.3 pg (ref 26.0–34.0)
MCHC: 31.9 g/dL — ABNORMAL LOW (ref 32.0–36.0)
MCV: 91.9 fL (ref 80.0–100.0)
PLATELETS: 182 10*3/uL (ref 150–440)
RBC: 3.99 MIL/uL — AB (ref 4.40–5.90)
RDW: 17.6 % — ABNORMAL HIGH (ref 11.5–14.5)
WBC: 12 10*3/uL — AB (ref 3.8–10.6)

## 2016-09-26 LAB — HEMOGLOBIN: Hemoglobin: 11.9 g/dL — ABNORMAL LOW (ref 13.0–18.0)

## 2016-09-26 LAB — GLUCOSE, CAPILLARY: GLUCOSE-CAPILLARY: 156 mg/dL — AB (ref 65–99)

## 2016-09-26 LAB — AMMONIA: Ammonia: 13 umol/L (ref 9–35)

## 2016-09-26 MED ORDER — ACETAMINOPHEN 325 MG PO TABS
650.0000 mg | ORAL_TABLET | Freq: Four times a day (QID) | ORAL | Status: DC | PRN
  Administered 2016-09-28: 650 mg via ORAL
  Filled 2016-09-26: qty 2

## 2016-09-26 MED ORDER — QUETIAPINE FUMARATE 25 MG PO TABS: 50.0000 mg | ORAL_TABLET | Freq: Every day | ORAL | Status: DC

## 2016-09-26 MED ORDER — SODIUM CHLORIDE 0.9% FLUSH
3.0000 mL | Freq: Two times a day (BID) | INTRAVENOUS | Status: DC
  Administered 2016-09-26 – 2016-09-27 (×4): 3 mL via INTRAVENOUS

## 2016-09-26 MED ORDER — METHYLPREDNISOLONE SODIUM SUCC 125 MG IJ SOLR
60.0000 mg | Freq: Once | INTRAMUSCULAR | Status: AC
  Administered 2016-09-26: 60 mg via INTRAVENOUS
  Filled 2016-09-26: qty 2

## 2016-09-26 MED ORDER — ALBUTEROL SULFATE (2.5 MG/3ML) 0.083% IN NEBU
5.0000 mg | INHALATION_SOLUTION | Freq: Once | RESPIRATORY_TRACT | Status: AC
  Administered 2016-09-26: 5 mg via RESPIRATORY_TRACT
  Filled 2016-09-26: qty 6

## 2016-09-26 MED ORDER — ALBUTEROL SULFATE (2.5 MG/3ML) 0.083% IN NEBU: 2.5000 mg | INHALATION_SOLUTION | RESPIRATORY_TRACT | Status: DC | PRN

## 2016-09-26 MED ORDER — ONDANSETRON HCL 4 MG/2ML IJ SOLN
4.0000 mg | Freq: Four times a day (QID) | INTRAMUSCULAR | Status: DC | PRN
  Administered 2016-09-26 – 2016-09-27 (×2): 4 mg via INTRAVENOUS
  Filled 2016-09-26 (×2): qty 2

## 2016-09-26 MED ORDER — KETOROLAC TROMETHAMINE 15 MG/ML IJ SOLN
15.0000 mg | Freq: Four times a day (QID) | INTRAMUSCULAR | Status: DC | PRN
  Administered 2016-09-27: 15 mg via INTRAVENOUS
  Filled 2016-09-26 (×2): qty 1

## 2016-09-26 MED ORDER — LORAZEPAM 2 MG/ML IJ SOLN
2.0000 mg | Freq: Once | INTRAMUSCULAR | Status: AC
  Administered 2016-09-26: 2 mg via INTRAVENOUS
  Filled 2016-09-26: qty 1

## 2016-09-26 MED ORDER — SERTRALINE HCL 50 MG PO TABS: 75.0000 mg | ORAL_TABLET | Freq: Every day | ORAL | Status: DC

## 2016-09-26 MED ORDER — LEVOTHYROXINE SODIUM 75 MCG PO TABS
75.0000 ug | ORAL_TABLET | Freq: Every day | ORAL | Status: DC
  Administered 2016-09-27 – 2016-09-28 (×2): 75 ug via ORAL
  Filled 2016-09-26 (×2): qty 1

## 2016-09-26 MED ORDER — FUROSEMIDE 10 MG/ML IJ SOLN
40.0000 mg | Freq: Two times a day (BID) | INTRAMUSCULAR | Status: AC
  Administered 2016-09-26 – 2016-09-27 (×2): 40 mg via INTRAVENOUS
  Filled 2016-09-26 (×2): qty 4

## 2016-09-26 MED ORDER — SENNOSIDES-DOCUSATE SODIUM 8.6-50 MG PO TABS: 1.0000 | ORAL_TABLET | Freq: Every evening | ORAL | Status: DC | PRN

## 2016-09-26 MED ORDER — BUDESONIDE 0.5 MG/2ML IN SUSP
0.5000 mg | Freq: Two times a day (BID) | RESPIRATORY_TRACT | Status: DC
  Administered 2016-09-26 – 2016-09-28 (×5): 0.5 mg via RESPIRATORY_TRACT
  Filled 2016-09-26 (×5): qty 2

## 2016-09-26 MED ORDER — METHYLPREDNISOLONE SODIUM SUCC 125 MG IJ SOLR
60.0000 mg | Freq: Four times a day (QID) | INTRAMUSCULAR | Status: DC
Start: 2016-09-26 — End: 2016-09-28
  Administered 2016-09-26 – 2016-09-28 (×8): 60 mg via INTRAVENOUS
  Filled 2016-09-26 (×7): qty 2

## 2016-09-26 MED ORDER — PANTOPRAZOLE SODIUM 40 MG IV SOLR
40.0000 mg | Freq: Two times a day (BID) | INTRAVENOUS | Status: DC
  Administered 2016-09-26 – 2016-09-27 (×3): 40 mg via INTRAVENOUS
  Filled 2016-09-26 (×3): qty 40

## 2016-09-26 MED ORDER — BISACODYL 5 MG PO TBEC: 5.0000 mg | DELAYED_RELEASE_TABLET | Freq: Every day | ORAL | Status: DC | PRN

## 2016-09-26 MED ORDER — OXYCODONE HCL 5 MG PO TABS
5.0000 mg | ORAL_TABLET | Freq: Three times a day (TID) | ORAL | Status: DC | PRN
  Administered 2016-09-27 (×2): 5 mg via ORAL
  Filled 2016-09-26 (×2): qty 1

## 2016-09-26 MED ORDER — MAGNESIUM SULFATE 2 GM/50ML IV SOLN
2.0000 g | Freq: Once | INTRAVENOUS | Status: AC
  Administered 2016-09-26: 2 g via INTRAVENOUS
  Filled 2016-09-26: qty 50

## 2016-09-26 MED ORDER — SODIUM CHLORIDE 0.9 % IV BOLUS (SEPSIS)
500.0000 mL | Freq: Once | INTRAVENOUS | Status: AC
  Administered 2016-09-26: 500 mL via INTRAVENOUS

## 2016-09-26 MED ORDER — TIOTROPIUM BROMIDE MONOHYDRATE 18 MCG IN CAPS
18.0000 ug | ORAL_CAPSULE | Freq: Every day | RESPIRATORY_TRACT | Status: DC
Start: 2016-09-26 — End: 2016-09-28
  Administered 2016-09-27: 18 ug via RESPIRATORY_TRACT
  Filled 2016-09-26: qty 5

## 2016-09-26 MED ORDER — SODIUM CHLORIDE 0.9 % IV SOLN
INTRAVENOUS | Status: DC
  Administered 2016-09-26: 20:00:00 via INTRAVENOUS

## 2016-09-26 MED ORDER — ONDANSETRON HCL 4 MG PO TABS: 4.0000 mg | ORAL_TABLET | Freq: Four times a day (QID) | ORAL | Status: DC | PRN

## 2016-09-26 MED ORDER — SODIUM CHLORIDE 0.9 % IV SOLN
INTRAVENOUS | Status: DC
  Administered 2016-09-26 – 2016-09-27 (×2): via INTRAVENOUS
  Administered 2016-09-28: 1000 mL via INTRAVENOUS

## 2016-09-26 MED ORDER — ACETAMINOPHEN 650 MG RE SUPP: 650.0000 mg | Freq: Four times a day (QID) | RECTAL | Status: DC | PRN

## 2016-09-26 MED ORDER — IPRATROPIUM-ALBUTEROL 0.5-2.5 (3) MG/3ML IN SOLN
3.0000 mL | Freq: Once | RESPIRATORY_TRACT | Status: AC
  Administered 2016-09-26: 3 mL via RESPIRATORY_TRACT
  Filled 2016-09-26: qty 3

## 2016-09-26 MED ORDER — FOLIC ACID 1 MG PO TABS
1.0000 mg | ORAL_TABLET | Freq: Every day | ORAL | Status: DC
  Administered 2016-09-27 – 2016-09-28 (×2): 1 mg via ORAL
  Filled 2016-09-26 (×2): qty 1

## 2016-09-26 MED ORDER — HYDROCODONE-ACETAMINOPHEN 5-325 MG PO TABS: 1.0000 | ORAL_TABLET | ORAL | Status: DC | PRN

## 2016-09-26 NOTE — Progress Notes (Signed)
PT Cancellation Note  Patient Details Name: Chad Huffman MRN: [id number] DOB: 1946/02/02   Cancelled Treatment:    Reason Eval/Treat Not Completed: Medical issues which prohibited therapy .  Rapid response just called prior to PT arrival and nursing asked PT to hold today.  Will try again tomorrow.  Ivar Drape 09/26/2016, 1:53 PM   Samul Dada, PT MS Acute Rehab Dept. Number: [facility] [id number] and [facility] [phone]

## 2016-09-26 NOTE — ED Triage Notes (Signed)
Pt to rm 12 via EMS from home, report emesis x 1 with small amount of blood.  Report fall earlier, hematoma to top of head, bruise to LLQ, and skin tear to right forearm.  Pt falling asleep during assessment, pt repeating things over and over, difficult to get pt to focus.  Pt in NAD, denies pain.

## 2016-09-26 NOTE — Progress Notes (Signed)
Patient has been very confused; kicking legs in air and making strange sounds and crying. States his "neck hurts bad" and then suddenly begins snoring, sleeping. Patient awoke and again making strange sounds. Nurse and charge sat patient in high fowlers in time since patient began vomiting. Charge nurse & nurse admin contacted patients doctor to advise of condition and asked for MRI and instructions were given.

## 2016-09-26 NOTE — ED Notes (Signed)
Pt heard to be yelling in room when IV magnesium finished and pump was beeping. Pt had pulled out his IV, cardiac leads, and O2 tubing and was yelling to nurse "please let me out of the hospital, I'm okay!" Stopped IV pump alarm and pt immediately fell back asleep.

## 2016-09-26 NOTE — Clinical Social Work Note (Signed)
CSW attempted to provide COPD Gold protocol PHQ-9 and GAD-7. Patient was not alert enough to proceed. Rapid Response called by his RN. CSW will reattempt at another time.  Argentina Ponder, MSW, Theresia Majors [phone]

## 2016-09-26 NOTE — Progress Notes (Signed)
Pt unresponsive

## 2016-09-26 NOTE — ED Provider Notes (Signed)
[facility] Emergency Department Provider Note    First MD Initiated Contact with Patient 09/26/16 0407     (approximate)  I have reviewed the triage vital signs and the nursing notes.   HISTORY  Chief Complaint Emesis and Fall    HPI Chad Huffman is a 71 y.o. male severe COPD as well as chronic neck pain presents with altered mental status and fall from standing because the patient passed out. EMS found the patient drowsy and hypoxic down to 88% but was not wearing his home oxygen which is prescribed to him. Patient states that he has been having more falls over the past few weeks with worsening shortness of breath. Denies any chest pain. No fevers. Denies any throat pain. No nausea.  States he did have one episode of blood-tinged vomiting.   Past Medical History:  Diagnosis Date  . Antalgic gait   . Carotid artery occlusion   . Chronic neck and back pain   . Complication of anesthesia    pt has had cervical fusion-limited neck flextion  . COPD (chronic obstructive pulmonary disease) (HCC)   . Depression   . DJD (degenerative joint disease)    Right shoulder  . Hypertension   . Lumbago   . Peripheral neuropathy (HCC)    Family History  Problem Relation Age of Onset  . Stomach cancer Mother   . Hypertension Brother   . Heart attack Father    Past Surgical History:  Procedure Laterality Date  . ABDOMINAL AORTIC ANEURYSM REPAIR  2009   Endovascular AAA repair  . ABDOMINAL AORTIC ANEURYSM REPAIR    . CAROTID ANGIOGRAPHY  09/09/2016   Procedure: Carotid Angiography;  Surgeon: Nada Libman, MD;  Location: [facility] INVASIVE CV LAB;  Service: Cardiovascular;;  rt. carotid  . CARPAL TUNNEL RELEASE     bilateral CTS  . HAMMER TOE SURGERY  12/10/2011   Procedure: HAMMER TOE CORRECTION;  Surgeon: Ernestene Kiel, DPM;  Location: [facility];  Service: Podiatry;  Laterality: Right;  Right hammertoe repair second right with 2.5x42 orthopro screw   . IR GENERIC HISTORICAL  04/05/2016   IR GASTROSTOMY TUBE MOD SED 04/05/2016 [facility]  . IR GENERIC HISTORICAL  05/12/2016   IR CM INJ ANY COLONIC TUBE W/FLUORO 05/12/2016 Malachy Moan, MD [facility]  . IR GENERIC HISTORICAL  05/26/2016   IR CM INJ ANY COLONIC TUBE W/FLUORO 05/26/2016 [facility]  . IR GENERIC HISTORICAL  05/28/2016   IR REPLC GASTRO/COLONIC TUBE PERCUT W/FLUORO 05/28/2016 Brayton El, PA-C [facility]  . IR GENERIC HISTORICAL  07/02/2016   IR GASTROSTOMY TUBE REMOVAL 07/02/2016 Brayton El, PA-C [facility]  . METATARSAL OSTEOTOMY  12/10/2011   Procedure: METATARSAL OSTEOTOMY;  Surgeon: Ernestene Kiel, DPM;  Location: [facility];  Service: Podiatry;  Laterality: Right;  Right Keller arthroplasty with size 5 silicone implant right great toe; second metatarsal osteotomy with 2.0x14 screw  . NECK SURGERY    . PERIPHERAL VASCULAR CATHETERIZATION N/A 02/11/2015   Procedure: Carotid Angiography;  Surgeon: Nada Libman, MD;  Location: [facility] INVASIVE CV LAB;  Service: Cardiovascular;  Laterality: N/A;  . PILONIDAL CYST / SINUS EXCISION    . Rotary cuff    . SPINE SURGERY  09/15/11   Scar tissue removed- back  . SPINE SURGERY  09/15/11   Scar tissue removed- back  . TONSILLECTOMY    . TRACHEOSTOMY TUBE PLACEMENT N/A 03/03/2016   Procedure: TRACHEOSTOMY;  Surgeon: Geanie Logan,  MD;  Location: [facility];  Service: ENT;  Laterality: N/A;   Patient Active Problem List   Diagnosis Date Noted  . Pressure injury of skin 09/14/2016  . Weakness 09/13/2016  . UTI (urinary tract infection) 09/13/2016  . Chronic diastolic CHF (congestive heart failure) (HCC) 09/13/2016  . COPD (chronic obstructive pulmonary disease) (HCC) 09/13/2016  . Carotid stenosis 09/09/2016  . Carotid artery stenosis 08/23/2016  . Pancytopenia (HCC) 08/23/2016  . Hyperkalemia 08/23/2016  . Acute on chronic diastolic CHF (congestive heart failure) (HCC) 07/27/2016  . Leukocytosis  07/27/2016  . Thrombocytopenia (HCC) 07/27/2016  . Anemia 07/27/2016  . Acute on chronic respiratory failure with hypoxia and hypercapnia (HCC) 07/16/2016  . 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 Dysphagia, pharyngoesophageal phase 05/18/2016  . GERD without esophagitis 05/09/2016  . Bilateral lower extremity edema 05/09/2016  . Slow transit constipation 05/09/2016  . Depression with anxiety 05/09/2016  . Respiratory failure with hypoxia and hypercapnia (HCC) 07/22/2015  . Cervical vertebral fusion   . Depression   . Chronic neck and back pain   . Acute on chronic respiratory failure with hypercapnia (HCC)   . COPD exacerbation (HCC)   . HCAP (healthcare-associated pneumonia)   . Congestive dilated cardiomyopathy (HCC)   . Syncope and collapse   . Arthritis of right hip 08/04/2014  . Essential hypertension 08/04/2014  . Hypothyroidism 08/04/2014  . Syncope 08/04/2014  . Fall 08/04/2014      Prior to Admission medications   Medication Sig Start Date End Date Taking? Authorizing Provider  albuterol (ACCUNEB) 1.25 MG/3ML nebulizer solution Take 1 ampule by nebulization every 4 (four) hours as needed for wheezing.    Historical Provider, MD  aspirin EC 81 MG tablet Take 81 mg by mouth daily.    Historical Provider, MD  budesonide (PULMICORT) 0.5 MG/2ML nebulizer solution Take 0.5 mg by nebulization 2 (two) times daily.    Historical Provider, MD  ciprofloxacin (CIPRO) 500 MG tablet Take 1 tablet (500 mg total) by mouth 2 (two) times daily. X 6 more days 09/16/16   Enid Baas, MD  clonazePAM (KLONOPIN) 0.5 MG tablet Take 1 tablet (0.5 mg total) by mouth 2 (two) times daily. 08/23/16   Katharina Caper, MD  folic acid (FOLVITE) 1 MG tablet Take 1 mg by mouth daily.     Historical Provider, MD  levothyroxine (SYNTHROID, LEVOTHROID) 75 MCG tablet Take 1 tablet (75 mcg total) by mouth daily before breakfast. 09/16/16   Enid Baas, MD  lisinopril (PRINIVIL,ZESTRIL) 10 MG tablet Take 1 tablet (10 mg total) by mouth at bedtime. 09/16/16   Enid Baas, MD  metoCLOPramide (REGLAN) 5 MG tablet Take 2.5 mg by mouth every 6 (six) hours.     Historical Provider, MD  metoprolol tartrate (LOPRESSOR) 25 MG tablet Take 1 tablet (25 mg total) by mouth 2 (two) times daily. 07/27/16   Katharina Caper, MD  Multiple Vitamin (MULTIVITAMIN WITH MINERALS) TABS tablet Take 1 tablet by mouth daily.    Historical Provider, MD  oxyCODONE (OXY IR/ROXICODONE) 5 MG immediate release tablet Take 1 tablet (5 mg total) by mouth every 8 (eight) hours as needed for moderate pain. 09/16/16   Enid Baas, MD  OXYGEN 4 L/min by Intratracheal route as needed (for shortness of breath).     Historical Provider, MD  predniSONE (DELTASONE) 5 MG tablet Take 1 tablet by mouth daily. 07/06/16   Historical Provider, MD  QUEtiapine (SEROQUEL) 50 MG tablet Take 50  mg by mouth at bedtime.    Historical Provider, MD  sertraline (ZOLOFT) 50 MG tablet Take 75 mg by mouth daily.     Historical Provider, MD  SYMBICORT 160-4.5 MCG/ACT inhaler Inhale 2 puffs into the lungs 2 (two) times daily. 08/04/16   Historical Provider, MD  tiotropium (SPIRIVA) 18 MCG inhalation capsule Place 1 capsule (18 mcg total) into inhaler and inhale daily. 07/28/16   Katharina Caper, MD  torsemide (DEMADEX) 20 MG tablet Take 20 mg by mouth daily.     Historical Provider, MD    Allergies Ampicillin; Nortriptyline; Brimonidine; Cyclobenzaprine; Tizanidine; and Gabapentin    Social History Social History  Substance Use Topics  . Smoking status: Former Smoker    Quit date: 08/30/2006  . Smokeless tobacco: Never Used  . Alcohol use 1.2 oz/week    2 Cans of beer per week     Comment: occasional use    Review of Systems Patient denies headaches, rhinorrhea, blurry vision, numbness, shortness of breath, chest pain, edema, cough, abdominal pain, nausea, vomiting, diarrhea, dysuria,  fevers, rashes or hallucinations unless otherwise stated above in HPI. ____________________________________________   PHYSICAL EXAM:  VITAL SIGNS: Vitals:   09/26/16 0352  BP: (!) 139/98  Pulse: 67  Resp: 16  Temp: 97.7 F (36.5 C)    Constitutional: drowsy, awakens to painful stimuli and once awake can provide history but quickly doses off Eyes: Conjunctivae are normal. PERRL. EOMI. Head: contusion to forehead Nose: No congestion/rhinnorhea. Mouth/Throat: Mucous membranes are moist.  Oropharynx non-erythematous. Neck: No stridor. Painless ROM. No cervical spine tenderness to palpation Hematological/Lymphatic/Immunilogical: No cervical lymphadenopathy. Cardiovascular: Normal rate, regular rhythm. Grossly normal heart sounds.  Good peripheral circulation. Respiratory: Normal respiratory effort.  No retractions. Lungs with diminished breathsounds throughout Gastrointestinal: Soft and nontender. No distention. No abdominal bruits. No CVA tenderness. Musculoskeletal: No lower extremity tenderness nor edema.  No joint effusions. Neurologic:  Normal speech and language. No gross focal neurologic deficits are appreciated. No gait instability. Skin:  Skin is warm, dry and intact. Superficial skin tear to RUE Psychiatric: Mood and affect are normal. Speech and behavior are normal.  ____________________________________________   LABS (all labs ordered are listed, but only abnormal results are displayed)  Results for orders placed or performed during the hospital encounter of 09/26/16 (from the past 24 hour(s))  Lipase, blood     Status: None   Collection Time: 09/26/16  4:04 AM  Result Value Ref Range   Lipase 15 11 - 51 U/L  Comprehensive metabolic panel     Status: Abnormal   Collection Time: 09/26/16  4:04 AM  Result Value Ref Range   Sodium 134 (L) 135 - 145 mmol/L   Potassium 4.0 3.5 - 5.1 mmol/L   Chloride 94 (L) 101 - 111 mmol/L   CO2 33 (H) 22 - 32 mmol/L   Glucose, Bld  119 (H) 65 - 99 mg/dL   BUN 42 (H) 6 - 20 mg/dL   Creatinine, Ser 1.61 (H) 0.61 - 1.24 mg/dL   Calcium 8.6 (L) 8.9 - 10.3 mg/dL   Total Protein 6.5 6.5 - 8.1 g/dL   Albumin 3.8 3.5 - 5.0 g/dL   AST 22 15 - 41 U/L   ALT 17 17 - 63 U/L   Alkaline Phosphatase 44 38 - 126 U/L   Total Bilirubin 1.3 (H) 0.3 - 1.2 mg/dL   GFR calc non Af Amer 31 (L) >60 mL/min   GFR calc Af Amer 36 (L) >60 mL/min  Anion gap 7 5 - 15  CBC     Status: Abnormal   Collection Time: 09/26/16  4:04 AM  Result Value Ref Range   WBC 12.0 (H) 3.8 - 10.6 K/uL   RBC 3.99 (L) 4.40 - 5.90 MIL/uL   Hemoglobin 11.7 (L) 13.0 - 18.0 g/dL   HCT 35.0 (L) 09.3 - 81.8 %   MCV 91.9 80.0 - 100.0 fL   MCH 29.3 26.0 - 34.0 pg   MCHC 31.9 (L) 32.0 - 36.0 g/dL   RDW 29.9 (H) 37.1 - 69.6 %   Platelets 182 150 - 440 K/uL  Blood gas, venous     Status: Abnormal   Collection Time: 09/26/16  4:24 AM  Result Value Ref Range   pH, Ven 7.37 7.250 - 7.430   pCO2, Ven 66 (H) 44.0 - 60.0 mmHg   pO2, Ven 70.0 (H) 32.0 - 45.0 mmHg   Bicarbonate 38.2 (H) 20.0 - 28.0 mmol/L   Acid-Base Excess 10.5 (H) 0.0 - 2.0 mmol/L   O2 Saturation 93.3 %   Patient temperature 37.0    Collection site LINE    Sample type VENOUS   Urinalysis, Complete w Microscopic     Status: Abnormal   Collection Time: 09/26/16  4:36 AM  Result Value Ref Range   Color, Urine YELLOW (A) YELLOW   APPearance CLEAR (A) CLEAR   Specific Gravity, Urine 1.014 1.005 - 1.030   pH 7.0 5.0 - 8.0   Glucose, UA NEGATIVE NEGATIVE mg/dL   Hgb urine dipstick NEGATIVE NEGATIVE   Bilirubin Urine NEGATIVE NEGATIVE   Ketones, ur NEGATIVE NEGATIVE mg/dL   Protein, ur NEGATIVE NEGATIVE mg/dL   Nitrite NEGATIVE NEGATIVE   Leukocytes, UA NEGATIVE NEGATIVE   RBC / HPF 0-5 0 - 5 RBC/hpf   WBC, UA 0-5 0 - 5 WBC/hpf   Bacteria, UA NONE SEEN NONE SEEN   Squamous Epithelial / LPF NONE SEEN NONE SEEN  Urine Drug Screen, Qualitative ([facility] only)     Status: None   Collection Time: 09/26/16   4:36 AM  Result Value Ref Range   Tricyclic, Ur Screen NONE DETECTED NONE DETECTED   Amphetamines, Ur Screen NONE DETECTED NONE DETECTED   MDMA (Ecstasy)Ur Screen NONE DETECTED NONE DETECTED   Cocaine Metabolite,Ur [place] NONE DETECTED NONE DETECTED   Opiate, Ur Screen NONE DETECTED NONE DETECTED   Phencyclidine (PCP) Ur S NONE DETECTED NONE DETECTED   Cannabinoid 50 Ng, Ur [place] NONE DETECTED NONE DETECTED   Barbiturates, Ur Screen NONE DETECTED NONE DETECTED   Benzodiazepine, Ur Scrn NONE DETECTED NONE DETECTED   Methadone Scn, Ur NONE DETECTED NONE DETECTED   ____________________________________________  EKG My review and personal interpretation at Time: 8:02   Indication: chf  Rate: 75  Rhythm: sinus Axis: normal Other: icomplete rbbb, no stemi, normal intervals ____________________________________________  RADIOLOGY  I personally reviewed all radiographic images ordered to evaluate for the above acute complaints and reviewed radiology reports and findings.  These findings were personally discussed with the patient.  Please see medical record for radiology report. _______________________________________   PROCEDURES  Procedure(s) performed:  Procedures    Critical Care performed: yes CRITICAL CARE Performed by: Willy Eddy   Total critical care time: 45 minutes  Critical care time was exclusive of separately billable procedures and treating other patients.  Critical care was necessary to treat or prevent imminent or life-threatening deterioration.  Critical care was time spent personally by me on the following activities: development of treatment  plan with patient and/or surrogate as well as nursing, discussions with consultants, evaluation of patient's response to treatment, examination of patient, obtaining history from patient or surrogate, ordering and performing treatments and interventions, ordering and review of laboratory studies, ordering and review of  radiographic studies, pulse oximetry and re-evaluation of patient's condition.  ____________________________________________   INITIAL IMPRESSION / ASSESSMENT AND PLAN / ED COURSE  Pertinent labs & imaging results that were available during my care of the patient were reviewed by me and considered in my medical decision making (see chart for details).  DDX: Dehydration, sepsis, pna, uti, hypoglycemia, cva, drug effect, withdrawal, encephalitis   Chad Huffman is a 71 y.o. who presents to the ED with very bizarre presentation. Patient with multiple underlying comorbidities. Patient afebrile and appears well-perfused. Given his cardiac and respiratory history. There is some component of underlying processes likely COPD or congestive heart failure. Will order blood work and radiographic imaging to evaluate for acute abnormality. We'll order CT imaging to evaluate for any evidence of head bleed. Patient currently protecting his airway. Will give nebulizer treatment for diminished breath sounds as I am concerned for acute hypercapnia  Clinical Course as of Sep 26 644  Sun Sep 26, 2016  1610 CT imaging with no acute abnormality. I do suspect that large part of the patient's underlying encephalopathy secondary to underlying COPD. 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 Do not find any evidence of sepsis. I do feel patient will require admission for further evaluation and management.  [PR]    Clinical Course User Index [PR] Willy Eddy, MD     ____________________________________________   FINAL CLINICAL IMPRESSION(S) / ED DIAGNOSES  Final diagnoses:  Acute respiratory failure with hypercapnia (HCC)  Acute encephalopathy  AKI (acute kidney injury) (HCC)      NEW MEDICATIONS STARTED DURING THIS VISIT:  New Prescriptions   No medications on file     Note:  This document was prepared using Dragon voice recognition  software and may include unintentional dictation errors.    Willy Eddy, MD 09/26/16 [phone]

## 2016-09-26 NOTE — Progress Notes (Addendum)
Nutrition Brief Note  RD consulted for COPD Gold  Wt Readings from Last 15 Encounters:  09/26/16 208 lb (94.3 kg)  09/13/16 201 lb 12.8 oz (91.5 kg)  09/09/16 208 lb (94.3 kg)  08/30/16 209 lb (94.8 kg)  08/20/16 200 lb 6 oz (90.9 kg)  08/16/16 208 lb (94.3 kg)  07/27/16 214 lb 6.4 oz (97.3 kg)  07/05/16 203 lb (92.1 kg)  05/26/16 198 lb (89.8 kg)  05/17/16 202 lb (91.6 kg)  05/07/16 201 lb 4 oz (91.3 kg)  04/28/16 201 lb 4 oz (91.3 kg)  04/19/16 208 lb 6.4 oz (94.5 kg)  04/16/16 241 lb (109.3 kg)  03/08/16 241 lb 2.9 oz (109.4 kg)    Body mass index is 31.63 kg/m. Patient meets criteria for obese class I based on current BMI.   Current diet order is NPO, patient is consuming approximately no meals at this time. Labs and medications reviewed.   Patient has a history of a trach/PEG - PEG removed in January. Weight stable. PO good upon previous admission. Patient was lethargic, would not wake up for me today. Will follow-up for nutrition needs w/ diet advancement  No nutrition interventions warranted at this time. If nutrition issues arise, please consult RD.   Dionne Ano. Cortny Bambach, MS, RD LDN Inpatient Clinical Dietitian Pager [phone]

## 2016-09-26 NOTE — Progress Notes (Signed)
Called rapid response at 1312. Patient very lathargic and cannot not stay awake.   Paged Dr. Imogene Burn at [phone]. He returned page but nurse did not recv for some reason.   Rapid team assessed: Labs for mag level at 0404 was 3.0; at 0650 2g mag iv was given.  VS stable: blood gas drawn by RT at 1320.  Re-paged at [phone] received call back [phone]. Dr. Imogene Burn placed orders for this patient following conversation with this nurse.

## 2016-09-26 NOTE — H&P (Signed)
[facility] [place] [facility]   PATIENT NAME: Chad Huffman    MR#:  [id number]  DATE OF BIRTH:  02-Mar-1946  DATE OF ADMISSION:  09/26/2016  PRIMARY CARE PHYSICIAN: Dorothey Baseman, MD   REQUESTING/REFERRING PHYSICIAN: Willy Eddy, MD  CHIEF COMPLAINT:   Chief Complaint  Patient presents with  . Emesis  . Fall   Emesis fall today. HISTORY OF PRESENT ILLNESS:  Chad Huffman  is a 72 y.o. male with a known history of COPD, chronic respiratory failure on home oxygen 2 L, carotid artery occlusion, hypertension, chronic neck pain and peripheral neuropathy. The patient was sent to ED due to above chief complaint. The patient is very drowsy and lethargic, unable to provide information. According to the ED physician, the patient fall from standing because the patient passed out. EMS found the patient drowsy and hypoxia down to 88%. The patient states that he has been having more falls over the past few weeks with worsening shortness of breath.  States he did have one episode of blood-tinged vomiting.The patient is very drowsy now, unable to get ROS. But the patient denies any chest pain. Chest x-ray didn't show any acute bleed. Urinalysis is normal. But the creatinine is up to 2.9. He is being treated with albuterol, Solu-Medrol and IV magnesium in the ED.  PAST MEDICAL HISTORY:   Past Medical History:  Diagnosis Date  . Antalgic gait   . Carotid artery occlusion   . Chronic neck and back pain   . Complication of anesthesia    pt has had cervical fusion-limited neck flextion  . COPD (chronic obstructive pulmonary disease) (HCC)   . Depression   . DJD (degenerative joint disease)    Right shoulder  . Hypertension   . Lumbago   . Peripheral neuropathy (HCC)     PAST SURGICAL HISTORY:   Past Surgical History:  Procedure Laterality Date  . ABDOMINAL AORTIC ANEURYSM REPAIR  2009   Endovascular AAA repair  . ABDOMINAL AORTIC ANEURYSM REPAIR    . CAROTID  ANGIOGRAPHY  09/09/2016   Procedure: Carotid Angiography;  Surgeon: Nada Libman, MD;  Location: [facility] INVASIVE CV LAB;  Service: Cardiovascular;;  rt. carotid  . CARPAL TUNNEL RELEASE     bilateral CTS  . HAMMER TOE SURGERY  12/10/2011   Procedure: HAMMER TOE CORRECTION;  Surgeon: Ernestene Kiel, DPM;  Location: [facility];  Service: Podiatry;  Laterality: Right;  Right hammertoe repair second right with 2.5x42 orthopro screw   . IR GENERIC HISTORICAL  04/05/2016   IR GASTROSTOMY TUBE MOD SED 04/05/2016 [facility]  . IR GENERIC HISTORICAL  05/12/2016   IR CM INJ ANY COLONIC TUBE W/FLUORO 05/12/2016 Malachy Moan, MD [facility]  . IR GENERIC HISTORICAL  05/26/2016   IR CM INJ ANY COLONIC TUBE W/FLUORO 05/26/2016 [facility]  . IR GENERIC HISTORICAL  05/28/2016   IR REPLC GASTRO/COLONIC TUBE PERCUT W/FLUORO 05/28/2016 Brayton El, PA-C [facility]  . IR GENERIC HISTORICAL  07/02/2016   IR GASTROSTOMY TUBE REMOVAL 07/02/2016 Brayton El, PA-C [facility]  . METATARSAL OSTEOTOMY  12/10/2011   Procedure: METATARSAL OSTEOTOMY;  Surgeon: Ernestene Kiel, DPM;  Location: [facility];  Service: Podiatry;  Laterality: Right;  Right Keller arthroplasty with size 5 silicone implant right great toe; second metatarsal osteotomy with 2.0x14 screw  . NECK SURGERY    . PERIPHERAL VASCULAR CATHETERIZATION N/A 02/11/2015   Procedure: Carotid Angiography;  Surgeon: Nada Libman, MD;  Location: [facility] INVASIVE CV LAB;  Service: Cardiovascular;  Laterality: N/A;  . PILONIDAL CYST / SINUS EXCISION    . Rotary cuff    . SPINE SURGERY  09/15/11   Scar tissue removed- back  . SPINE SURGERY  09/15/11   Scar tissue removed- back  . TONSILLECTOMY    . TRACHEOSTOMY TUBE PLACEMENT N/A 03/03/2016   Procedure: TRACHEOSTOMY;  Surgeon: Geanie Logan, MD;  Location: [facility] ORS;  Service: ENT;  Laterality: N/A;    SOCIAL HISTORY:   Social History  Substance Use Topics  . Smoking status: Former  Smoker    Quit date: 08/30/2006  . Smokeless tobacco: Never Used  . Alcohol use 1.2 oz/week    2 Cans of beer per week     Comment: occasional use    FAMILY HISTORY:   Family History  Problem Relation Age of Onset  . Stomach cancer Mother   . Hypertension Brother   . Heart attack Father     DRUG ALLERGIES:   Allergies  Allergen Reactions  . Ampicillin Swelling and Other (See Comments)    Unable to obtain enough information to answer additional questions about this medication.  Has patient had a PCN reaction causing immediate rash, facial/tongue/throat swelling, SOB or lightheadedness with hypotension: Yes Has patient had a PCN reaction causing severe rash involving mucus membranes or skin necrosis: No Has patient had a PCN reaction that required hospitalization No Has patient had a PCN reaction occurring within the last 10 years: Yes If all of the above answers are "NO", then may pr  . Nortriptyline Other (See Comments)    Sleep walking  . Brimonidine Other (See Comments)    Unknown reaction per MAR   . Cyclobenzaprine Other (See Comments)    Hallucinations  . Tizanidine Other (See Comments)    Hallucinations  . Gabapentin Rash    REVIEW OF SYSTEMS:   Review of Systems  Unable to perform ROS: Mental status change    MEDICATIONS AT HOME:   Prior to Admission medications   Medication Sig Start Date End Date Taking? Authorizing Provider  albuterol (ACCUNEB) 1.25 MG/3ML nebulizer solution Take 1 ampule by nebulization every 4 (four) hours as needed for wheezing.   Yes Historical Provider, MD  aspirin EC 81 MG tablet Take 81 mg by mouth daily.   Yes Historical Provider, MD  baclofen (LIORESAL) 20 MG tablet Take 20 mg by mouth 3 (three) times daily.   Yes Historical Provider, MD  budesonide (PULMICORT) 0.5 MG/2ML nebulizer solution Take 0.5 mg by nebulization 2 (two) times daily.   Yes Historical Provider, MD  clonazePAM (KLONOPIN) 0.5 MG tablet Take 1 tablet (0.5 mg  total) by mouth 2 (two) times daily. 08/23/16  Yes Katharina Caper, MD  clopidogrel (PLAVIX) 75 MG tablet Take 75 mg by mouth daily.   Yes Historical Provider, MD  folic acid (FOLVITE) 1 MG tablet Take 1 mg by mouth daily.    Yes Historical Provider, MD  levothyroxine (SYNTHROID, LEVOTHROID) 75 MCG tablet Take 1 tablet (75 mcg total) by mouth daily before breakfast. 09/16/16  Yes Enid Baas, MD  lisinopril (PRINIVIL,ZESTRIL) 10 MG tablet Take 1 tablet (10 mg total) by mouth at bedtime. 09/16/16  Yes Enid Baas, MD  metoCLOPramide (REGLAN) 5 MG tablet Take 2.5 mg by mouth every 6 (six) hours.    Yes Historical Provider, MD  metoprolol tartrate (LOPRESSOR) 25 MG tablet Take 1 tablet (25 mg total) by mouth 2 (two) times daily. 07/27/16  Yes  Katharina Caper, MD  Multiple Vitamin (MULTIVITAMIN WITH MINERALS) TABS tablet Take 1 tablet by mouth daily.   Yes Historical Provider, MD  oxyCODONE (OXY IR/ROXICODONE) 5 MG immediate release tablet Take 1 tablet (5 mg total) by mouth every 8 (eight) hours as needed for moderate pain. 09/16/16  Yes Enid Baas, MD  OXYGEN 4 L/min by Intratracheal route as needed (for shortness of breath).    Yes Historical Provider, MD  predniSONE (DELTASONE) 5 MG tablet Take 1 tablet by mouth daily. 07/06/16  Yes Historical Provider, MD  QUEtiapine (SEROQUEL) 50 MG tablet Take 50 mg by mouth at bedtime.   Yes Historical Provider, MD  sertraline (ZOLOFT) 50 MG tablet Take 75 mg by mouth daily.    Yes Historical Provider, MD  SYMBICORT 160-4.5 MCG/ACT inhaler Inhale 2 puffs into the lungs 2 (two) times daily. 08/04/16  Yes Historical Provider, MD  tiotropium (SPIRIVA) 18 MCG inhalation capsule Place 1 capsule (18 mcg total) into inhaler and inhale daily. 07/28/16  Yes Katharina Caper, MD  torsemide (DEMADEX) 20 MG tablet Take 20 mg by mouth daily.    Yes Historical Provider, MD      VITAL SIGNS:  Blood pressure (!) 104/48, pulse 74, temperature 97.7 F (36.5 C), temperature  source Oral, resp. rate 11, height  (1.727 m), weight 208 lb (94.3 kg), SpO2 100 %.  PHYSICAL EXAMINATION:  Physical Exam  Constitutional: He is well-developed, well-nourished, and in no distress.  HENT:  Mouth/Throat: Oropharynx is clear and moist.  Eyes: Conjunctivae and EOM are normal.  Bilateral pupils 2  Mm, not reaction to light.  Neck: Normal range of motion. Neck supple. No JVD present. No tracheal deviation present.  Cardiovascular: Normal rate, regular rhythm and normal heart sounds.  Exam reveals no gallop.   No murmur heard. Pulmonary/Chest: Effort normal. No respiratory distress. He has no wheezes. He has no rales.  Very diminished breath sounds.  Abdominal: Soft. Bowel sounds are normal. He exhibits no distension. There is no tenderness. There is no rebound.  Musculoskeletal: He exhibits no edema or tenderness.  Neurological:  The patient is lethargic and drowsy, does not follow commands, unable to exam.  Psychiatric:  The patient is lethargic and drowsy    LABORATORY PANEL:   CBC  Recent Labs Lab 09/26/16 0404  WBC 12.0*  HGB 11.7*  HCT 36.7*  PLT 182   ------------------------------------------------------------------------------------------------------------------  Chemistries   Recent Labs Lab 09/26/16 0404  NA 134*  K 4.0  CL 94*  CO2 33*  GLUCOSE 119*  BUN 42*  CREATININE 2.09*  CALCIUM 8.6*  AST 22  ALT 17  ALKPHOS 44  BILITOT 1.3*   ------------------------------------------------------------------------------------------------------------------  Cardiac Enzymes No results for input(s): TROPONINI in the last 168 hours. ------------------------------------------------------------------------------------------------------------------  RADIOLOGY:  Dg Chest 1 View  Result Date: 09/26/2016 CLINICAL DATA:  Larey Seat earlier tonight.  Hematemesis. EXAM: CHEST 1 VIEW COMPARISON:  08/19/2016 FINDINGS: Moderate right hemidiaphragm elevation,  unchanged. Moderate cardiomegaly, unchanged. No confluent airspace consolidation. No large effusion. Normal pulmonary vasculature. Multiple remote healed fracture deformities of left ribs. IMPRESSION: Stable cardiomegaly.  No confluent consolidation or large effusion. Electronically Signed   By: Ellery Plunk M.D.   On: 09/26/2016 04:44   Ct Head Wo Contrast  Result Date: 09/26/2016 CLINICAL DATA:  71 y/o M; fall with hematoma to top of head and altered mental status. EXAM: CT HEAD WITHOUT CONTRAST CT CERVICAL SPINE WITHOUT CONTRAST TECHNIQUE: Multidetector CT imaging of the head and cervical spine was performed  following the standard protocol without intravenous contrast. Multiplanar CT image reconstructions of the cervical spine were also generated. COMPARISON:  09/15/2016 MRI of the brain and cervical spine. 07/16/2016 CT of the head. CT of cervical spine dated 03/03/2016. FINDINGS: CT HEAD FINDINGS Brain: No evidence of acute infarction, hemorrhage, hydrocephalus, extra-axial collection or mass lesion/mass effect. Small left cerebellar chronic lacunar infarct and small right frontal cortical infarct are stable. Scattered punctate calcifications are present over the right cerebral convexity stable from prior study probably representing sequelae of prior infectious or inflammatory process. Vascular: Extensive calcific atherosclerosis of the cavernous and paraclinoid internal carotid arteries. Skull: Scalp soft tissue thickening of the superior frontal region compatible with contusion. No displaced calvarial fracture. CT CERVICAL SPINE FINDINGS Alignment: Stable grade 1 C2-3 anterolisthesis and mild reversal of curvature apex at C3-4. Skull base and vertebrae:  No acute fracture identified. Soft tissues and spinal canal: Extensive calcific atherosclerosis of the carotid bifurcations bilaterally. Disc levels: Occipital the T2 posterior instrumented fusion fixed by multiple facet screws. There is no apparent  hardware related complication. There is C2 through C5 facet effusion bilaterally. There is prominent productive degenerative changes of the upper cervical spine most pronounced at the C3 through C5 levels. Posterior decompressive changes are present at the C1 through C6 levels. Incomplete atlantooccipital assimilation may be congenital or traumatic. Upper chest: Negative. Other: Negative appear IMPRESSION: 1. No acute intracranial abnormality identified. 2. Small scalp contusion in the superior frontal region. No displaced calvarial fracture. 3. 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 4. No acute fracture of the cervical spine. 5. Stable postsurgical changes related to occipital to T2 fusion, C1 the C6 posterior decompression. No apparent hardware related complication. 6. Stable advanced cervical spondylosis greatest at the C3 through C5 levels. Electronically Signed   By: Mitzi Hansen M.D.   On: 09/26/2016 06:33   Ct Cervical Spine Wo Contrast  Result Date: 09/26/2016 CLINICAL DATA:  71 y/o M; fall with hematoma to top of head and altered mental status. EXAM: CT HEAD WITHOUT CONTRAST CT CERVICAL SPINE WITHOUT CONTRAST TECHNIQUE: Multidetector CT imaging of the head and cervical spine was performed following the standard protocol without intravenous contrast. Multiplanar CT image reconstructions of the cervical spine were also generated. COMPARISON:  09/15/2016 MRI of the brain and cervical spine. 07/16/2016 CT of the head. CT of cervical spine dated 03/03/2016. FINDINGS: CT HEAD FINDINGS Brain: No evidence of acute infarction, hemorrhage, hydrocephalus, extra-axial collection or mass lesion/mass effect. Small left cerebellar chronic lacunar infarct and small right frontal cortical infarct are stable. Scattered punctate calcifications are present over the right cerebral convexity stable from prior study probably representing sequelae  of prior infectious or inflammatory process. Vascular: Extensive calcific atherosclerosis of the cavernous and paraclinoid internal carotid arteries. Skull: Scalp soft tissue thickening of the superior frontal region compatible with contusion. No displaced calvarial fracture. CT CERVICAL SPINE FINDINGS Alignment: Stable grade 1 C2-3 anterolisthesis and mild reversal of curvature apex at C3-4. Skull base and vertebrae:  No acute fracture identified. Soft tissues and spinal canal: Extensive calcific atherosclerosis of the carotid bifurcations bilaterally. Disc levels: Occipital the T2 posterior instrumented fusion fixed by multiple facet screws. There is no apparent hardware related complication. There is C2 through C5 facet effusion bilaterally. There is prominent productive degenerative changes of the upper cervical spine most pronounced at the C3 through C5 levels. Posterior decompressive changes are present at the C1 through C6 levels. Incomplete atlantooccipital assimilation  may be congenital or traumatic. Upper chest: Negative. Other: Negative appear IMPRESSION: 1. No acute intracranial abnormality identified. 2. Small scalp contusion in the superior frontal region. No displaced calvarial fracture. 3. 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 4. No acute fracture of the cervical spine. 5. Stable postsurgical changes related to occipital to T2 fusion, C1 the C6 posterior decompression. No apparent hardware related complication. 6. Stable advanced cervical spondylosis greatest at the C3 through C5 levels. Electronically Signed   By: Mitzi Hansen M.D.   On: 09/26/2016 06:33      IMPRESSION AND PLAN:   Acute on chronic respiratory failure with hypercapnia due to COPD exacerbation. The patient will be admitted to medical floor. Continue IV Solu-Medrol, albuterol, Pulmicort and Spiriva. Continue oxygen by nasal cannular. Pulmonary  consult.  Acute renal failure, ATN. Start normal saline IV, hold lisinopril, torsemide and ibuprofen. Follow up BMP.  Hypotension. Hold hypertension medication. Continue normal saline IV.  Acute Encephalopathy due to above. Aspiration and fall precaution.  Mild leukocytosis. Possible due to reaction. Follow-up CBC.  All the records are reviewed and case discussed with ED provider. Management plans discussed with the patient, family and they are in agreement.  CODE STATUS: Full code for now.  TOTAL TIME TAKING CARE OF THIS PATIENT: 60 minutes.    Shaune Pollack M.D on 09/26/2016 at 7:18 AM  Between 7am to 6pm - Pager - [phone]  After 6pm go to [email or web address] - Social research officer, government  [facility] [place] [facility]  Office  [phone]  CC: Primary care physician; Dorothey Baseman, MD   Note: This dictation was prepared with Dragon dictation along with smaller phrase technology. Any transcriptional errors that result from this process are unintentional.

## 2016-09-26 NOTE — Significant Event (Signed)
Rapid Response Event Note  Overview: Time Called: 1312 Arrival Time: 1315 Event Type: Neurologic  Initial Focused Assessment: Rapid Response RN arrived in room with Administrative Coordinator RN and patient's RN, another nurse from the patient's unit, and three respiratory therapists were already present. Patient was in bed and repeating that he "need[ed] to pee" over and over with a 1A holding a urinal in place for him and reminding him that he could pee she had the urinal in place. While patient's RN updated rapid response team, patient did void into urinal and fall asleep. Patient's RN reported that patient had been very lethargic and hard to arouse after admission today from the ED. He would be deeply sleeping and would only wake up and say a few words and then go back to sleep. He had a fall prior to admission and negative head CT this admission in the ED. He has a history of COPD and wears 2 L O2 nasal cannula chronically. Vital signs were HR 105 ST while awake, HR 85 SR when fell asleep during vital signs, BP 130/77 (MAP 95), O2 saturation 95% on 2 L nasal cannula. Patient already on off unit cardiac monitoring, no ectopy reported per patient's RN and none noticed when assessed at this time. CBG 156. Patient's pupils PERRLA, arouses to repeated stimulation sometimes, sometimes arouses spontaneously/to voice, falls right back to sleep after one to two words, lungs clear/diminished, skin color appropriate for ethnicity, unable to follow commands. In review of patient's labs drawn in ED at 04:04: creatinine elevated to 2.09 (previous admission in late march was 1.2), BUN elevated to 42 (late march 16), sodium level 134, liver function within defined parameters, magnesium level 3.0, urine drug screen negative, no ammonia level drawn. ED did also give 2 g of IV magnesium at 06:52. Patient currently receiving NS at 100 mL/hr IV.    Interventions: Respiratory therapy obtained ABG (pH 7.38, CO2 62, O2 72,  bicarbonate 36.7). No Bipap warranted at this time. Dr. Imogene Burn called back at 13:37 and spoke with patient's RN who updated him over the phone about patient's situation including lab values and assessment. MD ordered recheck of magnesium level and to check ammonia level, declined to get repeat head CT or MRI and declined transferring patient to step down level of care at this time.  Plan of Care (if not transferred): Patient to remain on 1A for now. Patient's RN instructed to re call rapid response if any questions about care or if patient deteriorates and to re page doctor about level of care evaluation if patient requires more frequent monitoring or assessments than medical surgical level of care. Patient to remain on cardiac monitoring. Rapid response team suggested increasing vital sign frequency to every 4 hours. Patient unable to be educated due to mental status.  Event Summary: Name of Physician Notified: Dr. Imogene Burn at 1312 (paged again [phone])    at    Outcome: Stayed in room and stabalized  Event End Time: 1347  Christell Constant [facility] [place]

## 2016-09-27 DIAGNOSIS — G9340 Encephalopathy, unspecified: Secondary | ICD-10-CM

## 2016-09-27 DIAGNOSIS — R410 Disorientation, unspecified: Secondary | ICD-10-CM

## 2016-09-27 DIAGNOSIS — K920 Hematemesis: Secondary | ICD-10-CM

## 2016-09-27 LAB — BASIC METABOLIC PANEL
Anion gap: 7 (ref 5–15)
BUN: 31 mg/dL — AB (ref 6–20)
CALCIUM: 8.4 mg/dL — AB (ref 8.9–10.3)
CO2: 33 mmol/L — ABNORMAL HIGH (ref 22–32)
CREATININE: 1.35 mg/dL — AB (ref 0.61–1.24)
Chloride: 98 mmol/L — ABNORMAL LOW (ref 101–111)
GFR calc Af Amer: >60 mL/min (ref >60)
GFR calc non Af Amer: 52 mL/min — ABNORMAL LOW (ref >60)
Glucose, Bld: 101 mg/dL — ABNORMAL HIGH (ref 65–99)
Potassium: 4.5 mmol/L (ref 3.5–5.1)
SODIUM: 138 mmol/L (ref 135–145)

## 2016-09-27 LAB — CBC
HCT: 37.5 % — ABNORMAL LOW (ref 40.0–52.0)
Hemoglobin: 12.3 g/dL — ABNORMAL LOW (ref 13.0–18.0)
MCH: 31 pg (ref 26.0–34.0)
MCHC: 32.9 g/dL (ref 32.0–36.0)
MCV: 94.4 fL (ref 80.0–100.0)
PLATELETS: 158 10*3/uL (ref 150–440)
RBC: 3.97 MIL/uL — ABNORMAL LOW (ref 4.40–5.90)
RDW: 17.7 % — AB (ref 11.5–14.5)
WBC: 8.4 10*3/uL (ref 3.8–10.6)

## 2016-09-27 LAB — MAGNESIUM: MAGNESIUM: 2.9 mg/dL — AB (ref 1.7–2.4)

## 2016-09-27 MED ORDER — HALOPERIDOL LACTATE 5 MG/ML IJ SOLN
2.0000 mg | Freq: Once | INTRAMUSCULAR | Status: AC
  Administered 2016-09-27: 2 mg via INTRAVENOUS
  Filled 2016-09-27: qty 1

## 2016-09-27 MED ORDER — QUETIAPINE FUMARATE 25 MG PO TABS
50.0000 mg | ORAL_TABLET | Freq: Every day | ORAL | Status: DC
  Administered 2016-09-27: 50 mg via ORAL
  Filled 2016-09-27: qty 2

## 2016-09-27 MED ORDER — HALOPERIDOL LACTATE 5 MG/ML IJ SOLN: 1.0000 mg | Freq: Four times a day (QID) | INTRAMUSCULAR | Status: DC | PRN

## 2016-09-27 MED ORDER — SERTRALINE HCL 50 MG PO TABS
75.0000 mg | ORAL_TABLET | Freq: Every day | ORAL | Status: DC
  Administered 2016-09-27 – 2016-09-28 (×2): 75 mg via ORAL
  Filled 2016-09-27 (×2): qty 1

## 2016-09-27 NOTE — Progress Notes (Signed)
[facility]  Patient Status: Active  [facility] is providing the following services: SN/PT/HHA  If patient discharges after hours, please call [phone].   Scherrie Gerlach [place] 09/27/2016, 9:12 AM

## 2016-09-27 NOTE — Progress Notes (Signed)
Paged MD on call to request something to calm pt down, yelling and screaming constantly. Md put in an order for haldol

## 2016-09-27 NOTE — Progress Notes (Signed)
Date: 09/27/2016,   MRN# [id number] KUZEY OGATA [birth year] Code Status:     Code Status Orders        Start     Ordered   09/26/16 0852  Full code  Continuous     09/26/16 0852    Code Status History    Date Active Date Inactive Code Status Order ID Comments User Context   09/13/2016 10:29 PM 09/16/2016  3:51 PM Full Code [id number]  Oralia Manis, MD ED   09/09/2016  9:11 AM 09/10/2016  4:32 PM Full Code [id number]  Nada Libman, MD Inpatient   08/20/2016  2:06 AM 08/23/2016  9:38 PM Full Code [id number]  Tonye Royalty, DO Inpatient   07/16/2016  1:12 PM 07/27/2016  8:18 PM Full Code [id number]  Erin Fulling, MD ED   03/09/2016  5:20 PM 03/15/2016 10:06 PM Full Code [id number]  Heide Spark Inpatient   03/03/2016  6:09 PM 03/09/2016  4:04 PM Full Code [id number]  Erin Fulling, MD ED   02/27/2016 10:41 PM 02/29/2016  7:53 PM Full Code [id number]  Oralia Manis, MD Inpatient   07/22/2015  7:30 PM 07/26/2015 12:16 AM Full Code [id number]  Adrian Saran, MD Inpatient   02/11/2015 10:28 AM 02/11/2015  6:15 PM Full Code [id number]  Nada Libman, MD Inpatient   08/04/2014  8:13 PM 08/13/2014  2:53 PM Full Code [id number]  Lynden Oxford, MD Inpatient    Advance Directive Documentation     Most Recent Value  Type of Advance Directive  Living will  Pre-existing out of facility DNR order (yellow form or pink MOST form)  -  "MOST" Form in Place?  -     Hosp day:@LENGTHOFSTAYDAYS @ Referring MD: @      CC: copd, co2 retention, hypoxia  HPI: hx reviewed, spoke with Dr Imogene Burn. Known hx of copd, hypoxia and co2 retention on admission as well as renal failure( improving). Also having hypersomnolence and bouts of agitation. He has had prior bouts of respiratory failure and s/p tracheostomy recently.   PMHX:   Past Medical History:  Diagnosis Date  . Antalgic gait   . Carotid artery occlusion   . Chronic neck and back pain   . Complication of anesthesia    pt has had cervical fusion-limited neck flextion  . COPD  (chronic obstructive pulmonary disease) (HCC)   . Depression   . DJD (degenerative joint disease)    Right shoulder  . Hypertension   . Lumbago   . Peripheral neuropathy [facility])    Surgical Hx:  Past Surgical History:  Procedure Laterality Date  . ABDOMINAL AORTIC ANEURYSM REPAIR  2009   Endovascular AAA repair  . ABDOMINAL AORTIC ANEURYSM REPAIR    . CAROTID ANGIOGRAPHY  09/09/2016   Procedure: Carotid Angiography;  Surgeon: Nada Libman, MD;  Location: [facility] INVASIVE CV LAB;  Service: Cardiovascular;;  rt. carotid  . CARPAL TUNNEL RELEASE     bilateral CTS  . HAMMER TOE SURGERY  12/10/2011   Procedure: HAMMER TOE CORRECTION;  Surgeon: Ernestene Kiel, DPM;  Location: [facility];  Service: Podiatry;  Laterality: Right;  Right hammertoe repair second right with 2.5x42 orthopro screw   . IR GENERIC HISTORICAL  04/05/2016   IR GASTROSTOMY TUBE MOD SED 04/05/2016 [facility]  . IR GENERIC HISTORICAL  05/12/2016   IR CM INJ ANY COLONIC TUBE W/FLUORO 05/12/2016 Malachy Moan, MD [facility]  . IR GENERIC HISTORICAL  05/26/2016   IR CM INJ ANY COLONIC TUBE  W/FLUORO 05/26/2016 [facility]  . IR GENERIC HISTORICAL  05/28/2016   IR REPLC GASTRO/COLONIC TUBE PERCUT W/FLUORO 05/28/2016 Brayton El, PA-C [facility]  . IR GENERIC HISTORICAL  07/02/2016   IR GASTROSTOMY TUBE REMOVAL 07/02/2016 Brayton El, PA-C [facility]  . METATARSAL OSTEOTOMY  12/10/2011   Procedure: METATARSAL OSTEOTOMY;  Surgeon: Ernestene Kiel, DPM;  Location: [facility];  Service: Podiatry;  Laterality: Right;  Right Keller arthroplasty with size 5 silicone implant right great toe; second metatarsal osteotomy with 2.0x14 screw  . NECK SURGERY    . PERIPHERAL VASCULAR CATHETERIZATION N/A 02/11/2015   Procedure: Carotid Angiography;  Surgeon: Nada Libman, MD;  Location: [facility] INVASIVE CV LAB;  Service: Cardiovascular;  Laterality: N/A;  . PILONIDAL CYST / SINUS EXCISION    . Rotary cuff    .  SPINE SURGERY  09/15/11   Scar tissue removed- back  . SPINE SURGERY  09/15/11   Scar tissue removed- back  . TONSILLECTOMY    . TRACHEOSTOMY TUBE PLACEMENT N/A 03/03/2016   Procedure: TRACHEOSTOMY;  Surgeon: Geanie Logan, MD;  Location: [facility];  Service: ENT;  Laterality: N/A;   Family Hx:  Family History  Problem Relation Age of Onset  . Stomach cancer Mother   . Hypertension Brother   . Heart attack Father    Social Hx:   Social History  Substance Use Topics  . Smoking status: Former Smoker    Quit date: 08/30/2006  . Smokeless tobacco: Never Used  . Alcohol use 1.2 oz/week    2 Cans of beer per week     Comment: occasional use   Medication:    Home Medication:    Current Medication: @   Allergies:  Ampicillin; Nortriptyline; Brimonidine; Cyclobenzaprine; Tizanidine; and Gabapentin  Review of Systems: Gen:  Denies  fever, sweats, chills HEENT: Denies blurred vision, double vision, ear pain, eye pain, hearing loss, nose bleeds, sore throat Cvc:  No dizziness, chest pain or heaviness Resp: sob, cough, no hemoptysis    Gi: Denies swallowing difficulty, stomach pain, nausea or vomiting, diarrhea, constipation, bowel incontinence Gu:  Denies bladder incontinence, burning urine Ext:   No Joint pain, stiffness or swelling Skin: No skin rash, easy bruising or bleeding or hives Endoc:  No polyuria, polydipsia , polyphagia or weight change Psych: No depression, insomnia or hallucinations  Other:  All other systems negative  Physical Examination:   VS: BP 125/60 (BP Location: Right Arm)   Pulse 80   Temp 98.8 F (37.1 C) (Oral)   Resp 16   Ht  (1.727 m)   Wt 208 lb (94.3 kg)   SpO2 98%   BMI 31.63 kg/m   General Appearance: No distress  Neuro: without focal findings, mental status,drowsy when I saw hie, answered questions appropiately speech normal, alert and oriented, cranial nerves 2-12 intact, reflexes normal and symmetric, sensation grossly  normal NECK: Short, no stridor, old, healed trach site   HEENT: PERRLA, EOM intact, no ptosis, no other lesions noticed Pulmonary:.rare wheezing, No rales     Cardiovascular:  Normal S1,S2.  No m/r/g.  .    Abdomen:Benign, Soft, non-tender, No masses, hepatosplenomegaly, No lymphadenopathy Endoc: No evident thyromegaly, no signs of acromegaly or Cushing features Skin:   warm, no rashes, no ecchymosis  Extremities: normal, no cyanosis, clubbing, no edema, warm with normal capillary refill.    Labs results:   Recent Labs     09/26/16  0404  09/26/16  1420  09/27/16  0431  HGB  11.7*  11.9*  12.3*  HCT  36.7*   --   37.5*  MCV  91.9   --   94.4  WBC  12.0*   --   8.4  BUN  42*   --   31*  CREATININE  2.09*   --   1.35*  GLUCOSE  119*   --   101*  CALCIUM  8.6*   --   8.4*  ,    No results for input(s): PH in the last 72 hours.  Invalid input(s): PCO2, PO2, BASEEXCESS, BASEDEFICITE, TFT  Culture results:     Rad results:   Mr Brain Wo Contrast  Result Date: 09/26/2016 CLINICAL DATA:  Initial evaluation for acute altered mental status, fall. EXAM: MRI HEAD WITHOUT CONTRAST TECHNIQUE: Multiplanar, multiecho pulse sequences of the brain and surrounding structures were obtained without intravenous contrast. COMPARISON:  [facility] CT from earlier the same day. FINDINGS: Brain: Study fairly degraded by motion artifact. Diffuse prominence of the CSF containing spaces is compatible with generalized age-related cerebral atrophy. Mild chronic microvascular changes present within the periventricular and deep white matter. Focal encephalomalacia within the anterior right frontal lobe compatible with remote right MCA territory infarct. Small remote left cerebellar infarct noted as well. No abnormal foci of restricted diffusion to suggest acute or subacute infarct. Gray-white matter differentiation maintained. No evidence for acute intracranial hemorrhage. No mass lesion, midline shift or mass  effect. No hydrocephalus. No extra-axial fluid collection. Vascular: Major intracranial vascular flow voids grossly maintained. Right vertebral artery diminutive. Skull and upper cervical spine: Craniocervical junction grossly unremarkable. Susceptibility artifact from posterior spinal fusion hardware noted within the upper cervical spine. Bone marrow signal intensity within normal limits. Small frontal scalp contusion noted he vertex. Sinuses/Orbits: Globes and orbital soft tissues within normal limits. Paranasal sinuses are clear. Small right mastoid effusion noted. Other: None. IMPRESSION: 1. No acute intracranial process identified. 2. Small frontal scalp contusion near the vertex. 3. Remote right MCA and left cerebellar infarcts. 4. Mild chronic microvascular ischemic disease. Electronically Signed   By: Rise Mu M.D.   On: 09/26/2016 23:05   CLINICAL DATA:  Larey Seat earlier tonight.  Hematemesis.  EXAM: CHEST 1 VIEW  COMPARISON:  08/19/2016  FINDINGS: Moderate right hemidiaphragm elevation, unchanged. Moderate cardiomegaly, unchanged. No confluent airspace consolidation. No large effusion. Normal pulmonary vasculature.  Multiple remote healed fracture deformities of left ribs.  IMPRESSION: Stable cardiomegaly.  No confluent consolidation or large effusion.   Electronically Signed   By: Ellery Plunk M.D.   On: 09/26/2016 04:44  Assessment and Plan: Hypercapnia respiratory failure/copd. Elevated right hemidiaphragm. ? Paralyzed, if so would also explain some of his dyspnea and hypercapnia. ? underlying sleep apnea -agree with the present copd regimen -if mental status permits, consider bipap 12/8 at night -sniff test when stable  Mental status changes, head ct noted, ? Due to hypoxia (better), hypercapnia, (better), elevated creatine ( improving), hospital psychosis etc part of the differential Maintain sats 91-92 % Avoid higher sats to avoid co2  retention Following neurology recs     I have personally obtained a history, examined the patient, evaluated laboratory and imaging results, formulated the assessment and plan and placed orders.  The Patient requires high complexity decision making for assessment and support, frequent evaluation and titration of therapies, application of advanced monitoring technologies and extensive interpretation of multiple databases.   Niles Ess,M.D. Pulmonary & Critical care Medicine [facility]

## 2016-09-27 NOTE — Consult Note (Signed)
Reason for Consult:LE weakness  Referring Physician: Dr. Imogene Burn   CC: LE weakness   HPI: DALEN HENNESSEE is an 71 y.o. male  known history of COPD, chronic respiratory failure on home oxygen 2 L, carotid artery occlusion, hypertension, chronic neck pain and peripheral neuropathy.  Pt was found to be lethargic generalized weakness LE more then UE.   Creat was elevated on admission.  Now mentation improved.    Past Medical History:  Diagnosis Date  . Antalgic gait   . Carotid artery occlusion   . Chronic neck and back pain   . Complication of anesthesia    pt has had cervical fusion-limited neck flextion  . COPD (chronic obstructive pulmonary disease) (HCC)   . Depression   . DJD (degenerative joint disease)    Right shoulder  . Hypertension   . Lumbago   . Peripheral neuropathy [facility])     Past Surgical History:  Procedure Laterality Date  . ABDOMINAL AORTIC ANEURYSM REPAIR  2009   Endovascular AAA repair  . ABDOMINAL AORTIC ANEURYSM REPAIR    . CAROTID ANGIOGRAPHY  09/09/2016   Procedure: Carotid Angiography;  Surgeon: Nada Libman, MD;  Location: [facility] INVASIVE CV LAB;  Service: Cardiovascular;;  rt. carotid  . CARPAL TUNNEL RELEASE     bilateral CTS  . HAMMER TOE SURGERY  12/10/2011   Procedure: HAMMER TOE CORRECTION;  Surgeon: Ernestene Kiel, DPM;  Location: [facility];  Service: Podiatry;  Laterality: Right;  Right hammertoe repair second right with 2.5x42 orthopro screw   . IR GENERIC HISTORICAL  04/05/2016   IR GASTROSTOMY TUBE MOD SED 04/05/2016 [facility]  . IR GENERIC HISTORICAL  05/12/2016   IR CM INJ ANY COLONIC TUBE W/FLUORO 05/12/2016 Malachy Moan, MD [facility]  . IR GENERIC HISTORICAL  05/26/2016   IR CM INJ ANY COLONIC TUBE W/FLUORO 05/26/2016 [facility]  . IR GENERIC HISTORICAL  05/28/2016   IR REPLC GASTRO/COLONIC TUBE PERCUT W/FLUORO 05/28/2016 Brayton El, PA-C [facility]  . IR GENERIC HISTORICAL  07/02/2016   IR GASTROSTOMY TUBE REMOVAL  07/02/2016 Brayton El, PA-C [facility]  . METATARSAL OSTEOTOMY  12/10/2011   Procedure: METATARSAL OSTEOTOMY;  Surgeon: Ernestene Kiel, DPM;  Location: [facility];  Service: Podiatry;  Laterality: Right;  Right Keller arthroplasty with size 5 silicone implant right great toe; second metatarsal osteotomy with 2.0x14 screw  . NECK SURGERY    . PERIPHERAL VASCULAR CATHETERIZATION N/A 02/11/2015   Procedure: Carotid Angiography;  Surgeon: Nada Libman, MD;  Location: [facility] INVASIVE CV LAB;  Service: Cardiovascular;  Laterality: N/A;  . PILONIDAL CYST / SINUS EXCISION    . Rotary cuff    . SPINE SURGERY  09/15/11   Scar tissue removed- back  . SPINE SURGERY  09/15/11   Scar tissue removed- back  . TONSILLECTOMY    . TRACHEOSTOMY TUBE PLACEMENT N/A 03/03/2016   Procedure: TRACHEOSTOMY;  Surgeon: Geanie Logan, MD;  Location: [facility] ORS;  Service: ENT;  Laterality: N/A;    Family History  Problem Relation Age of Onset  . Stomach cancer Mother   . Hypertension Brother   . Heart attack Father     Social History:  reports that he quit smoking about 10 years ago. He has never used smokeless tobacco. He reports that he drinks about 1.2 oz of alcohol per week . He reports that he does not use drugs.  Allergies  Allergen Reactions  . Ampicillin Swelling and Other (See Comments)  Unable to obtain enough information to answer additional questions about this medication.  Has patient had a PCN reaction causing immediate rash, facial/tongue/throat swelling, SOB or lightheadedness with hypotension: Yes Has patient had a PCN reaction causing severe rash involving mucus membranes or skin necrosis: No Has patient had a PCN reaction that required hospitalization No Has patient had a PCN reaction occurring within the last 10 years: Yes If all of the above answers are "NO", then may pr  . Nortriptyline Other (See Comments)    Sleep walking  . Brimonidine Other (See Comments)    Unknown reaction  per MAR   . Cyclobenzaprine Other (See Comments)    Hallucinations  . Tizanidine Other (See Comments)    Hallucinations  . Gabapentin Rash    Medications: I have reviewed the patient's current medications.  ROS: History obtained from the patient  General ROS: negative for - chills, fatigue, fever, night sweats, weight gain or weight loss Psychological FAO:ZHYQMVH of depression and anxiety Ophthalmic ROS: negative for - blurry vision, double vision, eye pain or loss of vision ENT ROS: negative for - epistaxis, nasal discharge, oral lesions, sore throat, tinnitus or vertigo Allergy and Immunology ROS: negative for - hives or itchy/watery eyes Hematological and Lymphatic ROS: negative for - bleeding problems, bruising or swollen lymph nodes Endocrine ROS: negative for - galactorrhea, hair pattern changes, polydipsia/polyuria or temperature intolerance Respiratory ROS: negative for - cough, hemoptysis, shortness of breath or wheezing Cardiovascular ROS: negative for - chest pain, dyspnea on exertion, edema or irregular heartbeat Gastrointestinal ROS: negative for - abdominal pain, diarrhea, hematemesis, nausea/vomiting or stool incontinence Genito-Urinary ROS: negative for - dysuria, hematuria, incontinence or urinary frequency/urgency Musculoskeletal ROS: negative for - joint swelling or muscular weakness Neurological ROS: as noted in HPI Dermatological ROS: negative for rash and skin lesion changes  Physical Examination: Blood pressure 122/75, pulse 100, temperature 98 F (36.7 C), temperature source Oral, resp. rate 16, height  (1.727 m), weight 94.3 kg (208 lb), SpO2 96 %.   Neurological Examination   Mental Status: Alert to name  Cranial Nerves: II: Discs flat bilaterally; Visual fields grossly normal, pupils equal, round, reactive to light and accommodation III,IV, VI: ptosis not present, extra-ocular motions intact bilaterally V,VII: smile symmetric, facial light touch  sensation normal bilaterally VIII: hearing normal bilaterally IX,X: gag reflex present XI: bilateral shoulder shrug XII: midline tongue extension Motor: Right : Upper extremity   4+/5    Left:     Upper extremity   4+/5  Lower extremity   4+/5     Lower extremity   4+/5 Tone and bulk:normal tone throughout; no atrophy noted Sensory: Pinprick and light touch intact throughout, bilaterally Deep Tendon Reflexes: 1+ and symmetric throughout Plantars: Right: downgoing   Left: downgoing Cerebellar: Not tested  Gait: not tested       Laboratory Studies:   Basic Metabolic Panel:  Recent Labs Lab 09/26/16 0404 09/26/16 1420 09/27/16 0431  NA 134*  --  138  K 4.0  --  4.5  CL 94*  --  98*  CO2 33*  --  33*  GLUCOSE 119*  --  101*  BUN 42*  --  31*  CREATININE 2.09*  --  1.35*  CALCIUM 8.6*  --  8.4*  MG 3.0* 3.6* 2.9*    Liver Function Tests:  Recent Labs Lab 09/26/16 0404  AST 22  ALT 17  ALKPHOS 44  BILITOT 1.3*  PROT 6.5  ALBUMIN 3.8  Recent Labs Lab 09/26/16 0404  LIPASE 15    Recent Labs Lab 09/26/16 1420  AMMONIA 13    CBC:  Recent Labs Lab 09/26/16 0404 09/26/16 1420 09/27/16 0431  WBC 12.0*  --  8.4  HGB 11.7* 11.9* 12.3*  HCT 36.7*  --  37.5*  MCV 91.9  --  94.4  PLT 182  --  158    Cardiac Enzymes: No results for input(s): CKTOTAL, CKMB, CKMBINDEX, TROPONINI in the last 168 hours.  BNP: Invalid input(s): POCBNP  CBG:  Recent Labs Lab 09/26/16 1321  GLUCAP 156*    Microbiology: Results for orders placed or performed during the hospital encounter of 09/13/16  Urine culture     Status: Abnormal   Collection Time: 09/13/16  5:15 PM  Result Value Ref Range Status   Specimen Description URINE, RANDOM  Final   Special Requests NONE  Final   Culture >=100,000 COLONIES/mL SERRATIA MARCESCENS (A)  Final   Report Status 09/16/2016 FINAL  Final   Organism ID, Bacteria SERRATIA MARCESCENS (A)  Final      Susceptibility    Serratia marcescens - MIC*    CEFAZOLIN >=64 RESISTANT Resistant     CEFTRIAXONE <=1 SENSITIVE Sensitive     CIPROFLOXACIN <=0.25 SENSITIVE Sensitive     GENTAMICIN <=1 SENSITIVE Sensitive     NITROFURANTOIN 256 RESISTANT Resistant     TRIMETH/SULFA <=20 SENSITIVE Sensitive     * >=100,000 COLONIES/mL SERRATIA MARCESCENS    Coagulation Studies: No results for input(s): LABPROT, INR in the last 72 hours.  Urinalysis:  Recent Labs Lab 09/26/16 0436  COLORURINE YELLOW*  LABSPEC 1.014  PHURINE 7.0  GLUCOSEU NEGATIVE  HGBUR NEGATIVE  BILIRUBINUR NEGATIVE  KETONESUR NEGATIVE  PROTEINUR NEGATIVE  NITRITE NEGATIVE  LEUKOCYTESUR NEGATIVE    Lipid Panel:     Component Value Date/Time   TRIG 111 07/19/2016 1153    HgbA1C:  Lab Results  Component Value Date   HGBA1C 5.6 06/10/2016    Urine Drug Screen:     Component Value Date/Time   LABOPIA NONE DETECTED 09/26/2016 0436   COCAINSCRNUR NONE DETECTED 09/26/2016 0436   LABBENZ NONE DETECTED 09/26/2016 0436   AMPHETMU NONE DETECTED 09/26/2016 0436   THCU NONE DETECTED 09/26/2016 0436   LABBARB NONE DETECTED 09/26/2016 0436    Alcohol Level: No results for input(s): ETH in the last 168 hours.   Imaging: Dg Chest 1 View  Result Date: 09/26/2016 CLINICAL DATA:  Larey Seat earlier tonight.  Hematemesis. EXAM: CHEST 1 VIEW COMPARISON:  08/19/2016 FINDINGS: Moderate right hemidiaphragm elevation, unchanged. Moderate cardiomegaly, unchanged. No confluent airspace consolidation. No large effusion. Normal pulmonary vasculature. Multiple remote healed fracture deformities of left ribs. IMPRESSION: Stable cardiomegaly.  No confluent consolidation or large effusion. Electronically Signed   By: Ellery Plunk M.D.   On: 09/26/2016 04:44   Ct Head Wo Contrast  Result Date: 09/26/2016 CLINICAL DATA:  71 y/o M; fall with hematoma to top of head and altered mental status. EXAM: CT HEAD WITHOUT CONTRAST CT CERVICAL SPINE WITHOUT CONTRAST  TECHNIQUE: Multidetector CT imaging of the head and cervical spine was performed following the standard protocol without intravenous contrast. Multiplanar CT image reconstructions of the cervical spine were also generated. COMPARISON:  09/15/2016 MRI of the brain and cervical spine. 07/16/2016 CT of the head. CT of cervical spine dated 03/03/2016. FINDINGS: CT HEAD FINDINGS Brain: No evidence of acute infarction, hemorrhage, hydrocephalus, extra-axial collection or mass lesion/mass effect. Small left cerebellar chronic lacunar infarct and  small right frontal cortical infarct are stable. Scattered punctate calcifications are present over the right cerebral convexity stable from prior study probably representing sequelae of prior infectious or inflammatory process. Vascular: Extensive calcific atherosclerosis of the cavernous and paraclinoid internal carotid arteries. Skull: Scalp soft tissue thickening of the superior frontal region compatible with contusion. No displaced calvarial fracture. CT CERVICAL SPINE FINDINGS Alignment: Stable grade 1 C2-3 anterolisthesis and mild reversal of curvature apex at C3-4. Skull base and vertebrae:  No acute fracture identified. Soft tissues and spinal canal: Extensive calcific atherosclerosis of the carotid bifurcations bilaterally. Disc levels: Occipital the T2 posterior instrumented fusion fixed by multiple facet screws. There is no apparent hardware related complication. There is C2 through C5 facet effusion bilaterally. There is prominent productive degenerative changes of the upper cervical spine most pronounced at the C3 through C5 levels. Posterior decompressive changes are present at the C1 through C6 levels. Incomplete atlantooccipital assimilation may be congenital or traumatic. Upper chest: Negative. Other: Negative appear IMPRESSION: 1. No acute intracranial abnormality identified. 2. Small scalp contusion in the superior frontal region. No displaced calvarial  fracture. 3. 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 4. No acute fracture of the cervical spine. 5. Stable postsurgical changes related to occipital to T2 fusion, C1 the C6 posterior decompression. No apparent hardware related complication. 6. Stable advanced cervical spondylosis greatest at the C3 through C5 levels. Electronically Signed   By: Mitzi Hansen M.D.   On: 09/26/2016 06:33   Ct Cervical Spine Wo Contrast  Result Date: 09/26/2016 CLINICAL DATA:  71 y/o M; fall with hematoma to top of head and altered mental status. EXAM: CT HEAD WITHOUT CONTRAST CT CERVICAL SPINE WITHOUT CONTRAST TECHNIQUE: Multidetector CT imaging of the head and cervical spine was performed following the standard protocol without intravenous contrast. Multiplanar CT image reconstructions of the cervical spine were also generated. COMPARISON:  09/15/2016 MRI of the brain and cervical spine. 07/16/2016 CT of the head. CT of cervical spine dated 03/03/2016. FINDINGS: CT HEAD FINDINGS Brain: No evidence of acute infarction, hemorrhage, hydrocephalus, extra-axial collection or mass lesion/mass effect. Small left cerebellar chronic lacunar infarct and small right frontal cortical infarct are stable. Scattered punctate calcifications are present over the right cerebral convexity stable from prior study probably representing sequelae of prior infectious or inflammatory process. Vascular: Extensive calcific atherosclerosis of the cavernous and paraclinoid internal carotid arteries. Skull: Scalp soft tissue thickening of the superior frontal region compatible with contusion. No displaced calvarial fracture. CT CERVICAL SPINE FINDINGS Alignment: Stable grade 1 C2-3 anterolisthesis and mild reversal of curvature apex at C3-4. Skull base and vertebrae:  No acute fracture identified. Soft tissues and spinal canal: Extensive calcific atherosclerosis of the carotid  bifurcations bilaterally. Disc levels: Occipital the T2 posterior instrumented fusion fixed by multiple facet screws. There is no apparent hardware related complication. There is C2 through C5 facet effusion bilaterally. There is prominent productive degenerative changes of the upper cervical spine most pronounced at the C3 through C5 levels. Posterior decompressive changes are present at the C1 through C6 levels. Incomplete atlantooccipital assimilation may be congenital or traumatic. Upper chest: Negative. Other: Negative appear IMPRESSION: 1. No acute intracranial abnormality identified. 2. Small scalp contusion in the superior frontal region. No displaced calvarial fracture. 3. 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 4. No acute fracture of the cervical spine. 5. Stable postsurgical changes related to occipital to T2 fusion,  C1 the C6 posterior decompression. No apparent hardware related complication. 6. Stable advanced cervical spondylosis greatest at the C3 through C5 levels. Electronically Signed   By: Mitzi Hansen M.D.   On: 09/26/2016 06:33   Mr Brain Wo Contrast  Result Date: 09/26/2016 CLINICAL DATA:  Initial evaluation for acute altered mental status, fall. EXAM: MRI HEAD WITHOUT CONTRAST TECHNIQUE: Multiplanar, multiecho pulse sequences of the brain and surrounding structures were obtained without intravenous contrast. COMPARISON:  [facility] CT from earlier the same day. FINDINGS: Brain: Study fairly degraded by motion artifact. Diffuse prominence of the CSF containing spaces is compatible with generalized age-related cerebral atrophy. Mild chronic microvascular changes present within the periventricular and deep white matter. Focal encephalomalacia within the anterior right frontal lobe compatible with remote right MCA territory infarct. Small remote left cerebellar infarct noted as well. No abnormal foci of restricted  diffusion to suggest acute or subacute infarct. Gray-white matter differentiation maintained. No evidence for acute intracranial hemorrhage. No mass lesion, midline shift or mass effect. No hydrocephalus. No extra-axial fluid collection. Vascular: Major intracranial vascular flow voids grossly maintained. Right vertebral artery diminutive. Skull and upper cervical spine: Craniocervical junction grossly unremarkable. Susceptibility artifact from posterior spinal fusion hardware noted within the upper cervical spine. Bone marrow signal intensity within normal limits. Small frontal scalp contusion noted he vertex. Sinuses/Orbits: Globes and orbital soft tissues within normal limits. Paranasal sinuses are clear. Small right mastoid effusion noted. Other: None. IMPRESSION: 1. No acute intracranial process identified. 2. Small frontal scalp contusion near the vertex. 3. Remote right MCA and left cerebellar infarcts. 4. Mild chronic microvascular ischemic disease. Electronically Signed   By: Rise Mu M.D.   On: 09/26/2016 23:05     Assessment/Plan: 71 y.o. male  known history of COPD, chronic respiratory failure on home oxygen 2 L, carotid artery occlusion, hypertension, chronic neck pain and peripheral neuropathy.  Pt was found to be lethargic generalized weakness LE more then UE.   Creat was elevated on admission.  Now mentation improved.    - CTH no acute abnormalities - Mentation improved but unsure of baseline - Would not do any further imaging from neuro stand ponit - Would con't his home psychiatric medications as creatinine has improved   09/27/2016, 2:26 PM

## 2016-09-27 NOTE — Progress Notes (Signed)
Checked SNF days through [facility] automated system [phone]).  As of last billing date on record (08/23/16), patient has 0 full SNF and 0 co-SNF days available.

## 2016-09-27 NOTE — Consult Note (Signed)
Midge Minium, MD [facility]  [address], [address] [place], [place] [address] Phone: [phone] Fax : [phone]  Consultation  Referring Provider:     Dr. Imogene Burn Primary Care Physician:  Dorothey Baseman, MD Primary Gastroenterologist:  Dr. Russella Dar         Reason for Consultation:     Vomiting blood  Date of Admission:  09/26/2016 Date of Consultation:  09/27/2016         HPI:   Chad Huffman is a 71 y.o. male Who is admitted with confusion and a history of COPD.  The patient reports that he had a tracheostomy and a PEG tube.  The patient was confused yesterday and had an episode of vomiting. The patient reports that he vomited once and he believes there was red material in it which could've been blood.  The patient's hemoglobin has been stable and his hemoglobin this morning is 11.9.  The patient was admitted with suspected COPD exacerbation after having a fall. The patient has had no abdominal pain nor has he had any further episodes of vomiting or hematemesis. The patient has been stable since admission and his hemoglobin has remained stable. I'm now being asked to see the patient for a single episode of vomiting with what appeared to the patient to be blood.  Past Medical History:  Diagnosis Date  . Antalgic gait   . Carotid artery occlusion   . Chronic neck and back pain   . Complication of anesthesia    pt has had cervical fusion-limited neck flextion  . COPD (chronic obstructive pulmonary disease) (HCC)   . Depression   . DJD (degenerative joint disease)    Right shoulder  . Hypertension   . Lumbago   . Peripheral neuropathy [facility])     Past Surgical History:  Procedure Laterality Date  . ABDOMINAL AORTIC ANEURYSM REPAIR  2009   Endovascular AAA repair  . ABDOMINAL AORTIC ANEURYSM REPAIR    . CAROTID ANGIOGRAPHY  09/09/2016   Procedure: Carotid Angiography;  Surgeon: Nada Libman, MD;  Location: [facility] INVASIVE CV LAB;  Service: Cardiovascular;;  rt. carotid  . CARPAL TUNNEL  RELEASE     bilateral CTS  . HAMMER TOE SURGERY  12/10/2011   Procedure: HAMMER TOE CORRECTION;  Surgeon: Ernestene Kiel, DPM;  Location: [facility];  Service: Podiatry;  Laterality: Right;  Right hammertoe repair second right with 2.5x42 orthopro screw   . IR GENERIC HISTORICAL  04/05/2016   IR GASTROSTOMY TUBE MOD SED 04/05/2016 [facility]  . IR GENERIC HISTORICAL  05/12/2016   IR CM INJ ANY COLONIC TUBE W/FLUORO 05/12/2016 Malachy Moan, MD [facility]  . IR GENERIC HISTORICAL  05/26/2016   IR CM INJ ANY COLONIC TUBE W/FLUORO 05/26/2016 [facility]  . IR GENERIC HISTORICAL  05/28/2016   IR REPLC GASTRO/COLONIC TUBE PERCUT W/FLUORO 05/28/2016 Brayton El, PA-C [facility]  . IR GENERIC HISTORICAL  07/02/2016   IR GASTROSTOMY TUBE REMOVAL 07/02/2016 Brayton El, PA-C [facility]  . METATARSAL OSTEOTOMY  12/10/2011   Procedure: METATARSAL OSTEOTOMY;  Surgeon: Ernestene Kiel, DPM;  Location: [facility];  Service: Podiatry;  Laterality: Right;  Right Keller arthroplasty with size 5 silicone implant right great toe; second metatarsal osteotomy with 2.0x14 screw  . NECK SURGERY    . PERIPHERAL VASCULAR CATHETERIZATION N/A 02/11/2015   Procedure: Carotid Angiography;  Surgeon: Nada Libman, MD;  Location: [facility] INVASIVE CV LAB;  Service: Cardiovascular;  Laterality: N/A;  . PILONIDAL CYST /  SINUS EXCISION    . Rotary cuff    . SPINE SURGERY  09/15/11   Scar tissue removed- back  . SPINE SURGERY  09/15/11   Scar tissue removed- back  . TONSILLECTOMY    . TRACHEOSTOMY TUBE PLACEMENT N/A 03/03/2016   Procedure: TRACHEOSTOMY;  Surgeon: Geanie Logan, MD;  Location: [facility] ORS;  Service: ENT;  Laterality: N/A;    Prior to Admission medications   Medication Sig Start Date End Date Taking? Authorizing Provider  albuterol (ACCUNEB) 1.25 MG/3ML nebulizer solution Take 1 ampule by nebulization every 4 (four) hours as needed for wheezing.   Yes Historical Provider, MD    aspirin EC 81 MG tablet Take 81 mg by mouth daily.   Yes Historical Provider, MD  baclofen (LIORESAL) 20 MG tablet Take 20 mg by mouth 3 (three) times daily.   Yes Historical Provider, MD  budesonide (PULMICORT) 0.5 MG/2ML nebulizer solution Take 0.5 mg by nebulization 2 (two) times daily.   Yes Historical Provider, MD  clonazePAM (KLONOPIN) 0.5 MG tablet Take 1 tablet (0.5 mg total) by mouth 2 (two) times daily. 08/23/16  Yes Katharina Caper, MD  clopidogrel (PLAVIX) 75 MG tablet Take 75 mg by mouth daily.   Yes Historical Provider, MD  folic acid (FOLVITE) 1 MG tablet Take 1 mg by mouth daily.    Yes Historical Provider, MD  levothyroxine (SYNTHROID, LEVOTHROID) 75 MCG tablet Take 1 tablet (75 mcg total) by mouth daily before breakfast. 09/16/16  Yes Enid Baas, MD  lisinopril (PRINIVIL,ZESTRIL) 10 MG tablet Take 1 tablet (10 mg total) by mouth at bedtime. 09/16/16  Yes Enid Baas, MD  metoCLOPramide (REGLAN) 5 MG tablet Take 2.5 mg by mouth every 6 (six) hours.    Yes Historical Provider, MD  metoprolol tartrate (LOPRESSOR) 25 MG tablet Take 1 tablet (25 mg total) by mouth 2 (two) times daily. 07/27/16  Yes Katharina Caper, MD  Multiple Vitamin (MULTIVITAMIN WITH MINERALS) TABS tablet Take 1 tablet by mouth daily.   Yes Historical Provider, MD  oxyCODONE (OXY IR/ROXICODONE) 5 MG immediate release tablet Take 1 tablet (5 mg total) by mouth every 8 (eight) hours as needed for moderate pain. 09/16/16  Yes Enid Baas, MD  OXYGEN 4 L/min by Intratracheal route as needed (for shortness of breath).    Yes Historical Provider, MD  QUEtiapine (SEROQUEL) 50 MG tablet Take 50 mg by mouth at bedtime.   Yes Historical Provider, MD  sertraline (ZOLOFT) 50 MG tablet Take 75 mg by mouth daily.    Yes Historical Provider, MD  SYMBICORT 160-4.5 MCG/ACT inhaler Inhale 2 puffs into the lungs 2 (two) times daily. 08/04/16  Yes Historical Provider, MD  tiotropium (SPIRIVA) 18 MCG inhalation capsule Place 1  capsule (18 mcg total) into inhaler and inhale daily. 07/28/16  Yes Katharina Caper, MD  torsemide (DEMADEX) 20 MG tablet Take 20 mg by mouth daily.    Yes Historical Provider, MD    Family History  Problem Relation Age of Onset  . Stomach cancer Mother   . Hypertension Brother   . Heart attack Father      Social History  Substance Use Topics  . Smoking status: Former Smoker    Quit date: 08/30/2006  . Smokeless tobacco: Never Used  . Alcohol use 1.2 oz/week    2 Cans of beer per week     Comment: occasional use    Allergies as of 09/26/2016 - Review Complete 09/26/2016  Allergen Reaction Noted  . Ampicillin Swelling and  Other (See Comments) 08/30/2011  . Nortriptyline Other (See Comments) 09/22/2015  . Brimonidine Other (See Comments) 08/04/2014  . Cyclobenzaprine Other (See Comments) 12/03/2011  . Tizanidine Other (See Comments) 11/26/2014  . Gabapentin Rash 06/25/2013    Review of Systems:    All systems reviewed and negative except where noted in HPI.   Physical Exam:  Vital signs in last 24 hours: Temp:  [98 F (36.7 C)-98.8 F (37.1 C)] 98.8 F (37.1 C) (04/09 1524) Pulse Rate:  [80-100] 80 (04/09 1524) Resp:  [16] 16 (04/08 2300) BP: (122-150)/(60-75) 125/60 (04/09 1524) SpO2:  [89 %-98 %] 98 % (04/09 1524) Last BM Date: 09/26/16 General:   Pleasant, cooperative in NAD Head:  Normocephalic and atraumatic. Eyes:   No icterus.   Conjunctiva pink. PERRLA. Ears:  Normal auditory acuity. Neck:  Supple; no masses or thyroidomegaly, Previous tracheostomy site seen. Lungs: Respirations even and unlabored. Diffuse rhonchi.   No wheezes or crackles.  Heart:  Regular rate and rhythm;  Without murmur, clicks, rubs or gallops Abdomen:  Soft, nondistended, nontender. Normal bowel sounds. No appreciable masses or hepatomegaly.  No rebound or guarding. The patient has a healing PEG site. Rectal:  Not performed. Msk:  Symmetrical without gross deformities.    Extremities:   Without edema, cyanosis or clubbing. Neurologic:  Alert and oriented x3;  grossly normal neurologically. Skin:  Intact without significant lesions or rashes. Cervical Nodes:  No significant cervical adenopathy. Psych:  Alert and cooperative. Normal affect.  LAB RESULTS:  Recent Labs  09/26/16 0404 09/26/16 1420 09/27/16 0431  WBC 12.0*  --  8.4  HGB 11.7* 11.9* 12.3*  HCT 36.7*  --  37.5*  PLT 182  --  158   BMET  Recent Labs  09/26/16 0404 09/27/16 0431  NA 134* 138  K 4.0 4.5  CL 94* 98*  CO2 33* 33*  GLUCOSE 119* 101*  BUN 42* 31*  CREATININE 2.09* 1.35*  CALCIUM 8.6* 8.4*   LFT  Recent Labs  09/26/16 0404  PROT 6.5  ALBUMIN 3.8  AST 22  ALT 17  ALKPHOS 44  BILITOT 1.3*   PT/INR No results for input(s): LABPROT, INR in the last 72 hours.  STUDIES: Dg Chest 1 View  Result Date: 09/26/2016 CLINICAL DATA:  Larey Seat earlier tonight.  Hematemesis. EXAM: CHEST 1 VIEW COMPARISON:  08/19/2016 FINDINGS: Moderate right hemidiaphragm elevation, unchanged. Moderate cardiomegaly, unchanged. No confluent airspace consolidation. No large effusion. Normal pulmonary vasculature. Multiple remote healed fracture deformities of left ribs. IMPRESSION: Stable cardiomegaly.  No confluent consolidation or large effusion. Electronically Signed   By: Ellery Plunk M.D.   On: 09/26/2016 04:44   Ct Head Wo Contrast  Result Date: 09/26/2016 CLINICAL DATA:  71 y/o M; fall with hematoma to top of head and altered mental status. EXAM: CT HEAD WITHOUT CONTRAST CT CERVICAL SPINE WITHOUT CONTRAST TECHNIQUE: Multidetector CT imaging of the head and cervical spine was performed following the standard protocol without intravenous contrast. Multiplanar CT image reconstructions of the cervical spine were also generated. COMPARISON:  09/15/2016 MRI of the brain and cervical spine. 07/16/2016 CT of the head. CT of cervical spine dated 03/03/2016. FINDINGS: CT HEAD FINDINGS Brain: No evidence of acute  infarction, hemorrhage, hydrocephalus, extra-axial collection or mass lesion/mass effect. Small left cerebellar chronic lacunar infarct and small right frontal cortical infarct are stable. Scattered punctate calcifications are present over the right cerebral convexity stable from prior study probably representing sequelae of prior infectious or inflammatory process. Vascular:  Extensive calcific atherosclerosis of the cavernous and paraclinoid internal carotid arteries. Skull: Scalp soft tissue thickening of the superior frontal region compatible with contusion. No displaced calvarial fracture. CT CERVICAL SPINE FINDINGS Alignment: Stable grade 1 C2-3 anterolisthesis and mild reversal of curvature apex at C3-4. Skull base and vertebrae:  No acute fracture identified. Soft tissues and spinal canal: Extensive calcific atherosclerosis of the carotid bifurcations bilaterally. Disc levels: Occipital the T2 posterior instrumented fusion fixed by multiple facet screws. There is no apparent hardware related complication. There is C2 through C5 facet effusion bilaterally. There is prominent productive degenerative changes of the upper cervical spine most pronounced at the C3 through C5 levels. Posterior decompressive changes are present at the C1 through C6 levels. Incomplete atlantooccipital assimilation may be congenital or traumatic. Upper chest: Negative. Other: Negative appear IMPRESSION: 1. No acute intracranial abnormality identified. 2. Small scalp contusion in the superior frontal region. No displaced calvarial fracture. 3. 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 4. No acute fracture of the cervical spine. 5. Stable postsurgical changes related to occipital to T2 fusion, C1 the C6 posterior decompression. No apparent hardware related complication. 6. Stable advanced cervical spondylosis greatest at the C3 through C5 levels. Electronically Signed    By: Mitzi Hansen M.D.   On: 09/26/2016 06:33   Ct Cervical Spine Wo Contrast  Result Date: 09/26/2016 CLINICAL DATA:  71 y/o M; fall with hematoma to top of head and altered mental status. EXAM: CT HEAD WITHOUT CONTRAST CT CERVICAL SPINE WITHOUT CONTRAST TECHNIQUE: Multidetector CT imaging of the head and cervical spine was performed following the standard protocol without intravenous contrast. Multiplanar CT image reconstructions of the cervical spine were also generated. COMPARISON:  09/15/2016 MRI of the brain and cervical spine. 07/16/2016 CT of the head. CT of cervical spine dated 03/03/2016. FINDINGS: CT HEAD FINDINGS Brain: No evidence of acute infarction, hemorrhage, hydrocephalus, extra-axial collection or mass lesion/mass effect. Small left cerebellar chronic lacunar infarct and small right frontal cortical infarct are stable. Scattered punctate calcifications are present over the right cerebral convexity stable from prior study probably representing sequelae of prior infectious or inflammatory process. Vascular: Extensive calcific atherosclerosis of the cavernous and paraclinoid internal carotid arteries. Skull: Scalp soft tissue thickening of the superior frontal region compatible with contusion. No displaced calvarial fracture. CT CERVICAL SPINE FINDINGS Alignment: Stable grade 1 C2-3 anterolisthesis and mild reversal of curvature apex at C3-4. Skull base and vertebrae:  No acute fracture identified. Soft tissues and spinal canal: Extensive calcific atherosclerosis of the carotid bifurcations bilaterally. Disc levels: Occipital the T2 posterior instrumented fusion fixed by multiple facet screws. There is no apparent hardware related complication. There is C2 through C5 facet effusion bilaterally. There is prominent productive degenerative changes of the upper cervical spine most pronounced at the C3 through C5 levels. Posterior decompressive changes are present at the C1 through C6  levels. Incomplete atlantooccipital assimilation may be congenital or traumatic. Upper chest: Negative. Other: Negative appear IMPRESSION: 1. No acute intracranial abnormality identified. 2. Small scalp contusion in the superior frontal region. No displaced calvarial fracture. 3. 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 4. No acute fracture of the cervical spine. 5. Stable postsurgical changes related to occipital to T2 fusion, C1 the C6 posterior decompression. No apparent hardware related complication. 6. Stable advanced cervical spondylosis greatest at the C3 through C5 levels. Electronically Signed   By: Buzzy Han.D.  On: 09/26/2016 06:33   Mr Brain Wo Contrast  Result Date: 09/26/2016 CLINICAL DATA:  Initial evaluation for acute altered mental status, fall. EXAM: MRI HEAD WITHOUT CONTRAST TECHNIQUE: Multiplanar, multiecho pulse sequences of the brain and surrounding structures were obtained without intravenous contrast. COMPARISON:  [facility] CT from earlier the same day. FINDINGS: Brain: Study fairly degraded by motion artifact. Diffuse prominence of the CSF containing spaces is compatible with generalized age-related cerebral atrophy. Mild chronic microvascular changes present within the periventricular and deep white matter. Focal encephalomalacia within the anterior right frontal lobe compatible with remote right MCA territory infarct. Small remote left cerebellar infarct noted as well. No abnormal foci of restricted diffusion to suggest acute or subacute infarct. Gray-white matter differentiation maintained. No evidence for acute intracranial hemorrhage. No mass lesion, midline shift or mass effect. No hydrocephalus. No extra-axial fluid collection. Vascular: Major intracranial vascular flow voids grossly maintained. Right vertebral artery diminutive. Skull and upper cervical spine: Craniocervical junction grossly  unremarkable. Susceptibility artifact from posterior spinal fusion hardware noted within the upper cervical spine. Bone marrow signal intensity within normal limits. Small frontal scalp contusion noted he vertex. Sinuses/Orbits: Globes and orbital soft tissues within normal limits. Paranasal sinuses are clear. Small right mastoid effusion noted. Other: None. IMPRESSION: 1. No acute intracranial process identified. 2. Small frontal scalp contusion near the vertex. 3. Remote right MCA and left cerebellar infarcts. 4. Mild chronic microvascular ischemic disease. Electronically Signed   By: Rise Mu M.D.   On: 09/26/2016 23:05      Impression / Plan:   Chad Huffman is a 71 y.o. y/o male with Please admitted with a fall and possible COPD exacerbation.  The patient had reported one episode of vomiting with what he believes to be blood.  The patient's hemoglobin has been stable and he has had no further sign of any bleeding.  Due to the patient's confusion and inability to ascertain whether he had hematemesis or not the patient will be set up for an EGD for tomorrow. I have discussed risks & benefits which include, but are not limited to, bleeding, infection, perforation & drug reaction.  The patient agrees with this plan & written consent will be obtained.      Thank you for involving me in the care of this patient.      LOS: 1 day   Midge Minium, MD  09/27/2016, 4:58 PM   Note: This dictation was prepared with Dragon dictation along with smaller phrase technology. Any transcriptional errors that result from this process are unintentional.

## 2016-09-27 NOTE — Consult Note (Signed)
[facility] Face-to-Face Psychiatry Consult   Reason for Consult:  Consult for 71 year old man with multiple medical problems. Yesterday was acutely delirious Referring Physician:  Bridgett Larsson Patient Identification: Chad Huffman MRN:  [id number] Principal Diagnosis: Acute delirium Diagnosis:   Patient Active Problem List   Diagnosis Date Noted  . Acute delirium [R41.0] 09/27/2016  . Hematemesis without nausea [K92.0]   . Pressure injury of skin [L89.90] 09/14/2016  . Weakness [R53.1] 09/13/2016  . UTI (urinary tract infection) [N39.0] 09/13/2016  . Chronic diastolic CHF (congestive heart failure) ([place]) [I50.32] 09/13/2016  . COPD (chronic obstructive pulmonary disease) ([place]) [J44.9] 09/13/2016  . Carotid stenosis [I65.29] 09/09/2016  . Carotid artery stenosis [I65.29] 08/23/2016  . Pancytopenia ([place]) [A41.660] 08/23/2016  . Hyperkalemia [E87.5] 08/23/2016  . Acute on chronic diastolic CHF (congestive heart failure) ([place]) [I50.33] 07/27/2016  . Leukocytosis [D72.829] 07/27/2016  . Thrombocytopenia ([place]) [D69.6] 07/27/2016  . Anemia [D64.9] 07/27/2016  . Acute on chronic respiratory failure with hypoxia and hypercapnia (HCC) [Y30.16, J96.22] 07/16/2016  . Status post insertion of percutaneous endoscopic gastrostomy (PEG) tube ([place]) [Z93.1] 05/30/2016  . Dysphagia, pharyngoesophageal phase [R13.14] 05/18/2016  . GERD without esophagitis [K21.9] 05/09/2016  . Bilateral lower extremity edema [R60.0] 05/09/2016  . Slow transit constipation [K59.01] 05/09/2016  . Depression with anxiety [F41.8] 05/09/2016  . Respiratory failure with hypoxia and hypercapnia ([place]) [J96.91, J96.92] 07/22/2015  . Cervical vertebral fusion [M43.22]   . Depression [F32.9]   . Chronic neck and back pain [M54.2, M54.9]   . Acute on chronic respiratory failure with hypercapnia (HCC) [J96.22]   . COPD exacerbation ([place]) [J44.1]   . HCAP (healthcare-associated pneumonia) [J18.9]   . Congestive dilated cardiomyopathy ([place])  [I42.0]   . Syncope and collapse [R55]   . Arthritis of right hip [M16.11] 08/04/2014  . Essential hypertension [I10] 08/04/2014  . Hypothyroidism [E03.9] 08/04/2014  . Syncope [R55] 08/04/2014  . Fall [W19.XXXA] 08/04/2014    Total Time spent with patient: 1 hour  Subjective:   Chad GWYNNE is a 71 y.o. male patient admitted with "I was incoherent".  HPI:  Patient interviewed chart reviewed. 71 year old man who is in the hospital after a fall also with COPD and blood tinged vomiting low oxygen. Yesterday evening the patient became very confused and was described as yelling thrashing around on redirectable. On interview today I found the patient calm alert oriented and fairly clearheaded. He was able to tell me that he was in the hospital for bleeding from the stomach and a fall and dizziness at home. Patient says he's had a lot of falls recently. At least one time he knows he is hit his head. Psychiatrically he denies any mood symptoms. Says his mood is been good. Chronic sleep which is not so good at night. Appetite adequate. Totally denies any suicidal thoughts. Denies having had any hallucinations at home. On interview today the patient is alert and oriented to his situation and to the hospital and to the date. As having any hallucinations or confusion today but has an awareness that when he gets sick he can be incoherent.  Social history: Patient lives by himself although it sounds like he has a lot of family and friends who help him out in various ways. He is a retired Development worker, community carrier.  Medical history: Had neck surgery several years ago after which he has developed multiple other medical problems. He is oxygen dependent because of his COPD at home. He has had chronic problems with dizziness and falls and  COPD. Hypertension. No documented history of a stroke. 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  Substance abuse history: Says that he only drinks about 2 beers a month. Denies use of any other drugs. Says he did drink more than the past but didn't think it was ever a problem.  Past Psychiatric History: I saw this patient once previously in a consult several years ago because of confusion which appeared to be mostly medication related. There is no history of psychiatric hospitalization depression or suicidal behavior.  Risk to Self: Is patient at risk for suicide?: No Risk to Others:   Prior Inpatient Therapy:   Prior Outpatient Therapy:    Past Medical History:  Past Medical History:  Diagnosis Date  . Antalgic gait   . Carotid artery occlusion   . Chronic neck and back pain   . Complication of anesthesia    pt has had cervical fusion-limited neck flextion  . COPD (chronic obstructive pulmonary disease) ([place])   . Depression   . DJD (degenerative joint disease)    Right shoulder  . Hypertension   . Lumbago   . Peripheral neuropathy [facility])     Past Surgical History:  Procedure Laterality Date  . ABDOMINAL AORTIC ANEURYSM REPAIR  2009   Endovascular AAA repair  . ABDOMINAL AORTIC ANEURYSM REPAIR    . CAROTID ANGIOGRAPHY  09/09/2016   Procedure: Carotid Angiography;  Surgeon: Serafina Mitchell, MD;  Location: [place] [facility];  Service: Cardiovascular;;  rt. carotid  . CARPAL TUNNEL RELEASE     bilateral CTS  . HAMMER TOE SURGERY  12/10/2011   Procedure: HAMMER TOE CORRECTION;  Surgeon: Tyson Dense, DPM;  Location: [place];  Service: Podiatry;  Laterality: Right;  Right hammertoe repair second right with 2.5x42 orthopro screw   . IR GENERIC HISTORICAL  04/05/2016   IR GASTROSTOMY TUBE MOD SED 04/05/2016 [facility]  . IR GENERIC HISTORICAL  05/12/2016   IR CM INJ ANY COLONIC TUBE W/FLUORO 05/12/2016 Jacqulynn Cadet, MD [facility]  . IR GENERIC HISTORICAL  05/26/2016   IR CM INJ ANY COLONIC TUBE W/FLUORO 05/26/2016 [facility]  .  IR GENERIC HISTORICAL  05/28/2016   IR [place] GASTRO/COLONIC TUBE PERCUT W/FLUORO 05/28/2016 Ascencion Dike, PA-C [facility]  . IR GENERIC HISTORICAL  07/02/2016   IR GASTROSTOMY TUBE REMOVAL 07/02/2016 Ascencion Dike, PA-C [facility]  . METATARSAL OSTEOTOMY  12/10/2011   Procedure: METATARSAL OSTEOTOMY;  Surgeon: Tyson Dense, DPM;  Location: [place];  Service: Podiatry;  Laterality: Right;  Right Keller arthroplasty with size 5 silicone implant right great toe; second metatarsal osteotomy with 2.0x14 screw  . NECK SURGERY    . PERIPHERAL VASCULAR CATHETERIZATION N/A 02/11/2015   Procedure: Carotid Angiography;  Surgeon: Serafina Mitchell, MD;  Location: [place] [facility];  Service: Cardiovascular;  Laterality: N/A;  . PILONIDAL CYST / SINUS EXCISION    . Rotary cuff    . SPINE SURGERY  09/15/11   Scar tissue removed- back  . SPINE SURGERY  09/15/11   Scar tissue removed- back  . TONSILLECTOMY    . TRACHEOSTOMY TUBE PLACEMENT N/A 03/03/2016   Procedure: TRACHEOSTOMY;  Surgeon: Clyde Canterbury, MD;  Location: [facility];  Service: ENT;  Laterality: N/A;   Family History:  Family History  Problem Relation Age of Onset  . Stomach cancer  Mother   . Hypertension Brother   . Heart attack Father    Family Psychiatric  History: No known family history Social History:  History  Alcohol Use  . 1.2 oz/week  . 2 Cans of beer per week    Comment: occasional use     History  Drug Use No    Social History   Social History  . Marital status: Single    Spouse name: N/A  . Number of children: N/A  . Years of education: N/A   Social History Main Topics  . Smoking status: Former Smoker    Quit date: 08/30/2006  . Smokeless tobacco: Never Used  . Alcohol use 1.2 oz/week    2 Cans of beer per week     Comment: occasional use  . Drug use: No  . Sexual activity: Not Asked   Other Topics Concern  . None   Social History Narrative  . None   Additional Social History:     Allergies:   Allergies  Allergen Reactions  . Ampicillin Swelling and Other (See Comments)    Unable to obtain enough information to answer additional questions about this medication.  Has patient had a PCN reaction causing immediate rash, facial/tongue/throat swelling, SOB or lightheadedness with hypotension: Yes Has patient had a PCN reaction causing severe rash involving mucus membranes or skin necrosis: No Has patient had a PCN reaction that required hospitalization No Has patient had a PCN reaction occurring within the last 10 years: Yes If all of the above answers are "NO", then may pr  . Nortriptyline Other (See Comments)    Sleep walking  . Brimonidine Other (See Comments)    Unknown reaction per MAR   . Cyclobenzaprine Other (See Comments)    Hallucinations  . Tizanidine Other (See Comments)    Hallucinations  . Gabapentin Rash    Labs:  Results for orders placed or performed during the hospital encounter of 09/26/16 (from the past 48 hour(s))  Lipase, blood     Status: None   Collection Time: 09/26/16  4:04 AM  Result Value Ref Range   Lipase 15 11 - 51 U/L  Comprehensive metabolic panel     Status: Abnormal   Collection Time: 09/26/16  4:04 AM  Result Value Ref Range   Sodium 134 (L) 135 - 145 mmol/L   Potassium 4.0 3.5 - 5.1 mmol/L   Chloride 94 (L) 101 - 111 mmol/L   CO2 33 (H) 22 - 32 mmol/L   Glucose, Bld 119 (H) 65 - 99 mg/dL   BUN 42 (H) 6 - 20 mg/dL   Creatinine, Ser 2.09 (H) 0.61 - 1.24 mg/dL   Calcium 8.6 (L) 8.9 - 10.3 mg/dL   Total Protein 6.5 6.5 - 8.1 g/dL   Albumin 3.8 3.5 - 5.0 g/dL   AST 22 15 - 41 U/L   ALT 17 17 - 63 U/L   Alkaline Phosphatase 44 38 - 126 U/L   Total Bilirubin 1.3 (H) 0.3 - 1.2 mg/dL   GFR calc non Af Amer 31 (L) >60 mL/min   GFR calc Af Amer 36 (L) >60 mL/min    Comment: (NOTE) The eGFR has been calculated using the CKD EPI equation. This calculation has not been validated in all clinical situations. eGFR's  persistently <60 mL/min signify possible Chronic Kidney Disease.    Anion gap 7 5 - 15  CBC     Status: Abnormal   Collection Time: 09/26/16  4:04 AM  Result Value Ref Range   WBC 12.0 (H) 3.8 - 10.6 K/uL   RBC 3.99 (L) 4.40 - 5.90 MIL/uL   Hemoglobin 11.7 (L) 13.0 - 18.0 g/dL   HCT 36.7 (L) 40.0 - 52.0 %   MCV 91.9 80.0 - 100.0 fL   MCH 29.3 26.0 - 34.0 pg   MCHC 31.9 (L) 32.0 - 36.0 g/dL   RDW 17.6 (H) 11.5 - 14.5 %   Platelets 182 150 - 440 K/uL  Magnesium     Status: Abnormal   Collection Time: 09/26/16  4:04 AM  Result Value Ref Range   Magnesium 3.0 (H) 1.7 - 2.4 mg/dL  Blood gas, venous     Status: Abnormal   Collection Time: 09/26/16  4:24 AM  Result Value Ref Range   pH, Ven 7.37 7.250 - 7.430   pCO2, Ven 66 (H) 44.0 - 60.0 mmHg   pO2, Ven 70.0 (H) 32.0 - 45.0 mmHg   Bicarbonate 38.2 (H) 20.0 - 28.0 mmol/L   Acid-Base Excess 10.5 (H) 0.0 - 2.0 mmol/L   O2 Saturation 93.3 %   Patient temperature 37.0    Collection site LINE    Sample type VENOUS   Urinalysis, Complete w Microscopic     Status: Abnormal   Collection Time: 09/26/16  4:36 AM  Result Value Ref Range   Color, Urine YELLOW (A) YELLOW   APPearance CLEAR (A) CLEAR   Specific Gravity, Urine 1.014 1.005 - 1.030   pH 7.0 5.0 - 8.0   Glucose, UA NEGATIVE NEGATIVE mg/dL   Hgb urine dipstick NEGATIVE NEGATIVE   Bilirubin Urine NEGATIVE NEGATIVE   Ketones, ur NEGATIVE NEGATIVE mg/dL   Protein, ur NEGATIVE NEGATIVE mg/dL   Nitrite NEGATIVE NEGATIVE   Leukocytes, UA NEGATIVE NEGATIVE   RBC / HPF 0-5 0 - 5 RBC/hpf   WBC, UA 0-5 0 - 5 WBC/hpf   Bacteria, UA NONE SEEN NONE SEEN   Squamous Epithelial / LPF NONE SEEN NONE SEEN  Urine Drug Screen, Qualitative ([facility] only)     Status: None   Collection Time: 09/26/16  4:36 AM  Result Value Ref Range   Tricyclic, Ur Screen NONE DETECTED NONE DETECTED   Amphetamines, Ur Screen NONE DETECTED NONE DETECTED   MDMA (Ecstasy)Ur Screen NONE DETECTED NONE DETECTED    Cocaine Metabolite,Ur [place] NONE DETECTED NONE DETECTED   Opiate, Ur Screen NONE DETECTED NONE DETECTED   Phencyclidine (PCP) Ur S NONE DETECTED NONE DETECTED   Cannabinoid 50 Ng, Ur [place] NONE DETECTED NONE DETECTED   Barbiturates, Ur Screen NONE DETECTED NONE DETECTED   Benzodiazepine, Ur Scrn NONE DETECTED NONE DETECTED   Methadone Scn, Ur NONE DETECTED NONE DETECTED    Comment: (NOTE) 062  Tricyclics, urine               Cutoff 1000 ng/mL 200  Amphetamines, urine             Cutoff 1000 ng/mL 300  MDMA (Ecstasy), urine           Cutoff 500 ng/mL 400  Cocaine Metabolite, urine       Cutoff 300 ng/mL 500  Opiate, urine                   Cutoff 300 ng/mL 600  Phencyclidine (PCP), urine      Cutoff 25 ng/mL 700  Cannabinoid, urine              Cutoff 50 ng/mL 800  Barbiturates, urine  Cutoff 200 ng/mL 900  Benzodiazepine, urine           Cutoff 200 ng/mL 1000 Methadone, urine                Cutoff 300 ng/mL 1100 1200 The urine drug screen provides only a preliminary, unconfirmed 1300 analytical test result and should not be used for non-medical 1400 purposes. Clinical consideration and professional judgment should 1500 be applied to any positive drug screen result due to possible 1600 interfering substances. A more specific alternate chemical method 1700 must be used in order to obtain a confirmed analytical result.  1800 Gas chromato graphy / mass spectrometry (GC/MS) is the preferred 1900 confirmatory method.   Blood gas, arterial     Status: Abnormal (Preliminary result)   Collection Time: 09/26/16  1:20 PM  Result Value Ref Range   FIO2 0.28    Delivery systems NASAL CANNULA    pH, Arterial 7.38 7.350 - 7.450   pCO2 arterial 62 (H) 32.0 - 48.0 mmHg   pO2, Arterial 72 (L) 83.0 - 108.0 mmHg   Bicarbonate 36.7 (H) 20.0 - 28.0 mmol/L   Acid-Base Excess 9.5 (H) 0.0 - 2.0 mmol/L   O2 Saturation 93.9 %   Patient temperature 37.0    Collection site LEFT RADIAL    Sample  type ARTERIAL DRAW    Allens test (pass/fail) PENDING PASS   Mechanical Rate PENDING   Glucose, capillary     Status: Abnormal   Collection Time: 09/26/16  1:21 PM  Result Value Ref Range   Glucose-Capillary 156 (H) 65 - 99 mg/dL  Magnesium     Status: Abnormal   Collection Time: 09/26/16  2:20 PM  Result Value Ref Range   Magnesium 3.6 (H) 1.7 - 2.4 mg/dL  Ammonia     Status: None   Collection Time: 09/26/16  2:20 PM  Result Value Ref Range   Ammonia 13 9 - 35 umol/L  Hemoglobin     Status: Abnormal   Collection Time: 09/26/16  2:20 PM  Result Value Ref Range   Hemoglobin 11.9 (L) 13.0 - 18.0 g/dL  Basic metabolic panel     Status: Abnormal   Collection Time: 09/27/16  4:31 AM  Result Value Ref Range   Sodium 138 135 - 145 mmol/L   Potassium 4.5 3.5 - 5.1 mmol/L   Chloride 98 (L) 101 - 111 mmol/L   CO2 33 (H) 22 - 32 mmol/L   Glucose, Bld 101 (H) 65 - 99 mg/dL   BUN 31 (H) 6 - 20 mg/dL   Creatinine, Ser 1.35 (H) 0.61 - 1.24 mg/dL   Calcium 8.4 (L) 8.9 - 10.3 mg/dL   GFR calc non Af Amer 52 (L) >60 mL/min   GFR calc Af Amer >60 >60 mL/min    Comment: (NOTE) The eGFR has been calculated using the CKD EPI equation. This calculation has not been validated in all clinical situations. eGFR's persistently <60 mL/min signify possible Chronic Kidney Disease.    Anion gap 7 5 - 15  CBC     Status: Abnormal   Collection Time: 09/27/16  4:31 AM  Result Value Ref Range   WBC 8.4 3.8 - 10.6 K/uL   RBC 3.97 (L) 4.40 - 5.90 MIL/uL   Hemoglobin 12.3 (L) 13.0 - 18.0 g/dL   HCT 37.5 (L) 40.0 - 52.0 %   MCV 94.4 80.0 - 100.0 fL   MCH 31.0 26.0 - 34.0 pg   MCHC 32.9 32.0 -  36.0 g/dL   RDW 17.7 (H) 11.5 - 14.5 %   Platelets 158 150 - 440 K/uL  Magnesium     Status: Abnormal   Collection Time: 09/27/16  4:31 AM  Result Value Ref Range   Magnesium 2.9 (H) 1.7 - 2.4 mg/dL    Current Facility-Administered Medications  Medication Dose Route Frequency Provider Last Rate Last Dose  .  0.9 %  sodium chloride infusion   Intravenous Continuous Demetrios Loll, MD 100 mL/hr at 09/27/16 1800    . acetaminophen (TYLENOL) tablet 650 mg  650 mg Oral Q6H PRN Demetrios Loll, MD       Or  . acetaminophen (TYLENOL) suppository 650 mg  650 mg Rectal Q6H PRN Demetrios Loll, MD      . albuterol (PROVENTIL) (2.5 MG/3ML) 0.083% nebulizer solution 2.5 mg  2.5 mg Nebulization Q2H PRN Demetrios Loll, MD      . bisacodyl (DULCOLAX) EC tablet 5 mg  5 mg Oral Daily PRN Demetrios Loll, MD      . budesonide (PULMICORT) nebulizer solution 0.5 mg  0.5 mg Nebulization BID Demetrios Loll, MD   0.5 mg at 09/27/16 0732  . folic acid (FOLVITE) tablet 1 mg  1 mg Oral Daily Demetrios Loll, MD   1 mg at 09/27/16 0109  . haloperidol lactate (HALDOL) injection 1 mg  1 mg Intravenous Q6H PRN Gonzella Lex, MD      . HYDROcodone-acetaminophen (NORCO/VICODIN) 5-325 MG per tablet 1-2 tablet  1-2 tablet Oral Q4H PRN Demetrios Loll, MD      . ketorolac (TORADOL) 15 MG/ML injection 15 mg  15 mg Intravenous Q6H PRN Demetrios Loll, MD      . levothyroxine (SYNTHROID, LEVOTHROID) tablet 75 mcg  75 mcg Oral Q0600 Demetrios Loll, MD   75 mcg at 09/27/16 0604  . methylPREDNISolone sodium succinate (SOLU-MEDROL) 125 mg/2 mL injection 60 mg  60 mg Intravenous Q6H Demetrios Loll, MD   60 mg at 09/27/16 1522  . ondansetron (ZOFRAN) tablet 4 mg  4 mg Oral Q6H PRN Demetrios Loll, MD       Or  . ondansetron [facility]) injection 4 mg  4 mg Intravenous Q6H PRN Demetrios Loll, MD   4 mg at 09/26/16 1652  . oxyCODONE (Oxy IR/ROXICODONE) immediate release tablet 5 mg  5 mg Oral Q8H PRN Demetrios Loll, MD   5 mg at 09/27/16 1237  . pantoprazole (PROTONIX) injection 40 mg  40 mg Intravenous Q12H Demetrios Loll, MD   40 mg at 09/27/16 0905  . QUEtiapine (SEROQUEL) tablet 50 mg  50 mg Oral QHS Demetrios Loll, MD      . senna-docusate (Senokot-S) tablet 1 tablet  1 tablet Oral QHS PRN Demetrios Loll, MD      . sertraline (ZOLOFT) tablet 75 mg  75 mg Oral Daily Demetrios Loll, MD   75 mg at 09/27/16 0906  . sodium chloride flush  (NS) 0.9 % injection 3 mL  3 mL Intravenous Q12H Demetrios Loll, MD   3 mL at 09/27/16 0910  . tiotropium (SPIRIVA) inhalation capsule 18 mcg  18 mcg Inhalation Daily Demetrios Loll, MD   18 mcg at 09/27/16 0906    Musculoskeletal: Strength & Muscle Tone: decreased Gait & Station: unsteady Patient leans: Backward  Psychiatric Specialty Exam: Physical Exam  Nursing note and vitals reviewed. Constitutional: He appears well-developed and well-nourished.  HENT:  Head: Normocephalic and atraumatic.  Eyes: Conjunctivae are normal. Pupils are equal, round, and reactive to light.  Neck:  Normal range of motion.  Cardiovascular: Normal heart sounds.   Respiratory: He is in respiratory distress.  GI: Soft.  Musculoskeletal: Normal range of motion.  Neurological: He is alert.  Skin: Skin is warm and dry.  Psychiatric: He has a normal mood and affect. His speech is normal and behavior is normal. Judgment and thought content normal. He exhibits abnormal recent memory.    Review of Systems  Constitutional: Negative.   HENT: Negative.   Eyes: Negative.   Respiratory: Negative.   Cardiovascular: Negative.   Gastrointestinal: Negative.   Musculoskeletal: Negative.   Skin: Negative.   Neurological: Negative.   Psychiatric/Behavioral: Negative.     Blood pressure 125/60, pulse 80, temperature 98.8 F (37.1 C), temperature source Oral, resp. rate 16, height 5' 8"  (1.727 m), weight 94.3 kg (208 lb), SpO2 98 %.Body mass index is 31.63 kg/m.  General Appearance: Disheveled  Eye Contact:  Fair  Speech:  Slow  Volume:  Decreased  Mood:  Euthymic  Affect:  Congruent  Thought Process:  Goal Directed  Orientation:  Full (Time, Place, and Person)  Thought Content:  Logical  Suicidal Thoughts:  No  Homicidal Thoughts:  No  Memory:  Immediate;   Good Recent;   Fair Remote;   Fair  Judgement:  Fair  Insight:  Fair  Psychomotor Activity:  Decreased  Concentration:  Concentration: Fair  Recall:  [employer] of Knowledge:  Fair  Language:  Fair  Akathisia:  No  Handed:  Right  AIMS (if indicated):     Assets:  Communication Skills Desire for Improvement Financial Resources/Insurance Housing Resilience Social Support  ADL's:  Impaired  Cognition:  Impaired,  Mild  Sleep:        Treatment Plan Summary: Daily contact with patient to assess and evaluate symptoms and progress in treatment, Medication management and Plan 71 year old man with multiple medical problems who came into the hospital after a recent fall at home. He is oxygen dependent has a history of low oxygenation in the past Coleman tracheostomy all of which suggest the likelihood of chronic mild neurologic impairment. This is often a set up by think for increasing the risk of delirium. He has a past history of easily getting delirious with medication and medical problems which is what it sounds like And yesterday. I put in orders for intravenous Haldol to be used as a when necessary medicine if he were to sun down or get agitated tonight but otherwise does not require any psychiatric intervention.  Disposition: Patient does not meet criteria for psychiatric inpatient admission. Supportive therapy provided about ongoing stressors.  Alethia Berthold, MD 09/27/2016 7:13 PM

## 2016-09-27 NOTE — Progress Notes (Deleted)
PT Cancellation Note  Patient Details Name: Chad Huffman MRN: [id number] DOB: 1946/05/13   Cancelled Treatment:    Reason Eval/Treat Not Completed: Other (comment). 2nd attempt. Pt currently out of room for imaging. Will re-attempt.   [name] 09/27/2016, 10:18 AM  Elizabeth Palau, PT, DPT [phone]

## 2016-09-27 NOTE — Progress Notes (Signed)
OT Cancellation Note  Patient Details Name: Chad Huffman MRN: [id number] DOB: 1946-01-16   Cancelled Treatment:    Reason Eval/Treat Not Completed: Other (comment). Chart reviewed, spoke with PT and RN, pt has been yelling out most of the night as he is confused. Pt screaming into hallway this morning. Pt is confused and not appropriate for OT evaluation at this time. Will re-attempt OT evaluation at later date/time if able to participate.  Richrd Prime, MPH, MS, OTR/L ascom [phone] 09/27/16, 10:18 AM

## 2016-09-27 NOTE — Care Management Note (Signed)
Case Management Note  Patient Details  Name: Chad Huffman MRN: [id number] Date of Birth: [birth year]  Subjective/Objective: RNCM consult for COPD protocol. Readmit.  Admitted with COPD exacerbation and fall.  Patient from home alone. ON chronic 2L. [facility], PT and HA. PCP is Dr. Terance Hart                  Action/Plan: Following progression.   Expected Discharge Date:                  Expected Discharge Plan:     In-House Referral:     Discharge planning Services  CM Consult  Post Acute Care Choice:  Resumption of Svcs/PTA Provider Choice offered to:     DME Arranged:    DME Agency:     HH Arranged:  RN, PT, Nurse's Aide HH Agency:  [facility]  Status of Service:  In process, will continue to follow  If discussed at Long Length of Stay Meetings, dates discussed:    Additional Comments:  Marily Memos, RN 09/27/2016, 9:10 AM

## 2016-09-27 NOTE — Evaluation (Signed)
Occupational Therapy Evaluation Patient Details Name: Chad Huffman MRN: [id number] DOB: [birth year] Today's Date: 09/27/2016    History of Present Illness 71 y.o. male  known history of COPD, chronic respiratory failure on home oxygen 2 L/min, carotid artery occlusion, hypertension, chronic neck pain, DJD, and peripheral neuropathy.  Pt was found to be lethargic generalized weakness LE more then UE.    Clinical Impression   Pt seen for OT evaluation this date. Pt reports requiring RW for ambulation household distances but requiring w/c for household distances this past week for mobility. At baseline pt required friend Valere Dross, per pt report) to drive him to appointments, friends help bring groceries, gets assist for meals and cleaning, and assist from an aide for bathing and dressing every other day. When asked about med mgt, pt reports that RN will take care of it moving forward once he's back home. Pt reports 10+ falls in past 6 months due to syncopal episodes. Pt presents with pain in R shoulder/neck/back/head, decreased strength, activity tolerance, and increased need for assist for self care tasks. Pt agreeable to bed mobility, but declined functional transfers and additional self care tasks due to fatigue and increasing R shoulder pain while seated EOB. Pt would benefit from skilled OT services to address noted impairments and functional deficits in order to maximize return to PLOF, minimize future falls risk and rehospitalization.     Follow Up Recommendations  SNF    Equipment Recommendations  Other (comment) (defer to next venue of care)    Recommendations for Other Services       Precautions / Restrictions Precautions Precautions: Fall Restrictions Weight Bearing Restrictions: No      Mobility Bed Mobility Overal bed mobility: Needs Assistance Bed Mobility: Supine to Sit;Sit to Supine     Supine to sit: HOB elevated;Mod assist Sit to supine: Min assist       Transfers                 General transfer comment: deferred due to pt declining secondary to pain/fatigue    Balance Overall balance assessment: History of Falls;Needs assistance Sitting-balance support: Feet supported;No upper extremity supported Sitting balance-Leahy Scale: Good                                     ADL either performed or assessed with clinical judgement   ADL Overall ADL's : Needs assistance/impaired   Eating/Feeding Details (indicate cue type and reason): NPO Grooming: Set up;Bed level   Upper Body Bathing: Minimal assistance;Bed level;Moderate assistance   Lower Body Bathing: Moderate assistance;Maximal assistance;Bed level   Upper Body Dressing : Sitting;Set up;Min guard   Lower Body Dressing: Moderate assistance;Maximal assistance;Sit to/from Market researcher Details (indicate cue type and reason): deferred due to pt declining secondary to pain/fatigue           General ADL Comments: pt generally mod-max A for LB ADL     Vision Baseline Vision/History: Wears glasses Wears Glasses: Reading only Patient Visual Report: No change from baseline;Central vision impairment (pt reports R eye central visual impairment "can only see out of the peripheral of my R eye. 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 Elbow flex/ext and grip strength 4-/5 LUE  Deficits / Details: grossly 4-/5    Lower Extremity Assessment Lower Extremity Assessment: Defer to PT evaluation;Generalized weakness   Cervical / Trunk Assessment Cervical / Trunk Assessment: Normal   Communication Communication Communication: No difficulties   Cognition Arousal/Alertness: Awake/alert Behavior During Therapy: WFL for tasks assessed/performed Overall Cognitive Status: Within Functional Limits for tasks assessed                                     General Comments       Exercises Other Exercises Other Exercises: pt educated in use of pursed lip breathing to support minimizing SOB, maximal O2 intake with 4L O2 via nasal cannula   Shoulder Instructions      Home Living Family/patient expects to be discharged to:: Private residence Living Arrangements: Alone Available Help at Discharge: Family;Friend(s);Available PRN/intermittently (very limited assistance) Type of Home: House Home Access: Stairs to enter [employer] of Steps: 4 Entrance Stairs-Rails: Right;Left;Can reach both Home Layout: One level     Bathroom Shower/Tub: Chief Strategy Officer: Standard Bathroom Accessibility: Yes How Accessible: Accessible via walker Home Equipment: Walker - 2 wheels;Wheelchair - manual          Prior Functioning/Environment Level of Independence: Needs assistance  Gait / Transfers Assistance Needed: Pt reported that he ambulates with RW at all times and has had multiple falls (at least 10-11 in the past 6 months) secondary to syncopal episodes, however has needed the w/c for getting around the house in the past week. Pt reports that all of his falls occurred due to syncope, states "just passed out" ADL's / Homemaking Assistance Needed: pt reports that friend of aide (unclear which) assists with bathing, dressing every other day as well as helps with meals and cleaning; pt reports RN with home health will assist with med mgt once  he's back home            OT Problem List: Decreased strength;Pain;Cardiopulmonary status limiting activity;Decreased activity tolerance;Impaired balance (sitting and/or standing);Decreased knowledge of use of DME or AE      OT Treatment/Interventions: Self-care/ADL training;Energy conservation;DME and/or AE instruction;Patient/family education    OT Goals(Current goals can be found in the care plan section) Acute Rehab OT Goals Patient Stated Goal: get better OT Goal Formulation: With patient Time For Goal Achievement: 10/11/16 Potential to Achieve Goals: Good  OT Frequency: Min 1X/week   Barriers to D/C: Inaccessible home environment          Co-evaluation              End of Session    Activity Tolerance: Patient limited by fatigue Patient left: in bed;with call bell/phone within reach;with bed alarm set  OT Visit Diagnosis: Other abnormalities of gait and mobility (R26.89);Repeated falls (R29.6);History of falling (Z91.81);Muscle weakness (generalized) (M62.81);Pain Pain - part of body:  (neck, back, head)                Time: 4098-1191 OT Time Calculation (min): 31 min Charges:  OT General Charges $OT Visit: 1 Procedure OT Evaluation $OT Eval Moderate  Complexity: 1 Procedure OT Treatments $Self Care/Home Management : 8-22 mins G-Codes:     Richrd Prime, MPH, MS, OTR/L ascom [phone] 09/27/16, 4:13 PM

## 2016-09-27 NOTE — Progress Notes (Signed)
PT Cancellation Note  Patient Details Name: Chad Huffman MRN: [id number] DOB: 1945/12/29   Cancelled Treatment:    Reason Eval/Treat Not Completed: Other (comment). Chart reviewed and pt has been yelling out most of the night as he is confused. This morning this therapist hears pt screaming into hallway. Pt is confused and not appropriate for physical therapy this date. Will re-attempt next date if able to participate.   Shailene Demonbreun 09/27/2016, 9:58 AM  Elizabeth Palau, PT, DPT [phone]

## 2016-09-27 NOTE — Progress Notes (Signed)
[facility] [place] [facility]   PATIENT NAME: Chad Huffman    MR#:  [id number]  DATE OF BIRTH:  05/11/1946  SUBJECTIVE:  CHIEF COMPLAINT:   Chief Complaint  Patient presents with  . Emesis  . Fall   Cough and shortness of breath,  On O2 [place] 4L. The patient was very agitated last night, he is calm today. He is alert, awake but looks confused. He mentioned that he had bloody vomitus before this admission. But no bleeding today. REVIEW OF SYSTEMS:  Review of Systems  Constitutional: Positive for malaise/fatigue. Negative for chills and fever.  Eyes: Negative for blurred vision and double vision.  Respiratory: Positive for cough and shortness of breath. Negative for hemoptysis, sputum production, wheezing and stridor.   Cardiovascular: Negative for chest pain and leg swelling.  Gastrointestinal: Negative for abdominal pain, blood in stool, diarrhea, melena, nausea and vomiting.  Genitourinary: Negative for dysuria and hematuria.  Musculoskeletal: Negative for back pain.  Skin: Negative for itching and rash.  Neurological: Positive for weakness. Negative for dizziness, focal weakness, loss of consciousness and headaches.  Psychiatric/Behavioral: Negative for depression. The patient is not nervous/anxious.     DRUG ALLERGIES:   Allergies  Allergen Reactions  . Ampicillin Swelling and Other (See Comments)    Unable to obtain enough information to answer additional questions about this medication.  Has patient had a PCN reaction causing immediate rash, facial/tongue/throat swelling, SOB or lightheadedness with hypotension: Yes Has patient had a PCN reaction causing severe rash involving mucus membranes or skin necrosis: No Has patient had a PCN reaction that required hospitalization No Has patient had a PCN reaction occurring within the last 10 years: Yes If all of the above answers are "NO", then may pr  . Nortriptyline Other (See Comments)    Sleep walking    . Brimonidine Other (See Comments)    Unknown reaction per MAR   . Cyclobenzaprine Other (See Comments)    Hallucinations  . Tizanidine Other (See Comments)    Hallucinations  . Gabapentin Rash   VITALS:  Blood pressure 125/60, pulse 80, temperature 98.8 F (37.1 C), temperature source Oral, resp. rate 16, height  (1.727 m), weight 208 lb (94.3 kg), SpO2 98 %. PHYSICAL EXAMINATION:  Physical Exam  Constitutional: He is oriented to person, place, and time and well-developed, well-nourished, and in no distress.  HENT:  Head: Normocephalic.  Mouth/Throat: Oropharynx is clear and moist.  Eyes: Conjunctivae and EOM are normal.  Neck: Normal range of motion. Neck supple. No JVD present. No tracheal deviation present.  Cardiovascular: Normal rate, regular rhythm and normal heart sounds.  Exam reveals no gallop.   No murmur heard. Pulmonary/Chest: No respiratory distress. He has no wheezes. He has no rales.  Diminished lung sounds.  Abdominal: Soft. Bowel sounds are normal. He exhibits no distension. There is no tenderness. There is no rebound.  Musculoskeletal: Normal range of motion. He exhibits no edema or tenderness.  Neurological: He is alert and oriented to person, place, and time. No cranial nerve deficit.  Skin: No rash noted. No erythema.  Psychiatric:  He looks confused.   LABORATORY PANEL:  Male CBC  Recent Labs Lab 09/27/16 0431  WBC 8.4  HGB 12.3*  HCT 37.5*  PLT 158   ------------------------------------------------------------------------------------------------------------------ Chemistries   Recent Labs Lab 09/26/16 0404  09/27/16 0431  NA 134*  --  138  K 4.0  --  4.5  CL 94*  --  98*  CO2 33*  --  33*  GLUCOSE 119*  --  101*  BUN 42*  --  31*  CREATININE 2.09*  --  1.35*  CALCIUM 8.6*  --  8.4*  MG 3.0*  < > 2.9*  AST 22  --   --   ALT 17  --   --   ALKPHOS 44  --   --   BILITOT 1.3*  --   --   < > = values in this interval not  displayed. RADIOLOGY:  Mr Brain Wo Contrast  Result Date: 09/26/2016 CLINICAL DATA:  Initial evaluation for acute altered mental status, fall. EXAM: MRI HEAD WITHOUT CONTRAST TECHNIQUE: Multiplanar, multiecho pulse sequences of the brain and surrounding structures were obtained without intravenous contrast. COMPARISON:  [facility] CT from earlier the same day. FINDINGS: Brain: Study fairly degraded by motion artifact. Diffuse prominence of the CSF containing spaces is compatible with generalized age-related cerebral atrophy. Mild chronic microvascular changes present within the periventricular and deep white matter. Focal encephalomalacia within the anterior right frontal lobe compatible with remote right MCA territory infarct. Small remote left cerebellar infarct noted as well. No abnormal foci of restricted diffusion to suggest acute or subacute infarct. Gray-white matter differentiation maintained. No evidence for acute intracranial hemorrhage. No mass lesion, midline shift or mass effect. No hydrocephalus. No extra-axial fluid collection. Vascular: Major intracranial vascular flow voids grossly maintained. Right vertebral artery diminutive. Skull and upper cervical spine: Craniocervical junction grossly unremarkable. Susceptibility artifact from posterior spinal fusion hardware noted within the upper cervical spine. Bone marrow signal intensity within normal limits. Small frontal scalp contusion noted he vertex. Sinuses/Orbits: Globes and orbital soft tissues within normal limits. Paranasal sinuses are clear. Small right mastoid effusion noted. Other: None. IMPRESSION: 1. No acute intracranial process identified. 2. Small frontal scalp contusion near the vertex. 3. Remote right MCA and left cerebellar infarcts. 4. Mild chronic microvascular ischemic disease. Electronically Signed   By: Rise Mu M.D.   On: 09/26/2016 23:05   ASSESSMENT AND PLAN:   Acute on chronic respiratory failure with  hypercapnia due to COPD exacerbation. Taper IV Solu-Medrol, albuterol, continue Pulmicort and Spiriva. Continue oxygen by nasal cannular. Pulmonary consult.  Acute renal failure, ATN. Improving with normal saline IV, hold lisinopril, torsemide and ibuprofen. Follow up BMP.  Hypotension. Hold hypertension medication. Improved.  Acute Encephalopathy due to above. Improved. Aspiration and fall precaution. MRI of the brain is unremarkable. con't his home psychiatric medications as creatinine has improved per neurology consult.  Mild leukocytosis. Possible due to reaction. Improved.  Questionable GI bleeding. Hemoglobin is stable. Keep nothing by mouth except medication, hold aspirin and Plavix, Protonix IV twice a day, follow-up GI consult.  I discussed with neurologist, Dr. Loretha Brasil and Dr. Servando Snare.  All the records are reviewed and case discussed with Care Management/Social Worker. Management plans discussed with the patient, family and they are in agreement.  CODE STATUS: Full Code  TOTAL TIME TAKING CARE OF THIS PATIENT: 37 minutes.   More than 50% of the time was spent in counseling/coordination of care: YES  POSSIBLE D/C IN 3 DAYS, DEPENDING ON CLINICAL CONDITION.   Shaune Pollack M.D on 09/27/2016 at 3:31 PM  Between 7am to 6pm - Pager - [phone]  After 6pm go to [email or web address] - Social research officer, government  [facility]  Office  [phone]  CC: Primary care physician; Dorothey Baseman, MD  Note: This dictation was prepared with Dragon dictation along with smaller phrase technology.  Any transcriptional errors that result from this process are unintentional.

## 2016-09-28 ENCOUNTER — Encounter
Admission: EM | Disposition: A | Discharge status: Home / Self Care | Payer: Self-pay | Source: Home / Self Care | Attending: Internal Medicine

## 2016-09-28 ENCOUNTER — Inpatient Hospital Stay: Payer: Medicare Other | Admitting: Anesthesiology

## 2016-09-28 ENCOUNTER — Encounter: Payer: Self-pay | Admitting: Anesthesiology

## 2016-09-28 HISTORY — PX: ESOPHAGOGASTRODUODENOSCOPY (EGD) WITH PROPOFOL: SHX5813

## 2016-09-28 LAB — BLOOD GAS, ARTERIAL
ACID-BASE EXCESS: 9.5 mmol/L — AB (ref 0.0–2.0)
Bicarbonate: 36.7 mmol/L — ABNORMAL HIGH (ref 20.0–28.0)
FIO2: 0.28
O2 SAT: 93.9 %
PATIENT TEMPERATURE: 37
PCO2 ART: 62 mmHg — AB (ref 32.0–48.0)
pH, Arterial: 7.38 (ref 7.350–7.450)
pO2, Arterial: 72 mmHg — ABNORMAL LOW (ref 83.0–108.0)

## 2016-09-28 LAB — BASIC METABOLIC PANEL
Anion gap: 6 (ref 5–15)
BUN: 38 mg/dL — ABNORMAL HIGH (ref 6–20)
CHLORIDE: 98 mmol/L — AB (ref 101–111)
CO2: 33 mmol/L — AB (ref 22–32)
Calcium: 8.4 mg/dL — ABNORMAL LOW (ref 8.9–10.3)
Creatinine, Ser: 1.27 mg/dL — ABNORMAL HIGH (ref 0.61–1.24)
GFR calc Af Amer: >60 mL/min (ref >60)
GFR calc non Af Amer: 56 mL/min — ABNORMAL LOW (ref >60)
GLUCOSE: 110 mg/dL — AB (ref 65–99)
POTASSIUM: 4.8 mmol/L (ref 3.5–5.1)
Sodium: 137 mmol/L (ref 135–145)

## 2016-09-28 LAB — HEMOGLOBIN: Hemoglobin: 10.7 g/dL — ABNORMAL LOW (ref 13.0–18.0)

## 2016-09-28 SURGERY — ESOPHAGOGASTRODUODENOSCOPY (EGD) WITH PROPOFOL
Anesthesia: General

## 2016-09-28 MED ORDER — PROPOFOL 500 MG/50ML IV EMUL
INTRAVENOUS | Status: AC
  Filled 2016-09-28: qty 50

## 2016-09-28 MED ORDER — LIDOCAINE HCL (PF) 2 % IJ SOLN
INTRAMUSCULAR | Status: AC
  Filled 2016-09-28: qty 2

## 2016-09-28 MED ORDER — FENTANYL CITRATE (PF) 100 MCG/2ML IJ SOLN
INTRAMUSCULAR | Status: DC | PRN
  Administered 2016-09-28: 50 ug via INTRAVENOUS

## 2016-09-28 MED ORDER — GLYCOPYRROLATE 0.2 MG/ML IJ SOLN
INTRAMUSCULAR | Status: DC | PRN
  Administered 2016-09-28: 0.2 mg via INTRAVENOUS

## 2016-09-28 MED ORDER — PROPOFOL 10 MG/ML IV BOLUS
INTRAVENOUS | Status: DC | PRN
  Administered 2016-09-28: 60 mg via INTRAVENOUS

## 2016-09-28 MED ORDER — GLYCOPYRROLATE 0.2 MG/ML IJ SOLN
INTRAMUSCULAR | Status: AC
  Filled 2016-09-28: qty 1

## 2016-09-28 MED ORDER — FENTANYL CITRATE (PF) 100 MCG/2ML IJ SOLN
INTRAMUSCULAR | Status: AC
  Filled 2016-09-28: qty 2

## 2016-09-28 MED ORDER — PREDNISONE 50 MG PO TABS: 50.0000 mg | ORAL_TABLET | Freq: Every day | ORAL | Status: DC

## 2016-09-28 MED ORDER — BACLOFEN 10 MG PO TABS
40.0000 mg | ORAL_TABLET | Freq: Once | ORAL | Status: DC
  Filled 2016-09-28: qty 4

## 2016-09-28 MED ORDER — LIDOCAINE HCL (PF) 2 % IJ SOLN
INTRAMUSCULAR | Status: DC | PRN
  Administered 2016-09-28: 80 mg via INTRADERMAL

## 2016-09-28 MED ORDER — PREDNISONE 10 MG PO TABS: ORAL_TABLET | ORAL | 0 refills | Status: DC

## 2016-09-28 NOTE — Care Management Note (Signed)
Case Management Note  Patient Details  Name: Chad Huffman MRN: [id number] Date of Birth: 1946-02-29  Subjective/Objective:  Discharging today                 Action/Plan: Advanced notified of discharge with SN, PT, OT, HHA ans SW.   Expected Discharge Date:  09/28/16               Expected Discharge Plan:  Home w Home Health Services  In-House Referral:     Discharge planning Services  CM Consult  Post Acute Care Choice:  Resumption of Svcs/PTA Provider, Home Health Choice offered to:  Patient  DME Arranged:    DME Agency:     HH Arranged:  RN, PT, Nurse's Aide HH Agency:  [facility]  Status of Service:  Completed, signed off  If discussed at Long Length of Stay Meetings, dates discussed:    Additional Comments:  Marily Memos, RN 09/28/2016, 10:14 AM

## 2016-09-28 NOTE — Anesthesia Procedure Notes (Signed)
Performed by: Casey Burkitt Pre-anesthesia Checklist: Patient identified, Emergency Drugs available, Suction available, Patient being monitored and Timeout performed Patient Re-evaluated:Patient Re-evaluated prior to inductionOxygen Delivery Method: Nasal cannula Preoxygenation: Pre-oxygenation with 100% oxygen Intubation Type: IV induction Ventilation: Mask ventilation without difficulty, Two handed mask ventilation required and Nasal airway inserted- appropriate to patient size

## 2016-09-28 NOTE — Progress Notes (Signed)
Date: 09/28/2016,   MRN# [id number] Chad Huffman 07-23-1945 Code Status:     Code Status Orders        Start     Ordered   09/26/16 0852  Full code  Continuous     09/26/16 0852    Code Status History    Date Active Date Inactive Code Status Order ID Comments User Context   09/13/2016 10:29 PM 09/16/2016  3:51 PM Full Code [id number]  Oralia Manis, MD ED   09/09/2016  9:11 AM 09/10/2016  4:32 PM Full Code [id number]  Nada Libman, MD Inpatient   08/20/2016  2:06 AM 08/23/2016  9:38 PM Full Code [id number]  Tonye Royalty, DO Inpatient   07/16/2016  1:12 PM 07/27/2016  8:18 PM Full Code [id number]  Erin Fulling, MD ED   03/09/2016  5:20 PM 03/15/2016 10:06 PM Full Code [id number]  Heide Spark Inpatient   03/03/2016  6:09 PM 03/09/2016  4:04 PM Full Code [id number]  Erin Fulling, MD ED   02/27/2016 10:41 PM 02/29/2016  7:53 PM Full Code [id number]  Oralia Manis, MD Inpatient   07/22/2015  7:30 PM 07/26/2015 12:16 AM Full Code [id number]  Adrian Saran, MD Inpatient   02/11/2015 10:28 AM 02/11/2015  6:15 PM Full Code [id number]  Nada Libman, MD Inpatient   08/04/2014  8:13 PM 08/13/2014  2:53 PM Full Code [id number]  Lynden Oxford, MD Inpatient    Advance Directive Documentation     Most Recent Value  Type of Advance Directive  Living will  Pre-existing out of facility DNR order (yellow form or pink MOST form)  -  "MOST" Form in Place?  -     Hosp day:@LENGTHOFSTAYDAYS @ Referring MD: @      URK:YHCWCBJ much better, fully awake. Good insight. No respiratory distress.   PMHX:   Past Medical History:  Diagnosis Date  . Antalgic gait   . Carotid artery occlusion   . Chronic neck and back pain   . Complication of anesthesia    pt has had cervical fusion-limited neck flextion  . COPD (chronic obstructive pulmonary disease) (HCC)   . Depression   . DJD (degenerative joint disease)    Right shoulder  . Hypertension   . Lumbago   . Peripheral neuropathy [facility])    Surgical Hx:  Past Surgical History:   Procedure Laterality Date  . ABDOMINAL AORTIC ANEURYSM REPAIR  2009   Endovascular AAA repair  . ABDOMINAL AORTIC ANEURYSM REPAIR    . CAROTID ANGIOGRAPHY  09/09/2016   Procedure: Carotid Angiography;  Surgeon: Nada Libman, MD;  Location: [facility]VAS[facility] CV LAB;  Service: Cardiovascular;;  rt. carotid  . CARPAL TUNNEL RELEASE     bilateral CTS  . HAMMER TOE SURGERY  12/10/2011   Procedure: HAMMER TOE CORRECTION;  Surgeon: Ernestene Kiel, DPM;  Location: [facility];  Service: Podiatry;  Laterality: Right;  Right hammertoe repair second right with 2.5x42 orthopro screw   . IR GENERIC HISTORICAL  04/05/2016   IR GASTROSTOMY TUBE MOD SED 04/05/2016 [facility]INTER[facility]  . IR GENERIC HISTORICAL  05/12/2016   IR CM INJ ANY COLONIC TUBE W/FLUORO 05/12/2016 Malachy Moan, MD [facility]INTERV [facility]  . IR GENERIC HISTORICAL  05/26/2016   IR CM INJ ANY COLONIC TUBE W/FLUORO 05/26/2016 [facility]INTER[facility]  . IR GENERIC HISTORICAL  05/28/2016   IR REPLC GASTRO/COLONIC TUBE PERCUT W/FLUORO 05/28/2016 Brayton El, PA-C [facility]-INTERV RAD  . IR GENERIC HISTORICAL  07/02/2016   IR GASTROSTOMY TUBE REMOVAL  07/02/2016 Brayton El, PA-C [facility]  . METATARSAL OSTEOTOMY  12/10/2011   Procedure: METATARSAL OSTEOTOMY;  Surgeon: Ernestene Kiel, DPM;  Location: [facility];  Service: Podiatry;  Laterality: Right;  Right Keller arthroplasty with size 5 silicone implant right great toe; second metatarsal osteotomy with 2.0x14 screw  . NECK SURGERY    . PERIPHERAL VASCULAR CATHETERIZATION N/A 02/11/2015   Procedure: Carotid Angiography;  Surgeon: Nada Libman, MD;  Location: [facility] INVASIVE CV LAB;  Service: Cardiovascular;  Laterality: N/A;  . PILONIDAL CYST / SINUS EXCISION    . Rotary cuff    . SPINE SURGERY  09/15/11   Scar tissue removed- back  . SPINE SURGERY  09/15/11   Scar tissue removed- back  . TONSILLECTOMY    . TRACHEOSTOMY TUBE PLACEMENT N/A 03/03/2016   Procedure: TRACHEOSTOMY;  Surgeon: Geanie Logan,  MD;  Location: [facility] ORS;  Service: ENT;  Laterality: N/A;   Family Hx:  Family History  Problem Relation Age of Onset  . Stomach cancer Mother   . Hypertension Brother   . Heart attack Father    Social Hx:   Social History  Substance Use Topics  . Smoking status: Former Smoker    Quit date: 08/30/2006  . Smokeless tobacco: Never Used  . Alcohol use 1.2 oz/week    2 Cans of beer per week     Comment: occasional use   Medication:    Home Medication:  Current Outpatient Rx  . [START ON 09/29/2016] Order #: [id number] Class: Print    Current Medication: @   Allergies:  Ampicillin; Nortriptyline; Brimonidine; Cyclobenzaprine; Tizanidine; and Gabapentin  Review of Systems: Gen:  Denies  fever, sweats, chills HEENT: Denies blurred vision, double vision, ear pain, eye pain, hearing loss, nose bleeds, sore throat Cvc:  No dizziness, chest pain or heaviness Resp: pretty much back to baseline, comfortable, mild wheezing  Gi: Denies swallowing difficulty, stomach pain, nausea or vomiting, diarrhea, constipation, bowel incontinence Gu:  Denies bladder incontinence, burning urine Ext:   No Joint pain, stiffness or swelling Skin: No skin rash, easy bruising or bleeding or hives Endoc:  No polyuria, polydipsia , polyphagia or weight change Psych: No depression, insomnia or hallucinations  Other:  All other systems negative  Physical Examination:   VS: BP 113/62   Pulse (!) 101   Temp (!) 96 F (35.6 C) (Tympanic)   Resp 15   Ht  (1.727 m)   Wt 208 lb (94.3 kg)   SpO2 99%   BMI 31.63 kg/m   General Appearance: No distress  Neuro: without focal findings, mental status, speech normal, alert and oriented, cranial nerves 2-12 intact, reflexes normal and symmetric, sensation grossly normal  NECK: Supple  HEENT: PERRLA, EOM intact, no ptosis, no other lesions noticed: Pulmonary:.No wheezing, No rales  Sputum Production:   Cardiovascular:  Normal S1,S2.  No m/r/g. l.     Abdomen:Benign, Soft, non-tender, No masses, hepatosplenomegaly, No lymphadenopathy Endoc: No evident thyromegaly, no signs of acromegaly or Cushing features Skin:   warm, no rashes, no ecchymosis  Extremities: normal, no cyanosis, clubbing, no edema, warm with normal capillary refill. Other findings:   Labs results:   Recent Labs     09/26/16  0404  09/26/16  1420  09/27/16  0431  09/28/16  0834  HGB  11.7*  11.9*  12.3*  10.7*  HCT  36.7*   --   37.5*   --   MCV  91.9   --  94.4   --   WBC  12.0*   --   8.4   --   BUN  42*   --   31*  38*  CREATININE  2.09*   --   1.35*  1.27*  GLUCOSE  119*   --   101*  110*  CALCIUM  8.6*   --   8.4*  8.4*  ,     Assessment and Plan: Hypercapnia respiratory failure/copd. Much improved.  Elevated right hemidiaphragm. ? Paralyzed, if so would also explain some of his dyspnea and hypercapnia. ? underlying sleep apnea -agree with the present copd regimen, ok to go home today -sniff test as an out patient -home and portable oxygen -see in the office in one week  Mental status changes,resolved.  Avoid higher sats to avoid co2 retention Following neurology recs    I have personally obtained a history, examined the patient, evaluated laboratory and imaging results, formulated the assessment and plan and placed orders.  The Patient requires high complexity decision making for assessment and support, frequent evaluation and titration of therapies, application of advanced monitoring technologies and extensive interpretation of multiple databases.   Love Chowning,M.D. Pulmonary & Critical care Medicine [facility]

## 2016-09-28 NOTE — Discharge Instructions (Signed)
Heart healthy diet. Continue home O2 [place]. Fall precaution. HHPT.

## 2016-09-28 NOTE — Evaluation (Signed)
Physical Therapy Evaluation Patient Details Name: Chad Huffman MRN: [id number] DOB: 08-04-1945 Today's Date: 09/28/2016   History of Present Illness  Pt admitted for acute on chronic respiratory failure with hypercapnia. Pt with complaints of emesis and fall from passing out. Pt reports multiple falls from syncopal episodes. 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 Pt is currently NPO for EGD this date. Of note, prior neck fusion Sx from C2-T2 with complaints of pain. Hospital stay complicated by rapid response on 4/8 secondary to lethargy.  Clinical Impression  Pt is a pleasant 71 year old male who was admitted for acute on chronic respiratory failure with hypercapnia. Pt performs bed mobility, transfers, and ambulation with cga using RW. All mobility performed on 4L of O2 with sats from 90-93%. No SOB symptoms noted with exertion. Pt reports he has not been able to walk for the last week, has been utilizing WC. Able to use RW and ambulate around room this date. Pt demonstrates deficits with strength/balance/mobility. Appears to be improving to baseline status. Would benefit from skilled PT to address above deficits and promote optimal return to PLOF. Recommend transition to HHPT upon discharge from acute hospitalization.       Follow Up Recommendations Home health PT (with supervision for mobility)    Equipment Recommendations  None recommended by PT;Other (comment)    Recommendations for Other Services       Precautions / Restrictions Precautions Precautions: Fall Restrictions Weight Bearing Restrictions: No      Mobility  Bed Mobility Overal bed mobility: Needs Assistance Bed Mobility: Supine to Sit     Supine to sit: Min guard     General bed mobility comments: needed assist for sliding B LE off bed. Used railings  Transfers Overall transfer level: Needs assistance Equipment used: Rolling walker (2  wheeled) Transfers: Sit to/from Stand Sit to Stand: Min guard         General transfer comment: Safe technique with slight assist for momentum. Once standing, upright posture. Transfers performed a few reps with pt improving to supervision for effective technique  Ambulation/Gait Ambulation/Gait assistance: Min guard Ambulation Distance (Feet): 40 Feet Assistive device: Rolling walker (2 wheeled) Gait Pattern/deviations: Step-through pattern;Decreased step length - right;Decreased step length - left;Decreased stride length     General Gait Details: generally unsteady, however no formal LOB noted. RW used. Cues given for obstacle avoidance  Stairs            Wheelchair Mobility    Modified Rankin (Stroke Patients Only)       Balance Overall balance assessment: History of Falls;Needs assistance Sitting-balance support: Feet supported;No upper extremity supported Sitting balance-Leahy Scale: Good     Standing balance support: Bilateral upper extremity supported Standing balance-Leahy Scale: Fair                               Pertinent Vitals/Pain Pain Assessment: Faces Faces Pain Scale: Hurts a little bit Pain Location: head and neck, complains of muscle spasms Pain Descriptors / Indicators: Sore;Spasm Pain Intervention(s): Limited activity within patient's tolerance;Patient requesting pain meds-RN notified    Home Living Family/patient expects to be discharged to:: Private residence Living Arrangements: Alone Available Help at Discharge: Family;Friend(s);Home health Type of Home: House Home Access: Stairs to enter Entrance Stairs-Rails: Right;Left;Can reach both Entrance Stairs-Number of Steps: 4 Home Layout: One level Home Equipment: Walker - 2  wheels;Wheelchair - manual      Prior Function Level of Independence: Needs assistance   Gait / Transfers Assistance Needed: Pt reported that he ambulates with RW at all times and has had multiple  falls (at least 10-11 in the past 6 months) secondary to syncopal episodes, however has needed the w/c for getting around the house in the past week. Pt reports that all of his falls occurred due to syncope, states "just passed out"  ADL's / Homemaking Assistance Needed: pt reports that friend of aide (unclear which) assists with bathing, dressing every other Chad as well as helps with meals and cleaning; pt reports RN with home health will assist with med mgt once he's back home        Hand Dominance        Extremity/Trunk Assessment   Upper Extremity Assessment Upper Extremity Assessment: Defer to OT evaluation LUE Deficits / Details: grossly 4-/5     Lower Extremity Assessment Lower Extremity Assessment: Generalized weakness (B LE grossly 4/5)       Communication   Communication: No difficulties  Cognition Arousal/Alertness: Awake/alert Behavior During Therapy: WFL for tasks assessed/performed Overall Cognitive Status: Within Functional Limits for tasks assessed                                        General Comments      Exercises     Assessment/Plan    PT Assessment Patient needs continued PT services  PT Problem List Decreased strength;Decreased activity tolerance;Decreased balance;Decreased mobility;Decreased coordination;Decreased knowledge of use of DME;Decreased safety awareness       PT Treatment Interventions DME instruction;Stair training;Gait training;Functional mobility training;Therapeutic exercise;Therapeutic activities;Balance training;Neuromuscular re-education;Patient/family education    PT Goals (Current goals can be found in the Care Plan section)  Acute Rehab PT Goals Patient Stated Goal: get better PT Goal Formulation: With patient Time For Goal Achievement: 09/28/16 Potential to Achieve Goals: Good    Frequency Min 2X/week   Barriers to discharge Decreased caregiver support      Co-evaluation               End  of Session Equipment Utilized During Treatment: Gait belt;Oxygen Activity Tolerance: Patient tolerated treatment well Patient left: in chair;with chair alarm set;with call bell/phone within reach Nurse Communication: Mobility status;Patient requests pain meds PT Visit Diagnosis: Muscle weakness (generalized) (M62.81);Unsteadiness on feet (R26.81);Repeated falls (R29.6)    Time: 1610-9604 PT Time Calculation (min) (ACUTE ONLY): 20 min   Charges:   PT Evaluation $PT Eval Moderate Complexity: 1 Procedure     PT G Codes:        Elizabeth Palau, PT, DPT [phone]   Abdulrahim Siddiqi 09/28/2016, 10:25 AM

## 2016-09-28 NOTE — Consult Note (Signed)
[facility] Face-to-Face Psychiatry Consult   Reason for Consult:  Consult for 71 year old man with multiple medical problems. Yesterday was acutely delirious Referring Physician:  Bridgett Larsson Patient Identification: Chad Huffman MRN:  [id number] Principal Diagnosis: Acute delirium Diagnosis:   Patient Active Problem List   Diagnosis Date Noted  . Acute delirium [R41.0] 09/27/2016  . Hematemesis without nausea [K92.0]   . Pressure injury of skin [L89.90] 09/14/2016  . Weakness [R53.1] 09/13/2016  . UTI (urinary tract infection) [N39.0] 09/13/2016  . Chronic diastolic CHF (congestive heart failure) ([place]) [I50.32] 09/13/2016  . COPD (chronic obstructive pulmonary disease) ([place]) [J44.9] 09/13/2016  . Carotid stenosis [I65.29] 09/09/2016  . Carotid artery stenosis [I65.29] 08/23/2016  . Pancytopenia ([place]) [W09.811] 08/23/2016  . Hyperkalemia [E87.5] 08/23/2016  . Acute on chronic diastolic CHF (congestive heart failure) ([place]) [I50.33] 07/27/2016  . Leukocytosis [D72.829] 07/27/2016  . Thrombocytopenia ([place]) [D69.6] 07/27/2016  . Anemia [D64.9] 07/27/2016  . Acute on chronic respiratory failure with hypoxia and hypercapnia (HCC) [B14.78, J96.22] 07/16/2016  . Status post insertion of percutaneous endoscopic gastrostomy (PEG) tube ([place]) [Z93.1] 05/30/2016  . Dysphagia, pharyngoesophageal phase [R13.14] 05/18/2016  . GERD without esophagitis [K21.9] 05/09/2016  . Bilateral lower extremity edema [R60.0] 05/09/2016  . Slow transit constipation [K59.01] 05/09/2016  . Depression with anxiety [F41.8] 05/09/2016  . Respiratory failure with hypoxia and hypercapnia ([place]) [J96.91, J96.92] 07/22/2015  . Cervical vertebral fusion [M43.22]   . Depression [F32.9]   . Chronic neck and back pain [M54.2, M54.9]   . Acute on chronic respiratory failure with hypercapnia (HCC) [J96.22]   . COPD exacerbation ([place]) [J44.1]   . HCAP (healthcare-associated pneumonia) [J18.9]   . Congestive dilated cardiomyopathy ([place])  [I42.0]   . Syncope and collapse [R55]   . Arthritis of right hip [M16.11] 08/04/2014  . Essential hypertension [I10] 08/04/2014  . Hypothyroidism [E03.9] 08/04/2014  . Syncope [R55] 08/04/2014  . Fall [W19.XXXA] 08/04/2014    Total Time spent with patient: 15 minutes  Subjective:   Chad Huffman is a 71 y.o. male patient admitted with "I was incoherent".  Follow-up for Tuesday the 10th. No new complaints. Patient is awake alert and oriented and appears to be in his normal mental state. Affect euthymic. No sign of delirium. He says that he slept well last night.  HPI:  Patient interviewed chart reviewed. 71 year old man who is in the hospital after a fall also with COPD and blood tinged vomiting low oxygen. Yesterday evening the patient became very confused and was described as yelling thrashing around on redirectable. On interview today I found the patient calm alert oriented and fairly clearheaded. He was able to tell me that he was in the hospital for bleeding from the stomach and a fall and dizziness at home. Patient says he's had a lot of falls recently. At least one time he knows he is hit his head. Psychiatrically he denies any mood symptoms. Says his mood is been good. Chronic sleep which is not so good at night. Appetite adequate. Totally denies any suicidal thoughts. Denies having had any hallucinations at home. On interview today the patient is alert and oriented to his situation and to the hospital and to the date. As having any hallucinations or confusion today but has an awareness that when he gets sick he can be incoherent.  Social history: Patient lives by himself although it sounds like he has a lot of family and friends who help him out in various ways. He is a retired Development worker, community carrier.  Medical history: Had neck surgery several years ago after which he has developed multiple other medical problems. He is oxygen dependent because of his COPD at home. He has had chronic problems with  dizziness and falls and COPD. Hypertension. No documented history of a stroke. 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  Substance abuse history: Says that he only drinks about 2 beers a month. Denies use of any other drugs. Says he did drink more than the past but didn't think it was ever a problem.  Past Psychiatric History: I saw this patient once previously in a consult several years ago because of confusion which appeared to be mostly medication related. There is no history of psychiatric hospitalization depression or suicidal behavior.  Risk to Self: Is patient at risk for suicide?: No Risk to Others:   Prior Inpatient Therapy:   Prior Outpatient Therapy:    Past Medical History:  Past Medical History:  Diagnosis Date  . Antalgic gait   . Carotid artery occlusion   . Chronic neck and back pain   . Complication of anesthesia    pt has had cervical fusion-limited neck flextion  . COPD (chronic obstructive pulmonary disease) ([place])   . Depression   . DJD (degenerative joint disease)    Right shoulder  . Hypertension   . Lumbago   . Peripheral neuropathy [facility])     Past Surgical History:  Procedure Laterality Date  . ABDOMINAL AORTIC ANEURYSM REPAIR  2009   Endovascular AAA repair  . ABDOMINAL AORTIC ANEURYSM REPAIR    . CAROTID ANGIOGRAPHY  09/09/2016   Procedure: Carotid Angiography;  Surgeon: Serafina Mitchell, MD;  Location: [place] [facility];  Service: Cardiovascular;;  rt. carotid  . CARPAL TUNNEL RELEASE     bilateral CTS  . HAMMER TOE SURGERY  12/10/2011   Procedure: HAMMER TOE CORRECTION;  Surgeon: Tyson Dense, DPM;  Location: [place];  Service: Podiatry;  Laterality: Right;  Right hammertoe repair second right with 2.5x42 orthopro screw   . IR GENERIC HISTORICAL  04/05/2016   IR GASTROSTOMY TUBE MOD SED 04/05/2016 [facility]  . IR  GENERIC HISTORICAL  05/12/2016   IR CM INJ ANY COLONIC TUBE W/FLUORO 05/12/2016 Jacqulynn Cadet, MD [facility]  . IR GENERIC HISTORICAL  05/26/2016   IR CM INJ ANY COLONIC TUBE W/FLUORO 05/26/2016 [facility]  . IR GENERIC HISTORICAL  05/28/2016   IR [place] GASTRO/COLONIC TUBE PERCUT W/FLUORO 05/28/2016 Ascencion Dike, PA-C [facility]  . IR GENERIC HISTORICAL  07/02/2016   IR GASTROSTOMY TUBE REMOVAL 07/02/2016 Ascencion Dike, PA-C [facility]  . METATARSAL OSTEOTOMY  12/10/2011   Procedure: METATARSAL OSTEOTOMY;  Surgeon: Tyson Dense, DPM;  Location: [place];  Service: Podiatry;  Laterality: Right;  Right Keller arthroplasty with size 5 silicone implant right great toe; second metatarsal osteotomy with 2.0x14 screw  . NECK SURGERY    . PERIPHERAL VASCULAR CATHETERIZATION N/A 02/11/2015   Procedure: Carotid Angiography;  Surgeon: Serafina Mitchell, MD;  Location: [place] [facility];  Service: Cardiovascular;  Laterality: N/A;  . PILONIDAL CYST / SINUS EXCISION    . Rotary cuff    . SPINE SURGERY  09/15/11   Scar tissue removed- back  . SPINE SURGERY  09/15/11   Scar tissue removed- back  . TONSILLECTOMY    . TRACHEOSTOMY TUBE PLACEMENT N/A  03/03/2016   Procedure: TRACHEOSTOMY;  Surgeon: Clyde Canterbury, MD;  Location: [facility];  Service: ENT;  Laterality: N/A;   Family History:  Family History  Problem Relation Age of Onset  . Stomach cancer Mother   . Hypertension Brother   . Heart attack Father    Family Psychiatric  History: No known family history Social History:  History  Alcohol Use  . 1.2 oz/week  . 2 Cans of beer per week    Comment: occasional use     History  Drug Use No    Social History   Social History  . Marital status: Single    Spouse name: N/A  . Number of children: N/A  . Years of education: N/A   Social History Main Topics  . Smoking status: Former Smoker    Quit date: 08/30/2006  . Smokeless tobacco: Never Used  . Alcohol use 1.2 oz/week     2 Cans of beer per week     Comment: occasional use  . Drug use: No  . Sexual activity: Not Asked   Other Topics Concern  . None   Social History Narrative  . None   Additional Social History:    Allergies:   Allergies  Allergen Reactions  . Ampicillin Swelling and Other (See Comments)    Unable to obtain enough information to answer additional questions about this medication.  Has patient had a PCN reaction causing immediate rash, facial/tongue/throat swelling, SOB or lightheadedness with hypotension: Yes Has patient had a PCN reaction causing severe rash involving mucus membranes or skin necrosis: No Has patient had a PCN reaction that required hospitalization No Has patient had a PCN reaction occurring within the last 10 years: Yes If all of the above answers are "NO", then may pr  . Nortriptyline Other (See Comments)    Sleep walking  . Brimonidine Other (See Comments)    Unknown reaction per MAR   . Cyclobenzaprine Other (See Comments)    Hallucinations  . Tizanidine Other (See Comments)    Hallucinations  . Gabapentin Rash    Labs:  Results for orders placed or performed during the hospital encounter of 09/26/16 (from the past 48 hour(s))  Basic metabolic panel     Status: Abnormal   Collection Time: 09/27/16  4:31 AM  Result Value Ref Range   Sodium 138 135 - 145 mmol/L   Potassium 4.5 3.5 - 5.1 mmol/L   Chloride 98 (L) 101 - 111 mmol/L   CO2 33 (H) 22 - 32 mmol/L   Glucose, Bld 101 (H) 65 - 99 mg/dL   BUN 31 (H) 6 - 20 mg/dL   Creatinine, Ser 1.35 (H) 0.61 - 1.24 mg/dL   Calcium 8.4 (L) 8.9 - 10.3 mg/dL   GFR calc non Af Amer 52 (L) >60 mL/min   GFR calc Af Amer >60 >60 mL/min    Comment: (NOTE) The eGFR has been calculated using the CKD EPI equation. This calculation has not been validated in all clinical situations. eGFR's persistently <60 mL/min signify possible Chronic Kidney Disease.    Anion gap 7 5 - 15  CBC     Status: Abnormal   Collection  Time: 09/27/16  4:31 AM  Result Value Ref Range   WBC 8.4 3.8 - 10.6 K/uL   RBC 3.97 (L) 4.40 - 5.90 MIL/uL   Hemoglobin 12.3 (L) 13.0 - 18.0 g/dL   HCT 37.5 (L) 40.0 - 52.0 %   MCV 94.4 80.0 - 100.0 fL  MCH 31.0 26.0 - 34.0 pg   MCHC 32.9 32.0 - 36.0 g/dL   RDW 17.7 (H) 11.5 - 14.5 %   Platelets 158 150 - 440 K/uL  Magnesium     Status: Abnormal   Collection Time: 09/27/16  4:31 AM  Result Value Ref Range   Magnesium 2.9 (H) 1.7 - 2.4 mg/dL  Basic metabolic panel     Status: Abnormal   Collection Time: 09/28/16  8:34 AM  Result Value Ref Range   Sodium 137 135 - 145 mmol/L   Potassium 4.8 3.5 - 5.1 mmol/L   Chloride 98 (L) 101 - 111 mmol/L   CO2 33 (H) 22 - 32 mmol/L   Glucose, Bld 110 (H) 65 - 99 mg/dL   BUN 38 (H) 6 - 20 mg/dL   Creatinine, Ser 1.27 (H) 0.61 - 1.24 mg/dL   Calcium 8.4 (L) 8.9 - 10.3 mg/dL   GFR calc non Af Amer 56 (L) >60 mL/min   GFR calc Af Amer >60 >60 mL/min    Comment: (NOTE) The eGFR has been calculated using the CKD EPI equation. This calculation has not been validated in all clinical situations. eGFR's persistently <60 mL/min signify possible Chronic Kidney Disease.    Anion gap 6 5 - 15  Hemoglobin     Status: Abnormal   Collection Time: 09/28/16  8:34 AM  Result Value Ref Range   Hemoglobin 10.7 (L) 13.0 - 18.0 g/dL    Current Facility-Administered Medications  Medication Dose Route Frequency Provider Last Rate Last Dose  . 0.9 %  sodium chloride infusion   Intravenous Continuous Demetrios Loll, MD 100 mL/hr at 09/28/16 1047 1,000 mL at 09/28/16 1047  . acetaminophen (TYLENOL) tablet 650 mg  650 mg Oral Q6H PRN Demetrios Loll, MD   650 mg at 09/28/16 1407   Or  . acetaminophen (TYLENOL) suppository 650 mg  650 mg Rectal Q6H PRN Demetrios Loll, MD      . albuterol (PROVENTIL) (2.5 MG/3ML) 0.083% nebulizer solution 2.5 mg  2.5 mg Nebulization Q2H PRN Demetrios Loll, MD      . bisacodyl (DULCOLAX) EC tablet 5 mg  5 mg Oral Daily PRN Demetrios Loll, MD      .  budesonide (PULMICORT) nebulizer solution 0.5 mg  0.5 mg Nebulization BID Demetrios Loll, MD   0.5 mg at 09/28/16 0752  . folic acid (FOLVITE) tablet 1 mg  1 mg Oral Daily Demetrios Loll, MD   1 mg at 09/28/16 1407  . haloperidol lactate (HALDOL) injection 1 mg  1 mg Intravenous Q6H PRN Gonzella Lex, MD      . HYDROcodone-acetaminophen (NORCO/VICODIN) 5-325 MG per tablet 1-2 tablet  1-2 tablet Oral Q4H PRN Demetrios Loll, MD      . ketorolac (TORADOL) 15 MG/ML injection 15 mg  15 mg Intravenous Q6H PRN Demetrios Loll, MD   15 mg at 09/27/16 2137  . levothyroxine (SYNTHROID, LEVOTHROID) tablet 75 mcg  75 mcg Oral Q0600 Demetrios Loll, MD   75 mcg at 09/28/16 0434  . ondansetron (ZOFRAN) tablet 4 mg  4 mg Oral Q6H PRN Demetrios Loll, MD       Or  . ondansetron [facility]) injection 4 mg  4 mg Intravenous Q6H PRN Demetrios Loll, MD   4 mg at 09/27/16 2137  . oxyCODONE (Oxy IR/ROXICODONE) immediate release tablet 5 mg  5 mg Oral Q8H PRN Demetrios Loll, MD   5 mg at 09/27/16 2020  . pantoprazole (PROTONIX) injection 40 mg  40 mg Intravenous Q12H Demetrios Loll, MD   40 mg at 09/27/16 2137  . [START ON 09/29/2016] predniSONE (DELTASONE) tablet 50 mg  50 mg Oral Q breakfast Demetrios Loll, MD      . QUEtiapine (SEROQUEL) tablet 50 mg  50 mg Oral QHS Demetrios Loll, MD   50 mg at 09/27/16 2256  . senna-docusate (Senokot-S) tablet 1 tablet  1 tablet Oral QHS PRN Demetrios Loll, MD      . sertraline (ZOLOFT) tablet 75 mg  75 mg Oral Daily Demetrios Loll, MD   75 mg at 09/28/16 1406  . sodium chloride flush (NS) 0.9 % injection 3 mL  3 mL Intravenous Q12H Demetrios Loll, MD   3 mL at 09/27/16 2141  . tiotropium (SPIRIVA) inhalation capsule 18 mcg  18 mcg Inhalation Daily Demetrios Loll, MD   18 mcg at 09/27/16 0906    Musculoskeletal: Strength & Muscle Tone: decreased Gait & Station: unsteady Patient leans: Backward  Psychiatric Specialty Exam: Physical Exam  Nursing note and vitals reviewed. Constitutional: He appears well-developed and well-nourished.  HENT:  Head:  Normocephalic and atraumatic.  Eyes: Conjunctivae are normal. Pupils are equal, round, and reactive to light.  Neck: Normal range of motion.  Cardiovascular: Normal heart sounds.   Respiratory: He is in respiratory distress.  GI: Soft.  Musculoskeletal: Normal range of motion.  Neurological: He is alert.  Skin: Skin is warm and dry.  Psychiatric: He has a normal mood and affect. His speech is normal and behavior is normal. Judgment and thought content normal. He exhibits abnormal recent memory.    Review of Systems  Constitutional: Negative.   HENT: Negative.   Eyes: Negative.   Respiratory: Negative.   Cardiovascular: Negative.   Gastrointestinal: Negative.   Musculoskeletal: Negative.   Skin: Negative.   Neurological: Negative.   Psychiatric/Behavioral: Negative.     Blood pressure 113/62, pulse (!) 101, temperature (!) 96 F (35.6 C), temperature source Tympanic, resp. rate 15, height 5' 8"  (1.727 m), weight 94.3 kg (208 lb), SpO2 99 %.Body mass index is 31.63 kg/m.  General Appearance: Disheveled  Eye Contact:  Fair  Speech:  Slow  Volume:  Decreased  Mood:  Euthymic  Affect:  Congruent  Thought Process:  Goal Directed  Orientation:  Full (Time, Place, and Person)  Thought Content:  Logical  Suicidal Thoughts:  No  Homicidal Thoughts:  No  Memory:  Immediate;   Good Recent;   Fair Remote;   Fair  Judgement:  Fair  Insight:  Fair  Psychomotor Activity:  Decreased  Concentration:  Concentration: Fair  Recall:  [employer] of Knowledge:  Fair  Language:  Fair  Akathisia:  No  Handed:  Right  AIMS (if indicated):     Assets:  Communication Skills Desire for Improvement Financial Resources/Insurance Housing Resilience Social Support  ADL's:  Impaired  Cognition:  Impaired,  Mild  Sleep:        Treatment Plan Summary: Daily contact with patient to assess and evaluate symptoms and progress in treatment, Medication management and Plan Patient appears to have  recovered from delirium and does not need any further psychiatric evaluation and treatment. No need for new psychiatric prescriptions at discharge.  Disposition: Patient does not meet criteria for psychiatric inpatient admission. Supportive therapy provided about ongoing stressors.  Alethia Berthold, MD 09/28/2016 5:46 PM

## 2016-09-28 NOTE — Anesthesia Postprocedure Evaluation (Signed)
Anesthesia Post Note  Patient: Chad Huffman  Procedure(s) Performed: Procedure(s) (LRB): ESOPHAGOGASTRODUODENOSCOPY (EGD) WITH PROPOFOL (N/A)  Patient location during evaluation: Endoscopy Anesthesia Type: General Level of consciousness: awake and alert Pain management: pain level controlled Vital Signs Assessment: post-procedure vital signs reviewed and stable Respiratory status: spontaneous breathing, nonlabored ventilation, respiratory function stable and patient connected to nasal cannula oxygen Cardiovascular status: blood pressure returned to baseline and stable Postop Assessment: no signs of nausea or vomiting Anesthetic complications: no     Last Vitals:  Vitals:   09/28/16 1126 09/28/16 1136  BP: 114/62 113/62  Pulse: (!) 103 (!) 101  Resp: 15 15  Temp:      Last Pain:  Vitals:   09/28/16 1106  TempSrc: Tympanic  PainSc:                  Cleda Mccreedy Latika Kronick

## 2016-09-28 NOTE — Discharge Summary (Signed)
[employer] [facility] [place] at [facility]   PATIENT NAME: Chad Huffman    MR#:  [id number]  DATE OF BIRTH:  1946-03-14  DATE OF ADMISSION:  09/26/2016   ADMITTING PHYSICIAN: Shaune Pollack, MD  DATE OF DISCHARGE: 09/28/2016 PRIMARY CARE PHYSICIAN: Dorothey Baseman, MD   ADMISSION DIAGNOSIS:  Acute respiratory failure with hypercapnia (HCC) [J96.02] Acute encephalopathy [G93.40] AKI (acute kidney injury) (HCC) [N17.9] DISCHARGE DIAGNOSIS:  Principal Problem:   Acute delirium Active Problems:   Acute on chronic respiratory failure with hypercapnia (HCC)   Hematemesis without nausea Acute on chronic respiratory failure with hypercapnia due to COPD exacerbation Acute renal failure, ATN. Hypotension SECONDARY DIAGNOSIS:   Past Medical History:  Diagnosis Date  . Antalgic gait   . Carotid artery occlusion   . Chronic neck and back pain   . Complication of anesthesia    pt has had cervical fusion-limited neck flextion  . COPD (chronic obstructive pulmonary disease) (HCC)   . Depression   . DJD (degenerative joint disease)    Right shoulder  . Hypertension   . Lumbago   . Peripheral neuropathy [facility])    HOSPITAL COURSE:  Acute on chronic respiratory failure with hypercapnia due to COPD exacerbation. Tapered IV Solu-Medrol, albuterol, changed to prednisone taper, continue Pulmicort and Spiriva. Continue oxygen by nasal cannular. Follow-up with Dr. Meredeth Ide as outpatient.  Acute renal failure, ATN. Improved with normal saline IV, hold lisinopril, torsemide and ibuprofen. Follow-up PCP for resuming.  Hypotension. Hold hypertension medication. Improved.  Acute Encephalopathy due to above. Improved. Aspiration and fall precaution. MRI of the brain is unremarkable. con't his home psychiatric medications as creatinine has improved per neurology consult.  Mild leukocytosis. Possible due to reaction. Improved.  Questionable GI bleeding. Hemoglobin is stable.  hold  aspirin and Plavix, Protonix IV twice a day. EKG is normal per Dr. Servando Snare. Resumed diet, resume aspirin and Plavix. Discontinue Protonix.  Physical therapist suggests home health and PT. DISCHARGE CONDITIONS:  Stable, discharge to home with home health and PT today. CONSULTS OBTAINED:  Treatment Team:  Mertie Moores, MD Audery Amel, MD Pauletta Browns, MD DRUG ALLERGIES:   Allergies  Allergen Reactions  . Ampicillin Swelling and Other (See Comments)    Unable to obtain enough information to answer additional questions about this medication.  Has patient had a PCN reaction causing immediate rash, facial/tongue/throat swelling, SOB or lightheadedness with hypotension: Yes Has patient had a PCN reaction causing severe rash involving mucus membranes or skin necrosis: No Has patient had a PCN reaction that required hospitalization No Has patient had a PCN reaction occurring within the last 10 years: Yes If all of the above answers are "NO", then may pr  . Nortriptyline Other (See Comments)    Sleep walking  . Brimonidine Other (See Comments)    Unknown reaction per MAR   . Cyclobenzaprine Other (See Comments)    Hallucinations  . Tizanidine Other (See Comments)    Hallucinations  . Gabapentin Rash   DISCHARGE MEDICATIONS:   Allergies as of 09/28/2016      Reactions   Ampicillin Swelling, Other (See Comments)   Unable to obtain enough information to answer additional questions about this medication.  Has patient had a PCN reaction causing immediate rash, facial/tongue/throat swelling, SOB or lightheadedness with hypotension: Yes Has patient had a PCN reaction causing severe rash involving mucus membranes or skin necrosis: No Has patient had a PCN reaction that required hospitalization No Has patient had a  PCN reaction occurring within the last 10 years: Yes If all of the above answers are "NO", then may pr   Nortriptyline Other (See Comments)   Sleep walking   Brimonidine  Other (See Comments)   Unknown reaction per [facility]    Cyclobenzaprine Other (See Comments)   Hallucinations   Tizanidine Other (See Comments)   Hallucinations   Gabapentin Rash      Medication List    STOP taking these medications   baclofen 20 MG tablet Commonly known as:  LIORESAL   lisinopril 10 MG tablet Commonly known as:  PRINIVIL,ZESTRIL   torsemide 20 MG tablet Commonly known as:  DEMADEX     TAKE these medications   albuterol 1.25 MG/3ML nebulizer solution Commonly known as:  ACCUNEB Take 1 ampule by nebulization every 4 (four) hours as needed for wheezing.   aspirin EC 81 MG tablet Take 81 mg by mouth daily.   budesonide 0.5 MG/2ML nebulizer solution Commonly known as:  PULMICORT Take 0.5 mg by nebulization 2 (two) times daily.   clonazePAM 0.5 MG tablet Commonly known as:  KLONOPIN Take 1 tablet (0.5 mg total) by mouth 2 (two) times daily.   clopidogrel 75 MG tablet Commonly known as:  PLAVIX Take 75 mg by mouth daily.   folic acid 1 MG tablet Commonly known as:  FOLVITE Take 1 mg by mouth daily.   levothyroxine 75 MCG tablet Commonly known as:  SYNTHROID, LEVOTHROID Take 1 tablet (75 mcg total) by mouth daily before breakfast.   metoCLOPramide 5 MG tablet Commonly known as:  REGLAN Take 2.5 mg by mouth every 6 (six) hours.   metoprolol tartrate 25 MG tablet Commonly known as:  LOPRESSOR Take 1 tablet (25 mg total) by mouth 2 (two) times daily.   multivitamin with minerals Tabs tablet Take 1 tablet by mouth daily.   oxyCODONE 5 MG immediate release tablet Commonly known as:  Oxy IR/ROXICODONE Take 1 tablet (5 mg total) by mouth every 8 (eight) hours as needed for moderate pain.   OXYGEN 4 L/min by Intratracheal route as needed (for shortness of breath).   predniSONE 10 MG tablet Commonly known as:  DELTASONE 40 mg po daily for 2 days. 20 mg po daily for 2 days.10 mg po daily for 3 days. Start taking on:  09/29/2016   QUEtiapine 50 MG  tablet Commonly known as:  SEROQUEL Take 50 mg by mouth at bedtime.   sertraline 50 MG tablet Commonly known as:  ZOLOFT Take 75 mg by mouth daily.   SYMBICORT 160-4.5 MCG/ACT inhaler Generic drug:  budesonide-formoterol Inhale 2 puffs into the lungs 2 (two) times daily.   tiotropium 18 MCG inhalation capsule Commonly known as:  SPIRIVA Place 1 capsule (18 mcg total) into inhaler and inhale daily.        DISCHARGE INSTRUCTIONS:  See AVS.  If you experience worsening of your admission symptoms, develop shortness of breath, life threatening emergency, suicidal or homicidal thoughts you must seek medical attention immediately by calling 911 or calling your MD immediately  if symptoms less severe.  You Must read complete instructions/literature along with all the possible adverse reactions/side effects for all the Medicines you take and that have been prescribed to you. Take any new Medicines after you have completely understood and accpet all the possible adverse reactions/side effects.   Please note  You were cared for by a hospitalist during your hospital stay. If you have any questions about your discharge medications or the care  you received while you were in the hospital after you are discharged, you can call the unit and asked to speak with the hospitalist on call if the hospitalist that took care of you is not available. Once you are discharged, your primary care physician will handle any further medical issues. Please note that NO REFILLS for any discharge medications will be authorized once you are discharged, as it is imperative that you return to your primary care physician (or establish a relationship with a primary care physician if you do not have one) for your aftercare needs so that they can reassess your need for medications and monitor your lab values.    On the day of Discharge:  VITAL SIGNS:  Blood pressure 113/62, pulse (!) 101, temperature (!) 96 F (35.6 C),  temperature source Tympanic, resp. rate 15, height  (1.727 m), weight 208 lb (94.3 kg), SpO2 99 %. PHYSICAL EXAMINATION:  GENERAL:  71 y.o.-year-old patient lying in the bed with no acute distress.  EYES: Pupils equal, round, reactive to light and accommodation. No scleral icterus. Extraocular muscles intact.  HEENT: Head atraumatic, normocephalic. Oropharynx and nasopharynx clear.  NECK:  Supple, no jugular venous distention. No thyroid enlargement, no tenderness.  LUNGS: Normal breath sounds bilaterally, no wheezing, rales,rhonchi or crepitation. No use of accessory muscles of respiration.  CARDIOVASCULAR: S1, S2 normal. No murmurs, rubs, or gallops.  ABDOMEN: Soft, non-tender, non-distended. Bowel sounds present. No organomegaly or mass.  EXTREMITIES: No pedal edema, cyanosis, or clubbing.  NEUROLOGIC: Cranial nerves II through XII are intact. Muscle strength 5/5 in all extremities. Sensation intact. Gait not checked.  PSYCHIATRIC: The patient is alert and oriented x 3.  SKIN: No obvious rash, lesion, or ulcer.  DATA REVIEW:   CBC  Recent Labs Lab 09/27/16 0431 09/28/16 0834  WBC 8.4  --   HGB 12.3* 10.7*  HCT 37.5*  --   PLT 158  --     Chemistries   Recent Labs Lab 09/26/16 0404  09/27/16 0431 09/28/16 0834  NA 134*  --  138 137  K 4.0  --  4.5 4.8  CL 94*  --  98* 98*  CO2 33*  --  33* 33*  GLUCOSE 119*  --  101* 110*  BUN 42*  --  31* 38*  CREATININE 2.09*  --  1.35* 1.27*  CALCIUM 8.6*  --  8.4* 8.4*  MG 3.0*  < > 2.9*  --   AST 22  --   --   --   ALT 17  --   --   --   ALKPHOS 44  --   --   --   BILITOT 1.3*  --   --   --   < > = values in this interval not displayed.   Microbiology Results  Results for orders placed or performed during the hospital encounter of 09/13/16  Urine culture     Status: Abnormal   Collection Time: 09/13/16  5:15 PM  Result Value Ref Range Status   Specimen Description URINE, RANDOM  Final   Special Requests NONE  Final     Culture >=100,000 COLONIES/mL SERRATIA MARCESCENS (A)  Final   Report Status 09/16/2016 FINAL  Final   Organism ID, Bacteria SERRATIA MARCESCENS (A)  Final      Susceptibility   Serratia marcescens - MIC*    CEFAZOLIN >=64 RESISTANT Resistant     CEFTRIAXONE <=1 SENSITIVE Sensitive     CIPROFLOXACIN <=0.25 SENSITIVE Sensitive  GENTAMICIN <=1 SENSITIVE Sensitive     NITROFURANTOIN 256 RESISTANT Resistant     TRIMETH/SULFA <=20 SENSITIVE Sensitive     * >=100,000 COLONIES/mL SERRATIA MARCESCENS    RADIOLOGY:  No results found.   Management plans discussed with the patient, family and they are in agreement.  CODE STATUS: Full Code   TOTAL TIME TAKING CARE OF THIS PATIENT: 35 minutes.    Shaune Pollack M.D on 09/28/2016 at 1:18 PM  Between 7am to 6pm - Pager - [phone]  After 6pm go to [email or web address] - Social research officer, government  [employer] [facility]  Office  [phone]  CC: Primary care physician; Dorothey Baseman, MD   Note: This dictation was prepared with Dragon dictation along with smaller phrase technology. Any transcriptional errors that result from this process are unintentional.

## 2016-09-28 NOTE — Progress Notes (Signed)
The patient had a normal EGD after an episode of hematemesis. I will sign off.  Please call if any further GI concerns or questions.  We would like to thank you for the opportunity to participate in the care of Chad Huffman.

## 2016-09-28 NOTE — Transfer of Care (Signed)
Immediate Anesthesia Transfer of Care Note  Patient: Chad Huffman  Procedure(s) Performed: Procedure(s): ESOPHAGOGASTRODUODENOSCOPY (EGD) WITH PROPOFOL (N/A)  Patient Location: PACU  Anesthesia Type:General  Level of Consciousness: sedated and responds to stimulation  Airway & Oxygen Therapy: Patient Spontanous Breathing and Patient connected to nasal cannula oxygen  Post-op Assessment: Report given to RN and Post -op Vital signs reviewed and stable  Post vital signs: Reviewed and stable  Last Vitals:  Vitals:   09/28/16 1106 09/28/16 1109  BP: (!) 92/51 (!) 92/51  Pulse: (!) 121 (!) 120  Resp: 18 11  Temp: (!) 35.6 C     Last Pain:  Vitals:   09/28/16 1106  TempSrc: Tympanic  PainSc:       Patients Stated Pain Goal: 2 (09/27/16 2020)  Complications: No apparent anesthesia complications

## 2016-09-28 NOTE — Progress Notes (Signed)
Occupational Therapy Treatment Patient Details Name: Chad Huffman MRN: [id number] DOB: [birth year] Today's Date: 09/28/2016    History of present illness Pt admitted for acute on chronic respiratory failure with hypercapnia. Pt with complaints of emesis and fall from passing out. Pt reports multiple falls from syncopal episodes. 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 Pt is currently NPO for EGD this date. Of note, prior neck fusion Sx from C2-T2 with complaints of pain. Hospital stay complicated by rapid response on 4/8 secondary to lethargy.   OT comments  Pt progressing towards goals. Pt educated in energy conservation strategies and home/routines modifications to maximize functional independence, safety, and minimize future falls risk and rehospitalization. Pt verbalized understanding of all information provided and handout left with pt. Upgraded discharge recommendations to HHOT.   Follow Up Recommendations  Home health OT    Equipment Recommendations  None recommended by OT    Recommendations for Other Services      Precautions / Restrictions Precautions Precautions: Fall;None Restrictions Weight Bearing Restrictions: No       Mobility Bed Mobility                  Transfers                      Balance                                           ADL either performed or assessed with clinical judgement   ADL                                               Vision       Perception     Praxis      Cognition                                                Exercises Other Exercises Other Exercises: pt educated in energy conservation strategies and home/routines modifications to maximize safety and falls prevention while maximizing functional independence; handout provided   Shoulder Instructions       General Comments      Pertinent  Vitals/ Pain          Home Living                                          Prior Functioning/Environment              Frequency  Min 1X/week        Progress Toward Goals  OT Goals(current goals can now be found in the care plan section)  Progress towards OT goals: Progressing toward goals  Acute Rehab OT Goals Patient Stated Goal: get better OT Goal Formulation: With patient Time For Goal Achievement: 10/11/16 Potential to Achieve Goals: Good  Plan Frequency remains appropriate;Discharge plan needs to be updated    Co-evaluation  End of Session    OT Visit Diagnosis: Other abnormalities of gait and mobility (R26.89);Repeated falls (R29.6);History of falling (Z91.81);Muscle weakness (generalized) (M62.81)   Activity Tolerance Patient tolerated treatment well   Patient Left in bed;with call bell/phone within reach;with bed alarm set   Nurse Communication          Time: 0981-1914 OT Time Calculation (min): 17 min  Charges: OT General Charges $OT Visit: 1 Procedure OT Treatments $Self Care/Home Management : 8-22 mins  Richrd Prime, MPH, MS, OTR/L ascom [phone] 09/28/16, 2:35 PM

## 2016-09-28 NOTE — Anesthesia Post-op Follow-up Note (Cosign Needed)
Anesthesia QCDR form completed.        

## 2016-09-28 NOTE — Anesthesia Preprocedure Evaluation (Signed)
Anesthesia Evaluation  Patient identified by MRN, date of birth, ID band Patient awake    Reviewed: Allergy & Precautions, H&P , NPO status , Patient's Chart, lab work & pertinent test results  History of Anesthesia Complications (+) history of anesthetic complications  Airway Mallampati: III  TM Distance: <3 FB Neck ROM: limited    Dental  (+) Poor Dentition, Chipped, Caps   Pulmonary shortness of breath and with exertion, pneumonia, COPD,  COPD inhaler and oxygen dependent, former smoker,    Pulmonary exam normal breath sounds clear to auscultation       Cardiovascular Exercise Tolerance: Poor hypertension, (-) angina+ Peripheral Vascular Disease and +CHF  (-) Past MI and (-) DOE Normal cardiovascular exam Rhythm:regular Rate:Normal     Neuro/Psych PSYCHIATRIC DISORDERS Depression  Neuromuscular disease    GI/Hepatic negative GI ROS, Neg liver ROS, GERD  Controlled,  Endo/Other  Hypothyroidism   Renal/GU negative Renal ROS  negative genitourinary   Musculoskeletal  (+) Arthritis ,   Abdominal   Peds  Hematology negative hematology ROS (+)   Anesthesia Other Findings Past Medical History: No date: Antalgic gait No date: Carotid artery occlusion No date: Chronic neck and back pain No date: Complication of anesthesia     Comment: pt has had cervical fusion-limited neck               flextion No date: COPD (chronic obstructive pulmonary disease) (* No date: Depression No date: DJD (degenerative joint disease)     Comment: Right shoulder No date: Hypertension No date: Lumbago No date: Peripheral neuropathy (HCC)  Past Surgical History: 2009: ABDOMINAL AORTIC ANEURYSM REPAIR     Comment: Endovascular AAA repair No date: ABDOMINAL AORTIC ANEURYSM REPAIR 09/09/2016: CAROTID ANGIOGRAPHY     Comment: Procedure: Carotid Angiography;  Surgeon:               Nada Libman, MD;  [facility]: [facility];  Service: Cardiovascular;;  rt. carotid No date: CARPAL TUNNEL RELEASE     Comment: bilateral CTS 12/10/2011: HAMMER TOE SURGERY     Comment: Procedure: HAMMER TOE CORRECTION;  Surgeon:               Ernestene Kiel, DPM;  Location: [facility];  Service: Podiatry;  Laterality: Right;              Right hammertoe repair second right with 2.5x42              orthopro screw  04/05/2016: IR GENERIC HISTORICAL     Comment: IR GASTROSTOMY TUBE MOD SED 04/05/2016               [facility] 05/12/2016: IR GENERIC HISTORICAL     Comment: IR CM INJ ANY COLONIC TUBE W/FLUORO 05/12/2016              Malachy Moan, MD [facility] 05/26/2016: IR GENERIC HISTORICAL     Comment: IR CM INJ ANY COLONIC TUBE W/FLUORO 05/26/2016               [facility] 05/28/2016: IR GENERIC HISTORICAL     Comment: IR REPLC GASTRO/COLONIC TUBE Cristi Loron W/FLUORO               05/28/2016 Brayton El, PA-C [facility] 07/02/2016: IR GENERIC HISTORICAL     Comment: IR GASTROSTOMY TUBE REMOVAL 07/02/2016 Caryn Bee  Bruning, PA-C [facility] 12/10/2011: METATARSAL OSTEOTOMY     Comment: Procedure: METATARSAL OSTEOTOMY;  Surgeon: Max              Al Corpus, DPM;  Location:  SURGERY               CENTER;  Service: Podiatry;  Laterality: Right;              Right Keller arthroplasty with size 5 silicone               implant right great toe; second metatarsal               osteotomy with 2.0x14 screw No date: NECK SURGERY 02/11/2015: PERIPHERAL VASCULAR CATHETERIZATION N/A     Comment: Procedure: Carotid Angiography;  Surgeon:               Nada Libman, MD;  [facility]: [facility];  Service: Cardiovascular;  Laterality:               N/A; No date: PILONIDAL CYST / SINUS EXCISION No date: Rotary cuff 09/15/11: SPINE SURGERY     Comment: Scar tissue removed- back 09/15/11: SPINE SURGERY     Comment: Scar tissue removed- back No date:  TONSILLECTOMY 03/03/2016: TRACHEOSTOMY TUBE PLACEMENT N/A     Comment: Procedure: TRACHEOSTOMY;  Surgeon: Geanie Logan, MD;  Location: [facility] ORS;  Service:               ENT;  Laterality: N/A;  BMI    Body Mass Index:  31.63 kg/m      Reproductive/Obstetrics                             Anesthesia Physical Anesthesia Plan  ASA: III  Anesthesia Plan: General   Post-op Pain Management:    Induction:   Airway Management Planned:   Additional Equipment:   Intra-op Plan:   Post-operative Plan:   Informed Consent: I have reviewed the patients History and Physical, chart, labs and discussed the procedure including the risks, benefits and alternatives for the proposed anesthesia with the patient or authorized representative who has indicated his/her understanding and acceptance.   Dental Advisory Given  Plan Discussed with: Anesthesiologist, CRNA and Surgeon  Anesthesia Plan Comments:         Anesthesia Quick Evaluation

## 2016-09-28 NOTE — Op Note (Signed)
[facility] Gastroenterology Patient Name: Chad Huffman Procedure Date: 09/28/2016 10:20 AM MRN: [id number] Account #: [id number] Date of Birth: July 10, 1945 Admit Type: Outpatient Age: 71 Room: [facility] ENDO ROOM 4 Gender: Male Note Status: Finalized Procedure:            Upper GI endoscopy Indications:          Hematemesis Providers:            Midge Minium MD, MD Referring MD:         Teena Irani. Terance Hart, MD (Referring MD) Medicines:            Propofol per Anesthesia Complications:        No immediate complications. Procedure:            Pre-Anesthesia Assessment:                       - Prior to the procedure, a History and Physical was                        performed, and patient medications and allergies were                        reviewed. The patient's tolerance of previous                        anesthesia was also reviewed. The risks and benefits of                        the procedure and the sedation options and risks were                        discussed with the patient. All questions were                        answered, and informed consent was obtained. Prior                        Anticoagulants: The patient has taken no previous                        anticoagulant or antiplatelet agents. ASA Grade                        Assessment: II - A patient with mild systemic disease.                        After reviewing the risks and benefits, the patient was                        deemed in satisfactory condition to undergo the                        procedure.                       After obtaining informed consent, the endoscope was                        passed under direct vision. Throughout the procedure,  the patient's blood pressure, pulse, and oxygen                        saturations were monitored continuously. The                        Colonoscope was introduced through the mouth, and                        advanced to the  second part of duodenum. The upper GI                        endoscopy was accomplished without difficulty. The                        patient tolerated the procedure well. Findings:      The examined esophagus was normal.      There was evidence of a closed previous gastrostomy present in the       gastric body.      The examined duodenum was normal. Impression:           - Normal esophagus.                       - Closed previous gastrostomy present.                       - Normal examined duodenum.                       - No specimens collected. Recommendation:       - Return patient to hospital ward for ongoing care.                       - Advance diet as tolerated. Procedure Code(s):    --- Professional ---                       [phone], Esophagogastroduodenoscopy, flexible, transoral;                        diagnostic, including collection of specimen(s) by                        brushing or washing, when performed (separate procedure) Diagnosis Code(s):    --- Professional ---                       K92.0, Hematemesis CPT copyright 2016 American Medical Association. All rights reserved. The codes documented in this report are preliminary and upon coder review may  be revised to meet current compliance requirements. Midge Minium MD, MD 09/28/2016 11:02:45 AM This report has been signed electronically. Number of Addenda: 0 Note Initiated On: 09/28/2016 10:20 AM      [facility]

## 2016-09-28 NOTE — Clinical Social Work Note (Signed)
Clinical Social Work Assessment  Patient Details  Name: Chad Huffman MRN: [id number] Date of Birth: [birth year]  Date of referral:  09/28/16               Reason for consult:  [employer]                Permission sought to share information with:  Case Optician, dispensing granted to share information::  Yes, Verbal Permission Granted  Name::        Agency::     Relationship::     Contact Information:     Housing/Transportation Living arrangements for the past 2 months:  Single Family Home Source of Information:  Patient Patient Interpreter Needed:  None Criminal Activity/Legal Involvement Pertinent to Current Situation/Hospitalization:  No - Comment as needed Significant Relationships:  Friend Lives with:  Self Do you feel safe going back to the place where you live?  Yes Need for family participation in patient care:  Yes (Comment)  Care giving concerns:  Patient lives alone in [place].    Social Worker assessment / plan:  Holiday representative (CSW) received COPD gold consult. PT is recommending home health. Per RN and PT patient's mental status has improved greatly today. RN case manager will arrange home health. CSW met with patient alone at bedside to complete assessment. Patient was alert and oriented X4 and was sitting up in the bed. Patient reported that he lives alone and his friend Chad Huffman gets him food and provides transportation. Per patient he is paying [place] privately and he stated she is worth it. Patient reported that he has [employer] PT, RN and aide. CSW made patient aware that RN case manager is adding a Education officer, museum to home health. Patient stated that he is also on chronic oxygen thorough [facility]. Patient denied depression and anxiety symptoms. Patient reported that he has lived with COPD for a while and has learned how to deal with it. Patient reported that he has been to [facility] in the past and did not have a good experience.  CSW explained to patient that he is out of medicare SNF days and will need a 60 day wellness period before his days start over. Patient reported no other needs or concerns at this time. Please reconsult if future social work needs arise. CSW signing off.    Employment status:  Retired Forensic scientist:  Medicare PT Recommendations:  Home with [place] / Referral to community resources:  Other (Comment Required) (RN case manager to arrange home health. )  Patient/Family's Response to care:  Patient is agreeable for going home with home health.   Patient/Family's Understanding of and Emotional Response to Diagnosis, Current Treatment, and Prognosis:  Patient was pleasant and thanked CSW for visit.   Emotional Assessment Appearance:  Appears stated age Attitude/Demeanor/Rapport:    Affect (typically observed):  Accepting, Adaptable, Pleasant Orientation:  Oriented to Self, Oriented to Place, Oriented to  Time, Oriented to Situation, Fluctuating Orientation (Suspected and/or reported Sundowners) Alcohol / Substance use:  Not Applicable Psych involvement (Current and /or in the community):  No (Comment)  Discharge Needs  Concerns to be addressed:  No discharge needs identified Readmission within the last 30 days:  Yes Current discharge risk:  Chronically ill Barriers to Discharge:  No Barriers Identified   Tommy Goostree, Veronia Beets, LCSW 09/28/2016, 3:55 PM

## 2016-09-29 ENCOUNTER — Encounter: Payer: Self-pay | Admitting: Family

## 2016-09-29 ENCOUNTER — Ambulatory Visit: Payer: Medicare Other | Attending: Family | Admitting: Family

## 2016-09-29 VITALS — BP 95/64 | HR 103 | Resp 18 | Ht 68.0 in | Wt 206.0 lb

## 2016-09-29 DIAGNOSIS — J410 Simple chronic bronchitis: Secondary | ICD-10-CM

## 2016-09-29 DIAGNOSIS — Z87891 Personal history of nicotine dependence: Secondary | ICD-10-CM | POA: Insufficient documentation

## 2016-09-29 DIAGNOSIS — Z7951 Long term (current) use of inhaled steroids: Secondary | ICD-10-CM | POA: Insufficient documentation

## 2016-09-29 DIAGNOSIS — Z7982 Long term (current) use of aspirin: Secondary | ICD-10-CM | POA: Insufficient documentation

## 2016-09-29 DIAGNOSIS — Z981 Arthrodesis status: Secondary | ICD-10-CM | POA: Diagnosis not present

## 2016-09-29 DIAGNOSIS — M19011 Primary osteoarthritis, right shoulder: Secondary | ICD-10-CM | POA: Diagnosis not present

## 2016-09-29 DIAGNOSIS — Z79899 Other long term (current) drug therapy: Secondary | ICD-10-CM | POA: Diagnosis not present

## 2016-09-29 DIAGNOSIS — I5032 Chronic diastolic (congestive) heart failure: Secondary | ICD-10-CM | POA: Diagnosis present

## 2016-09-29 DIAGNOSIS — I110 Hypertensive heart disease with heart failure: Secondary | ICD-10-CM | POA: Diagnosis not present

## 2016-09-29 DIAGNOSIS — Z7902 Long term (current) use of antithrombotics/antiplatelets: Secondary | ICD-10-CM | POA: Diagnosis not present

## 2016-09-29 DIAGNOSIS — Z888 Allergy status to other drugs, medicaments and biological substances status: Secondary | ICD-10-CM | POA: Diagnosis not present

## 2016-09-29 DIAGNOSIS — J449 Chronic obstructive pulmonary disease, unspecified: Secondary | ICD-10-CM | POA: Diagnosis not present

## 2016-09-29 DIAGNOSIS — F329 Major depressive disorder, single episode, unspecified: Secondary | ICD-10-CM | POA: Diagnosis not present

## 2016-09-29 DIAGNOSIS — Z880 Allergy status to penicillin: Secondary | ICD-10-CM | POA: Diagnosis not present

## 2016-09-29 DIAGNOSIS — Z8249 Family history of ischemic heart disease and other diseases of the circulatory system: Secondary | ICD-10-CM | POA: Insufficient documentation

## 2016-09-29 DIAGNOSIS — Z9981 Dependence on supplemental oxygen: Secondary | ICD-10-CM | POA: Diagnosis not present

## 2016-09-29 DIAGNOSIS — G629 Polyneuropathy, unspecified: Secondary | ICD-10-CM | POA: Diagnosis not present

## 2016-09-29 DIAGNOSIS — R55 Syncope and collapse: Secondary | ICD-10-CM

## 2016-09-29 DIAGNOSIS — I10 Essential (primary) hypertension: Secondary | ICD-10-CM

## 2016-09-29 NOTE — Progress Notes (Signed)
Patient ID: Chad Huffman, male    DOB: 1946/05/09, 71 y.o.   MRN: [id number]  HPI  Chad Huffman is a 71 y/o male with a history of HTN, DJD, COPD with oxygen, cervical fusion, previous tobacco use and chronic heart failure.   Last echo was done 07/17/16 and showed an EF of 60% along with trivial AR and mild Chad. Normal pulmonary artery pressures. This is comparable to an echo done 08/07/14.  Admitted 09/26/16 with hypercapnia due to COPD exacerbation along with acute delirium. Initially treated wit IV prednisone and transitioned to oral prednisone. Given oxygen and inhalers with recommended followup with Dr. Meredeth Ide. Acute renal failure developed and lisinopril and torsemide were held. Discharged home after 2 days. Admitted 09/13/16 due to recurrent syncope. Neurology consult obtained. 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 Antibiotics given for UTI. Discharged home after 3 days with home health. Admitted 08/19/16 due to acute on chronic respiratory failure due to COPD exacerbation. Refused bipap and oxygen was continued. Has had a trach in the recent past with reversal just done January 2018. Discharged to SNF.   Chad Huffman presents today for his initial visit with a chief complaint of fatigue upon minimal exertion. Improves quickly upon rest. Denies any shortness of breath or swelling in his legs/abdomen. Has chronic neck stiffness due to cervical fusion. Wears oxygen at 4L when at home and at bedtime. Currently receiving physical therapy at home along with home health nurse. Has an aide to assist with ADL's.   Past Medical History:  Diagnosis Date  . Antalgic gait   . Carotid artery occlusion   . Chronic neck and back pain   . Complication of anesthesia    pt has had cervical fusion-limited neck flextion  . COPD (chronic obstructive pulmonary disease) (HCC)   . Depression   . DJD (degenerative joint disease)    Right shoulder  . Hypertension   . Lumbago   . Peripheral neuropathy [facility])    Past  Surgical History:  Procedure Laterality Date  . ABDOMINAL AORTIC ANEURYSM REPAIR  2009   Endovascular AAA repair  . ABDOMINAL AORTIC ANEURYSM REPAIR    . CAROTID ANGIOGRAPHY  09/09/2016   Procedure: Carotid Angiography;  Surgeon: Nada Libman, MD;  Location: [facility] INVASIVE CV LAB;  Service: Cardiovascular;;  rt. carotid  . CARPAL TUNNEL RELEASE     bilateral CTS  . ESOPHAGOGASTRODUODENOSCOPY (EGD) WITH PROPOFOL N/A 09/28/2016   Procedure: ESOPHAGOGASTRODUODENOSCOPY (EGD) WITH PROPOFOL;  Surgeon: Midge Minium, MD;  Location: [facility] ENDOSCOPY;  Service: Endoscopy;  Laterality: N/A;  . HAMMER TOE SURGERY  12/10/2011   Procedure: HAMMER TOE CORRECTION;  Surgeon: Ernestene Kiel, DPM;  Location: [facility];  Service: Podiatry;  Laterality: Right;  Right hammertoe repair second right with 2.5x42 orthopro screw   . IR GENERIC HISTORICAL  04/05/2016   IR GASTROSTOMY TUBE MOD SED 04/05/2016 [facility]  . IR GENERIC HISTORICAL  05/12/2016   IR CM INJ ANY COLONIC TUBE W/FLUORO 05/12/2016 Malachy Moan, MD [facility]  . IR GENERIC HISTORICAL  05/26/2016   IR CM INJ ANY COLONIC TUBE W/FLUORO 05/26/2016 [facility]  . IR GENERIC HISTORICAL  05/28/2016   IR REPLC GASTRO/COLONIC TUBE PERCUT W/FLUORO 05/28/2016 Brayton El, PA-C [facility]  . IR GENERIC HISTORICAL  07/02/2016   IR GASTROSTOMY TUBE REMOVAL 07/02/2016 Brayton El, PA-C [facility]  . METATARSAL OSTEOTOMY  12/10/2011   Procedure: METATARSAL OSTEOTOMY;  Surgeon: Ernestene Kiel, DPM;  Location: [facility];  Service: Podiatry;  Laterality: Right;  Right Keller arthroplasty with size 5 silicone implant right great toe; second metatarsal osteotomy with 2.0x14 screw  . NECK SURGERY    . PERIPHERAL VASCULAR CATHETERIZATION N/A 02/11/2015   Procedure: Carotid Angiography;  Surgeon: Nada Libman, MD;  Location: [facility] INVASIVE CV LAB;  Service: Cardiovascular;  Laterality: N/A;  . PILONIDAL CYST / SINUS EXCISION    . Rotary  cuff    . SPINE SURGERY  09/15/11   Scar tissue removed- back  . SPINE SURGERY  09/15/11   Scar tissue removed- back  . TONSILLECTOMY    . TRACHEOSTOMY TUBE PLACEMENT N/A 03/03/2016   Procedure: TRACHEOSTOMY;  Surgeon: Geanie Logan, MD;  Location: [facility] ORS;  Service: ENT;  Laterality: N/A;   Family History  Problem Relation Age of Onset  . Stomach cancer Mother   . Hypertension Brother   . Heart attack Father    Social History  Substance Use Topics  . Smoking status: Former Smoker    Quit date: 08/30/2006  . Smokeless tobacco: Never Used  . Alcohol use 1.2 oz/week    2 Cans of beer per week     Comment: occasional use   Allergies  Allergen Reactions  . Ampicillin Swelling and Other (See Comments)    Unable to obtain enough information to answer additional questions about this medication.  Has patient had a PCN reaction causing immediate rash, facial/tongue/throat swelling, SOB or lightheadedness with hypotension: Yes Has patient had a PCN reaction causing severe rash involving mucus membranes or skin necrosis: No Has patient had a PCN reaction that required hospitalization No Has patient had a PCN reaction occurring within the last 10 years: Yes If all of the above answers are "NO", then may pr  . Nortriptyline Other (See Comments)    Sleep walking  . Brimonidine Other (See Comments)    Unknown reaction per MAR   . Cyclobenzaprine Other (See Comments)    Hallucinations  . Tizanidine Other (See Comments)    Hallucinations  . Gabapentin Rash   Prior to Admission medications   Medication Sig Start Date End Date Taking? Authorizing Provider  albuterol (ACCUNEB) 1.25 MG/3ML nebulizer solution Take 1 ampule by nebulization every 4 (four) hours as needed for wheezing.   Yes Historical Provider, MD  aspirin EC 81 MG tablet Take 81 mg by mouth daily.   Yes Historical Provider, MD  budesonide (PULMICORT) 0.5 MG/2ML nebulizer solution Take 0.5 mg by nebulization 2 (two) times daily.    Yes Historical Provider, MD  clonazePAM (KLONOPIN) 0.5 MG tablet Take 1 tablet (0.5 mg total) by mouth 2 (two) times daily. 08/23/16  Yes Katharina Caper, MD  clopidogrel (PLAVIX) 75 MG tablet Take 75 mg by mouth daily.   Yes Historical Provider, MD  folic acid (FOLVITE) 1 MG tablet Take 1 mg by mouth daily.    Yes Historical Provider, MD  levothyroxine (SYNTHROID, LEVOTHROID) 75 MCG tablet Take 1 tablet (75 mcg total) by mouth daily before breakfast. 09/16/16  Yes Enid Baas, MD  metoprolol tartrate (LOPRESSOR) 25 MG tablet Take 1 tablet (25 mg total) by mouth 2 (two) times daily. 07/27/16  Yes Katharina Caper, MD  Multiple Vitamin (MULTIVITAMIN WITH MINERALS) TABS tablet Take 1 tablet by mouth daily.   Yes Historical Provider, MD  oxyCODONE (OXY IR/ROXICODONE) 5 MG immediate release tablet Take 1 tablet (5 mg total) by mouth every 8 (eight) hours as needed for moderate pain. 09/16/16  Yes Enid Baas,  MD  OXYGEN 4 L/min by Intratracheal route as needed (for shortness of breath).    Yes Historical Provider, MD  predniSONE (DELTASONE) 10 MG tablet 40 mg po daily for 2 days. 20 mg po daily for 2 days.10 mg po daily for 3 days. 09/29/16  Yes Shaune Pollack, MD  QUEtiapine (SEROQUEL) 50 MG tablet Take 50 mg by mouth at bedtime.   Yes Historical Provider, MD  sertraline (ZOLOFT) 50 MG tablet Take 75 mg by mouth daily.    Yes Historical Provider, MD  SYMBICORT 160-4.5 MCG/ACT inhaler Inhale 2 puffs into the lungs 2 (two) times daily. 08/04/16  Yes Historical Provider, MD    Review of Systems  Constitutional: Positive for fatigue. Negative for appetite change.  HENT: Negative for congestion, postnasal drip and sore throat.   Eyes: Negative.   Respiratory: Negative for chest tightness, shortness of breath and wheezing.   Cardiovascular: Positive for palpitations (infrequent). Negative for chest pain and leg swelling.  Gastrointestinal: Negative for abdominal distention and abdominal pain.  Endocrine:  Negative.   Genitourinary: Negative.   Musculoskeletal: Positive for neck pain and neck stiffness.  Skin: Negative.   Allergic/Immunologic: Negative.   Neurological: Negative for dizziness and light-headedness.  Hematological: Negative for adenopathy. Bruises/bleeds easily.  Psychiatric/Behavioral: Negative for dysphoric mood, sleep disturbance (sleeping on 1 pillow with oxygen at 4L) and suicidal ideas. The patient is not nervous/anxious.    Vitals:   09/29/16 0945  BP: 95/64  Pulse: (!) 103  Resp: 18  Weight: 206 lb (93.4 kg)  Height:  (1.727 m)   Wt Readings from Last 3 Encounters:  09/29/16 206 lb (93.4 kg)  09/26/16 208 lb (94.3 kg)  09/13/16 201 lb 12.8 oz (91.5 kg)   Lab Results  Component Value Date   CREATININE 1.27 (H) 09/28/2016   CREATININE 1.35 (H) 09/27/2016   CREATININE 2.09 (H) 09/26/2016   Physical Exam  Constitutional: Chad Huffman is oriented to person, place, and time. Chad Huffman appears well-developed and well-nourished.  HENT:  Head: Normocephalic and atraumatic.  Eyes: Conjunctivae are normal. Pupils are equal, round, and reactive to light.  Neck: No JVD present. Decreased range of motion present.  Cardiovascular: Regular rhythm.  Tachycardia present.   Pulmonary/Chest: Effort normal. Chad Huffman has no wheezes. Chad Huffman has no rales.  Abdominal: Soft. Chad Huffman exhibits no distension. There is no tenderness.  Musculoskeletal: Chad Huffman exhibits no edema.  Neurological: Chad Huffman is alert and oriented to person, place, and time.  Skin: Skin is warm and dry.  Psychiatric: Chad Huffman has a normal mood and affect. His behavior is normal. Thought content normal.  Nursing note and vitals reviewed.   Assessment & Plan:  1: Chronic heart failure with preserved ejection fraction- - NYHA class III - euvolemic - weighing daily. Advised to call for an overnight weight gain of >2 pounds or a weekly weight gain of >5 pounds - not adding salt. Family member with him does the shopping and has recently started  reading food labels. Discussed the importance of closely following a  sodium diet and written dietary information was given to him about this. - currently not taking torsemide or lisinopril. No need to restart at this time - Pharm D went in and reviewed medications with the patient  2: HTN- - BP on the low side and torsemide/lisinopril being held - denies dizziness - sees PCP (Bronstein) in the next couple of weeks  3: COPD- - has oxygen at 4L that Chad Huffman wears around the clock when at home.  Going to discuss with provider about ordering a portable tank for when Chad Huffman's out - sees pulmonologist Sung Amabile) 10/28/16  4: Syncope-  - reports episodes of "passing out". No forewarning and Chad Huffman wakes up on his own. - will not resume torsemide or lisinopril at this time  - does have an aide that assists with ADL's along with weekly home health nurse and physical therapist  Return in 1 month or sooner for any questions/problems before then.

## 2016-09-29 NOTE — Patient Instructions (Signed)
Continue weighing daily and call for an overnight weight gain of > 2 pounds or a weekly weight gain of >5 pounds. 

## 2016-10-27 ENCOUNTER — Ambulatory Visit: Payer: Medicare Other | Admitting: Family

## 2016-10-28 ENCOUNTER — Ambulatory Visit (INDEPENDENT_AMBULATORY_CARE_PROVIDER_SITE_OTHER): Payer: Medicare Other | Admitting: Pulmonary Disease

## 2016-10-28 ENCOUNTER — Ambulatory Visit: Payer: Medicare Other | Attending: Family | Admitting: Family

## 2016-10-28 ENCOUNTER — Encounter: Payer: Self-pay | Admitting: Pulmonary Disease

## 2016-10-28 VITALS — BP 126/72 | HR 75 | Ht 68.0 in | Wt 211.0 lb

## 2016-10-28 VITALS — BP 125/62 | HR 78 | Resp 20 | Ht 68.0 in | Wt 211.0 lb

## 2016-10-28 DIAGNOSIS — M19011 Primary osteoarthritis, right shoulder: Secondary | ICD-10-CM | POA: Insufficient documentation

## 2016-10-28 DIAGNOSIS — Z79899 Other long term (current) drug therapy: Secondary | ICD-10-CM | POA: Insufficient documentation

## 2016-10-28 DIAGNOSIS — I10 Essential (primary) hypertension: Secondary | ICD-10-CM

## 2016-10-28 DIAGNOSIS — I6523 Occlusion and stenosis of bilateral carotid arteries: Secondary | ICD-10-CM | POA: Diagnosis not present

## 2016-10-28 DIAGNOSIS — Z79891 Long term (current) use of opiate analgesic: Secondary | ICD-10-CM | POA: Insufficient documentation

## 2016-10-28 DIAGNOSIS — G629 Polyneuropathy, unspecified: Secondary | ICD-10-CM | POA: Diagnosis not present

## 2016-10-28 DIAGNOSIS — J449 Chronic obstructive pulmonary disease, unspecified: Secondary | ICD-10-CM

## 2016-10-28 DIAGNOSIS — I110 Hypertensive heart disease with heart failure: Secondary | ICD-10-CM | POA: Diagnosis present

## 2016-10-28 DIAGNOSIS — Z87891 Personal history of nicotine dependence: Secondary | ICD-10-CM | POA: Diagnosis not present

## 2016-10-28 DIAGNOSIS — F329 Major depressive disorder, single episode, unspecified: Secondary | ICD-10-CM | POA: Diagnosis not present

## 2016-10-28 DIAGNOSIS — J9611 Chronic respiratory failure with hypoxia: Secondary | ICD-10-CM | POA: Diagnosis not present

## 2016-10-28 DIAGNOSIS — J410 Simple chronic bronchitis: Secondary | ICD-10-CM

## 2016-10-28 DIAGNOSIS — Z880 Allergy status to penicillin: Secondary | ICD-10-CM | POA: Insufficient documentation

## 2016-10-28 DIAGNOSIS — Z9889 Other specified postprocedural states: Secondary | ICD-10-CM | POA: Insufficient documentation

## 2016-10-28 DIAGNOSIS — Z7982 Long term (current) use of aspirin: Secondary | ICD-10-CM | POA: Insufficient documentation

## 2016-10-28 DIAGNOSIS — R55 Syncope and collapse: Secondary | ICD-10-CM | POA: Diagnosis not present

## 2016-10-28 DIAGNOSIS — Z888 Allergy status to other drugs, medicaments and biological substances status: Secondary | ICD-10-CM | POA: Insufficient documentation

## 2016-10-28 DIAGNOSIS — I5032 Chronic diastolic (congestive) heart failure: Secondary | ICD-10-CM

## 2016-10-28 NOTE — Patient Instructions (Signed)
Continue your current medications. We discussed pulmonary rehabilitation program today. I think you would benefit from this program. If you are interested, you may call our office at any time and we will get you enrolled.  Follow-up in 3 months or sooner as needed

## 2016-10-28 NOTE — Patient Instructions (Signed)
Continue weighing daily and call for an overnight weight gain of > 2 pounds or a weekly weight gain of >5 pounds. 

## 2016-10-28 NOTE — Progress Notes (Signed)
PULMONARY OFFICE FOLLOW UP NOTE   PROBLEMS:  Former smoker Very severe COPD Difficult intubation Status post cervical spine fusion Chronically elevated right hemidiaphragm Prolonged hospitalization September 2017 to January 2018  Underwent emergency tracheostomy tube placement 03/03/2016  [facility]  G tube removed 07/02/16  [facility]  Decannulated in early Jan 2018  Discharged to home 07/05/16 Re-admitted 07/16/16 with acute respiratory failure due to COPD with hypercarbic resp failure  Extubated 07/21/16    DATA: CT chest 03/23/16: Right hemidiaphragm is elevated. No pleural fluid on the left. Minimal hazy infiltrate present in the apex of the left upper lobe. The right chest shows a small effusion largely layering dependently. There is extensive airspace filling in the upper lobe consistent with bronchopneumonia. There is patchy density in the right lower lobe in the right middle lobe that could be atelectasis or lesser bronchopneumonia. The appearance is improved since the CT of 03/03/16 Echocardiogram 07/17/16: grade I diastolic dysfunction. LVEF 60%. LA mildly dilated. RV mildly dilated  INTERVAL HISTORY: Hospitalized 04/08-04/10/18 for hypercapnic respiratory failure  SUBJ: This is a routine post hospital follow-up. His discharge, Chad Huffman has done reasonably well. He continues to have moderately limiting shortness of breath. However he also has significant neurologic impairment which limits his exertional capacity. He describes pain around the tracheostomy stoma site with scant purulent drainage. The tracheostomy tube was placed by Dr. Willeen CassBennett. Denies CP, fever, purulent sputum, hemoptysis, LE edema and calf tenderness.   OBJ: Vitals:   10/28/16 0905 10/28/16 0907  BP:  126/72  Pulse:  75  SpO2:  91%  Weight: 211 lb (95.7 kg)   Height: 5\' 8"  (1.727 m)   Room air  HEENT are normal. Neck exhibits very limited  mobility.  Chest examination reveals mildly diminished breath sounds without wheezes or other adventitious sounds Cardiac exam reveals a regular rate and rhythm with no murmurs noted. Abdomen is soft, nontender, normal bowel sounds. Extremities are without clubbing cyanosis or edema   DATA: BMP Latest Ref Rng & Units 09/28/2016 09/27/2016 09/26/2016  Glucose 65 - 99 mg/dL 956(O110(H) 130(Q101(H) 657(Q119(H)  BUN 6 - 20 mg/dL 46(N38(H) 62(X31(H) 52(W42(H)  Creatinine 0.61 - 1.24 mg/dL 4.13(K1.27(H) 4.40(N1.35(H) 0.27(O2.09(H)  Sodium 135 - 145 mmol/L 137 138 134(L)  Potassium 3.5 - 5.1 mmol/L 4.8 4.5 4.0  Chloride 101 - 111 mmol/L 98(L) 98(L) 94(L)  CO2 22 - 32 mmol/L 33(H) 33(H) 33(H)  Calcium 8.9 - 10.3 mg/dL 5.3(G8.4(L) 6.4(Q8.4(L) 0.3(K8.6(L)   CBC Latest Ref Rng & Units 09/28/2016 09/27/2016 09/26/2016  WBC 3.8 - 10.6 K/uL - 8.4 -  Hemoglobin 13.0 - 18.0 g/dL 10.7(L) 12.3(L) 11.9(L)  Hematocrit 40.0 - 52.0 % - 37.5(L) -  Platelets 150 - 440 K/uL - 158 -   CXR: NNF IMPRESSION: Chronic respiratory failure with hypoxia and hypercapnia Chronic obstructive pulmonary disease, Severe 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 He wants to consider this and will call us if he desires enrollment 7) Follow up in 3 months   Chad Fischeravid Altonio Schwertner, MD PCCM service Mobile [phone] Pager [phone] 10/28/2016 3:30 PM

## 2016-10-28 NOTE — Progress Notes (Signed)
Patient ID: Chad Huffman, male    DOB: 01-14-1946, 71 y.o.   MRN: [id number]  HPI  Chad Huffman is a 71 y/o male with a history of HTN, DJD, COPD with oxygen, cervical fusion, previous tobacco use and chronic heart failure.   Last echo was done 07/17/16 and showed an EF of 60% along with trivial AR and mild Chad. Normal pulmonary artery pressures. This is comparable to an echo done 08/07/14.  Admitted 09/26/16 with hypercapnia due to COPD exacerbation along with acute delirium. Initially treated wit IV prednisone and transitioned to oral prednisone. Given oxygen and inhalers with recommended followup with Chad Huffman. Acute renal failure developed and lisinopril and torsemide were held. Discharged home after 2 days. Admitted 09/13/16 due to recurrent syncope. Neurology consult obtained. 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 Antibiotics given for UTI. Discharged home after 3 days with home health. Admitted 08/19/16 due to acute on chronic respiratory failure due to COPD exacerbation. Refused bipap and oxygen was continued. Has had a trach in the recent past with reversal just done January 2018. Discharged to SNF.   He presents today with a chief complaint of a follow-up visit. He denies any shortness of breath, fatigue or edema. Does have chronic neck stiffness and is participating in physical therapy twice a week.    Past Medical History:  Diagnosis Date  . Antalgic gait   . Carotid artery occlusion   . Chronic neck and back pain   . Complication of anesthesia    pt has had cervical fusion-limited neck flextion  . COPD (chronic obstructive pulmonary disease) (HCC)   . Depression   . DJD (degenerative joint disease)    Right shoulder  . Hypertension   . Lumbago   . Peripheral neuropathy    Past Surgical History:  Procedure Laterality Date  . ABDOMINAL AORTIC ANEURYSM REPAIR  2009   Endovascular AAA repair  . ABDOMINAL AORTIC ANEURYSM REPAIR    . CAROTID ANGIOGRAPHY  09/09/2016   Procedure: Carotid Angiography;  Surgeon: Nada Libman, MD;  Location: [facility] INVASIVE CV LAB;  Service: Cardiovascular;;  rt. carotid  . CARPAL TUNNEL RELEASE     bilateral CTS  . ESOPHAGOGASTRODUODENOSCOPY (EGD) WITH PROPOFOL N/A 09/28/2016   Procedure: ESOPHAGOGASTRODUODENOSCOPY (EGD) WITH PROPOFOL;  Surgeon: Midge Minium, MD;  Location: [facility] ENDOSCOPY;  Service: Endoscopy;  Laterality: N/A;  . HAMMER TOE SURGERY  12/10/2011   Procedure: HAMMER TOE CORRECTION;  Surgeon: Ernestene Kiel, DPM;  Location: [facility];  Service: Podiatry;  Laterality: Right;  Right hammertoe repair second right with 2.5x42 orthopro screw   . IR GENERIC HISTORICAL  04/05/2016   IR GASTROSTOMY TUBE MOD SED 04/05/2016 [facility]  . IR GENERIC HISTORICAL  05/12/2016   IR CM INJ ANY COLONIC TUBE W/FLUORO 05/12/2016 Chad Moan, MD [facility]  . IR GENERIC HISTORICAL  05/26/2016   IR CM INJ ANY COLONIC TUBE W/FLUORO 05/26/2016 [facility]  . IR GENERIC HISTORICAL  05/28/2016   IR REPLC GASTRO/COLONIC TUBE PERCUT W/FLUORO 05/28/2016 Chad El, PA-C [facility]  . IR GENERIC HISTORICAL  07/02/2016   IR GASTROSTOMY TUBE REMOVAL 07/02/2016 Chad El, PA-C [facility]  . METATARSAL OSTEOTOMY  12/10/2011   Procedure: METATARSAL OSTEOTOMY;  Surgeon: Ernestene Kiel, DPM;  Location: [facility];  Service: Podiatry;  Laterality: Right;  Right Keller arthroplasty with size 5 silicone implant right great toe; second metatarsal osteotomy with 2.0x14 screw  . NECK SURGERY    .  PERIPHERAL VASCULAR CATHETERIZATION N/A 02/11/2015   Procedure: Carotid Angiography;  Surgeon: Nada LibmanVance W Brabham, MD;  Location[facility] INVASIVE CV LAB;  Service: Cardiovascular;  Laterality: N/A;  . PILONIDAL CYST / SINUS EXCISION    . Rotary cuff    . SPINE SURGERY  09/15/11   Scar tissue removed- back  . SPINE SURGERY  09/15/11   Scar tissue removed- back  . TONSILLECTOMY    . TRACHEOSTOMY TUBE PLACEMENT N/A 03/03/2016    Procedure: TRACHEOSTOMY;  Surgeon: Geanie LoganPaul Bennett, MD;  Location: [facility] ORS;  Service: ENT;  Laterality: N/A;   Family History  Problem Relation Age of Onset  . Stomach cancer Mother   . Hypertension Brother   . Heart attack Father    Social History  Substance Use Topics  . Smoking status: Former Smoker    Quit date: 08/30/2006  . Smokeless tobacco: Never Used  . Alcohol use 1.2 oz/week    2 Cans of beer per week     Comment: occasional use   Allergies  Allergen Reactions  . Ampicillin Swelling and Other (See Comments)    Unable to obtain enough information to answer additional questions about this medication.  Has patient had a PCN reaction causing immediate rash, facial/tongue/throat swelling, SOB or lightheadedness with hypotension: Yes Has patient had a PCN reaction causing severe rash involving mucus membranes or skin necrosis: No Has patient had a PCN reaction that required hospitalization No Has patient had a PCN reaction occurring within the last 10 years: Yes If all of the above answers are "NO", then may pr  . Nortriptyline Other (See Comments)    Sleep walking  . Brimonidine Other (See Comments)    Unknown reaction per MAR   . Cyclobenzaprine Other (See Comments)    Hallucinations  . Tizanidine Other (See Comments)    Hallucinations  . Gabapentin Rash   Prior to Admission medications   Medication Sig Start Date End Date Taking? Authorizing Provider  albuterol (ACCUNEB) 1.25 MG/3ML nebulizer solution Take 1 ampule by nebulization every 4 (four) hours as needed for wheezing.   Yes Historical Provider, MD  aspirin EC 81 MG tablet Take 81 mg by mouth daily.   Yes Historical Provider, MD  budesonide (PULMICORT) 0.5 MG/2ML nebulizer solution Take 0.5 mg by nebulization 2 (two) times daily.   Yes Historical Provider, MD  clonazePAM (KLONOPIN) 0.5 MG tablet Take 1 tablet (0.5 mg total) by mouth 2 (two) times daily. 08/23/16  Yes Katharina Caperima Vaickute, MD  clopidogrel (PLAVIX) 75 MG  tablet Take 75 mg by mouth daily.   Yes Historical Provider, MD  folic acid (FOLVITE) 1 MG tablet Take 1 mg by mouth daily.    Yes Historical Provider, MD  levothyroxine (SYNTHROID, LEVOTHROID) 75 MCG tablet Take 1 tablet (75 mcg total) by mouth daily before breakfast. 09/16/16  Yes Enid Baasadhika Kalisetti, MD  metoprolol tartrate (LOPRESSOR) 25 MG tablet Take 1 tablet (25 mg total) by mouth 2 (two) times daily. 07/27/16  Yes Katharina Caperima Vaickute, MD  Multiple Vitamin (MULTIVITAMIN WITH MINERALS) TABS tablet Take 1 tablet by mouth daily.   Yes Historical Provider, MD  oxyCODONE (OXY IR/ROXICODONE) 5 MG immediate release tablet Take 1 tablet (5 mg total) by mouth every 8 (eight) hours as needed for moderate pain. 09/16/16  Yes Enid Baasadhika Kalisetti, MD  OXYGEN 4 L/min by Intratracheal route as needed (for shortness of breath).    Yes Historical Provider, MD  predniSONE (DELTASONE) 10 MG tablet 40 mg po daily for 2 days.  20 mg po daily for 2 days.10 mg po daily for 3 days. 09/29/16  Yes Shaune Pollack, MD  QUEtiapine (SEROQUEL) 50 MG tablet Take 50 mg by mouth at bedtime.   Yes Historical Provider, MD  sertraline (ZOLOFT) 50 MG tablet Take 75 mg by mouth daily.    Yes Historical Provider, MD  SYMBICORT 160-4.5 MCG/ACT inhaler Inhale 2 puffs into the lungs 2 (two) times daily. 08/04/16  Yes Historical Provider, MD    Review of Systems  Constitutional: Negative for appetite change and fatigue.  HENT: Negative for congestion, postnasal drip and sore throat.   Eyes: Negative.   Respiratory: Negative for cough, chest tightness, shortness of breath and wheezing.   Cardiovascular: Negative for chest pain, palpitations and leg swelling.  Gastrointestinal: Negative for abdominal distention and abdominal pain.  Endocrine: Negative.   Genitourinary: Negative.   Musculoskeletal: Positive for neck pain and neck stiffness.  Skin: Negative.   Allergic/Immunologic: Negative.   Neurological: Negative for dizziness, syncope and  light-headedness.  Hematological: Negative for adenopathy. Bruises/bleeds easily.  Psychiatric/Behavioral: Negative for dysphoric mood, sleep disturbance (sleeping on 1 pillow with oxygen at 4L) and suicidal ideas. The patient is not nervous/anxious.    Vitals:   10/28/16 0943  BP: 125/62  Pulse: 78  Resp: 20  SpO2: 92%  Weight: 211 lb (95.7 kg)  Height: 5\' 8"  (1.727 m)   Wt Readings from Last 3 Encounters:  10/28/16 211 lb (95.7 kg)  10/28/16 211 lb (95.7 kg)  09/29/16 206 lb (93.4 kg)   Lab Results  Component Value Date   CREATININE 1.27 (H) 09/28/2016   CREATININE 1.35 (H) 09/27/2016   CREATININE 2.09 (H) 09/26/2016   Physical Exam  Constitutional: He is oriented to person, place, and time. He appears well-developed and well-nourished.  HENT:  Head: Normocephalic and atraumatic.  Neck: No JVD present. Decreased range of motion present.  Cardiovascular: Normal rate and regular rhythm.   Pulmonary/Chest: Effort normal. He has no wheezes. He has no rales.  Abdominal: Soft. He exhibits no distension. There is no tenderness.  Musculoskeletal: He exhibits no edema.  Neurological: He is alert and oriented to person, place, and time.  Skin: Skin is warm and dry.  Psychiatric: He has a normal mood and affect. His behavior is normal. Thought content normal.  Nursing note and vitals reviewed.   Assessment & Plan:  1: Chronic heart failure with preserved ejection fraction- - NYHA class I - euvolemic - weighing daily. Advised to call for an overnight weight gain of >2 pounds or a weekly weight gain of >5 pounds. Weight up 5 pounds since he was last here as he says that he's trying to eat better. - not adding salt and is trying to read food labels - currently not taking torsemide or lisinopril. No need to restart at this time  2: HTN- - BP looks good today - denies dizziness - saw PCP Chad Huffman) 10/18/16  3: COPD- - has oxygen at 4L that he wears around the clock when at  home. Going to discuss with provider about ordering a portable tank for when he's out - saw pulmonologist Sung Amabile) 10/28/16  4: Syncope-  - reports no more episodes of "passing out".   Return in 6 months or sooner for any questions/problems before then.

## 2016-10-29 ENCOUNTER — Encounter: Payer: Self-pay | Admitting: Family

## 2016-11-10 ENCOUNTER — Encounter: Payer: Self-pay | Admitting: Surgery

## 2016-11-22 ENCOUNTER — Ambulatory Visit (HOSPITAL_COMMUNITY)
Admission: RE | Admit: 2016-11-22 | Discharge: 2016-11-22 | Disposition: A | Discharge status: Home / Self Care | Payer: Medicare Other | Source: Ambulatory Visit | Attending: Surgery | Admitting: Surgery

## 2016-11-22 ENCOUNTER — Ambulatory Visit (INDEPENDENT_AMBULATORY_CARE_PROVIDER_SITE_OTHER): Payer: Medicare Other | Admitting: Surgery

## 2016-11-22 ENCOUNTER — Other Ambulatory Visit: Payer: Self-pay

## 2016-11-22 ENCOUNTER — Ambulatory Visit: Payer: Medicare Other | Admitting: Surgery

## 2016-11-22 ENCOUNTER — Encounter (HOSPITAL_COMMUNITY): Payer: Medicare Other

## 2016-11-22 ENCOUNTER — Encounter: Payer: Self-pay | Admitting: Surgery

## 2016-11-22 VITALS — BP 109/65 | HR 74 | Temp 97.6°F | Resp 20 | Ht 68.0 in | Wt 207.0 lb

## 2016-11-22 DIAGNOSIS — I6523 Occlusion and stenosis of bilateral carotid arteries: Secondary | ICD-10-CM | POA: Diagnosis not present

## 2016-11-22 DIAGNOSIS — Z4889 Encounter for other specified surgical aftercare: Secondary | ICD-10-CM

## 2016-11-22 DIAGNOSIS — I714 Abdominal aortic aneurysm, without rupture, unspecified: Secondary | ICD-10-CM

## 2016-11-22 NOTE — Progress Notes (Signed)
[facility] [place]  Patient name: Chad Huffman MRN: [id number] DOB: 1945/12/04 Sex: male   REASON FOR VISIT:    F/u AAA   HISOTRY OF PRESENT ILLNESS:    Chad Huffman is a 71 y.o. male returns today for follow-up.  He is status post endovascular repair of a 5.2 cm AAA in 2009.  He is without abdominal pain.  Recently, he was undergoing evaluation for carotid stenosis because he was having difficulty falling.  He ultimately underwent angiography in March 2018.  Which identified 60% right carotid stenosis, therefore no intervention was performed.  Previously he has undergone angiography to evaluate his left carotid artery which was 70-80 percent in-stent stenosis.  Anatomically, this would be a difficult stent, however with his neck fusion he is not candidate for surgical endarterectomy.  The patient's falling has been attributed to hypotension.  His medication has been adjusted.  Unfortunately, the patient fell about 3 weeks ago and he feels that he may have not something loose within his back and is scheduled to see a spine surgeon in the immediate future.  The patient is medically managed for hypertension.  He is a former smoker.   PAST MEDICAL HISTORY:   Past Medical History:  Diagnosis Date  . Antalgic gait   . Carotid artery occlusion   . Chronic neck and back pain   . Complication of anesthesia    pt has had cervical fusion-limited neck flextion  . COPD (chronic obstructive pulmonary disease) (HCC)   . Depression   . DJD (degenerative joint disease)    Right shoulder  . Hypertension   . Lumbago   . Peripheral neuropathy      FAMILY HISTORY:   Family History  Problem Relation Age of Onset  . Stomach cancer Mother   . Hypertension Brother   . Heart attack Father     SOCIAL HISTORY:   Social History  Substance Use Topics  . Smoking status: Former Smoker    Quit date: 08/30/2006  . Smokeless tobacco:  Never Used  . Alcohol use 1.2 oz/week    2 Cans of beer per week     Comment: occasional use     ALLERGIES:   Allergies  Allergen Reactions  . Ampicillin Swelling and Other (See Comments)    Unable to obtain enough information to answer additional questions about this medication.  Has patient had a PCN reaction causing immediate rash, facial/tongue/throat swelling, SOB or lightheadedness with hypotension: Yes Has patient had a PCN reaction causing severe rash involving mucus membranes or skin necrosis: No Has patient had a PCN reaction that required hospitalization No Has patient had a PCN reaction occurring within the last 10 years: Yes If all of the above answers are "NO", then may pr  . Nortriptyline Other (See Comments)    Sleep walking  . Brimonidine Other (See Comments)    Unknown reaction per MAR   . Cyclobenzaprine Other (See Comments)    Hallucinations  . Tizanidine Other (See Comments)    Hallucinations  . Gabapentin Rash     CURRENT MEDICATIONS:   Current Outpatient Prescriptions  Medication Sig Dispense Refill  . albuterol (ACCUNEB) 1.25 MG/3ML nebulizer solution Take 1 ampule by nebulization every 4 (four) hours as needed for wheezing.    . budesonide (PULMICORT) 0.5 MG/2ML nebulizer solution Take 0.5 mg by nebulization 2 (two) times daily.    . clonazePAM (KLONOPIN) 0.5 MG tablet Take 1 tablet (0.5 mg total) by  mouth 2 (two) times daily. 30 tablet 0  . folic acid (FOLVITE) 1 MG tablet Take 1 mg by mouth daily.     . levothyroxine (SYNTHROID, LEVOTHROID) 75 MCG tablMarland Kitchenet Take 1 tablet (75 mcg total) by mouth daily before breakfast. 30 tablet 0  . metoprolol tartrate (LOPRESSOR) 25 MG tablet Take 1 tablet (25 mg total) by mouth 2 (two) times daily. 60 tablet 6  . Multiple Vitamin (MULTIVITAMIN WITH MINERALS) TABS tablet Take 1 tablet by mouth daily.    Marland Kitchen. oxyCODONE (OXY IR/ROXICODONE) 5 MG immediate release tablet Take 1 tablet (5 mg total) by mouth every 8 (eight)  hours as needed for moderate pain. 18 tablet 0  . OXYGEN 4 L/min by Intratracheal route as needed (for shortness of breath).     . QUEtiapine (SEROQUEL) 50 MG tablet Take 50 mg by mouth at bedtime.    . sertraline (ZOLOFT) 50 MG tablet Take 75 mg by mouth daily.     . SYMBICORT 160-4.5 MCG/ACT inhaler Inhale 2 puffs into the lungs 2 (two) times daily.  3  . aspirin EC 81 MG tablet Take 81 mg by mouth daily.    . clopidogrel (PLAVIX) 75 MG tablet Take 75 mg by mouth daily.     No current facility-administered medications for this visit.     REVIEW OF SYSTEMS:   [X]  denotes positive finding, [ ]  denotes negative finding Cardiac  Comments:  Chest pain or chest pressure:    Shortness of breath upon exertion:    Short of breath when lying flat:    Irregular heart rhythm:        Vascular    Pain in calf, thigh, or hip brought on by ambulation:    Pain in feet at night that wakes you up from your sleep:     Blood clot in your veins:    Leg swelling:         Pulmonary    Oxygen at home:    Productive cough:     Wheezing:         Neurologic    Sudden weakness in arms or legs:     Sudden numbness in arms or legs:     Sudden onset of difficulty speaking or slurred speech:    Temporary loss of vision in one eye:     Problems with dizziness:         Gastrointestinal    Blood in stool:     Vomited blood:         Genitourinary    Burning when urinating:     Blood in urine:        Psychiatric    Major depression:         Hematologic    Bleeding problems:    Problems with blood clotting too easily:        Skin    Rashes or ulcers:        Constitutional    Fever or chills:      PHYSICAL EXAM:   Vitals:   11/22/16 1034  BP: 109/65  Pulse: 74  Resp: 20  Temp: 97.6 F (36.4 C)  TempSrc: Oral  SpO2: 94%  Weight: 207 lb (93.9 kg)  Height: 5\' 8"  (1.727 m)    GENERAL: The patient is a well-nourished male, in no acute distress. The vital signs are documented  above. CARDIAC: There is a regular rate and rhythm.  VASCULAR: No carotid bruits PULMONARY: Non-labored respirations ABDOMEN: Soft and  non-tender with normal pitched bowel sounds.  MUSCULOSKELETAL: There are no major deformities or cyanosis. NEUROLOGIC: No focal weakness or paresthesias are detected. SKIN: There are no ulcers or rashes noted. PSYCHIATRIC: The patient has a normal affect.  STUDIES:   Abdominal ultrasound was performed today which shows continued decrease in the maximum diameter of his aneurysm.  Today it measures 3.56 and maximum diameter.  Previously by some diameter is 4.01  MEDICAL ISSUES:   AAA:  Patient continues to show a decrease in the size of his aneurysm sac.  In an attempt to coordinate his carotid and aortic total per ultrasounds, I am scheduling his next AAA study for September 2019  Carotid stenosis: The patient is coming in September 2018 for carotid Doppler studies.  He remains asymptomatic.    Durene Cal, MD [facility] [phone] Pager [phone]

## 2016-11-23 ENCOUNTER — Other Ambulatory Visit: Payer: Self-pay | Admitting: *Deleted

## 2016-11-23 MED ORDER — BUDESONIDE 0.5 MG/2ML IN SUSP: 0.5000 mg | Freq: Two times a day (BID) | RESPIRATORY_TRACT | 5 refills | Status: DC

## 2016-11-23 MED ORDER — SYMBICORT 160-4.5 MCG/ACT IN AERO: 2.0000 | INHALATION_SPRAY | Freq: Two times a day (BID) | RESPIRATORY_TRACT | 5 refills | Status: DC

## 2016-12-15 IMAGING — CT CT RENAL STONE PROTOCOL
1 of 2 series · 15 of 32 positions shown, 19 images · non-contrast
Comparison: Thoracic spine CT 10/28/2014. Abdominal radiographs
10/23/2014 and earlier.

CLINICAL DATA: 69-year-old male who fell at home. Uses walker and
home oxygen. Initial encounter.

EXAM:
CT ABDOMEN AND PELVIS WITHOUT CONTRAST
TECHNIQUE: Multidetector CT imaging of the abdomen and pelvis was performed
following the standard protocol without IV contrast.

[Series 2: stone standard full · axial · 0.75mm/px · z∈[-1202,-697]mm · 15 of 111 slices shown, 19 images]
[im 5/111  soft-tissue]
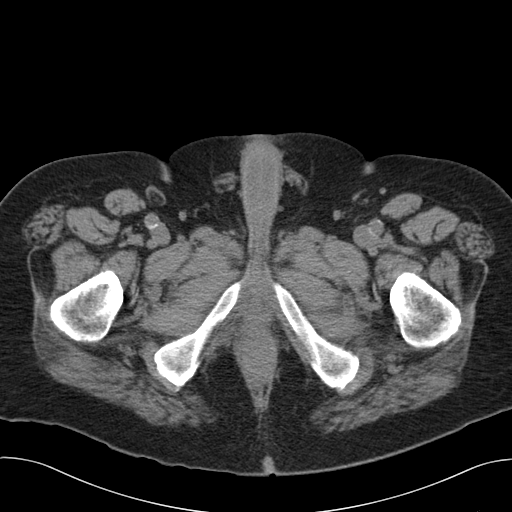
[im 5/111  bone]
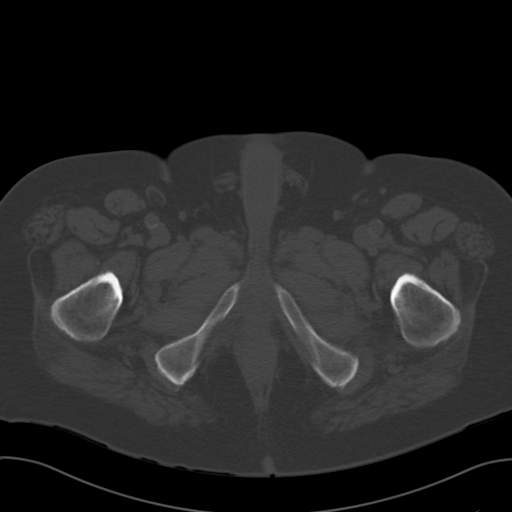
[im 15/111  soft-tissue]
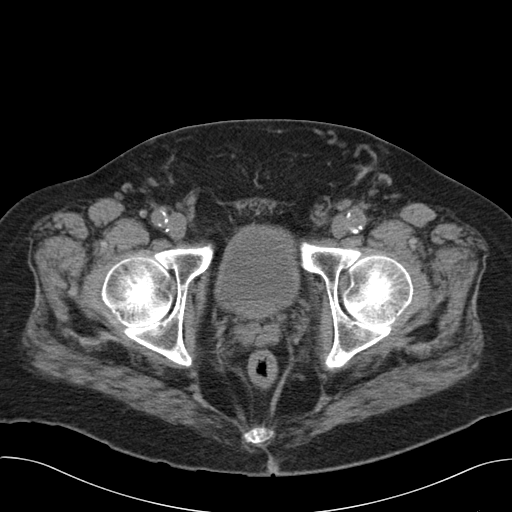
[im 24/111  soft-tissue]
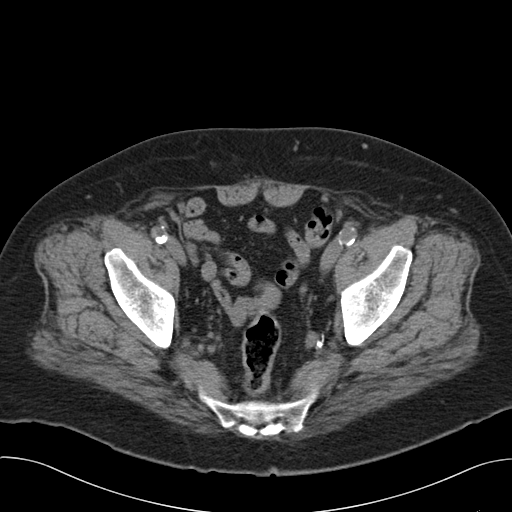
[im 29/111  soft-tissue]
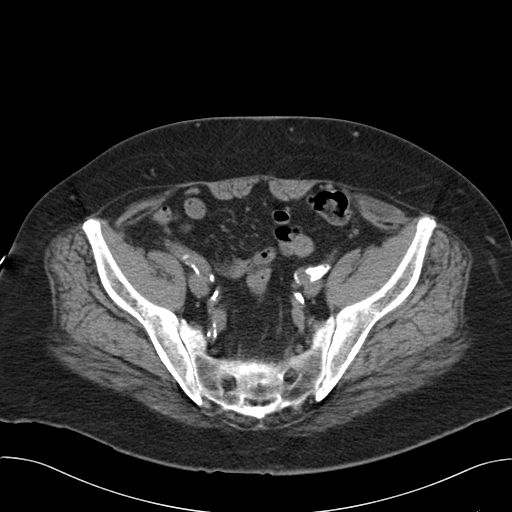
[im 39/111  soft-tissue]
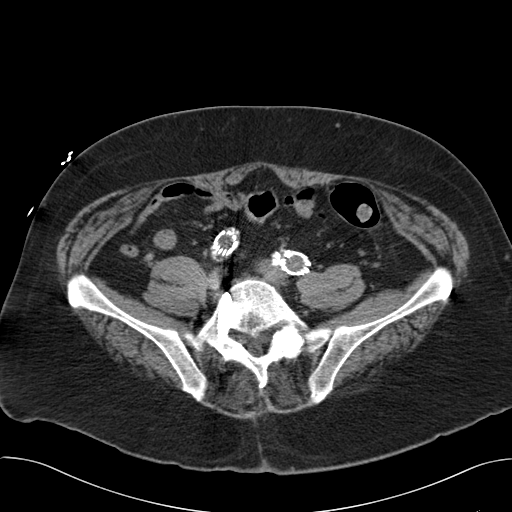
[im 48/111  soft-tissue]
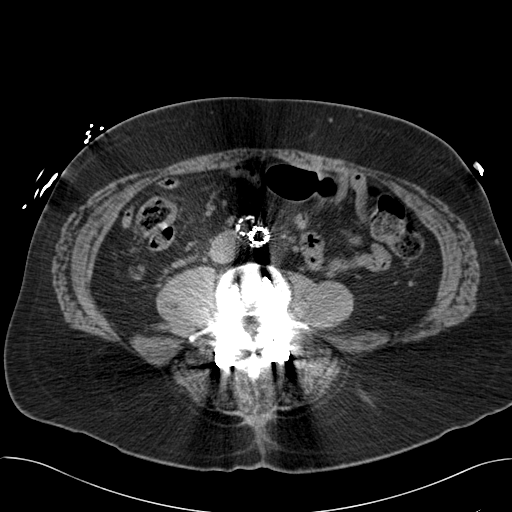
[im 58/111  soft-tissue]
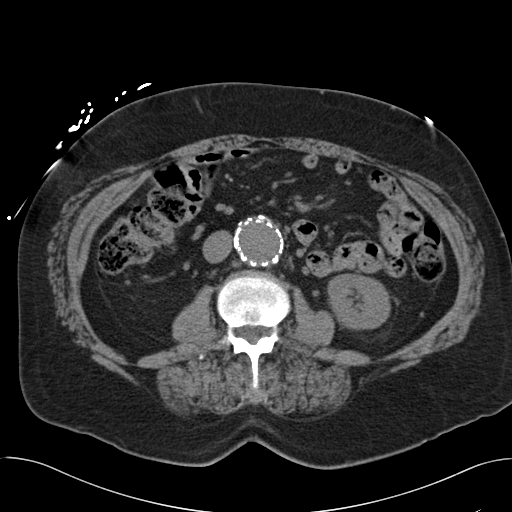
[im 63/111  soft-tissue]
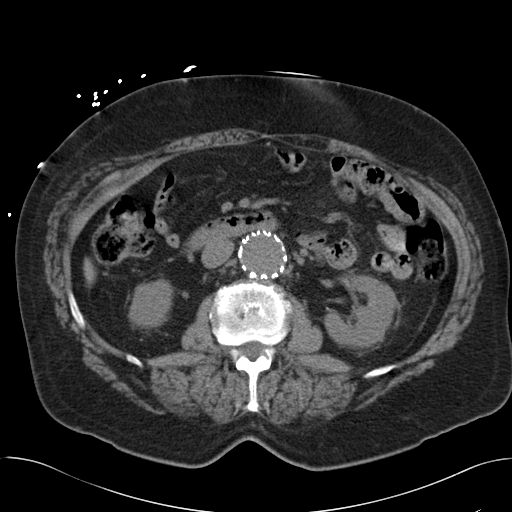
[im 72/111  soft-tissue]
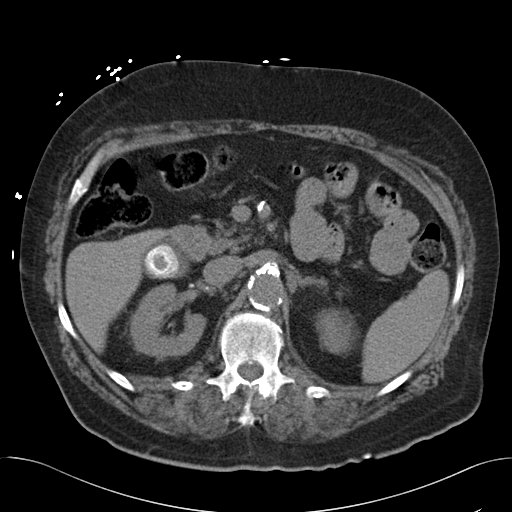
[im 72/111  bone]
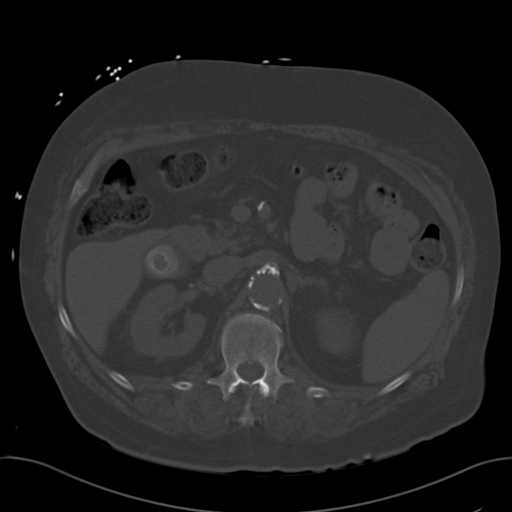
[im 82/111  soft-tissue]
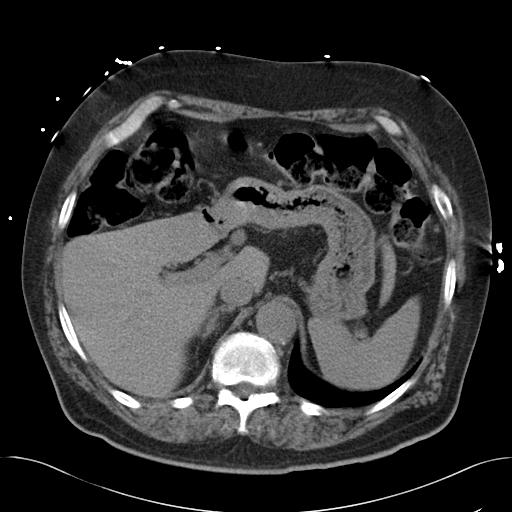
[im 87/111  soft-tissue]
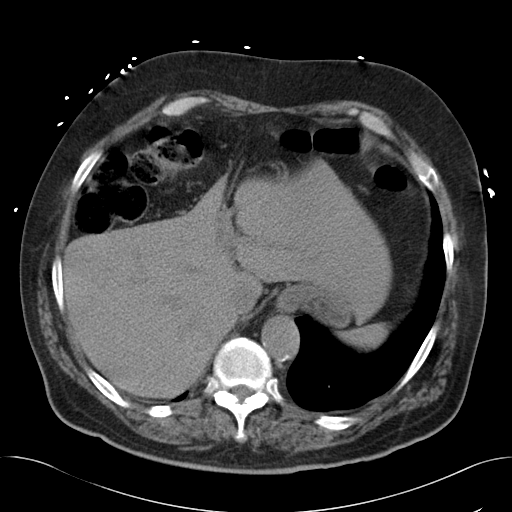
[im 91/111  lung]
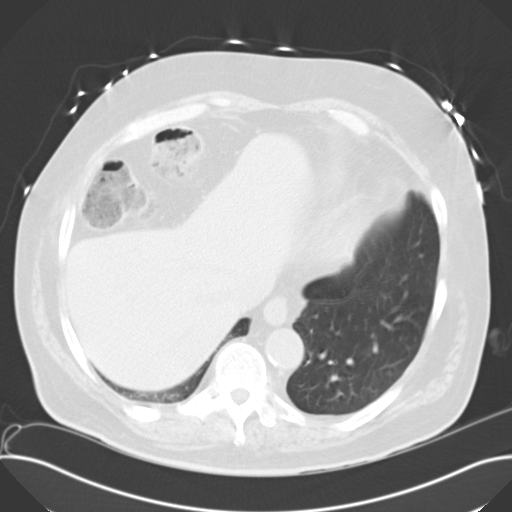
[im 96/111  soft-tissue]
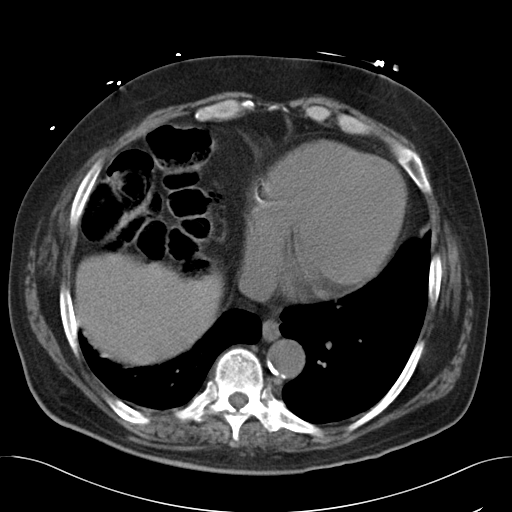
[im 96/111  lung]
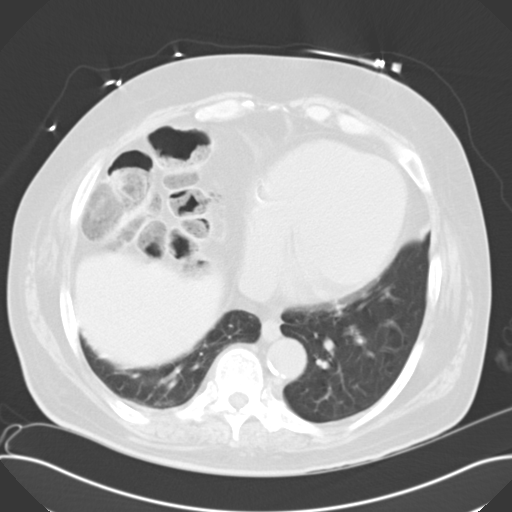
[im 101/111  lung]
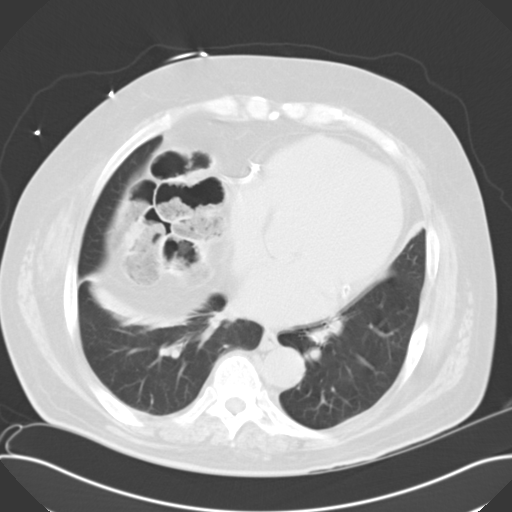
[im 106/111  soft-tissue]
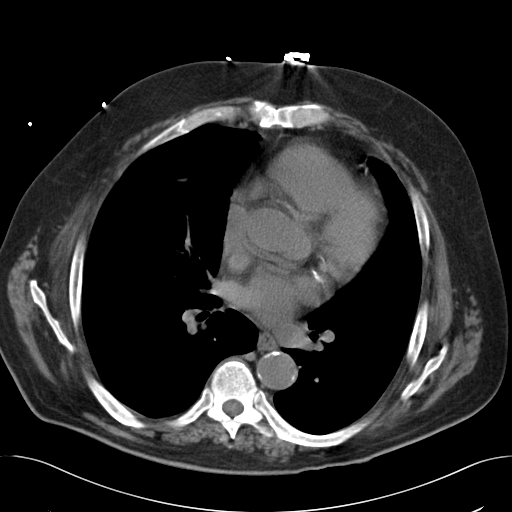
[im 106/111  lung]
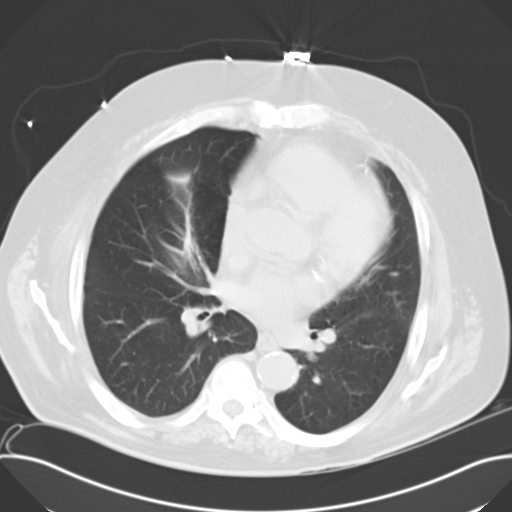

[15 of 32 positions shown; findings below may reference images not displayed]

FINDINGS: Calcified Coronary artery and aortic atherosclerosis. Mild
cardiomegaly. No pericardial or pleural effusion. Elevated right
hemidiaphragm with anterior basal segment right lower lobe
atelectasis or scarring.

Chronic lateral left lower rib fractures. Degenerative and
postoperative changes in the spine. No acute osseous abnormality
identified.

Juxta renal and distal abdominal aortic endograft. Bifurcated iliac
limbs. Extensive Aortoiliac calcified atherosclerosis noted. No
retroperitoneal fluid or stranding.

No pelvic free fluid. Diminutive urinary bladder. Negative rectum.
Redundant sigmoid colon with moderate diverticulosis, no active
inflammation. Lesser diverticula in the left colon with retained
stool. Redundant hepatic flexure. Retained stool in the transverse
colon and right colon. Negative appendix. No dilated small bowel.
Decompressed stomach and duodenum.

Negative noncontrast liver, spleen, pancreas and adrenal glands.
Cholelithiasis measuring up to 2.7 cm. No pericholecystic
inflammation. No hydronephrosis or nephrolithiasis. Negative
ureters. No abdominal free fluid. No lymphadenopathy.
IMPRESSION: 1.  No acute traumatic injury identified.
2. Calcified aortic and coronary artery atherosclerosis. Abdominal
aortic endograft.
3. Cholelithiasis.
4. Distal colon diverticulosis.

## 2016-12-20 ENCOUNTER — Other Ambulatory Visit: Payer: Self-pay

## 2016-12-20 NOTE — Telephone Encounter (Signed)
Pharmacist @ [facility] would not fill Pulmicort neb for patient. He was told that they don't fill in patients over age 575. Per last ov note patient is to be on DuoNeb. Please advise.

## 2016-12-20 NOTE — Telephone Encounter (Signed)
Pt needs auth from his insurance company states he needs this medication. Please call

## 2016-12-21 ENCOUNTER — Other Ambulatory Visit: Payer: Self-pay

## 2016-12-21 MED ORDER — BUDESONIDE 0.5 MG/2ML IN SUSP: 0.5000 mg | Freq: Two times a day (BID) | RESPIRATORY_TRACT | 5 refills | Status: DC

## 2016-12-21 MED ORDER — IPRATROPIUM-ALBUTEROL 0.5-2.5 (3) MG/3ML IN SOLN: 3.0000 mL | Freq: Four times a day (QID) | RESPIRATORY_TRACT | 5 refills | Status: AC | PRN

## 2016-12-21 NOTE — Telephone Encounter (Signed)
Called [facility] to d/c budesonide neb and make sure they have new order for DuoNeb. Spoke with pharmacist who verified order received.

## 2016-12-21 NOTE — Telephone Encounter (Signed)
My last note indicates that he is no longer on nebulized budesonide He should be on Symbicort and Spiriva with Duoneb as needed  Theodoro Gristave

## 2016-12-22 IMAGING — CR DG LUMBAR SPINE 2-3V
3 series · 3 of 3 positions shown · non-contrast
Comparison: Lumbar spine radiograph 08/04/2014.

CLINICAL DATA: 69-year-old male with history of low back pain since
this morning. History of fall 1 week ago.

EXAM:
LUMBAR SPINE - 2-3 VIEW

[l-spine ap]
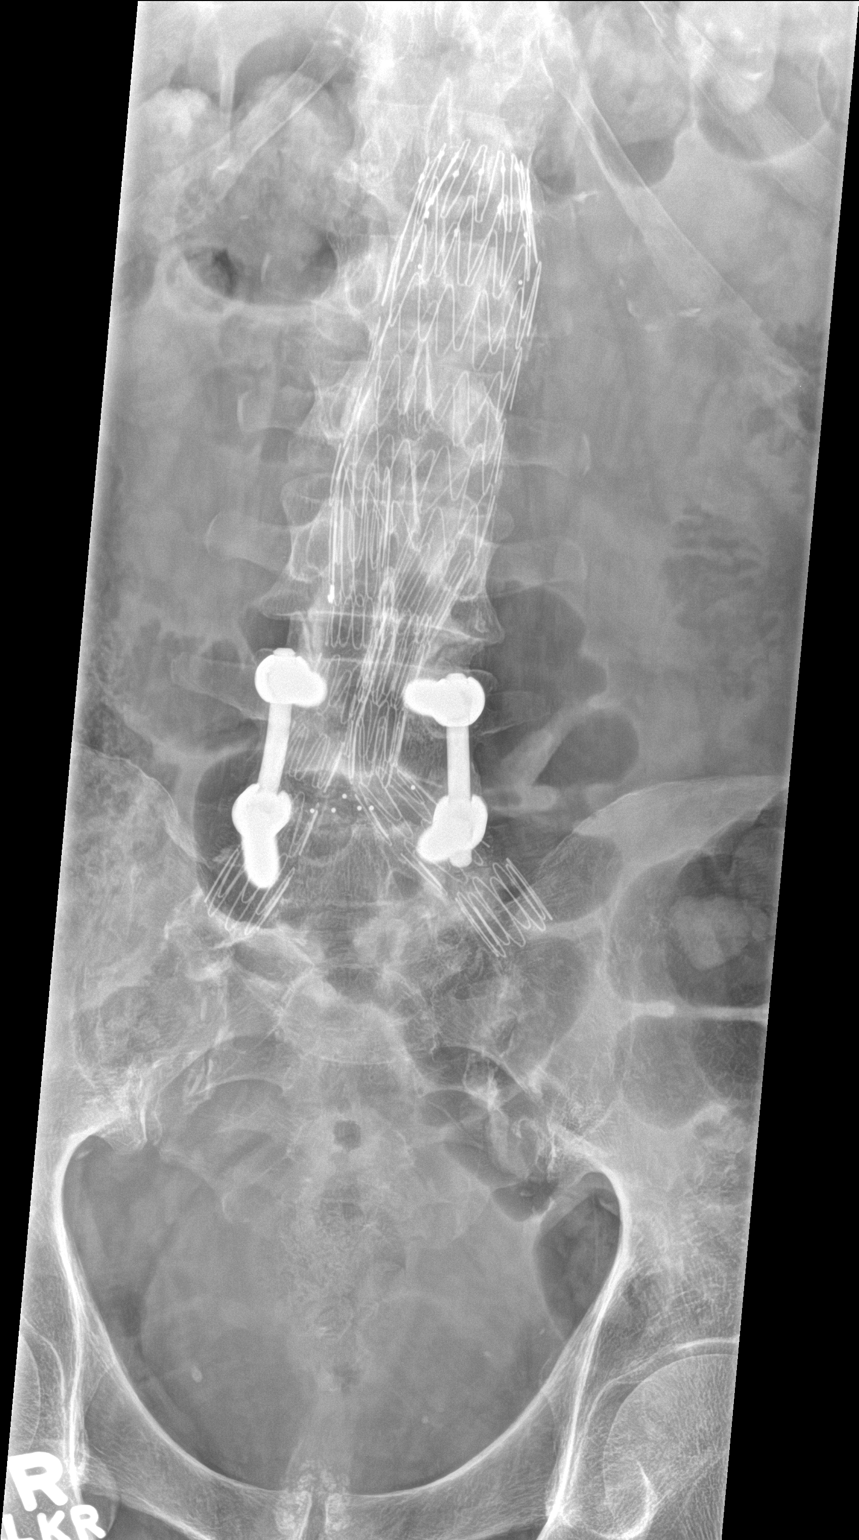

[l-spine lat]
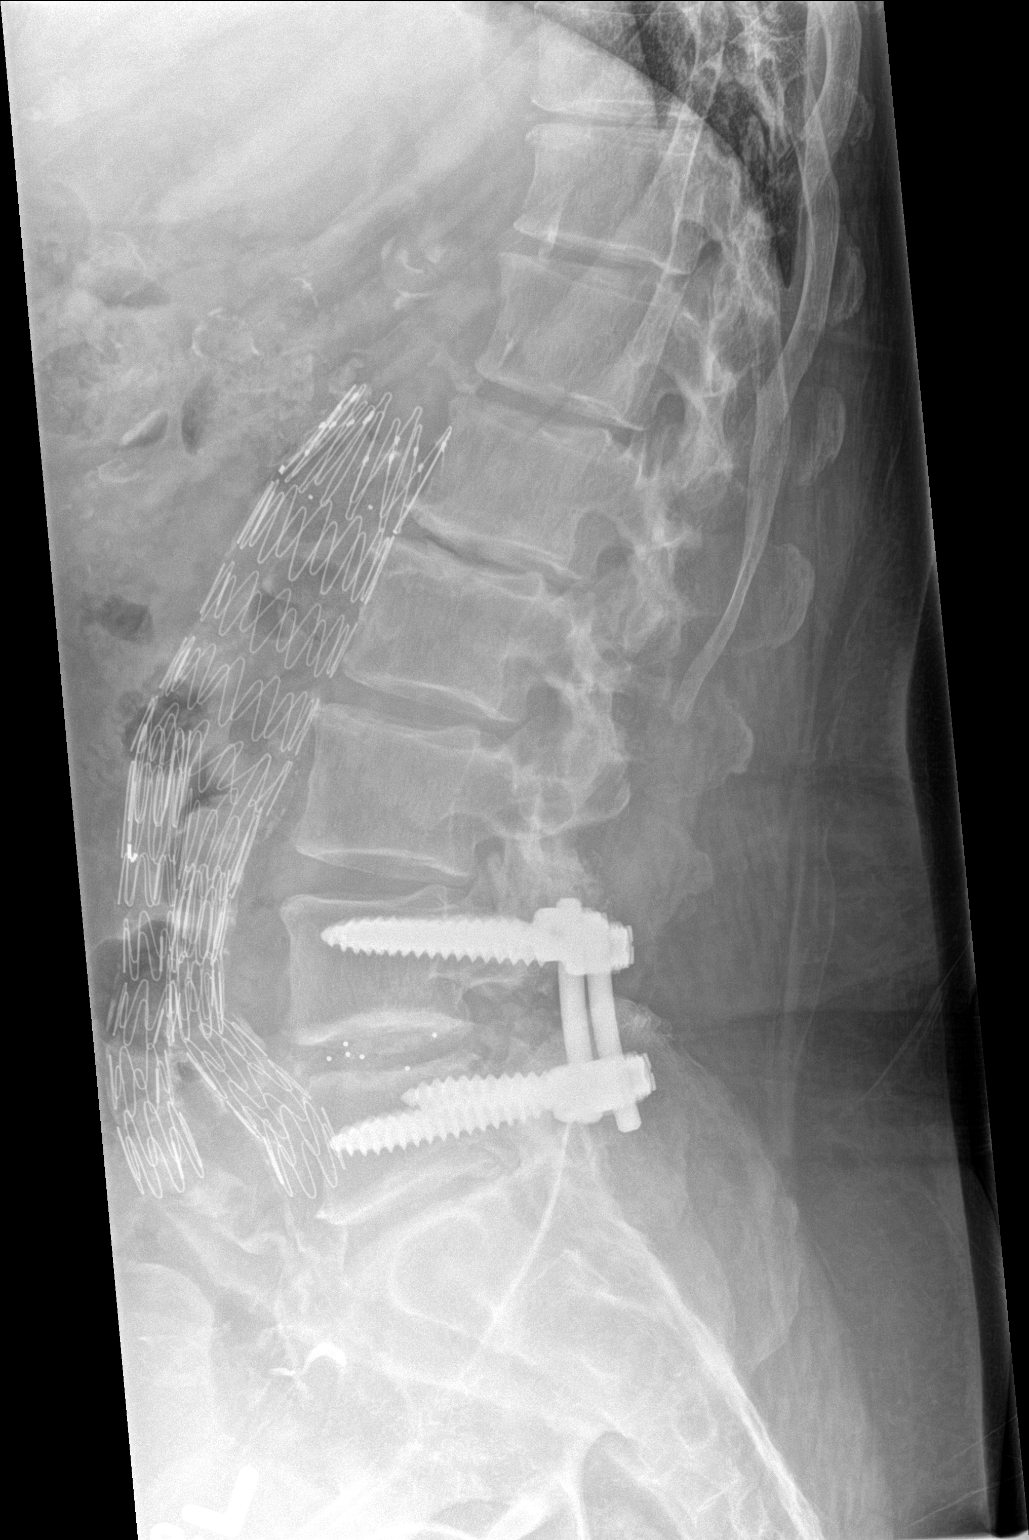

[l-spine spot]
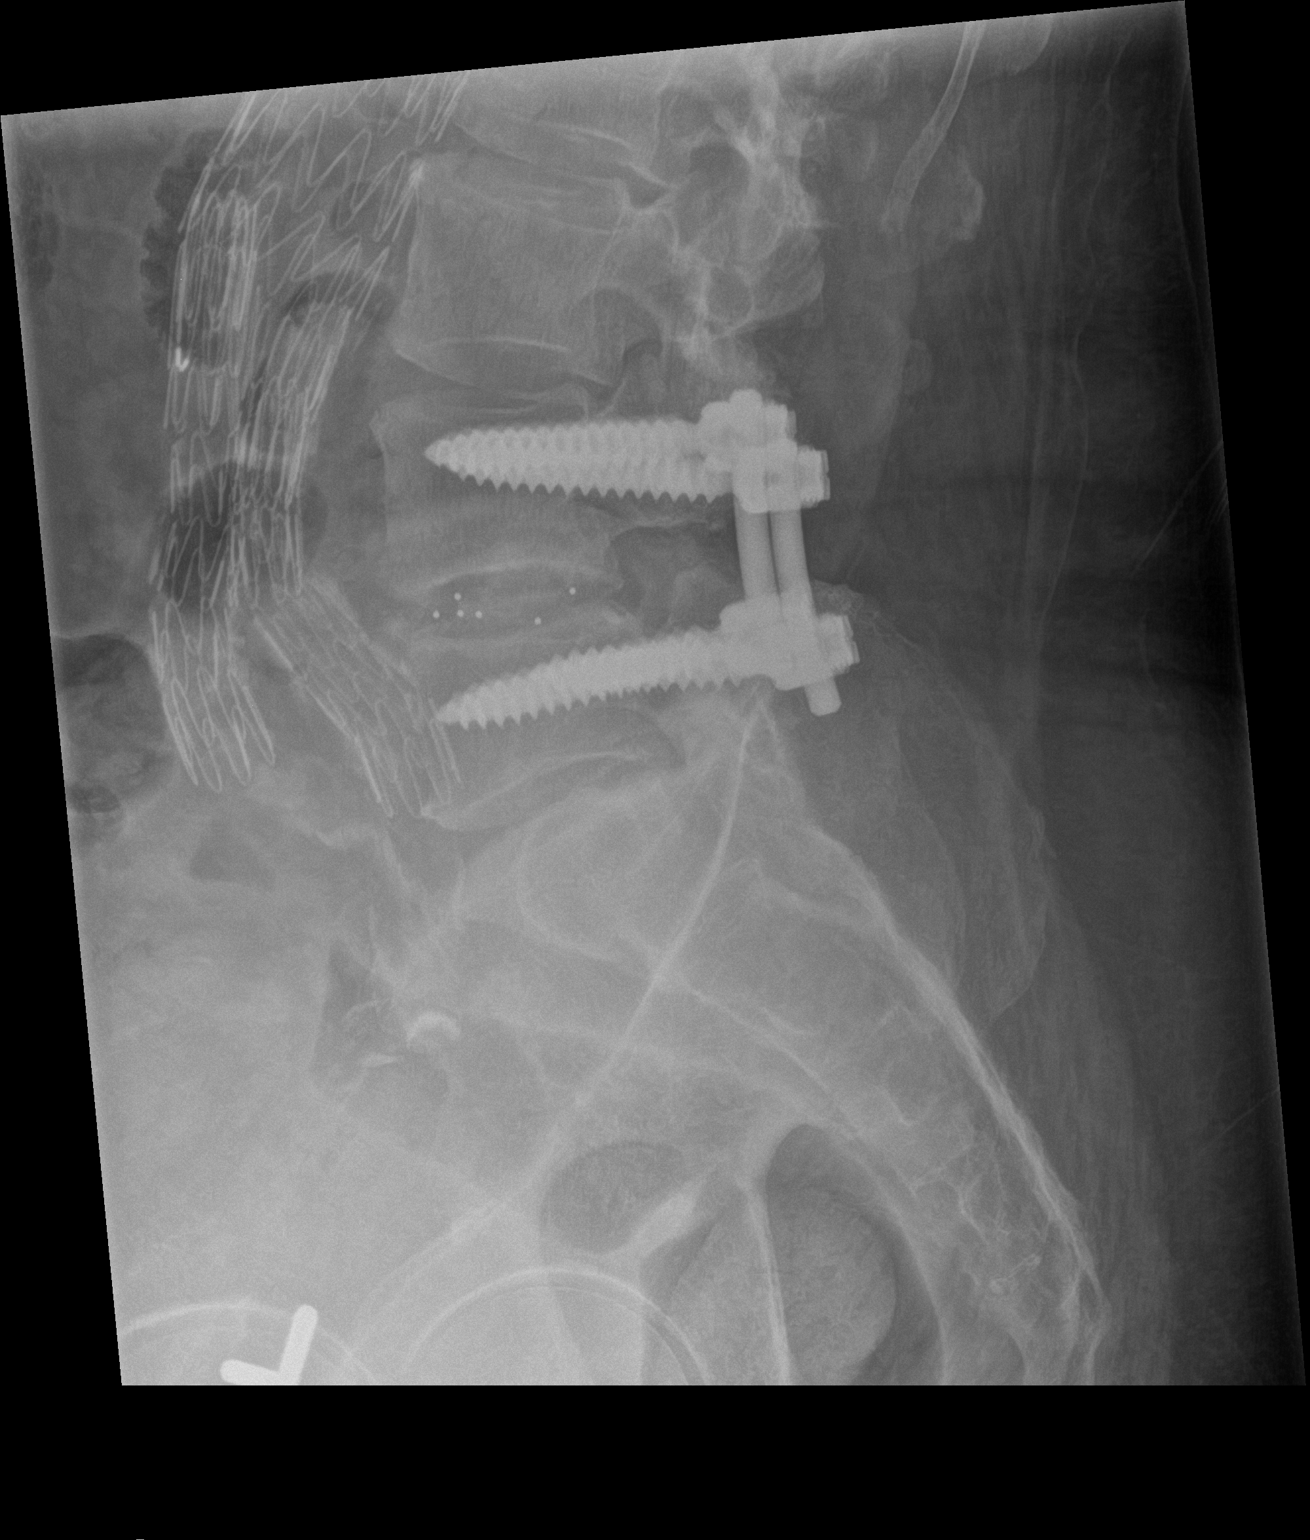

[3 of 3 positions shown; findings below may reference images not displayed]

FINDINGS: No acute displaced fracture or compression type fracture of the
lumbar spine. Multilevel degenerative disc disease, most severe at
L1-L2. Postoperative changes of PLIF are again noted at L4-L5, with
an interbody graft at L4-L5 interspace. No hardware related
complications. Alignment appears anatomic. Multilevel facet
arthropathy. Aortobi-iliac stent graft again noted.
IMPRESSION: 1. No acute radiographic abnormality of the lumbar spine.
2. Multilevel degenerative disc disease an lumbar spondylosis status
post PLIF at L4-L5, as above.

## 2017-01-14 ENCOUNTER — Emergency Department
Admission: EM | Admit: 2017-01-14 | Discharge: 2017-01-14 | Disposition: A | Discharge status: Home / Self Care | Payer: Medicare Other | Attending: Emergency Medicine | Admitting: Emergency Medicine

## 2017-01-14 ENCOUNTER — Encounter: Payer: Self-pay | Admitting: Emergency Medicine

## 2017-01-14 DIAGNOSIS — Z87891 Personal history of nicotine dependence: Secondary | ICD-10-CM | POA: Insufficient documentation

## 2017-01-14 DIAGNOSIS — Z7982 Long term (current) use of aspirin: Secondary | ICD-10-CM | POA: Diagnosis not present

## 2017-01-14 DIAGNOSIS — Y939 Activity, unspecified: Secondary | ICD-10-CM | POA: Diagnosis not present

## 2017-01-14 DIAGNOSIS — Z79899 Other long term (current) drug therapy: Secondary | ICD-10-CM | POA: Diagnosis not present

## 2017-01-14 DIAGNOSIS — S0181XA Laceration without foreign body of other part of head, initial encounter: Secondary | ICD-10-CM | POA: Diagnosis not present

## 2017-01-14 DIAGNOSIS — X58XXXA Exposure to other specified factors, initial encounter: Secondary | ICD-10-CM | POA: Insufficient documentation

## 2017-01-14 DIAGNOSIS — Z7902 Long term (current) use of antithrombotics/antiplatelets: Secondary | ICD-10-CM | POA: Insufficient documentation

## 2017-01-14 DIAGNOSIS — Y929 Unspecified place or not applicable: Secondary | ICD-10-CM | POA: Insufficient documentation

## 2017-01-14 DIAGNOSIS — S0993XA Unspecified injury of face, initial encounter: Secondary | ICD-10-CM | POA: Diagnosis present

## 2017-01-14 DIAGNOSIS — Y999 Unspecified external cause status: Secondary | ICD-10-CM | POA: Diagnosis not present

## 2017-01-14 MED ORDER — LIDOCAINE-EPINEPHRINE-TETRACAINE (LET) SOLUTION
3.0000 mL | Freq: Once | NASAL | Status: AC
  Administered 2017-01-14: 12:00:00 3 mL via TOPICAL
  Filled 2017-01-14: qty 3

## 2017-01-14 NOTE — ED Notes (Signed)
See triage note   States he scratched his right eye 2 days ago  conts to have some bleeding from inside corner of eye

## 2017-01-14 NOTE — ED Provider Notes (Signed)
[facility] Emergency Department Provider Note ____________________________________________  Time seen: 1101  I have reviewed the triage vital signs and the nursing notes.  HISTORY  Chief Complaint  Eye Injury  HPI Chad Huffman is a 71 y.o. male presents to the ED for evaluation and management of a small, self-inflicted scratch just to the nasal aspect of his right eye. He initially scratched himself 2 days prior. He has been unable to keep the area from oozing blood. He is on ASA and clopidogrel daily. He even called EMS today to the house, the applied a dressing, but he has bleed through that as well.   Past Medical History:  Diagnosis Date  . Antalgic gait   . Carotid artery occlusion   . Chronic neck and back pain   . Complication of anesthesia    pt has had cervical fusion-limited neck flextion  . COPD (chronic obstructive pulmonary disease) (HCC)   . Depression   . DJD (degenerative joint disease)    Right shoulder  . Hypertension   . Lumbago   . Peripheral neuropathy     Patient Active Problem List   Diagnosis Date Noted  . Hematemesis without nausea   . Pressure injury of skin 09/14/2016  . Weakness 09/13/2016  . UTI (urinary tract infection) 09/13/2016  . Chronic diastolic CHF (congestive heart failure) (HCC) 09/13/2016  . COPD (chronic obstructive pulmonary disease) (HCC) 09/13/2016  . Carotid stenosis 09/09/2016  . Carotid artery stenosis 08/23/2016  . Pancytopenia (HCC) 08/23/2016  . Hyperkalemia 08/23/2016  . Leukocytosis 07/27/2016  . Thrombocytopenia (HCC) 07/27/2016  . Anemia 07/27/2016  . 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 Dysphagia, pharyngoesophageal phase 05/18/2016  . GERD without esophagitis 05/09/2016  . Bilateral lower extremity edema 05/09/2016  . Slow transit constipation 05/09/2016  . Depression with anxiety 05/09/2016  . Respiratory failure with hypoxia and  hypercapnia (HCC) 07/22/2015  . Cervical vertebral fusion   . Depression   . Chronic neck and back pain   . COPD exacerbation (HCC)   . HCAP (healthcare-associated pneumonia)   . Congestive dilated cardiomyopathy (HCC)   . Syncope and collapse   . Arthritis of right hip 08/04/2014  . Essential hypertension 08/04/2014  . Hypothyroidism 08/04/2014  . Syncope 08/04/2014  . Fall 08/04/2014    Past Surgical History:  Procedure Laterality Date  . ABDOMINAL AORTIC ANEURYSM REPAIR  2009   Endovascular AAA repair  . ABDOMINAL AORTIC ANEURYSM REPAIR    . CAROTID ANGIOGRAPHY  09/09/2016   Procedure: Carotid Angiography;  Surgeon: Nada LibmanVance W Brabham, MD;  [facility];  Service: Cardiovascular;;  rt. carotid  . CARPAL TUNNEL RELEASE     bilateral CTS  . ESOPHAGOGASTRODUODENOSCOPY (EGD) WITH PROPOFOL N/A 09/28/2016   Procedure: ESOPHAGOGASTRODUODENOSCOPY (EGD) WITH PROPOFOL;  Surgeon: Midge Miniumarren Wohl, MD;  Location: [facility] ENDOSCOPY;  Service: Endoscopy;  Laterality: N/A;  . HAMMER TOE SURGERY  12/10/2011   Procedure: HAMMER TOE CORRECTION;  Surgeon: Ernestene KielMax Hyatt, DPM;  Location: [facility];  Service: Podiatry;  Laterality: Right;  Right hammertoe repair second right with 2.5x42 orthopro screw   . IR GENERIC HISTORICAL  04/05/2016   IR GASTROSTOMY TUBE MOD SED 04/05/2016 [facility]  . IR GENERIC HISTORICAL  05/12/2016   IR CM INJ ANY COLONIC TUBE W/FLUORO 05/12/2016 Malachy MoanHeath McCullough, MD [facility]  . IR GENERIC HISTORICAL  05/26/2016   IR CM INJ ANY COLONIC TUBE W/FLUORO 05/26/2016 [facility]  .  IR GENERIC HISTORICAL  05/28/2016   IR REPLC GASTRO/COLONIC TUBE PERCUT W/FLUORO 05/28/2016 Brayton El, PA-C [facility]  . IR GENERIC HISTORICAL  07/02/2016   IR GASTROSTOMY TUBE REMOVAL 07/02/2016 Brayton El, PA-C [facility]  . METATARSAL OSTEOTOMY  12/10/2011   Procedure: METATARSAL OSTEOTOMY;  Surgeon: Ernestene Kiel, DPM;  Location: [facility];  Service:  Podiatry;  Laterality: Right;  Right Keller arthroplasty with size 5 silicone implant right great toe; second metatarsal osteotomy with 2.0x14 screw  . NECK SURGERY    . PERIPHERAL VASCULAR CATHETERIZATION N/A 02/11/2015   Procedure: Carotid Angiography;  Surgeon: Nada Libman, MD;  Location: [facility] INVASIVE CV LAB;  Service: Cardiovascular;  Laterality: N/A;  . PILONIDAL CYST / SINUS EXCISION    . Rotary cuff    . SPINE SURGERY  09/15/11   Scar tissue removed- back  . SPINE SURGERY  09/15/11   Scar tissue removed- back  . TONSILLECTOMY    . TRACHEOSTOMY TUBE PLACEMENT N/A 03/03/2016   Procedure: TRACHEOSTOMY;  Surgeon: Geanie Logan, MD;  Location: [facility] ORS;  Service: ENT;  Laterality: N/A;    Prior to Admission medications   Medication Sig Start Date End Date Taking? Authorizing Provider  albuterol (ACCUNEB) 1.25 MG/3ML nebulizer solution Take 1 ampule by nebulization every 4 (four) hours as needed for wheezing.    [provider]  aspirin EC 81 MG tablet Take 81 mg by mouth daily.    [provider]  budesonide (PULMICORT) 0.5 MG/2ML nebulizer solution Take 2 mLs (0.5 mg total) by nebulization 2 (two) times daily. 12/21/16   Merwyn Katos, MD  clonazePAM (KLONOPIN) 0.5 MG tablet Take 1 tablet (0.5 mg total) by mouth 2 (two) times daily. 08/23/16   Katharina Caper, MD  clopidogrel (PLAVIX) 75 MG tablet Take 75 mg by mouth daily.    [provider]  folic acid (FOLVITE) 1 MG tablet Take 1 mg by mouth daily.     [provider]  ipratropium-albuterol (DUONEB) 0.5-2.5 (3) MG/3ML SOLN Take 3 mLs by nebulization every 6 (six) hours as needed. Dx:J44.9 12/21/16   Merwyn Katos, MD  levothyroxine (SYNTHROID, LEVOTHROID) 75 MCG tablet Take 1 tablet (75 mcg total) by mouth daily before breakfast. 09/16/16   Enid Baas, MD  metoprolol tartrate (LOPRESSOR) 25 MG tablet Take 1 tablet (25 mg total) by mouth 2 (two) times daily. 07/27/16   Katharina Caper, MD  Multiple  Vitamin (MULTIVITAMIN WITH MINERALS) TABS tablet Take 1 tablet by mouth daily.    [provider]  oxyCODONE (OXY IR/ROXICODONE) 5 MG immediate release tablet Take 1 tablet (5 mg total) by mouth every 8 (eight) hours as needed for moderate pain. 09/16/16   Enid Baas, MD  OXYGEN 4 L/min by Intratracheal route as needed (for shortness of breath).     [provider]  QUEtiapine (SEROQUEL) 50 MG tablet Take 50 mg by mouth at bedtime.    [provider]  sertraline (ZOLOFT) 50 MG tablet Take 75 mg by mouth daily.     [provider]  SYMBICORT 160-4.5 MCG/ACT inhaler Inhale 2 puffs into the lungs 2 (two) times daily. 11/23/16   Merwyn Katos, MD    Allergies Ampicillin; Nortriptyline; Brimonidine; Cyclobenzaprine; Tizanidine; and Gabapentin  Family History  Problem Relation Age of Onset  . Stomach cancer Mother   . Hypertension Brother   . Heart attack Father     Social History Social History  Substance Use Topics  . Smoking  status: Former Smoker    Quit date: 08/30/2006  . Smokeless tobacco: Never Used  . Alcohol use 1.2 oz/week    2 Cans of beer per week     Comment: occasional use    Review of Systems  Constitutional: Negative for fever. Eyes: Negative for visual changes. ENT: Negative for sore throat. Cardiovascular: Negative for chest pain. Respiratory: Negative for shortness of breath. Musculoskeletal: Negative for back pain. Skin: Negative for rash. Facial laceration as above.  ____________________________________________  PHYSICAL EXAM:  VITAL SIGNS: ED Triage Vitals  Enc Vitals Group     BP 01/14/17 1036 (!) 116/58     Pulse Rate 01/14/17 1036 (!) 58     Resp 01/14/17 1036 18     Temp 01/14/17 1036 98.6 F (37 C)     Temp Source 01/14/17 1036 Oral     SpO2 01/14/17 1036 90 %     Weight 01/14/17 1037 211 lb (95.7 kg)     Height 01/14/17 1037 5' 8.5" (1.74 m)     Head Circumference --      Peak Flow --      Pain  Score 01/14/17 1036 3     Pain Loc --      Pain Edu? --      Excl. in GC? --     Constitutional: Alert and oriented. Well appearing and in no distress. Head: Normocephalic and atraumatic. Eyes: Conjunctivae are normal. PERRL. Normal extraocular movements. Local ecchymosis and hematoma of right lower periorbital region noted.  Musculoskeletal: Nontender with normal range of motion in all extremities.  Neurologic:  Normal gait without ataxia. Normal speech and language. No gross focal neurologic deficits are appreciated. Skin:  Skin is warm, dry and intact. No rash noted. Patient with a superficial, linear laceration in a vertical lie, just lateral to the right eye at the nasal bridge. Constant, slow oozing of bright red blood.  ____________________________________________  PROCEDURES  LACERATION REPAIR Performed by: Lissa HoardMenshew, Rozetta Stumpp V Bacon Authorized by: Lissa HoardMenshew, Breea Loncar V Bacon Consent: Verbal consent obtained. Risks and benefits: risks, benefits and alternatives were discussed Consent given by: patient Patient identity confirmed: provided demographic data Prepped and Draped in normal sterile fashion Wound explored  Laceration Location: right periorbital   Laceration Length: 0.5 cm  No Foreign Bodies seen or palpated  Anesthesia: topical infiltration  Local anesthetic: lidocaine-epinephrine-tetracaine  Anesthetic total: 1 ml  Irrigation method: saline + gauze Amount of cleaning: standard  Skin closure: wound adhesive  Patient tolerance: Patient tolerated the procedure well with no immediate complications. ____________________________________________  INITIAL IMPRESSION / ASSESSMENT AND PLAN / ED COURSE  Patient on anticoagulation therapy with ED evaluation of a persistently bleeding, self-inflicted, superficial laceration. Hemostasis is obtained with topical LET. The small wound is closed using wound adhesive. The patient is discharged without further  incident. ____________________________________________  FINAL CLINICAL IMPRESSION(S) / ED DIAGNOSES  Final diagnoses:  Facial laceration, initial encounter      Lissa HoardMenshew, Kanoa Phillippi V Bacon, PA-C 01/14/17 1242    Emily FilbertWilliams, Jonathan E, MD 01/14/17 1311

## 2017-01-14 NOTE — ED Triage Notes (Signed)
Patient presents to the ED with bleeding from the skin near his right eye.  Patient states he thinks he accidentally scratched the area with his fingernail and area is continuing to bleed after patient has put pressure to area.  Patient states incident occurred 2 days ago.  Patient states area has been, "oozing" x 2 days.  Patient states, "I've changed the bandages on it about 6 times."  Patient reports taking plavix and aspirin.

## 2017-01-14 NOTE — Discharge Instructions (Signed)
We were able to stop the bleeding from your superficial scratch, using wound glue. Keep the area clear of any lotions, creams, or ointments.

## 2017-01-14 NOTE — ED Notes (Signed)
Family pulling car to door. This tech wheeled pt to car by wheelchair and assisted pt into car.

## 2017-02-07 ENCOUNTER — Other Ambulatory Visit: Payer: Self-pay | Admitting: Rehabilitation

## 2017-02-07 DIAGNOSIS — R292 Abnormal reflex: Secondary | ICD-10-CM

## 2017-02-07 DIAGNOSIS — R269 Unspecified abnormalities of gait and mobility: Secondary | ICD-10-CM

## 2017-02-19 ENCOUNTER — Other Ambulatory Visit: Payer: Medicare Other

## 2017-02-23 ENCOUNTER — Ambulatory Visit
Admission: RE | Admit: 2017-02-23 | Discharge: 2017-02-23 | Disposition: A | Discharge status: Home / Self Care | Payer: Medicare Other | Source: Ambulatory Visit | Attending: Rehabilitation | Admitting: Rehabilitation

## 2017-02-23 DIAGNOSIS — R269 Unspecified abnormalities of gait and mobility: Secondary | ICD-10-CM

## 2017-02-23 DIAGNOSIS — R292 Abnormal reflex: Secondary | ICD-10-CM

## 2017-02-23 MED ORDER — GADOBENATE DIMEGLUMINE 529 MG/ML IV SOLN
20.0000 mL | Freq: Once | INTRAVENOUS | Status: AC | PRN
  Administered 2017-02-23: 20 mL via INTRAVENOUS

## 2017-03-13 ENCOUNTER — Emergency Department: Payer: Medicare Other

## 2017-03-13 ENCOUNTER — Encounter: Payer: Self-pay | Admitting: Emergency Medicine

## 2017-03-13 ENCOUNTER — Emergency Department
Admission: EM | Admit: 2017-03-13 | Discharge: 2017-03-13 | Disposition: A | Discharge status: Home / Self Care | Payer: Medicare Other | Attending: Student in an Organized Health Care Education/Training Program | Admitting: Student in an Organized Health Care Education/Training Program

## 2017-03-13 DIAGNOSIS — Y999 Unspecified external cause status: Secondary | ICD-10-CM | POA: Insufficient documentation

## 2017-03-13 DIAGNOSIS — I5032 Chronic diastolic (congestive) heart failure: Secondary | ICD-10-CM | POA: Insufficient documentation

## 2017-03-13 DIAGNOSIS — Y92003 Bedroom of unspecified non-institutional (private) residence as the place of occurrence of the external cause: Secondary | ICD-10-CM | POA: Insufficient documentation

## 2017-03-13 DIAGNOSIS — J449 Chronic obstructive pulmonary disease, unspecified: Secondary | ICD-10-CM | POA: Diagnosis not present

## 2017-03-13 DIAGNOSIS — I110 Hypertensive heart disease with heart failure: Secondary | ICD-10-CM | POA: Diagnosis not present

## 2017-03-13 DIAGNOSIS — S63095A Other dislocation of left wrist and hand, initial encounter: Secondary | ICD-10-CM | POA: Diagnosis not present

## 2017-03-13 DIAGNOSIS — Z7982 Long term (current) use of aspirin: Secondary | ICD-10-CM | POA: Diagnosis not present

## 2017-03-13 DIAGNOSIS — X501XXA Overexertion from prolonged static or awkward postures, initial encounter: Secondary | ICD-10-CM | POA: Diagnosis not present

## 2017-03-13 DIAGNOSIS — Z7902 Long term (current) use of antithrombotics/antiplatelets: Secondary | ICD-10-CM | POA: Insufficient documentation

## 2017-03-13 DIAGNOSIS — E039 Hypothyroidism, unspecified: Secondary | ICD-10-CM | POA: Diagnosis not present

## 2017-03-13 DIAGNOSIS — Z79899 Other long term (current) drug therapy: Secondary | ICD-10-CM | POA: Diagnosis not present

## 2017-03-13 DIAGNOSIS — Z87891 Personal history of nicotine dependence: Secondary | ICD-10-CM | POA: Insufficient documentation

## 2017-03-13 DIAGNOSIS — S6992XA Unspecified injury of left wrist, hand and finger(s), initial encounter: Secondary | ICD-10-CM | POA: Diagnosis present

## 2017-03-13 DIAGNOSIS — Y939 Activity, unspecified: Secondary | ICD-10-CM | POA: Insufficient documentation

## 2017-03-13 LAB — CBC WITH DIFFERENTIAL/PLATELET
BASOS ABS: 0.1 10*3/uL (ref 0–0.1)
BASOS PCT: 1 %
EOS ABS: 0.2 10*3/uL (ref 0–0.7)
Eosinophils Relative: 3 %
HEMATOCRIT: 35 % — AB (ref 40.0–52.0)
HEMOGLOBIN: 11.6 g/dL — AB (ref 13.0–18.0)
Lymphocytes Relative: 14 %
Lymphs Abs: 0.8 10*3/uL — ABNORMAL LOW (ref 1.0–3.6)
MCH: 31.3 pg (ref 26.0–34.0)
MCHC: 33.2 g/dL (ref 32.0–36.0)
MCV: 94.4 fL (ref 80.0–100.0)
Monocytes Absolute: 0.7 10*3/uL (ref 0.2–1.0)
Monocytes Relative: 12 %
NEUTROS ABS: 3.9 10*3/uL (ref 1.4–6.5)
NEUTROS PCT: 70 %
Platelets: 129 10*3/uL — ABNORMAL LOW (ref 150–440)
RBC: 3.7 MIL/uL — AB (ref 4.40–5.90)
RDW: 15.7 % — ABNORMAL HIGH (ref 11.5–14.5)
WBC: 5.6 10*3/uL (ref 3.8–10.6)

## 2017-03-13 LAB — BASIC METABOLIC PANEL
Anion gap: 8 (ref 5–15)
BUN: 21 mg/dL — AB (ref 6–20)
CALCIUM: 9.1 mg/dL (ref 8.9–10.3)
CO2: 33 mmol/L — ABNORMAL HIGH (ref 22–32)
CREATININE: 1.08 mg/dL (ref 0.61–1.24)
Chloride: 92 mmol/L — ABNORMAL LOW (ref 101–111)
GFR calc Af Amer: >60 mL/min (ref >60)
Glucose, Bld: 94 mg/dL (ref 65–99)
Potassium: 4.3 mmol/L (ref 3.5–5.1)
SODIUM: 133 mmol/L — AB (ref 135–145)

## 2017-03-13 LAB — URIC ACID: URIC ACID, SERUM: 4.4 mg/dL (ref 4.4–7.6)

## 2017-03-13 MED ORDER — TRAMADOL HCL 50 MG PO TABS: 50.0000 mg | ORAL_TABLET | Freq: Four times a day (QID) | ORAL | 0 refills | Status: DC | PRN

## 2017-03-13 NOTE — ED Notes (Signed)
NAD noted at time of D/C. Pt denies questions or concerns. Pt ambulatory to the lobby at this time with his family member.

## 2017-03-13 NOTE — ED Triage Notes (Signed)
Pt reports some left hand pain yesterday.  Today woke up with swelling and worse pain.  Slightly red and warm to touch.  Mild swelling noted to left hand/wrist.  No injury. No fevers.

## 2017-03-13 NOTE — ED Notes (Signed)
MD aware of patient's BP at time of D/C. States okay for D/C.

## 2017-03-13 NOTE — ED Notes (Signed)
Called lab to add on uric acid.

## 2017-03-13 NOTE — ED Provider Notes (Signed)
[facility] Emergency Department Provider Note    First MD Initiated Contact with Patient 03/13/17 1534     (approximate)  I have reviewed the triage vital signs and the nursing notes.   HISTORY  Chief Complaint Other (hand swelling)    HPI Chad Huffman is a 71 y.o. male presents with chief complaint of left hand pain that became worse yesterday. Patient states that he noted swelling and deformity roughly 2 weeks ago after a fall. States that he thinks that he slept on it wrong 2 days ago. Had not seen any physician for the swelling is in 2 weeks ago after the fall. Denies any numbness or tingling. States the pain has gotten better today but was more severe yesterday. Eyes any other injuries or pain.  No history of gout.  No fevers   Past Medical History:  Diagnosis Date  . Antalgic gait   . Carotid artery occlusion   . Chronic neck and back pain   . Complication of anesthesia    pt has had cervical fusion-limited neck flextion  . COPD (chronic obstructive pulmonary disease) (HCC)   . Depression   . DJD (degenerative joint disease)    Right shoulder  . Hypertension   . Lumbago   . Peripheral neuropathy    Family History  Problem Relation Age of Onset  . Stomach cancer Mother   . Hypertension Brother   . Heart attack Father    Past Surgical History:  Procedure Laterality Date  . ABDOMINAL AORTIC ANEURYSM REPAIR  2009   Endovascular AAA repair  . ABDOMINAL AORTIC ANEURYSM REPAIR    . CAROTID ANGIOGRAPHY  09/09/2016   Procedure: Carotid Angiography;  Surgeon: Nada Libman, MD;  Location: [facility] INVASIVE CV LAB;  Service: Cardiovascular;;  rt. carotid  . CARPAL TUNNEL RELEASE     bilateral CTS  . ESOPHAGOGASTRODUODENOSCOPY (EGD) WITH PROPOFOL N/A 09/28/2016   Procedure: ESOPHAGOGASTRODUODENOSCOPY (EGD) WITH PROPOFOL;  Surgeon: Midge Minium, MD;  Location: [facility] ENDOSCOPY;  Service: Endoscopy;  Laterality: N/A;  . HAMMER TOE SURGERY  12/10/2011   Procedure: HAMMER TOE CORRECTION;  Surgeon: Ernestene Kiel, DPM;  Location: [facility];  Service: Podiatry;  Laterality: Right;  Right hammertoe repair second right with 2.5x42 orthopro screw   . IR GENERIC HISTORICAL  04/05/2016   IR GASTROSTOMY TUBE MOD SED 04/05/2016 [facility]  . IR GENERIC HISTORICAL  05/12/2016   IR CM INJ ANY COLONIC TUBE W/FLUORO 05/12/2016 Malachy Moan, MD [facility]  . IR GENERIC HISTORICAL  05/26/2016   IR CM INJ ANY COLONIC TUBE W/FLUORO 05/26/2016 [facility]  . IR GENERIC HISTORICAL  05/28/2016   IR REPLC GASTRO/COLONIC TUBE PERCUT W/FLUORO 05/28/2016 Brayton El, PA-C [facility]  . IR GENERIC HISTORICAL  07/02/2016   IR GASTROSTOMY TUBE REMOVAL 07/02/2016 Brayton El, PA-C [facility]  . METATARSAL OSTEOTOMY  12/10/2011   Procedure: METATARSAL OSTEOTOMY;  Surgeon: Ernestene Kiel, DPM;  Location: [facility];  Service: Podiatry;  Laterality: Right;  Right Keller arthroplasty with size 5 silicone implant right great toe; second metatarsal osteotomy with 2.0x14 screw  . NECK SURGERY    . PERIPHERAL VASCULAR CATHETERIZATION N/A 02/11/2015   Procedure: Carotid Angiography;  Surgeon: Nada Libman, MD;  Location: [facility] INVASIVE CV LAB;  Service: Cardiovascular;  Laterality: N/A;  . PILONIDAL CYST / SINUS EXCISION    . Rotary cuff    . SPINE SURGERY  09/15/11   Scar tissue removed- back  .  SPINE SURGERY  09/15/11   Scar tissue removed- back  . TONSILLECTOMY    . TRACHEOSTOMY TUBE PLACEMENT N/A 03/03/2016   Procedure: TRACHEOSTOMY;  Surgeon: Geanie Logan, MD;  Location: [facility] ORS;  Service: ENT;  Laterality: N/A;   Patient Active Problem List   Diagnosis Date Noted  . Hematemesis without nausea   . Pressure injury of skin 09/14/2016  . Weakness 09/13/2016  . UTI (urinary tract infection) 09/13/2016  . Chronic diastolic CHF (congestive heart failure) (HCC) 09/13/2016  . COPD (chronic obstructive pulmonary disease) (HCC) 09/13/2016    . Carotid stenosis 09/09/2016  . Carotid artery stenosis 08/23/2016  . Pancytopenia (HCC) 08/23/2016  . Hyperkalemia 08/23/2016  . Leukocytosis 07/27/2016  . Thrombocytopenia (HCC) 07/27/2016  . Anemia 07/27/2016  . 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 Dysphagia, pharyngoesophageal phase 05/18/2016  . GERD without esophagitis 05/09/2016  . Bilateral lower extremity edema 05/09/2016  . Slow transit constipation 05/09/2016  . Depression with anxiety 05/09/2016  . Respiratory failure with hypoxia and hypercapnia (HCC) 07/22/2015  . Cervical vertebral fusion   . Depression   . Chronic neck and back pain   . COPD exacerbation (HCC)   . HCAP (healthcare-associated pneumonia)   . Congestive dilated cardiomyopathy (HCC)   . Syncope and collapse   . Arthritis of right hip 08/04/2014  . Essential hypertension 08/04/2014  . Hypothyroidism 08/04/2014  . Syncope 08/04/2014  . Fall 08/04/2014      Prior to Admission medications   Medication Sig Start Date End Date Taking? Authorizing Provider  albuterol (ACCUNEB) 1.25 MG/3ML nebulizer solution Take 1 ampule by nebulization every 4 (four) hours as needed for wheezing.    [provider]  aspirin EC 81 MG tablet Take 81 mg by mouth daily.    [provider]  budesonide (PULMICORT) 0.5 MG/2ML nebulizer solution Take 2 mLs (0.5 mg total) by nebulization 2 (two) times daily. 12/21/16   Merwyn Katos, MD  clonazePAM (KLONOPIN) 0.5 MG tablet Take 1 tablet (0.5 mg total) by mouth 2 (two) times daily. 08/23/16   Katharina Caper, MD  clopidogrel (PLAVIX) 75 MG tablet Take 75 mg by mouth daily.    [provider]  folic acid (FOLVITE) 1 MG tablet Take 1 mg by mouth daily.     [provider]  ipratropium-albuterol (DUONEB) 0.5-2.5 (3) MG/3ML SOLN Take 3 mLs by nebulization every 6 (six) hours as needed. Dx:J44.9 12/21/16   Merwyn Katos, MD  levothyroxine  (SYNTHROID, LEVOTHROID) 75 MCG tablet Take 1 tablet (75 mcg total) by mouth daily before breakfast. 09/16/16   Enid Baas, MD  metoprolol tartrate (LOPRESSOR) 25 MG tablet Take 1 tablet (25 mg total) by mouth 2 (two) times daily. 07/27/16   Katharina Caper, MD  Multiple Vitamin (MULTIVITAMIN WITH MINERALS) TABS tablet Take 1 tablet by mouth daily.    [provider]  oxyCODONE (OXY IR/ROXICODONE) 5 MG immediate release tablet Take 1 tablet (5 mg total) by mouth every 8 (eight) hours as needed for moderate pain. 09/16/16   Enid Baas, MD  OXYGEN 4 L/min by Intratracheal route as needed (for shortness of breath).     [provider]  QUEtiapine (SEROQUEL) 50 MG tablet Take 50 mg by mouth at bedtime.    [provider]  sertraline (ZOLOFT) 50 MG tablet Take 75 mg by mouth daily.     [provider]  SYMBICORT 160-4.5 MCG/ACT inhaler Inhale 2 puffs into the lungs  2 (two) times daily. 11/23/16   Merwyn Katos, MD    Allergies Ampicillin; Nortriptyline; Brimonidine; Cyclobenzaprine; Tizanidine; and Gabapentin    Social History Social History  Substance Use Topics  . Smoking status: Former Smoker    Quit date: 08/30/2006  . Smokeless tobacco: Never Used  . Alcohol use 1.2 oz/week    2 Cans of beer per week     Comment: occasional use    Review of Systems Patient denies headaches, rhinorrhea, blurry vision, numbness, shortness of breath, chest pain, edema, cough, abdominal pain, nausea, vomiting, diarrhea, dysuria, fevers, rashes or hallucinations unless otherwise stated above in HPI. ____________________________________________   PHYSICAL EXAM:  VITAL SIGNS: Vitals:   03/13/17 1302  BP: (!) 118/48  Pulse: (!) 57  Resp: 20  Temp: 98 F (36.7 C)  SpO2: 93%    Constitutional: Alert and oriented. in no acute distress. Eyes: Conjunctivae are normal.  Head: Atraumatic. Nose: No congestion/rhinnorhea. Mouth/Throat: Mucous membranes are  moist.   Neck: No stridor. Painless ROM.  Cardiovascular: Normal rate, regular rhythm. Grossly normal heart sounds.  Good peripheral circulation. Respiratory: Normal respiratory effort.  No retractions. Lungs CTAB. Gastrointestinal: Soft and nontender. No distention. No abdominal bruits. No CVA tenderness. Genitourinary Musculoskeletal: No lower extremity tenderness nor edema.  Swelling and dorsal deformity to left wrist.  No effusion, no erythema.  Decreased ROM 2/2 pain.  SILT distally.  Cap refill 3 seconds.  2+ radial and ulnar pulses Neurologic:  Normal speech and language. No gross focal neurologic deficits are appreciated. No facial droop Skin:  Skin is warm, dry and intact. No rash noted. Psychiatric: Mood and affect are normal. Speech and behavior are normal.  ____________________________________________   LABS (all labs ordered are listed, but only abnormal results are displayed)  Results for orders placed or performed during the hospital encounter of 03/13/17 (from the past 24 hour(s))  CBC with Differential     Status: Abnormal   Collection Time: 03/13/17  1:10 PM  Result Value Ref Range   WBC 5.6 3.8 - 10.6 K/uL   RBC 3.70 (L) 4.40 - 5.90 MIL/uL   Hemoglobin 11.6 (L) 13.0 - 18.0 g/dL   HCT 11.9 (L) 14.7 - 82.9 %   MCV 94.4 80.0 - 100.0 fL   MCH 31.3 26.0 - 34.0 pg   MCHC 33.2 32.0 - 36.0 g/dL   RDW 56.2 (H) 13.0 - 86.5 %   Platelets 129 (L) 150 - 440 K/uL   Neutrophils Relative % 70 %   Neutro Abs 3.9 1.4 - 6.5 K/uL   Lymphocytes Relative 14 %   Lymphs Abs 0.8 (L) 1.0 - 3.6 K/uL   Monocytes Relative 12 %   Monocytes Absolute 0.7 0.2 - 1.0 K/uL   Eosinophils Relative 3 %   Eosinophils Absolute 0.2 0 - 0.7 K/uL   Basophils Relative 1 %   Basophils Absolute 0.1 0 - 0.1 K/uL  Basic metabolic panel     Status: Abnormal   Collection Time: 03/13/17  1:10 PM  Result Value Ref Range   Sodium 133 (L) 135 - 145 mmol/L   Potassium 4.3 3.5 - 5.1 mmol/L   Chloride 92 (L)  101 - 111 mmol/L   CO2 33 (H) 22 - 32 mmol/L   Glucose, Bld 94 65 - 99 mg/dL   BUN 21 (H) 6 - 20 mg/dL   Creatinine, Ser 7.84 0.61 - 1.24 mg/dL   Calcium 9.1 8.9 - 69.6 mg/dL   GFR calc non Af  Amer >60 >60 mL/min   GFR calc Af Amer >60 >60 mL/min   Anion gap 8 5 - 15   ____________________________________________  ____________________________________________  RADIOLOGY  I personally reviewed all radiographic images ordered to evaluate for the above acute complaints and reviewed radiology reports and findings.  These findings were personally discussed with the patient.  Please see medical record for radiology report.  ____________________________________________   PROCEDURES  Procedure(s) performed:  Procedures    Critical Care performed: no ____________________________________________   INITIAL IMPRESSION / ASSESSMENT AND PLAN / ED COURSE  Pertinent labs & imaging results that were available during my care of the patient were reviewed by me and considered in my medical decision making (see chart for details).  DDX: gout, sprain, fracture, dislocation  NIK GORRELL is a 71 y.o. who presents to the ED with swelling and wrist pain as described above. Since that injury occurred likely 2-3 weeks ago the pain became more severe yesterday. Patient afebrile. No history of gout. Uric acid checked which was normal. He has no leukocytosis. This is not clinically consistent with septic arthritis. X-ray ordered for the above differential shows evidence of lunate/perilunate dislocation. No neuro or vascular deficits at this time. I spoke with Dr. Odis Luster of orthopedics regarding my findings. Plan will be for patient to follow-up in clinic tomorrow for operative fixation.  Have discussed with the patient and available family all diagnostics and treatments performed thus far and all questions were answered to the best of my ability. The patient demonstrates understanding and agreement with  plan.       ____________________________________________   FINAL CLINICAL IMPRESSION(S) / ED DIAGNOSES  Final diagnoses:  Closed perilunate dislocation of left wrist, initial encounter      NEW MEDICATIONS STARTED DURING THIS VISIT:  New Prescriptions   No medications on file     Note:  This document was prepared using Dragon voice recognition software and may include unintentional dictation errors.    Willy Eddy, MD 03/13/17 [phone]

## 2017-03-13 NOTE — Discharge Instructions (Signed)
Follow up in ortho clinic tomorrow AM.

## 2017-03-14 ENCOUNTER — Ambulatory Visit (HOSPITAL_COMMUNITY)
Admission: RE | Admit: 2017-03-14 | Discharge: 2017-03-14 | Disposition: A | Discharge status: Home / Self Care | Payer: Medicare Other | Source: Ambulatory Visit | Attending: Family | Admitting: Family

## 2017-03-14 ENCOUNTER — Encounter: Payer: Medicare Other | Admitting: Family

## 2017-03-14 DIAGNOSIS — I6523 Occlusion and stenosis of bilateral carotid arteries: Secondary | ICD-10-CM | POA: Diagnosis present

## 2017-03-14 NOTE — Progress Notes (Signed)
Pt left after carotid duplex and before I could see him.

## 2017-03-14 NOTE — Patient Instructions (Signed)
Stroke Prevention Some medical conditions and behaviors are associated with an increased chance of having a stroke. You may prevent a stroke by making healthy choices and managing medical conditions. How can I reduce my risk of having a stroke?  Stay physically active. Get at least 30 minutes of activity on most or all days.  Do not smoke. It may also be helpful to avoid exposure to secondhand smoke.  Limit alcohol use. Moderate alcohol use is considered to be: ? No more than 2 drinks per day for men. ? No more than 1 drink per day for nonpregnant women.  Eat healthy foods. This involves: ? Eating 5 or more servings of fruits and vegetables a day. ? Making dietary changes that address high blood pressure (hypertension), high cholesterol, diabetes, or obesity.  Manage your cholesterol levels. ? Making food choices that are high in fiber and low in saturated fat, trans fat, and cholesterol may control cholesterol levels. ? Take any prescribed medicines to control cholesterol as directed by your health care provider.  Manage your diabetes. ? Controlling your carbohydrate and sugar intake is recommended to manage diabetes. ? Take any prescribed medicines to control diabetes as directed by your health care provider.  Control your hypertension. ? Making food choices that are low in salt (sodium), saturated fat, trans fat, and cholesterol is recommended to manage hypertension. ? Ask your health care provider if you need treatment to lower your blood pressure. Take any prescribed medicines to control hypertension as directed by your health care provider. ? If you are 18-39 years of age, have your blood pressure checked every 3-5 years. If you are 40 years of age or older, have your blood pressure checked every year.  Maintain a healthy weight. ? Reducing calorie intake and making food choices that are low in sodium, saturated fat, trans fat, and cholesterol are recommended to manage  weight.  Stop drug abuse.  Avoid taking birth control pills. ? Talk to your health care provider about the risks of taking birth control pills if you are over 35 years old, smoke, get migraines, or have ever had a blood clot.  Get evaluated for sleep disorders (sleep apnea). ? Talk to your health care provider about getting a sleep evaluation if you snore a lot or have excessive sleepiness.  Take medicines only as directed by your health care provider. ? For some people, aspirin or blood thinners (anticoagulants) are helpful in reducing the risk of forming abnormal blood clots that can lead to stroke. If you have the irregular heart rhythm of atrial fibrillation, you should be on a blood thinner unless there is a good reason you cannot take them. ? Understand all your medicine instructions.  Make sure that other conditions (such as anemia or atherosclerosis) are addressed. Get help right away if:  You have sudden weakness or numbness of the face, arm, or leg, especially on one side of the body.  Your face or eyelid droops to one side.  You have sudden confusion.  You have trouble speaking (aphasia) or understanding.  You have sudden trouble seeing in one or both eyes.  You have sudden trouble walking.  You have dizziness.  You have a loss of balance or coordination.  You have a sudden, severe headache with no known cause.  You have new chest pain or an irregular heartbeat. Any of these symptoms may represent a serious problem that is an emergency. Do not wait to see if the symptoms will go away.   Get medical help at once. Call your local emergency services (911 in [place] Do not drive yourself to the hospital. This information is not intended to replace advice given to you by your health care provider. Make sure you discuss any questions you have with your health care provider. Document Released: 07/15/2004 Document Revised: 11/13/2015 Document Reviewed: 12/08/2012 Elsevier  Interactive Patient Education  2017 Elsevier Inc.     Preventing Cerebrovascular Disease Arteries are blood vessels that carry blood that contains oxygen from the heart to all parts of the body. Cerebrovascular disease affects arteries that supply the brain. Any condition that blocks or disrupts blood flow to the brain can cause cerebrovascular disease. Brain cells that lose blood supply start to die within minutes (stroke). Stroke is the main danger of cerebrovascular disease. Atherosclerosis and high blood pressure are common causes of cerebrovascular disease. Atherosclerosis is narrowing and hardening of an artery that results when fat, cholesterol, calcium, or other substances (plaque) build up inside an artery. Plaque reduces blood flow through the artery. High blood pressure increases the risk of bleeding inside the brain. Making diet and lifestyle changes to prevent atherosclerosis and high blood pressure lowers your risk of cerebrovascular disease. What nutrition changes can be made?  Eat more fruits, vegetables, and whole grains.  Reduce how much saturated fat you eat. To do this, eat less red meat and fewer full-fat dairy products.  Eat healthy proteins instead of red meat. Healthy proteins include: ? Fish. Eat fish that contains heart-healthy omega-3 fatty acids, twice a week. Examples include salmon, albacore tuna, mackerel, and herring. ? Chicken. ? Nuts. ? Low-fat or nonfat yogurt.  Avoid processed meats, like bacon and lunchmeat.  Avoid foods that contain: ? A lot of sugar, such as sweets and drinks with added sugar. ? A lot of salt (sodium). Avoid adding extra salt to your food, as told by your health care provider. ? Trans fats, such as margarine and baked goods. Trans fats may be listed as "partially hydrogenated oils" on food labels.  Check food labels to see how much sodium, sugar, and trans fats are in foods.  Use vegetable oils that contain low amounts of  saturated fat, such as olive oil or canola oil. What lifestyle changes can be made?  Drink alcohol in moderation. This means no more than 1 drink a day for nonpregnant women and 2 drinks a day for men. One drink equals 12 oz of beer, 5 oz of wine, or 1 oz of hard liquor.  If you are overweight, ask your health care provider to recommend a weight-loss plan for you. Losing 5-10 lb (2.2-4.5 kg) can reduce your risk of diabetes, atherosclerosis, and high blood pressure.  Exercise for 30?60 minutes on most days, or as much as told by your health care provider. ? Do moderate-intensity exercise, such as brisk walking, bicycling, and water aerobics. Ask your health care provider which activities are safe for you.  Do not use any products that contain nicotine or tobacco, such as cigarettes and e-cigarettes. If you need help quitting, ask your health care provider. Why are these changes important? Making these changes lowers your risk of many diseases that can cause cerebrovascular disease and stroke. Stroke is a leading cause of death and disability. Making these changes also improves your overall health and quality of life. What can I do to lower my risk? The following factors make you more likely to develop cerebrovascular disease:  Being overweight.  Smoking.  Being physically inactive.    Eating a high-fat diet.  Having certain health conditions, such as: ? Diabetes. ? High blood pressure. ? Heart disease. ? Atherosclerosis. ? High cholesterol. ? Sickle cell disease.  Talk with your health care provider about your risk for cerebrovascular disease. Work with your health care provider to control diseases that you have that may contribute to cerebrovascular disease. Your health care provider may prescribe medicines to help prevent major causes of cerebrovascular disease. Where to find more information: Learn more about preventing cerebrovascular disease from:  National Heart, Lung, and  Blood Institute: www.nhlbi.nih.gov/health/health-topics/topics/stroke  Centers for Disease Control and Prevention: cdc.gov/stroke/about.htm  Summary  Cerebrovascular disease can lead to a stroke.  Atherosclerosis and high blood pressure are major causes of cerebrovascular disease.  Making diet and lifestyle changes can reduce your risk of cerebrovascular disease.  Work with your health care provider to get your risk factors under control to reduce your risk of cerebrovascular disease. This information is not intended to replace advice given to you by your health care provider. Make sure you discuss any questions you have with your health care provider. Document Released: 06/22/2015 Document Revised: 12/26/2015 Document Reviewed: 06/22/2015 Elsevier Interactive Patient Education  2018 Elsevier Inc.  

## 2017-03-17 NOTE — Addendum Note (Signed)
Addended by: Burton Apley A on: 03/17/2017 10:27 AM   Modules accepted: Orders

## 2017-03-22 IMAGING — CR DG CHEST 1V PORT
1 series · 1 of 1 positions shown · non-contrast
Comparison: 10/28/2014

CLINICAL DATA: Found on responsive, shortness of breath

EXAM:
PORTABLE CHEST 1 VIEW

[ap]
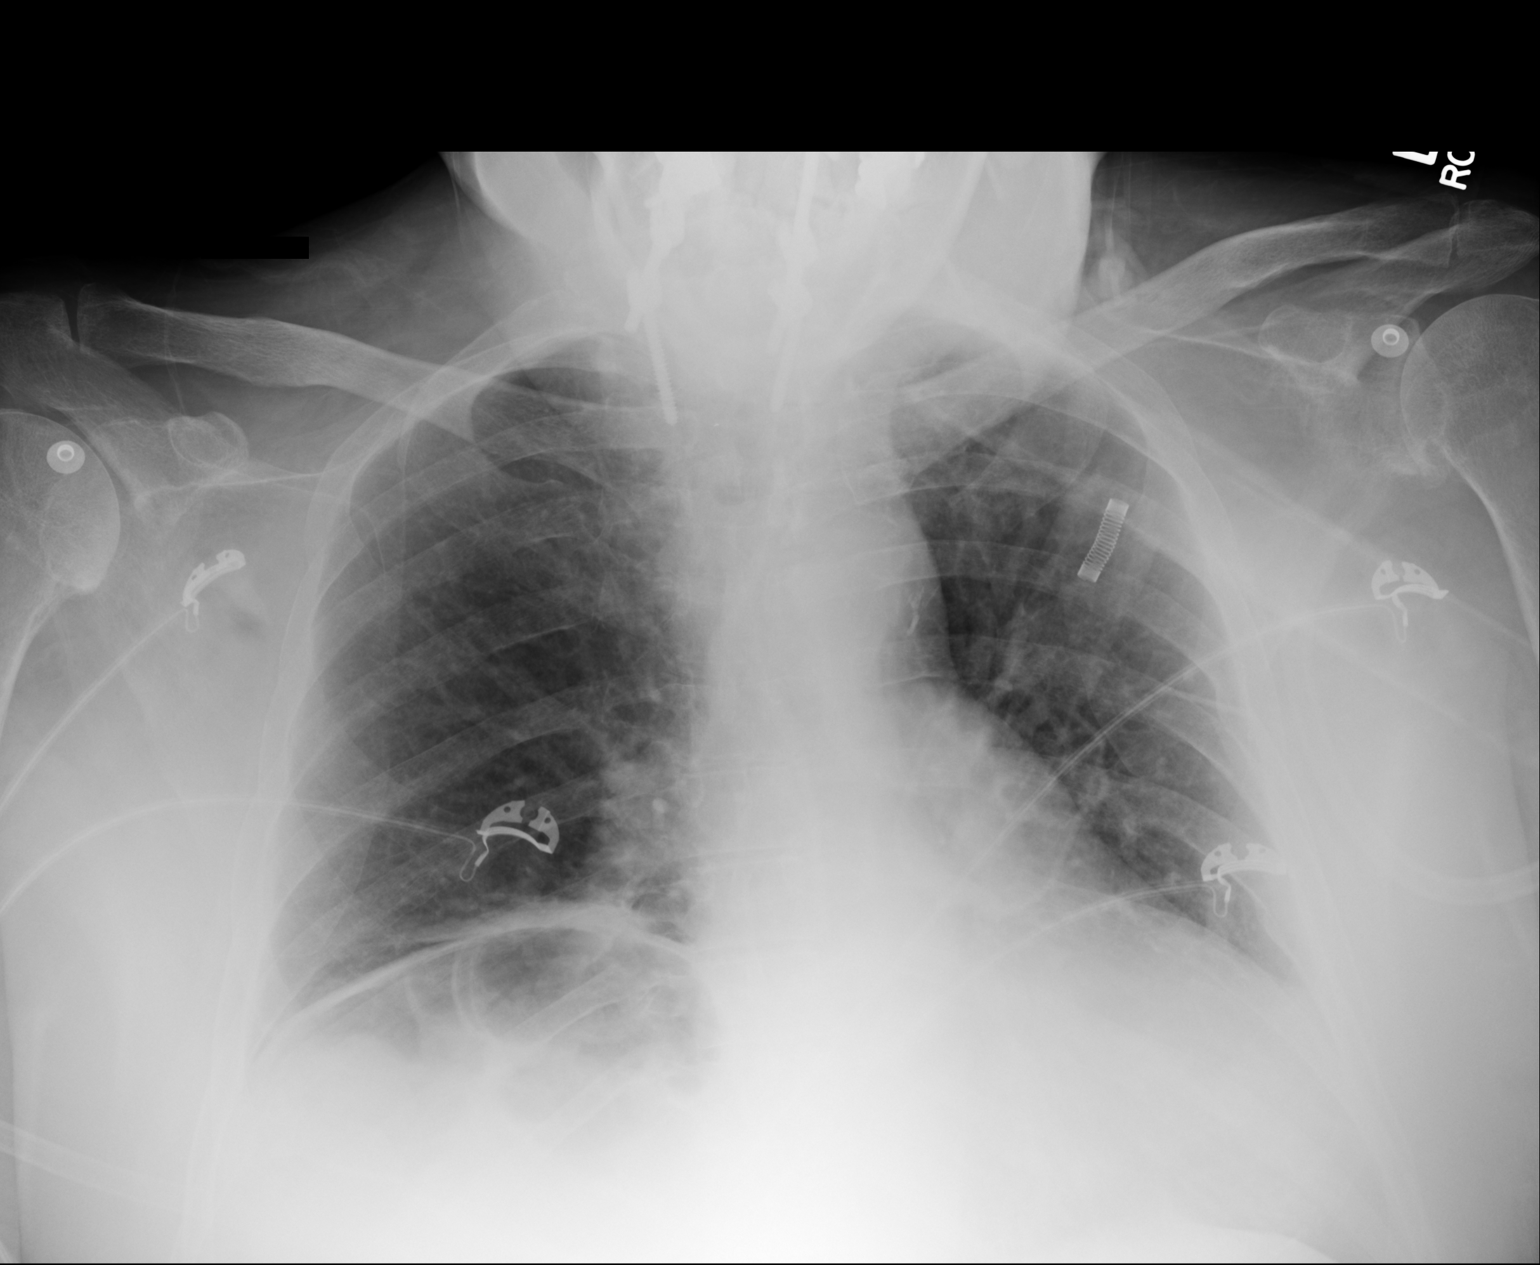

[1 of 1 positions shown; findings below may reference images not displayed]

FINDINGS: Cardiomediastinal silhouette is stable. Elevation of the right
hemidiaphragm again noted. Stable right basilar atelectasis or
scarring. No segmental infiltrate or pulmonary edema. Metallic
fixation material cervical spine again noted.
IMPRESSION: No infiltrate or pulmonary edema. Again noted chronic elevation of
the right hemidiaphragm with basilar atelectasis or scarring.

## 2017-03-22 IMAGING — CT CT CERVICAL SPINE W/O CM
3 of 8 series · 10 of 33 positions shown, 12 images · non-contrast
Comparison: 10/28/2014

CLINICAL DATA: Fall

EXAM:
CT HEAD WITHOUT CONTRAST
CT CERVICAL SPINE WITHOUT CONTRAST
TECHNIQUE: Multidetector CT imaging of the head and cervical spine was
performed following the standard protocol without intravenous
contrast. Multiplanar CT image reconstructions of the cervical spine
were also generated.

[Series 6: sagittal bone · sagittal · 0.33mm/px · 5 of 77 slices shown, 6 images]
[im 26/77  bone]
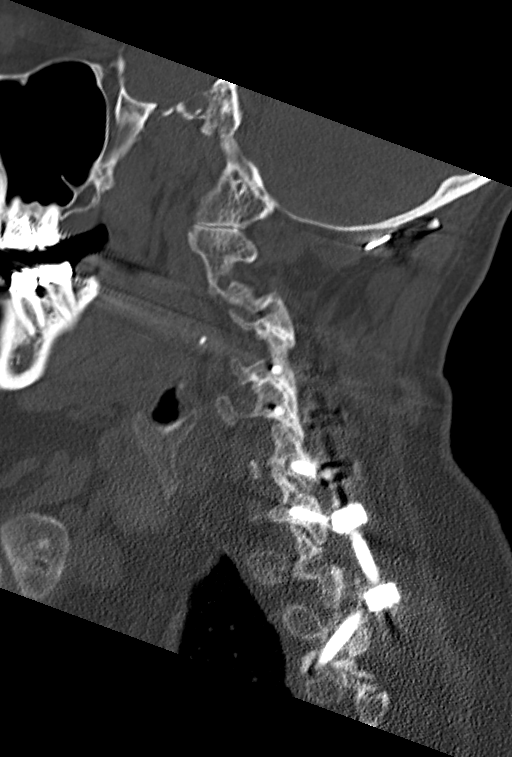
[im 32/77  bone]
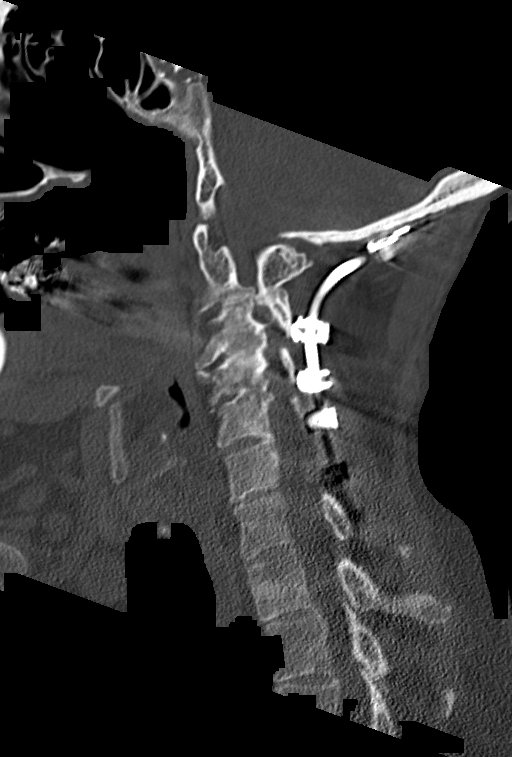
[im 39/77  soft-tissue]
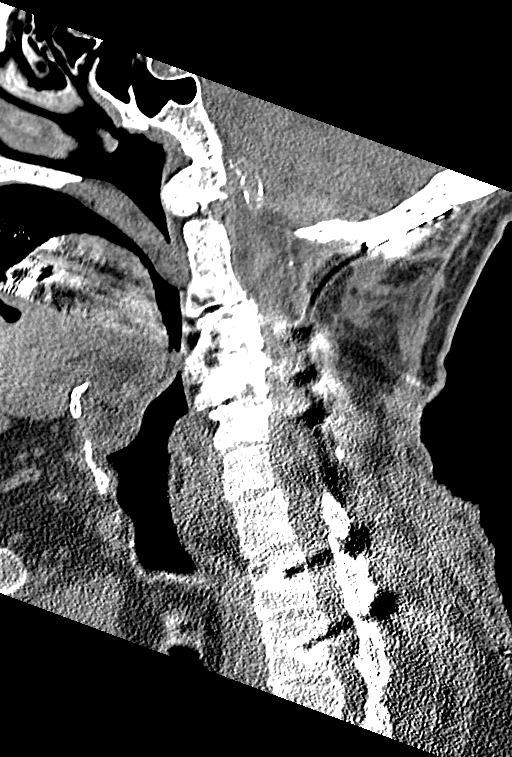
[im 39/77  bone]
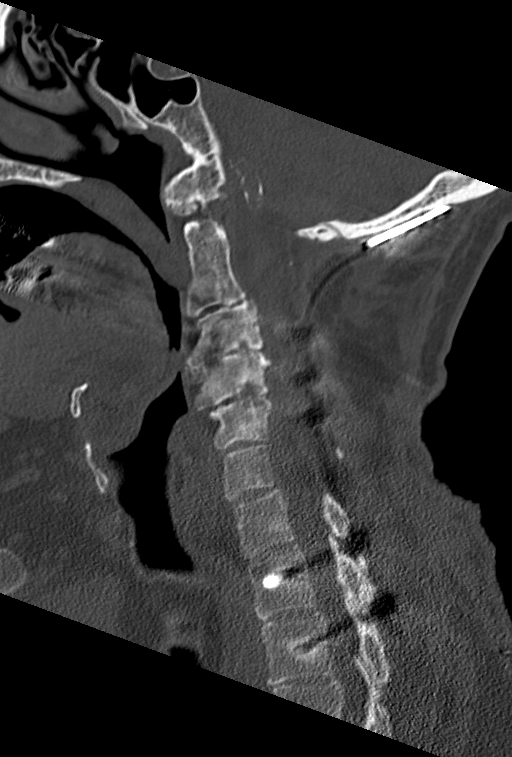
[im 45/77  bone]
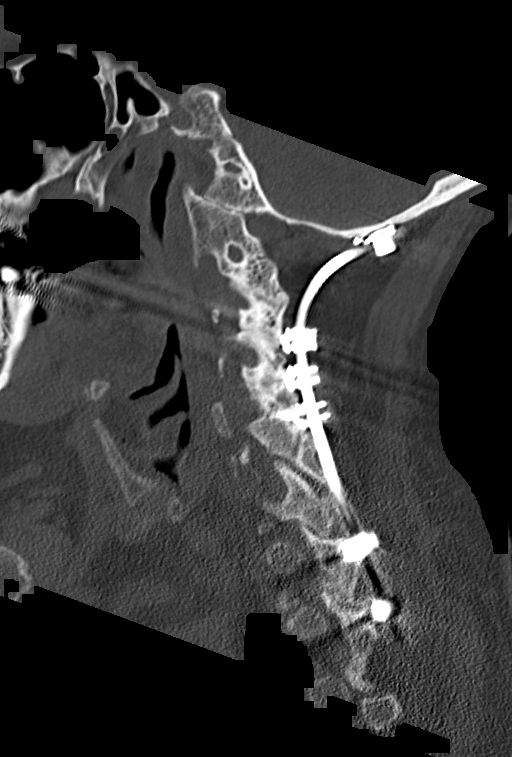
[im 51/77  bone]
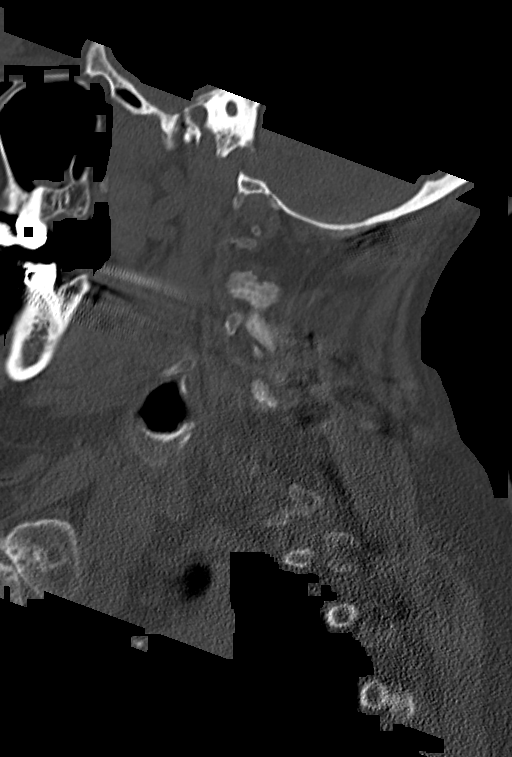

[Series 7: coronal bone · coronal · 0.25mm/px · 3 of 83 slices shown]
[im 28/83  bone]
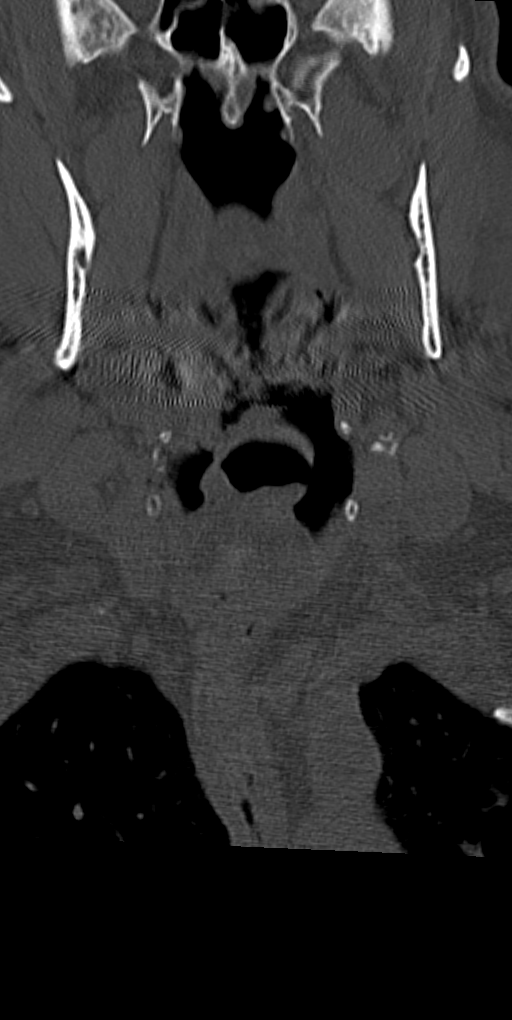
[im 37/83  bone]
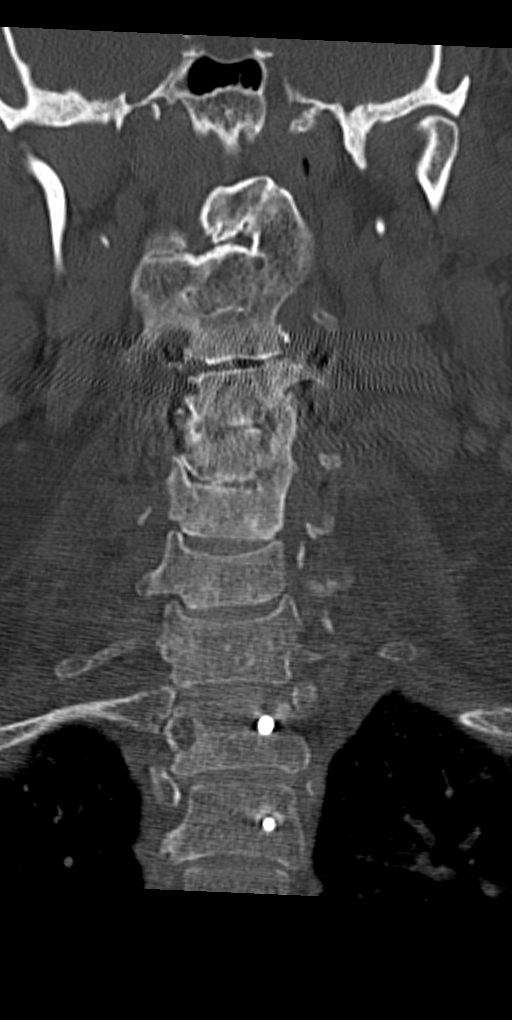
[im 46/83  bone]
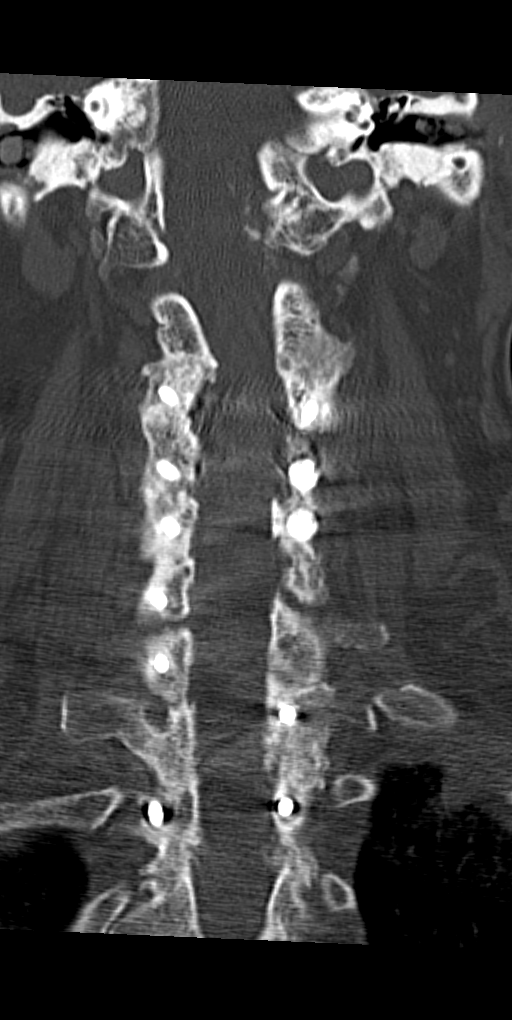

[Series 8: axial · axial · 0.30mm/px · z∈[+211,+279]mm · 2 of 118 slices shown, 3 images]
[im 40/118  soft-tissue]
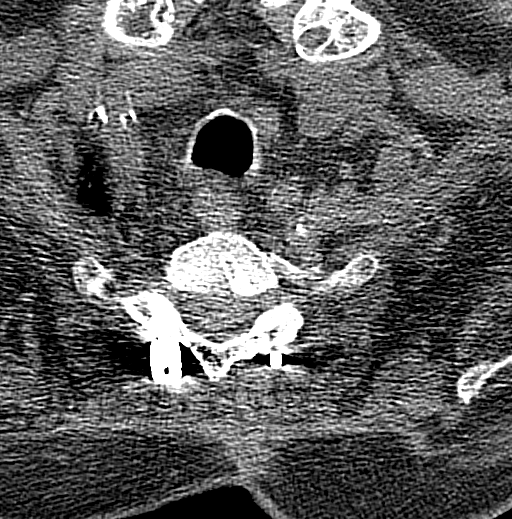
[im 40/118  bone]
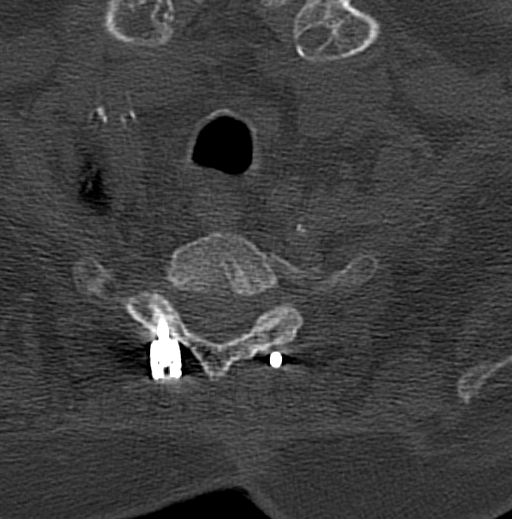
[im 79/118  bone]
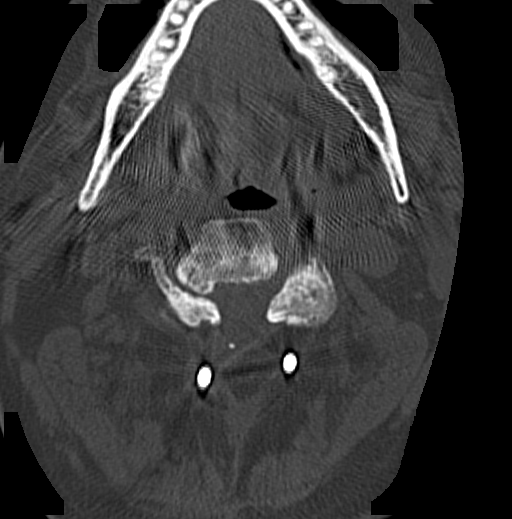

[10 of 33 positions shown; findings below may reference images not displayed]

FINDINGS: CT HEAD FINDINGS

Diffuse age-related atrophy. Scattered punctate calcifications right
hemisphere unchanged. These are not likely to be of acute clinical
significance. There is no evidence of mass. There is no evidence of
acute vascular territory infarct. There is no hydrocephalus. There
is no contusion or extra-axial hemorrhage. There is no skull
fracture. Postsurgical changes seen in the posterior central
occipital region. Minimal inflammatory change in the visualized
paranasal sinuses.

CT CERVICAL SPINE FINDINGS

Again identified is extensive degenerative and postoperative change
with posterior decompression. Hardware from the occiput foot to the
upper thoracic spine again identified. Normal alignment. No
prevertebral soft tissue swelling. No fracture. Particularly severe
C3-4 and C4-5 degenerative disc disease stable. Loosening around the
T2 pedicle screws is unchanged.
IMPRESSION: No acute intracranial abnormality.

Stable chronic postoperative and degenerative changes in the
cervical spine.

## 2017-03-25 ENCOUNTER — Telehealth: Payer: Self-pay | Admitting: Pulmonary Disease

## 2017-03-25 NOTE — Telephone Encounter (Signed)
[facility] calling to check status on surgical clearance   Inetta Fermo will re fax to [phone]

## 2017-03-25 NOTE — Telephone Encounter (Signed)
Pt has appt 03/29/17 for surgery, [facility] has been notified.

## 2017-03-29 ENCOUNTER — Encounter: Payer: Self-pay | Admitting: Pulmonary Disease

## 2017-03-29 ENCOUNTER — Ambulatory Visit (INDEPENDENT_AMBULATORY_CARE_PROVIDER_SITE_OTHER): Payer: Medicare Other | Admitting: Pulmonary Disease

## 2017-03-29 VITALS — BP 162/80 | HR 58 | Resp 16 | Ht 68.0 in | Wt 211.0 lb

## 2017-03-29 DIAGNOSIS — J986 Disorders of diaphragm: Secondary | ICD-10-CM | POA: Diagnosis not present

## 2017-03-29 DIAGNOSIS — R600 Localized edema: Secondary | ICD-10-CM

## 2017-03-29 DIAGNOSIS — Z01811 Encounter for preprocedural respiratory examination: Secondary | ICD-10-CM

## 2017-03-29 DIAGNOSIS — I6523 Occlusion and stenosis of bilateral carotid arteries: Secondary | ICD-10-CM | POA: Diagnosis not present

## 2017-03-29 DIAGNOSIS — T884XXA Failed or difficult intubation, initial encounter: Secondary | ICD-10-CM

## 2017-03-29 DIAGNOSIS — J9612 Chronic respiratory failure with hypercapnia: Secondary | ICD-10-CM | POA: Diagnosis not present

## 2017-03-29 DIAGNOSIS — Z9889 Other specified postprocedural states: Secondary | ICD-10-CM

## 2017-03-29 NOTE — Progress Notes (Addendum)
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 No pleural fluid on the left. Minimal hazy infiltrate present in the apex of the left upper lobe. The right chest shows a small effusion largely layering dependently. There is extensive airspace filling in the upper lobe consistent with bronchopneumonia. There is patchy density in the right lower lobe in the right middle lobe that could be atelectasis or lesser bronchopneumonia. The appearance is improved since the CT of 03/03/16 Echocardiogram 07/17/16: grade I diastolic dysfunction. LVEF 60%. LA mildly dilated. RV mildly dilated CT head and neck 09/26/16: No acute intracranial abnormality identified. Small scalp contusion in the superior frontal region. No displaced calvarial fracture. 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 No acute fracture of the cervical spine. Stable postsurgical changes related to occipital to T2 fusion, C1 the C6 posterior decompression. No apparent hardware related complication. Stable advanced cervical spondylosis greatest at the C3 through C5 levels. Hospitalized 04/08-04/10/18 for hypercapnic respiratory failure  SUBJ: Last visit was 10/28/16. He is sent back to me today for preoperative evaluation. Since last seen, he has suffered a left hand  injury for which she is scheduled to undergo surgery by Dr. Stephenie Acres. She requests preoperative pulmonary evaluation. At the present time, he has no new respiratory complaints. He does note increasing right lower extremity edema. This has been an intermittent problem in the past.. Denies CP, fever, purulent sputum, hemoptysis and calf tenderness.   OBJ: Vitals:   03/29/17 0832 03/29/17 0838  BP:  (!) 162/80  Pulse:  (!) 58  Resp: 16   SpO2:  92%  Weight: 95.7 kg (211 lb)   Height:  (1.727 m)   Room air  NAD HEENT: normal. Neck: Extremely limited mobility.  Chest: Moderately diminished breath sounds without wheezes or other adventitious sounds Cardiac: regular rate and rhythm with no murmurs noted. Abdomen: soft, nontender, normal bowel sounds. Extremities: L wrist brace noted. RLE pretibial ankle and pedal pitting edema   DATA: BMP Latest Ref Rng & Units 03/13/2017 09/28/2016 09/27/2016  Glucose 65 - 99 mg/dL 94 956(O) 130(Q)  BUN 6 - 20 mg/dL 65(H) 84(O) 96(E)  Creatinine 0.61 - 1.24 mg/dL 9.52 8.41(L) 2.44(W)  Sodium 135 - 145 mmol/L 133(L) 137 138  Potassium 3.5 - 5.1 mmol/L 4.3 4.8 4.5  Chloride 101 - 111 mmol/L 92(L) 98(L) 98(L)  CO2 22 - 32 mmol/L 33(H) 33(H) 33(H)  Calcium 8.9 - 10.3 mg/dL 9.1 1.0(U) 7.2(Z)   CBC Latest Ref Rng & Units 03/13/2017 09/28/2016 09/27/2016  WBC 3.8 - 10.6 K/uL 5.6 - 8.4  Hemoglobin 13.0 - 18.0 g/dL 11.6(L) 10.7(L) 12.3(L)  Hematocrit 40.0 - 52.0 % 35.0(L) - 37.5(L)  Platelets 150 - 440 K/uL 129(L) - 158   CXR: No recent film  IMPRESSION: 1) Chronic respiratory failure with hypercapnia - due to COPD and 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 However, he is an extremely high risk patient with chronic hypercarbic respiratory failure and an extremely difficult airway for intubation. His hypercarbic respiratory failure might be exacerbated by any sedatives required during the surgery or by any opiate analgesics required postoperatively. This does not mean that the procedure cannot be done safely. I believe it requires thoughtful preparation to address any potential respiratory or airway complications that might arise.  PLAN/REC: 1) Continue Symibcort and Spiriva as maintenance medications 2) Continue Duoneb as rescue medication to be used as needed 3) continue oxygen with exertion and sleep 4) there are no therapies or interventions that can be made at this time that will mitigate his risk of perioperative pulmonary complications 5) during surgery, it would be desirable to minimize any sedating medications 6) postoperatively, it would be desirable to minimize any opiate analgesics or other sedatives 7) I recommend that anesthesia be prepared in advance to manage a very difficult intubation in case any respiratory complications arise. It is notable that he was successfully intubated (and extubated) in January of this year for recurrent hypercarbic respiratory failure. Review of prior intubation documentation might be warranted. 8) I will forward this progress note to Dr. Willeen Cass who previously performed emergency tracheostomy tube on Chad Huffman in September 2017 to see if Dr. Willeen Cass has any further thoughts or recommendations 9) RLE venous ultrasound has been ordered to assess asymmetric lower extremity edema 10) follow-up in 3 months  or sooner as needed  Chad Fischer, MD PCCM service Mobile [phone] Pager [phone] 03/29/2017 3:56 PM

## 2017-03-29 NOTE — Patient Instructions (Addendum)
Ultrasound exam of your right lower extremity to rule out blood clot has been ordered  As we discussed, your risk of respiratory problems around the time of surgery is significantly higher than the average. However, the procedure itself is relatively low risk. With the proper precautions, the procedure can be undertaken reasonably safely. I will discuss with your orthopedic surgeon and with Dr. Willeen Cass (ear, no, throat surgeon who performed your tracheostomy tube in the past)  No changes in your medical regimen  Follow-up with me after surgery or in 3 months

## 2017-03-30 ENCOUNTER — Ambulatory Visit: Payer: Medicare Other | Admitting: Family

## 2017-04-04 ENCOUNTER — Ambulatory Visit
Admission: RE | Admit: 2017-04-04 | Discharge: 2017-04-04 | Disposition: A | Discharge status: Home / Self Care | Payer: Medicare Other | Source: Ambulatory Visit | Attending: Pulmonary Disease | Admitting: Pulmonary Disease

## 2017-04-04 DIAGNOSIS — R600 Localized edema: Secondary | ICD-10-CM | POA: Diagnosis present

## 2017-04-27 ENCOUNTER — Ambulatory Visit: Payer: Medicare Other | Admitting: Family

## 2017-05-12 IMAGING — US US CAROTID DUPLEX BILAT
1 series · 13 of 24 positions shown · non-contrast
Comparison: none

CLINICAL DATA: Syncope, hypertension, right eye visual disturbance.

EXAM:
BILATERAL CAROTID DUPLEX ULTRASOUND
TECHNIQUE: Gray scale imaging, color Doppler and duplex ultrasound were
performed of bilateral carotid and vertebral arteries in the neck.
on 09/22/2015

[Series 1: us carotid duplex bilat · 0.08mm/px · 13 of 71 slices shown]
[im 1/71]
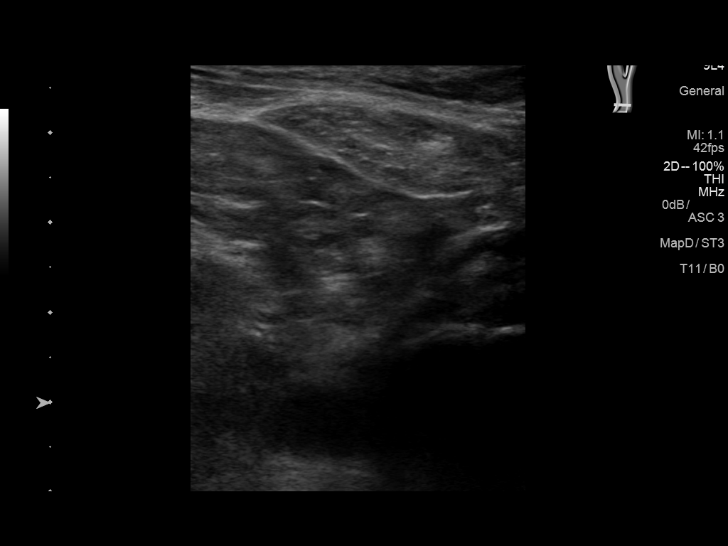
[im 7/71]
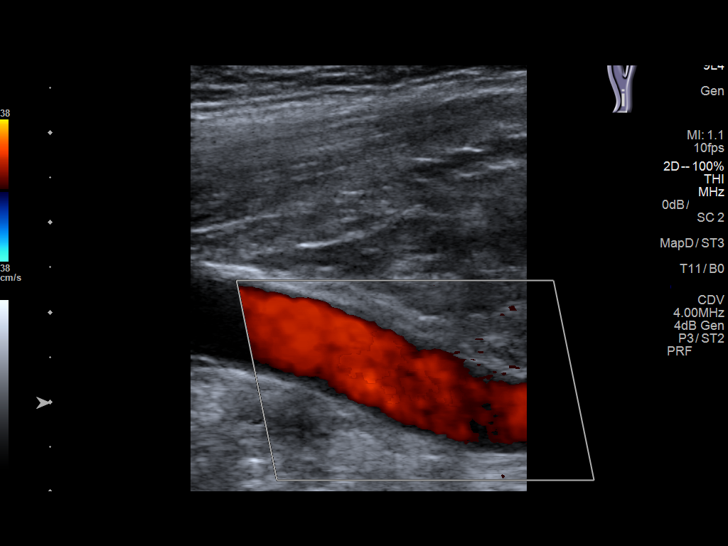
[im 13/71]
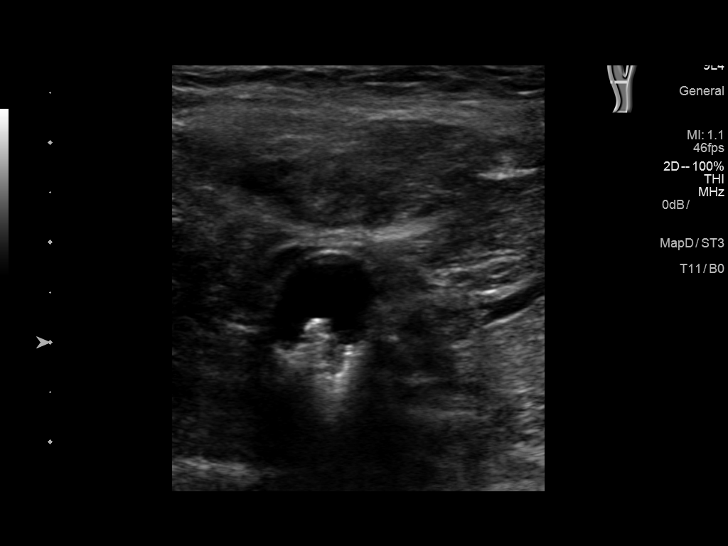
[im 19/71]
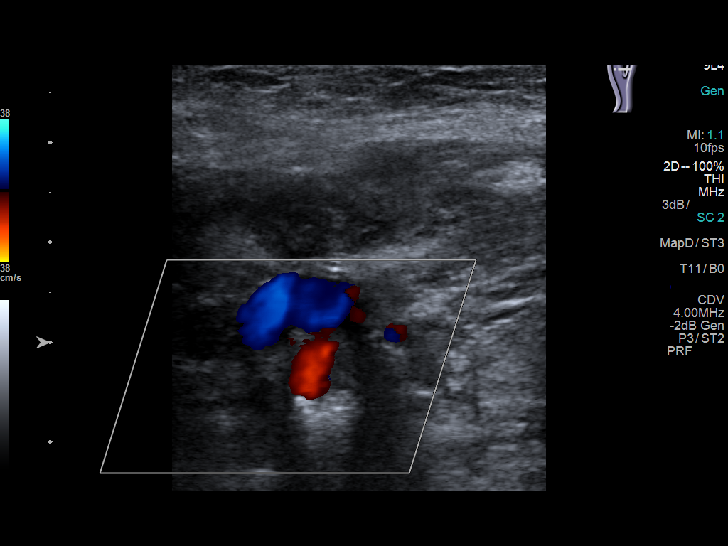
[im 25/71]
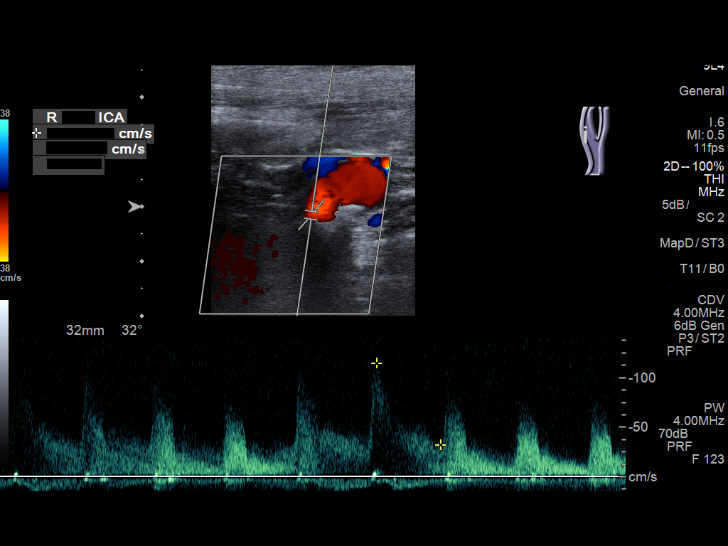
[im 31/71]
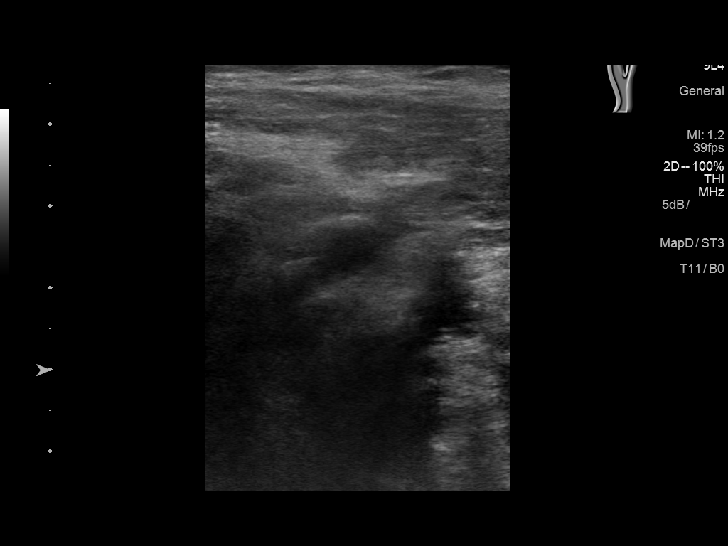
[im 37/71]
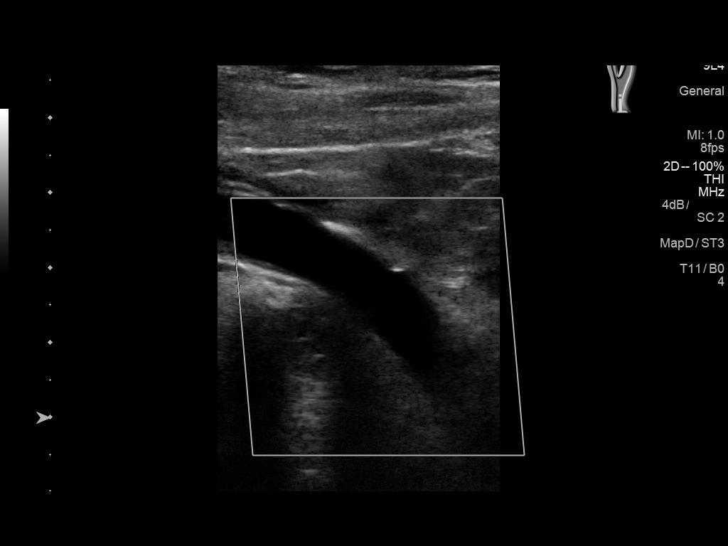
[im 40/71]
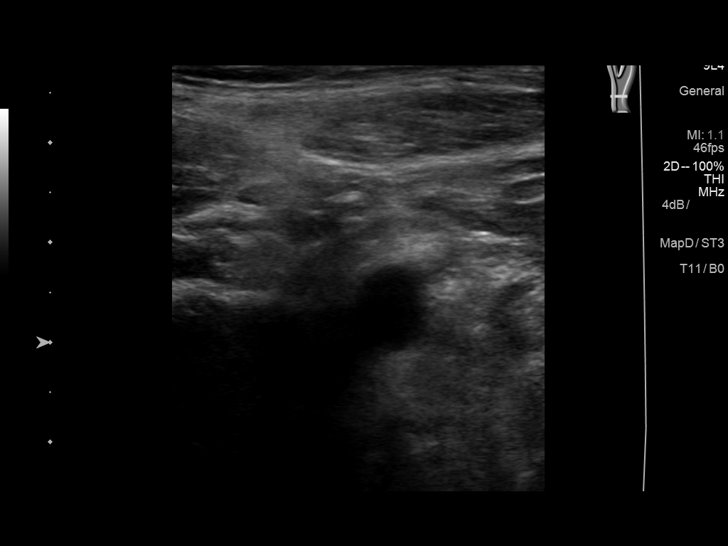
[im 46/71]
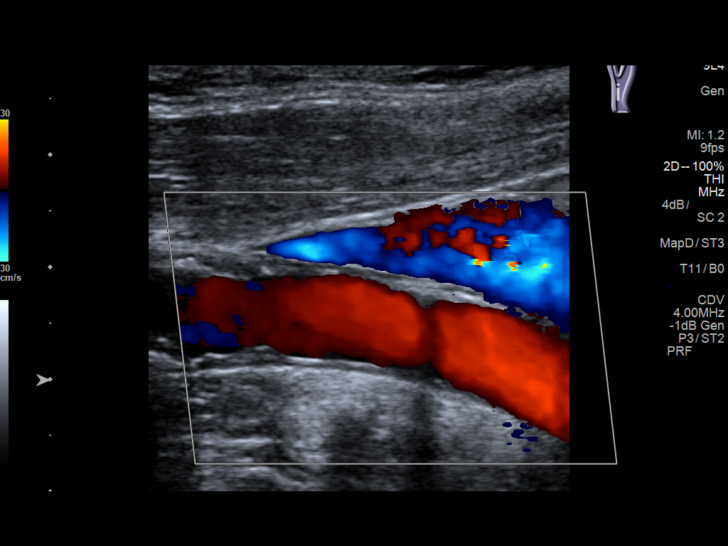
[im 52/71]
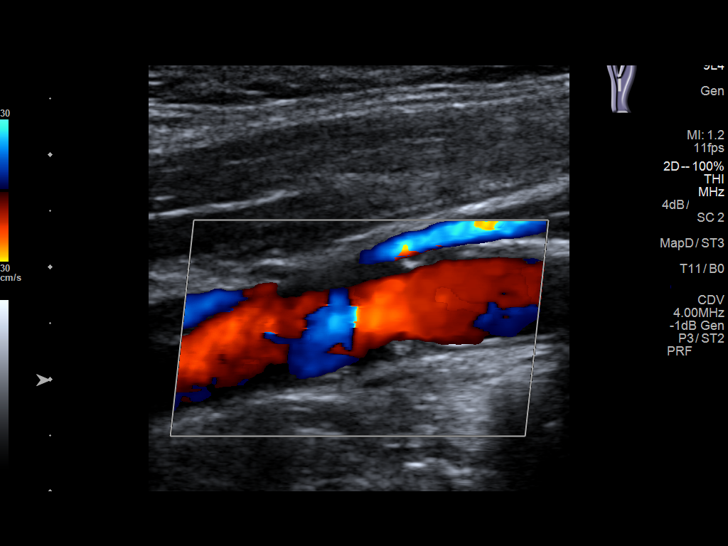
[im 58/71]
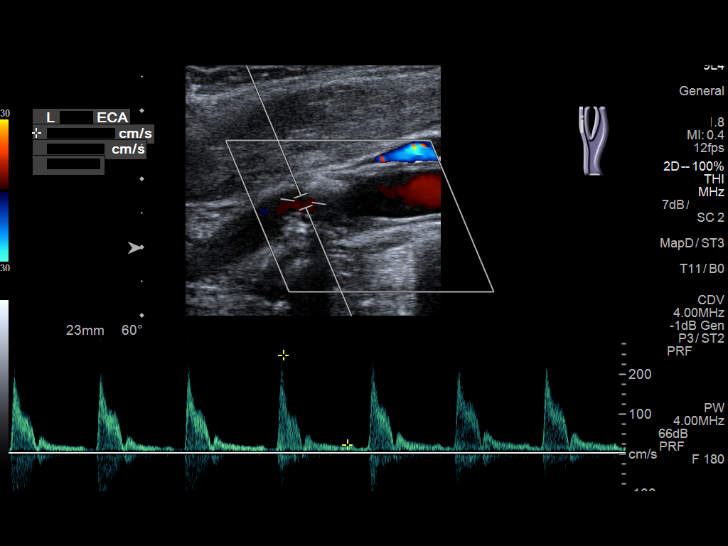
[im 64/71]
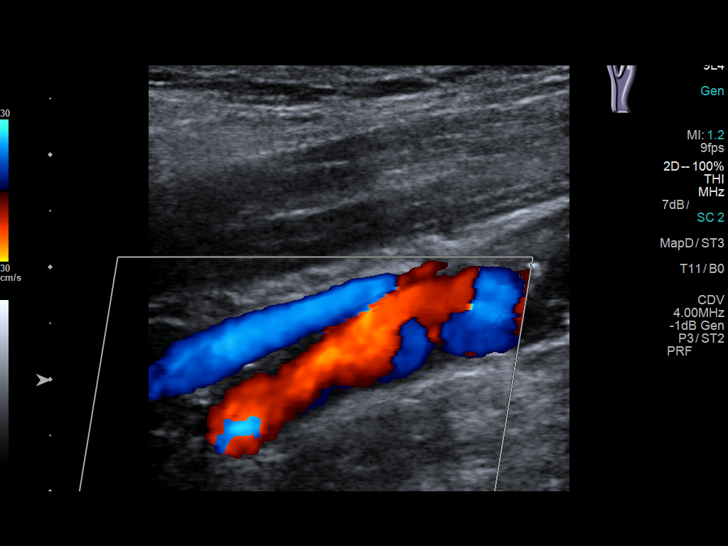
[im 71/71]
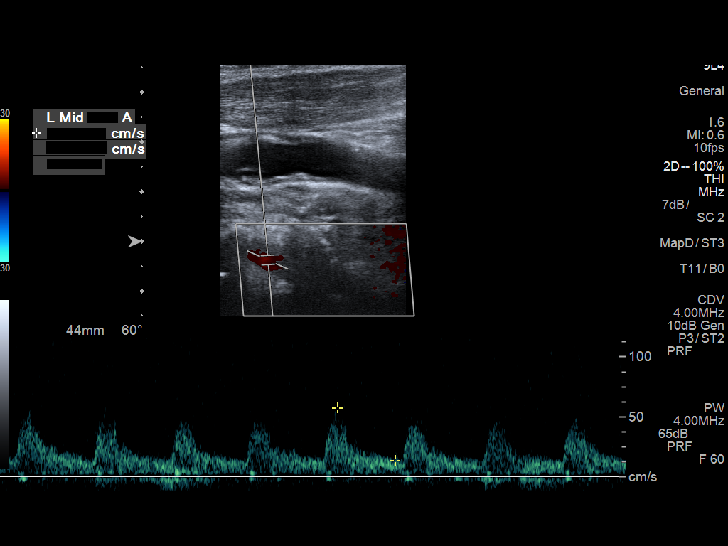

[13 of 24 positions shown; findings below may reference images not displayed]

FINDINGS: Criteria: Quantification of carotid stenosis is based on velocity
parameters that correlate the residual internal carotid diameter
with NASCET-based stenosis levels, using the diameter of the distal
internal carotid lumen as the denominator for stenosis measurement.

The following velocity measurements were obtained:

RIGHT

ICA:  316/78 cm/sec

CCA:  90/14 cm/sec

SYSTOLIC ICA/CCA RATIO:

DIASTOLIC ICA/CCA RATIO:

ECA:   cm/sec

LEFT

ICA:  128/20 cm/sec

CCA:  109/22 cm/sec

SYSTOLIC ICA/CCA RATIO:

DIASTOLIC ICA/CCA RATIO:

ECA:  288 cm/sec

RIGHT CAROTID ARTERY: Common and internal carotid arteries are
tortuous. There is a significant amount of calcified plaque at the
level of the distal bulb and proximal ICA. Based on velocity
elevation, estimated ICA stenosis is estimated to be greater than
70%.

RIGHT VERTEBRAL ARTERY: The right vertebral artery was not
visualized.

LEFT CAROTID ARTERY: Moderate amount of predominately calcified
plaque at the level of the carotid bulb and proximal left ICA. Based
on velocities, estimated left ICA stenosis is 50- 69%.

LEFT VERTEBRAL ARTERY: Antegrade flow with normal waveform and
velocity.
IMPRESSION: Significant atherosclerotic plaque at both carotid bifurcations,
right greater than left. Estimated right ICA stenosis is greater
than 70%. Estimated left ICA stenosis is 50- 69%.

## 2017-05-31 ENCOUNTER — Other Ambulatory Visit: Payer: Self-pay | Admitting: Pulmonary Disease

## 2017-06-13 ENCOUNTER — Emergency Department: Payer: Medicare Other

## 2017-06-13 ENCOUNTER — Emergency Department
Admission: EM | Admit: 2017-06-13 | Discharge: 2017-06-13 | Disposition: A | Discharge status: Home / Self Care | Payer: Medicare Other | Attending: Emergency Medicine | Admitting: Emergency Medicine

## 2017-06-13 ENCOUNTER — Other Ambulatory Visit: Payer: Self-pay

## 2017-06-13 DIAGNOSIS — Z87891 Personal history of nicotine dependence: Secondary | ICD-10-CM | POA: Insufficient documentation

## 2017-06-13 DIAGNOSIS — J449 Chronic obstructive pulmonary disease, unspecified: Secondary | ICD-10-CM | POA: Diagnosis not present

## 2017-06-13 DIAGNOSIS — M545 Low back pain, unspecified: Secondary | ICD-10-CM

## 2017-06-13 DIAGNOSIS — E039 Hypothyroidism, unspecified: Secondary | ICD-10-CM | POA: Diagnosis not present

## 2017-06-13 DIAGNOSIS — Z7902 Long term (current) use of antithrombotics/antiplatelets: Secondary | ICD-10-CM | POA: Diagnosis not present

## 2017-06-13 DIAGNOSIS — Z79899 Other long term (current) drug therapy: Secondary | ICD-10-CM | POA: Insufficient documentation

## 2017-06-13 DIAGNOSIS — I10 Essential (primary) hypertension: Secondary | ICD-10-CM | POA: Insufficient documentation

## 2017-06-13 DIAGNOSIS — N390 Urinary tract infection, site not specified: Secondary | ICD-10-CM | POA: Diagnosis not present

## 2017-06-13 DIAGNOSIS — G8929 Other chronic pain: Secondary | ICD-10-CM | POA: Insufficient documentation

## 2017-06-13 DIAGNOSIS — Z7982 Long term (current) use of aspirin: Secondary | ICD-10-CM | POA: Insufficient documentation

## 2017-06-13 LAB — URINALYSIS, COMPLETE (UACMP) WITH MICROSCOPIC
BILIRUBIN URINE: NEGATIVE
GLUCOSE, UA: NEGATIVE mg/dL
HGB URINE DIPSTICK: NEGATIVE
KETONES UR: NEGATIVE mg/dL
NITRITE: NEGATIVE
PH: 5 (ref 5.0–8.0)
Protein, ur: 100 mg/dL — AB
SPECIFIC GRAVITY, URINE: 1.021 (ref 1.005–1.030)

## 2017-06-13 MED ORDER — HYDROCODONE-ACETAMINOPHEN 5-325 MG PO TABS
2.0000 | ORAL_TABLET | Freq: Once | ORAL | Status: AC
  Administered 2017-06-13: 2 via ORAL
  Filled 2017-06-13: qty 2

## 2017-06-13 MED ORDER — LEVOFLOXACIN 500 MG PO TABS
500.0000 mg | ORAL_TABLET | Freq: Once | ORAL | Status: AC
  Administered 2017-06-13: 500 mg via ORAL
  Filled 2017-06-13: qty 1

## 2017-06-13 MED ORDER — HYDROCODONE-ACETAMINOPHEN 5-325 MG PO TABS: 1.0000 | ORAL_TABLET | ORAL | 0 refills | Status: DC | PRN

## 2017-06-13 MED ORDER — LEVOFLOXACIN 500 MG PO TABS: 500.0000 mg | ORAL_TABLET | Freq: Every day | ORAL | 0 refills | Status: AC

## 2017-06-13 NOTE — ED Notes (Signed)
Call to brother [name] legal guardian. No answer. Pt states to call [facility] who [place] but number is not in his chart.

## 2017-06-13 NOTE — ED Notes (Signed)
[facility] [phone] no answer and no voice mail

## 2017-06-13 NOTE — ED Triage Notes (Signed)
Pt presents w/ c/o low back pain that woke him from sleep. Pt lives alone and has a legal guardian who is his brother. Pt wears O2 PRN. Pt via EMS, stable to transport in wheel chair.

## 2017-06-13 NOTE — ED Provider Notes (Signed)
[facility] Emergency Department Provider Note  Time seen: 2:57 AM  I have reviewed the triage vital signs and the nursing notes.   HISTORY  Chief Complaint Back Pain    HPI Chad Huffman is a 71 y.o. male with a past medical history of chronic neck and back pain, COPD, multiple back surgeries, hypertension, presents to the emergency department for back pain.  According to the patient he will typically get back pain if he stands for too long or if he bends over.  He states today he was awoken from his sleep due to lower back pain which he states is somewhat atypical.  Denies any recent falls or trauma.  Patient states a history of frequent urinary tract infections but denies any current dysuria or cloudy urine.  Denies any weakness or numbness.  Denies any fever.  Scribed his back pain is mild lying in bed moderate with any type of movement.   Past Medical History:  Diagnosis Date  . Antalgic gait   . Carotid artery occlusion   . Chronic neck and back pain   . Complication of anesthesia    pt has had cervical fusion-limited neck flextion  . COPD (chronic obstructive pulmonary disease) (HCC)   . Depression   . DJD (degenerative joint disease)    Right shoulder  . Hypertension   . Lumbago   . Peripheral neuropathy     Patient Active Problem List   Diagnosis Date Noted  . Hematemesis without nausea   . Pressure injury of skin 09/14/2016  . Weakness 09/13/2016  . UTI (urinary tract infection) 09/13/2016  . Chronic diastolic CHF (congestive heart failure) (HCC) 09/13/2016  . COPD (chronic obstructive pulmonary disease) (HCC) 09/13/2016  . Carotid stenosis 09/09/2016  . Carotid artery stenosis 08/23/2016  . Pancytopenia (HCC) 08/23/2016  . Hyperkalemia 08/23/2016  . Leukocytosis 07/27/2016  . Thrombocytopenia (HCC) 07/27/2016  . Anemia 07/27/2016  . 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 Dysphagia,  pharyngoesophageal phase 05/18/2016  . GERD without esophagitis 05/09/2016  . Bilateral lower extremity edema 05/09/2016  . Slow transit constipation 05/09/2016  . Depression with anxiety 05/09/2016  . Respiratory failure with hypoxia and hypercapnia (HCC) 07/22/2015  . Cervical vertebral fusion   . Depression   . Chronic neck and back pain   . COPD exacerbation (HCC)   . HCAP (healthcare-associated pneumonia)   . Congestive dilated cardiomyopathy (HCC)   . Syncope and collapse   . Arthritis of right hip 08/04/2014  . Essential hypertension 08/04/2014  . Hypothyroidism 08/04/2014  . Syncope 08/04/2014  . Fall 08/04/2014    Past Surgical History:  Procedure Laterality Date  . ABDOMINAL AORTIC ANEURYSM REPAIR  2009   Endovascular AAA repair  . ABDOMINAL AORTIC ANEURYSM REPAIR    . CAROTID ANGIOGRAPHY  09/09/2016   Procedure: Carotid Angiography;  Surgeon: Nada Libman, MD;  Location: [facility] INVASIVE CV LAB;  Service: Cardiovascular;;  rt. carotid  . CARPAL TUNNEL RELEASE     bilateral CTS  . ESOPHAGOGASTRODUODENOSCOPY (EGD) WITH PROPOFOL N/A 09/28/2016   Procedure: ESOPHAGOGASTRODUODENOSCOPY (EGD) WITH PROPOFOL;  Surgeon: Midge Minium, MD;  Location: [facility] ENDOSCOPY;  Service: Endoscopy;  Laterality: N/A;  . HAMMER TOE SURGERY  12/10/2011   Procedure: HAMMER TOE CORRECTION;  Surgeon: Ernestene Kiel, DPM;  Location: [facility];  Service: Podiatry;  Laterality: Right;  Right hammertoe repair second right with 2.5x42 orthopro screw   . IR GENERIC HISTORICAL  04/05/2016   IR GASTROSTOMY TUBE MOD SED 04/05/2016 [facility]  . IR GENERIC HISTORICAL  05/12/2016   IR CM INJ ANY COLONIC TUBE W/FLUORO 05/12/2016 Malachy MoanHeath McCullough, MD [facility]  . IR GENERIC HISTORICAL  05/26/2016   IR CM INJ ANY COLONIC TUBE W/FLUORO 05/26/2016 [facility]  . IR GENERIC HISTORICAL  05/28/2016   IR REPLC GASTRO/COLONIC TUBE PERCUT W/FLUORO 05/28/2016 Brayton ElKevin Bruning, PA-C [facility]  . IR GENERIC  HISTORICAL  07/02/2016   IR GASTROSTOMY TUBE REMOVAL 07/02/2016 Brayton ElKevin Bruning, PA-C [facility]  . METATARSAL OSTEOTOMY  12/10/2011   Procedure: METATARSAL OSTEOTOMY;  Surgeon: Ernestene KielMax Hyatt, DPM;  Location: [facility];  Service: Podiatry;  Laterality: Right;  Right Keller arthroplasty with size 5 silicone implant right great toe; second metatarsal osteotomy with 2.0x14 screw  . NECK SURGERY    . PERIPHERAL VASCULAR CATHETERIZATION N/A 02/11/2015   Procedure: Carotid Angiography;  Surgeon: Nada LibmanVance W Brabham, MD;  [facility] LAB;  Service: Cardiovascular;  Laterality: N/A;  . PILONIDAL CYST / SINUS EXCISION    . Rotary cuff    . SPINE SURGERY  09/15/11   Scar tissue removed- back  . SPINE SURGERY  09/15/11   Scar tissue removed- back  . TONSILLECTOMY    . TRACHEOSTOMY TUBE PLACEMENT N/A 03/03/2016   Procedure: TRACHEOSTOMY;  Surgeon: Geanie LoganPaul Bennett, MD;  Location: [facility] ORS;  Service: ENT;  Laterality: N/A;    Prior to Admission medications   Medication Sig Start Date End Date Taking? Authorizing Provider  aspirin EC 81 MG tablet Take 81 mg by mouth daily.   Yes [provider]  baclofen (LIORESAL) 10 MG tablet Take 10 mg by mouth 3 (three) times daily as needed. 02/26/17  Yes [provider]  budesonide (PULMICORT) 0.5 MG/2ML nebulizer solution Take 2 mLs (0.5 mg total) by nebulization 2 (two) times daily. 12/21/16  Yes Merwyn KatosSimonds, [name] B, MD  clonazePAM (KLONOPIN) 0.5 MG tablet Take 1 tablet (0.5 mg total) by mouth 2 (two) times daily. 08/23/16  Yes Katharina CaperVaickute, Rima, MD  clopidogrel (PLAVIX) 75 MG tablet Take 75 mg by mouth daily.   Yes [provider]  folic acid (FOLVITE) 1 MG tablet Take 1 mg by mouth daily.    Yes [provider]  gabapentin (NEURONTIN) 300 MG capsule Take 300 mg by mouth daily.   Yes [provider]  ipratropium-albuterol (DUONEB) 0.5-2.5 (3) MG/3ML SOLN Take 3 mLs by nebulization every 6 (six) hours as needed.  Dx:J44.9 12/21/16  Yes Merwyn KatosSimonds, [name] B, MD  ketoconazole (NIZORAL) 2 % shampoo Apply 1 application topically daily as needed.   Yes [provider]  levothyroxine (SYNTHROID, LEVOTHROID) 100 MCG tablet Take 200 mcg by mouth daily before breakfast.    Yes [provider]  metoprolol tartrate (LOPRESSOR) 25 MG tablet Take 1 tablet (25 mg total) by mouth 2 (two) times daily. 07/27/16  Yes Katharina CaperVaickute, Rima, MD  Multiple Vitamin (MULTIVITAMIN WITH MINERALS) TABS tablet Take 1 tablet by mouth daily.   Yes [provider]  sertraline (ZOLOFT) 50 MG tablet Take 100 mg by mouth daily.    Yes [provider]  SYMBICORT 160-4.5 MCG/ACT inhaler TAKE 2 PUFFS BY MOUTH TWICE A DAY 06/01/17  Yes Merwyn KatosSimonds, [name] B, MD  levothyroxine (SYNTHROID, LEVOTHROID) 75 MCG tablet Take 1 tablet (75 mcg total) by mouth daily before breakfast. Patient not taking: Reported on 06/13/2017 09/16/16   Enid BaasKalisetti, Radhika, MD  oxyCODONE (OXY IR/ROXICODONE) 5 MG immediate release  tablet Take 1 tablet (5 mg total) by mouth every 8 (eight) hours as needed for moderate pain. 09/16/16   Enid BaasKalisetti, Radhika, MD  OXYGEN 4 L/min by Intratracheal route as needed (for shortness of breath).     [provider]  traMADol (ULTRAM) 50 MG tablet Take 1 tablet (50 mg total) by mouth every 6 (six) hours as needed. 03/13/17 03/13/18  Willy Eddyobinson, Patrick, MD    Allergies  Allergen Reactions  . Ampicillin Swelling and Other (See Comments)    Unable to obtain enough information to answer additional questions about this medication.  Has patient had a PCN reaction causing immediate rash, facial/tongue/throat swelling, SOB or lightheadedness with hypotension: Yes Has patient had a PCN reaction causing severe rash involving mucus membranes or skin necrosis: No Has patient had a PCN reaction that required hospitalization No Has patient had a PCN reaction occurring within the last 10 years: Yes If all of the above answers are  "NO", then may pr  . Nortriptyline Other (See Comments)    Sleep walking  . Brimonidine Other (See Comments)    Unknown reaction per MAR   . Cyclobenzaprine Other (See Comments)    Hallucinations  . Tizanidine Other (See Comments)    Hallucinations  . Gabapentin Rash    Family History  Problem Relation Age of Onset  . Stomach cancer Mother   . Hypertension Brother   . Heart attack Father     Social History Social History   Tobacco Use  . Smoking status: Former Smoker    Last attempt to quit: 08/30/2006    Years since quitting: 10.7  . Smokeless tobacco: Never Used  Substance Use Topics  . Alcohol use: Yes    Alcohol/week: 1.2 oz    Types: 2 Cans of beer per week    Comment: occasional use  . Drug use: No    Review of Systems Constitutional: Negative for fever.  Positive for mild nasal congestion. Cardiovascular: Negative for chest pain. Respiratory: Negative for shortness of breath. Gastrointestinal: Negative for abdominal pain Musculoskeletal: Moderate lower back pain All other ROS negative  ____________________________________________   PHYSICAL EXAM:  VITAL SIGNS: ED Triage Vitals  Enc Vitals Group     BP 06/13/17 0204 (!) 169/74     Pulse Rate 06/13/17 0204 62     Resp 06/13/17 0204 20     Temp 06/13/17 0204 98.7 F (37.1 C)     Temp Source 06/13/17 0204 Oral     SpO2 06/13/17 0203 (!) 86 %     Weight 06/13/17 0206 208 lb (94.3 kg)     Height 06/13/17 0206 5\' 8"  (1.727 m)     Head Circumference --      Peak Flow --      Pain Score 06/13/17 0242 8     Pain Loc --      Pain Edu? --      Excl. in GC? --    Constitutional: Alert and oriented. Well appearing and in no distress. Eyes: Normal exam ENT   Head: Normocephalic and atraumatic.   Mouth/Throat: Mucous membranes are moist. Cardiovascular: Normal rate, regular rhythm. Respiratory: Normal respiratory effort without tachypnea nor retractions. Breath sounds are clear Gastrointestinal:  Soft and nontender. No distention.   Musculoskeletal: Mild lumbar paraspinal tenderness to palpation.  Multiple surgical scars from prior back surgeries. Neurologic:  Normal speech and language. No gross focal neurologic deficits Skin:  Skin is warm, dry and intact.  Psychiatric: Mood and affect are  normal.     RADIOLOGY  X-ray negative for acute abnormality  ____________________________________________   INITIAL IMPRESSION / ASSESSMENT AND PLAN / ED COURSE  Pertinent labs & imaging results that were available during my care of the patient were reviewed by me and considered in my medical decision making (see chart for details).  Patient presents to the emergency department for lower back pain worse with movement.  Differential would include muscular skeletal pain, compression fracture, urinary tract infection, pyelonephritis.  Overall the patient appears well, nontender abdomen, no CVA tenderness.  Patient is lumbar tenderness appears to be mostly paraspinal.  We will obtain an x-ray as a precaution.  Patient's urinalysis has resulted showing too numerous to count white blood cells as well as many bacteria.  Patient states he frequently gets urinary tract infections last of which was approximately 4 months ago per patient.  We will start on Levaquin and send a urine culture.  We will dose 2 tablets of Norco for pain relief in the emergency department.  Patient agreeable to this plan of care.  X-ray negative for acute abnormality.  We will discharge with a short course of pain medication, antibiotics for his urinary tract infection and have the patient follow-up with his doctor.  Patient agreeable to plan.  ____________________________________________   FINAL CLINICAL IMPRESSION(S) / ED DIAGNOSES  Urinary tract infection Low back pain    Minna Antis, MD 06/13/17 [phone]

## 2017-06-15 LAB — URINE CULTURE: Culture: 80000 — AB

## 2017-06-24 ENCOUNTER — Emergency Department: Payer: Medicare Other

## 2017-06-24 ENCOUNTER — Inpatient Hospital Stay
Admission: EM | Admit: 2017-06-24 | Discharge: 2017-06-27 | DRG: 645 | Disposition: A | Discharge status: Skilled Nursing Facility | Payer: Medicare Other | Attending: Internal Medicine | Admitting: Internal Medicine

## 2017-06-24 ENCOUNTER — Other Ambulatory Visit: Payer: Self-pay

## 2017-06-24 ENCOUNTER — Encounter: Payer: Self-pay | Admitting: Emergency Medicine

## 2017-06-24 DIAGNOSIS — G629 Polyneuropathy, unspecified: Secondary | ICD-10-CM | POA: Diagnosis present

## 2017-06-24 DIAGNOSIS — E039 Hypothyroidism, unspecified: Secondary | ICD-10-CM | POA: Diagnosis present

## 2017-06-24 DIAGNOSIS — R296 Repeated falls: Secondary | ICD-10-CM | POA: Diagnosis present

## 2017-06-24 DIAGNOSIS — Z981 Arthrodesis status: Secondary | ICD-10-CM

## 2017-06-24 DIAGNOSIS — G8929 Other chronic pain: Secondary | ICD-10-CM | POA: Diagnosis present

## 2017-06-24 DIAGNOSIS — Z7982 Long term (current) use of aspirin: Secondary | ICD-10-CM

## 2017-06-24 DIAGNOSIS — Z888 Allergy status to other drugs, medicaments and biological substances status: Secondary | ICD-10-CM | POA: Diagnosis not present

## 2017-06-24 DIAGNOSIS — M19011 Primary osteoarthritis, right shoulder: Secondary | ICD-10-CM | POA: Diagnosis present

## 2017-06-24 DIAGNOSIS — R531 Weakness: Secondary | ICD-10-CM | POA: Diagnosis present

## 2017-06-24 DIAGNOSIS — I6529 Occlusion and stenosis of unspecified carotid artery: Secondary | ICD-10-CM | POA: Diagnosis present

## 2017-06-24 DIAGNOSIS — Z7902 Long term (current) use of antithrombotics/antiplatelets: Secondary | ICD-10-CM

## 2017-06-24 DIAGNOSIS — E871 Hypo-osmolality and hyponatremia: Secondary | ICD-10-CM | POA: Diagnosis present

## 2017-06-24 DIAGNOSIS — F4323 Adjustment disorder with mixed anxiety and depressed mood: Secondary | ICD-10-CM | POA: Diagnosis present

## 2017-06-24 DIAGNOSIS — R0689 Other abnormalities of breathing: Secondary | ICD-10-CM

## 2017-06-24 DIAGNOSIS — Z87891 Personal history of nicotine dependence: Secondary | ICD-10-CM | POA: Diagnosis not present

## 2017-06-24 DIAGNOSIS — Z8679 Personal history of other diseases of the circulatory system: Secondary | ICD-10-CM

## 2017-06-24 DIAGNOSIS — Z7951 Long term (current) use of inhaled steroids: Secondary | ICD-10-CM

## 2017-06-24 DIAGNOSIS — M542 Cervicalgia: Secondary | ICD-10-CM | POA: Diagnosis present

## 2017-06-24 DIAGNOSIS — I10 Essential (primary) hypertension: Secondary | ICD-10-CM | POA: Diagnosis present

## 2017-06-24 DIAGNOSIS — Z9981 Dependence on supplemental oxygen: Secondary | ICD-10-CM | POA: Diagnosis not present

## 2017-06-24 DIAGNOSIS — M545 Low back pain: Secondary | ICD-10-CM | POA: Diagnosis present

## 2017-06-24 DIAGNOSIS — Z79899 Other long term (current) drug therapy: Secondary | ICD-10-CM | POA: Diagnosis not present

## 2017-06-24 DIAGNOSIS — Z880 Allergy status to penicillin: Secondary | ICD-10-CM | POA: Diagnosis not present

## 2017-06-24 DIAGNOSIS — E222 Syndrome of inappropriate secretion of antidiuretic hormone: Secondary | ICD-10-CM | POA: Diagnosis present

## 2017-06-24 DIAGNOSIS — J449 Chronic obstructive pulmonary disease, unspecified: Secondary | ICD-10-CM | POA: Diagnosis present

## 2017-06-24 LAB — URINALYSIS, COMPLETE (UACMP) WITH MICROSCOPIC
Bilirubin Urine: NEGATIVE
Glucose, UA: NEGATIVE mg/dL
Hgb urine dipstick: NEGATIVE
Ketones, ur: NEGATIVE mg/dL
LEUKOCYTES UA: NEGATIVE
Nitrite: NEGATIVE
PH: 6 (ref 5.0–8.0)
Protein, ur: NEGATIVE mg/dL
SQUAMOUS EPITHELIAL / LPF: NONE SEEN
Specific Gravity, Urine: 1.012 (ref 1.005–1.030)

## 2017-06-24 LAB — CBC
HEMATOCRIT: 36 % — AB (ref 40.0–52.0)
HEMOGLOBIN: 12.1 g/dL — AB (ref 13.0–18.0)
MCH: 31.5 pg (ref 26.0–34.0)
MCHC: 33.6 g/dL (ref 32.0–36.0)
MCV: 93.6 fL (ref 80.0–100.0)
Platelets: 169 10*3/uL (ref 150–440)
RBC: 3.85 MIL/uL — ABNORMAL LOW (ref 4.40–5.90)
RDW: 15.4 % — AB (ref 11.5–14.5)
WBC: 6.6 10*3/uL (ref 3.8–10.6)

## 2017-06-24 LAB — BASIC METABOLIC PANEL
ANION GAP: 7 (ref 5–15)
BUN: 18 mg/dL (ref 6–20)
CHLORIDE: 85 mmol/L — AB (ref 101–111)
CO2: 32 mmol/L (ref 22–32)
Calcium: 8.9 mg/dL (ref 8.9–10.3)
Creatinine, Ser: 0.92 mg/dL (ref 0.61–1.24)
GFR calc Af Amer: >60 mL/min (ref >60)
GFR calc non Af Amer: >60 mL/min (ref >60)
GLUCOSE: 98 mg/dL (ref 65–99)
POTASSIUM: 4.7 mmol/L (ref 3.5–5.1)
Sodium: 124 mmol/L — ABNORMAL LOW (ref 135–145)

## 2017-06-24 LAB — TROPONIN I: Troponin I: <0.03 ng/mL (ref <0.03)

## 2017-06-24 LAB — OSMOLALITY: Osmolality: 273 mOsm/kg — ABNORMAL LOW (ref 275–295)

## 2017-06-24 LAB — GLUCOSE, CAPILLARY: GLUCOSE-CAPILLARY: 92 mg/dL (ref 65–99)

## 2017-06-24 LAB — BRAIN NATRIURETIC PEPTIDE: B Natriuretic Peptide: 228 pg/mL — ABNORMAL HIGH (ref 0.0–100.0)

## 2017-06-24 LAB — OSMOLALITY, URINE: Osmolality, Ur: 206 mOsm/kg — ABNORMAL LOW (ref 300–900)

## 2017-06-24 MED ORDER — IPRATROPIUM-ALBUTEROL 0.5-2.5 (3) MG/3ML IN SOLN
3.0000 mL | Freq: Once | RESPIRATORY_TRACT | Status: AC
  Administered 2017-06-24: 3 mL via RESPIRATORY_TRACT
  Filled 2017-06-24: qty 3

## 2017-06-24 MED ORDER — ONDANSETRON HCL 4 MG/2ML IJ SOLN: 4.0000 mg | Freq: Four times a day (QID) | INTRAMUSCULAR | Status: DC | PRN

## 2017-06-24 MED ORDER — ORAL CARE MOUTH RINSE
15.0000 mL | Freq: Two times a day (BID) | OROMUCOSAL | Status: DC
  Administered 2017-06-25 – 2017-06-26 (×4): 15 mL via OROMUCOSAL

## 2017-06-24 MED ORDER — OXYCODONE-ACETAMINOPHEN 5-325 MG PO TABS
1.0000 | ORAL_TABLET | Freq: Four times a day (QID) | ORAL | Status: DC | PRN
  Administered 2017-06-25 – 2017-06-27 (×5): 1 via ORAL
  Filled 2017-06-24 (×5): qty 1

## 2017-06-24 MED ORDER — SODIUM CHLORIDE 0.9 % IV SOLN
INTRAVENOUS | Status: DC
  Administered 2017-06-25 – 2017-06-27 (×6): via INTRAVENOUS

## 2017-06-24 MED ORDER — BUDESONIDE 0.5 MG/2ML IN SUSP
0.5000 mg | Freq: Two times a day (BID) | RESPIRATORY_TRACT | Status: DC
  Administered 2017-06-25 – 2017-06-27 (×5): 0.5 mg via RESPIRATORY_TRACT
  Filled 2017-06-24 (×5): qty 2

## 2017-06-24 MED ORDER — ACETAMINOPHEN 650 MG RE SUPP: 650.0000 mg | Freq: Four times a day (QID) | RECTAL | Status: DC | PRN

## 2017-06-24 MED ORDER — SODIUM CHLORIDE 1 G PO TABS
1.0000 g | ORAL_TABLET | Freq: Three times a day (TID) | ORAL | Status: DC
  Administered 2017-06-24 – 2017-06-27 (×7): 1 g via ORAL
  Filled 2017-06-24 (×13): qty 1

## 2017-06-24 MED ORDER — ACETAMINOPHEN 325 MG PO TABS
650.0000 mg | ORAL_TABLET | Freq: Four times a day (QID) | ORAL | Status: DC | PRN
  Administered 2017-06-27: 09:00:00 650 mg via ORAL
  Filled 2017-06-24: qty 2

## 2017-06-24 MED ORDER — SODIUM CHLORIDE 0.9 % IV SOLN
Freq: Once | INTRAVENOUS | Status: AC
  Administered 2017-06-24: 18:00:00 via INTRAVENOUS

## 2017-06-24 MED ORDER — ONDANSETRON HCL 4 MG PO TABS: 4.0000 mg | ORAL_TABLET | Freq: Four times a day (QID) | ORAL | Status: DC | PRN

## 2017-06-24 MED ORDER — ENOXAPARIN SODIUM 40 MG/0.4ML ~~LOC~~ SOLN
40.0000 mg | SUBCUTANEOUS | Status: DC
  Administered 2017-06-24 – 2017-06-26 (×3): 40 mg via SUBCUTANEOUS
  Filled 2017-06-24 (×3): qty 0.4

## 2017-06-24 MED ORDER — METHYLPREDNISOLONE SODIUM SUCC 125 MG IJ SOLR
125.0000 mg | Freq: Once | INTRAMUSCULAR | Status: AC
  Administered 2017-06-24: 125 mg via INTRAVENOUS
  Filled 2017-06-24: qty 2

## 2017-06-24 MED ORDER — IPRATROPIUM-ALBUTEROL 0.5-2.5 (3) MG/3ML IN SOLN: 3.0000 mL | Freq: Four times a day (QID) | RESPIRATORY_TRACT | Status: DC | PRN

## 2017-06-24 NOTE — ED Triage Notes (Addendum)
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 Pt states the back pain has been getting worse since last night, pt states he has been having trouble getting up and has been incontinent because he is unable to get up in time.  Pt states he feels the urge to urinate but cannot get up in time to get to the bathroom.  Pt states EMS had to come several times in the past 24 hours to help him get up and go to the bathroom.    Pt lives alone but has caregiver at times.  Pt does have swelling to lower extremities as well which he states is from eating too much salt recently

## 2017-06-24 NOTE — ED Triage Notes (Signed)
FIRST NURSE NOTE-here for back pain. Has called EMS X 3 today to help him get up and urinate because his back hurts.

## 2017-06-24 NOTE — H&P (Addendum)
[employer] - [place] at [facility]   PATIENT NAME: Chad Huffman    MR#:  [id number]  DATE OF BIRTH:  Apr 06, 1946  DATE OF ADMISSION:  06/24/2017  PRIMARY CARE PHYSICIAN: Dorothey Baseman, MD   REQUESTING/REFERRING PHYSICIAN: Dr. Daryel November.   CHIEF COMPLAINT:   Chief Complaint  Patient presents with  . Back Pain  . Weakness    HISTORY OF PRESENT ILLNESS:  Chad Huffman  is a 72 y.o. male with a known history of COPD, chronic back pain, degenerative disc disease, hypertension, peripheral neuropathy, depression who presents to the hospital due to back pain and weakness. Patient has chronic back pain but says his pain is gotten worse over the past 2-3 days. He denies any urinary incontinence, fecal incontinence, numbness tingling associated with his back pain. He presented to the ER and was noted to be in mild respiratory distress secondary to COPD and also noted to be acutely hyponatremic with a sodium 124. Hospitalist services were consulted to further treatment and evaluation. Patient denies any shortness of breath, cough, congestion, fever, chills or nausea, vomiting, diarrhea or any other associated symptoms presently.  PAST MEDICAL HISTORY:   Past Medical History:  Diagnosis Date  . Antalgic gait   . Carotid artery occlusion   . Chronic neck and back pain   . Complication of anesthesia    pt has had cervical fusion-limited neck flextion  . COPD (chronic obstructive pulmonary disease) (HCC)   . Depression   . DJD (degenerative joint disease)    Right shoulder  . Hypertension   . Lumbago   . Peripheral neuropathy     PAST SURGICAL HISTORY:   Past Surgical History:  Procedure Laterality Date  . ABDOMINAL AORTIC ANEURYSM REPAIR  2009   Endovascular AAA repair  . ABDOMINAL AORTIC ANEURYSM REPAIR    . CAROTID ANGIOGRAPHY  09/09/2016   Procedure: Carotid Angiography;  Surgeon: Nada Libman, MD;  Location: [facility] INVASIVE CV LAB;  Service: Cardiovascular;;   rt. carotid  . CARPAL TUNNEL RELEASE     bilateral CTS  . ESOPHAGOGASTRODUODENOSCOPY (EGD) WITH PROPOFOL N/A 09/28/2016   Procedure: ESOPHAGOGASTRODUODENOSCOPY (EGD) WITH PROPOFOL;  Surgeon: Midge Minium, MD;  Location: [facility] ENDOSCOPY;  Service: Endoscopy;  Laterality: N/A;  . HAMMER TOE SURGERY  12/10/2011   Procedure: HAMMER TOE CORRECTION;  Surgeon: Ernestene Kiel, DPM;  Location: [facility];  Service: Podiatry;  Laterality: Right;  Right hammertoe repair second right with 2.5x42 orthopro screw   . IR GENERIC HISTORICAL  04/05/2016   IR GASTROSTOMY TUBE MOD SED 04/05/2016 [facility]  . IR GENERIC HISTORICAL  05/12/2016   IR CM INJ ANY COLONIC TUBE W/FLUORO 05/12/2016 Malachy Moan, MD [facility]  . IR GENERIC HISTORICAL  05/26/2016   IR CM INJ ANY COLONIC TUBE W/FLUORO 05/26/2016 [facility]  . IR GENERIC HISTORICAL  05/28/2016   IR REPLC GASTRO/COLONIC TUBE PERCUT W/FLUORO 05/28/2016 Brayton El, PA-C [facility]  . IR GENERIC HISTORICAL  07/02/2016   IR GASTROSTOMY TUBE REMOVAL 07/02/2016 Brayton El, PA-C [facility]  . METATARSAL OSTEOTOMY  12/10/2011   Procedure: METATARSAL OSTEOTOMY;  Surgeon: Ernestene Kiel, DPM;  Location: [facility];  Service: Podiatry;  Laterality: Right;  Right Keller arthroplasty with size 5 silicone implant right great toe; second metatarsal osteotomy with 2.0x14 screw  . NECK SURGERY    . PERIPHERAL VASCULAR CATHETERIZATION N/A 02/11/2015   Procedure: Carotid Angiography;  Surgeon: Nada Libman, MD;  Location: [facility] INVASIVE CV  LAB;  Service: Cardiovascular;  Laterality: N/A;  . PILONIDAL CYST / SINUS EXCISION    . Rotary cuff    . SPINE SURGERY  09/15/11   Scar tissue removed- back  . SPINE SURGERY  09/15/11   Scar tissue removed- back  . TONSILLECTOMY    . TRACHEOSTOMY TUBE PLACEMENT N/A 03/03/2016   Procedure: TRACHEOSTOMY;  Surgeon: Geanie LoganPaul Bennett, MD;  Location: [facility] ORS;  Service: ENT;  Laterality: N/A;    SOCIAL  HISTORY:   Social History   Tobacco Use  . Smoking status: Former Smoker    Packs/day: 1.00    Years: 30.00    Pack years: 30.00    Types: Cigarettes    Last attempt to quit: 08/30/2006    Years since quitting: 10.8  . Smokeless tobacco: Never Used  Substance Use Topics  . Alcohol use: Yes    Alcohol/week: 1.2 oz    Types: 2 Cans of beer per week    Comment: occasional use    FAMILY HISTORY:   Family History  Problem Relation Age of Onset  . Stomach cancer Mother   . Hypertension Brother   . Heart attack Father     DRUG ALLERGIES:   Allergies  Allergen Reactions  . Ampicillin Swelling and Other (See Comments)    Unable to obtain enough information to answer additional questions about this medication.  Has patient had a PCN reaction causing immediate rash, facial/tongue/throat swelling, SOB or lightheadedness with hypotension: Yes Has patient had a PCN reaction causing severe rash involving mucus membranes or skin necrosis: No Has patient had a PCN reaction that required hospitalization No Has patient had a PCN reaction occurring within the last 10 years: Yes If all of the above answers are "NO", then may pr  . Nortriptyline Other (See Comments)    Sleep walking  . Brimonidine Other (See Comments)    Unknown reaction per MAR   . Cyclobenzaprine Other (See Comments)    Hallucinations  . Tizanidine Other (See Comments)    Hallucinations  . Gabapentin Rash    REVIEW OF SYSTEMS:   Review of Systems  Constitutional: Negative for chills, fever and weight loss.  HENT: Negative for congestion, nosebleeds and tinnitus.   Eyes: Negative for blurred vision, double vision and redness.  Respiratory: Negative for cough, hemoptysis, shortness of breath and wheezing.   Cardiovascular: Negative for chest pain, orthopnea, leg swelling and PND.  Gastrointestinal: Negative for abdominal pain, diarrhea, melena, nausea and vomiting.  Genitourinary: Negative for dysuria, hematuria  and urgency.  Musculoskeletal: Positive for back pain and falls. Negative for joint pain.  Neurological: Negative for dizziness, tingling, sensory change, focal weakness, seizures, weakness and headaches.  Endo/Heme/Allergies: Negative for polydipsia. Does not bruise/bleed easily.  Psychiatric/Behavioral: Negative for depression and memory loss. The patient is not nervous/anxious.   All other systems reviewed and are negative.   MEDICATIONS AT HOME:   Prior to Admission medications   Medication Sig Start Date End Date Taking? Authorizing Provider  aspirin EC 81 MG tablet Take 81 mg by mouth daily.    [provider]  baclofen (LIORESAL) 10 MG tablet Take 10 mg by mouth 3 (three) times daily as needed. 02/26/17   [provider]  budesonide (PULMICORT) 0.5 MG/2ML nebulizer solution Take 2 mLs (0.5 mg total) by nebulization 2 (two) times daily. 12/21/16   Merwyn KatosSimonds, [name] B, MD  clonazePAM (KLONOPIN) 0.5 MG tablet Take 1 tablet (0.5 mg total) by mouth 2 (two) times daily. 08/23/16  Katharina Caper, MD  clopidogrel (PLAVIX) 75 MG tablet Take 75 mg by mouth daily.    [provider]  folic acid (FOLVITE) 1 MG tablet Take 1 mg by mouth daily.     [provider]  gabapentin (NEURONTIN) 300 MG capsule Take 300 mg by mouth daily.    [provider]  HYDROcodone-acetaminophen (NORCO/VICODIN) 5-325 MG tablet Take 1 tablet by mouth every 4 (four) hours as needed. 06/13/17   Minna Antis, MD  ipratropium-albuterol (DUONEB) 0.5-2.5 (3) MG/3ML SOLN Take 3 mLs by nebulization every 6 (six) hours as needed. Dx:J44.9 12/21/16   Merwyn Katos, MD  ketoconazole (NIZORAL) 2 % shampoo Apply 1 application topically daily as needed.    [provider]  levothyroxine (SYNTHROID, LEVOTHROID) 100 MCG tablet Take 200 mcg by mouth daily before breakfast.     [provider]  levothyroxine (SYNTHROID, LEVOTHROID) 75 MCG tablet Take 1 tablet (75 mcg total) by  mouth daily before breakfast. Patient not taking: Reported on 06/13/2017 09/16/16   Enid Baas, MD  metoprolol tartrate (LOPRESSOR) 25 MG tablet Take 1 tablet (25 mg total) by mouth 2 (two) times daily. 07/27/16   Katharina Caper, MD  Multiple Vitamin (MULTIVITAMIN WITH MINERALS) TABS tablet Take 1 tablet by mouth daily.    [provider]  oxyCODONE (OXY IR/ROXICODONE) 5 MG immediate release tablet Take 1 tablet (5 mg total) by mouth every 8 (eight) hours as needed for moderate pain. 09/16/16   Enid Baas, MD  OXYGEN 4 L/min by Intratracheal route as needed (for shortness of breath).     [provider]  sertraline (ZOLOFT) 50 MG tablet Take 100 mg by mouth daily.     [provider]  SYMBICORT 160-4.5 MCG/ACT inhaler TAKE 2 PUFFS BY MOUTH TWICE A DAY 06/01/17   Merwyn Katos, MD  traMADol (ULTRAM) 50 MG tablet Take 1 tablet (50 mg total) by mouth every 6 (six) hours as needed. 03/13/17 03/13/18  Willy Eddy, MD      VITAL SIGNS:  Blood pressure (!) 207/87, pulse (!) 55, temperature 97.7 F (36.5 C), temperature source Oral, resp. rate 18, height 5\' 8"  (1.727 m), weight 94.3 kg (208 lb), SpO2 100 %.  PHYSICAL EXAMINATION:  Physical Exam  GENERAL:  72 y.o.-year-old patient lying in the bed lethargic but follows simple commands in NAD.  EYES: Pupils equal, round, reactive to light and accommodation. No scleral icterus. Extraocular muscles intact.  HEENT: Head atraumatic, normocephalic. Oropharynx and nasopharynx clear. No oropharyngeal erythema, moist oral mucosa  NECK:  Supple, no jugular venous distention. No thyroid enlargement, no tenderness.  LUNGS: Prolonged inspiratory and expiratory phase, no wheezing, rales, rhonchi. Negative use of accessory muscles. CARDIOVASCULAR: S1, S2 RRR. No murmurs, rubs, gallops, clicks.  ABDOMEN: Soft, nontender, nondistended. Bowel sounds present. No organomegaly or mass.  EXTREMITIES: No pedal edema, cyanosis,  or clubbing. + 2 pedal & radial pulses b/l.  Bruising on the right knee secondary to a fall.  NEUROLOGIC: Cranial nerves II through XII are intact. No focal Motor or sensory deficits appreciated b/l. Globally weak.  PSYCHIATRIC: The patient is alert and oriented x 3.  SKIN: No obvious rash, lesion, or ulcer.   LABORATORY PANEL:   CBC Recent Labs  Lab 06/24/17 1518  WBC 6.6  HGB 12.1*  HCT 36.0*  PLT 169   ------------------------------------------------------------------------------------------------------------------  Chemistries  Recent Labs  Lab 06/24/17 1518  NA 124*  K 4.7  CL 85*  CO2 32  GLUCOSE 98  BUN 18  CREATININE 0.92  CALCIUM 8.9   ------------------------------------------------------------------------------------------------------------------  Cardiac Enzymes Recent Labs  Lab 06/24/17 1518  TROPONINI <0.03   ------------------------------------------------------------------------------------------------------------------  RADIOLOGY:  Dg Chest 1 View  Result Date: 06/24/2017 CLINICAL DATA:  Weakness and dyspnea. EXAM: CHEST 1 VIEW COMPARISON:  09/26/2016 FINDINGS: The cardiac silhouette remains enlarged. Aortic atherosclerosis is again noted. There is persistent moderate elevation of the right hemidiaphragm with gas-filled colon located between diaphragm and liver, similar to the prior study. Minimal right basilar lung opacity along the diaphragm likely represents atelectasis. The left lung is clear. No sizable pleural effusion or pneumothorax is identified. Cervicothoracic spinal fusion and old left rib fractures are noted. IMPRESSION: Chronic elevation of the right hemidiaphragm with minimal adjacent atelectasis. No evidence of pneumonia or edema. Electronically Signed   By: Sebastian Ache M.D.   On: 06/24/2017 18:05     IMPRESSION AND PLAN:   72 year old male with past medical history of COPD, chronic back/neck pain, neuropathy, degenerative disc  disease, depression, essential hypertension who presents to the hospital due to back pain, weakness and noted to be acutely hyponatremic.   1. Hyponatremia-etiology unclear presently. Patient's sodium is 124 admission. -Questionable hypotonic hypovolemic versus SIADH. I will check a urine and serum osmolality. Gently hydrate the patient with IV fluids, follow sodium.  2. COPD-patient had no acute exacerbation evaluation. Patient was given IV Solu-Medrol and DuoNeb's by the ER physician and his respiratory distress is improved. -I will continue his DuoNeb's as needed.  Continue Pulmicort nebs.  3. Chronic back pain/recurrent falls-patient has previous history of degenerative disc disease and previous back surgery. Had a lumbar spine x-ray done prior to Christmas which showed no evidence of acute pathology. Back pain has improved since being in the ER. Hold off on further imaging, we'll get physical therapy consult to assess mobility.  4. Hypothyroidism-continue Synthroid.  5. Neuropathy-continue gabapentin.  6. Anxiety-continue Klonopin.  7. Essential hypertension-continue metoprolol.  8. Depression-continue Zoloft.   All the records are reviewed and case discussed with ED provider. Management plans discussed with the patient, family and they are in agreement.  CODE STATUS: Full Code  TOTAL TIME TAKING CARE OF THIS PATIENT: 45 minutes.    Houston Siren M.D on 06/24/2017 at 6:35 PM  Between 7am to 6pm - Pager - [phone]  After 6pm go to [email or web address] - password [email or web address] [facility]  [facility] [place]  Office  [phone]  CC: Primary care physician; Dorothey Baseman, MD

## 2017-06-24 NOTE — ED Notes (Signed)
Admitting Provider at bedside. 

## 2017-06-24 NOTE — ED Provider Notes (Signed)
[facility] Emergency Department Provider Note       Time seen: ----------------------------------------- 4:52 PM on 06/24/2017 -----------------------------------------   I have reviewed the triage vital signs and the nursing notes.  HISTORY   Chief Complaint Back Pain and Weakness   HPI Chad Huffman is a 72 y.o. male with a history of chronic neck and back pain, COPD, depression, hypertension and peripheral neuropathy who presents to the ED for back pain.  Patient states he has chronic back pain it has been getting worse at night.  Patient states having trouble getting up and has been somewhat incontinent at times.  Patient states he feels urge to urinate but cannot get up in timing of the bathroom.  He states EMS come several times in the past 24 hours to get him to get up and go to the bathroom.  He said swelling to the lower extremities.  Otherwise he denies any specific complaints.  Past Medical History:  Diagnosis Date  . Antalgic gait   . Carotid artery occlusion   . Chronic neck and back pain   . Complication of anesthesia    pt has had cervical fusion-limited neck flextion  . COPD (chronic obstructive pulmonary disease) (HCC)   . Depression   . DJD (degenerative joint disease)    Right shoulder  . Hypertension   . Lumbago   . Peripheral neuropathy     Patient Active Problem List   Diagnosis Date Noted  . Hematemesis without nausea   . Pressure injury of skin 09/14/2016  . Weakness 09/13/2016  . UTI (urinary tract infection) 09/13/2016  . Chronic diastolic CHF (congestive heart failure) (HCC) 09/13/2016  . COPD (chronic obstructive pulmonary disease) (HCC) 09/13/2016  . Carotid stenosis 09/09/2016  . Carotid artery stenosis 08/23/2016  . Pancytopenia (HCC) 08/23/2016  . Hyperkalemia 08/23/2016  . Leukocytosis 07/27/2016  . Thrombocytopenia (HCC) 07/27/2016  . Anemia 07/27/2016  . Status post insertion of percutaneous endoscopic  gastrostomy (PEG) tube (HCC) 05/30/2016  . Dysphagia, pharyngoesophageal phase 05/18/2016  . GERD without esophagitis 05/09/2016  . Bilateral lower extremity edema 05/09/2016  . Slow transit constipation 05/09/2016  . Depression with anxiety 05/09/2016  . Respiratory failure with hypoxia and hypercapnia (HCC) 07/22/2015  . Cervical vertebral fusion   . Depression   . Chronic neck and back pain   . COPD exacerbation (HCC)   . HCAP (healthcare-associated pneumonia)   . Congestive dilated cardiomyopathy (HCC)   . Syncope and collapse   . Arthritis of right hip 08/04/2014  . Essential hypertension 08/04/2014  . Hypothyroidism 08/04/2014  . Syncope 08/04/2014  . Fall 08/04/2014    Past Surgical History:  Procedure Laterality Date  . ABDOMINAL AORTIC ANEURYSM REPAIR  2009   Endovascular AAA repair  . ABDOMINAL AORTIC ANEURYSM REPAIR    . CAROTID ANGIOGRAPHY  09/09/2016   Procedure: Carotid Angiography;  Surgeon: Nada Libman, MD;  Location: [facility] INVASIVE CV LAB;  Service: Cardiovascular;;  rt. carotid  . CARPAL TUNNEL RELEASE     bilateral CTS  . ESOPHAGOGASTRODUODENOSCOPY (EGD) WITH PROPOFOL N/A 09/28/2016   Procedure: ESOPHAGOGASTRODUODENOSCOPY (EGD) WITH PROPOFOL;  Surgeon: Midge Minium, MD;  Location: [facility] ENDOSCOPY;  Service: Endoscopy;  Laterality: N/A;  . HAMMER TOE SURGERY  12/10/2011   Procedure: HAMMER TOE CORRECTION;  Surgeon: Ernestene Kiel, DPM;  Location: [facility];  Service: Podiatry;  Laterality: Right;  Right hammertoe repair second right with 2.5x42 orthopro screw   . IR GENERIC HISTORICAL  04/05/2016  IR GASTROSTOMY TUBE MOD SED 04/05/2016 [facility]  . IR GENERIC HISTORICAL  05/12/2016   IR CM INJ ANY COLONIC TUBE W/FLUORO 05/12/2016 Malachy Moan, MD [facility]  . IR GENERIC HISTORICAL  05/26/2016   IR CM INJ ANY COLONIC TUBE W/FLUORO 05/26/2016 [facility]  . IR GENERIC HISTORICAL  05/28/2016   IR REPLC GASTRO/COLONIC TUBE PERCUT W/FLUORO  05/28/2016 Brayton El, PA-C [facility]  . IR GENERIC HISTORICAL  07/02/2016   IR GASTROSTOMY TUBE REMOVAL 07/02/2016 Brayton El, PA-C [facility]  . METATARSAL OSTEOTOMY  12/10/2011   Procedure: METATARSAL OSTEOTOMY;  Surgeon: Ernestene Kiel, DPM;  Location: [facility];  Service: Podiatry;  Laterality: Right;  Right Keller arthroplasty with size 5 silicone implant right great toe; second metatarsal osteotomy with 2.0x14 screw  . NECK SURGERY    . PERIPHERAL VASCULAR CATHETERIZATION N/A 02/11/2015   Procedure: Carotid Angiography;  Surgeon: Nada Libman, MD;  Location: [facility] INVASIVE CV LAB;  Service: Cardiovascular;  Laterality: N/A;  . PILONIDAL CYST / SINUS EXCISION    . Rotary cuff    . SPINE SURGERY  09/15/11   Scar tissue removed- back  . SPINE SURGERY  09/15/11   Scar tissue removed- back  . TONSILLECTOMY    . TRACHEOSTOMY TUBE PLACEMENT N/A 03/03/2016   Procedure: TRACHEOSTOMY;  Surgeon: Geanie Logan, MD;  Location: [facility] ORS;  Service: ENT;  Laterality: N/A;    Allergies Ampicillin; Nortriptyline; Brimonidine; Cyclobenzaprine; Tizanidine; and Gabapentin  Social History Social History   Tobacco Use  . Smoking status: Former Smoker    Last attempt to quit: 08/30/2006    Years since quitting: 10.8  . Smokeless tobacco: Never Used  Substance Use Topics  . Alcohol use: Yes    Alcohol/week: 1.2 oz    Types: 2 Cans of beer per week    Comment: occasional use  . Drug use: No    Review of Systems Constitutional: Negative for fever. Cardiovascular: Negative for chest pain. Respiratory: Negative for shortness of breath. Gastrointestinal: Negative for abdominal pain, vomiting and diarrhea. Musculoskeletal: Positive for back pain Skin: Negative for rash. Neurological: Negative for headaches, positive for generalized weakness  All systems negative/normal/unremarkable except as stated in the HPI  ____________________________________________   PHYSICAL  EXAM:  VITAL SIGNS: ED Triage Vitals  Enc Vitals Group     BP 06/24/17 1517 (!) 157/75     Pulse Rate 06/24/17 1634 (!) 55     Resp 06/24/17 1517 18     Temp 06/24/17 1517 97.7 F (36.5 C)     Temp Source 06/24/17 1517 Oral     SpO2 06/24/17 1517 (!) 88 %     Weight 06/24/17 1514 208 lb (94.3 kg)     Height 06/24/17 1514 5\' 8"  (1.727 m)     Head Circumference --      Peak Flow --      Pain Score 06/24/17 1514 4     Pain Loc --      Pain Edu? --      Excl. in GC? --    Constitutional: Drowsy, but arouses easily.  No distress. Eyes: Conjunctivae are mildly injected. Normal extraocular movements. ENT   Head: Normocephalic and atraumatic.   Nose: No congestion/rhinnorhea.   Mouth/Throat: Mucous membranes are moist.   Neck: No stridor. Cardiovascular: Normal rate, regular rhythm. No murmurs, rubs, or gallops. Respiratory: Normal respiratory effort without tachypnea nor retractions. Breath sounds are clear and equal bilaterally. No wheezes/rales/rhonchi. Gastrointestinal: Soft and nontender.  Normal bowel sounds Musculoskeletal: Nontender with normal range of motion in extremities. No lower extremity tenderness nor edema. Neurologic:  Normal speech and language.  Generalized weakness, worse in the right leg.  Nothing focal. Skin:  Skin is warm, dry and intact. No rash noted. Psychiatric: Mood and affect are normal. Speech and behavior are normal.  ____________________________________________  EKG: Interpreted by me.  Sinus bradycardia with first-degree AV block, rate is 52 bpm, right bundle branch block, old ST segment changes in V1 and V2.  ____________________________________________  ED COURSE:  As part of my medical decision making, I reviewed the following data within the electronic MEDICAL RECORD NUMBER History obtained from family if available, nursing notes, old chart and ekg, as well as notes from prior ED visits. Patient presented for back pain, we will assess  with labs and imaging as indicated at this time.   Procedures ____________________________________________   LABS (pertinent positives/negatives)  Labs Reviewed  BASIC METABOLIC PANEL - Abnormal; Notable for the following components:      Result Value   Sodium 124 (*)    Chloride 85 (*)    All other components within normal limits  CBC - Abnormal; Notable for the following components:   RBC 3.85 (*)    Hemoglobin 12.1 (*)    HCT 36.0 (*)    RDW 15.4 (*)    All other components within normal limits  URINALYSIS, COMPLETE (UACMP) WITH MICROSCOPIC - Abnormal; Notable for the following components:   Color, Urine YELLOW (*)    APPearance CLEAR (*)    Bacteria, UA RARE (*)    All other components within normal limits  BRAIN NATRIURETIC PEPTIDE - Abnormal; Notable for the following components:   B Natriuretic Peptide 228.0 (*)    All other components within normal limits  BLOOD GAS, VENOUS - Abnormal; Notable for the following components:   pCO2, Ven 74 (*)    pO2, Ven 48.0 (*)    Bicarbonate 37.3 (*)    Acid-Base Excess 8.5 (*)    All other components within normal limits  TROPONIN I  GLUCOSE, CAPILLARY  CBG MONITORING, ED    RADIOLOGY Images were viewed by me  Chest x-ray is normal  ____________________________________________  DIFFERENTIAL DIAGNOSIS   Chronic pain, musculoskeletal pain, pyelonephritis, UTI, sciatica, cauda equina syndrome  FINAL ASSESSMENT AND PLAN  Back pain, weakness, hyponatremia, hypercarbia   Plan: Patient had presented for back pain and generalized weakness. Patient's labs were concerning for hyponatremia, and PCO2. Patient's imaging did not reveal any acute process.  We did give him duo nebs and steroids to treat his hypercarbia.  This is acute on chronic.  His sodium was also low at 124 for which she was given saline infusion.  He does have extensive weakness and will need physical therapy to work with him.  I am also concerned that he is  unsafe to live at home.   Emily FilbertWilliams, Jonathan E, MD   Note: This note was generated in part or whole with voice recognition software. Voice recognition is usually quite accurate but there are transcription errors that can and very often do occur. I apologize for any typographical errors that were not detected and corrected.     Emily FilbertWilliams, Jonathan E, MD 06/24/17 [phone]

## 2017-06-24 NOTE — ED Notes (Signed)
Patient voided . Post-void bladder scan showed 120 ml.

## 2017-06-24 NOTE — ED Notes (Signed)
Report was called to [employer] RN pt is ready for transfer.

## 2017-06-25 LAB — BASIC METABOLIC PANEL
Anion gap: 6 (ref 5–15)
BUN: 17 mg/dL (ref 6–20)
CO2: 30 mmol/L (ref 22–32)
Calcium: 9 mg/dL (ref 8.9–10.3)
Chloride: 92 mmol/L — ABNORMAL LOW (ref 101–111)
Creatinine, Ser: 0.85 mg/dL (ref 0.61–1.24)
GFR calc Af Amer: >60 mL/min (ref >60)
GLUCOSE: 105 mg/dL — AB (ref 65–99)
POTASSIUM: 4.9 mmol/L (ref 3.5–5.1)
Sodium: 128 mmol/L — ABNORMAL LOW (ref 135–145)

## 2017-06-25 MED ORDER — BISACODYL 5 MG PO TBEC
5.0000 mg | DELAYED_RELEASE_TABLET | Freq: Every day | ORAL | Status: DC | PRN
  Administered 2017-06-25: 22:00:00 5 mg via ORAL
  Filled 2017-06-25: qty 1

## 2017-06-25 MED ORDER — LEVOTHYROXINE SODIUM 100 MCG PO TABS
200.0000 ug | ORAL_TABLET | Freq: Every day | ORAL | Status: DC
  Administered 2017-06-26 – 2017-06-27 (×2): 200 ug via ORAL
  Filled 2017-06-25 (×2): qty 2

## 2017-06-25 MED ORDER — SENNA 8.6 MG PO TABS: 1.0000 | ORAL_TABLET | Freq: Every day | ORAL | Status: DC | PRN

## 2017-06-25 MED ORDER — METOPROLOL TARTRATE 25 MG PO TABS
25.0000 mg | ORAL_TABLET | Freq: Two times a day (BID) | ORAL | Status: DC
  Administered 2017-06-25 – 2017-06-27 (×4): 25 mg via ORAL
  Filled 2017-06-25 (×4): qty 1

## 2017-06-25 NOTE — Plan of Care (Addendum)
Pt admitted from the ED with low sodium of 124. Refused ivf. MD made aware. Salt tablets ordered. Pt said that he would only be here for one night and that he is on a salt restricted diet at home. Pt also said that if he knew he was going to be admitted he would not have come in. Pt lives alone and has abrasions on both his knees, he said that he has fallen about 10 times. Social work consult ordered for possible placement. During conversation with pt he would often loose his train of thought. From this writers opinion it would likely be unsafe for patient to return home considering his living arrangements.  Education: Knowledge of General Education information will improve 06/25/2017 0420 - Not Progressing by Geri SeminoleJackson, Andreika Vandagriff A, RN   Activity: Risk for activity intolerance will decrease 06/25/2017 0420 - Not Progressing by Geri SeminoleJackson, Trista Ciocca A, RN   Coping: Level of anxiety will decrease 06/25/2017 0420 - Not Progressing by Geri SeminoleJackson, Mirabel Ahlgren A, RN   Elimination: Will not experience complications related to urinary retention 06/25/2017 0420 - Progressing by Geri SeminoleJackson, Mete Purdum A, RN   Safety: Ability to remain free from injury will improve 06/25/2017 0420 - Progressing by Geri SeminoleJackson, Greysin Medlen A, RN   Skin Integrity: Risk for impaired skin integrity will decrease 06/25/2017 0420 - Progressing by Geri SeminoleJackson, Washington Whedbee A, RN

## 2017-06-25 NOTE — Progress Notes (Signed)
[employer] [facility] - [place] [facility]   PATIENT NAME: Chad Huffman    MR#:  [id number]  DATE OF BIRTH:  [birth year]  SUBJECTIVE:  CHIEF COMPLAINT:   Chief Complaint  Patient presents with  . Back Pain  . Weakness   Generalized weakness and chronic chronic back pain. REVIEW OF SYSTEMS:  Review of Systems  Constitutional: Positive for malaise/fatigue. Negative for chills and fever.  HENT: Negative for sore throat.   Eyes: Negative for blurred vision and double vision.  Respiratory: Negative for cough, hemoptysis, shortness of breath, wheezing and stridor.   Cardiovascular: Negative for chest pain, palpitations, orthopnea and leg swelling.  Gastrointestinal: Negative for abdominal pain, blood in stool, diarrhea, melena, nausea and vomiting.  Genitourinary: Negative for dysuria, flank pain and hematuria.  Musculoskeletal: Positive for back pain. Negative for joint pain.  Skin: Negative for rash.  Neurological: Positive for weakness. Negative for dizziness, sensory change, focal weakness, seizures, loss of consciousness and headaches.  Endo/Heme/Allergies: Negative for polydipsia.  Psychiatric/Behavioral: Negative for depression. The patient is not nervous/anxious.     DRUG ALLERGIES:   Allergies  Allergen Reactions  . Ampicillin Swelling and Other (See Comments)    Unable to obtain enough information to answer additional questions about this medication.  Has patient had a PCN reaction causing immediate rash, facial/tongue/throat swelling, SOB or lightheadedness with hypotension: Yes Has patient had a PCN reaction causing severe rash involving mucus membranes or skin necrosis: No Has patient had a PCN reaction that required hospitalization No Has patient had a PCN reaction occurring within the last 10 years: Yes If all of the above answers are "NO", then may pr  . Nortriptyline Other (See Comments)    Sleep walking  . Brimonidine Other (See Comments)    Unknown  reaction per MAR   . Cyclobenzaprine Other (See Comments)    Hallucinations  . Tizanidine Other (See Comments)    Hallucinations  . Gabapentin Rash   VITALS:  Blood pressure (!) 102/54, pulse 84, temperature 98.4 F (36.9 C), temperature source Oral, resp. rate 20, height 5\' 8"  (1.727 m), weight 208 lb (94.3 kg), SpO2 92 %. PHYSICAL EXAMINATION:  Physical Exam  Constitutional: He is oriented to person, place, and time and well-developed, well-nourished, and in no distress.  HENT:  Head: Normocephalic.  Mouth/Throat: Oropharynx is clear and moist.  Eyes: Conjunctivae and EOM are normal. Pupils are equal, round, and reactive to light. No scleral icterus.  Neck: Normal range of motion. Neck supple. No JVD present. No tracheal deviation present.  Cardiovascular: Normal rate, regular rhythm and normal heart sounds. Exam reveals no gallop.  No murmur heard. Pulmonary/Chest: Effort normal and breath sounds normal. No respiratory distress. He has no wheezes. He has no rales.  Abdominal: Soft. Bowel sounds are normal. He exhibits no distension. There is no tenderness. There is no rebound.  Musculoskeletal: Normal range of motion. He exhibits no edema or tenderness.  Neurological: He is alert and oriented to person, place, and time. No cranial nerve deficit.  Skin: No rash noted. No erythema.  Psychiatric: Affect normal.   LABORATORY PANEL:  Male CBC Recent Labs  Lab 06/24/17 1518  WBC 6.6  HGB 12.1*  HCT 36.0*  PLT 169   ------------------------------------------------------------------------------------------------------------------ Chemistries  Recent Labs  Lab 06/25/17 0921  NA 128*  K 4.9  CL 92*  CO2 30  GLUCOSE 105*  BUN 17  CREATININE 0.85  CALCIUM 9.0   RADIOLOGY:  Dg Chest 1 View  Result Date: 06/24/2017 CLINICAL DATA:  Weakness and dyspnea. EXAM: CHEST 1 VIEW COMPARISON:  09/26/2016 FINDINGS: The cardiac silhouette remains enlarged. Aortic atherosclerosis is  again noted. There is persistent moderate elevation of the right hemidiaphragm with gas-filled colon located between diaphragm and liver, similar to the prior study. Minimal right basilar lung opacity along the diaphragm likely represents atelectasis. The left lung is clear. No sizable pleural effusion or pneumothorax is identified. Cervicothoracic spinal fusion and old left rib fractures are noted. IMPRESSION: Chronic elevation of the right hemidiaphragm with minimal adjacent atelectasis. No evidence of pneumonia or edema. Electronically Signed   By: Sebastian AcheAllen  Grady M.D.   On: 06/24/2017 18:05   ASSESSMENT AND PLAN:   72 year old male with past medical history of COPD, chronic back/neck pain, neuropathy, degenerative disc disease, depression, essential hypertension who presents to the hospital due to back pain, weakness and noted to be acutely hyponatremic.   1. Hyponatremia-etiology unclear presently. Patient's sodium is 124 admission. Possible SIADH.  Continue normal saline IV and salt tablets, follow-up sodium level.  2. COPD-patient had no acute exacerbation evaluation. Continue his DuoNeb's as needed.  Continue Pulmicort nebs.  3. Chronic back pain/recurrent falls-patient has previous history of degenerative disc disease and previous back surgery. Had a lumbar spine x-ray done prior to Christmas which showed no evidence of acute pathology. Back pain has improved since being in the ER. Hold off on further imaging.  4. Hypothyroidism-continue Synthroid.  5. Neuropathy-continue gabapentin.  6. Anxiety-continue Klonopin.  7. Essential hypertension-continue metoprolol.  8. Depression-continue Zoloft.  Generalized weakness.  PT evaluation suggest SNF. All the records are reviewed and case discussed with Care Management/Social Worker. Management plans discussed with the patient, family and they are in agreement.  CODE STATUS: Full Code  TOTAL TIME TAKING CARE OF THIS PATIENT: 36  minutes.   More than 50% of the time was spent in counseling/coordination of care: YES  POSSIBLE D/C IN 3 DAYS, DEPENDING ON CLINICAL CONDITION.   Shaune PollackQing Taleigha Pinson M.D on 06/25/2017 at 2:24 PM  Between 7am to 6pm - Pager - [phone]  After 6pm go to [email or web address] - Therapist, nutritionalpassword EPAS [facility]  [facility]  Hospitalists

## 2017-06-25 NOTE — Evaluation (Signed)
Physical Therapy Evaluation Patient Details Name: Chad Huffman MRN: [id number] DOB: 1946-03-29 Today's Date: 06/25/2017   History of Present Illness  72 yo male with onset of aggravated back pain was admitted and noted low Na+, respiratory distress.  PMHx:  cervical fusion, rib fractures, COPD, falls, PN  Clinical Impression  Pt is up to side of bed with PT and very awkward and incapable of moving in any direction without assistance.  He is unsafe with his choices and noted his poor tolerance for a few steps and standing despite his insistence that he should try to walk until he fell.  Pt is expecting to leave the hosp Monday to go to the neurologist, and is not able to make a car transfer safely to attempt this.  Will continue PT and recommend SNF although per pt's previous notes he is likely to refuse this.  Focus his PT on standing balance control and strength/ROM to legs.    Follow Up Recommendations SNF    Equipment Recommendations  None recommended by PT    Recommendations for Other Services       Precautions / Restrictions Precautions Precautions: Fall Precaution Comments: on O2 Restrictions Weight Bearing Restrictions: No      Mobility  Bed Mobility Overal bed mobility: Needs Assistance Bed Mobility: Supine to Sit;Sit to Supine     Supine to sit: Mod assist;Max assist Sit to supine: Mod assist;Max assist   General bed mobility comments: pt cannot use UE's effectively to get into or out of bed  Transfers Overall transfer level: Needs assistance Equipment used: Rolling walker (2 wheeled);1 person hand held assist Transfers: Sit to/from Stand Sit to Stand: Mod assist;From elevated surface         General transfer comment: pt tries to use UE's on his walker instead of bed due to stiffness and weakness in arms  Ambulation/Gait Ambulation/Gait assistance: Mod assist Ambulation Distance (Feet): 4 Feet Assistive device: Rolling walker (2 wheeled);1 person hand held  assist Gait Pattern/deviations: Step-to pattern;Decreased stride length;Wide base of support;Trunk flexed;Shuffle Gait velocity: reduced Gait velocity interpretation: Below normal speed for age/gender General Gait Details: poor ability to step and cannot lift L leg to step sideways either direction very well  Stairs            Wheelchair Mobility    Modified Rankin (Stroke Patients Only)       Balance Overall balance assessment: Needs assistance;History of Falls Sitting-balance support: Feet supported;Bilateral upper extremity supported Sitting balance-Leahy Scale: Fair     Standing balance support: Bilateral upper extremity supported;During functional activity Standing balance-Leahy Scale: Poor                               Pertinent Vitals/Pain Pain Assessment: Faces Faces Pain Scale: Hurts little more Pain Location: mid back with extension Pain Descriptors / Indicators: Sore;Tightness Pain Intervention(s): Monitored during session;Repositioned;Limited activity within patient's tolerance    Home Living Family/patient expects to be discharged to:: Private residence(crawling on the floor) Living Arrangements: Alone Available Help at Discharge: Family;Friend(s);Home health Type of Home: House Home Access: Stairs to enter Entrance Stairs-Rails: Right;Left;Can reach both Entrance Stairs-Number of Steps: 4 Home Layout: One level Home Equipment: Walker - 2 wheels;Wheelchair - manual      Prior Function Level of Independence: Needs assistance   Gait / Transfers Assistance Needed: Pt blames his legs giving out for falls and has had many in recent months.  ADL's /  Homemaking Assistance Needed: has family assist dropping in with food and caring for him  Comments: pt is clearly home unsafely as evidenced by many falls and his unsafe judgment about how to move and what is reasonable     Hand Dominance   Dominant Hand: Right    Extremity/Trunk  Assessment   Upper Extremity Assessment Upper Extremity Assessment: Generalized weakness    Lower Extremity Assessment Lower Extremity Assessment: Generalized weakness    Cervical / Trunk Assessment Cervical / Trunk Assessment: Kyphotic  Communication   Communication: No difficulties  Cognition Arousal/Alertness: Awake/alert Behavior During Therapy: Anxious;Impulsive Overall Cognitive Status: No family/caregiver present to determine baseline cognitive functioning                                 General Comments: Pt willing to attempt unsafe movement and asked to walk to door and PT should just "push him up" when he tries to fall      General Comments General comments (skin integrity, edema, etc.): multiple areas of skin abrasion from his falls at home, poor standing control of knees and sits without warning    Exercises     Assessment/Plan    PT Assessment Patient needs continued PT services  PT Problem List Decreased strength;Decreased range of motion;Decreased activity tolerance;Decreased balance;Decreased mobility;Decreased coordination;Decreased cognition;Decreased knowledge of use of DME;Decreased safety awareness;Cardiopulmonary status limiting activity;Decreased skin integrity       PT Treatment Interventions DME instruction;Gait training;Stair training;Functional mobility training;Therapeutic activities;Therapeutic exercise;Balance training;Neuromuscular re-education;Cognitive remediation;Patient/family education    PT Goals (Current goals can be found in the Care Plan section)  Acute Rehab PT Goals Patient Stated Goal: to get up and walk to the door PT Goal Formulation: With patient Time For Goal Achievement: 07/09/17 Potential to Achieve Goals: Good    Frequency Min 2X/week   Barriers to discharge Inaccessible home environment;Decreased caregiver support home alone with stairs and no support for gait which led to falls    Co-evaluation                AM-PAC PT "6 Clicks" Daily Activity  Outcome Measure Difficulty turning over in bed (including adjusting bedclothes, sheets and blankets)?: Unable Difficulty moving from lying on back to sitting on the side of the bed? : Unable Difficulty sitting down on and standing up from a chair with arms (e.g., wheelchair, bedside commode, etc,.)?: Unable Help needed moving to and from a bed to chair (including a wheelchair)?: A Lot Help needed walking in hospital room?: A Lot Help needed climbing 3-5 steps with a railing? : Total 6 Click Score: 8    End of Session Equipment Utilized During Treatment: Gait belt;Oxygen Activity Tolerance: Patient limited by fatigue Patient left: in bed;with call bell/phone within reach;with bed alarm set Nurse Communication: Mobility status PT Visit Diagnosis: Unsteadiness on feet (R26.81);Muscle weakness (generalized) (M62.81);Difficulty in walking, not elsewhere classified (R26.2);History of falling (Z91.81)    Time: 1610-9604 PT Time Calculation (min) (ACUTE ONLY): 32 min   Charges:   PT Evaluation $PT Eval Moderate Complexity: 1 Mod PT Treatments $Therapeutic Activity: 8-22 mins   PT G Codes:   PT G-Codes **NOT FOR INPATIENT CLASS** Functional Assessment Tool Used: AM-PAC 6 Clicks Basic Mobility    Ivar Drape 06/25/2017, 11:33 AM   Samul Dada, PT MS Acute Rehab Dept. Number: [facility] [phone] and [facility] [phone] Ascom [phone]

## 2017-06-25 NOTE — Clinical Social Work Note (Addendum)
CSW received consult that the patient cannot care for himself. CSW is watching for PT recommendation. CSW will assess when able.  Chad PonderKaren Martha Cashel Bellina, MSW, Theresia MajorsLCSWA [phone]

## 2017-06-26 DIAGNOSIS — F4323 Adjustment disorder with mixed anxiety and depressed mood: Secondary | ICD-10-CM

## 2017-06-26 LAB — BASIC METABOLIC PANEL
Anion gap: 8 (ref 5–15)
BUN: 27 mg/dL — AB (ref 6–20)
CO2: 29 mmol/L (ref 22–32)
Calcium: 8.7 mg/dL — ABNORMAL LOW (ref 8.9–10.3)
Chloride: 95 mmol/L — ABNORMAL LOW (ref 101–111)
Creatinine, Ser: 0.97 mg/dL (ref 0.61–1.24)
GFR calc Af Amer: >60 mL/min (ref >60)
GLUCOSE: 92 mg/dL (ref 65–99)
POTASSIUM: 4.6 mmol/L (ref 3.5–5.1)
Sodium: 132 mmol/L — ABNORMAL LOW (ref 135–145)

## 2017-06-26 MED ORDER — ASPIRIN EC 81 MG PO TBEC
81.0000 mg | DELAYED_RELEASE_TABLET | Freq: Every day | ORAL | Status: DC
  Administered 2017-06-27: 10:00:00 81 mg via ORAL
  Filled 2017-06-26: qty 1

## 2017-06-26 MED ORDER — CLOPIDOGREL BISULFATE 75 MG PO TABS
75.0000 mg | ORAL_TABLET | Freq: Every day | ORAL | Status: DC
  Administered 2017-06-27: 75 mg via ORAL
  Filled 2017-06-26: qty 1

## 2017-06-26 MED ORDER — CLONAZEPAM 0.5 MG PO TABS: 0.2500 mg | ORAL_TABLET | Freq: Every day | ORAL | Status: DC

## 2017-06-26 MED ORDER — SERTRALINE HCL 50 MG PO TABS
100.0000 mg | ORAL_TABLET | Freq: Every day | ORAL | Status: DC
  Administered 2017-06-27: 10:00:00 100 mg via ORAL
  Filled 2017-06-26: qty 2

## 2017-06-26 MED ORDER — MAGNESIUM HYDROXIDE 400 MG/5ML PO SUSP
30.0000 mL | Freq: Every day | ORAL | Status: DC | PRN
  Filled 2017-06-26: qty 30

## 2017-06-26 MED ORDER — AMLODIPINE BESYLATE 5 MG PO TABS
5.0000 mg | ORAL_TABLET | Freq: Every day | ORAL | Status: DC
  Administered 2017-06-26 – 2017-06-27 (×2): 5 mg via ORAL
  Filled 2017-06-26 (×2): qty 1

## 2017-06-26 MED ORDER — ALPRAZOLAM 0.25 MG PO TABS
0.2500 mg | ORAL_TABLET | Freq: Three times a day (TID) | ORAL | Status: DC | PRN
Start: 2017-06-26 — End: 2017-06-26
  Administered 2017-06-26: 15:00:00 0.25 mg via ORAL
  Filled 2017-06-26: qty 1

## 2017-06-26 MED ORDER — FOLIC ACID 1 MG PO TABS
1.0000 mg | ORAL_TABLET | Freq: Every day | ORAL | Status: DC
  Administered 2017-06-27: 1 mg via ORAL
  Filled 2017-06-26: qty 1

## 2017-06-26 MED ORDER — HYDRALAZINE HCL 20 MG/ML IJ SOLN
10.0000 mg | Freq: Four times a day (QID) | INTRAMUSCULAR | Status: DC | PRN
  Administered 2017-06-26: 10 mg via INTRAVENOUS
  Filled 2017-06-26: qty 1

## 2017-06-26 MED ORDER — CLONAZEPAM 0.5 MG PO TABS
0.2500 mg | ORAL_TABLET | Freq: Two times a day (BID) | ORAL | Status: DC
  Administered 2017-06-26 – 2017-06-27 (×2): 0.25 mg via ORAL
  Filled 2017-06-26 (×2): qty 1

## 2017-06-26 NOTE — Progress Notes (Signed)
Pt c/o being anxious. Asked for anxiety medication. MD notified.
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
## 2017-06-26 NOTE — NC FL2 (Signed)
[place] MEDICAID FL2 LEVEL OF CARE SCREENING TOOL     IDENTIFICATION  Patient Name: Chad LabradorDanny L Harling Birthdate: 1945-12-23 Sex: male Admission Date (Current Location): 06/24/2017  [place]   Facility and Address:  [facility], [address], [place], [place] [address]      Provider Number: [id number]  Attending Physician Name and Address:  Shaune Pollackhen, Qing, MD  Relative Name and Phone Number:  Ida RogueVincent Schulenburg (brother) [phone]    Current Level of Care: Hospital Recommended Level of Care: Skilled Nursing Facility Prior Approval Number:    Date Approved/Denied:   PASRR Number: [id number]  Discharge Plan: SNF    Current Diagnoses: Patient Active Problem List   Diagnosis Date Noted  . Hyponatremia 06/24/2017  . Hematemesis without nausea   . Pressure injury of skin 09/14/2016  . Weakness 09/13/2016  . UTI (urinary tract infection) 09/13/2016  . Chronic diastolic CHF (congestive heart failure) ([facility]) 09/13/2016  . COPD (chronic obstructive pulmonary disease) ([facility]) 09/13/2016  . Carotid stenosis 09/09/2016  . Carotid artery stenosis 08/23/2016  . Pancytopenia ([facility]) 08/23/2016  . Hyperkalemia 08/23/2016  . Leukocytosis 07/27/2016  . Thrombocytopenia ([facility]) 07/27/2016  . Anemia 07/27/2016  . 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 Dysphagia, pharyngoesophageal phase 05/18/2016  . GERD without esophagitis 05/09/2016  . Bilateral lower extremity edema 05/09/2016  . Slow transit constipation 05/09/2016  . Depression with anxiety 05/09/2016  . Respiratory failure with hypoxia and hypercapnia ([facility]) 07/22/2015  . Cervical vertebral fusion   . Depression   . Chronic neck and back pain   . COPD exacerbation ([facility])   . HCAP (healthcare-associated pneumonia)   . Congestive dilated cardiomyopathy ([facility])   . Syncope and collapse   . Arthritis of right hip 08/04/2014  .  Essential hypertension 08/04/2014  . Hypothyroidism 08/04/2014  . Syncope 08/04/2014  . Fall 08/04/2014    Orientation RESPIRATION BLADDER Height & Weight     Self, Time, Situation, Place  O2(2L o2 acute) Continent Weight: 208 lb (94.3 kg) Height:  5\' 8"  (172.7 cm)  BEHAVIORAL SYMPTOMS/MOOD NEUROLOGICAL BOWEL NUTRITION STATUS      Continent    AMBULATORY STATUS COMMUNICATION OF NEEDS Skin   Extensive Assist Verbally Normal                       Personal Care Assistance Level of Assistance  Bathing, Feeding, Dressing Bathing Assistance: Limited assistance Feeding assistance: Independent Dressing Assistance: Limited assistance     Functional Limitations Info             SPECIAL CARE FACTORS FREQUENCY  PT (By licensed PT)     PT Frequency: 5/week              Contractures Contractures Info: Not present    Additional Factors Info  Code Status, Allergies, Psychotropic Code Status Info: Full Allergies Info: Ampicillin, Nortriptyline, Brimonidine, Cyclobenzaprine, Tizanidine, Gabapentin Psychotropic Info: Klonopin, Zoloft         Current Medications (06/26/2017):  This is the current hospital active medication list Current Facility-Administered Medications  Medication Dose Route Frequency Provider Last Rate Last Dose  . 0.9 %  sodium chloride infusion   Intravenous Continuous Shaune Pollackhen, Qing, MD 100 mL/hr at 06/26/17 [phone]    . acetaminophen (TYLENOL) tablet 650 mg  650 mg Oral Q6H PRN [place] SirenSainani, Vivek J, MD       Or  . acetaminophen (TYLENOL) suppository 650 mg  650 mg Rectal Q6H PRN [place] Siren, MD      . ALPRAZolam Prudy Feeler) tablet 0.25 mg  0.25 mg Oral TID PRN Shaune Pollack, MD   0.25 mg at 06/26/17 1522  . amLODipine (NORVASC) tablet 5 mg  5 mg Oral Daily Shaune Pollack, MD   5 mg at 06/26/17 1258  . bisacodyl (DULCOLAX) EC tablet 5 mg  5 mg Oral Daily PRN Shaune Pollack, MD   5 mg at 06/25/17 2221  . budesonide (PULMICORT) nebulizer solution 0.5 mg  0.5 mg Nebulization  BID Houston Siren, MD   0.5 mg at 06/26/17 0759  . enoxaparin (LOVENOX) injection 40 mg  40 mg Subcutaneous Q24H Houston Siren, MD   40 mg at 06/25/17 2222  . hydrALAZINE (APRESOLINE) injection 10 mg  10 mg Intravenous Q6H PRN Shaune Pollack, MD   10 mg at 06/26/17 1259  . ipratropium-albuterol (DUONEB) 0.5-2.5 (3) MG/3ML nebulizer solution 3 mL  3 mL Nebulization Q6H PRN [place] Siren, MD      . levothyroxine (SYNTHROID, LEVOTHROID) tablet 200 mcg  200 mcg Oral QAC breakfast Marguarite Arbour, MD   200 mcg at 06/26/17 0746  . magnesium hydroxide (MILK OF MAGNESIA) suspension 30 mL  30 mL Oral Daily PRN Shaune Pollack, MD      . MEDLINE mouth rinse  15 mL Mouth Rinse BID Shaune Pollack, MD   15 mL at 06/26/17 0953  . metoprolol tartrate (LOPRESSOR) tablet 25 mg  25 mg Oral BID Marguarite Arbour, MD   25 mg at 06/26/17 0746  . ondansetron (ZOFRAN) tablet 4 mg  4 mg Oral Q6H PRN [place] Siren, MD       Or  . ondansetron (ZOFRAN) injection 4 mg  4 mg Intravenous Q6H PRN [place] Siren, MD      . oxyCODONE-acetaminophen (PERCOCET/ROXICET) 5-325 MG per tablet 1 tablet  1 tablet Oral Q6H PRN Cammy Copa, MD   1 tablet at 06/25/17 2224  . senna (SENOKOT) tablet 8.6 mg  1 tablet Oral Daily PRN Shaune Pollack, MD      . sodium chloride tablet 1 g  1 g Oral TID WC Cammy Copa, MD   1 g at 06/26/17 1522     Discharge Medications: Please see discharge summary for a list of discharge medications.  Relevant Imaging Results:  Relevant Lab Results:   Additional Information SS# [id number]  Judi Cong, LCSW

## 2017-06-26 NOTE — Progress Notes (Signed)
[facility] [place] at [facility]   PATIENT NAME: Carmelina PaddockDanny Binion    MR#:  [id number]  DATE OF BIRTH:  08/13/1945  SUBJECTIVE:  CHIEF COMPLAINT:   Chief Complaint  Patient presents with  . Back Pain  . Weakness   Generalized weakness and chronic chronic back pain.  Depression and anxiety. REVIEW OF SYSTEMS:  Review of Systems  Constitutional: Positive for malaise/fatigue. Negative for chills and fever.  HENT: Negative for sore throat.   Eyes: Negative for blurred vision and double vision.  Respiratory: Negative for cough, hemoptysis, shortness of breath, wheezing and stridor.   Cardiovascular: Negative for chest pain, palpitations, orthopnea and leg swelling.  Gastrointestinal: Negative for abdominal pain, blood in stool, diarrhea, melena, nausea and vomiting.  Genitourinary: Negative for dysuria, flank pain and hematuria.  Musculoskeletal: Positive for back pain. Negative for joint pain.  Skin: Negative for rash.  Neurological: Positive for weakness. Negative for dizziness, sensory change, focal weakness, seizures, loss of consciousness and headaches.  Endo/Heme/Allergies: Negative for polydipsia.  Psychiatric/Behavioral: Positive for depression. The patient is nervous/anxious.     DRUG ALLERGIES:   Allergies  Allergen Reactions  . Ampicillin Swelling and Other (See Comments)    Unable to obtain enough information to answer additional questions about this medication.  Has patient had a PCN reaction causing immediate rash, facial/tongue/throat swelling, SOB or lightheadedness with hypotension: Yes Has patient had a PCN reaction causing severe rash involving mucus membranes or skin necrosis: No Has patient had a PCN reaction that required hospitalization No Has patient had a PCN reaction occurring within the last 10 years: Yes If all of the above answers are "NO", then may pr  . Nortriptyline Other (See Comments)    Sleep walking  . Brimonidine Other (See  Comments)    Unknown reaction per MAR   . Cyclobenzaprine Other (See Comments)    Hallucinations  . Tizanidine Other (See Comments)    Hallucinations  . Gabapentin Rash   VITALS:  Blood pressure (!) 194/90, pulse 67, temperature 98 F (36.7 C), temperature source Oral, resp. rate 20, height 5\' 8"  (1.727 m), weight 208 lb (94.3 kg), SpO2 98 %. PHYSICAL EXAMINATION:  Physical Exam  Constitutional: He is oriented to person, place, and time and well-developed, well-nourished, and in no distress.  HENT:  Head: Normocephalic.  Mouth/Throat: Oropharynx is clear and moist.  Eyes: Conjunctivae and EOM are normal. Pupils are equal, round, and reactive to light. No scleral icterus.  Neck: Normal range of motion. Neck supple. No JVD present. No tracheal deviation present.  Cardiovascular: Normal rate, regular rhythm and normal heart sounds. Exam reveals no gallop.  No murmur heard. Pulmonary/Chest: Effort normal and breath sounds normal. No respiratory distress. He has no wheezes. He has no rales.  Abdominal: Soft. Bowel sounds are normal. He exhibits no distension. There is no tenderness. There is no rebound.  Musculoskeletal: Normal range of motion. He exhibits no edema or tenderness.  Neurological: He is alert and oriented to person, place, and time. No cranial nerve deficit.  Skin: No rash noted. No erythema.  Psychiatric: Affect normal.   LABORATORY PANEL:  Male CBC Recent Labs  Lab 06/24/17 1518  WBC 6.6  HGB 12.1*  HCT 36.0*  PLT 169   ------------------------------------------------------------------------------------------------------------------ Chemistries  Recent Labs  Lab 06/26/17 0358  NA 132*  K 4.6  CL 95*  CO2 29  GLUCOSE 92  BUN 27*  CREATININE 0.97  CALCIUM 8.7*   RADIOLOGY:  No  results found. ASSESSMENT AND PLAN:   72 year old male with past medical history of COPD, chronic back/neck pain, neuropathy, degenerative disc disease, depression, essential  hypertension who presents to the hospital due to back pain, weakness and noted to be acutely hyponatremic.   1. Hyponatremia-etiology unclear presently. Patient's sodium is 124 admission. Possible SIADH.  Na 132 today. Improving with normal saline IV and salt tablets, follow-up sodium level.  2. COPD-patient had no acute exacerbation evaluation. Continue his DuoNeb's as needed.  Continue Pulmicort nebs.  3. Chronic back pain/recurrent falls-patient has previous history of degenerative disc disease and previous back surgery. Had a lumbar spine x-ray done prior to Christmas which showed no evidence of acute pathology. Back pain has improved since being in the ER. Hold off on further imaging.  4. Hypothyroidism-continue Synthroid.  5. Neuropathy-continue gabapentin.  6. Anxiety-continue Klonopin.  7. Essential hypertension-continue metoprolol.  8. Depression-continue Zoloft. Psych consult.  Generalized weakness.  PT evaluation suggest SNF. All the records are reviewed and case discussed with Care Management/Social Worker. Management plans discussed with the patient, family and they are in agreement.  CODE STATUS: Full Code  TOTAL TIME TAKING CARE OF THIS PATIENT: 26 minutes.   More than 50% of the time was spent in counseling/coordination of care: YES  POSSIBLE D/C IN 1-2 DAYS, DEPENDING ON CLINICAL CONDITION.   Shaune Pollack M.D on 06/26/2017 at 2:08 PM  Between 7am to 6pm - Pager - [phone]  After 6pm go to [email or web address] - Therapist, nutritional Hospitalists

## 2017-06-26 NOTE — Clinical Social Work Note (Signed)
Clinical Social Work Assessment  Patient Details  Name: Chad Huffman MRN: [id number] Date of Birth: 02/05/1946  Date of referral:  06/26/17               Reason for consult:  Facility Placement                Permission sought to share information with:  Facility Art therapist granted to share information::  Yes, Verbal Permission Granted  Name::        Agency::     Relationship::     Contact Information:     Housing/Transportation Living arrangements for the past 2 months:  Single Family Home Source of Information:  Patient, Medical Team Patient Interpreter Needed:  None Criminal Activity/Legal Involvement Pertinent to Current Situation/Hospitalization:  No - Comment as needed Significant Relationships:  Siblings Lives with:  Self Do you feel safe going back to the place where you live?  Yes Need for family participation in patient care:  Yes (Comment)(The patient's brother is his legal guardian according to the patient.)  Care giving concerns: PT recommendation for STR   Social Worker assessment / plan:  CSW met with the patient at bedside to discuss discharge planning. The patient remembered the social worker from previous admissions, and he gave permission to conduct a SNF bed search. The patient indicated that he would decline any offer from [facility], but he is willing to discharge to any other area SNFs. The CSW explained Medicare guidelines, and the patient verbalized the information back in his own terms.  The CSW also introduced the idea of looking into LTC options as he has been admitted multiple times in the past 12 months due to ill health. The patient is willing to consider Medicaid as an option. The patient is listed as having his brother as a legal guardian, and the patient indicated such. CSW attempted to contact the patient's brother and left a VM.  CSW will provide bed offers as available and follow to facilitate  discharge.  Employment status:  Retired Forensic scientist:  [employer] PT Recommendations:  [place] / Referral to community resources:  [place]  Patient/Family's Response to care: The patient thanked the CSW and was engaged in the discussion as well as in discussion about his past in the [employer] and at the [employer].  Patient/Family's Understanding of and Emotional Response to Diagnosis, Current Treatment, and Prognosis:  The patient understands the discharge plan and is in agreement. The patient's brother was not available for discussion.  Emotional Assessment Appearance:  Appears older than stated age Attitude/Demeanor/Rapport:  Engaged, Gracious Affect (typically observed):  Appropriate, Pleasant Orientation:  Oriented to Self, Oriented to Place, Oriented to  Time, Oriented to Situation Alcohol / Substance use:  Alcohol Use(Ceased use in the past 6 months.) Psych involvement (Current and /or in the community):  Outpatient Provider  Discharge Needs  Concerns to be addressed:  Care Coordination, Discharge Planning Concerns Readmission within the last 30 days:  No Current discharge risk:  Chronically ill Barriers to Discharge:  Continued Medical Work up   [employer], LCSW 06/26/2017, 5:01 PM

## 2017-06-27 LAB — BASIC METABOLIC PANEL
Anion gap: 5 (ref 5–15)
BUN: 24 mg/dL — AB (ref 6–20)
CHLORIDE: 99 mmol/L — AB (ref 101–111)
CO2: 29 mmol/L (ref 22–32)
Calcium: 8.6 mg/dL — ABNORMAL LOW (ref 8.9–10.3)
Creatinine, Ser: 0.74 mg/dL (ref 0.61–1.24)
GFR calc Af Amer: >60 mL/min (ref >60)
GLUCOSE: 97 mg/dL (ref 65–99)
POTASSIUM: 4.7 mmol/L (ref 3.5–5.1)
Sodium: 133 mmol/L — ABNORMAL LOW (ref 135–145)

## 2017-06-27 MED ORDER — HYDROCODONE-ACETAMINOPHEN 5-325 MG PO TABS: 1.0000 | ORAL_TABLET | ORAL | 0 refills | Status: DC | PRN

## 2017-06-27 MED ORDER — CLONAZEPAM 0.5 MG PO TABS: 0.2500 mg | ORAL_TABLET | Freq: Every day | ORAL | 0 refills | Status: DC

## 2017-06-27 MED ORDER — AMLODIPINE BESYLATE 5 MG PO TABS: 5.0000 mg | ORAL_TABLET | Freq: Every day | ORAL | 0 refills | Status: DC

## 2017-06-27 MED ORDER — CLONAZEPAM 0.5 MG PO TABS: 0.2500 mg | ORAL_TABLET | Freq: Two times a day (BID) | ORAL | 0 refills | Status: DC

## 2017-06-27 MED ORDER — SERTRALINE HCL 100 MG PO TABS: 100.0000 mg | ORAL_TABLET | Freq: Every day | ORAL | Status: DC

## 2017-06-27 NOTE — Discharge Instructions (Signed)
decrease Klonopin to 0.25 mg by mouth twice a day for 1 week and then 0.25 mg by mouth daily for one week. Fall  Precaution.

## 2017-06-27 NOTE — Discharge Summary (Signed)
[facility] - [place] at [facility]   PATIENT NAME: Chad PaddockDanny Booton    MR#:  [id number]  DATE OF BIRTH:  12/10/1945  DATE OF ADMISSION:  06/24/2017   ADMITTING PHYSICIAN: Houston SirenVivek J Sainani, MD  DATE OF DISCHARGE: 06/27/2017 PRIMARY CARE PHYSICIAN: Dorothey BasemanBronstein, [name], MD   ADMISSION DIAGNOSIS:  Hypercarbia [R06.89] Hyponatremia [E87.1] Generalized weakness [R53.1] Chronic low back pain, unspecified back pain laterality, with sciatica presence unspecified [M54.5, G89.29] DISCHARGE DIAGNOSIS:  Active Problems:   Hyponatremia   Adjustment disorder with mixed anxiety and depressed mood  SECONDARY DIAGNOSIS:   Past Medical History:  Diagnosis Date  . Antalgic gait   . Carotid artery occlusion   . Chronic neck and back pain   . Complication of anesthesia    pt has had cervical fusion-limited neck flextion  . COPD (chronic obstructive pulmonary disease) (HCC)   . Depression   . DJD (degenerative joint disease)    Right shoulder  . Hypertension   . Lumbago   . Peripheral neuropathy    HOSPITAL COURSE:   72 year old male with past medical history of COPD, chronic back/neck pain, neuropathy, degenerative disc disease, depression, essential hypertension who presents to the hospital due to back pain, weakness and noted to be acutely hyponatremic.   1. Hyponatremia-etiology unclear presently. Patient's sodium is 124 admission. Possible SIADH.  Na 133 today. Improving with normal saline IV and salt tablets, follow-up sodium level.  2. COPD-patient had no acute exacerbation evaluation. Continue his DuoNeb's as needed. Continue Pulmicort nebs.  3. Chronic back pain/recurrent falls-patient has previous history of degenerative disc disease and previous back surgery. Had a lumbar spine x-ray done prior to Christmas which showed no evidence of acute pathology. Back pain has improved since being in the ER. Hold off on further imaging.  4. Hypothyroidism-continue  Synthroid.  5. Neuropathy-continue gabapentin.  6. Anxiety-We'll titrate down off Klonopin as the patient should not be on a benzodiazepine given his age as well as being on concrete opioid medication. Will decrease Klonopin to 0.25 mg by mouth twice a day for 1 week and then 0.25 mg by mouth daily for one week per Dr. Maryruth BunKapur.   7. Essential hypertension-continue metoprolol.  8. Depression- increase Zoloft to 100 mg p.o. daily per Psych consult.  Generalized weakness.  PT evaluation suggest SNF  DISCHARGE CONDITIONS:  Stable, discharged to SNF today CONSULTS OBTAINED:  Treatment Team:  Darliss RidgelKapur, Aarti K, MD DRUG ALLERGIES:   Allergies  Allergen Reactions  . Ampicillin Swelling and Other (See Comments)    Unable to obtain enough information to answer additional questions about this medication.  Has patient had a PCN reaction causing immediate rash, facial/tongue/throat swelling, SOB or lightheadedness with hypotension: Yes Has patient had a PCN reaction causing severe rash involving mucus membranes or skin necrosis: No Has patient had a PCN reaction that required hospitalization No Has patient had a PCN reaction occurring within the last 10 years: Yes If all of the above answers are "NO", then may pr  . Nortriptyline Other (See Comments)    Sleep walking  . Brimonidine Other (See Comments)    Unknown reaction per MAR   . Cyclobenzaprine Other (See Comments)    Hallucinations  . Tizanidine Other (See Comments)    Hallucinations  . Gabapentin Rash   DISCHARGE MEDICATIONS:   Allergies as of 06/27/2017      Reactions   Ampicillin Swelling, Other (See Comments)   Unable to obtain enough information to answer additional questions  about this medication.  Has patient had a PCN reaction causing immediate rash, facial/tongue/throat swelling, SOB or lightheadedness with hypotension: Yes Has patient had a PCN reaction causing severe rash involving mucus membranes or skin necrosis: No Has  patient had a PCN reaction that required hospitalization No Has patient had a PCN reaction occurring within the last 10 years: Yes If all of the above answers are "NO", then may pr   Nortriptyline Other (See Comments)   Sleep walking   Brimonidine Other (See Comments)   Unknown reaction per [facility]    Cyclobenzaprine Other (See Comments)   Hallucinations   Tizanidine Other (See Comments)   Hallucinations   Gabapentin Rash      Medication List    STOP taking these medications   oxyCODONE 5 MG immediate release tablet Commonly known as:  Oxy IR/ROXICODONE   traMADol 50 MG tablet Commonly known as:  ULTRAM     TAKE these medications   amLODipine 5 MG tablet Commonly known as:  NORVASC Take 1 tablet (5 mg total) by mouth daily.   aspirin EC 81 MG tablet Take 81 mg by mouth daily.   budesonide 0.5 MG/2ML nebulizer solution Commonly known as:  PULMICORT Take 2 mLs (0.5 mg total) by nebulization 2 (two) times daily.   clonazePAM 0.5 MG tablet Commonly known as:  KLONOPIN Take 0.5 tablets (0.25 mg total) by mouth 2 (two) times daily for 5 days. What changed:  how much to take   clonazePAM 0.5 MG tablet Commonly known as:  KLONOPIN Take 0.5 tablets (0.25 mg total) by mouth daily for 7 days. Start taking on:  07/03/2017 What changed:  You were already taking a medication with the same name, and this prescription was added. Make sure you understand how and when to take each.   clopidogrel 75 MG tablet Commonly known as:  PLAVIX Take 75 mg by mouth daily.   folic acid 1 MG tablet Commonly known as:  FOLVITE Take 1 mg by mouth daily.   gabapentin 300 MG capsule Commonly known as:  NEURONTIN Take 900 mg by mouth at bedtime.   HYDROcodone-acetaminophen 5-325 MG tablet Commonly known as:  NORCO/VICODIN Take 1 tablet by mouth every 4 (four) hours as needed.   ipratropium-albuterol 0.5-2.5 (3) MG/3ML Soln Commonly known as:  DUONEB Take 3 mLs by nebulization every 6 (six)  hours as needed. Dx:J44.9   ketoconazole 2 % shampoo Commonly known as:  NIZORAL Apply 1 application topically daily as needed.   levothyroxine 75 MCG tablet Commonly known as:  SYNTHROID, LEVOTHROID Take 1 tablet (75 mcg total) by mouth daily before breakfast. What changed:  how much to take   metoprolol tartrate 25 MG tablet Commonly known as:  LOPRESSOR Take 1 tablet (25 mg total) by mouth 2 (two) times daily.   multivitamin with minerals Tabs tablet Take 1 tablet by mouth daily.   OXYGEN 4 L/min by Intratracheal route as needed (for shortness of breath).   sertraline 100 MG tablet Commonly known as:  ZOLOFT Take 1 tablet (100 mg total) by mouth daily. What changed:  medication strength   SYMBICORT 160-4.5 MCG/ACT inhaler Generic drug:  budesonide-formoterol TAKE 2 PUFFS BY MOUTH TWICE A DAY        DISCHARGE INSTRUCTIONS:  See AVS. If you experience worsening of your admission symptoms, develop shortness of breath, life threatening emergency, suicidal or homicidal thoughts you must seek medical attention immediately by calling 911 or calling your MD immediately  if symptoms less  severe.  You Must read complete instructions/literature along with all the possible adverse reactions/side effects for all the Medicines you take and that have been prescribed to you. Take any new Medicines after you have completely understood and accpet all the possible adverse reactions/side effects.   Please note  You were cared for by a hospitalist during your hospital stay. If you have any questions about your discharge medications or the care you received while you were in the hospital after you are discharged, you can call the unit and asked to speak with the hospitalist on call if the hospitalist that took care of you is not available. Once you are discharged, your primary care physician will handle any further medical issues. Please note that NO REFILLS for any discharge medications will  be authorized once you are discharged, as it is imperative that you return to your primary care physician (or establish a relationship with a primary care physician if you do not have one) for your aftercare needs so that they can reassess your need for medications and monitor your lab values.    On the day of Discharge:  VITAL SIGNS:  Blood pressure (!) 163/66, pulse (!) 58, temperature (!) 97.5 F (36.4 C), temperature source Oral, resp. rate 20, height 5\' 8"  (1.727 m), weight 208 lb (94.3 kg), SpO2 99 %. PHYSICAL EXAMINATION:  GENERAL:  72 y.o.-year-old patient lying in the bed with no acute distress.  EYES: Pupils equal, round, reactive to light and accommodation. No scleral icterus. Extraocular muscles intact.  HEENT: Head atraumatic, normocephalic. Oropharynx and nasopharynx clear.  NECK:  Supple, no jugular venous distention. No thyroid enlargement, no tenderness.  LUNGS: Normal breath sounds bilaterally, no wheezing, rales,rhonchi or crepitation. No use of accessory muscles of respiration.  CARDIOVASCULAR: S1, S2 normal. No murmurs, rubs, or gallops.  ABDOMEN: Soft, non-tender, non-distended. Bowel sounds present. No organomegaly or mass.  EXTREMITIES: No pedal edema, cyanosis, or clubbing.  NEUROLOGIC: Cranial nerves II through XII are intact. Muscle strength 4/5 in all extremities. Sensation intact. Gait not checked.  PSYCHIATRIC: The patient is alert and oriented x 3.  SKIN: No obvious rash, lesion, or ulcer.  DATA REVIEW:   CBC Recent Labs  Lab 06/24/17 1518  WBC 6.6  HGB 12.1*  HCT 36.0*  PLT 169    Chemistries  Recent Labs  Lab 06/27/17 0349  NA 133*  K 4.7  CL 99*  CO2 29  GLUCOSE 97  BUN 24*  CREATININE 0.74  CALCIUM 8.6*     Microbiology Results  Results for orders placed or performed during the hospital encounter of 06/13/17  Urine Culture     Status: Abnormal   Collection Time: 06/13/17  2:26 AM  Result Value Ref Range Status   Specimen  Description   Final    URINE, RANDOM Performed at [facility], [address], [place], [place] [address]    Special Requests   Final    NONE Performed at [facility], [address] [place], [place] [address]    Culture 80,000 COLONIES/mL SERRATIA MARCESCENS (A)  Final   Report Status 06/15/2017 FINAL  Final   Organism ID, Bacteria SERRATIA MARCESCENS (A)  Final      Susceptibility   Serratia marcescens - MIC*    CEFAZOLIN >=64 RESISTANT Resistant     CEFTRIAXONE <=1 SENSITIVE Sensitive     CIPROFLOXACIN <=0.25 SENSITIVE Sensitive     GENTAMICIN <=1 SENSITIVE Sensitive     NITROFURANTOIN 256 RESISTANT Resistant  TRIMETH/SULFA <=20 SENSITIVE Sensitive     * 80,000 COLONIES/mL SERRATIA MARCESCENS    RADIOLOGY:  No results found.   Management plans discussed with the patient, family and they are in agreement.  CODE STATUS: Full Code   TOTAL TIME TAKING CARE OF THIS PATIENT: 35 minutes.    Shaune Pollack M.D on 06/27/2017 at 9:14 AM  Between 7am to 6pm - Pager - [phone]  After 6pm go to [email or web address] - Social research officer, government  [facility] [place] [facility]  Office  [phone]  CC: Primary care physician; Dorothey Baseman, MD   Note: This dictation was prepared with Dragon dictation along with smaller phrase technology. Any transcriptional errors that result from this process are unintentional.

## 2017-06-27 NOTE — Progress Notes (Signed)
Clinical Social Worker (CSW) presented bed offers to patient and he chose [facility]. Patient is medically stable for D/C to [facility] today. Per [name] [facility] liaison patient can come today to room 808. RN will call report to RN [name] at [phone] and arrange EMS for transport. CSW sent D/C orders to [facility] via [facility]. Patient is aware of above. Patient [facility]. CSW made patient aware that it will be unlikely that he can make that appointment and he will have to reschedule. Patient verbalized his understanding. CSW contacted patient's brother [name] and made him aware of above. Per [name] he is patient's HPOA and patient does not have a guardian. Please reconsult if future social work needs arise. CSW signing off.   [employer] Hughes IncorporatedBailey Ragnar Waas, LCSW [phone]

## 2017-06-27 NOTE — Care Management Important Message (Signed)
Important Message  Patient Details  Name: Chad LabradorDanny L Maldonado MRN: [id number] Date of Birth: 1946-03-01   Medicare Important Message Given:  Yes    Gwenette GreetBrenda S Marcella Dunnaway, RN 06/27/2017, 7:25 AM

## 2017-06-27 NOTE — Clinical Social Work Placement (Signed)
   CLINICAL SOCIAL WORK PLACEMENT  NOTE  Date:  06/27/2017  Patient Details  Name: Chad Huffman MRN: [id number] Date of Birth: May 04, 1946  Clinical Social Work is seeking post-discharge placement for this patient at the Skilled  Nursing Facility level of care (*CSW will initial, date and re-position this form in  chart as items are completed):  Yes   Patient/family provided with [facility] Clinical Social Work Department's list of facilities offering this level of care within the geographic area requested by the patient (or if unable, by the patient's family).  Yes   Patient/family informed of their freedom to choose among providers that offer the needed level of care, that participate in Medicare, Medicaid or managed care program needed by the patient, have an available bed and are willing to accept the patient.  Yes   Patient/family informed of [facility]'s ownership interest in [facility] and [facility], as well as of the fact that they are under no obligation to receive care at these facilities.  PASRR submitted to EDS on       PASRR number received on       Existing PASRR number confirmed on 06/26/17     FL2 transmitted to all facilities in geographic area requested by pt/family on 06/26/17     FL2 transmitted to all facilities within larger geographic area on       Patient informed that his/her managed care company has contracts with or will negotiate with certain facilities, including the following:        Yes   Patient/family informed of bed offers received.  Patient chooses bed at ([facility] )     Physician recommends and patient chooses bed at      Patient to be transferred to ([facility] ) on 06/27/17.  Patient to be transferred to facility by [facility]     Patient family notified on 06/27/17 of transfer.  Name of family member notified:  (Patient's brother [name] is aware of D/C today. )     PHYSICIAN       Additional Comment:     _______________________________________________ Alethia Melendrez, Darleen CrockerBailey M, LCSW 06/27/2017, 10:05 AM

## 2017-06-27 NOTE — Progress Notes (Signed)
Called report to kim Art gallery managerhicks RN at [facility]

## 2017-07-02 LAB — BLOOD GAS, VENOUS
Acid-Base Excess: 8.5 mmol/L — ABNORMAL HIGH (ref 0.0–2.0)
Bicarbonate: 37.3 mmol/L — ABNORMAL HIGH (ref 20.0–28.0)
O2 SAT: 79.3 %
PCO2 VEN: 74 mmHg — AB (ref 44.0–60.0)
PH VEN: 7.31 (ref 7.250–7.430)
Patient temperature: 37
pO2, Ven: 48 mmHg — ABNORMAL HIGH (ref 32.0–45.0)

## 2017-07-03 IMAGING — CR DG CHEST 2V
2 series · 2 of 2 positions shown · non-contrast
Comparison: Chest x-ray 07/22/2015.

CLINICAL DATA: 70-year-old male with history of shortness of breath
over night. History of COPD. On oxygen at night. Afebrile.

EXAM:
CHEST  2 VIEW

[chest lat]
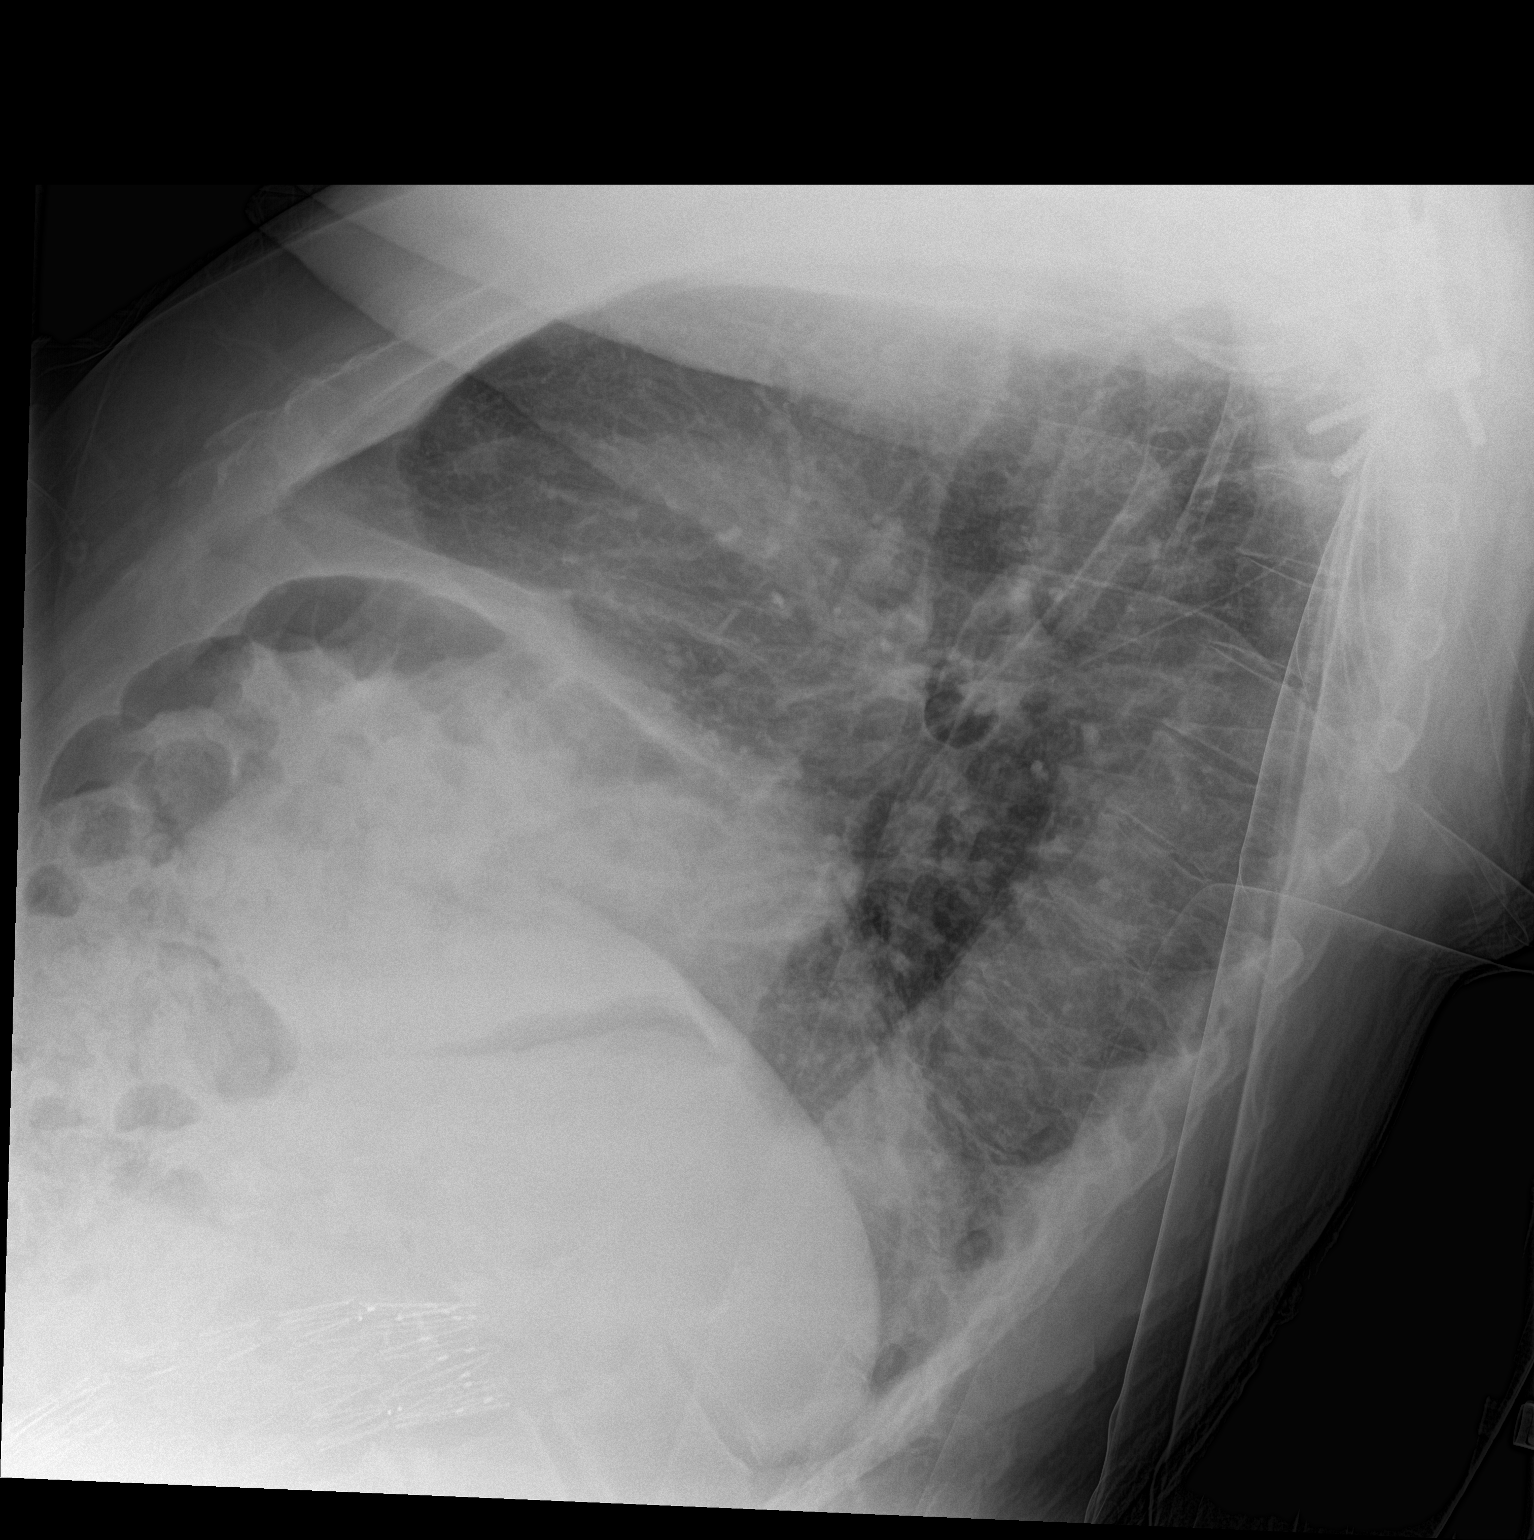

[chest ap]
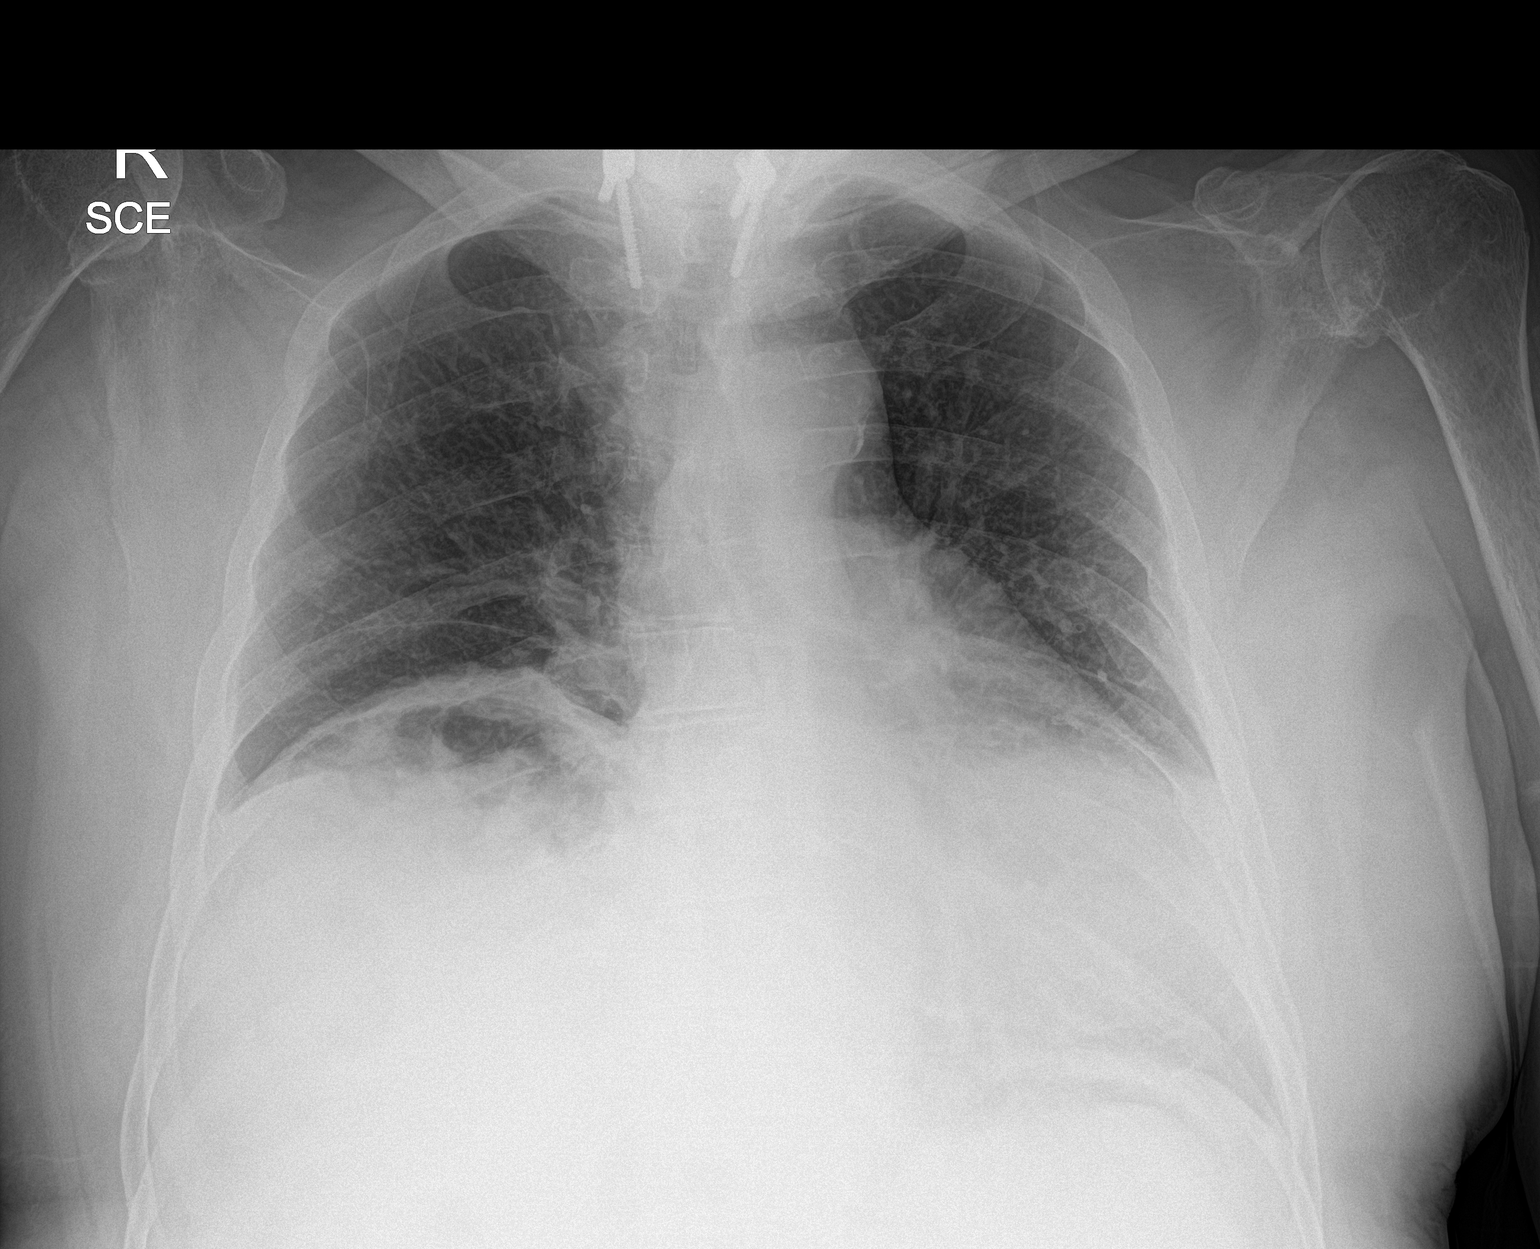

[2 of 2 positions shown; findings below may reference images not displayed]

FINDINGS: Marked elevation of the right hemidiaphragm is similar to prior
studies. No acute consolidative airspace disease. No pleural
effusions. No evidence of pulmonary edema. Heart size is mildly
enlarged. Upper mediastinal contours are within normal limits.
Atherosclerosis in the thoracic aorta. Orthopedic fixation hardware
in the cervical spine incompletely imaged. Multiple old healed
left-sided rib fractures are again noted.
IMPRESSION: 1. No radiographic evidence of acute cardiopulmonary disease.
2. Mild cardiomegaly.
3. Chronic elevation of the right hemidiaphragm is unchanged.
4. Atherosclerosis.

## 2017-09-09 ENCOUNTER — Inpatient Hospital Stay: Payer: Medicare Other

## 2017-09-09 ENCOUNTER — Encounter: Payer: Self-pay | Admitting: Emergency Medicine

## 2017-09-09 ENCOUNTER — Emergency Department: Payer: Medicare Other

## 2017-09-09 ENCOUNTER — Other Ambulatory Visit: Payer: Self-pay

## 2017-09-09 ENCOUNTER — Inpatient Hospital Stay
Admission: EM | Admit: 2017-09-09 | Discharge: 2017-09-13 | DRG: 193 | Disposition: A | Discharge status: Home / Self Care | Payer: Medicare Other | Attending: Internal Medicine | Admitting: Internal Medicine

## 2017-09-09 DIAGNOSIS — E039 Hypothyroidism, unspecified: Secondary | ICD-10-CM | POA: Diagnosis present

## 2017-09-09 DIAGNOSIS — Z7982 Long term (current) use of aspirin: Secondary | ICD-10-CM

## 2017-09-09 DIAGNOSIS — M19011 Primary osteoarthritis, right shoulder: Secondary | ICD-10-CM | POA: Diagnosis present

## 2017-09-09 DIAGNOSIS — Z9981 Dependence on supplemental oxygen: Secondary | ICD-10-CM

## 2017-09-09 DIAGNOSIS — Z888 Allergy status to other drugs, medicaments and biological substances status: Secondary | ICD-10-CM | POA: Diagnosis not present

## 2017-09-09 DIAGNOSIS — I420 Dilated cardiomyopathy: Secondary | ICD-10-CM | POA: Diagnosis present

## 2017-09-09 DIAGNOSIS — J9621 Acute and chronic respiratory failure with hypoxia: Secondary | ICD-10-CM | POA: Diagnosis present

## 2017-09-09 DIAGNOSIS — J9601 Acute respiratory failure with hypoxia: Secondary | ICD-10-CM

## 2017-09-09 DIAGNOSIS — R609 Edema, unspecified: Secondary | ICD-10-CM

## 2017-09-09 DIAGNOSIS — Z79899 Other long term (current) drug therapy: Secondary | ICD-10-CM

## 2017-09-09 DIAGNOSIS — L0390 Cellulitis, unspecified: Secondary | ICD-10-CM

## 2017-09-09 DIAGNOSIS — Z66 Do not resuscitate: Secondary | ICD-10-CM | POA: Diagnosis present

## 2017-09-09 DIAGNOSIS — I272 Pulmonary hypertension, unspecified: Secondary | ICD-10-CM | POA: Diagnosis present

## 2017-09-09 DIAGNOSIS — J9602 Acute respiratory failure with hypercapnia: Secondary | ICD-10-CM

## 2017-09-09 DIAGNOSIS — Z7902 Long term (current) use of antithrombotics/antiplatelets: Secondary | ICD-10-CM

## 2017-09-09 DIAGNOSIS — I10 Essential (primary) hypertension: Secondary | ICD-10-CM | POA: Diagnosis present

## 2017-09-09 DIAGNOSIS — Z981 Arthrodesis status: Secondary | ICD-10-CM

## 2017-09-09 DIAGNOSIS — Z79890 Hormone replacement therapy (postmenopausal): Secondary | ICD-10-CM | POA: Diagnosis not present

## 2017-09-09 DIAGNOSIS — Z87891 Personal history of nicotine dependence: Secondary | ICD-10-CM

## 2017-09-09 DIAGNOSIS — R0602 Shortness of breath: Secondary | ICD-10-CM | POA: Diagnosis not present

## 2017-09-09 DIAGNOSIS — G629 Polyneuropathy, unspecified: Secondary | ICD-10-CM | POA: Diagnosis present

## 2017-09-09 DIAGNOSIS — J189 Pneumonia, unspecified organism: Secondary | ICD-10-CM | POA: Diagnosis present

## 2017-09-09 DIAGNOSIS — J181 Lobar pneumonia, unspecified organism: Principal | ICD-10-CM | POA: Diagnosis present

## 2017-09-09 DIAGNOSIS — L03115 Cellulitis of right lower limb: Secondary | ICD-10-CM | POA: Diagnosis present

## 2017-09-09 DIAGNOSIS — Z7951 Long term (current) use of inhaled steroids: Secondary | ICD-10-CM | POA: Diagnosis not present

## 2017-09-09 DIAGNOSIS — Z880 Allergy status to penicillin: Secondary | ICD-10-CM

## 2017-09-09 DIAGNOSIS — L03116 Cellulitis of left lower limb: Secondary | ICD-10-CM | POA: Diagnosis present

## 2017-09-09 DIAGNOSIS — J9622 Acute and chronic respiratory failure with hypercapnia: Secondary | ICD-10-CM | POA: Diagnosis present

## 2017-09-09 DIAGNOSIS — J188 Other pneumonia, unspecified organism: Secondary | ICD-10-CM

## 2017-09-09 DIAGNOSIS — J440 Chronic obstructive pulmonary disease with acute lower respiratory infection: Secondary | ICD-10-CM | POA: Diagnosis present

## 2017-09-09 DIAGNOSIS — J441 Chronic obstructive pulmonary disease with (acute) exacerbation: Secondary | ICD-10-CM | POA: Diagnosis present

## 2017-09-09 DIAGNOSIS — R600 Localized edema: Secondary | ICD-10-CM

## 2017-09-09 DIAGNOSIS — A419 Sepsis, unspecified organism: Secondary | ICD-10-CM

## 2017-09-09 LAB — CBC WITH DIFFERENTIAL/PLATELET
BASOS PCT: 1 %
Basophils Absolute: 0 10*3/uL (ref 0–0.1)
Eosinophils Absolute: 0.2 10*3/uL (ref 0–0.7)
Eosinophils Relative: 3 %
HEMATOCRIT: 32.3 % — AB (ref 40.0–52.0)
HEMOGLOBIN: 10.4 g/dL — AB (ref 13.0–18.0)
LYMPHS ABS: 0.6 10*3/uL — AB (ref 1.0–3.6)
Lymphocytes Relative: 10 %
MCH: 30.7 pg (ref 26.0–34.0)
MCHC: 32.1 g/dL (ref 32.0–36.0)
MCV: 95.4 fL (ref 80.0–100.0)
MONOS PCT: 15 %
Monocytes Absolute: 0.9 10*3/uL (ref 0.2–1.0)
NEUTROS ABS: 4.1 10*3/uL (ref 1.4–6.5)
NEUTROS PCT: 71 %
Platelets: 132 10*3/uL — ABNORMAL LOW (ref 150–440)
RBC: 3.38 MIL/uL — ABNORMAL LOW (ref 4.40–5.90)
RDW: 16.9 % — ABNORMAL HIGH (ref 11.5–14.5)
WBC: 5.7 10*3/uL (ref 3.8–10.6)

## 2017-09-09 LAB — BLOOD GAS, VENOUS
PATIENT TEMPERATURE: 37
PCO2 VEN: 80 mmHg — AB (ref 44.0–60.0)
PO2 VEN: 36 mmHg (ref 32.0–45.0)
pH, Ven: 7.31 (ref 7.250–7.430)

## 2017-09-09 LAB — COMPREHENSIVE METABOLIC PANEL
ALT: 17 U/L (ref 17–63)
ANION GAP: 7 (ref 5–15)
AST: 23 U/L (ref 15–41)
Albumin: 4 g/dL (ref 3.5–5.0)
Alkaline Phosphatase: 69 U/L (ref 38–126)
BUN: 19 mg/dL (ref 6–20)
CHLORIDE: 97 mmol/L — AB (ref 101–111)
CO2: 32 mmol/L (ref 22–32)
Calcium: 8.7 mg/dL — ABNORMAL LOW (ref 8.9–10.3)
Creatinine, Ser: 1 mg/dL (ref 0.61–1.24)
GFR calc Af Amer: >60 mL/min (ref >60)
GFR calc non Af Amer: >60 mL/min (ref >60)
Glucose, Bld: 98 mg/dL (ref 65–99)
POTASSIUM: 4.6 mmol/L (ref 3.5–5.1)
SODIUM: 136 mmol/L (ref 135–145)
Total Bilirubin: 1.2 mg/dL (ref 0.3–1.2)
Total Protein: 6.7 g/dL (ref 6.5–8.1)

## 2017-09-09 LAB — URINALYSIS, COMPLETE (UACMP) WITH MICROSCOPIC
Bacteria, UA: NONE SEEN
Bilirubin Urine: NEGATIVE
Glucose, UA: NEGATIVE mg/dL
Hgb urine dipstick: NEGATIVE
KETONES UR: NEGATIVE mg/dL
Nitrite: NEGATIVE
PH: 6 (ref 5.0–8.0)
Protein, ur: NEGATIVE mg/dL
SPECIFIC GRAVITY, URINE: 1.013 (ref 1.005–1.030)
Squamous Epithelial / LPF: NONE SEEN

## 2017-09-09 LAB — PROTIME-INR
INR: 1.05
Prothrombin Time: 13.6 seconds (ref 11.4–15.2)

## 2017-09-09 LAB — APTT: APTT: 41 s — AB (ref 24–36)

## 2017-09-09 LAB — LACTIC ACID, PLASMA: Lactic Acid, Venous: 0.8 mmol/L (ref 0.5–1.9)

## 2017-09-09 LAB — PROCALCITONIN

## 2017-09-09 LAB — TROPONIN I: Troponin I: <0.03 ng/mL (ref <0.03)

## 2017-09-09 LAB — LIPASE, BLOOD: Lipase: 25 U/L (ref 11–51)

## 2017-09-09 MED ORDER — BUDESONIDE 0.5 MG/2ML IN SUSP
0.5000 mg | Freq: Two times a day (BID) | RESPIRATORY_TRACT | Status: DC
  Administered 2017-09-09 – 2017-09-13 (×8): 0.5 mg via RESPIRATORY_TRACT
  Filled 2017-09-09 (×8): qty 2

## 2017-09-09 MED ORDER — ORAL CARE MOUTH RINSE
15.0000 mL | Freq: Two times a day (BID) | OROMUCOSAL | Status: DC
  Administered 2017-09-09 – 2017-09-10 (×2): 15 mL via OROMUCOSAL

## 2017-09-09 MED ORDER — SERTRALINE HCL 50 MG PO TABS
100.0000 mg | ORAL_TABLET | Freq: Every day | ORAL | Status: DC
  Administered 2017-09-09 – 2017-09-13 (×5): 100 mg via ORAL
  Filled 2017-09-09 (×5): qty 2

## 2017-09-09 MED ORDER — HYDROCODONE-ACETAMINOPHEN 5-325 MG PO TABS
1.0000 | ORAL_TABLET | ORAL | Status: DC | PRN
  Administered 2017-09-09 – 2017-09-13 (×2): 1 via ORAL
  Filled 2017-09-09 (×3): qty 1

## 2017-09-09 MED ORDER — DOCUSATE SODIUM 100 MG PO CAPS
100.0000 mg | ORAL_CAPSULE | Freq: Two times a day (BID) | ORAL | Status: DC
  Administered 2017-09-09 – 2017-09-13 (×8): 100 mg via ORAL
  Filled 2017-09-09 (×9): qty 1

## 2017-09-09 MED ORDER — SODIUM CHLORIDE 0.9 % IV SOLN
1.0000 g | Freq: Once | INTRAVENOUS | Status: AC
  Administered 2017-09-09: 1 g via INTRAVENOUS
  Filled 2017-09-09: qty 10

## 2017-09-09 MED ORDER — ONDANSETRON HCL 4 MG PO TABS: 4.0000 mg | ORAL_TABLET | Freq: Four times a day (QID) | ORAL | Status: DC | PRN

## 2017-09-09 MED ORDER — ALBUTEROL SULFATE (2.5 MG/3ML) 0.083% IN NEBU
5.0000 mg | INHALATION_SOLUTION | Freq: Once | RESPIRATORY_TRACT | Status: AC
  Administered 2017-09-09: 5 mg via RESPIRATORY_TRACT
  Filled 2017-09-09: qty 6

## 2017-09-09 MED ORDER — IPRATROPIUM-ALBUTEROL 0.5-2.5 (3) MG/3ML IN SOLN
3.0000 mL | Freq: Once | RESPIRATORY_TRACT | Status: AC
  Administered 2017-09-09: 3 mL via RESPIRATORY_TRACT
  Filled 2017-09-09: qty 3

## 2017-09-09 MED ORDER — ENOXAPARIN SODIUM 40 MG/0.4ML ~~LOC~~ SOLN
40.0000 mg | SUBCUTANEOUS | Status: DC
  Administered 2017-09-09 – 2017-09-12 (×4): 40 mg via SUBCUTANEOUS
  Filled 2017-09-09 (×4): qty 0.4

## 2017-09-09 MED ORDER — METHYLPREDNISOLONE SODIUM SUCC 125 MG IJ SOLR
60.0000 mg | Freq: Four times a day (QID) | INTRAMUSCULAR | Status: DC
  Administered 2017-09-09 – 2017-09-10 (×3): 60 mg via INTRAVENOUS
  Filled 2017-09-09 (×3): qty 2

## 2017-09-09 MED ORDER — ACETAMINOPHEN 650 MG RE SUPP: 650.0000 mg | Freq: Four times a day (QID) | RECTAL | Status: DC | PRN

## 2017-09-09 MED ORDER — ADULT MULTIVITAMIN W/MINERALS CH
1.0000 | ORAL_TABLET | Freq: Every day | ORAL | Status: DC
  Administered 2017-09-09 – 2017-09-13 (×5): 1 via ORAL
  Filled 2017-09-09 (×5): qty 1

## 2017-09-09 MED ORDER — IOPAMIDOL (ISOVUE-370) INJECTION 76%
75.0000 mL | Freq: Once | INTRAVENOUS | Status: AC | PRN
  Administered 2017-09-09: 75 mL via INTRAVENOUS

## 2017-09-09 MED ORDER — MAGNESIUM SULFATE 2 GM/50ML IV SOLN
2.0000 g | INTRAVENOUS | Status: AC
  Administered 2017-09-09: 2 g via INTRAVENOUS
  Filled 2017-09-09: qty 50

## 2017-09-09 MED ORDER — ONDANSETRON HCL 4 MG/2ML IJ SOLN: 4.0000 mg | Freq: Four times a day (QID) | INTRAMUSCULAR | Status: DC | PRN

## 2017-09-09 MED ORDER — HYDROCORTISONE 1 % EX CREA
TOPICAL_CREAM | Freq: Three times a day (TID) | CUTANEOUS | Status: DC | PRN
  Filled 2017-09-09: qty 28

## 2017-09-09 MED ORDER — METHYLPREDNISOLONE SODIUM SUCC 125 MG IJ SOLR
125.0000 mg | Freq: Once | INTRAMUSCULAR | Status: AC
  Administered 2017-09-09: 125 mg via INTRAVENOUS
  Filled 2017-09-09: qty 2

## 2017-09-09 MED ORDER — IPRATROPIUM-ALBUTEROL 0.5-2.5 (3) MG/3ML IN SOLN
3.0000 mL | Freq: Four times a day (QID) | RESPIRATORY_TRACT | Status: DC
  Administered 2017-09-09 – 2017-09-10 (×6): 3 mL via RESPIRATORY_TRACT
  Filled 2017-09-09 (×6): qty 3

## 2017-09-09 MED ORDER — SODIUM CHLORIDE 0.9 % IV SOLN
500.0000 mg | Freq: Once | INTRAVENOUS | Status: AC
  Administered 2017-09-09: 500 mg via INTRAVENOUS
  Filled 2017-09-09: qty 500

## 2017-09-09 MED ORDER — BACLOFEN 10 MG PO TABS
10.0000 mg | ORAL_TABLET | Freq: Three times a day (TID) | ORAL | Status: DC | PRN
  Filled 2017-09-09: qty 1

## 2017-09-09 MED ORDER — CLOPIDOGREL BISULFATE 75 MG PO TABS
75.0000 mg | ORAL_TABLET | Freq: Every day | ORAL | Status: DC
  Administered 2017-09-09 – 2017-09-13 (×5): 75 mg via ORAL
  Filled 2017-09-09 (×5): qty 1

## 2017-09-09 MED ORDER — OMEGA-3-ACID ETHYL ESTERS 1 G PO CAPS
1.0000 | ORAL_CAPSULE | Freq: Every day | ORAL | Status: DC
  Administered 2017-09-09 – 2017-09-13 (×5): 1 g via ORAL
  Filled 2017-09-09 (×5): qty 1

## 2017-09-09 MED ORDER — AZITHROMYCIN 500 MG PO TABS
250.0000 mg | ORAL_TABLET | Freq: Every day | ORAL | Status: DC
  Administered 2017-09-11 – 2017-09-13 (×3): 250 mg via ORAL
  Filled 2017-09-09 (×3): qty 1

## 2017-09-09 MED ORDER — METOPROLOL TARTRATE 25 MG PO TABS
25.0000 mg | ORAL_TABLET | Freq: Two times a day (BID) | ORAL | Status: DC
  Administered 2017-09-09 – 2017-09-13 (×9): 25 mg via ORAL
  Filled 2017-09-09 (×9): qty 1

## 2017-09-09 MED ORDER — BUDESONIDE 0.5 MG/2ML IN SUSP
0.5000 mg | Freq: Two times a day (BID) | RESPIRATORY_TRACT | Status: DC
  Filled 2017-09-09: qty 2

## 2017-09-09 MED ORDER — ALBUTEROL SULFATE (2.5 MG/3ML) 0.083% IN NEBU: 2.5000 mg | INHALATION_SOLUTION | RESPIRATORY_TRACT | Status: DC | PRN

## 2017-09-09 MED ORDER — ACETAMINOPHEN 325 MG PO TABS: 650.0000 mg | ORAL_TABLET | Freq: Four times a day (QID) | ORAL | Status: DC | PRN

## 2017-09-09 MED ORDER — SODIUM CHLORIDE 0.9 % IV SOLN
1.0000 g | Freq: Every day | INTRAVENOUS | Status: DC
  Administered 2017-09-10 – 2017-09-13 (×4): 1 g via INTRAVENOUS
  Filled 2017-09-09 (×4): qty 10

## 2017-09-09 MED ORDER — FOLIC ACID 1 MG PO TABS
1.0000 mg | ORAL_TABLET | Freq: Every day | ORAL | Status: DC
  Administered 2017-09-09 – 2017-09-13 (×5): 1 mg via ORAL
  Filled 2017-09-09 (×5): qty 1

## 2017-09-09 MED ORDER — LEVOTHYROXINE SODIUM 50 MCG PO TABS
75.0000 ug | ORAL_TABLET | Freq: Every day | ORAL | Status: DC
  Administered 2017-09-09 – 2017-09-12 (×4): 75 ug via ORAL
  Filled 2017-09-09 (×4): qty 2

## 2017-09-09 MED ORDER — CLONAZEPAM 0.5 MG PO TABS
0.2500 mg | ORAL_TABLET | Freq: Two times a day (BID) | ORAL | Status: DC
  Administered 2017-09-09 – 2017-09-13 (×9): 0.25 mg via ORAL
  Filled 2017-09-09 (×9): qty 1

## 2017-09-09 MED ORDER — CLONAZEPAM 0.5 MG PO TABS: 0.2500 mg | ORAL_TABLET | Freq: Every day | ORAL | Status: DC

## 2017-09-09 MED ORDER — AZITHROMYCIN 500 MG PO TABS
500.0000 mg | ORAL_TABLET | Freq: Every day | ORAL | Status: AC
  Administered 2017-09-10: 500 mg via ORAL
  Filled 2017-09-09: qty 1

## 2017-09-09 MED ORDER — ASPIRIN EC 81 MG PO TBEC
81.0000 mg | DELAYED_RELEASE_TABLET | Freq: Every day | ORAL | Status: DC
  Administered 2017-09-09 – 2017-09-13 (×5): 81 mg via ORAL
  Filled 2017-09-09 (×5): qty 1

## 2017-09-09 NOTE — ED Notes (Signed)
Patient taken to ultrasound.

## 2017-09-09 NOTE — H&P (Signed)
[facility] [place] at [facility]   PATIENT NAME: Chad Huffman    MR#:  [id number]  DATE OF BIRTH:  28-Nov-1945  DATE OF ADMISSION:  09/09/2017  PRIMARY CARE PHYSICIAN: Dorothey Baseman, MD   REQUESTING/REFERRING PHYSICIAN: Scotty Court  CHIEF COMPLAINT:   Shortness of breath HISTORY OF PRESENT ILLNESS:  Chad Huffman  is a 72 y.o. male with a known history of COPD sees Dr. Darrol Angel as an outpatient, chronically lives on 3 L of oxygen via nasal cannula during nighttime and as needed basis, essential hypertension and dilated cardiomyopathy and other medical problems is presenting to the ED with a chief complaint of shortness of breath and found to have hypoxia.  CT angiogram of the chest has revealed right-sided multilobar pneumonia And pulmonary hypertension.  Patient reports a severe tightness in his chest and working hard to breathe.  Patient was given IV salmeterol and magnesium sulfate in the ED. PAST MEDICAL HISTORY:   Past Medical History:  Diagnosis Date  . Antalgic gait   . Carotid artery occlusion   . Chronic neck and back pain   . Complication of anesthesia    pt has had cervical fusion-limited neck flextion  . COPD (chronic obstructive pulmonary disease) (HCC)   . Depression   . DJD (degenerative joint disease)    Right shoulder  . Hypertension   . Lumbago   . Peripheral neuropathy     PAST SURGICAL HISTOIRY:   Past Surgical History:  Procedure Laterality Date  . ABDOMINAL AORTIC ANEURYSM REPAIR  2009   Endovascular AAA repair  . ABDOMINAL AORTIC ANEURYSM REPAIR    . CAROTID ANGIOGRAPHY  09/09/2016   Procedure: Carotid Angiography;  Surgeon: Nada Libman, MD;  Location: [facility] INVASIVE CV LAB;  Service: Cardiovascular;;  rt. carotid  . CARPAL TUNNEL RELEASE     bilateral CTS  . ESOPHAGOGASTRODUODENOSCOPY (EGD) WITH PROPOFOL N/A 09/28/2016   Procedure: ESOPHAGOGASTRODUODENOSCOPY (EGD) WITH PROPOFOL;  Surgeon: Midge Minium, MD;  Location: [facility]  ENDOSCOPY;  Service: Endoscopy;  Laterality: N/A;  . HAMMER TOE SURGERY  12/10/2011   Procedure: HAMMER TOE CORRECTION;  Surgeon: Ernestene Kiel, DPM;  Location: [facility];  Service: Podiatry;  Laterality: Right;  Right hammertoe repair second right with 2.5x42 orthopro screw   . IR GENERIC HISTORICAL  04/05/2016   IR GASTROSTOMY TUBE MOD SED 04/05/2016 [facility]  . IR GENERIC HISTORICAL  05/12/2016   IR CM INJ ANY COLONIC TUBE W/FLUORO 05/12/2016 Malachy Moan, MD [facility]  . IR GENERIC HISTORICAL  05/26/2016   IR CM INJ ANY COLONIC TUBE W/FLUORO 05/26/2016 [facility]  . IR GENERIC HISTORICAL  05/28/2016   IR REPLC GASTRO/COLONIC TUBE PERCUT W/FLUORO 05/28/2016 Brayton El, PA-C [facility]  . IR GENERIC HISTORICAL  07/02/2016   IR GASTROSTOMY TUBE REMOVAL 07/02/2016 Brayton El, PA-C [facility]  . METATARSAL OSTEOTOMY  12/10/2011   Procedure: METATARSAL OSTEOTOMY;  Surgeon: Ernestene Kiel, DPM;  Location: [facility];  Service: Podiatry;  Laterality: Right;  Right Keller arthroplasty with size 5 silicone implant right great toe; second metatarsal osteotomy with 2.0x14 screw  . NECK SURGERY    . PERIPHERAL VASCULAR CATHETERIZATION N/A 02/11/2015   Procedure: Carotid Angiography;  Surgeon: Nada Libman, MD;  Location: [facility] INVASIVE CV LAB;  Service: Cardiovascular;  Laterality: N/A;  . PILONIDAL CYST / SINUS EXCISION    . Rotary cuff    . SPINE SURGERY  09/15/11   Scar tissue removed- back  .  SPINE SURGERY  09/15/11   Scar tissue removed- back  . TONSILLECTOMY    . TRACHEOSTOMY TUBE PLACEMENT N/A 03/03/2016   Procedure: TRACHEOSTOMY;  Surgeon: Geanie LoganPaul Bennett, MD;  Location: [facility] ORS;  Service: ENT;  Laterality: N/A;    SOCIAL HISTORY:   Social History   Tobacco Use  . Smoking status: Former Smoker    Packs/day: 1.00    Years: 30.00    Pack years: 30.00    Types: Cigarettes    Last attempt to quit: 08/30/2006    Years since quitting: 11.0  .  Smokeless tobacco: Never Used  Substance Use Topics  . Alcohol use: Yes    Alcohol/week: 1.2 oz    Types: 2 Cans of beer per week    Comment: occasional use    FAMILY HISTORY:   Family History  Problem Relation Age of Onset  . Stomach cancer Mother   . Hypertension Brother   . Heart attack Father     DRUG ALLERGIES:   Allergies  Allergen Reactions  . Ampicillin Swelling and Other (See Comments)    Unable to obtain enough information to answer additional questions about this medication.  Has patient had a PCN reaction causing immediate rash, facial/tongue/throat swelling, SOB or lightheadedness with hypotension: Yes Has patient had a PCN reaction causing severe rash involving mucus membranes or skin necrosis: No Has patient had a PCN reaction that required hospitalization No Has patient had a PCN reaction occurring within the last 10 years: Yes If all of the above answers are "NO", then may pr  . Nortriptyline Other (See Comments)    Sleep walking  . Brimonidine Other (See Comments)    Unknown reaction per MAR   . Cyclobenzaprine Other (See Comments)    Hallucinations  . Tizanidine Other (See Comments)    Hallucinations  . Gabapentin Rash    REVIEW OF SYSTEMS:  CONSTITUTIONAL: No fever, fatigue or weakness.  EYES: No blurred or double vision.  EARS, NOSE, AND THROAT: No tinnitus or ear pain.  RESPIRATORY: Reporting  cough, shortness of breath, wheezing, denies hemoptysis.  CARDIOVASCULAR: No chest pain, orthopnea, edema.  GASTROINTESTINAL: No nausea, vomiting, diarrhea or abdominal pain.  GENITOURINARY: No dysuria, hematuria.  ENDOCRINE: No polyuria, nocturia,  HEMATOLOGY: No anemia, easy bruising or bleeding SKIN: No rash or lesion. MUSCULOSKELETAL: No joint pain or arthritis.   NEUROLOGIC: No tingling, numbness, weakness.  PSYCHIATRY: No anxiety or depression.   MEDICATIONS AT HOME:   Prior to Admission medications   Medication Sig Start Date End Date Taking?  Authorizing Provider  amLODipine (NORVASC) 5 MG tablet Take 1 tablet (5 mg total) by mouth daily. 06/27/17  Yes Shaune Pollackhen, Qing, MD  aspirin EC 81 MG tablet Take 81 mg by mouth daily.   Yes [provider]  baclofen (LIORESAL) 10 MG tablet Take 10 mg by mouth 3 (three) times daily.   Yes [provider]  clonazePAM (KLONOPIN) 0.5 MG tablet Take 0.5 tablets (0.25 mg total) by mouth 2 (two) times daily for 5 days. 06/27/17 09/09/17 Yes Shaune Pollackhen, Qing, MD  clopidogrel (PLAVIX) 75 MG tablet Take 75 mg by mouth daily.   Yes [provider]  desonide (DESOWEN) 0.05 % cream Apply 1 application topically 2 (two) times daily.   Yes [provider]  folic acid (FOLVITE) 1 MG tablet Take 1 mg by mouth daily.    Yes [provider]  Providence LaniusKrill Oil Omega-3 500 MG CAPS Take 1 tablet by mouth daily.   Yes  [provider]  levothyroxine (SYNTHROID, LEVOTHROID) 75 MCG tablet Take 1 tablet (75 mcg total) by mouth daily before breakfast. Patient taking differently: Take 200 mcg by mouth daily before breakfast.  09/16/16  Yes Enid Baas, MD  metoprolol tartrate (LOPRESSOR) 25 MG tablet Take 1 tablet (25 mg total) by mouth 2 (two) times daily. 07/27/16  Yes Katharina Caper, MD  Multiple Vitamin (MULTIVITAMIN WITH MINERALS) TABS tablet Take 1 tablet by mouth daily.   Yes [provider]  sertraline (ZOLOFT) 100 MG tablet Take 1 tablet (100 mg total) by mouth daily. 06/27/17  Yes Shaune Pollack, MD  SYMBICORT 160-4.5 MCG/ACT inhaler TAKE 2 PUFFS BY MOUTH TWICE A DAY 06/01/17  Yes Merwyn Katos, MD  budesonide (PULMICORT) 0.5 MG/2ML nebulizer solution Take 2 mLs (0.5 mg total) by nebulization 2 (two) times daily. 12/21/16   Merwyn Katos, MD  clonazePAM (KLONOPIN) 0.5 MG tablet Take 0.5 tablets (0.25 mg total) by mouth daily for 7 days. Patient not taking: Reported on 09/09/2017 07/03/17 07/10/17  Shaune Pollack, MD  HYDROcodone-acetaminophen (NORCO/VICODIN) 5-325 MG tablet Take 1  tablet by mouth every 4 (four) hours as needed. Patient not taking: Reported on 09/09/2017 06/27/17   Shaune Pollack, MD  ipratropium-albuterol (DUONEB) 0.5-2.5 (3) MG/3ML SOLN Take 3 mLs by nebulization every 6 (six) hours as needed. Dx:J44.9 12/21/16   Merwyn Katos, MD  ketoconazole (NIZORAL) 2 % shampoo Apply 1 application topically daily as needed.    [provider]  OXYGEN 4 L/min by Intratracheal route as needed (for shortness of breath).     [provider]      VITAL SIGNS:  Blood pressure (!) 173/65, pulse 78, temperature 98 F (36.7 C), temperature source Oral, resp. rate 20, height 5' 8.5" (1.74 m), weight 106.6 kg (235 lb), SpO2 94 %.  PHYSICAL EXAMINATION:  GENERAL:  72 y.o.-year-old patient lying in the bed with no acute distress.  EYES: Pupils equal, round, reactive to light and accommodation. No scleral icterus. Extraocular muscles intact.  HEENT: Head atraumatic, normocephalic. Oropharynx and nasopharynx clear.  NECK:  Supple, no jugular venous distention. No thyroid enlargement, no tenderness.  LUNGS: diminished breath sounds bilaterally, no wheezing, rales,rhonchi or crepitation.  Some use of accessory muscles of respiration.  CARDIOVASCULAR: S1, S2 normal. No murmurs, rubs, or gallops.  ABDOMEN: Soft, nontender, nondistended. Bowel sounds present. No organomegaly or mass.  EXTREMITIES: Bilateral lower extremity edema, erythema n NEUROLOGIC: Cranial nerves II through XII are intact. Muscle strength 5/5 in all extremities. Sensation intact. Gait not checked.  PSYCHIATRIC: The patient is alert and oriented x 3.  SKIN: No obvious rash, lesion, or ulcer.   LABORATORY PANEL:   CBC Recent Labs  Lab 09/09/17 0949  WBC 5.7  HGB 10.4*  HCT 32.3*  PLT 132*   ------------------------------------------------------------------------------------------------------------------  Chemistries  Recent Labs  Lab 09/09/17 0949  NA 136  K 4.6  CL 97*  CO2 32   GLUCOSE 98  BUN 19  CREATININE 1.00  CALCIUM 8.7*  AST 23  ALT 17  ALKPHOS 69  BILITOT 1.2   ------------------------------------------------------------------------------------------------------------------  Cardiac Enzymes Recent Labs  Lab 09/09/17 0949  TROPONINI <0.03   ------------------------------------------------------------------------------------------------------------------  RADIOLOGY:  Ct Angio Chest Pe W And/or Wo Contrast  Result Date: 09/09/2017 CLINICAL DATA:  Shortness of breath and increased oxygen saturation EXAM: CT ANGIOGRAPHY CHEST WITH CONTRAST TECHNIQUE: Multidetector CT imaging of the chest was performed using the standard protocol during bolus administration of intravenous contrast. Multiplanar CT image  reconstructions and MIPs were obtained to evaluate the vascular anatomy. CONTRAST:  75mL ISOVUE-370 IOPAMIDOL (ISOVUE-370) INJECTION 76% COMPARISON:  Chest CT March 23, 2016 and chest radiograph September 09, 2017 FINDINGS: Cardiovascular: There is no demonstrable pulmonary embolus. There is no appreciable thoracic aortic aneurysm or dissection. There is mild calcification in the proximal left subclavian artery. Other visualized great vessels appear normal. There are foci of atherosclerotic calcification in the aorta. There are multiple foci of coronary artery calcification present. There is no appreciable pericardial effusion or pericardial thickening. There is prominence of the main pulmonary outflow tract measuring 4.4 cm in diameter, a finding indicative of pulmonary arterial hypertension. Mediastinum/Nodes: Thyroid appears unremarkable. There is no appreciable thoracic adenopathy by size criteria. Multiple small axillary lymph nodes are noted, somewhat more on the left than on the right. No esophageal lesions are appreciable. Lungs/Pleura: There is airspace consolidation in portions of the medial and posterior segments of the left lower lobe. There is also  consolidation in the lateral segment of the right middle lobe. There is apparent atelectatic change in the inferior lingula next to the left heart border. Upper Abdomen: There is cholelithiasis in the gallbladder. The gallbladder wall does not appear appreciably thickened. There is aortic atherosclerosis with a stent in the aorta, incompletely visualized. There is also visualized great vessel calcification. Visualized upper abdominal structures otherwise appear unremarkable. Musculoskeletal: There is postoperative change in the lower cervical spine. There are no blastic or lytic bone lesions. No evident chest wall lesion. Review of the MIP images confirms the above findings. IMPRESSION: 1. No demonstrable pulmonary embolus. No thoracic aortic aneurysm or dissection. 2. Enlargement of the main pulmonary outflow tract noted, a finding consistent with pulmonary arterial hypertension. 3. Areas of airspace consolidation consistent with pneumonia in the lateral segment right middle lobe as well as in the posteromedial segments of the right lower lobe. There is atelectatic change in the inferior lingula. 4.  No adenopathy by size criteria. 5.  Cholelithiasis. 6. Aortic atherosclerosis as well as foci of great vessel and coronary artery calcification. Aortic Atherosclerosis (ICD10-I70.0). Electronically Signed   By: Bretta Bang III M.D.   On: 09/09/2017 11:37   Dg Chest Port 1 View  Result Date: 09/09/2017 CLINICAL DATA:  Swelling lower extremities.  Difficulty breathing. EXAM: PORTABLE CHEST 1 VIEW COMPARISON:  Chest x-ray 06/24/2017. FINDINGS: Cardiomegaly. Normal pulmonary vascularity. No focal infiltrate. Stable elevation right hemidiaphragm. 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 Cervicothoracic spine fusion. IMPRESSION: 1. Stable cardiomegaly. No pulmonary venous congestion. No acute pulmonary disease. 2. Stable elevation of the right hemidiaphragm with interposition of the  right colon under the hemidiaphragm. Electronically Signed   By: Maisie Fus  Register   On: 09/09/2017 10:11    EKG:   Orders placed or performed during the hospital encounter of 09/09/17  . EKG 12-Lead  . EKG 12-Lead  . EKG 12-Lead  . EKG 12-Lead    IMPRESSION AND PLAN:   Chad Huffman  is a 72 y.o. male with a known history of COPD sees Dr. Darrol Angel as an outpatient, chronically lives on 3 L of oxygen via nasal cannula during nighttime and as needed basis, essential hypertension and dilated cardiomyopathy and other medical problems is presenting to the ED with a chief complaint of shortness of breath and found to have hypoxia.  CT angiogram of the chest has revealed right-sided multilobar pneumonia And pulmonary hypertension  #Community-acquired pneumonia-right-sided Admit patient to MedSurg unit Sputum culture and sensitivity Rocephin and  azithromycin  #Acute exacerbation of COPD Continue 3 L of oxygen and patient chronically lives on 3 L of oxygen Solu-Medrol, bronchodilator treatment Follow-up with Dr. Darrol Angel as an outpatient Patient has received magnesium sulfate 2 g IV in the ED  #Possible urinary tract infection Urine culture and sensitivity sent IV Rocephin  #Bilateral lower extremity cellulitis with edema Bilateral lower extremity venous Dopplers to rule out underlying DVT is ordered IV Rocephin Keep legs elevated Discontinue Norvasc as the patient thinks Norvasc is making the lower extremity edema worse  #Essential hypertension continue metoprolol  #Hypothyroidism continue Synthyroid  #History of dilated cardiomyopathy Recent ejection fraction in January 2018-60% Follow-up with cardiology as recommended  Provide GI and DVT prophylaxis  All the records are reviewed and case discussed with ED provider. Management plans discussed with the patient, family and they are in agreement.  CODE STATUS: fc, brother winson  TOTAL TIME TAKING CARE OF THIS PATIENT: 47  minutes.   Note: This dictation was prepared with Dragon dictation along with smaller phrase technology. Any transcriptional errors that result from this process are unintentional.  Ramonita Lab M.D on 09/09/2017 at 1:18 PM  Between 7am to 6pm - Pager - [phone]  After 6pm go to [email or web address] - password [email or web address]  [facility]  Office  [phone]  CC: Primary care physician; Dorothey Baseman, MD

## 2017-09-09 NOTE — Plan of Care (Signed)
  Problem: Education: Goal: Knowledge of General Education information will improve Outcome: Progressing   Problem: Safety: Goal: Ability to remain free from injury will improve Outcome: Progressing   Problem: Activity: Goal: Ability to tolerate increased activity will improve Outcome: Progressing   Problem: Clinical Measurements: Goal: Ability to maintain a body temperature in the normal range will improve Outcome: Progressing   Problem: Respiratory: Goal: Ability to maintain adequate ventilation will improve Outcome: Progressing Goal: Ability to maintain a clear airway will improve Outcome: Progressing   Problem: Education: Goal: Knowledge of disease or condition will improve Outcome: Progressing Goal: Knowledge of the prescribed therapeutic regimen will improve Outcome: Progressing   

## 2017-09-09 NOTE — ED Provider Notes (Signed)
[facility] Emergency Department Provider Note  ____________________________________________  Time seen: Approximately 9:47 AM  I have reviewed the triage vital signs and the nursing notes.   HISTORY  Chief Complaint Shortness of Breath    HPI Chad Huffman is a 72 y.o. male who presents with worsening shortness of breath, gradually over the past month, associated with swelling of bilateral lower extremities. He has a history of COPD and hypertension. He states that the symptoms began after he started taking amlodipine for his hypertension one month ago. worse with lying down supine. Patient were comfortable sitting upright. No other alleviating factors. Denies fever or chills. Rates shortness of breath as severe.  He normally uses nasal cannula oxygen at night, does not needed during the day. His pulmonologist is Dr. Sung Amabile.      Past Medical History:  Diagnosis Date  . Antalgic gait   . Carotid artery occlusion   . Chronic neck and back pain   . Complication of anesthesia    pt has had cervical fusion-limited neck flextion  . COPD (chronic obstructive pulmonary disease) (HCC)   . Depression   . DJD (degenerative joint disease)    Right shoulder  . Hypertension   . Lumbago   . Peripheral neuropathy      Patient Active Problem List   Diagnosis Date Noted  . Adjustment disorder with mixed anxiety and depressed mood   . Hyponatremia 06/24/2017  . Hematemesis without nausea   . Pressure injury of skin 09/14/2016  . Weakness 09/13/2016  . UTI (urinary tract infection) 09/13/2016  . Chronic diastolic CHF (congestive heart failure) (HCC) 09/13/2016  . COPD (chronic obstructive pulmonary disease) (HCC) 09/13/2016  . Carotid stenosis 09/09/2016  . Carotid artery stenosis 08/23/2016  . Pancytopenia (HCC) 08/23/2016  . Hyperkalemia 08/23/2016  . Leukocytosis 07/27/2016  . Thrombocytopenia (HCC) 07/27/2016  . Anemia 07/27/2016  . Status post  insertion of percutaneous endoscopic gastrostomy (PEG) tube (HCC) 05/30/2016  . Dysphagia, pharyngoesophageal phase 05/18/2016  . GERD without esophagitis 05/09/2016  . Bilateral lower extremity edema 05/09/2016  . Slow transit constipation 05/09/2016  . Depression with anxiety 05/09/2016  . Respiratory failure with hypoxia and hypercapnia (HCC) 07/22/2015  . Cervical vertebral fusion   . Depression   . Chronic neck and back pain   . COPD exacerbation (HCC)   . HCAP (healthcare-associated pneumonia)   . Congestive dilated cardiomyopathy (HCC)   . Syncope and collapse   . Arthritis of right hip 08/04/2014  . Essential hypertension 08/04/2014  . Hypothyroidism 08/04/2014  . Syncope 08/04/2014  . Fall 08/04/2014     Past Surgical History:  Procedure Laterality Date  . ABDOMINAL AORTIC ANEURYSM REPAIR  2009   Endovascular AAA repair  . ABDOMINAL AORTIC ANEURYSM REPAIR    . CAROTID ANGIOGRAPHY  09/09/2016   Procedure: Carotid Angiography;  Surgeon: Nada Libman, MD;  Location: [facility] INVASIVE CV LAB;  Service: Cardiovascular;;  rt. carotid  . CARPAL TUNNEL RELEASE     bilateral CTS  . ESOPHAGOGASTRODUODENOSCOPY (EGD) WITH PROPOFOL N/A 09/28/2016   Procedure: ESOPHAGOGASTRODUODENOSCOPY (EGD) WITH PROPOFOL;  Surgeon: Midge Minium, MD;  Location: [facility] ENDOSCOPY;  Service: Endoscopy;  Laterality: N/A;  . HAMMER TOE SURGERY  12/10/2011   Procedure: HAMMER TOE CORRECTION;  Surgeon: Ernestene Kiel, DPM;  Location: [facility];  Service: Podiatry;  Laterality: Right;  Right hammertoe repair second right with 2.5x42 orthopro screw   . IR GENERIC HISTORICAL  04/05/2016   IR GASTROSTOMY TUBE  MOD SED 04/05/2016 [facility]  . IR GENERIC HISTORICAL  05/12/2016   IR CM INJ ANY COLONIC TUBE W/FLUORO 05/12/2016 Malachy Moan, MD [facility]  . IR GENERIC HISTORICAL  05/26/2016   IR CM INJ ANY COLONIC TUBE W/FLUORO 05/26/2016 [facility]  . IR GENERIC HISTORICAL  05/28/2016   IR REPLC  GASTRO/COLONIC TUBE PERCUT W/FLUORO 05/28/2016 Brayton El, PA-C [facility]  . IR GENERIC HISTORICAL  07/02/2016   IR GASTROSTOMY TUBE REMOVAL 07/02/2016 Brayton El, PA-C [facility]  . METATARSAL OSTEOTOMY  12/10/2011   Procedure: METATARSAL OSTEOTOMY;  Surgeon: Ernestene Kiel, DPM;  Location: [facility];  Service: Podiatry;  Laterality: Right;  Right Keller arthroplasty with size 5 silicone implant right great toe; second metatarsal osteotomy with 2.0x14 screw  . NECK SURGERY    . PERIPHERAL VASCULAR CATHETERIZATION N/A 02/11/2015   Procedure: Carotid Angiography;  Surgeon: Nada Libman, MD;  Location: [facility] INVASIVE CV LAB;  Service: Cardiovascular;  Laterality: N/A;  . PILONIDAL CYST / SINUS EXCISION    . Rotary cuff    . SPINE SURGERY  09/15/11   Scar tissue removed- back  . SPINE SURGERY  09/15/11   Scar tissue removed- back  . TONSILLECTOMY    . TRACHEOSTOMY TUBE PLACEMENT N/A 03/03/2016   Procedure: TRACHEOSTOMY;  Surgeon: Geanie Logan, MD;  Location: [facility] ORS;  Service: ENT;  Laterality: N/A;     Prior to Admission medications   Medication Sig Start Date End Date Taking? Authorizing Provider  amLODipine (NORVASC) 5 MG tablet Take 1 tablet (5 mg total) by mouth daily. 06/27/17  Yes Shaune Pollack, MD  aspirin EC 81 MG tablet Take 81 mg by mouth daily.   Yes [provider]  baclofen (LIORESAL) 10 MG tablet Take 10 mg by mouth 3 (three) times daily.   Yes [provider]  clonazePAM (KLONOPIN) 0.5 MG tablet Take 0.5 tablets (0.25 mg total) by mouth 2 (two) times daily for 5 days. 06/27/17 09/09/17 Yes Shaune Pollack, MD  clopidogrel (PLAVIX) 75 MG tablet Take 75 mg by mouth daily.   Yes [provider]  desonide (DESOWEN) 0.05 % cream Apply 1 application topically 2 (two) times daily.   Yes [provider]  folic acid (FOLVITE) 1 MG tablet Take 1 mg by mouth daily.    Yes [provider]  [facility] Lanius Omega-3 500 MG CAPS Take 1 tablet by  mouth daily.   Yes [provider]  levothyroxine (SYNTHROID, LEVOTHROID) 75 MCG tablet Take 1 tablet (75 mcg total) by mouth daily before breakfast. Patient taking differently: Take 200 mcg by mouth daily before breakfast.  09/16/16  Yes Enid Baas, MD  metoprolol tartrate (LOPRESSOR) 25 MG tablet Take 1 tablet (25 mg total) by mouth 2 (two) times daily. 07/27/16  Yes Katharina Caper, MD  Multiple Vitamin (MULTIVITAMIN WITH MINERALS) TABS tablet Take 1 tablet by mouth daily.   Yes [provider]  sertraline (ZOLOFT) 100 MG tablet Take 1 tablet (100 mg total) by mouth daily. 06/27/17  Yes Shaune Pollack, MD  SYMBICORT 160-4.5 MCG/ACT inhaler TAKE 2 PUFFS BY MOUTH TWICE A DAY 06/01/17  Yes Merwyn Katos, MD  budesonide (PULMICORT) 0.5 MG/2ML nebulizer solution Take 2 mLs (0.5 mg total) by nebulization 2 (two) times daily. 12/21/16   Merwyn Katos, MD  clonazePAM (KLONOPIN) 0.5 MG tablet Take 0.5 tablets (0.25 mg total) by mouth daily for 7 days. Patient not taking: Reported on 09/09/2017 07/03/17 07/10/17  Shaune Pollack,  MD  HYDROcodone-acetaminophen (NORCO/VICODIN) 5-325 MG tablet Take 1 tablet by mouth every 4 (four) hours as needed. Patient not taking: Reported on 09/09/2017 06/27/17   Shaune Pollackhen, Qing, MD  ipratropium-albuterol (DUONEB) 0.5-2.5 (3) MG/3ML SOLN Take 3 mLs by nebulization every 6 (six) hours as needed. Dx:J44.9 12/21/16   Merwyn KatosSimonds, [name] B, MD  ketoconazole (NIZORAL) 2 % shampoo Apply 1 application topically daily as needed.    [provider]  OXYGEN 4 L/min by Intratracheal route as needed (for shortness of breath).     [provider]     Allergies Ampicillin; Nortriptyline; Brimonidine; Cyclobenzaprine; Tizanidine; and Gabapentin   Family History  Problem Relation Age of Onset  . Stomach cancer Mother   . Hypertension Brother   . Heart attack Father     Social History Social History   Tobacco Use  . Smoking status: Former Smoker     Packs/day: 1.00    Years: 30.00    Pack years: 30.00    Types: Cigarettes    Last attempt to quit: 08/30/2006    Years since quitting: 11.0  . Smokeless tobacco: Never Used  Substance Use Topics  . Alcohol use: Yes    Alcohol/week: 1.2 oz    Types: 2 Cans of beer per week    Comment: occasional use  . Drug use: No    Review of Systems  Constitutional:   No fever or chills.  ENT:   No sore throat. No rhinorrhea. Cardiovascular:   No chest pain or syncope. Respiratory:   positive shortness of breath without cough. Gastrointestinal:   Negative for abdominal pain, vomiting and diarrhea.  Musculoskeletal:   positive bilateral lower extremity swelling. All other systems reviewed and are negative except as documented above in ROS and HPI.  ____________________________________________   PHYSICAL EXAM:  VITAL SIGNS: ED Triage Vitals  Enc Vitals Group     BP 09/09/17 0944 (!) 167/82     Pulse Rate 09/09/17 0944 78     Resp 09/09/17 0944 (!) 22     Temp 09/09/17 0944 98 F (36.7 C)     Temp Source 09/09/17 0944 Oral     SpO2 09/09/17 0944 96 % 3L [place]     Weight 09/09/17 0946 235 lb (106.6 kg)     Height 09/09/17 0946 5' 8.5" (1.74 m)     Head Circumference --      Peak Flow --      Pain Score 09/09/17 0946 8     Pain Loc --      Pain Edu? --      Excl. in GC? --     Vital signs reviewed, nursing assessments reviewed. oxygen saturation 79% on room air while sitting upright  Constitutional:   Alert and oriented. mild respiratory distress. Eyes:   Conjunctivae are normal. EOMI. PERRL. ENT      Head:   Normocephalic and atraumatic.      Nose:   No congestion/rhinnorhea.       Mouth/Throat:   MMM, no pharyngeal erythema. No peritonsillar mass.       Neck:   No meningismus. Full ROM.or JVD Hematological/Lymphatic/Immunilogical:   No cervical lymphadenopathy. Cardiovascular:   irregularly irregular rhythm, rate approximately 80. Symmetric bilateral radial and DP pulses.  No  murmurs.  Respiratory:   tachypnea. Diminished air entry in all lung fields, symmetric. crackles in bilateral bases. No wheezing.. Gastrointestinal:   Soft and nontender. Non distended. There is no CVA tenderness.  No rebound, rigidity, or  guarding. Genitourinary:   deferred Musculoskeletal:   Normal range of motion in all extremities. No joint effusions.  No lower extremity tenderness. 2+ pitting edema bilateral lower extremities, symmetric Neurologic:   Normal speech and language.  Motor grossly intact. No acute focal neurologic deficits are appreciated.  Skin:    Skin is warm, dry and intact. faint erythema in the distribution of the edematous lower extremities without other inflammatory changes.  ____________________________________________    LABS (pertinent positives/negatives) (all labs ordered are listed, but only abnormal results are displayed) Labs Reviewed  COMPREHENSIVE METABOLIC PANEL - Abnormal; Notable for the following components:      Result Value   Chloride 97 (*)    Calcium 8.7 (*)    All other components within normal limits  CBC WITH DIFFERENTIAL/PLATELET - Abnormal; Notable for the following components:   RBC 3.38 (*)    Hemoglobin 10.4 (*)    HCT 32.3 (*)    RDW 16.9 (*)    Platelets 132 (*)    Lymphs Abs 0.6 (*)    All other components within normal limits  APTT - Abnormal; Notable for the following components:   aPTT 41 (*)    All other components within normal limits  URINALYSIS, COMPLETE (UACMP) WITH MICROSCOPIC - Abnormal; Notable for the following components:   Color, Urine YELLOW (*)    APPearance CLEAR (*)    Leukocytes, UA MODERATE (*)    All other components within normal limits  BLOOD GAS, VENOUS - Abnormal; Notable for the following components:   pCO2, Ven 80 (*)    All other components within normal limits  CULTURE, BLOOD (ROUTINE X 2)  CULTURE, BLOOD (ROUTINE X 2)  URINE CULTURE  LACTIC ACID, PLASMA  LIPASE, BLOOD  TROPONIN I   PROCALCITONIN  PROTIME-INR   ____________________________________________   EKG  interpreted by me atrial fibrillation rate of 77, normal axis and intervals. Right bundle-branch block. Normal ST segments and T waves. 3 PVCs on the strip  ____________________________________________    RADIOLOGY  Ct Angio Chest Pe W And/or Wo Contrast  Result Date: 09/09/2017 CLINICAL DATA:  Shortness of breath and increased oxygen saturation EXAM: CT ANGIOGRAPHY CHEST WITH CONTRAST TECHNIQUE: Multidetector CT imaging of the chest was performed using the standard protocol during bolus administration of intravenous contrast. Multiplanar CT image reconstructions and MIPs were obtained to evaluate the vascular anatomy. CONTRAST:  75mL ISOVUE-370 IOPAMIDOL (ISOVUE-370) INJECTION 76% COMPARISON:  Chest CT March 23, 2016 and chest radiograph September 09, 2017 FINDINGS: Cardiovascular: There is no demonstrable pulmonary embolus. There is no appreciable thoracic aortic aneurysm or dissection. There is mild calcification in the proximal left subclavian artery. Other visualized great vessels appear normal. There are foci of atherosclerotic calcification in the aorta. There are multiple foci of coronary artery calcification present. There is no appreciable pericardial effusion or pericardial thickening. There is prominence of the main pulmonary outflow tract measuring 4.4 cm in diameter, a finding indicative of pulmonary arterial hypertension. Mediastinum/Nodes: Thyroid appears unremarkable. There is no appreciable thoracic adenopathy by size criteria. Multiple small axillary lymph nodes are noted, somewhat more on the left than on the right. No esophageal lesions are appreciable. Lungs/Pleura: There is airspace consolidation in portions of the medial and posterior segments of the left lower lobe. There is also consolidation in the lateral segment of the right middle lobe. There is apparent atelectatic change in the inferior  lingula next to the left heart border. Upper Abdomen: There is cholelithiasis in the gallbladder. The  gallbladder wall does not appear appreciably thickened. There is aortic atherosclerosis with a stent in the aorta, incompletely visualized. There is also visualized great vessel calcification. Visualized upper abdominal structures otherwise appear unremarkable. Musculoskeletal: There is postoperative change in the lower cervical spine. There are no blastic or lytic bone lesions. No evident chest wall lesion. Review of the MIP images confirms the above findings. IMPRESSION: 1. No demonstrable pulmonary embolus. No thoracic aortic aneurysm or dissection. 2. Enlargement of the main pulmonary outflow tract noted, a finding consistent with pulmonary arterial hypertension. 3. Areas of airspace consolidation consistent with pneumonia in the lateral segment right middle lobe as well as in the posteromedial segments of the right lower lobe. There is atelectatic change in the inferior lingula. 4.  No adenopathy by size criteria. 5.  Cholelithiasis. 6. Aortic atherosclerosis as well as foci of great vessel and coronary artery calcification. Aortic Atherosclerosis (ICD10-I70.0). Electronically Signed   By: Bretta Bang III M.D.   On: 09/09/2017 11:37   Dg Chest Port 1 View  Result Date: 09/09/2017 CLINICAL DATA:  Swelling lower extremities.  Difficulty breathing. EXAM: PORTABLE CHEST 1 VIEW COMPARISON:  Chest x-ray 06/24/2017. FINDINGS: Cardiomegaly. Normal pulmonary vascularity. No focal infiltrate. Stable elevation right hemidiaphragm. 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 Cervicothoracic spine fusion. IMPRESSION: 1. Stable cardiomegaly. No pulmonary venous congestion. No acute pulmonary disease. 2. Stable elevation of the right hemidiaphragm with interposition of the right colon under the hemidiaphragm. Electronically Signed   By: Maisie Fus  Register   On: 09/09/2017 10:11     ____________________________________________   PROCEDURES .Critical Care Performed by: Sharman Cheek, MD Authorized by: Sharman Cheek, MD   Critical care provider statement:    Critical care time (minutes):  35   Critical care time was exclusive of:  Separately billable procedures and treating other patients   Critical care was necessary to treat or prevent imminent or life-threatening deterioration of the following conditions:  Respiratory failure   Critical care was time spent personally by me on the following activities:  Development of treatment plan with patient or surrogate, discussions with consultants, evaluation of patient's response to treatment, examination of patient, obtaining history from patient or surrogate, ordering and performing treatments and interventions, ordering and review of laboratory studies, ordering and review of radiographic studies, pulse oximetry, re-evaluation of patient's condition and review of old charts    ____________________________________________  DIFFERENTIAL DIAGNOSIS   non-STEMI, acute pulmonary edema, heart failure, amlodipine side effect, COPD exacerbation  CLINICAL IMPRESSION / ASSESSMENT AND PLAN / ED COURSE  Pertinent labs & imaging results that were available during my care of the patient were reviewed by me and considered in my medical decision making (see chart for details).    patient presents with severe hypoxia, 70% on room air. EMS. On arrival in the ED his room air oxygen saturation is 79%. He was put on nasal cannula with improvement. Exam is most concerning for heart failure and pulmonary edema. Check labs and VBG. Presentation is not consistent with sepsis at this time, I doubt the patient has pneumonia, I will pursue a sepsis workup for further screening at this time, and anticipate the patient will require hospitalization for oxygen support and diuresis.  Clinical Course as of Sep 10 1202  Fri Sep 09, 2017   1025 Chronic hypercapnea, compensated. Mental status intact, no narcosis  pCO2, Ven(!!): 80 [PS]  1027 Chest xray unremarkable except cardiomegaly. Labs unremarkable. Will obtain CT chest and plan to admit.    [  PS]  1157 CTA chest negative for PE. Scan actually reveals an occult pneumonia that was not apparent on exam or chest xray. Given new findings, presentation is now indicative of acute respiratory failure and SIRS secondary to multifocal pneumonia. Meets criteria for sepsis. Lactate normal.not in shock. Needs abx. I will treatment with ceftriaxone and azithromycin. Will admit.    [PS]    Clinical Course User Index [PS] Sharman Cheek, MD     ____________________________________________   FINAL CLINICAL IMPRESSION(S) / ED DIAGNOSES    Final diagnoses:  Acute respiratory failure with hypoxia and hypercapnia (HCC)  Multifocal pneumonia  Sepsis, due to unspecified organism [facility])  Peripheral edema     ED Discharge Orders    None      Portions of this note were generated with dragon dictation software. Dictation errors may occur despite best attempts at proofreading.    Sharman Cheek, MD 09/09/17 [phone]

## 2017-09-09 NOTE — Progress Notes (Signed)
Family Meeting Note  Advance Directive:yes  Today a meeting took place with the Patient.    The following clinical team members were present during this meeting:MD  The following were discussed:Patient's diagnosis: Right-sided pneumonia, acute exacerbation of COPD, bilateral lower extremity cellulitis, possible urinary tract infection, essential hypertension, dilated cardiomyopathy and other medical problems and treatment plan of care was discussed in detail with the patient.  He verbalized understanding of the plan.  Patient's progosis: Unable to determine and Goals for treatment: Full Code, brother Milagros ReapWinson Minturn is a healthcare POA  Additional follow-up to be provided: Hospitalist  Time spent during discussion:18 min  Ramonita LabAruna Juri Dinning, MD

## 2017-09-09 NOTE — Progress Notes (Signed)
Pharmacy Antibiotic Note  Chad Huffman is a 72 y.o. male admitted on 09/09/2017 with pneumonia.  Pharmacy has been consulted for CTX and azithromycin dosing.  Plan: Azithromycin ordered per MD x 5 days.   Ceftriaxone 1 g iv q 24 hours.   Height: 5' 8.5" (174 cm) Weight: 235 lb (106.6 kg) IBW/kg (Calculated) : 69.55  Temp (24hrs), Avg:98.1 F (36.7 C), Min:98 F (36.7 C), Max:98.1 F (36.7 C)  Recent Labs  Lab 09/09/17 0949  WBC 5.7  CREATININE 1.00  LATICACIDVEN 0.8    Estimated Creatinine Clearance: 82.1 mL/min (by C-G formula based on SCr of 1 mg/dL).    Allergies  Allergen Reactions  . Ampicillin Swelling and Other (See Comments)    Unable to obtain enough information to answer additional questions about this medication.  Has patient had a PCN reaction causing immediate rash, facial/tongue/throat swelling, SOB or lightheadedness with hypotension: Yes Has patient had a PCN reaction causing severe rash involving mucus membranes or skin necrosis: No Has patient had a PCN reaction that required hospitalization No Has patient had a PCN reaction occurring within the last 10 years: Yes If all of the above answers are "NO", then may pr  . Nortriptyline Other (See Comments)    Sleep walking  . Brimonidine Other (See Comments)    Unknown reaction per MAR   . Cyclobenzaprine Other (See Comments)    Hallucinations  . Tizanidine Other (See Comments)    Hallucinations  . Gabapentin Rash    Antimicrobials this admission: Azithromycin 3/22 >>  CTX 3/22 >>   Dose adjustments this admission:   Microbiology results: 3/22 BCx: sent 3/22 UCx: sent  Sputum: sent   Thank you for allowing pharmacy to be a part of this patient's care.  Luisa HartChristy, [name] D 09/09/2017 4:19 PM

## 2017-09-09 NOTE — ED Triage Notes (Signed)
Pt arrived via EMS from [facility] for c/o increased shortness of breath. Initial oxygen sat was 70% on room air.  Pt wears oxygen PRN at night. Pt has had increased swelling from amlodipine. Pt also reports recent cold.  Pt alert and oriented on arrival.  Swelling noted in lower and upper extremities.

## 2017-09-09 NOTE — ED Notes (Addendum)
Wife at bedside. Patient in CT scan at this time.

## 2017-09-09 NOTE — ED Notes (Signed)
XR in room 

## 2017-09-10 LAB — URINE CULTURE

## 2017-09-10 LAB — CBC
HCT: 32 % — ABNORMAL LOW (ref 40.0–52.0)
Hemoglobin: 10.4 g/dL — ABNORMAL LOW (ref 13.0–18.0)
MCH: 30.9 pg (ref 26.0–34.0)
MCHC: 32.5 g/dL (ref 32.0–36.0)
MCV: 94.9 fL (ref 80.0–100.0)
Platelets: 136 10*3/uL — ABNORMAL LOW (ref 150–440)
RBC: 3.37 MIL/uL — ABNORMAL LOW (ref 4.40–5.90)
RDW: 16.3 % — AB (ref 11.5–14.5)
WBC: 3.3 10*3/uL — AB (ref 3.8–10.6)

## 2017-09-10 LAB — COMPREHENSIVE METABOLIC PANEL
ALK PHOS: 67 U/L (ref 38–126)
ALT: 17 U/L (ref 17–63)
AST: 25 U/L (ref 15–41)
Albumin: 3.6 g/dL (ref 3.5–5.0)
Anion gap: 7 (ref 5–15)
BUN: 17 mg/dL (ref 6–20)
CHLORIDE: 96 mmol/L — AB (ref 101–111)
CO2: 32 mmol/L (ref 22–32)
CREATININE: 0.79 mg/dL (ref 0.61–1.24)
Calcium: 8.5 mg/dL — ABNORMAL LOW (ref 8.9–10.3)
GFR calc Af Amer: >60 mL/min (ref >60)
Glucose, Bld: 148 mg/dL — ABNORMAL HIGH (ref 65–99)
Potassium: 5 mmol/L (ref 3.5–5.1)
Sodium: 135 mmol/L (ref 135–145)
Total Bilirubin: 0.9 mg/dL (ref 0.3–1.2)
Total Protein: 6.6 g/dL (ref 6.5–8.1)

## 2017-09-10 MED ORDER — METHYLPREDNISOLONE SODIUM SUCC 125 MG IJ SOLR
60.0000 mg | Freq: Two times a day (BID) | INTRAMUSCULAR | Status: DC
  Administered 2017-09-10 – 2017-09-12 (×4): 60 mg via INTRAVENOUS
  Filled 2017-09-10 (×4): qty 2

## 2017-09-10 MED ORDER — FUROSEMIDE 10 MG/ML IJ SOLN
60.0000 mg | Freq: Once | INTRAMUSCULAR | Status: AC
  Administered 2017-09-10: 15:00:00 60 mg via INTRAVENOUS
  Filled 2017-09-10: qty 6

## 2017-09-10 MED ORDER — DIPHENHYDRAMINE HCL 25 MG PO CAPS
25.0000 mg | ORAL_CAPSULE | Freq: Every evening | ORAL | Status: DC | PRN
Start: 2017-09-10 — End: 2017-09-13
  Administered 2017-09-10: 01:00:00 25 mg via ORAL
  Filled 2017-09-10 (×2): qty 1

## 2017-09-10 MED ORDER — HYDRALAZINE HCL 20 MG/ML IJ SOLN: 10.0000 mg | Freq: Four times a day (QID) | INTRAMUSCULAR | Status: DC | PRN

## 2017-09-10 MED ORDER — HYDROCHLOROTHIAZIDE 25 MG PO TABS
25.0000 mg | ORAL_TABLET | Freq: Every day | ORAL | Status: DC
  Administered 2017-09-10 – 2017-09-13 (×4): 25 mg via ORAL
  Filled 2017-09-10 (×5): qty 1

## 2017-09-10 NOTE — Progress Notes (Signed)
[facility] - [place] at [facility]   PATIENT NAME: Chad Huffman    MR#:  [id number]  DATE OF BIRTH:  Apr 24, 1946  SUBJECTIVE:  CHIEF COMPLAINT:   Chief Complaint  Patient presents with  . Shortness of Breath   Has some shortness of breath and cough.  On 3 L oxygen. Doris Mittie swelling.  REVIEW OF SYSTEMS:    Review of Systems  Constitutional: Positive for diaphoresis and weight loss. Negative for chills and fever.  HENT: Negative for sore throat.   Eyes: Negative for blurred vision, double vision and pain.  Respiratory: Positive for cough, shortness of breath and wheezing. Negative for hemoptysis.   Cardiovascular: Negative for chest pain, palpitations, orthopnea and leg swelling.  Gastrointestinal: Negative for abdominal pain, constipation, diarrhea, heartburn, nausea and vomiting.  Genitourinary: Negative for dysuria and hematuria.  Musculoskeletal: Negative for back pain and joint pain.  Skin: Negative for rash.  Neurological: Negative for sensory change, speech change, focal weakness and headaches.  Endo/Heme/Allergies: Does not bruise/bleed easily.  Psychiatric/Behavioral: Negative for depression. The patient is not nervous/anxious.     DRUG ALLERGIES:   Allergies  Allergen Reactions  . Ampicillin Swelling and Other (See Comments)    Unable to obtain enough information to answer additional questions about this medication.  Has patient had a PCN reaction causing immediate rash, facial/tongue/throat swelling, SOB or lightheadedness with hypotension: Yes Has patient had a PCN reaction causing severe rash involving mucus membranes or skin necrosis: No Has patient had a PCN reaction that required hospitalization No Has patient had a PCN reaction occurring within the last 10 years: Yes If all of the above answers are "NO", then may pr  . Nortriptyline Other (See Comments)    Sleep walking  . Brimonidine Other (See Comments)    Unknown reaction per MAR    . Cyclobenzaprine Other (See Comments)    Hallucinations  . Tizanidine Other (See Comments)    Hallucinations  . Gabapentin Rash    VITALS:  Blood pressure (!) 170/70, pulse 84, temperature 97.6 F (36.4 C), temperature source Oral, resp. rate 20, height 5' 8.5" (1.74 m), weight 106.6 kg (235 lb), SpO2 91 %.  PHYSICAL EXAMINATION:   Physical Exam  GENERAL:  72 y.o.-year-old patient lying in the bed with no acute distress.  EYES: Pupils equal, round, reactive to light and accommodation. No scleral icterus. Extraocular muscles intact.  HEENT: Head atraumatic, normocephalic. Oropharynx and nasopharynx clear.  NECK:  Supple, no jugular venous distention. No thyroid enlargement, no tenderness.  LUNGS: b/l wheezing CARDIOVASCULAR: S1, S2 normal. No murmurs, rubs, or gallops.  ABDOMEN: Soft, nontender, nondistended. Bowel sounds present. No organomegaly or mass.  EXTREMITIES: No cyanosis, clubbing. LE edema NEUROLOGIC: Cranial nerves II through XII are intact. No focal Motor or sensory deficits b/l.   PSYCHIATRIC: The patient is alert and oriented x 3.  SKIN: No obvious rash, lesion, or ulcer.   LABORATORY PANEL:   CBC Recent Labs  Lab 09/10/17 0444  WBC 3.3*  HGB 10.4*  HCT 32.0*  PLT 136*   ------------------------------------------------------------------------------------------------------------------ Chemistries  Recent Labs  Lab 09/10/17 0444  NA 135  K 5.0  CL 96*  CO2 32  GLUCOSE 148*  BUN 17  CREATININE 0.79  CALCIUM 8.5*  AST 25  ALT 17  ALKPHOS 67  BILITOT 0.9   ------------------------------------------------------------------------------------------------------------------  Cardiac Enzymes Recent Labs  Lab 09/09/17 0949  TROPONINI <0.03   ------------------------------------------------------------------------------------------------------------------  RADIOLOGY:  Ct Angio Chest Pe W And/or Wo  Contrast  Result Date: 09/09/2017 CLINICAL  DATA:  Shortness of breath and increased oxygen saturation EXAM: CT ANGIOGRAPHY CHEST WITH CONTRAST TECHNIQUE: Multidetector CT imaging of the chest was performed using the standard protocol during bolus administration of intravenous contrast. Multiplanar CT image reconstructions and MIPs were obtained to evaluate the vascular anatomy. CONTRAST:  75mL ISOVUE-370 IOPAMIDOL (ISOVUE-370) INJECTION 76% COMPARISON:  Chest CT March 23, 2016 and chest radiograph September 09, 2017 FINDINGS: Cardiovascular: There is no demonstrable pulmonary embolus. There is no appreciable thoracic aortic aneurysm or dissection. There is mild calcification in the proximal left subclavian artery. Other visualized great vessels appear normal. There are foci of atherosclerotic calcification in the aorta. There are multiple foci of coronary artery calcification present. There is no appreciable pericardial effusion or pericardial thickening. There is prominence of the main pulmonary outflow tract measuring 4.4 cm in diameter, a finding indicative of pulmonary arterial hypertension. Mediastinum/Nodes: Thyroid appears unremarkable. There is no appreciable thoracic adenopathy by size criteria. Multiple small axillary lymph nodes are noted, somewhat more on the left than on the right. No esophageal lesions are appreciable. Lungs/Pleura: There is airspace consolidation in portions of the medial and posterior segments of the left lower lobe. There is also consolidation in the lateral segment of the right middle lobe. There is apparent atelectatic change in the inferior lingula next to the left heart border. Upper Abdomen: There is cholelithiasis in the gallbladder. The gallbladder wall does not appear appreciably thickened. There is aortic atherosclerosis with a stent in the aorta, incompletely visualized. There is also visualized great vessel calcification. Visualized upper abdominal structures otherwise appear unremarkable. Musculoskeletal: There is  postoperative change in the lower cervical spine. There are no blastic or lytic bone lesions. No evident chest wall lesion. Review of the MIP images confirms the above findings. IMPRESSION: 1. No demonstrable pulmonary embolus. No thoracic aortic aneurysm or dissection. 2. Enlargement of the main pulmonary outflow tract noted, a finding consistent with pulmonary arterial hypertension. 3. Areas of airspace consolidation consistent with pneumonia in the lateral segment right middle lobe as well as in the posteromedial segments of the right lower lobe. There is atelectatic change in the inferior lingula. 4.  No adenopathy by size criteria. 5.  Cholelithiasis. 6. Aortic atherosclerosis as well as foci of great vessel and coronary artery calcification. Aortic Atherosclerosis (ICD10-I70.0). Electronically Signed   By: Bretta Bang III M.D.   On: 09/09/2017 11:37   US Venous Img Lower Bilateral  Result Date: 09/09/2017 CLINICAL DATA:  Cellulitis EXAM: BILATERAL LOWER EXTREMITY VENOUS DOPPLER ULTRASOUND TECHNIQUE: Gray-scale sonography with graded compression, as well as color Doppler and duplex ultrasound were performed to evaluate the lower extremity deep venous systems from the level of the common femoral vein and including the common femoral, femoral, profunda femoral, popliteal and calf veins including the posterior tibial, peroneal and gastrocnemius veins when visible. The superficial great saphenous vein was also interrogated. Spectral Doppler was utilized to evaluate flow at rest and with distal augmentation maneuvers in the common femoral, femoral and popliteal veins. COMPARISON:  04/19/2011 FINDINGS: RIGHT LOWER EXTREMITY Common Femoral Vein: No evidence of thrombus. Normal compressibility, respiratory phasicity and response to augmentation. Saphenofemoral Junction: No evidence of thrombus. Normal compressibility and flow on color Doppler imaging. Profunda Femoral Vein: No evidence of thrombus. Normal  compressibility and flow on color Doppler imaging. Femoral Vein: No evidence of thrombus. Normal compressibility, respiratory phasicity and response to augmentation. Popliteal Vein: No evidence of thrombus. Normal compressibility, respiratory phasicity and response  to augmentation. Calf Veins: No evidence of thrombus. Normal compressibility and flow on color Doppler imaging. Superficial Great Saphenous Vein: No evidence of thrombus. Normal compressibility. Venous Reflux:  None. Other Findings:  Scattered subcutaneous edema LEFT LOWER EXTREMITY Common Femoral Vein: No evidence of thrombus. Normal compressibility, respiratory phasicity and response to augmentation. Saphenofemoral Junction: No evidence of thrombus. Normal compressibility and flow on color Doppler imaging. Profunda Femoral Vein: No evidence of thrombus. Normal compressibility and flow on color Doppler imaging. Femoral Vein: No evidence of thrombus. Normal compressibility, respiratory phasicity and response to augmentation. Popliteal Vein: No evidence of thrombus. Normal compressibility, respiratory phasicity and response to augmentation. Calf Veins: No evidence of thrombus. Normal compressibility and flow on color Doppler imaging. Superficial Great Saphenous Vein: No evidence of thrombus. Normal compressibility. Venous Reflux:  None. Other Findings:  Scattered subcutaneous edema. IMPRESSION: No evidence of deep venous thrombosis in either lower extremity. Electronically Signed   By: Ulyses Southward M.D.   On: 09/09/2017 14:49   Dg Chest Port 1 View  Result Date: 09/09/2017 CLINICAL DATA:  Swelling lower extremities.  Difficulty breathing. EXAM: PORTABLE CHEST 1 VIEW COMPARISON:  Chest x-ray 06/24/2017. FINDINGS: Cardiomegaly. Normal pulmonary vascularity. No focal infiltrate. Stable elevation right hemidiaphragm. 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 Cervicothoracic spine fusion. IMPRESSION: 1. Stable cardiomegaly. No  pulmonary venous congestion. No acute pulmonary disease. 2. Stable elevation of the right hemidiaphragm with interposition of the right colon under the hemidiaphragm. Electronically Signed   By: Maisie Fus  Register   On: 09/09/2017 10:11     ASSESSMENT AND PLAN:   #Community-acquired pneumonia-right-sided with COPD exacerbation and acute on chronic resp failure -IV steroids, Antibiotics - Scheduled Nebulizers - Inhalers -Wean O2 as tolerated - Consult pulmonary if no improvement  #Essential hypertension Stopped norvasc due to edema. Started HCTZ  #Hypothyroidism continue Synthyroid  #History of dilated cardiomyopathy Recent ejection fraction in January 2018-60% Follow-up with cardiology as recommended  All the records are reviewed and case discussed with Care Management/Social Workerr. Management plans discussed with the patient, family and they are in agreement.  CODE STATUS: FULL CODE  DVT Prophylaxis: SCDs  TOTAL TIME TAKING CARE OF THIS PATIENT: 35 minutes.   POSSIBLE D/C IN 1-2 DAYS, DEPENDING ON CLINICAL CONDITION.  Molinda Bailiff Monserratt Knezevic M.D on 09/10/2017 at 1:46 PM  Between 7am to 6pm - Pager - [phone]  After 6pm go to [email or web address] - password [email or web address]  [facility] [place] [facility]  Office  [phone]  CC: Primary care physician; Dorothey Baseman, MD  Note: This dictation was prepared with Dragon dictation along with smaller phrase technology. Any transcriptional errors that result from this process are unintentional.

## 2017-09-10 NOTE — Progress Notes (Signed)
Took over pt care at 1500, pt with no complaints 

## 2017-09-11 MED ORDER — GUAIFENESIN-DM 100-10 MG/5ML PO SYRP
5.0000 mL | ORAL_SOLUTION | ORAL | Status: DC | PRN
  Administered 2017-09-11 – 2017-09-13 (×5): 5 mL via ORAL
  Filled 2017-09-11 (×7): qty 5

## 2017-09-11 MED ORDER — FUROSEMIDE 10 MG/ML IJ SOLN
60.0000 mg | Freq: Once | INTRAMUSCULAR | Status: AC
  Administered 2017-09-11: 60 mg via INTRAVENOUS
  Filled 2017-09-11: qty 6

## 2017-09-11 MED ORDER — IPRATROPIUM-ALBUTEROL 0.5-2.5 (3) MG/3ML IN SOLN
3.0000 mL | Freq: Three times a day (TID) | RESPIRATORY_TRACT | Status: DC
  Administered 2017-09-11 – 2017-09-13 (×7): 3 mL via RESPIRATORY_TRACT
  Filled 2017-09-11 (×7): qty 3

## 2017-09-11 NOTE — Plan of Care (Signed)
  Problem: Education: Goal: Knowledge of General Education information will improve Outcome: Progressing   Problem: Safety: Goal: Ability to remain free from injury will improve Outcome: Progressing   Problem: Activity: Goal: Ability to tolerate increased activity will improve Outcome: Progressing   Problem: Clinical Measurements: Goal: Ability to maintain a body temperature in the normal range will improve Outcome: Progressing   Problem: Respiratory: Goal: Ability to maintain adequate ventilation will improve Outcome: Progressing Goal: Ability to maintain a clear airway will improve Outcome: Progressing   Problem: Education: Goal: Knowledge of disease or condition will improve Outcome: Progressing Goal: Knowledge of the prescribed therapeutic regimen will improve Outcome: Progressing

## 2017-09-11 NOTE — Progress Notes (Signed)
[facility] - [place] [facility]   PATIENT NAME: Chad Huffman    MR#:  [id number]  DATE OF BIRTH:  04-14-1946  SUBJECTIVE:  CHIEF COMPLAINT:   Chief Complaint  Patient presents with  . Shortness of Breath   Continues to have shortness of breath.  On 3 L oxygen. Persistent lower extremity swelling  Uses oxygen as needed at home.  REVIEW OF SYSTEMS:    Review of Systems  Constitutional: Positive for diaphoresis and weight loss. Negative for chills and fever.  HENT: Negative for sore throat.   Eyes: Negative for blurred vision, double vision and pain.  Respiratory: Positive for cough, shortness of breath and wheezing. Negative for hemoptysis.   Cardiovascular: Negative for chest pain, palpitations, orthopnea and leg swelling.  Gastrointestinal: Negative for abdominal pain, constipation, diarrhea, heartburn, nausea and vomiting.  Genitourinary: Negative for dysuria and hematuria.  Musculoskeletal: Negative for back pain and joint pain.  Skin: Negative for rash.  Neurological: Negative for sensory change, speech change, focal weakness and headaches.  Endo/Heme/Allergies: Does not bruise/bleed easily.  Psychiatric/Behavioral: Negative for depression. The patient is not nervous/anxious.     DRUG ALLERGIES:   Allergies  Allergen Reactions  . Ampicillin Swelling and Other (See Comments)    Unable to obtain enough information to answer additional questions about this medication.  Has patient had a PCN reaction causing immediate rash, facial/tongue/throat swelling, SOB or lightheadedness with hypotension: Yes Has patient had a PCN reaction causing severe rash involving mucus membranes or skin necrosis: No Has patient had a PCN reaction that required hospitalization No Has patient had a PCN reaction occurring within the last 10 years: Yes If all of the above answers are "NO", then may pr  . Nortriptyline Other (See Comments)    Sleep walking  . Brimonidine Other  (See Comments)    Unknown reaction per MAR   . Cyclobenzaprine Other (See Comments)    Hallucinations  . Tizanidine Other (See Comments)    Hallucinations  . Gabapentin Rash    VITALS:  Blood pressure (!) 174/73, pulse (!) 57, temperature 97.6 F (36.4 C), temperature source Oral, resp. rate 20, height 5' 8.5" (1.74 m), weight 106.6 kg (235 lb), SpO2 93 %.  PHYSICAL EXAMINATION:   Physical Exam  GENERAL:  72 y.o.-year-old patient lying in the bed with no acute distress.  EYES: Pupils equal, round, reactive to light and accommodation. No scleral icterus. Extraocular muscles intact.  HEENT: Head atraumatic, normocephalic. Oropharynx and nasopharynx clear.  NECK:  Supple, no jugular venous distention. No thyroid enlargement, no tenderness.  LUNGS: b/l wheezing CARDIOVASCULAR: S1, S2 normal. No murmurs, rubs, or gallops.  ABDOMEN: Soft, nontender, nondistended. Bowel sounds present. No organomegaly or mass.  EXTREMITIES: No cyanosis, clubbing. LE edema NEUROLOGIC: Cranial nerves II through XII are intact. No focal Motor or sensory deficits b/l.   PSYCHIATRIC: The patient is alert and oriented x 3.  SKIN: No obvious rash, lesion, or ulcer.   LABORATORY PANEL:   CBC Recent Labs  Lab 09/10/17 0444  WBC 3.3*  HGB 10.4*  HCT 32.0*  PLT 136*   ------------------------------------------------------------------------------------------------------------------ Chemistries  Recent Labs  Lab 09/10/17 0444  NA 135  K 5.0  CL 96*  CO2 32  GLUCOSE 148*  BUN 17  CREATININE 0.79  CALCIUM 8.5*  AST 25  ALT 17  ALKPHOS 67  BILITOT 0.9   ------------------------------------------------------------------------------------------------------------------  Cardiac Enzymes Recent Labs  Lab 09/09/17 0949  TROPONINI <0.03   ------------------------------------------------------------------------------------------------------------------  RADIOLOGY:  US Venous Img Lower  Bilateral  Result Date: 09/09/2017 CLINICAL DATA:  Cellulitis EXAM: BILATERAL LOWER EXTREMITY VENOUS DOPPLER ULTRASOUND TECHNIQUE: Gray-scale sonography with graded compression, as well as color Doppler and duplex ultrasound were performed to evaluate the lower extremity deep venous systems from the level of the common femoral vein and including the common femoral, femoral, profunda femoral, popliteal and calf veins including the posterior tibial, peroneal and gastrocnemius veins when visible. The superficial great saphenous vein was also interrogated. Spectral Doppler was utilized to evaluate flow at rest and with distal augmentation maneuvers in the common femoral, femoral and popliteal veins. COMPARISON:  04/19/2011 FINDINGS: RIGHT LOWER EXTREMITY Common Femoral Vein: No evidence of thrombus. Normal compressibility, respiratory phasicity and response to augmentation. Saphenofemoral Junction: No evidence of thrombus. Normal compressibility and flow on color Doppler imaging. Profunda Femoral Vein: No evidence of thrombus. Normal compressibility and flow on color Doppler imaging. Femoral Vein: No evidence of thrombus. Normal compressibility, respiratory phasicity and response to augmentation. Popliteal Vein: No evidence of thrombus. Normal compressibility, respiratory phasicity and response to augmentation. Calf Veins: No evidence of thrombus. Normal compressibility and flow on color Doppler imaging. Superficial Great Saphenous Vein: No evidence of thrombus. Normal compressibility. Venous Reflux:  None. Other Findings:  Scattered subcutaneous edema LEFT LOWER EXTREMITY Common Femoral Vein: No evidence of thrombus. Normal compressibility, respiratory phasicity and response to augmentation. Saphenofemoral Junction: No evidence of thrombus. Normal compressibility and flow on color Doppler imaging. Profunda Femoral Vein: No evidence of thrombus. Normal compressibility and flow on color Doppler imaging. Femoral Vein:  No evidence of thrombus. Normal compressibility, respiratory phasicity and response to augmentation. Popliteal Vein: No evidence of thrombus. Normal compressibility, respiratory phasicity and response to augmentation. Calf Veins: No evidence of thrombus. Normal compressibility and flow on color Doppler imaging. Superficial Great Saphenous Vein: No evidence of thrombus. Normal compressibility. Venous Reflux:  None. Other Findings:  Scattered subcutaneous edema. IMPRESSION: No evidence of deep venous thrombosis in either lower extremity. Electronically Signed   By: Ulyses Southward M.D.   On: 09/09/2017 14:49     ASSESSMENT AND PLAN:   #Community-acquired pneumonia-right-sided with COPD exacerbation and acute on chronic resp failure -IV steroids, Antibiotics - Scheduled Nebulizers - Inhalers -Wean O2 as tolerated  #Essential hypertension Stopped norvasc due to edema. Started HCTZ  #Hypothyroidism continue Synthyroid  #History of dilated cardiomyopathy Recent ejection fraction in January 2018-60% Follow-up with cardiology as recommended  #Bilateral lower extremity edema.  Will give IV Lasix.  All the records are reviewed and case discussed with Care Management/Social Workerr. Management plans discussed with the patient, family and they are in agreement.  CODE STATUS: FULL CODE  DVT Prophylaxis: SCDs  TOTAL TIME TAKING CARE OF THIS PATIENT: 35 minutes.   POSSIBLE D/C IN 1-2 DAYS, DEPENDING ON CLINICAL CONDITION.  Molinda Bailiff Itzamara Casas M.D on 09/11/2017 at 11:55 AM  Between 7am to 6pm - Pager - [phone]  After 6pm go to [email or web address] - password [email or web address]  [facility] [place] [facility]  Office  [phone]  CC: Primary care physician; Dorothey Baseman, MD  Note: This dictation was prepared with Dragon dictation along with smaller phrase technology. Any transcriptional errors that result from this process are unintentional.

## 2017-09-12 LAB — BASIC METABOLIC PANEL
Anion gap: 6 (ref 5–15)
BUN: 37 mg/dL — ABNORMAL HIGH (ref 6–20)
CHLORIDE: 86 mmol/L — AB (ref 101–111)
CO2: 38 mmol/L — AB (ref 22–32)
CREATININE: 1.19 mg/dL (ref 0.61–1.24)
Calcium: 8.4 mg/dL — ABNORMAL LOW (ref 8.9–10.3)
GFR calc non Af Amer: >60 mL/min (ref >60)
Glucose, Bld: 116 mg/dL — ABNORMAL HIGH (ref 65–99)
POTASSIUM: 4.7 mmol/L (ref 3.5–5.1)
Sodium: 130 mmol/L — ABNORMAL LOW (ref 135–145)

## 2017-09-12 MED ORDER — LEVOTHYROXINE SODIUM 100 MCG PO TABS
200.0000 ug | ORAL_TABLET | Freq: Every day | ORAL | Status: DC
  Administered 2017-09-13: 05:00:00 200 ug via ORAL
  Filled 2017-09-12: qty 2

## 2017-09-12 MED ORDER — FUROSEMIDE 40 MG PO TABS
40.0000 mg | ORAL_TABLET | Freq: Once | ORAL | Status: AC
  Administered 2017-09-12: 12:00:00 40 mg via ORAL
  Filled 2017-09-12: qty 1

## 2017-09-12 MED ORDER — PREDNISONE 50 MG PO TABS
50.0000 mg | ORAL_TABLET | Freq: Every day | ORAL | Status: DC
  Administered 2017-09-13: 08:00:00 50 mg via ORAL
  Filled 2017-09-12: qty 1

## 2017-09-12 NOTE — Care Management Important Message (Signed)
Important Message  Patient Details  Name: Chad Huffman MRN: [id number] Date of Birth: 22-Mar-1946   Medicare Important Message Given:  Yes    Gwenette GreetBrenda S Cheryn Lundquist, RN 09/12/2017, 3:12 PM

## 2017-09-12 NOTE — Progress Notes (Signed)
[facility] - [place] at [facility]   PATIENT NAME: Chad Huffman    MR#:  [id number]  DATE OF BIRTH:  04/18/1946  SUBJECTIVE:  CHIEF COMPLAINT:   Chief Complaint  Patient presents with  . Shortness of Breath   Continues to have shortness of breath.  On 3 L oxygen. Persistent lower extremity swelling Feels congested in his chest Afebrile Sitting in a chair  Uses oxygen as needed at home.  REVIEW OF SYSTEMS:    Review of Systems  Constitutional: Positive for diaphoresis and weight loss. Negative for chills and fever.  HENT: Negative for sore throat.   Eyes: Negative for blurred vision, double vision and pain.  Respiratory: Positive for cough, shortness of breath and wheezing. Negative for hemoptysis.   Cardiovascular: Negative for chest pain, palpitations, orthopnea and leg swelling.  Gastrointestinal: Negative for abdominal pain, constipation, diarrhea, heartburn, nausea and vomiting.  Genitourinary: Negative for dysuria and hematuria.  Musculoskeletal: Negative for back pain and joint pain.  Skin: Negative for rash.  Neurological: Negative for sensory change, speech change, focal weakness and headaches.  Endo/Heme/Allergies: Does not bruise/bleed easily.  Psychiatric/Behavioral: Negative for depression. The patient is not nervous/anxious.     DRUG ALLERGIES:   Allergies  Allergen Reactions  . Ampicillin Swelling and Other (See Comments)    Unable to obtain enough information to answer additional questions about this medication.  Has patient had a PCN reaction causing immediate rash, facial/tongue/throat swelling, SOB or lightheadedness with hypotension: Yes Has patient had a PCN reaction causing severe rash involving mucus membranes or skin necrosis: No Has patient had a PCN reaction that required hospitalization No Has patient had a PCN reaction occurring within the last 10 years: Yes If all of the above answers are "NO", then may pr  . Nortriptyline  Other (See Comments)    Sleep walking  . Brimonidine Other (See Comments)    Unknown reaction per MAR   . Cyclobenzaprine Other (See Comments)    Hallucinations  . Tizanidine Other (See Comments)    Hallucinations  . Gabapentin Rash    VITALS:  Blood pressure (!) 172/86, pulse 63, temperature 97.8 F (36.6 C), temperature source Oral, resp. rate 18, height 5' 8.5" (1.74 m), weight 106.6 kg (235 lb), SpO2 94 %.  PHYSICAL EXAMINATION:   Physical Exam  GENERAL:  72 y.o.-year-old patient lying in the bed with no acute distress.  EYES: Pupils equal, round, reactive to light and accommodation. No scleral icterus. Extraocular muscles intact.  HEENT: Head atraumatic, normocephalic. Oropharynx and nasopharynx clear.  NECK:  Supple, no jugular venous distention. No thyroid enlargement, no tenderness.  LUNGS: b/l wheezing CARDIOVASCULAR: S1, S2 normal. No murmurs, rubs, or gallops.  ABDOMEN: Soft, nontender, nondistended. Bowel sounds present. No organomegaly or mass.  EXTREMITIES: No cyanosis, clubbing. LE edema NEUROLOGIC: Cranial nerves II through XII are intact. No focal Motor or sensory deficits b/l.   PSYCHIATRIC: The patient is alert and oriented x 3.  SKIN: No obvious rash, lesion, or ulcer.   LABORATORY PANEL:   CBC Recent Labs  Lab 09/10/17 0444  WBC 3.3*  HGB 10.4*  HCT 32.0*  PLT 136*   ------------------------------------------------------------------------------------------------------------------ Chemistries  Recent Labs  Lab 09/10/17 0444 09/12/17 0358  NA 135 130*  K 5.0 4.7  CL 96* 86*  CO2 32 38*  GLUCOSE 148* 116*  BUN 17 37*  CREATININE 0.79 1.19  CALCIUM 8.5* 8.4*  AST 25  --   ALT 17  --  ALKPHOS 67  --   BILITOT 0.9  --    ------------------------------------------------------------------------------------------------------------------  Cardiac Enzymes Recent Labs  Lab 09/09/17 0949  TROPONINI <0.03    ------------------------------------------------------------------------------------------------------------------  RADIOLOGY:  No results found.   ASSESSMENT AND PLAN:   #Community-acquired pneumonia-right-sided with COPD exacerbation and acute on chronic resp failure - IV steroids, Antibiotics - Scheduled Nebulizers - Inhalers - Wean O2 as tolerated  #Essential hypertension Stopped norvasc due to edema. Started HCTZ  #Hypothyroidism continue Synthyroid  # History of dilated cardiomyopathy Recent ejection fraction in January 2018-60% Follow-up with cardiology as outpt  #Bilateral lower extremity edema Start PO lasix  All the records are reviewed and case discussed with Care Management/Social Workerr. Management plans discussed with the patient, family and they are in agreement.  CODE STATUS: FULL CODE  DVT Prophylaxis: SCDs  TOTAL TIME TAKING CARE OF THIS PATIENT: 35 minutes.   D/C in AM  Molinda BailiffSrikar R Madysen Faircloth M.D on 09/12/2017 at 12:33 PM  Between 7am to 6pm - Pager - [phone]  After 6pm go to [email or web address] - password [email or web address]  [facility] [place] [facility]  Office  [phone]  CC: Primary care physician; Dorothey BasemanBronstein, [name], MD  Note: This dictation was prepared with Dragon dictation along with smaller phrase technology. Any transcriptional errors that result from this process are unintentional.

## 2017-09-13 LAB — CBC
HEMATOCRIT: 34.4 % — AB (ref 40.0–52.0)
Hemoglobin: 11.2 g/dL — ABNORMAL LOW (ref 13.0–18.0)
MCH: 31 pg (ref 26.0–34.0)
MCHC: 32.7 g/dL (ref 32.0–36.0)
MCV: 95 fL (ref 80.0–100.0)
PLATELETS: 147 10*3/uL — AB (ref 150–440)
RBC: 3.63 MIL/uL — AB (ref 4.40–5.90)
RDW: 16.5 % — ABNORMAL HIGH (ref 11.5–14.5)
WBC: 6.6 10*3/uL (ref 3.8–10.6)

## 2017-09-13 MED ORDER — PREDNISONE 10 MG (21) PO TBPK: ORAL_TABLET | ORAL | 0 refills | Status: DC

## 2017-09-13 MED ORDER — LEVOFLOXACIN 500 MG PO TABS: 500.0000 mg | ORAL_TABLET | Freq: Every day | ORAL | 0 refills | Status: DC

## 2017-09-13 MED ORDER — HYDROCHLOROTHIAZIDE 25 MG PO TABS: 25.0000 mg | ORAL_TABLET | Freq: Every day | ORAL | 0 refills | Status: DC

## 2017-09-13 MED ORDER — FUROSEMIDE 20 MG PO TABS: 20.0000 mg | ORAL_TABLET | Freq: Every day | ORAL | 0 refills | Status: DC

## 2017-09-13 NOTE — Progress Notes (Signed)
Discharge instructions given and went over with patient at bedside. Prescriptions reviewed. All questions answered. Patient discharged home with friend via wheelchair by nursing staff. Bo McclintockBrewer,Yossef Gilkison S, RN

## 2017-09-13 NOTE — Progress Notes (Signed)
Advance care planning  Patient admitted for pneumonia, COPD exacerbation, acute on chronic hypoxic respiratory failure, lower extremity edema.  We discussed regarding patient's treatment conditions and treatment plan.  Patient is on 3 L oxygen at home that he uses as needed.  He tells me that his COPD has been progressively worsening over time.  He does not smoke at this time.  Follows with pulmonary regularly.  He understands that COPD is a condition that progressively worsens over time and he will need continuous oxygen leaving from the hospital and follow-up with pulmonary.  We discussed regarding patient's CODE STATUS.  He mentions that he has had this conversation before with his pulmonologist.  He asked appropriate questions.  And he has made the decision to be a DO NOT RESUSCITATE and DO NOT INTUBATE.  Orders entered.  Time spent 20 minutes.

## 2017-09-13 NOTE — Discharge Instructions (Signed)
Resume diet and activity as before ° °Oxygen 2 liters/min °

## 2017-09-14 ENCOUNTER — Encounter: Payer: Self-pay | Admitting: Podiatry

## 2017-09-14 ENCOUNTER — Ambulatory Visit (INDEPENDENT_AMBULATORY_CARE_PROVIDER_SITE_OTHER): Payer: Managed Care, Other (non HMO) | Admitting: Podiatry

## 2017-09-14 DIAGNOSIS — B351 Tinea unguium: Secondary | ICD-10-CM | POA: Diagnosis not present

## 2017-09-14 DIAGNOSIS — M79676 Pain in unspecified toe(s): Secondary | ICD-10-CM

## 2017-09-14 LAB — CULTURE, BLOOD (ROUTINE X 2)
CULTURE: NO GROWTH
CULTURE: NO GROWTH
Special Requests: ADEQUATE

## 2017-09-14 NOTE — Progress Notes (Signed)
He presents today painful elongated toenails having just gotten out of the hospital for pneumonia.  Objective: Vital signs are stable he is alert and oriented x3.  Pulses are palpable.  Toenails are long thick yellow dystrophic click mycotic and painful palpation as well as debridement.  Assessment: Pain in limb secondary to onychomycosis.  Plan: Debridement of toenails 1 through 5 bilateral.

## 2017-09-15 ENCOUNTER — Ambulatory Visit: Payer: Medicare Other | Admitting: Pulmonary Disease

## 2017-09-16 ENCOUNTER — Encounter: Payer: Self-pay | Admitting: Pulmonary Disease

## 2017-09-16 ENCOUNTER — Ambulatory Visit: Payer: Medicare Other | Admitting: Family

## 2017-09-16 ENCOUNTER — Ambulatory Visit (INDEPENDENT_AMBULATORY_CARE_PROVIDER_SITE_OTHER): Payer: Medicare Other | Admitting: Pulmonary Disease

## 2017-09-16 ENCOUNTER — Telehealth: Payer: Self-pay | Admitting: Family

## 2017-09-16 VITALS — BP 128/78 | HR 83 | Ht 68.5 in | Wt 229.0 lb

## 2017-09-16 DIAGNOSIS — R600 Localized edema: Secondary | ICD-10-CM

## 2017-09-16 DIAGNOSIS — J9811 Atelectasis: Secondary | ICD-10-CM | POA: Diagnosis not present

## 2017-09-16 DIAGNOSIS — R062 Wheezing: Secondary | ICD-10-CM

## 2017-09-16 DIAGNOSIS — J449 Chronic obstructive pulmonary disease, unspecified: Secondary | ICD-10-CM

## 2017-09-16 DIAGNOSIS — J9612 Chronic respiratory failure with hypercapnia: Secondary | ICD-10-CM

## 2017-09-16 DIAGNOSIS — J986 Disorders of diaphragm: Secondary | ICD-10-CM | POA: Diagnosis not present

## 2017-09-16 NOTE — Discharge Summary (Signed)
[facility] - [place] at [facility]   PATIENT NAME: Chad Huffman    MR#:  [id number]  DATE OF BIRTH:  08/13/1945  DATE OF ADMISSION:  09/09/2017 ADMITTING PHYSICIAN: Ramonita Lab, MD  DATE OF DISCHARGE: 09/13/2017 11:53 AM  PRIMARY CARE PHYSICIAN: Dorothey Baseman, MD   ADMISSION DIAGNOSIS:  Cellulitis [L03.90] Peripheral edema [R60.9] Acute respiratory failure with hypoxia and hypercapnia (HCC) [J96.01, J96.02] Sepsis, due to unspecified organism (HCC) [A41.9] Multifocal pneumonia [J18.9]  DISCHARGE DIAGNOSIS:  Active Problems:   PNA (pneumonia)   SECONDARY DIAGNOSIS:   Past Medical History:  Diagnosis Date  . Antalgic gait   . Carotid artery occlusion   . Chronic neck and back pain   . Complication of anesthesia    pt has had cervical fusion-limited neck flextion  . COPD (chronic obstructive pulmonary disease) (HCC)   . Depression   . DJD (degenerative joint disease)    Right shoulder  . Hypertension   . Lumbago   . Peripheral neuropathy      ADMITTING HISTORY  HISTORY OF PRESENT ILLNESS:  Chad Huffman  is a 72 y.o. male with a known history of COPD sees Dr. Darrol Angel as an outpatient, chronically lives on 3 L of oxygen via nasal cannula during nighttime and as needed basis, essential hypertension and dilated cardiomyopathy and other medical problems is presenting to the ED with a chief complaint of shortness of breath and found to have hypoxia.  CT angiogram of the chest has revealed right-sided multilobar pneumonia And pulmonary hypertension.  Patient reports a severe tightness in his chest and working hard to breathe.  Patient was given IV salmeterol and magnesium sulfate in the ED.  HOSPITAL COURSE:   #Community-acquired pneumonia-right-sided with COPD exacerbation and acute on chronic resp failure - IV steroids, Antibiotics in the hospital.  Change to prednisone taper and Levaquin at discharge. - Scheduled Nebulizers - Inhalers  By day of  discharge patient feels improved with his breathing close to normal.  He does have chronic exertional shortness of breath.  #Essential hypertension Stopped norvasc due to edema. Started HCTZ  #Hypothyroidism continue Synthyroid  # History of dilated cardiomyopathy Recent ejection fraction in January 2018-60% Follow-up with cardiology as outpt  #Bilateral lower extremity edema Started PO lasix  Patient is stable for discharge home.  Has follow-up with [place] pulmonologist in 2-3 days.  CONSULTS OBTAINED:    DRUG ALLERGIES:   Allergies  Allergen Reactions  . Ampicillin Swelling and Other (See Comments)    Unable to obtain enough information to answer additional questions about this medication.  Has patient had a PCN reaction causing immediate rash, facial/tongue/throat swelling, SOB or lightheadedness with hypotension: Yes Has patient had a PCN reaction causing severe rash involving mucus membranes or skin necrosis: No Has patient had a PCN reaction that required hospitalization No Has patient had a PCN reaction occurring within the last 10 years: Yes If all of the above answers are "NO", then may pr  . Nortriptyline Other (See Comments)    Sleep walking  . Brimonidine Other (See Comments)    Unknown reaction per MAR   . Cyclobenzaprine Other (See Comments)    Hallucinations  . Tizanidine Other (See Comments)    Hallucinations  . Gabapentin Rash    DISCHARGE MEDICATIONS:   Allergies as of 09/13/2017      Reactions   Ampicillin Swelling, Other (See Comments)   Unable to obtain enough information to answer additional questions about this medication.  Has patient  had a PCN reaction causing immediate rash, facial/tongue/throat swelling, SOB or lightheadedness with hypotension: Yes Has patient had a PCN reaction causing severe rash involving mucus membranes or skin necrosis: No Has patient had a PCN reaction that required hospitalization No Has patient had a PCN reaction  occurring within the last 10 years: Yes If all of the above answers are "NO", then may pr   Nortriptyline Other (See Comments)   Sleep walking   Brimonidine Other (See Comments)   Unknown reaction per [facility]    Cyclobenzaprine Other (See Comments)   Hallucinations   Tizanidine Other (See Comments)   Hallucinations   Gabapentin Rash      Medication List    STOP taking these medications   amLODipine 5 MG tablet Commonly known as:  NORVASC     TAKE these medications   aspirin EC 81 MG tablet Take 81 mg by mouth daily.   baclofen 10 MG tablet Commonly known as:  LIORESAL Take 10 mg by mouth 3 (three) times daily.   budesonide 0.5 MG/2ML nebulizer solution Commonly known as:  PULMICORT Take 2 mLs (0.5 mg total) by nebulization 2 (two) times daily.   clonazePAM 0.5 MG tablet Commonly known as:  KLONOPIN Take 0.5 tablets (0.25 mg total) by mouth 2 (two) times daily for 5 days.   clonazePAM 0.5 MG tablet Commonly known as:  KLONOPIN Take 0.5 tablets (0.25 mg total) by mouth daily for 7 days.   clopidogrel 75 MG tablet Commonly known as:  PLAVIX Take 75 mg by mouth daily.   desonide 0.05 % cream Commonly known as:  DESOWEN Apply 1 application topically 2 (two) times daily.   folic acid 1 MG tablet Commonly known as:  FOLVITE Take 1 mg by mouth daily.   furosemide 20 MG tablet Commonly known as:  LASIX Take 1 tablet (20 mg total) by mouth daily.   hydrochlorothiazide 25 MG tablet Commonly known as:  HYDRODIURIL Take 1 tablet (25 mg total) by mouth daily.   HYDROcodone-acetaminophen 5-325 MG tablet Commonly known as:  NORCO/VICODIN Take 1 tablet by mouth every 4 (four) hours as needed.   ipratropium-albuterol 0.5-2.5 (3) MG/3ML Soln Commonly known as:  DUONEB Take 3 mLs by nebulization every 6 (six) hours as needed. Dx:J44.9   ketoconazole 2 % shampoo Commonly known as:  NIZORAL Apply 1 application topically daily as needed.   Krill Oil Omega-3 500 MG  Caps Take 1 tablet by mouth daily.   levofloxacin 500 MG tablet Commonly known as:  LEVAQUIN Take 1 tablet (500 mg total) by mouth daily.   levothyroxine 75 MCG tablet Commonly known as:  SYNTHROID, LEVOTHROID Take 1 tablet (75 mcg total) by mouth daily before breakfast. What changed:  how much to take   metoprolol tartrate 25 MG tablet Commonly known as:  LOPRESSOR Take 1 tablet (25 mg total) by mouth 2 (two) times daily.   multivitamin with minerals Tabs tablet Take 1 tablet by mouth daily.   OXYGEN 4 L/min by Intratracheal route as needed (for shortness of breath).   predniSONE 10 MG (21) Tbpk tablet Commonly known as:  STERAPRED UNI-PAK 21 TAB 6 tabs day 1 and taper 10 mg a day - 6 days   sertraline 100 MG tablet Commonly known as:  ZOLOFT Take 1 tablet (100 mg total) by mouth daily.   SYMBICORT 160-4.5 MCG/ACT inhaler Generic drug:  budesonide-formoterol TAKE 2 PUFFS BY MOUTH TWICE A DAY       Today   VITAL  SIGNS:  Blood pressure (!) 166/74, pulse 63, temperature (!) 97.5 F (36.4 C), temperature source Oral, resp. rate 18, height 5' 8.5" (1.74 m), weight 106.6 kg (235 lb), SpO2 92 %.  I/O:  No intake or output data in the 24 hours ending 09/16/17 1649  PHYSICAL EXAMINATION:  Physical Exam  GENERAL:  72 y.o.-year-old patient lying in the bed with no acute distress.  LUNGS: Normal breath sounds bilaterally, no wheezing, rales,rhonchi or crepitation. No use of accessory muscles of respiration.  CARDIOVASCULAR: S1, S2 normal. No murmurs, rubs, or gallops.  ABDOMEN: Soft, non-tender, non-distended. Bowel sounds present. No organomegaly or mass.  NEUROLOGIC: Moves all 4 extremities. PSYCHIATRIC: The patient is alert and oriented x 3.  SKIN: No obvious rash, lesion, or ulcer.   DATA REVIEW:   CBC Recent Labs  Lab 09/13/17 0505  WBC 6.6  HGB 11.2*  HCT 34.4*  PLT 147*    Chemistries  Recent Labs  Lab 09/10/17 0444 09/12/17 0358  NA 135 130*  K  5.0 4.7  CL 96* 86*  CO2 32 38*  GLUCOSE 148* 116*  BUN 17 37*  CREATININE 0.79 1.19  CALCIUM 8.5* 8.4*  AST 25  --   ALT 17  --   ALKPHOS 67  --   BILITOT 0.9  --     Cardiac Enzymes No results for input(s): TROPONINI in the last 168 hours.  Microbiology Results  Results for orders placed or performed during the hospital encounter of 09/09/17  Blood Culture (routine x 2)     Status: None   Collection Time: 09/09/17  9:45 AM  Result Value Ref Range Status   Specimen Description BLOOD BLOOD RIGHT FOREARM  Final   Special Requests   Final    BOTTLES DRAWN AEROBIC AND ANAEROBIC Blood Culture results may not be optimal due to an excessive volume of blood received in culture bottles   Culture   Final    NO GROWTH 5 DAYS Performed at [facility], [address] [place], [place] [address]    Report Status 09/14/2017 FINAL  Final  Blood Culture (routine x 2)     Status: None   Collection Time: 09/09/17 10:10 AM  Result Value Ref Range Status   Specimen Description BLOOD RIGHT ANTECUBITAL  Final   Special Requests   Final    BOTTLES DRAWN AEROBIC AND ANAEROBIC Blood Culture adequate volume   Culture   Final    NO GROWTH 5 DAYS Performed at [facility], [address] [place], [place] [address]    Report Status 09/14/2017 FINAL  Final  Urine culture     Status: Abnormal   Collection Time: 09/09/17 10:32 AM  Result Value Ref Range Status   Specimen Description   Final    URINE, RANDOM Performed at [facility], [address] [place], [place] [address]    Special Requests   Final    NONE Performed at [facility], [address] [place], [place] [address]    Culture MULTIPLE SPECIES PRESENT, SUGGEST RECOLLECTION (A)  Final   Report Status 09/10/2017 FINAL  Final    RADIOLOGY:  No results found.  Follow up with PCP in 1 week.  Management plans discussed with the patient, family and they are in agreement.  CODE STATUS:   Code Status History    Date Active Date Inactive Code Status Order ID Comments User Context   09/13/2017 0657 09/13/2017 1547 DNR [id number]  Milagros LollSudini, Rilee Knoll, MD  Inpatient   09/09/2017 1333 09/13/2017 0657 Full Code [id number]  Ramonita Lab, MD ED   06/24/2017 2105 06/27/2017 1732 Full Code [id number]  Houston Siren, MD Inpatient   09/26/2016 0852 09/28/2016 2146 Full Code [id number]  Shaune Pollack, MD Inpatient   09/13/2016 2229 09/16/2016 1551 Full Code [id number]  Oralia Manis, MD ED   09/09/2016 0911 09/10/2016 1632 Full Code [id number]  Nada Libman, MD Inpatient   08/20/2016 0206 08/23/2016 2138 Full Code [id number]  Hugelmeyer, Alexis, DO Inpatient   07/16/2016 1312 07/27/2016 2018 Full Code [id number]  Erin Fulling, MD ED   03/09/2016 1720 03/15/2016 2206 Full Code [id number]  Heide Spark Inpatient   03/03/2016 1809 03/09/2016 1604 Full Code [id number]  Erin Fulling, MD ED   02/27/2016 2241 02/29/2016 1953 Full Code [id number]  Oralia Manis, MD Inpatient   07/22/2015 1930 07/26/2015 0016 Full Code [id number]  Adrian Saran, MD Inpatient   02/11/2015 1028 02/11/2015 1815 Full Code [id number]  Nada Libman, MD Inpatient   08/04/2014 2013 08/13/2014 1453 Full Code [id number]  Lynden Oxford, MD Inpatient    Questions for Most Recent Historical Code Status (Order [id number])    Question Answer Comment   In the event of cardiac or respiratory ARREST Do not call a "code blue"    In the event of cardiac or respiratory ARREST Do not perform Intubation, CPR, defibrillation or ACLS    In the event of cardiac or respiratory ARREST Use medication by any route, position, wound care, and other measures to relive pain and suffering. May use oxygen, suction and manual treatment of airway obstruction as needed for comfort.         Advance Directive Documentation     Most Recent Value  Type of Advance Directive  Healthcare Power of Attorney, Living will  Pre-existing out of facility DNR order (yellow form or pink MOST form)  -  "MOST" Form  in Place?  -      TOTAL TIME TAKING CARE OF THIS PATIENT ON DAY OF DISCHARGE: more than 30 minutes.   Molinda Bailiff Oday Ridings M.D on 09/16/2017 at 4:49 PM  Between 7am to 6pm - Pager - [phone]  After 6pm go to [email or web address] - password [email or web address]  [facility] [place] [facility]  Office  [phone]  CC: Primary care physician; Dorothey Baseman, MD  Note: This dictation was prepared with Dragon dictation along with smaller phrase technology. Any transcriptional errors that result from this process are unintentional.

## 2017-09-16 NOTE — Patient Instructions (Signed)
Complete prednisone taper and levofloxacin as prescribed at the time of discharge from the hospital Continue Symbicort inhaler Continue DuoNeb as needed -I recommended more liberal use of this medication Recommend compression stockings for lower extremity swelling Recommend further discussion with Dr. Terance HartBronstein regarding lower extremity swelling Follow-up in 3-4 months

## 2017-09-16 NOTE — Telephone Encounter (Signed)
Patient was seen in our office this morning Noticed appt with Chad Huffman on AVS and needed to cancel for today They will call back to get it rescheduled

## 2017-09-16 NOTE — Progress Notes (Signed)
PULMONARY OFFICE FOLLOW UP NOTE   PROBLEMS:  Former smoker Very severe COPD Difficult intubation Status post cervical spine fusion Chronically elevated right hemidiaphragm Prolonged hospitalization September 2017 to January 2018  Underwent emergency tracheostomy tube placement 03/03/2016   Dr Willeen Cass ENT performed  Discharged to [facility] 03/08/2016  G tube removed 07/02/16  Transferred to [name] 04/2017  Decannulated in early Jan 2018  Discharged to home 07/05/16 Re-admitted 07/16/16 with acute respiratory failure due to COPD with hypercarbic resp failure  Extubated 07/21/16    DATA: CT chest 03/23/16: Right hemidiaphragm is elevated. No pleural fluid on the left. Minimal hazy infiltrate present in the apex of the left upper lobe. The right chest shows a small effusion largely layering dependently. There is extensive airspace filling in the upper lobe consistent with bronchopneumonia. There is patchy density in the right lower lobe in the right middle lobe that could be atelectasis or lesser bronchopneumonia. The appearance is improved since the CT of 03/03/16 Echocardiogram 07/17/16: grade I diastolic dysfunction. LVEF 60%. LA mildly dilated. RV mildly dilated CT head and neck 09/26/16: No acute intracranial abnormality identified. Small scalp contusion in the superior frontal region. No displaced calvarial fracture. 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 No acute fracture of the cervical spine. Stable postsurgical changes related to occipital to T2 fusion, C1 the C6 posterior decompression. No apparent hardware related complication. Stable advanced cervical spondylosis greatest at the C3 through C5 levels. Hospitalized 04/08-04/10/18 for hypercapnic respiratory failure Hospitalized 03/22-03/25/19 worsening LE edema and dyspnea. Given dx of PNA based on CT chest findings but his presentation was not  consistent with PNA CT chest 09/09/17: Elevated right hemidiaphragm with RLL atelectasis  INTERVAL: Hospitalized 03/22-03/25/19 for increasing lower extremity edema and mild worsening of exertional dyspnea.  SUBJ: Prior to his recent hospitalization, he was started on a new antihypertensive by Dr. Terance Hart.  He developed severe worsening lower extremity edema.  This is the reason that he presented to the emergency department.  He also noted mildly worsening dyspnea at that time.  A CT scan of the chest was performed with the findings as documented above.  The official report suggested right lower lobe pneumonia.  Therefore he was hospitalized with this diagnosis.  He was treated with diuresis, systemic steroids, antibiotics and the usual treatment for COPD exacerbation.  He was discharged 09/12/17 in improved condition.  He returns today for routine reevaluation.  He continues to have chronic exertional dyspnea, now at his baseline.  He continues to have problems with lower extremity edema.  He denies fever, cough, purulent sputum, hemoptysis.   OBJ: Vitals:   09/16/17 0905 09/16/17 0912  BP:  128/78  Pulse:  83  SpO2:  92%  Weight: 229 lb (103.9 kg)   Height: 5' 8.5" (1.74 m)   Room air  No overt distress HEENT: NCAT, sclerae white Neck: Extremely limited neck mobility with well-healed posterior surgical scar Chest: Breath sounds are mildly diminished throughout with few scattered wheezes Cardiac: Regular, no murmurs Abdomen: Soft, NT, + BS. Extremities: 3-4+ symmetric lower extremity edema with chronic stasis changes   DATA: BMP Latest Ref Rng & Units 09/12/2017 09/10/2017 09/09/2017  Glucose 65 - 99 mg/dL 161(W) 960(A) 98  BUN 6 - 20 mg/dL 54(U) 17 19  Creatinine 0.61 - 1.24 mg/dL 9.81 1.91 4.78  Sodium 135 - 145 mmol/L 130(L) 135 136  Potassium 3.5 - 5.1 mmol/L 4.7 5.0 4.6  Chloride 101 -  111 mmol/L 86(L) 96(L) 97(L)  CO2 22 - 32 mmol/L 38(H) 32 32  Calcium 8.9 - 10.3 mg/dL  1.6(X8.4(L) 0.9(U8.5(L) 0.4(V8.7(L)   CBC Latest Ref Rng & Units 09/13/2017 09/10/2017 09/09/2017  WBC 3.8 - 10.6 K/uL 6.6 3.3(L) 5.7  Hemoglobin 13.0 - 18.0 g/dL 11.2(L) 10.4(L) 10.4(L)  Hematocrit 40.0 - 52.0 % 34.4(L) 32.0(L) 32.3(L)  Platelets 150 - 440 K/uL 147(L) 136(L) 132(L)   CXR 09/09/17: Chronically elevated right hemidiaphragm.  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  Doubt that he suffered recent pneumonia  COPD, very severe (HCC)  Severe lower extremity edema  Mild wheezing noted on today's exam   PLAN/REC: Complete prednisone taper and levofloxacin as prescribed at the time of discharge from the hospital Continue Symbicort inhaler Continue DuoNeb as needed - I recommended more liberal use of this medication Recommended compression stockings for lower extremity swelling Recommended further discussion with Dr. Terance HartBronstein regarding lower extremity swelling Follow-up in 3-4 months  Billy Fischeravid Petro Talent, MD PCCM service Mobile [phone] Pager [phone] 09/16/2017 9:55 AM

## 2017-09-20 ENCOUNTER — Other Ambulatory Visit: Payer: Self-pay | Admitting: Surgery

## 2017-09-26 ENCOUNTER — Inpatient Hospital Stay (HOSPITAL_COMMUNITY): Admission: RE | Admit: 2017-09-26 | Payer: Medicare Other | Source: Ambulatory Visit

## 2017-09-26 ENCOUNTER — Encounter (HOSPITAL_COMMUNITY): Payer: Medicare Other

## 2017-09-26 ENCOUNTER — Ambulatory Visit: Payer: Medicare Other | Admitting: Family

## 2017-09-26 NOTE — Progress Notes (Signed)
Patient ID: Chad Huffman, male    DOB: 1946-04-22, 72 y.o.   MRN: [id number]  HPI  Chad Huffman is a 72 y/o male with a history of HTN, DJD, COPD with oxygen, cervical fusion, previous tobacco use and chronic heart failure.   Last echo was done 07/17/16 and showed an EF of 60% along with trivial AR and mild Chad. Normal pulmonary artery pressures. This is comparable to an echo done 08/07/14.  Admitted 09/09/17 due to pneumonia and COPD exacerbation. Initially given IV antibiotics and steroids and then transitioned to oral medications. Discharged after 4 days. Admitted 06/24/17 due to hyponatremia. Given IV fluids and salt tablets. Psychiatry consulted due to anxiety and medications were adjusted. Discharged after 3 days.   Chad Huffman presents today for a follow-up visit with a chief complaint of lower leg edema. Chad Huffman says this has been present for a few months but is getting better. Chad Huffman has associated neck pain/stiffness and light-headedness along with this. Chad Huffman denies any difficulty sleeping, abdominal distention, palpitations, chest pain, shortness of breath, cough, fatigue or weight gain.   Past Medical History:  Diagnosis Date  . Antalgic gait   . Carotid artery occlusion   . Chronic neck and back pain   . Complication of anesthesia    pt has had cervical fusion-limited neck flextion  . COPD (chronic obstructive pulmonary disease) (HCC)   . Depression   . DJD (degenerative joint disease)    Right shoulder  . Hypertension   . Lumbago   . Peripheral neuropathy    Past Surgical History:  Procedure Laterality Date  . ABDOMINAL AORTIC ANEURYSM REPAIR  2009   Endovascular AAA repair  . ABDOMINAL AORTIC ANEURYSM REPAIR    . CAROTID ANGIOGRAPHY  09/09/2016   Procedure: Carotid Angiography;  Surgeon: Nada Libman, MD;  Location: [facility] INVASIVE CV LAB;  Service: Cardiovascular;;  rt. carotid  . CARPAL TUNNEL RELEASE     bilateral CTS  . ESOPHAGOGASTRODUODENOSCOPY (EGD) WITH PROPOFOL N/A 09/28/2016   Procedure: ESOPHAGOGASTRODUODENOSCOPY (EGD) WITH PROPOFOL;  Surgeon: Midge Minium, MD;  Location: [facility] ENDOSCOPY;  Service: Endoscopy;  Laterality: N/A;  . HAMMER TOE SURGERY  12/10/2011   Procedure: HAMMER TOE CORRECTION;  Surgeon: Ernestene Kiel, DPM;  Location: [facility];  Service: Podiatry;  Laterality: Right;  Right hammertoe repair second right with 2.5x42 orthopro screw   . IR GENERIC HISTORICAL  04/05/2016   IR GASTROSTOMY TUBE MOD SED 04/05/2016 [facility]  . IR GENERIC HISTORICAL  05/12/2016   IR CM INJ ANY COLONIC TUBE W/FLUORO 05/12/2016 Malachy Moan, MD [facility]  . IR GENERIC HISTORICAL  05/26/2016   IR CM INJ ANY COLONIC TUBE W/FLUORO 05/26/2016 [facility]  . IR GENERIC HISTORICAL  05/28/2016   IR REPLC GASTRO/COLONIC TUBE PERCUT W/FLUORO 05/28/2016 Brayton El, PA-C [facility]  . IR GENERIC HISTORICAL  07/02/2016   IR GASTROSTOMY TUBE REMOVAL 07/02/2016 Brayton El, PA-C [facility]  . METATARSAL OSTEOTOMY  12/10/2011   Procedure: METATARSAL OSTEOTOMY;  Surgeon: Ernestene Kiel, DPM;  Location: [facility];  Service: Podiatry;  Laterality: Right;  Right Keller arthroplasty with size 5 silicone implant right great toe; second metatarsal osteotomy with 2.0x14 screw  . NECK SURGERY    . PERIPHERAL VASCULAR CATHETERIZATION N/A 02/11/2015   Procedure: Carotid Angiography;  Surgeon: Nada Libman, MD;  Location: [facility] INVASIVE CV LAB;  Service: Cardiovascular;  Laterality: N/A;  . PILONIDAL CYST / SINUS EXCISION    . Rotary cuff    .  SPINE SURGERY  09/15/11   Scar tissue removed- back  . SPINE SURGERY  09/15/11   Scar tissue removed- back  . TONSILLECTOMY    . TRACHEOSTOMY TUBE PLACEMENT N/A 03/03/2016   Procedure: TRACHEOSTOMY;  Surgeon: Geanie Logan, MD;  Location: [facility] ORS;  Service: ENT;  Laterality: N/A;   Family History  Problem Relation Age of Onset  . Stomach cancer Mother   . Hypertension Brother   . Heart attack Father    Social  History   Tobacco Use  . Smoking status: Former Smoker    Packs/day: 1.00    Years: 30.00    Pack years: 30.00    Types: Cigarettes    Last attempt to quit: 08/30/2006    Years since quitting: 11.0  . Smokeless tobacco: Never Used  Substance Use Topics  . Alcohol use: Yes    Alcohol/week: 1.2 oz    Types: 2 Cans of beer per week    Comment: occasional use   Allergies  Allergen Reactions  . Ampicillin Swelling and Other (See Comments)    Unable to obtain enough information to answer additional questions about this medication.  Has patient had a PCN reaction causing immediate rash, facial/tongue/throat swelling, SOB or lightheadedness with hypotension: Yes Has patient had a PCN reaction causing severe rash involving mucus membranes or skin necrosis: No Has patient had a PCN reaction that required hospitalization No Has patient had a PCN reaction occurring within the last 10 years: Yes If all of the above answers are "NO", then may pr  . Nortriptyline Other (See Comments)    Sleep walking  . Brimonidine Other (See Comments)    Unknown reaction per MAR   . Cyclobenzaprine Other (See Comments)    Hallucinations  . Tizanidine Other (See Comments)    Hallucinations  . Gabapentin Rash   Prior to Admission medications   Medication Sig Start Date End Date Taking? Authorizing Provider  aspirin EC 81 MG tablet Take 81 mg by mouth daily.   Yes [provider]  baclofen (LIORESAL) 10 MG tablet Take 10 mg by mouth 3 (three) times daily.   Yes [provider]  budesonide (PULMICORT) 0.5 MG/2ML nebulizer solution Take 2 mLs (0.5 mg total) by nebulization 2 (two) times daily. 12/21/16  Yes Merwyn Katos, MD  clopidogrel (PLAVIX) 75 MG tablet TAKE 1 TABLET BY MOUTH EVERY DAY 09/20/17  Yes Chuck Hint, MD  desonide (DESOWEN) 0.05 % cream Apply 1 application topically 2 (two) times daily.   Yes [provider]  folic acid (FOLVITE) 1 MG tablet Take 1 mg by  mouth daily.    Yes [provider]  furosemide (LASIX) 20 MG tablet Take 1 tablet (20 mg total) by mouth daily. 09/13/17 10/13/17 Yes Sudini, Wardell Heath, MD  hydrochlorothiazide (HYDRODIURIL) 25 MG tablet Take 1 tablet (25 mg total) by mouth daily. 09/13/17  Yes Sudini, Wardell Heath, MD  HYDROcodone-acetaminophen (NORCO/VICODIN) 5-325 MG tablet Take 1 tablet by mouth every 4 (four) hours as needed. 06/27/17  Yes Shaune Pollack, MD  ipratropium-albuterol (DUONEB) 0.5-2.5 (3) MG/3ML SOLN Take 3 mLs by nebulization every 6 (six) hours as needed. Dx:J44.9 12/21/16  Yes Merwyn Katos, MD  ketoconazole (NIZORAL) 2 % shampoo Apply 1 application topically daily as needed.   Yes [provider]  [facility] Lanius Omega-3 500 MG CAPS Take 1 tablet by mouth daily.   Yes [provider]  levothyroxine (SYNTHROID, LEVOTHROID) 200 MCG tablet Take 200 mcg by mouth daily before  breakfast.   Yes [provider]  metoprolol tartrate (LOPRESSOR) 25 MG tablet Take 1 tablet (25 mg total) by mouth 2 (two) times daily. 07/27/16  Yes Katharina CaperVaickute, Rima, MD  Multiple Vitamin (MULTIVITAMIN WITH MINERALS) TABS tablet Take 1 tablet by mouth daily.   Yes [provider]  OXYGEN 4 L/min by Intratracheal route as needed (for shortness of breath).    Yes [provider]  sertraline (ZOLOFT) 100 MG tablet Take 1 tablet (100 mg total) by mouth daily. 06/27/17  Yes Shaune Pollackhen, Qing, MD  [facility] LPYMBICORT 160-4.5 MCG/ACT inhaler TAKE 2 PUFFS BY MOUTH TWICE A DAY 06/01/17  Yes Merwyn KatosSimonds, [name] B, MD   Review of Systems  Constitutional: Negative for appetite change and fatigue.  HENT: Negative for congestion, postnasal drip and sore throat.   Eyes: Negative.   Respiratory: Negative for cough, chest tightness, shortness of breath and wheezing.   Cardiovascular: Positive for leg swelling (better). Negative for chest pain and palpitations.  Gastrointestinal: Negative for abdominal distention and abdominal pain.  Endocrine:  Negative.   Genitourinary: Negative.   Musculoskeletal: Positive for neck pain and neck stiffness.  Skin: Negative.   Allergic/Immunologic: Negative.   Neurological: Positive for light-headedness. Negative for dizziness and syncope.  Hematological: Negative for adenopathy. Bruises/bleeds easily.  Psychiatric/Behavioral: Negative for dysphoric mood, sleep disturbance (sleeping on 1 pillow with oxygen at 4L) and suicidal ideas. The patient is not nervous/anxious.    Vitals:   09/28/17 1004  BP: 116/67  Pulse: 74  Resp: 18  SpO2: 91%  Weight: 228 lb (103.4 kg)  Height: 5\' 8"  (1.727 m)   Wt Readings from Last 3 Encounters:  09/28/17 228 lb (103.4 kg)  09/16/17 229 lb (103.9 kg)  09/09/17 235 lb (106.6 kg)   Lab Results  Component Value Date   CREATININE 1.19 09/12/2017   CREATININE 0.79 09/10/2017   CREATININE 1.00 09/09/2017    Physical Exam  Constitutional: Chad Huffman is oriented to person, place, and time. Chad Huffman appears well-developed and well-nourished.  HENT:  Head: Normocephalic and atraumatic.  Neck: No JVD present. Decreased range of motion present.  Cardiovascular: Normal rate and regular rhythm.  Pulmonary/Chest: Effort normal. Chad Huffman has no wheezes. Chad Huffman has no rales.  Abdominal: Soft. Chad Huffman exhibits no distension. There is no tenderness.  Musculoskeletal: Chad Huffman exhibits edema (2+ pitting edema bilateral lower legs).  Neurological: Chad Huffman is alert and oriented to person, place, and time.  Skin: Skin is warm and dry.  Psychiatric: Chad Huffman has a normal mood and affect. His behavior is normal. Thought content normal.  Nursing note and vitals reviewed.   Assessment & Plan:  1: Chronic heart failure with preserved ejection fraction- - NYHA class I - mildly fluid overloaded today with edema - weighing daily. Reminded to call for an overnight weight gain of >2 pounds or a weekly weight gain of >5 pounds.  - Weight stable  From 09/16/17 - not adding salt and is trying to read food labels - has  been taking furosemide for the last few weeks with improving edema - BNP on 06/24/17 was 228.0 - PharmD reconciled medications - encouraged him to elevate legs when sitting for long periods of time - encouraged him to get support socks and wear them daily with removal at bedtime  2: HTN- - BP looks good today - saw PCP 09/09/17 - BMP 09/12/17 reviewed and showed sodium 130, potassium 4.7 and GFR >60  3: COPD- - has oxygen at 4L that Chad Huffman wears around the clock when  at home.  - saw pulmonologist Sung Amabile) 09/16/17 & returns 12/16/17  Patient did not bring his medications nor a list. Each medication was verbally reviewed with the patient and Chad Huffman was encouraged to bring the bottles to every visit to confirm accuracy of list.  Return in 6 months or sooner for any questions/problems before then.

## 2017-09-28 ENCOUNTER — Encounter: Payer: Self-pay | Admitting: Family

## 2017-09-28 ENCOUNTER — Ambulatory Visit: Payer: Medicare Other | Attending: Family | Admitting: Family

## 2017-09-28 VITALS — BP 116/67 | HR 74 | Resp 18 | Ht 68.0 in | Wt 228.0 lb

## 2017-09-28 DIAGNOSIS — I509 Heart failure, unspecified: Secondary | ICD-10-CM | POA: Diagnosis not present

## 2017-09-28 DIAGNOSIS — Z9889 Other specified postprocedural states: Secondary | ICD-10-CM | POA: Diagnosis not present

## 2017-09-28 DIAGNOSIS — Z87891 Personal history of nicotine dependence: Secondary | ICD-10-CM | POA: Diagnosis not present

## 2017-09-28 DIAGNOSIS — Z7982 Long term (current) use of aspirin: Secondary | ICD-10-CM | POA: Diagnosis not present

## 2017-09-28 DIAGNOSIS — Z79899 Other long term (current) drug therapy: Secondary | ICD-10-CM | POA: Diagnosis not present

## 2017-09-28 DIAGNOSIS — Z880 Allergy status to penicillin: Secondary | ICD-10-CM | POA: Diagnosis not present

## 2017-09-28 DIAGNOSIS — I110 Hypertensive heart disease with heart failure: Secondary | ICD-10-CM | POA: Diagnosis present

## 2017-09-28 DIAGNOSIS — I10 Essential (primary) hypertension: Secondary | ICD-10-CM

## 2017-09-28 DIAGNOSIS — J410 Simple chronic bronchitis: Secondary | ICD-10-CM

## 2017-09-28 DIAGNOSIS — J449 Chronic obstructive pulmonary disease, unspecified: Secondary | ICD-10-CM | POA: Diagnosis not present

## 2017-09-28 DIAGNOSIS — Z888 Allergy status to other drugs, medicaments and biological substances status: Secondary | ICD-10-CM | POA: Insufficient documentation

## 2017-09-28 DIAGNOSIS — I5032 Chronic diastolic (congestive) heart failure: Secondary | ICD-10-CM

## 2017-09-28 NOTE — Patient Instructions (Signed)
Continue weighing daily and call for an overnight weight gain of > 2 pounds or a weekly weight gain of >5 pounds. 

## 2017-10-04 ENCOUNTER — Encounter: Payer: Self-pay | Admitting: *Deleted

## 2017-10-04 ENCOUNTER — Emergency Department: Payer: Medicare Other

## 2017-10-04 ENCOUNTER — Inpatient Hospital Stay
Admission: EM | Admit: 2017-10-04 | Discharge: 2017-10-10 | DRG: 193 | Disposition: A | Discharge status: Home Health | Payer: Medicare Other | Attending: Internal Medicine | Admitting: Internal Medicine

## 2017-10-04 ENCOUNTER — Other Ambulatory Visit: Payer: Self-pay

## 2017-10-04 DIAGNOSIS — L03116 Cellulitis of left lower limb: Secondary | ICD-10-CM | POA: Diagnosis present

## 2017-10-04 DIAGNOSIS — I110 Hypertensive heart disease with heart failure: Secondary | ICD-10-CM | POA: Diagnosis present

## 2017-10-04 DIAGNOSIS — R0689 Other abnormalities of breathing: Secondary | ICD-10-CM

## 2017-10-04 DIAGNOSIS — G8929 Other chronic pain: Secondary | ICD-10-CM | POA: Diagnosis present

## 2017-10-04 DIAGNOSIS — Z79899 Other long term (current) drug therapy: Secondary | ICD-10-CM

## 2017-10-04 DIAGNOSIS — S0101XA Laceration without foreign body of scalp, initial encounter: Secondary | ICD-10-CM | POA: Diagnosis present

## 2017-10-04 DIAGNOSIS — M542 Cervicalgia: Secondary | ICD-10-CM | POA: Diagnosis present

## 2017-10-04 DIAGNOSIS — D696 Thrombocytopenia, unspecified: Secondary | ICD-10-CM | POA: Diagnosis present

## 2017-10-04 DIAGNOSIS — L03115 Cellulitis of right lower limb: Secondary | ICD-10-CM | POA: Diagnosis present

## 2017-10-04 DIAGNOSIS — K5900 Constipation, unspecified: Secondary | ICD-10-CM | POA: Diagnosis present

## 2017-10-04 DIAGNOSIS — Z930 Tracheostomy status: Secondary | ICD-10-CM | POA: Diagnosis not present

## 2017-10-04 DIAGNOSIS — M1990 Unspecified osteoarthritis, unspecified site: Secondary | ICD-10-CM | POA: Diagnosis present

## 2017-10-04 DIAGNOSIS — J181 Lobar pneumonia, unspecified organism: Principal | ICD-10-CM | POA: Diagnosis present

## 2017-10-04 DIAGNOSIS — M19011 Primary osteoarthritis, right shoulder: Secondary | ICD-10-CM | POA: Diagnosis present

## 2017-10-04 DIAGNOSIS — I6529 Occlusion and stenosis of unspecified carotid artery: Secondary | ICD-10-CM | POA: Diagnosis present

## 2017-10-04 DIAGNOSIS — R2689 Other abnormalities of gait and mobility: Secondary | ICD-10-CM | POA: Diagnosis present

## 2017-10-04 DIAGNOSIS — J9811 Atelectasis: Secondary | ICD-10-CM | POA: Diagnosis present

## 2017-10-04 DIAGNOSIS — I951 Orthostatic hypotension: Secondary | ICD-10-CM | POA: Diagnosis present

## 2017-10-04 DIAGNOSIS — Z888 Allergy status to other drugs, medicaments and biological substances status: Secondary | ICD-10-CM

## 2017-10-04 DIAGNOSIS — I5032 Chronic diastolic (congestive) heart failure: Secondary | ICD-10-CM | POA: Diagnosis present

## 2017-10-04 DIAGNOSIS — E222 Syndrome of inappropriate secretion of antidiuretic hormone: Secondary | ICD-10-CM | POA: Diagnosis present

## 2017-10-04 DIAGNOSIS — D649 Anemia, unspecified: Secondary | ICD-10-CM | POA: Diagnosis present

## 2017-10-04 DIAGNOSIS — R609 Edema, unspecified: Secondary | ICD-10-CM

## 2017-10-04 DIAGNOSIS — J441 Chronic obstructive pulmonary disease with (acute) exacerbation: Secondary | ICD-10-CM | POA: Diagnosis present

## 2017-10-04 DIAGNOSIS — F329 Major depressive disorder, single episode, unspecified: Secondary | ICD-10-CM | POA: Diagnosis present

## 2017-10-04 DIAGNOSIS — J9622 Acute and chronic respiratory failure with hypercapnia: Secondary | ICD-10-CM | POA: Diagnosis present

## 2017-10-04 DIAGNOSIS — R0602 Shortness of breath: Secondary | ICD-10-CM

## 2017-10-04 DIAGNOSIS — J440 Chronic obstructive pulmonary disease with acute lower respiratory infection: Secondary | ICD-10-CM | POA: Diagnosis present

## 2017-10-04 DIAGNOSIS — Z7951 Long term (current) use of inhaled steroids: Secondary | ICD-10-CM

## 2017-10-04 DIAGNOSIS — Z981 Arthrodesis status: Secondary | ICD-10-CM

## 2017-10-04 DIAGNOSIS — G629 Polyneuropathy, unspecified: Secondary | ICD-10-CM | POA: Diagnosis present

## 2017-10-04 DIAGNOSIS — Z66 Do not resuscitate: Secondary | ICD-10-CM | POA: Diagnosis present

## 2017-10-04 DIAGNOSIS — J9690 Respiratory failure, unspecified, unspecified whether with hypoxia or hypercapnia: Secondary | ICD-10-CM | POA: Diagnosis present

## 2017-10-04 DIAGNOSIS — Z9981 Dependence on supplemental oxygen: Secondary | ICD-10-CM | POA: Diagnosis not present

## 2017-10-04 DIAGNOSIS — R402413 Glasgow coma scale score 13-15, at hospital admission: Secondary | ICD-10-CM | POA: Diagnosis present

## 2017-10-04 DIAGNOSIS — I440 Atrioventricular block, first degree: Secondary | ICD-10-CM | POA: Diagnosis present

## 2017-10-04 DIAGNOSIS — M545 Low back pain: Secondary | ICD-10-CM | POA: Diagnosis present

## 2017-10-04 DIAGNOSIS — W010XXA Fall on same level from slipping, tripping and stumbling without subsequent striking against object, initial encounter: Secondary | ICD-10-CM | POA: Diagnosis present

## 2017-10-04 DIAGNOSIS — Z7902 Long term (current) use of antithrombotics/antiplatelets: Secondary | ICD-10-CM

## 2017-10-04 DIAGNOSIS — Z880 Allergy status to penicillin: Secondary | ICD-10-CM

## 2017-10-04 DIAGNOSIS — E039 Hypothyroidism, unspecified: Secondary | ICD-10-CM | POA: Diagnosis present

## 2017-10-04 DIAGNOSIS — Z87891 Personal history of nicotine dependence: Secondary | ICD-10-CM

## 2017-10-04 DIAGNOSIS — K521 Toxic gastroenteritis and colitis: Secondary | ICD-10-CM | POA: Diagnosis present

## 2017-10-04 DIAGNOSIS — I509 Heart failure, unspecified: Secondary | ICD-10-CM

## 2017-10-04 DIAGNOSIS — T502X5A Adverse effect of carbonic-anhydrase inhibitors, benzothiadiazides and other diuretics, initial encounter: Secondary | ICD-10-CM | POA: Diagnosis present

## 2017-10-04 DIAGNOSIS — R296 Repeated falls: Secondary | ICD-10-CM | POA: Diagnosis present

## 2017-10-04 DIAGNOSIS — Z7982 Long term (current) use of aspirin: Secondary | ICD-10-CM

## 2017-10-04 DIAGNOSIS — Z8679 Personal history of other diseases of the circulatory system: Secondary | ICD-10-CM

## 2017-10-04 LAB — MRSA PCR SCREENING: MRSA BY PCR: NEGATIVE

## 2017-10-04 LAB — CBC WITH DIFFERENTIAL/PLATELET
BASOS ABS: 0 10*3/uL (ref 0–0.1)
Basophils Relative: 1 %
Eosinophils Absolute: 0.1 10*3/uL (ref 0–0.7)
Eosinophils Relative: 1 %
HEMATOCRIT: 32.3 % — AB (ref 40.0–52.0)
Hemoglobin: 11.1 g/dL — ABNORMAL LOW (ref 13.0–18.0)
LYMPHS ABS: 0.7 10*3/uL — AB (ref 1.0–3.6)
LYMPHS PCT: 11 %
MCH: 32.2 pg (ref 26.0–34.0)
MCHC: 34.3 g/dL (ref 32.0–36.0)
MCV: 93.7 fL (ref 80.0–100.0)
Monocytes Absolute: 0.7 10*3/uL (ref 0.2–1.0)
Monocytes Relative: 12 %
NEUTROS ABS: 4.4 10*3/uL (ref 1.4–6.5)
Neutrophils Relative %: 75 %
Platelets: 133 10*3/uL — ABNORMAL LOW (ref 150–440)
RBC: 3.45 MIL/uL — AB (ref 4.40–5.90)
RDW: 15.7 % — ABNORMAL HIGH (ref 11.5–14.5)
WBC: 5.9 10*3/uL (ref 3.8–10.6)

## 2017-10-04 LAB — CREATININE, SERUM
CREATININE: 0.89 mg/dL (ref 0.61–1.24)
GFR calc Af Amer: >60 mL/min (ref >60)
GFR calc non Af Amer: >60 mL/min (ref >60)

## 2017-10-04 LAB — COMPREHENSIVE METABOLIC PANEL
ALBUMIN: 3.7 g/dL (ref 3.5–5.0)
ALK PHOS: 60 U/L (ref 38–126)
ALT: 14 U/L — ABNORMAL LOW (ref 17–63)
AST: 21 U/L (ref 15–41)
Anion gap: 5 (ref 5–15)
BILIRUBIN TOTAL: 1.5 mg/dL — AB (ref 0.3–1.2)
BUN: 25 mg/dL — AB (ref 6–20)
CALCIUM: 8.7 mg/dL — AB (ref 8.9–10.3)
CO2: 39 mmol/L — ABNORMAL HIGH (ref 22–32)
CREATININE: 0.9 mg/dL (ref 0.61–1.24)
Chloride: 81 mmol/L — ABNORMAL LOW (ref 101–111)
GFR calc Af Amer: >60 mL/min (ref >60)
GFR calc non Af Amer: >60 mL/min (ref >60)
Glucose, Bld: 92 mg/dL (ref 65–99)
Potassium: 4.2 mmol/L (ref 3.5–5.1)
Sodium: 125 mmol/L — ABNORMAL LOW (ref 135–145)
TOTAL PROTEIN: 6.7 g/dL (ref 6.5–8.1)

## 2017-10-04 LAB — CBC
HCT: 33.1 % — ABNORMAL LOW (ref 40.0–52.0)
HEMOGLOBIN: 11.1 g/dL — AB (ref 13.0–18.0)
MCH: 31.4 pg (ref 26.0–34.0)
MCHC: 33.6 g/dL (ref 32.0–36.0)
MCV: 93.7 fL (ref 80.0–100.0)
Platelets: 125 10*3/uL — ABNORMAL LOW (ref 150–440)
RBC: 3.54 MIL/uL — AB (ref 4.40–5.90)
RDW: 15.8 % — ABNORMAL HIGH (ref 11.5–14.5)
WBC: 6.4 10*3/uL (ref 3.8–10.6)

## 2017-10-04 LAB — GLUCOSE, CAPILLARY: Glucose-Capillary: 80 mg/dL (ref 65–99)

## 2017-10-04 LAB — BRAIN NATRIURETIC PEPTIDE: B NATRIURETIC PEPTIDE 5: 221 pg/mL — AB (ref 0.0–100.0)

## 2017-10-04 LAB — TROPONIN I: Troponin I: <0.03 ng/mL (ref <0.03)

## 2017-10-04 LAB — LACTIC ACID, PLASMA: Lactic Acid, Venous: 0.5 mmol/L (ref 0.5–1.9)

## 2017-10-04 MED ORDER — ALBUTEROL SULFATE (2.5 MG/3ML) 0.083% IN NEBU: 5.0000 mg | INHALATION_SOLUTION | Freq: Once | RESPIRATORY_TRACT | Status: DC

## 2017-10-04 MED ORDER — ACETAMINOPHEN 325 MG PO TABS: 650.0000 mg | ORAL_TABLET | Freq: Four times a day (QID) | ORAL | Status: DC | PRN

## 2017-10-04 MED ORDER — SENNOSIDES-DOCUSATE SODIUM 8.6-50 MG PO TABS: 1.0000 | ORAL_TABLET | Freq: Every evening | ORAL | Status: DC | PRN

## 2017-10-04 MED ORDER — ADULT MULTIVITAMIN W/MINERALS CH
1.0000 | ORAL_TABLET | Freq: Every day | ORAL | Status: DC
  Administered 2017-10-05 – 2017-10-10 (×6): 1 via ORAL
  Filled 2017-10-04 (×6): qty 1

## 2017-10-04 MED ORDER — IPRATROPIUM-ALBUTEROL 0.5-2.5 (3) MG/3ML IN SOLN
3.0000 mL | Freq: Four times a day (QID) | RESPIRATORY_TRACT | Status: DC
  Administered 2017-10-04 – 2017-10-09 (×18): 3 mL via RESPIRATORY_TRACT
  Filled 2017-10-04 (×18): qty 3

## 2017-10-04 MED ORDER — SODIUM CHLORIDE 0.9 % IV SOLN
2.0000 g | Freq: Two times a day (BID) | INTRAVENOUS | Status: DC
  Administered 2017-10-04 – 2017-10-05 (×2): 2 g via INTRAVENOUS
  Filled 2017-10-04 (×3): qty 2

## 2017-10-04 MED ORDER — SERTRALINE HCL 50 MG PO TABS
100.0000 mg | ORAL_TABLET | Freq: Every day | ORAL | Status: DC
  Administered 2017-10-05 – 2017-10-10 (×6): 100 mg via ORAL
  Filled 2017-10-04 (×6): qty 2

## 2017-10-04 MED ORDER — AMLODIPINE BESYLATE 5 MG PO TABS
5.0000 mg | ORAL_TABLET | Freq: Every day | ORAL | Status: DC
  Administered 2017-10-05 – 2017-10-06 (×2): 5 mg via ORAL
  Filled 2017-10-04 (×2): qty 1

## 2017-10-04 MED ORDER — ENOXAPARIN SODIUM 40 MG/0.4ML ~~LOC~~ SOLN
40.0000 mg | SUBCUTANEOUS | Status: DC
  Administered 2017-10-04 – 2017-10-08 (×5): 40 mg via SUBCUTANEOUS
  Filled 2017-10-04 (×6): qty 0.4

## 2017-10-04 MED ORDER — METHYLPREDNISOLONE SODIUM SUCC 40 MG IJ SOLR
40.0000 mg | Freq: Two times a day (BID) | INTRAMUSCULAR | Status: DC
  Administered 2017-10-05 – 2017-10-07 (×5): 40 mg via INTRAVENOUS
  Filled 2017-10-04 (×5): qty 1

## 2017-10-04 MED ORDER — METHYLPREDNISOLONE SODIUM SUCC 125 MG IJ SOLR: 60.0000 mg | Freq: Four times a day (QID) | INTRAMUSCULAR | Status: DC

## 2017-10-04 MED ORDER — SODIUM CHLORIDE 0.9 % IV SOLN: 500.0000 mg | Freq: Once | INTRAVENOUS | Status: DC

## 2017-10-04 MED ORDER — SODIUM CHLORIDE 0.9 % IV SOLN: 250.0000 mL | INTRAVENOUS | Status: DC | PRN

## 2017-10-04 MED ORDER — ACETAMINOPHEN 650 MG RE SUPP: 650.0000 mg | Freq: Four times a day (QID) | RECTAL | Status: DC | PRN

## 2017-10-04 MED ORDER — FOLIC ACID 1 MG PO TABS
1.0000 mg | ORAL_TABLET | Freq: Every day | ORAL | Status: DC
  Administered 2017-10-05 – 2017-10-10 (×6): 1 mg via ORAL
  Filled 2017-10-04 (×7): qty 1

## 2017-10-04 MED ORDER — FUROSEMIDE 10 MG/ML IJ SOLN
40.0000 mg | Freq: Once | INTRAMUSCULAR | Status: AC
  Administered 2017-10-05: 40 mg via INTRAVENOUS
  Filled 2017-10-04: qty 4

## 2017-10-04 MED ORDER — SODIUM CHLORIDE 0.9% FLUSH: 3.0000 mL | INTRAVENOUS | Status: DC | PRN

## 2017-10-04 MED ORDER — ASPIRIN EC 81 MG PO TBEC
81.0000 mg | DELAYED_RELEASE_TABLET | Freq: Every day | ORAL | Status: DC
  Administered 2017-10-05 – 2017-10-10 (×6): 81 mg via ORAL
  Filled 2017-10-04 (×6): qty 1

## 2017-10-04 MED ORDER — ALBUTEROL SULFATE (2.5 MG/3ML) 0.083% IN NEBU: 2.5000 mg | INHALATION_SOLUTION | RESPIRATORY_TRACT | Status: DC | PRN

## 2017-10-04 MED ORDER — LEVOTHYROXINE SODIUM 100 MCG PO TABS
200.0000 ug | ORAL_TABLET | Freq: Every day | ORAL | Status: DC
  Administered 2017-10-05 – 2017-10-10 (×6): 200 ug via ORAL
  Filled 2017-10-04 (×6): qty 2

## 2017-10-04 MED ORDER — SODIUM CHLORIDE 0.9% FLUSH
3.0000 mL | Freq: Two times a day (BID) | INTRAVENOUS | Status: DC
Start: 2017-10-04 — End: 2017-10-10
  Administered 2017-10-04 – 2017-10-10 (×8): 3 mL via INTRAVENOUS

## 2017-10-04 MED ORDER — HYDROMORPHONE HCL 1 MG/ML IJ SOLN: 0.5000 mg | Freq: Once | INTRAMUSCULAR | Status: DC

## 2017-10-04 MED ORDER — ONDANSETRON HCL 4 MG/2ML IJ SOLN: 4.0000 mg | Freq: Four times a day (QID) | INTRAMUSCULAR | Status: DC | PRN

## 2017-10-04 MED ORDER — METHYLPREDNISOLONE SODIUM SUCC 125 MG IJ SOLR
125.0000 mg | Freq: Once | INTRAMUSCULAR | Status: AC
  Administered 2017-10-05: 125 mg via INTRAVENOUS
  Filled 2017-10-04: qty 2

## 2017-10-04 MED ORDER — ALBUTEROL SULFATE (2.5 MG/3ML) 0.083% IN NEBU
2.5000 mg | INHALATION_SOLUTION | Freq: Once | RESPIRATORY_TRACT | Status: AC
  Administered 2017-10-04: 2.5 mg via RESPIRATORY_TRACT
  Filled 2017-10-04: qty 3

## 2017-10-04 MED ORDER — ONDANSETRON HCL 4 MG PO TABS: 4.0000 mg | ORAL_TABLET | Freq: Four times a day (QID) | ORAL | Status: DC | PRN

## 2017-10-04 MED ORDER — CLOPIDOGREL BISULFATE 75 MG PO TABS
75.0000 mg | ORAL_TABLET | Freq: Every day | ORAL | Status: DC
  Administered 2017-10-05 – 2017-10-10 (×6): 75 mg via ORAL
  Filled 2017-10-04 (×6): qty 1

## 2017-10-04 NOTE — ED Triage Notes (Signed)
Pt fell earlier today, sustained head injury at this time. Pt fell again, called EMS  A second time and was transported for multiple falls. Pt c/o exacerbated chronic back pain. Pt requested transport for frequent falls. Pt denies anticoagulation.

## 2017-10-04 NOTE — ED Provider Notes (Signed)
[facility] Emergency Department Provider Note  ____________________________________________   First MD Initiated Contact with Patient 10/04/17 1519     (approximate)  I have reviewed the triage vital signs and the nursing notes.   HISTORY  Chief Complaint Fall and Head Injury   HPI Illene LabradorDanny L Berrocal is a 72 y.o. male with a history of CHF as well as COPD on 3 L of home oxygen who is presenting to the emergency department today after a fall.  The patient says that he has frequent falls.  Says that when he falls, including today, that he just blacks out.  He says that he was walking with his walker this morning when he passed out and fell to the floor, hitting the back of his head.  He is unsure how long exactly he was unconscious.  He says that at this time he is having low back pain which she describes as chronic.  Also sustained a laceration to the back of his head.  Says that his last tetanus shot was within 1 year.  Denies any chest pain.  Says that he is feeling short of breath.  Past Medical History:  Diagnosis Date  . Antalgic gait   . Carotid artery occlusion   . CHF (congestive heart failure) (HCC)   . Chronic neck and back pain   . Complication of anesthesia    pt has had cervical fusion-limited neck flextion  . COPD (chronic obstructive pulmonary disease) (HCC)   . Depression   . DJD (degenerative joint disease)    Right shoulder  . Hypertension   . Lumbago   . Peripheral neuropathy     Patient Active Problem List   Diagnosis Date Noted  . Respiratory failure (HCC) 10/04/2017  . PNA (pneumonia) 09/09/2017  . Adjustment disorder with mixed anxiety and depressed mood   . Hyponatremia 06/24/2017  . Hematemesis without nausea   . Pressure injury of skin 09/14/2016  . Weakness 09/13/2016  . UTI (urinary tract infection) 09/13/2016  . Chronic diastolic CHF (congestive heart failure) (HCC) 09/13/2016  . COPD (chronic obstructive pulmonary  disease) (HCC) 09/13/2016  . Carotid stenosis 09/09/2016  . Carotid artery stenosis 08/23/2016  . Pancytopenia (HCC) 08/23/2016  . Hyperkalemia 08/23/2016  . Leukocytosis 07/27/2016  . Thrombocytopenia (HCC) 07/27/2016  . Anemia 07/27/2016  . 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 Dysphagia, pharyngoesophageal phase 05/18/2016  . GERD without esophagitis 05/09/2016  . Bilateral lower extremity edema 05/09/2016  . Slow transit constipation 05/09/2016  . Depression with anxiety 05/09/2016  . Respiratory failure with hypoxia and hypercapnia (HCC) 07/22/2015  . Cervical vertebral fusion   . Depression   . Chronic neck and back pain   . COPD exacerbation (HCC)   . Congestive dilated cardiomyopathy (HCC)   . Arthritis of right hip 08/04/2014  . Essential hypertension 08/04/2014  . Hypothyroidism 08/04/2014  . Syncope 08/04/2014  . Fall 08/04/2014    Past Surgical History:  Procedure Laterality Date  . ABDOMINAL AORTIC ANEURYSM REPAIR  2009   Endovascular AAA repair  . ABDOMINAL AORTIC ANEURYSM REPAIR    . CAROTID ANGIOGRAPHY  09/09/2016   Procedure: Carotid Angiography;  Surgeon: Nada LibmanVance W Brabham, MD;  [facility];  Service: Cardiovascular;;  rt. carotid  . CARPAL TUNNEL RELEASE     bilateral CTS  . ESOPHAGOGASTRODUODENOSCOPY (EGD) WITH PROPOFOL N/A 09/28/2016   Procedure: ESOPHAGOGASTRODUODENOSCOPY (EGD) WITH PROPOFOL;  Surgeon: Midge Miniumarren Wohl, MD;  Location: [facility] ENDOSCOPY;  Service: Endoscopy;  Laterality: N/A;  . HAMMER TOE SURGERY  12/10/2011   Procedure: HAMMER TOE CORRECTION;  Surgeon: Ernestene Kiel, DPM;  Location: [facility];  Service: Podiatry;  Laterality: Right;  Right hammertoe repair second right with 2.5x42 orthopro screw   . IR GENERIC HISTORICAL  04/05/2016   IR GASTROSTOMY TUBE MOD SED 04/05/2016 [facility]  . IR GENERIC HISTORICAL  05/12/2016   IR CM INJ ANY COLONIC TUBE W/FLUORO  05/12/2016 Malachy Moan, MD [facility]  . IR GENERIC HISTORICAL  05/26/2016   IR CM INJ ANY COLONIC TUBE W/FLUORO 05/26/2016 [facility]  . IR GENERIC HISTORICAL  05/28/2016   IR REPLC GASTRO/COLONIC TUBE PERCUT W/FLUORO 05/28/2016 Brayton El, PA-C [facility]  . IR GENERIC HISTORICAL  07/02/2016   IR GASTROSTOMY TUBE REMOVAL 07/02/2016 Brayton El, PA-C [facility]  . METATARSAL OSTEOTOMY  12/10/2011   Procedure: METATARSAL OSTEOTOMY;  Surgeon: Ernestene Kiel, DPM;  Location: [facility];  Service: Podiatry;  Laterality: Right;  Right Keller arthroplasty with size 5 silicone implant right great toe; second metatarsal osteotomy with 2.0x14 screw  . NECK SURGERY    . PERIPHERAL VASCULAR CATHETERIZATION N/A 02/11/2015   Procedure: Carotid Angiography;  Surgeon: Nada Libman, MD;  Location: [facility] INVASIVE CV LAB;  Service: Cardiovascular;  Laterality: N/A;  . PILONIDAL CYST / SINUS EXCISION    . Rotary cuff    . SPINE SURGERY  09/15/11   Scar tissue removed- back  . SPINE SURGERY  09/15/11   Scar tissue removed- back  . TONSILLECTOMY    . TRACHEOSTOMY TUBE PLACEMENT N/A 03/03/2016   Procedure: TRACHEOSTOMY;  Surgeon: Geanie Logan, MD;  Location: [facility] ORS;  Service: ENT;  Laterality: N/A;    Prior to Admission medications   Medication Sig Start Date End Date Taking? Authorizing Provider  amLODipine (NORVASC) 5 MG tablet Take 5 mg by mouth daily. 09/29/17  Yes [provider]  aspirin EC 81 MG tablet Take 81 mg by mouth daily.   Yes [provider]  baclofen (LIORESAL) 10 MG tablet Take 10 mg by mouth 2 (two) times daily as needed for muscle spasms.    Yes [provider]  clopidogrel (PLAVIX) 75 MG tablet TAKE 1 TABLET BY MOUTH EVERY DAY 09/20/17  Yes Chuck Hint, MD  furosemide (LASIX) 20 MG tablet Take 1 tablet (20 mg total) by mouth daily. 09/13/17 10/13/17 Yes Sudini, Wardell Heath, MD  hydrochlorothiazide (HYDRODIURIL) 25 MG tablet Take 1  tablet (25 mg total) by mouth daily. 09/13/17  Yes Sudini, Wardell Heath, MD  Boris Lown Oil Omega-3 500 MG CAPS Take 1 tablet by mouth daily.   Yes [provider]  levothyroxine (SYNTHROID, LEVOTHROID) 200 MCG tablet Take 200 mcg by mouth daily before breakfast.   Yes [provider]  metoprolol tartrate (LOPRESSOR) 25 MG tablet Take 1 tablet (25 mg total) by mouth 2 (two) times daily. 07/27/16  Yes Katharina Caper, MD  Multiple Vitamin (MULTIVITAMIN WITH MINERALS) TABS tablet Take 1 tablet by mouth daily.   Yes [provider]  OXYGEN 4 L/min by Intratracheal route as needed (for shortness of breath).    Yes [provider]  SYMBICORT 160-4.5 MCG/ACT inhaler TAKE 2 PUFFS BY MOUTH TWICE A DAY 06/01/17  Yes Merwyn Katos, MD  budesonide (PULMICORT) 0.5 MG/2ML nebulizer solution Take 2 mLs (0.5 mg total) by nebulization 2 (two) times daily. Patient not taking: Reported on 10/04/2017 12/21/16   Billy Fischer  B, MD  HYDROcodone-acetaminophen (NORCO/VICODIN) 5-325 MG tablet Take 1 tablet by mouth every 4 (four) hours as needed. Patient not taking: Reported on 10/04/2017 06/27/17   Shaune Pollack, MD  ipratropium-albuterol (DUONEB) 0.5-2.5 (3) MG/3ML SOLN Take 3 mLs by nebulization every 6 (six) hours as needed. Dx:J44.9 Patient not taking: Reported on 10/04/2017 12/21/16   Merwyn Katos, MD  sertraline (ZOLOFT) 100 MG tablet Take 1 tablet (100 mg total) by mouth daily. Patient not taking: Reported on 10/04/2017 06/27/17   Shaune Pollack, MD    Allergies Ampicillin; Nortriptyline; Brimonidine; Cyclobenzaprine; Tizanidine; and Gabapentin  Family History  Problem Relation Age of Onset  . Stomach cancer Mother   . Hypertension Brother   . Heart attack Father     Social History Social History   Tobacco Use  . Smoking status: Former Smoker    Packs/day: 1.00    Years: 30.00    Pack years: 30.00    Types: Cigarettes    Last attempt to quit: 08/30/2006    Years since quitting: 11.1    . Smokeless tobacco: Never Used  Substance Use Topics  . Alcohol use: Yes    Alcohol/week: 1.2 oz    Types: 2 Cans of beer per week    Comment: occasional use  . Drug use: No    Review of Systems  Constitutional: No fever/chills Eyes: No visual changes. ENT: No sore throat. Cardiovascular: Denies chest pain. Respiratory: As above Gastrointestinal: No abdominal pain.  No nausea, no vomiting.  No diarrhea.  No constipation. Genitourinary: Negative for dysuria. Musculoskeletal: Low back pain that is nonradiating.  Says that it is to the middle of his lumbar spine. Skin: Negative for rash. Neurological: Negative for headaches, focal weakness or numbness.   ____________________________________________   PHYSICAL EXAM:  VITAL SIGNS: ED Triage Vitals  Enc Vitals Group     BP 10/04/17 1517 (!) 149/70     Pulse Rate 10/04/17 1517 77     Resp 10/04/17 1517 (!) 22     Temp 10/04/17 1517 (!) 97.4 F (36.3 C)     Temp Source 10/04/17 1517 Oral     SpO2 10/04/17 1511 95 %     Weight 10/04/17 1524 228 lb (103.4 kg)     Height 10/04/17 1524 5\' 8"  (1.727 m)     Head Circumference --      Peak Flow --      Pain Score 10/04/17 1521 0     Pain Loc --      Pain Edu? --      Excl. in GC? --     Constitutional: Alert and oriented. Well appearing and in no acute distress.  Requiring help to ambulate from the stretcher to his bed.  Normally walks with a walker.   Eyes: Conjunctivae are normal.  Head: 1 cm, superficial laceration over the occiput with surrounding hematoma which is circular and about 6 cm in diameter.  No bogginess or depression. Nose: No congestion/rhinnorhea. Mouth/Throat: Mucous membranes are moist.  Neck: No stridor.  No tenderness to palpation to the midline cervical spine.  No deformity or step-off.  Cardiovascular: Normal rate, regular rhythm. Grossly normal heart sounds.  Respiratory: Labored respirations with suprasternal retractions.  Very poor air movement on  expiration without audible wheezes throughout all fields. Gastrointestinal: Soft and nontender. No distention. No CVA tenderness. Musculoskeletal: Moderate bilateral lower extremity edema.  No tenderness to palpation to the lower lumbar spine or lumbar region.  Midline low lumbar surgical scar  which is CDI and well-healed. Neurologic:  Normal speech and language. No gross focal neurologic deficits are appreciated. Skin:  Skin is warm, dry and intact. No rash noted. Psychiatric: Mood and affect are normal. Speech and behavior are normal.  ____________________________________________   LABS (all labs ordered are listed, but only abnormal results are displayed)  Labs Reviewed  BLOOD GAS, VENOUS - Abnormal; Notable for the following components:      Result Value   Bicarbonate 47.5 (*)    Acid-Base Excess 17.4 (*)    All other components within normal limits  BRAIN NATRIURETIC PEPTIDE - Abnormal; Notable for the following components:   B Natriuretic Peptide 221.0 (*)    All other components within normal limits  CBC WITH DIFFERENTIAL/PLATELET - Abnormal; Notable for the following components:   RBC 3.45 (*)    Hemoglobin 11.1 (*)    HCT 32.3 (*)    RDW 15.7 (*)    Platelets 133 (*)    Lymphs Abs 0.7 (*)    All other components within normal limits  COMPREHENSIVE METABOLIC PANEL - Abnormal; Notable for the following components:   Sodium 125 (*)    Chloride 81 (*)    CO2 39 (*)    BUN 25 (*)    Calcium 8.7 (*)    ALT 14 (*)    Total Bilirubin 1.5 (*)    All other components within normal limits  CBC - Abnormal; Notable for the following components:   RBC 3.54 (*)    Hemoglobin 11.1 (*)    HCT 33.1 (*)    RDW 15.8 (*)    Platelets 125 (*)    All other components within normal limits  BASIC METABOLIC PANEL - Abnormal; Notable for the following components:   Sodium 129 (*)    Chloride 85 (*)    CO2 39 (*)    BUN 23 (*)    Calcium 8.6 (*)    All other components within normal  limits  CBC - Abnormal; Notable for the following components:   RBC 3.34 (*)    Hemoglobin 10.6 (*)    HCT 31.2 (*)    RDW 15.8 (*)    Platelets 123 (*)    All other components within normal limits  CULTURE, BLOOD (ROUTINE X 2)  CULTURE, BLOOD (ROUTINE X 2)  MRSA PCR SCREENING  TROPONIN I  LACTIC ACID, PLASMA  GLUCOSE, CAPILLARY  CREATININE, SERUM  URINALYSIS, ROUTINE W REFLEX MICROSCOPIC  BASIC METABOLIC PANEL   ____________________________________________  EKG  ED ECG REPORT I, Arelia Longest, the attending physician, personally viewed and interpreted this ECG.   Date: 10/04/2017  EKG Time: 1522  Rate: 75  Rhythm: normal sinus rhythm  Axis: Normal  Intervals:First-degree AV block  ST&T Change: No ST segment elevation or depression.  No abnormal T wave inversion.  ____________________________________________  RADIOLOGY  Negative CTs of the head neck.  Chest x-ray with pulmonary vascular congestion.  No obvious focal consolidation. ____________________________________________   PROCEDURES  Procedure(s) performed:   .Critical Care Performed by: Myrna Blazer, MD Authorized by: Myrna Blazer, MD   Critical care provider statement:    Critical care time (minutes):  35   Critical care was necessary to treat or prevent imminent or life-threatening deterioration of the following conditions:  Respiratory failure   Critical care was time spent personally by me on the following activities:  Examination of patient, ordering and performing treatments and interventions, ordering and review of laboratory studies, ordering and  review of radiographic studies, pulse oximetry and re-evaluation of patient's condition    Critical Care performed:   ____________________________________________   INITIAL IMPRESSION / ASSESSMENT AND PLAN / ED COURSE  Pertinent labs & imaging results that were available during my care of the patient were reviewed by me  and considered in my medical decision making (see chart for details).  DDX: COPD, CHF, encephalopathy, skull fracture, laceration, respiratory distress, pneumonia, lumbar fracture, contusion, syncope As part of my medical decision making, I reviewed the following data within the electronic MEDICAL RECORD NUMBER Notes from prior ED visits  ----------------------------------------- 6:12 PM on 10/04/2017 -----------------------------------------  Patient tolerating BiPAP and is more awake and alert than previous. ____________________________________________   FINAL CLINICAL IMPRESSION(S) / ED DIAGNOSES  Final diagnoses:  Edema   Hypercapnic respiratory failure.  CHF.   NEW MEDICATIONS STARTED DURING THIS VISIT:  Current Discharge Medication List       Note:  This document was prepared using Dragon voice recognition software and may include unintentional dictation errors.     Myrna Blazer, MD 10/05/17 [phone]

## 2017-10-04 NOTE — Consult Note (Signed)
Name: Chad Huffman MRN: [id number] DOB: 1946/05/29    ADMISSION DATE:  10/04/2017 CONSULTATION DATE: 10/04/2017  REFERRING MD : Dr. Tobi Bastos   CHIEF COMPLAINT: s/p Fall and Shortness of Breath   BRIEF PATIENT DESCRIPTION:  72 yo male admitted s/p fall at home with scalp contusion and acute on chronic respiratory failure secondary to AECOPD requiring Bipap  SIGNIFICANT EVENTS  04/16-Pt admitted to stepdown unit on Bipap   STUDIES:  CT Head 04/16>>Mild soft tissue contusion in the midline parietal scalp. No calvarial fracture. No acute intracranial abnormality. Stable chronic microvascular ischemic changes and parenchymal volume loss of the brain. 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 CT Cervical Spine 04/16>>No acute fracture or dislocation. Occipital to T2 posterior spinal fusion. No apparent hardware related complication. Stable advanced degenerative changes of the cervical spine.  HISTORY OF PRESENT ILLNESS:   This is a 72 yo male with a PMH of Peripheral Neuropathy, Lumbago, HTN, Depression, COPD, Chronic Pain, CHF, Carotid Artery Occlusion, and Antalgic Gait.  He presented to [facility] ER 04/16 following a fall at home at which point he hit his head and c/o shortness of breath.  In the ER CT Head showed no acute abnormality, CT Cervical Spine negative for acute fracture, and x-ray lumbar spine showed no fractures.  Due to respiratory distress he was placed on Bipap.  Lab results revealed Na+ 125, BNP 221, hgb 11.1, platelets 133, and venous gas resulted pH 7.33.  CXR revealed mild bibasilar atelectasis. He was subsequently admitted to the stepdown unit by hospitalist team for further workup and treatment.  PAST MEDICAL HISTORY :   has a past medical history of Antalgic gait, Carotid artery occlusion, CHF (congestive heart failure) (HCC), Chronic neck and back pain, Complication of anesthesia, COPD (chronic obstructive pulmonary disease) (HCC), Depression,  DJD (degenerative joint disease), Hypertension, Lumbago, and Peripheral neuropathy.  has a past surgical history that includes Tonsillectomy; Carpal tunnel release; Abdominal aortic aneurysm repair (2009); Pilonidal cyst / sinus excision; Spine surgery (09/15/11); Spine surgery (09/15/11); Neck surgery; Rotary cuff; Hammer toe surgery (12/10/2011); Metatarsal osteotomy (12/10/2011); Abdominal aortic aneurysm repair; Cardiac catheterization (N/A, 02/11/2015); Tracheostomy tube placement (N/A, 03/03/2016); ir generic historical (04/05/2016); ir generic historical (05/12/2016); ir generic historical (05/26/2016); ir generic historical (05/28/2016); ir generic historical (07/02/2016); CAROTID ANGIOGRAPHY (09/09/2016); and Esophagogastroduodenoscopy (egd) with propofol (N/A, 09/28/2016). Prior to Admission medications   Medication Sig Start Date End Date Taking? Authorizing Provider  amLODipine (NORVASC) 5 MG tablet Take 5 mg by mouth daily. 09/29/17  Yes [provider]  aspirin EC 81 MG tablet Take 81 mg by mouth daily.   Yes [provider]  baclofen (LIORESAL) 10 MG tablet Take 10 mg by mouth 2 (two) times daily as needed for muscle spasms.    Yes [provider]  clopidogrel (PLAVIX) 75 MG tablet TAKE 1 TABLET BY MOUTH EVERY DAY 09/20/17  Yes Chuck Hint, MD  furosemide (LASIX) 20 MG tablet Take 1 tablet (20 mg total) by mouth daily. 09/13/17 10/13/17 Yes Sudini, Wardell Heath, MD  hydrochlorothiazide (HYDRODIURIL) 25 MG tablet Take 1 tablet (25 mg total) by mouth daily. 09/13/17  Yes Sudini, Wardell Heath, MD  Boris Lown Oil Omega-3 500 MG CAPS Take 1 tablet by mouth daily.   Yes [provider]  levothyroxine (SYNTHROID, LEVOTHROID) 200 MCG tablet Take 200 mcg by mouth daily before breakfast.   Yes [provider]  metoprolol tartrate (LOPRESSOR) 25 MG tablet Take 1 tablet (25 mg total) by mouth  2 (two) times daily. 07/27/16  Yes Katharina CaperVaickute, Rima, MD  Multiple Vitamin (MULTIVITAMIN  WITH MINERALS) TABS tablet Take 1 tablet by mouth daily.   Yes [provider]  OXYGEN 4 L/min by Intratracheal route as needed (for shortness of breath).    Yes [provider]  SYMBICORT 160-4.5 MCG/ACT inhaler TAKE 2 PUFFS BY MOUTH TWICE A DAY 06/01/17  Yes Merwyn KatosSimonds, [name] B, MD  budesonide (PULMICORT) 0.5 MG/2ML nebulizer solution Take 2 mLs (0.5 mg total) by nebulization 2 (two) times daily. Patient not taking: Reported on 10/04/2017 12/21/16   Merwyn KatosSimonds, [name] B, MD  HYDROcodone-acetaminophen (NORCO/VICODIN) 5-325 MG tablet Take 1 tablet by mouth every 4 (four) hours as needed. Patient not taking: Reported on 10/04/2017 06/27/17   Shaune Pollackhen, Qing, MD  ipratropium-albuterol (DUONEB) 0.5-2.5 (3) MG/3ML SOLN Take 3 mLs by nebulization every 6 (six) hours as needed. Dx:J44.9 Patient not taking: Reported on 10/04/2017 12/21/16   Merwyn KatosSimonds, [name] B, MD  sertraline (ZOLOFT) 100 MG tablet Take 1 tablet (100 mg total) by mouth daily. Patient not taking: Reported on 10/04/2017 06/27/17   Shaune Pollackhen, Qing, MD   Allergies  Allergen Reactions  . Ampicillin Swelling and Other (See Comments)    Unable to obtain enough information to answer additional questions about this medication.  Has patient had a PCN reaction causing immediate rash, facial/tongue/throat swelling, SOB or lightheadedness with hypotension: Yes Has patient had a PCN reaction causing severe rash involving mucus membranes or skin necrosis: No Has patient had a PCN reaction that required hospitalization No Has patient had a PCN reaction occurring within the last 10 years: Yes If all of the above answers are "NO", then may pr  . Nortriptyline Other (See Comments)    Sleep walking  . Brimonidine Other (See Comments)    Unknown reaction per MAR   . Cyclobenzaprine Other (See Comments)    Hallucinations  . Tizanidine Other (See Comments)    Hallucinations  . Gabapentin Rash    FAMILY HISTORY:  family history includes Heart attack in his  father; Hypertension in his brother; Stomach cancer in his mother. SOCIAL HISTORY:  reports that he quit smoking about 11 years ago. His smoking use included cigarettes. He has a 30.00 pack-year smoking history. He has never used smokeless tobacco. He reports that he drinks about 1.2 oz of alcohol per week. He reports that he does not use drugs.  REVIEW OF SYSTEMS: Positives in BOLD Constitutional: Negative for fever, chills, weight loss, malaise/fatigue and diaphoresis.  HENT: Negative for hearing loss, ear pain, nosebleeds, congestion, sore throat, neck pain, tinnitus and ear discharge.   Eyes: Negative for blurred vision, double vision, photophobia, pain, discharge and redness.  Respiratory: cough, hemoptysis, sputum production, shortness of breath, wheezing and stridor.   Cardiovascular: Negative for chest pain, palpitations, orthopnea, claudication, leg swelling and PND.  Gastrointestinal: Negative for heartburn, nausea, vomiting, abdominal pain, diarrhea, constipation, blood in stool and melena.  Genitourinary: Negative for dysuria, urgency, frequency, hematuria and flank pain.  Musculoskeletal: myalgias, back pain, joint pain and falls.  Skin: Negative for itching and rash.  Neurological: Negative for dizziness, tingling, tremors, sensory change, speech change, focal weakness, seizures, loss of consciousness, weakness and headaches.  Endo/Heme/Allergies: Negative for environmental allergies and polydipsia. Does not bruise/bleed easily.  SUBJECTIVE:  c/o chronic back pain   VITAL SIGNS: Temp:  [97.4 F (36.3 C)-98.1 F (36.7 C)] 98.1 F (36.7 C) (04/16 2002) Pulse Rate:  [76-117] 77 (04/16 1900) Resp:  [13-22] 16 (04/16 2002)  BP: (137-197)/(68-129) 197/95 (04/16 2002) SpO2:  [87 %-100 %] 99 % (04/16 2002) FiO2 (%):  [32 %] 32 % (04/16 2002) Weight:  [103.4 kg (228 lb)] 103.4 kg (228 lb) (04/16 1524)  PHYSICAL EXAMINATION: General: well developed, well nourished, NAD on Bipap    Neuro: alert and oriented, follows commands HEENT: supple, no JVD  Cardiovascular: nsr, rrr, no R/G Lungs: diminished throughout, even, non labored  Abdomen: +BS x4, soft, non tender, non distended  Musculoskeletal: bilateral lower extremity edema right greater than left Skin: bilateral lower extremities warm to touch with erythema   Recent Labs  Lab 10/04/17 1611  NA 125*  K 4.2  CL 81*  CO2 39*  BUN 25*  CREATININE 0.89  0.90  GLUCOSE 92   Recent Labs  Lab 10/04/17 1611  HGB 11.1*  HCT 32.3*  WBC 5.9  PLT 133*   Dg Chest 1 View  Result Date: 10/04/2017 CLINICAL DATA:  Pain following fall EXAM: CHEST  1 VIEW COMPARISON:  Chest radiograph September 09, 2017 and chest CT September 09, 2017 FINDINGS: Mild elevation of the right hemidiaphragm is stable. There is mild bibasilar atelectasis. There is no edema or consolidation. Heart is borderline enlarged with pulmonary vascularity within normal limits. No adenopathy. There is aortic atherosclerosis. Postoperative changes noted in the lower cervical and upper thoracic regions. There is advanced arthropathy in the left shoulder. 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 No pneumothorax. IMPRESSION: Mild bibasilar atelectasis. No edema or consolidation. Stable cardiac silhouette. There is aortic atherosclerosis. Aortic Atherosclerosis (ICD10-I70.0). Electronically Signed   By: Bretta Bang III M.D.   On: 10/04/2017 18:18   Dg Lumbar Spine Complete  Result Date: 10/04/2017 CLINICAL DATA:  Pain following fall EXAM: LUMBAR SPINE - COMPLETE 4+ VIEW COMPARISON:  June 13, 2017 FINDINGS: Frontal, lateral, spot lumbosacral lateral, and bilateral oblique views were obtained. The there are 5 non-rib-bearing lumbar type vertebral bodies. T12 ribs are hypoplastic. There is postoperative change with posterior screw and plate fixation at L4 and L5. There is a disc spacer at L4-5. There is no fracture. There is minimal  retrolisthesis of L1 on L2 and minimal retrolisthesis of L2 on L3, stable. There is no new spondylolisthesis. There is disc space narrowing at L3-4 which has increased from prior study. Disc space narrowing at L4-5 and L5-S1 are stable. There is facet osteoarthritic change at L4-5 and L5-S1 bilaterally and at L3-4 on the left, similar to prior studies. There is a stent in the aorta and iliac arteries. IMPRESSION: Postoperative change at L4 and L5. No fracture. Slight retrolisthesis of L1 on L2 and L2 on L3, stable, likely due to spondylosis. Slight increase in disc space narrowing at L3-4. Stable disc space narrowing at L4-5 and L5-S1. Stable facet osteoarthritic changes several levels. Stent in aorta and iliac arteries again noted. Electronically Signed   By: Bretta Bang III M.D.   On: 10/04/2017 18:17   Ct Head Wo Contrast  Result Date: 10/04/2017 CLINICAL DATA:  71 y/o  M; multiple falls with head trauma. EXAM: CT HEAD WITHOUT CONTRAST CT CERVICAL SPINE WITHOUT CONTRAST TECHNIQUE: Multidetector CT imaging of the head and cervical spine was performed following the standard protocol without intravenous contrast. Multiplanar CT image reconstructions of the cervical spine were also generated. COMPARISON:  09/26/2017 CT head and cervical spine. 09/26/2017 MRI head. 02/23/2017 MRI cervical spine. FINDINGS: CT HEAD FINDINGS Brain: No evidence of acute infarction, hemorrhage, hydrocephalus, extra-axial collection or mass lesion/mass effect.  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 Stable chronic microvascular ischemic changes and parenchymal volume loss of the brain. Vascular: Extensive calcific atherosclerosis of carotid siphons and vertebral arteries. Skull: Mild soft tissue contusion to the midline parietal scalp. No calvarial fracture. Sinuses/Orbits: Partial right mastoid effusion.  Otherwise  negative. Other: None. CT CERVICAL SPINE FINDINGS Alignment: Straightening of cervical lordosis without listhesis. Grade 1 C2-3 anterolisthesis. Skull base and vertebrae: There is fusion of the right atlantooccipital joint and left atlantoaxial joint. C1-C6 laminectomy. Fusion of the posterior elements to the occipital bone rods and facet screws hardware extending to the T2 level. No periprosthetic lucency or fracture identified. The facets are fused on the right from C2-C7 with persistent arthrosis at C7-T1. The facets on the left are fused from C2 to C5 with persistent arthrosis at C5-6 and below. Soft tissues and spinal canal: No prevertebral fluid or swelling. No visible canal hematoma. Disc levels: Uncovertebral and facet hypertrophy with bilateral foraminal stenosis at the C2 through C5 levels. Upper chest: Negative. Other: Negative. IMPRESSION: CT head: 1. Mild soft tissue contusion in the midline parietal scalp. No calvarial fracture. No acute intracranial abnormality. 2. Stable chronic microvascular ischemic changes and parenchymal volume loss of the brain. 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 CT cervical spine: 1. No acute fracture or dislocation. 2. Occipital to T2 posterior spinal fusion. No apparent hardware related complication. Stable advanced degenerative changes of the cervical spine. Electronically Signed   By: Mitzi Hansen M.D.   On: 10/04/2017 18:12   Ct Cervical Spine Wo Contrast  Result Date: 10/04/2017 CLINICAL DATA:  72 y/o  M; multiple falls with head trauma. EXAM: CT HEAD WITHOUT CONTRAST CT CERVICAL SPINE WITHOUT CONTRAST TECHNIQUE: Multidetector CT imaging of the head and cervical spine was performed following the standard protocol without intravenous contrast. Multiplanar CT image reconstructions of the cervical spine were also generated. COMPARISON:  09/26/2017 CT head and cervical spine. 09/26/2017 MRI head. 02/23/2017 MRI cervical  spine. FINDINGS: CT HEAD FINDINGS Brain: No evidence of acute infarction, hemorrhage, hydrocephalus, extra-axial collection or mass lesion/mass effect. 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 Stable chronic microvascular ischemic changes and parenchymal volume loss of the brain. Vascular: Extensive calcific atherosclerosis of carotid siphons and vertebral arteries. Skull: Mild soft tissue contusion to the midline parietal scalp. No calvarial fracture. Sinuses/Orbits: Partial right mastoid effusion.  Otherwise negative. Other: None. CT CERVICAL SPINE FINDINGS Alignment: Straightening of cervical lordosis without listhesis. Grade 1 C2-3 anterolisthesis. Skull base and vertebrae: There is fusion of the right atlantooccipital joint and left atlantoaxial joint. C1-C6 laminectomy. Fusion of the posterior elements to the occipital bone rods and facet screws hardware extending to the T2 level. No periprosthetic lucency or fracture identified. The facets are fused on the right from C2-C7 with persistent arthrosis at C7-T1. The facets on the left are fused from C2 to C5 with persistent arthrosis at C5-6 and below. Soft tissues and spinal canal: No prevertebral fluid or swelling. No visible canal hematoma. Disc levels: Uncovertebral and facet hypertrophy with bilateral foraminal stenosis at the C2 through C5 levels. Upper chest: Negative. Other: Negative. IMPRESSION: CT head: 1. Mild soft tissue contusion in the midline parietal scalp. No calvarial fracture. No acute intracranial abnormality. 2. Stable chronic microvascular ischemic changes and parenchymal volume loss of the brain. Stable small chronic infarction in  the right inferolateral frontal lobe. CT cervical spine: 1. No acute fracture or dislocation. 2. Occipital to T2 posterior spinal fusion. No apparent hardware related complication. Stable advanced  degenerative changes of the cervical spine. Electronically Signed   By: Mitzi Hansen M.D.   On: 10/04/2017 18:12    ASSESSMENT / PLAN: Acute on chronic respiratory failure secondary to AECOPD  Fall  Bilateral Cellulitis  Hyponatremia likely secondary to diuretic Anemia without obvious acute blood loss Thrombocytopenia  P: Prn Bipap for dyspnea and/or hypoxia  Scheduled and prn bronchodilator therapy IV and nebulized steroids  Hold lasix continue all other outpatient cardiac medications Continuous telemetry monitoring  Trend WBC and monitor fever curve  Trend PCT  Follow cultures Will start zosyn [place] Bilateral Lower Extrem r/o DVT  Trend BMP  Replace electrolytes as indicated  Monitor UOP VTE px: subq lovenox Trend CBC  Monitor for s/sx of bleeding and transfuse for hgb <7 Fall precautions   Sonda Rumble, AGNP  Pulmonary/Critical Care Pager [phone] (please enter 7 digits) PCCM Consult Pager [phone] (please enter 7 digits)

## 2017-10-04 NOTE — H&P (Signed)
[facility] [place] [facility]   PATIENT NAME: Chad PaddockDanny Huffman    MR#:  [id number]  DATE OF BIRTH:  03-Jun-1946  DATE OF ADMISSION:  10/04/2017  PRIMARY CARE PHYSICIAN: Chad BasemanBronstein, [name], MD   REQUESTING/REFERRING PHYSICIAN:   CHIEF COMPLAINT:   Chief Complaint  Patient presents with  . Fall  . Head Injury  shortness of breath  HISTORY OF PRESENT ILLNESS: Chad Huffman  is a 72 y.o. male with a known history of congestive heart failure, COPD, chronic neck and back pain, degenerative joint disease, hypertension, peripheral neuropathy presented to the emergency room for fall and shortness of breath.  He lost balance and fell and hit his head.  He was evaluated in the emergency room with CT head which showed no acute abnormality.  CT cervical spine showed no fractures.  X-ray of the lumbar spine showed no fractures.  Patient felt short of breath in the emergency room and was evaluated.  He was found to have elevated CO2 and put on BiPAP for respiratory distress.  He had wheezing and difficulty breathing.  He received IV Solu-Medrol and breathing treatments.  No fever and chills.  PAST MEDICAL HISTORY:   Past Medical History:  Diagnosis Date  . Antalgic gait   . Carotid artery occlusion   . CHF (congestive heart failure) (HCC)   . Chronic neck and back pain   . Complication of anesthesia    pt has had cervical fusion-limited neck flextion  . COPD (chronic obstructive pulmonary disease) (HCC)   . Depression   . DJD (degenerative joint disease)    Right shoulder  . Hypertension   . Lumbago   . Peripheral neuropathy     PAST SURGICAL HISTORY:  Past Surgical History:  Procedure Laterality Date  . ABDOMINAL AORTIC ANEURYSM REPAIR  2009   Endovascular AAA repair  . ABDOMINAL AORTIC ANEURYSM REPAIR    . CAROTID ANGIOGRAPHY  09/09/2016   Procedure: Carotid Angiography;  Surgeon: Nada LibmanVance W Brabham, MD;  [facility];  Service: Cardiovascular;;  rt.  carotid  . CARPAL TUNNEL RELEASE     bilateral CTS  . ESOPHAGOGASTRODUODENOSCOPY (EGD) WITH PROPOFOL N/A 09/28/2016   Procedure: ESOPHAGOGASTRODUODENOSCOPY (EGD) WITH PROPOFOL;  Surgeon: Midge Miniumarren Wohl, MD;  Location: [facility] ENDOSCOPY;  Service: Endoscopy;  Laterality: N/A;  . HAMMER TOE SURGERY  12/10/2011   Procedure: HAMMER TOE CORRECTION;  Surgeon: Ernestene KielMax Hyatt, DPM;  Location: [facility];  Service: Podiatry;  Laterality: Right;  Right hammertoe repair second right with 2.5x42 orthopro screw   . IR GENERIC HISTORICAL  04/05/2016   IR GASTROSTOMY TUBE MOD SED 04/05/2016 [facility]INTER[facility]  . IR GENERIC HISTORICAL  05/12/2016   IR CM INJ ANY COLONIC TUBE W/FLUORO 05/12/2016 Malachy MoanHeath McCullough, MD [facility]  . IR GENERIC HISTORICAL  05/26/2016   IR CM INJ ANY COLONIC TUBE W/FLUORO 05/26/2016 [facility]  . IR GENERIC HISTORICAL  05/28/2016   IR REPLC GASTRO/COLONIC TUBE PERCUT W/FLUORO 05/28/2016 Brayton ElKevin Bruning, PA-C [facility]INTERV [facility]  . IR GENERIC HISTORICAL  07/02/2016   IR GASTROSTOMY TUBE REMOVAL 07/02/2016 Brayton ElKevin Bruning, PA-C [facility]INTER[facility]  . METATARSAL OSTEOTOMY  12/10/2011   Procedure: METATARSAL OSTEOTOMY;  Surgeon: Ernestene KielMax Hyatt, DPM;  Location: [facility];  Service: Podiatry;  Laterality: Right;  Right Keller arthroplasty with size 5 silicone implant right great toe; second metatarsal osteotomy with 2.0x14 screw  . NECK SURGERY    . PERIPHERAL VASCULAR CATHETERIZATION N/A 02/11/2015   Procedure: Carotid  Angiography;  Surgeon: Nada Libman, MD;  Location: [facility] INVASIVE CV LAB;  Service: Cardiovascular;  Laterality: N/A;  . PILONIDAL CYST / SINUS EXCISION    . Rotary cuff    . SPINE SURGERY  09/15/11   Scar tissue removed- back  . SPINE SURGERY  09/15/11   Scar tissue removed- back  . TONSILLECTOMY    . TRACHEOSTOMY TUBE PLACEMENT N/A 03/03/2016   Procedure: TRACHEOSTOMY;  Surgeon: Geanie Logan, MD;  Location: [facility] ORS;  Service: ENT;  Laterality: N/A;    SOCIAL HISTORY:   Social History   Tobacco Use  . Smoking status: Former Smoker    Packs/day: 1.00    Years: 30.00    Pack years: 30.00    Types: Cigarettes    Last attempt to quit: 08/30/2006    Years since quitting: 11.1  . Smokeless tobacco: Never Used  Substance Use Topics  . Alcohol use: Yes    Alcohol/week: 1.2 oz    Types: 2 Cans of beer per week    Comment: occasional use    FAMILY HISTORY:  Family History  Problem Relation Age of Onset  . Stomach cancer Mother   . Hypertension Brother   . Heart attack Father     DRUG ALLERGIES:  Allergies  Allergen Reactions  . Ampicillin Swelling and Other (See Comments)    Unable to obtain enough information to answer additional questions about this medication.  Has patient had a PCN reaction causing immediate rash, facial/tongue/throat swelling, SOB or lightheadedness with hypotension: Yes Has patient had a PCN reaction causing severe rash involving mucus membranes or skin necrosis: No Has patient had a PCN reaction that required hospitalization No Has patient had a PCN reaction occurring within the last 10 years: Yes If all of the above answers are "NO", then may pr  . Nortriptyline Other (See Comments)    Sleep walking  . Brimonidine Other (See Comments)    Unknown reaction per MAR   . Cyclobenzaprine Other (See Comments)    Hallucinations  . Tizanidine Other (See Comments)    Hallucinations  . Gabapentin Rash    REVIEW OF SYSTEMS:   CONSTITUTIONAL: No fever, fatigue or weakness.  EYES: No blurred or double vision.  EARS, NOSE, AND THROAT: No tinnitus or ear pain.  RESPIRATORY: No cough, has  shortness of breath, wheezing  no hemoptysis.  CARDIOVASCULAR: No chest pain, orthopnea, edema.  GASTROINTESTINAL: No nausea, vomiting, diarrhea or abdominal pain.  GENITOURINARY: No dysuria, hematuria.  ENDOCRINE: No polyuria, nocturia,  HEMATOLOGY: No anemia, easy bruising or bleeding SKIN: No rash or lesion. MUSCULOSKELETAL: No joint  pain or arthritis.   NEUROLOGIC: No tingling, numbness, weakness.  PSYCHIATRY: No anxiety or depression.   MEDICATIONS AT HOME:  Prior to Admission medications   Medication Sig Start Date End Date Taking? Authorizing Provider  amLODipine (NORVASC) 5 MG tablet Take 5 mg by mouth daily. 09/29/17  Yes [provider]  aspirin EC 81 MG tablet Take 81 mg by mouth daily.   Yes [provider]  baclofen (LIORESAL) 10 MG tablet Take 10 mg by mouth 2 (two) times daily as needed for muscle spasms.    Yes [provider]  clopidogrel (PLAVIX) 75 MG tablet TAKE 1 TABLET BY MOUTH EVERY DAY 09/20/17  Yes Chuck Hint, MD  furosemide (LASIX) 20 MG tablet Take 1 tablet (20 mg total) by mouth daily. 09/13/17 10/13/17 Yes Sudini, Wardell Heath, MD  hydrochlorothiazide (HYDRODIURIL) 25 MG tablet Take 1 tablet (25  mg total) by mouth daily. 09/13/17  Yes Sudini, Wardell Heath, MD  Boris Lown Oil Omega-3 500 MG CAPS Take 1 tablet by mouth daily.   Yes [provider]  levothyroxine (SYNTHROID, LEVOTHROID) 200 MCG tablet Take 200 mcg by mouth daily before breakfast.   Yes [provider]  metoprolol tartrate (LOPRESSOR) 25 MG tablet Take 1 tablet (25 mg total) by mouth 2 (two) times daily. 07/27/16  Yes Katharina Caper, MD  Multiple Vitamin (MULTIVITAMIN WITH MINERALS) TABS tablet Take 1 tablet by mouth daily.   Yes [provider]  OXYGEN 4 L/min by Intratracheal route as needed (for shortness of breath).    Yes [provider]  SYMBICORT 160-4.5 MCG/ACT inhaler TAKE 2 PUFFS BY MOUTH TWICE A DAY 06/01/17  Yes Merwyn Katos, MD  budesonide (PULMICORT) 0.5 MG/2ML nebulizer solution Take 2 mLs (0.5 mg total) by nebulization 2 (two) times daily. 12/21/16   Merwyn Katos, MD  desonide (DESOWEN) 0.05 % cream Apply 1 application topically 2 (two) times daily.    [provider]  folic acid (FOLVITE) 1 MG tablet Take 1 mg by mouth daily.     [provider]   HYDROcodone-acetaminophen (NORCO/VICODIN) 5-325 MG tablet Take 1 tablet by mouth every 4 (four) hours as needed. 06/27/17   Shaune Pollack, MD  ipratropium-albuterol (DUONEB) 0.5-2.5 (3) MG/3ML SOLN Take 3 mLs by nebulization every 6 (six) hours as needed. Dx:J44.9 12/21/16   Merwyn Katos, MD  ketoconazole (NIZORAL) 2 % shampoo Apply 1 application topically daily as needed.    [provider]  sertraline (ZOLOFT) 100 MG tablet Take 1 tablet (100 mg total) by mouth daily. 06/27/17   Shaune Pollack, MD      PHYSICAL EXAMINATION:   VITAL SIGNS: Blood pressure (!) 140/115, pulse 80, temperature (!) 97.4 F (36.3 C), temperature source Oral, resp. rate 13, height 5\' 8"  (1.727 m), weight 103.4 kg (228 lb), SpO2 100 %.  GENERAL:  72 y.o.-year-old patient lying in the bed with no acute distress.  EYES: Pupils equal, round, reactive to light and accommodation. No scleral icterus. Extraocular muscles intact.  HEENT: Head atraumatic, normocephalic. Oropharynx and nasopharynx clear.  NECK:  Supple, no jugular venous distention. No thyroid enlargement, no tenderness.  LUNGS: Decreased breath sounds bilaterally, bilateral wheezing, rale.  On BiPAP IPAP 12 EPAP 6 Rate 8 FiO2 32% CARDIOVASCULAR: S1, S2 normal. No murmurs, rubs, or gallops.  ABDOMEN: Soft, nontender, nondistended. Bowel sounds present. No organomegaly or mass.  EXTREMITIES: No pedal edema, cyanosis, or clubbing.  NEUROLOGIC: Cranial nerves II through XII are intact. Muscle strength 5/5 in all extremities. Sensation intact. Gait not checked.  PSYCHIATRIC: The patient is alert and oriented x 3.  SKIN: No obvious rash, lesion, or ulcer.   LABORATORY PANEL:   CBC Recent Labs  Lab 10/04/17 1611  WBC 5.9  HGB 11.1*  HCT 32.3*  PLT 133*  MCV 93.7  MCH 32.2  MCHC 34.3  RDW 15.7*  LYMPHSABS 0.7*  MONOABS 0.7  EOSABS 0.1  BASOSABS 0.0    ------------------------------------------------------------------------------------------------------------------  Chemistries  Recent Labs  Lab 10/04/17 1611  NA 125*  K 4.2  CL 81*  CO2 39*  GLUCOSE 92  BUN 25*  CREATININE 0.90  CALCIUM 8.7*  AST 21  ALT 14*  ALKPHOS 60  BILITOT 1.5*   ------------------------------------------------------------------------------------------------------------------ estimated creatinine clearance is 89 mL/min (by C-G formula based on SCr of 0.9 mg/dL). ------------------------------------------------------------------------------------------------------------------ No results for input(s): TSH, T4TOTAL, T3FREE, THYROIDAB in the  last 72 hours.  Invalid input(s): FREET3   Coagulation profile No results for input(s): INR, PROTIME in the last 168 hours. ------------------------------------------------------------------------------------------------------------------- No results for input(s): DDIMER in the last 72 hours. -------------------------------------------------------------------------------------------------------------------  Cardiac Enzymes Recent Labs  Lab 10/04/17 1611  TROPONINI <0.03   ------------------------------------------------------------------------------------------------------------------ Invalid input(s): POCBNP  ---------------------------------------------------------------------------------------------------------------  Urinalysis    Component Value Date/Time   COLORURINE YELLOW (A) 09/09/2017 1032   APPEARANCEUR CLEAR (A) 09/09/2017 1032   APPEARANCEUR Clear 05/28/2014 1539   LABSPEC 1.013 09/09/2017 1032   LABSPEC 1.003 05/28/2014 1539   PHURINE 6.0 09/09/2017 1032   GLUCOSEU NEGATIVE 09/09/2017 1032   GLUCOSEU Negative 05/28/2014 1539   HGBUR NEGATIVE 09/09/2017 1032   BILIRUBINUR NEGATIVE 09/09/2017 1032   BILIRUBINUR Negative 05/28/2014 1539   KETONESUR NEGATIVE 09/09/2017 1032   PROTEINUR  NEGATIVE 09/09/2017 1032   UROBILINOGEN 0.2 08/05/2014 0031   NITRITE NEGATIVE 09/09/2017 1032   LEUKOCYTESUR MODERATE (A) 09/09/2017 1032   LEUKOCYTESUR 1+ 05/28/2014 1539     RADIOLOGY: Dg Chest 1 View  Result Date: 10/04/2017 CLINICAL DATA:  Pain following fall EXAM: CHEST  1 VIEW COMPARISON:  Chest radiograph September 09, 2017 and chest CT September 09, 2017 FINDINGS: Mild elevation of the right hemidiaphragm is stable. There is mild bibasilar atelectasis. There is no edema or consolidation. Heart is borderline enlarged with pulmonary vascularity within normal limits. No adenopathy. There is aortic atherosclerosis. Postoperative changes noted in the lower cervical and upper thoracic regions. There is advanced arthropathy in the left shoulder. 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 No pneumothorax. IMPRESSION: Mild bibasilar atelectasis. No edema or consolidation. Stable cardiac silhouette. There is aortic atherosclerosis. Aortic Atherosclerosis (ICD10-I70.0). Electronically Signed   By: Bretta Bang III M.D.   On: 10/04/2017 18:18   Dg Lumbar Spine Complete  Result Date: 10/04/2017 CLINICAL DATA:  Pain following fall EXAM: LUMBAR SPINE - COMPLETE 4+ VIEW COMPARISON:  June 13, 2017 FINDINGS: Frontal, lateral, spot lumbosacral lateral, and bilateral oblique views were obtained. The there are 5 non-rib-bearing lumbar type vertebral bodies. T12 ribs are hypoplastic. There is postoperative change with posterior screw and plate fixation at L4 and L5. There is a disc spacer at L4-5. There is no fracture. There is minimal retrolisthesis of L1 on L2 and minimal retrolisthesis of L2 on L3, stable. There is no new spondylolisthesis. There is disc space narrowing at L3-4 which has increased from prior study. Disc space narrowing at L4-5 and L5-S1 are stable. There is facet osteoarthritic change at L4-5 and L5-S1 bilaterally and at L3-4 on the left, similar to prior  studies. There is a stent in the aorta and iliac arteries. IMPRESSION: Postoperative change at L4 and L5. No fracture. Slight retrolisthesis of L1 on L2 and L2 on L3, stable, likely due to spondylosis. Slight increase in disc space narrowing at L3-4. Stable disc space narrowing at L4-5 and L5-S1. Stable facet osteoarthritic changes several levels. Stent in aorta and iliac arteries again noted. Electronically Signed   By: Bretta Bang III M.D.   On: 10/04/2017 18:17   Ct Head Wo Contrast  Result Date: 10/04/2017 CLINICAL DATA:  72 y/o  M; multiple falls with head trauma. EXAM: CT HEAD WITHOUT CONTRAST CT CERVICAL SPINE WITHOUT CONTRAST TECHNIQUE: Multidetector CT imaging of the head and cervical spine was performed following the standard protocol without intravenous contrast. Multiplanar CT image reconstructions of the cervical spine were also generated. COMPARISON:  09/26/2017 CT head and cervical spine. 09/26/2017 MRI head. 02/23/2017  MRI cervical spine. FINDINGS: CT HEAD FINDINGS Brain: No evidence of acute infarction, hemorrhage, hydrocephalus, extra-axial collection or mass lesion/mass effect. 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 Stable chronic microvascular ischemic changes and parenchymal volume loss of the brain. Vascular: Extensive calcific atherosclerosis of carotid siphons and vertebral arteries. Skull: Mild soft tissue contusion to the midline parietal scalp. No calvarial fracture. Sinuses/Orbits: Partial right mastoid effusion.  Otherwise negative. Other: None. CT CERVICAL SPINE FINDINGS Alignment: Straightening of cervical lordosis without listhesis. Grade 1 C2-3 anterolisthesis. Skull base and vertebrae: There is fusion of the right atlantooccipital joint and left atlantoaxial joint. C1-C6 laminectomy. Fusion of the posterior elements to the occipital bone rods and facet screws  hardware extending to the T2 level. No periprosthetic lucency or fracture identified. The facets are fused on the right from C2-C7 with persistent arthrosis at C7-T1. The facets on the left are fused from C2 to C5 with persistent arthrosis at C5-6 and below. Soft tissues and spinal canal: No prevertebral fluid or swelling. No visible canal hematoma. Disc levels: Uncovertebral and facet hypertrophy with bilateral foraminal stenosis at the C2 through C5 levels. Upper chest: Negative. Other: Negative. IMPRESSION: CT head: 1. Mild soft tissue contusion in the midline parietal scalp. No calvarial fracture. No acute intracranial abnormality. 2. Stable chronic microvascular ischemic changes and parenchymal volume loss of the brain. 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 CT cervical spine: 1. No acute fracture or dislocation. 2. Occipital to T2 posterior spinal fusion. No apparent hardware related complication. Stable advanced degenerative changes of the cervical spine. Electronically Signed   By: Mitzi Hansen M.D.   On: 10/04/2017 18:12   Ct Cervical Spine Wo Contrast  Result Date: 10/04/2017 CLINICAL DATA:  72 y/o  M; multiple falls with head trauma. EXAM: CT HEAD WITHOUT CONTRAST CT CERVICAL SPINE WITHOUT CONTRAST TECHNIQUE: Multidetector CT imaging of the head and cervical spine was performed following the standard protocol without intravenous contrast. Multiplanar CT image reconstructions of the cervical spine were also generated. COMPARISON:  09/26/2017 CT head and cervical spine. 09/26/2017 MRI head. 02/23/2017 MRI cervical spine. FINDINGS: CT HEAD FINDINGS Brain: No evidence of acute infarction, hemorrhage, hydrocephalus, extra-axial collection or mass lesion/mass effect. 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 Stable chronic microvascular ischemic  changes and parenchymal volume loss of the brain. Vascular: Extensive calcific atherosclerosis of carotid siphons and vertebral arteries. Skull: Mild soft tissue contusion to the midline parietal scalp. No calvarial fracture. Sinuses/Orbits: Partial right mastoid effusion.  Otherwise negative. Other: None. CT CERVICAL SPINE FINDINGS Alignment: Straightening of cervical lordosis without listhesis. Grade 1 C2-3 anterolisthesis. Skull base and vertebrae: There is fusion of the right atlantooccipital joint and left atlantoaxial joint. C1-C6 laminectomy. Fusion of the posterior elements to the occipital bone rods and facet screws hardware extending to the T2 level. No periprosthetic lucency or fracture identified. The facets are fused on the right from C2-C7 with persistent arthrosis at C7-T1. The facets on the left are fused from C2 to C5 with persistent arthrosis at C5-6 and below. Soft tissues and spinal canal: No prevertebral fluid or swelling. No visible canal hematoma. Disc levels: Uncovertebral and facet hypertrophy with bilateral foraminal stenosis at the C2 through C5 levels. Upper chest: Negative. Other: Negative. IMPRESSION: CT head: 1. Mild soft tissue contusion in the midline parietal scalp. No calvarial fracture. No  acute intracranial abnormality. 2. Stable chronic microvascular ischemic changes and parenchymal volume loss of the brain. 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 CT cervical spine: 1. No acute fracture or dislocation. 2. Occipital to T2 posterior spinal fusion. No apparent hardware related complication. Stable advanced degenerative changes of the cervical spine. Electronically Signed   By: Mitzi Hansen M.D.   On: 10/04/2017 18:12    EKG: Orders placed or performed during the hospital encounter of 10/04/17  . EKG 12-Lead  . EKG 12-Lead  . ED EKG 12-Lead  . ED EKG 12-Lead    IMPRESSION AND PLAN:  72 year old male patient with history of  COPD, congestive heart failure, degenerative joint disease, peripheral neuropathy, antalgic gait presented to the emergency room for fall and shortness of breath  1.  Acute hypercapnic respiratory failure Admit patient to stepdown unit BiPAP for respiratory distress Intensivist consultation  2.  Acute COPD exacerbation IV Solu-Medrol 60 mg every 6 hourly Nebulization treatments aggressively Levaquin antibiotic  3.  Antalgic gait Gait instability and frequent falls PT evaluation and gait and balance training  4.  Hyponatremia Hold diuretics  5.  DVT prophylaxis with Lovenox subcutaneously All the records are reviewed and case discussed with ED provider. Management plans discussed with the patient, family and they are in agreement.  CODE STATUS:Full Code    Code Status Orders  (From admission, onward)        Start     Ordered   10/04/17 1759  Do not attempt resuscitation (DNR)  Continuous    Question Answer Comment  In the event of cardiac or respiratory ARREST Do not call a "code blue"   In the event of cardiac or respiratory ARREST Do not perform Intubation, CPR, defibrillation or ACLS   In the event of cardiac or respiratory ARREST Use medication by any route, position, wound care, and other measures to relive pain and suffering. May use oxygen, suction and manual treatment of airway obstruction as needed for comfort.      10/04/17 1758    Code Status History    Date Active Date Inactive Code Status Order ID Comments User Context   09/13/2017 0657 09/13/2017 1547 DNR [id number]  Milagros Loll, MD Inpatient   09/09/2017 1333 09/13/2017 0657 Full Code [id number]  Ramonita Lab, MD ED   06/24/2017 2105 06/27/2017 1732 Full Code [id number]  Houston Siren, MD Inpatient   09/26/2016 0852 09/28/2016 2146 Full Code [id number]  Shaune Pollack, MD Inpatient   09/13/2016 2229 09/16/2016 1551 Full Code [id number]  Oralia Manis, MD ED   09/09/2016 0911 09/10/2016 1632 Full Code [id number]  Nada Libman, MD Inpatient   08/20/2016 0206 08/23/2016 2138 Full Code [id number]  Hugelmeyer, Alexis, DO Inpatient   07/16/2016 1312 07/27/2016 2018 Full Code [id number]  Erin Fulling, MD ED   03/09/2016 1720 03/15/2016 2206 Full Code [id number]  Heide Spark Inpatient   03/03/2016 1809 03/09/2016 1604 Full Code [id number]  Erin Fulling, MD ED   02/27/2016 2241 02/29/2016 1953 Full Code [id number]  Oralia Manis, MD Inpatient   07/22/2015 1930 07/26/2015 0016 Full Code [id number]  Adrian Saran, MD Inpatient   02/11/2015 1028 02/11/2015 1815 Full Code [id number]  Nada Libman, MD Inpatient   08/04/2014 2013 08/13/2014 1453 Full Code [id number]  Lynden Oxford, MD Inpatient       TOTAL CRITICAL CARE TIME TAKING CARE OF THIS PATIENT: 55 minutes.    Ihor Austin M.D on 10/04/2017 at 6:40 PM  Between  7am to 6pm - Pager - [phone]  After 6pm go to [email or web address] - password [email or web address] [facility]  [facility]  Office  [phone]  CC: Primary care physician; Chad Baseman, MD

## 2017-10-05 ENCOUNTER — Inpatient Hospital Stay: Payer: Medicare Other

## 2017-10-05 LAB — CBC
HCT: 31.2 % — ABNORMAL LOW (ref 40.0–52.0)
Hemoglobin: 10.6 g/dL — ABNORMAL LOW (ref 13.0–18.0)
MCH: 31.6 pg (ref 26.0–34.0)
MCHC: 33.8 g/dL (ref 32.0–36.0)
MCV: 93.5 fL (ref 80.0–100.0)
Platelets: 123 10*3/uL — ABNORMAL LOW (ref 150–440)
RBC: 3.34 MIL/uL — ABNORMAL LOW (ref 4.40–5.90)
RDW: 15.8 % — AB (ref 11.5–14.5)
WBC: 4.4 10*3/uL (ref 3.8–10.6)

## 2017-10-05 LAB — BASIC METABOLIC PANEL
Anion gap: 5 (ref 5–15)
BUN: 23 mg/dL — AB (ref 6–20)
CALCIUM: 8.6 mg/dL — AB (ref 8.9–10.3)
CO2: 39 mmol/L — AB (ref 22–32)
Chloride: 85 mmol/L — ABNORMAL LOW (ref 101–111)
Creatinine, Ser: 0.97 mg/dL (ref 0.61–1.24)
GFR calc Af Amer: >60 mL/min (ref >60)
Glucose, Bld: 83 mg/dL (ref 65–99)
Potassium: 4.2 mmol/L (ref 3.5–5.1)
Sodium: 129 mmol/L — ABNORMAL LOW (ref 135–145)

## 2017-10-05 MED ORDER — TRAZODONE HCL 50 MG PO TABS
50.0000 mg | ORAL_TABLET | Freq: Every evening | ORAL | Status: DC | PRN
  Administered 2017-10-06 – 2017-10-09 (×3): 50 mg via ORAL
  Filled 2017-10-05 (×5): qty 1

## 2017-10-05 MED ORDER — SODIUM CHLORIDE 1 G PO TABS
1.0000 g | ORAL_TABLET | Freq: Three times a day (TID) | ORAL | Status: DC
  Administered 2017-10-05 – 2017-10-06 (×2): 1 g via ORAL
  Filled 2017-10-05 (×4): qty 1

## 2017-10-05 MED ORDER — SODIUM CHLORIDE 0.9% FLUSH: 10.0000 mL | INTRAVENOUS | Status: DC | PRN

## 2017-10-05 NOTE — Progress Notes (Signed)
Patient ID: Chad Huffman, male   DOB: 1945-12-26, 72 y.o.   MRN: [id number]  [employer] PROGRESS NOTE  Chad Huffman WUJ:[id number] DOB: 02/09/1946 DOA: 10/04/2017 PCP: Dorothey Baseman, MD  HPI/Subjective: Patient stated he had 2 falls and fell backwards and hit the back of his head.  When he came into the hospital his CO2 was very high and was admitted to the CCU for BiPAP.  He feels okay.  He states that he does drink a lot of [employer] at home.  He states he eats okay.  He did have some constipation and took some medications then had diarrhea over the last couple days.  Objective: Vitals:   10/05/17 0743 10/05/17 1316  BP:    Pulse:    Resp:    Temp:    SpO2: 90% 90%    Filed Weights   10/04/17 1524  Weight: 103.4 kg (228 lb)    ROS: Review of Systems  Constitutional: Positive for malaise/fatigue. Negative for chills and fever.  Eyes: Negative for blurred vision.  Respiratory: Positive for cough and shortness of breath.   Cardiovascular: Negative for chest pain.  Gastrointestinal: Positive for diarrhea. Negative for abdominal pain, constipation, nausea and vomiting.  Genitourinary: Negative for dysuria.  Musculoskeletal: Negative for joint pain.  Neurological: Negative for dizziness and headaches.   Exam: Physical Exam  Constitutional: He is oriented to person, place, and time.  HENT:  Nose: No mucosal edema.  Mouth/Throat: No oropharyngeal exudate or posterior oropharyngeal edema.  Eyes: Pupils are equal, round, and reactive to light. Conjunctivae, EOM and lids are normal.  Neck: No JVD present. Carotid bruit is not present. No edema present. No thyroid mass and no thyromegaly present.  Cardiovascular: S1 normal and S2 normal. Exam reveals no gallop.  No murmur heard. Pulses:      Dorsalis pedis pulses are 2+ on the right side, and 2+ on the left side.  Respiratory: No respiratory distress. He has decreased breath sounds in the right lower field and the left  lower field. He has no wheezes. He has no rhonchi. He has no rales.  GI: Soft. Bowel sounds are normal. There is no tenderness.  Musculoskeletal:       Right ankle: He exhibits no swelling.       Left ankle: He exhibits no swelling.  Lymphadenopathy:    He has no cervical adenopathy.  Neurological: He is alert and oriented to person, place, and time. No cranial nerve deficit.  Skin: Skin is warm. No rash noted. Nails show no clubbing.  Psychiatric: He has a normal mood and affect.      Data Reviewed: Basic Metabolic Panel: Recent Labs  Lab 10/04/17 1611 10/05/17 0457  NA 125* 129*  K 4.2 4.2  CL 81* 85*  CO2 39* 39*  GLUCOSE 92 83  BUN 25* 23*  CREATININE 0.89  0.90 0.97  CALCIUM 8.7* 8.6*   Liver Function Tests: Recent Labs  Lab 10/04/17 1611  AST 21  ALT 14*  ALKPHOS 60  BILITOT 1.5*  PROT 6.7  ALBUMIN 3.7   CBC: Recent Labs  Lab 10/04/17 1611 10/05/17 0457  WBC 6.4  5.9 4.4  NEUTROABS 4.4  --   HGB 11.1*  11.1* 10.6*  HCT 33.1*  32.3* 31.2*  MCV 93.7  93.7 93.5  PLT 125*  133* 123*   Cardiac Enzymes: Recent Labs  Lab 10/04/17 1611  TROPONINI <0.03   BNP (last 3 results) Recent Labs  06/24/17 1530 10/04/17 1611  BNP 228.0* 221.0*     CBG: Recent Labs  Lab 10/04/17 1958  GLUCAP 80    Recent Results (from the past 240 hour(s))  Blood Culture (routine x 2)     Status: None (Preliminary result)   Collection Time: 10/04/17  4:11 PM  Result Value Ref Range Status   Specimen Description BLOOD RIGHT ANTECUBITAL  Final   Special Requests   Final    BOTTLES DRAWN AEROBIC AND ANAEROBIC Blood Culture adequate volume   Culture   Final    NO GROWTH < 24 HOURS Performed at [facility], [address] [place], [place] [address]    Report Status PENDING  Incomplete  Blood Culture (routine x 2)     Status: None (Preliminary result)   Collection Time: 10/04/17  4:36 PM  Result Value Ref Range Status   Specimen Description  BLOOD BLOOD RIGHT FOREARM  Final   Special Requests   Final    BOTTLES DRAWN AEROBIC AND ANAEROBIC Blood Culture adequate volume   Culture   Final    NO GROWTH < 24 HOURS Performed at [facility], [address] [place], [place] [address]    Report Status PENDING  Incomplete  MRSA PCR Screening     Status: None   Collection Time: 10/04/17  8:02 PM  Result Value Ref Range Status   MRSA by PCR NEGATIVE NEGATIVE Final    Comment:        The GeneXpert MRSA Assay (FDA approved for NASAL specimens only), is one component of a comprehensive MRSA colonization surveillance program. It is not intended to diagnose MRSA infection nor to guide or monitor treatment for MRSA infections. Performed at [facility], [address] [place], [place] [address]      Studies: Dg Chest 1 View  Result Date: 10/04/2017 CLINICAL DATA:  Pain following fall EXAM: CHEST  1 VIEW COMPARISON:  Chest radiograph September 09, 2017 and chest CT September 09, 2017 FINDINGS: Mild elevation of the right hemidiaphragm is stable. There is mild bibasilar atelectasis. There is no edema or consolidation. Heart is borderline enlarged with pulmonary vascularity within normal limits. No adenopathy. There is aortic atherosclerosis. Postoperative changes noted in the lower cervical and upper thoracic regions. There is advanced arthropathy in the left shoulder. 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 No pneumothorax. IMPRESSION: Mild bibasilar atelectasis. No edema or consolidation. Stable cardiac silhouette. There is aortic atherosclerosis. Aortic Atherosclerosis (ICD10-I70.0). Electronically Signed   By: Bretta Bang III M.D.   On: 10/04/2017 18:18   Dg Lumbar Spine Complete  Result Date: 10/04/2017 CLINICAL DATA:  Pain following fall EXAM: LUMBAR SPINE - COMPLETE 4+ VIEW COMPARISON:  June 13, 2017 FINDINGS: Frontal, lateral, spot lumbosacral lateral, and bilateral oblique  views were obtained. The there are 5 non-rib-bearing lumbar type vertebral bodies. T12 ribs are hypoplastic. There is postoperative change with posterior screw and plate fixation at L4 and L5. There is a disc spacer at L4-5. There is no fracture. There is minimal retrolisthesis of L1 on L2 and minimal retrolisthesis of L2 on L3, stable. There is no new spondylolisthesis. There is disc space narrowing at L3-4 which has increased from prior study. Disc space narrowing at L4-5 and L5-S1 are stable. There is facet osteoarthritic change at L4-5 and L5-S1 bilaterally and at L3-4 on the left, similar to prior studies. There is a stent in the aorta and iliac arteries. IMPRESSION: Postoperative change at  L4 and L5. No fracture. Slight retrolisthesis of L1 on L2 and L2 on L3, stable, likely due to spondylosis. Slight increase in disc space narrowing at L3-4. Stable disc space narrowing at L4-5 and L5-S1. Stable facet osteoarthritic changes several levels. Stent in aorta and iliac arteries again noted. Electronically Signed   By: Bretta Bang III M.D.   On: 10/04/2017 18:17   Ct Head Wo Contrast  Result Date: 10/04/2017 CLINICAL DATA:  72 y/o  M; multiple falls with head trauma. EXAM: CT HEAD WITHOUT CONTRAST CT CERVICAL SPINE WITHOUT CONTRAST TECHNIQUE: Multidetector CT imaging of the head and cervical spine was performed following the standard protocol without intravenous contrast. Multiplanar CT image reconstructions of the cervical spine were also generated. COMPARISON:  09/26/2017 CT head and cervical spine. 09/26/2017 MRI head. 02/23/2017 MRI cervical spine. FINDINGS: CT HEAD FINDINGS Brain: No evidence of acute infarction, hemorrhage, hydrocephalus, extra-axial collection or mass lesion/mass effect. 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 Stable chronic microvascular ischemic changes and  parenchymal volume loss of the brain. Vascular: Extensive calcific atherosclerosis of carotid siphons and vertebral arteries. Skull: Mild soft tissue contusion to the midline parietal scalp. No calvarial fracture. Sinuses/Orbits: Partial right mastoid effusion.  Otherwise negative. Other: None. CT CERVICAL SPINE FINDINGS Alignment: Straightening of cervical lordosis without listhesis. Grade 1 C2-3 anterolisthesis. Skull base and vertebrae: There is fusion of the right atlantooccipital joint and left atlantoaxial joint. C1-C6 laminectomy. Fusion of the posterior elements to the occipital bone rods and facet screws hardware extending to the T2 level. No periprosthetic lucency or fracture identified. The facets are fused on the right from C2-C7 with persistent arthrosis at C7-T1. The facets on the left are fused from C2 to C5 with persistent arthrosis at C5-6 and below. Soft tissues and spinal canal: No prevertebral fluid or swelling. No visible canal hematoma. Disc levels: Uncovertebral and facet hypertrophy with bilateral foraminal stenosis at the C2 through C5 levels. Upper chest: Negative. Other: Negative. IMPRESSION: CT head: 1. Mild soft tissue contusion in the midline parietal scalp. No calvarial fracture. No acute intracranial abnormality. 2. Stable chronic microvascular ischemic changes and parenchymal volume loss of the brain. 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 CT cervical spine: 1. No acute fracture or dislocation. 2. Occipital to T2 posterior spinal fusion. No apparent hardware related complication. Stable advanced degenerative changes of the cervical spine. Electronically Signed   By: Mitzi Hansen M.D.   On: 10/04/2017 18:12   Ct Cervical Spine Wo Contrast  Result Date: 10/04/2017 CLINICAL DATA:  72 y/o  M; multiple falls with head trauma. EXAM: CT HEAD WITHOUT CONTRAST CT CERVICAL SPINE WITHOUT CONTRAST TECHNIQUE: Multidetector CT imaging of the head  and cervical spine was performed following the standard protocol without intravenous contrast. Multiplanar CT image reconstructions of the cervical spine were also generated. COMPARISON:  09/26/2017 CT head and cervical spine. 09/26/2017 MRI head. 02/23/2017 MRI cervical spine. FINDINGS: CT HEAD FINDINGS Brain: No evidence of acute infarction, hemorrhage, hydrocephalus, extra-axial collection or mass lesion/mass effect. 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 Stable chronic microvascular ischemic changes and parenchymal volume loss of the brain. Vascular: Extensive calcific atherosclerosis of carotid siphons and vertebral arteries. Skull: Mild soft tissue contusion to the midline parietal scalp. No calvarial fracture. Sinuses/Orbits: Partial right mastoid effusion.  Otherwise negative. Other: None. CT CERVICAL SPINE  FINDINGS Alignment: Straightening of cervical lordosis without listhesis. Grade 1 C2-3 anterolisthesis. Skull base and vertebrae: There is fusion of the right atlantooccipital joint and left atlantoaxial joint. C1-C6 laminectomy. Fusion of the posterior elements to the occipital bone rods and facet screws hardware extending to the T2 level. No periprosthetic lucency or fracture identified. The facets are fused on the right from C2-C7 with persistent arthrosis at C7-T1. The facets on the left are fused from C2 to C5 with persistent arthrosis at C5-6 and below. Soft tissues and spinal canal: No prevertebral fluid or swelling. No visible canal hematoma. Disc levels: Uncovertebral and facet hypertrophy with bilateral foraminal stenosis at the C2 through C5 levels. Upper chest: Negative. Other: Negative. IMPRESSION: CT head: 1. Mild soft tissue contusion in the midline parietal scalp. No calvarial fracture. No acute intracranial abnormality. 2. Stable chronic microvascular ischemic changes and  parenchymal volume loss of the brain. 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 CT cervical spine: 1. No acute fracture or dislocation. 2. Occipital to T2 posterior spinal fusion. No apparent hardware related complication. Stable advanced degenerative changes of the cervical spine. Electronically Signed   By: Mitzi HansenLance  Furusawa-Stratton M.D.   On: 10/04/2017 18:12   [place] Venous Img Lower Bilateral  Result Date: 10/05/2017 CLINICAL DATA:  Bilateral lower extremity swelling EXAM: BILATERAL LOWER EXTREMITY VENOUS DOPPLER ULTRASOUND TECHNIQUE: Gray-scale sonography with graded compression, as well as color Doppler and duplex ultrasound were performed to evaluate the lower extremity deep venous systems from the level of the common femoral vein and including the common femoral, femoral, profunda femoral, popliteal and calf veins including the posterior tibial, peroneal and gastrocnemius veins when visible. The superficial great saphenous vein was also interrogated. Spectral Doppler was utilized to evaluate flow at rest and with distal augmentation maneuvers in the common femoral, femoral and popliteal veins. COMPARISON:  09/09/2017 FINDINGS: RIGHT LOWER EXTREMITY Common Femoral Vein: No evidence of thrombus. Normal compressibility, respiratory phasicity and response to augmentation. Saphenofemoral Junction: No evidence of thrombus. Normal compressibility and flow on color Doppler imaging. Profunda Femoral Vein: No evidence of thrombus. Normal compressibility and flow on color Doppler imaging. Femoral Vein: No evidence of thrombus. Normal compressibility, respiratory phasicity and response to augmentation. Popliteal Vein: No evidence of thrombus. Normal compressibility, respiratory phasicity and response to augmentation. Calf Veins: No evidence of thrombus. Normal compressibility and flow on color Doppler imaging. Superficial Great Saphenous Vein: No evidence of thrombus. Normal  compressibility. Venous Reflux:  None. Other Findings:  None. LEFT LOWER EXTREMITY Common Femoral Vein: No evidence of thrombus. Normal compressibility, respiratory phasicity and response to augmentation. Saphenofemoral Junction: No evidence of thrombus. Normal compressibility and flow on color Doppler imaging. Profunda Femoral Vein: No evidence of thrombus. Normal compressibility and flow on color Doppler imaging. Femoral Vein: No evidence of thrombus. Normal compressibility, respiratory phasicity and response to augmentation. Popliteal Vein: No evidence of thrombus. Normal compressibility, respiratory phasicity and response to augmentation. Calf Veins: No evidence of thrombus. Normal compressibility and flow on color Doppler imaging. Superficial Great Saphenous Vein: No evidence of thrombus. Normal compressibility. Venous Reflux:  None. Other Findings:  None. IMPRESSION: No evidence of deep venous thrombosis. Electronically Signed   By: Alcide CleverMark  Lukens M.D.   On: 10/05/2017 09:13    Scheduled Meds: . albuterol  5 mg Nebulization Once  . amLODipine  5 mg Oral Daily  . aspirin EC  81 mg Oral Daily  . clopidogrel  75 mg Oral Daily  . enoxaparin (LOVENOX) injection  40 mg Subcutaneous Q24H  . folic acid  1 mg Oral Daily  . furosemide  40 mg Intravenous Once  . ipratropium-albuterol  3 mL Nebulization Q6H  . levothyroxine  200 mcg Oral QAC breakfast  . methylPREDNISolone (SOLU-MEDROL) injection  125 mg Intravenous Once  . methylPREDNISolone (SOLU-MEDROL) injection  40 mg Intravenous Q12H  . multivitamin with minerals  1 tablet Oral Daily  . sertraline  100 mg Oral Daily  . sodium chloride flush  3 mL Intravenous Q12H  . sodium chloride  1 g Oral TID WC   Continuous Infusions: . sodium chloride    . ceFEPime (MAXIPIME) IV Stopped (10/05/17 1126)    Assessment/Plan:  1. Acute hypercapnic respiratory failure.  Patient initially required BiPAP to blow off CO2.  Patient down to nasal cannula at this  point.   2. COPD exacerbation.  Solu-Medrol decreased to 40 mg every 12 hours. 3. Hyponatremia, likely SIADH.  Responded last hospitalization to salt tablets and fluid restriction.  Advised to stop drinking [facility]. 4. Weakness and falls.  Physical therapy evaluation.  Goal is to go home with home health   5. Essential hypertension on Norvasc 6. Depression on Zoloft 7. Hypothyroidism unspecified on levothyroxine 8. History of diastolic congestive heart failure.  No signs at this time.  Code Status:     Code Status Orders  (From admission, onward)        Start     Ordered   10/04/17 2026  Do not attempt resuscitation (DNR)  Continuous    Question Answer Comment  In the event of cardiac or respiratory ARREST Do not call a "code blue"   In the event of cardiac or respiratory ARREST Do not perform Intubation, CPR, defibrillation or ACLS   In the event of cardiac or respiratory ARREST Use medication by any route, position, wound care, and other measures to relive pain and suffering. May use oxygen, suction and manual treatment of airway obstruction as needed for comfort.      10/04/17 2025    Code Status History    Date Active Date Inactive Code Status Order ID Comments User Context   10/04/2017 1758 10/04/2017 2025 DNR [id number]  Ihor Austin, MD ED   09/13/2017 0657 09/13/2017 1547 DNR [id number]  Milagros Loll, MD Inpatient   09/09/2017 1333 09/13/2017 0657 Full Code [id number]  Ramonita Lab, MD ED   06/24/2017 2105 06/27/2017 1732 Full Code [id number]  Houston Siren, MD Inpatient   09/26/2016 0852 09/28/2016 2146 Full Code [id number]  Shaune Pollack, MD Inpatient   09/13/2016 2229 09/16/2016 1551 Full Code [id number]  Oralia Manis, MD ED   09/09/2016 0911 09/10/2016 1632 Full Code [id number]  Nada Libman, MD Inpatient   08/20/2016 0206 08/23/2016 2138 Full Code [id number]  Hugelmeyer, Alexis, DO Inpatient   07/16/2016 1312 07/27/2016 2018 Full Code [id number]  Erin Fulling, MD ED   03/09/2016 1720  03/15/2016 2206 Full Code [id number]  Heide Spark Inpatient   03/03/2016 1809 03/09/2016 1604 Full Code [id number]  Erin Fulling, MD ED   02/27/2016 2241 02/29/2016 1953 Full Code [id number]  Oralia Manis, MD Inpatient   07/22/2015 1930 07/26/2015 0016 Full Code [id number]  Adrian Saran, MD Inpatient   02/11/2015 1028 02/11/2015 1815 Full Code [id number]  Nada Libman, MD Inpatient   08/04/2014 2013 08/13/2014 1453 Full Code [id number]  Lynden Oxford, MD Inpatient    Advance Directive Documentation     Most Recent Value  Type of Advance Directive  Healthcare Power  of Attorney, Living will  Pre-existing out of facility DNR order (yellow form or pink MOST form)  -  "MOST" Form in Place?  -     Disposition Plan: Hopefully home with home health.  Time spent: 25 minutes  Shainna Faux [employer]

## 2017-10-06 LAB — BASIC METABOLIC PANEL
ANION GAP: 9 (ref 5–15)
BUN: 35 mg/dL — ABNORMAL HIGH (ref 6–20)
CHLORIDE: 83 mmol/L — AB (ref 101–111)
CO2: 36 mmol/L — AB (ref 22–32)
Calcium: 8.3 mg/dL — ABNORMAL LOW (ref 8.9–10.3)
Creatinine, Ser: 1.12 mg/dL (ref 0.61–1.24)
GFR calc Af Amer: >60 mL/min (ref >60)
GLUCOSE: 179 mg/dL — AB (ref 65–99)
POTASSIUM: 4.1 mmol/L (ref 3.5–5.1)
Sodium: 128 mmol/L — ABNORMAL LOW (ref 135–145)

## 2017-10-06 LAB — BLOOD GAS, VENOUS
ACID-BASE EXCESS: 17.4 mmol/L — AB (ref 0.0–2.0)
BICARBONATE: 47.5 mmol/L — AB (ref 20.0–28.0)
O2 Saturation: 67.7 %
PO2 VEN: 38 mmHg (ref 32.0–45.0)
Patient temperature: 37
pH, Ven: 7.33 (ref 7.250–7.430)

## 2017-10-06 MED ORDER — SODIUM CHLORIDE 0.9 % IV SOLN
INTRAVENOUS | Status: DC
  Administered 2017-10-06 – 2017-10-07 (×2): via INTRAVENOUS

## 2017-10-06 NOTE — Care Management Note (Signed)
Case Management Note  Patient Details  Name: Chad Huffman MRN: [id number] Date of Birth: Jun 20, 1946  Subjective/Objective:  Admitted to [facility] with the diagnosis of respiratory failure. Lives alone. Brother is IdIllene Labradora Roguevincent Milewski [phone]). Sees Dr, Terance HartBronstein as primary care physician.[facility] 09/28/17.  Home Health per [facility] and [facility] in the past. [facility], [facility], [facility]. Home oxygen per [facility] since 08/2016. Uses 2 liters per nasal cannula continuous. Rolling walker in the home. Takes care of all basic activities of daily living himself, doesn't drive. Friends/family help with errands.                   Action/Plan: Physical therapy evaluation completed. Recommending home with Home Health and therapy.  Would like [facility]. Will update Jermaine, [facility] representative.   Expected Discharge Date:                  Expected Discharge Plan:     In-House Referral:     Discharge planning Services   yes  Post Acute Care Choice:   yes Choice offered to:   patient  DME Arranged:    DME Agency:     HH Arranged:   yes HH Agency:   [facility]  Status of Service:     If discussed at Long Length of Stay Meetings, dates discussed:    Additional Comments:  Gwenette GreetBrenda S Tico Crotteau, RN MSN CCM Care Management [phone] 10/06/2017, 2:28 PM

## 2017-10-06 NOTE — Progress Notes (Signed)
Patient ID: Chad Huffman, male   DOB: [birth year], 72 y.o.   MRN: [id number]  [facility] PROGRESS NOTE  ASHAN CUEVA NWG:[id number] DOB: 1946-02-14 DOA: 10/04/2017 PCP: Dorothey Baseman, MD  HPI/Subjective: Patient feeling a little bit better today.  States his breathing is not back to his usual breathing yet.  Did have some diarrhea.  Still feels weak.  Objective: Vitals:   10/06/17 0726 10/06/17 0744  BP: (!) 154/85   Pulse: 93   Resp: 20   Temp: 98.6 F (37 C)   SpO2: 91% 90%    Filed Weights   10/04/17 1524 10/06/17 0612  Weight: 103.4 kg (228 lb) 99.7 kg (219 lb 12.8 oz)    ROS: Review of Systems  Constitutional: Positive for malaise/fatigue. Negative for chills and fever.  Eyes: Negative for blurred vision.  Respiratory: Positive for cough and shortness of breath.   Cardiovascular: Negative for chest pain.  Gastrointestinal: Positive for diarrhea. Negative for abdominal pain, constipation, nausea and vomiting.  Genitourinary: Negative for dysuria.  Musculoskeletal: Negative for joint pain.  Neurological: Negative for dizziness and headaches.   Exam: Physical Exam  Constitutional: He is oriented to person, place, and time.  HENT:  Nose: No mucosal edema.  Mouth/Throat: No oropharyngeal exudate or posterior oropharyngeal edema.  Eyes: Pupils are equal, round, and reactive to light. Conjunctivae, EOM and lids are normal.  Neck: No JVD present. Carotid bruit is not present. No edema present. No thyroid mass and no thyromegaly present.  Cardiovascular: S1 normal and S2 normal. Exam reveals no gallop.  No murmur heard. Pulses:      Dorsalis pedis pulses are 2+ on the right side, and 2+ on the left side.  Respiratory: No respiratory distress. He has decreased breath sounds in the right lower field and the left lower field. He has no wheezes. He has no rhonchi. He has no rales.  GI: Soft. Bowel sounds are normal. There is no tenderness.  Musculoskeletal:        Right ankle: He exhibits no swelling.       Left ankle: He exhibits no swelling.  Lymphadenopathy:    He has no cervical adenopathy.  Neurological: He is alert and oriented to person, place, and time. No cranial nerve deficit.  Skin: Skin is warm. No rash noted. Nails show no clubbing.  Psychiatric: He has a normal mood and affect.      Data Reviewed: Basic Metabolic Panel: Recent Labs  Lab 10/04/17 1611 10/05/17 0457 10/06/17 0402  NA 125* 129* 128*  K 4.2 4.2 4.1  CL 81* 85* 83*  CO2 39* 39* 36*  GLUCOSE 92 83 179*  BUN 25* 23* 35*  CREATININE 0.89  0.90 0.97 1.12  CALCIUM 8.7* 8.6* 8.3*   Liver Function Tests: Recent Labs  Lab 10/04/17 1611  AST 21  ALT 14*  ALKPHOS 60  BILITOT 1.5*  PROT 6.7  ALBUMIN 3.7   CBC: Recent Labs  Lab 10/04/17 1611 10/05/17 0457  WBC 6.4  5.9 4.4  NEUTROABS 4.4  --   HGB 11.1*  11.1* 10.6*  HCT 33.1*  32.3* 31.2*  MCV 93.7  93.7 93.5  PLT 125*  133* 123*   Cardiac Enzymes: Recent Labs  Lab 10/04/17 1611  TROPONINI <0.03   BNP (last 3 results) Recent Labs    06/24/17 1530 10/04/17 1611  BNP 228.0* 221.0*     CBG: Recent Labs  Lab 10/04/17 1958  GLUCAP 80    Recent Results (from  the past 240 hour(s))  Blood Culture (routine x 2)     Status: None (Preliminary result)   Collection Time: 10/04/17  4:11 PM  Result Value Ref Range Status   Specimen Description BLOOD RIGHT ANTECUBITAL  Final   Special Requests   Final    BOTTLES DRAWN AEROBIC AND ANAEROBIC Blood Culture adequate volume   Culture   Final    NO GROWTH 2 DAYS Performed at [facility], [address] [place], [place] [address]    Report Status PENDING  Incomplete  Blood Culture (routine x 2)     Status: None (Preliminary result)   Collection Time: 10/04/17  4:36 PM  Result Value Ref Range Status   Specimen Description BLOOD BLOOD RIGHT FOREARM  Final   Special Requests   Final    BOTTLES DRAWN AEROBIC AND ANAEROBIC Blood  Culture adequate volume   Culture   Final    NO GROWTH 2 DAYS Performed at [facility], [address], [place], [place] [address]    Report Status PENDING  Incomplete  MRSA PCR Screening     Status: None   Collection Time: 10/04/17  8:02 PM  Result Value Ref Range Status   MRSA by PCR NEGATIVE NEGATIVE Final    Comment:        The GeneXpert MRSA Assay (FDA approved for NASAL specimens only), is one component of a comprehensive MRSA colonization surveillance program. It is not intended to diagnose MRSA infection nor to guide or monitor treatment for MRSA infections. Performed at [facility], [address], [place], [place] [address]      Studies: Dg Chest 1 View  Result Date: 10/04/2017 CLINICAL DATA:  Pain following fall EXAM: CHEST  1 VIEW COMPARISON:  Chest radiograph September 09, 2017 and chest CT September 09, 2017 FINDINGS: Mild elevation of the right hemidiaphragm is stable. There is mild bibasilar atelectasis. There is no edema or consolidation. Heart is borderline enlarged with pulmonary vascularity within normal limits. No adenopathy. There is aortic atherosclerosis. Postoperative changes noted in the lower cervical and upper thoracic regions. There is advanced arthropathy in the left shoulder. 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 No pneumothorax. IMPRESSION: Mild bibasilar atelectasis. No edema or consolidation. Stable cardiac silhouette. There is aortic atherosclerosis. Aortic Atherosclerosis (ICD10-I70.0). Electronically Signed   By: Bretta Bang III M.D.   On: 10/04/2017 18:18   Dg Lumbar Spine Complete  Result Date: 10/04/2017 CLINICAL DATA:  Pain following fall EXAM: LUMBAR SPINE - COMPLETE 4+ VIEW COMPARISON:  June 13, 2017 FINDINGS: Frontal, lateral, spot lumbosacral lateral, and bilateral oblique views were obtained. The there are 5 non-rib-bearing lumbar type vertebral bodies. T12 ribs are hypoplastic.  There is postoperative change with posterior screw and plate fixation at L4 and L5. There is a disc spacer at L4-5. There is no fracture. There is minimal retrolisthesis of L1 on L2 and minimal retrolisthesis of L2 on L3, stable. There is no new spondylolisthesis. There is disc space narrowing at L3-4 which has increased from prior study. Disc space narrowing at L4-5 and L5-S1 are stable. There is facet osteoarthritic change at L4-5 and L5-S1 bilaterally and at L3-4 on the left, similar to prior studies. There is a stent in the aorta and iliac arteries. IMPRESSION: Postoperative change at L4 and L5. No fracture. Slight retrolisthesis of L1 on L2 and L2 on L3, stable, likely due to spondylosis. Slight increase in disc space narrowing at L3-4. Stable disc  space narrowing at L4-5 and L5-S1. Stable facet osteoarthritic changes several levels. Stent in aorta and iliac arteries again noted. Electronically Signed   By: Bretta Bang III M.D.   On: 10/04/2017 18:17   Ct Head Wo Contrast  Result Date: 10/04/2017 CLINICAL DATA:  72 y/o  M; multiple falls with head trauma. EXAM: CT HEAD WITHOUT CONTRAST CT CERVICAL SPINE WITHOUT CONTRAST TECHNIQUE: Multidetector CT imaging of the head and cervical spine was performed following the standard protocol without intravenous contrast. Multiplanar CT image reconstructions of the cervical spine were also generated. COMPARISON:  09/26/2017 CT head and cervical spine. 09/26/2017 MRI head. 02/23/2017 MRI cervical spine. FINDINGS: CT HEAD FINDINGS Brain: No evidence of acute infarction, hemorrhage, hydrocephalus, extra-axial collection or mass lesion/mass effect. 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 Stable chronic microvascular ischemic changes and parenchymal volume loss of the brain. Vascular: Extensive calcific atherosclerosis of carotid siphons and vertebral  arteries. Skull: Mild soft tissue contusion to the midline parietal scalp. No calvarial fracture. Sinuses/Orbits: Partial right mastoid effusion.  Otherwise negative. Other: None. CT CERVICAL SPINE FINDINGS Alignment: Straightening of cervical lordosis without listhesis. Grade 1 C2-3 anterolisthesis. Skull base and vertebrae: There is fusion of the right atlantooccipital joint and left atlantoaxial joint. C1-C6 laminectomy. Fusion of the posterior elements to the occipital bone rods and facet screws hardware extending to the T2 level. No periprosthetic lucency or fracture identified. The facets are fused on the right from C2-C7 with persistent arthrosis at C7-T1. The facets on the left are fused from C2 to C5 with persistent arthrosis at C5-6 and below. Soft tissues and spinal canal: No prevertebral fluid or swelling. No visible canal hematoma. Disc levels: Uncovertebral and facet hypertrophy with bilateral foraminal stenosis at the C2 through C5 levels. Upper chest: Negative. Other: Negative. IMPRESSION: CT head: 1. Mild soft tissue contusion in the midline parietal scalp. No calvarial fracture. No acute intracranial abnormality. 2. Stable chronic microvascular ischemic changes and parenchymal volume loss of the brain. 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 CT cervical spine: 1. No acute fracture or dislocation. 2. Occipital to T2 posterior spinal fusion. No apparent hardware related complication. Stable advanced degenerative changes of the cervical spine. Electronically Signed   By: Mitzi Hansen M.D.   On: 10/04/2017 18:12   Ct Cervical Spine Wo Contrast  Result Date: 10/04/2017 CLINICAL DATA:  72 y/o  M; multiple falls with head trauma. EXAM: CT HEAD WITHOUT CONTRAST CT CERVICAL SPINE WITHOUT CONTRAST TECHNIQUE: Multidetector CT imaging of the head and cervical spine was performed following the standard protocol without intravenous contrast. Multiplanar CT image  reconstructions of the cervical spine were also generated. COMPARISON:  09/26/2017 CT head and cervical spine. 09/26/2017 MRI head. 02/23/2017 MRI cervical spine. FINDINGS: CT HEAD FINDINGS Brain: No evidence of acute infarction, hemorrhage, hydrocephalus, extra-axial collection or mass lesion/mass effect. 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 Stable chronic microvascular ischemic changes and parenchymal volume loss of the brain. Vascular: Extensive calcific atherosclerosis of carotid siphons and vertebral arteries. Skull: Mild soft tissue contusion to the midline parietal scalp. No calvarial fracture. Sinuses/Orbits: Partial right mastoid effusion.  Otherwise negative. Other: None. CT CERVICAL SPINE FINDINGS Alignment: Straightening of cervical lordosis without listhesis. Grade 1 C2-3 anterolisthesis. Skull base and vertebrae: There is fusion of the right atlantooccipital joint and left atlantoaxial joint. C1-C6 laminectomy.  Fusion of the posterior elements to the occipital bone rods and facet screws hardware extending to the T2 level. No periprosthetic lucency or fracture identified. The facets are fused on the right from C2-C7 with persistent arthrosis at C7-T1. The facets on the left are fused from C2 to C5 with persistent arthrosis at C5-6 and below. Soft tissues and spinal canal: No prevertebral fluid or swelling. No visible canal hematoma. Disc levels: Uncovertebral and facet hypertrophy with bilateral foraminal stenosis at the C2 through C5 levels. Upper chest: Negative. Other: Negative. IMPRESSION: CT head: 1. Mild soft tissue contusion in the midline parietal scalp. No calvarial fracture. No acute intracranial abnormality. 2. Stable chronic microvascular ischemic changes and parenchymal volume loss of the brain. 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 CT  cervical spine: 1. No acute fracture or dislocation. 2. Occipital to T2 posterior spinal fusion. No apparent hardware related complication. Stable advanced degenerative changes of the cervical spine. Electronically Signed   By: Mitzi HansenLance  Furusawa-[place]ton M.D.   On: 10/04/2017 18:12   [place] Venous Img Lower Bilateral  Result Date: 10/05/2017 CLINICAL DATA:  Bilateral lower extremity swelling EXAM: BILATERAL LOWER EXTREMITY VENOUS DOPPLER ULTRASOUND TECHNIQUE: Gray-scale sonography with graded compression, as well as color Doppler and duplex ultrasound were performed to evaluate the lower extremity deep venous systems from the level of the common femoral vein and including the common femoral, femoral, profunda femoral, popliteal and calf veins including the posterior tibial, peroneal and gastrocnemius veins when visible. The superficial great saphenous vein was also interrogated. Spectral Doppler was utilized to evaluate flow at rest and with distal augmentation maneuvers in the common femoral, femoral and popliteal veins. COMPARISON:  09/09/2017 FINDINGS: RIGHT LOWER EXTREMITY Common Femoral Vein: No evidence of thrombus. Normal compressibility, respiratory phasicity and response to augmentation. Saphenofemoral Junction: No evidence of thrombus. Normal compressibility and flow on color Doppler imaging. Profunda Femoral Vein: No evidence of thrombus. Normal compressibility and flow on color Doppler imaging. Femoral Vein: No evidence of thrombus. Normal compressibility, respiratory phasicity and response to augmentation. Popliteal Vein: No evidence of thrombus. Normal compressibility, respiratory phasicity and response to augmentation. Calf Veins: No evidence of thrombus. Normal compressibility and flow on color Doppler imaging. Superficial Great Saphenous Vein: No evidence of thrombus. Normal compressibility. Venous Reflux:  None. Other Findings:  None. LEFT LOWER EXTREMITY Common Femoral Vein: No evidence of thrombus.  Normal compressibility, respiratory phasicity and response to augmentation. Saphenofemoral Junction: No evidence of thrombus. Normal compressibility and flow on color Doppler imaging. Profunda Femoral Vein: No evidence of thrombus. Normal compressibility and flow on color Doppler imaging. Femoral Vein: No evidence of thrombus. Normal compressibility, respiratory phasicity and response to augmentation. Popliteal Vein: No evidence of thrombus. Normal compressibility, respiratory phasicity and response to augmentation. Calf Veins: No evidence of thrombus. Normal compressibility and flow on color Doppler imaging. Superficial Great Saphenous Vein: No evidence of thrombus. Normal compressibility. Venous Reflux:  None. Other Findings:  None. IMPRESSION: No evidence of deep venous thrombosis. Electronically Signed   By: Alcide CleverMark  Lukens M.D.   On: 10/05/2017 09:13    Scheduled Meds: . albuterol  5 mg Nebulization Once  . amLODipine  5 mg Oral Daily  . aspirin EC  81 mg Oral Daily  . clopidogrel  75 mg Oral Daily  . enoxaparin (LOVENOX) injection  40 mg Subcutaneous Q24H  . folic acid  1 mg Oral Daily  . ipratropium-albuterol  3 mL Nebulization Q6H  . levothyroxine  200 mcg Oral QAC  breakfast  . methylPREDNISolone (SOLU-MEDROL) injection  40 mg Intravenous Q12H  . multivitamin with minerals  1 tablet Oral Daily  . sertraline  100 mg Oral Daily  . sodium chloride flush  3 mL Intravenous Q12H   Continuous Infusions: . sodium chloride    . sodium chloride      Assessment/Plan:  1. Acute hypercapnic respiratory failure.  Patient initially required BiPAP to blow off CO2.  Patient down to nasal cannula at this point.  Patient told me that he does not wear his oxygen all the time at home.  He was advised that he must wear oxygen 24/7.  Case discussed with care manager to check with his oxygen company to make sure he has 24 7 oxygen. 2. COPD exacerbation.  Solu-Medrol decreased to 40 mg every 12  hours. 3. Hyponatremia.  Continue to hold Lasix.  Give gentle IV fluid hydration.  Advised to stop drinking [facility]. 4. Weakness and falls.  Physical therapy evaluation.  Goal is to go home with home health   5. Orthostatic hypotension.  Stop Norvasc and give gentle IV fluid.   6. Depression on Zoloft 7. Hypothyroidism unspecified on levothyroxine 8. History of diastolic congestive heart failure.  No signs at this time.  Watch closely with IV fluid hydration.   Code Status:     Code Status Orders  (From admission, onward)        Start     Ordered   10/04/17 2026  Do not attempt resuscitation (DNR)  Continuous    Question Answer Comment  In the event of cardiac or respiratory ARREST Do not call a "code blue"   In the event of cardiac or respiratory ARREST Do not perform Intubation, CPR, defibrillation or ACLS   In the event of cardiac or respiratory ARREST Use medication by any route, position, wound care, and other measures to relive pain and suffering. May use oxygen, suction and manual treatment of airway obstruction as needed for comfort.      10/04/17 2025    Code Status History    Date Active Date Inactive Code Status Order ID Comments User Context   10/04/2017 1758 10/04/2017 2025 DNR [id number]  Ihor Austin, MD ED   09/13/2017 0657 09/13/2017 1547 DNR [id number]  Milagros Loll, MD Inpatient   09/09/2017 1333 09/13/2017 0657 Full Code [id number]  Ramonita Lab, MD ED   06/24/2017 2105 06/27/2017 1732 Full Code [id number]  Houston Siren, MD Inpatient   09/26/2016 0852 09/28/2016 2146 Full Code [id number]  Shaune Pollack, MD Inpatient   09/13/2016 2229 09/16/2016 1551 Full Code [id number]  Oralia Manis, MD ED   09/09/2016 0911 09/10/2016 1632 Full Code [id number]  Nada Libman, MD Inpatient   08/20/2016 0206 08/23/2016 2138 Full Code [id number]  Hugelmeyer, Alexis, DO Inpatient   07/16/2016 1312 07/27/2016 2018 Full Code [id number]  Erin Fulling, MD ED   03/09/2016 1720 03/15/2016 2206 Full Code  [id number]  Heide Spark Inpatient   03/03/2016 1809 03/09/2016 1604 Full Code [id number]  Erin Fulling, MD ED   02/27/2016 2241 02/29/2016 1953 Full Code [id number]  Oralia Manis, MD Inpatient   07/22/2015 1930 07/26/2015 0016 Full Code [id number]  Adrian Saran, MD Inpatient   02/11/2015 1028 02/11/2015 1815 Full Code [id number]  Nada Libman, MD Inpatient   08/04/2014 2013 08/13/2014 1453 Full Code [id number]  Lynden Oxford, MD Inpatient    Advance Directive Documentation     Most Recent Value  Type of Advance Directive  Healthcare Power  of Attorney, Living will  Pre-existing out of facility DNR order (yellow form or pink MOST form)  -  "MOST" Form in Place?  -     Disposition Plan: Hopefully home with home health.  Time spent: 26 minutes  Caroljean Monsivais [employer]

## 2017-10-06 NOTE — Evaluation (Signed)
Physical Therapy Evaluation Patient Details Name: Chad Huffman MRN: [id number] DOB: 16-Jun-1946 Today's Date: 10/06/2017   History of Present Illness  Pt is a 72 y.o. male presenting to hospital s/p fall (blacked out) with laceration back of head and also noted with SOB (pt found to have elevated CO2 and placed on BiPAP initially for respiratory distress but now transitioned back to nasal cannula).  Pt admitted to hospital with acute hypercapnic respiratory failure and acute COPD exacerbation.  PMH includes CHF, COPD on 2 L home O2, chronic LBP, peripheral neuropathy, c-spine fusion, antalgic gait.  Clinical Impression  Prior to hospital admission, pt was ambulatory with RW.  Pt lives alone in 1 level home with 4 steps to enter with B railings.  Pt initially min assist to stand from bed but pt able to stand CGA 2nd attempt standing using RW; pt also able to ambulate 180 feet with RW CGA (unsteadiness noted with ambulation but no loss of balance with use of RW during session).  Orthostatic vital signs taken during session:  Supine BP 114/55 with HR 99 bpm; sitting BP 105/60 with HR 111 bpm; standing at 0 minutes BP 112/54 with HR 104 bpm; standing at 3 minutes BP 97/61 with HR 105 bpm; and post ambulation BP 102/52 with HR 96 bpm; O2 sats 85-93% on 3 L O2 via nasal cannula during session's activities and O2 sats 90-91% end of session resting in chair; (nursing notified of pt's vitals).  Pt would benefit from skilled PT to address noted impairments and functional limitations (see below for any additional details).  Upon hospital discharge, recommend pt discharge to home with HHPT and intermittent supervision to assist as needed.    Follow Up Recommendations Home health PT;Supervision - Intermittent    Equipment Recommendations  Rolling walker with 5" wheels;3in1 (PT)    Recommendations for Other Services OT consult     Precautions / Restrictions Precautions Precautions: Fall Restrictions Weight  Bearing Restrictions: No      Mobility  Bed Mobility Overal bed mobility: Needs Assistance Bed Mobility: Supine to Sit     Supine to sit: Supervision;HOB elevated     General bed mobility comments: increased effort and time to perform on own  Transfers Overall transfer level: Needs assistance Equipment used: Rolling walker (2 wheeled) Transfers: Sit to/from Stand Sit to Stand: Min assist;Min guard         General transfer comment: min assist to stand 1st attempt from bed (assist to initiate stand) but CGA 2nd attempt standing from bed with RW use; increased effort to stand noted  Ambulation/Gait Ambulation/Gait assistance: Min guard Ambulation Distance (Feet): 180 Feet Assistive device: Rolling walker (2 wheeled)   Gait velocity: decreased   General Gait Details: decreased B step length/foot clearance/heelstrike; increased BOS; mildly unsteady but no loss of balance noted requiring assist to steady  Stairs            Wheelchair Mobility    Modified Rankin (Stroke Patients Only)       Balance Overall balance assessment: Needs assistance Sitting-balance support: No upper extremity supported;Feet supported Sitting balance-Leahy Scale: Good Sitting balance - Comments: steady sitting reaching within BOS   Standing balance support: Bilateral upper extremity supported;During functional activity Standing balance-Leahy Scale: Poor Standing balance comment: requires B UE support on RW for ambulation balance                             Pertinent  Vitals/Pain Pain Assessment: No/denies pain    Home Living Family/patient expects to be discharged to:: Private residence Living Arrangements: Alone   Type of Home: House Home Access: Stairs to enter Entrance Stairs-Rails: Right;Left;Can reach both Entrance Stairs-Number of Steps: 4 Home Layout: One level Home Equipment: Walker - 2 wheels;Wheelchair - manual;Cane - single point;Bedside commode;Shower  seat      Prior Function Level of Independence: Independent with assistive device(s)         Comments: Pt ambulatory with RW; pt reports 2 falls in last year.  2 L home O2 chronic.  Takes sponge baths.  Had home health services until about 2 weeks ago.     Hand Dominance        Extremity/Trunk Assessment   Upper Extremity Assessment Upper Extremity Assessment: Generalized weakness(R shoulder flexion grossly 30 degrees AROM (pt reports h/o rotator cuff injury) and L shoulder flexion grossly 100 degrees AROM)    Lower Extremity Assessment Lower Extremity Assessment: Generalized weakness    Cervical / Trunk Assessment Cervical / Trunk Assessment: Kyphotic  Communication   Communication: No difficulties  Cognition Arousal/Alertness: Awake/alert Behavior During Therapy: WFL for tasks assessed/performed Overall Cognitive Status: Within Functional Limits for tasks assessed                                        General Comments General comments (skin integrity, edema, etc.): Pt in bed finishing breakfast upon PT entry.  Nursing cleared pt for participation in physical therapy.  Pt agreeable to PT session.    Exercises     Assessment/Plan    PT Assessment Patient needs continued PT services  PT Problem List Decreased strength;Decreased balance;Decreased activity tolerance;Decreased mobility       PT Treatment Interventions DME instruction;Gait training;Stair training;Functional mobility training;Therapeutic activities;Therapeutic exercise;Balance training;Patient/family education    PT Goals (Current goals can be found in the Care Plan section)  Acute Rehab PT Goals Patient Stated Goal: to go home PT Goal Formulation: With patient Time For Goal Achievement: 10/20/17 Potential to Achieve Goals: Good    Frequency Min 2X/week   Barriers to discharge        Co-evaluation               AM-PAC PT "6 Clicks" Daily Activity  Outcome Measure  Difficulty turning over in bed (including adjusting bedclothes, sheets and blankets)?: A Little Difficulty moving from lying on back to sitting on the side of the bed? : A Lot Difficulty sitting down on and standing up from a chair with arms (e.g., wheelchair, bedside commode, etc,.)?: Unable Help needed moving to and from a bed to chair (including a wheelchair)?: A Little Help needed walking in hospital room?: A Little Help needed climbing 3-5 steps with a railing? : A Little 6 Click Score: 15    End of Session Equipment Utilized During Treatment: Gait belt;Oxygen(3 L O2 via nasal cannula) Activity Tolerance: Patient tolerated treatment well Patient left: in chair;with call bell/phone within reach;with chair alarm set Nurse Communication: Mobility status;Precautions;Other (comment)(Pt's vitals) PT Visit Diagnosis: Unsteadiness on feet (R26.81);Other abnormalities of gait and mobility (R26.89);Muscle weakness (generalized) (M62.81);History of falling (Z91.81)    Time: [id number] PT Time Calculation (min) (ACUTE ONLY): 52 min   Charges:   PT Evaluation $PT Eval Low Complexity: 1 Low PT Treatments $Therapeutic Exercise: 8-22 mins $Therapeutic Activity: 8-22 mins   PT G Codes:  Hendricks Limes, PT 10/06/17, 10:16 AM [phone]

## 2017-10-06 NOTE — Evaluation (Signed)
Occupational Therapy Evaluation Patient Details Name: Chad LabradorDanny L Kosanke MRN: [id number] DOB: 1945/06/29 Today's Date: 10/06/2017    History of Present Illness Pt is a 72 y.o. male presenting to hospital s/p fall (blacked out) with laceration back of head and also noted with SOB (pt found to have elevated CO2 and placed on BiPAP initially for respiratory distress but now transitioned back to nasal cannula).  Pt admitted to hospital with acute hypercapnic respiratory failure and acute COPD exacerbation.  PMH includes CHF, COPD on 2 L home O2, chronic LBP, peripheral neuropathy, c-spine fusion, antalgic gait.   Clinical Impression   Pt seen for OT evaluation this date. Pt [facility] with acute hypercapnic respiratory failure and acute COPD exacerbation with a few recent admissions in past 6 months. Pt currently requires minimal PRN assist for ADLs due to SOB, decreased endurance for functional tasks and at risk for falls. Pt instructed in energy conservation strategies including falls prevention, home/routines modifications, activity pacing, work simplification, engagement in meaningful activity, and pursed lip breathing in order to minimize exaccerbation of COPD symptoms, minimize falls risk, maximize independence, and minimize caregiver burden. Pt instructed in orthostatic hypotention and how to minimize risk. O2 during session 94-95% on 3L O2. Pt reports he was told by pulmonologist to wear home O2 only at night. Per chart review, MD noting he should be wearing 24/7. Strongly encouraged pt to clarify with MD to support maximal safety in self-management of his health. Pt would benefit from skilled OT services to increase independence in ADLs, education and facilitating  implementation of learned energy conservation techniques, pursed lip breathing and recommendations for home modifications to increase safety, prevent falls, and minimize caregiver burden while maximizing independence. Recommend  HHOT services and PRN supervision/assist upon discharge from the hospital.      Follow Up Recommendations  Home health OT;Supervision - Intermittent    Equipment Recommendations  Other (comment)(reacher)    Recommendations for Other Services       Precautions / Restrictions Precautions Precautions: Fall Restrictions Weight Bearing Restrictions: No      Mobility Bed Mobility               General bed mobility comments: deferred, up in the recliner  Transfers Overall transfer level: Needs assistance Equipment used: Rolling walker (2 wheeled) Transfers: Sit to/from Stand Sit to Stand: Min guard              Balance Overall balance assessment: Needs assistance Sitting-balance support: No upper extremity supported;Feet supported Sitting balance-Leahy Scale: Good     Standing balance support: Bilateral upper extremity supported;During functional activity Standing balance-Leahy Scale: Poor                             ADL either performed or assessed with clinical judgement   ADL Overall ADL's : Needs assistance/impaired                                       General ADL Comments: pt near baseline independence for completing basic self care tasks, however fatigues more quickly and would benefit from energy conservation strategies to improve performance, safety, and independence     Vision Patient Visual Report: No change from baseline       Perception     Praxis      Pertinent Vitals/Pain Pain Assessment: No/denies pain  Hand Dominance Right   Extremity/Trunk Assessment Upper Extremity Assessment Upper Extremity Assessment: Generalized weakness(R shoulder flexion grossly 30 degrees AROM (pt reports h/o rotator cuff injury) and L shoulder flexion grossly 100 degrees AROM, grip 4/5 bilat, sensation intact)   Lower Extremity Assessment Lower Extremity Assessment: Generalized weakness   Cervical / Trunk  Assessment Cervical / Trunk Assessment: Kyphotic   Communication Communication Communication: No difficulties   Cognition Arousal/Alertness: Awake/alert Behavior During Therapy: WFL for tasks assessed/performed Overall Cognitive Status: Within Functional Limits for tasks assessed                                     General Comments       Exercises Other Exercises Other Exercises: Pt instructed in energy conservation strategies including falls prevention, home/routines modifications, activity pacing, work simplification, engagement in meaningful activity, and pursed lip breathing in order to minimize exaccerbation of COPD symptoms, minimize falls risk, maximize independence, and minimize caregiver burden. Other Exercises: Pt instructed in orthostatic hypotention and how to minimize risk. O2 during session 94-95% on 3L O2. Pt reports he was told by pulmonologist to wear home O2 only at night. Per chart review, MD noting he should be wearing 24/7. Strongly encouraged pt to clarify with MD to support maximal safety in self-management of his health   Shoulder Instructions      Home Living Family/patient expects to be discharged to:: Private residence Living Arrangements: Alone Available Help at Discharge: Friend(s);Available PRN/intermittently Type of Home: House Home Access: Stairs to enter [employer] of Steps: 4 Entrance Stairs-Rails: Right;Left;Can reach both Home Layout: One level     Bathroom Shower/Tub: Tub/shower unit(takes sponge baths, planning to have walkin shower installed in next couple weeks)   Bathroom Toilet: Handicapped height     Home Equipment: Environmental consultant - 2 wheels;Wheelchair - manual;Cane - single point;Bedside commode;Shower seat          Prior Functioning/Environment Level of Independence: Needs assistance  Gait / Transfers Assistance Needed: indep with RW, reports 2 falls in past 12 months ADL's / Homemaking Assistance Needed: Has  a friend who sometimes brings meals and takes him out to eat and provides transportation, pt stopped driving recently voluntarily because he does not feel safe driving currently. Indep with sponge baths, dressing, light meal prep, med mgt   Comments: Had HHPT up until approx 2 weeks ago        OT Problem List: Decreased strength;Decreased knowledge of use of DME or AE;Decreased range of motion;Cardiopulmonary status limiting activity;Impaired UE functional use;Decreased activity tolerance;Impaired balance (sitting and/or standing)      OT Treatment/Interventions: Self-care/ADL training;Therapeutic exercise;Therapeutic activities;Energy conservation;DME and/or AE instruction;Patient/family education    OT Goals(Current goals can be found in the care plan section) Acute Rehab OT Goals Patient Stated Goal: to go home OT Goal Formulation: With patient Time For Goal Achievement: 10/20/17 Potential to Achieve Goals: Good ADL Goals Additional ADL Goal #1: Pt will verbalize plan for implementing at least 1 learned energy conservation strategy into routines at home to maximize safety and independence. Additional ADL Goal #2: Pt will implement learned energy conservation strategies during functional ADL tasks in order to minimize COPD exaccerbation and SOB.  OT Frequency: Min 1X/week   Barriers to D/C:            Co-evaluation              AM-PAC PT "6 Clicks" Daily  Activity     Outcome Measure Help from another person eating meals?: None Help from another person taking care of personal grooming?: None Help from another person toileting, which includes using toliet, bedpan, or urinal?: A Little Help from another person bathing (including washing, rinsing, drying)?: A Little Help from another person to put on and taking off regular upper body clothing?: None Help from another person to put on and taking off regular lower body clothing?: A Little 6 Click Score: 21   End of Session     Activity Tolerance: Patient tolerated treatment well Patient left: in chair;with call bell/phone within reach;with chair alarm set  OT Visit Diagnosis: Other abnormalities of gait and mobility (R26.89);Repeated falls (R29.6);Muscle weakness (generalized) (M62.81)                Time: 1610-9604 OT Time Calculation (min): 34 min Charges:  OT General Charges $OT Visit: 1 Visit OT Evaluation $OT Eval Moderate Complexity: 1 Mod OT Treatments $Self Care/Home Management : 8-22 mins  Richrd Prime, MPH, MS, OTR/L ascom [phone] 10/06/17, 4:21 PM

## 2017-10-07 LAB — BASIC METABOLIC PANEL
Anion gap: 8 (ref 5–15)
Anion gap: 9 (ref 5–15)
BUN: 34 mg/dL — ABNORMAL HIGH (ref 6–20)
BUN: 36 mg/dL — AB (ref 6–20)
CHLORIDE: 83 mmol/L — AB (ref 101–111)
CHLORIDE: 85 mmol/L — AB (ref 101–111)
CO2: 34 mmol/L — AB (ref 22–32)
CO2: 30 mmol/L (ref 22–32)
Calcium: 8.6 mg/dL — ABNORMAL LOW (ref 8.9–10.3)
Calcium: 8.5 mg/dL — ABNORMAL LOW (ref 8.9–10.3)
Creatinine, Ser: 1.04 mg/dL (ref 0.61–1.24)
Creatinine, Ser: 1.05 mg/dL (ref 0.61–1.24)
GFR calc Af Amer: >60 mL/min (ref >60)
GFR calc Af Amer: >60 mL/min (ref >60)
GLUCOSE: 140 mg/dL — AB (ref 65–99)
GLUCOSE: 130 mg/dL — AB (ref 65–99)
POTASSIUM: 4.2 mmol/L (ref 3.5–5.1)
POTASSIUM: 4.9 mmol/L (ref 3.5–5.1)
Sodium: 125 mmol/L — ABNORMAL LOW (ref 135–145)
Sodium: 124 mmol/L — ABNORMAL LOW (ref 135–145)

## 2017-10-07 LAB — SODIUM: Sodium: 126 mmol/L — ABNORMAL LOW (ref 135–145)

## 2017-10-07 MED ORDER — ACETYLCYSTEINE 20 % IN SOLN
600.0000 mg | Freq: Two times a day (BID) | RESPIRATORY_TRACT | Status: DC
  Administered 2017-10-07: 600 mg via ORAL
  Administered 2017-10-08: 800 mg via ORAL
  Administered 2017-10-08 – 2017-10-09 (×2): 600 mg via ORAL
  Filled 2017-10-07 (×4): qty 4

## 2017-10-07 MED ORDER — BUDESONIDE 0.25 MG/2ML IN SUSP
0.2500 mg | Freq: Two times a day (BID) | RESPIRATORY_TRACT | Status: DC
  Administered 2017-10-07 – 2017-10-09 (×4): 0.25 mg via RESPIRATORY_TRACT
  Filled 2017-10-07 (×4): qty 2

## 2017-10-07 MED ORDER — TOLVAPTAN 15 MG PO TABS
15.0000 mg | ORAL_TABLET | ORAL | Status: DC
  Administered 2017-10-07: 16:00:00 15 mg via ORAL
  Filled 2017-10-07: qty 1

## 2017-10-07 MED ORDER — DOCUSATE SODIUM 100 MG PO CAPS
200.0000 mg | ORAL_CAPSULE | Freq: Two times a day (BID) | ORAL | Status: DC
Start: 2017-10-07 — End: 2017-10-10
  Administered 2017-10-07 – 2017-10-10 (×7): 200 mg via ORAL
  Filled 2017-10-07 (×7): qty 2

## 2017-10-07 MED ORDER — ACETYLCYSTEINE 20 % IN SOLN: 600.0000 mg | Freq: Two times a day (BID) | RESPIRATORY_TRACT | Status: DC

## 2017-10-07 MED ORDER — METHYLPREDNISOLONE SODIUM SUCC 40 MG IJ SOLR
40.0000 mg | Freq: Every day | INTRAMUSCULAR | Status: DC
  Administered 2017-10-08 – 2017-10-10 (×3): 40 mg via INTRAVENOUS
  Filled 2017-10-07 (×3): qty 1

## 2017-10-07 MED ORDER — GUAIFENESIN ER 600 MG PO TB12
600.0000 mg | ORAL_TABLET | Freq: Two times a day (BID) | ORAL | Status: DC
  Administered 2017-10-07 – 2017-10-10 (×7): 600 mg via ORAL
  Filled 2017-10-07 (×7): qty 1

## 2017-10-07 MED ORDER — POLYETHYLENE GLYCOL 3350 17 G PO PACK
17.0000 g | PACK | Freq: Every day | ORAL | Status: DC
  Administered 2017-10-07 – 2017-10-09 (×3): 17 g via ORAL
  Filled 2017-10-07 (×3): qty 1

## 2017-10-07 NOTE — Progress Notes (Signed)
Physical Therapy Treatment Patient Details Name: Chad Huffman MRN: [id number] DOB: 10-02-1945 Today's Date: 10/07/2017    History of Present Illness Pt is a 72 y.o. male presenting to hospital s/p fall (blacked out) with laceration back of head and also noted with SOB (pt found to have elevated CO2 and placed on BiPAP initially for respiratory distress but now transitioned back to nasal cannula).  Pt admitted to hospital with acute hypercapnic respiratory failure and acute COPD exacerbation.  PMH includes CHF, COPD on 2 L home O2, chronic LBP, peripheral neuropathy, c-spine fusion, antalgic gait.    PT Comments    Pt demonstrated transfers (CGA to min A), and ambulation (RW CGA). Pt ambulated 80 ft to stairs; Pt then climbed 5 stairs (CGA to min A)  facing R rail, holding on w/ B hands, and stepping up w/ LLE and down w RLE (due to Pt report of mild RLE weakness). Pt reported feeling unsure of his balance w/ stair climbing; Pt educated in safe stair climbing and recommended that he have assistance at home. Pt O2 sats desat to 82% post stair climbing; Pt had a sitting rest break (~2 min), O2 sats increased to 95%; then Pt ambulated 200 ft around nurses station and back to room to sit in recliner. PT will focus on progressing balance w/ ambulation and stairs at next session.   Follow Up Recommendations  Home health PT;Supervision - Intermittent     Equipment Recommendations  Rolling walker with 5" wheels;3in1 (PT)    Recommendations for Other Services       Precautions / Restrictions Precautions Precautions: Fall Restrictions Weight Bearing Restrictions: No    Mobility  Bed Mobility               General bed mobility comments: Deferred; Pt in recliner upon PT arrival and returned to recliner at end of session  Transfers Overall transfer level: Needs assistance Equipment used: Rolling walker (2 wheeled) Transfers: Sit to/from Stand Sit to Stand: Min guard;Min assist          General transfer comment: assist to lift; Pt demonstrated good stability  Ambulation/Gait Ambulation/Gait assistance: Min guard Ambulation Distance (Feet): 280 Feet(80,200) Assistive device: Rolling walker (2 wheeled) Gait Pattern/deviations: Step-through pattern;Decreased step length - right Gait velocity: decreased   General Gait Details: CGA for stability; Pt reports feeling mild weakness in RLE which presents w/ decreased footclearance/heelstrike.   Stairs Stairs: Yes Stairs assistance: Min guard;Min assist Stair Management: One rail Right Number of Stairs: 5     Wheelchair Mobility    Modified Rankin (Stroke Patients Only)       Balance Overall balance assessment: Needs assistance Sitting-balance support: No upper extremity supported;Feet supported Sitting balance-Leahy Scale: Good Sitting balance - Comments: good stability sitting edge of recliner, managing clothing   Standing balance support: Bilateral upper extremity supported Standing balance-Leahy Scale: Poor Standing balance comment: required RW CGA                            Cognition Arousal/Alertness: Awake/alert Behavior During Therapy: WFL for tasks assessed/performed Overall Cognitive Status: Within Functional Limits for tasks assessed                                 General Comments: Pt A&O x4      Exercises      General Comments  Pt agreeable to session  Pertinent Vitals/Pain Pain Assessment: No/denies pain Pain Score: 0-No pain Pain Intervention(s): Limited activity within patient's tolerance;Monitored during session;Repositioned    Home Living                      Prior Function            PT Goals (current goals can now be found in the care plan section) Acute Rehab PT Goals Patient Stated Goal: to go home PT Goal Formulation: With patient Time For Goal Achievement: 10/20/17 Potential to Achieve Goals: Good Progress towards PT  goals: Progressing toward goals    Frequency    Min 2X/week      PT Plan      Co-evaluation              AM-PAC PT "6 Clicks" Daily Activity  Outcome Measure  Difficulty turning over in bed (including adjusting bedclothes, sheets and blankets)?: A Little Difficulty moving from lying on back to sitting on the side of the bed? : A Little Difficulty sitting down on and standing up from a chair with arms (e.g., wheelchair, bedside commode, etc,.)?: Unable Help needed moving to and from a bed to chair (including a wheelchair)?: A Little Help needed walking in hospital room?: A Little Help needed climbing 3-5 steps with a railing? : A Little 6 Click Score: 16    End of Session Equipment Utilized During Treatment: Gait belt;Oxygen Activity Tolerance: Patient limited by fatigue;Treatment limited secondary to medical complications (Comment)(Pt desat post stair climbing (see clinical impression for details).) Patient left: in chair;with call bell/phone within reach;with chair alarm set Nurse Communication: Mobility status;Precautions;Other (comment) PT Visit Diagnosis: Unsteadiness on feet (R26.81);Other abnormalities of gait and mobility (R26.89);Muscle weakness (generalized) (M62.81);History of falling (Z91.81)     Time: 8295-6213 PT Time Calculation (min) (ACUTE ONLY): 25 min  Charges:                       G Codes:       Chad Huffman, SPT 10/07/2017, 2:21 PM

## 2017-10-07 NOTE — Care Management Important Message (Signed)
Important Message  Patient Details  Name: Chad LabradorDanny L Draughn MRN: [id number] Date of Birth: 10/09/1945   Medicare Important Message Given:  Yes    Olegario MessierKathy A Karanveer Ramakrishnan 10/07/2017, 1:38 PM

## 2017-10-07 NOTE — Progress Notes (Signed)
Patient ID: Chad Huffman, male   DOB: 1945-07-12, 72 y.o.   MRN: [id number]  [employer] PROGRESS NOTE  Chad Huffman:[id number] DOB: October 08, 1945 DOA: 10/04/2017 PCP: Dorothey Baseman, MD  HPI/Subjective: Still short of breath, still having cough  Objective: Vitals:   10/07/17 0429 10/07/17 1323  BP: (!) 163/67 (!) 154/75  Pulse: 88 75  Resp: 18 18  Temp: 97.6 F (36.4 C) 97.9 F (36.6 C)  SpO2: 96% 96%    Filed Weights   10/04/17 1524 10/06/17 0612 10/07/17 0528  Weight: 103.4 kg (228 lb) 99.7 kg (219 lb 12.8 oz) 103 kg (227 lb)    ROS: Review of Systems  Constitutional: Positive for malaise/fatigue. Negative for chills and fever.  Eyes: Negative for blurred vision.  Respiratory: Positive for cough and shortness of breath.   Cardiovascular: Negative for chest pain.  Gastrointestinal: Negative for abdominal pain, constipation, diarrhea, nausea and vomiting.  Genitourinary: Negative for dysuria.  Musculoskeletal: Negative for joint pain.  Neurological: Negative for dizziness and headaches.   Exam: Physical Exam  Constitutional: He is oriented to person, place, and time.  HENT:  Nose: No mucosal edema.  Mouth/Throat: No oropharyngeal exudate or posterior oropharyngeal edema.  Eyes: Pupils are equal, round, and reactive to light. Conjunctivae, EOM and lids are normal.  Neck: No JVD present. Carotid bruit is not present. No edema present. No thyroid mass and no thyromegaly present.  Cardiovascular: S1 normal and S2 normal. Exam reveals no gallop.  No murmur heard. Pulses:      Dorsalis pedis pulses are 2+ on the right side, and 2+ on the left side.  Respiratory: No respiratory distress. He has decreased breath sounds in the right lower field and the left lower field. He has no wheezes. He has no rhonchi. He has no rales.  GI: Soft. Bowel sounds are normal. There is no tenderness.  Musculoskeletal:       Right ankle: He exhibits no swelling.       Left ankle: He  exhibits no swelling.  Lymphadenopathy:    He has no cervical adenopathy.  Neurological: He is alert and oriented to person, place, and time. No cranial nerve deficit.  Skin: Skin is warm. No rash noted. Nails show no clubbing.  Psychiatric: He has a normal mood and affect.      Data Reviewed: Basic Metabolic Panel: Recent Labs  Lab 10/04/17 1611 10/05/17 0457 10/06/17 0402 10/07/17 0935  NA 125* 129* 128* 125*  K 4.2 4.2 4.1 4.2  CL 81* 85* 83* 83*  CO2 39* 39* 36* 34*  GLUCOSE 92 83 179* 140*  BUN 25* 23* 35* 34*  CREATININE 0.89  0.90 0.97 1.12 1.04  CALCIUM 8.7* 8.6* 8.3* 8.6*   Liver Function Tests: Recent Labs  Lab 10/04/17 1611  AST 21  ALT 14*  ALKPHOS 60  BILITOT 1.5*  PROT 6.7  ALBUMIN 3.7   CBC: Recent Labs  Lab 10/04/17 1611 10/05/17 0457  WBC 6.4  5.9 4.4  NEUTROABS 4.4  --   HGB 11.1*  11.1* 10.6*  HCT 33.1*  32.3* 31.2*  MCV 93.7  93.7 93.5  PLT 125*  133* 123*   Cardiac Enzymes: Recent Labs  Lab 10/04/17 1611  TROPONINI <0.03   BNP (last 3 results) Recent Labs    06/24/17 1530 10/04/17 1611  BNP 228.0* 221.0*     CBG: Recent Labs  Lab 10/04/17 1958  GLUCAP 80    Recent Results (from the past 240  hour(s))  Blood Culture (routine x 2)     Status: None (Preliminary result)   Collection Time: 10/04/17  4:11 PM  Result Value Ref Range Status   Specimen Description BLOOD RIGHT ANTECUBITAL  Final   Special Requests   Final    BOTTLES DRAWN AEROBIC AND ANAEROBIC Blood Culture adequate volume   Culture   Final    NO GROWTH 3 DAYS Performed at [facility], [address] [place], [place] [address]    Report Status PENDING  Incomplete  Blood Culture (routine x 2)     Status: None (Preliminary result)   Collection Time: 10/04/17  4:36 PM  Result Value Ref Range Status   Specimen Description BLOOD BLOOD RIGHT FOREARM  Final   Special Requests   Final    BOTTLES DRAWN AEROBIC AND ANAEROBIC Blood Culture  adequate volume   Culture   Final    NO GROWTH 3 DAYS Performed at [facility], [address] [place], [place] [address]    Report Status PENDING  Incomplete  MRSA PCR Screening     Status: None   Collection Time: 10/04/17  8:02 PM  Result Value Ref Range Status   MRSA by PCR NEGATIVE NEGATIVE Final    Comment:        The GeneXpert MRSA Assay (FDA approved for NASAL specimens only), is one component of a comprehensive MRSA colonization surveillance program. It is not intended to diagnose MRSA infection nor to guide or monitor treatment for MRSA infections. Performed at [facility], [address] [place], [place] [address]      Studies: No results found.  Scheduled Meds: . acetylcysteine  600 mg Oral BID  . albuterol  5 mg Nebulization Once  . aspirin EC  81 mg Oral Daily  . budesonide (PULMICORT) nebulizer solution  0.25 mg Nebulization BID  . clopidogrel  75 mg Oral Daily  . docusate sodium  200 mg Oral BID  . enoxaparin (LOVENOX) injection  40 mg Subcutaneous Q24H  . folic acid  1 mg Oral Daily  . guaiFENesin  600 mg Oral BID  . ipratropium-albuterol  3 mL Nebulization Q6H  . levothyroxine  200 mcg Oral QAC breakfast  . methylPREDNISolone (SOLU-MEDROL) injection  40 mg Intravenous Q12H  . multivitamin with minerals  1 tablet Oral Daily  . polyethylene glycol  17 g Oral Daily  . sertraline  100 mg Oral Daily  . sodium chloride flush  3 mL Intravenous Q12H   Continuous Infusions: . sodium chloride    . sodium chloride 50 mL/hr at 10/07/17 1331    Assessment/Plan:  1. Acute hypercapnic respiratory failure.  Patient initially required BiPAP to blow off CO2.  Patient down to nasal cannula at this point.  Patient told me that he does not wear his oxygen all the time at home.  He was advised that he must wear oxygen 24/7.  Case discussed with care manager to check with his oxygen company to make sure he has 24 7 oxygen. 2. COPD exacerbation.   Continue solumedrol 3. Hyponatremia.  Na now worst, I will give dose of trovaptan 4. Weakness and falls.  Physical therapy evaluation.  Goal is to go home with home health   5. Orthostatic hypotension.  Stop Norvasc and give gentle IV fluid.   6. Depression on Zoloft 7. Hypothyroidism unspecified on levothyroxine 8. History of diastolic congestive heart failure.  No signs at this time.  Watch closely with IV fluid hydration.  Code Status:     Code Status Orders  (From admission, onward)        Start     Ordered   10/04/17 2026  Do not attempt resuscitation (DNR)  Continuous    Question Answer Comment  In the event of cardiac or respiratory ARREST Do not call a "code blue"   In the event of cardiac or respiratory ARREST Do not perform Intubation, CPR, defibrillation or ACLS   In the event of cardiac or respiratory ARREST Use medication by any route, position, wound care, and other measures to relive pain and suffering. May use oxygen, suction and manual treatment of airway obstruction as needed for comfort.      10/04/17 2025    Code Status History    Date Active Date Inactive Code Status Order ID Comments User Context   10/04/2017 1758 10/04/2017 2025 DNR [id number]  Ihor AustinPyreddy, Pavan, MD ED   09/13/2017 0657 09/13/2017 1547 DNR [id number]  Milagros LollSudini, Srikar, MD Inpatient   09/09/2017 1333 09/13/2017 0657 Full Code [id number]  Ramonita LabGouru, Aruna, MD ED   06/24/2017 2105 06/27/2017 1732 Full Code [id number]  Houston SirenSainani, Vivek J, MD Inpatient   09/26/2016 0852 09/28/2016 2146 Full Code [id number]  Shaune Pollackhen, Qing, MD Inpatient   09/13/2016 2229 09/16/2016 1551 Full Code [id number]  Oralia ManisWillis, [name], MD ED   09/09/2016 0911 09/10/2016 1632 Full Code [id number]  Nada LibmanBrabham, Vance W, MD Inpatient   08/20/2016 0206 08/23/2016 2138 Full Code [id number]  Hugelmeyer, Alexis, DO Inpatient   07/16/2016 1312 07/27/2016 2018 Full Code [id number]  Erin FullingKasa, Kurian, MD ED   03/09/2016 1720 03/15/2016 2206 Full Code [id number]  Heide SparkKing, Angel Inpatient    03/03/2016 1809 03/09/2016 1604 Full Code [id number]  Erin FullingKasa, Kurian, MD ED   02/27/2016 2241 02/29/2016 1953 Full Code [id number]  Oralia ManisWillis, [name], MD Inpatient   07/22/2015 1930 07/26/2015 0016 Full Code [id number]  Adrian SaranMody, Sital, MD Inpatient   02/11/2015 1028 02/11/2015 1815 Full Code [id number]  Nada LibmanBrabham, Vance W, MD Inpatient   08/04/2014 2013 08/13/2014 1453 Full Code [id number]  Lynden OxfordPatel, Pranav, MD Inpatient    Advance Directive Documentation     Most Recent Value  Type of Advance Directive  Healthcare Power of Attorney, Living will  Pre-existing out of facility DNR order (yellow form or pink MOST form)  -  "MOST" Form in Place?  -     Disposition Plan: Hopefully home with home health.  Time spent: 26 minutes  Altonio Schwertner [employer]  [employer]

## 2017-10-08 LAB — BASIC METABOLIC PANEL
Anion gap: 3 — ABNORMAL LOW (ref 5–15)
BUN: 40 mg/dL — AB (ref 6–20)
CHLORIDE: 91 mmol/L — AB (ref 101–111)
CO2: 37 mmol/L — ABNORMAL HIGH (ref 22–32)
Calcium: 8.3 mg/dL — ABNORMAL LOW (ref 8.9–10.3)
Creatinine, Ser: 1.2 mg/dL (ref 0.61–1.24)
GFR calc Af Amer: >60 mL/min (ref >60)
GFR calc non Af Amer: 60 mL/min — ABNORMAL LOW (ref >60)
Glucose, Bld: 95 mg/dL (ref 65–99)
POTASSIUM: 4.5 mmol/L (ref 3.5–5.1)
Sodium: 131 mmol/L — ABNORMAL LOW (ref 135–145)

## 2017-10-08 MED ORDER — MAGNESIUM CITRATE PO SOLN
0.5000 | Freq: Once | ORAL | Status: AC
Start: 2017-10-08 — End: 2017-10-08
  Administered 2017-10-08: 16:00:00 0.5 via ORAL
  Filled 2017-10-08: qty 296

## 2017-10-08 MED ORDER — LACTULOSE 10 GM/15ML PO SOLN
30.0000 g | Freq: Two times a day (BID) | ORAL | Status: DC
Start: 2017-10-08 — End: 2017-10-10
  Administered 2017-10-08: 15:00:00 30 g via ORAL
  Filled 2017-10-08 (×3): qty 60

## 2017-10-08 NOTE — Progress Notes (Signed)
Occupational Therapy Treatment Patient Details Name: Chad Huffman MRN: [id number] DOB: [birth year] Today's Date: 10/08/2017    History of present illness Pt is a 72 y.o. male presenting to hospital s/p fall (blacked out) with laceration back of head and also noted with SOB (pt found to have elevated CO2 and placed on BiPAP initially for respiratory distress but now transitioned back to nasal cannula).  Pt admitted to hospital with acute hypercapnic respiratory failure and acute COPD exacerbation.  PMH includes CHF, COPD on 2 L home O2, chronic LBP, peripheral neuropathy, c-spine fusion, antalgic gait.   OT comments  Pt seen for OT treatment this date. Pt did not recall meeting OT previously 2 days ago. To support recall and carryover of learned energy conservation strategies, OT reviewed and pt verbalized understanding. Also reviewed current recommendations for 24/7 O2 use. Pt mildly agitated by conversation and perseverates on previous PCP a few months ago indicating that he did not need to wear 24/7, only overnight. Encouraged pt to get clarification from the MD. RN notified of pt's confusion regarding instructions for home O2 use in order to get clarification and relay to pt. Will continue to progress.   Follow Up Recommendations  Home health OT;Supervision - Intermittent    Equipment Recommendations  Other (comment)(reacher)    Recommendations for Other Services      Precautions / Restrictions Precautions Precautions: Fall Restrictions Weight Bearing Restrictions: No       Mobility Bed Mobility                  Transfers                      Balance Overall balance assessment: Needs assistance Sitting-balance support: No upper extremity supported;Feet supported                                       ADL either performed or assessed with clinical judgement   ADL                                               Vision  Patient Visual Report: No change from baseline     Perception     Praxis      Cognition Arousal/Alertness: Awake/alert Behavior During Therapy: WFL for tasks assessed/performed Overall Cognitive Status: Within Functional Limits for tasks assessed/difficult to assess (mild confusion/STM loss noted)                                          Exercises Other Exercises Other Exercises: OT reviewed instructions in ECS from previous session and facilitated problem solving with implementation for home to support improved activity tolerance over time and maximize safety and independence. Pt verbalized understanding.  Other Exercises: Per chart review, multiple providers have indicated the need to wear O2 24/7. When OT attempted to discuss O2 use in the home, pt became mildly agitated during discussion, reiterating that his PCP told him a few months ago that he only had to wear at night. Pt does not recall other providers talking to him about this. RN notified of pt's confusion regarding instructions for home O2 use in order  to get clarification and relay to pt.    Shoulder Instructions       General Comments      Pertinent Vitals/ Pain       Pain Assessment: No/denies pain  Home Living                                          Prior Functioning/Environment              Frequency  Min 1X/week        Progress Toward Goals  OT Goals(current goals can now be found in the care plan section)  Progress towards OT goals: OT to reassess next treatment  Acute Rehab OT Goals Patient Stated Goal: to go home OT Goal Formulation: With patient Time For Goal Achievement: 10/20/17 Potential to Achieve Goals: Good  Plan Discharge plan remains appropriate;Frequency remains appropriate    Co-evaluation                 AM-PAC PT "6 Clicks" Daily Activity     Outcome Measure   Help from another person eating meals?: None Help from another person  taking care of personal grooming?: None Help from another person toileting, which includes using toliet, bedpan, or urinal?: A Little Help from another person bathing (including washing, rinsing, drying)?: A Little Help from another person to put on and taking off regular upper body clothing?: None Help from another person to put on and taking off regular lower body clothing?: A Little 6 Click Score: 21    End of Session    OT Visit Diagnosis: Other abnormalities of gait and mobility (R26.89);Repeated falls (R29.6);Muscle weakness (generalized) (M62.81)   Activity Tolerance Patient tolerated treatment well   Patient Left in chair;with call bell/phone within reach;with chair alarm set   Nurse Communication          Time: [id number] OT Time Calculation (min): 12 min  Charges: OT General Charges $OT Visit: 1 Visit OT Treatments $Self Care/Home Management : 8-22 mins  Richrd PrimeJamie Stiller, MPH, MS, OTR/L ascom [phone] 10/08/17, 12:30 PM

## 2017-10-08 NOTE — Progress Notes (Signed)
Patient ID: Chad Huffman, male   DOB: January 26, 1946, 72 y.o.   MRN: [id number]  [employer] PROGRESS NOTE  Chad Huffman GQQ:[id number] DOB: Nov 03, 1945 DOA: 10/04/2017 PCP: Dorothey Baseman, MD  HPI/Subjective: Patient's breathing is improved, continues to complain of constipation  Objective: Vitals:   10/08/17 0737 10/08/17 0818  BP:  (!) 145/77  Pulse:  79  Resp:    Temp:    SpO2: 93%     Filed Weights   10/06/17 0612 10/07/17 0528 10/08/17 0504  Weight: 99.7 kg (219 lb 12.8 oz) 103 kg (227 lb) 102.9 kg (226 lb 12.8 oz)    ROS: Review of Systems  Constitutional: Positive for malaise/fatigue. Negative for chills and fever.  Eyes: Negative for blurred vision.  Respiratory: Positive for cough and shortness of breath.   Cardiovascular: Negative for chest pain.  Gastrointestinal: Negative for abdominal pain, constipation, diarrhea, nausea and vomiting.  Genitourinary: Negative for dysuria.  Musculoskeletal: Negative for joint pain.  Neurological: Negative for dizziness and headaches.   Exam: Physical Exam  Constitutional: He is oriented to person, place, and time.  HENT:  Nose: No mucosal edema.  Mouth/Throat: No oropharyngeal exudate or posterior oropharyngeal edema.  Eyes: Pupils are equal, round, and reactive to light. Conjunctivae, EOM and lids are normal.  Neck: No JVD present. Carotid bruit is not present. No edema present. No thyroid mass and no thyromegaly present.  Cardiovascular: S1 normal and S2 normal. Exam reveals no gallop.  No murmur heard. Pulses:      Dorsalis pedis pulses are 2+ on the right side, and 2+ on the left side.  Respiratory: No respiratory distress. He has decreased breath sounds in the right lower field and the left lower field. He has no wheezes. He has no rhonchi. He has no rales.  GI: Soft. Bowel sounds are normal. There is no tenderness.  Musculoskeletal:       Right ankle: He exhibits no swelling.       Left ankle: He exhibits no  swelling.  Lymphadenopathy:    He has no cervical adenopathy.  Neurological: He is alert and oriented to person, place, and time. No cranial nerve deficit.  Skin: Skin is warm. No rash noted. Nails show no clubbing.  Psychiatric: He has a normal mood and affect.      Data Reviewed: Basic Metabolic Panel: Recent Labs  Lab 10/05/17 0457 10/06/17 0402 10/07/17 0935 10/07/17 1433 10/07/17 2216 10/08/17 0620  NA 129* 128* 125* 124* 126* 131*  K 4.2 4.1 4.2 4.9  --  4.5  CL 85* 83* 83* 85*  --  91*  CO2 39* 36* 34* 30  --  37*  GLUCOSE 83 179* 140* 130*  --  95  BUN 23* 35* 34* 36*  --  40*  CREATININE 0.97 1.12 1.04 1.05  --  1.20  CALCIUM 8.6* 8.3* 8.6* 8.5*  --  8.3*   Liver Function Tests: Recent Labs  Lab 10/04/17 1611  AST 21  ALT 14*  ALKPHOS 60  BILITOT 1.5*  PROT 6.7  ALBUMIN 3.7   CBC: Recent Labs  Lab 10/04/17 1611 10/05/17 0457  WBC 6.4  5.9 4.4  NEUTROABS 4.4  --   HGB 11.1*  11.1* 10.6*  HCT 33.1*  32.3* 31.2*  MCV 93.7  93.7 93.5  PLT 125*  133* 123*   Cardiac Enzymes: Recent Labs  Lab 10/04/17 1611  TROPONINI <0.03   BNP (last 3 results) Recent Labs    06/24/17 1530  10/04/17 1611  BNP 228.0* 221.0*     CBG: Recent Labs  Lab 10/04/17 1958  GLUCAP 80    Recent Results (from the past 240 hour(s))  Blood Culture (routine x 2)     Status: None (Preliminary result)   Collection Time: 10/04/17  4:11 PM  Result Value Ref Range Status   Specimen Description BLOOD RIGHT ANTECUBITAL  Final   Special Requests   Final    BOTTLES DRAWN AEROBIC AND ANAEROBIC Blood Culture adequate volume   Culture   Final    NO GROWTH 4 DAYS Performed at [facility], [address] [place], [place] [address]    Report Status PENDING  Incomplete  Blood Culture (routine x 2)     Status: None (Preliminary result)   Collection Time: 10/04/17  4:36 PM  Result Value Ref Range Status   Specimen Description BLOOD BLOOD RIGHT FOREARM  Final    Special Requests   Final    BOTTLES DRAWN AEROBIC AND ANAEROBIC Blood Culture adequate volume   Culture   Final    NO GROWTH 4 DAYS Performed at [facility], [address] [place], [place] [address]    Report Status PENDING  Incomplete  MRSA PCR Screening     Status: None   Collection Time: 10/04/17  8:02 PM  Result Value Ref Range Status   MRSA by PCR NEGATIVE NEGATIVE Final    Comment:        The GeneXpert MRSA Assay (FDA approved for NASAL specimens only), is one component of a comprehensive MRSA colonization surveillance program. It is not intended to diagnose MRSA infection nor to guide or monitor treatment for MRSA infections. Performed at [facility], [address] [place], [place] [address]      Studies: No results found.  Scheduled Meds: . acetylcysteine  600 mg Oral BID  . albuterol  5 mg Nebulization Once  . aspirin EC  81 mg Oral Daily  . budesonide (PULMICORT) nebulizer solution  0.25 mg Nebulization BID  . clopidogrel  75 mg Oral Daily  . docusate sodium  200 mg Oral BID  . enoxaparin (LOVENOX) injection  40 mg Subcutaneous Q24H  . folic acid  1 mg Oral Daily  . guaiFENesin  600 mg Oral BID  . ipratropium-albuterol  3 mL Nebulization Q6H  . levothyroxine  200 mcg Oral QAC breakfast  . methylPREDNISolone (SOLU-MEDROL) injection  40 mg Intravenous Daily  . multivitamin with minerals  1 tablet Oral Daily  . polyethylene glycol  17 g Oral Daily  . sertraline  100 mg Oral Daily  . sodium chloride flush  3 mL Intravenous Q12H   Continuous Infusions: . sodium chloride      Assessment/Plan:  1. Acute hypercapnic respiratory failure.  Patient initially required BiPAP to blow off CO2.  Patient down to nasal cannula at this point.  Slowly improving will likely need oxygen on discharge 2. COPD exacerbation.  Continue solumedrol 3. Hyponatremia.  Improved with tolvaptan I have stopped tolvaptan have asked patient to fluid  restrict 4. Weakness and falls.  Physical therapy evaluation.  Goal is to go home with home health   5. Orthostatic hypotension.  Recheck orthostatics tomorrow morning 6. Depression on Zoloft 7. Hypothyroidism unspecified on levothyroxine 8. History of diastolic congestive heart failure.  No signs at this time.  Watch closely with IV fluid hydration.   Code Status:     Code Status Orders  (From admission, onward)  Start     Ordered   10/04/17 2026  Do not attempt resuscitation (DNR)  Continuous    Question Answer Comment  In the event of cardiac or respiratory ARREST Do not call a "code blue"   In the event of cardiac or respiratory ARREST Do not perform Intubation, CPR, defibrillation or ACLS   In the event of cardiac or respiratory ARREST Use medication by any route, position, wound care, and other measures to relive pain and suffering. May use oxygen, suction and manual treatment of airway obstruction as needed for comfort.      10/04/17 2025    Code Status History    Date Active Date Inactive Code Status Order ID Comments User Context   10/04/2017 1758 10/04/2017 2025 DNR [id number]  Ihor Austin, MD ED   09/13/2017 0657 09/13/2017 1547 DNR [id number]  Milagros Loll, MD Inpatient   09/09/2017 1333 09/13/2017 0657 Full Code [id number]  Ramonita Lab, MD ED   06/24/2017 2105 06/27/2017 1732 Full Code [id number]  Houston Siren, MD Inpatient   09/26/2016 0852 09/28/2016 2146 Full Code [id number]  Shaune Pollack, MD Inpatient   09/13/2016 2229 09/16/2016 1551 Full Code [id number]  Oralia Manis, MD ED   09/09/2016 0911 09/10/2016 1632 Full Code [id number]  Nada Libman, MD Inpatient   08/20/2016 0206 08/23/2016 2138 Full Code [id number]  Hugelmeyer, Alexis, DO Inpatient   07/16/2016 1312 07/27/2016 2018 Full Code [id number]  Erin Fulling, MD ED   03/09/2016 1720 03/15/2016 2206 Full Code [id number]  Heide Spark Inpatient   03/03/2016 1809 03/09/2016 1604 Full Code [id number]  Erin Fulling, MD ED   02/27/2016  2241 02/29/2016 1953 Full Code [id number]  Oralia Manis, MD Inpatient   07/22/2015 1930 07/26/2015 0016 Full Code [id number]  Adrian Saran, MD Inpatient   02/11/2015 1028 02/11/2015 1815 Full Code [id number]  Nada Libman, MD Inpatient   08/04/2014 2013 08/13/2014 1453 Full Code [id number]  Lynden Oxford, MD Inpatient    Advance Directive Documentation     Most Recent Value  Type of Advance Directive  Healthcare Power of Attorney, Living will  Pre-existing out of facility DNR order (yellow form or pink MOST form)  -  "MOST" Form in Place?  -     Disposition Plan: Hopefully home with home health.  Time spent: 26 minutes  Jarissa Sheriff [employer]

## 2017-10-09 ENCOUNTER — Encounter: Payer: Self-pay | Admitting: Radiology

## 2017-10-09 ENCOUNTER — Inpatient Hospital Stay: Payer: Medicare Other

## 2017-10-09 DIAGNOSIS — J441 Chronic obstructive pulmonary disease with (acute) exacerbation: Secondary | ICD-10-CM

## 2017-10-09 LAB — CULTURE, BLOOD (ROUTINE X 2)
Culture: NO GROWTH
Culture: NO GROWTH
SPECIAL REQUESTS: ADEQUATE
SPECIAL REQUESTS: ADEQUATE

## 2017-10-09 LAB — BASIC METABOLIC PANEL
Anion gap: 3 — ABNORMAL LOW (ref 5–15)
BUN: 37 mg/dL — ABNORMAL HIGH (ref 6–20)
CHLORIDE: 91 mmol/L — AB (ref 101–111)
CO2: 35 mmol/L — AB (ref 22–32)
CREATININE: 0.84 mg/dL (ref 0.61–1.24)
Calcium: 8.2 mg/dL — ABNORMAL LOW (ref 8.9–10.3)
GFR calc non Af Amer: >60 mL/min (ref >60)
Glucose, Bld: 91 mg/dL (ref 65–99)
POTASSIUM: 4.6 mmol/L (ref 3.5–5.1)
SODIUM: 129 mmol/L — AB (ref 135–145)

## 2017-10-09 LAB — PROCALCITONIN: Procalcitonin: <0.1 ng/mL

## 2017-10-09 MED ORDER — TIOTROPIUM BROMIDE MONOHYDRATE 18 MCG IN CAPS
18.0000 ug | ORAL_CAPSULE | Freq: Every day | RESPIRATORY_TRACT | Status: DC
  Administered 2017-10-09 – 2017-10-10 (×2): 18 ug via RESPIRATORY_TRACT
  Filled 2017-10-09: qty 5

## 2017-10-09 MED ORDER — IOPAMIDOL (ISOVUE-300) INJECTION 61%
75.0000 mL | Freq: Once | INTRAVENOUS | Status: AC | PRN
  Administered 2017-10-09: 75 mL via INTRAVENOUS

## 2017-10-09 MED ORDER — GUAIFENESIN-DM 100-10 MG/5ML PO SYRP
5.0000 mL | ORAL_SOLUTION | ORAL | Status: DC | PRN
  Filled 2017-10-09: qty 5

## 2017-10-09 MED ORDER — FLUTICASONE FUROATE-VILANTEROL 100-25 MCG/INH IN AEPB
1.0000 | INHALATION_SPRAY | Freq: Every day | RESPIRATORY_TRACT | Status: DC
  Administered 2017-10-09 – 2017-10-10 (×2): 1 via RESPIRATORY_TRACT
  Filled 2017-10-09: qty 28

## 2017-10-09 MED ORDER — LEVOFLOXACIN 500 MG PO TABS
500.0000 mg | ORAL_TABLET | Freq: Every day | ORAL | Status: DC
  Administered 2017-10-09 – 2017-10-10 (×2): 500 mg via ORAL
  Filled 2017-10-09 (×2): qty 1

## 2017-10-09 MED ORDER — BISACODYL 10 MG RE SUPP
10.0000 mg | Freq: Once | RECTAL | Status: AC
  Administered 2017-10-09: 22:00:00 10 mg via RECTAL
  Filled 2017-10-09: qty 1

## 2017-10-09 NOTE — Progress Notes (Signed)
Patient has red, blanchable bottom but refuses to wear pink foam. Patient educated on importance of repositioning and verbalized understanding.

## 2017-10-09 NOTE — Progress Notes (Signed)
Patient ID: Chad Huffman, male   DOB: Jul 28, 1945, 72 y.o.   MRN: [id number]  [employer] PROGRESS NOTE  CHAE OOMMEN WUJ:[id number] DOB: 10/02/1945 DOA: 10/04/2017 PCP: Dorothey Baseman, MD  HPI/Subjective: Patient continues to complain of shortness of breath  Objective: Vitals:   10/09/17 0745 10/09/17 1249  BP:  (!) 169/76  Pulse:  69  Resp:  20  Temp:  97.9 F (36.6 C)  SpO2: 92% 92%    Filed Weights   10/07/17 0528 10/08/17 0504 10/09/17 0621  Weight: 103 kg (227 lb) 102.9 kg (226 lb 12.8 oz) 103.3 kg (227 lb 12.8 oz)    ROS: Review of Systems  Constitutional: Positive for malaise/fatigue. Negative for chills and fever.  Eyes: Negative for blurred vision.  Respiratory: Positive for cough and shortness of breath.   Cardiovascular: Negative for chest pain.  Gastrointestinal: Negative for abdominal pain, constipation, diarrhea, nausea and vomiting.  Genitourinary: Negative for dysuria.  Musculoskeletal: Negative for joint pain.  Neurological: Negative for dizziness and headaches.   Exam: Physical Exam  Constitutional: He is oriented to person, place, and time.  HENT:  Nose: No mucosal edema.  Mouth/Throat: No oropharyngeal exudate or posterior oropharyngeal edema.  Eyes: Pupils are equal, round, and reactive to light. Conjunctivae, EOM and lids are normal.  Neck: No JVD present. Carotid bruit is not present. No edema present. No thyroid mass and no thyromegaly present.  Cardiovascular: S1 normal and S2 normal. Exam reveals no gallop.  No murmur heard. Pulses:      Dorsalis pedis pulses are 2+ on the right side, and 2+ on the left side.  Respiratory: No respiratory distress. He has decreased breath sounds in the right lower field and the left lower field. He has no wheezes. He has no rhonchi. He has no rales.  GI: Soft. Bowel sounds are normal. There is no tenderness.  Musculoskeletal:       Right ankle: He exhibits no swelling.       Left ankle: He exhibits no  swelling.  Lymphadenopathy:    He has no cervical adenopathy.  Neurological: He is alert and oriented to person, place, and time. No cranial nerve deficit.  Skin: Skin is warm. No rash noted. Nails show no clubbing.  Psychiatric: He has a normal mood and affect.      Data Reviewed: Basic Metabolic Panel: Recent Labs  Lab 10/06/17 0402 10/07/17 0935 10/07/17 1433 10/07/17 2216 10/08/17 0620 10/09/17 0415  NA 128* 125* 124* 126* 131* 129*  K 4.1 4.2 4.9  --  4.5 4.6  CL 83* 83* 85*  --  91* 91*  CO2 36* 34* 30  --  37* 35*  GLUCOSE 179* 140* 130*  --  95 91  BUN 35* 34* 36*  --  40* 37*  CREATININE 1.12 1.04 1.05  --  1.20 0.84  CALCIUM 8.3* 8.6* 8.5*  --  8.3* 8.2*   Liver Function Tests: Recent Labs  Lab 10/04/17 1611  AST 21  ALT 14*  ALKPHOS 60  BILITOT 1.5*  PROT 6.7  ALBUMIN 3.7   CBC: Recent Labs  Lab 10/04/17 1611 10/05/17 0457  WBC 6.4  5.9 4.4  NEUTROABS 4.4  --   HGB 11.1*  11.1* 10.6*  HCT 33.1*  32.3* 31.2*  MCV 93.7  93.7 93.5  PLT 125*  133* 123*   Cardiac Enzymes: Recent Labs  Lab 10/04/17 1611  TROPONINI <0.03   BNP (last 3 results) Recent Labs  06/24/17 1530 10/04/17 1611  BNP 228.0* 221.0*     CBG: Recent Labs  Lab 10/04/17 1958  GLUCAP 80    Recent Results (from the past 240 hour(s))  Blood Culture (routine x 2)     Status: None   Collection Time: 10/04/17  4:11 PM  Result Value Ref Range Status   Specimen Description BLOOD RIGHT ANTECUBITAL  Final   Special Requests   Final    BOTTLES DRAWN AEROBIC AND ANAEROBIC Blood Culture adequate volume   Culture   Final    NO GROWTH 5 DAYS Performed at [facility], [address] [place], [place] [address]    Report Status 10/09/2017 FINAL  Final  Blood Culture (routine x 2)     Status: None   Collection Time: 10/04/17  4:36 PM  Result Value Ref Range Status   Specimen Description BLOOD BLOOD RIGHT FOREARM  Final   Special Requests   Final    BOTTLES  DRAWN AEROBIC AND ANAEROBIC Blood Culture adequate volume   Culture   Final    NO GROWTH 5 DAYS Performed at [facility], [address] [place], [place] [address]    Report Status 10/09/2017 FINAL  Final  MRSA PCR Screening     Status: None   Collection Time: 10/04/17  8:02 PM  Result Value Ref Range Status   MRSA by PCR NEGATIVE NEGATIVE Final    Comment:        The GeneXpert MRSA Assay (FDA approved for NASAL specimens only), is one component of a comprehensive MRSA colonization surveillance program. It is not intended to diagnose MRSA infection nor to guide or monitor treatment for MRSA infections. Performed at [facility], [address] [place], [place] [address]      Studies: Ct Chest W Contrast  Result Date: 10/09/2017 CLINICAL DATA:  Acute hypercapnic respiratory failure. Clinical concern for pneumonia. EXAM: CT CHEST WITH CONTRAST TECHNIQUE: Multidetector CT imaging of the chest was performed during intravenous contrast administration. CONTRAST:  75mL ISOVUE-300 IOPAMIDOL (ISOVUE-300) INJECTION 61% COMPARISON:  Single-view chest dated 10/04/2017 and chest CTA dated 09/09/2017. FINDINGS: Cardiovascular: Atheromatous calcifications, including the coronary arteries and aorta. Enlarged central pulmonary arteries. The main pulmonary artery has a transverse diameter of 4.4 cm. Mildly enlarged heart. Mediastinum/Nodes: No enlarged mediastinal, hilar, or axillary lymph nodes. Thyroid gland, trachea, and esophagus demonstrate no significant findings. Lungs/Pleura: Interval small amount of patchy opacity in the posterior aspect of the right upper lobe. Also demonstrated is elevation of the right hemidiaphragm with atelectasis of the adjacent portions of the right lower lobe and right middle lobe. Small amount of linear scarring in the superior aspect of the right upper lobe. No pleural fluid. Upper Abdomen: Elevated right hemidiaphragm with colon interposition. 2.9  cm gallstone in the gallbladder and multiple additional smaller gallstones. No gallbladder wall thickening or pericholecystic fluid. The upper portion of an aortic stent is included. Musculoskeletal: Mild thoracic spine degenerative changes. IMPRESSION: 1. Mild right upper lobe pneumonia. 2. Elevated right hemidiaphragm with compressive atelectasis in the adjacent right lower lobe and right middle lobe. 3.  Calcific coronary artery and aortic atherosclerosis. 4. Enlarged central pulmonary arteries, compatible with pulmonary arterial hypertension. 5. Cholelithiasis. Aortic Atherosclerosis (ICD10-I70.0). Electronically Signed   By: Beckie Salts M.D.   On: 10/09/2017 12:25    Scheduled Meds: . acetylcysteine  600 mg Oral BID  . albuterol  5 mg Nebulization Once  . aspirin EC  81 mg Oral Daily  .  clopidogrel  75 mg Oral Daily  . docusate sodium  200 mg Oral BID  . enoxaparin (LOVENOX) injection  40 mg Subcutaneous Q24H  . fluticasone furoate-vilanterol  1 puff Inhalation Daily  . folic acid  1 mg Oral Daily  . guaiFENesin  600 mg Oral BID  . lactulose  30 g Oral BID  . levofloxacin  500 mg Oral Daily  . levothyroxine  200 mcg Oral QAC breakfast  . methylPREDNISolone (SOLU-MEDROL) injection  40 mg Intravenous Daily  . multivitamin with minerals  1 tablet Oral Daily  . polyethylene glycol  17 g Oral Daily  . sertraline  100 mg Oral Daily  . sodium chloride flush  3 mL Intravenous Q12H  . tiotropium  18 mcg Inhalation Daily   Continuous Infusions: . sodium chloride      Assessment/Plan:  1. Acute hypercapnic respiratory failure.  Patient initially required BiPAP to blow off CO2.  Patient down to nasal cannula at this point.  Slowly improving will likely need oxygen on discharge, continue to complain of symptoms on the right lung diet ordered a CT scan which shows possible right-sided pneumonia however procalcitonin level is normal will place him on oral antibiotics 2. COPD exacerbation.   Continue solumedrol 3. Hyponatremia.  Improved with tolvaptan I have stopped tolvaptan have asked patient to fluid restrict 4. Weakness and falls.  Physical therapy evaluation.  Goal is to go home with home health   5. Orthostatic hypotension.  Recheck orthostatics tomorrow morning 6. Depression on Zoloft 7. Hypothyroidism unspecified on levothyroxine 8. History of diastolic congestive heart failure.  No signs at this time.  Watch closely with IV fluid hydration.   Code Status:     Code Status Orders  (From admission, onward)        Start     Ordered   10/04/17 2026  Do not attempt resuscitation (DNR)  Continuous    Question Answer Comment  In the event of cardiac or respiratory ARREST Do not call a "code blue"   In the event of cardiac or respiratory ARREST Do not perform Intubation, CPR, defibrillation or ACLS   In the event of cardiac or respiratory ARREST Use medication by any route, position, wound care, and other measures to relive pain and suffering. May use oxygen, suction and manual treatment of airway obstruction as needed for comfort.      10/04/17 2025    Code Status History    Date Active Date Inactive Code Status Order ID Comments User Context   10/04/2017 1758 10/04/2017 2025 DNR [id number]  Ihor Austin, MD ED   09/13/2017 0657 09/13/2017 1547 DNR [id number]  Milagros Loll, MD Inpatient   09/09/2017 1333 09/13/2017 0657 Full Code [id number]  Ramonita Lab, MD ED   06/24/2017 2105 06/27/2017 1732 Full Code [id number]  Houston Siren, MD Inpatient   09/26/2016 0852 09/28/2016 2146 Full Code [id number]  Shaune Pollack, MD Inpatient   09/13/2016 2229 09/16/2016 1551 Full Code [id number]  Oralia Manis, MD ED   09/09/2016 0911 09/10/2016 1632 Full Code [id number]  Nada Libman, MD Inpatient   08/20/2016 0206 08/23/2016 2138 Full Code [id number]  Hugelmeyer, Alexis, DO Inpatient   07/16/2016 1312 07/27/2016 2018 Full Code [id number]  Erin Fulling, MD ED   03/09/2016 1720 03/15/2016 2206 Full Code  [id number]  Heide Spark Inpatient   03/03/2016 1809 03/09/2016 1604 Full Code [id number]  Erin Fulling, MD ED   02/27/2016 2241 02/29/2016 1953 Full Code [id number]  Oralia Manis, MD Inpatient  07/22/2015 1930 07/26/2015 0016 Full Code [id number]  Adrian SaranMody, Sital, MD Inpatient   02/11/2015 1028 02/11/2015 1815 Full Code [id number]  Nada LibmanBrabham, Vance W, MD Inpatient   08/04/2014 2013 08/13/2014 1453 Full Code [id number]  Lynden OxfordPatel, Pranav, MD Inpatient    Advance Directive Documentation     Most Recent Value  Type of Advance Directive  Healthcare Power of Attorney, Living will  Pre-existing out of facility DNR order (yellow form or pink MOST form)  -  "MOST" Form in Place?  -     Disposition Plan: Hopefully home with home health.  Time spent: 26 minutes  Matie Dimaano [employer]  [employer]

## 2017-10-09 NOTE — Consult Note (Signed)
Reason for Consult:Dr. Posey Pronto Referring Physician: COPD  Chad Huffman is an 72 y.o. male.  HPI: Chad Huffman is a very pleasant 72 year old gentleman with a past medical history remarkable for degenerative disc disease, hypertension, peripheral neuropathy, congestive heart failure, carotid disease, COPD. He is followed by Dr. Jamal Collin in the outpatient setting. He is on 2 L nasal cannula at night, uses Symbicort with rescue nebulizer therapy. He states he developed a chest cold several weeks ago and had subsequently progressive lower extremity edema, cough, congestion, wheezing and was admitted to the hospital. Patient is resting comfortably at this time, states that his breathing has significantly improved.  Past Medical History:  Diagnosis Date  . Antalgic gait   . Carotid artery occlusion   . CHF (congestive heart failure) ([place])   . Chronic neck and back pain   . Complication of anesthesia    pt has had cervical fusion-limited neck flextion  . COPD (chronic obstructive pulmonary disease) ([place])   . Depression   . DJD (degenerative joint disease)    Right shoulder  . Hypertension   . Lumbago   . Peripheral neuropathy     Past Surgical History:  Procedure Laterality Date  . ABDOMINAL AORTIC ANEURYSM REPAIR  2009   Endovascular AAA repair  . ABDOMINAL AORTIC ANEURYSM REPAIR    . CAROTID ANGIOGRAPHY  09/09/2016   Procedure: Carotid Angiography;  Surgeon: Serafina Mitchell, MD;  Location: [place] [facility];  Service: Cardiovascular;;  rt. carotid  . CARPAL TUNNEL RELEASE     bilateral CTS  . ESOPHAGOGASTRODUODENOSCOPY (EGD) WITH PROPOFOL N/A 09/28/2016   Procedure: ESOPHAGOGASTRODUODENOSCOPY (EGD) WITH PROPOFOL;  Surgeon: Lucilla Lame, MD;  Location: [facility];  Service: Endoscopy;  Laterality: N/A;  . HAMMER TOE SURGERY  12/10/2011   Procedure: HAMMER TOE CORRECTION;  Surgeon: Tyson Dense, DPM;  Location: [place];  Service: Podiatry;  Laterality: Right;  Right hammertoe  repair second right with 2.5x42 orthopro screw   . IR GENERIC HISTORICAL  04/05/2016   IR GASTROSTOMY TUBE MOD SED 04/05/2016 [facility]  . IR GENERIC HISTORICAL  05/12/2016   IR CM INJ ANY COLONIC TUBE W/FLUORO 05/12/2016 Jacqulynn Cadet, MD [facility]  . IR GENERIC HISTORICAL  05/26/2016   IR CM INJ ANY COLONIC TUBE W/FLUORO 05/26/2016 [facility]  . IR GENERIC HISTORICAL  05/28/2016   IR [place] GASTRO/COLONIC TUBE PERCUT W/FLUORO 05/28/2016 Ascencion Dike, PA-C [facility]  . IR GENERIC HISTORICAL  07/02/2016   IR GASTROSTOMY TUBE REMOVAL 07/02/2016 Ascencion Dike, PA-C [facility]  . METATARSAL OSTEOTOMY  12/10/2011   Procedure: METATARSAL OSTEOTOMY;  Surgeon: Tyson Dense, DPM;  Location: [place];  Service: Podiatry;  Laterality: Right;  Right Keller arthroplasty with size 5 silicone implant right great toe; second metatarsal osteotomy with 2.0x14 screw  . NECK SURGERY    . PERIPHERAL VASCULAR CATHETERIZATION N/A 02/11/2015   Procedure: Carotid Angiography;  Surgeon: Serafina Mitchell, MD;  Location: [place] [facility];  Service: Cardiovascular;  Laterality: N/A;  . PILONIDAL CYST / SINUS EXCISION    . Rotary cuff    . SPINE SURGERY  09/15/11   Scar tissue removed- back  . SPINE SURGERY  09/15/11   Scar tissue removed- back  . TONSILLECTOMY    . TRACHEOSTOMY TUBE PLACEMENT N/A 03/03/2016   Procedure: TRACHEOSTOMY;  Surgeon: Clyde Canterbury, MD;  Location: [facility];  Service: ENT;  Laterality: N/A;    Family History  Problem Relation Age of Onset  .  Stomach cancer Mother   . Hypertension Brother   . Heart attack Father     Social History:  reports that he quit smoking about 11 years ago. His smoking use included cigarettes. He has a 30.00 pack-year smoking history. He has never used smokeless tobacco. He reports that he drinks about 1.2 oz of alcohol per week. He reports that he does not use drugs.  Allergies:  Allergies  Allergen Reactions  . Ampicillin Swelling  and Other (See Comments)    Unable to obtain enough information to answer additional questions about this medication.  Has patient had a PCN reaction causing immediate rash, facial/tongue/throat swelling, SOB or lightheadedness with hypotension: Yes Has patient had a PCN reaction causing severe rash involving mucus membranes or skin necrosis: No Has patient had a PCN reaction that required hospitalization No Has patient had a PCN reaction occurring within the last 10 years: Yes If all of the above answers are "NO", then may pr  . Nortriptyline Other (See Comments)    Sleep walking  . Brimonidine Other (See Comments)    Unknown reaction per MAR   . Cyclobenzaprine Other (See Comments)    Hallucinations  . Tizanidine Other (See Comments)    Hallucinations  . Gabapentin Rash    Medications: I have reviewed the patient's current medications.  Results for orders placed or performed during the hospital encounter of 10/04/17 (from the past 48 hour(s))  Basic metabolic panel     Status: Abnormal   Collection Time: 10/07/17  9:35 AM  Result Value Ref Range   Sodium 125 (L) 135 - 145 mmol/L   Potassium 4.2 3.5 - 5.1 mmol/L   Chloride 83 (L) 101 - 111 mmol/L   CO2 34 (H) 22 - 32 mmol/L   Glucose, Bld 140 (H) 65 - 99 mg/dL   BUN 34 (H) 6 - 20 mg/dL   Creatinine, Ser 1.04 0.61 - 1.24 mg/dL   Calcium 8.6 (L) 8.9 - 10.3 mg/dL   GFR calc non Af Amer >60 >60 mL/min   GFR calc Af Amer >60 >60 mL/min    Comment: (NOTE) The eGFR has been calculated using the CKD EPI equation. This calculation has not been validated in all clinical situations. eGFR's persistently <60 mL/min signify possible Chronic Kidney Disease.    Anion gap 8 5 - 15    Comment: Performed at [facility], [address] [place], [place] [address]  BASELINE basic metabolic panel - IF NOT already drawn     Status: Abnormal   Collection Time: 10/07/17  2:33 PM  Result Value Ref Range   Sodium 124 (L) 135 - 145 mmol/L    Potassium 4.9 3.5 - 5.1 mmol/L   Chloride 85 (L) 101 - 111 mmol/L   CO2 30 22 - 32 mmol/L   Glucose, Bld 130 (H) 65 - 99 mg/dL   BUN 36 (H) 6 - 20 mg/dL   Creatinine, Ser 1.05 0.61 - 1.24 mg/dL   Calcium 8.5 (L) 8.9 - 10.3 mg/dL   GFR calc non Af Amer >60 >60 mL/min   GFR calc Af Amer >60 >60 mL/min    Comment: (NOTE) The eGFR has been calculated using the CKD EPI equation. This calculation has not been validated in all clinical situations. eGFR's persistently <60 mL/min signify possible Chronic Kidney Disease.    Anion gap 9 5 - 15    Comment: Performed at [facility], [address] [place], [place] [address]  Sodium  Status: Abnormal   Collection Time: 10/07/17 10:16 PM  Result Value Ref Range   Sodium 126 (L) 135 - 145 mmol/L    Comment: Performed at [facility], [address] [place], [place] [address]  Basic metabolic panel     Status: Abnormal   Collection Time: 10/08/17  6:20 AM  Result Value Ref Range   Sodium 131 (L) 135 - 145 mmol/L   Potassium 4.5 3.5 - 5.1 mmol/L   Chloride 91 (L) 101 - 111 mmol/L   CO2 37 (H) 22 - 32 mmol/L   Glucose, Bld 95 65 - 99 mg/dL   BUN 40 (H) 6 - 20 mg/dL   Creatinine, Ser 1.20 0.61 - 1.24 mg/dL   Calcium 8.3 (L) 8.9 - 10.3 mg/dL   GFR calc non Af Amer 60 (L) >60 mL/min   GFR calc Af Amer >60 >60 mL/min    Comment: (NOTE) The eGFR has been calculated using the CKD EPI equation. This calculation has not been validated in all clinical situations. eGFR's persistently <60 mL/min signify possible Chronic Kidney Disease.    Anion gap 3 (L) 5 - 15    Comment: Performed at [facility], [address] [place], [place] [address]  Basic metabolic panel     Status: Abnormal   Collection Time: 10/09/17  4:15 AM  Result Value Ref Range   Sodium 129 (L) 135 - 145 mmol/L   Potassium 4.6 3.5 - 5.1 mmol/L   Chloride 91 (L) 101 - 111 mmol/L   CO2 35 (H) 22 - 32 mmol/L   Glucose, Bld 91 65 - 99 mg/dL    BUN 37 (H) 6 - 20 mg/dL   Creatinine, Ser 0.84 0.61 - 1.24 mg/dL   Calcium 8.2 (L) 8.9 - 10.3 mg/dL   GFR calc non Af Amer >60 >60 mL/min   GFR calc Af Amer >60 >60 mL/min    Comment: (NOTE) The eGFR has been calculated using the CKD EPI equation. This calculation has not been validated in all clinical situations. eGFR's persistently <60 mL/min signify possible Chronic Kidney Disease.    Anion gap 3 (L) 5 - 15    Comment: Performed at [facility], [address] [place], [place] [address]    No results found.  ROS Blood pressure (!) 152/67, pulse 93, temperature 97.8 F (36.6 C), temperature source Oral, resp. rate 18, height 5' 8"  (1.727 m), weight 227 lb 12.8 oz (103.3 kg), SpO2 92 %. Physical Exam  Patient is awake, alert, oriented in no acute distress Presently on 2 L nasal cannula HEENT: No oral lesions appreciated, trachea is midline, no thyromegaly noted, no stridor, no accessory muscle utilization Cardiovascular: Regular rate and rhythm Pulmonary: Mild expiratory rhonchi appreciated, diminished breath sounds Abdominal: Positive bowel sounds, soft exam Extremities: Some lower extremity edema with discoloration in the left leg Neurologic: No focal deficits appreciated Cutaneous: Lower extremity discoloration most likely from chronic edema  Assessment/Plan: COPD exacerbation. Patient has significantly improved since admission. Would recommend slow prednisone taper, Spiriva with Symbicort and rescue albuterol, continue supplemental oxygen and follow-up to see Dr. 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 Sodium is 129  Frequent falls. Being addressed by primary care team  Depression on Zoloft  Hypothyroidism on levothyroxine  History of heart failure with preserved ejection fraction  No additional recommendations at this time  Hermelinda Dellen, [place] 10/09/2017, 9:16 AM

## 2017-10-10 LAB — OSMOLALITY: Osmolality: 275 mOsm/kg (ref 275–295)

## 2017-10-10 LAB — BASIC METABOLIC PANEL
ANION GAP: 5 (ref 5–15)
BUN: 32 mg/dL — ABNORMAL HIGH (ref 6–20)
CALCIUM: 8.4 mg/dL — AB (ref 8.9–10.3)
CO2: 33 mmol/L — AB (ref 22–32)
Chloride: 89 mmol/L — ABNORMAL LOW (ref 101–111)
Creatinine, Ser: 1.03 mg/dL (ref 0.61–1.24)
GFR calc Af Amer: >60 mL/min (ref >60)
GFR calc non Af Amer: >60 mL/min (ref >60)
GLUCOSE: 83 mg/dL (ref 65–99)
Potassium: 4.5 mmol/L (ref 3.5–5.1)
Sodium: 127 mmol/L — ABNORMAL LOW (ref 135–145)

## 2017-10-10 MED ORDER — LEVOFLOXACIN 500 MG PO TABS: 500.0000 mg | ORAL_TABLET | Freq: Every day | ORAL | 0 refills | Status: AC

## 2017-10-10 MED ORDER — PREDNISONE 10 MG (21) PO TBPK: ORAL_TABLET | ORAL | 0 refills | Status: DC

## 2017-10-10 MED ORDER — GUAIFENESIN-DM 100-10 MG/5ML PO SYRP: 5.0000 mL | ORAL_SOLUTION | ORAL | 0 refills | Status: DC | PRN

## 2017-10-10 MED ORDER — SORBITOL 70 % SOLN
960.0000 mL | TOPICAL_OIL | Freq: Once | ORAL | Status: AC
  Administered 2017-10-10: 960 mL via RECTAL
  Filled 2017-10-10: qty 473

## 2017-10-10 MED ORDER — TIOTROPIUM BROMIDE MONOHYDRATE 18 MCG IN CAPS: 18.0000 ug | ORAL_CAPSULE | Freq: Every day | RESPIRATORY_TRACT | 12 refills | Status: AC

## 2017-10-10 MED ORDER — GUAIFENESIN ER 600 MG PO TB12: 600.0000 mg | ORAL_TABLET | Freq: Two times a day (BID) | ORAL | 0 refills | Status: AC

## 2017-10-10 NOTE — Discharge Summary (Signed)
[employer] [facility] [place] [facility], 72 y.o., DOB 08-Jul-1945, MRN [id number]. Admission date: 10/04/2017 Discharge Date 10/10/2017 Primary MD Dorothey Baseman, MD Admitting Physician Ihor Austin, MD  Admission Diagnosis  multiple falls  Discharge Diagnosis   Active Problems: Acute hypercarbic respiratory failure Acute on chronic COPD exasperation Hyponatremia Weakness and fall Orthostatic hypotension Depression Hypothyroidism Chronic diastolic CHF currently compensated  Hospital Course Aldin Bartnik  is a 72 y.o. male with a known history of congestive heart failure, COPD, chronic neck and back pain, degenerative joint disease, hypertension, peripheral neuropathy presented to the emergency room for fall and shortness of breath.  Patient initially had to be placed on BiPAP initially now is back on oxygen.  Patient was treated for acute on chronic COPD exasperation.  He was treated with nebulizer steroids.  He continued to be very symptomatic therefore had a repeat CT scan which showed some chronic changes in the lungs as well as the right mid lobe pneumonia.  His procalcitonin level was normal.  Patient was placed on oral antibiotics.  He was also noticed to have hypo-natremia.  He received 1 dose of tolvaptan and his sodium is again trending down.  He will need to follow-up with nephrology at that time her sodium level will need to be rechecked.  I will have ordered a serum and urine osmolality.  Patient will have home health nursing arranged.              Consults  pulmonary/intensive care  Significant Tests:  See full reports for all details    Dg Chest 1 View  Result Date: 10/04/2017 CLINICAL DATA:  Pain following fall EXAM: CHEST  1 VIEW COMPARISON:  Chest radiograph September 09, 2017 and chest CT September 09, 2017 FINDINGS: Mild elevation of the right hemidiaphragm is stable. There is mild bibasilar atelectasis. There is no edema or consolidation.  Heart is borderline enlarged with pulmonary vascularity within normal limits. No adenopathy. There is aortic atherosclerosis. Postoperative changes noted in the lower cervical and upper thoracic regions. There is advanced arthropathy in the left shoulder. 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 No pneumothorax. IMPRESSION: Mild bibasilar atelectasis. No edema or consolidation. Stable cardiac silhouette. There is aortic atherosclerosis. Aortic Atherosclerosis (ICD10-I70.0). Electronically Signed   By: Bretta Bang III M.D.   On: 10/04/2017 18:18   Dg Lumbar Spine Complete  Result Date: 10/04/2017 CLINICAL DATA:  Pain following fall EXAM: LUMBAR SPINE - COMPLETE 4+ VIEW COMPARISON:  June 13, 2017 FINDINGS: Frontal, lateral, spot lumbosacral lateral, and bilateral oblique views were obtained. The there are 5 non-rib-bearing lumbar type vertebral bodies. T12 ribs are hypoplastic. There is postoperative change with posterior screw and plate fixation at L4 and L5. There is a disc spacer at L4-5. There is no fracture. There is minimal retrolisthesis of L1 on L2 and minimal retrolisthesis of L2 on L3, stable. There is no new spondylolisthesis. There is disc space narrowing at L3-4 which has increased from prior study. Disc space narrowing at L4-5 and L5-S1 are stable. There is facet osteoarthritic change at L4-5 and L5-S1 bilaterally and at L3-4 on the left, similar to prior studies. There is a stent in the aorta and iliac arteries. IMPRESSION: Postoperative change at L4 and L5. No fracture. Slight retrolisthesis of L1 on L2 and L2 on L3, stable, likely due to spondylosis. Slight increase in disc space narrowing at L3-4. Stable disc space narrowing at L4-5 and L5-S1. Stable facet  osteoarthritic changes several levels. Stent in aorta and iliac arteries again noted. Electronically Signed   By: Bretta Bang III M.D.   On: 10/04/2017 18:17   Ct Head Wo Contrast  Result  Date: 10/04/2017 CLINICAL DATA:  72 y/o  M; multiple falls with head trauma. EXAM: CT HEAD WITHOUT CONTRAST CT CERVICAL SPINE WITHOUT CONTRAST TECHNIQUE: Multidetector CT imaging of the head and cervical spine was performed following the standard protocol without intravenous contrast. Multiplanar CT image reconstructions of the cervical spine were also generated. COMPARISON:  09/26/2017 CT head and cervical spine. 09/26/2017 MRI head. 02/23/2017 MRI cervical spine. FINDINGS: CT HEAD FINDINGS Brain: No evidence of acute infarction, hemorrhage, hydrocephalus, extra-axial collection or mass lesion/mass effect. 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 Stable chronic microvascular ischemic changes and parenchymal volume loss of the brain. Vascular: Extensive calcific atherosclerosis of carotid siphons and vertebral arteries. Skull: Mild soft tissue contusion to the midline parietal scalp. No calvarial fracture. Sinuses/Orbits: Partial right mastoid effusion.  Otherwise negative. Other: None. CT CERVICAL SPINE FINDINGS Alignment: Straightening of cervical lordosis without listhesis. Grade 1 C2-3 anterolisthesis. Skull base and vertebrae: There is fusion of the right atlantooccipital joint and left atlantoaxial joint. C1-C6 laminectomy. Fusion of the posterior elements to the occipital bone rods and facet screws hardware extending to the T2 level. No periprosthetic lucency or fracture identified. The facets are fused on the right from C2-C7 with persistent arthrosis at C7-T1. The facets on the left are fused from C2 to C5 with persistent arthrosis at C5-6 and below. Soft tissues and spinal canal: No prevertebral fluid or swelling. No visible canal hematoma. Disc levels: Uncovertebral and facet hypertrophy with bilateral foraminal stenosis at the C2 through C5 levels. Upper chest: Negative. Other: Negative.  IMPRESSION: CT head: 1. Mild soft tissue contusion in the midline parietal scalp. No calvarial fracture. No acute intracranial abnormality. 2. Stable chronic microvascular ischemic changes and parenchymal volume loss of the brain. 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 CT cervical spine: 1. No acute fracture or dislocation. 2. Occipital to T2 posterior spinal fusion. No apparent hardware related complication. Stable advanced degenerative changes of the cervical spine. Electronically Signed   By: Mitzi Hansen M.D.   On: 10/04/2017 18:12   Ct Chest W Contrast  Result Date: 10/09/2017 CLINICAL DATA:  Acute hypercapnic respiratory failure. Clinical concern for pneumonia. EXAM: CT CHEST WITH CONTRAST TECHNIQUE: Multidetector CT imaging of the chest was performed during intravenous contrast administration. CONTRAST:  75mL ISOVUE-300 IOPAMIDOL (ISOVUE-300) INJECTION 61% COMPARISON:  Single-view chest dated 10/04/2017 and chest CTA dated 09/09/2017. FINDINGS: Cardiovascular: Atheromatous calcifications, including the coronary arteries and aorta. Enlarged central pulmonary arteries. The main pulmonary artery has a transverse diameter of 4.4 cm. Mildly enlarged heart. Mediastinum/Nodes: No enlarged mediastinal, hilar, or axillary lymph nodes. Thyroid gland, trachea, and esophagus demonstrate no significant findings. Lungs/Pleura: Interval small amount of patchy opacity in the posterior aspect of the right upper lobe. Also demonstrated is elevation of the right hemidiaphragm with atelectasis of the adjacent portions of the right lower lobe and right middle lobe. Small amount of linear scarring in the superior aspect of the right upper lobe. No pleural fluid. Upper Abdomen: Elevated right hemidiaphragm with colon interposition. 2.9 cm gallstone in the gallbladder and multiple additional smaller gallstones. No gallbladder wall thickening or pericholecystic fluid. The upper  portion of an aortic stent is included. Musculoskeletal: Mild thoracic spine degenerative  changes. IMPRESSION: 1. Mild right upper lobe pneumonia. 2. Elevated right hemidiaphragm with compressive atelectasis in the adjacent right lower lobe and right middle lobe. 3.  Calcific coronary artery and aortic atherosclerosis. 4. Enlarged central pulmonary arteries, compatible with pulmonary arterial hypertension. 5. Cholelithiasis. Aortic Atherosclerosis (ICD10-I70.0). Electronically Signed   By: Beckie Salts M.D.   On: 10/09/2017 12:25   Ct Cervical Spine Wo Contrast  Result Date: 10/04/2017 CLINICAL DATA:  72 y/o  M; multiple falls with head trauma. EXAM: CT HEAD WITHOUT CONTRAST CT CERVICAL SPINE WITHOUT CONTRAST TECHNIQUE: Multidetector CT imaging of the head and cervical spine was performed following the standard protocol without intravenous contrast. Multiplanar CT image reconstructions of the cervical spine were also generated. COMPARISON:  09/26/2017 CT head and cervical spine. 09/26/2017 MRI head. 02/23/2017 MRI cervical spine. FINDINGS: CT HEAD FINDINGS Brain: No evidence of acute infarction, hemorrhage, hydrocephalus, extra-axial collection or mass lesion/mass effect. 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 Stable chronic microvascular ischemic changes and parenchymal volume loss of the brain. Vascular: Extensive calcific atherosclerosis of carotid siphons and vertebral arteries. Skull: Mild soft tissue contusion to the midline parietal scalp. No calvarial fracture. Sinuses/Orbits: Partial right mastoid effusion.  Otherwise negative. Other: None. CT CERVICAL SPINE FINDINGS Alignment: Straightening of cervical lordosis without listhesis. Grade 1 C2-3 anterolisthesis. Skull base and vertebrae: There is fusion of the right atlantooccipital joint and left atlantoaxial joint. C1-C6 laminectomy.  Fusion of the posterior elements to the occipital bone rods and facet screws hardware extending to the T2 level. No periprosthetic lucency or fracture identified. The facets are fused on the right from C2-C7 with persistent arthrosis at C7-T1. The facets on the left are fused from C2 to C5 with persistent arthrosis at C5-6 and below. Soft tissues and spinal canal: No prevertebral fluid or swelling. No visible canal hematoma. Disc levels: Uncovertebral and facet hypertrophy with bilateral foraminal stenosis at the C2 through C5 levels. Upper chest: Negative. Other: Negative. IMPRESSION: CT head: 1. Mild soft tissue contusion in the midline parietal scalp. No calvarial fracture. No acute intracranial abnormality. 2. Stable chronic microvascular ischemic changes and parenchymal volume loss of the brain. 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 CT cervical spine: 1. No acute fracture or dislocation. 2. Occipital to T2 posterior spinal fusion. No apparent hardware related complication. Stable advanced degenerative changes of the cervical spine. Electronically Signed   By: Mitzi Hansen M.D.   On: 10/04/2017 18:12   US Venous Img Lower Bilateral  Result Date: 10/05/2017 CLINICAL DATA:  Bilateral lower extremity swelling EXAM: BILATERAL LOWER EXTREMITY VENOUS DOPPLER ULTRASOUND TECHNIQUE: Gray-scale sonography with graded compression, as well as color Doppler and duplex ultrasound were performed to evaluate the lower extremity deep venous systems from the level of the common femoral vein and including the common femoral, femoral, profunda femoral, popliteal and calf veins including the posterior tibial, peroneal and gastrocnemius veins when visible. The superficial great saphenous vein was also interrogated. Spectral Doppler was utilized to evaluate flow at rest and with distal augmentation maneuvers in the common femoral, femoral and popliteal veins. COMPARISON:  09/09/2017  FINDINGS: RIGHT LOWER EXTREMITY Common Femoral Vein: No evidence of thrombus. Normal compressibility, respiratory phasicity and response to augmentation. Saphenofemoral Junction: No evidence of thrombus. Normal compressibility and flow on color Doppler imaging. Profunda Femoral Vein: No evidence of thrombus. Normal compressibility and flow on color Doppler imaging. Femoral Vein: No evidence of  thrombus. Normal compressibility, respiratory phasicity and response to augmentation. Popliteal Vein: No evidence of thrombus. Normal compressibility, respiratory phasicity and response to augmentation. Calf Veins: No evidence of thrombus. Normal compressibility and flow on color Doppler imaging. Superficial Great Saphenous Vein: No evidence of thrombus. Normal compressibility. Venous Reflux:  None. Other Findings:  None. LEFT LOWER EXTREMITY Common Femoral Vein: No evidence of thrombus. Normal compressibility, respiratory phasicity and response to augmentation. Saphenofemoral Junction: No evidence of thrombus. Normal compressibility and flow on color Doppler imaging. Profunda Femoral Vein: No evidence of thrombus. Normal compressibility and flow on color Doppler imaging. Femoral Vein: No evidence of thrombus. Normal compressibility, respiratory phasicity and response to augmentation. Popliteal Vein: No evidence of thrombus. Normal compressibility, respiratory phasicity and response to augmentation. Calf Veins: No evidence of thrombus. Normal compressibility and flow on color Doppler imaging. Superficial Great Saphenous Vein: No evidence of thrombus. Normal compressibility. Venous Reflux:  None. Other Findings:  None. IMPRESSION: No evidence of deep venous thrombosis. Electronically Signed   By: Alcide Clever M.D.   On: 10/05/2017 09:13       Today   Subjective:   Chad Huffman patient breathing much improved Objective:   Blood pressure (!) 164/78, pulse 73, temperature 97.8 F (36.6 C), temperature source Oral,  resp. rate (!) 147, height 5\' 8"  (1.727 m), weight 103.3 kg (227 lb 11.2 oz), SpO2 97 %.  .  Intake/Output Summary (Last 24 hours) at 10/10/2017 1152 Last data filed at 10/10/2017 0954 Gross per 24 hour  Intake 480 ml  Output 400 ml  Net 80 ml    Exam VITAL SIGNS: Blood pressure (!) 164/78, pulse 73, temperature 97.8 F (36.6 C), temperature source Oral, resp. rate (!) 147, height 5\' 8"  (1.727 m), weight 103.3 kg (227 lb 11.2 oz), SpO2 97 %.  GENERAL:  72 y.o.-year-old patient lying in the bed with no acute distress.  EYES: Pupils equal, round, reactive to light and accommodation. No scleral icterus. Extraocular muscles intact.  HEENT: Head atraumatic, normocephalic. Oropharynx and nasopharynx clear.  NECK:  Supple, no jugular venous distention. No thyroid enlargement, no tenderness.  LUNGS: Normal breath sounds bilaterally, no wheezing, rales,rhonchi or crepitation. No use of accessory muscles of respiration.  CARDIOVASCULAR: S1, S2 normal. No murmurs, rubs, or gallops.  ABDOMEN: Soft, nontender, nondistended. Bowel sounds present. No organomegaly or mass.  EXTREMITIES: No pedal edema, cyanosis, or clubbing.  NEUROLOGIC: Cranial nerves II through XII are intact. Muscle strength 5/5 in all extremities. Sensation intact. Gait not checked.  PSYCHIATRIC: The patient is alert and oriented x 3.  SKIN: No obvious rash, lesion, or ulcer.   Data Review     CBC w Diff:  Lab Results  Component Value Date   WBC 4.4 10/05/2017   HGB 10.6 (L) 10/05/2017   HGB 12.0 (L) 05/28/2014   HCT 31.2 (L) 10/05/2017   HCT 37.3 (L) 05/28/2014   PLT 123 (L) 10/05/2017   PLT 141 (L) 05/28/2014   LYMPHOPCT 11 10/04/2017   MONOPCT 12 10/04/2017   EOSPCT 1 10/04/2017   BASOPCT 1 10/04/2017   CMP:  Lab Results  Component Value Date   NA 127 (L) 10/10/2017   NA 139 06/10/2016   NA 130 (L) 05/28/2014   K 4.5 10/10/2017   K 3.7 05/28/2014   CL 89 (L) 10/10/2017   CL 90 (L) 05/28/2014   CO2 33 (H)  10/10/2017   CO2 34 (H) 05/28/2014   BUN 32 (H) 10/10/2017   BUN 14 06/10/2016  BUN 8 05/28/2014   CREATININE 1.03 10/10/2017   CREATININE 1.00 05/28/2014   GLU 87 06/10/2016   PROT 6.7 10/04/2017   PROT 7.1 05/28/2014   ALBUMIN 3.7 10/04/2017   ALBUMIN 3.4 05/28/2014   BILITOT 1.5 (H) 10/04/2017   BILITOT 1.1 (H) 05/28/2014   ALKPHOS 60 10/04/2017   ALKPHOS 85 05/28/2014   AST 21 10/04/2017   AST 25 05/28/2014   ALT 14 (L) 10/04/2017   ALT 20 05/28/2014  .  Micro Results Recent Results (from the past 240 hour(s))  Blood Culture (routine x 2)     Status: None   Collection Time: 10/04/17  4:11 PM  Result Value Ref Range Status   Specimen Description BLOOD RIGHT ANTECUBITAL  Final   Special Requests   Final    BOTTLES DRAWN AEROBIC AND ANAEROBIC Blood Culture adequate volume   Culture   Final    NO GROWTH 5 DAYS Performed at [facility], [address] [place], [place] [address]    Report Status 10/09/2017 FINAL  Final  Blood Culture (routine x 2)     Status: None   Collection Time: 10/04/17  4:36 PM  Result Value Ref Range Status   Specimen Description BLOOD BLOOD RIGHT FOREARM  Final   Special Requests   Final    BOTTLES DRAWN AEROBIC AND ANAEROBIC Blood Culture adequate volume   Culture   Final    NO GROWTH 5 DAYS Performed at [facility], [address] [place], [place] [address]    Report Status 10/09/2017 FINAL  Final  MRSA PCR Screening     Status: None   Collection Time: 10/04/17  8:02 PM  Result Value Ref Range Status   MRSA by PCR NEGATIVE NEGATIVE Final    Comment:        The GeneXpert MRSA Assay (FDA approved for NASAL specimens only), is one component of a comprehensive MRSA colonization surveillance program. It is not intended to diagnose MRSA infection nor to guide or monitor treatment for MRSA infections. Performed at [facility], [address] [place], [place] [address]         Code Status Orders   (From admission, onward)        Start     Ordered   10/04/17 2026  Do not attempt resuscitation (DNR)  Continuous    Question Answer Comment  In the event of cardiac or respiratory ARREST Do not call a "code blue"   In the event of cardiac or respiratory ARREST Do not perform Intubation, CPR, defibrillation or ACLS   In the event of cardiac or respiratory ARREST Use medication by any route, position, wound care, and other measures to relive pain and suffering. May use oxygen, suction and manual treatment of airway obstruction as needed for comfort.      10/04/17 2025    Code Status History    Date Active Date Inactive Code Status Order ID Comments User Context   10/04/2017 1758 10/04/2017 2025 DNR [id number]  Ihor AustinPyreddy, Pavan, MD ED   09/13/2017 0657 09/13/2017 1547 DNR [id number]  Milagros LollSudini, Srikar, MD Inpatient   09/09/2017 1333 09/13/2017 0657 Full Code [id number]  Ramonita LabGouru, Aruna, MD ED   06/24/2017 2105 06/27/2017 1732 Full Code [id number]  Houston SirenSainani, Vivek J, MD Inpatient   09/26/2016 0852 09/28/2016 2146 Full Code [id number]  Shaune Pollackhen, Qing, MD Inpatient   09/13/2016 2229 09/16/2016 1551 Full Code [id number]  Oralia ManisWillis, [name], MD ED   09/09/2016 0911 09/10/2016 1632 Full Code  [id number]  Nada Libman, MD Inpatient   08/20/2016 0206 08/23/2016 2138 Full Code [id number]  Tonye Royalty, DO Inpatient   07/16/2016 1312 07/27/2016 2018 Full Code [id number]  Erin Fulling, MD ED   03/09/2016 1720 03/15/2016 2206 Full Code [id number]  Heide Spark Inpatient   03/03/2016 1809 03/09/2016 1604 Full Code [id number]  Erin Fulling, MD ED   02/27/2016 2241 02/29/2016 1953 Full Code [id number]  Oralia Manis, MD Inpatient   07/22/2015 1930 07/26/2015 0016 Full Code [id number]  Adrian Saran, MD Inpatient   02/11/2015 1028 02/11/2015 1815 Full Code [id number]  Nada Libman, MD Inpatient   08/04/2014 2013 08/13/2014 1453 Full Code [id number]  Lynden Oxford, MD Inpatient    Advance Directive Documentation     Most Recent Value  Type of Advance Directive   Healthcare Power of Attorney  Pre-existing out of facility DNR order (yellow form or pink MOST form)  -  "MOST" Form in Place?  -          Follow-up Information    Dorothey Baseman, MD Follow up in 6 day(s).   Specialty:  Family Medicine Why:  hosp f/u Contact information: [address] [place] [address] [phone]        Mady Haagensen, MD Follow up in 1 week(s).   Specialty:  Internal Medicine Why:  hoponatremia Contact information: [address] [place] [address] [phone]        Merwyn Katos, MD On 10/24/2017.   Specialty:  Pulmonary Disease Why:  at 3:30 p.m. for copd f/u Contact information: [address] [place] [address] [phone]           Discharge Medications   Allergies as of 10/10/2017      Reactions   Ampicillin Swelling, Other (See Comments)   Unable to obtain enough information to answer additional questions about this medication.  Has patient had a PCN reaction causing immediate rash, facial/tongue/throat swelling, SOB or lightheadedness with hypotension: Yes Has patient had a PCN reaction causing severe rash involving mucus membranes or skin necrosis: No Has patient had a PCN reaction that required hospitalization No Has patient had a PCN reaction occurring within the last 10 years: Yes If all of the above answers are "NO", then may pr   Nortriptyline Other (See Comments)   Sleep walking   Brimonidine Other (See Comments)   Unknown reaction per [facility]    Cyclobenzaprine Other (See Comments)   Hallucinations   Tizanidine Other (See Comments)   Hallucinations   Gabapentin Rash      Medication List    STOP taking these medications   hydrochlorothiazide 25 MG tablet Commonly known as:  HYDRODIURIL     TAKE these medications   amLODipine 5 MG tablet Commonly known as:  NORVASC Take 5 mg by mouth daily.   aspirin EC 81 MG tablet Take 81 mg by mouth daily.   baclofen 10 MG  tablet Commonly known as:  LIORESAL Take 10 mg by mouth 2 (two) times daily as needed for muscle spasms.   budesonide 0.5 MG/2ML nebulizer solution Commonly known as:  PULMICORT Take 2 mLs (0.5 mg total) by nebulization 2 (two) times daily.   clopidogrel 75 MG tablet Commonly known as:  PLAVIX TAKE 1 TABLET BY MOUTH EVERY DAY   furosemide 20 MG tablet Commonly known as:  LASIX Take 1 tablet (20 mg total) by mouth daily.   guaiFENesin 600 MG 12 hr tablet Commonly known  as:  MUCINEX Take 1 tablet (600 mg total) by mouth 2 (two) times daily for 7 days.   guaiFENesin-dextromethorphan 100-10 MG/5ML syrup Commonly known as:  ROBITUSSIN DM Take 5 mLs by mouth every 4 (four) hours as needed for cough (chest congestion).   HYDROcodone-acetaminophen 5-325 MG tablet Commonly known as:  NORCO/VICODIN Take 1 tablet by mouth every 4 (four) hours as needed.   ipratropium-albuterol 0.5-2.5 (3) MG/3ML Soln Commonly known as:  DUONEB Take 3 mLs by nebulization every 6 (six) hours as needed. Dx:J44.9   Krill Oil Omega-3 500 MG Caps Take 1 tablet by mouth daily.   levofloxacin 500 MG tablet Commonly known as:  LEVAQUIN Take 1 tablet (500 mg total) by mouth daily for 4 days. Start taking on:  10/11/2017   levothyroxine 200 MCG tablet Commonly known as:  SYNTHROID, LEVOTHROID Take 200 mcg by mouth daily before breakfast.   metoprolol tartrate 25 MG tablet Commonly known as:  LOPRESSOR Take 1 tablet (25 mg total) by mouth 2 (two) times daily.   multivitamin with minerals Tabs tablet Take 1 tablet by mouth daily.   OXYGEN 4 L/min by Intratracheal route as needed (for shortness of breath).   predniSONE 10 MG (21) Tbpk tablet Commonly known as:  STERAPRED UNI-PAK 21 TAB Start 60mg  taper by 10mg  until complete   sertraline 100 MG tablet Commonly known as:  ZOLOFT Take 1 tablet (100 mg total) by mouth daily.   SYMBICORT 160-4.5 MCG/ACT inhaler Generic drug:   budesonide-formoterol TAKE 2 PUFFS BY MOUTH TWICE A DAY   tiotropium 18 MCG inhalation capsule Commonly known as:  SPIRIVA Place 1 capsule (18 mcg total) into inhaler and inhale daily. Start taking on:  10/11/2017            Durable Medical Equipment  (From admission, onward)        Start     Ordered   10/10/17 0946  DME Oxygen  Once    Question Answer Comment  Mode or (Route) Nasal cannula   Liters per Minute 4   Frequency Continuous (stationary and portable oxygen unit needed)   Oxygen delivery system Gas      10/10/17 0945         Total Time in preparing paper work, data evaluation and todays exam - 35 minutes  Auburn Bilberry M.D on 10/10/2017 at 11:52 AM [facility]   Office  [phone]

## 2017-10-10 NOTE — Care Management Important Message (Signed)
Important Message  Patient Details  Name: Chad Huffman MRN: [id number] Date of Birth: 17-Apr-1946   Medicare Important Message Given:  Yes    [name] Junio 10/10/2017, 11:53 AM

## 2017-10-10 NOTE — Discharge Planning (Signed)
Patient IV removed.  RN assessment and VS revealed stability for DC to [facility]  Discharge papers given, explained and educated.  Told of suggested FU appts - [facility] (Both offices stated they would contact patient to set up needed appts.)  Scripts escribed to pharm.  Once ready, will be wheeled to front and family transporting home via car.

## 2017-10-10 NOTE — Care Management Important Message (Signed)
Important Message  Patient Details  Name: Chad Huffman MRN: [id number] Date of Birth: 1946-05-16   Medicare Important Message Given:  Yes    Olegario MessierKathy A Cadee Agro 10/10/2017, 11:36 AM

## 2017-10-10 NOTE — Care Management (Signed)
Discharge to home today per Dr. Allena KatzPatel. Discussed Home Health agencies again. [facility]. [facility] Cancelled Family/friend will transport Chad GreetBrenda S Emelda Kohlbeck RN MSN CCM Care Management [phone]

## 2017-10-13 ENCOUNTER — Telehealth: Payer: Self-pay

## 2017-10-13 NOTE — Telephone Encounter (Signed)
EMMI Follow-up: Called patient as he had some questions regarding his discharge papers.  He said he has reviewed his discharge paperwork, made follow-up appointments and seen Dr. Cherylann RatelLateef on Wednesday.  The swelling has gone down in both legs and feels better. Said he received good care here but one complaint was the vents blow directly down on the patient and you are either to cold or too hot.  Wondered if deflectors could be placed on those vents. Will share suggestion with leadership.  Explained he would receive another automated call and to let us know if he had any further questions. No other needs at this time.

## 2017-10-19 IMAGING — CR DG CERVICAL SPINE COMPLETE 4+V
5 series · 5 of 5 positions shown · non-contrast
Comparison: Cervical spine CT 07/22/2015 E

CLINICAL DATA: Fell 1 week ago. Pain radiating to legs. Posterior
cervical fusion

EXAM:
CERVICAL SPINE - COMPLETE 4+ VIEW

[c-spine lat]
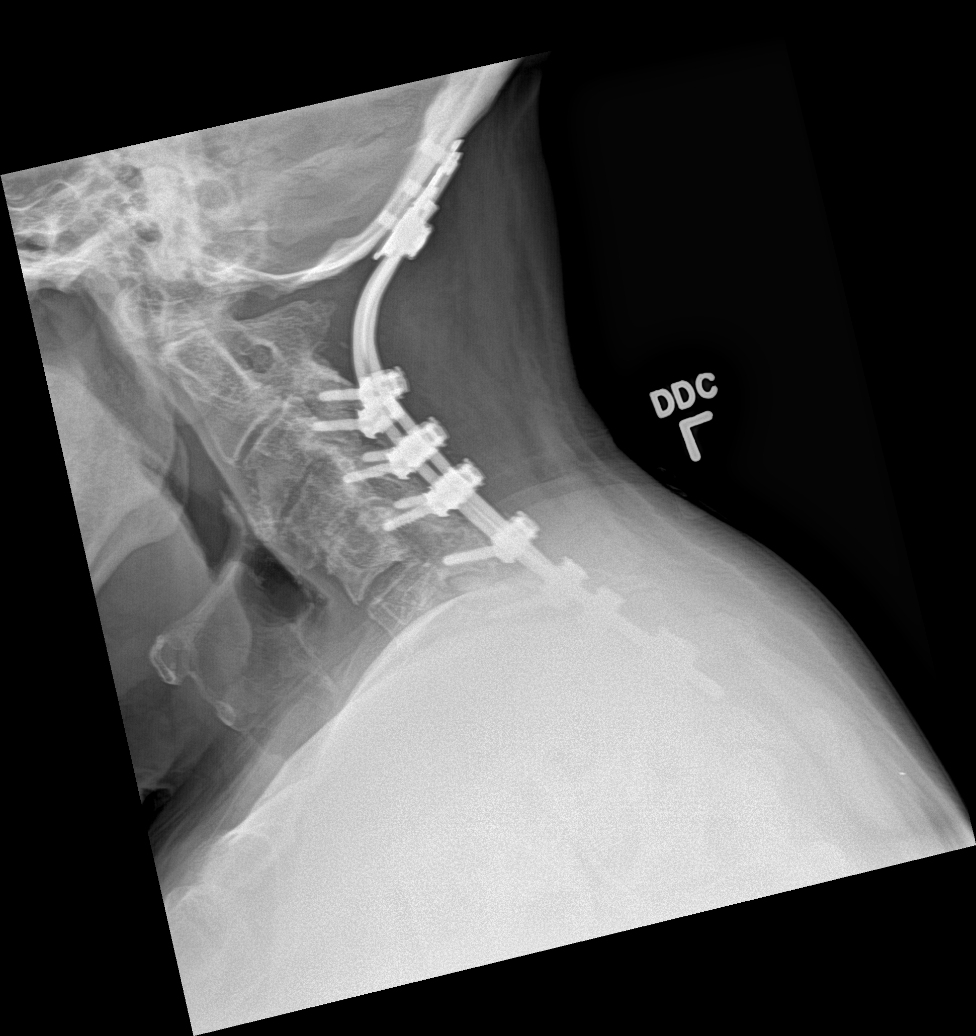

[c-spine obl (1 of 2)]
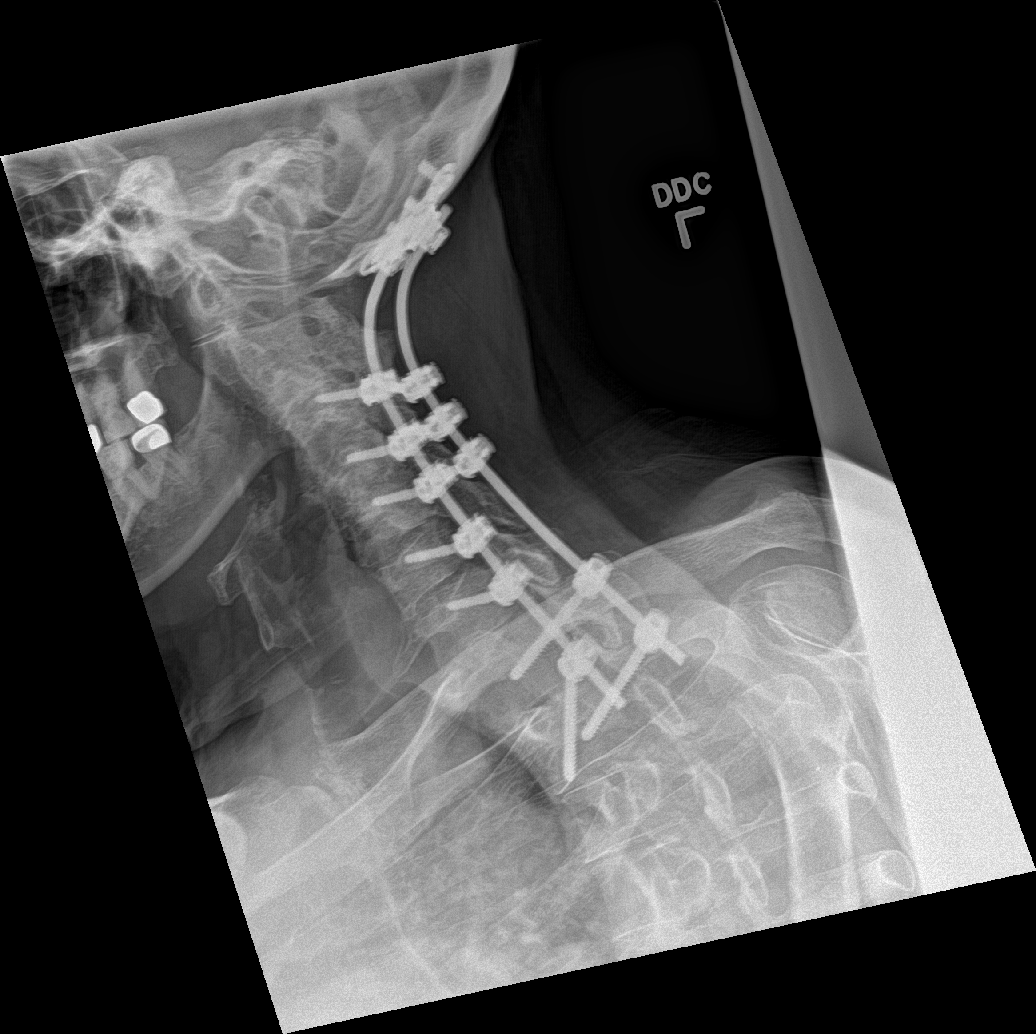

[c-spine obl (2 of 2)]
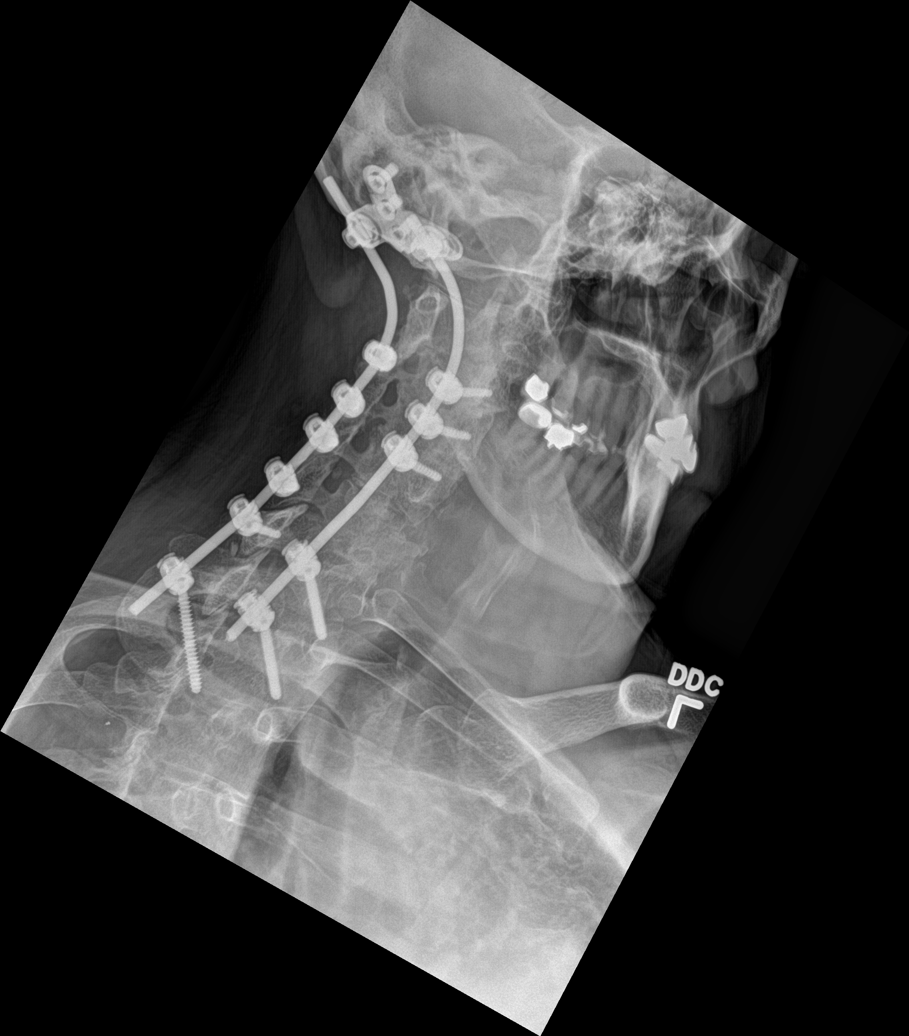

[c-spine ap]
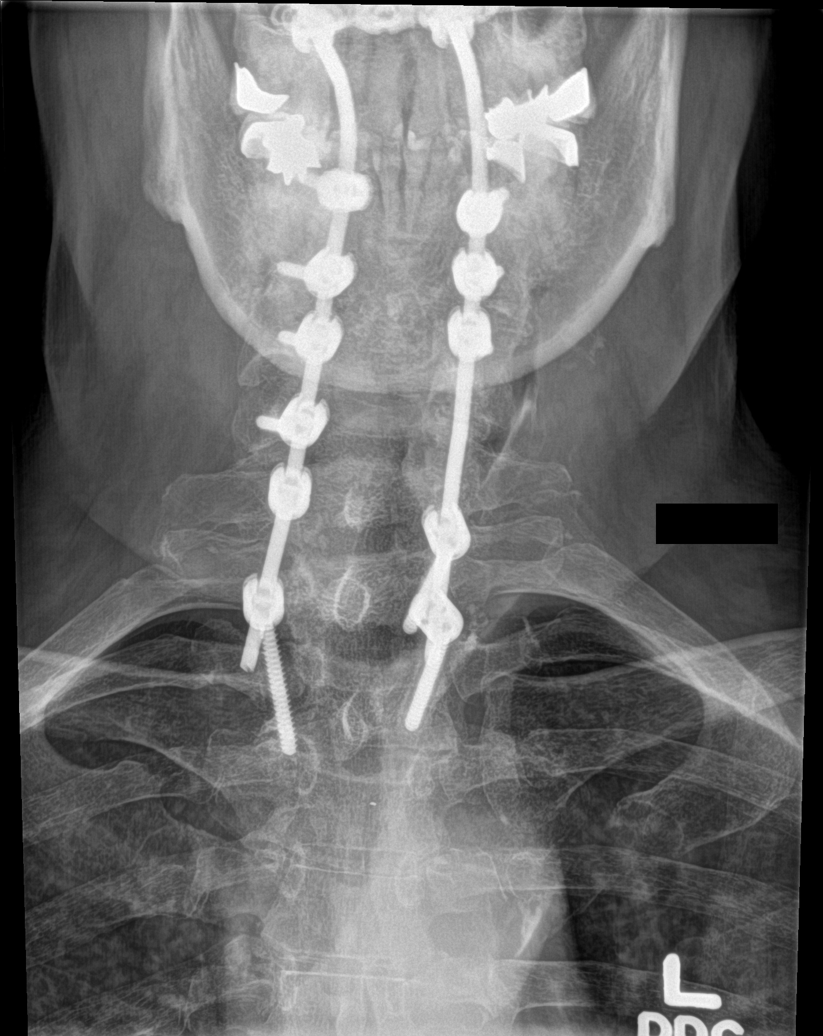

[[person_name]]
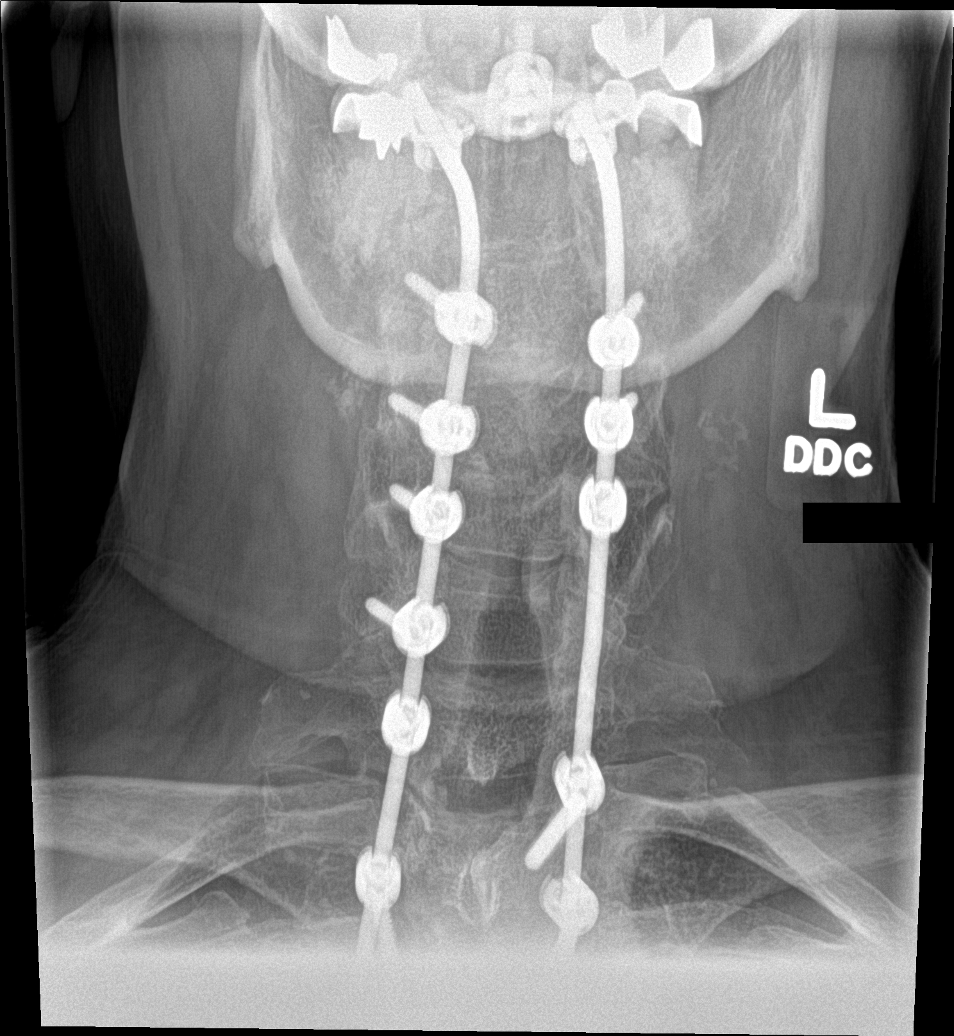

[5 of 5 positions shown; findings below may reference images not displayed]

FINDINGS: Posterior cervical fusion extending from the occipital bone through
the upper thoracic spine. Lateral projection reveals C2 through C5
vertebral bodies with no subluxation identified. Degenerate spurring
and endplate narrowing from C3 through C5. Lateral projections
demonstrate no hardware failure or subluxation.
IMPRESSION: No acute findings of the cervical spine.

Posterior fusion without complicating features.

## 2017-10-19 IMAGING — CR DG LUMBAR SPINE 2-3V
3 series · 3 of 3 positions shown · non-contrast
Comparison: 04/23/2015

CLINICAL DATA: Fall  low back pain

EXAM:
LUMBAR SPINE - 2-3 VIEW

[l-spine ap]
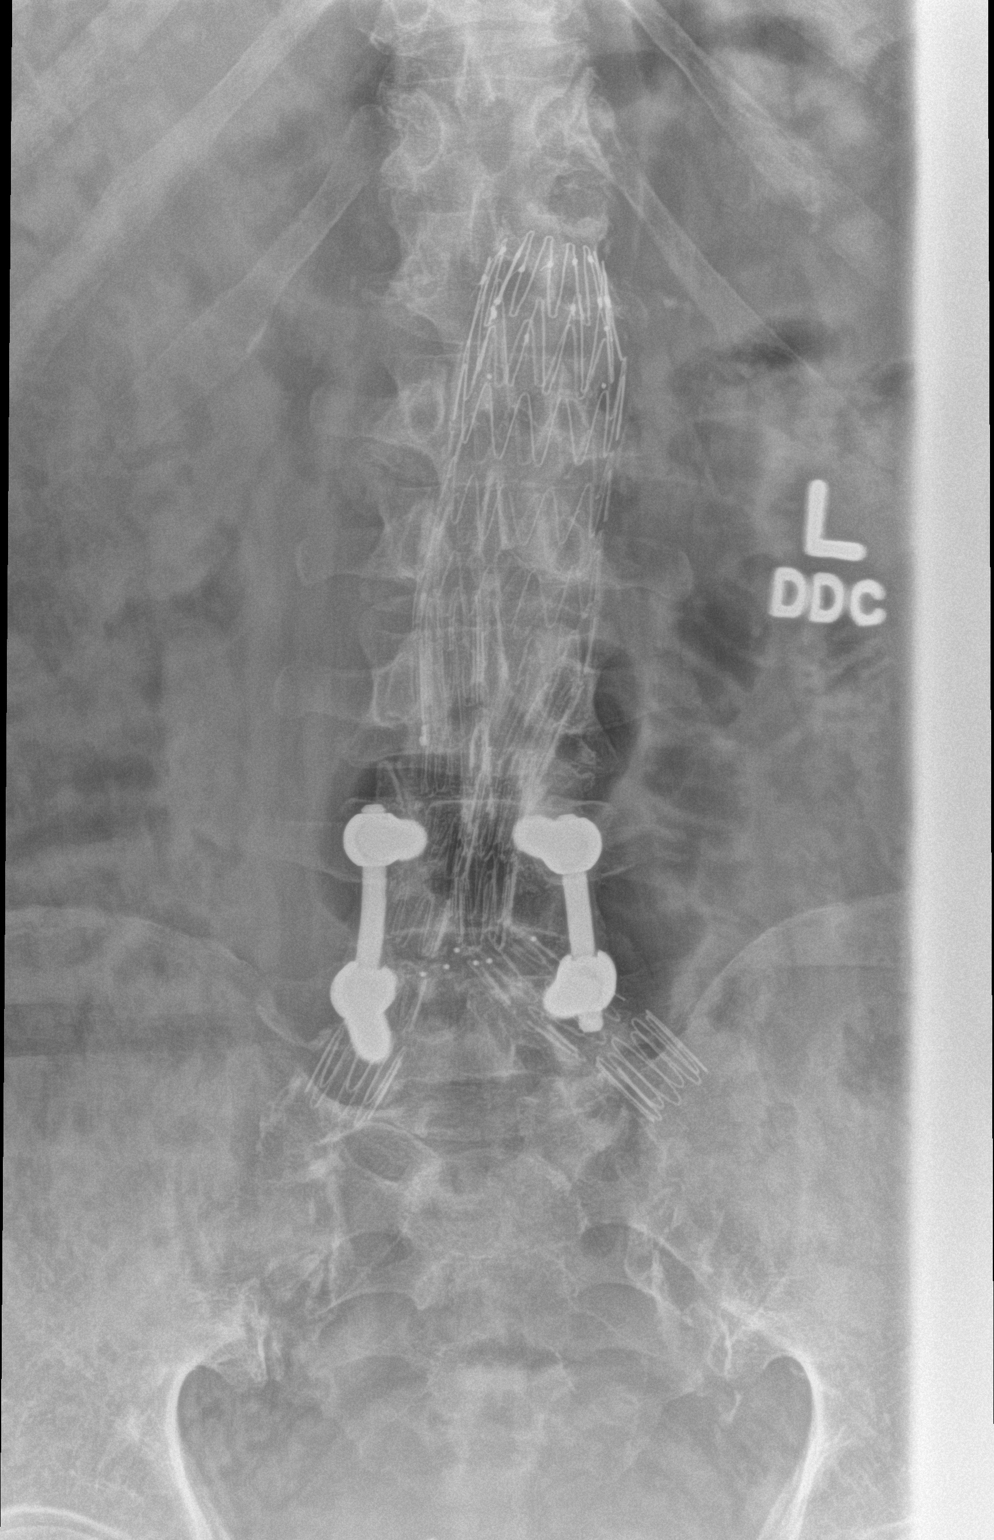

[l-spine lat (1 of 2)]
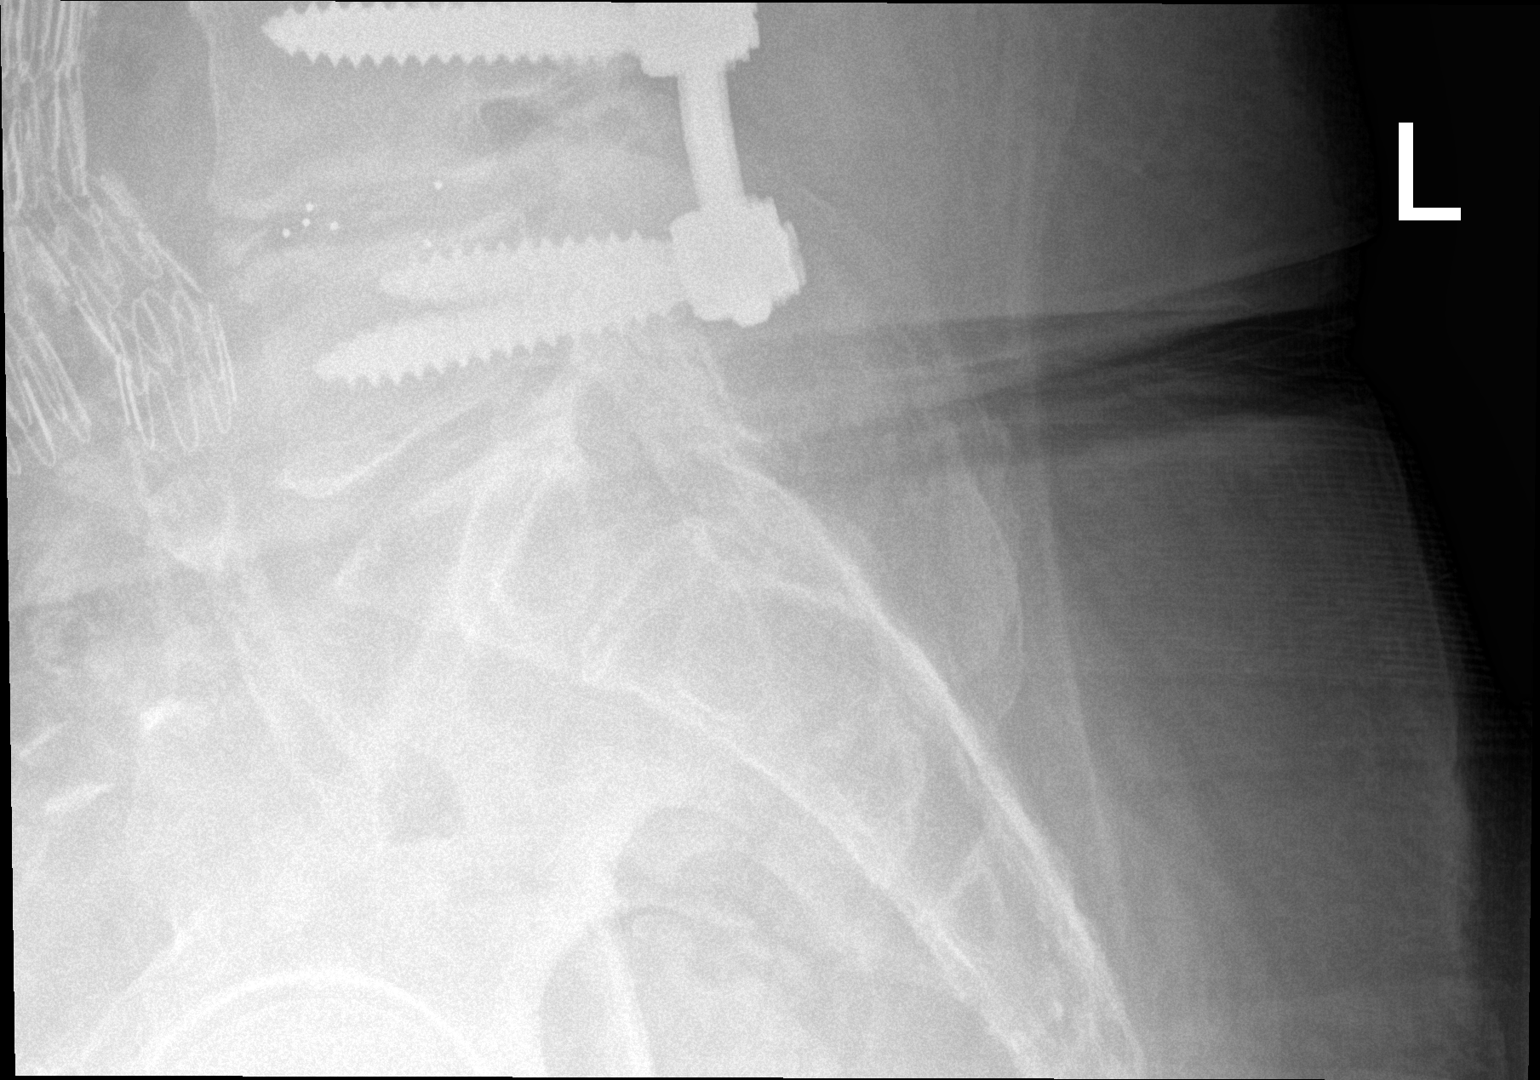

[l-spine lat (2 of 2)]
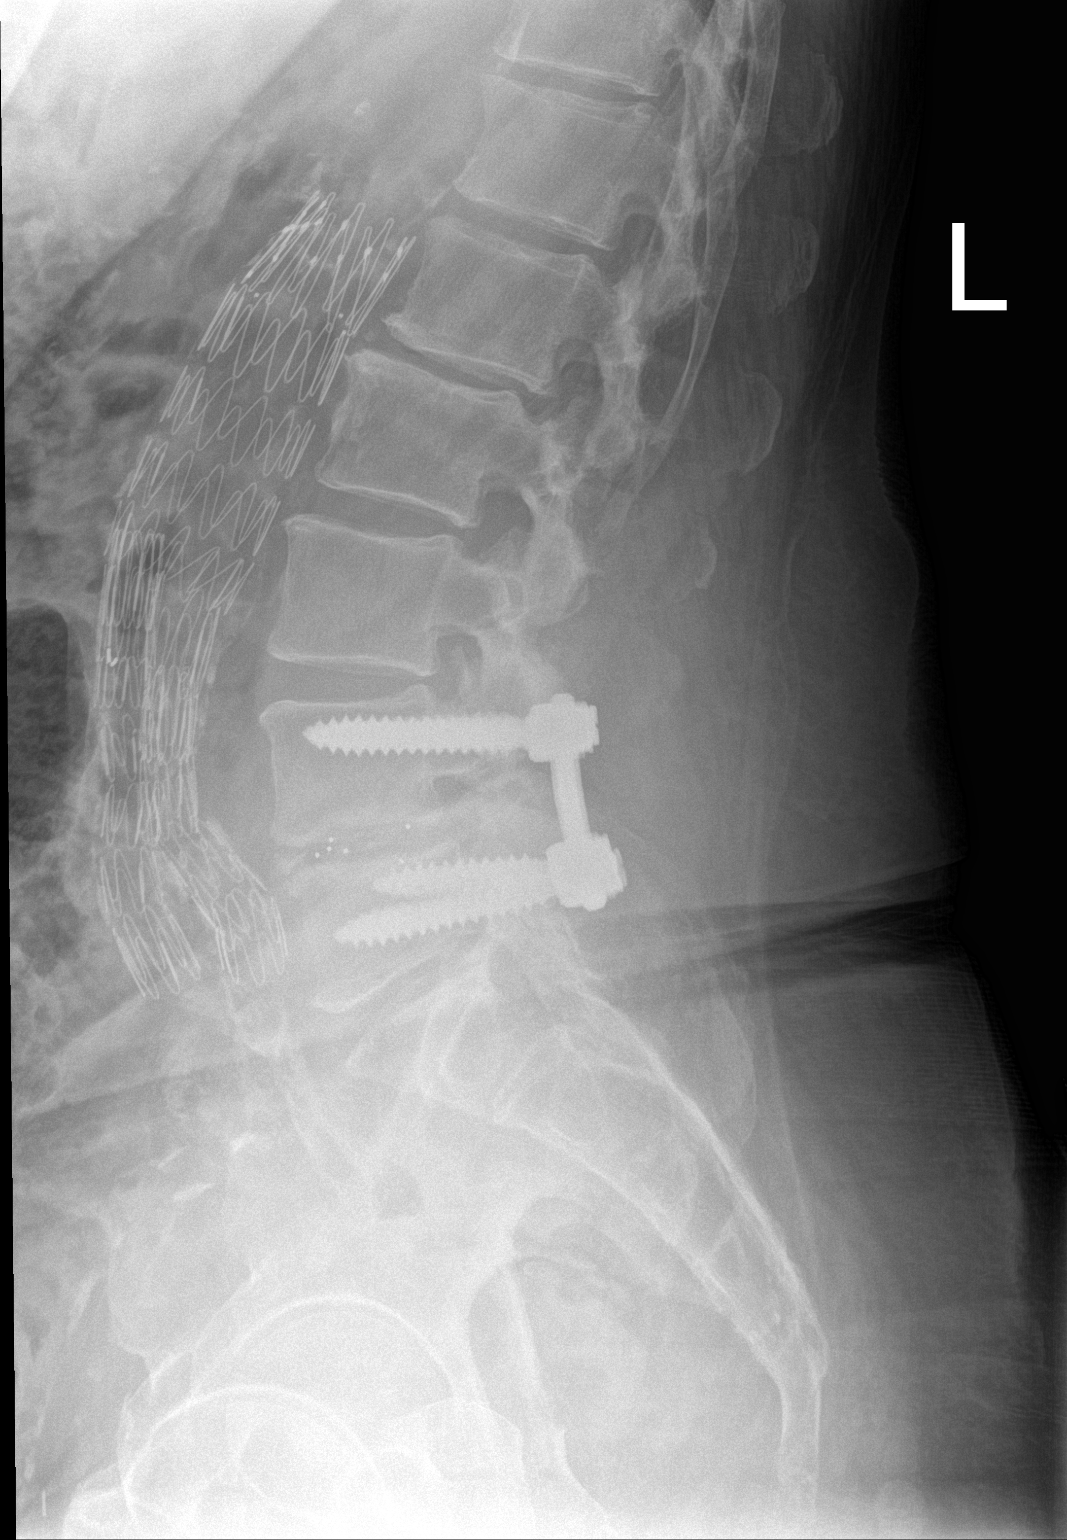

[3 of 3 positions shown; findings below may reference images not displayed]

FINDINGS: Metallic scaffolding from an aortic stent graft is again noted. The
alignment of the lumbar spine is normal. Pedicle screws and
posterior rods are identified at L4-5. Interbody spacer material is
noted within the L4-5 disc space. Mild L5-S1 degenerative disc
disease is noted. There is moderate L1-2 degenerative disc disease.
No acute fractures or subluxations.
IMPRESSION: 1. No acute findings.
2. Stable appearance of the lumbar spine status post L4-5 PLIF.

## 2017-10-24 ENCOUNTER — Encounter: Payer: Self-pay | Admitting: Pulmonary Disease

## 2017-10-24 ENCOUNTER — Ambulatory Visit (INDEPENDENT_AMBULATORY_CARE_PROVIDER_SITE_OTHER): Payer: Medicare Other | Admitting: Pulmonary Disease

## 2017-10-24 DIAGNOSIS — J441 Chronic obstructive pulmonary disease with (acute) exacerbation: Secondary | ICD-10-CM

## 2017-10-24 DIAGNOSIS — J9612 Chronic respiratory failure with hypercapnia: Secondary | ICD-10-CM | POA: Diagnosis not present

## 2017-10-24 DIAGNOSIS — J449 Chronic obstructive pulmonary disease, unspecified: Secondary | ICD-10-CM | POA: Diagnosis not present

## 2017-10-24 DIAGNOSIS — J986 Disorders of diaphragm: Secondary | ICD-10-CM

## 2017-10-24 NOTE — Patient Instructions (Signed)
Continue Symbicort and Spiriva Continue albuterol as needed for increased shortness of breath, cough, wheezing, chest tightness Continue oxygen therapy with sleep and as needed during the day trying to wear a minimum of 18 hours/day Follow-up in 3 months or sooner as needed

## 2017-10-25 NOTE — Progress Notes (Signed)
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  Extubated 07/21/16    DATA: CT chest 03/23/16: Right hemidiaphragm is elevated. No pleural fluid on the left. Minimal hazy infiltrate present in the apex of the left upper lobe. The right chest shows a small effusion largely layering dependently. There is extensive airspace filling in the upper lobe consistent with bronchopneumonia. There is patchy density in the right lower lobe in the right middle lobe that could be atelectasis or lesser bronchopneumonia. The appearance is improved since the CT of 03/03/16 Echocardiogram 07/17/16: grade I diastolic dysfunction. LVEF 60%. LA mildly dilated. RV mildly dilated CT head and neck 09/26/16: No acute intracranial abnormality identified. Small scalp contusion in the superior frontal region. No displaced calvarial fracture. 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 No acute fracture of the cervical spine. Stable postsurgical changes related to occipital to T2 fusion, C1 the C6 posterior decompression. No apparent hardware related complication. Stable advanced cervical spondylosis greatest at the C3 through C5 levels. Hospitalized 04/08-04/10/18 for hypercapnic respiratory failure Hospitalized 03/22-03/25/19 worsening LE edema and dyspnea. Given dx of PNA based on CT chest findings but his presentation was not consistent with PNA CT chest 09/09/17: Elevated right hemidiaphragm with RLL atelectasis Hospitalized 04/16-04/22/19: acute/chronic  hypercarbic failure due to AECOPD CT chest 10/09/17: Mild right upper lobe pneumonia. Elevated right hemidiaphragm with compressive atelectasis in the adjacent right lower lobe and right middle lobe.  INTERVAL: Hospitalized 04/16-04/22 as documented above  SUBJ: Post hospitalization visit. DC summary and Pulmonary consultation notes reviewed. He responded very quickly to usual therapy and BiPAP. He has completed steroids and abx. Spiriva was added to his chronic regimen. He is now back to his previous baseline of class III-IV dyspnea. Denies CP, fever, purulent sputum, hemoptysis, LE edema and calf tenderness    OBJ: Vitals:   10/24/17 1512 10/24/17 1522  BP:  138/64  Pulse:  70  SpO2:  97%  Weight: 230 lb (104.3 kg)   Height:  (1.727 m)   Room air  Gen: NAD HEENT: NCAT, sclera white Neck: S/P fusion with very limited mobility Lungs: breath sounds markedly diminished without minimal scattered wheezes Cardiovascular: RRR, no murmurs Abdomen: Soft, nontender, normal BS Ext: Symmetric pretibial, ankle, pedal edema with chronic venous stasis changes. No clubbing, cyanosis. Neuro: no new findings Skin: Limited exam, no lesions noted    DATA: BMP Latest Ref Rng & Units 10/10/2017 10/09/2017 10/08/2017  Glucose 65 - 99 mg/dL 83 91 95  BUN 6 - 20 mg/dL 16(X) 09(U) 04(V)  Creatinine 0.61 - 1.24 mg/dL 4.09 8.11 9.14  Sodium 135 - 145 mmol/L 127(L) 129(L) 131(L)  Potassium 3.5 - 5.1 mmol/L 4.5 4.6 4.5  Chloride 101 - 111 mmol/L 89(L) 91(L) 91(L)  CO2 22 - 32 mmol/L 33(H) 35(H) 37(H)  Calcium 8.9 - 10.3 mg/dL 7.8(G) 9.5(A) 8.3(L)   CBC Latest Ref Rng & Units 10/05/2017 10/04/2017 10/04/2017  WBC 3.8 - 10.6 K/uL 4.4 6.4 5.9  Hemoglobin 13.0 - 18.0 g/dL 10.6(L) 11.1(L) 11.1(L)  Hematocrit 40.0 - 52.0 % 31.2(L) 33.1(L) 32.3(L)  Platelets 150 - 440 K/uL 123(L) 125(L)  133(L)   CXR 10/04/17: NSC chronically elevated right hemidiaphragm.  No acute pulmonary or cardiac  findings  IMPRESSION: COPD, severe   Chronic respiratory failure with hypercapnia   Right diaphragmatic paralysis  Recent COPD exacerbation   PLAN/REC: Continue Symbicort and Spiriva Continue albuterol as needed for increased shortness of breath, cough, wheezing, chest tightness Continue oxygen therapy with sleep and as needed during the day trying to wear a minimum of 18 hours/day Follow-up in 3 months or sooner as needed  Billy Fischer, MD PCCM service Mobile [phone] Pager [phone] 10/25/2017 4:38 PM

## 2017-10-28 IMAGING — CT CT HEAD W/O CM
5 of 14 series · 17 of 47 positions shown, 18 images · non-contrast
Comparison: 07/22/2015

CLINICAL DATA: Multiple falls, hypoxia, head injury

EXAM:
CT HEAD WITHOUT CONTRAST
CT CERVICAL SPINE WITHOUT CONTRAST
TECHNIQUE: Multidetector CT imaging of the head and cervical spine was
performed following the standard protocol without intravenous
contrast. Multiplanar CT image reconstructions of the cervical spine
were also generated.

[Series 2: head wo · axial · 0.46mm/px · z∈[-93,+57]mm · 3 of 31 slices shown, 4 images]
[im 1/31  brain]
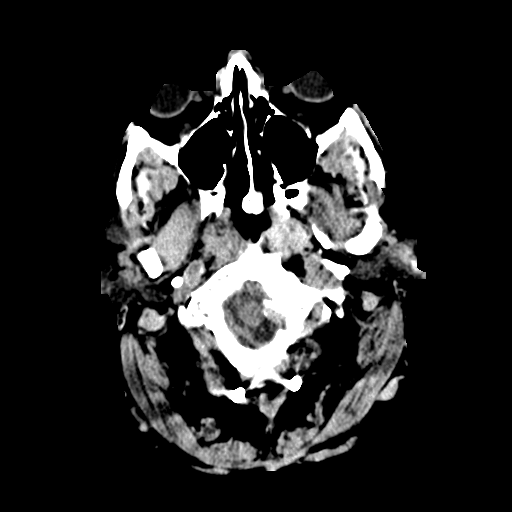
[im 1/31  bone]
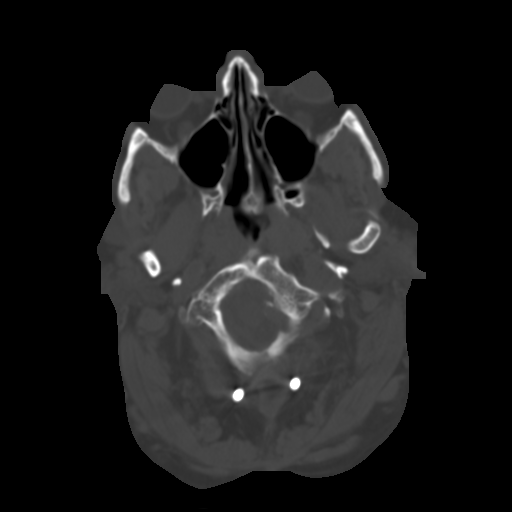
[im 16/31  brain]
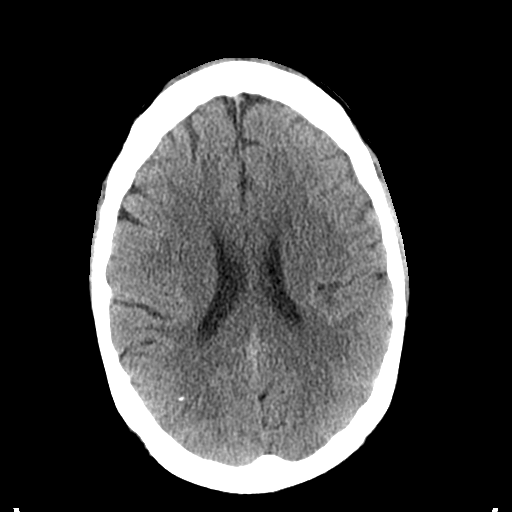
[im 31/31  brain]
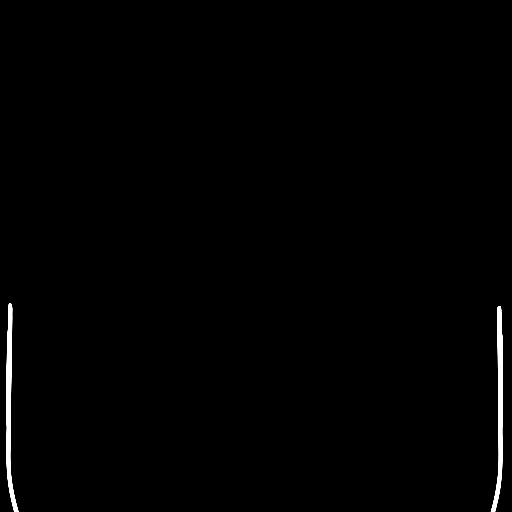

[Series 4: coronal soft tissue · coronal · 0.29mm/px · 1 of 69 slices shown]
[im 35/69  brain]
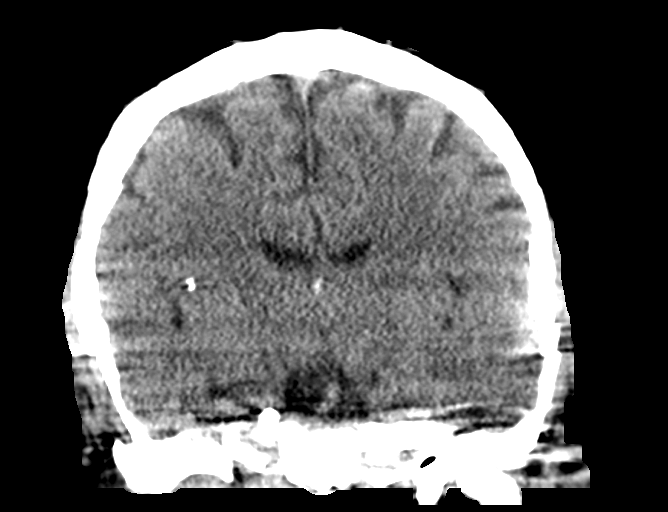

[Series 11: c spine soft · axial · 0.33mm/px · z∈[-233,-141]mm · 4 of 93 slices shown]
[im 16/93  brain]
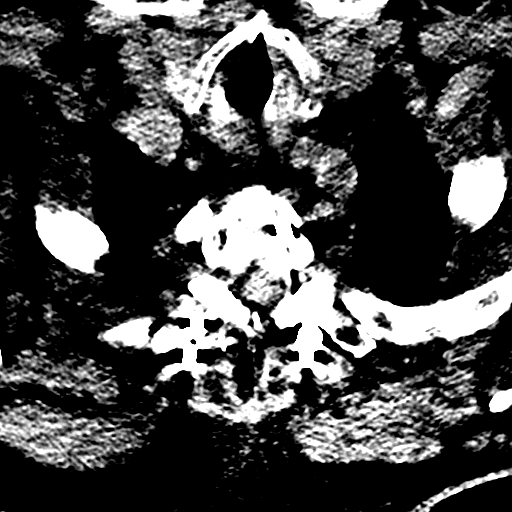
[im 31/93  brain]
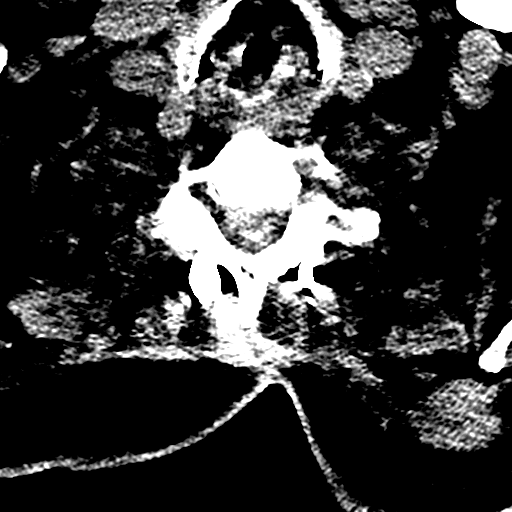
[im 47/93  brain]
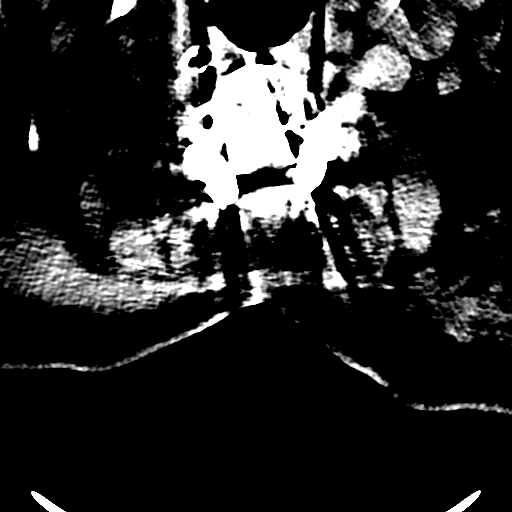
[im 62/93  brain]
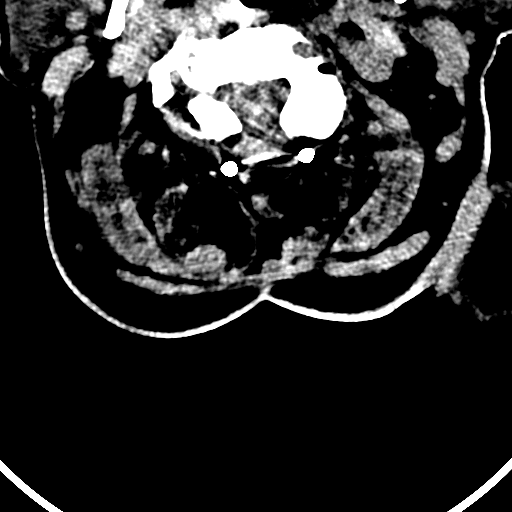

[Series 14: orthogonal bone · axial · 0.23mm/px · z∈[-262,-133]mm · 6 of 103 slices shown (1 of 2)]
[im 15/103  bone]
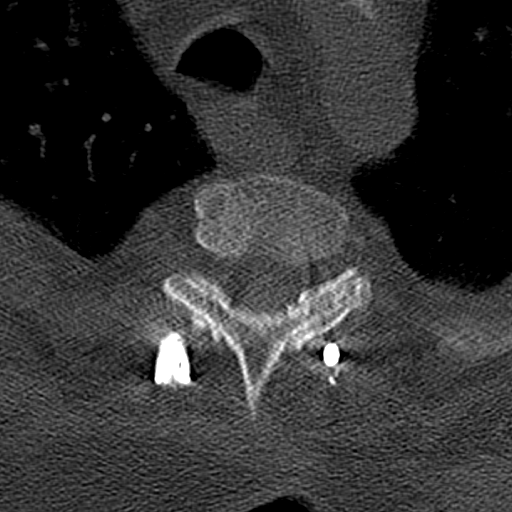
[im 30/103  bone]
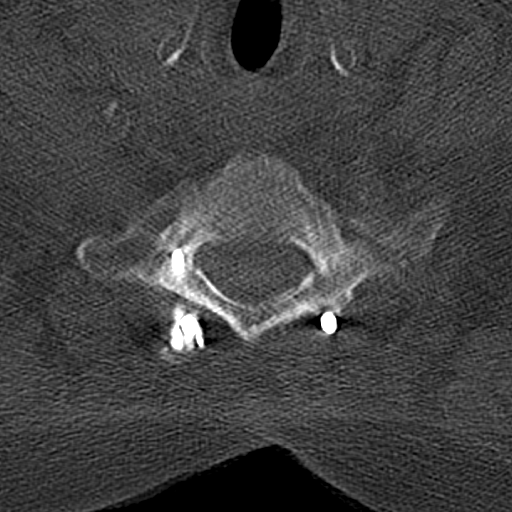
[im 44/103  bone]
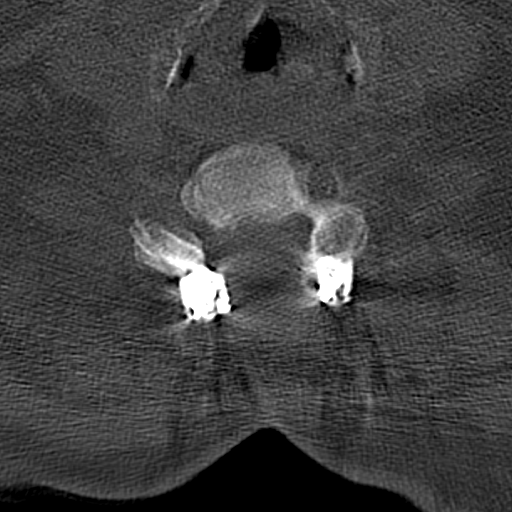
[im 59/103  bone]
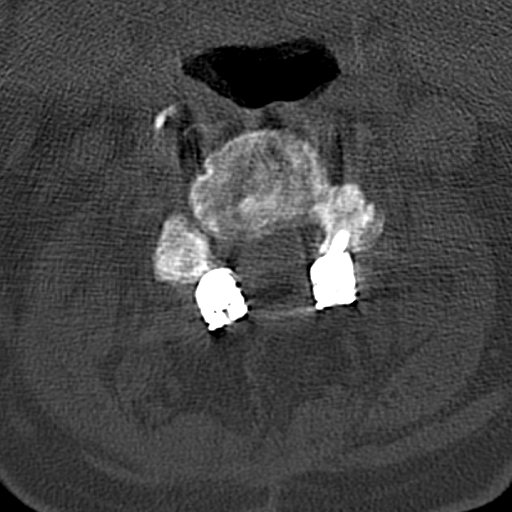
[im 73/103  bone]
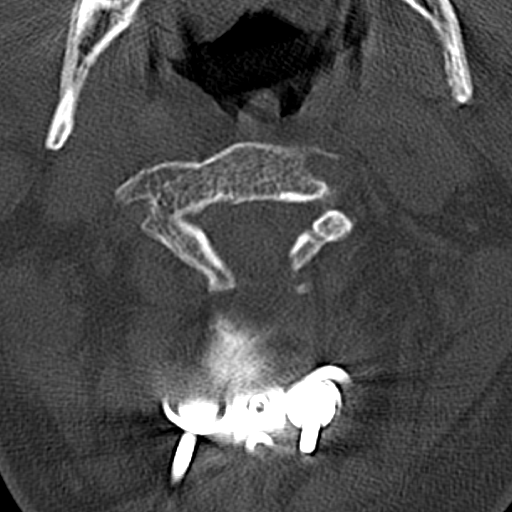
[im 88/103  bone]
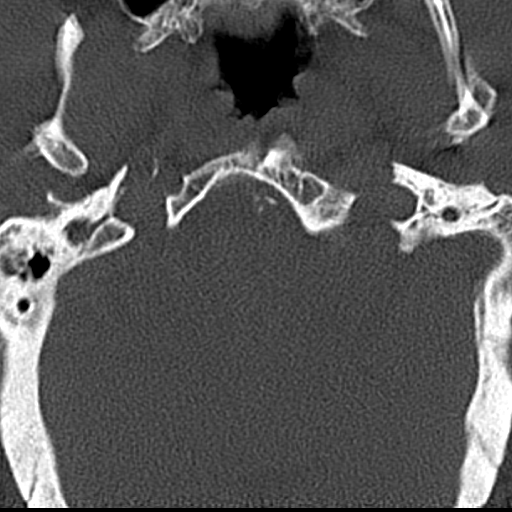

[Series 21: orthogonal bone · axial · 0.23mm/px · z∈[-255,-200]mm · 3 of 64 slices shown (2 of 2)]
[im 16/64  bone]
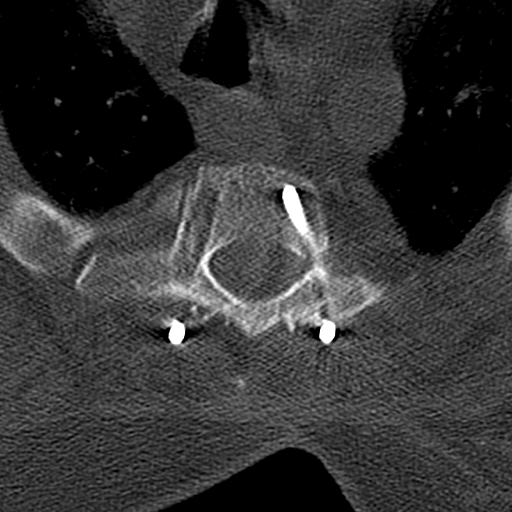
[im 32/64  bone]
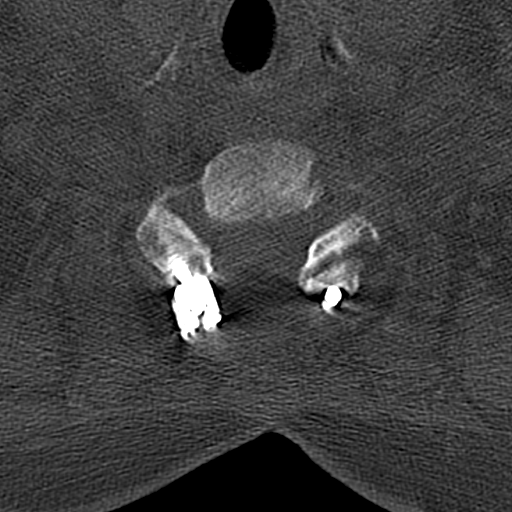
[im 48/64  bone]
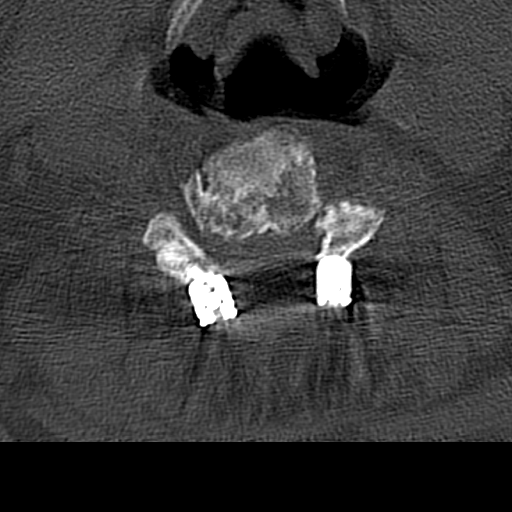

[17 of 47 positions shown; findings below may reference images not displayed]

FINDINGS: CT HEAD FINDINGS

Motion degraded images.

Brain: No evidence of acute infarction, hemorrhage, hydrocephalus,
extra-axial collection or mass lesion/mass effect.

Stable subcortical hypodensity in the right frontal lobe, chronic.
Subcortical white matter and periventricular small vessel ischemic
changes. Intracranial atherosclerosis.

Stable scattered cortical calcifications in the right hemisphere,
chronic.

Vascular: No hyperdense vessel or unexpected calcification.

Skull: No evidence of calvarial fracture. Stable postsurgical
changes along the posterior occipital region.

Sinuses/Orbits: The visualized paranasal sinuses are essentially
clear. The mastoid air cells are unopacified.

Other: Cerebral volume is within normal limits. No ventriculomegaly.

CT CERVICAL SPINE FINDINGS

Motion degraded images.

Alignment: Straightening of the cervical spine.

Stable posterior cervical fixation hardware extending from the
occiput foot through T2. Stable lucency involving the T2 pedicle
screws. No evidence of hardware fracture.

Skull base and vertebrae: No acute fracture. No primary bone lesion
or focal pathologic process.

Soft tissues and spinal canal: No prevertebral fluid or swelling. No
visible canal hematoma.

Disc levels:  Moderate degenerative changes at C3-4 and C4-5.

Spinal canal remains patent.

Upper chest: Visualized lung apices are clear.

Other: Visualized thyroid is unremarkable.
IMPRESSION: No evidence of acute intracranial abnormality. Small vessel ischemic
changes.

No evidence of traumatic injury to the cervical spine. Stable
postsurgical changes, as above. Moderate degenerative changes of the
mid cervical spine.

## 2017-10-28 IMAGING — DX DG CHEST 1V PORT
1 series · 1 of 1 positions shown · non-contrast
Comparison: Portable exam 7674 hours compared to 11/02/2015

CLINICAL DATA: Shortness of breath, multiple falls in past couple
weeks, history hypertension, COPD

EXAM:
PORTABLE CHEST 1 VIEW

[chest ap]
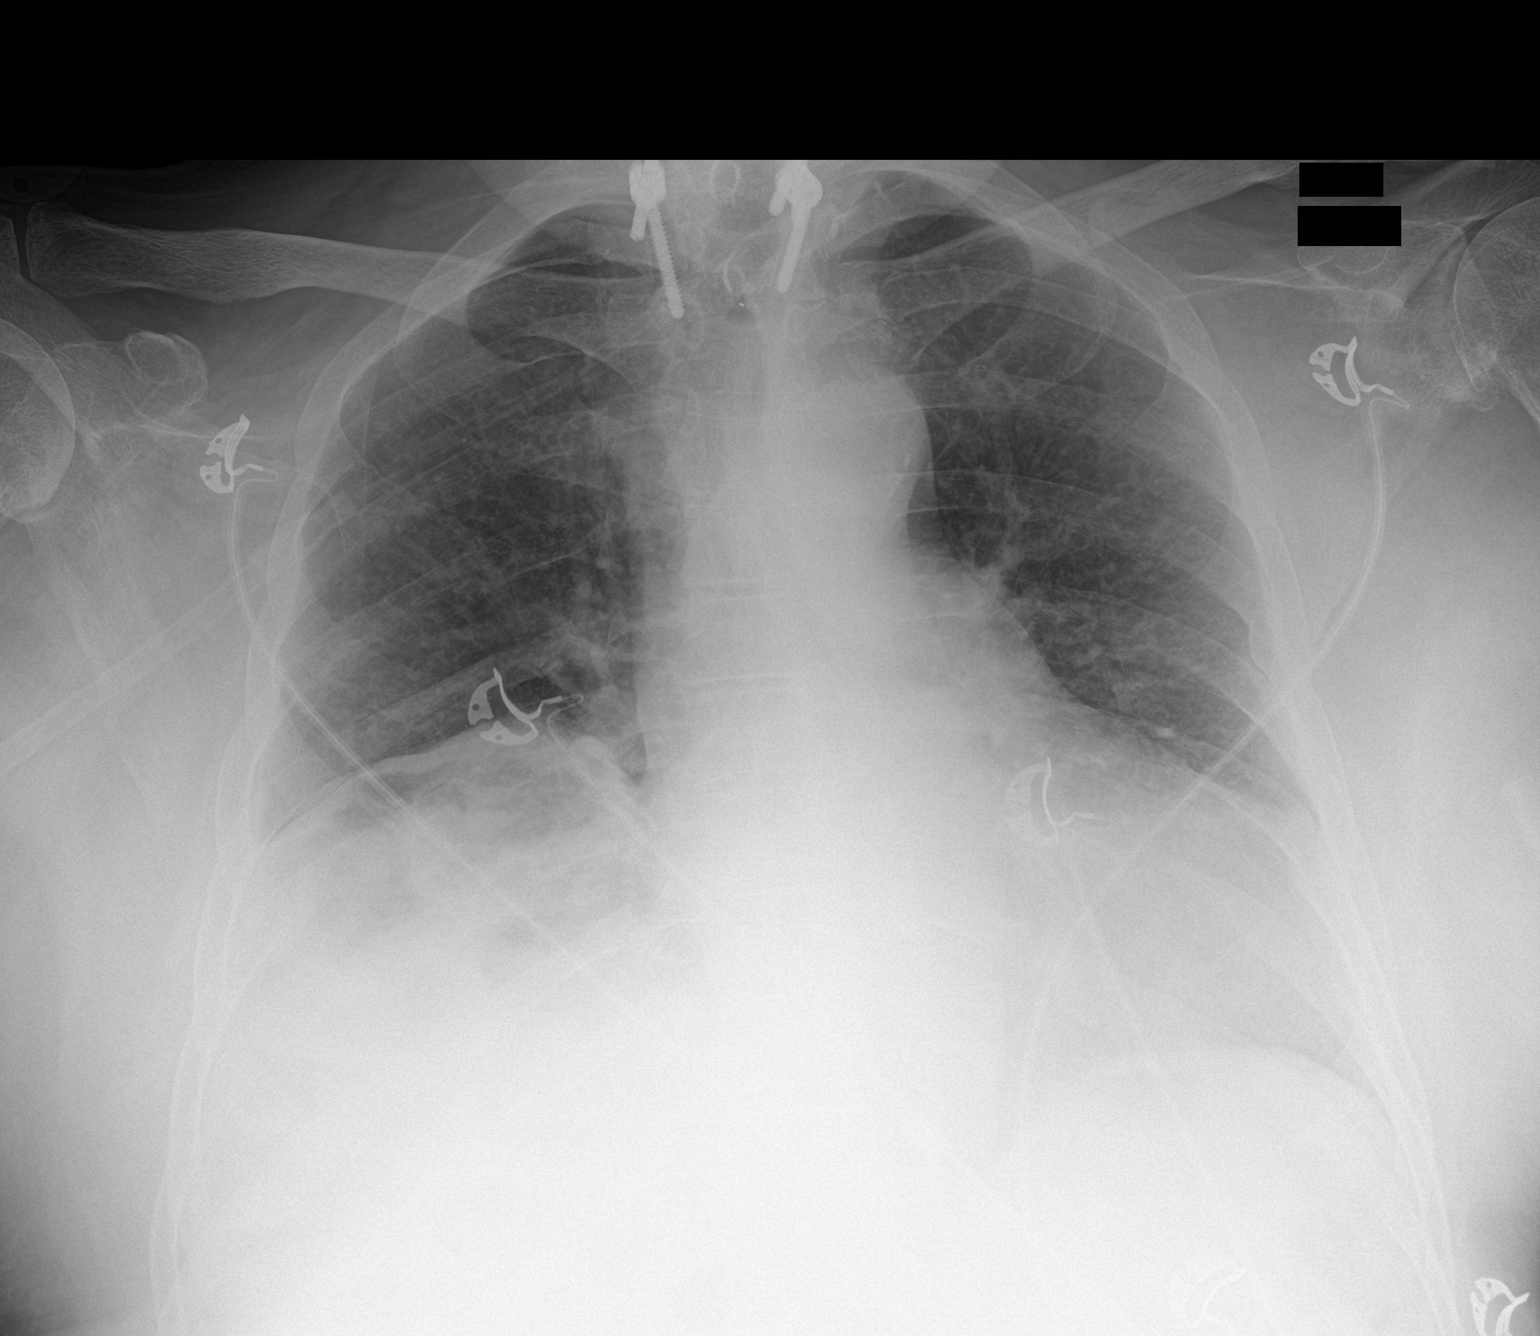

[1 of 1 positions shown; findings below may reference images not displayed]

FINDINGS: Enlargement of cardiac silhouette.

Atherosclerotic calcification aorta.

Mild pulmonary vascular congestion.

Chronic elevation of RIGHT diaphragm with bowel interposition.

Lungs grossly clear.

No pleural effusion or pneumothorax.

Bones demineralized.

Prior cervical thoracic fusion.

LEFT glenohumeral degenerative changes.
IMPRESSION: Enlargement of cardiac silhouette with pulmonary vascular
congestion.

No acute infiltrate.

Aortic atherosclerosis.

## 2017-10-29 IMAGING — CR DG HIP (WITH OR WITHOUT PELVIS) 2-3V*L*
3 series · 4 of 4 positions shown · non-contrast
Comparison: None.

CLINICAL DATA: Left hip pain after multiple falls at home
yesterday.

EXAM:
DG HIP (WITH OR WITHOUT PELVIS) 2-3V LEFT

[Series 1: pelvis ap · 0.14mm/px · 2 of 2 slices shown]
[im 1/2]
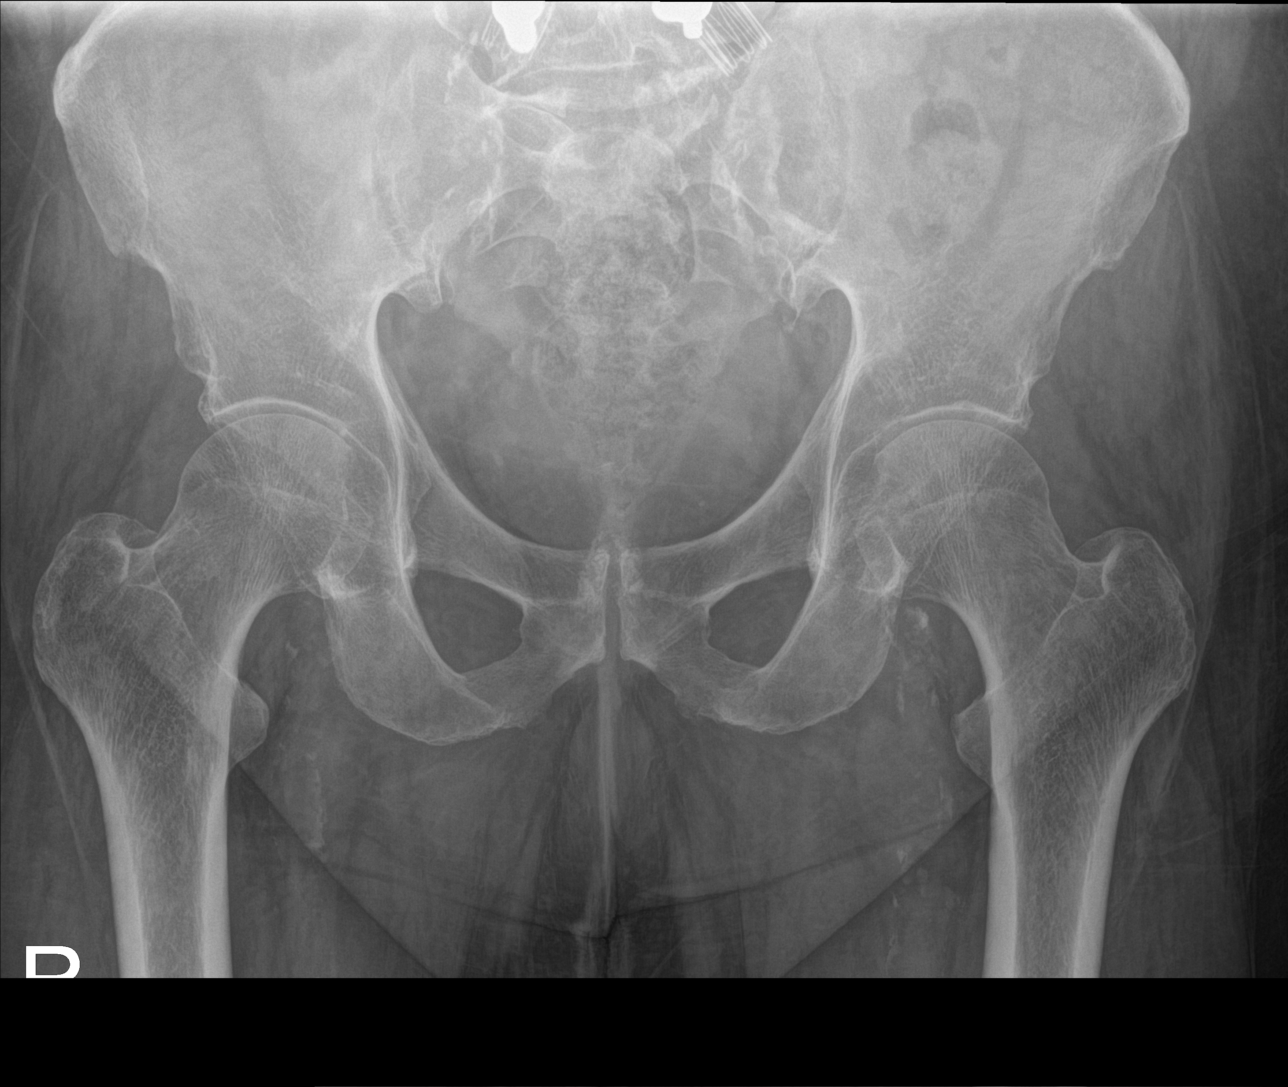
[im 2/2]
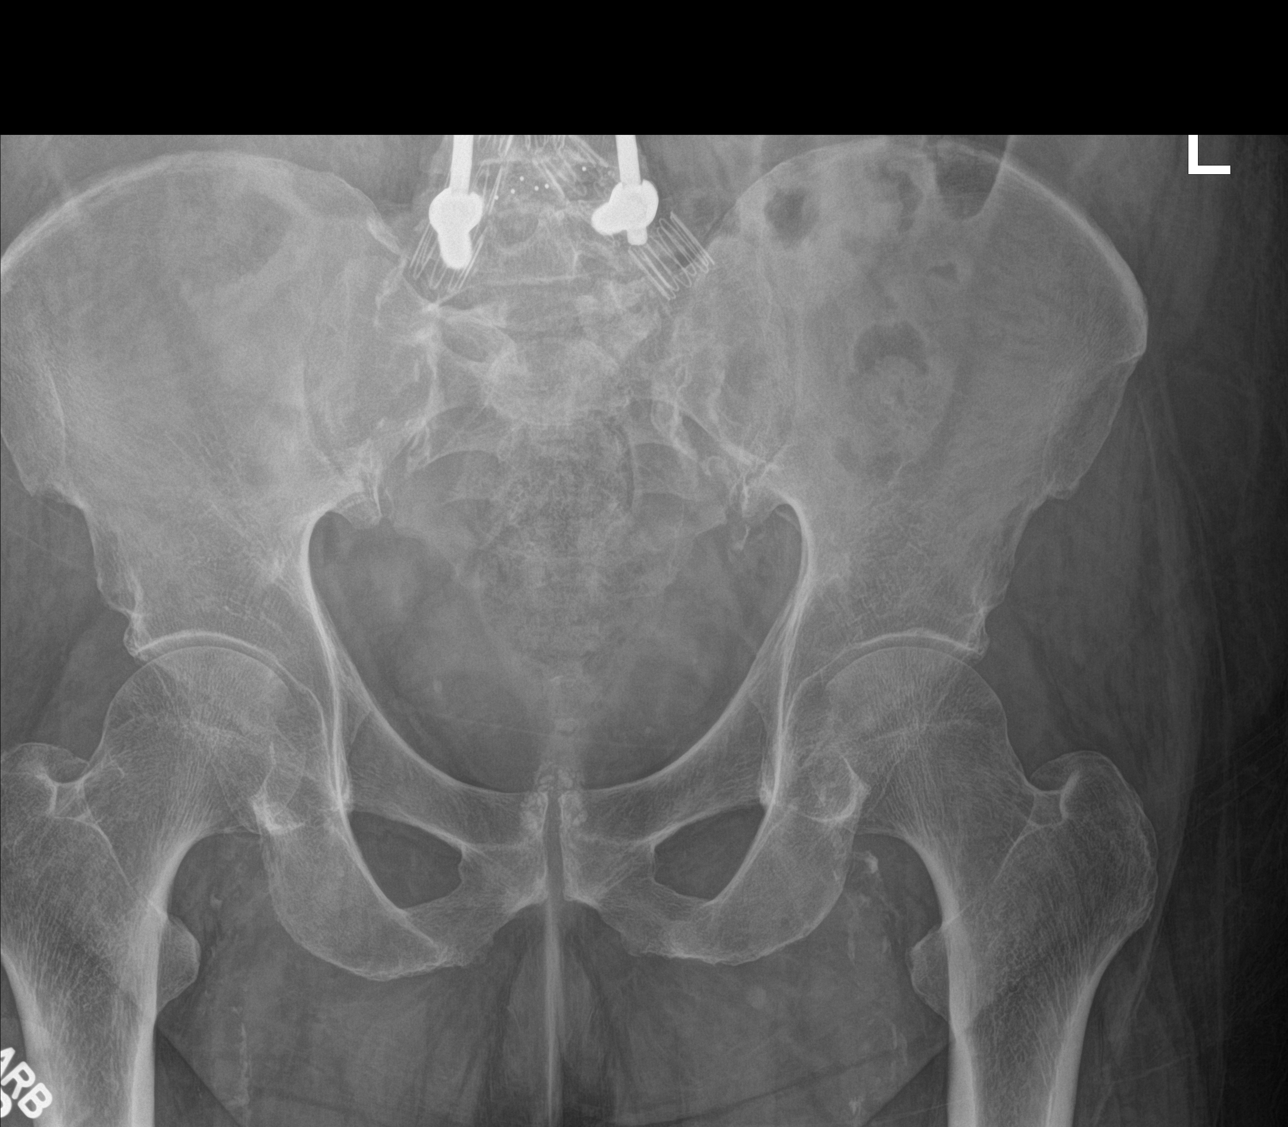

[hip ap]
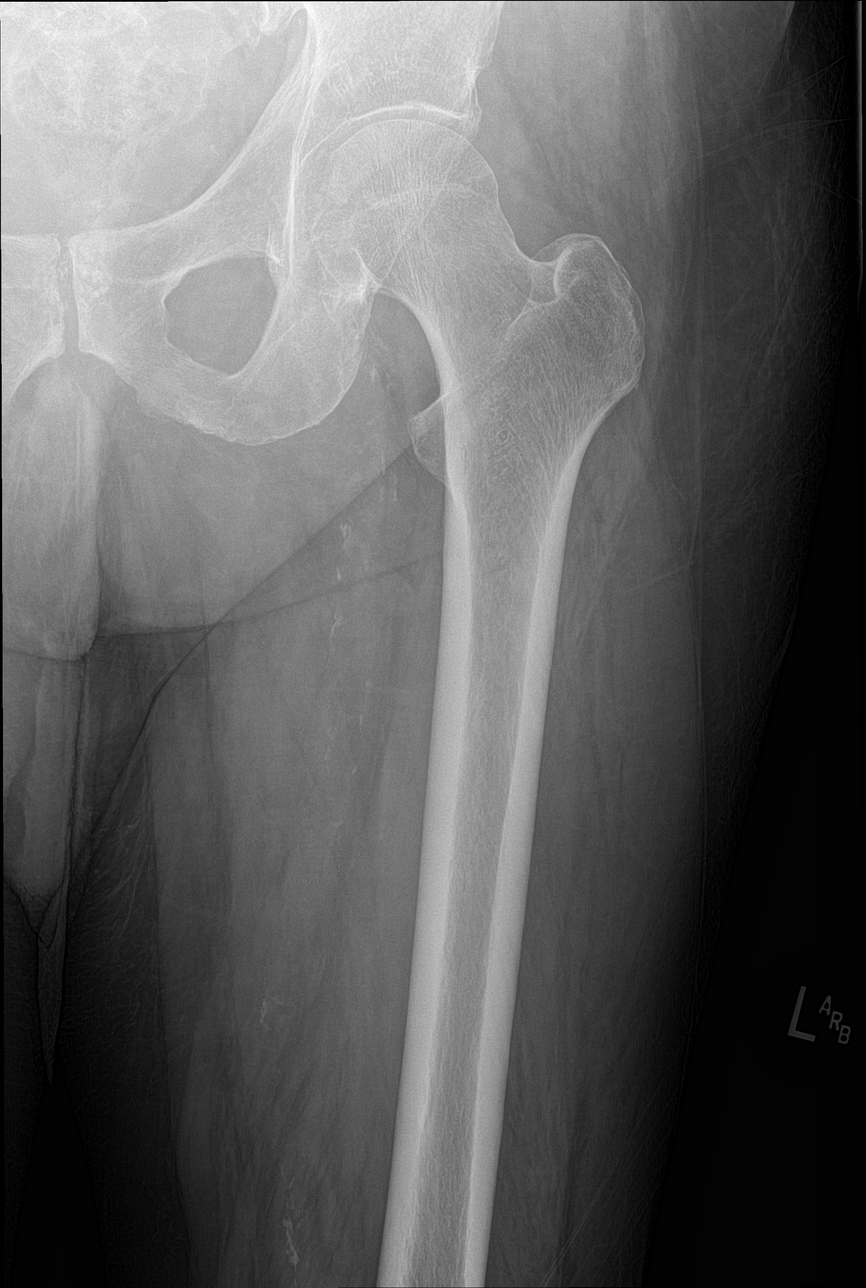

[hip lat]
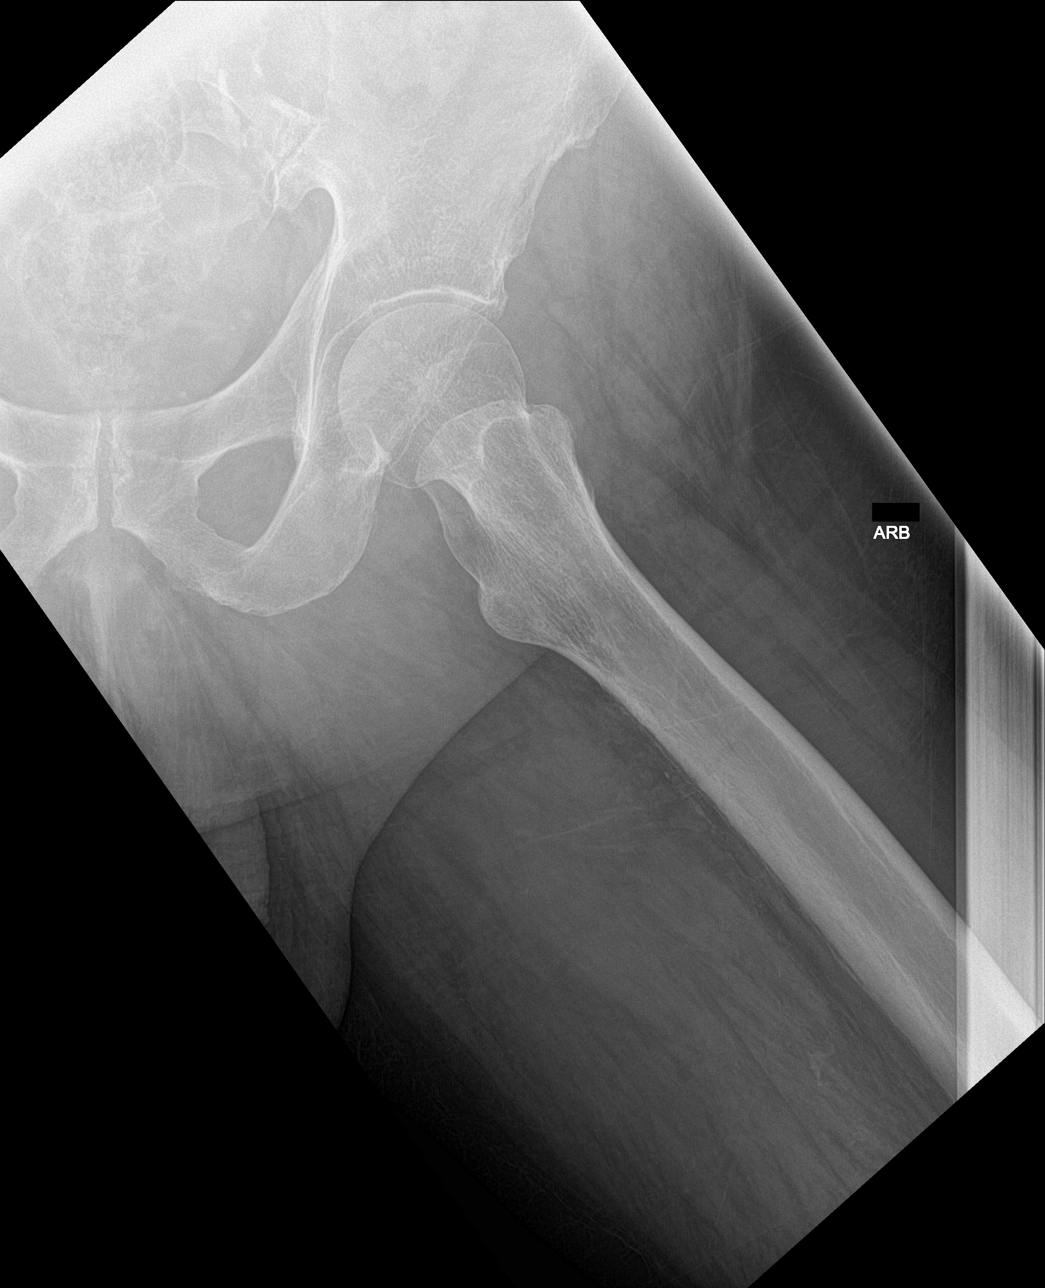

[4 of 4 positions shown; findings below may reference images not displayed]

FINDINGS: There is no evidence of hip fracture or dislocation. There is no
evidence of arthropathy or other focal bone abnormality.
IMPRESSION: Normal left hip.

## 2017-11-02 IMAGING — CR DG FEMUR 2+V*L*
4 series · 4 of 4 positions shown · non-contrast
Comparison: None.

CLINICAL DATA: Found unresponsive on floor at home, probable fall

EXAM:
LEFT FEMUR 2 VIEWS

[femur ap (1 of 2)]
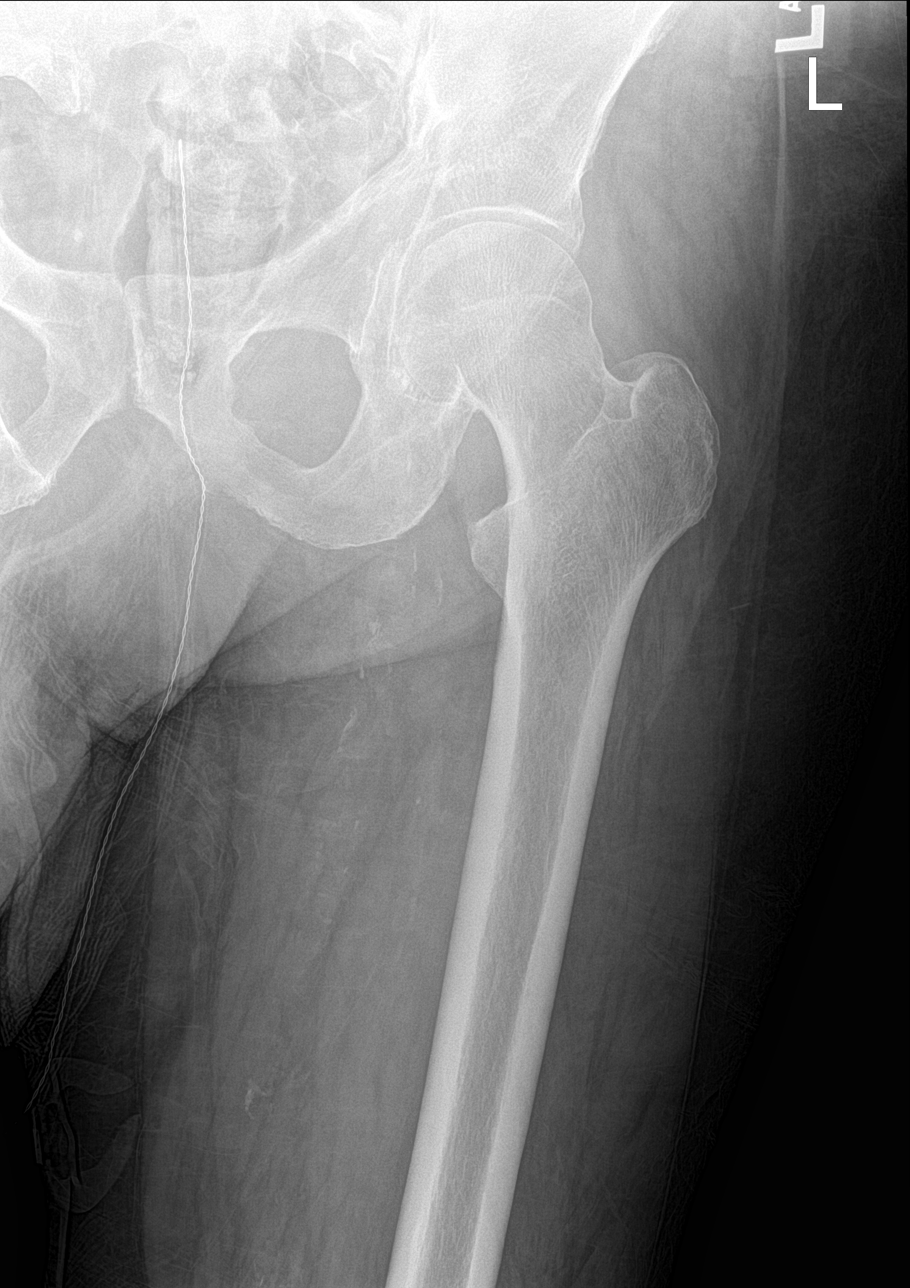

[femur ap (2 of 2)]
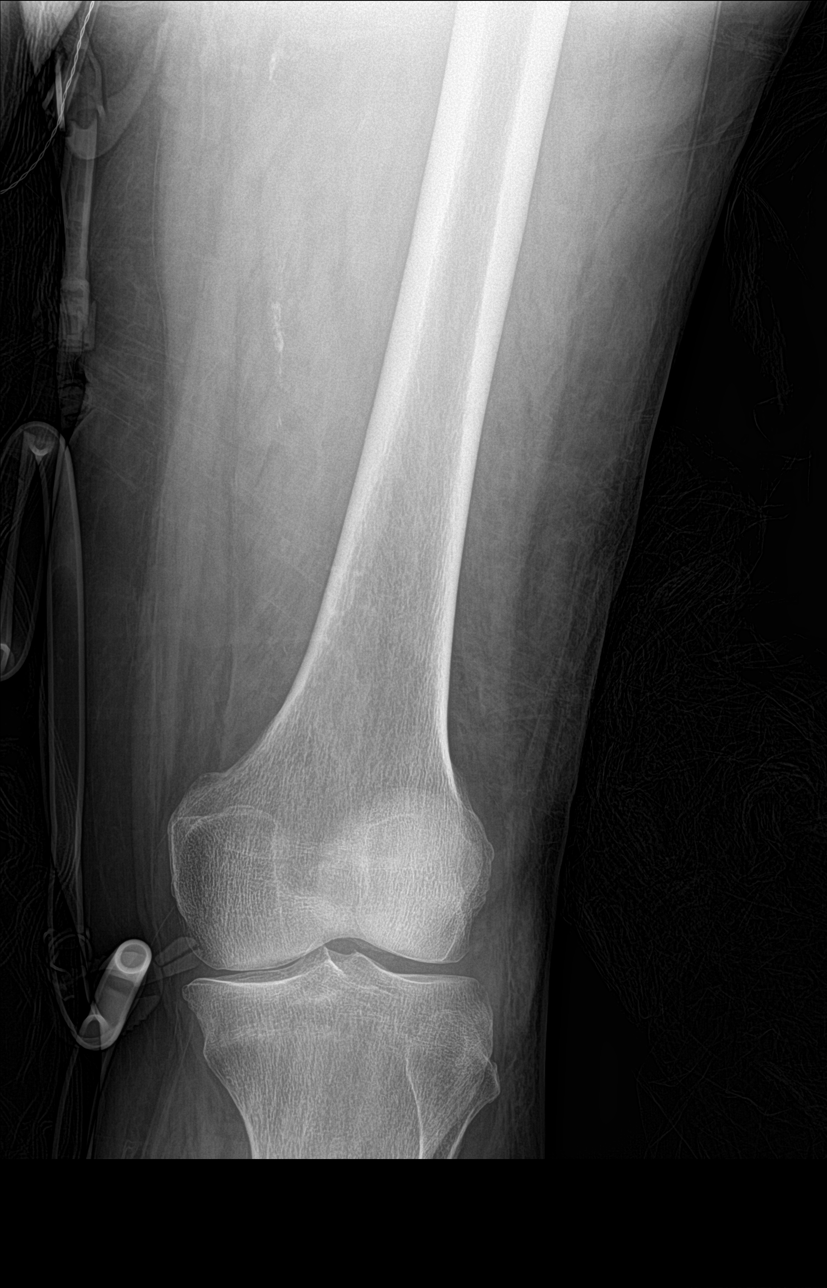

[femur lat (1 of 2)]
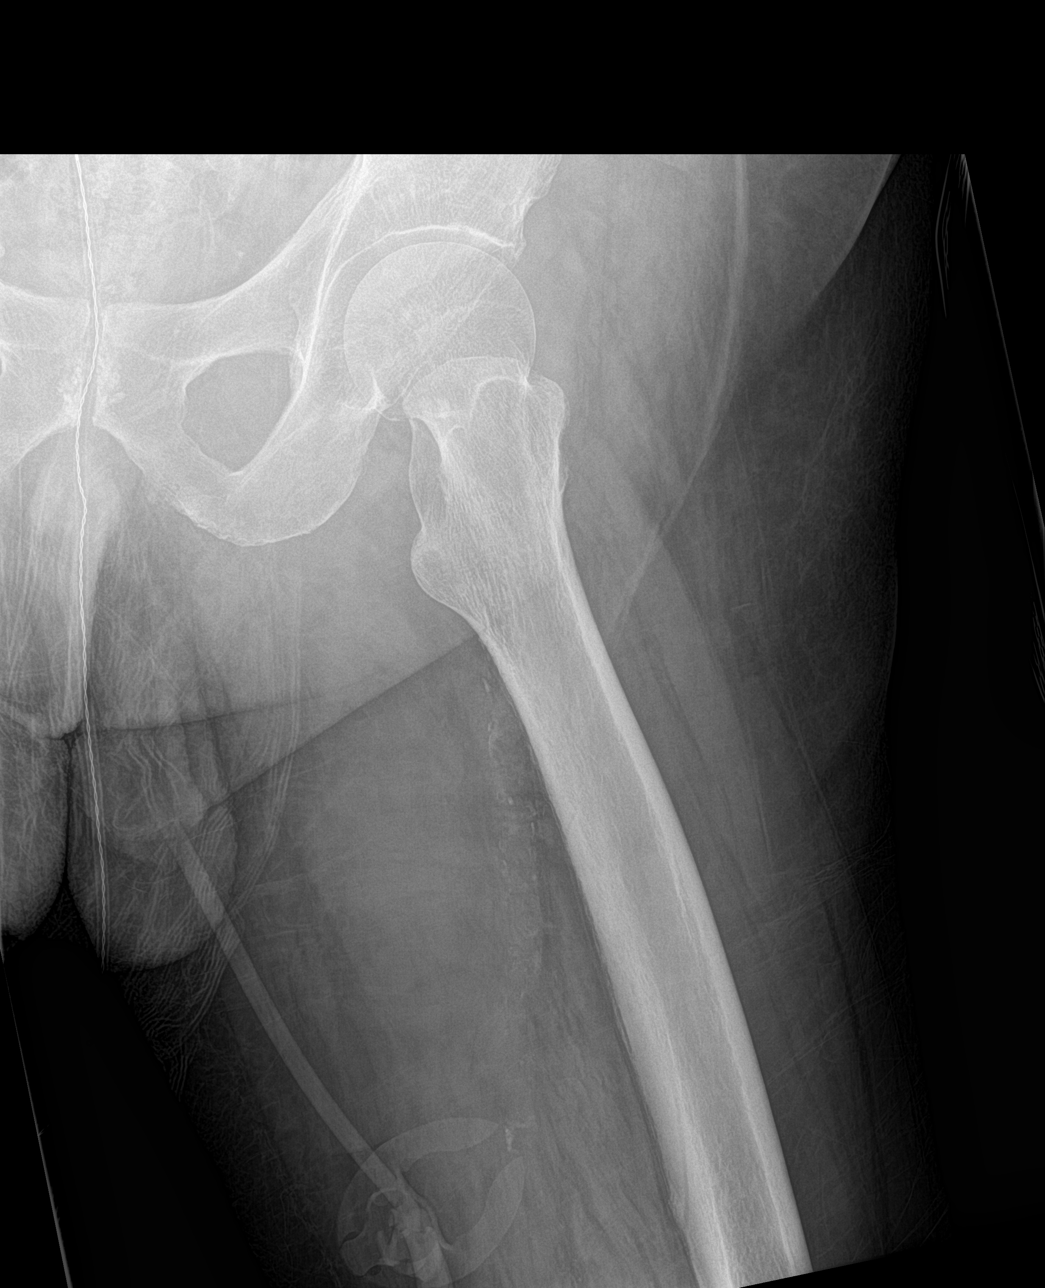

[femur lat (2 of 2)]
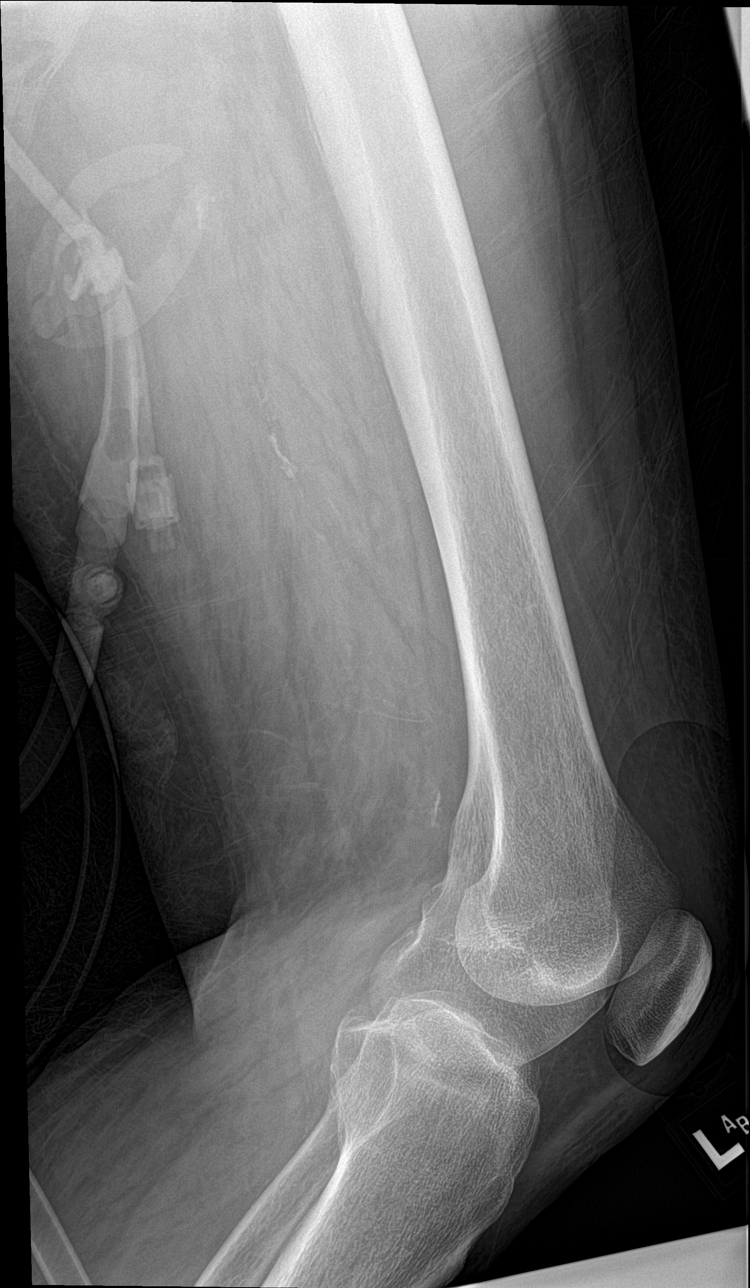

[4 of 4 positions shown; findings below may reference images not displayed]

FINDINGS: Views of the left humerus show no acute fracture. Alignment is
normal. There is calcification of the superficial femoral artery
present.
IMPRESSION: No acute fracture.

## 2017-11-02 IMAGING — CR DG FEMUR 2+V*R*
4 series · 4 of 4 positions shown · non-contrast
Comparison: None.

CLINICAL DATA: Found unresponsive at home, probable fall

EXAM:
RIGHT FEMUR 2 VIEWS

[femur ap (1 of 2)]
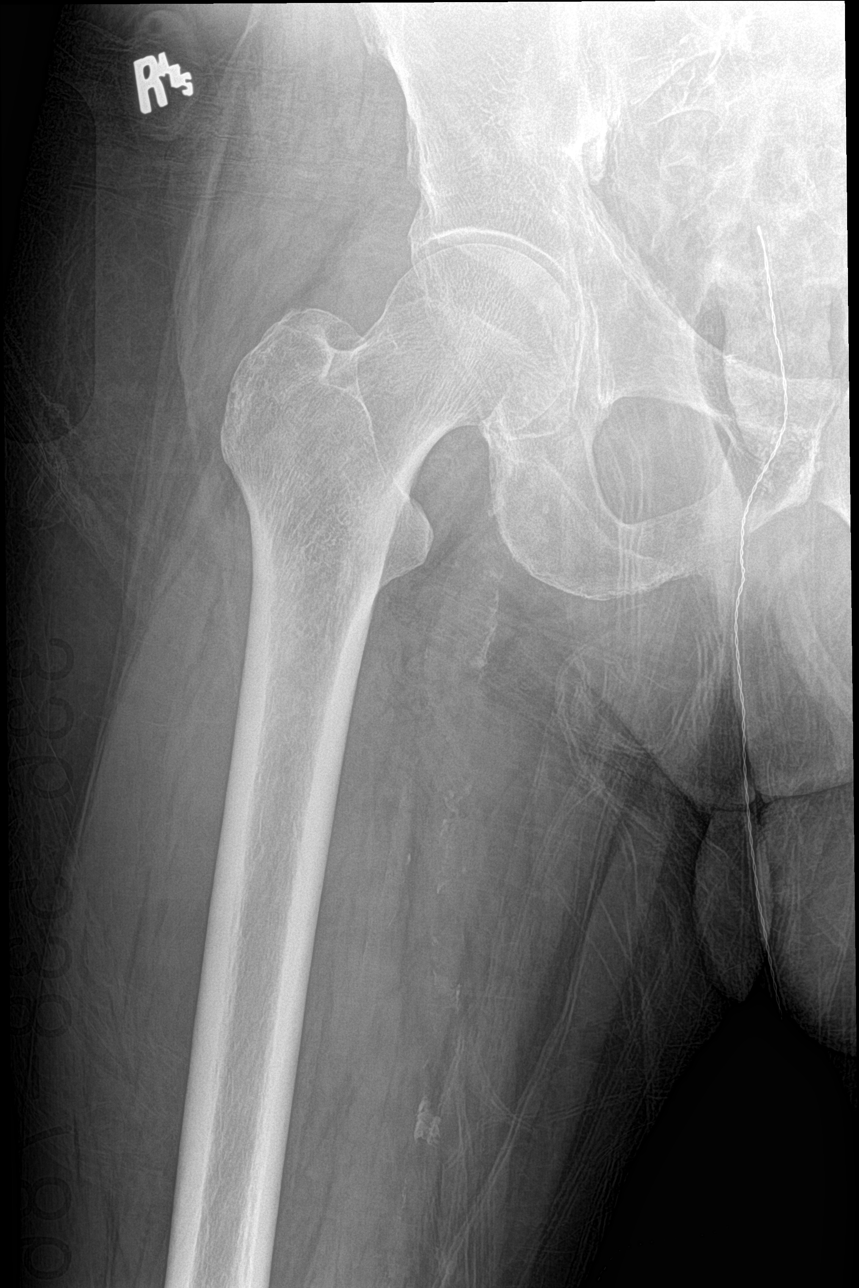

[femur ap (2 of 2)]
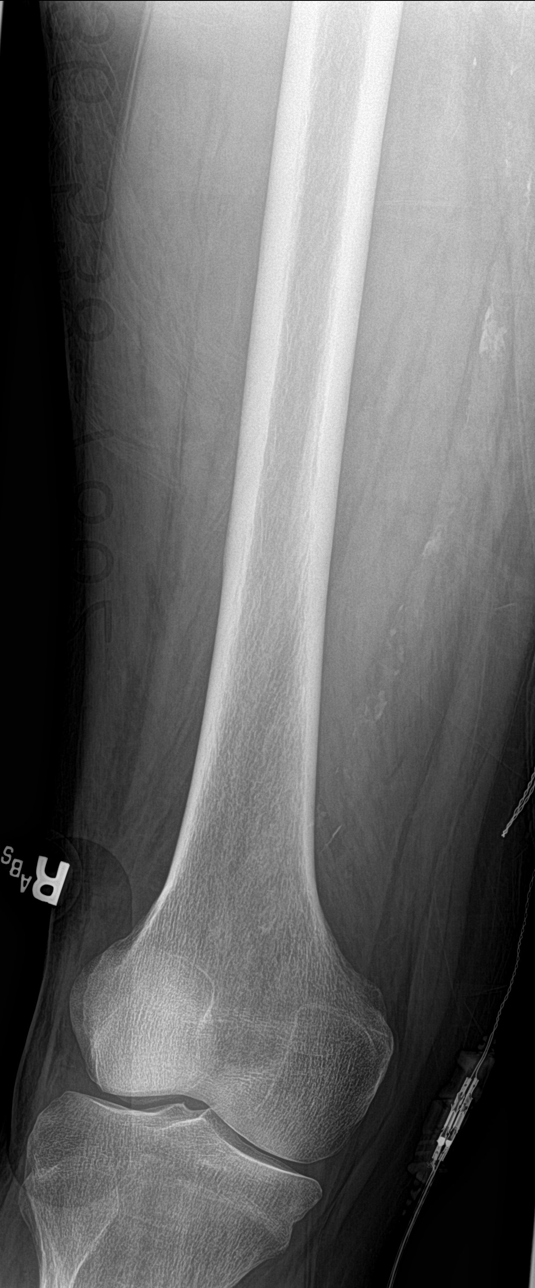

[femur lat (1 of 2)]
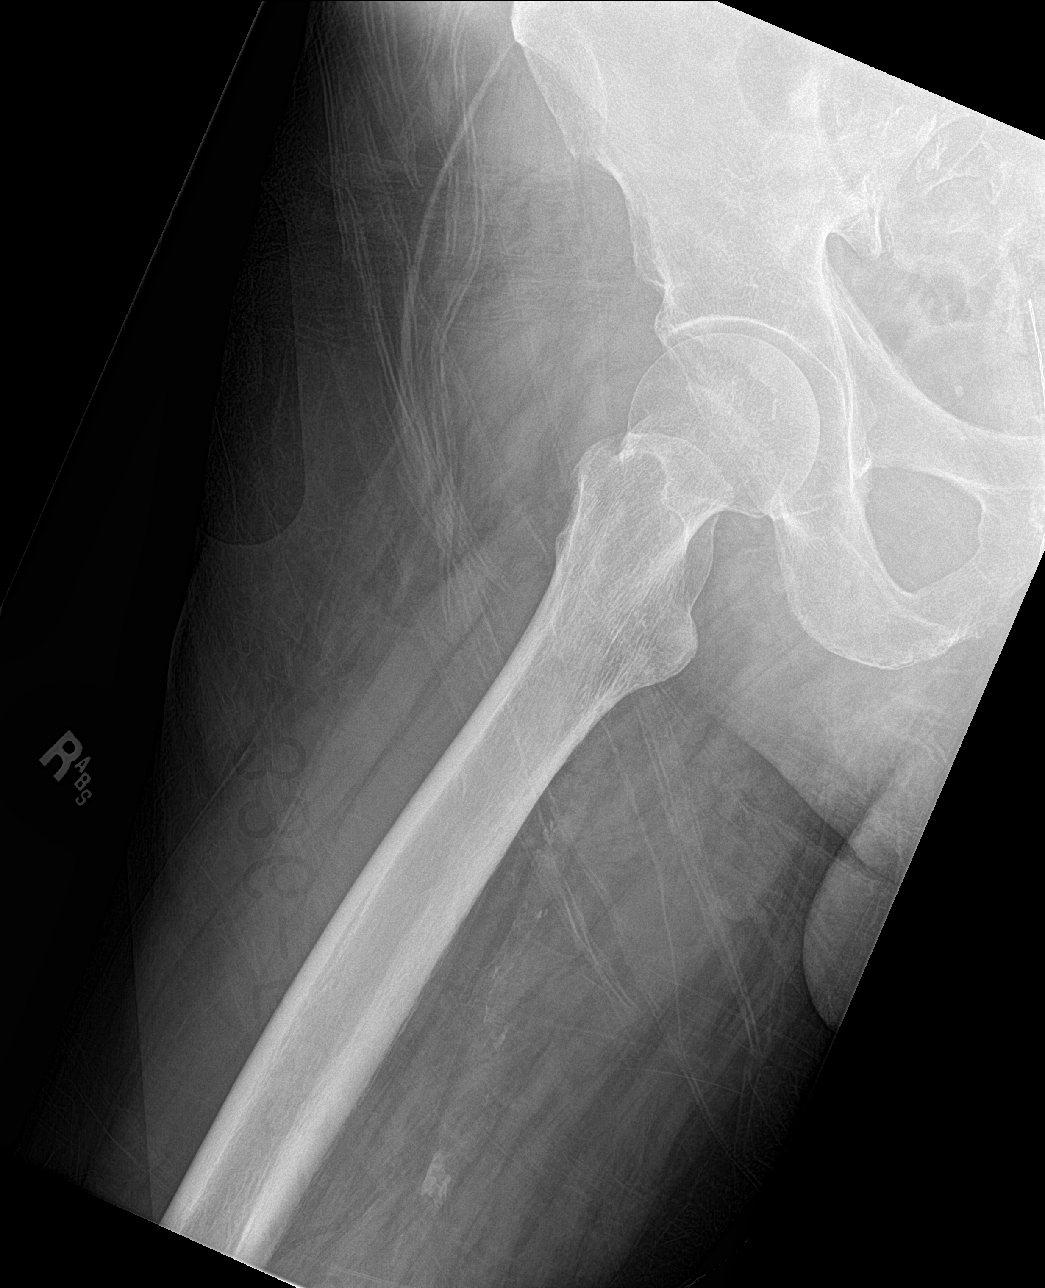

[femur lat (2 of 2)]
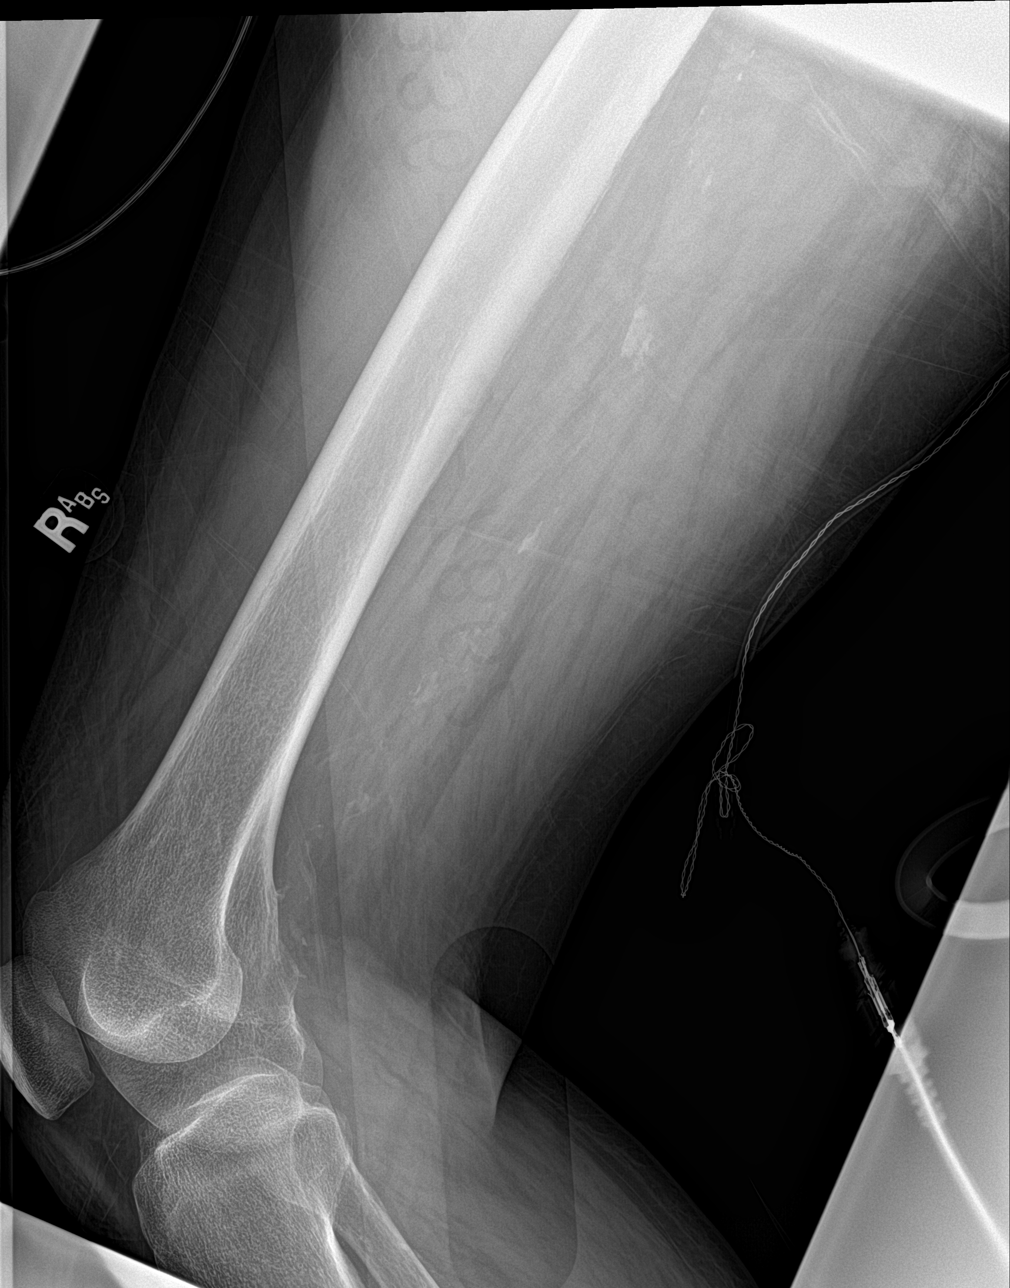

[4 of 4 positions shown; findings below may reference images not displayed]

FINDINGS: Views of the right femur show no fracture. Alignment is normal. No
significant degenerative change is seen.
IMPRESSION: Negative.

## 2017-11-02 IMAGING — CR DG TIBIA/FIBULA 2V*L*
3 series · 3 of 3 positions shown · non-contrast
Comparison: None.

CLINICAL DATA: Found unresponsive on floor at home, probable fall

EXAM:
LEFT TIBIA AND FIBULA - 2 VIEW

[tibia ap]
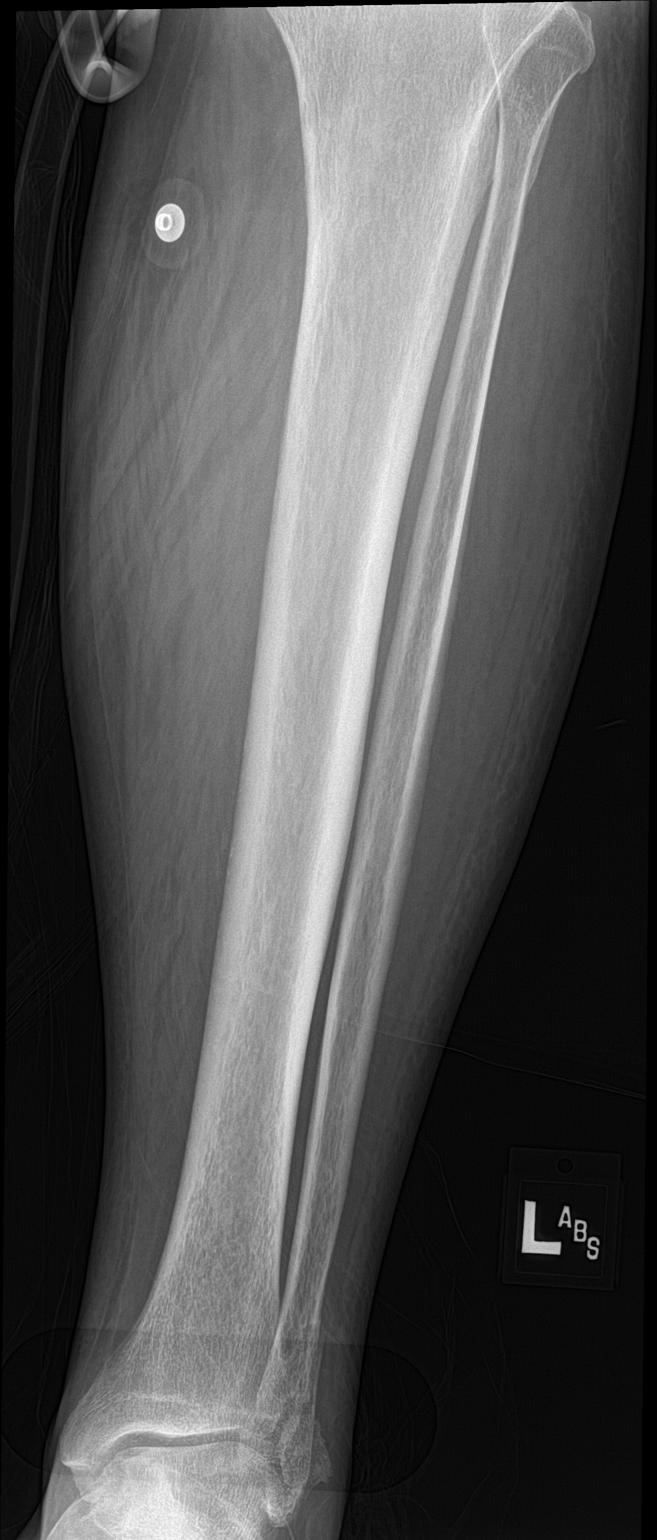

[tibia lat (1 of 2)]
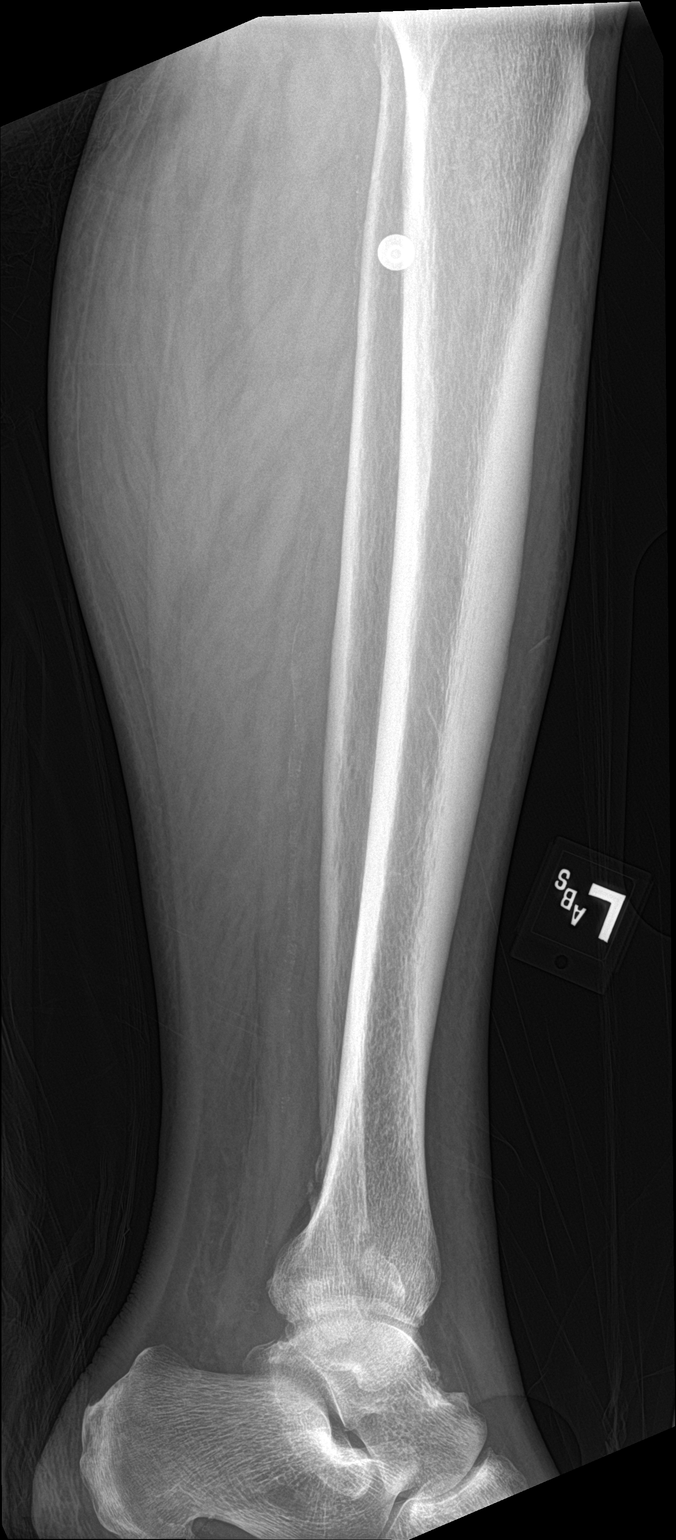

[tibia lat (2 of 2)]
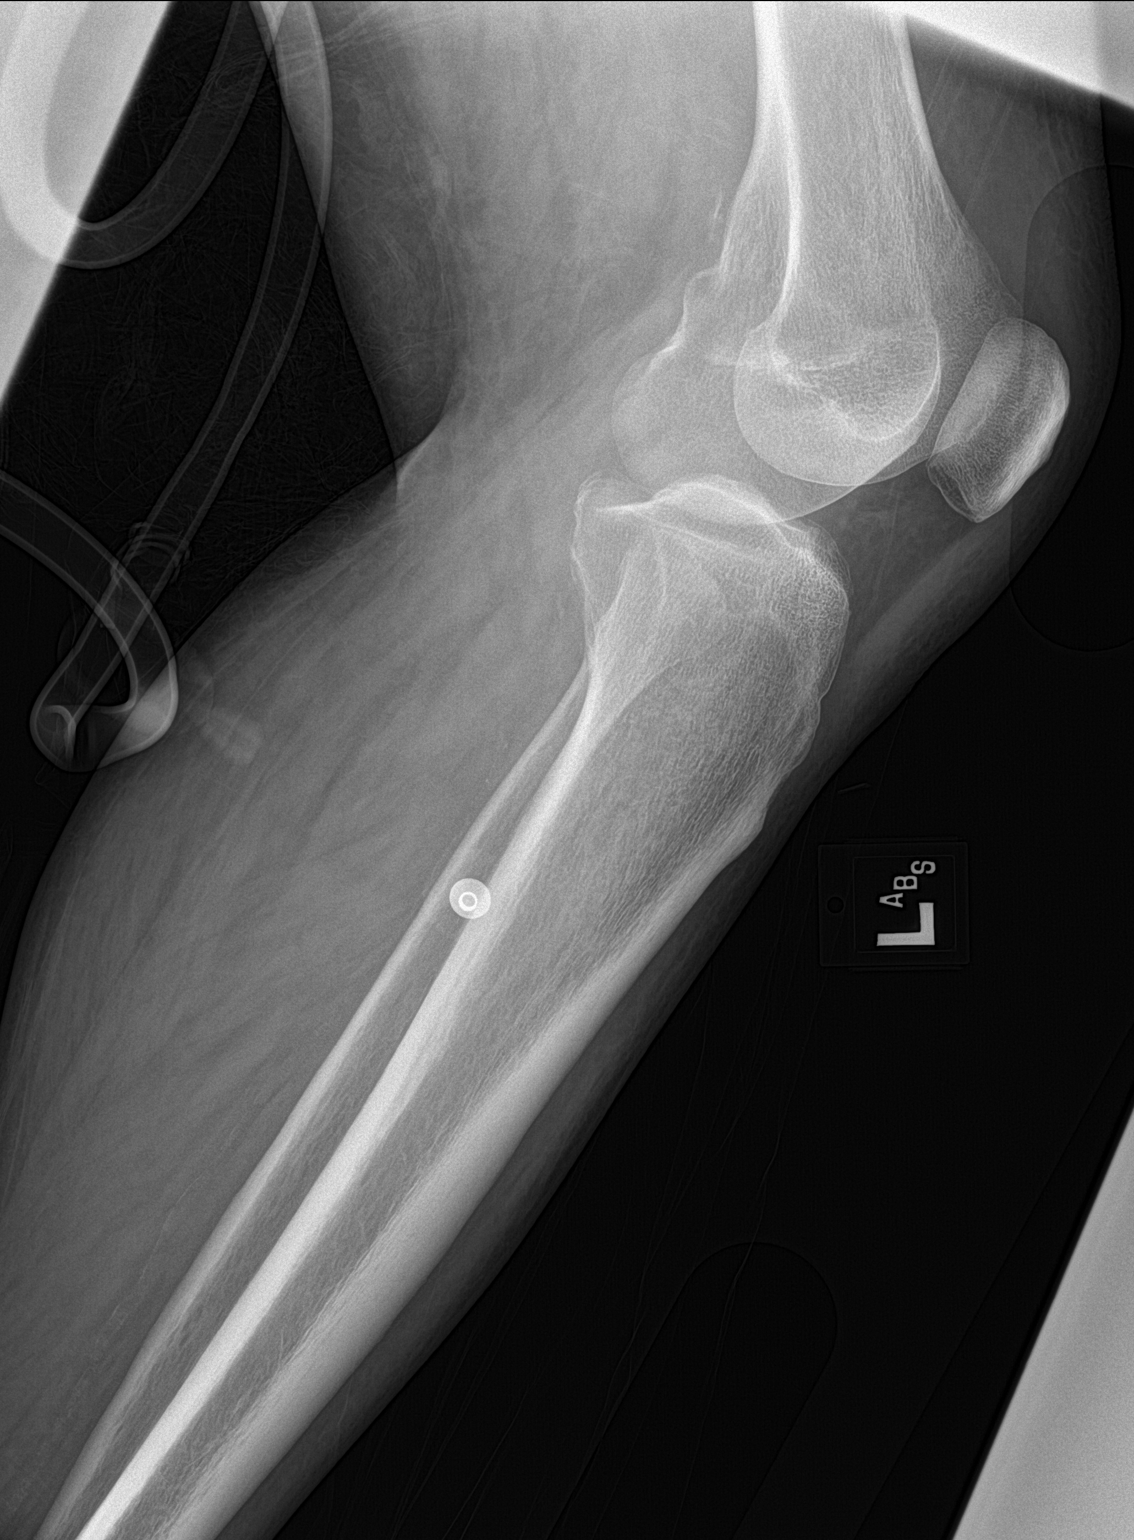

[3 of 3 positions shown; findings below may reference images not displayed]

FINDINGS: Views of the left tibia and fibula were obtained. There does appear
to be an abnormal bony density adjacent to the distal left fibula
which may represent fracture. Views of the left ankle may be helpful
to evaluate further. The ankle joint appears normal.
IMPRESSION: Suspect fracture of the distal left fibula. Consider left ankle
views.

## 2017-11-02 IMAGING — CR DG FOREARM 2V*R*
2 series · 2 of 2 positions shown · non-contrast
Comparison: None.

CLINICAL DATA: Unresponsive, multiple bruises

EXAM:
RIGHT FOREARM - 2 VIEW

[forearm ap]
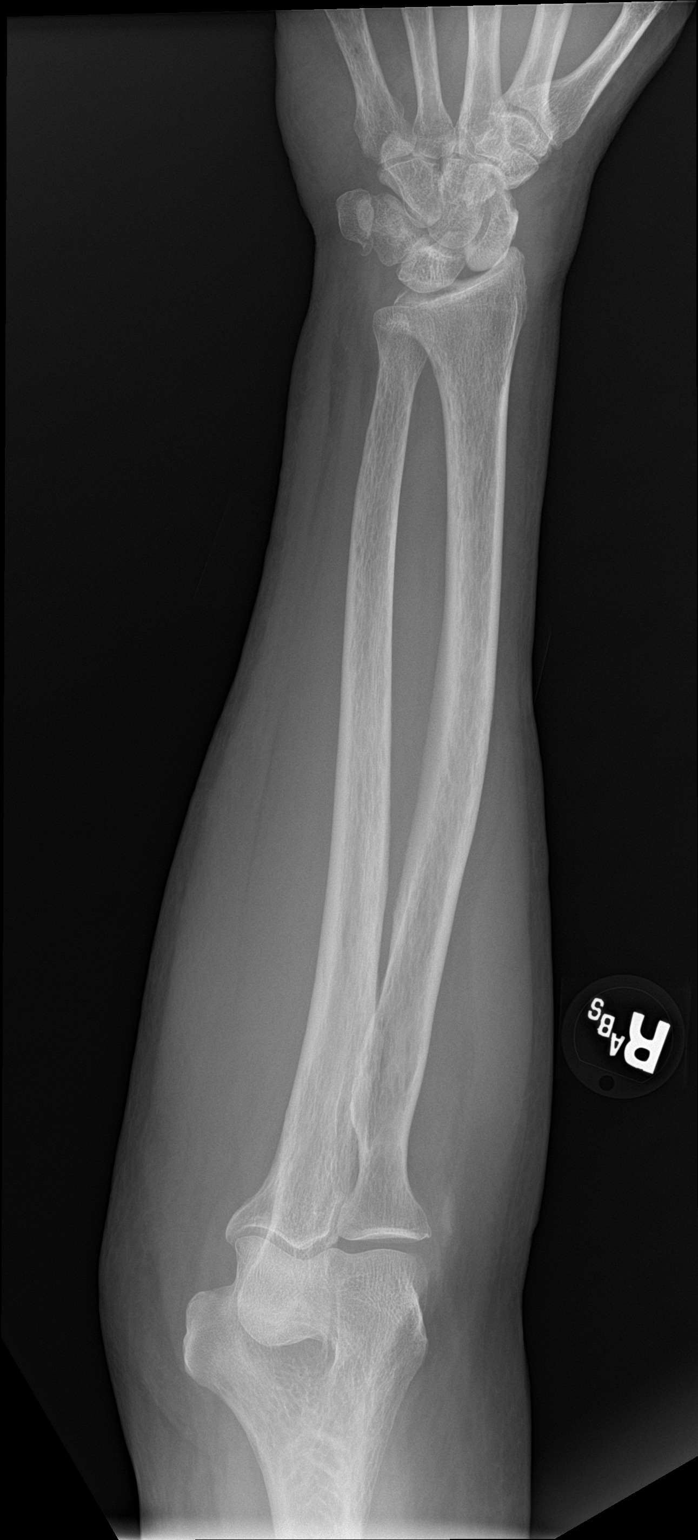

[forearm lat]
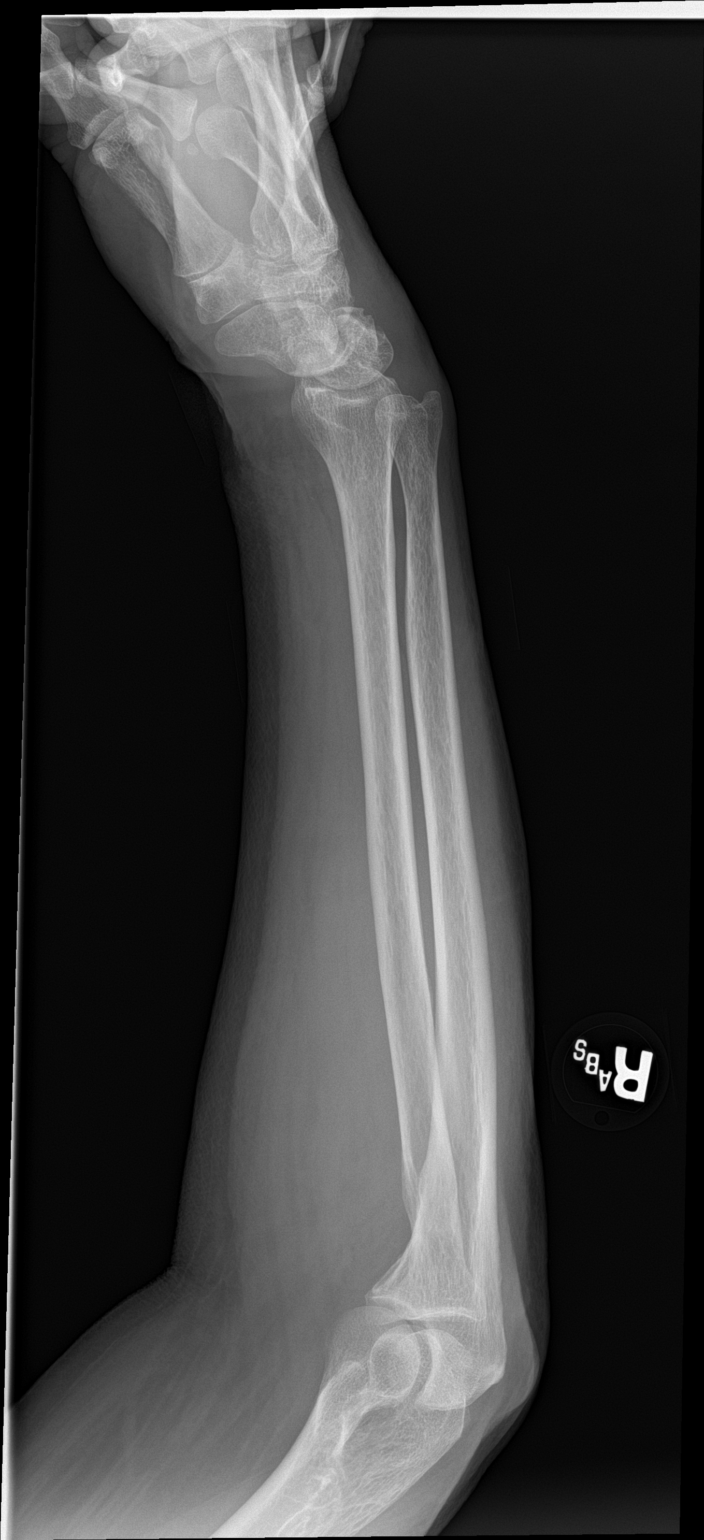

[2 of 2 positions shown; findings below may reference images not displayed]

FINDINGS: No fracture or dislocation is seen.

The joint spaces are preserved.

Visualized soft tissues are within normal limits.
IMPRESSION: No fracture or dislocation is seen.

## 2017-11-02 IMAGING — CR DG HUMERUS 2V *R*
2 series · 3 of 3 positions shown · non-contrast
Comparison: Right shoulder radiographs dated 04/07/2014

CLINICAL DATA: Unresponsive, multiple bruises

EXAM:
RIGHT HUMERUS - 2+ VIEW

[Series 1: humerus ap · 0.14mm/px · 2 of 2 slices shown]
[im 1/2]
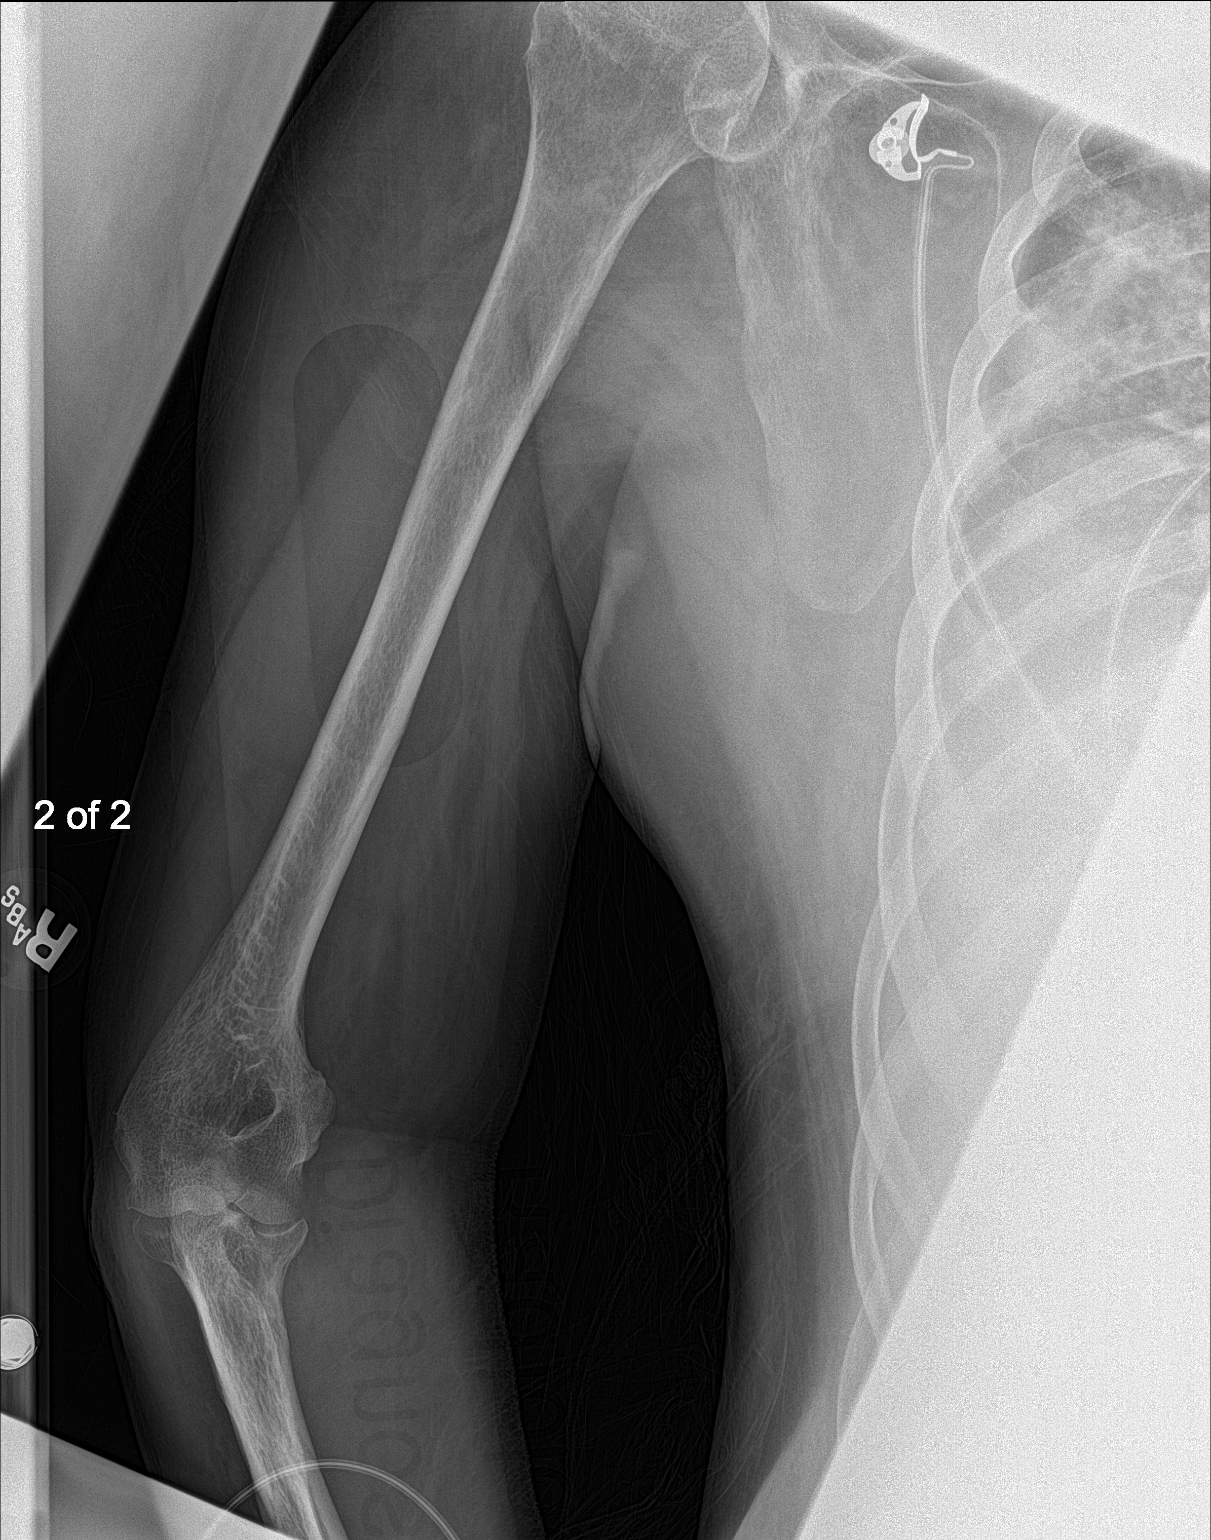
[im 2/2]
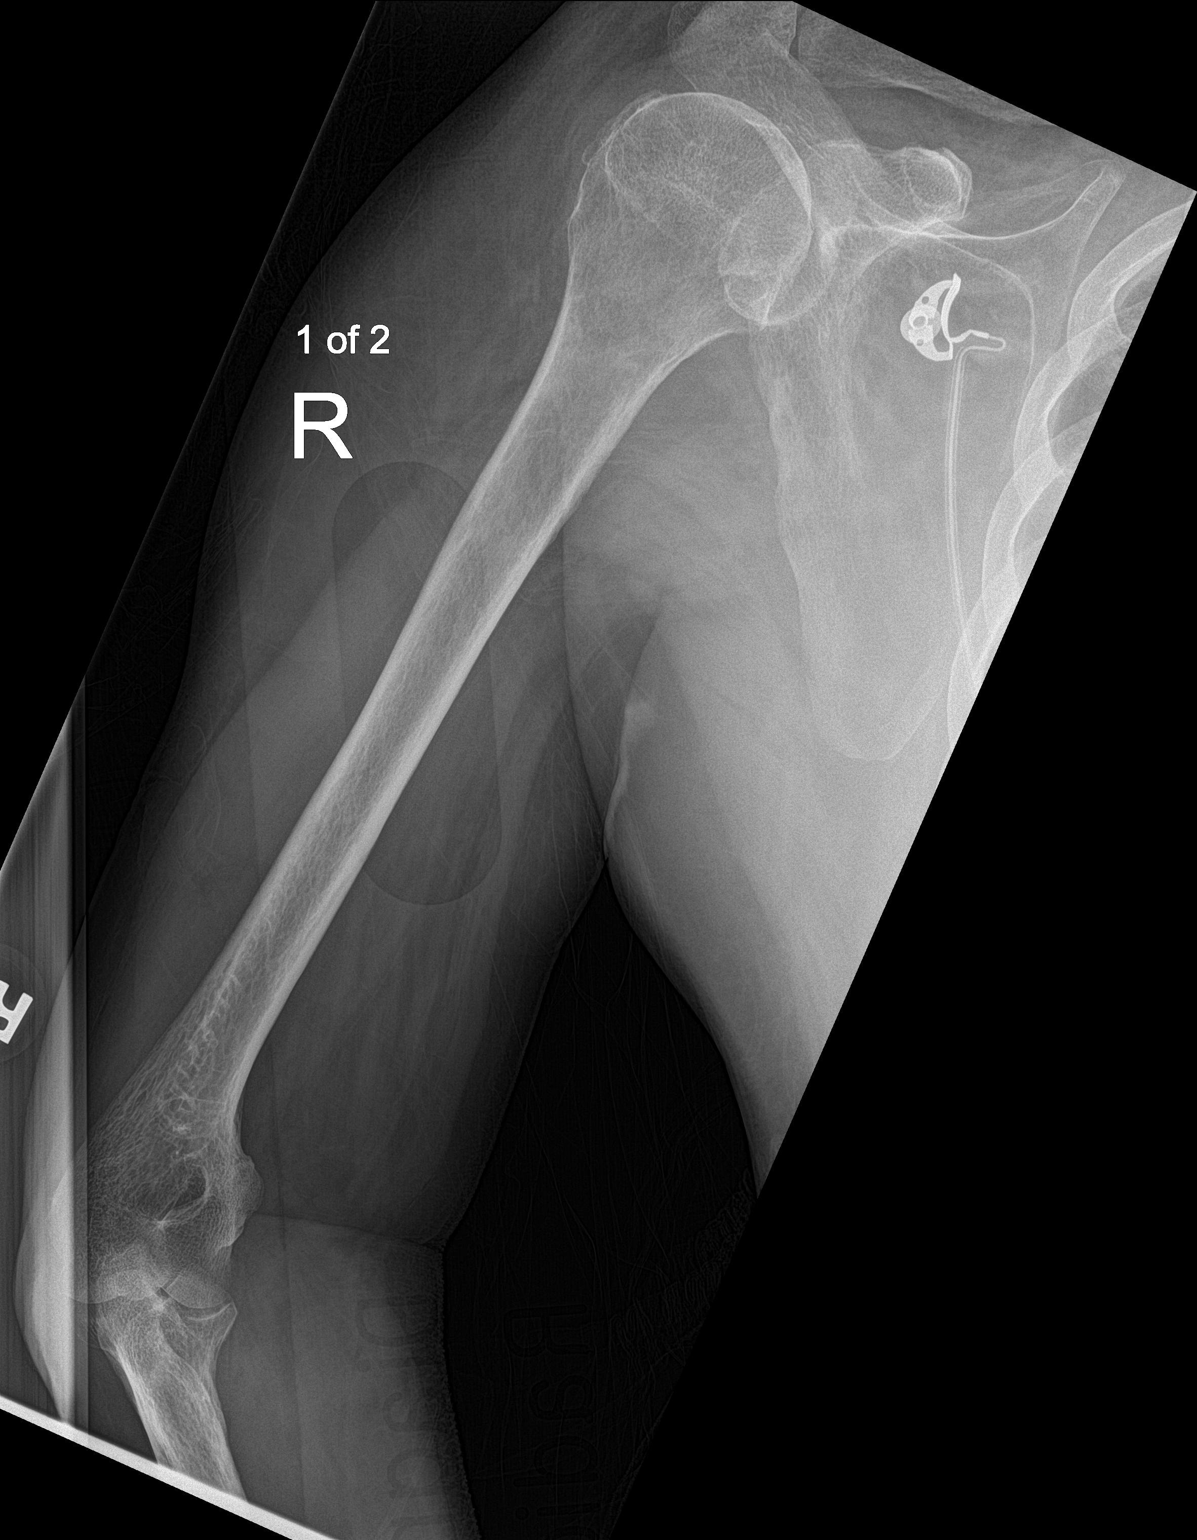

[humerus lat]
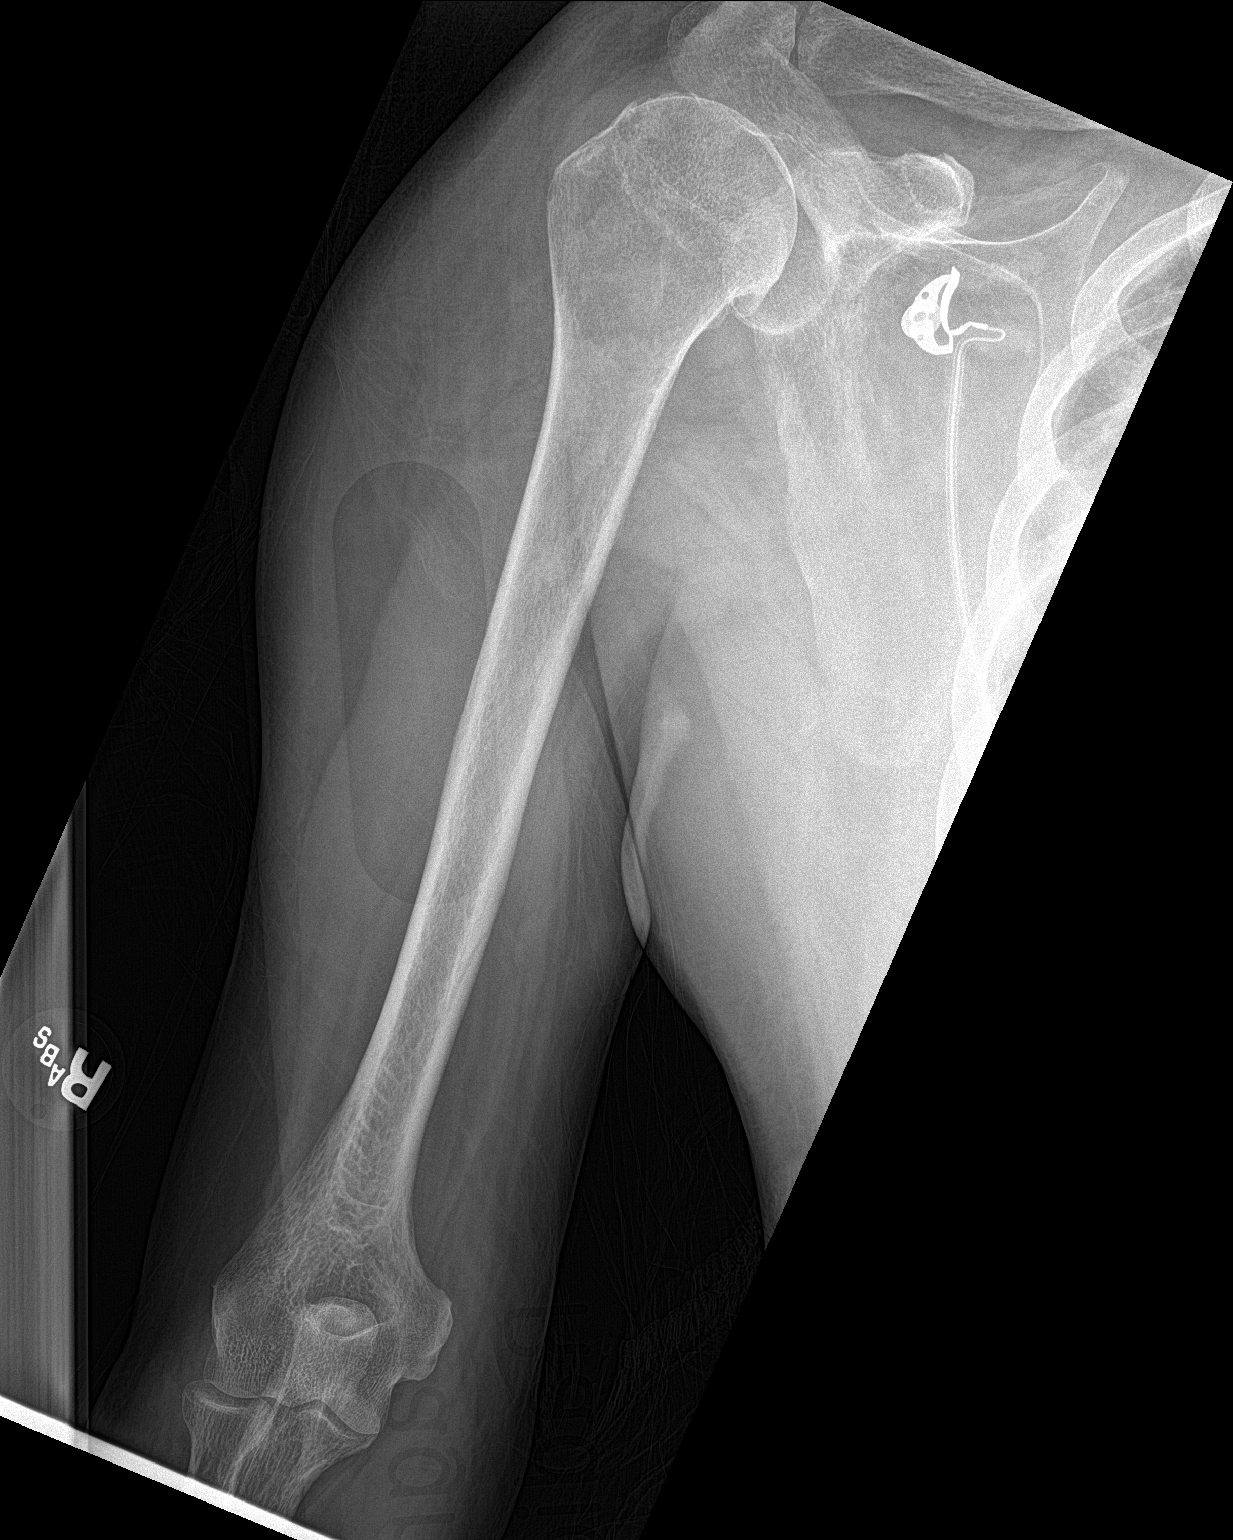

[3 of 3 positions shown; findings below may reference images not displayed]

FINDINGS: No fracture or dislocation is seen.

The joint spaces are preserved.

Visualized soft tissues are within normal limits.
IMPRESSION: No fracture or dislocation is seen.

## 2017-11-02 IMAGING — CR DG HUMERUS 2V *L*
2 series · 2 of 2 positions shown · non-contrast
Comparison: None.

CLINICAL DATA: Found unresponsive at home on floor. Fall. Trauma.
Deformity. Initial encounter.

EXAM:
LEFT HUMERUS - 2+ VIEW

[humerus ap]
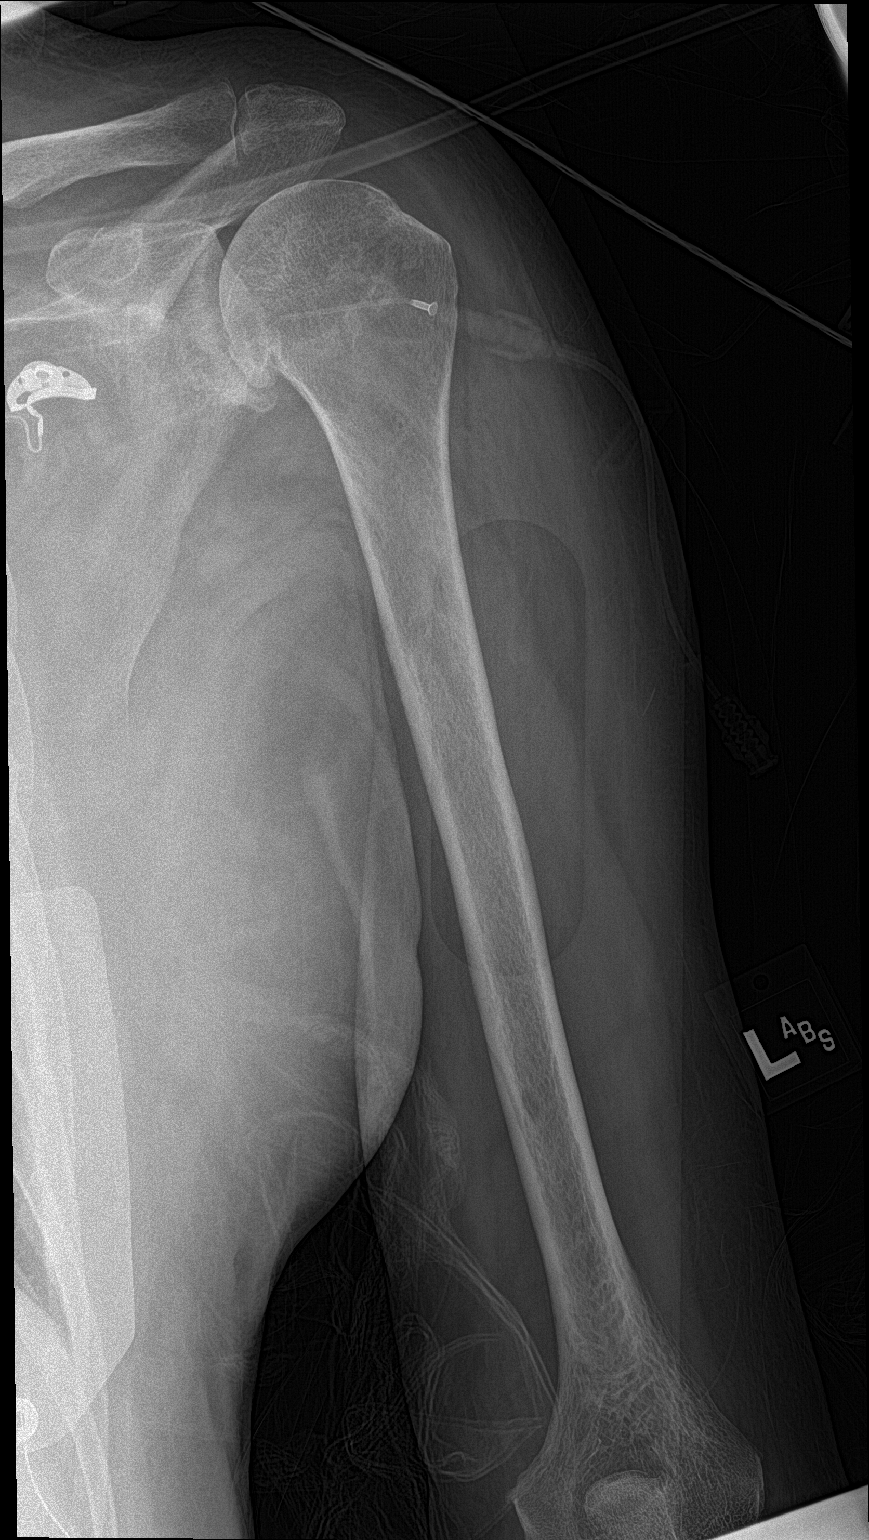

[humerus lat]
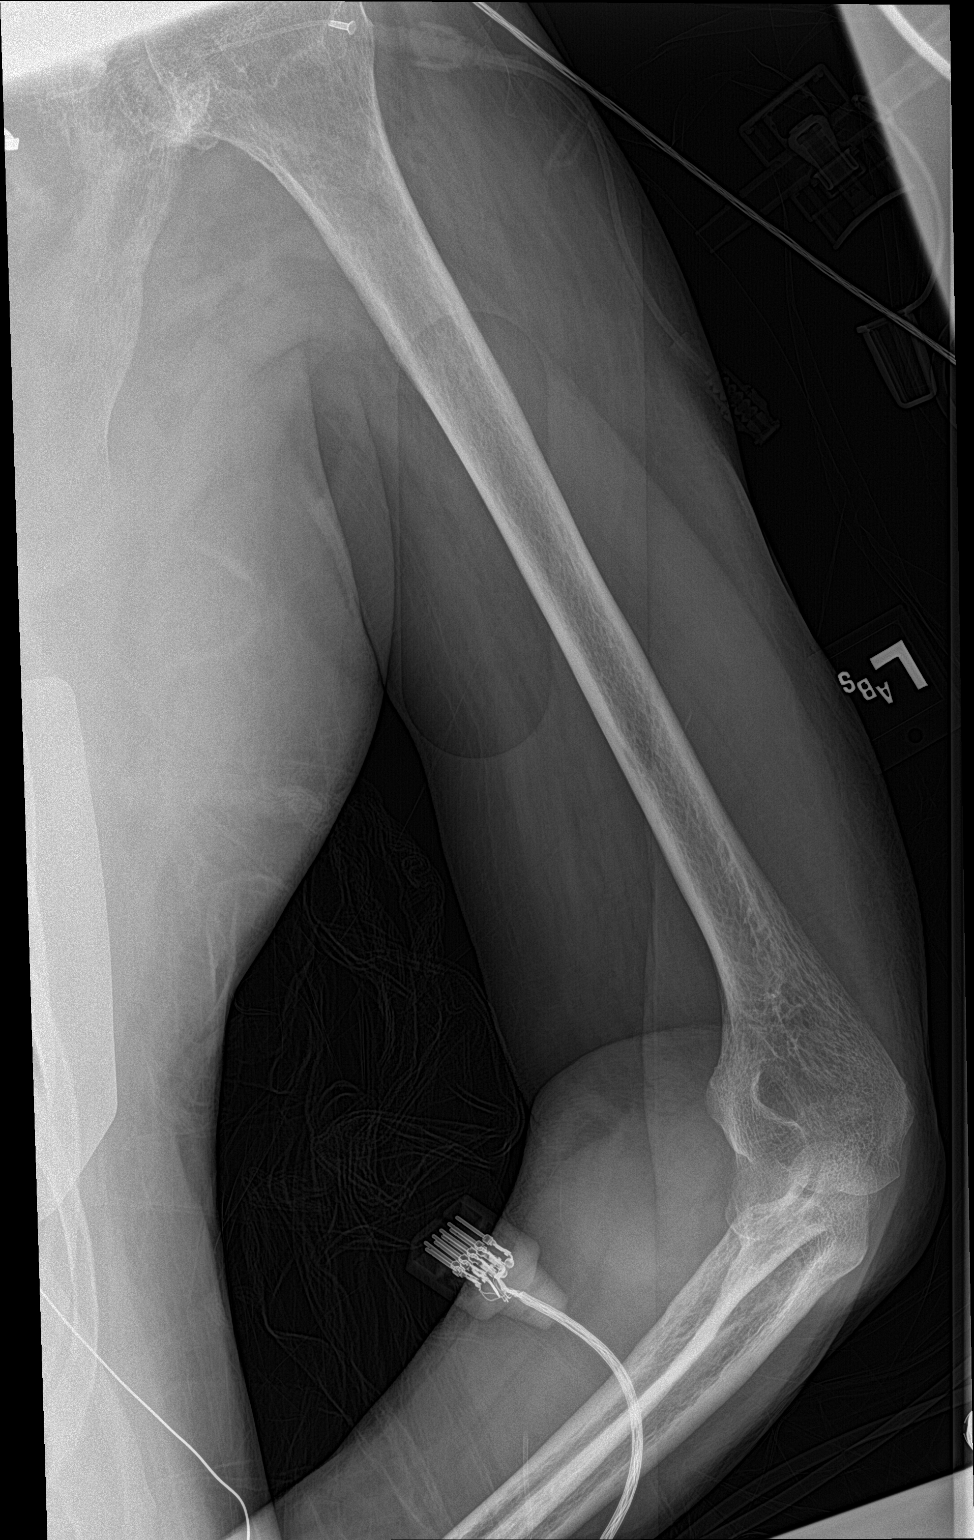

[2 of 2 positions shown; findings below may reference images not displayed]

FINDINGS: Degenerative glenohumeral joint space narrowing and marginal
spurring are noted. The humerus appears intact without evidence of
fracture. No focal osseous lesion or soft tissue abnormality is
seen.
IMPRESSION: No evidence of acute osseous abnormality.

## 2017-11-02 IMAGING — CT CT CHEST W/ CM
2 of 5 series · 13 of 36 positions shown, 16 images · IV contrast (iopamidol)
Comparison: CT 04/16/2015

CLINICAL DATA: Found unresponsive at home. Time down unknown. No
one with pt to provide

EXAM:
CT CHEST, ABDOMEN, AND PELVIS WITH CONTRAST
TECHNIQUE: Multidetector CT imaging of the chest, abdomen and pelvis was
performed following the standard protocol during bolus
administration of intravenous contrast.
CONTRAST:  100mL LYOAML-LTT IOPAMIDOL (LYOAML-LTT) INJECTION 61%

[Series 2: cap with · axial · 0.86mm/px · z∈[-866,-281]mm · 10 of 143 slices shown, 13 images]
[im 13/143  mediastinal]
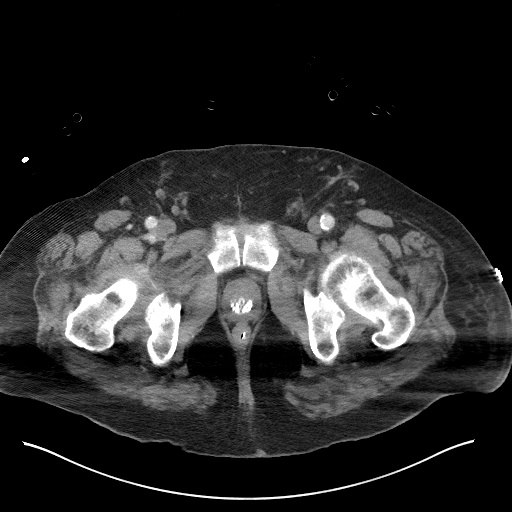
[im 13/143  lung]
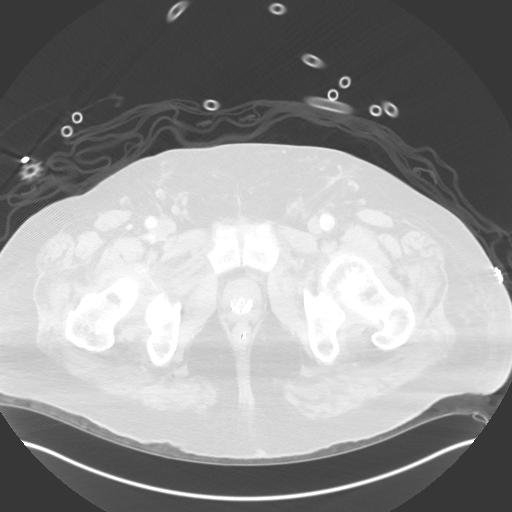
[im 26/143  lung]
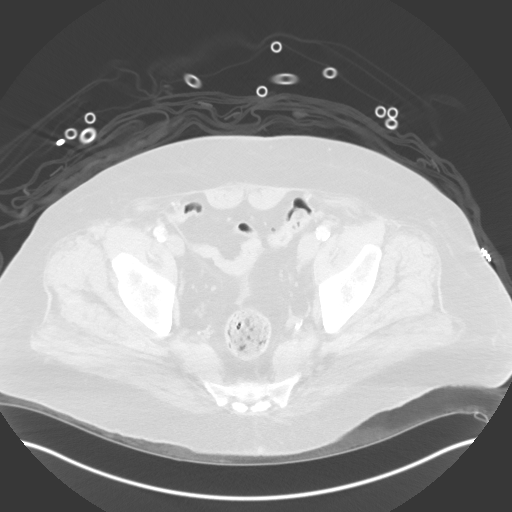
[im 39/143  lung]
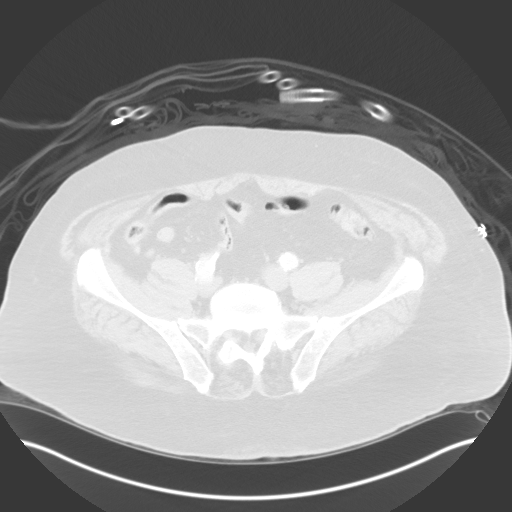
[im 52/143  lung]
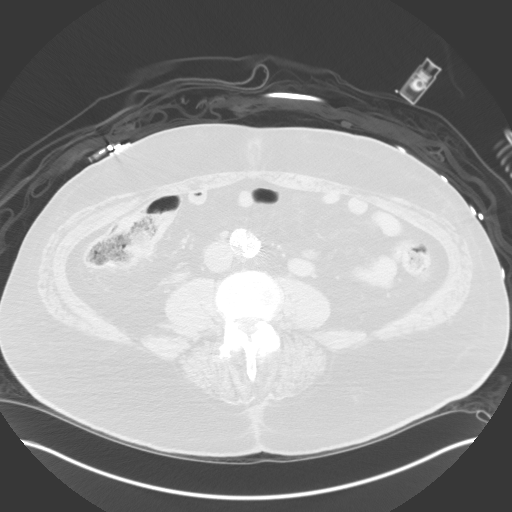
[im 65/143  mediastinal]
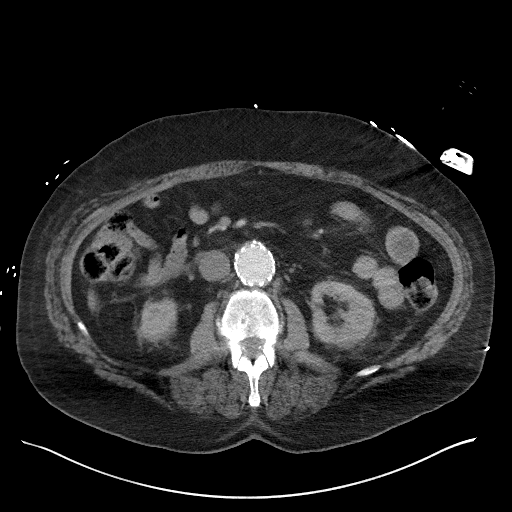
[im 65/143  lung]
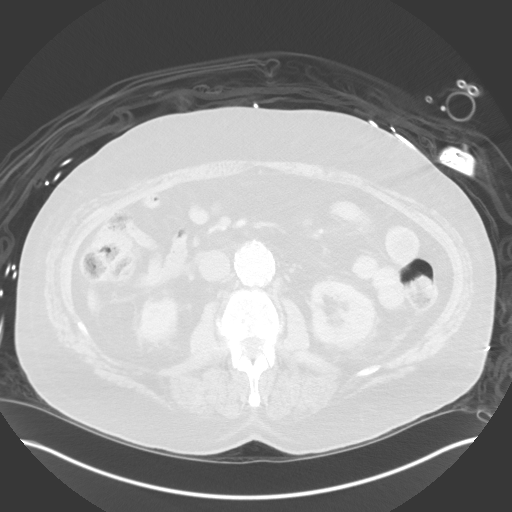
[im 78/143  lung]
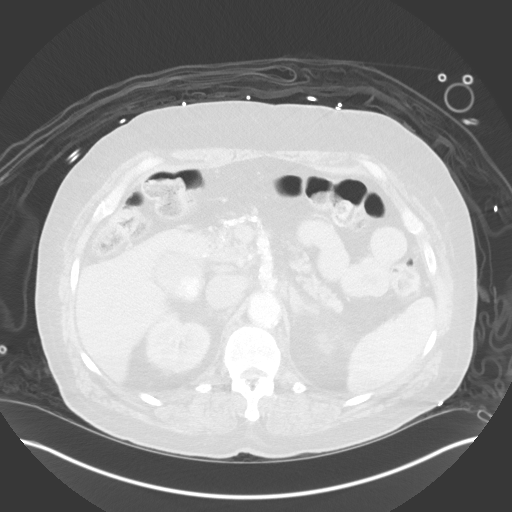
[im 91/143  lung]
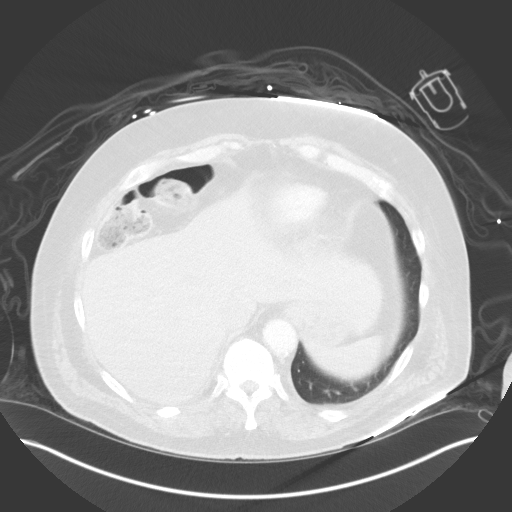
[im 104/143  lung]
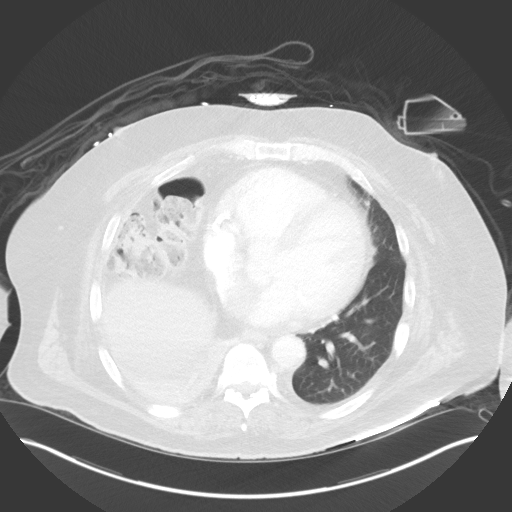
[im 117/143  mediastinal]
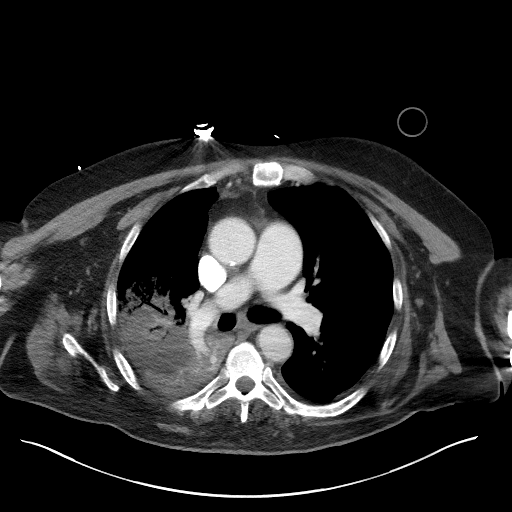
[im 117/143  lung]
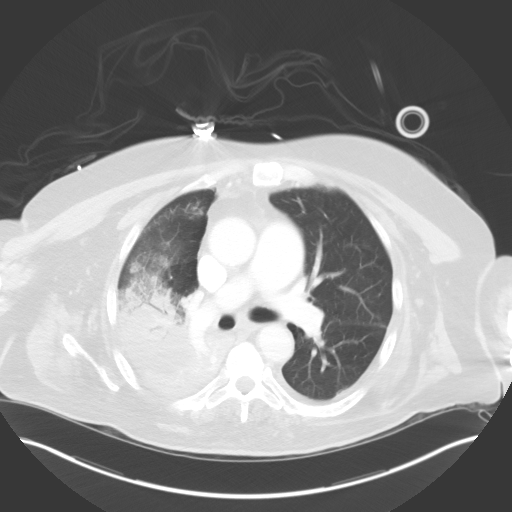
[im 130/143  lung]
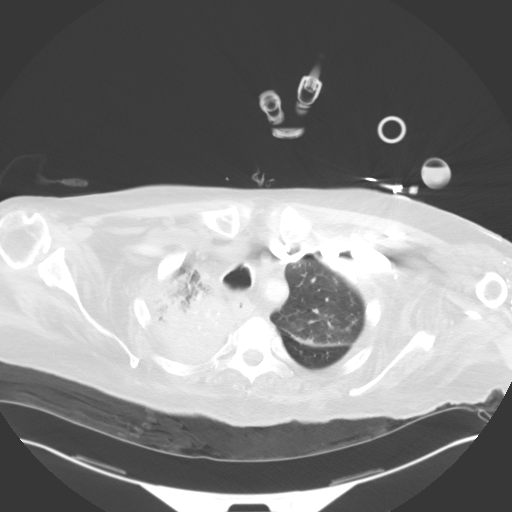

[Series 5: coronals · coronal · 0.84mm/px · 3 of 155 slices shown]
[im 31/155  lung]
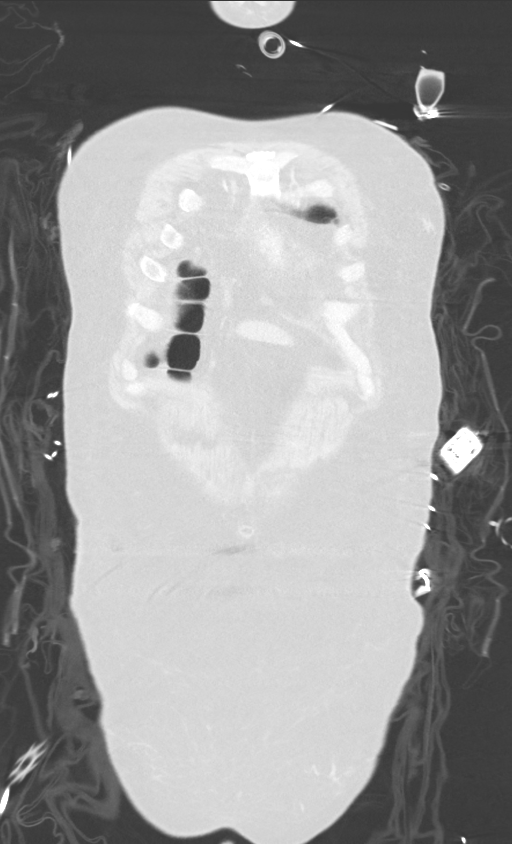
[im 62/155  lung]
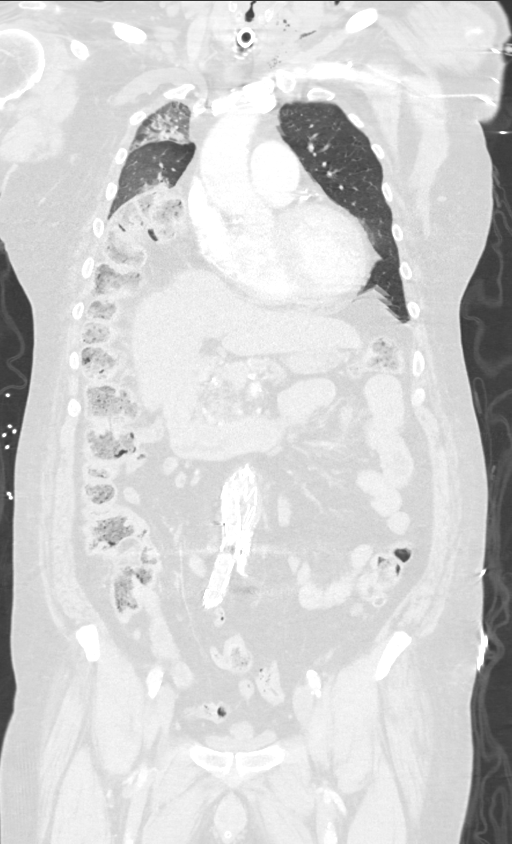
[im 93/155  lung]
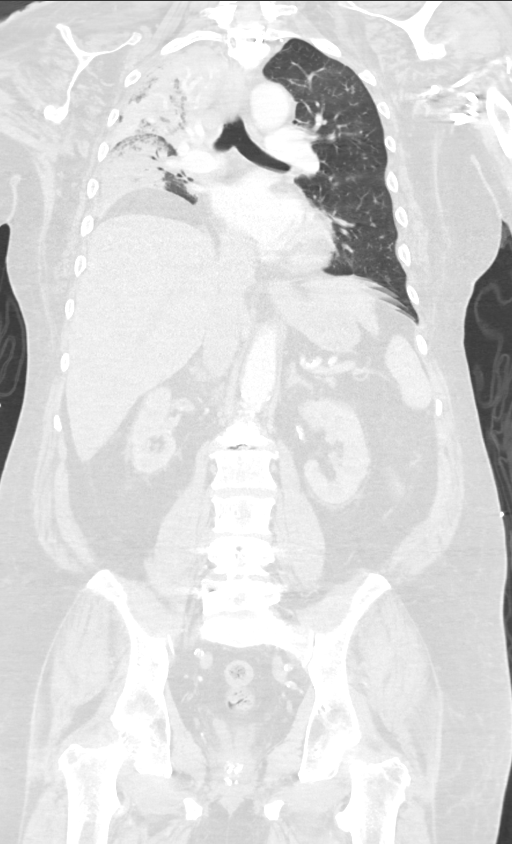

[13 of 36 positions shown; findings below may reference images not displayed]

FINDINGS: CT CHEST FINDINGS

Cardiovascular: Coronary artery calcification and aortic
atherosclerotic calcification.

Mediastinum/Nodes: No axillary or supraclavicular lymphadenopathy.
Small a gas in the soft tissues of the thoracic inlet adjacent to
strap muscles (image 5, series 2 and related to recent tracheostomy.

Tracheostomy tube in good position.

No mediastinal lymphadenopathy. No pericardial fluid. Esophagus
normal.

Lungs/Pleura: Dense consolidation within the RIGHT lower lobe and
patchy consolidation RIGHT middle lobe and RIGHT upper lobe. Small
effusion extends along the oblique fissure in the RIGHT hemi thorax.

The RIGHT hemidiaphragm is elevated with a loop of colon beneath the
RIGHT hemidiaphragm. The LEFT lung is relatively clear.

The RIGHT lower lobe bronchus is difficult to follow. The bronchus
intermedius is patent. The RIGHT middle lobe and RIGHT upper lobe
bronchi are patent. Trachea normal.

There is a tracheostomy tube in place.

Musculoskeletal: No acute finding

CT ABDOMEN PELVIS FINDINGS

Hepatobiliary: No focal hepatic lesion. Large gallstone within lumen
gallbladder without gallbladder distension. There is small amount of
pericholecystic fluid.

Pancreas: Normal pancreas

Spleen: Normal spleen

Adrenals/Urinary Tract: Adrenal glands and kidneys are normal.
Ureters normal. Foley catheter in the bladder.

Stomach/Bowel: Stomach, small bowel, appendix, cecum normal. The
colon and rectosigmoid colon are normal.

Vascular/Lymphatic: Abdominal aortic stent graft in place and well
opacified.

Reproductive: Prostate small

Other: No adenopathy in the abdomen pelvis.

Musculoskeletal: No aggressive osseous lesion. Posterior lumbar
fusion noted
IMPRESSION: 1. Dense consolidation within the RIGHT lower lobe and patchy
consolidation in the RIGHT upper lobe. Differential includes multi
focal lobar pneumonia versus mucous plugging versus a combination of
both. Cannot exclude bronchogenic carcinoma but less favored.
Patient may benefit from bronchoscopy.
2. RIGHT hemidiaphragm is significantly elevated.
3. Small amount fluid around the gallbladder with gallstones.
Findings could represent acute or chronic cholecystitis.
4. Abdominal aortic stent graft in place with good opacification.

## 2017-11-02 IMAGING — CR DG TIBIA/FIBULA 2V*R*
3 series · 3 of 3 positions shown · non-contrast
Comparison: None.

CLINICAL DATA: Found unresponsive on the floor at home, probable
fall

EXAM:
RIGHT TIBIA AND FIBULA - 2 VIEW

[tibia ap]
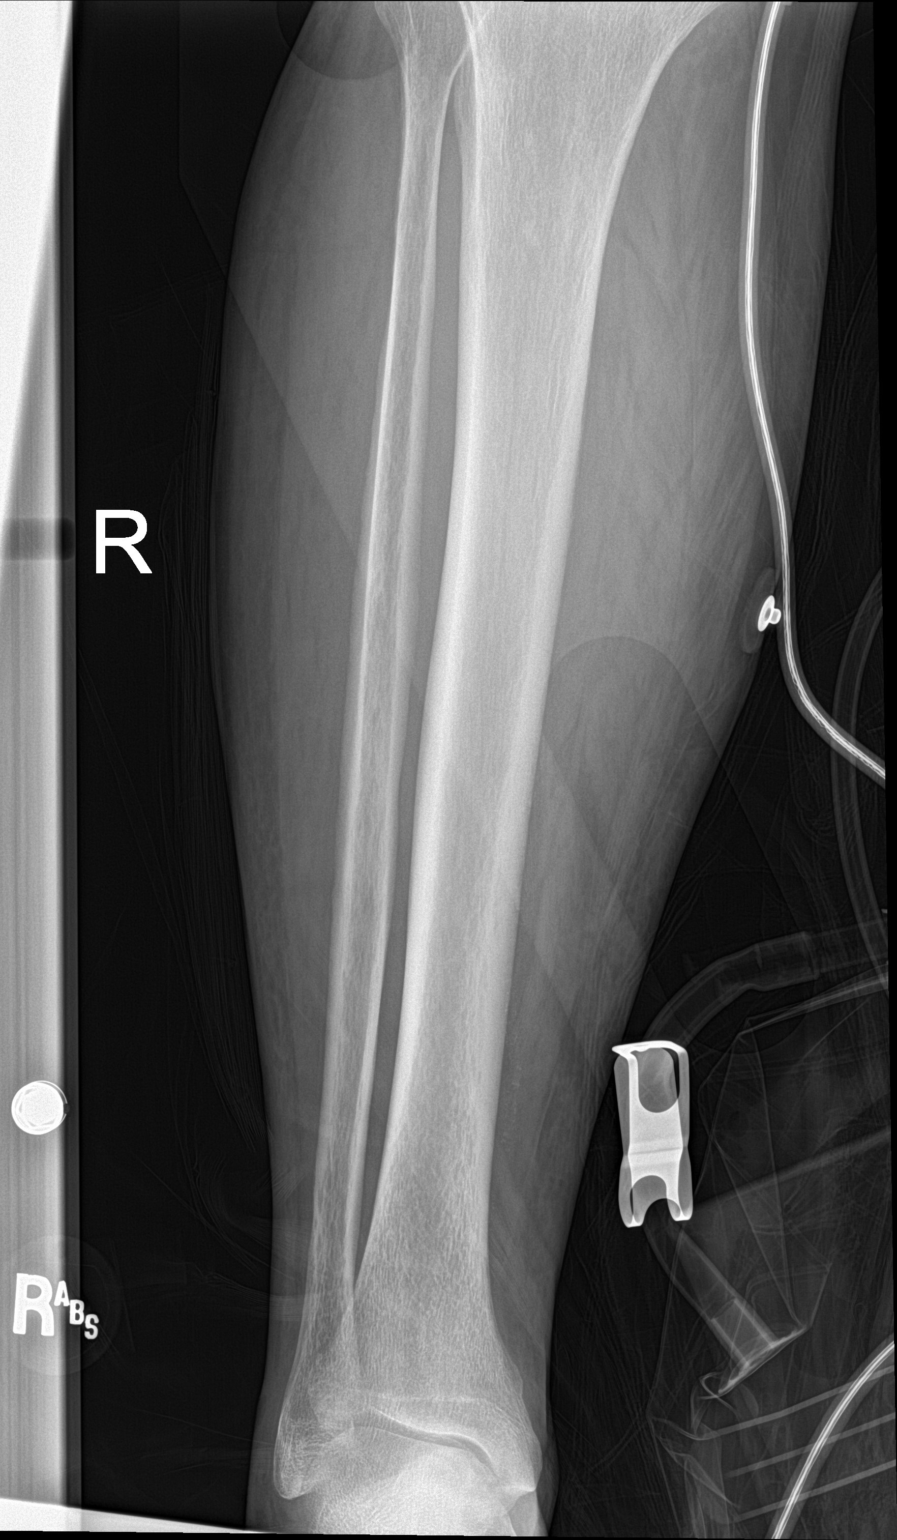

[tibia lat (1 of 2)]
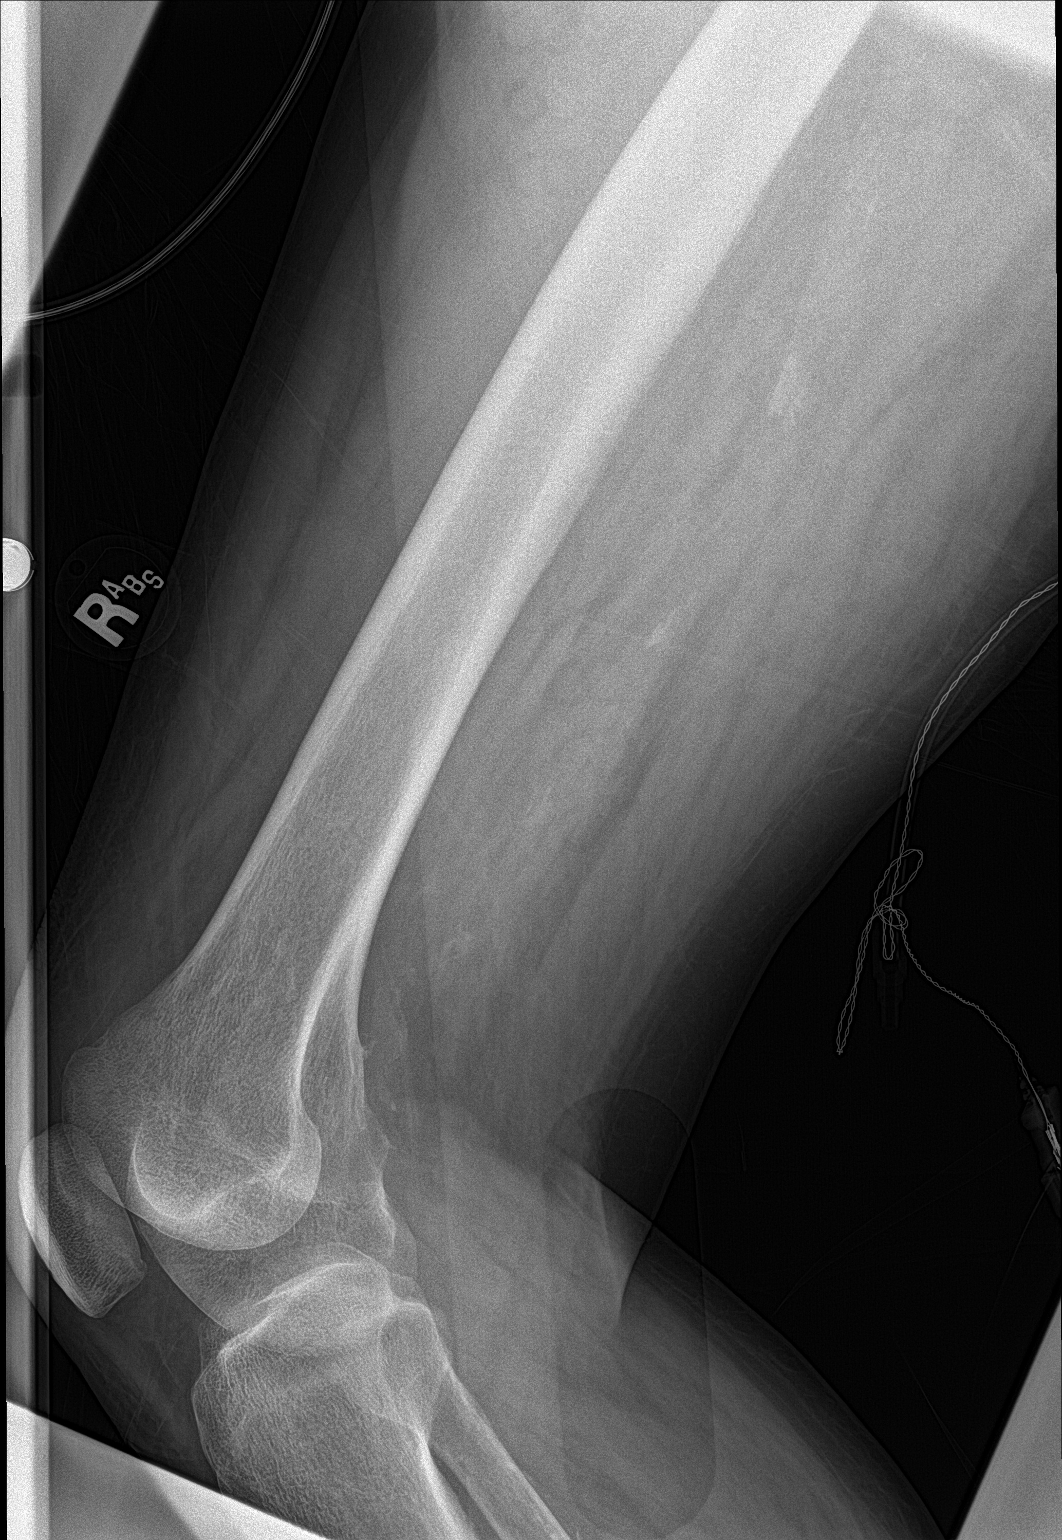

[tibia lat (2 of 2)]
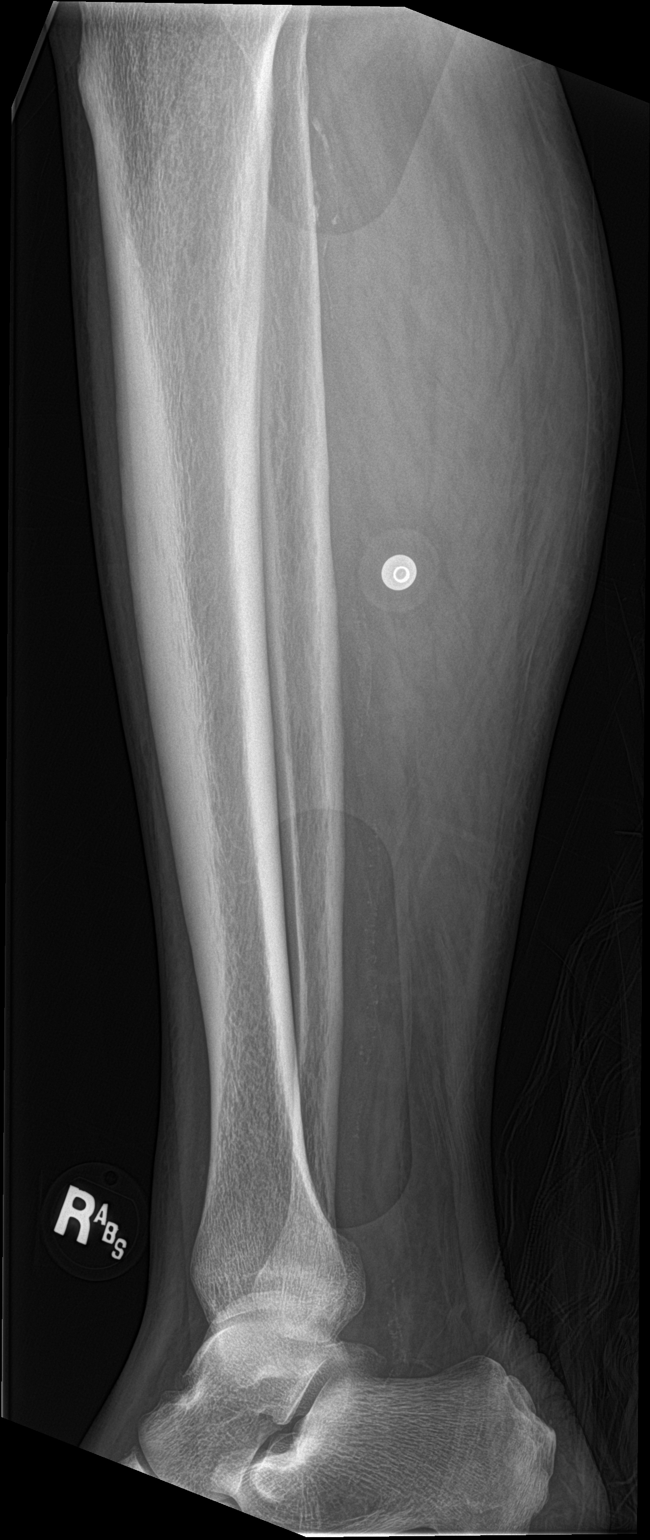

[3 of 3 positions shown; findings below may reference images not displayed]

FINDINGS: Views of the left tibia and fibula show no abnormality. No fracture
is seen. Alignment is normal.
IMPRESSION: Negative.

## 2017-11-03 IMAGING — DX DG CHEST 1V PORT
1 series · 1 of 1 positions shown · non-contrast
Comparison: 02/27/2016

CLINICAL DATA: Respiratory failure

EXAM:
PORTABLE CHEST 1 VIEW

[chest ap]
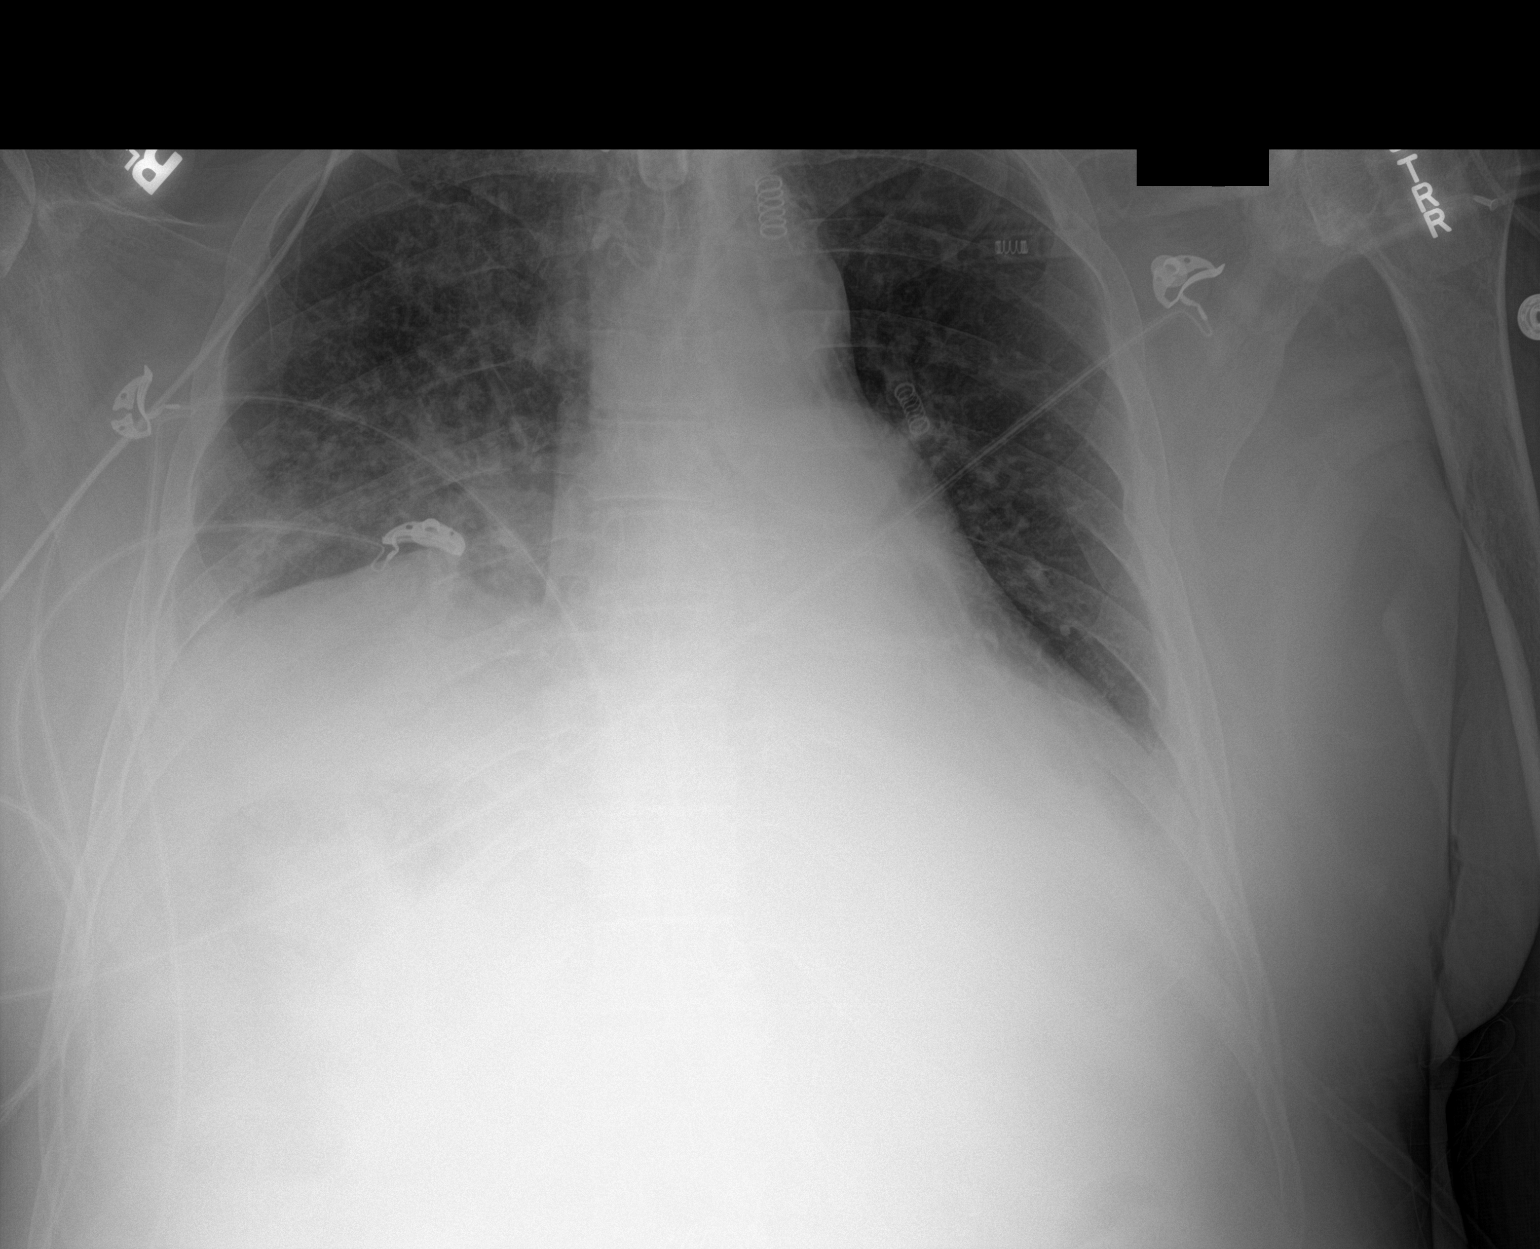

[1 of 1 positions shown; findings below may reference images not displayed]

FINDINGS: Tracheostomy appliance is in the midline.

Stable elevation of the right hemidiaphragm. Airspace consolidation
in the bases. No large effusion. No pneumothorax.
IMPRESSION: Consolidation in the bases, right greater than left, probably
unchanged from the recent CT.

## 2017-11-04 IMAGING — DX DG CHEST 1V PORT
1 series · 2 of 2 positions shown · non-contrast
Comparison: 03/04/2016

CLINICAL DATA: Difficult intubation.

EXAM:
PORTABLE CHEST 1 VIEW

[Series 1: chest ap · 0.14mm/px · 2 of 2 slices shown]
[im 1/2]
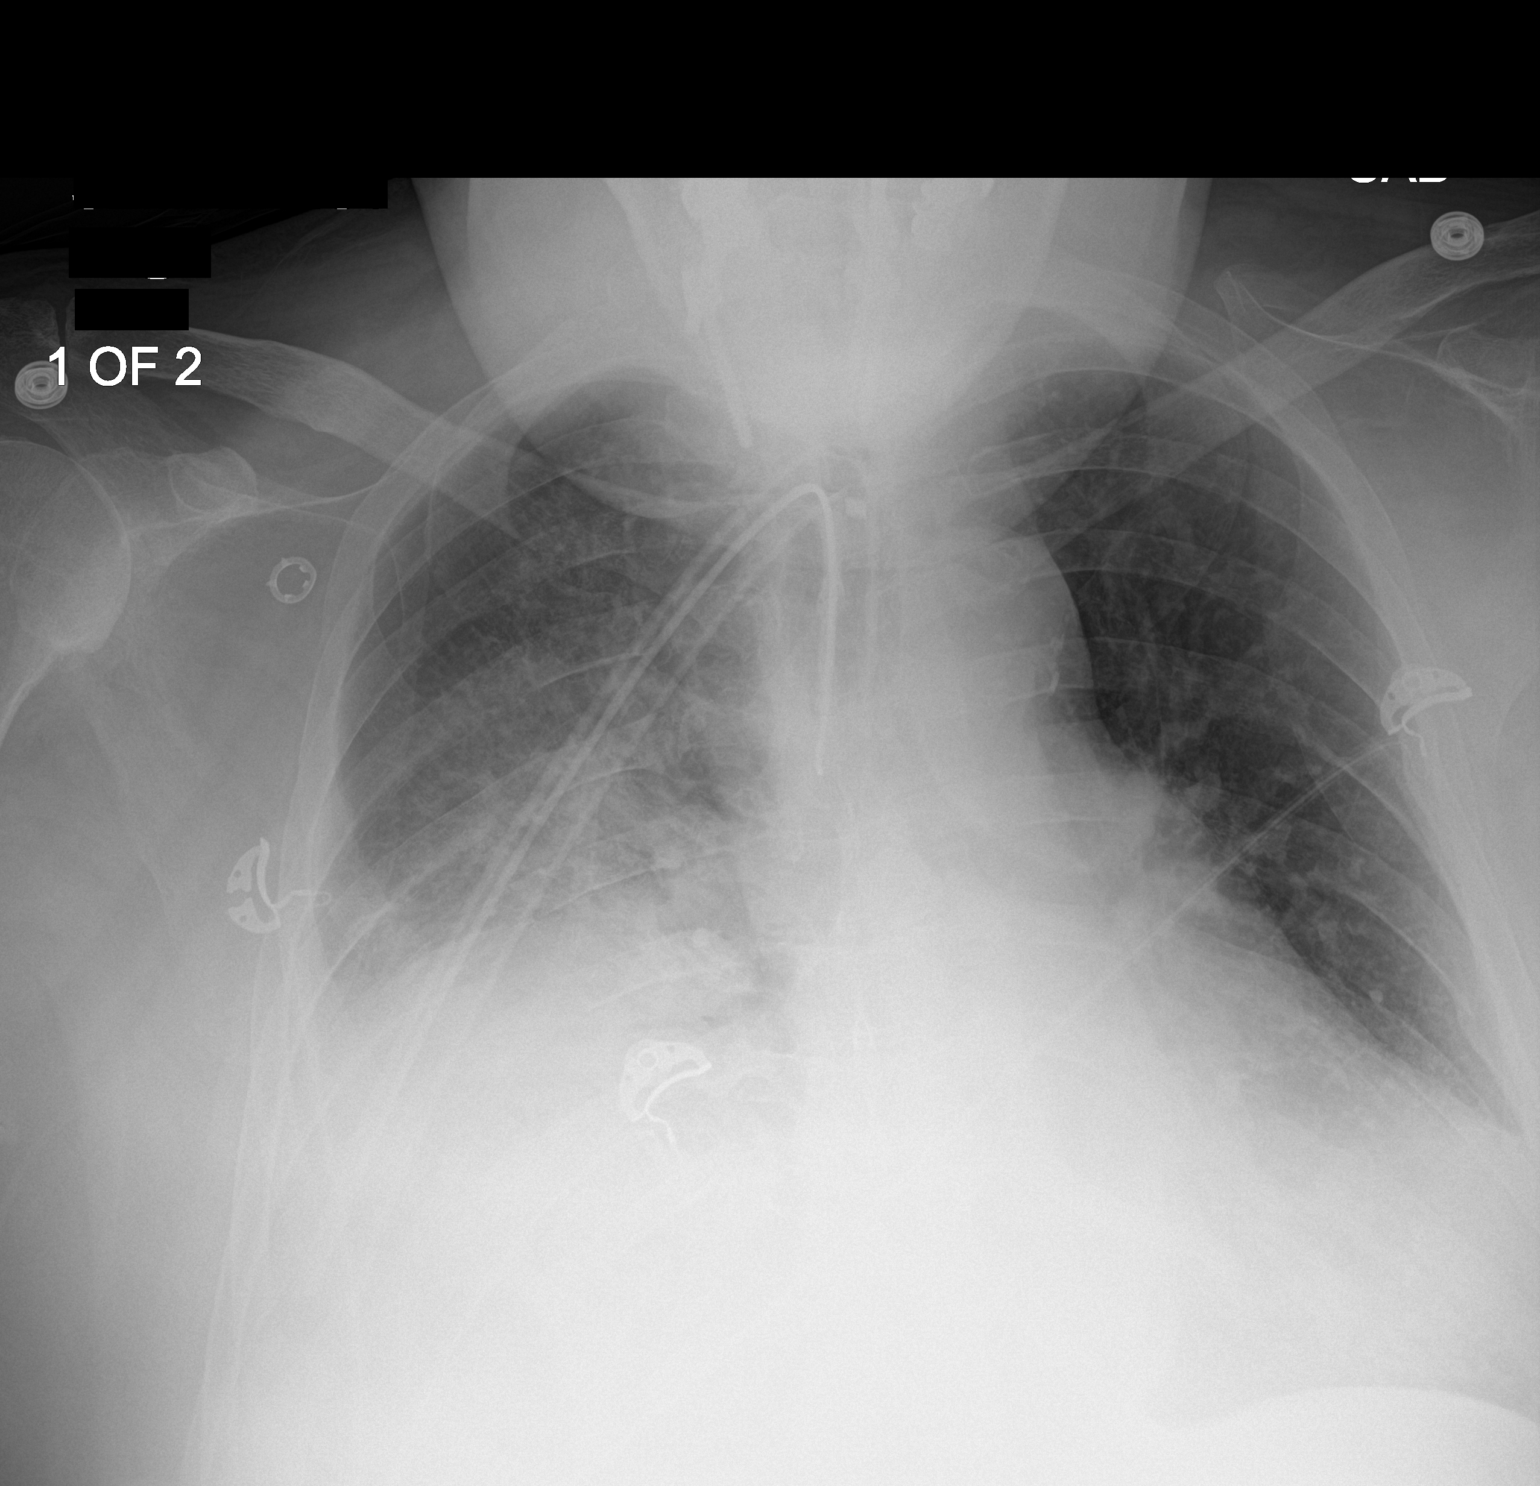
[im 2/2]
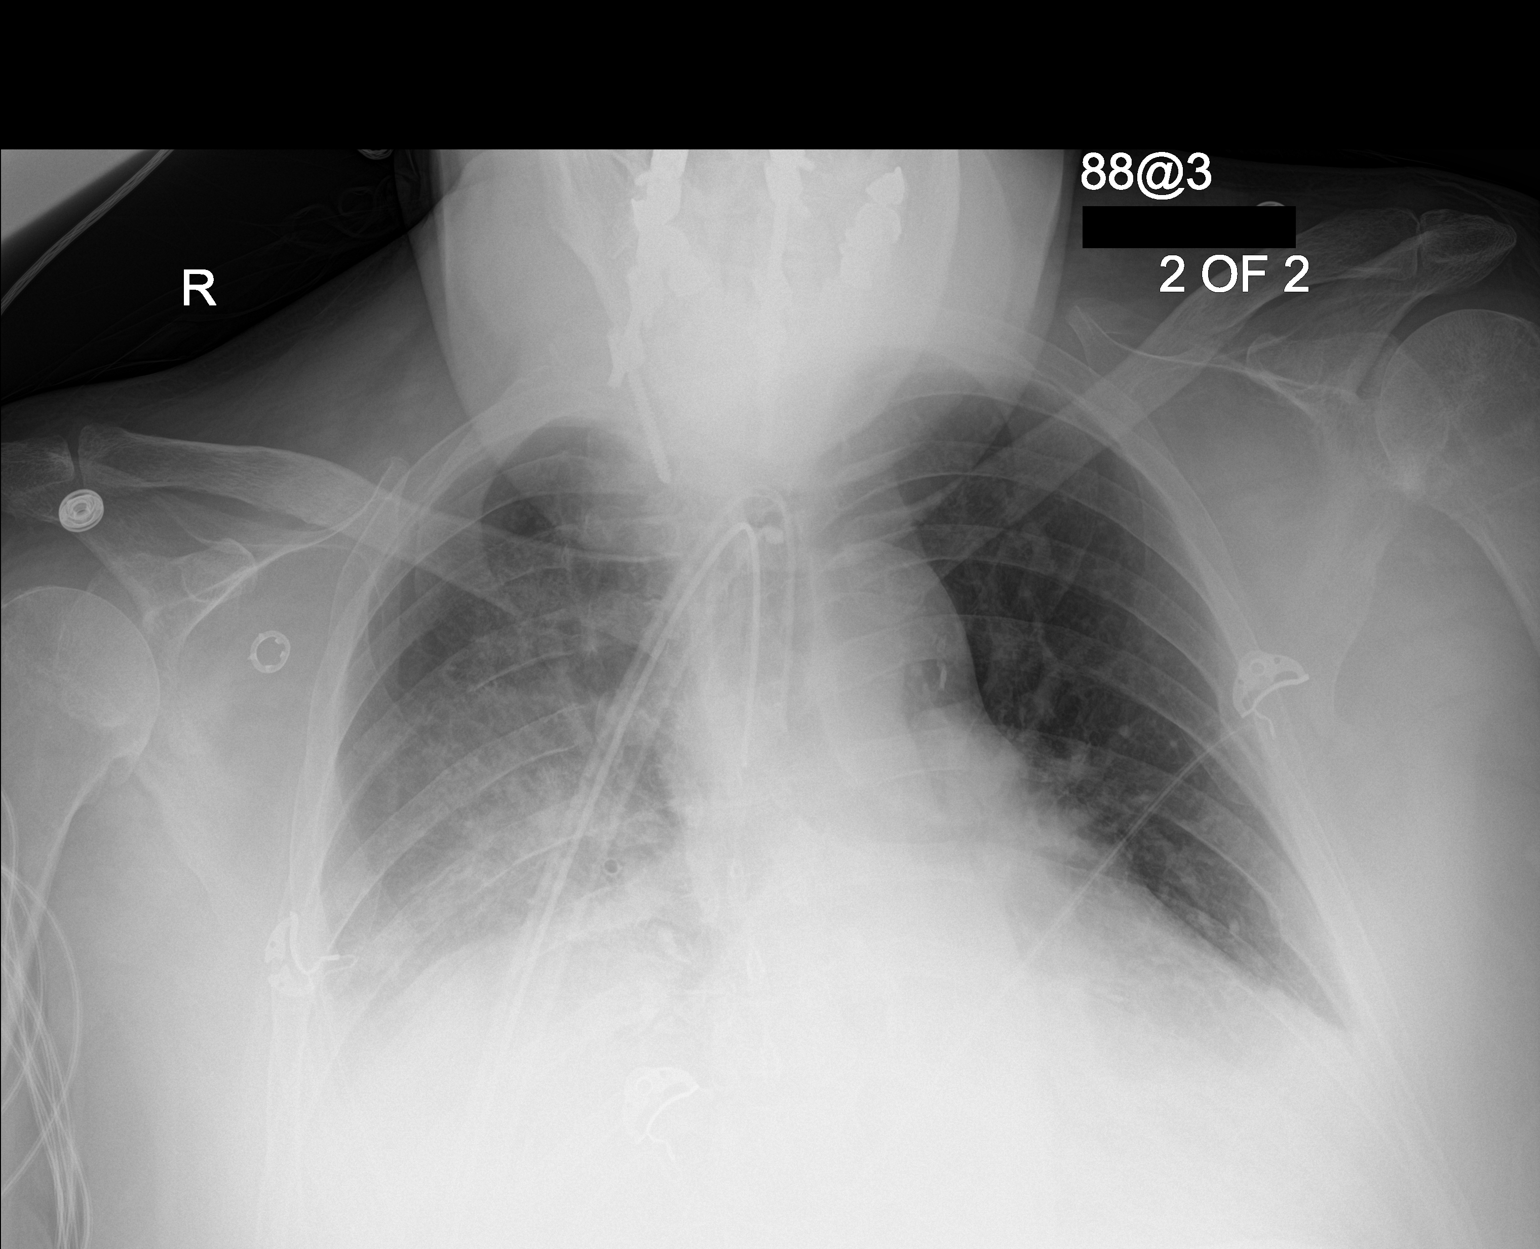

[2 of 2 positions shown; findings below may reference images not displayed]

FINDINGS: A tracheostomy catheter is present with tip 2 cm above the carina.

Increasing pulmonary vascular congestion/ edema on the right noted.

There is no evidence of pneumothorax.

Right lower lung consolidation/atelectasis again identified.
IMPRESSION: Tracheostomy catheter identified with tip 2 cm above the carina.

Increasing right pulmonary vascular congestion/ edema.

Continued right lower lung opacity/ consolidation/atelectasis.

## 2017-11-05 IMAGING — DX DG CHEST 1V PORT
1 series · 1 of 1 positions shown · non-contrast
Comparison: 03/05/2016

CLINICAL DATA: Evaluate tracheostomy catheter.

EXAM:
PORTABLE CHEST 1 VIEW

[chest ap]
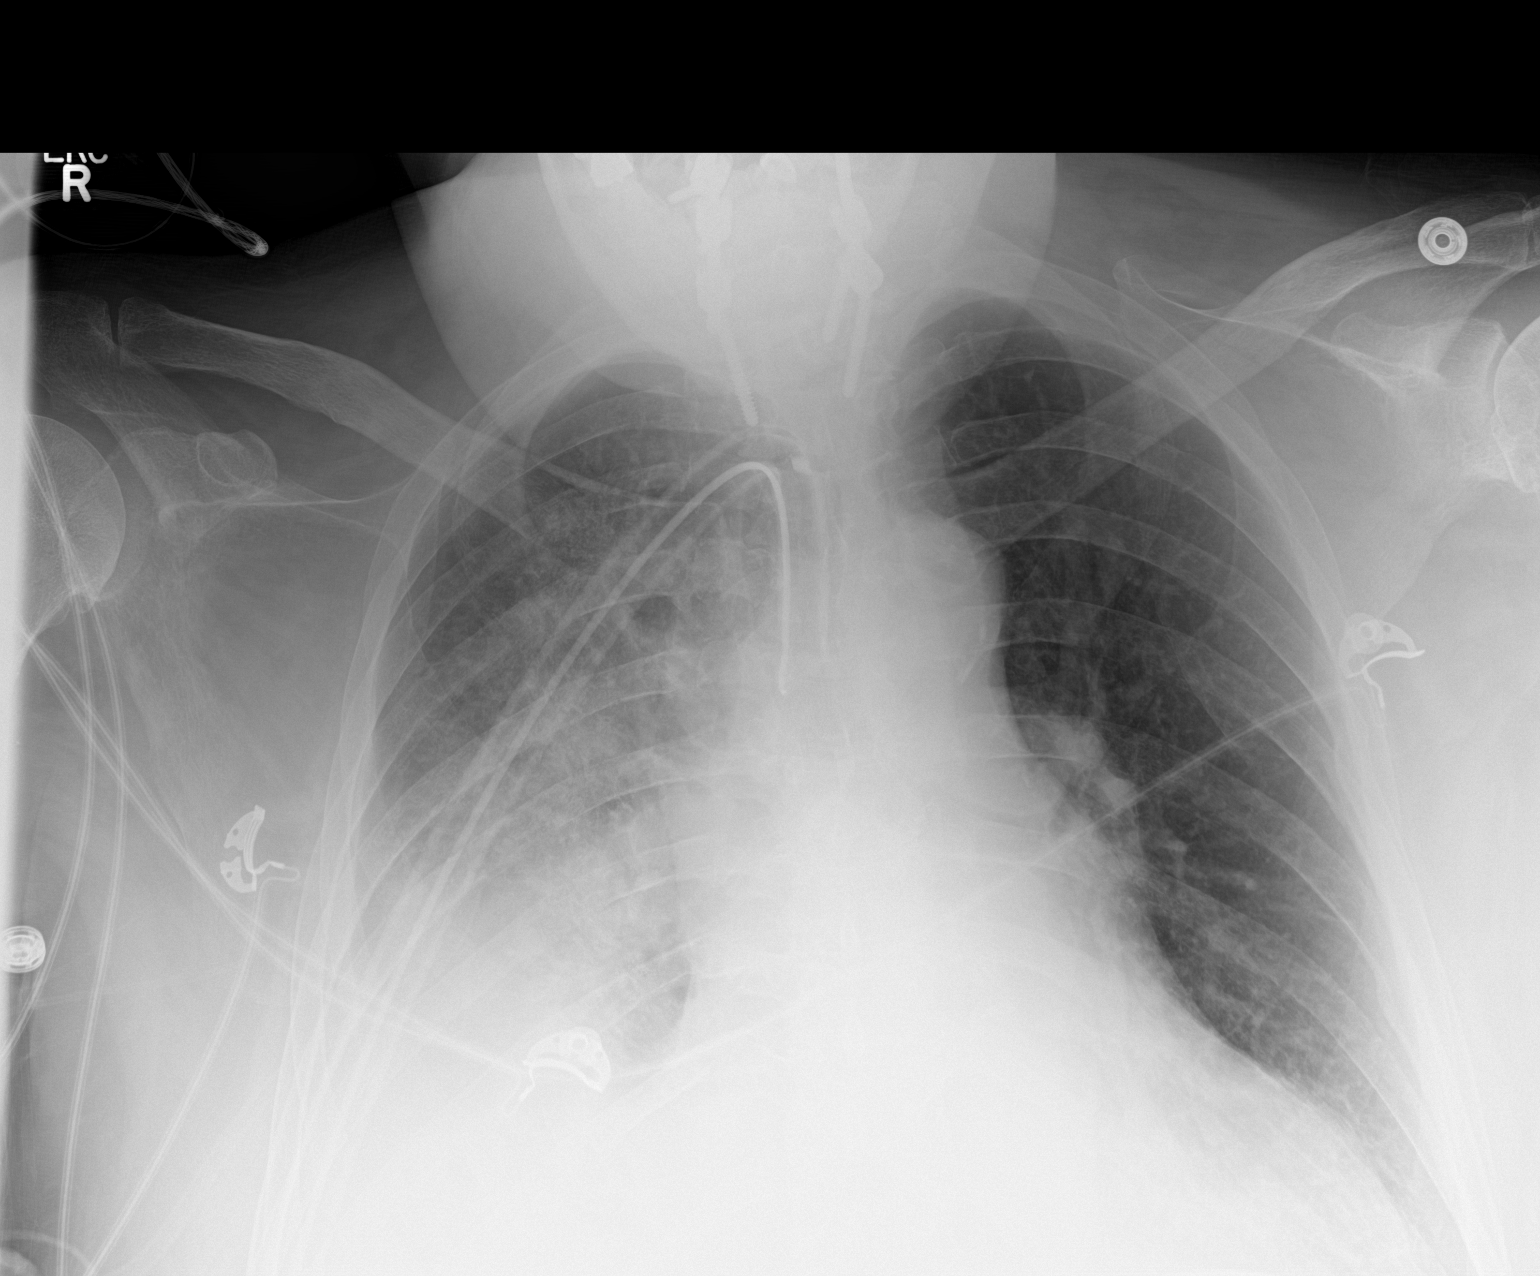

[1 of 1 positions shown; findings below may reference images not displayed]

FINDINGS: Tracheostomy catheter appears unchanged with tip 2.5 cm above
carina.

Right lung airspace disease/edema and right lower lung
consolidation/atelectasis again noted.

There is no evidence of pneumothorax.

There has been little interval change since the prior study.
IMPRESSION: Little significant change as described.

## 2017-11-06 IMAGING — DX DG CHEST 1V PORT
2 series · 2 of 2 positions shown · non-contrast
Comparison: 03/06/2016 and prior exam

CLINICAL DATA: Acute respiratory failure.

EXAM:
PORTABLE CHEST 1 VIEW

[chest ap (1 of 2)]
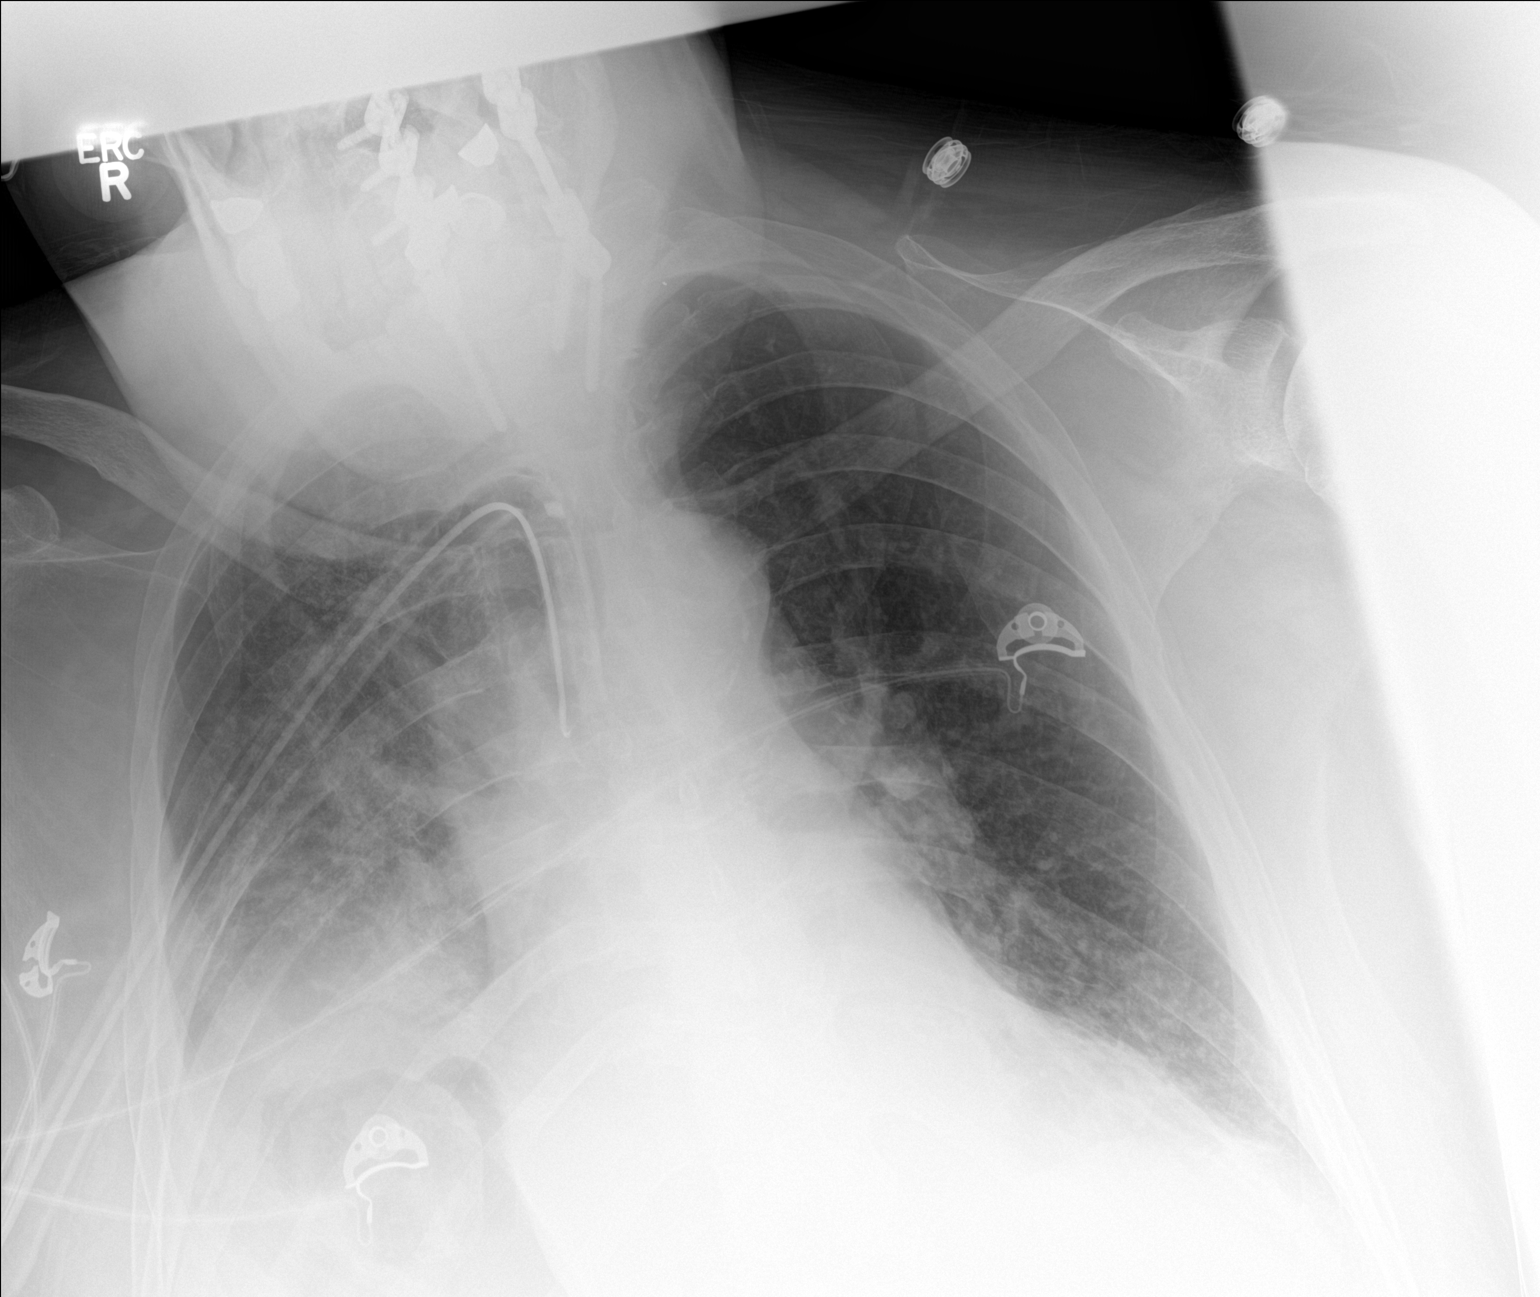

[chest ap (2 of 2)]
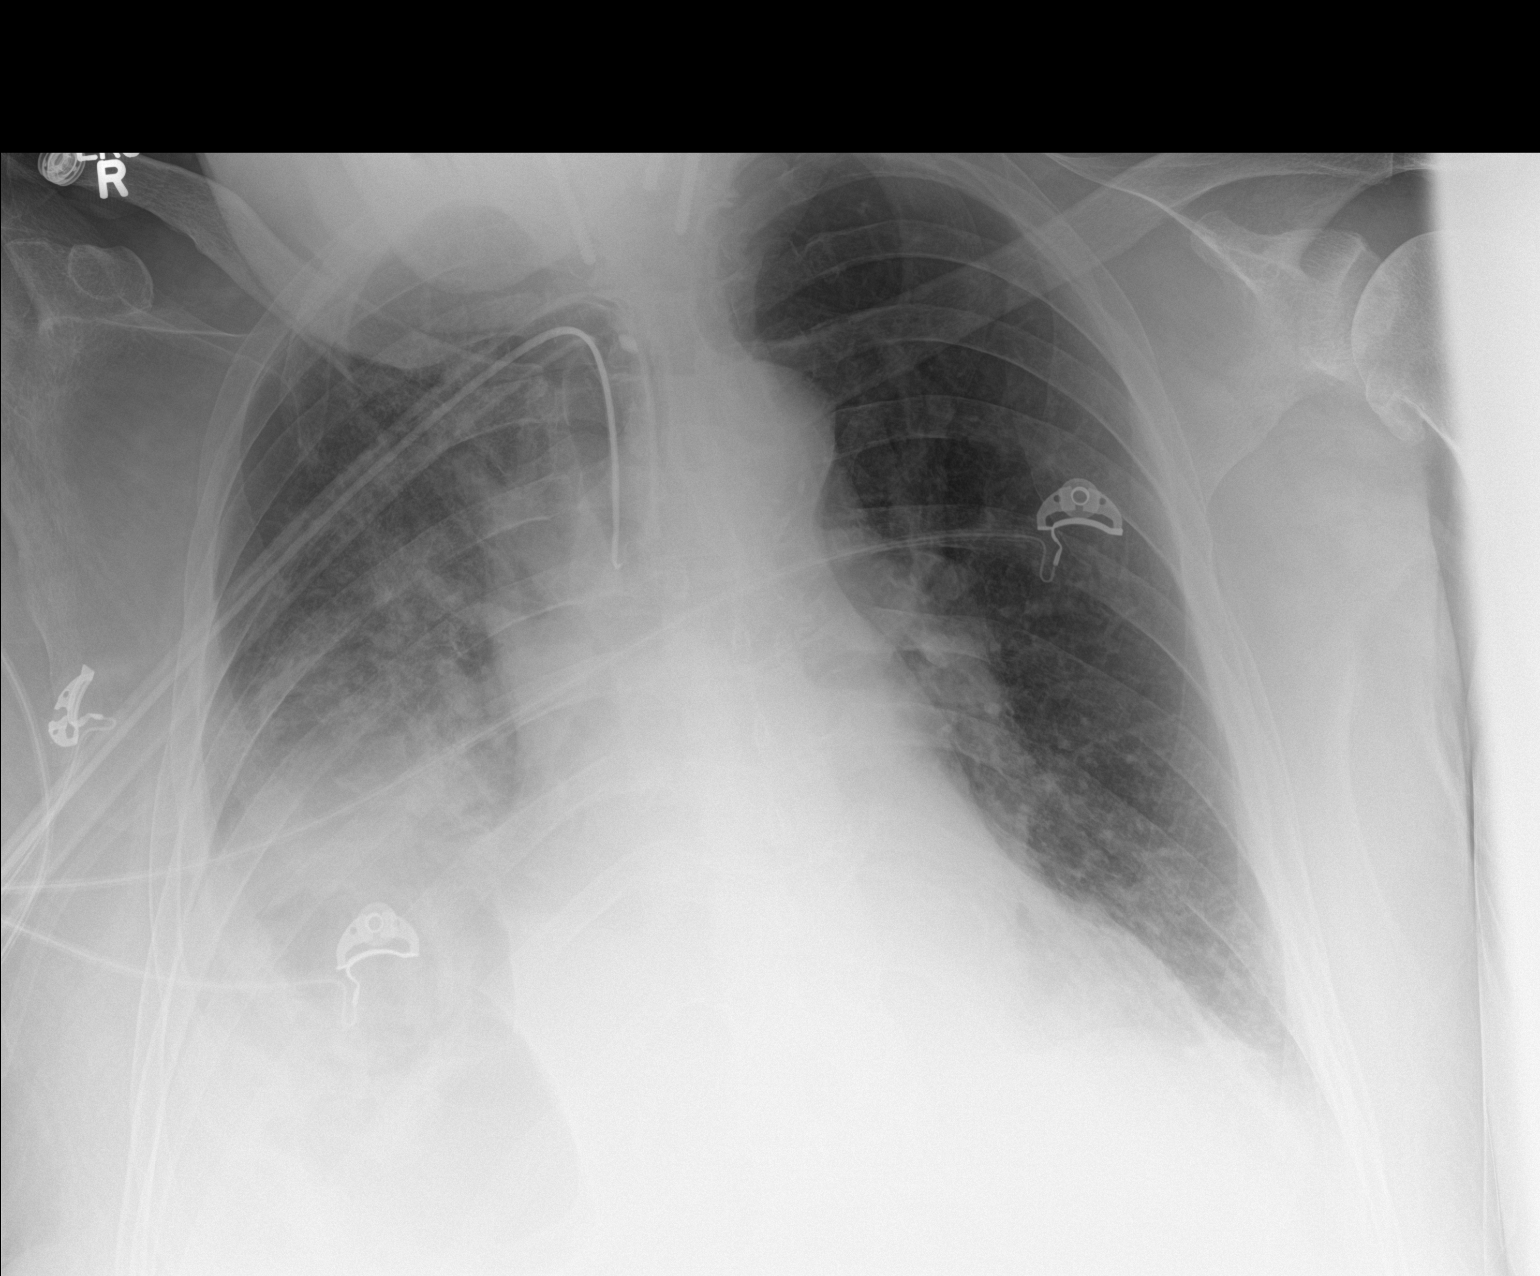

[2 of 2 positions shown; findings below may reference images not displayed]

FINDINGS: The tracheostomy tube is unchanged.

Right lung airspace disease and bilateral lower lung
atelectasis/airspace disease again noted.

There is no evidence of pneumothorax.

There has been little interval change since prior study.
IMPRESSION: No significant change.

## 2017-11-06 IMAGING — DX DG CHEST 1V PORT
1 series · 1 of 1 positions shown · non-contrast
Comparison: 03/07/2016

CLINICAL DATA: PICC line placement

EXAM:
PORTABLE CHEST 1 VIEW

[chest ap]
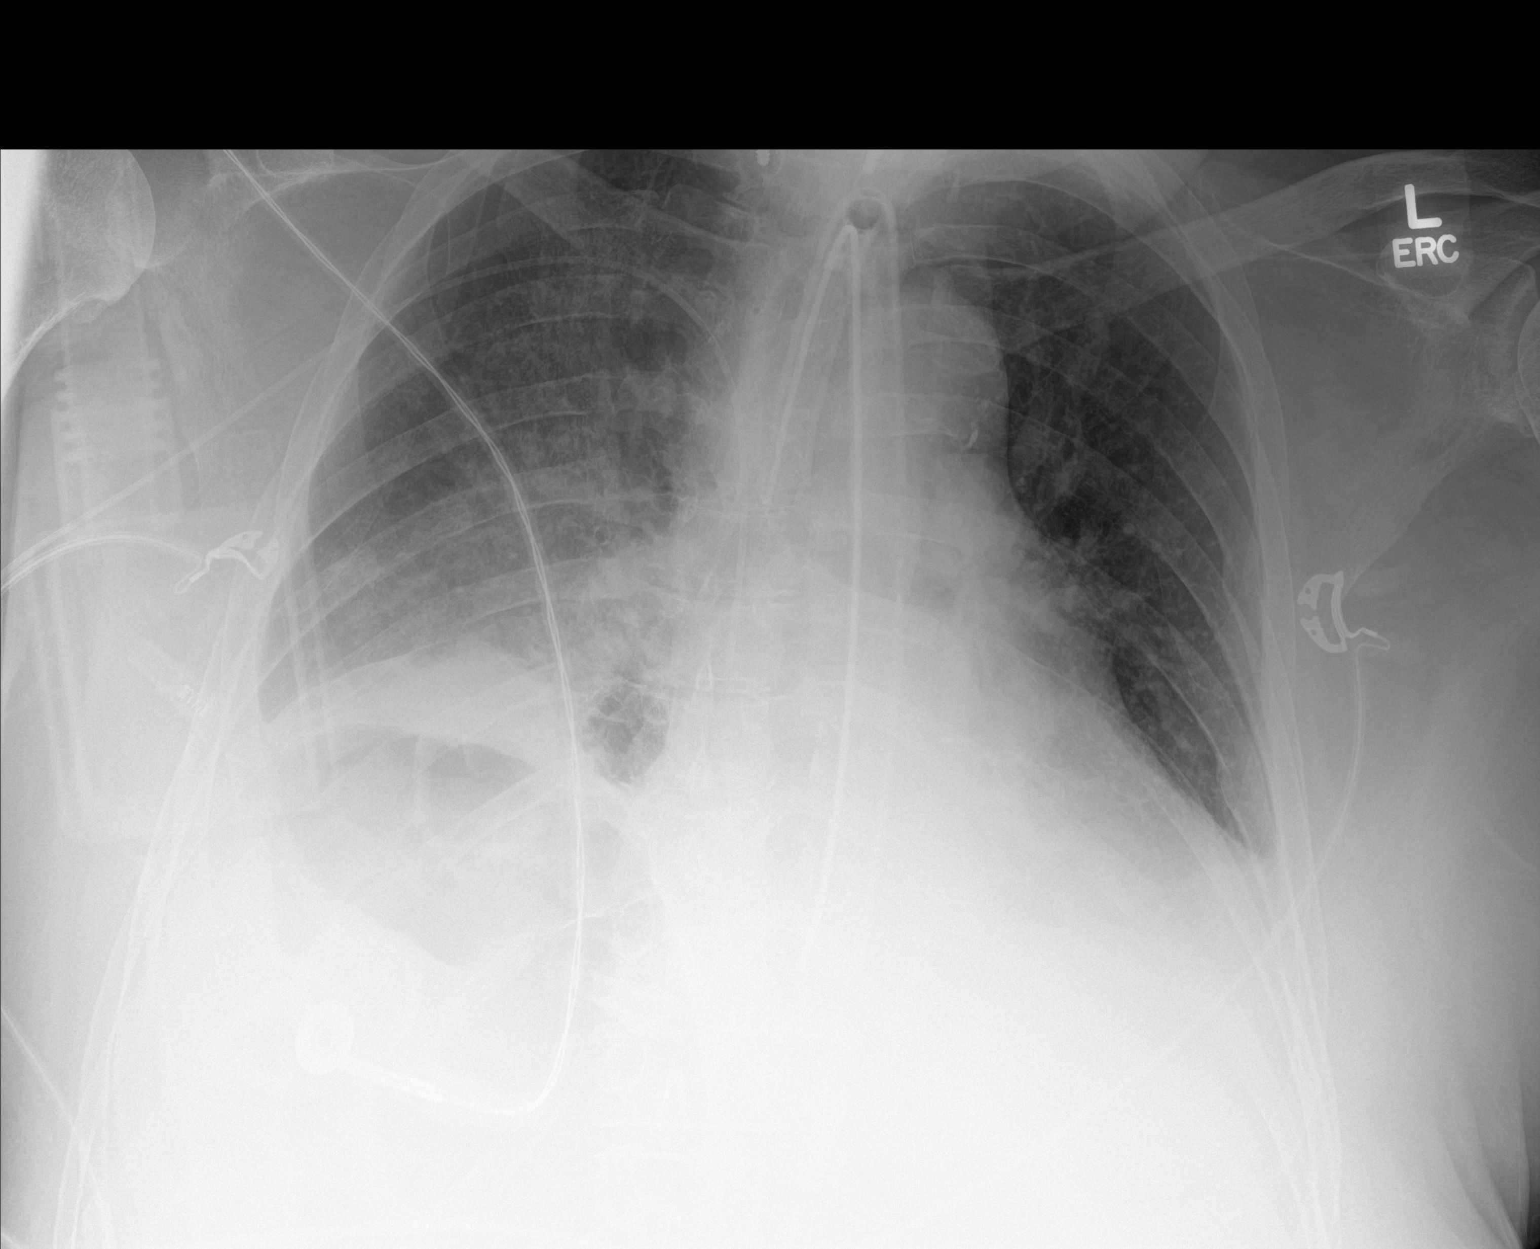

[1 of 1 positions shown; findings below may reference images not displayed]

FINDINGS: A right PICC catheter is been placed with tip over the cavoatrial
junction region. No pneumothorax. Tracheostomy tube with tip
measuring 1.9 cm above the carina. Postoperative changes in the
cervical spine. Mild cardiac enlargement without vascular
congestion. Shallow inspiration. Small bilateral pleural effusions
with basilar atelectasis. Right perihilar infiltration could
represent pneumonia or asymmetric edema. Old left rib fractures.
IMPRESSION: Right PICC catheter with tip over the cavoatrial junction region. No
pneumothorax. Persistent bilateral pneumothoraces, bibasilar
atelectasis, and infiltration or edema in the right perihilar
region.

## 2017-11-07 IMAGING — DX DG ABDOMEN 1V
1 series · 1 of 1 positions shown · non-contrast
Comparison: 02/18/2016

CLINICAL DATA: Soft feeding tube placement.

EXAM:
ABDOMEN - 1 VIEW

[abdomen kub]
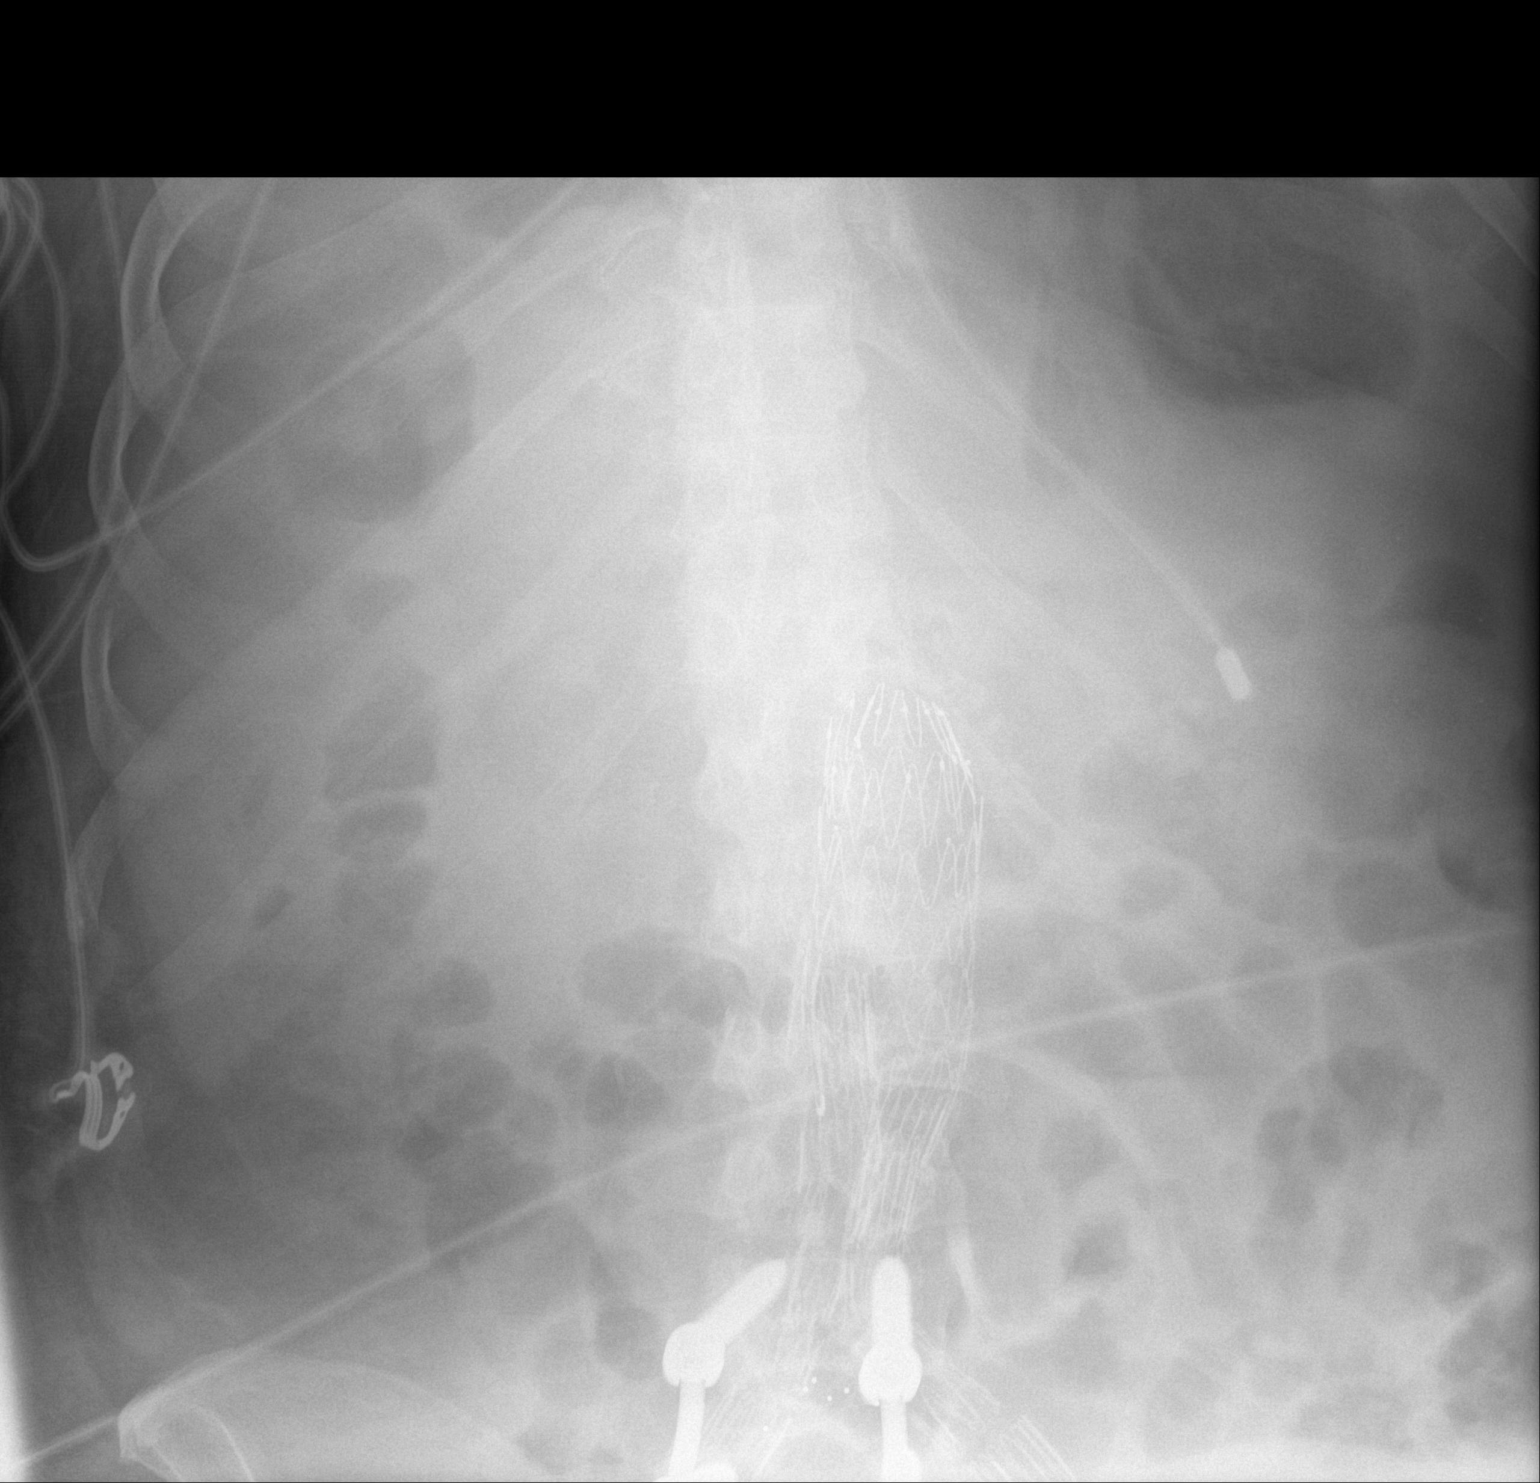

[1 of 1 positions shown; findings below may reference images not displayed]

FINDINGS: Soft feeding tube has its tip in the midportion of the stomach. Gas
pattern unremarkable. Right hemidiaphragm elevated.
IMPRESSION: Soft feeding tube in the gastric body.

## 2017-11-08 IMAGING — CR DG ABD PORTABLE 1V
1 series · 1 of 1 positions shown · non-contrast
Comparison: Abdominal radiograph March 08, 2016

CLINICAL DATA: Nasogastric tube placement.

EXAM:
PORTABLE ABDOMEN - 1 VIEW

[AP]
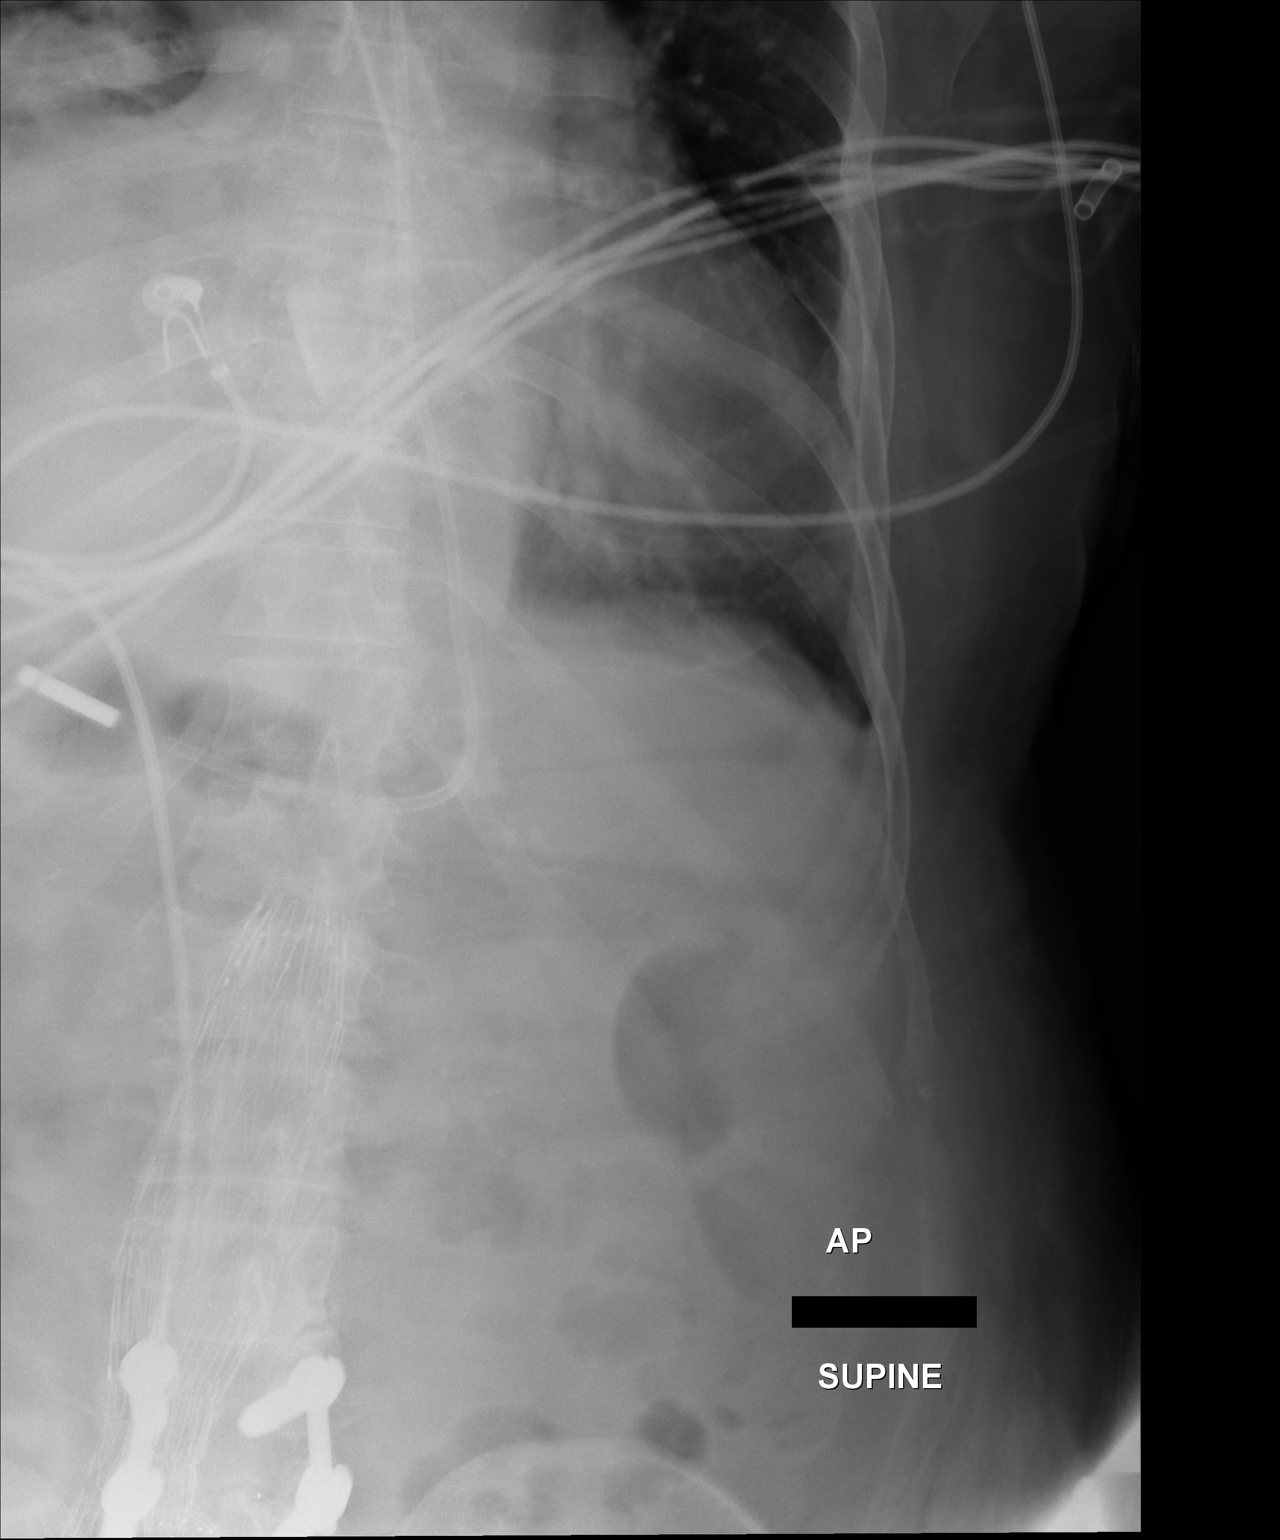

[1 of 1 positions shown; findings below may reference images not displayed]

FINDINGS: Feeding tube tip projects in distal stomach. Included bowel gas
pattern is nondilated and nonobstructive. Aortoiliac endovascular
stent. Punctate possible pancreatic calcifications versus vascular
calcifications. Lower lumbar PLIF. Consolidation RIGHT lung base as
seen on today's chest radiograph.
IMPRESSION: Feeding tube tip projects in distal stomach.

## 2017-11-08 IMAGING — DX DG CHEST 1V PORT
1 series · 1 of 1 positions shown · non-contrast
Comparison: 03/07/2016

CLINICAL DATA: Respiratory failure

EXAM:
PORTABLE CHEST 1 VIEW

[chest ap]
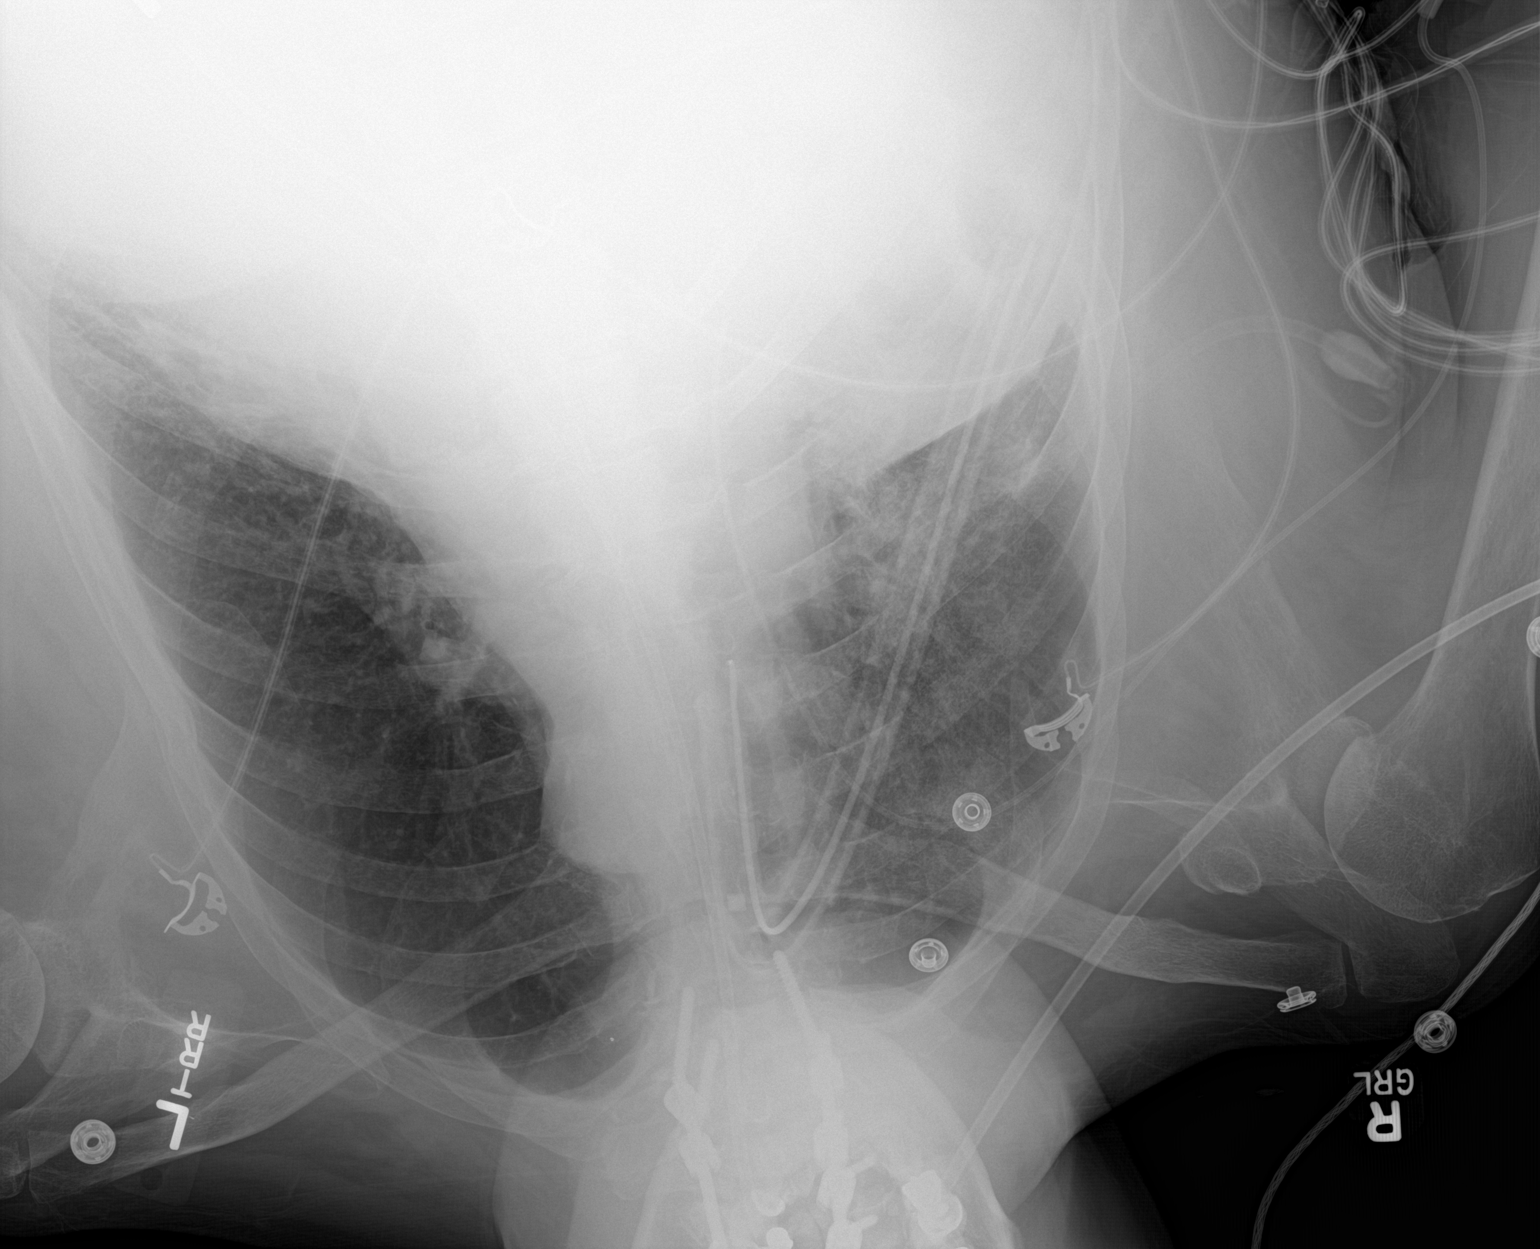

[1 of 1 positions shown; findings below may reference images not displayed]

FINDINGS: Tracheostomy tube tip 2 cm above the carina unchanged. Right lower
lobe atelectasis has progressed. Left lower lobe atelectasis
unchanged.

Right perihilar infiltrate unchanged, possible pneumonia.

Small bilateral pleural effusions unchanged. Right arm PICC tip in
the right atrium. Feeding tube in place with the tip not visualized.
IMPRESSION: Right perihilar infiltrate unchanged, possible pneumonia.

Mild progression of right lower lobe atelectasis. Left lower lobe
atelectasis unchanged. Support lines unchanged in position.

## 2017-11-13 IMAGING — CT CT HEAD W/O CM
4 series · 15 of 47 positions shown, 17 images · non-contrast
Comparison: Head CT dated 03/03/2016

CLINICAL DATA: 70-year-old male with fall and trauma to the left
side of the forehead.

EXAM:
CT HEAD WITHOUT CONTRAST
TECHNIQUE: Contiguous axial images were obtained from the base of the skull
through the vertex without intravenous contrast.

[Series 2: head without · axial · non-contrast · 0.43mm/px · z∈[-103,+17]mm · 7 of 33 slices shown, 9 images]
[im 5/33  brain]
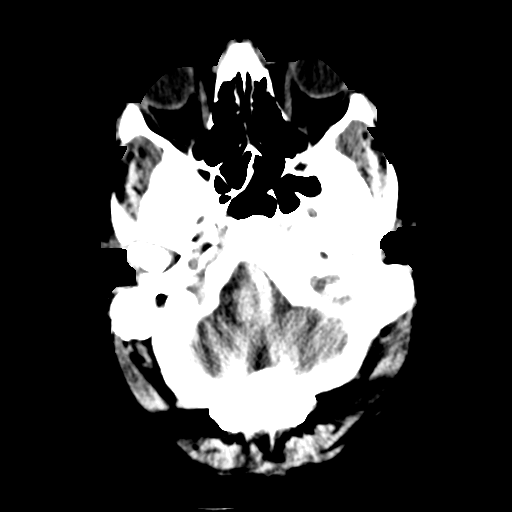
[im 5/33  bone]
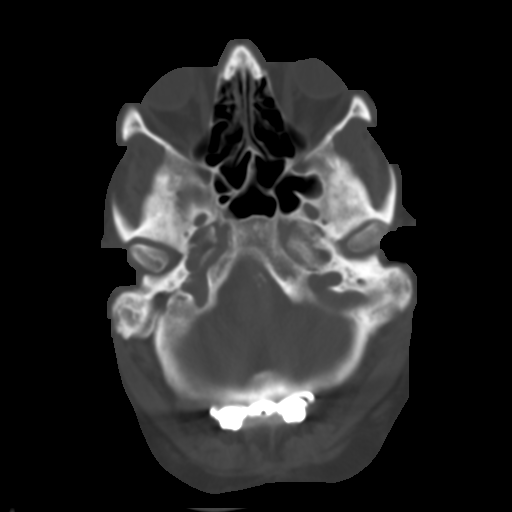
[im 9/33  brain]
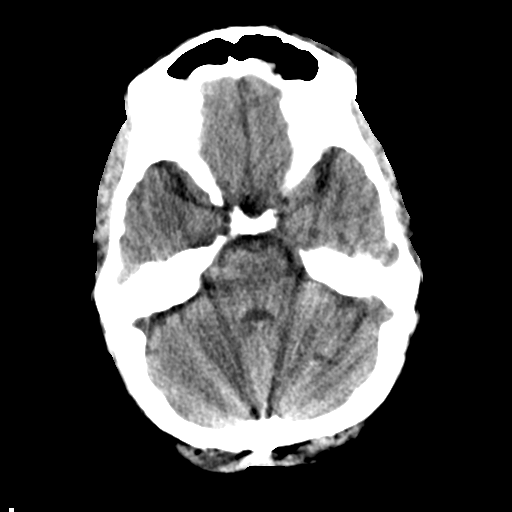
[im 13/33  brain]
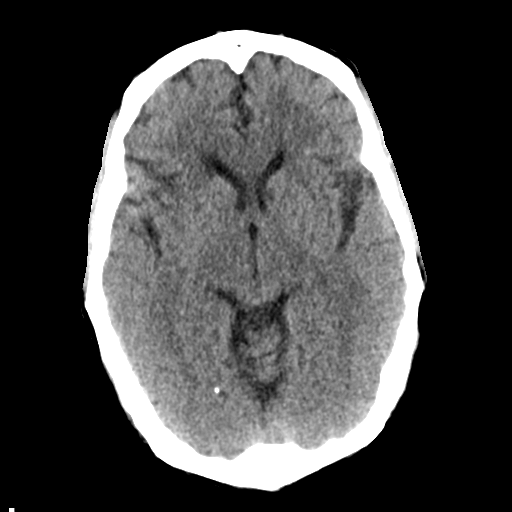
[im 17/33  brain]
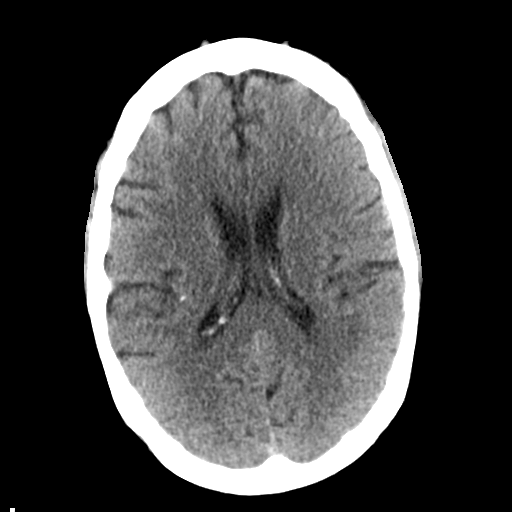
[im 21/33  brain]
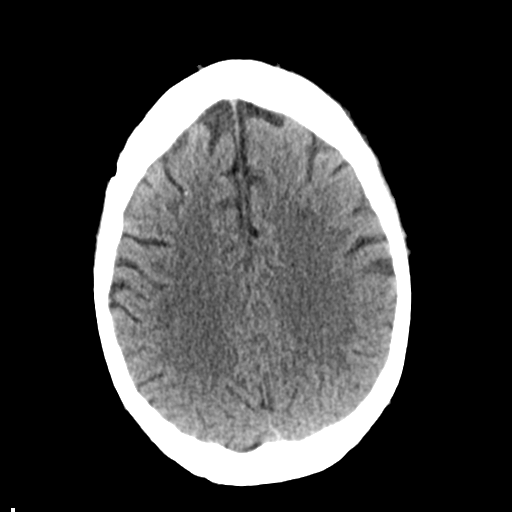
[im 21/33  bone]
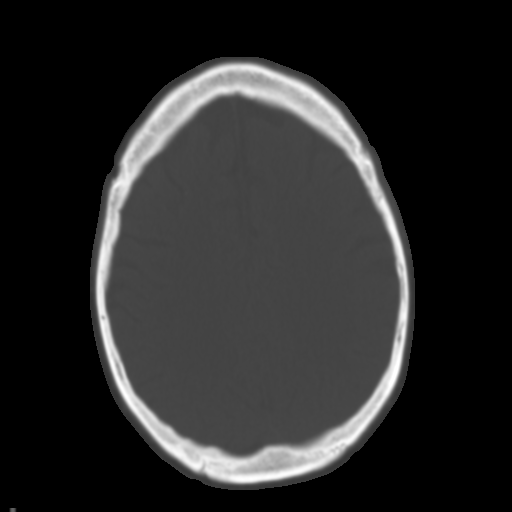
[im 25/33  brain]
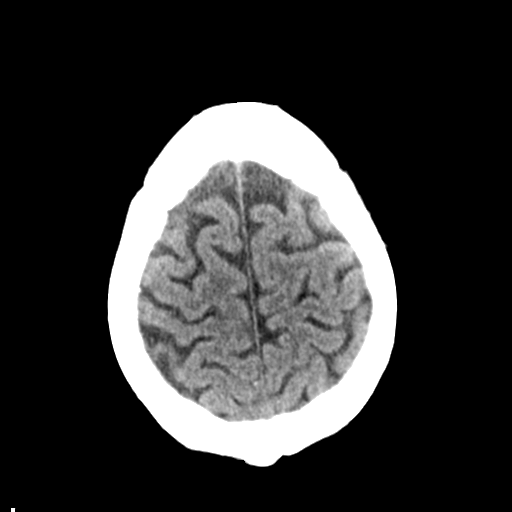
[im 29/33  brain]
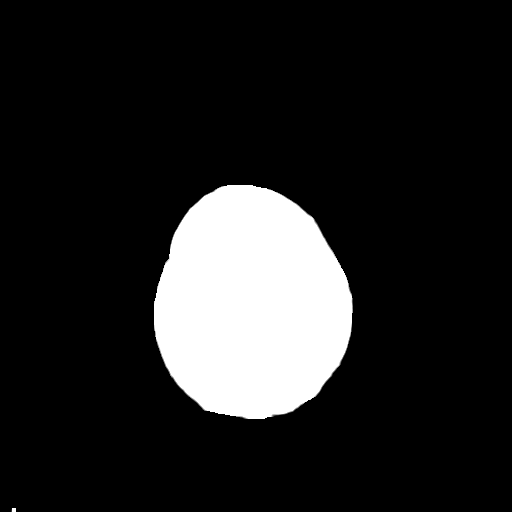

[Series 3: head bone · axial · 0.43mm/px · z∈[-107,-91]mm · 2 of 81 slices shown]
[im 9/81  bone]
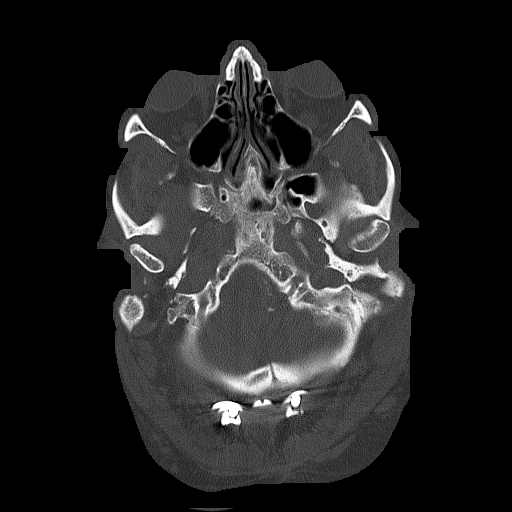
[im 17/81  bone]
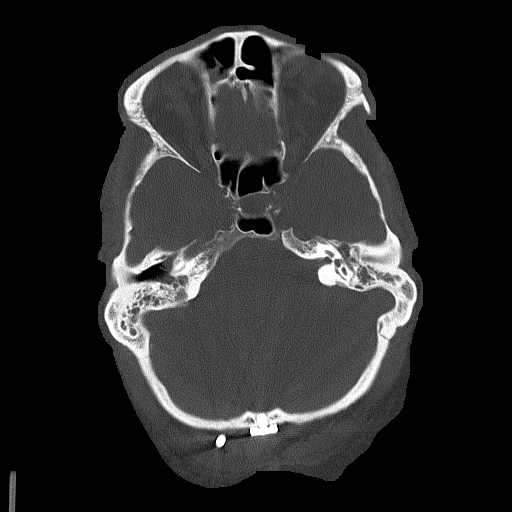

[Series 4: head without cor · coronal · non-contrast · 0.34mm/px · 3 of 73 slices shown]
[im 25/73  brain]
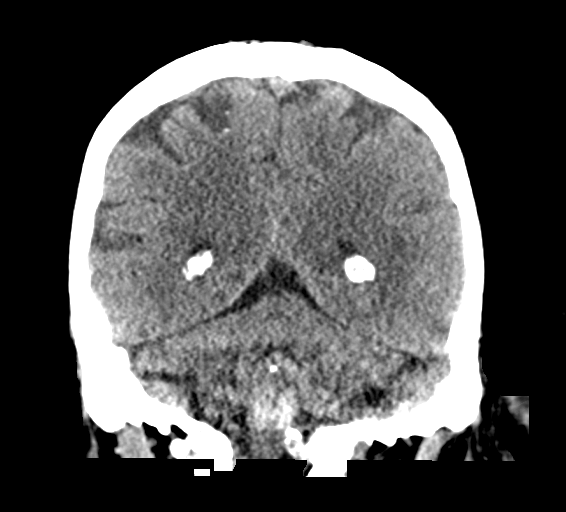
[im 33/73  brain]
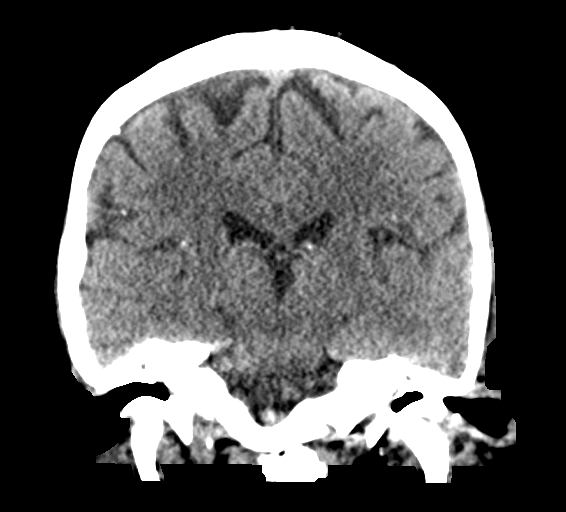
[im 41/73  brain]
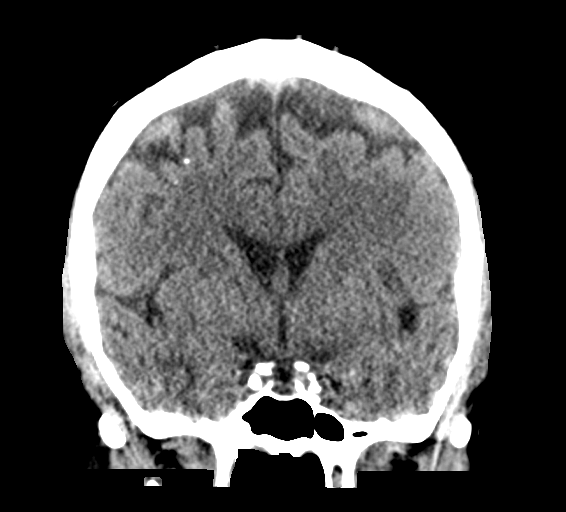

[Series 5: head without sag · sagittal · non-contrast · 0.34mm/px · 3 of 56 slices shown]
[im 19/56  brain]
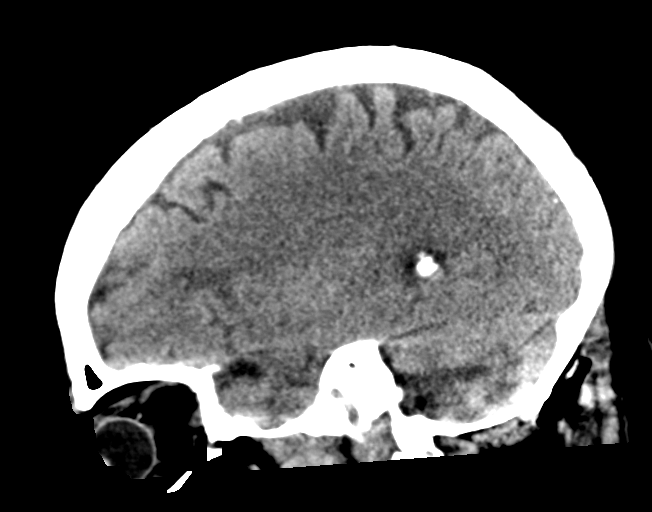
[im 28/56  brain]
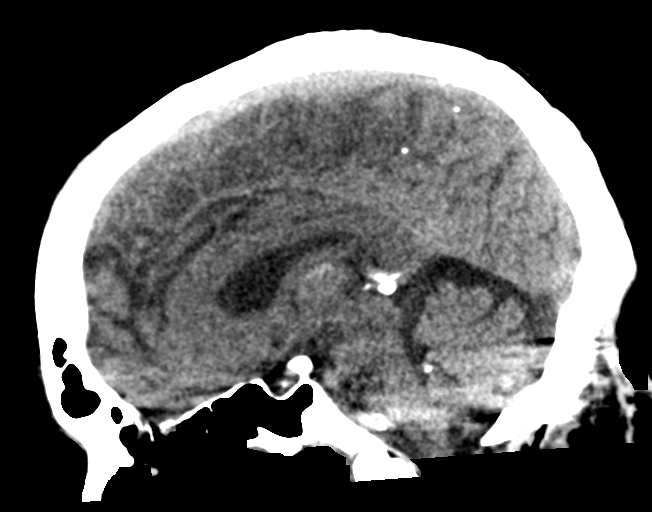
[im 37/56  brain]
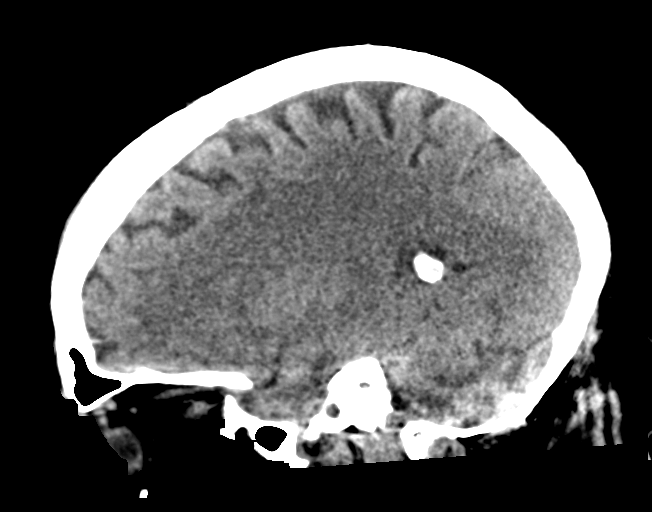

[15 of 47 positions shown; findings below may reference images not displayed]

FINDINGS: Evaluation is limited due to streak artifact caused by metallic
hardware in the suboccipital region.

Brain: The ventricles and sulci are appropriate in size for
patient's age. Mild periventricular and deep white matter chronic
microvascular ischemic changes noted. There is an ill-defined 2.2 x
1.3 cm focal area of hypodensity in the right frontal
periventricular white matter which appears more conspicuous compared
to the prior study and may be related to chronic ischemic changes,
however; an acute or subacute ischemia is not excluded. MRI is
recommended if there is clinical concern for acute infarct. There is
no acute intracranial hemorrhage. No mass effect or midline shift
noted. There are small scattered cortical calcifications as seen on
the prior study likely sequela of chronic insult.

Vascular: No hyperdense vessel or unexpected calcification.

Skull: Suboccipital and upper cervical orthopedic hardware. No acute
calvarial pathology.

Sinuses/Orbits: There is mild mucoperiosteal thickening of the
ethmoid air cells and frontal sinuses. No air-fluid levels. There is
diffuse opacification of the mastoid air cells bilaterally.

Other: Small left forehead scalp laceration.
IMPRESSION: No acute intracranial hemorrhage.

Mild age-related atrophy and chronic microvascular ischemic changes.
Ill-defined focal hypodensity in the right frontal periventricular
white matter appears more conspicuous compared to the prior study.
Further evaluation with MRI is recommended if there is clinical
concern for acute infarct.

## 2017-11-13 IMAGING — CR DG ABD PORTABLE 1V
2 series · 2 of 2 positions shown · non-contrast
Comparison: Radiograph dated 03/09/16

CLINICAL DATA: 70-year-old male status post fall and pole on
enteric tube.

EXAM:
PORTABLE ABDOMEN - 1 VIEW

[AP (1 of 2)]
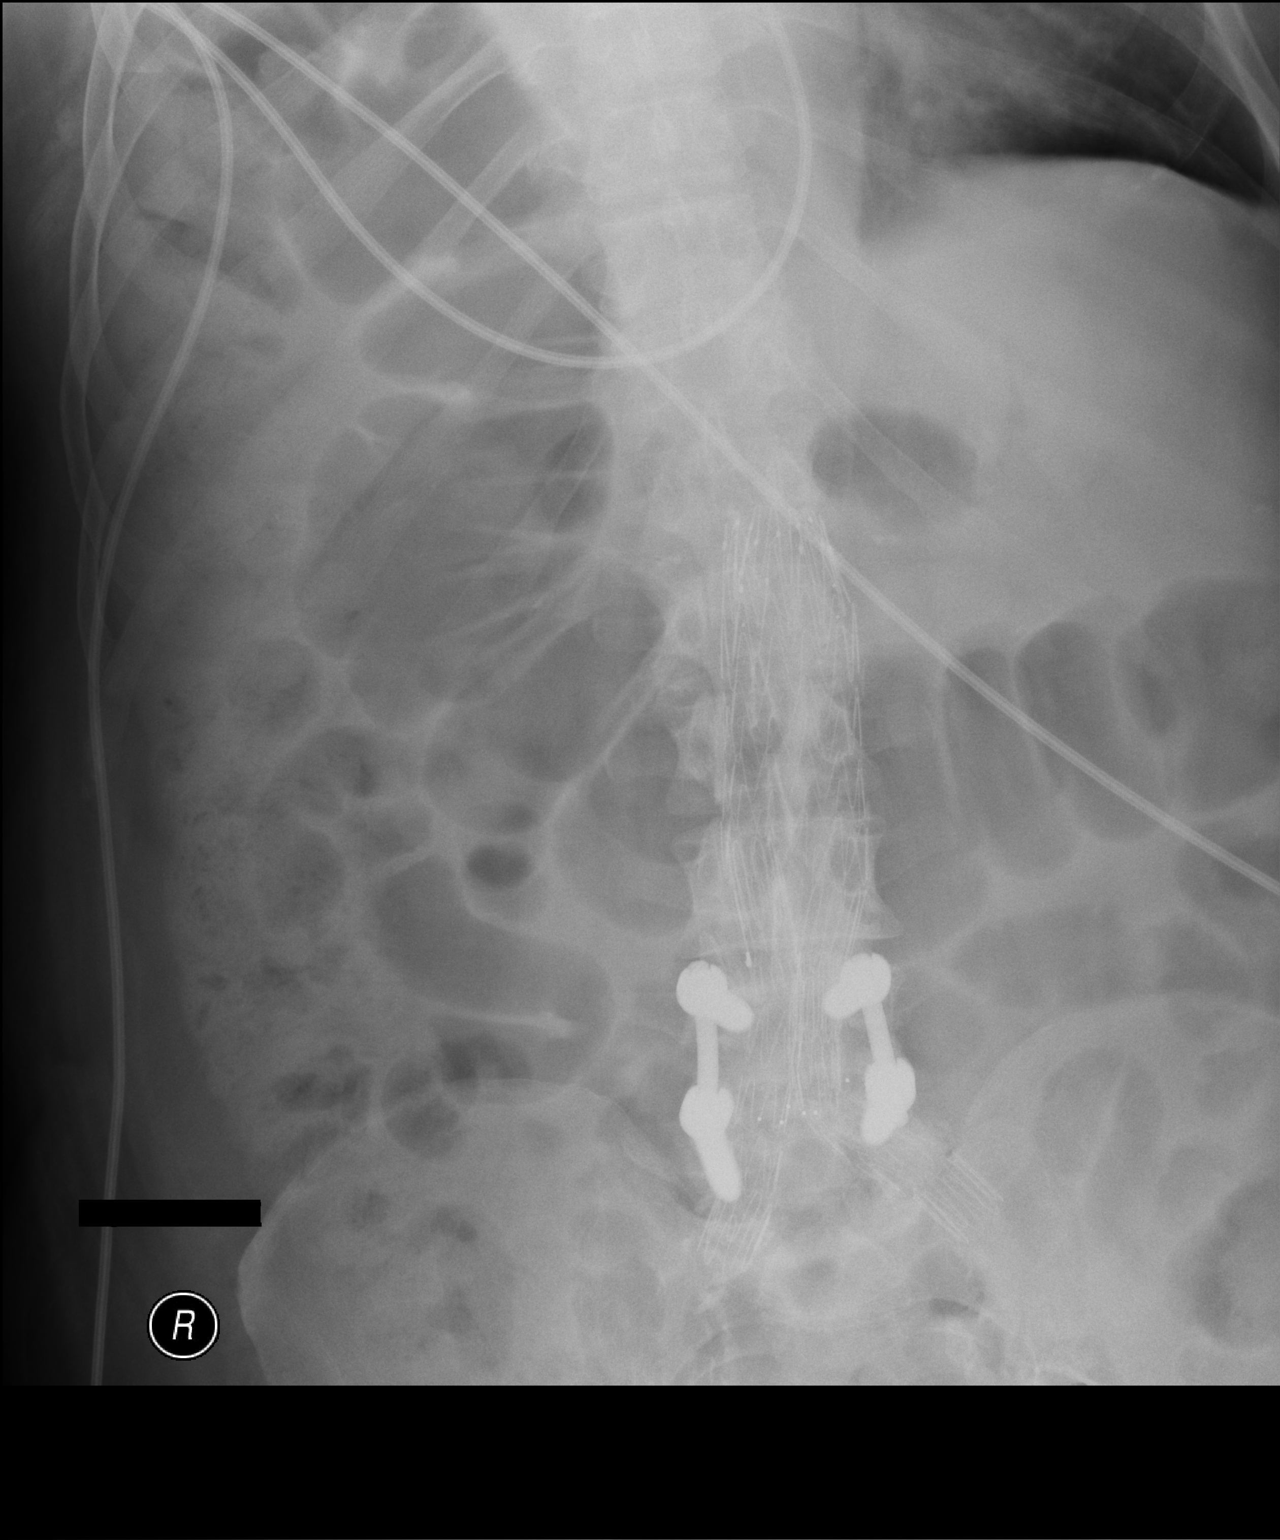

[AP (2 of 2)]
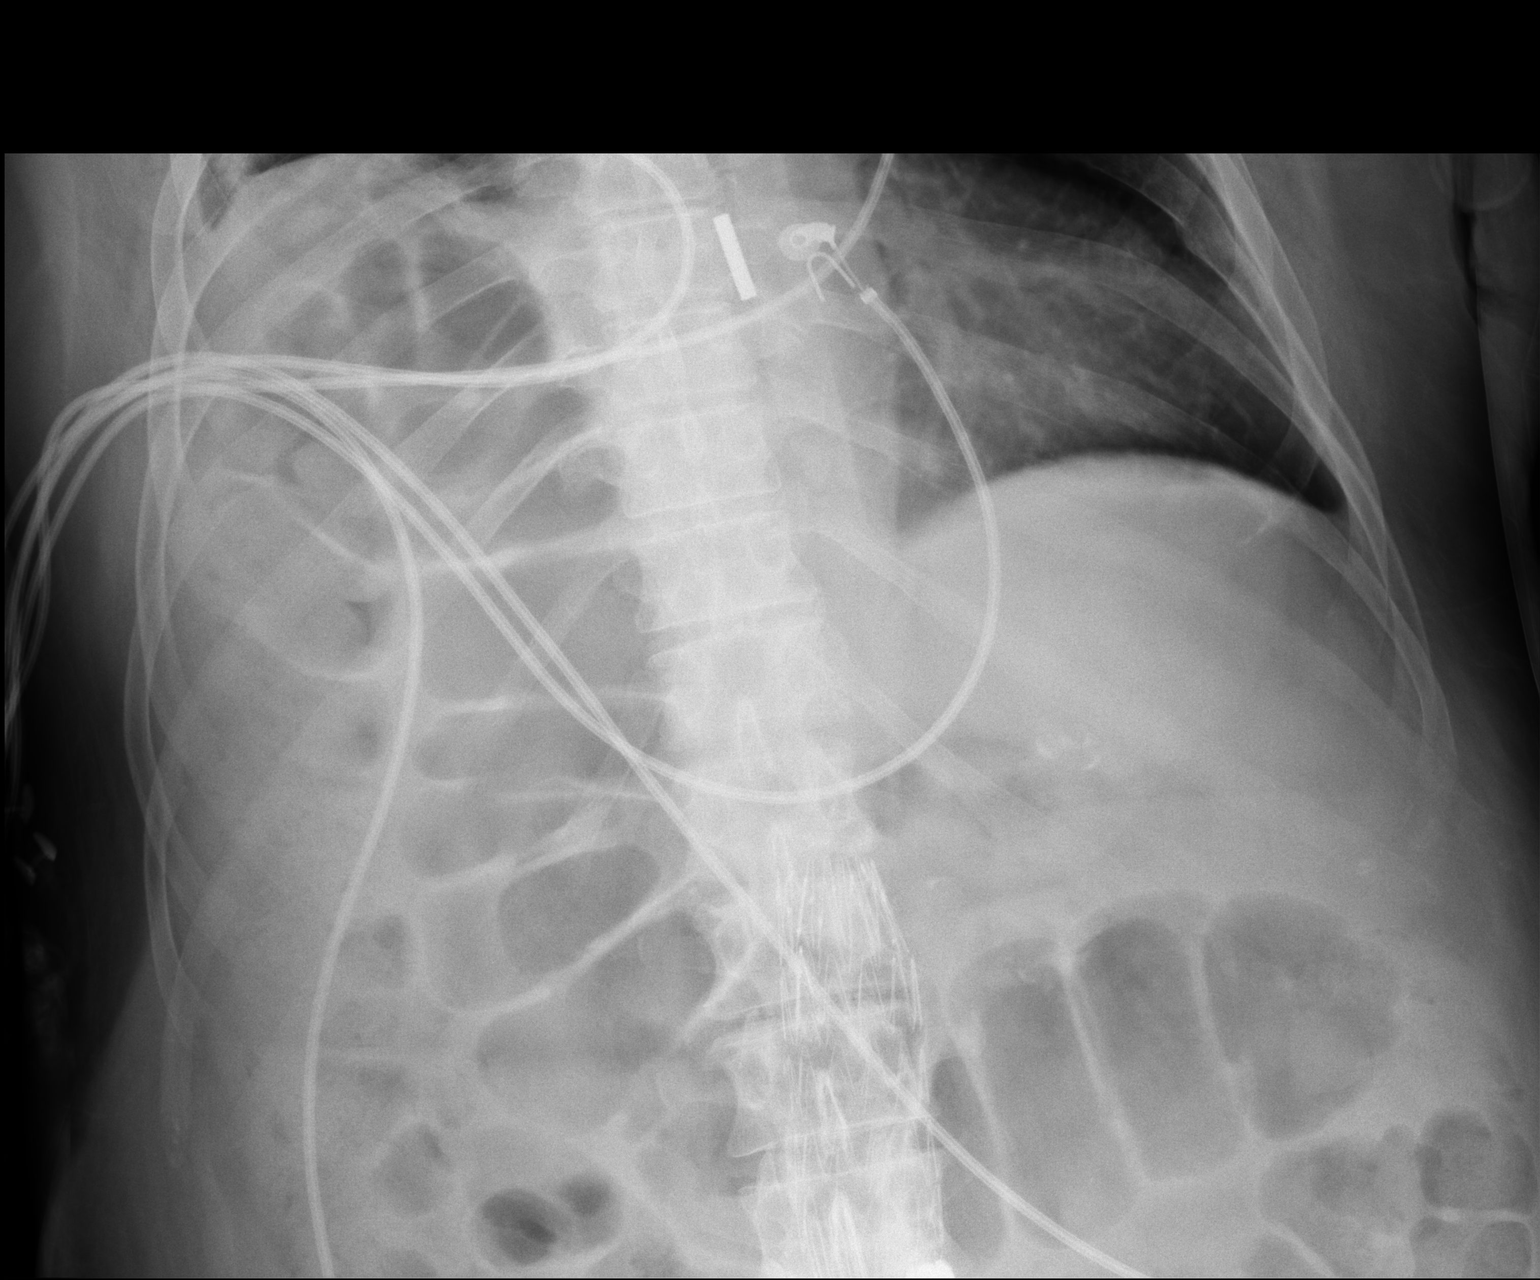

[2 of 2 positions shown; findings below may reference images not displayed]

FINDINGS: There has been interval retraction of the enteric tube with tip now
overlying the distal esophagus. Recommend advancing the tube into
the stomach. There is eventration of the right hemidiaphragm. Air is
noted throughout the colon. No bowel dilatation or evidence of
obstruction. An aortobifemoral endovascular stent graft noted.
Atrophy there degenerative changes of the spine with L4-L5 fixation
hardware. No acute fracture. Partially visualized Right lung base
densities may represent atelectasis versus infiltrate.
IMPRESSION: Interval retraction of the enteric tube with tip now overlying the
distal esophagus. Recommend advancement of the tube into the
stomach.

## 2017-11-14 IMAGING — CR DG CHEST 1V PORT
1 series · 1 of 1 positions shown · non-contrast
Comparison: Portable chest x-ray March 09, 2016

CLINICAL DATA: Respiratory failure, pneumonia, history of COPD

EXAM:
PORTABLE CHEST 1 VIEW

[AP]
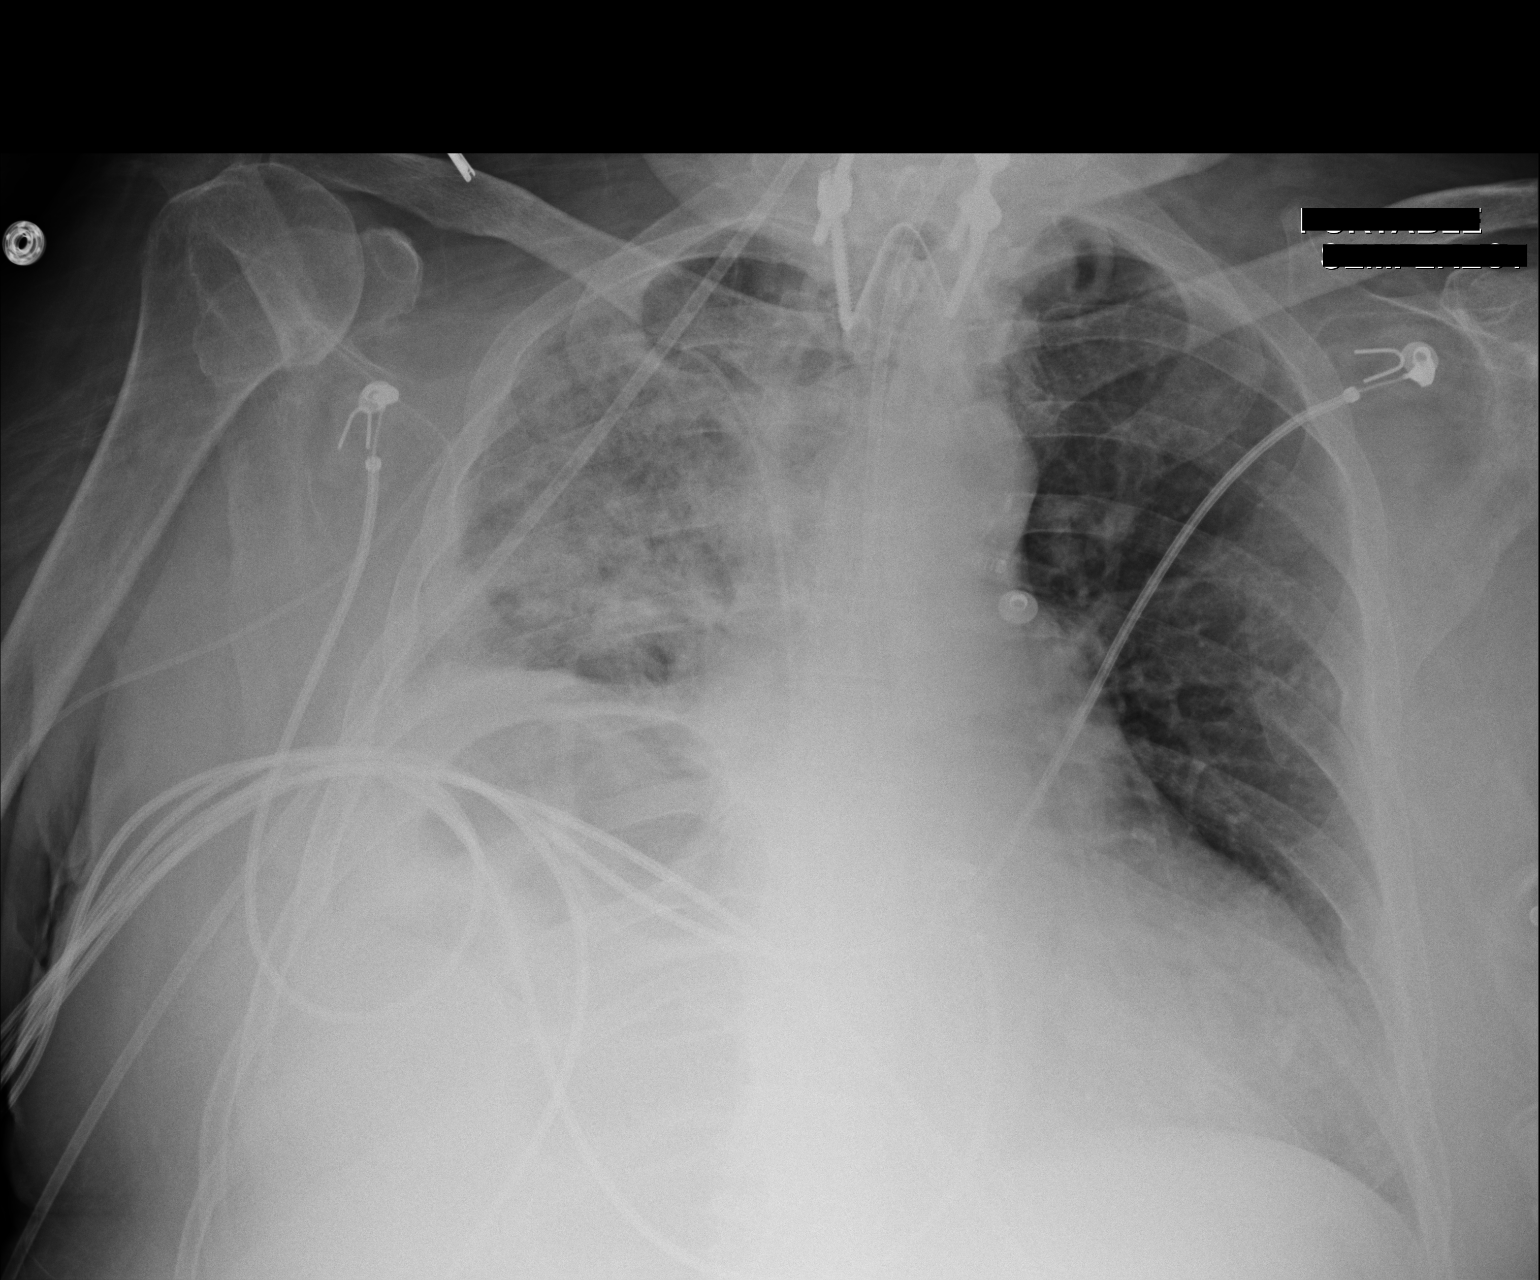

[1 of 1 positions shown; findings below may reference images not displayed]

FINDINGS: A tracheostomy tube is in place with the tip projecting at the level
of the clavicular heads. The left lung is well-expanded. The
interstitial markings are mildly increased on the left. On the right
there is volume loss with elevation of the hemidiaphragm. Confluent
alveolar opacities have developed in the aerated portion of the
right lung. The cardiac silhouette remains enlarged. There is
calcification in the wall of the aortic arch. The pulmonary
vascularity is not clearly engorged. The right-sided PICC line tip
projects over the midportion of the SVC. The feeding tube tip cannot
be clearly discerned but appears to lie below the hemidiaphragm.
IMPRESSION: Increased volume loss and worsening of alveolar opacities on the
right consistent with pneumonia. Mild interstitial prominence on the
left has developed and may reflect early changes of pneumonia or
less likely interstitial edema. No definite pulmonary vascular
congestion. The support tubes are in reasonable position.

## 2017-11-14 IMAGING — DX DG ABD PORTABLE 1V
1 series · 1 of 1 positions shown · non-contrast
Comparison: 03/15/2016

CLINICAL DATA: Post NG tube advancement

EXAM:
PORTABLE ABDOMEN - 1 VIEW

[abdomen supine]
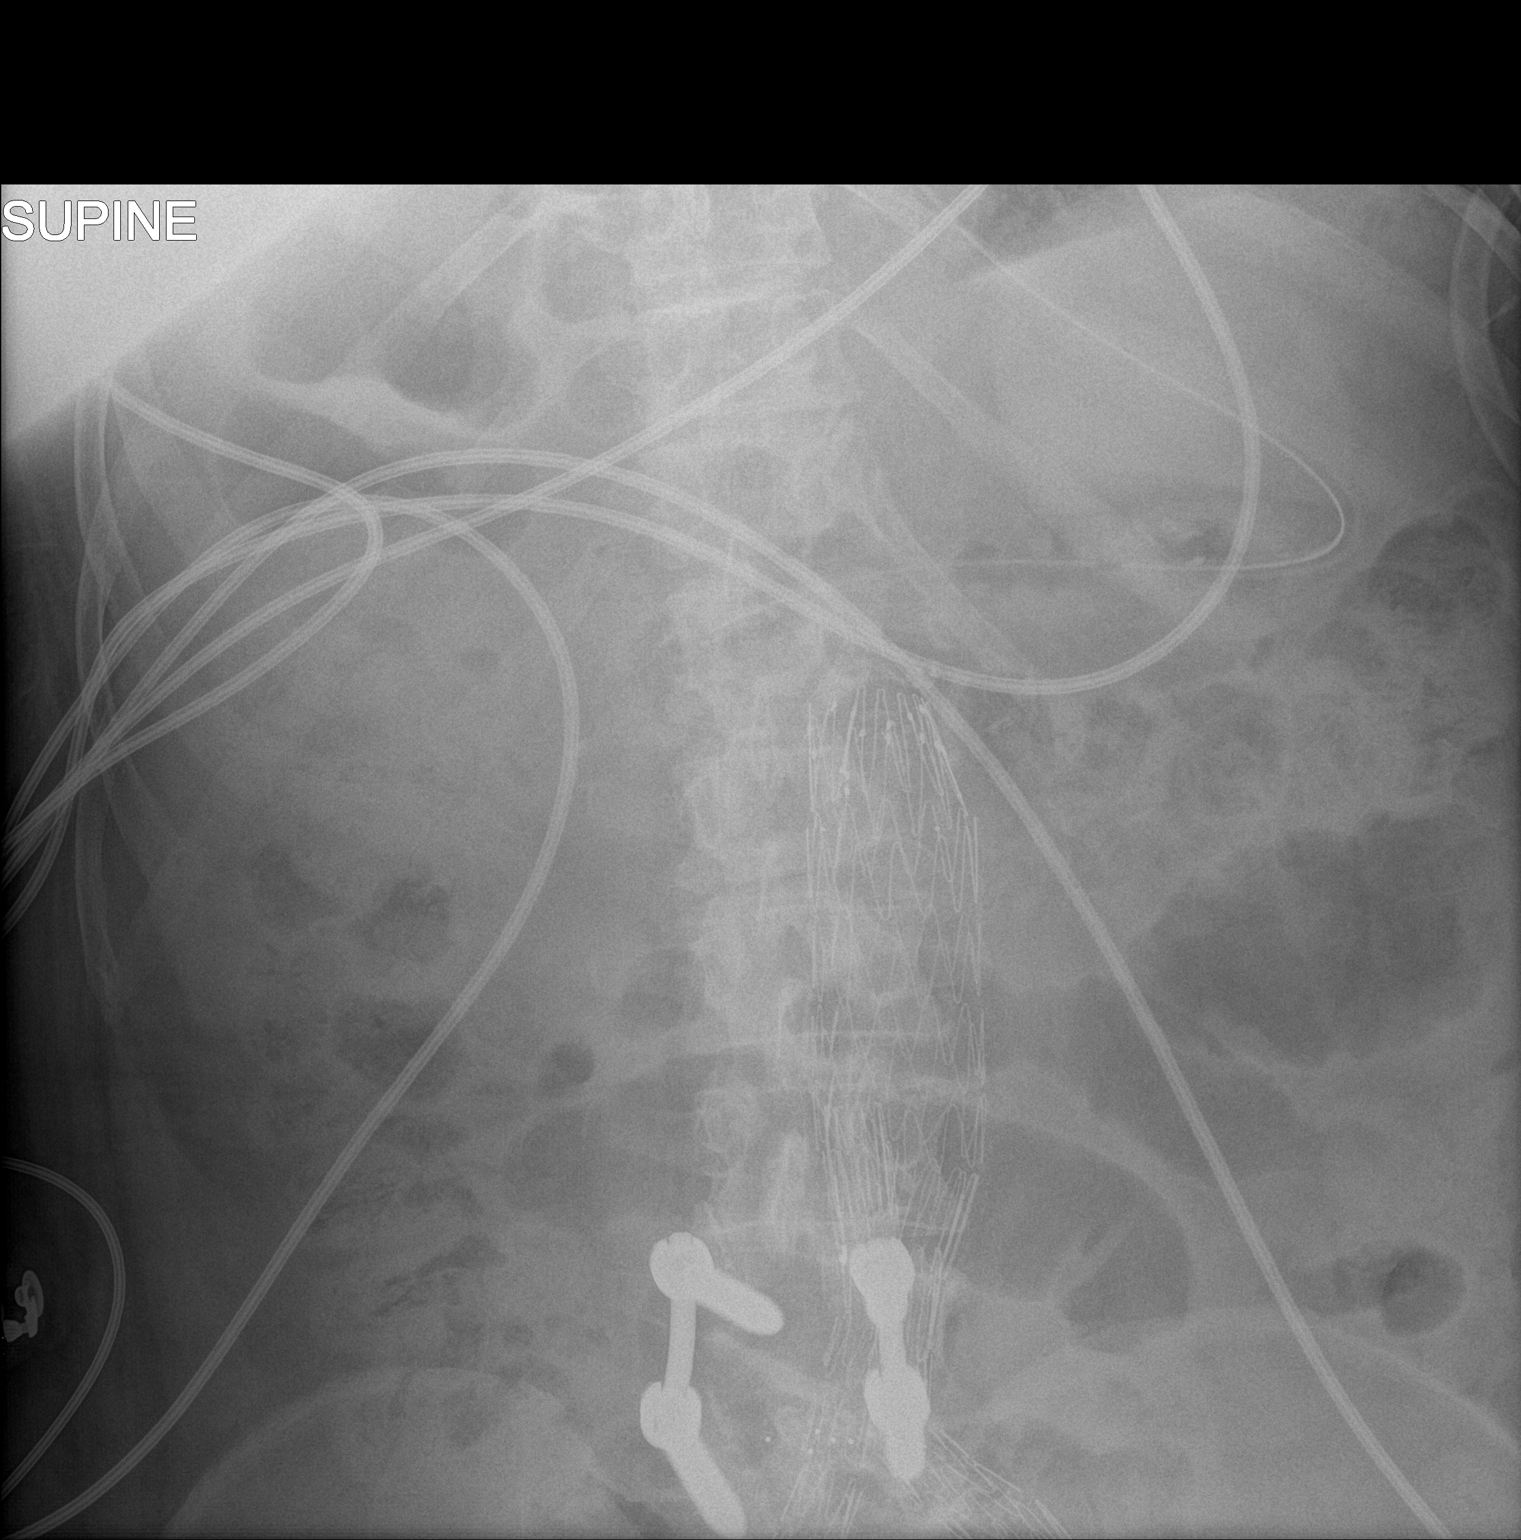

[1 of 1 positions shown; findings below may reference images not displayed]

FINDINGS: NG tube with tip in distal stomach. Aorto bi-iliac stent graft is
unchanged in position.
IMPRESSION: NG tube with tip in distal stomach.

## 2017-11-14 IMAGING — CR DG ABD PORTABLE 1V
2 series · 2 of 2 positions shown · non-contrast
Comparison: Supine abdominal radiograph of March 15, 2016 at
[DATE] p.m..

CLINICAL DATA: Status post esophagogastric tube placement.

EXAM:
PORTABLE ABDOMEN - 1 VIEW

[AP (1 of 2)]
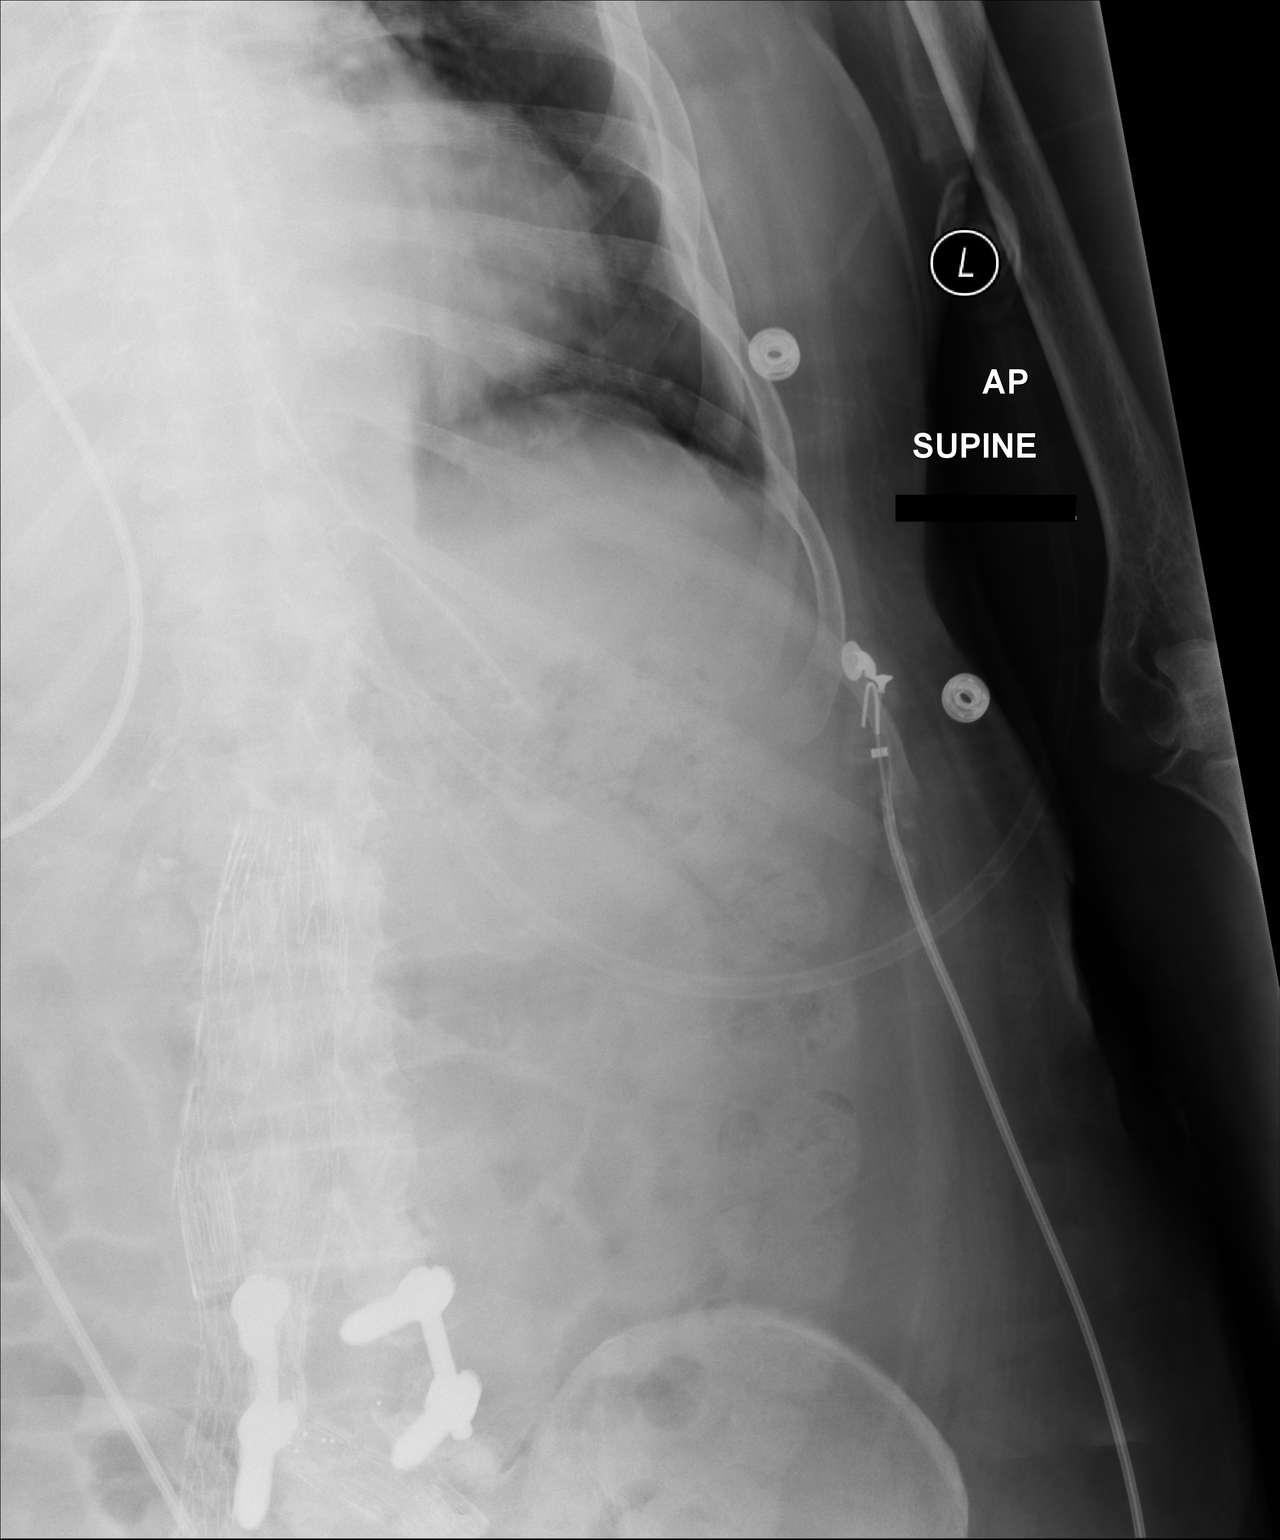

[AP (2 of 2)]
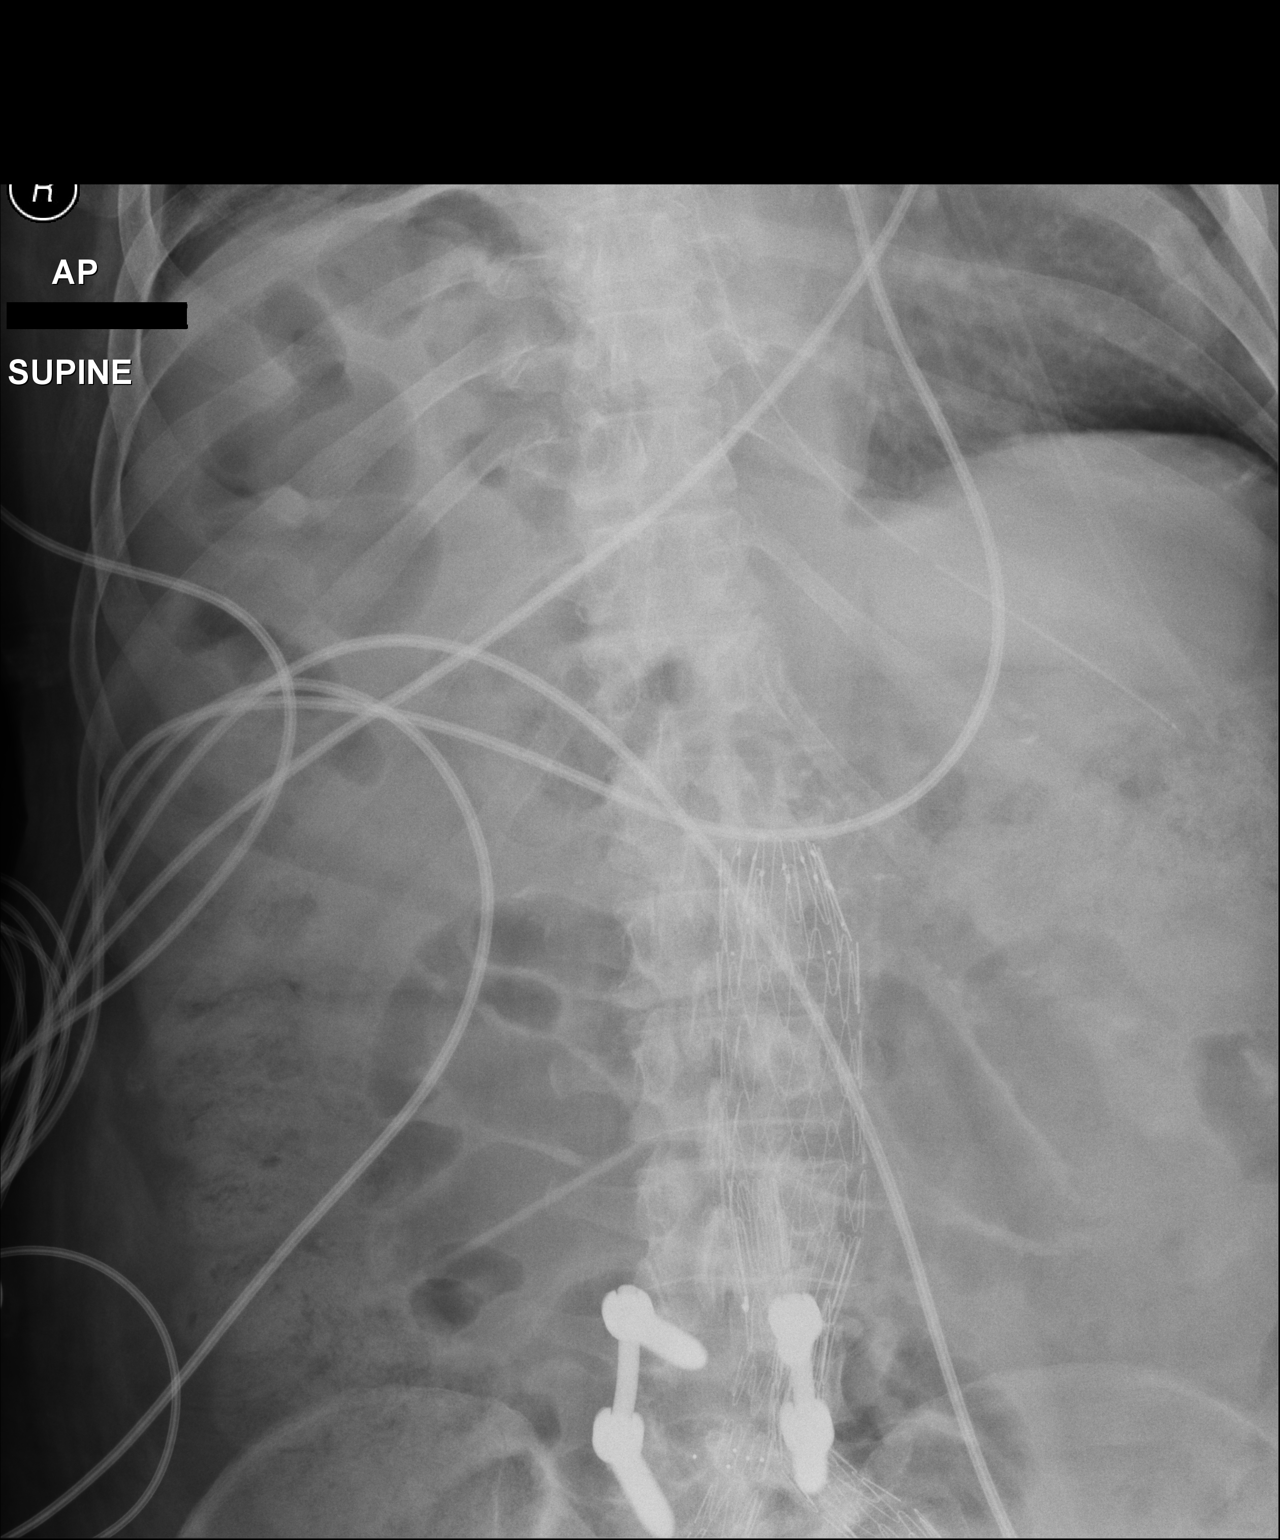

[2 of 2 positions shown; findings below may reference images not displayed]

FINDINGS: The radiodense tipped feeding tube is been removed. A nasogastric
tube is been placed. The proximal port lies at the level of the GE
junction with the distal port in the gastric cardia. Advancement by
at least 10 cm is recommended. There remains elevation of the right
hemidiaphragm. The bowel gas and stool pattern is unremarkable.
There is an aorto bi-iliac stent graft in place. The patient has
undergone previous posterior fusion at L5-S1.
IMPRESSION: Advancement of the nasogastric tube by 10 cm is needed to assure
that the proximal port lies below the level of the GE junction.

## 2017-11-14 IMAGING — CR DG ABD PORTABLE 1V
1 series · 1 of 1 positions shown · non-contrast
Comparison: Abdominal radiograph performed 03/14/2016

CLINICAL DATA: Nasogastric tube placement.  Initial encounter.

EXAM:
PORTABLE ABDOMEN - 1 VIEW

[AP]
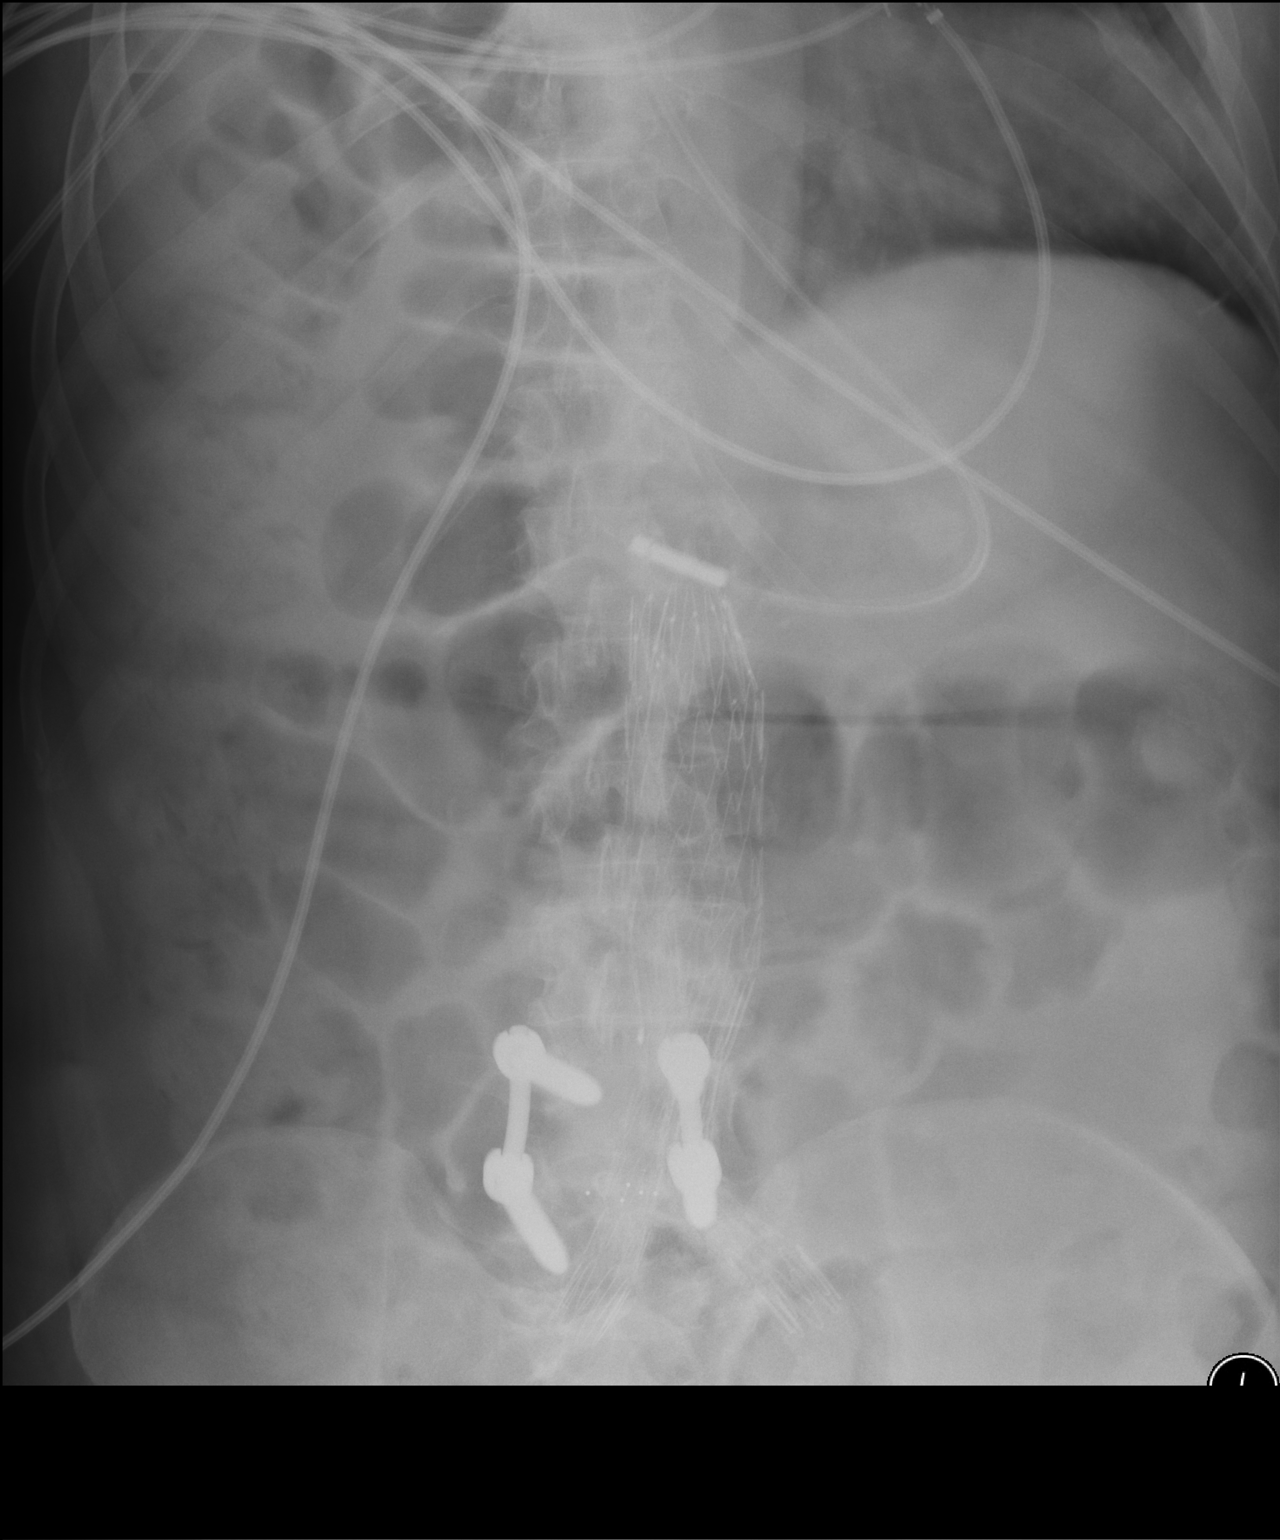

[1 of 1 positions shown; findings below may reference images not displayed]

FINDINGS: The patient's enteric tube is noted ending overlying the antrum of
the stomach.

The visualized bowel gas pattern is grossly unremarkable. There is
elevation of the right hemidiaphragm. The colon is largely filled
with air. No free intra-abdominal air is identified, though
evaluation for free air is limited on a single supine view. An
aortoiliac stent graft is noted. Lumbar spinal fusion hardware is
seen. No acute osseous abnormalities are identified.
IMPRESSION: Enteric tube noted ending overlying the antrum of the stomach.

## 2017-11-15 ENCOUNTER — Other Ambulatory Visit: Payer: Self-pay

## 2017-11-15 NOTE — Telephone Encounter (Signed)
Please advise if you will fill Lasix since it was given in hospital or have PCP to f/u with Lasix.

## 2017-11-15 NOTE — Telephone Encounter (Signed)
Please direct to PCP  Theodoro Grist

## 2017-11-18 IMAGING — DX DG CHEST 1V PORT
1 series · 1 of 1 positions shown · non-contrast
Comparison: March 15, 2016

CLINICAL DATA: Respiratory failure

EXAM:
PORTABLE CHEST 1 VIEW

[chest ap]
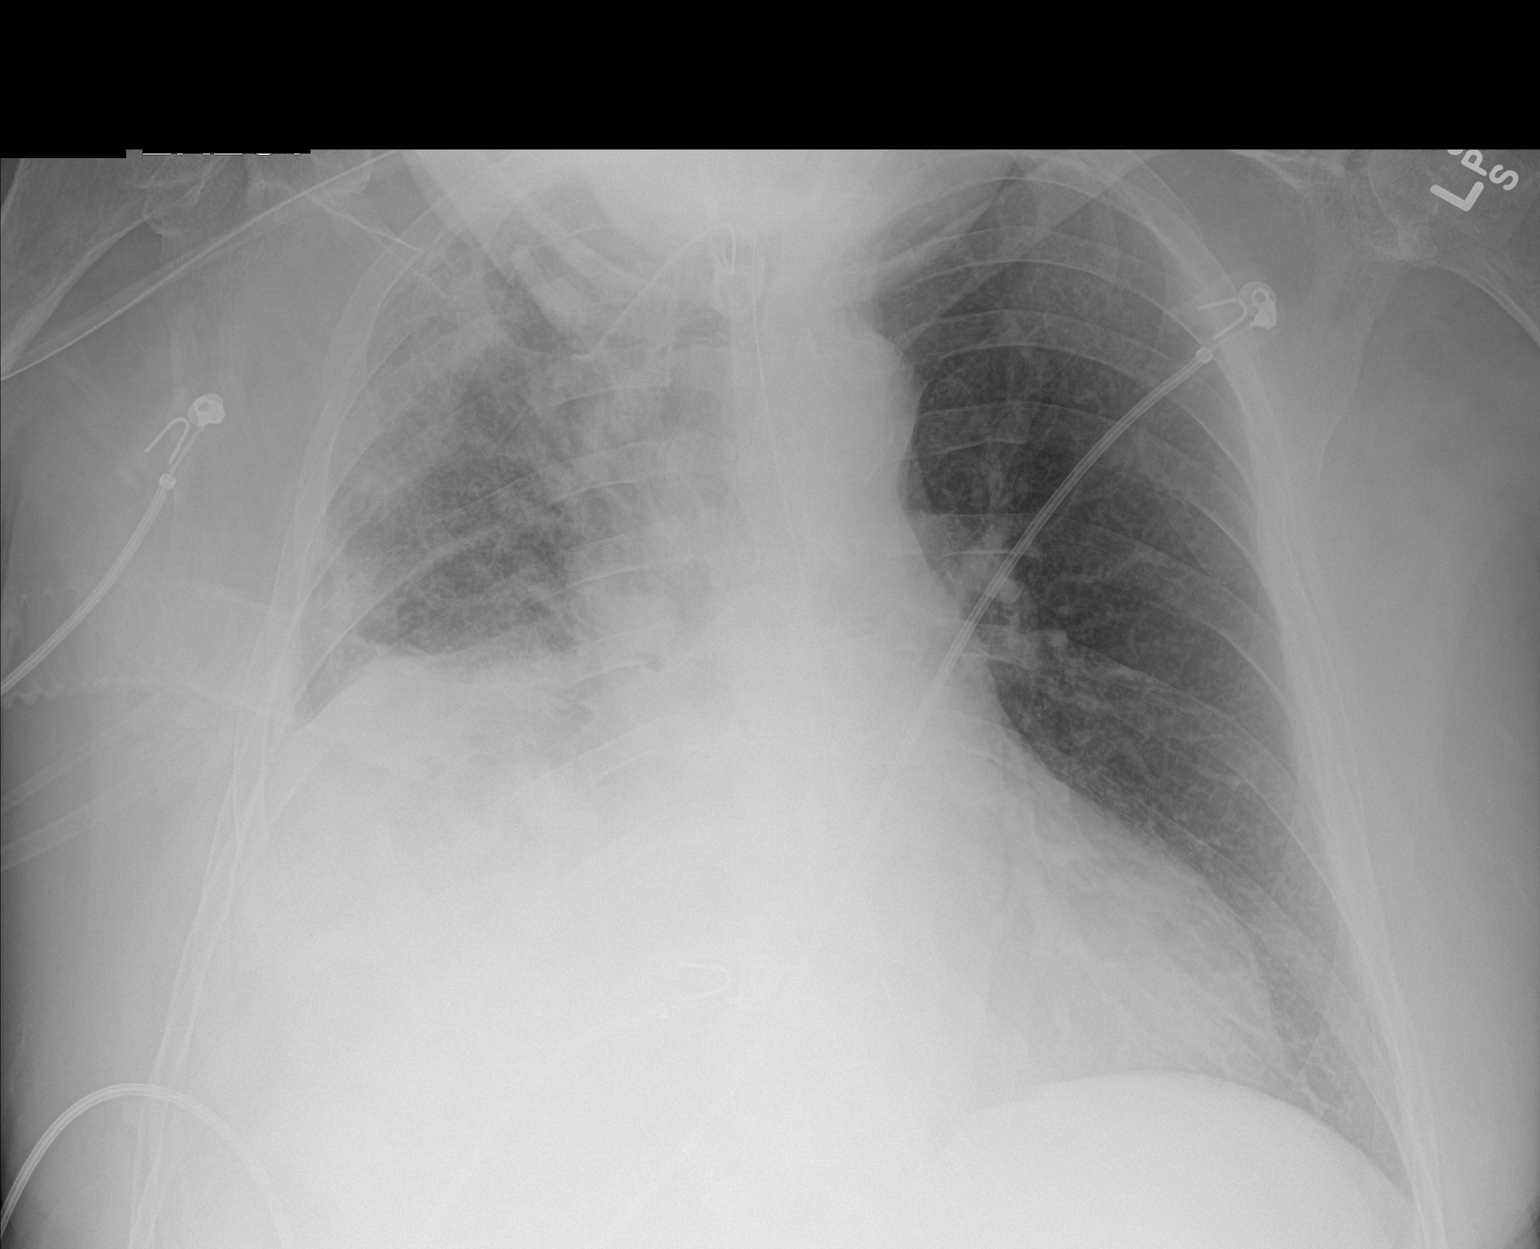

[1 of 1 positions shown; findings below may reference images not displayed]

FINDINGS: Tracheostomy catheter tip is 6.5 cm above the carina. Nasogastric
tube tip and side port are below the diaphragm. Central catheter no
longer appreciable. No pneumothorax. There is persistent elevation
of the right hemidiaphragm. There is underlying interstitial edema.
There has been significant partial clearing of airspace
consolidation throughout much of the right lung compared to most
recent study. No new opacity is evident. Heart is mildly enlarged
with pulmonary vascularity stable and felt to be within normal
limits. There is atherosclerotic calcification in the aorta. No
adenopathy evident. There is postoperative change in the cervical
spine region.
IMPRESSION: Significant partial clearing of airspace consolidation on the right,
with patchy residual airspace opacity. Suspect residual patchy
pneumonia on the right. There is a degree of underlying interstitial
edema with cardiomegaly, likely underlying congestive heart failure.
No new opacity evident. Cardiac silhouette appears stable compared
to most recent study. Stable elevation of the right hemidiaphragm.
Tube positions as described without evident pneumothorax. Aortic
atherosclerosis.

## 2017-11-22 IMAGING — CT CT CHEST W/O CM
2 of 3 series · 15 of 36 positions shown, 18 images · non-contrast
Comparison: Chest radiography same day.  Chest CT 03/03/2016.

CLINICAL DATA: Respiratory distress.  Abnormal chest radiography.

EXAM:
CT CHEST WITHOUT CONTRAST
TECHNIQUE: Multidetector CT imaging of the chest was performed following the
standard protocol without IV contrast.

[Series 2: chest w/o 2mm st · axial · non-contrast · 0.84mm/px · z∈[+175,+425]mm · 12 of 147 slices shown, 15 images]
[im 11/147  mediastinal]
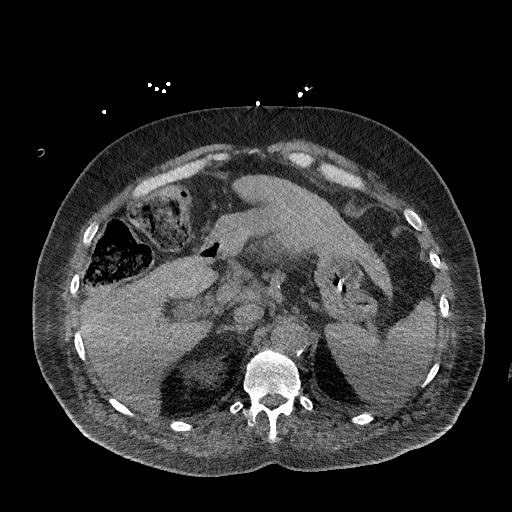
[im 11/147  lung]
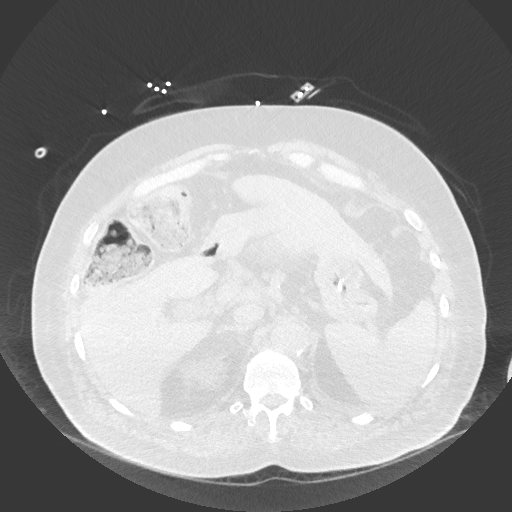
[im 22/147  lung]
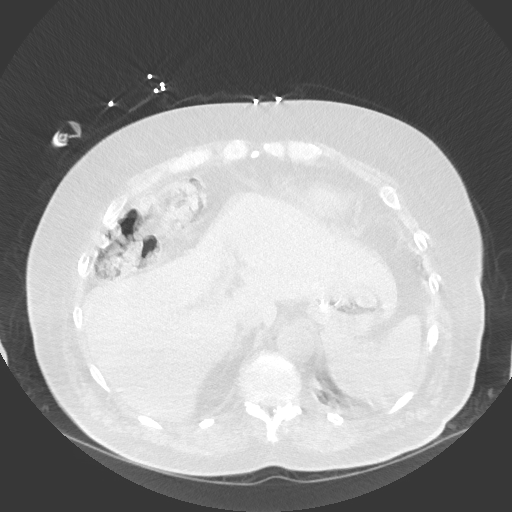
[im 33/147  lung]
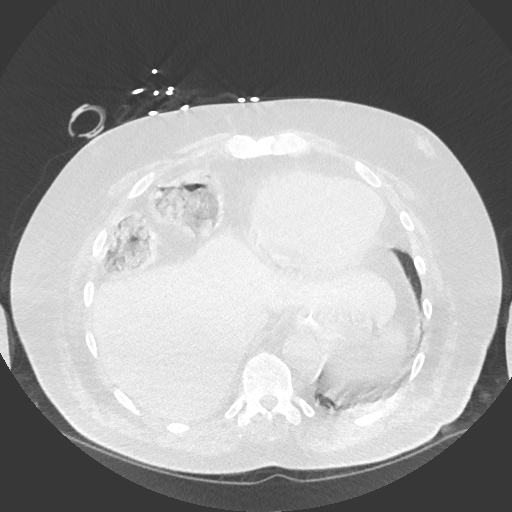
[im 44/147  lung]
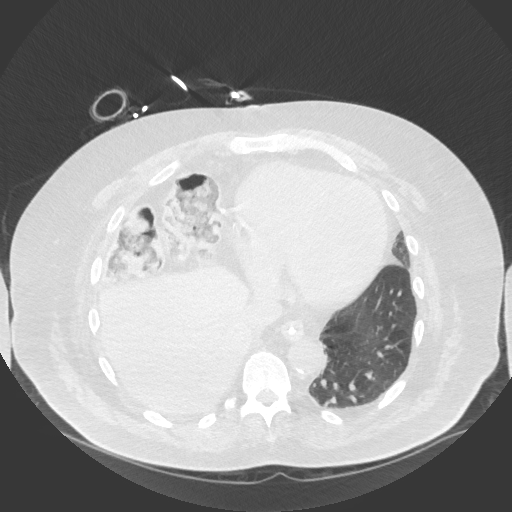
[im 55/147  mediastinal]
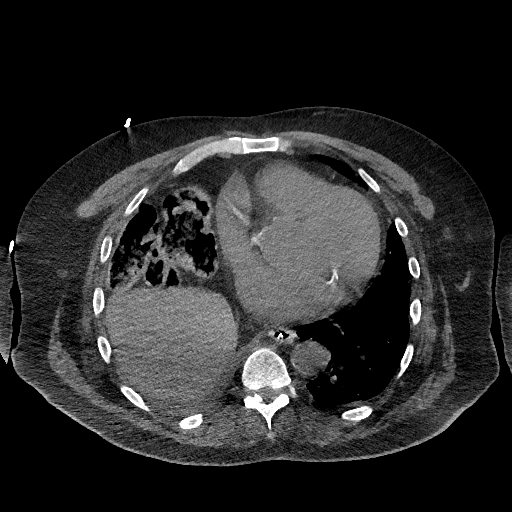
[im 55/147  lung]
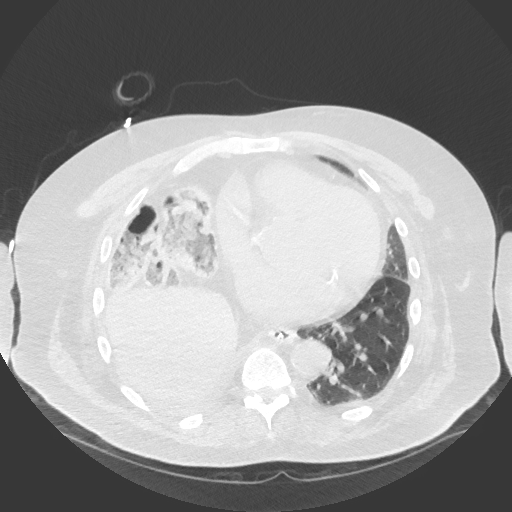
[im 65/147  lung]
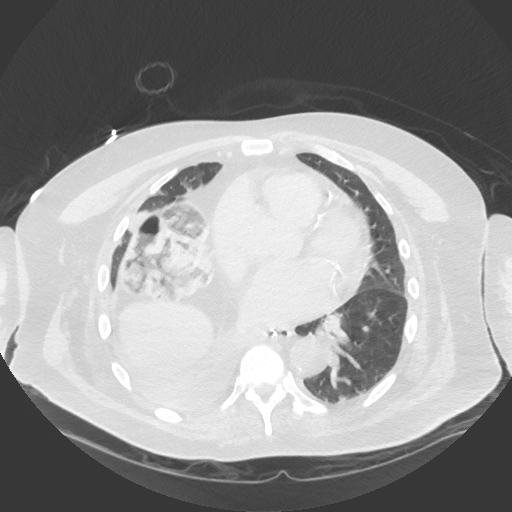
[im 82/147  lung]
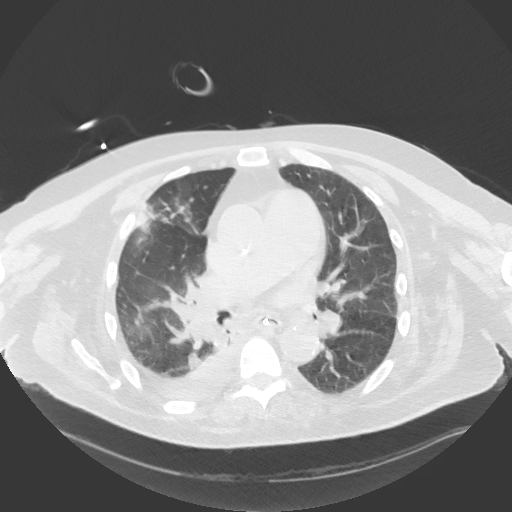
[im 92/147  lung]
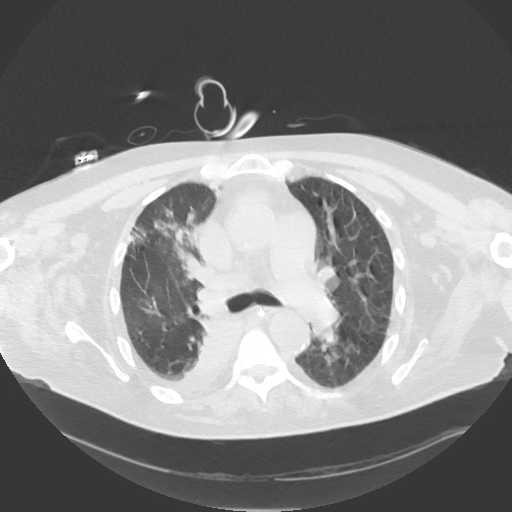
[im 103/147  mediastinal]
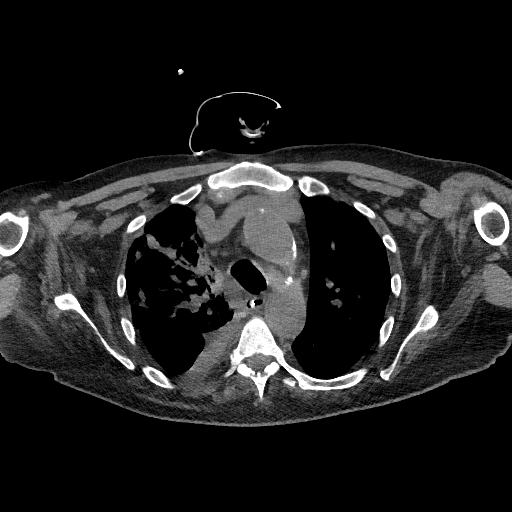
[im 103/147  lung]
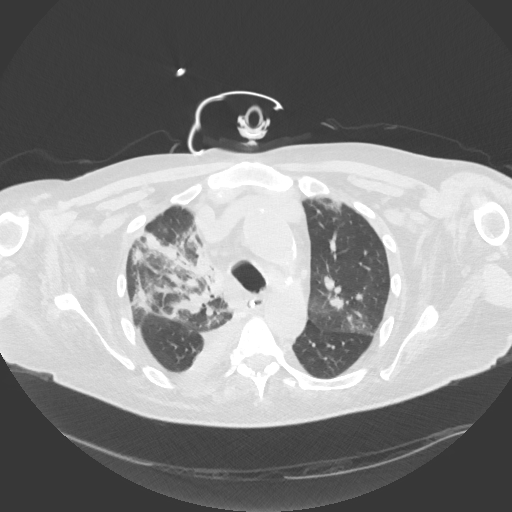
[im 114/147  lung]
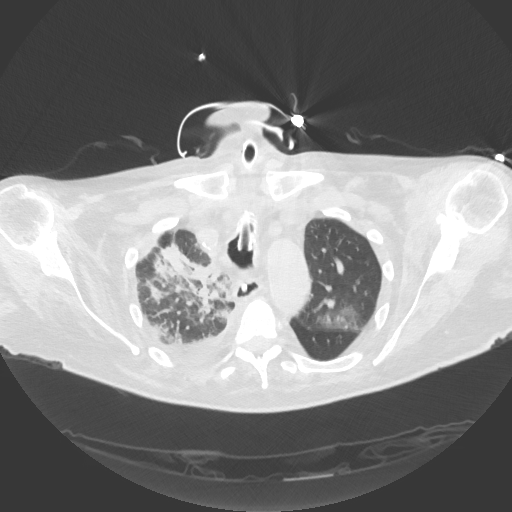
[im 125/147  lung]
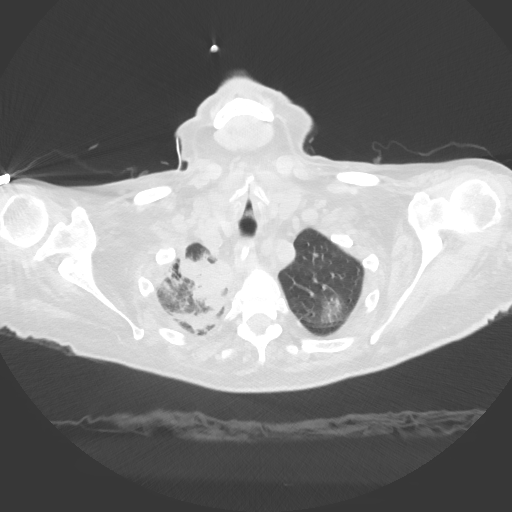
[im 136/147  lung]
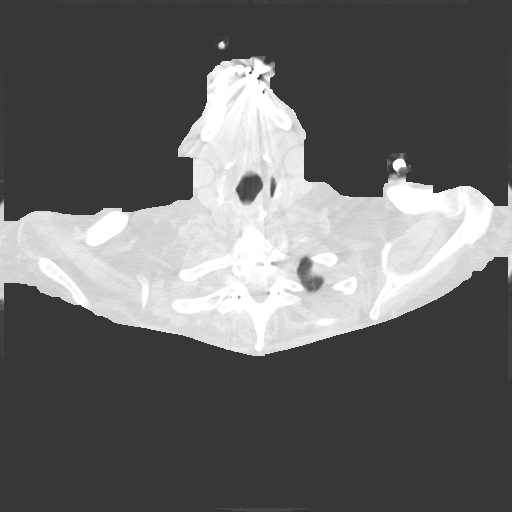

[Series 4: chest w/o 3mm st cor · coronal · non-contrast · 0.58mm/px · 3 of 101 slices shown]
[im 21/101  lung]
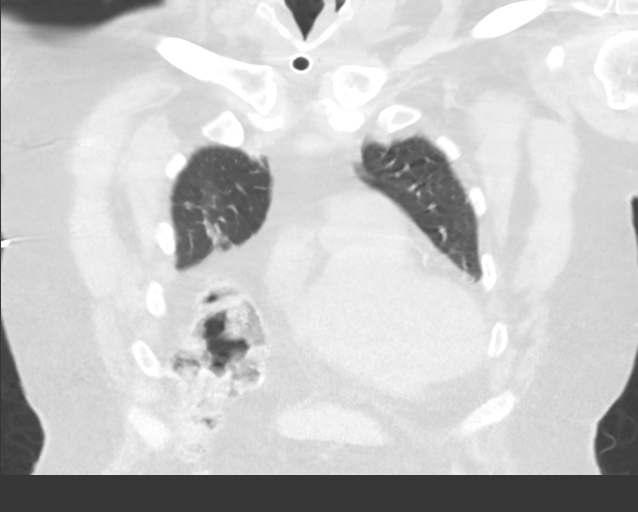
[im 41/101  lung]
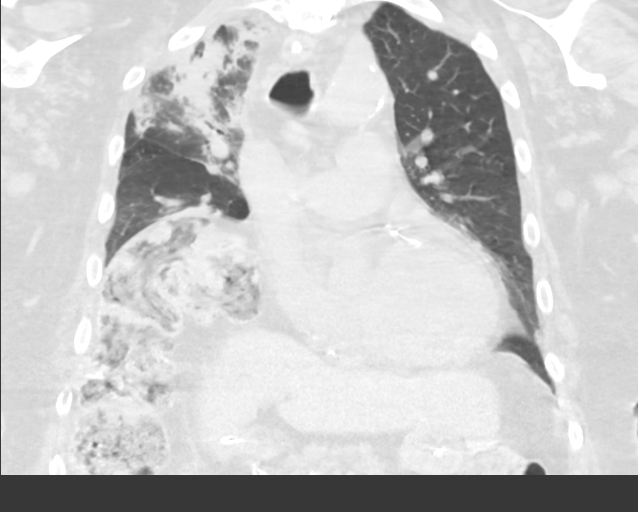
[im 61/101  lung]
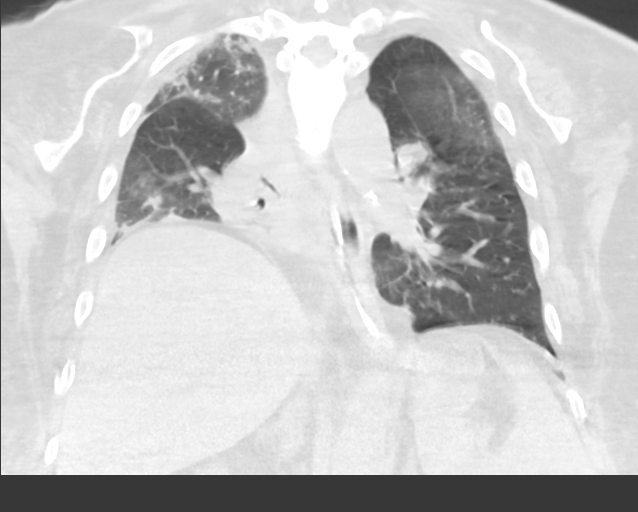

[15 of 36 positions shown; findings below may reference images not displayed]

FINDINGS: Cardiovascular: Coronary artery and aortic atherosclerosis.

Mediastinum/Nodes: No worrisome lymph nodes. No masses. Tracheostomy
grossly well positioned. Nasogastric tube enters the abdomen.

Lungs/Pleura: Right hemidiaphragm is elevated. No pleural fluid on
the left. Minimal hazy infiltrate present in the apex of the left
upper lobe. The right chest shows a small effusion largely layering
dependently. There is extensive airspace filling in the upper lobe
consistent with bronchopneumonia. There is patchy density in the
right lower lobe in the right middle lobe that could be atelectasis
or lesser bronchopneumonia. The appearance is improved since the CT
of 03/03/2016.

Upper Abdomen: Calcified gallstones as seen previously.

Musculoskeletal: No significant bone finding.
IMPRESSION: Improved on the right since 03/03/2016. Elevated right
hemidiaphragm. Small effusion on the right layering dependently.
Infiltrate in the right upper lobe consistent with bronchopneumonia.
Lesser infiltrate/ atelectasis in the right lower lobe and right
middle lobe. Small area patchy airspace filling at the left apex
consistent with bronchopneumonia. This left apical infiltrate is
actually slightly worsened when compared to the previous CT.

## 2017-11-25 IMAGING — CR DG ABD PORTABLE 1V
2 series · 2 of 2 positions shown · non-contrast
Comparison: Radiograph March 15, 2016.

CLINICAL DATA: Nasogastric tube placement.

EXAM:
PORTABLE ABDOMEN - 1 VIEW

[AP (1 of 2)]
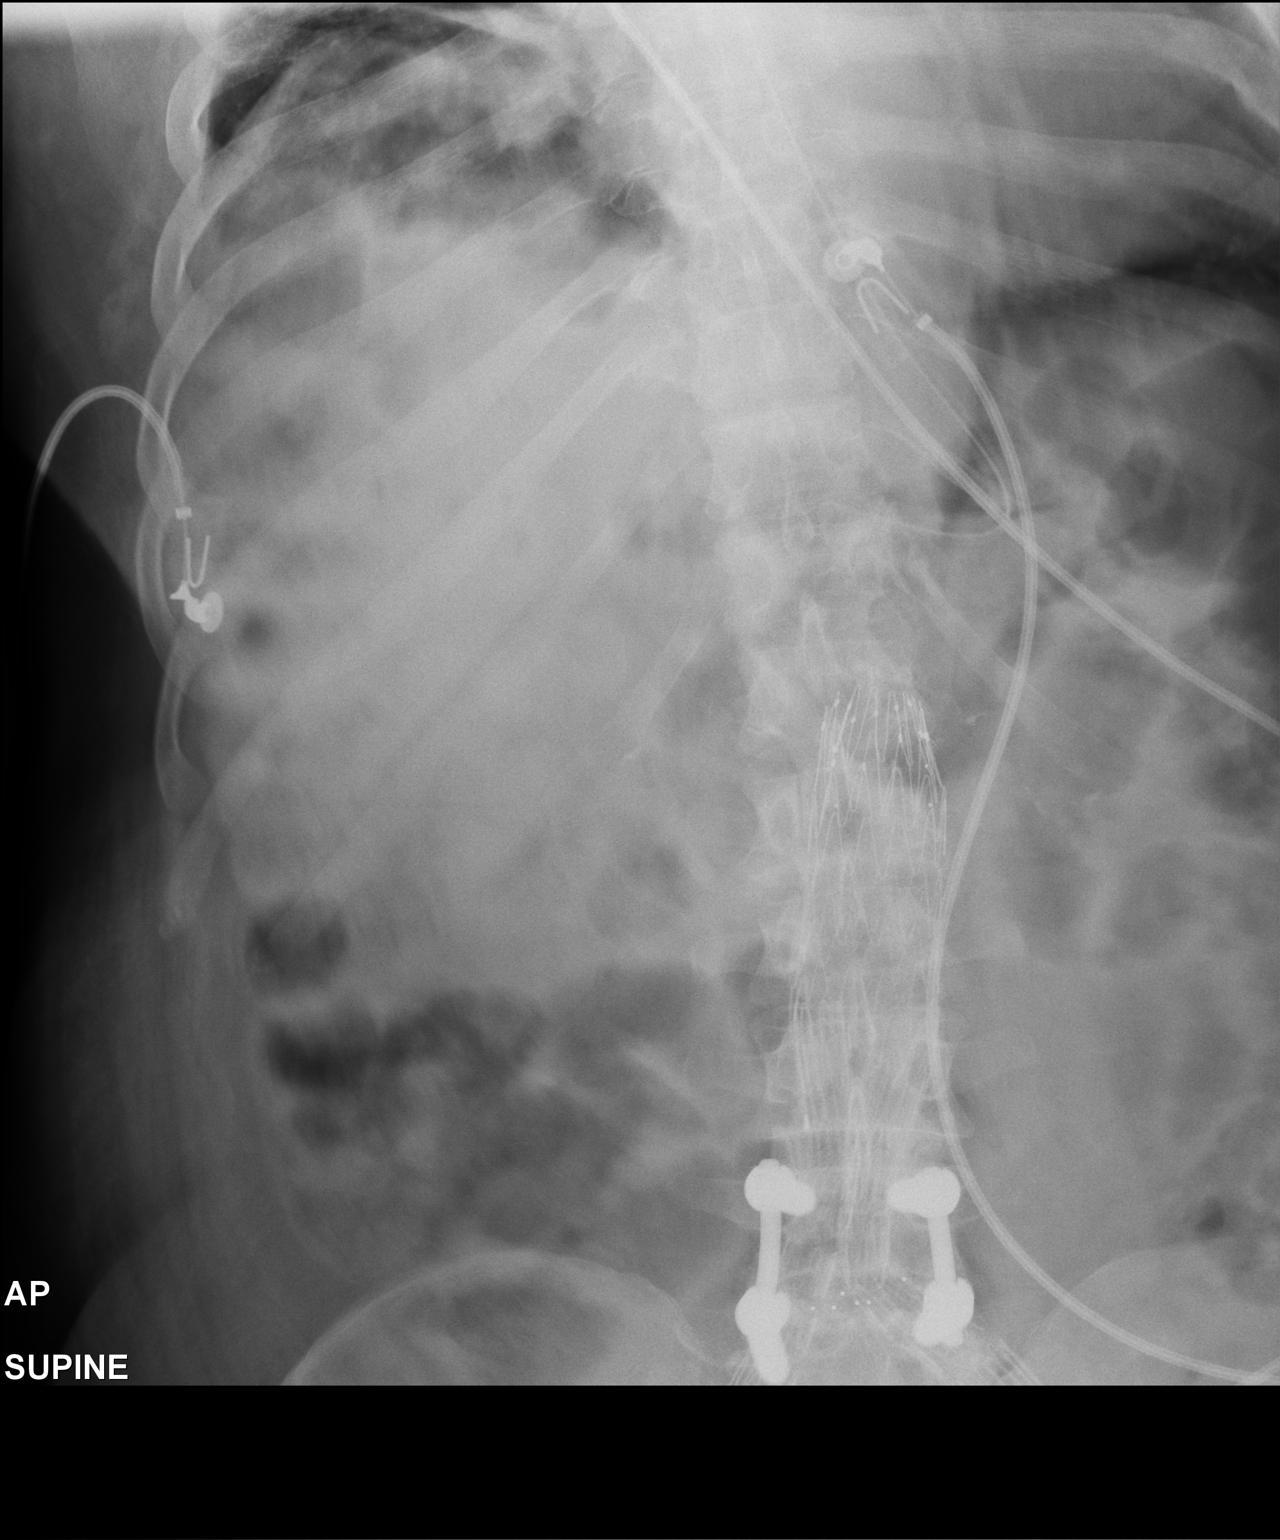

[AP (2 of 2)]
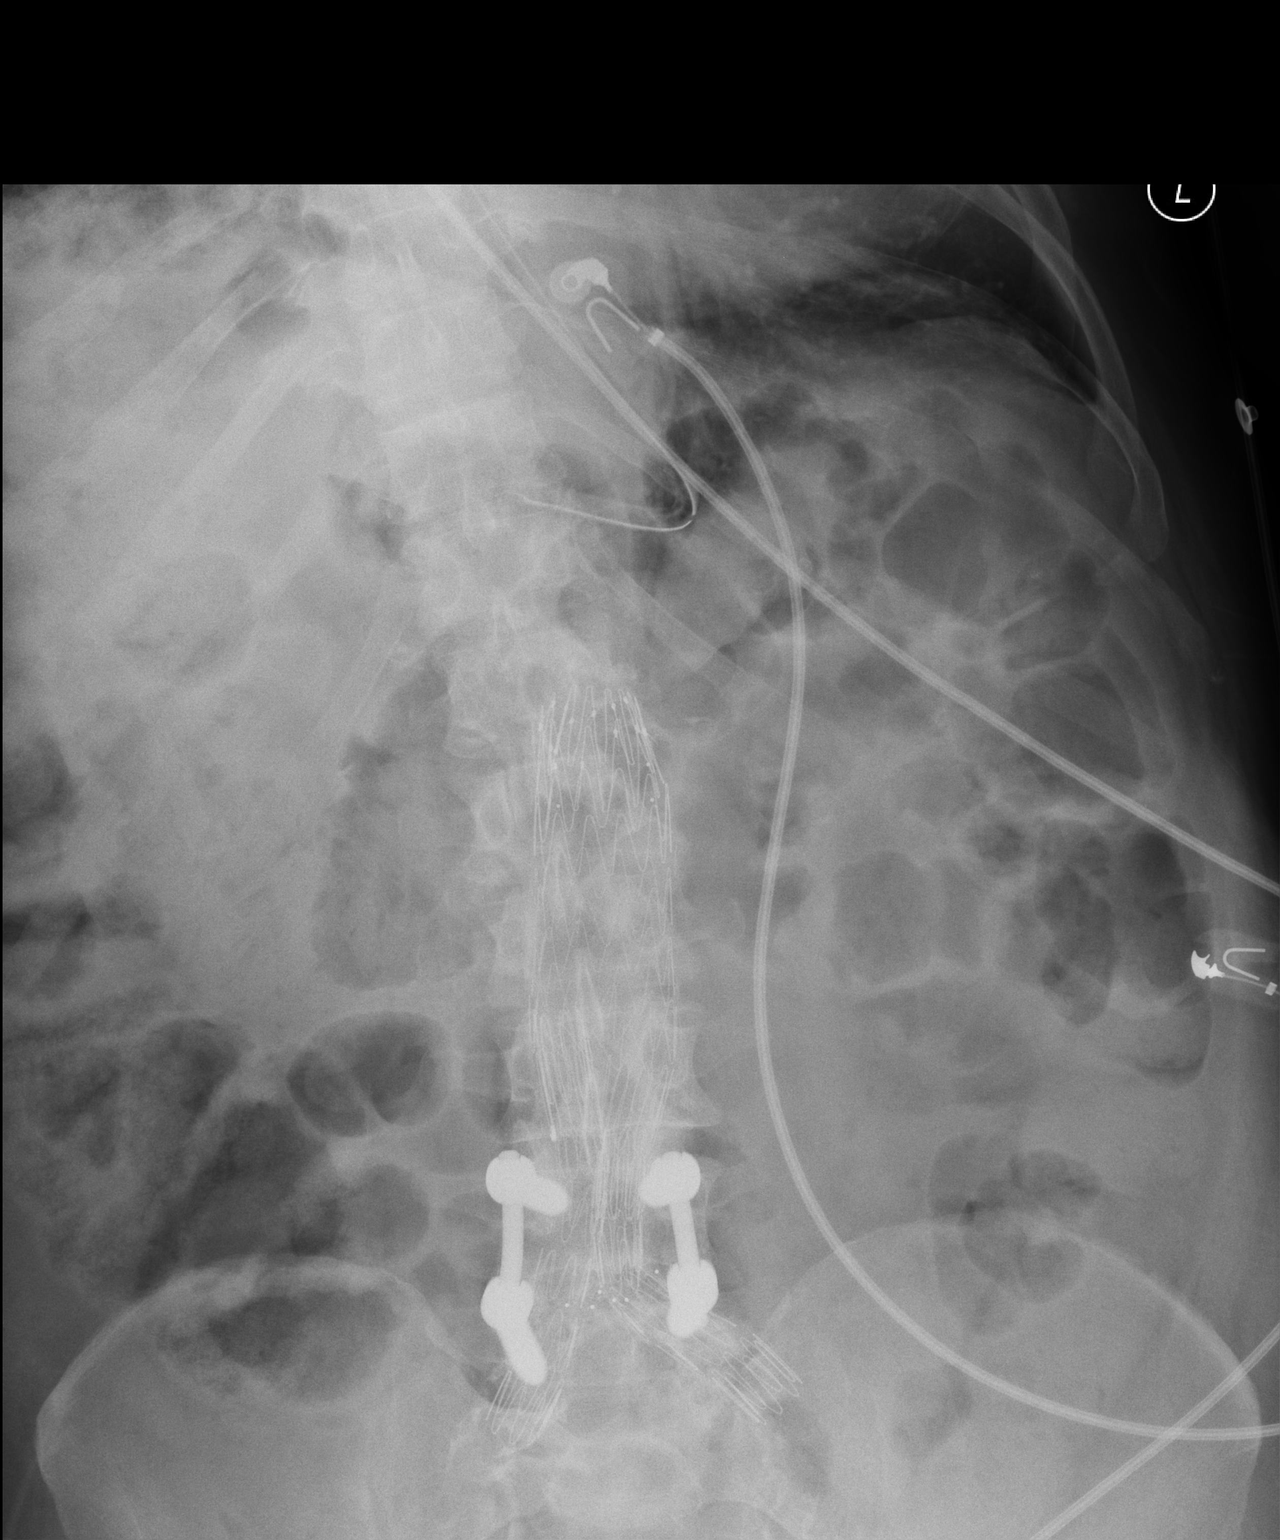

[2 of 2 positions shown; findings below may reference images not displayed]

FINDINGS: Nasogastric tube tip is seen in expected position of proximal
stomach. No abnormal bowel dilatation is noted. Moderate amount of
stool is noted in the colon. Status post stent graft repair of
abdominal aortic aneurysm.
IMPRESSION: Nasogastric tube tip seen in expected position of proximal stomach.
No evidence of bowel obstruction.

## 2017-11-30 IMAGING — DX DG CHEST 1V PORT
1 series · 1 of 1 positions shown · non-contrast
Comparison: CT chest ends plain film of the chest 03/23/2016.

CLINICAL DATA: Respiratory failure.

EXAM:
PORTABLE CHEST 1 VIEW

[chest ap]
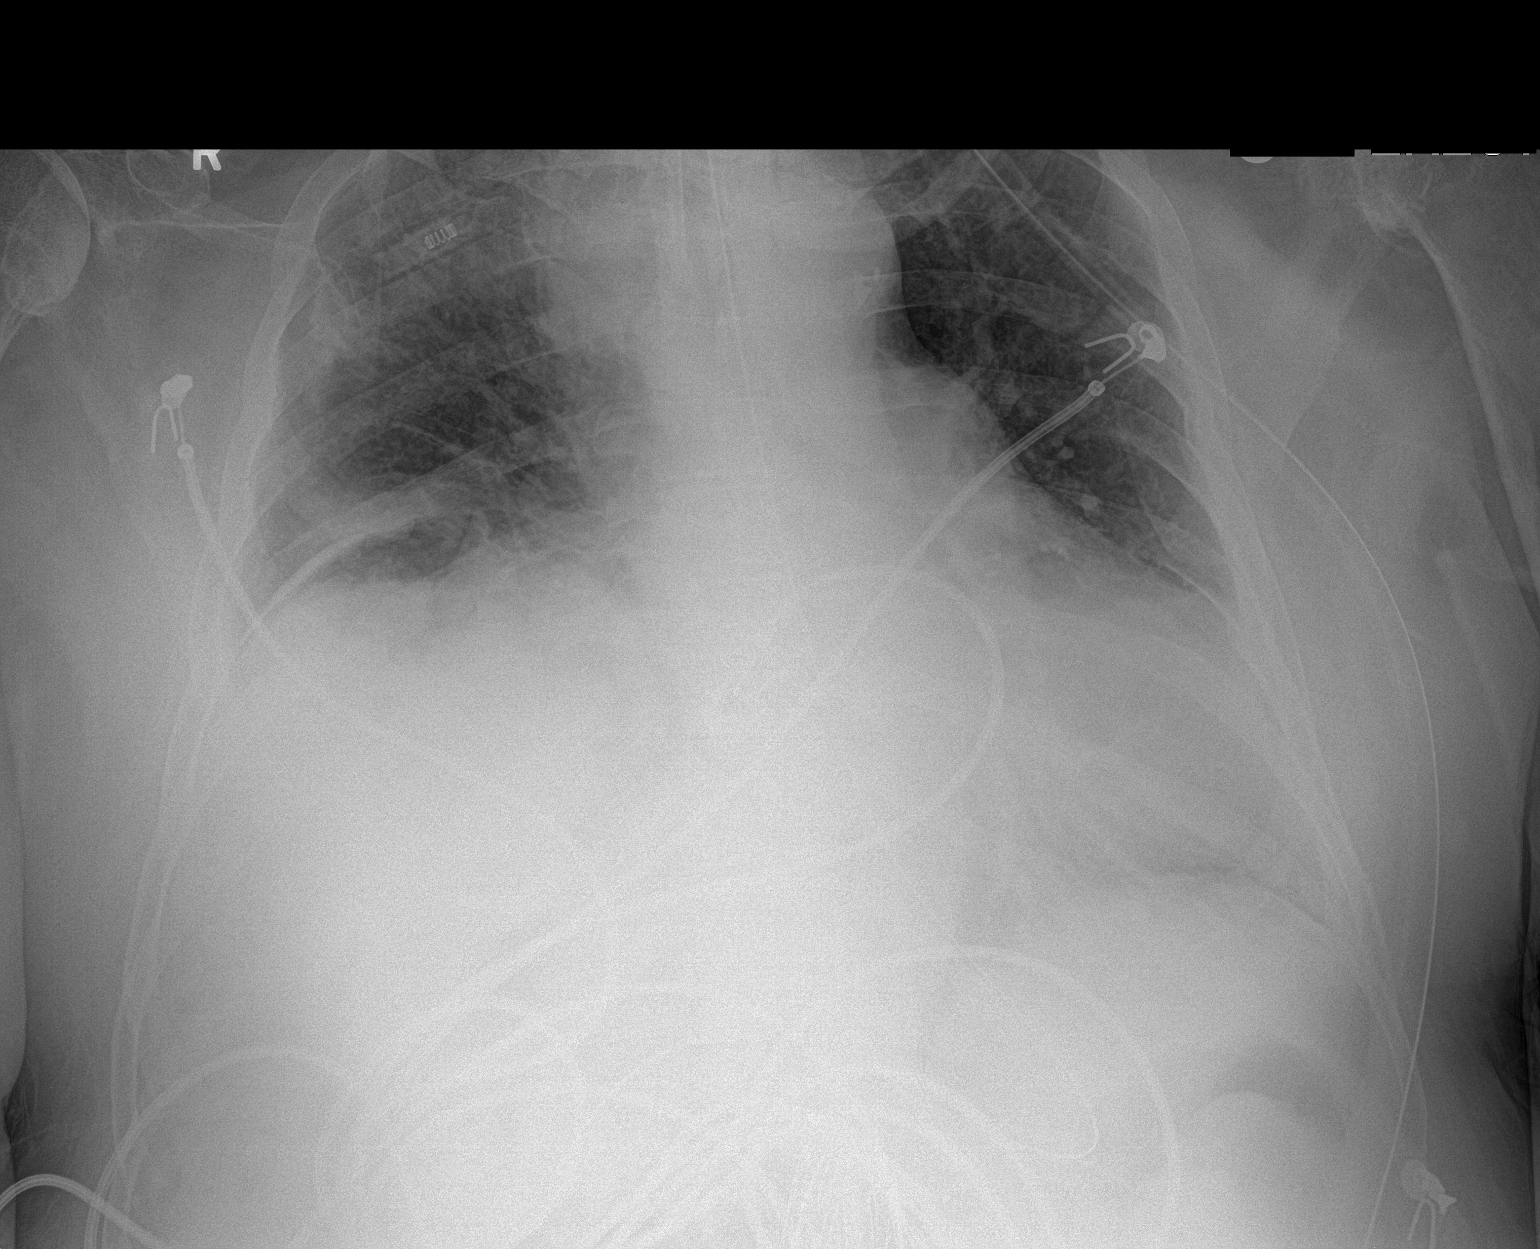

[1 of 1 positions shown; findings below may reference images not displayed]

FINDINGS: Tracheostomy tube and NG tube are in place. Elevation of the right
hemidiaphragm relative to the left is again seen. There is patchy
airspace disease throughout the right chest and likely a small right
effusion. The left lung appears clear. Heart size is mildly
enlarged.
IMPRESSION: No change in patchy airspace disease in the right chest worrisome
for pneumonia.

Small right pleural effusion.

## 2017-12-05 IMAGING — XA IR PERC PLACEMENT GASTROSTOMY
2 series · 3 of 3 positions shown · non-contrast
Comparison: none

CLINICAL DATA: Respiratory failure and need for gastrostomy tube
for nutrition.

[Series 1: fl (-) angio · 1 of 1 slices shown (1 of 2)]
[im 1/1]
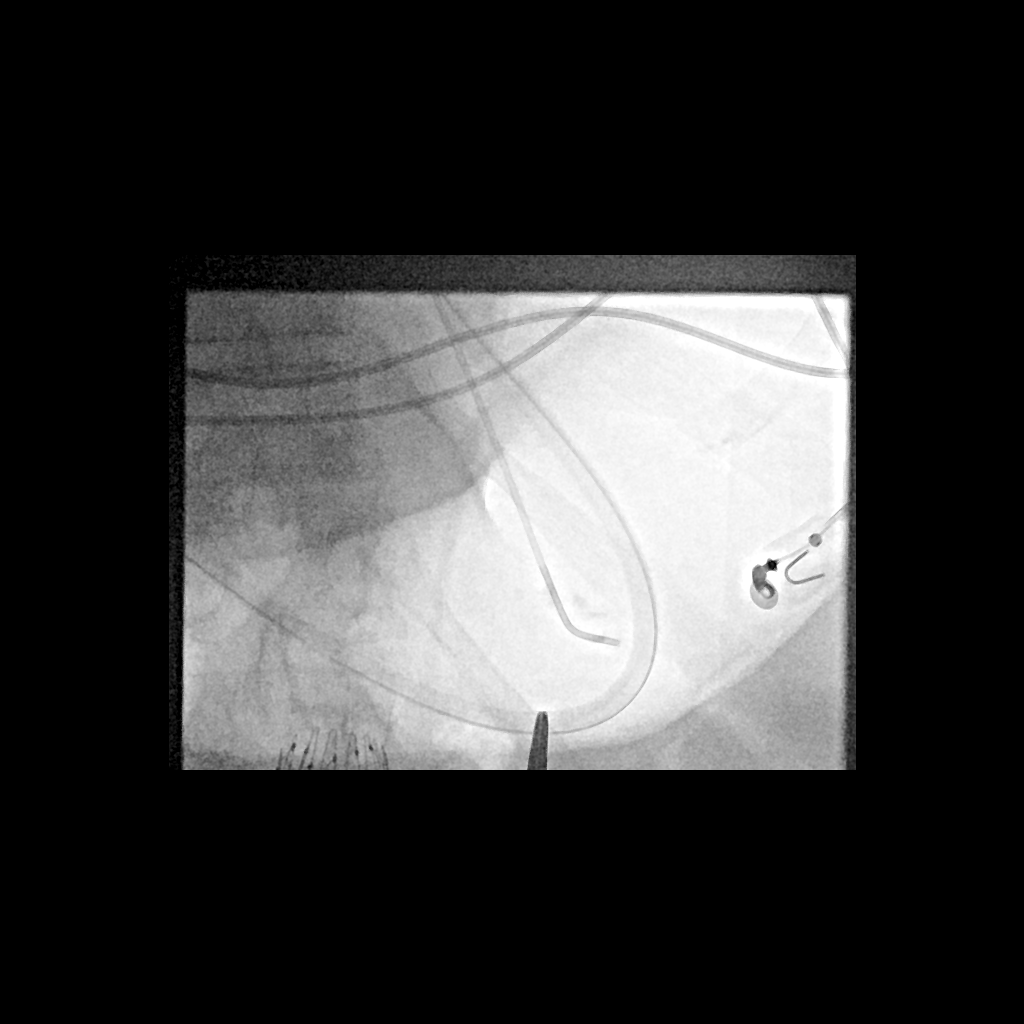

[Series 2: fl (-) angio · 2 of 2 slices shown (2 of 2)]
[im 1/2]
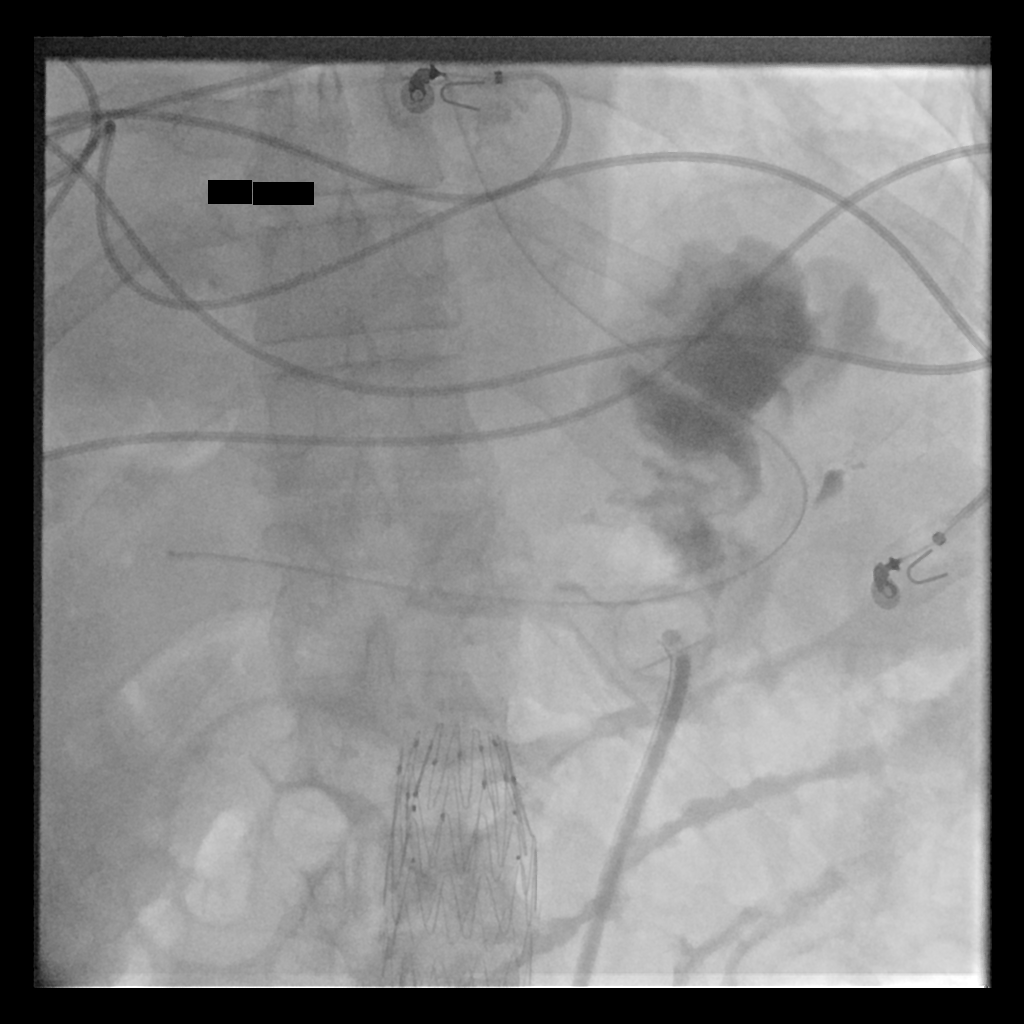
[im 2/2]
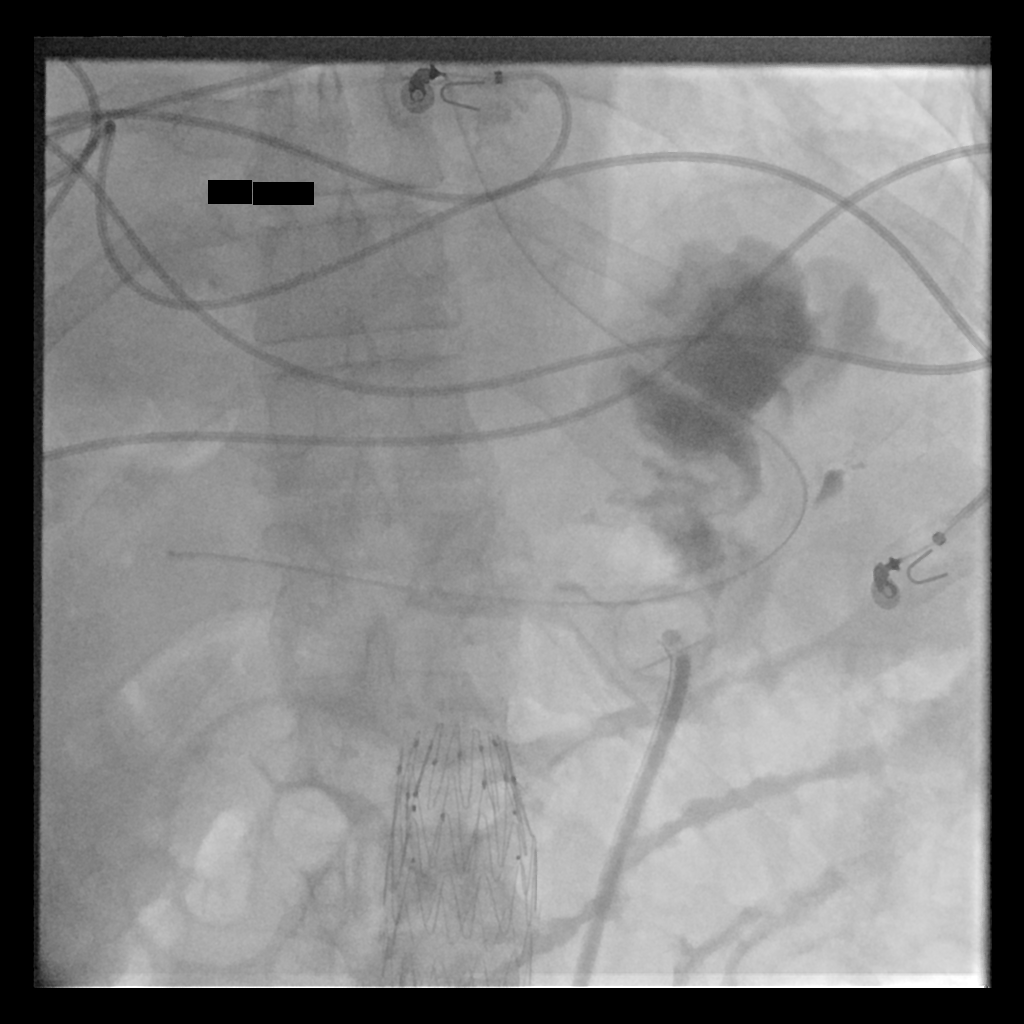

[3 of 3 positions shown; findings below may reference images not displayed]

EXAM:
PERCUTANEOUS GASTROSTOMY TUBE PLACEMENT

ANESTHESIA/SEDATION:
2.0 mg IV Versed; 100 mcg IV Fentanyl.

Total Moderate Sedation Time

10 minutes.

CONTRAST:  25 mL [id number] injected into the gastric lumen.

MEDICATIONS:
1 g IV vancomycin.. Vancomycin was given within two hours of
incision. Vancomycin was given due to an antibiotic allergy.

FLUOROSCOPY TIME:  1 minute and 54 seconds, 19 mGy.

PROCEDURE:
The procedure, risks, benefits, and alternatives were explained to
the patient's brother who is power of attorney. Questions regarding
the procedure were encouraged and answered. The patient's brother
understands and consents to the procedure.

A 5-French catheter was then advanced through the the patient's
mouth under fluoroscopy into the esophagus and to the level of the
stomach. This catheter was used to insufflate the stomach with air
under fluoroscopy.

The abdominal wall was prepped with chlorhexidine in a sterile
fashion, and a sterile drape was applied covering the operative
field. A sterile gown and sterile gloves were used for the
procedure. Local anesthesia was provided with 1% Lidocaine.

A skin incision was made in the upper abdominal wall. Under
fluoroscopy, an 18 gauge trocar needle was advanced into the
stomach. Contrast injection was performed to confirm intraluminal
position of the needle tip. A single T tack was then deployed in the
lumen of the stomach. This was brought up to tension at the skin
surface.

Over a guidewire, a 9-French sheath was advanced into the lumen of
the stomach. The wire was left in place as a safety wire. A loop
snare device from a percutaneous gastrostomy kit was then advanced
into the stomach.

A floppy guide wire was advanced through the orogastric catheter
under fluoroscopy in the stomach. The loop snare advanced through
the percutaneous gastric access was used to snare the guide wire.
This allowed withdrawal of the loop snare out of the patient's mouth
by retraction of the orogastric catheter and wire.

A 20-French bumper retention gastrostomy tube was looped around the
snare device. It was then pulled back through the patient's mouth.
The retention bumper was brought up to the anterior gastric wall.
The T tack suture was cut at the skin. The exiting gastrostomy tube
was cut to appropriate length and a feeding adapter applied. The
catheter was injected with contrast material to confirm position and
a fluoroscopic spot image saved. The tube was then flushed with
saline. A dressing was applied over the gastrostomy exit site.

COMPLICATIONS:
None.
FINDINGS: The stomach distended well with air allowing safe placement of the
gastrostomy tube. After placement, the tip of the gastrostomy tube
lies in the body of the stomach.
IMPRESSION: Percutaneous gastrostomy with placement of a 20-French bumper
retention tube in the body of the stomach. This tube can be used for
percutaneous feeds beginning in 24 hours after placement.

## 2017-12-06 IMAGING — CR DG CHEST 1V PORT
1 series · 1 of 1 positions shown · non-contrast
Comparison: Chest radiograph dated 03/31/2016

CLINICAL DATA: 70-year-old male with fever

EXAM:
PORTABLE CHEST 1 VIEW

[AP]
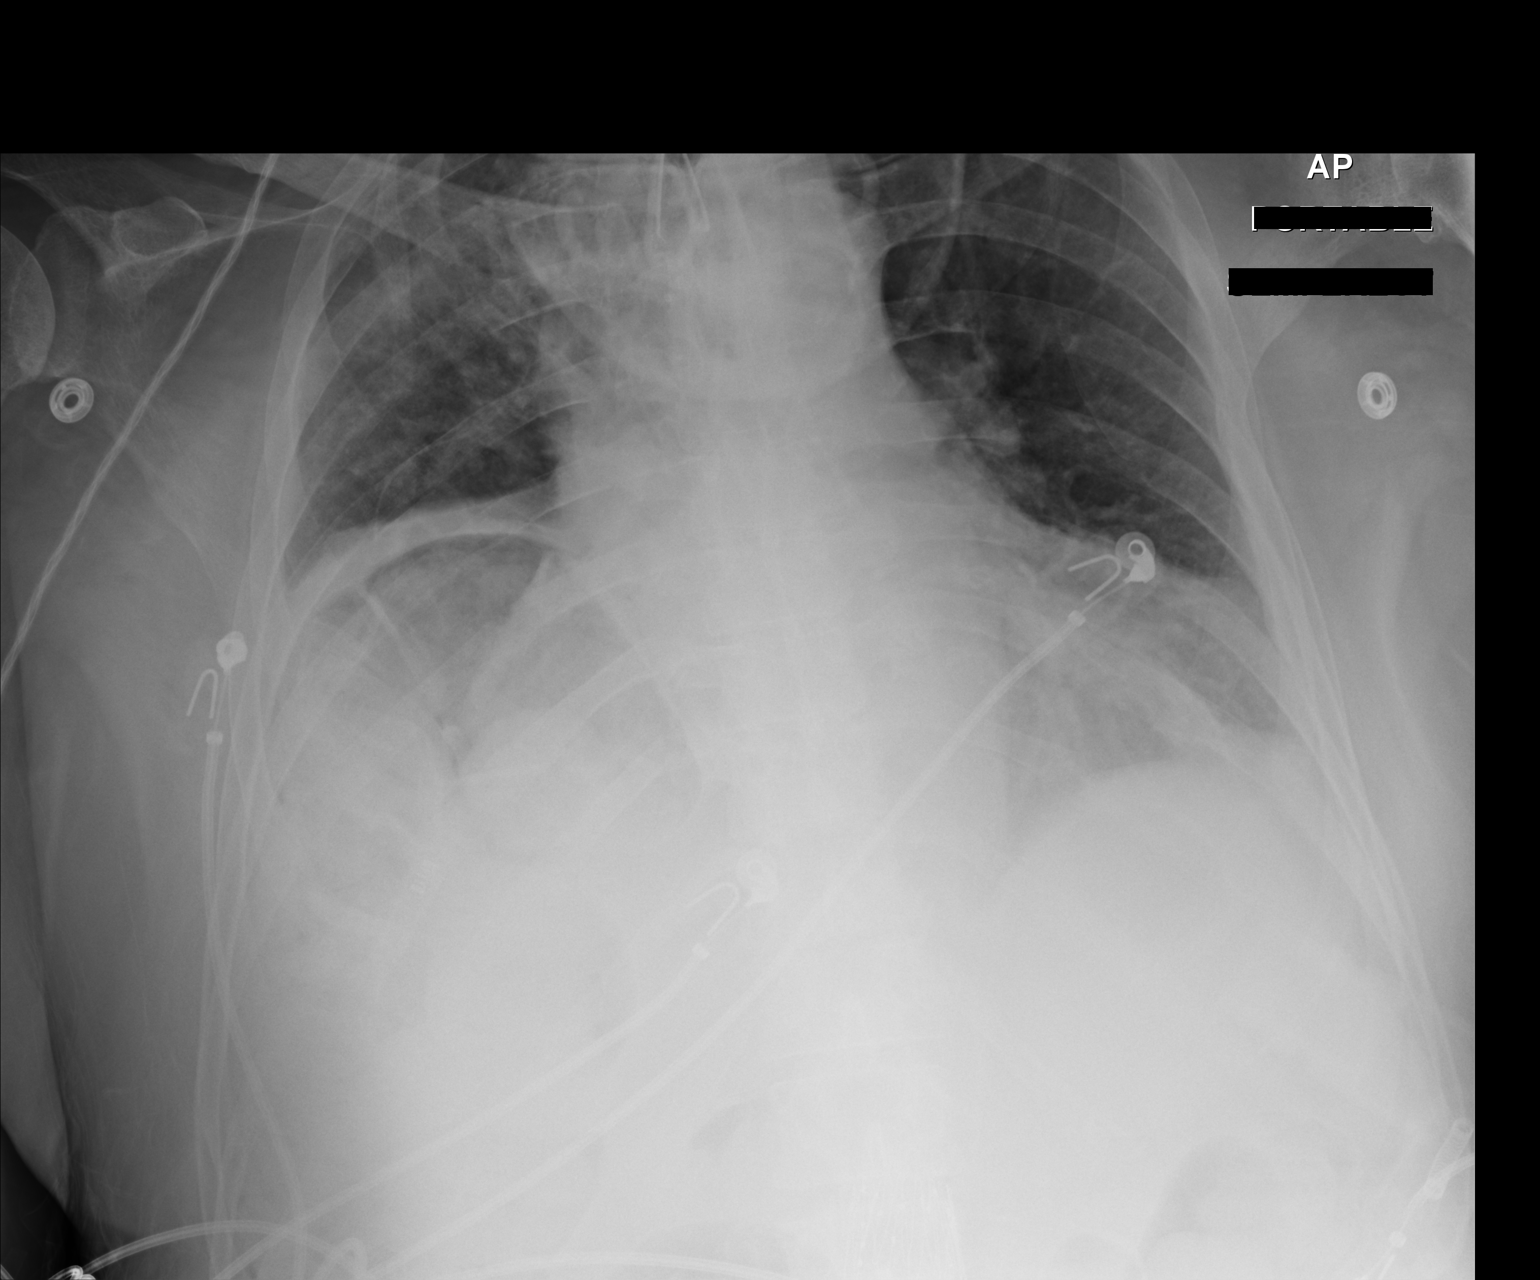

[1 of 1 positions shown; findings below may reference images not displayed]

FINDINGS: There is shallow inspiration with mild eventration of the right
hemidiaphragm. Bibasilar densities again noted which may represent
atelectasis or infiltrate. A small right pleural effusion is noted.
There is no pneumothorax. There is silhouetting of the cardiac
borders. No acute osseous pathology.
IMPRESSION: Shallow inspiration with bibasilar atelectasis versus infiltrate.
Small right pleural effusion.

## 2017-12-12 IMAGING — RF DG SWALLOWING FUNCTION - NRPT [facility]
1 series · 18 of 24 positions shown · non-contrast
Comparison: none

[Series 1: run · 15 acquisitions, 18 frames shown]
[im 1/15]
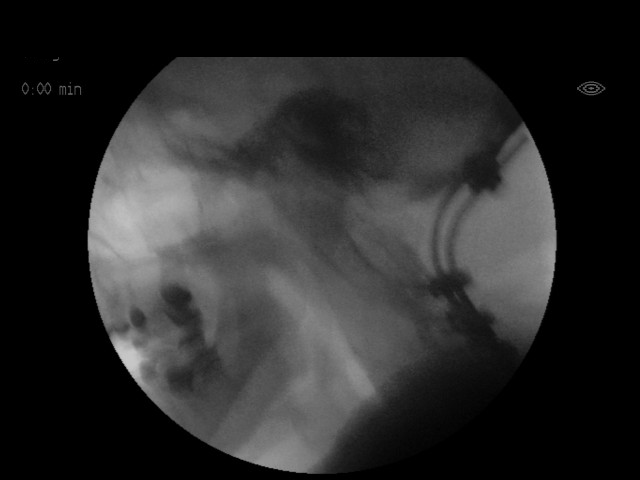
[im 2/15]
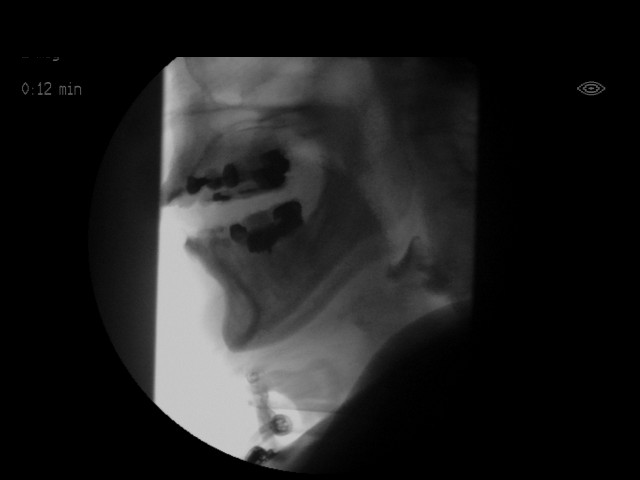
[im 3/15]
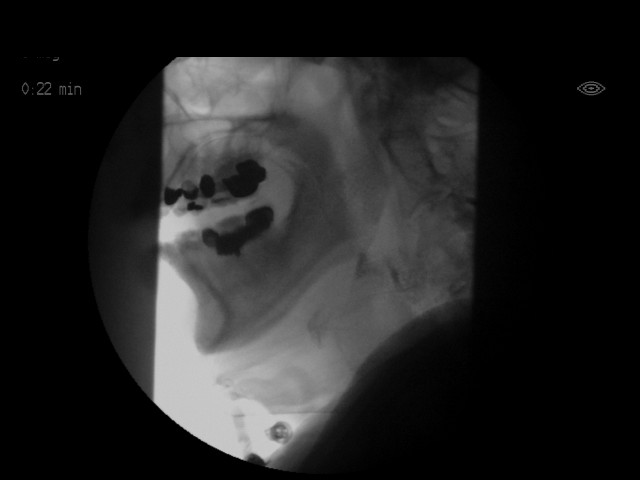
[im 3/15]
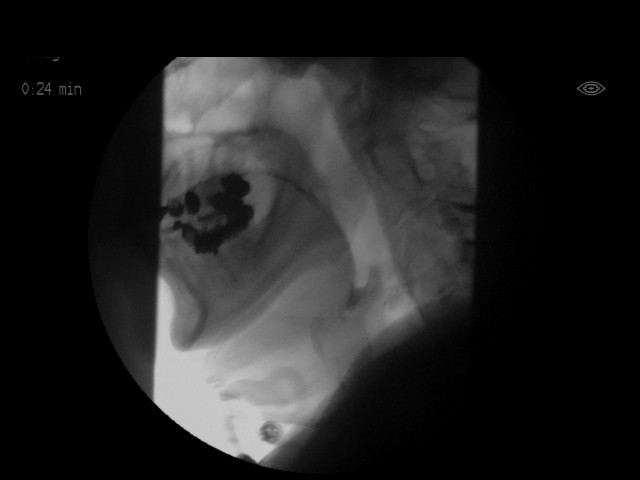
[im 4/15]
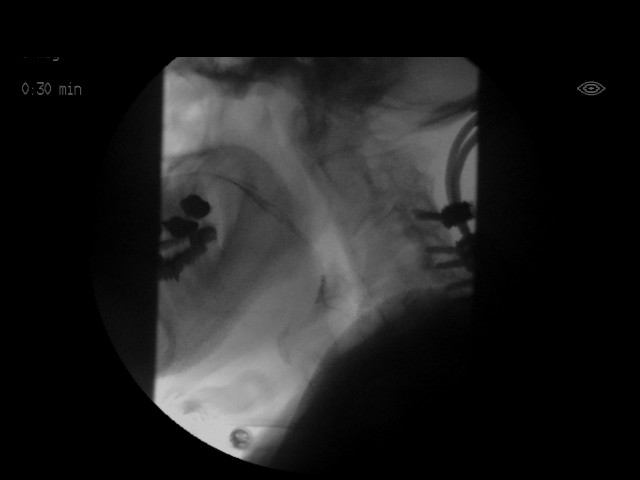
[im 5/15]
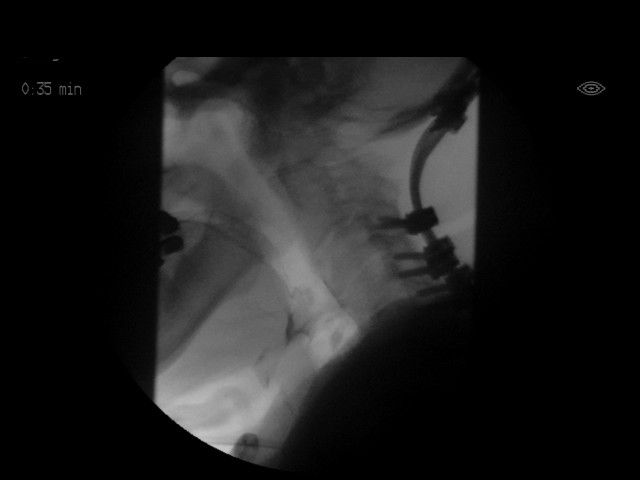
[im 6/15]
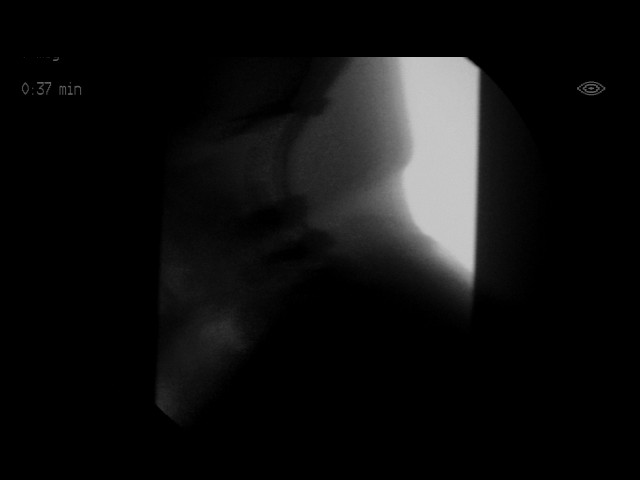
[im 7/15]
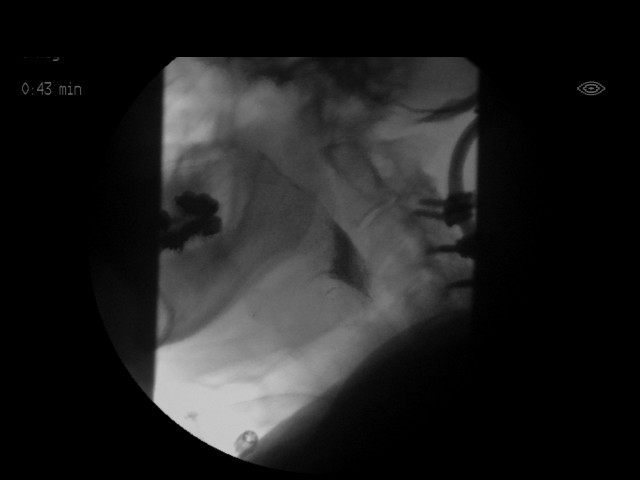
[im 8/15]
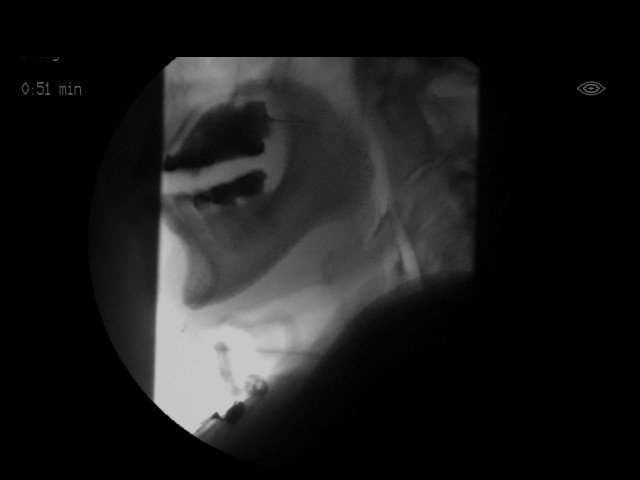
[im 8/15]
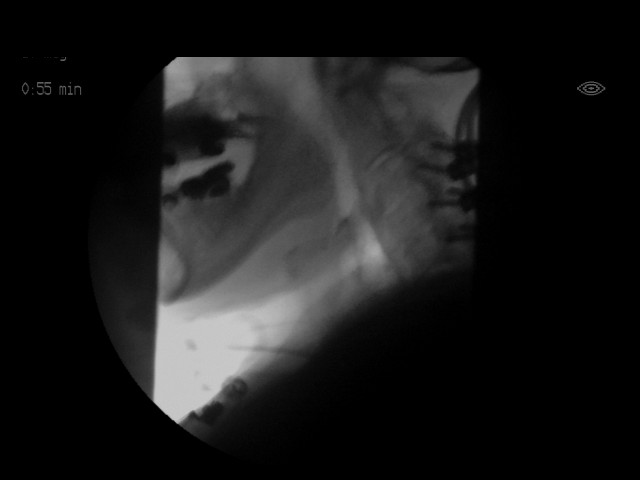
[im 10/15]
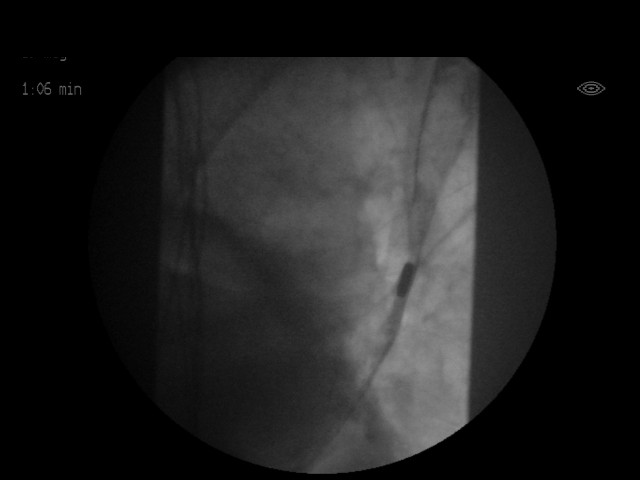
[im 10/15]
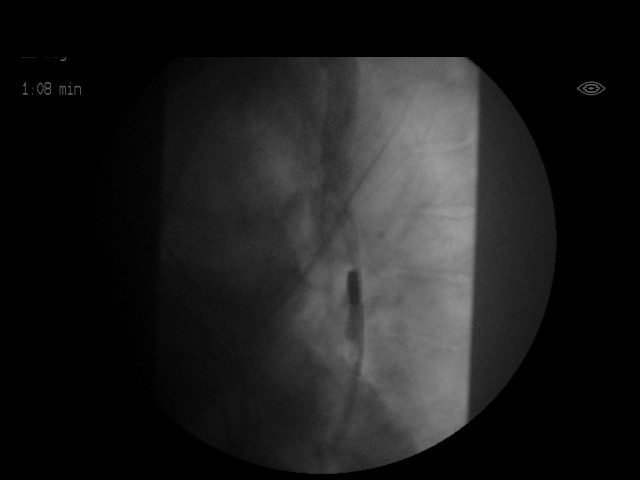
[im 11/15]
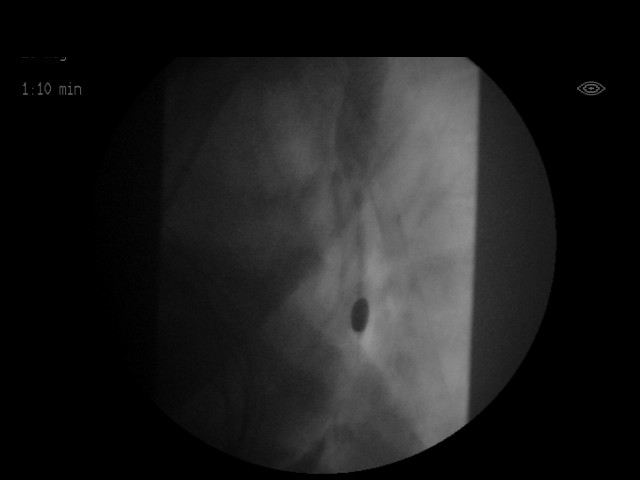
[im 12/15]
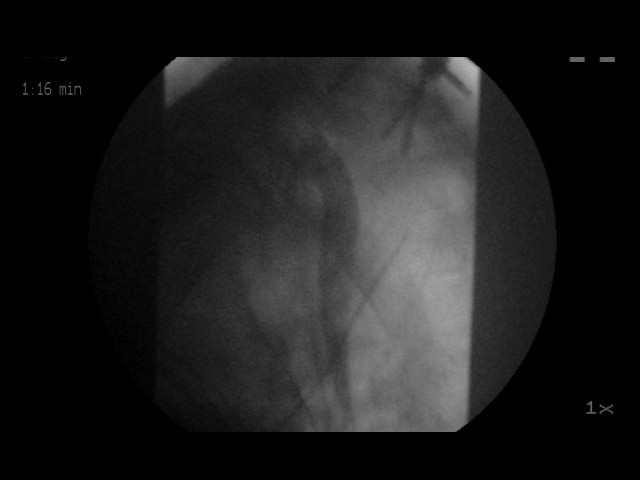
[im 13/15]
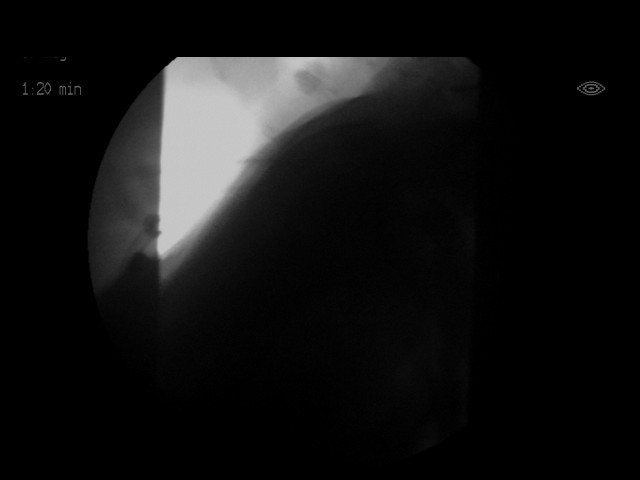
[im 13/15]
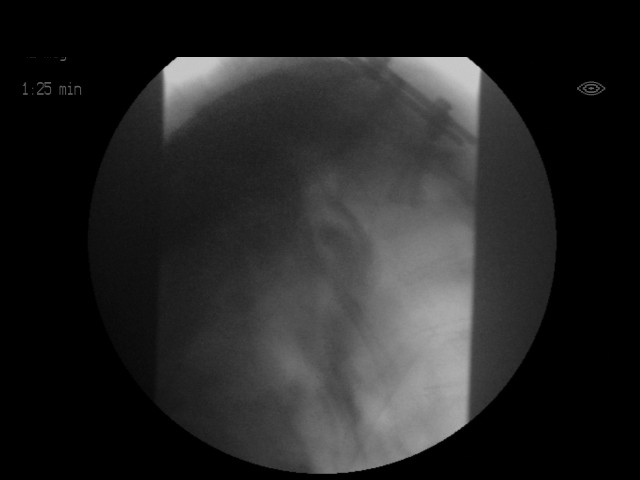
[im 15/15]
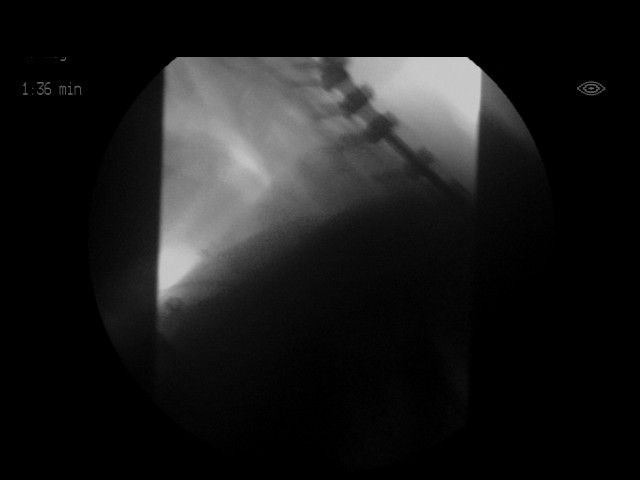
[im 15/15]
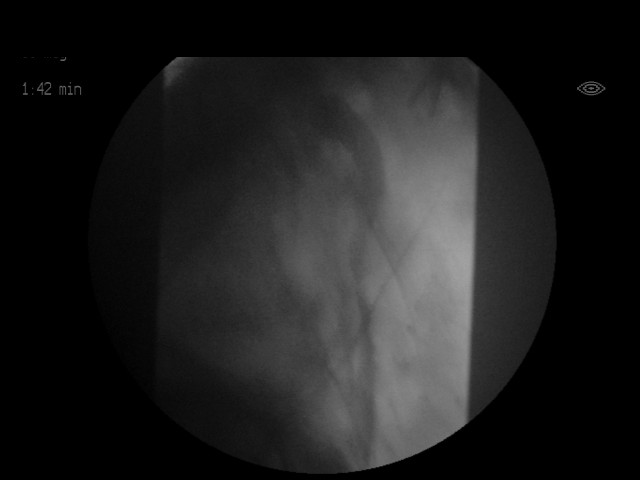

[18 of 24 positions shown; findings below may reference images not displayed]

FLUOROSCOPY FOR SWALLOWING FUNCTION STUDY:
Fluoroscopy was provided for swallowing function study, which was administered by a speech pathologist.  Final results and recommendations from this study are contained within the speech pathology report.

## 2017-12-15 ENCOUNTER — Encounter: Payer: Self-pay | Admitting: Podiatry

## 2017-12-15 ENCOUNTER — Ambulatory Visit (INDEPENDENT_AMBULATORY_CARE_PROVIDER_SITE_OTHER): Payer: Medicare Other | Admitting: Podiatry

## 2017-12-15 DIAGNOSIS — M129 Arthropathy, unspecified: Secondary | ICD-10-CM

## 2017-12-15 DIAGNOSIS — M79676 Pain in unspecified toe(s): Secondary | ICD-10-CM

## 2017-12-15 DIAGNOSIS — M2032 Hallux varus (acquired), left foot: Secondary | ICD-10-CM

## 2017-12-15 DIAGNOSIS — B351 Tinea unguium: Secondary | ICD-10-CM | POA: Diagnosis not present

## 2017-12-15 NOTE — Progress Notes (Signed)
Complaint:  Visit Type: Patient returns to my office for continued preventative foot care services. Complaint: Patient states" my nails have grown long and thick and become painful to walk and wear shoes" Patient has had previous surgery by Dr. Al CorpusHyatt.. The patient presents for preventative foot care services. No changes to ROS  Podiatric Exam: Vascular: dorsalis pedis  Are weakly  palpable bilateral.  Posterior tibial pulses are not palpable  B/L Capillary return is immediate. Temperature gradient is WNL. Skin turgor WNL  Sensorium: Normal Semmes Weinstein monofilament test. Normal tactile sensation bilaterally. Nail Exam: Pt has thick disfigured discolored nails with subungual debris noted bilateral entire nail hallux through fifth toenails Ulcer Exam: There is no evidence of ulcer or pre-ulcerative changes or infection. Orthopedic Exam: Muscle tone and strength are WNL. No limitations in general ROM. No crepitus or effusions noted. Foot type and digits show no abnormalities. Limited ROM  1st MPJ  B/l.  Hallux malleus left foot. Skin: No Porokeratosis. No infection or ulcers  Diagnosis:  Onychomycosis, , Pain in right toe, pain in left toes  Treatment & Plan Procedures and Treatment: Consent by patient was obtained for treatment procedures.   Debridement of mycotic and hypertrophic toenails, 1 through 5 bilateral and clearing of subungual debris. No ulceration, no infection noted. Patient has admitted to scratching self today causing blood at the heels  B/l.  No open laceration noted.   Return Visit-Office Procedure: Patient instructed to return to the office for a follow up visit 3 months for continued evaluation and treatment.    Chad Huffman DPM

## 2017-12-16 IMAGING — DX DG CHEST 1V PORT
1 series · 1 of 1 positions shown · non-contrast
Comparison: 04/06/2016

CLINICAL DATA: Fall at [HOSPITAL] short of breath

EXAM:
PORTABLE CHEST 1 VIEW

[chest ap]
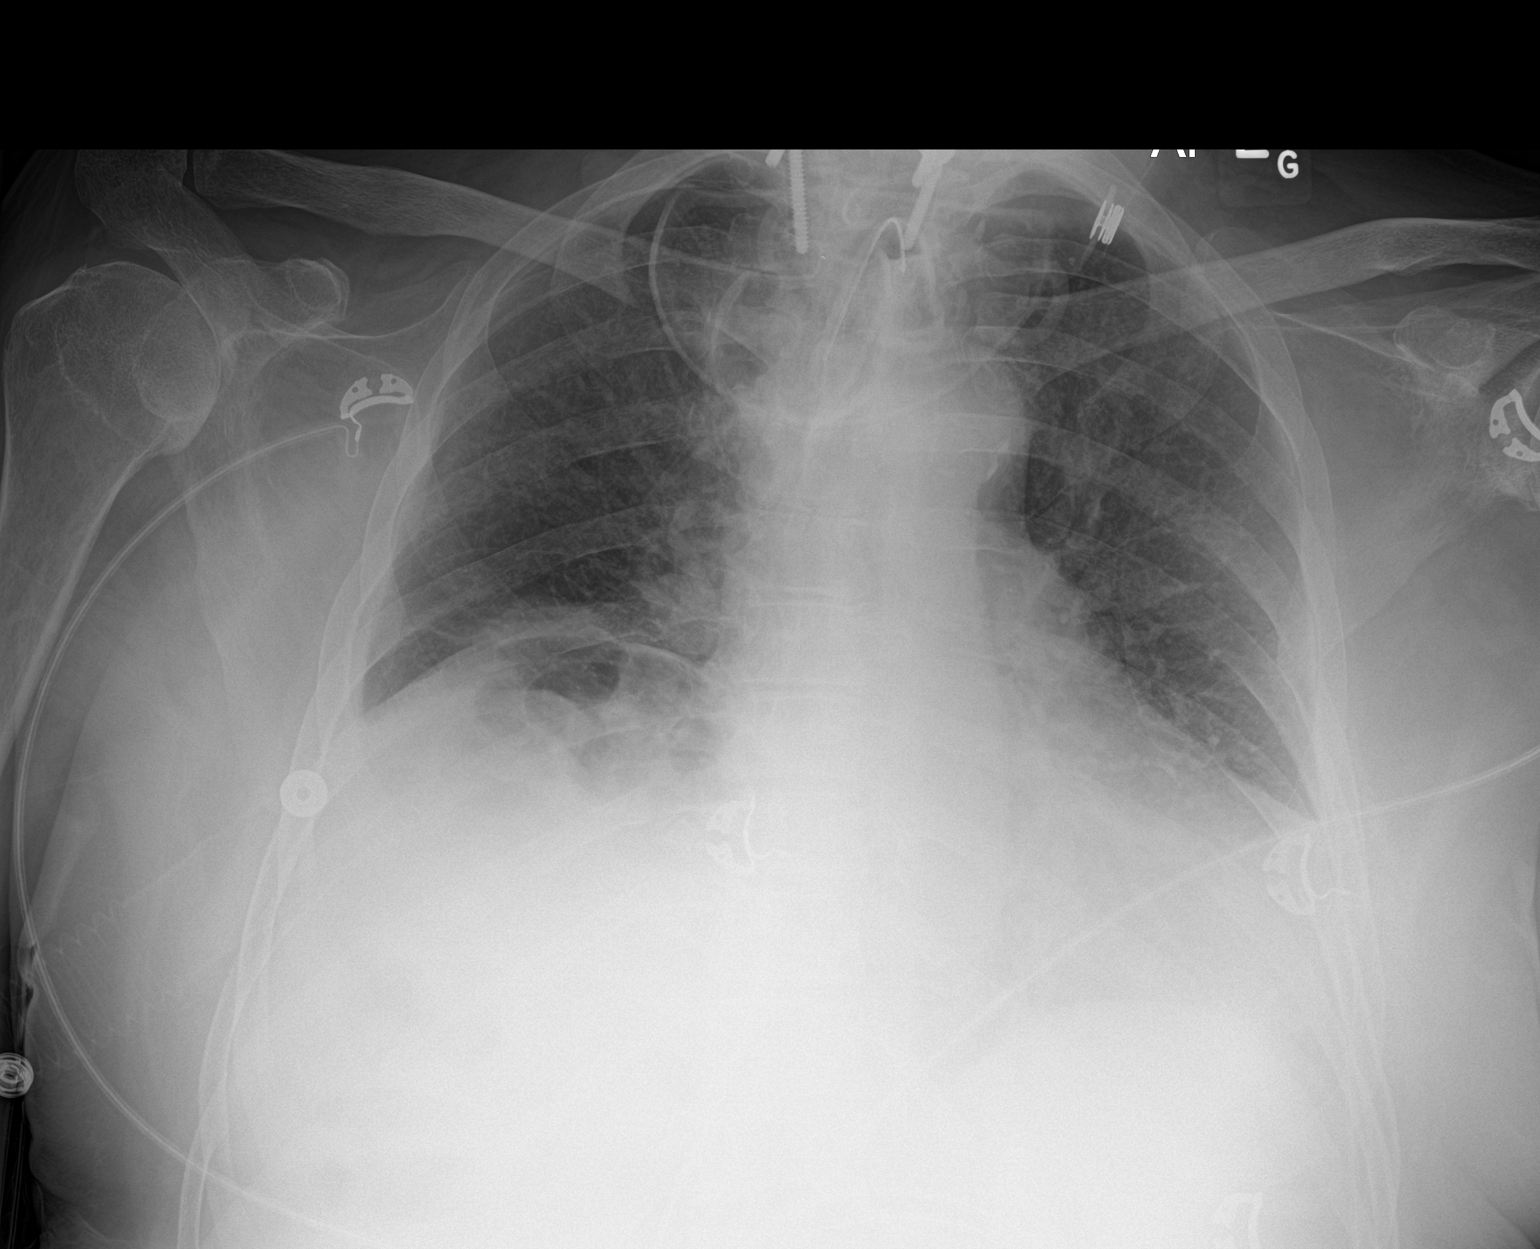

[1 of 1 positions shown; findings below may reference images not displayed]

FINDINGS: Partially visualized hardware within the cervical thoracic spine. A
tracheostomy tube is in place. Elevated right diaphragm with colonic
interposition . Hazy atelectasis or infiltrate at the right base.
Mild to moderate cardiomegaly. Atherosclerosis of the aorta. No
pneumothorax.
IMPRESSION: 1. Slightly elevated right diaphragm with: Inter positioning.
2. Possible tiny right effusion with hazy right basilar atelectasis
or infiltrate
3. Mild to moderate cardiomegaly
4. Atherosclerosis of the aorta

## 2017-12-19 ENCOUNTER — Other Ambulatory Visit: Payer: Self-pay

## 2017-12-19 ENCOUNTER — Ambulatory Visit (INDEPENDENT_AMBULATORY_CARE_PROVIDER_SITE_OTHER): Payer: Medicare Other | Admitting: Family

## 2017-12-19 ENCOUNTER — Encounter: Payer: Self-pay | Admitting: Family

## 2017-12-19 ENCOUNTER — Ambulatory Visit (HOSPITAL_COMMUNITY)
Admission: RE | Admit: 2017-12-19 | Discharge: 2017-12-19 | Disposition: A | Discharge status: Home / Self Care | Payer: Medicare Other | Source: Ambulatory Visit | Attending: Family | Admitting: Family

## 2017-12-19 ENCOUNTER — Ambulatory Visit (INDEPENDENT_AMBULATORY_CARE_PROVIDER_SITE_OTHER)
Admission: RE | Admit: 2017-12-19 | Discharge: 2017-12-19 | Disposition: A | Discharge status: Home / Self Care | Payer: Medicare Other | Source: Ambulatory Visit | Attending: Family | Admitting: Family

## 2017-12-19 VITALS — BP 120/72 | HR 85 | Temp 97.9°F | Resp 18 | Ht 68.5 in | Wt 235.0 lb

## 2017-12-19 DIAGNOSIS — I6523 Occlusion and stenosis of bilateral carotid arteries: Secondary | ICD-10-CM

## 2017-12-19 DIAGNOSIS — I714 Abdominal aortic aneurysm, without rupture, unspecified: Secondary | ICD-10-CM

## 2017-12-19 DIAGNOSIS — I10 Essential (primary) hypertension: Secondary | ICD-10-CM | POA: Insufficient documentation

## 2017-12-19 DIAGNOSIS — Z95828 Presence of other vascular implants and grafts: Secondary | ICD-10-CM | POA: Diagnosis not present

## 2017-12-19 NOTE — Progress Notes (Signed)
Vitals:   12/19/17 0857  BP: (!) 152/78  Pulse: 91  Resp: 18  Temp: 97.9 F (36.6 C)  TempSrc: Oral  SpO2: 90%  Weight: 235 lb (106.6 kg)  Height: 5' 8.5" (1.74 m)

## 2017-12-19 NOTE — Patient Instructions (Signed)
Before your next abdominal ultrasound:  Take two Extra-Strength Gas-X capsules at bedtime the night before the test. Take another two Extra-Strength Gas-X capsules 3 hours before the test.  Avoid gas forming foods and beverages the day before the test.      troke Prevention Some health problems and behaviors may make it more likely for you to have a stroke. Below are ways to lessen your risk of having a stroke.  Be active for at least 30 minutes on most or all days.  Do not smoke. Try not to be around others who smoke.  Do not drink too much alcohol. ? Do not have more than 2 drinks a day if you are a man. ? Do not have more than 1 drink a day if you are a woman and are not pregnant.  Eat healthy foods, such as fruits and vegetables. If you were put on a specific diet, follow the diet as told.  Keep your cholesterol levels under control through diet and medicines. Look for foods that are low in saturated fat, trans fat, cholesterol, and are high in fiber.  If you have diabetes, follow all diet plans and take your medicine as told.  Ask your doctor if you need treatment to lower your blood pressure. If you have high blood pressure (hypertension), follow all diet plans and take your medicine as told by your doctor.  If you are 6518-70 years old, have your blood pressure checked every 3-5 years. If you are age 70 or older, have your blood pressure checked every year.  Keep a healthy weight. Eat foods that are low in calories, salt, saturated fat, trans fat, and cholesterol.  Do not take drugs.  Avoid birth control pills, if this applies. Talk to your doctor about the risks of taking birth control pills.  Talk to your doctor if you have sleep problems (sleep apnea).  Take all medicine as told by your doctor. ? You may be told to take aspirin or blood thinner medicine. Take this medicine as told by your doctor. ? Understand your medicine instructions.  Make sure any other conditions  you have are being taken care of.  Get help right away if:  You suddenly lose feeling (you feel numb) or have weakness in your face, arm, or leg.  Your face or eyelid hangs down to one side.  You suddenly feel confused.  You have trouble talking (aphasia) or understanding what people are saying.  You suddenly have trouble seeing in one or both eyes.  You suddenly have trouble walking.  You are dizzy.  You lose your balance or your movements are clumsy (uncoordinated).  You suddenly have a very bad headache and you do not know the cause.  You have new chest pain.  Your heart feels like it is fluttering or skipping a beat (irregular heartbeat). Do not wait to see if the symptoms above go away. Get help right away. Call your local emergency services (911 in [place] Do not drive yourself to the hospital. This information is not intended to replace advice given to you by your health care provider. Make sure you discuss any questions you have with your health care provider. Document Released: 12/07/2011 Document Revised: 11/13/2015 Document Reviewed: 12/08/2012 Elsevier Interactive Patient Education  [employer]2018 Else[employer] Inc.

## 2017-12-19 NOTE — Progress Notes (Signed)
[facility] [place]  CC: Follow up s/p Endovascular Repair of Abdominal Aortic Aneurysm and extracranial carotid artery stenosis    History of Present Illness  Chad Huffman is a 72 y.o. (1946-02-05) male who is status post stent repair of a AAA in 2009 by Dr. Myra Gianotti. Patient is also followed for carotid artery stenosis.  He was undergoing evaluation for carotid stenosis because he was having difficulty falling.  He ultimately underwent angiography in March 2018.  Which identified 60% right carotid stenosis, therefore no intervention was performed.  Previously he has undergone angiography to evaluate his left carotid artery which was 70-80 percent in-stent stenosis.  Anatomically, this would be a difficult stent, however with his neck fusion he is not candidate for surgical endarterectomy.  The patient's falling has been attributed to hypotension.  His medication has been adjusted.  Unfortunately, the patient fell in May 2018 and he feels that he may have not something loose within his back and saw his spine surgeon at [name] re this.   Dr. Myra Gianotti last evaluated pt on 11-22-16. At that time abdominal duplex continued to show a decrease in the size of his aneurysm sac.  He remained asymptomatic re his carotid stenosis.    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 He denies history of stroke or TIA. He states tingling and numbness in his right arm are related to position, states this has been tested and is related to nerves in his shoulder and c-spine.   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 He denies any new back pain, denies  abdominal pain. He denies legs weakness, uses a walker for stability.  He denies non healing wounds in feet/legs.  He has CHF and COPD. He reports using 3L O2/[place] at night.   Pt sleeps in a recliner, and states some of the swelling in his lower legs decreases by morning.  He states that he has never taken antibiotics for a skin infection in his lower legs.    Diabetic: No Tobacco use: former smoker, quit in 2008, smoked x 30 years  Pt meds include: Statin :No, states his cholesterol has been good. Betablocker: yes ASA: Yes Other anticoagulants/antiplatelets: Plavix   Past Medical History:  Diagnosis Date  . Antalgic gait   . Carotid artery occlusion   . CHF (congestive heart failure) (HCC)   . Chronic neck and back pain   . Complication of anesthesia    pt has had cervical fusion-limited neck flextion  . COPD (chronic obstructive pulmonary disease) (HCC)   . Depression   . DJD (degenerative joint disease)    Right shoulder  . Hypertension   . Lumbago   . Peripheral neuropathy    Past Surgical History:  Procedure Laterality Date  . ABDOMINAL AORTIC ANEURYSM REPAIR  2009   Endovascular AAA repair  . ABDOMINAL AORTIC ANEURYSM REPAIR    . CAROTID ANGIOGRAPHY  09/09/2016   Procedure: Carotid Angiography;  Surgeon: Nada Libman, MD;  Location: [facility] INVASIVE CV LAB;  Service: Cardiovascular;;  rt. carotid  . CARPAL TUNNEL RELEASE  bilateral CTS  . ESOPHAGOGASTRODUODENOSCOPY (EGD) WITH PROPOFOL N/A 09/28/2016   Procedure: ESOPHAGOGASTRODUODENOSCOPY (EGD) WITH PROPOFOL;  Surgeon: Midge Minium, MD;  Location: [facility] ENDOSCOPY;  Service: Endoscopy;  Laterality: N/A;  . HAMMER TOE SURGERY  12/10/2011   Procedure: HAMMER TOE CORRECTION;  Surgeon: Ernestene Kiel, DPM;  Location: [facility];  Service: Podiatry;  Laterality: Right;  Right hammertoe repair second right with 2.5x42 orthopro screw   . IR GENERIC HISTORICAL  04/05/2016   IR GASTROSTOMY TUBE MOD SED  04/05/2016 [facility]  . IR GENERIC HISTORICAL  05/12/2016   IR CM INJ ANY COLONIC TUBE W/FLUORO 05/12/2016 Malachy Moan, MD [facility]  . IR GENERIC HISTORICAL  05/26/2016   IR CM INJ ANY COLONIC TUBE W/FLUORO 05/26/2016 [facility]  . IR GENERIC HISTORICAL  05/28/2016   IR REPLC GASTRO/COLONIC TUBE PERCUT W/FLUORO 05/28/2016 Brayton El, PA-C [facility]  . IR GENERIC HISTORICAL  07/02/2016   IR GASTROSTOMY TUBE REMOVAL 07/02/2016 Brayton El, PA-C [facility]  . METATARSAL OSTEOTOMY  12/10/2011   Procedure: METATARSAL OSTEOTOMY;  Surgeon: Ernestene Kiel, DPM;  Location: [facility];  Service: Podiatry;  Laterality: Right;  Right Keller arthroplasty with size 5 silicone implant right great toe; second metatarsal osteotomy with 2.0x14 screw  . NECK SURGERY    . PERIPHERAL VASCULAR CATHETERIZATION N/A 02/11/2015   Procedure: Carotid Angiography;  Surgeon: Nada Libman, MD;  Location: [facility] INVASIVE CV LAB;  Service: Cardiovascular;  Laterality: N/A;  . PILONIDAL CYST / SINUS EXCISION    . Rotary cuff    . SPINE SURGERY  09/15/11   Scar tissue removed- back  . SPINE SURGERY  09/15/11   Scar tissue removed- back  . TONSILLECTOMY    . TRACHEOSTOMY TUBE PLACEMENT N/A 03/03/2016   Procedure: TRACHEOSTOMY;  Surgeon: Geanie Logan, MD;  Location: [facility] ORS;  Service: ENT;  Laterality: N/A;   Social History Social History   Tobacco Use  . Smoking status: Former Smoker    Packs/day: 1.00    Years: 30.00    Pack years: 30.00    Types: Cigarettes    Last attempt to quit: 08/30/2006    Years since quitting: 11.3  . Smokeless tobacco: Never Used  Substance Use Topics  . Alcohol use: Yes    Alcohol/week: 1.2 oz    Types: 2 Cans of beer per week    Comment: occasional use  . Drug use: No   Family History Family History  Problem Relation Age of Onset  . Stomach cancer Mother   . Hypertension Brother   . Heart attack Father    Current Outpatient Medications on File  Prior to Visit  Medication Sig Dispense Refill  . amLODipine (NORVASC) 5 MG tablet Take 5 mg by mouth daily.    Marland Kitchen aspirin EC 81 MG tablet Take 81 mg by mouth daily.    . baclofen (LIORESAL) 10 MG tablet Take 10 mg by mouth 2 (two) times daily as needed for muscle spasms.     . clopidogrel (PLAVIX) 75 MG tablet TAKE 1 TABLET BY MOUTH EVERY DAY 30 tablet 9  . Krill Oil Omega-3 500 MG CAPS Take 1 tablet by mouth daily.    Marland Kitchen levothyroxine (SYNTHROID, LEVOTHROID) 200 MCG tablet Take 200 mcg by mouth daily before breakfast.    . Multiple Vitamin (MULTIVITAMIN WITH MINERALS) TABS tablet Take 1 tablet by mouth daily.    . OXYGEN 4 L/min by Intratracheal route as needed (for shortness of breath).     Marland Kitchen  sertraline (ZOLOFT) 100 MG tablet Take 1 tablet (100 mg total) by mouth daily.    . SYMBICORT 160-4.5 MCG/ACT inhaler TAKE 2 PUFFS BY MOUTH TWICE A DAY 10.2 Inhaler 5  . tiotropium (SPIRIVA) 18 MCG inhalation capsule Place 1 capsule (18 mcg total) into inhaler and inhale daily. 30 capsule 12  . furosemide (LASIX) 20 MG tablet Take 1 tablet (20 mg total) by mouth daily. 30 tablet 0  . ipratropium-albuterol (DUONEB) 0.5-2.5 (3) MG/3ML SOLN Take 3 mLs by nebulization every 6 (six) hours as needed. Dx:J44.9 (Patient not taking: Reported on 12/19/2017) 360 mL 5  . metoprolol tartrate (LOPRESSOR) 25 MG tablet Take 1 tablet (25 mg total) by mouth 2 (two) times daily. 60 tablet 6   No current facility-administered medications on file prior to visit.    Allergies  Allergen Reactions  . Ampicillin Swelling and Other (See Comments)    Unable to obtain enough information to answer additional questions about this medication.  Has patient had a PCN reaction causing immediate rash, facial/tongue/throat swelling, SOB or lightheadedness with hypotension: Yes Has patient had a PCN reaction causing severe rash involving mucus membranes or skin necrosis: No Has patient had a PCN reaction that required hospitalization No Has  patient had a PCN reaction occurring within the last 10 years: Yes If all of the above answers are "NO", then may pr  . Nortriptyline Other (See Comments)    Sleep walking  . Brimonidine Other (See Comments)    Unknown reaction per MAR   . Cyclobenzaprine Other (See Comments)    Hallucinations  . Tizanidine Other (See Comments)    Hallucinations  . Gabapentin Rash     ROS: See HPI for pertinent positives and negatives.  Physical Examination  Vitals:   12/19/17 0857 12/19/17 0903  BP: (!) 152/78 120/72  Pulse: 91 85  Resp: 18   Temp: 97.9 F (36.6 C)   TempSrc: Oral   SpO2: 90%   Weight: 235 lb (106.6 kg)   Height: 5' 8.5" (1.74 m)    Body mass index is 35.21 kg/m.  General: A&O x 3, WD, obese male. Gait: slow, steady, using walker. HENT: Large neck that is stiffly held forward.  Eyes: PERRLA Pulmonary: Sym exp, respirations are somewhat labored at rest with some use of accessor muscles, fair air movement in all fields, CTAB, no rales, rhonchi, or wheezing. Cardiac: Regular rhythm and rate, no murmur appreciated  Vascular: Vessel Right Left  Radial Not Palpable 2+Palpable  Brachial 2+Palpable 2+Palpable  Carotid  without bruit  without bruit  Aorta Not palpable N/A  Femoral 2+Palpable 2+Palpable  Popliteal Not palpable Not palpable  PT Faintly Palpable Not Palpable  DP Not Palpable Not Palpable   Gastrointestinal: soft, NTND, -G/R, - HSM, - palpable masses, - CVAT B. Scar LUQ where feeding tube was. Musculoskeletal: M/S 5/5 throughout, extremities without ischemic changes. Neck is tilted forward and pt is unable to rotate neck. Enlarged joints of hands and toes.  Skin: + for seborrheic dermatitis of scalp and face, no ulcers. Pitting and non pitting edema in both lower legs and feet, 2+ right, 1+ left. Orange peel appearance with mild erythema at anterior right lower leg.   Neurologic: Pain and light touch intact in extremities except diminished sensation to light  touch in right leg and foot, Motor exam as listed above. Psychiatric: Normal thought content, mood appropriate for clinical situation.     DATA  Carotid Duplex (12/19/17): Right ICA: 60-79% stenosis Left ICA:  1-39% stenosis Unable to evaluate vertebral arteries and subclavian arteries due patient's lack of neck extension and and mobility.    EVAR Duplex   Previous (Date: 11-22-16) AAA sac size: 3.65 cm, limited visualization due to overlying bowel gas.   Current (Date: 12-19-17)  AAA sac size: 4.06 cm; Right CIA: not visualized; Left CIA: not visualized.   no endoleak detected  Proximal and distal aorta no visualized, limited visualization of mid aorta.   Serum creatinine result on 10-10-17 was 1.08.   Medical Decision Making  Chad Huffman is a 72 y.o. male who presents s/p EVAR (Date: 2009).  Pt is symptomatic with increase in sac size to 4.06 cm compared to 3.65 cm a year ago, based on very limited visualization  I discussed with the patient the importance of surveillance of the endograft.  I discussed with Dr. Myra GianottiBrabham 3.56 cm sac size a year ago compared to 4.06 cm today, both exams based on limited visualization due to overlying bowel gas and obesity, will check with duplex in 6 months.  The next endograft duplex will be scheduled for 6 months, will also check carotid duplex in 6 months..  The patient will follow up with us in 6 months with these studies.  I emphasized the importance of maximal medical management including strict control of blood pressure, blood glucose, and lipid levels, antiplatelet agents, obtaining regular exercise, and cessation of smoking.   Thank you for allowing us to participate in this patient's care.  Charisse MarchSuzanne Chaysen Tillman, RN, MSN, FNP-C [facility] [place] Office: [phone]  Clinic Physician: Myra GianottiBrabham  12/19/2017, 9:08 AM

## 2017-12-21 ENCOUNTER — Ambulatory Visit: Payer: Managed Care, Other (non HMO) | Admitting: Podiatry

## 2017-12-26 ENCOUNTER — Other Ambulatory Visit: Payer: Self-pay

## 2017-12-26 DIAGNOSIS — I714 Abdominal aortic aneurysm, without rupture, unspecified: Secondary | ICD-10-CM

## 2017-12-26 DIAGNOSIS — I6523 Occlusion and stenosis of bilateral carotid arteries: Secondary | ICD-10-CM

## 2017-12-26 DIAGNOSIS — Z95828 Presence of other vascular implants and grafts: Secondary | ICD-10-CM

## 2018-01-02 ENCOUNTER — Encounter: Payer: Self-pay | Admitting: Emergency Medicine

## 2018-01-02 ENCOUNTER — Other Ambulatory Visit: Payer: Self-pay

## 2018-01-02 ENCOUNTER — Emergency Department
Admission: EM | Admit: 2018-01-02 | Discharge: 2018-01-02 | Disposition: A | Discharge status: Home / Self Care | Payer: Medicare Other | Attending: Emergency Medicine | Admitting: Emergency Medicine

## 2018-01-02 DIAGNOSIS — B379 Candidiasis, unspecified: Secondary | ICD-10-CM

## 2018-01-02 DIAGNOSIS — Z87891 Personal history of nicotine dependence: Secondary | ICD-10-CM | POA: Diagnosis not present

## 2018-01-02 DIAGNOSIS — Z79899 Other long term (current) drug therapy: Secondary | ICD-10-CM | POA: Insufficient documentation

## 2018-01-02 DIAGNOSIS — J449 Chronic obstructive pulmonary disease, unspecified: Secondary | ICD-10-CM | POA: Diagnosis not present

## 2018-01-02 DIAGNOSIS — Z7902 Long term (current) use of antithrombotics/antiplatelets: Secondary | ICD-10-CM | POA: Diagnosis not present

## 2018-01-02 DIAGNOSIS — Z7982 Long term (current) use of aspirin: Secondary | ICD-10-CM | POA: Insufficient documentation

## 2018-01-02 DIAGNOSIS — E039 Hypothyroidism, unspecified: Secondary | ICD-10-CM | POA: Insufficient documentation

## 2018-01-02 DIAGNOSIS — I5032 Chronic diastolic (congestive) heart failure: Secondary | ICD-10-CM | POA: Insufficient documentation

## 2018-01-02 DIAGNOSIS — I110 Hypertensive heart disease with heart failure: Secondary | ICD-10-CM | POA: Insufficient documentation

## 2018-01-02 DIAGNOSIS — B372 Candidiasis of skin and nail: Secondary | ICD-10-CM | POA: Diagnosis not present

## 2018-01-02 DIAGNOSIS — R21 Rash and other nonspecific skin eruption: Secondary | ICD-10-CM | POA: Diagnosis present

## 2018-01-02 MED ORDER — FLUCONAZOLE 150 MG PO TABS: ORAL_TABLET | ORAL | 0 refills | Status: DC

## 2018-01-02 MED ORDER — CLOTRIMAZOLE-BETAMETHASONE 1-0.05 % EX CREA: 1.0000 "application " | TOPICAL_CREAM | Freq: Two times a day (BID) | CUTANEOUS | 0 refills | Status: AC

## 2018-01-02 NOTE — Discharge Instructions (Addendum)
Follow-up with your regular doctor within 1 week.  Take medication as prescribed.  Apply the cream twice a day for 7 days.

## 2018-01-02 NOTE — ED Notes (Signed)
See triage note  Presents with redness to neck   States he also has noticed some foul smelling odor for several days    No fever at home or on arrival

## 2018-01-02 NOTE — ED Triage Notes (Signed)
Says his old trach is possibly infected.

## 2018-01-02 NOTE — ED Provider Notes (Signed)
[facility] Emergency Department Provider Note  ____________________________________________   First MD Initiated Contact with Patient 01/02/18 1224     (approximate)  I have reviewed the triage vital signs and the nursing notes.   HISTORY  Chief Complaint Rash    HPI Chad Huffman is a 72 y.o. male resents emergency department complaining of a rash with some drainage on the neck area near his former trach incision.  He states it has an odor.  He also has the same problem in his groin area.  He denies any fever or chills.  Past Medical History:  Diagnosis Date  . Antalgic gait   . Carotid artery occlusion   . CHF (congestive heart failure) (HCC)   . Chronic neck and back pain   . Complication of anesthesia    pt has had cervical fusion-limited neck flextion  . COPD (chronic obstructive pulmonary disease) (HCC)   . Depression   . DJD (degenerative joint disease)    Right shoulder  . Hypertension   . Lumbago   . Peripheral neuropathy     Patient Active Problem List   Diagnosis Date Noted  . Respiratory failure (HCC) 10/04/2017  . PNA (pneumonia) 09/09/2017  . Adjustment disorder with mixed anxiety and depressed mood   . Hyponatremia 06/24/2017  . Hematemesis without nausea   . Pressure injury of skin 09/14/2016  . Weakness 09/13/2016  . UTI (urinary tract infection) 09/13/2016  . Chronic diastolic CHF (congestive heart failure) (HCC) 09/13/2016  . COPD (chronic obstructive pulmonary disease) (HCC) 09/13/2016  . Carotid stenosis 09/09/2016  . Carotid artery stenosis 08/23/2016  . Pancytopenia (HCC) 08/23/2016  . Hyperkalemia 08/23/2016  . Leukocytosis 07/27/2016  . Thrombocytopenia (HCC) 07/27/2016  . Anemia 07/27/2016  . 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 Dysphagia, pharyngoesophageal phase 05/18/2016  . GERD without esophagitis 05/09/2016  . Bilateral lower extremity edema  05/09/2016  . Slow transit constipation 05/09/2016  . Depression with anxiety 05/09/2016  . Respiratory failure with hypoxia and hypercapnia (HCC) 07/22/2015  . Cervical vertebral fusion   . Depression   . Chronic neck and back pain   . COPD exacerbation (HCC)   . Congestive dilated cardiomyopathy (HCC)   . Arthritis of right hip 08/04/2014  . Essential hypertension 08/04/2014  . Hypothyroidism 08/04/2014  . Syncope 08/04/2014  . Fall 08/04/2014    Past Surgical History:  Procedure Laterality Date  . ABDOMINAL AORTIC ANEURYSM REPAIR  2009   Endovascular AAA repair  . ABDOMINAL AORTIC ANEURYSM REPAIR    . CAROTID ANGIOGRAPHY  09/09/2016   Procedure: Carotid Angiography;  Surgeon: Nada Libman, MD;  Location: [facility] INVASIVE CV LAB;  Service: Cardiovascular;;  rt. carotid  . CARPAL TUNNEL RELEASE     bilateral CTS  . ESOPHAGOGASTRODUODENOSCOPY (EGD) WITH PROPOFOL N/A 09/28/2016   Procedure: ESOPHAGOGASTRODUODENOSCOPY (EGD) WITH PROPOFOL;  Surgeon: Midge Minium, MD;  Location: [facility] ENDOSCOPY;  Service: Endoscopy;  Laterality: N/A;  . HAMMER TOE SURGERY  12/10/2011   Procedure: HAMMER TOE CORRECTION;  Surgeon: Ernestene Kiel, DPM;  Location: [facility];  Service: Podiatry;  Laterality: Right;  Right hammertoe repair second right with 2.5x42 orthopro screw   . IR GENERIC HISTORICAL  04/05/2016   IR GASTROSTOMY TUBE MOD SED 04/05/2016 [facility]  . IR GENERIC HISTORICAL  05/12/2016   IR CM INJ ANY COLONIC TUBE W/FLUORO 05/12/2016 Malachy Moan, MD [facility]  . IR GENERIC HISTORICAL  05/26/2016  IR CM INJ ANY COLONIC TUBE W/FLUORO 05/26/2016 [facility]  . IR GENERIC HISTORICAL  05/28/2016   IR REPLC GASTRO/COLONIC TUBE PERCUT W/FLUORO 05/28/2016 Brayton ElKevin Bruning, PA-C [facility]  . IR GENERIC HISTORICAL  07/02/2016   IR GASTROSTOMY TUBE REMOVAL 07/02/2016 Brayton ElKevin Bruning, PA-C [facility]  . METATARSAL OSTEOTOMY  12/10/2011   Procedure: METATARSAL OSTEOTOMY;  Surgeon:  Ernestene KielMax Hyatt, DPM;  Location: [facility];  Service: Podiatry;  Laterality: Right;  Right Keller arthroplasty with size 5 silicone implant right great toe; second metatarsal osteotomy with 2.0x14 screw  . NECK SURGERY    . PERIPHERAL VASCULAR CATHETERIZATION N/A 02/11/2015   Procedure: Carotid Angiography;  Surgeon: Nada LibmanVance W Brabham, MD;  [facility];  Service: Cardiovascular;  Laterality: N/A;  . PILONIDAL CYST / SINUS EXCISION    . Rotary cuff    . SPINE SURGERY  09/15/11   Scar tissue removed- back  . SPINE SURGERY  09/15/11   Scar tissue removed- back  . TONSILLECTOMY    . TRACHEOSTOMY TUBE PLACEMENT N/A 03/03/2016   Procedure: TRACHEOSTOMY;  Surgeon: Geanie LoganPaul Bennett, MD;  Location: [facility] ORS;  Service: ENT;  Laterality: N/A;    Prior to Admission medications   Medication Sig Start Date End Date Taking? Authorizing Provider  amLODipine (NORVASC) 5 MG tablet Take 5 mg by mouth daily. 09/29/17   [provider]  aspirin EC 81 MG tablet Take 81 mg by mouth daily.    [provider]  baclofen (LIORESAL) 10 MG tablet Take 10 mg by mouth 2 (two) times daily as needed for muscle spasms.     [provider]  clopidogrel (PLAVIX) 75 MG tablet TAKE 1 TABLET BY MOUTH EVERY DAY 09/20/17   Chuck Hintickson, Christopher S, MD  clotrimazole-betamethasone (LOTRISONE) cream Apply 1 application topically 2 (two) times daily. 01/02/18   Sherrie MustacheFisher, Roselyn BeringSusan W, PA-C  fluconazole (DIFLUCAN) 150 MG tablet Take one q d for 7 days 01/02/18   Sherrie MustacheFisher, Roselyn BeringSusan W, PA-C  furosemide (LASIX) 20 MG tablet Take 1 tablet (20 mg total) by mouth daily. 09/13/17 10/24/17  Milagros LollSudini, Srikar, MD  ipratropium-albuterol (DUONEB) 0.5-2.5 (3) MG/3ML SOLN Take 3 mLs by nebulization every 6 (six) hours as needed. Dx:J44.9 Patient not taking: Reported on 12/19/2017 12/21/16   Merwyn KatosSimonds, [name] B, MD  Providence LaniusKrill Oil Omega-3 500 MG CAPS Take 1 tablet by mouth daily.    [provider]  levothyroxine (SYNTHROID,  LEVOTHROID) 200 MCG tablet Take 200 mcg by mouth daily before breakfast.    [provider]  metoprolol tartrate (LOPRESSOR) 25 MG tablet Take 1 tablet (25 mg total) by mouth 2 (two) times daily. 07/27/16   Katharina CaperVaickute, Rima, MD  Multiple Vitamin (MULTIVITAMIN WITH MINERALS) TABS tablet Take 1 tablet by mouth daily.    [provider]  OXYGEN 4 L/min by Intratracheal route as needed (for shortness of breath).     [provider]  sertraline (ZOLOFT) 100 MG tablet Take 1 tablet (100 mg total) by mouth daily. 06/27/17   Shaune Pollackhen, Qing, MD  SYMBICORT 160-4.5 MCG/ACT inhaler TAKE 2 PUFFS BY MOUTH TWICE A DAY 06/01/17   Merwyn KatosSimonds, [name] B, MD  tiotropium (SPIRIVA) 18 MCG inhalation capsule Place 1 capsule (18 mcg total) into inhaler and inhale daily. 10/11/17   Auburn BilberryPatel, Shreyang, MD    Allergies Ampicillin; Nortriptyline; Brimonidine; Cyclobenzaprine; Tizanidine; and Gabapentin  Family History  Problem Relation Age of Onset  . Stomach cancer Mother   . Hypertension Brother   .  Heart attack Father     Social History Social History   Tobacco Use  . Smoking status: Former Smoker    Packs/day: 1.00    Years: 30.00    Pack years: 30.00    Types: Cigarettes    Last attempt to quit: 08/30/2006    Years since quitting: 11.3  . Smokeless tobacco: Never Used  Substance Use Topics  . Alcohol use: Yes    Alcohol/week: 1.2 oz    Types: 2 Cans of beer per week    Comment: occasional use  . Drug use: No    Review of Systems  Constitutional: No fever/chills Eyes: No visual changes. ENT: No sore throat. Respiratory: Denies cough Genitourinary: Negative for dysuria. Musculoskeletal: Negative for back pain. Skin: Positive for rash.    ____________________________________________   PHYSICAL EXAM:  VITAL SIGNS: ED Triage Vitals  Enc Vitals Group     BP 01/02/18 1103 (!) 133/95     Pulse Rate 01/02/18 1103 100     Resp 01/02/18 1103 18     Temp 01/02/18 1103 97.9 F (36.6  C)     Temp Source 01/02/18 1103 Oral     SpO2 01/02/18 1103 93 %     Weight 01/02/18 1104 235 lb (106.6 kg)     Height 01/02/18 1126 5' 8.5" (1.74 m)     Head Circumference --      Peak Flow --      Pain Score 01/02/18 1104 0     Pain Loc --      Pain Edu? --      Excl. in GC? --     Constitutional: Alert and oriented. Well appearing and in no acute distress. Eyes: Conjunctivae are normal.  Head: Atraumatic. Nose: No congestion/rhinnorhea. Mouth/Throat: Mucous membranes are moist.   Cardiovascular: Normal rate, regular rhythm. Respiratory: Normal respiratory effort.  No retractions GU: deferred Musculoskeletal: FROM all extremities, warm and well perfused Neurologic:  Normal speech and language.  Skin:  Skin is warm, dry and intact.  The patient has a skin fold that directly touches each other underneath his neck.  This area is bright red with some exudate noted.  Typical of yeast.  Same in the groin area. Psychiatric: Mood and affect are normal. Speech and behavior are normal.  ____________________________________________   LABS (all labs ordered are listed, but only abnormal results are displayed)  Labs Reviewed - No data to display ____________________________________________   ____________________________________________  RADIOLOGY    ____________________________________________   PROCEDURES  Procedure(s) performed: No  Procedures    ____________________________________________   INITIAL IMPRESSION / ASSESSMENT AND PLAN / ED COURSE  Pertinent labs & imaging results that were available during my care of the patient were reviewed by me and considered in my medical decision making (see chart for details).  Patient is a 72 year old male presents emergency department complaining of a rash at his neck and the skin fold and in the groin area.  On physical exam the rash is bright red with a yellowish exudate.  Explained the exam findings to the patient.   Told him this is very typical of a yeast infection.  He was given a prescription for Diflucan and Lotrisone cream.  He is to follow-up with his regular doctor in 1 week for recheck.  Return emergency department worsening.  States he understands was discharged in stable condition.     As part of my medical decision making, I reviewed the following data within the electronic MEDICAL RECORD NUMBER  Nursing notes reviewed and incorporated, Old chart reviewed, Notes from prior ED visits and [place] Controlled Substance Database  ____________________________________________   FINAL CLINICAL IMPRESSION(S) / ED DIAGNOSES  Final diagnoses:  Candidiasis      NEW MEDICATIONS STARTED DURING THIS VISIT:  Discharge Medication List as of 01/02/2018 12:31 PM    START taking these medications   Details  clotrimazole-betamethasone (LOTRISONE) cream Apply 1 application topically 2 (two) times daily., Starting Mon 01/02/2018, Print    fluconazole (DIFLUCAN) 150 MG tablet Take one q d for 7 days, Print         Note:  This document was prepared using Dragon voice recognition software and may include unintentional dictation errors.    Faythe Ghee, PA-C 01/02/18 1613    Emily Filbert, MD 01/03/18 [phone]

## 2018-01-02 NOTE — ED Triage Notes (Addendum)
Pt to ed with c/o redness and wetness noted on skin in neck area.  Pt states he had a trach there about 2 years ago and area has never been infected before.  Pt denies difficulty breathing and denies difficulty swallowing. Pt reports area has foul, sweet odor and has +itching.  Also reports similar issues in groin area.

## 2018-01-11 IMAGING — XA IR GI TUBE CONTRAST INJECTION
3 series · 12 of 24 positions shown · non-contrast
Comparison: none

INDICATION: 70-year-old male with a recently placed percutaneous gastrostomy
tube. He has some pain at the tube site and presents for evaluation.
The external bumper is very snug against the skin. This was
loosened.

[Series 1: fl (-) angio · 10 acquisitions, 4 frames shown (1 of 3)]
[im 1/10]
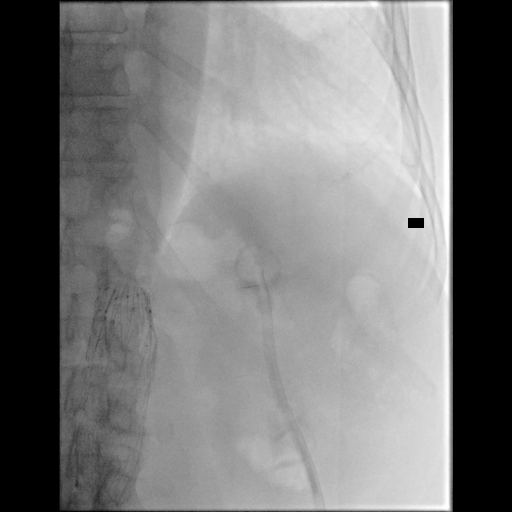
[im 2/10]
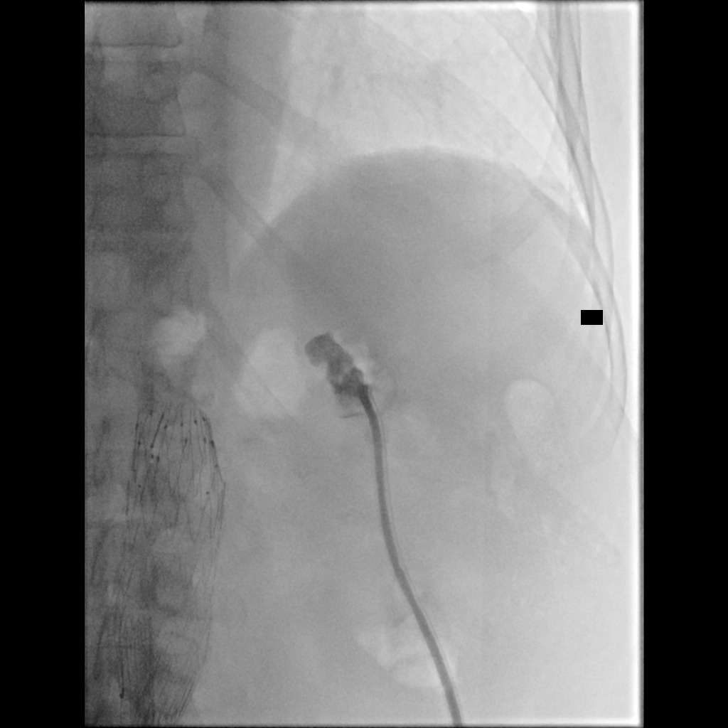
[im 5/10]
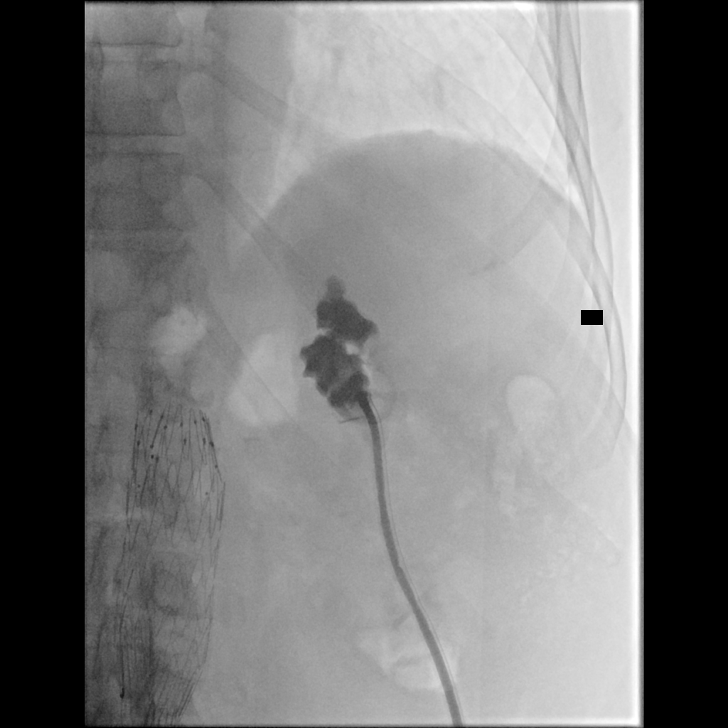
[im 7/10]
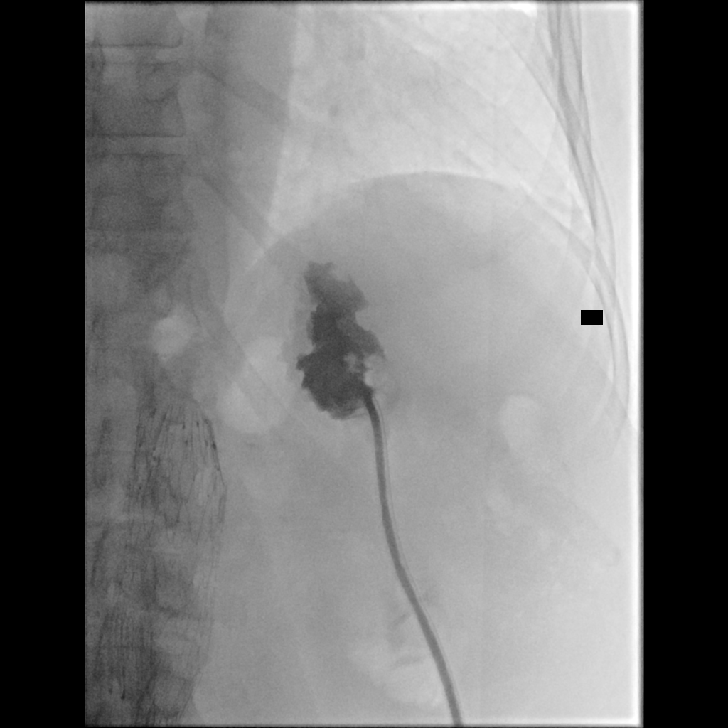

[Series 2: fl (-) angio · 4 acquisitions, 2 frames shown (2 of 3)]
[im 1/4]
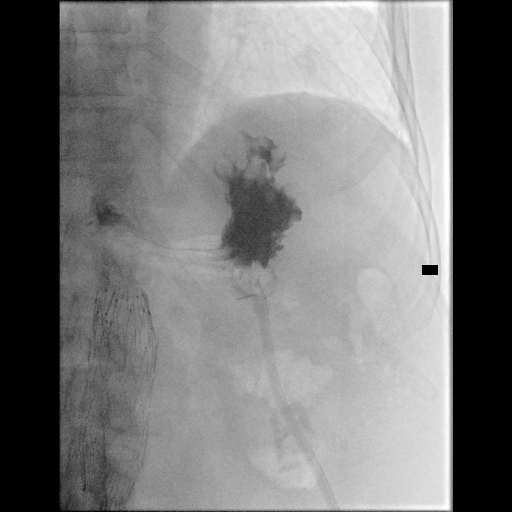
[im 2/4]
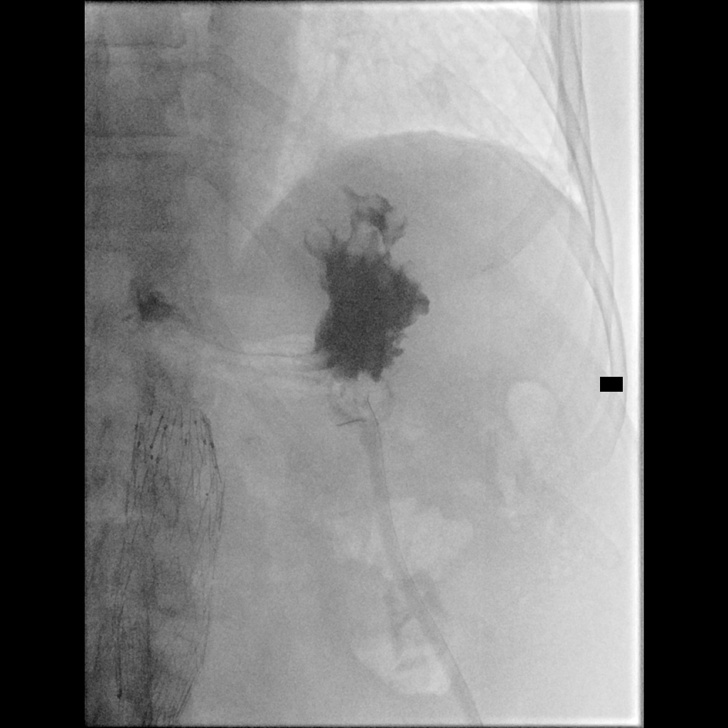

[Series 3: fl (-) angio · 11 acquisitions, 6 frames shown (3 of 3)]
[im 1/11]
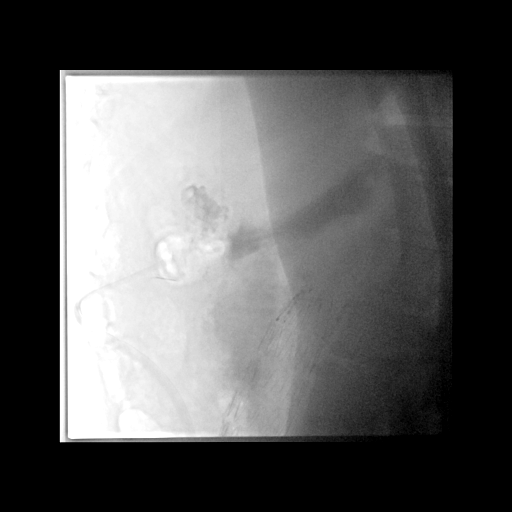
[im 1/11]
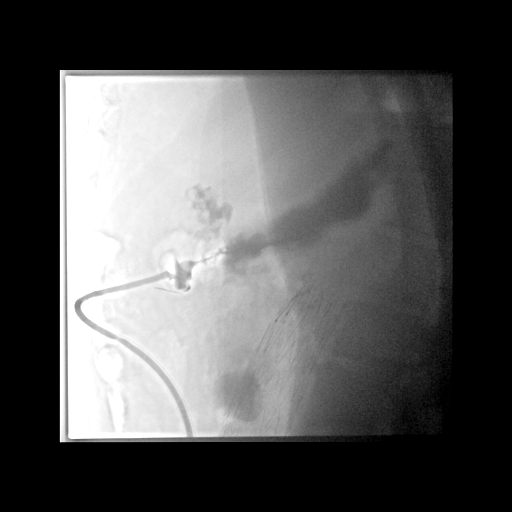
[im 3/11]
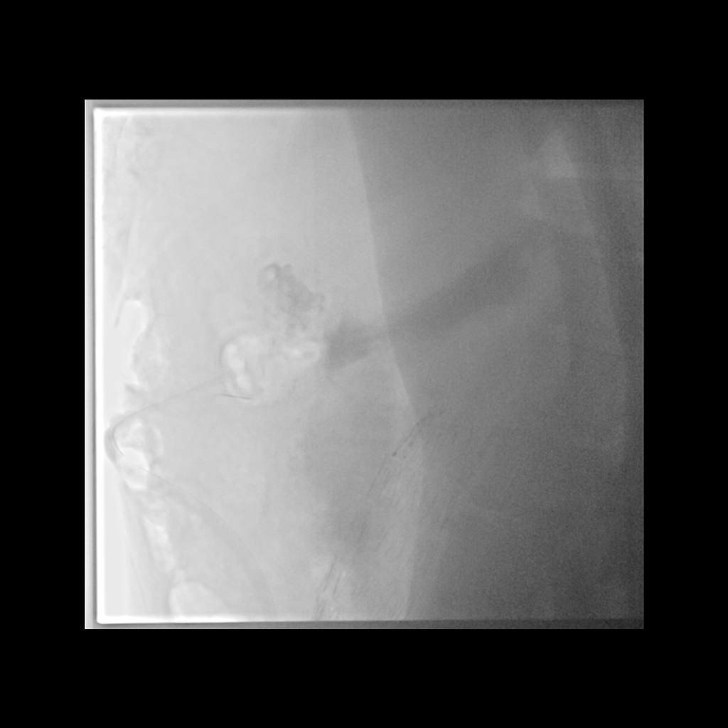
[im 6/11]
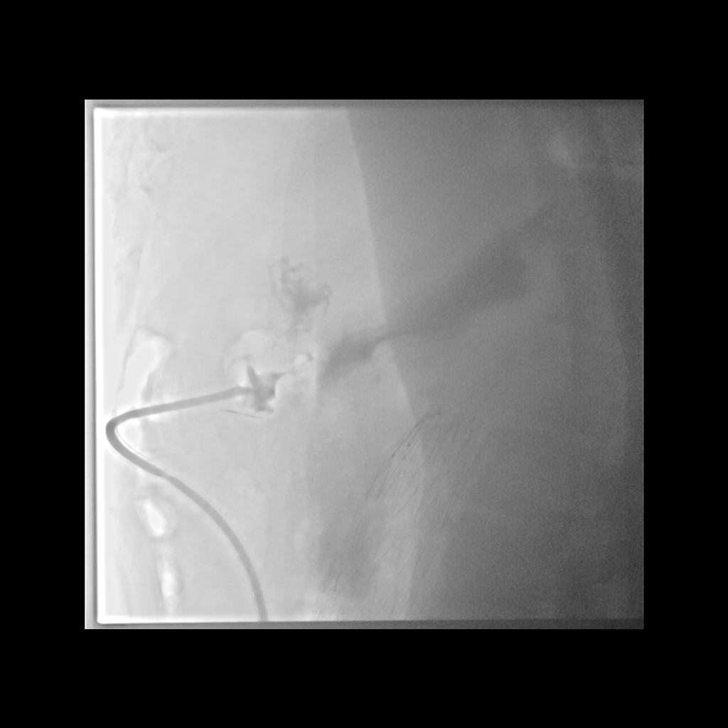
[im 8/11]
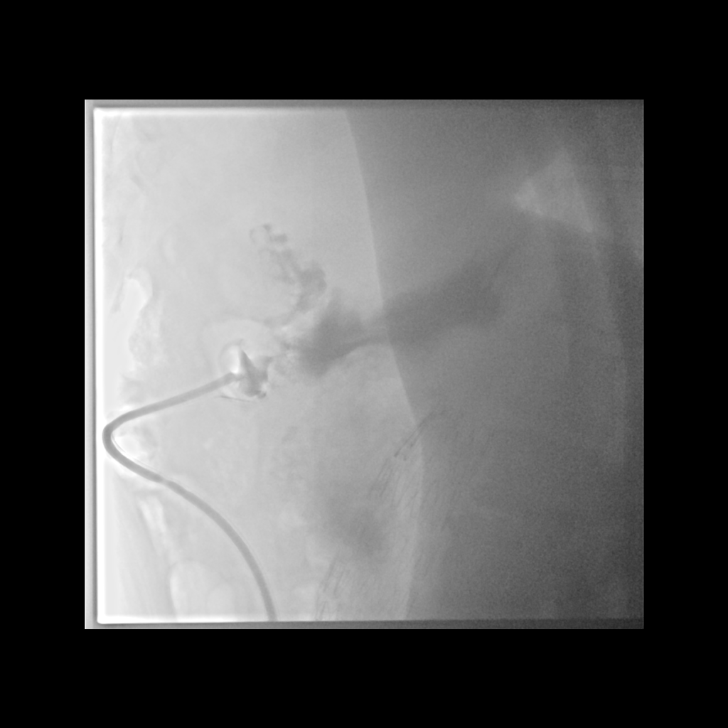
[im 11/11]
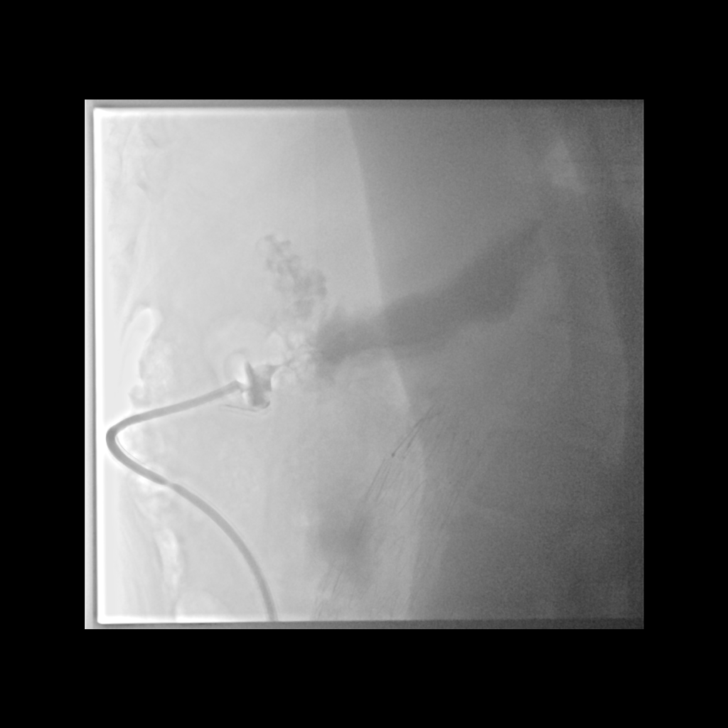

[12 of 24 positions shown; findings below may reference images not displayed]

EXAM:
GI TUBE INJECTION

MEDICATIONS:
None required

ANESTHESIA/SEDATION:
None required

CONTRAST:  10 mL Omnipaque 300 - administered into the gastric
lumen.

FLUOROSCOPY TIME:  Fluoroscopy Time: 0 minutes 30 seconds (12 mGy).

COMPLICATIONS:
None immediate.

PROCEDURE:
Informed written consent was obtained from the patient after a
thorough discussion of the procedural risks, benefits and
alternatives. All questions were addressed. A timeout was performed
prior to the initiation of the procedure.

The external bumper was very snug against the skin surface. This was
loosened. Hand injections of contrast material were then performed
in multiple obliquities. The tube is within the gastric lumen and is
functioning normally. There is some granulation tissue around the
tube entry site. This was treated with silver nitrate. Sterile
bandages were applied.
IMPRESSION: Well-positioned and functioning percutaneous gastrostomy tube. Local
care was administered to irritation around the tube insertion site.

## 2018-01-16 ENCOUNTER — Ambulatory Visit (INDEPENDENT_AMBULATORY_CARE_PROVIDER_SITE_OTHER): Payer: Medicare Other | Admitting: Podiatry

## 2018-01-16 ENCOUNTER — Encounter: Payer: Self-pay | Admitting: Podiatry

## 2018-01-16 DIAGNOSIS — G5792 Unspecified mononeuropathy of left lower limb: Secondary | ICD-10-CM | POA: Diagnosis not present

## 2018-01-16 NOTE — Progress Notes (Signed)
He presents today chief complaint of pain on the callus of the left hallux.  States is been getting worse he states that the pain is sharp comes and goes he is done nothing to treat it.  Objective: Vital signs are stable he is alert and oriented x3.  Pulses are palpable.  He has hallux malleus with a osteoarthritic process and dorsal spurring.  Exquisitely tender on palpation with radiating type pain from this area.  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  Plan: I injected today with dexamethasone 2 mg after local anesthetic was injected after sterile Betadine skin prep.  He tolerated procedure well without complications or follow-up with him in a couple weeks if he is not improved.

## 2018-01-18 ENCOUNTER — Other Ambulatory Visit: Payer: Self-pay | Admitting: Pulmonary Disease

## 2018-01-20 IMAGING — RF DG ESOPHAGUS
7 series · 24 of 24 positions shown · non-contrast
Comparison: Chest CT from 03/23/2016

CLINICAL DATA: Dysphagia for 2 months. Prior aspiration on
swallowing function study last month. Questionable esophageal
stricture.

EXAM:
ESOPHOGRAM/BARIUM SWALLOW
TECHNIQUE: Single contrast examination was performed using  thin barium.
FLUOROSCOPY TIME:  Fluoroscopy Time:  1 minutes, 24 seconds
Radiation Exposure Index (if provided by the fluoroscopic device):
14 mGy
Number of Acquired Spot Images: 0

[Series 1: cp_standard · 0.35mm/px · 4 of 48 frames shown (1 of 7)]
[frame 8/48]
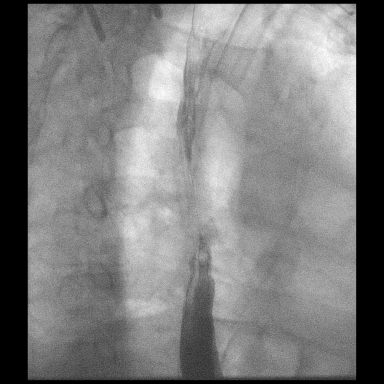
[frame 25/48]
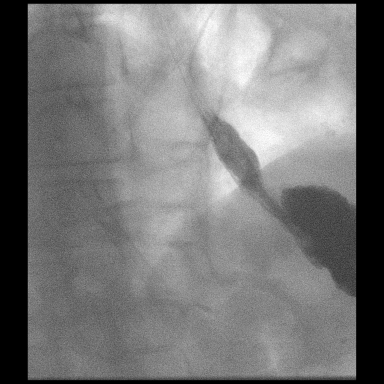
[frame 38/48]
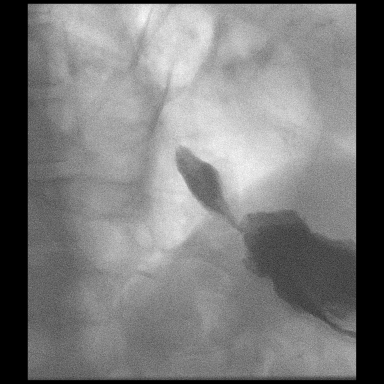
[frame 41/48]
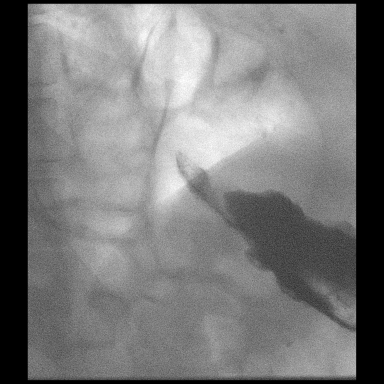

[Series 2: cp_standard · 0.35mm/px · 3 of 41 frames shown (2 of 7)]
[frame 21/41]
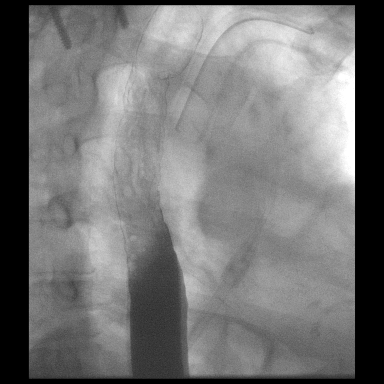
[frame 35/41]
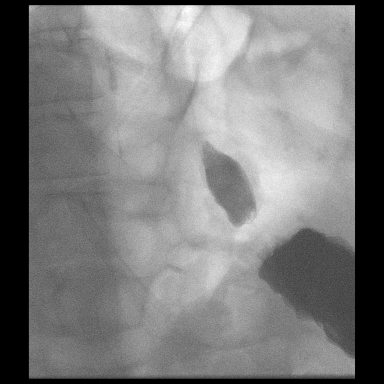
[frame 41/41]
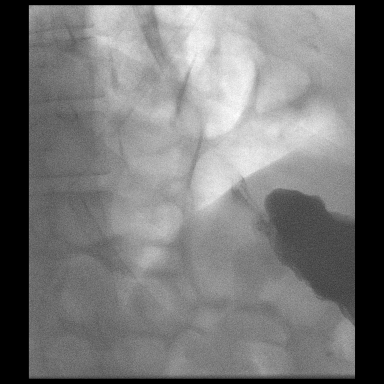

[Series 3: cp_standard · 0.35mm/px · 4 of 61 frames shown (3 of 7)]
[frame 10/61]
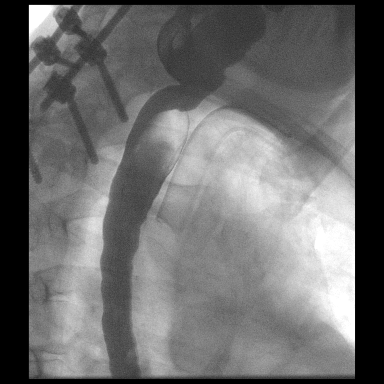
[frame 31/61]
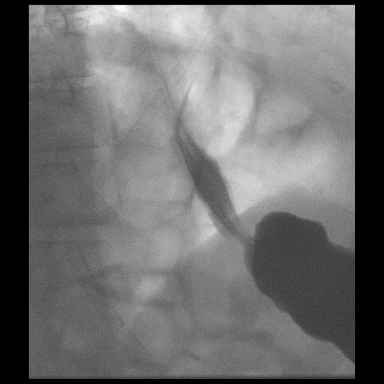
[frame 44/61]
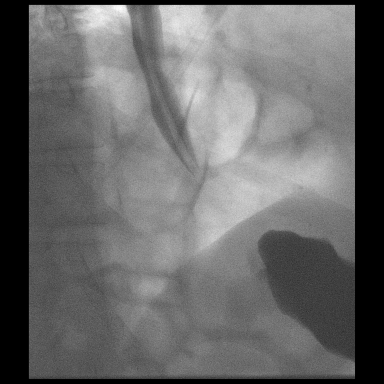
[frame 52/61]
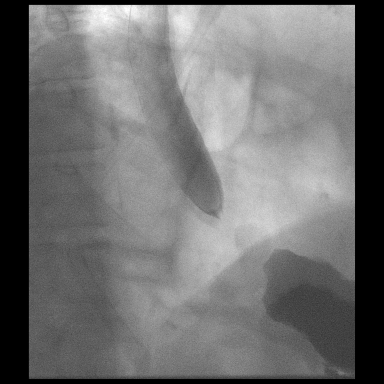

[Series 4: cp_standard · 0.35mm/px · 3 of 39 frames shown (4 of 7)]
[frame 6/39]
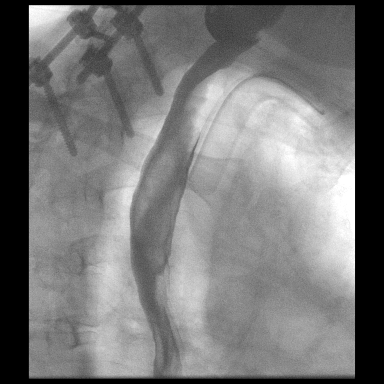
[frame 34/39]
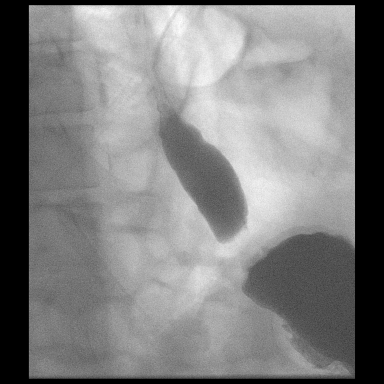
[frame 39/39]
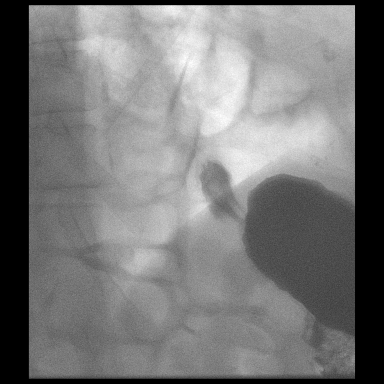

[Series 5: cp_standard · 0.35mm/px · 4 of 29 frames shown (5 of 7)]
[frame 5/29]
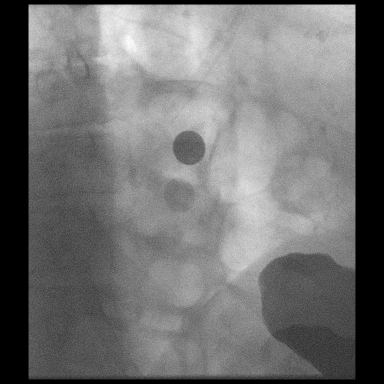
[frame 15/29]
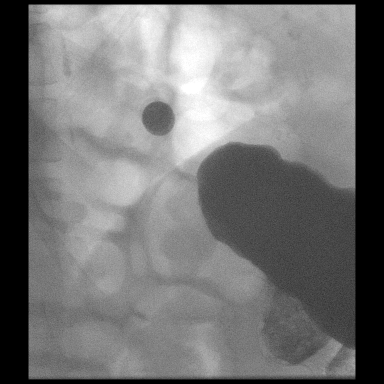
[frame 23/29]
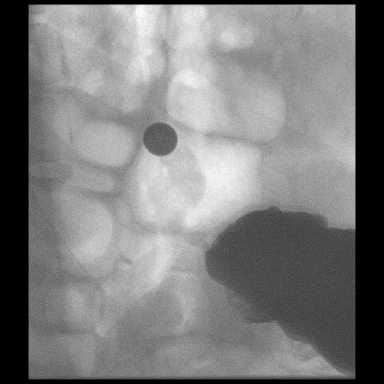
[frame 25/29]
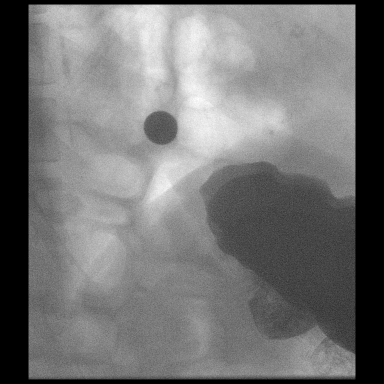

[Series 6: cp_standard · 0.35mm/px · 2 of 21 frames shown (6 of 7)]
[frame 4/21]
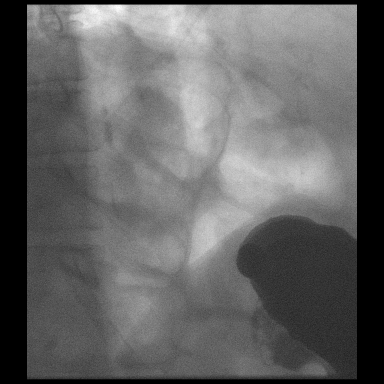
[frame 11/21]
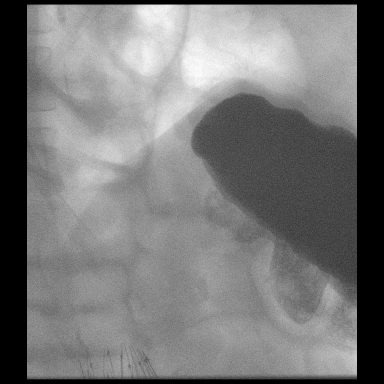

[Series 7: cp_standard · 0.35mm/px · 4 of 21 frames shown (7 of 7)]
[frame 4/21]
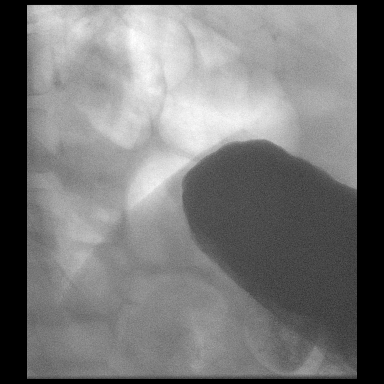
[frame 11/21]
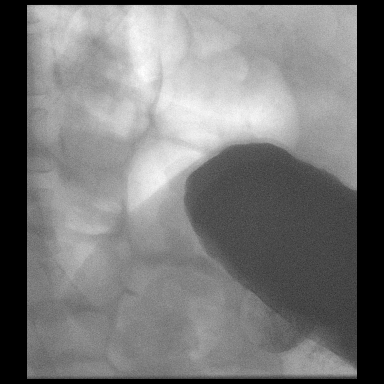
[frame 14/21]
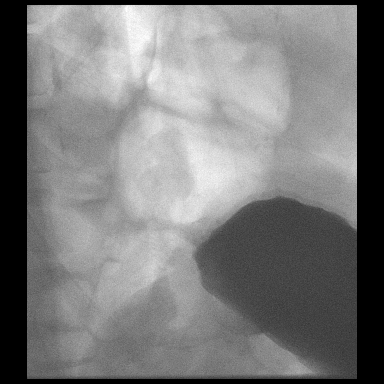
[frame 18/21]
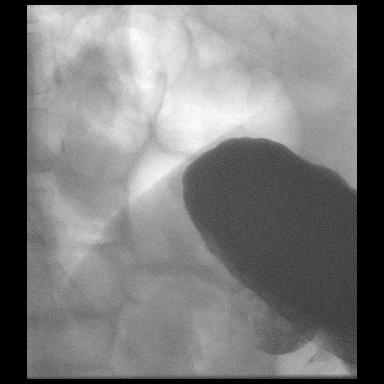

[24 of 24 positions shown; findings below may reference images not displayed]

FINDINGS: A tracheostomy tube is in place.  Cervical thoracic fixators noted.

The patient's entire exam was performed in the LPO position because
of limited mobility. Accordingly, the pharyngeal phase of contrast
was not assessed.

Primary peristaltic waves in the esophagus were normal on all RO all
swallows. There is some narrowing at the junction of the cervical
and thoracic esophagus for example image [DATE] which may be
projectional and related to a dilated cervical segment.

Smooth distal tapering of the esophagus, I was not able to distend
this more than about 10 mm at the gastroesophageal junction.
However, there is no irregularity seen in this vicinity. A 13 mm
barium tablet transiently impacted in the distal esophagus but
subsequently passed indicating that the narrowing at the GE junction
is at least 13 mm in diameter.
IMPRESSION: 1. Smooth narrowing at the gastroesophageal junction, but a 13 mm
barium tablet did pass beyond this narrowing which is accordingly
greater than 13 mm in diameter. I do not see irregularity typical
for a mass in this vicinity. Endoscopy may be helpful in further
characterization if clinically warranted.
2. The appearance of slight narrowing at the junction of the
cervical and thoracic esophagus is probably due to distention of the
cervical esophagus and projection rather than a true narrowing.
3. Tracheostomy tube noted.
4. Please note that on like abnormal esophagram, today's exam was
entirely performed in the LPO position due to patient mobility
issues. We do not assess the pharyngeal phase of swallowing and
today's exam has somewhat reduced sensitivity and specificity
compared to a normal esophagram.

## 2018-01-25 IMAGING — DX DG ABDOMEN 1V
1 series · 1 of 1 positions shown · non-contrast
Comparison: GI tube injection 05/12/2016

CLINICAL DATA: Gastrostomy tube not functioning.  Pain for 3 days.

EXAM:
ABDOMEN - 1 VIEW

[abdomen kub]
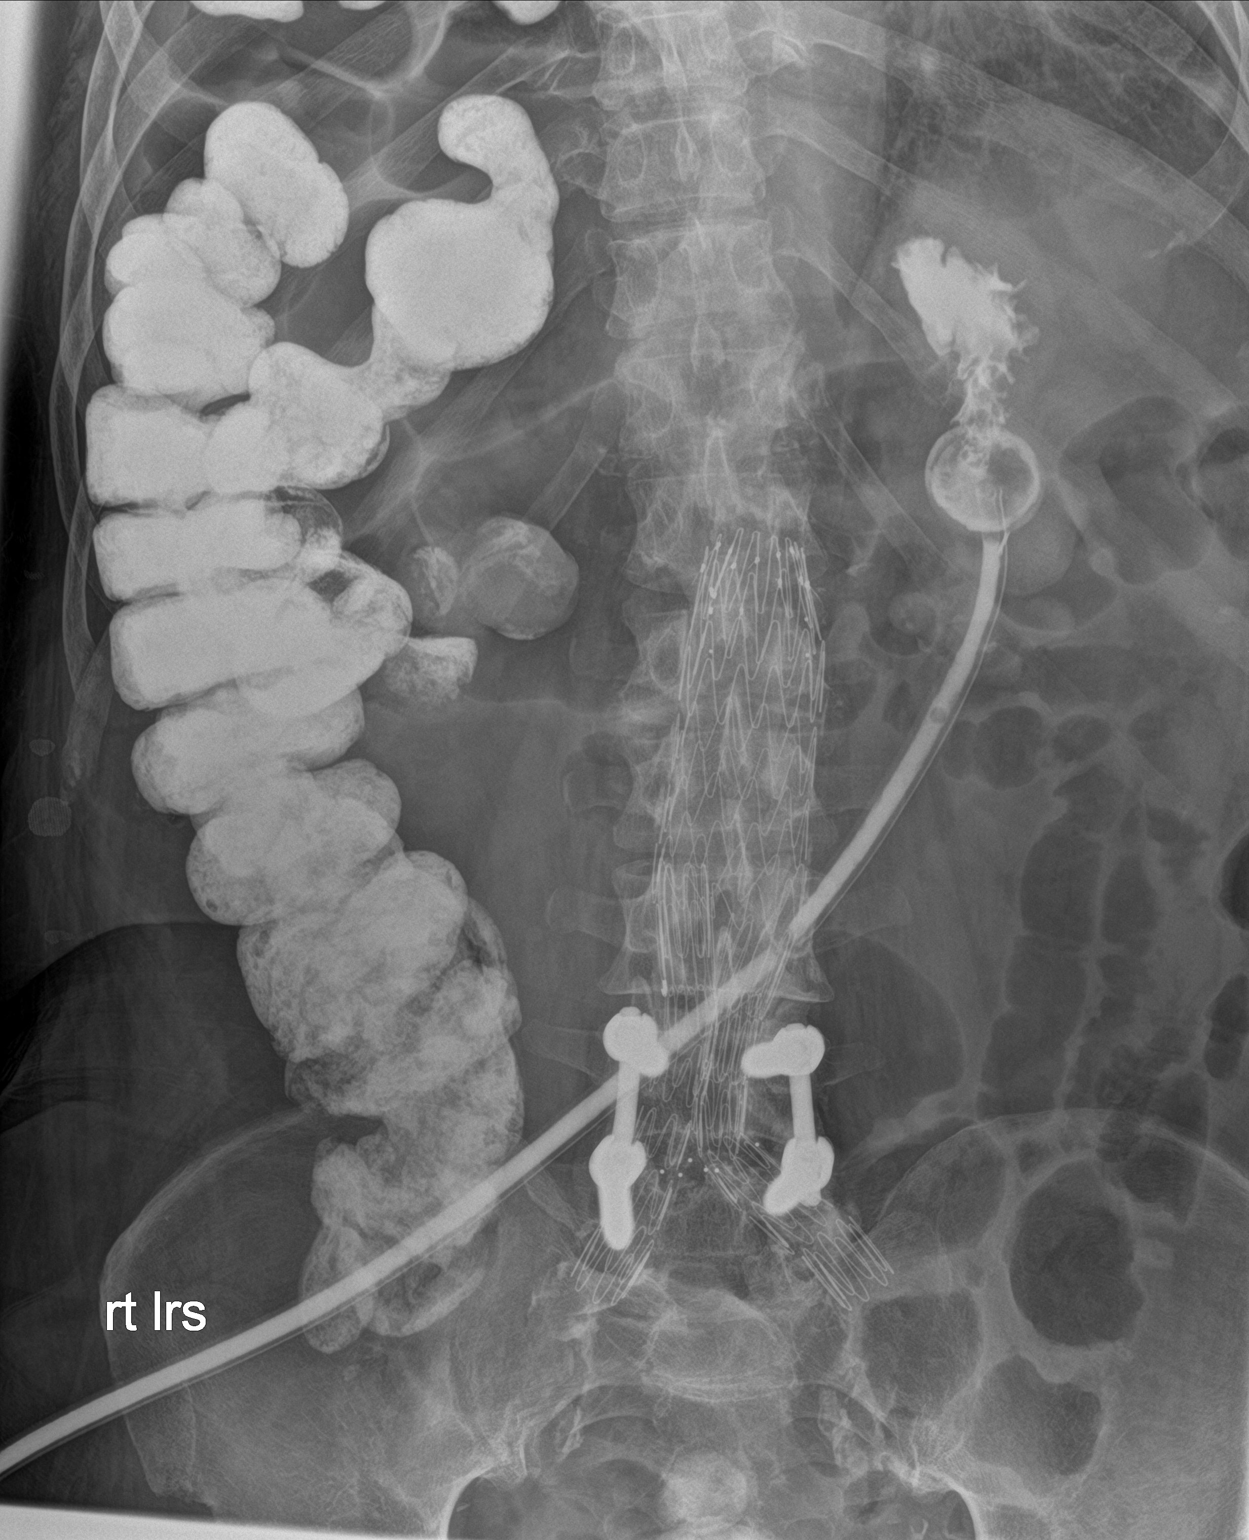

[1 of 1 positions shown; findings below may reference images not displayed]

FINDINGS: 15 cc Gastrografin injected through indwelling gastrostomy tube. No
additional contrast could be injected due to resistance. Injected
contrast opacifies the gastric fundus. There is contrast within
gastrostomy tubing and possibly within the gastrostomy balloon. No
gross evidence of extravasation. There is contrast within the
ascending and proximal transverse colon from recent esophagram.
Nonobstructive bowel gas pattern. Aorto bi-iliac stent graft.
IMPRESSION: Contrast injected through gastrostomy tube opacifies the gastric
fundus, no evidence of extravasation. Only 15 cc Gastrografin was
injected due to resistance and patient discomfort. A follow-up exam
could be considered to evaluate for contrast passage.

Questionable contrast within the gastrostomy balloon, can be seen if
contrast was instilled through the balloon port.

## 2018-01-25 IMAGING — XA IR GI TUBE CONTRAST INJECTION
3 series · 19 of 24 positions shown · non-contrast
Comparison: none

INDICATION: 70-year-old male with a percutaneous gastrostomy tube.

[Series 1: fl angio · 2 of 95 frames shown (1 of 2)]
[frame 15/95]
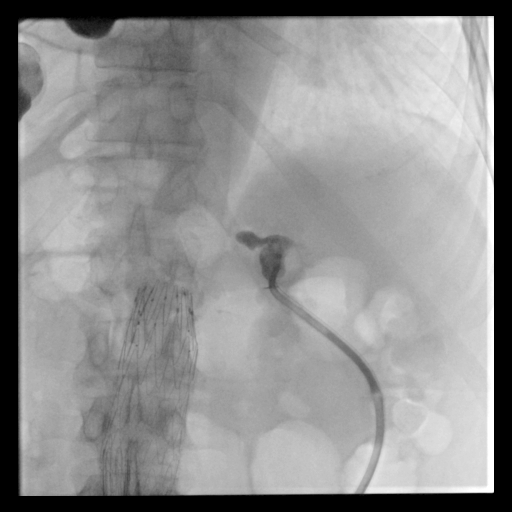
[frame 48/95]
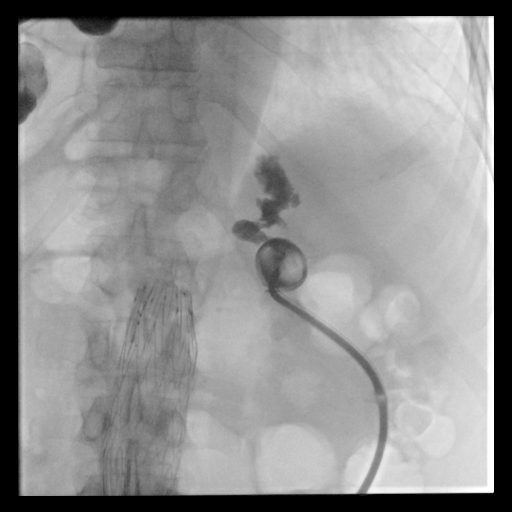

[Series 2: fl angio · 4 of 102 frames shown (2 of 2)]
[frame 16/102]
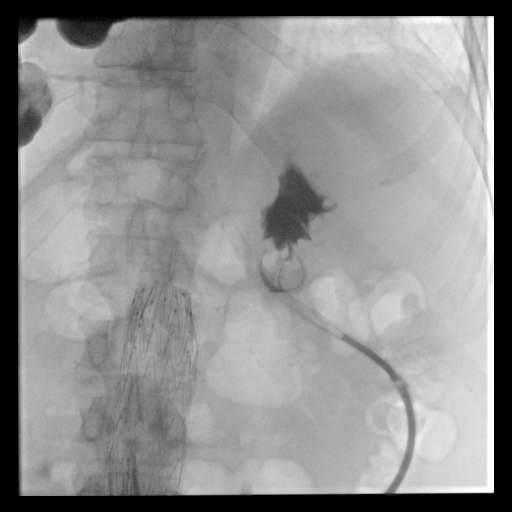
[frame 18/102]
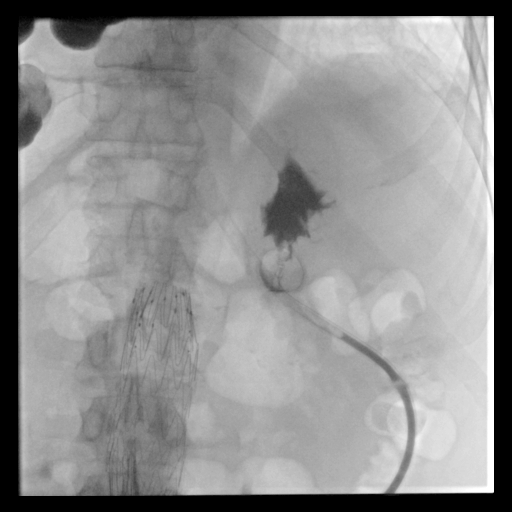
[frame 52/102]
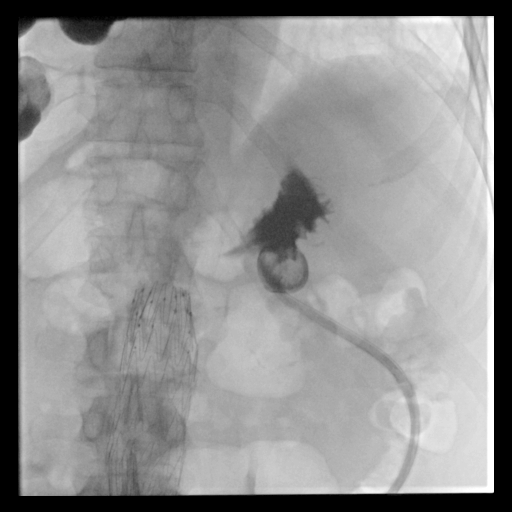
[frame 87/102]
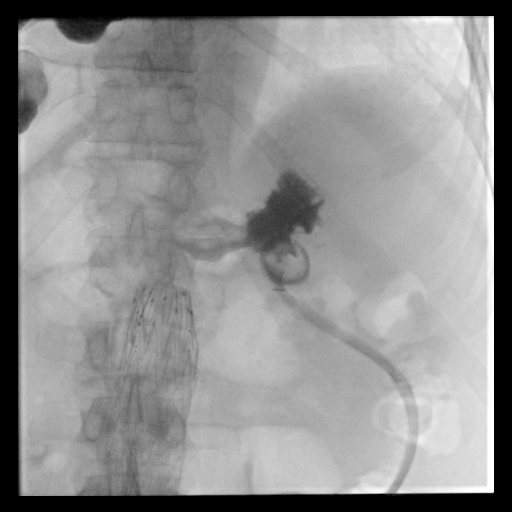

[Series 300: ir cm inj any colonic tube w/fluoro · 13 of 21 slices shown]
[im 3/21]
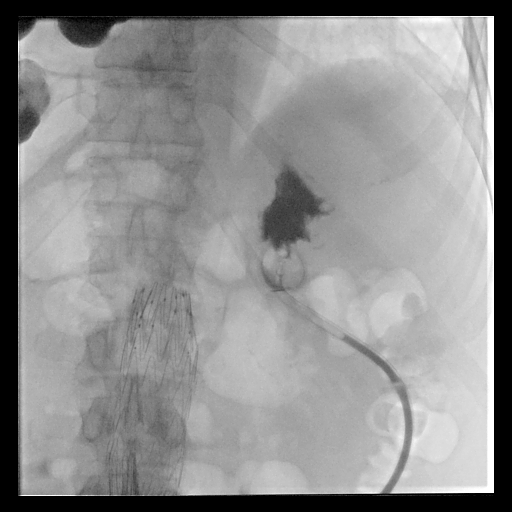
[im 4/21]
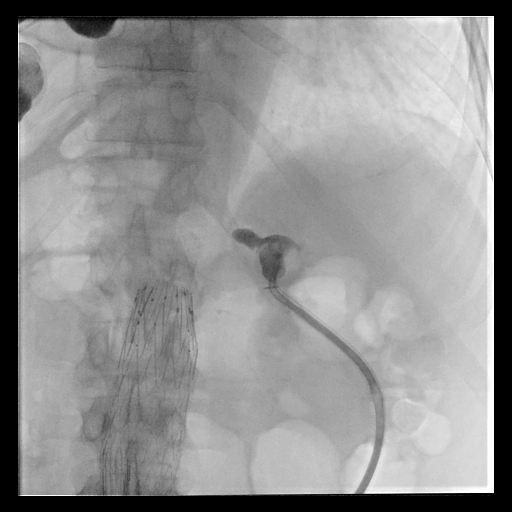
[im 5/21]
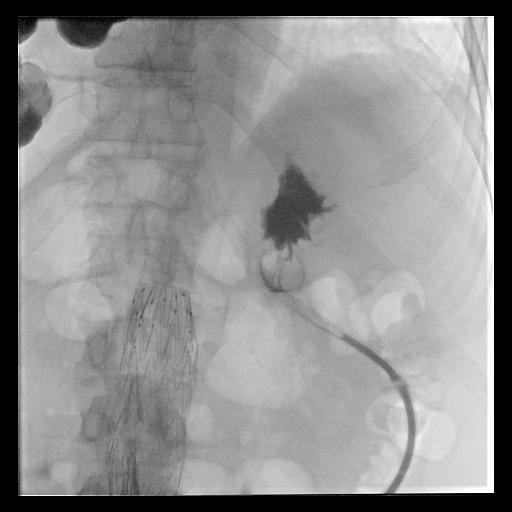
[im 8/21]
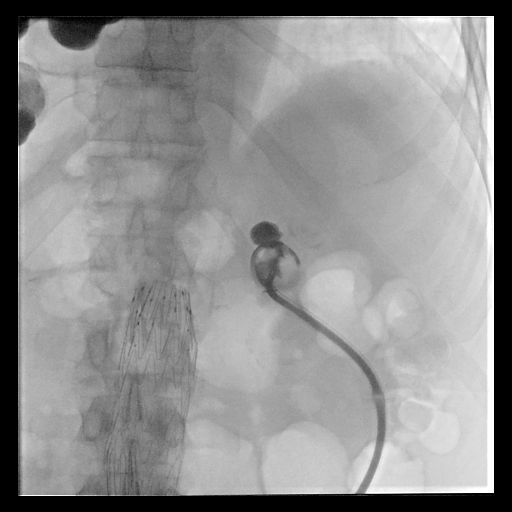
[im 9/21]
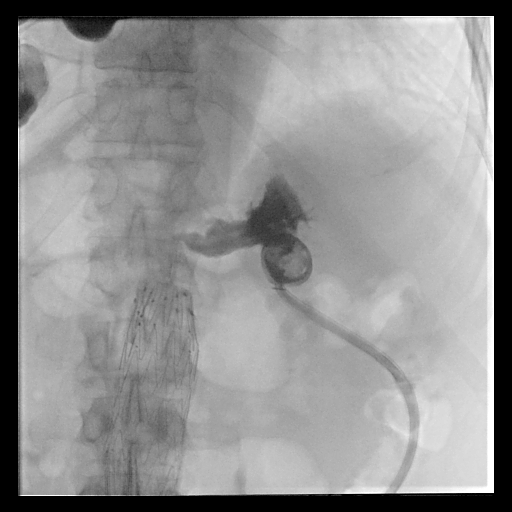
[im 10/21]
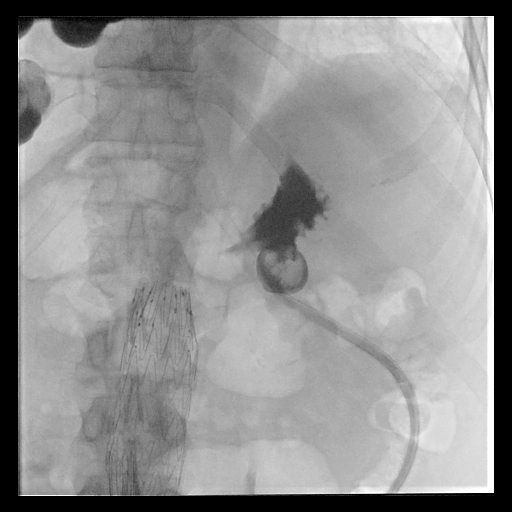
[im 11/21]
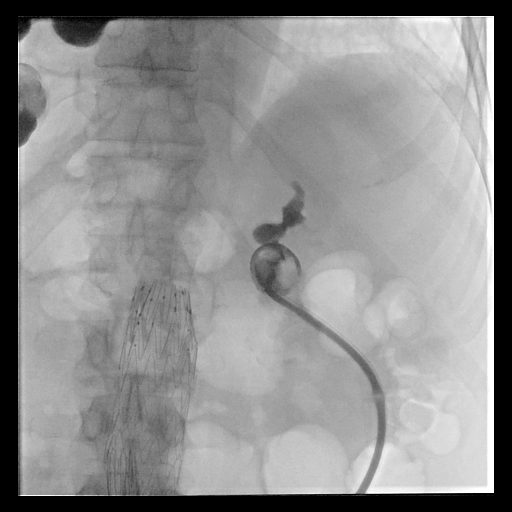
[im 14/21]
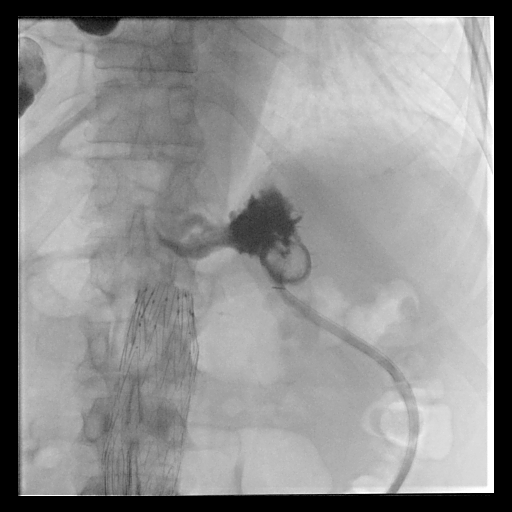
[im 15/21]
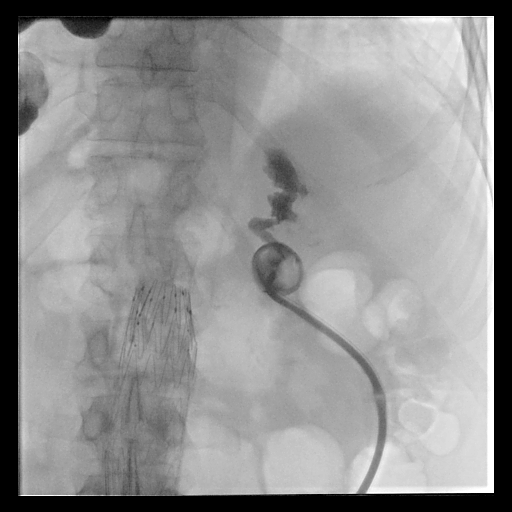
[im 16/21]
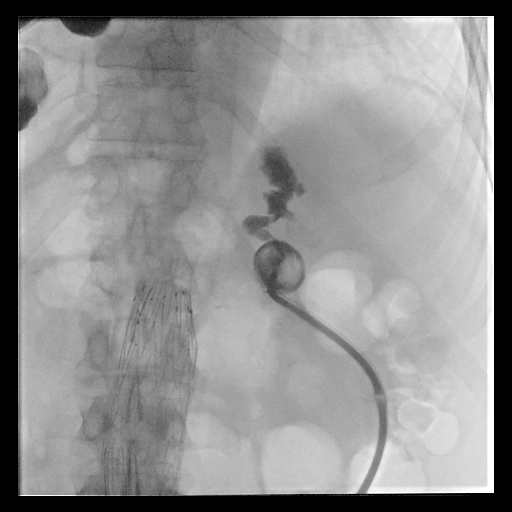
[im 17/21]
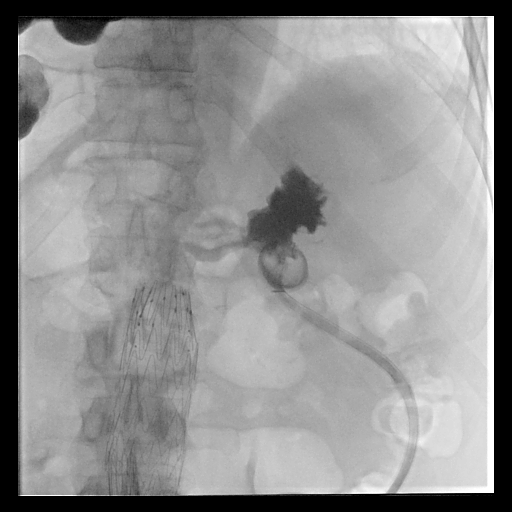
[im 20/21]
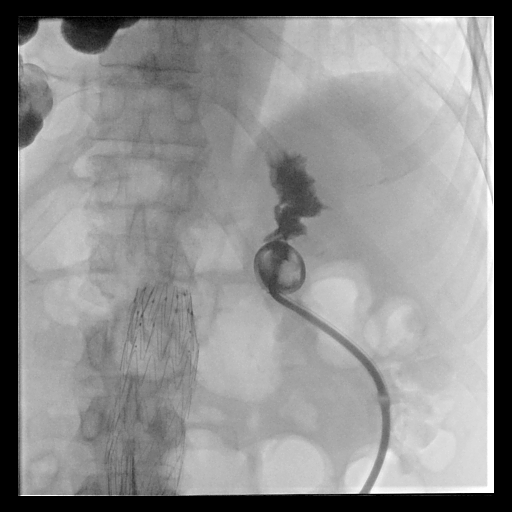
[im 21/21]
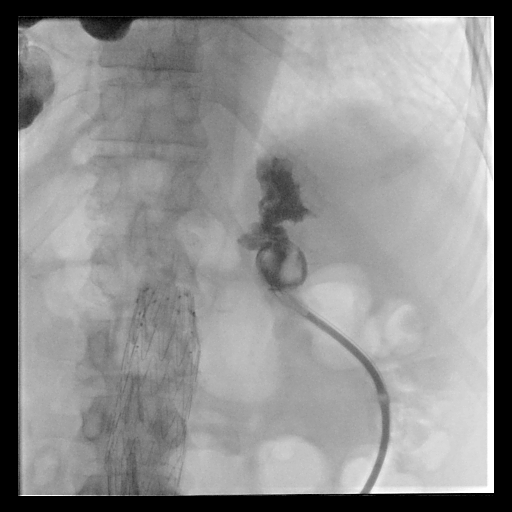

[19 of 24 positions shown; findings below may reference images not displayed]

EXAM:
GI TUBE INJECTION

MEDICATIONS:
None

ANESTHESIA/SEDATION:
None

CONTRAST:  10 mL Isovue 370 - administered into the gastric lumen.

FLUOROSCOPY TIME:  Fluoroscopy Time: 0 minutes 24 seconds (10 mGy).

COMPLICATIONS:
None immediate.

PROCEDURE:
Informed written consent was obtained from the patient after a
thorough discussion of the procedural risks, benefits and
alternatives. All questions were addressed. A timeout was performed
prior to the initiation of the procedure.

Injection of contrast material confirms that the percutaneous
gastrostomy tube is in an intragastric position.
IMPRESSION: The percutaneous gastrostomy tube is well positioned and
intragastric in location.

## 2018-01-27 IMAGING — XA IR REPLACE G-TUBE/COLONIC TUBE
1 series · 8 of 8 positions shown · non-contrast
Comparison: none

INDICATION: Dislodged gastrostomy tube.  Request for placement

[Series 300: tube placements · 8 of 8 slices shown]
[im 1/8]
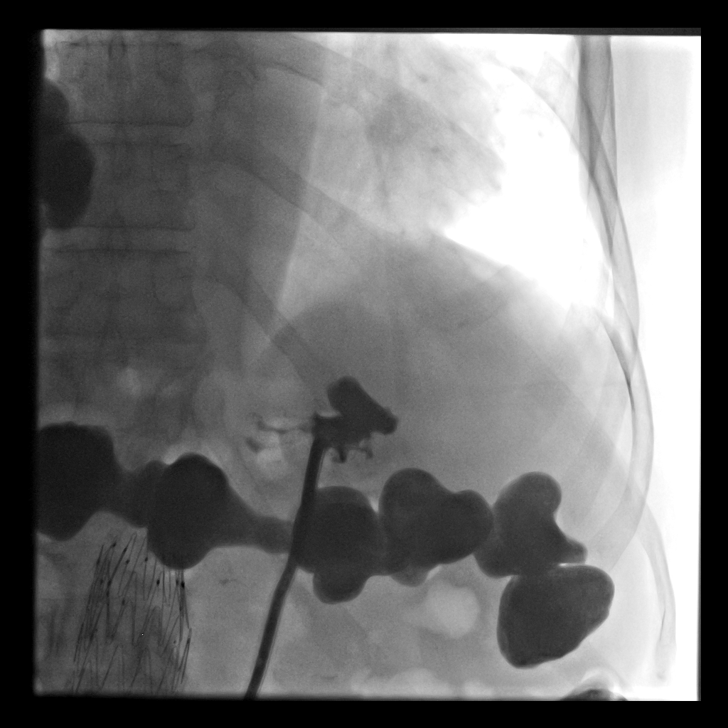
[im 2/8]
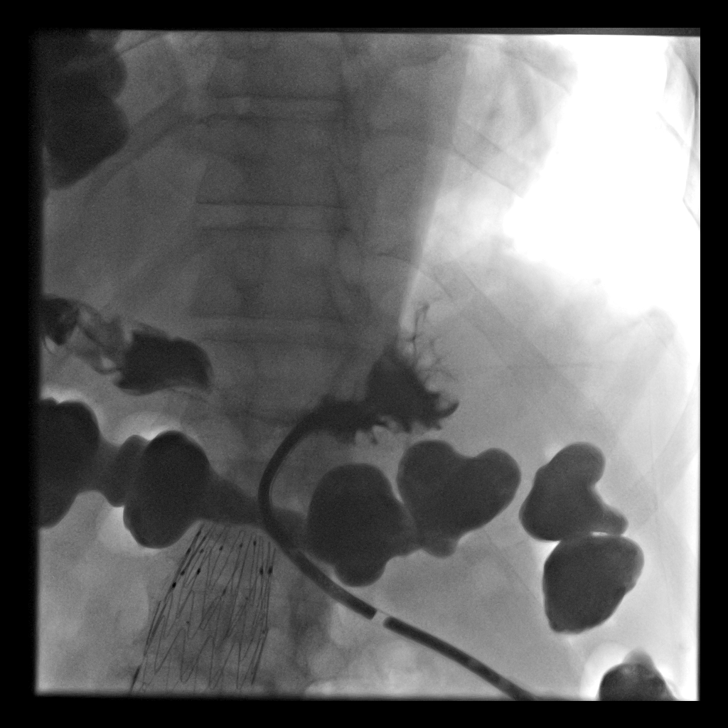
[im 3/8]
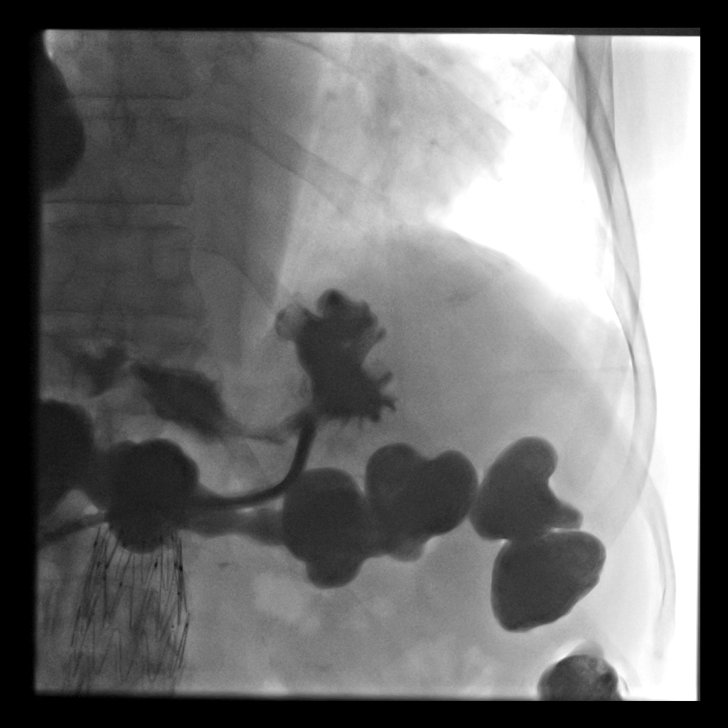
[im 4/8]
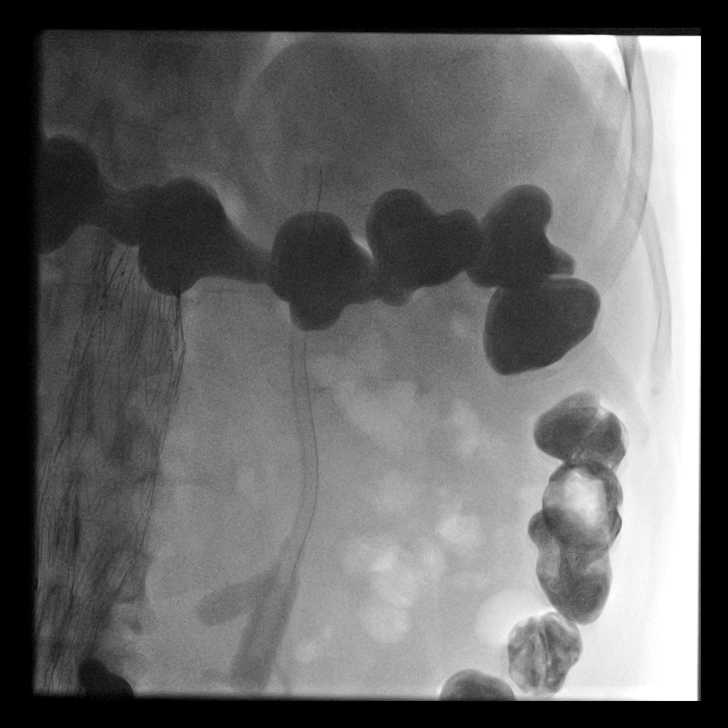
[im 5/8]
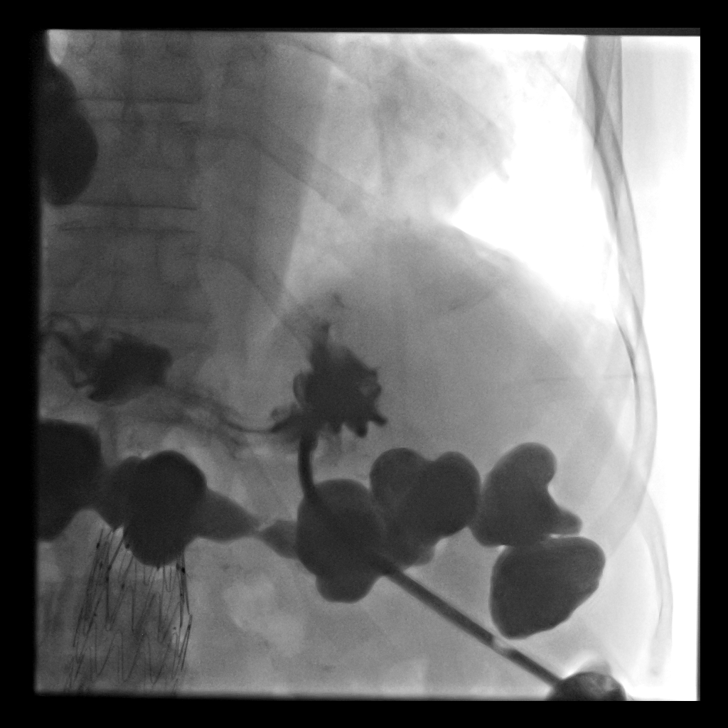
[im 6/8]
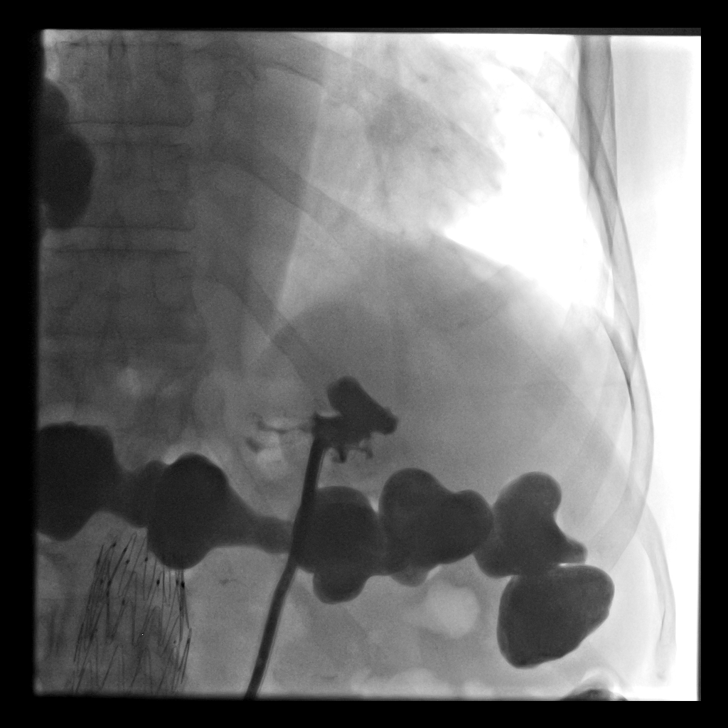
[im 7/8]
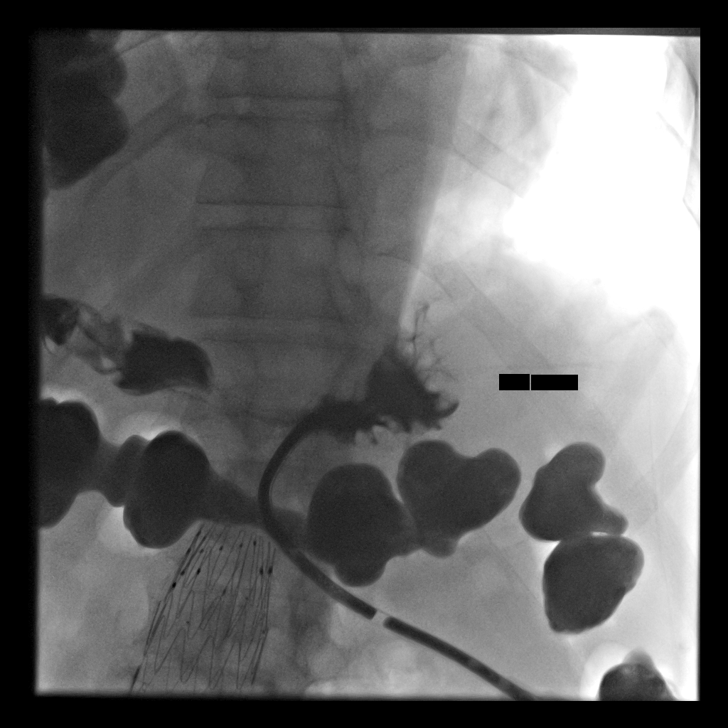
[im 8/8]
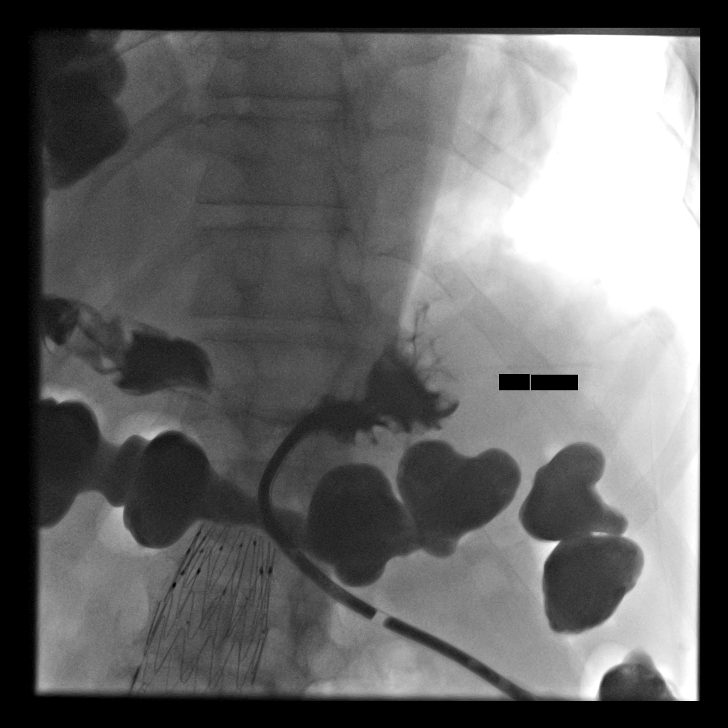

[8 of 8 positions shown; findings below may reference images not displayed]

EXAM:
GASTROSTOMY CATHETER REPLACEMENT

MEDICATIONS:
None

ANESTHESIA/SEDATION:
None

CONTRAST:  10 mL Isovue - administered into the gastric lumen.

FLUOROSCOPY TIME:  Fluoroscopy Time:  24 seconds

COMPLICATIONS:
None immediate.

PROCEDURE:
Informed written consent was obtained from the patient after a
thorough discussion of the procedural risks, benefits and
alternatives. All questions were addressed. Maximal Sterile Barrier
Technique was utilized including caps, mask, sterile gowns, sterile
gloves, sterile drape, hand hygiene and skin antiseptic. A timeout
was performed prior to the initiation of the procedure.

Gastrostomy site identified in the left upper quadrant. A new 20
French, balloon retention gastrostomy tube was inserted without
difficulty. The balloon was inflated with 8 cc normal saline. The
bumper was secured. Fluoroscopic images after contrast injection
were obtained in multiple obliquities and confirmed intragastric
location of the new gastrostomy tube.
IMPRESSION: Successful replacement of 20 French balloon retention gastrostomy
tube.

## 2018-02-03 IMAGING — RF DG SWALLOWING FUNCTION
1 series · 17 of 24 positions shown · non-contrast
Comparison: None.

CLINICAL DATA: Dysphagia.

EXAM:
MODIFIED BARIUM SWALLOW
TECHNIQUE: Different consistencies of barium were administered orally to the
patient by the Speech Pathologist. Imaging of the pharynx was
performed in the lateral projection.
FLUOROSCOPY TIME:  Fluoroscopy Time:  0 minutes 37 seconds.
Radiation Exposure Index (if provided by the fluoroscopic device):
Not available.
Number of Acquired Spot Images: 0

[Series 1: run · 14 acquisitions, 17 frames shown]
[im 1/14]
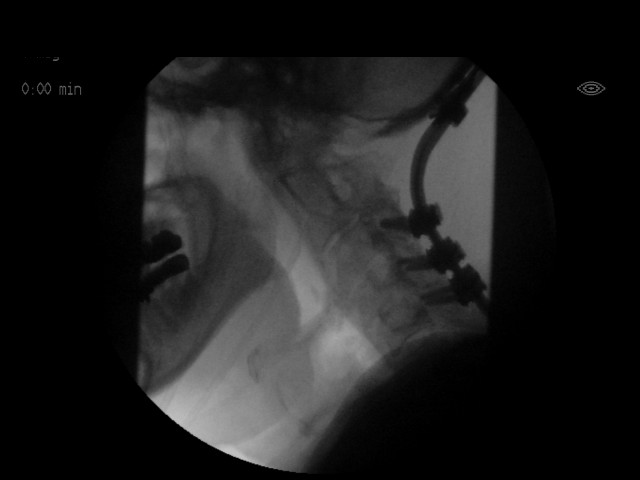
[im 2/14]
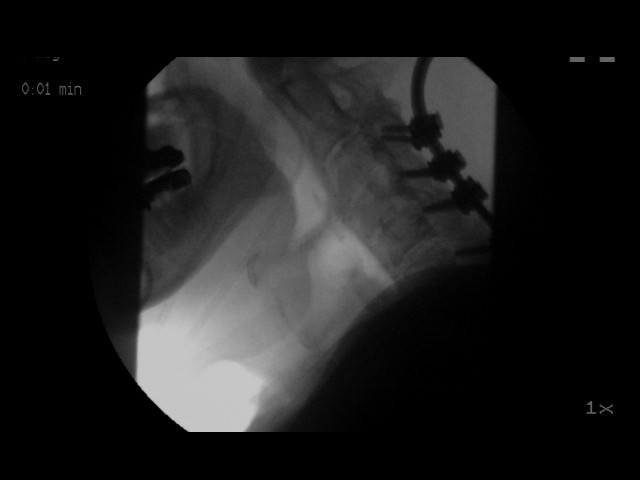
[im 2/14]
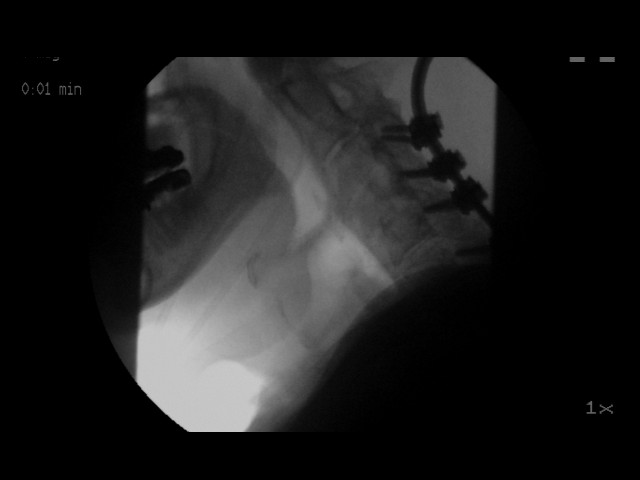
[im 3/14]
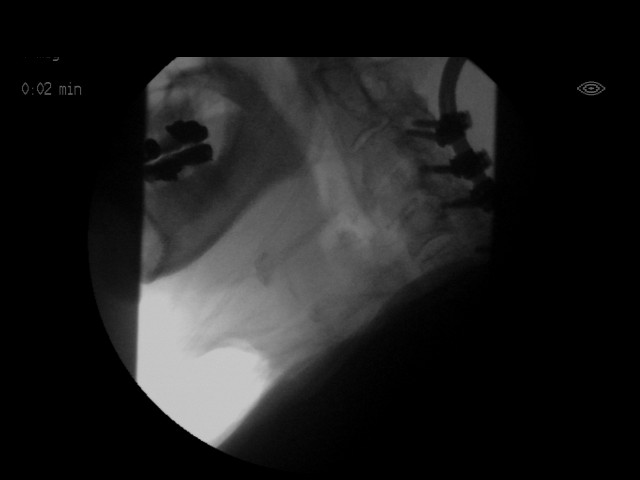
[im 4/14]
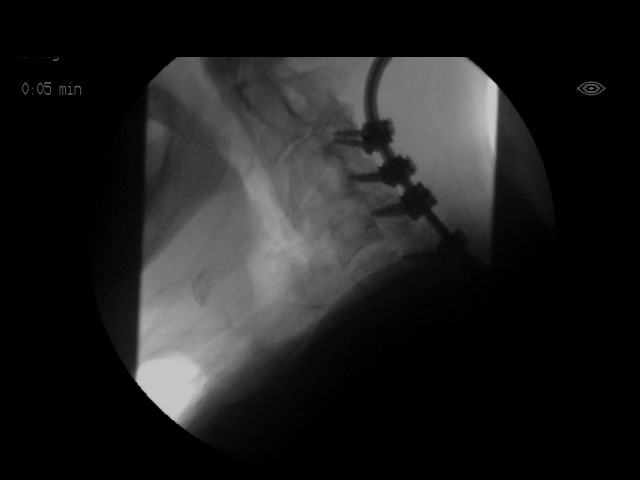
[im 5/14]
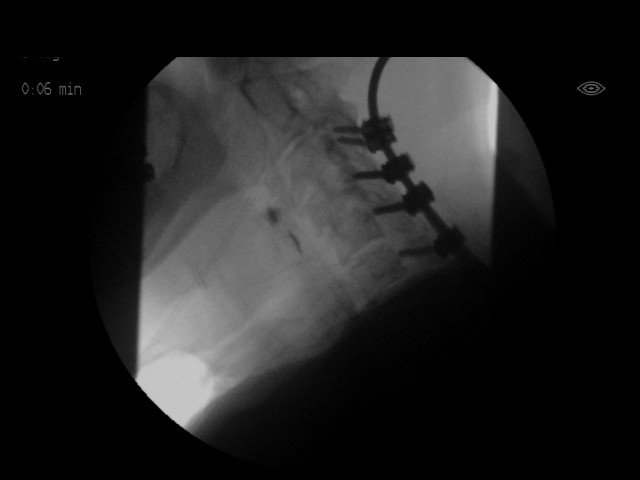
[im 6/14]
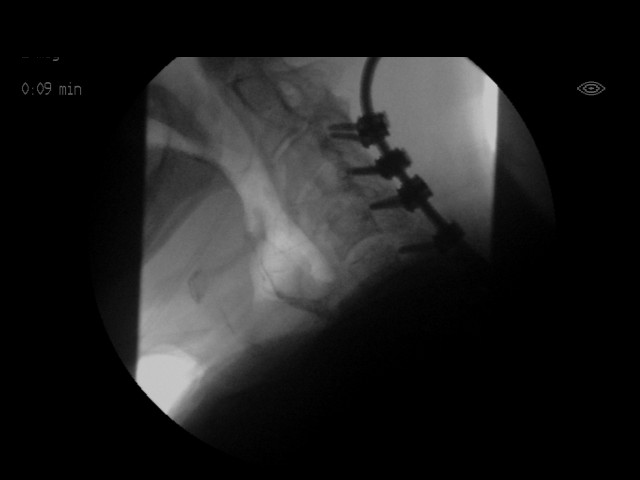
[im 7/14]
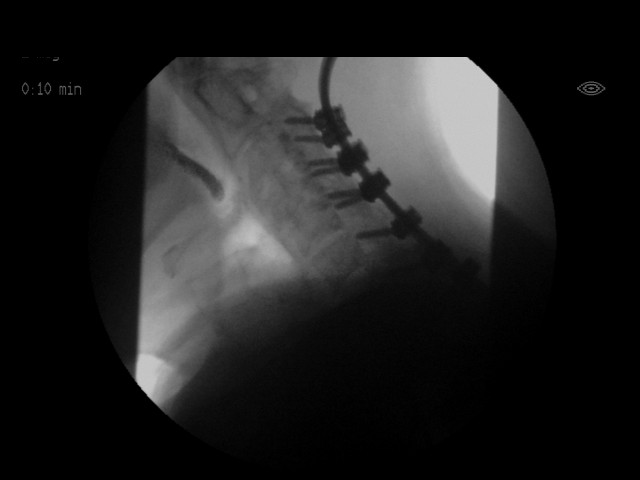
[im 8/14]
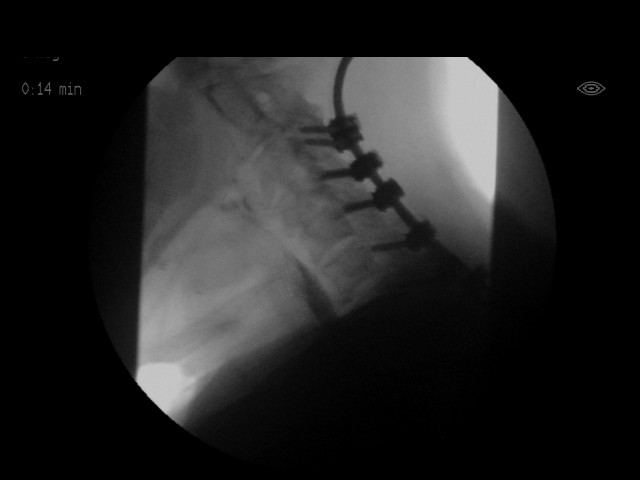
[im 8/14]
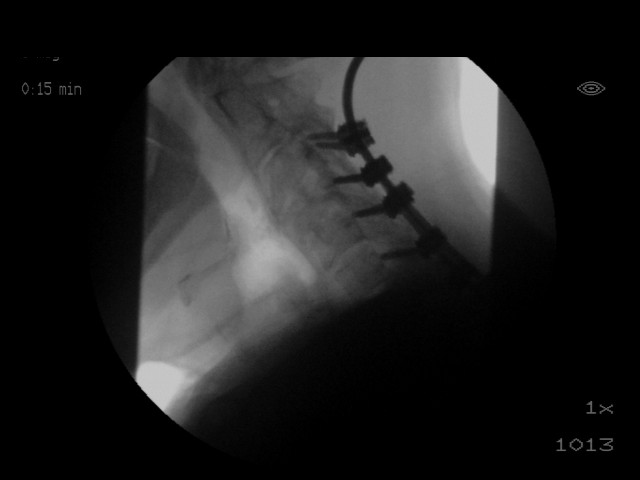
[im 9/14]
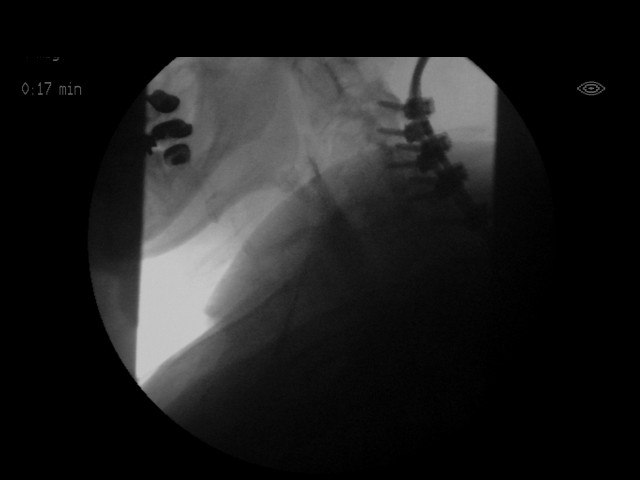
[im 10/14]
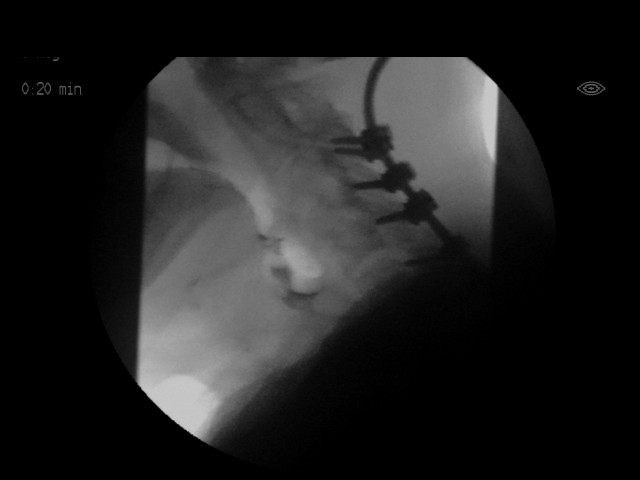
[im 11/14]
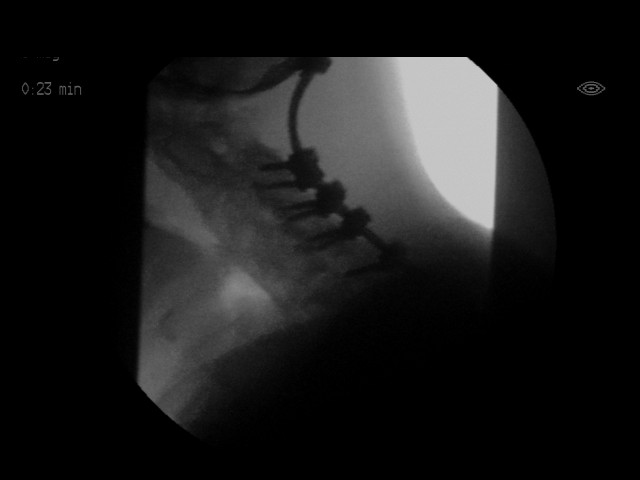
[im 12/14]
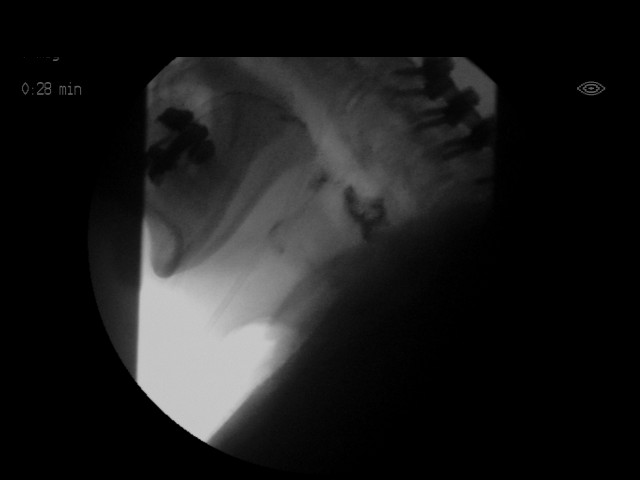
[im 13/14]
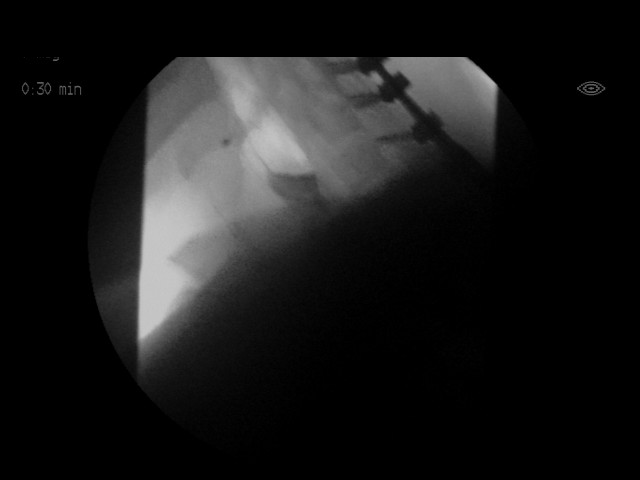
[im 13/14]
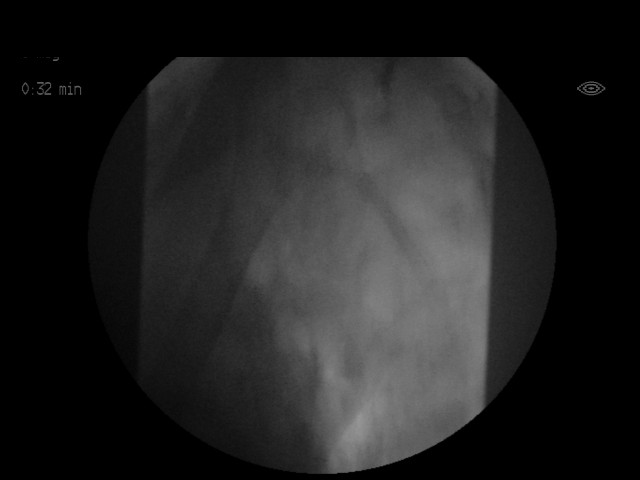
[im 14/14]
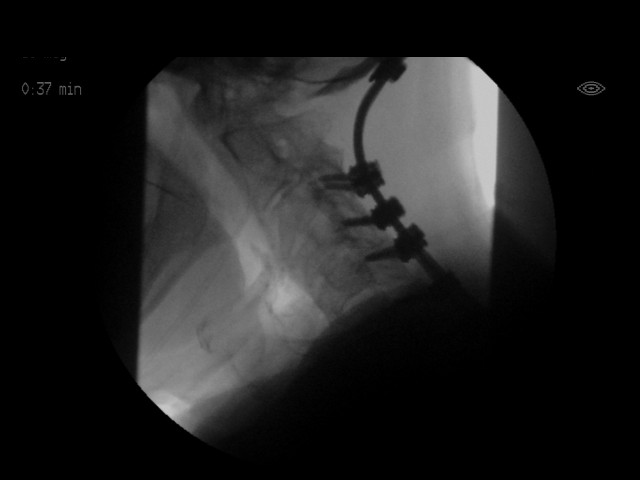

[17 of 24 positions shown; findings below may reference images not displayed]

FINDINGS: Thin liquid- flash penetration, no aspiration. Minimal retention
which clears normally.

Nectar thick liquid- not assessed.

Honey- not assessed.

Adoniram?Adlaho mild premature spillover.  Otherwise within normal limits.

Cracker-mild premature spillover.  Otherwise within normal limits.

Adoniram?Hoseyin with cracker- not assessed.

Barium tablet -  within normal limits
IMPRESSION: Flash penetration with thin liquids.  No aspiration.

Please refer to the Speech Pathologists report for complete details
and recommendations.

## 2018-02-14 ENCOUNTER — Ambulatory Visit: Payer: Medicare Other | Admitting: Podiatry

## 2018-02-17 ENCOUNTER — Ambulatory Visit: Payer: Medicare Other | Admitting: Pulmonary Disease

## 2018-02-21 ENCOUNTER — Encounter: Payer: Self-pay | Admitting: Pulmonary Disease

## 2018-02-21 ENCOUNTER — Ambulatory Visit (INDEPENDENT_AMBULATORY_CARE_PROVIDER_SITE_OTHER): Payer: Medicare Other | Admitting: Pulmonary Disease

## 2018-02-21 VITALS — BP 120/72 | HR 62 | Ht 68.0 in | Wt 242.2 lb

## 2018-02-21 DIAGNOSIS — J9611 Chronic respiratory failure with hypoxia: Secondary | ICD-10-CM

## 2018-02-21 DIAGNOSIS — I6523 Occlusion and stenosis of bilateral carotid arteries: Secondary | ICD-10-CM | POA: Diagnosis not present

## 2018-02-21 DIAGNOSIS — Z23 Encounter for immunization: Secondary | ICD-10-CM

## 2018-02-21 DIAGNOSIS — J986 Disorders of diaphragm: Secondary | ICD-10-CM | POA: Diagnosis not present

## 2018-02-21 DIAGNOSIS — J449 Chronic obstructive pulmonary disease, unspecified: Secondary | ICD-10-CM | POA: Diagnosis not present

## 2018-02-21 DIAGNOSIS — I2781 Cor pulmonale (chronic): Secondary | ICD-10-CM | POA: Diagnosis not present

## 2018-02-21 DIAGNOSIS — J9612 Chronic respiratory failure with hypercapnia: Secondary | ICD-10-CM

## 2018-02-21 NOTE — Progress Notes (Signed)
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 Extubated 07/21/16    DATA: CT chest 03/23/16: Right hemidiaphragm is elevated. No pleural fluid on the left. Minimal hazy infiltrate present in the apex of the left upper lobe. The right chest shows a small effusion largely layering dependently. There is extensive airspace filling in the upper lobe consistent with bronchopneumonia. There is patchy density in the right lower lobe in the right middle lobe that could be atelectasis or lesser bronchopneumonia. The appearance is improved since the CT of 03/03/16 Echocardiogram 07/17/16: grade I diastolic dysfunction. LVEF 60%. LA mildly dilated. RV mildly dilated CT head and neck 09/26/16: No acute intracranial abnormality identified. Small scalp contusion in the superior frontal region. No displaced calvarial fracture. 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 No acute fracture of the cervical spine. Stable postsurgical changes related to occipital to T2 fusion, C1 the C6 posterior decompression. No apparent hardware related complication. Stable advanced cervical spondylosis greatest at the C3 through C5 levels. Hospitalized 04/08-04/10/18 for hypercapnic respiratory failure Hospitalized 03/22-03/25/19 worsening LE edema and dyspnea. Given dx of PNA based on CT chest findings but his presentation was not consistent with PNA CT chest 09/09/17: Elevated right hemidiaphragm with RLL atelectasis Hospitalized 04/16-04/22/19: acute/chronic hypercarbic failure due to  AECOPD CT chest 10/09/17: Mild right upper lobe pneumonia. Elevated right hemidiaphragm with compressive atelectasis in the adjacent right lower lobe and right middle lobe.  INTERVAL: Last seen 10/24/2017.  No major pulmonary events  SUBJ: This is a scheduled follow-up.  He has no major new problems.  He does report that he "wheezes" occasionally.  He also has mild cough productive of clear mucus.  He reports no significant change in exertional dyspnea.  He is using Symbicort and Spiriva as previously prescribed.  He has a rescue inhaler (albuterol) which he never uses.  He is wearing oxygen with sleep but is not using it during the daytime with exertion as previously recommended.  OBJ: Vitals:   02/21/18 1016  BP: 120/72  Pulse: 62  SpO2: 90%  Weight: 242 lb 3.2 oz (109.9 kg)  Height: 5\' 8"  (1.727 m)  Room air.  Initially, SPO2 87%.  Gen: No overt distress HEENT: NCAT, sclerae white Neck: S/P fusion with very limited mobility Lungs: Breath sounds diffusely diminished without wheezes Cardiovascular: RRR, no M Abdomen: Soft, NT, NABS Ext: Symmetric pretibial, ankle, pedal edema.  No clubbing or cyanosis Neuro: no new findings Skin: Limited exam, no lesions noted    DATA: BMP Latest Ref Rng & Units 10/10/2017 10/09/2017 10/08/2017  Glucose 65 - 99 mg/dL 83 91 95  BUN 6 - 20 mg/dL 71(Q) 19(X) 58(I)  Creatinine 0.61 - 1.24 mg/dL 3.25 4.98 2.64  Sodium 135 - 145 mmol/L 127(L) 129(L) 131(L)  Potassium 3.5 - 5.1 mmol/L 4.5 4.6 4.5  Chloride 101 - 111 mmol/L 89(L) 91(L) 91(L)  CO2 22 - 32 mmol/L 33(H) 35(H) 37(H)  Calcium 8.9 - 10.3 mg/dL 1.5(A) 3.0(N) 8.3(L)   CBC Latest Ref Rng & Units 10/05/2017 10/04/2017 10/04/2017  WBC 3.8 - 10.6 K/uL 4.4 6.4 5.9  Hemoglobin 13.0 - 18.0 g/dL 10.6(L) 11.1(L) 11.1(L)  Hematocrit 40.0 -  52.0 % 31.2(L) 33.1(L) 32.3(L)  Platelets 150 - 440 K/uL 123(L) 125(L) 133(L)   CXR: No new film  IMPRESSION: COPD, severe (HCC)  Chronic respiratory failure  with hypoxia and hypercapnia (HCC)  Chronic paralysis of right diaphragm  Cor pulmonale (chronic) (HCC)  Need for prophylactic vaccination and inoculation against influenza - Plan: Flu vaccine HIGH DOSE PF (Fluzone High Dose)   I am concerned about initial SPO2 of 87% upon arrival for this encounter.  He has chronic lower extremity edema which is likely a manifestation of cor pulmonale.  PLAN/REC: Continue Symbicort and Spiriva  Continue albuterol as needed for increased shortness of breath, cough, wheezing, chest tightness.  I encouraged more liberal use  Continue oxygen therapy with sleep and as needed during the day trying to wear a minimum of 20 hours/day.  I especially emphasized the use with sleep and exertion  Follow-up in 4-6 months or sooner as needed  Chad Fischer, MD PCCM service Mobile [phone] Pager [phone] 02/22/2018 1:17 PM

## 2018-02-21 NOTE — Patient Instructions (Addendum)
Continue Symbicort and Spiriva as previously prescribed  I encouraged to increase use of albuterol as needed for increased shortness of breath, cough, wheezing, chest tightness  I encouraged increased use of oxygen.  Continue to wear with sleep and during the day as much as possible trying to wear a minimum of 20 hours/day  Follow-up in 4-6 months or sooner as needed

## 2018-02-23 ENCOUNTER — Emergency Department: Payer: Medicare Other

## 2018-02-23 ENCOUNTER — Inpatient Hospital Stay
Admission: EM | Admit: 2018-02-23 | Discharge: 2018-02-28 | DRG: 871 | Disposition: A | Discharge status: Skilled Nursing Facility | Payer: Medicare Other | Attending: Internal Medicine | Admitting: Internal Medicine

## 2018-02-23 ENCOUNTER — Other Ambulatory Visit: Payer: Self-pay

## 2018-02-23 DIAGNOSIS — J441 Chronic obstructive pulmonary disease with (acute) exacerbation: Secondary | ICD-10-CM | POA: Diagnosis present

## 2018-02-23 DIAGNOSIS — G9341 Metabolic encephalopathy: Secondary | ICD-10-CM | POA: Diagnosis present

## 2018-02-23 DIAGNOSIS — S92252A Displaced fracture of navicular [scaphoid] of left foot, initial encounter for closed fracture: Secondary | ICD-10-CM | POA: Diagnosis present

## 2018-02-23 DIAGNOSIS — I2510 Atherosclerotic heart disease of native coronary artery without angina pectoris: Secondary | ICD-10-CM | POA: Diagnosis present

## 2018-02-23 DIAGNOSIS — J9621 Acute and chronic respiratory failure with hypoxia: Secondary | ICD-10-CM | POA: Diagnosis present

## 2018-02-23 DIAGNOSIS — Y92009 Unspecified place in unspecified non-institutional (private) residence as the place of occurrence of the external cause: Secondary | ICD-10-CM | POA: Diagnosis not present

## 2018-02-23 DIAGNOSIS — Z66 Do not resuscitate: Secondary | ICD-10-CM | POA: Diagnosis present

## 2018-02-23 DIAGNOSIS — Z981 Arthrodesis status: Secondary | ICD-10-CM

## 2018-02-23 DIAGNOSIS — N390 Urinary tract infection, site not specified: Secondary | ICD-10-CM | POA: Diagnosis present

## 2018-02-23 DIAGNOSIS — G8929 Other chronic pain: Secondary | ICD-10-CM | POA: Diagnosis present

## 2018-02-23 DIAGNOSIS — Z7982 Long term (current) use of aspirin: Secondary | ICD-10-CM

## 2018-02-23 DIAGNOSIS — I110 Hypertensive heart disease with heart failure: Secondary | ICD-10-CM | POA: Diagnosis present

## 2018-02-23 DIAGNOSIS — R52 Pain, unspecified: Secondary | ICD-10-CM

## 2018-02-23 DIAGNOSIS — E039 Hypothyroidism, unspecified: Secondary | ICD-10-CM | POA: Diagnosis present

## 2018-02-23 DIAGNOSIS — M21962 Unspecified acquired deformity of left lower leg: Secondary | ICD-10-CM | POA: Diagnosis present

## 2018-02-23 DIAGNOSIS — Z888 Allergy status to other drugs, medicaments and biological substances status: Secondary | ICD-10-CM

## 2018-02-23 DIAGNOSIS — A419 Sepsis, unspecified organism: Secondary | ICD-10-CM | POA: Diagnosis present

## 2018-02-23 DIAGNOSIS — W19XXXA Unspecified fall, initial encounter: Secondary | ICD-10-CM | POA: Diagnosis present

## 2018-02-23 DIAGNOSIS — N3090 Cystitis, unspecified without hematuria: Secondary | ICD-10-CM

## 2018-02-23 DIAGNOSIS — E872 Acidosis: Secondary | ICD-10-CM | POA: Diagnosis present

## 2018-02-23 DIAGNOSIS — Z7951 Long term (current) use of inhaled steroids: Secondary | ICD-10-CM

## 2018-02-23 DIAGNOSIS — Z87891 Personal history of nicotine dependence: Secondary | ICD-10-CM

## 2018-02-23 DIAGNOSIS — M14672 Charcot's joint, left ankle and foot: Secondary | ICD-10-CM | POA: Diagnosis present

## 2018-02-23 DIAGNOSIS — S92212A Displaced fracture of cuboid bone of left foot, initial encounter for closed fracture: Secondary | ICD-10-CM | POA: Diagnosis present

## 2018-02-23 DIAGNOSIS — M1990 Unspecified osteoarthritis, unspecified site: Secondary | ICD-10-CM | POA: Diagnosis present

## 2018-02-23 DIAGNOSIS — I509 Heart failure, unspecified: Secondary | ICD-10-CM | POA: Diagnosis present

## 2018-02-23 DIAGNOSIS — Z9981 Dependence on supplemental oxygen: Secondary | ICD-10-CM

## 2018-02-23 DIAGNOSIS — S82839A Other fracture of upper and lower end of unspecified fibula, initial encounter for closed fracture: Secondary | ICD-10-CM | POA: Diagnosis present

## 2018-02-23 DIAGNOSIS — D696 Thrombocytopenia, unspecified: Secondary | ICD-10-CM | POA: Diagnosis present

## 2018-02-23 DIAGNOSIS — G629 Polyneuropathy, unspecified: Secondary | ICD-10-CM | POA: Diagnosis present

## 2018-02-23 DIAGNOSIS — J9622 Acute and chronic respiratory failure with hypercapnia: Secondary | ICD-10-CM | POA: Diagnosis present

## 2018-02-23 DIAGNOSIS — Z79899 Other long term (current) drug therapy: Secondary | ICD-10-CM

## 2018-02-23 DIAGNOSIS — Z8249 Family history of ischemic heart disease and other diseases of the circulatory system: Secondary | ICD-10-CM

## 2018-02-23 DIAGNOSIS — R0602 Shortness of breath: Secondary | ICD-10-CM

## 2018-02-23 DIAGNOSIS — I6529 Occlusion and stenosis of unspecified carotid artery: Secondary | ICD-10-CM | POA: Diagnosis present

## 2018-02-23 DIAGNOSIS — F329 Major depressive disorder, single episode, unspecified: Secondary | ICD-10-CM | POA: Diagnosis present

## 2018-02-23 DIAGNOSIS — Z79890 Hormone replacement therapy (postmenopausal): Secondary | ICD-10-CM

## 2018-02-23 DIAGNOSIS — R4182 Altered mental status, unspecified: Secondary | ICD-10-CM | POA: Diagnosis present

## 2018-02-23 DIAGNOSIS — Z8679 Personal history of other diseases of the circulatory system: Secondary | ICD-10-CM

## 2018-02-23 DIAGNOSIS — Z7902 Long term (current) use of antithrombotics/antiplatelets: Secondary | ICD-10-CM

## 2018-02-23 DIAGNOSIS — J9601 Acute respiratory failure with hypoxia: Secondary | ICD-10-CM

## 2018-02-23 DIAGNOSIS — Z880 Allergy status to penicillin: Secondary | ICD-10-CM

## 2018-02-23 LAB — URINALYSIS, COMPLETE (UACMP) WITH MICROSCOPIC
Bilirubin Urine: NEGATIVE
Glucose, UA: NEGATIVE mg/dL
KETONES UR: NEGATIVE mg/dL
Nitrite: POSITIVE — AB
PH: 6 (ref 5.0–8.0)
Protein, ur: 100 mg/dL — AB
SPECIFIC GRAVITY, URINE: 1.021 (ref 1.005–1.030)
SQUAMOUS EPITHELIAL / LPF: NONE SEEN (ref 0–5)
WBC, UA: >50 WBC/hpf — ABNORMAL HIGH (ref 0–5)

## 2018-02-23 LAB — BLOOD GAS, VENOUS
Acid-Base Excess: 13.4 mmol/L — ABNORMAL HIGH (ref 0.0–2.0)
BICARBONATE: 42.1 mmol/L — AB (ref 20.0–28.0)
O2 SAT: 71.4 %
PCO2 VEN: 78 mmHg — AB (ref 44.0–60.0)
Patient temperature: 37
pH, Ven: 7.34 (ref 7.250–7.430)
pO2, Ven: 40 mmHg (ref 32.0–45.0)

## 2018-02-23 LAB — COMPREHENSIVE METABOLIC PANEL
ALBUMIN: 4.5 g/dL (ref 3.5–5.0)
ALK PHOS: 64 U/L (ref 38–126)
ALT: 15 U/L (ref 0–44)
ANION GAP: 8 (ref 5–15)
AST: 23 U/L (ref 15–41)
BILIRUBIN TOTAL: 2.3 mg/dL — AB (ref 0.3–1.2)
BUN: 21 mg/dL (ref 8–23)
CALCIUM: 9.2 mg/dL (ref 8.9–10.3)
CO2: 35 mmol/L — ABNORMAL HIGH (ref 22–32)
Chloride: 95 mmol/L — ABNORMAL LOW (ref 98–111)
Creatinine, Ser: 1.09 mg/dL (ref 0.61–1.24)
GFR calc Af Amer: >60 mL/min (ref >60)
GLUCOSE: 110 mg/dL — AB (ref 70–99)
Potassium: 4.2 mmol/L (ref 3.5–5.1)
Sodium: 138 mmol/L (ref 135–145)
TOTAL PROTEIN: 7.1 g/dL (ref 6.5–8.1)

## 2018-02-23 LAB — CBC WITH DIFFERENTIAL/PLATELET
BASOS PCT: 0 %
Basophils Absolute: 0 10*3/uL (ref 0–0.1)
EOS ABS: 0 10*3/uL (ref 0–0.7)
EOS PCT: 0 %
HCT: 33.9 % — ABNORMAL LOW (ref 40.0–52.0)
Hemoglobin: 11.2 g/dL — ABNORMAL LOW (ref 13.0–18.0)
LYMPHS ABS: 0.5 10*3/uL — AB (ref 1.0–3.6)
Lymphocytes Relative: 5 %
MCH: 30.7 pg (ref 26.0–34.0)
MCHC: 33.1 g/dL (ref 32.0–36.0)
MCV: 92.9 fL (ref 80.0–100.0)
MONO ABS: 1.1 10*3/uL — AB (ref 0.2–1.0)
MONOS PCT: 10 %
Neutro Abs: 9.4 10*3/uL — ABNORMAL HIGH (ref 1.4–6.5)
Neutrophils Relative %: 85 %
PLATELETS: 129 10*3/uL — AB (ref 150–440)
RBC: 3.64 MIL/uL — ABNORMAL LOW (ref 4.40–5.90)
RDW: 15.6 % — ABNORMAL HIGH (ref 11.5–14.5)
WBC: 11.1 10*3/uL — ABNORMAL HIGH (ref 3.8–10.6)

## 2018-02-23 LAB — BLOOD GAS, ARTERIAL
Acid-Base Excess: 10.3 mmol/L — ABNORMAL HIGH (ref 0.0–2.0)
BICARBONATE: 38.1 mmol/L — AB (ref 20.0–28.0)
Expiratory PAP: 5
FIO2: 35
Inspiratory PAP: 12
O2 Saturation: 88.8 %
PATIENT TEMPERATURE: 37
PO2 ART: 59 mmHg — AB (ref 83.0–108.0)
pCO2 arterial: 69 mmHg (ref 32.0–48.0)
pH, Arterial: 7.35 (ref 7.350–7.450)

## 2018-02-23 LAB — GLUCOSE, CAPILLARY: Glucose-Capillary: 186 mg/dL — ABNORMAL HIGH (ref 70–99)

## 2018-02-23 LAB — PROCALCITONIN

## 2018-02-23 LAB — LIPASE, BLOOD: LIPASE: 19 U/L (ref 11–51)

## 2018-02-23 LAB — TROPONIN I: Troponin I: 0.03 ng/mL (ref <0.03)

## 2018-02-23 LAB — PROTIME-INR
INR: 1.09
Prothrombin Time: 14 seconds (ref 11.4–15.2)

## 2018-02-23 LAB — APTT: aPTT: 41 seconds — ABNORMAL HIGH (ref 24–36)

## 2018-02-23 LAB — LACTIC ACID, PLASMA: Lactic Acid, Venous: 1.4 mmol/L (ref 0.5–1.9)

## 2018-02-23 MED ORDER — TIOTROPIUM BROMIDE MONOHYDRATE 18 MCG IN CAPS
18.0000 ug | ORAL_CAPSULE | Freq: Every day | RESPIRATORY_TRACT | Status: DC
  Administered 2018-02-24 – 2018-02-28 (×5): 18 ug via RESPIRATORY_TRACT
  Filled 2018-02-23 (×2): qty 5

## 2018-02-23 MED ORDER — MAGNESIUM SULFATE 2 GM/50ML IV SOLN
2.0000 g | INTRAVENOUS | Status: AC
  Administered 2018-02-23: 2 g via INTRAVENOUS
  Filled 2018-02-23: qty 50

## 2018-02-23 MED ORDER — ADULT MULTIVITAMIN W/MINERALS CH
1.0000 | ORAL_TABLET | Freq: Every day | ORAL | Status: DC
  Administered 2018-02-24 – 2018-02-28 (×5): 1 via ORAL
  Filled 2018-02-23 (×5): qty 1

## 2018-02-23 MED ORDER — SODIUM CHLORIDE 0.9 % IV SOLN
INTRAVENOUS | Status: DC
  Administered 2018-02-23 – 2018-02-25 (×2): via INTRAVENOUS

## 2018-02-23 MED ORDER — MOMETASONE FURO-FORMOTEROL FUM 200-5 MCG/ACT IN AERO
2.0000 | INHALATION_SPRAY | Freq: Two times a day (BID) | RESPIRATORY_TRACT | Status: DC
  Administered 2018-02-24 – 2018-02-28 (×9): 2 via RESPIRATORY_TRACT
  Filled 2018-02-23: qty 8.8

## 2018-02-23 MED ORDER — METHYLPREDNISOLONE SODIUM SUCC 125 MG IJ SOLR: 60.0000 mg | Freq: Every day | INTRAMUSCULAR | Status: DC

## 2018-02-23 MED ORDER — SODIUM CHLORIDE 0.9 % IV BOLUS
1000.0000 mL | Freq: Once | INTRAVENOUS | Status: AC
  Administered 2018-02-23: 1000 mL via INTRAVENOUS

## 2018-02-23 MED ORDER — BACLOFEN 10 MG PO TABS
10.0000 mg | ORAL_TABLET | Freq: Two times a day (BID) | ORAL | Status: DC | PRN
  Administered 2018-02-26: 10 mg via ORAL
  Filled 2018-02-23 (×4): qty 1

## 2018-02-23 MED ORDER — IPRATROPIUM-ALBUTEROL 0.5-2.5 (3) MG/3ML IN SOLN
3.0000 mL | Freq: Once | RESPIRATORY_TRACT | Status: AC
  Administered 2018-02-23: 3 mL via RESPIRATORY_TRACT
  Filled 2018-02-23: qty 6

## 2018-02-23 MED ORDER — ASPIRIN EC 81 MG PO TBEC
81.0000 mg | DELAYED_RELEASE_TABLET | Freq: Every day | ORAL | Status: DC
  Administered 2018-02-24 – 2018-02-28 (×5): 81 mg via ORAL
  Filled 2018-02-23 (×5): qty 1

## 2018-02-23 MED ORDER — ENOXAPARIN SODIUM 40 MG/0.4ML ~~LOC~~ SOLN
40.0000 mg | SUBCUTANEOUS | Status: DC
  Administered 2018-02-23: 40 mg via SUBCUTANEOUS
  Filled 2018-02-23: qty 0.4

## 2018-02-23 MED ORDER — VANCOMYCIN HCL IN DEXTROSE 1-5 GM/200ML-% IV SOLN
1000.0000 mg | Freq: Once | INTRAVENOUS | Status: AC
  Administered 2018-02-23: 1000 mg via INTRAVENOUS
  Filled 2018-02-23: qty 200

## 2018-02-23 MED ORDER — HALOPERIDOL LACTATE 5 MG/ML IJ SOLN
INTRAMUSCULAR | Status: AC
  Filled 2018-02-23: qty 1

## 2018-02-23 MED ORDER — HALOPERIDOL LACTATE 5 MG/ML IJ SOLN
2.0000 mg | Freq: Once | INTRAMUSCULAR | Status: AC
  Administered 2018-02-23: 2 mg via INTRAVENOUS

## 2018-02-23 MED ORDER — METHYLPREDNISOLONE SODIUM SUCC 40 MG IJ SOLR
40.0000 mg | Freq: Three times a day (TID) | INTRAMUSCULAR | Status: DC
  Administered 2018-02-24 – 2018-02-28 (×15): 40 mg via INTRAVENOUS
  Filled 2018-02-23 (×15): qty 1

## 2018-02-23 MED ORDER — CLOPIDOGREL BISULFATE 75 MG PO TABS
75.0000 mg | ORAL_TABLET | Freq: Every day | ORAL | Status: DC
  Administered 2018-02-24 – 2018-02-28 (×5): 75 mg via ORAL
  Filled 2018-02-23 (×5): qty 1

## 2018-02-23 MED ORDER — ALBUTEROL SULFATE (2.5 MG/3ML) 0.083% IN NEBU
5.0000 mg | INHALATION_SOLUTION | Freq: Once | RESPIRATORY_TRACT | Status: AC
  Administered 2018-02-23: 5 mg via RESPIRATORY_TRACT
  Filled 2018-02-23: qty 6

## 2018-02-23 MED ORDER — SODIUM CHLORIDE 0.9 % IV SOLN
500.0000 mg | Freq: Once | INTRAVENOUS | Status: AC
  Administered 2018-02-23: 500 mg via INTRAVENOUS
  Filled 2018-02-23: qty 500

## 2018-02-23 MED ORDER — METOPROLOL TARTRATE 25 MG PO TABS
25.0000 mg | ORAL_TABLET | Freq: Two times a day (BID) | ORAL | Status: DC
  Administered 2018-02-24 – 2018-02-28 (×9): 25 mg via ORAL
  Filled 2018-02-23 (×9): qty 1

## 2018-02-23 MED ORDER — METHYLPREDNISOLONE SODIUM SUCC 125 MG IJ SOLR
125.0000 mg | Freq: Once | INTRAMUSCULAR | Status: AC
  Administered 2018-02-23: 125 mg via INTRAVENOUS
  Filled 2018-02-23: qty 2

## 2018-02-23 MED ORDER — SODIUM CHLORIDE 0.9 % IV SOLN
1.0000 g | INTRAVENOUS | Status: DC
  Filled 2018-02-23: qty 10

## 2018-02-23 MED ORDER — LEVOTHYROXINE SODIUM 100 MCG PO TABS
200.0000 ug | ORAL_TABLET | Freq: Every day | ORAL | Status: DC
  Administered 2018-02-24 – 2018-02-28 (×5): 200 ug via ORAL
  Filled 2018-02-23 (×5): qty 2

## 2018-02-23 MED ORDER — IPRATROPIUM-ALBUTEROL 0.5-2.5 (3) MG/3ML IN SOLN
3.0000 mL | RESPIRATORY_TRACT | Status: DC | PRN
  Administered 2018-02-26 (×2): 3 mL via RESPIRATORY_TRACT
  Filled 2018-02-23 (×2): qty 3

## 2018-02-23 MED ORDER — AMLODIPINE BESYLATE 5 MG PO TABS
5.0000 mg | ORAL_TABLET | Freq: Every day | ORAL | Status: DC
  Administered 2018-02-24 – 2018-02-28 (×5): 5 mg via ORAL
  Filled 2018-02-23 (×5): qty 1

## 2018-02-23 MED ORDER — SODIUM CHLORIDE 0.9 % IV SOLN
1.0000 g | Freq: Once | INTRAVENOUS | Status: AC
  Administered 2018-02-23: 1 g via INTRAVENOUS
  Filled 2018-02-23: qty 10

## 2018-02-23 MED ORDER — IOHEXOL 350 MG/ML SOLN
75.0000 mL | Freq: Once | INTRAVENOUS | Status: AC | PRN
  Administered 2018-02-23: 75 mL via INTRAVENOUS

## 2018-02-23 NOTE — ED Notes (Signed)
Pts brother Chad Done "[name]" given update on pt. Pt gave consent for

## 2018-02-23 NOTE — ED Triage Notes (Addendum)
Pt to ED via [facility]. EMS states pt was found on front porch naked with O2 cord stuck in door frame, O2 70% placed on 2.5L to 90%. Pt states he fell last night called EMS but denied being transported bc he wasn't in pain. Deformity to left wrist and left ankle, small abrassions from head to legs. EMS vitals 153/123 101 pulse, 124 CBG, 98.7 temp hot to the touch.

## 2018-02-23 NOTE — ED Notes (Signed)
Date and time results received: 02/23/18 1140 (use smartphrase ".now" to insert current time)  Test: troponin Critical Value: 0.03  Name of Provider Notified: Scotty Court

## 2018-02-23 NOTE — H&P (Addendum)
[facility] - [place] at [facility]   PATIENT NAME: Chad Huffman    MR#:  [id number]  DATE OF BIRTH:  Jul 30, 1945  DATE OF ADMISSION:  02/23/2018  PRIMARY CARE PHYSICIAN: Dorothey Baseman, MD   REQUESTING/REFERRING PHYSICIAN: dr Scotty Court  CHIEF COMPLAINT:   Altered mental status patient is a poor historian. No family in the ER. Spoke briefly with brother on the phone HISTORY OF PRESENT ILLNESS:  Chad Huffman  is a 72 y.o. male with a known history of chronic respiratory failure on chronic home oxygen, CHF, peripheral neuropathy, h/o difficult airway /intubation due to cervical spine fusion and h/o tracheostomy in the past comes to the emergency room after he was found by neighbors standing naked on his porch and confused. EMS was called the found his sats in the 70s. They also noted patient's oxygen tubing was kinked and stuck in his door. Found to have some deformities in his left foot with no bruising on his left foot. Cording to patient's brother a friend informed that patient's coffee table was knocked out in the house and there is a possibility patient may have fallen.  Pt is not able to give me any history or review of systems. He is screaming for help.  He was found to have elevated white count of 11,000 along with tachycardia and abnormal UA. CT head was negative. CT left foot negative for any trauma. Chronic changes of Charcot's joint noted.  Received IV Rocephin, Zosyn, vancomycin and or dose of IV Solu-Medrol 125 mg.  PAST MEDICAL HISTORY:   Past Medical History:  Diagnosis Date  . Antalgic gait   . Carotid artery occlusion   . CHF (congestive heart failure) (HCC)   . Chronic neck and back pain   . Complication of anesthesia    pt has had cervical fusion-limited neck flextion  . COPD (chronic obstructive pulmonary disease) (HCC)   . Depression   . DJD (degenerative joint disease)    Right shoulder  . Hypertension   . Lumbago   . Peripheral  neuropathy     PAST SURGICAL HISTOIRY:   Past Surgical History:  Procedure Laterality Date  . ABDOMINAL AORTIC ANEURYSM REPAIR  2009   Endovascular AAA repair  . ABDOMINAL AORTIC ANEURYSM REPAIR    . CAROTID ANGIOGRAPHY  09/09/2016   Procedure: Carotid Angiography;  Surgeon: Nada Libman, MD;  Location: [facility] INVASIVE CV LAB;  Service: Cardiovascular;;  rt. carotid  . CARPAL TUNNEL RELEASE     bilateral CTS  . ESOPHAGOGASTRODUODENOSCOPY (EGD) WITH PROPOFOL N/A 09/28/2016   Procedure: ESOPHAGOGASTRODUODENOSCOPY (EGD) WITH PROPOFOL;  Surgeon: Midge Minium, MD;  Location: [facility] ENDOSCOPY;  Service: Endoscopy;  Laterality: N/A;  . HAMMER TOE SURGERY  12/10/2011   Procedure: HAMMER TOE CORRECTION;  Surgeon: Ernestene Kiel, DPM;  Location: [facility];  Service: Podiatry;  Laterality: Right;  Right hammertoe repair second right with 2.5x42 orthopro screw   . IR GENERIC HISTORICAL  04/05/2016   IR GASTROSTOMY TUBE MOD SED 04/05/2016 [facility]  . IR GENERIC HISTORICAL  05/12/2016   IR CM INJ ANY COLONIC TUBE W/FLUORO 05/12/2016 Malachy Moan, MD [facility]  . IR GENERIC HISTORICAL  05/26/2016   IR CM INJ ANY COLONIC TUBE W/FLUORO 05/26/2016 [facility]  . IR GENERIC HISTORICAL  05/28/2016   IR REPLC GASTRO/COLONIC TUBE PERCUT W/FLUORO 05/28/2016 Brayton El, PA-C [facility]INTERV RAD  . IR GENERIC HISTORICAL  07/02/2016   IR GASTROSTOMY TUBE REMOVAL 07/02/2016 Brayton El, PA-C [facility]INTERV  RAD  . METATARSAL OSTEOTOMY  12/10/2011   Procedure: METATARSAL OSTEOTOMY;  Surgeon: Ernestene Kiel, DPM;  Location: [facility];  Service: Podiatry;  Laterality: Right;  Right Keller arthroplasty with size 5 silicone implant right great toe; second metatarsal osteotomy with 2.0x14 screw  . NECK SURGERY    . PERIPHERAL VASCULAR CATHETERIZATION N/A 02/11/2015   Procedure: Carotid Angiography;  Surgeon: Nada Libman, MD;  Location: [facility] INVASIVE CV LAB;  Service: Cardiovascular;  Laterality:  N/A;  . PILONIDAL CYST / SINUS EXCISION    . Rotary cuff    . SPINE SURGERY  09/15/11   Scar tissue removed- back  . SPINE SURGERY  09/15/11   Scar tissue removed- back  . TONSILLECTOMY    . TRACHEOSTOMY TUBE PLACEMENT N/A 03/03/2016   Procedure: TRACHEOSTOMY;  Surgeon: Geanie Logan, MD;  Location: [facility] ORS;  Service: ENT;  Laterality: N/A;    SOCIAL HISTORY:   Social History   Tobacco Use  . Smoking status: Former Smoker    Packs/day: 1.00    Years: 30.00    Pack years: 30.00    Types: Cigarettes    Last attempt to quit: 08/30/2006    Years since quitting: 11.4  . Smokeless tobacco: Never Used  Substance Use Topics  . Alcohol use: Yes    Alcohol/week: 2.0 standard drinks    Types: 2 Cans of beer per week    Comment: occasional use    FAMILY HISTORY:   Family History  Problem Relation Age of Onset  . Stomach cancer Mother   . Hypertension Brother   . Heart attack Father     DRUG ALLERGIES:   Allergies  Allergen Reactions  . Ampicillin Swelling and Other (See Comments)    Unable to obtain enough information to answer additional questions about this medication.  Has patient had a PCN reaction causing immediate rash, facial/tongue/throat swelling, SOB or lightheadedness with hypotension: Yes Has patient had a PCN reaction causing severe rash involving mucus membranes or skin necrosis: No Has patient had a PCN reaction that required hospitalization No Has patient had a PCN reaction occurring within the last 10 years: Yes If all of the above answers are "NO", then may pr  . Nortriptyline Other (See Comments)    Sleep walking  . Brimonidine Other (See Comments)    Unknown reaction per MAR   . Cyclobenzaprine Other (See Comments)    Hallucinations  . Tizanidine Other (See Comments)    Hallucinations  . Gabapentin Rash    REVIEW OF SYSTEMS:  Review of Systems  Unable to perform ROS: Mental status change     MEDICATIONS AT HOME:   Prior to Admission medications    Medication Sig Start Date End Date Taking? Authorizing Provider  amLODipine (NORVASC) 5 MG tablet Take 5 mg by mouth daily. 09/29/17  Yes [provider]  aspirin EC 81 MG tablet Take 81 mg by mouth daily.   Yes [provider]  clopidogrel (PLAVIX) 75 MG tablet TAKE 1 TABLET BY MOUTH EVERY DAY 09/20/17  Yes Chuck Hint, MD  desonide (DESOWEN) 0.05 % cream APPLY TO AFFECTED AREA TWICE A DAY 11/03/17  Yes [provider]  [place] Lanius Omega-3 500 MG CAPS Take 1 tablet by mouth daily.   Yes [provider]  levothyroxine (SYNTHROID, LEVOTHROID) 200 MCG tablet Take 200 mcg by mouth daily before breakfast.   Yes [provider]  metoprolol tartrate (LOPRESSOR) 25 MG tablet Take 1 tablet (25 mg  total) by mouth 2 (two) times daily. 07/27/16  Yes Katharina Caper, MD  Multiple Vitamin (MULTIVITAMIN WITH MINERALS) TABS tablet Take 1 tablet by mouth daily.   Yes [provider]  OXYGEN 4 L/min by Intratracheal route as needed (for shortness of breath).    Yes [provider]  SYMBICORT 160-4.5 MCG/ACT inhaler TAKE 2 PUFFS BY MOUTH TWICE A DAY 01/18/18  Yes Merwyn Katos, MD  tiotropium (SPIRIVA) 18 MCG inhalation capsule Place 1 capsule (18 mcg total) into inhaler and inhale daily. 10/11/17  Yes Auburn Bilberry, MD  baclofen (LIORESAL) 10 MG tablet Take 10 mg by mouth 2 (two) times daily as needed for muscle spasms.     [provider]  clotrimazole-betamethasone (LOTRISONE) cream Apply 1 application topically 2 (two) times daily. 01/02/18   Fisher, Roselyn Bering, PA-C  furosemide (LASIX) 20 MG tablet Take 1 tablet (20 mg total) by mouth daily. 09/13/17 02/21/18  Milagros Loll, MD  ipratropium-albuterol (DUONEB) 0.5-2.5 (3) MG/3ML SOLN Take 3 mLs by nebulization every 6 (six) hours as needed. Dx:J44.9 12/21/16   Merwyn Katos, MD      VITAL SIGNS:  Blood pressure (!) 174/88, pulse 99, temperature 98.3 F (36.8 C), temperature source  Oral, resp. rate 19, height 5\' 10"  (1.778 m), weight 110.5 kg, SpO2 94 %.  PHYSICAL EXAMINATION:  GENERAL:  72 y.o.-year-old patient lying in the bed with no acute distress. Appears chronically and critically ill EYES: Pupils equal, round, reactive to light and accommodation. No scleral icterus. Extraocular muscles intact.  HEENT: Head atraumatic, normocephalic. Oropharynx and nasopharynx clear.  NECK:  Supple, no jugular venous distention. No thyroid enlargement, no tenderness. Previous tracheostomy LUNGS: shallow breath sounds bilaterally, no wheezing, rales,rhonchi or crepitation. No use of accessory muscles of respiration.  CARDIOVASCULAR: S1, S2 normal. No murmurs, rubs, or gallops. tachycardia ABDOMEN: Soft, nontender, nondistended. Bowel sounds present. No organomegaly or mass.  EXTREMITIES: chronic left lower extremity foot deformity with bruises present dry skin. Previous foot surgery changes noted NEUROLOGIC: to assess since patient is quite lethargic however moves all extremities  PSYCHIATRIC: lethargic and  very confused SKIN: No obvious rash, lesion, or ulcer.   LABORATORY PANEL:   CBC Recent Labs  Lab 02/23/18 1053  WBC 11.1*  HGB 11.2*  HCT 33.9*  PLT 129*   ------------------------------------------------------------------------------------------------------------------  Chemistries  Recent Labs  Lab 02/23/18 1053  NA 138  K 4.2  CL 95*  CO2 35*  GLUCOSE 110*  BUN 21  CREATININE 1.09  CALCIUM 9.2  AST 23  ALT 15  ALKPHOS 64  BILITOT 2.3*   ------------------------------------------------------------------------------------------------------------------  Cardiac Enzymes Recent Labs  Lab 02/23/18 1053  TROPONINI 0.03*   ------------------------------------------------------------------------------------------------------------------  RADIOLOGY:  Dg Wrist Complete Left  Result Date: 02/23/2018 CLINICAL DATA:  LEFT wrist swelling post fall EXAM:  LEFT WRIST - COMPLETE 3+ VIEW COMPARISON:  03/13/2017 FINDINGS: Osseous demineralization. Radiocarpal and midcarpal joint space narrowing. Chronic dorsal dislocation of the lunate. Extensive soft tissue swelling at wrist. No acute fracture, additional dislocation or bone destruction. Spur formation at the volar margin of the radiocarpal joint. IMPRESSION: Chronic dorsal lunate dislocation. Extensive degenerative changes and osseous demineralization. No definite acute fracture identified. Electronically Signed   By: Ulyses Southward M.D.   On: 02/23/2018 12:46   Ct Head Wo Contrast  Result Date: 02/23/2018 CLINICAL DATA:  Altered LOC EXAM: CT HEAD WITHOUT CONTRAST TECHNIQUE: Contiguous axial images were obtained from the base of the skull through the vertex without intravenous contrast. COMPARISON:  10/04/2017 FINDINGS: Brain: Motion degraded study. Artifact over the posterior fossa from hardware. No acute territorial infarction, hemorrhage or intracranial mass. Encephalomalacia in the right frontal lobe. Atrophy. Mild small vessel ischemic changes of the white matter. Probable old lacunar infarct in the left cerebellum. Vascular: No hyperdense vessels. Carotid vascular and vertebral artery calcification Skull: No fracture Sinuses/Orbits: No acute finding. Other: None IMPRESSION: 1. Motion degraded study. No CT evidence for acute intracranial abnormality. 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 Atrophy and mild small vessel ischemic changes of the white matter Electronically Signed   By: Jasmine Pang M.D.   On: 02/23/2018 16:40   Ct Angio Chest Pe W And/or Wo Contrast  Result Date: 02/23/2018 CLINICAL DATA:  72 year old male with acute shortness of breath, recent fall, hypoxia and abdominal and pelvic discomfort. EXAM: CT ANGIOGRAPHY CHEST CT ABDOMEN AND PELVIS WITH CONTRAST TECHNIQUE: Multidetector CT imaging of the chest was performed using the standard protocol during bolus administration of intravenous contrast.  Multiplanar CT image reconstructions and MIPs were obtained to evaluate the vascular anatomy. Multidetector CT imaging of the abdomen and pelvis was performed using the standard protocol during bolus administration of intravenous contrast. CONTRAST:  75mL OMNIPAQUE IOHEXOL 350 MG/ML SOLN COMPARISON:  10/09/2017 chest CT, 04/16/2015 abdomen/pelvic CT and other studies FINDINGS: CTA CHEST FINDINGS Cardiovascular: This is a technically borderline study due to respiratory motion artifact and borderline contrast opacification of the pulmonary arteries. No central, large or definite pulmonary emboli identified. Prominent main pulmonary artery again noted. Cardiomegaly identified without pericardial effusion. Coronary artery and aortic atherosclerotic calcification noted without aortic aneurysm. Mediastinum/Nodes: No enlarged mediastinal, hilar, or axillary lymph nodes. Thyroid gland, trachea, and esophagus demonstrate no significant findings. Lungs/Pleura: RIGHT LOWER lobe scarring/atelectasis again noted. No new pulmonary opacities are identified. No pleural effusion or pneumothorax. Musculoskeletal: No acute or suspicious bony abnormality identified. Posterior fusion hardware in the cervical/thoracic spine again noted. Review of the MIP images confirms the above findings. CT ABDOMEN and PELVIS FINDINGS Hepatobiliary: No hepatic abnormalities identified. Cholelithiasis again identified without CT evidence of acute cholecystitis. No biliary dilatation. Pancreas: Mildly atrophic without other significant abnormality Spleen: Unremarkable Adrenals/Urinary Tract: The kidneys and adrenal glands are unremarkable. Mild circumferential wall thickening of the bladder again noted. No hydronephrosis or urinary calculi. Stomach/Bowel: Stomach is within normal limits. No evidence of bowel wall thickening, distention, or inflammatory changes. Colonic diverticulosis noted without evidence of diverticulitis. A moderate to large amount of  stool within the distal sigmoid colon and rectum noted. Vascular/Lymphatic: Aorto bi-iliac endograft appears unchanged. Abdominal aorta again measures 3.5 cm in greatest diameter. No enlarged lymph nodes identified. Reproductive: Prostate calcifications again noted. Other: No free fluid, abscess or pneumoperitoneum. Musculoskeletal: No acute or suspicious bony abnormalities identified. Posterior fusion changes at L4-L5 again noted. Review of the MIP images confirms the above findings. IMPRESSION: 1. No evidence of acute abnormality. No pulmonary emboli identified but the study is technically borderline. 2. Cholelithiasis without CT evidence of acute cholecystitis. 3. Aorto bi-iliac endograft again noted without change. 4. Continued RIGHT LOWER lobe atelectasis/scarring. 5. Cardiomegaly and prominent main pulmonary artery/pulmonary arterial hypertension 6. Coronary artery and aortic Atherosclerosis (ICD10-I70.0). Electronically Signed   By: Harmon Pier M.D.   On: 02/23/2018 13:21   Ct Abdomen Pelvis W Contrast  Result Date: 02/23/2018 CLINICAL DATA:  72 year old male with acute shortness of breath, recent fall, hypoxia and abdominal and pelvic discomfort. EXAM: CT ANGIOGRAPHY CHEST CT ABDOMEN AND PELVIS WITH CONTRAST TECHNIQUE: Multidetector CT imaging of the chest  was performed using the standard protocol during bolus administration of intravenous contrast. Multiplanar CT image reconstructions and MIPs were obtained to evaluate the vascular anatomy. Multidetector CT imaging of the abdomen and pelvis was performed using the standard protocol during bolus administration of intravenous contrast. CONTRAST:  75mL OMNIPAQUE IOHEXOL 350 MG/ML SOLN COMPARISON:  10/09/2017 chest CT, 04/16/2015 abdomen/pelvic CT and other studies FINDINGS: CTA CHEST FINDINGS Cardiovascular: This is a technically borderline study due to respiratory motion artifact and borderline contrast opacification of the pulmonary arteries. No central,  large or definite pulmonary emboli identified. Prominent main pulmonary artery again noted. Cardiomegaly identified without pericardial effusion. Coronary artery and aortic atherosclerotic calcification noted without aortic aneurysm. Mediastinum/Nodes: No enlarged mediastinal, hilar, or axillary lymph nodes. Thyroid gland, trachea, and esophagus demonstrate no significant findings. Lungs/Pleura: RIGHT LOWER lobe scarring/atelectasis again noted. No new pulmonary opacities are identified. No pleural effusion or pneumothorax. Musculoskeletal: No acute or suspicious bony abnormality identified. Posterior fusion hardware in the cervical/thoracic spine again noted. Review of the MIP images confirms the above findings. CT ABDOMEN and PELVIS FINDINGS Hepatobiliary: No hepatic abnormalities identified. Cholelithiasis again identified without CT evidence of acute cholecystitis. No biliary dilatation. Pancreas: Mildly atrophic without other significant abnormality Spleen: Unremarkable Adrenals/Urinary Tract: The kidneys and adrenal glands are unremarkable. Mild circumferential wall thickening of the bladder again noted. No hydronephrosis or urinary calculi. Stomach/Bowel: Stomach is within normal limits. No evidence of bowel wall thickening, distention, or inflammatory changes. Colonic diverticulosis noted without evidence of diverticulitis. A moderate to large amount of stool within the distal sigmoid colon and rectum noted. Vascular/Lymphatic: Aorto bi-iliac endograft appears unchanged. Abdominal aorta again measures 3.5 cm in greatest diameter. No enlarged lymph nodes identified. Reproductive: Prostate calcifications again noted. Other: No free fluid, abscess or pneumoperitoneum. Musculoskeletal: No acute or suspicious bony abnormalities identified. Posterior fusion changes at L4-L5 again noted. Review of the MIP images confirms the above findings. IMPRESSION: 1. No evidence of acute abnormality. No pulmonary emboli  identified but the study is technically borderline. 2. Cholelithiasis without CT evidence of acute cholecystitis. 3. Aorto bi-iliac endograft again noted without change. 4. Continued RIGHT LOWER lobe atelectasis/scarring. 5. Cardiomegaly and prominent main pulmonary artery/pulmonary arterial hypertension 6. Coronary artery and aortic Atherosclerosis (ICD10-I70.0). Electronically Signed   By: Harmon Pier M.D.   On: 02/23/2018 13:21   Ct Foot Left Wo Contrast  Result Date: 02/23/2018 CLINICAL DATA:  Foot swelling.  Possible fall today. EXAM: CT OF THE LEFT FOOT WITHOUT CONTRAST TECHNIQUE: Multidetector CT imaging of the left foot was performed according to the standard protocol. Multiplanar CT image reconstructions were also generated. COMPARISON:  Radiographs dated 02/23/2018 and 10/08/2013 and 03/03/2016 FINDINGS: Bones/Joint/Cartilage There is fragmentation and subluxation of the navicular as well as fragmentation and subluxation of the cuboid at the calcaneocuboid joint as well as fragmentation and impaction calcaneus at the calcaneocuboid joint. 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 Patient has had a previous joint replacement at the first MTP joint. Ligaments Suboptimally assessed by CT. No discrete ligamentous disruption at the ankle. Muscles and Tendons The tendons around the ankle are intact. Achilles tendon and plantar fascia are intact. Soft tissues Arterial vascular calcifications in the foot and around the ankle. Diffuse subcutaneous edema of the foot and ankle. IMPRESSION: 1. The fragmentation and subluxations of the cuboid and navicular are consistent with Charcot joints. I do not think that the findings are consistent with osteomyelitis. 2. Some of the fragmentation/fractures could be acute or subacute but  the appearance is most consistent with chronic Charcot joints. 3. Diffuse subcutaneous edema is nonspecific. Electronically Signed   By: Francene Boyers M.D.   On: 02/23/2018  17:19   Dg Chest Port 1 View  Result Date: 02/23/2018 CLINICAL DATA:  Altered mental status EXAM: PORTABLE CHEST 1 VIEW COMPARISON:  10/04/2017 chest radiograph. FINDINGS: Surgical hardware overlying the cervical spine is partially visualized. Stable cardiomediastinal silhouette with mild cardiomegaly. No pneumothorax. No pleural effusion. Borderline mild pulmonary edema. Stable elevation of the right hemidiaphragm with mild right basilar scarring versus atelectasis. IMPRESSION: 1. Borderline mild congestive heart failure. 2. Mild right basilar scarring versus atelectasis. Electronically Signed   By: Delbert Phenix M.D.   On: 02/23/2018 12:19   Dg Foot Complete Left  Result Date: 02/23/2018 CLINICAL DATA:  LEFT foot and LEFT wrist swelling after fall, LEFT foot bruising EXAM: LEFT FOOT - COMPLETE 3+ VIEW COMPARISON:  10/08/2013 FINDINGS: Marked dorsal soft tissue swelling LEFT foot. Osseous demineralization. Prior joint replacement surgery at first MTP joint. Small bone fragments are identified at the expected position of the dorsal margin of the tarsal navicular. Tarsal navicular however is dislocated inferiorly. In addition, the proximal margin of the cuboid is poorly defined and questionably destroyed versus fractured though no definite bone fragment is seen. These changes could reflect traumatic injuries at the midfoot or Charcot changes. No additional fracture or dislocation identified. IMPRESSION: Inferior dislocation of the tarsal navicular at the talonavicular joint with small bone fragments at the cranial margin question avulsion injury at the talonavicular ligament/capsular insertion. Questionable destruction or fracture at the proximal margin of the cuboid laterally. These changes could reflect acute trauma or Charcot changes; further evaluation by CT recommended. Electronically Signed   By: Ulyses Southward M.D.   On: 02/23/2018 12:33    EKG:    IMPRESSION AND PLAN:  Chad Huffman  is a 72 y.o. male  with a known history of chronic respiratory failure on chronic home oxygen, CHF, peripheral neuropathy comes to the emergency room after he was found by neighbors standing naked on his porch and confused. EMS was called the found his sats in the 70s. They also noted patient's oxygen tubing was kinked and stuck in his door. Found to have some deformities in his left foot with no bruising on his left foot. Cording to patient's brother a friend informed that patient's coffee table was knocked out in the house and there is a possibility patient may have fallen.  1. acute encephalopathy/altered mental status in the setting of hypoxia acute on chronic hypercapnia hypoxic respiratory failure secondary to COPD exacerbation along with UTI -admit to ICU. Spoke with ICU attending -BiPAP -IV Solu-Medrol, nebulizer and inhalers  2. sepsis due to UTI -IV Rocephin -chest x-ray negative for pneumonia -follow blood culture and urine culture  3. Chronic left foot deformity with significant amount of bruising possibly superficial trauma from suspected fall at home -CT left foot no fracture -chronic changes of Charcot's joint  4. Hypertension continue home meds  5. DVT prophylaxis subcu Lovenox  Discussed with patient's brother and healthcare power of attorney Chad Huffman  All the records are reviewed and case discussed with ED provider. Management plans discussed with the patient, family and they are in agreement.  CODE STATUS: DNR  TOTAL TIME TAKING CARE OF THIS PATIENT: *50* minutes.    Enedina Finner M.D on 02/23/2018 at 5:35 PM  Between 7am to 6pm - Pager - [phone]  After 6pm go to [email or web address] - password  [facility]  [facility]  Office  [phone]  CC: Primary care physician; Dorothey Baseman, MD

## 2018-02-23 NOTE — ED Notes (Signed)
Patient transported to CT 

## 2018-02-23 NOTE — ED Notes (Signed)
Pt head of penis small amount of dried blood noted after catheretization. Pt voiding without difficulty.

## 2018-02-23 NOTE — ED Notes (Signed)
Per RT ph 7.34 pco2 78 MD notified

## 2018-02-23 NOTE — ED Provider Notes (Signed)
[facility] Emergency Department Provider Note  ____________________________________________  Time seen: Approximately 3:37 PM  I have reviewed the triage vital signs and the nursing notes.   HISTORY  Chief Complaint Altered Mental Status  Level 5 Caveat: Portions of the History and Physical including HPI and review of systems are unable to be completely obtained due to patient being a poor historian due to altered mental status   HPI Chad Huffman is a 72 y.o. male with a history of CHF COPD and hypertension who was found by neighbors today to be standing naked on his porch and confused.  EMS report that on their arrival the patient's oxygen tubing was kinked and stuck in his door and that he seemed to have deformities of his left foot and left wrist.  The patient unable to answer questions appropriately.  He states "I need more oxygen"      Past Medical History:  Diagnosis Date  . Antalgic gait   . Carotid artery occlusion   . CHF (congestive heart failure) (HCC)   . Chronic neck and back pain   . Complication of anesthesia    pt has had cervical fusion-limited neck flextion  . COPD (chronic obstructive pulmonary disease) (HCC)   . Depression   . DJD (degenerative joint disease)    Right shoulder  . Hypertension   . Lumbago   . Peripheral neuropathy      Patient Active Problem List   Diagnosis Date Noted  . Respiratory failure (HCC) 10/04/2017  . PNA (pneumonia) 09/09/2017  . Adjustment disorder with mixed anxiety and depressed mood   . Hyponatremia 06/24/2017  . Hematemesis without nausea   . Pressure injury of skin 09/14/2016  . Weakness 09/13/2016  . UTI (urinary tract infection) 09/13/2016  . Chronic diastolic CHF (congestive heart failure) (HCC) 09/13/2016  . COPD (chronic obstructive pulmonary disease) (HCC) 09/13/2016  . Carotid stenosis 09/09/2016  . Carotid artery stenosis 08/23/2016  . Pancytopenia (HCC) 08/23/2016  .  Hyperkalemia 08/23/2016  . Leukocytosis 07/27/2016  . Thrombocytopenia (HCC) 07/27/2016  . Anemia 07/27/2016  . 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 Dysphagia, pharyngoesophageal phase 05/18/2016  . GERD without esophagitis 05/09/2016  . Bilateral lower extremity edema 05/09/2016  . Slow transit constipation 05/09/2016  . Depression with anxiety 05/09/2016  . Respiratory failure with hypoxia and hypercapnia (HCC) 07/22/2015  . Cervical vertebral fusion   . Depression   . Chronic neck and back pain   . COPD exacerbation (HCC)   . Congestive dilated cardiomyopathy (HCC)   . Arthritis of right hip 08/04/2014  . Essential hypertension 08/04/2014  . Hypothyroidism 08/04/2014  . Syncope 08/04/2014  . Fall 08/04/2014     Past Surgical History:  Procedure Laterality Date  . ABDOMINAL AORTIC ANEURYSM REPAIR  2009   Endovascular AAA repair  . ABDOMINAL AORTIC ANEURYSM REPAIR    . CAROTID ANGIOGRAPHY  09/09/2016   Procedure: Carotid Angiography;  Surgeon: Nada Libman, MD;  Location: [facility] INVASIVE CV LAB;  Service: Cardiovascular;;  rt. carotid  . CARPAL TUNNEL RELEASE     bilateral CTS  . ESOPHAGOGASTRODUODENOSCOPY (EGD) WITH PROPOFOL N/A 09/28/2016   Procedure: ESOPHAGOGASTRODUODENOSCOPY (EGD) WITH PROPOFOL;  Surgeon: Midge Minium, MD;  Location: [facility] ENDOSCOPY;  Service: Endoscopy;  Laterality: N/A;  . HAMMER TOE SURGERY  12/10/2011   Procedure: HAMMER TOE CORRECTION;  Surgeon: Ernestene Kiel, DPM;  Location: [facility];  Service: Podiatry;  Laterality: Right;  Right hammertoe repair second right with 2.5x42 orthopro screw   . IR GENERIC HISTORICAL  04/05/2016   IR GASTROSTOMY TUBE MOD SED 04/05/2016 [facility]  . IR GENERIC HISTORICAL  05/12/2016   IR CM INJ ANY COLONIC TUBE W/FLUORO 05/12/2016 Malachy Moan, MD [facility]  . IR GENERIC HISTORICAL  05/26/2016   IR CM INJ ANY COLONIC TUBE W/FLUORO 05/26/2016  [facility]  . IR GENERIC HISTORICAL  05/28/2016   IR REPLC GASTRO/COLONIC TUBE PERCUT W/FLUORO 05/28/2016 Brayton El, PA-C [facility]  . IR GENERIC HISTORICAL  07/02/2016   IR GASTROSTOMY TUBE REMOVAL 07/02/2016 Brayton El, PA-C [facility]  . METATARSAL OSTEOTOMY  12/10/2011   Procedure: METATARSAL OSTEOTOMY;  Surgeon: Ernestene Kiel, DPM;  Location: [facility];  Service: Podiatry;  Laterality: Right;  Right Keller arthroplasty with size 5 silicone implant right great toe; second metatarsal osteotomy with 2.0x14 screw  . NECK SURGERY    . PERIPHERAL VASCULAR CATHETERIZATION N/A 02/11/2015   Procedure: Carotid Angiography;  Surgeon: Nada Libman, MD;  Location: [facility] INVASIVE CV LAB;  Service: Cardiovascular;  Laterality: N/A;  . PILONIDAL CYST / SINUS EXCISION    . Rotary cuff    . SPINE SURGERY  09/15/11   Scar tissue removed- back  . SPINE SURGERY  09/15/11   Scar tissue removed- back  . TONSILLECTOMY    . TRACHEOSTOMY TUBE PLACEMENT N/A 03/03/2016   Procedure: TRACHEOSTOMY;  Surgeon: Geanie Logan, MD;  Location: [facility] ORS;  Service: ENT;  Laterality: N/A;     Prior to Admission medications   Medication Sig Start Date End Date Taking? Authorizing Provider  amLODipine (NORVASC) 5 MG tablet Take 5 mg by mouth daily. 09/29/17  Yes [provider]  aspirin EC 81 MG tablet Take 81 mg by mouth daily.   Yes [provider]  clopidogrel (PLAVIX) 75 MG tablet TAKE 1 TABLET BY MOUTH EVERY DAY 09/20/17  Yes Chuck Hint, MD  desonide (DESOWEN) 0.05 % cream APPLY TO AFFECTED AREA TWICE A DAY 11/03/17  Yes [provider]  [place] Lanius Omega-3 500 MG CAPS Take 1 tablet by mouth daily.   Yes [provider]  levothyroxine (SYNTHROID, LEVOTHROID) 200 MCG tablet Take 200 mcg by mouth daily before breakfast.   Yes [provider]  metoprolol tartrate (LOPRESSOR) 25 MG tablet Take 1 tablet (25 mg total) by mouth 2 (two) times daily. 07/27/16   Yes Katharina Caper, MD  Multiple Vitamin (MULTIVITAMIN WITH MINERALS) TABS tablet Take 1 tablet by mouth daily.   Yes [provider]  OXYGEN 4 L/min by Intratracheal route as needed (for shortness of breath).    Yes [provider]  SYMBICORT 160-4.5 MCG/ACT inhaler TAKE 2 PUFFS BY MOUTH TWICE A DAY 01/18/18  Yes Merwyn Katos, MD  tiotropium (SPIRIVA) 18 MCG inhalation capsule Place 1 capsule (18 mcg total) into inhaler and inhale daily. 10/11/17  Yes Auburn Bilberry, MD  baclofen (LIORESAL) 10 MG tablet Take 10 mg by mouth 2 (two) times daily as needed for muscle spasms.     [provider]  clotrimazole-betamethasone (LOTRISONE) cream Apply 1 application topically 2 (two) times daily. 01/02/18   Fisher, Roselyn Bering, PA-C  furosemide (LASIX) 20 MG tablet Take 1 tablet (20 mg total) by mouth daily. 09/13/17 02/21/18  Milagros Loll, MD  ipratropium-albuterol (DUONEB) 0.5-2.5 (3) MG/3ML SOLN Take 3 mLs by nebulization every 6 (six) hours as needed. Dx:J44.9 12/21/16   Merwyn Katos, MD  sertraline (  ZOLOFT) 100 MG tablet Take 1 tablet (100 mg total) by mouth daily. Patient not taking: Reported on 02/23/2018 06/27/17   Shaune Pollack, MD     Allergies Ampicillin; Nortriptyline; Brimonidine; Cyclobenzaprine; Tizanidine; and Gabapentin   Family History  Problem Relation Age of Onset  . Stomach cancer Mother   . Hypertension Brother   . Heart attack Father     Social History Social History   Tobacco Use  . Smoking status: Former Smoker    Packs/day: 1.00    Years: 30.00    Pack years: 30.00    Types: Cigarettes    Last attempt to quit: 08/30/2006    Years since quitting: 11.4  . Smokeless tobacco: Never Used  Substance Use Topics  . Alcohol use: Yes    Alcohol/week: 2.0 standard drinks    Types: 2 Cans of beer per week    Comment: occasional use  . Drug use: No    Review of Systems  Level 5 Caveat: Portions of the History and Physical including HPI and review of  systems are unable to be reliably obtained due to patient being a poor historian  ___________________________________   PHYSICAL EXAM:  VITAL SIGNS: ED Triage Vitals  Enc Vitals Group     BP 02/23/18 1046 (!) 160/124     Pulse Rate 02/23/18 1046 (!) 108     Resp 02/23/18 1130 (!) 21     Temp 02/23/18 1046 98.3 F (36.8 C)     Temp Source 02/23/18 1046 Oral     SpO2 02/23/18 1046 90 %     Weight 02/23/18 1048 243 lb 9.7 oz (110.5 kg)     Height 02/23/18 1059 5\' 10"  (1.778 m)     Head Circumference --      Peak Flow --      Pain Score --      Pain Loc --      Pain Edu? --      Excl. in GC? --     Vital signs reviewed, nursing assessments reviewed.   Constitutional:   Alert and oriented to self.  Ill-appearing. Eyes:   Conjunctivae are normal. EOMI. PERRL. ENT      Head:   Normocephalic and atraumatic.      Nose:   No congestion/rhinnorhea.       Mouth/Throat:   Dry mucous membranes, no pharyngeal erythema. No peritonsillar mass.       Neck:   No meningismus. Full ROM. Hematological/Lymphatic/Immunilogical:   No cervical lymphadenopathy. Cardiovascular: Tachycardia rate 110. Symmetric bilateral radial and DP pulses.  No murmurs. Cap refill less than 2 seconds. Respiratory:   Tachypnea and increased work of breathing.  Breath sounds are clear and equal bilaterally.  Diffuse expiratory wheezing. Gastrointestinal:   Soft and nontender. Non distended. There is no CVA tenderness.  No rebound, rigidity, or guarding. Genitourinary:   Unremarkable Musculoskeletal:   Edema and tenderness on the top of the left foot.  Nontender swelling of the left wrist. Neurologic:   Normal speech, inappropriate and limited language.  Motor grossly intact. No acute focal neurologic deficits are appreciated.  Skin:    Skin is warm, dry and intact. No rash noted.  No petechiae, purpura, or bullae.  ____________________________________________    LABS (pertinent positives/negatives) (all labs  ordered are listed, but only abnormal results are displayed) Labs Reviewed  COMPREHENSIVE METABOLIC PANEL - Abnormal; Notable for the following components:      Result Value   Chloride 95 (*)  CO2 35 (*)    Glucose, Bld 110 (*)    Total Bilirubin 2.3 (*)    All other components within normal limits  TROPONIN I - Abnormal; Notable for the following components:   Troponin I 0.03 (*)    All other components within normal limits  CBC WITH DIFFERENTIAL/PLATELET - Abnormal; Notable for the following components:   WBC 11.1 (*)    RBC 3.64 (*)    Hemoglobin 11.2 (*)    HCT 33.9 (*)    RDW 15.6 (*)    Platelets 129 (*)    Neutro Abs 9.4 (*)    Lymphs Abs 0.5 (*)    Monocytes Absolute 1.1 (*)    All other components within normal limits  URINALYSIS, COMPLETE (UACMP) WITH MICROSCOPIC - Abnormal; Notable for the following components:   Color, Urine AMBER (*)    APPearance CLOUDY (*)    Hgb urine dipstick SMALL (*)    Protein, ur 100 (*)    Nitrite POSITIVE (*)    Leukocytes, UA LARGE (*)    WBC, UA >50 (*)    Bacteria, UA RARE (*)    All other components within normal limits  BLOOD GAS, VENOUS - Abnormal; Notable for the following components:   pCO2, Ven 78 (*)    Bicarbonate 42.1 (*)    Acid-Base Excess 13.4 (*)    All other components within normal limits  CULTURE, BLOOD (ROUTINE X 2)  CULTURE, BLOOD (ROUTINE X 2)  URINE CULTURE  LACTIC ACID, PLASMA  LIPASE, BLOOD  PROCALCITONIN  PROTIME-INR  APTT   ____________________________________________   EKG  Interpreted by me Sinus tachycardia rate 108, right axis, normal intervals.  Normal QRS ST segments and T waves.  ____________________________________________    RADIOLOGY  Dg Wrist Complete Left  Result Date: 02/23/2018 CLINICAL DATA:  LEFT wrist swelling post fall EXAM: LEFT WRIST - COMPLETE 3+ VIEW COMPARISON:  03/13/2017 FINDINGS: Osseous demineralization. Radiocarpal and midcarpal joint space narrowing. Chronic  dorsal dislocation of the lunate. Extensive soft tissue swelling at wrist. No acute fracture, additional dislocation or bone destruction. Spur formation at the volar margin of the radiocarpal joint. IMPRESSION: Chronic dorsal lunate dislocation. Extensive degenerative changes and osseous demineralization. No definite acute fracture identified. Electronically Signed   By: Ulyses Southward M.D.   On: 02/23/2018 12:46   Ct Angio Chest Pe W And/or Wo Contrast  Result Date: 02/23/2018 CLINICAL DATA:  72 year old male with acute shortness of breath, recent fall, hypoxia and abdominal and pelvic discomfort. EXAM: CT ANGIOGRAPHY CHEST CT ABDOMEN AND PELVIS WITH CONTRAST TECHNIQUE: Multidetector CT imaging of the chest was performed using the standard protocol during bolus administration of intravenous contrast. Multiplanar CT image reconstructions and MIPs were obtained to evaluate the vascular anatomy. Multidetector CT imaging of the abdomen and pelvis was performed using the standard protocol during bolus administration of intravenous contrast. CONTRAST:  75mL OMNIPAQUE IOHEXOL 350 MG/ML SOLN COMPARISON:  10/09/2017 chest CT, 04/16/2015 abdomen/pelvic CT and other studies FINDINGS: CTA CHEST FINDINGS Cardiovascular: This is a technically borderline study due to respiratory motion artifact and borderline contrast opacification of the pulmonary arteries. No central, large or definite pulmonary emboli identified. Prominent main pulmonary artery again noted. Cardiomegaly identified without pericardial effusion. Coronary artery and aortic atherosclerotic calcification noted without aortic aneurysm. Mediastinum/Nodes: No enlarged mediastinal, hilar, or axillary lymph nodes. Thyroid gland, trachea, and esophagus demonstrate no significant findings. Lungs/Pleura: RIGHT LOWER lobe scarring/atelectasis again noted. No new pulmonary opacities are identified. No pleural effusion or  pneumothorax. Musculoskeletal: No acute or suspicious  bony abnormality identified. Posterior fusion hardware in the cervical/thoracic spine again noted. Review of the MIP images confirms the above findings. CT ABDOMEN and PELVIS FINDINGS Hepatobiliary: No hepatic abnormalities identified. Cholelithiasis again identified without CT evidence of acute cholecystitis. No biliary dilatation. Pancreas: Mildly atrophic without other significant abnormality Spleen: Unremarkable Adrenals/Urinary Tract: The kidneys and adrenal glands are unremarkable. Mild circumferential wall thickening of the bladder again noted. No hydronephrosis or urinary calculi. Stomach/Bowel: Stomach is within normal limits. No evidence of bowel wall thickening, distention, or inflammatory changes. Colonic diverticulosis noted without evidence of diverticulitis. A moderate to large amount of stool within the distal sigmoid colon and rectum noted. Vascular/Lymphatic: Aorto bi-iliac endograft appears unchanged. Abdominal aorta again measures 3.5 cm in greatest diameter. No enlarged lymph nodes identified. Reproductive: Prostate calcifications again noted. Other: No free fluid, abscess or pneumoperitoneum. Musculoskeletal: No acute or suspicious bony abnormalities identified. Posterior fusion changes at L4-L5 again noted. Review of the MIP images confirms the above findings. IMPRESSION: 1. No evidence of acute abnormality. No pulmonary emboli identified but the study is technically borderline. 2. Cholelithiasis without CT evidence of acute cholecystitis. 3. Aorto bi-iliac endograft again noted without change. 4. Continued RIGHT LOWER lobe atelectasis/scarring. 5. Cardiomegaly and prominent main pulmonary artery/pulmonary arterial hypertension 6. Coronary artery and aortic Atherosclerosis (ICD10-I70.0). Electronically Signed   By: Harmon Pier M.D.   On: 02/23/2018 13:21   Ct Abdomen Pelvis W Contrast  Result Date: 02/23/2018 CLINICAL DATA:  72 year old male with acute shortness of breath, recent fall,  hypoxia and abdominal and pelvic discomfort. EXAM: CT ANGIOGRAPHY CHEST CT ABDOMEN AND PELVIS WITH CONTRAST TECHNIQUE: Multidetector CT imaging of the chest was performed using the standard protocol during bolus administration of intravenous contrast. Multiplanar CT image reconstructions and MIPs were obtained to evaluate the vascular anatomy. Multidetector CT imaging of the abdomen and pelvis was performed using the standard protocol during bolus administration of intravenous contrast. CONTRAST:  75mL OMNIPAQUE IOHEXOL 350 MG/ML SOLN COMPARISON:  10/09/2017 chest CT, 04/16/2015 abdomen/pelvic CT and other studies FINDINGS: CTA CHEST FINDINGS Cardiovascular: This is a technically borderline study due to respiratory motion artifact and borderline contrast opacification of the pulmonary arteries. No central, large or definite pulmonary emboli identified. Prominent main pulmonary artery again noted. Cardiomegaly identified without pericardial effusion. Coronary artery and aortic atherosclerotic calcification noted without aortic aneurysm. Mediastinum/Nodes: No enlarged mediastinal, hilar, or axillary lymph nodes. Thyroid gland, trachea, and esophagus demonstrate no significant findings. Lungs/Pleura: RIGHT LOWER lobe scarring/atelectasis again noted. No new pulmonary opacities are identified. No pleural effusion or pneumothorax. Musculoskeletal: No acute or suspicious bony abnormality identified. Posterior fusion hardware in the cervical/thoracic spine again noted. Review of the MIP images confirms the above findings. CT ABDOMEN and PELVIS FINDINGS Hepatobiliary: No hepatic abnormalities identified. Cholelithiasis again identified without CT evidence of acute cholecystitis. No biliary dilatation. Pancreas: Mildly atrophic without other significant abnormality Spleen: Unremarkable Adrenals/Urinary Tract: The kidneys and adrenal glands are unremarkable. Mild circumferential wall thickening of the bladder again noted. No  hydronephrosis or urinary calculi. Stomach/Bowel: Stomach is within normal limits. No evidence of bowel wall thickening, distention, or inflammatory changes. Colonic diverticulosis noted without evidence of diverticulitis. A moderate to large amount of stool within the distal sigmoid colon and rectum noted. Vascular/Lymphatic: Aorto bi-iliac endograft appears unchanged. Abdominal aorta again measures 3.5 cm in greatest diameter. No enlarged lymph nodes identified. Reproductive: Prostate calcifications again noted. Other: No free fluid, abscess or pneumoperitoneum. Musculoskeletal: No acute or  suspicious bony abnormalities identified. Posterior fusion changes at L4-L5 again noted. Review of the MIP images confirms the above findings. IMPRESSION: 1. No evidence of acute abnormality. No pulmonary emboli identified but the study is technically borderline. 2. Cholelithiasis without CT evidence of acute cholecystitis. 3. Aorto bi-iliac endograft again noted without change. 4. Continued RIGHT LOWER lobe atelectasis/scarring. 5. Cardiomegaly and prominent main pulmonary artery/pulmonary arterial hypertension 6. Coronary artery and aortic Atherosclerosis (ICD10-I70.0). Electronically Signed   By: Harmon Pier M.D.   On: 02/23/2018 13:21   Dg Chest Port 1 View  Result Date: 02/23/2018 CLINICAL DATA:  Altered mental status EXAM: PORTABLE CHEST 1 VIEW COMPARISON:  10/04/2017 chest radiograph. FINDINGS: Surgical hardware overlying the cervical spine is partially visualized. Stable cardiomediastinal silhouette with mild cardiomegaly. No pneumothorax. No pleural effusion. Borderline mild pulmonary edema. Stable elevation of the right hemidiaphragm with mild right basilar scarring versus atelectasis. IMPRESSION: 1. Borderline mild congestive heart failure. 2. Mild right basilar scarring versus atelectasis. Electronically Signed   By: Delbert Phenix M.D.   On: 02/23/2018 12:19   Dg Foot Complete Left  Result Date:  02/23/2018 CLINICAL DATA:  LEFT foot and LEFT wrist swelling after fall, LEFT foot bruising EXAM: LEFT FOOT - COMPLETE 3+ VIEW COMPARISON:  10/08/2013 FINDINGS: Marked dorsal soft tissue swelling LEFT foot. Osseous demineralization. Prior joint replacement surgery at first MTP joint. Small bone fragments are identified at the expected position of the dorsal margin of the tarsal navicular. Tarsal navicular however is dislocated inferiorly. In addition, the proximal margin of the cuboid is poorly defined and questionably destroyed versus fractured though no definite bone fragment is seen. These changes could reflect traumatic injuries at the midfoot or Charcot changes. No additional fracture or dislocation identified. IMPRESSION: Inferior dislocation of the tarsal navicular at the talonavicular joint with small bone fragments at the cranial margin question avulsion injury at the talonavicular ligament/capsular insertion. Questionable destruction or fracture at the proximal margin of the cuboid laterally. These changes could reflect acute trauma or Charcot changes; further evaluation by CT recommended. Electronically Signed   By: Ulyses Southward M.D.   On: 02/23/2018 12:33    ____________________________________________   PROCEDURES .Critical Care Performed by: Sharman Cheek, MD Authorized by: Sharman Cheek, MD   Critical care provider statement:    Critical care time (minutes):  35   Critical care time was exclusive of:  Separately billable procedures and treating other patients   Critical care was necessary to treat or prevent imminent or life-threatening deterioration of the following conditions:  Respiratory failure, sepsis and CNS failure or compromise   Critical care was time spent personally by me on the following activities:  Development of treatment plan with patient or surrogate, discussions with consultants, evaluation of patient's response to treatment, examination of patient, obtaining  history from patient or surrogate, ordering and performing treatments and interventions, ordering and review of laboratory studies, ordering and review of radiographic studies, pulse oximetry, re-evaluation of patient's condition and review of old charts    ____________________________________________  DIFFERENTIAL DIAGNOSIS   Pneumonia, COPD exacerbation, pulmonary embolism, urinary tract infection, dehydration, electrolyte disturbance  CLINICAL IMPRESSION / ASSESSMENT AND PLAN / ED COURSE  Pertinent labs & imaging results that were available during my care of the patient were reviewed by me and considered in my medical decision making (see chart for details).    Patient presents with confusion and tachycardia.  Also increased work of breathing.  Sepsis work-up initiated on arrival without clear source.  Clinical  Course as of Feb 24 1536  Thu Feb 23, 2018  1205 Initial labs overall unremarkable. Pt with tachycardia, dyspnea, concerning for PE. Given h/o AAA and reported back pain as well, will scan down through abd to eval for aortic complication vs abd. pathology   [PS]  1244 Clear UTI  Nitrite(!): POSITIVE [PS]    Clinical Course User Index [PS] Sharman Cheek, MD     ----------------------------------------- 3:38 PM on 02/23/2018 -----------------------------------------  With UTI altered mental status tachycardia and tachypnea, patient will need further treatment in the hospital for control of delirium and early sepsis.  Patient received ceftriaxone and azithromycin which will cover UTI as well as provide a mortality benefit with his COPD exacerbation.  Hypercapnia appears to be chronic.  No acute pulmonary or intra-abdominal pathology on CT.  X-ray of left wrist is unremarkable except for chronic lunate dislocation.  X-ray left foot concerning for Charcot foot.  Will get a CT scan.  ____________________________________________   FINAL CLINICAL IMPRESSION(S) / ED  DIAGNOSES    Final diagnoses:  Altered mental status, unspecified altered mental status type  Cystitis  COPD with exacerbation [facility])     ED Discharge Orders    None      Portions of this note were generated with dragon dictation software. Dictation errors may occur despite best attempts at proofreading.    Sharman Cheek, MD 02/23/18 [phone]

## 2018-02-23 NOTE — ED Notes (Signed)
Pt has blood smear on right thigh with multiple abrasions.

## 2018-02-23 NOTE — ED Notes (Signed)
RN aware of bed assigned 

## 2018-02-23 NOTE — Progress Notes (Signed)
CODE SEPSIS - PHARMACY COMMUNICATION  **Broad Spectrum Antibiotics should be administered within 1 hour of Sepsis diagnosis**  Time Code Sepsis Called/Page Received: 1103  Antibiotics Ordered: ceftriaxone and azithromycin  Time of 1st antibiotic administration: 1348   Lowella Bandy ,PharmD Clinical Pharmacist  02/23/2018  4:51 PM

## 2018-02-23 NOTE — Progress Notes (Signed)
Family Meeting Note  Advance Directive: yes Today a meeting took place with the brother [name] who is patient's power of attorney  Patient is being admitted with sepsis, acute on chronic hypoxic hyper Respiratory failure and UTI. He was found lethargic and altered mental status with sats in the 70s. Code status was discussed with patient's brother who stays patient wants to be a DNR. He does have the DNR papers at home. Long-term prognosis poor. Consider palliative care consultation Time spent 17 mins   Enedina Finner, MD

## 2018-02-23 NOTE — ED Notes (Signed)
Report given to Kristina RN.

## 2018-02-23 NOTE — Consult Note (Signed)
Name: Chad Huffman TO MRN: [id number] DOB: 08-Aug-1945     CONSULTATION DATE: 02/23/2018   HISTORY OF PRESENT ILLNESS:  72 years old man with history of chronic respiratory failure on home O2, status post a tracheostomy in 2017 which has been decannulated, AAA status post aortobiiliac endograft, coronary artery disease, CHF and hypothyroidism.  Patient presented to the emergency room with altered mental status, reported initial O2 sat 70%.  He was found to be in respiratory acidosis, questionable sepsis and was started on a BiPAP as per Dr. Jearl Klinefelter.  Patient arrived to ICU on a BiPAP unresponsive with systolic blood pressure 160.  All history was obtained from admitting physician Dr. Jearl Klinefelter, [place] [patient's brother, Chad Huffman] and EMR  PAST MEDICAL HISTORY :   has a past medical history of Antalgic gait, Carotid artery occlusion, CHF (congestive heart failure) (HCC), Chronic neck and back pain, Complication of anesthesia, COPD (chronic obstructive pulmonary disease) (HCC), Depression, DJD (degenerative joint disease), Hypertension, Lumbago, and Peripheral neuropathy.  has a past surgical history that includes Tonsillectomy; Carpal tunnel release; Abdominal aortic aneurysm repair (2009); Pilonidal cyst / sinus excision; Spine surgery (09/15/11); Spine surgery (09/15/11); Neck surgery; Rotary cuff; Hammer toe surgery (12/10/2011); Metatarsal osteotomy (12/10/2011); Abdominal aortic aneurysm repair; Cardiac catheterization (N/A, 02/11/2015); Tracheostomy tube placement (N/A, 03/03/2016); ir generic historical (04/05/2016); ir generic historical (05/12/2016); ir generic historical (05/26/2016); ir generic historical (05/28/2016); ir generic historical (07/02/2016); CAROTID ANGIOGRAPHY (09/09/2016); and Esophagogastroduodenoscopy (egd) with propofol (N/A, 09/28/2016). Prior to Admission medications   Medication Sig Start Date End Date Taking? Authorizing Provider  amLODipine (NORVASC) 5 MG tablet Take 5 mg by mouth  daily. 09/29/17  Yes [provider]  aspirin EC 81 MG tablet Take 81 mg by mouth daily.   Yes [provider]  clopidogrel (PLAVIX) 75 MG tablet TAKE 1 TABLET BY MOUTH EVERY DAY 09/20/17  Yes Chuck Hint, MD  desonide (DESOWEN) 0.05 % cream APPLY TO AFFECTED AREA TWICE A DAY 11/03/17  Yes [provider]  [facility] Lanius Omega-3 500 MG CAPS Take 1 tablet by mouth daily.   Yes [provider]  levothyroxine (SYNTHROID, LEVOTHROID) 200 MCG tablet Take 200 mcg by mouth daily before breakfast.   Yes [provider]  metoprolol tartrate (LOPRESSOR) 25 MG tablet Take 1 tablet (25 mg total) by mouth 2 (two) times daily. 07/27/16  Yes Katharina Caper, MD  Multiple Vitamin (MULTIVITAMIN WITH MINERALS) TABS tablet Take 1 tablet by mouth daily.   Yes [provider]  OXYGEN 4 L/min by Intratracheal route as needed (for shortness of breath).    Yes [provider]  SYMBICORT 160-4.5 MCG/ACT inhaler TAKE 2 PUFFS BY MOUTH TWICE A DAY 01/18/18  Yes Merwyn Katos, MD  tiotropium (SPIRIVA) 18 MCG inhalation capsule Place 1 capsule (18 mcg total) into inhaler and inhale daily. 10/11/17  Yes Auburn Bilberry, MD  baclofen (LIORESAL) 10 MG tablet Take 10 mg by mouth 2 (two) times daily as needed for muscle spasms.     [provider]  clotrimazole-betamethasone (LOTRISONE) cream Apply 1 application topically 2 (two) times daily. 01/02/18   Fisher, Roselyn Bering, PA-C  furosemide (LASIX) 20 MG tablet Take 1 tablet (20 mg total) by mouth daily. 09/13/17 02/21/18  Milagros Loll, MD  ipratropium-albuterol (DUONEB) 0.5-2.5 (3) MG/3ML SOLN Take 3 mLs by nebulization every 6 (six) hours as needed. Dx:J44.9 12/21/16   Merwyn Katos, MD   Allergies  Allergen Reactions  . Ampicillin Swelling and Other (See  Comments)    Unable to obtain enough information to answer additional questions about this medication.  Has patient had a PCN reaction causing immediate rash,  facial/tongue/throat swelling, SOB or lightheadedness with hypotension: Yes Has patient had a PCN reaction causing severe rash involving mucus membranes or skin necrosis: No Has patient had a PCN reaction that required hospitalization No Has patient had a PCN reaction occurring within the last 10 years: Yes If all of the above answers are "NO", then may pr  . Nortriptyline Other (See Comments)    Sleep walking  . Brimonidine Other (See Comments)    Unknown reaction per MAR   . Cyclobenzaprine Other (See Comments)    Hallucinations  . Tizanidine Other (See Comments)    Hallucinations  . Gabapentin Rash    FAMILY HISTORY:  family history includes Heart attack in his father; Hypertension in his brother; Stomach cancer in his mother. SOCIAL HISTORY:  reports that he quit smoking about 11 years ago. His smoking use included cigarettes. He has a 30.00 pack-year smoking history. He has never used smokeless tobacco. He reports that he drinks about 2.0 standard drinks of alcohol per week. He reports that he does not use drugs.  REVIEW OF SYSTEMS:   Unable to obtain due to critical illness   VITAL SIGNS: Temp:  [98.3 F (36.8 C)] 98.3 F (36.8 C) (09/05 1046) Pulse Rate:  [99-115] 99 (09/05 1530) Resp:  [18-23] 19 (09/05 1530) BP: (129-205)/(72-124) 174/88 (09/05 1530) SpO2:  [90 %-98 %] 94 % (09/05 1530) Weight:  [110.5 kg] 110.5 kg (09/05 1048)  Physical Examination:  Grimaces to pain. On BiPAP 12/5, noticed to have stoma of previous tracheostomy, no air leak.  Bilateral equal air entry and no rales S1 & S2 are audible with no murmur Benign abdominal exam with feeble peristalsis Wasted extremities deformity left foot   ASSESSMENT / PLAN: Acute on chronic respiratory failure.  The patient had tracheostomy placed in 2017 status post decannulation unable to canulate. As discussed with POA Patient to not resuscitate, no CPR and no defibrillation.  However POA agreed to intubate and  start on mechanical ventilation if needed -Monitor ABG and work of breathing  Altered mental status with toxic metabolic encephalopathy/CO2 narcosis versus hypertensive encephalopathy.  CT head did not show acute intracranial abnormality however old right frontal lobe infarct -Monitor neuro status  Acute exacerbation of COPD with history of chronic respiratory failure on home O2 -Bronchodilators + tapering steroids + empiric antibiotics  Uncontrolled hypertension -Optimize antihypertensives and monitor hemodynamics  UTI -Rocephin and follow with culture  Chronic Charcot joint left foot  AAA status post repair with aortobiiliac endograft.   CAD.  Acute ST segment changes.  Echo 06/2016 LVEF 60% and grade 1 diastolic dysfunction  Anemia -Hemoglobin more than 7 g/dL  Thrombocytopenia -Monitor platelets  PE is ruled out with CT chest with contrast   Partial code. Okay to intubation, no CPR and no defibrillation  DVT & GI prophylaxis.  Continue with supportive care  Critical care time 55 minutes

## 2018-02-24 ENCOUNTER — Other Ambulatory Visit: Payer: Self-pay

## 2018-02-24 LAB — CBC WITH DIFFERENTIAL/PLATELET
BASOS PCT: 0 %
Basophils Absolute: 0 10*3/uL (ref 0–0.1)
EOS ABS: 0 10*3/uL (ref 0–0.7)
Eosinophils Relative: 0 %
HCT: 31.1 % — ABNORMAL LOW (ref 40.0–52.0)
HEMOGLOBIN: 10.5 g/dL — AB (ref 13.0–18.0)
Lymphocytes Relative: 3 %
Lymphs Abs: 0.3 10*3/uL — ABNORMAL LOW (ref 1.0–3.6)
MCH: 31.7 pg (ref 26.0–34.0)
MCHC: 33.9 g/dL (ref 32.0–36.0)
MCV: 93.6 fL (ref 80.0–100.0)
MONOS PCT: 7 %
Monocytes Absolute: 0.6 10*3/uL (ref 0.2–1.0)
NEUTROS PCT: 90 %
Neutro Abs: 8 10*3/uL — ABNORMAL HIGH (ref 1.4–6.5)
Platelets: 121 10*3/uL — ABNORMAL LOW (ref 150–440)
RBC: 3.32 MIL/uL — AB (ref 4.40–5.90)
RDW: 16 % — AB (ref 11.5–14.5)
WBC: 8.9 10*3/uL (ref 3.8–10.6)

## 2018-02-24 LAB — BASIC METABOLIC PANEL
ANION GAP: 8 (ref 5–15)
BUN: 18 mg/dL (ref 8–23)
CALCIUM: 8.6 mg/dL — AB (ref 8.9–10.3)
CHLORIDE: 99 mmol/L (ref 98–111)
CO2: 34 mmol/L — AB (ref 22–32)
Creatinine, Ser: 0.85 mg/dL (ref 0.61–1.24)
GFR calc Af Amer: >60 mL/min (ref >60)
GFR calc non Af Amer: >60 mL/min (ref >60)
GLUCOSE: 127 mg/dL — AB (ref 70–99)
Potassium: 4.7 mmol/L (ref 3.5–5.1)
Sodium: 141 mmol/L (ref 135–145)

## 2018-02-24 LAB — MRSA PCR SCREENING: MRSA BY PCR: NEGATIVE

## 2018-02-24 LAB — MAGNESIUM: Magnesium: 2.3 mg/dL (ref 1.7–2.4)

## 2018-02-24 LAB — PHOSPHORUS: PHOSPHORUS: 3.6 mg/dL (ref 2.5–4.6)

## 2018-02-24 LAB — T4, FREE: FREE T4: 1.3 ng/dL (ref 0.82–1.77)

## 2018-02-24 MED ORDER — ENOXAPARIN SODIUM 40 MG/0.4ML ~~LOC~~ SOLN
40.0000 mg | SUBCUTANEOUS | Status: DC
  Administered 2018-02-24 – 2018-02-27 (×4): 40 mg via SUBCUTANEOUS
  Filled 2018-02-24 (×4): qty 0.4

## 2018-02-24 MED ORDER — SODIUM CHLORIDE 0.9 % IV SOLN
1.0000 g | INTRAVENOUS | Status: DC
  Administered 2018-02-24 – 2018-02-25 (×2): 1 g via INTRAVENOUS
  Filled 2018-02-24: qty 1
  Filled 2018-02-24: qty 10
  Filled 2018-02-24: qty 1

## 2018-02-24 MED ORDER — SODIUM CHLORIDE 0.9 % IV SOLN
500.0000 mg | INTRAVENOUS | Status: AC
  Administered 2018-02-24 – 2018-02-25 (×2): 500 mg via INTRAVENOUS
  Filled 2018-02-24 (×2): qty 500

## 2018-02-24 NOTE — Progress Notes (Signed)
Patient arrived from ICU  

## 2018-02-24 NOTE — Progress Notes (Signed)
Patient requested to be taken off of his Bi-Pap. Patient alert to self and situation, just not the year. Patient placed on 2L [place]. Sating 96%. Dr. Duanne Limerick here and aware.

## 2018-02-24 NOTE — Progress Notes (Signed)
Patient alert, no complaints of pain. On 2 liters of oxygen. Cardiac monitoring ordered. Heart healthy diet ordered, using bedside commode. Report given to Anabella.

## 2018-02-24 NOTE — Progress Notes (Signed)
[employer] - [place] [facility]   PATIENT NAME: Chad Huffman    MR#:  [id number]  DATE OF BIRTH:  1945-08-25  SUBJECTIVE:  CHIEF COMPLAINT:   Chief Complaint  Patient presents with  . Altered Mental Status   Brought with altered mental status and confusion.  Noted to have UTI and COPD exacerbation.  Feeling much better today and completely alert and oriented.  REVIEW OF SYSTEMS:  CONSTITUTIONAL: No fever, fatigue or weakness.  EYES: No blurred or double vision.  EARS, NOSE, AND THROAT: No tinnitus or ear pain.  RESPIRATORY: No cough, shortness of breath, wheezing or hemoptysis.  CARDIOVASCULAR: No chest pain, orthopnea, edema.  GASTROINTESTINAL: No nausea, vomiting, diarrhea or abdominal pain.  GENITOURINARY: No dysuria, hematuria.  ENDOCRINE: No polyuria, nocturia,  HEMATOLOGY: No anemia, easy bruising or bleeding SKIN: No rash or lesion. MUSCULOSKELETAL: No joint pain or arthritis.   NEUROLOGIC: No tingling, numbness, weakness.  PSYCHIATRY: No anxiety or depression.   ROS  DRUG ALLERGIES:   Allergies  Allergen Reactions  . Ampicillin Swelling and Other (See Comments)    Unable to obtain enough information to answer additional questions about this medication.  Has patient had a PCN reaction causing immediate rash, facial/tongue/throat swelling, SOB or lightheadedness with hypotension: Yes Has patient had a PCN reaction causing severe rash involving mucus membranes or skin necrosis: No Has patient had a PCN reaction that required hospitalization No Has patient had a PCN reaction occurring within the last 10 years: Yes If all of the above answers are "NO", then may pr  . Nortriptyline Other (See Comments)    Sleep walking  . Brimonidine Other (See Comments)    Unknown reaction per MAR   . Cyclobenzaprine Other (See Comments)    Hallucinations  . Tizanidine Other (See Comments)    Hallucinations  . Gabapentin Rash    VITALS:  Blood pressure (!)  147/75, pulse 64, temperature 99 F (37.2 C), resp. rate 18, height 5\' 10"  (1.778 m), weight 110.5 kg, SpO2 95 %.  PHYSICAL EXAMINATION:   GENERAL:  72 y.o.-year-old patient lying in the bed with no acute distress. EYES: Pupils equal, round, reactive to light and accommodation. No scleral icterus. Extraocular muscles intact.  HEENT: Head atraumatic, normocephalic. Oropharynx and nasopharynx clear.  NECK:  Supple, no jugular venous distention. No thyroid enlargement, no tenderness. Previous tracheostomy LUNGS: shallow breath sounds bilaterally, no wheezing, rales,rhonchi or crepitation. No use of accessory muscles of respiration.  CARDIOVASCULAR: S1, S2 normal. No murmurs, rubs, or gallops. tachycardia ABDOMEN: Soft, nontender, nondistended. Bowel sounds present. No organomegaly or mass.  EXTREMITIES: chronic left lower extremity foot deformity with bruises present dry skin. Previous foot surgery changes noted NEUROLOGIC: Patient is alert and oriented, no cranial nerve deficits, follows simple command and power is 4 out of 5 in all limbs.  PSYCHIATRIC:  Alert and oriented x3.   SKIN: No obvious rash, lesion, or ulcer.   Physical Exam LABORATORY PANEL:   CBC Recent Labs  Lab 02/24/18 0525  WBC 8.9  HGB 10.5*  HCT 31.1*  PLT 121*   ------------------------------------------------------------------------------------------------------------------  Chemistries  Recent Labs  Lab 02/23/18 1053 02/24/18 0525  NA 138 141  K 4.2 4.7  CL 95* 99  CO2 35* 34*  GLUCOSE 110* 127*  BUN 21 18  CREATININE 1.09 0.85  CALCIUM 9.2 8.6*  MG  --  2.3  AST 23  --   ALT 15  --   ALKPHOS 64  --  BILITOT 2.3*  --    ------------------------------------------------------------------------------------------------------------------  Cardiac Enzymes Recent Labs  Lab 02/23/18 1053  TROPONINI 0.03*    ------------------------------------------------------------------------------------------------------------------  RADIOLOGY:  Dg Wrist Complete Left  Result Date: 02/23/2018 CLINICAL DATA:  LEFT wrist swelling post fall EXAM: LEFT WRIST - COMPLETE 3+ VIEW COMPARISON:  03/13/2017 FINDINGS: Osseous demineralization. Radiocarpal and midcarpal joint space narrowing. Chronic dorsal dislocation of the lunate. Extensive soft tissue swelling at wrist. No acute fracture, additional dislocation or bone destruction. Spur formation at the volar margin of the radiocarpal joint. IMPRESSION: Chronic dorsal lunate dislocation. Extensive degenerative changes and osseous demineralization. No definite acute fracture identified. Electronically Signed   By: Ulyses Southward M.D.   On: 02/23/2018 12:46   Ct Head Wo Contrast  Result Date: 02/23/2018 CLINICAL DATA:  Altered LOC EXAM: CT HEAD WITHOUT CONTRAST TECHNIQUE: Contiguous axial images were obtained from the base of the skull through the vertex without intravenous contrast. COMPARISON:  10/04/2017 FINDINGS: Brain: Motion degraded study. Artifact over the posterior fossa from hardware. No acute territorial infarction, hemorrhage or intracranial mass. Encephalomalacia in the right frontal lobe. Atrophy. Mild small vessel ischemic changes of the white matter. Probable old lacunar infarct in the left cerebellum. Vascular: No hyperdense vessels. Carotid vascular and vertebral artery calcification Skull: No fracture Sinuses/Orbits: No acute finding. Other: None IMPRESSION: 1. Motion degraded study. No CT evidence for acute intracranial abnormality. 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 Atrophy and mild small vessel ischemic changes of the white matter Electronically Signed   By: Jasmine Pang M.D.   On: 02/23/2018 16:40   Ct Angio Chest Pe W And/or Wo Contrast  Result Date: 02/23/2018 CLINICAL DATA:  72 year old male with acute shortness of breath, recent fall, hypoxia and  abdominal and pelvic discomfort. EXAM: CT ANGIOGRAPHY CHEST CT ABDOMEN AND PELVIS WITH CONTRAST TECHNIQUE: Multidetector CT imaging of the chest was performed using the standard protocol during bolus administration of intravenous contrast. Multiplanar CT image reconstructions and MIPs were obtained to evaluate the vascular anatomy. Multidetector CT imaging of the abdomen and pelvis was performed using the standard protocol during bolus administration of intravenous contrast. CONTRAST:  75mL OMNIPAQUE IOHEXOL 350 MG/ML SOLN COMPARISON:  10/09/2017 chest CT, 04/16/2015 abdomen/pelvic CT and other studies FINDINGS: CTA CHEST FINDINGS Cardiovascular: This is a technically borderline study due to respiratory motion artifact and borderline contrast opacification of the pulmonary arteries. No central, large or definite pulmonary emboli identified. Prominent main pulmonary artery again noted. Cardiomegaly identified without pericardial effusion. Coronary artery and aortic atherosclerotic calcification noted without aortic aneurysm. Mediastinum/Nodes: No enlarged mediastinal, hilar, or axillary lymph nodes. Thyroid gland, trachea, and esophagus demonstrate no significant findings. Lungs/Pleura: RIGHT LOWER lobe scarring/atelectasis again noted. No new pulmonary opacities are identified. No pleural effusion or pneumothorax. Musculoskeletal: No acute or suspicious bony abnormality identified. Posterior fusion hardware in the cervical/thoracic spine again noted. Review of the MIP images confirms the above findings. CT ABDOMEN and PELVIS FINDINGS Hepatobiliary: No hepatic abnormalities identified. Cholelithiasis again identified without CT evidence of acute cholecystitis. No biliary dilatation. Pancreas: Mildly atrophic without other significant abnormality Spleen: Unremarkable Adrenals/Urinary Tract: The kidneys and adrenal glands are unremarkable. Mild circumferential wall thickening of the bladder again noted. No  hydronephrosis or urinary calculi. Stomach/Bowel: Stomach is within normal limits. No evidence of bowel wall thickening, distention, or inflammatory changes. Colonic diverticulosis noted without evidence of diverticulitis. A moderate to large amount of stool within the distal sigmoid colon and rectum noted. Vascular/Lymphatic: Aorto bi-iliac endograft appears unchanged. Abdominal aorta again  measures 3.5 cm in greatest diameter. No enlarged lymph nodes identified. Reproductive: Prostate calcifications again noted. Other: No free fluid, abscess or pneumoperitoneum. Musculoskeletal: No acute or suspicious bony abnormalities identified. Posterior fusion changes at L4-L5 again noted. Review of the MIP images confirms the above findings. IMPRESSION: 1. No evidence of acute abnormality. No pulmonary emboli identified but the study is technically borderline. 2. Cholelithiasis without CT evidence of acute cholecystitis. 3. Aorto bi-iliac endograft again noted without change. 4. Continued RIGHT LOWER lobe atelectasis/scarring. 5. Cardiomegaly and prominent main pulmonary artery/pulmonary arterial hypertension 6. Coronary artery and aortic Atherosclerosis (ICD10-I70.0). Electronically Signed   By: Harmon Pier M.D.   On: 02/23/2018 13:21   Ct Abdomen Pelvis W Contrast  Result Date: 02/23/2018 CLINICAL DATA:  72 year old male with acute shortness of breath, recent fall, hypoxia and abdominal and pelvic discomfort. EXAM: CT ANGIOGRAPHY CHEST CT ABDOMEN AND PELVIS WITH CONTRAST TECHNIQUE: Multidetector CT imaging of the chest was performed using the standard protocol during bolus administration of intravenous contrast. Multiplanar CT image reconstructions and MIPs were obtained to evaluate the vascular anatomy. Multidetector CT imaging of the abdomen and pelvis was performed using the standard protocol during bolus administration of intravenous contrast. CONTRAST:  58mL OMNIPAQUE IOHEXOL 350 MG/ML SOLN COMPARISON:  10/09/2017  chest CT, 04/16/2015 abdomen/pelvic CT and other studies FINDINGS: CTA CHEST FINDINGS Cardiovascular: This is a technically borderline study due to respiratory motion artifact and borderline contrast opacification of the pulmonary arteries. No central, large or definite pulmonary emboli identified. Prominent main pulmonary artery again noted. Cardiomegaly identified without pericardial effusion. Coronary artery and aortic atherosclerotic calcification noted without aortic aneurysm. Mediastinum/Nodes: No enlarged mediastinal, hilar, or axillary lymph nodes. Thyroid gland, trachea, and esophagus demonstrate no significant findings. Lungs/Pleura: RIGHT LOWER lobe scarring/atelectasis again noted. No new pulmonary opacities are identified. No pleural effusion or pneumothorax. Musculoskeletal: No acute or suspicious bony abnormality identified. Posterior fusion hardware in the cervical/thoracic spine again noted. Review of the MIP images confirms the above findings. CT ABDOMEN and PELVIS FINDINGS Hepatobiliary: No hepatic abnormalities identified. Cholelithiasis again identified without CT evidence of acute cholecystitis. No biliary dilatation. Pancreas: Mildly atrophic without other significant abnormality Spleen: Unremarkable Adrenals/Urinary Tract: The kidneys and adrenal glands are unremarkable. Mild circumferential wall thickening of the bladder again noted. No hydronephrosis or urinary calculi. Stomach/Bowel: Stomach is within normal limits. No evidence of bowel wall thickening, distention, or inflammatory changes. Colonic diverticulosis noted without evidence of diverticulitis. A moderate to large amount of stool within the distal sigmoid colon and rectum noted. Vascular/Lymphatic: Aorto bi-iliac endograft appears unchanged. Abdominal aorta again measures 3.5 cm in greatest diameter. No enlarged lymph nodes identified. Reproductive: Prostate calcifications again noted. Other: No free fluid, abscess or  pneumoperitoneum. Musculoskeletal: No acute or suspicious bony abnormalities identified. Posterior fusion changes at L4-L5 again noted. Review of the MIP images confirms the above findings. IMPRESSION: 1. No evidence of acute abnormality. No pulmonary emboli identified but the study is technically borderline. 2. Cholelithiasis without CT evidence of acute cholecystitis. 3. Aorto bi-iliac endograft again noted without change. 4. Continued RIGHT LOWER lobe atelectasis/scarring. 5. Cardiomegaly and prominent main pulmonary artery/pulmonary arterial hypertension 6. Coronary artery and aortic Atherosclerosis (ICD10-I70.0). Electronically Signed   By: Harmon Pier M.D.   On: 02/23/2018 13:21   Ct Foot Left Wo Contrast  Result Date: 02/23/2018 CLINICAL DATA:  Foot swelling.  Possible fall today. EXAM: CT OF THE LEFT FOOT WITHOUT CONTRAST TECHNIQUE: Multidetector CT imaging of the left foot was performed according to  the standard protocol. Multiplanar CT image reconstructions were also generated. COMPARISON:  Radiographs dated 02/23/2018 and 10/08/2013 and 03/03/2016 FINDINGS: Bones/Joint/Cartilage There is fragmentation and subluxation of the navicular as well as fragmentation and subluxation of the cuboid at the calcaneocuboid joint as well as fragmentation and impaction calcaneus at the calcaneocuboid joint. 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 Patient has had a previous joint replacement at the first MTP joint. Ligaments Suboptimally assessed by CT. No discrete ligamentous disruption at the ankle. Muscles and Tendons The tendons around the ankle are intact. Achilles tendon and plantar fascia are intact. Soft tissues Arterial vascular calcifications in the foot and around the ankle. Diffuse subcutaneous edema of the foot and ankle. IMPRESSION: 1. The fragmentation and subluxations of the cuboid and navicular are consistent with Charcot joints. I do not think that the findings are consistent with  osteomyelitis. 2. Some of the fragmentation/fractures could be acute or subacute but the appearance is most consistent with chronic Charcot joints. 3. Diffuse subcutaneous edema is nonspecific. Electronically Signed   By: Francene Boyers M.D.   On: 02/23/2018 17:19   Dg Chest Port 1 View  Result Date: 02/23/2018 CLINICAL DATA:  Altered mental status EXAM: PORTABLE CHEST 1 VIEW COMPARISON:  10/04/2017 chest radiograph. FINDINGS: Surgical hardware overlying the cervical spine is partially visualized. Stable cardiomediastinal silhouette with mild cardiomegaly. No pneumothorax. No pleural effusion. Borderline mild pulmonary edema. Stable elevation of the right hemidiaphragm with mild right basilar scarring versus atelectasis. IMPRESSION: 1. Borderline mild congestive heart failure. 2. Mild right basilar scarring versus atelectasis. Electronically Signed   By: Delbert Phenix M.D.   On: 02/23/2018 12:19   Dg Foot Complete Left  Result Date: 02/23/2018 CLINICAL DATA:  LEFT foot and LEFT wrist swelling after fall, LEFT foot bruising EXAM: LEFT FOOT - COMPLETE 3+ VIEW COMPARISON:  10/08/2013 FINDINGS: Marked dorsal soft tissue swelling LEFT foot. Osseous demineralization. Prior joint replacement surgery at first MTP joint. Small bone fragments are identified at the expected position of the dorsal margin of the tarsal navicular. Tarsal navicular however is dislocated inferiorly. In addition, the proximal margin of the cuboid is poorly defined and questionably destroyed versus fractured though no definite bone fragment is seen. These changes could reflect traumatic injuries at the midfoot or Charcot changes. No additional fracture or dislocation identified. IMPRESSION: Inferior dislocation of the tarsal navicular at the talonavicular joint with small bone fragments at the cranial margin question avulsion injury at the talonavicular ligament/capsular insertion. Questionable destruction or fracture at the proximal margin of  the cuboid laterally. These changes could reflect acute trauma or Charcot changes; further evaluation by CT recommended. Electronically Signed   By: Ulyses Southward M.D.   On: 02/23/2018 12:33    ASSESSMENT AND PLAN:   Active Problems:   Acute metabolic encephalopathy  Chad Huffman  is a 72 y.o. male with a known history of chronic respiratory failure on chronic home oxygen, CHF, peripheral neuropathy comes to the emergency room after he was found by neighbors standing naked on his porch and confused. EMS was called the found his sats in the 70s. They also noted patient's oxygen tubing was kinked and stuck in his door. Found to have some deformities in his left foot with no bruising on his left foot. Cording to patient's brother a friend informed that patient's coffee table was knocked out in the house and there is a possibility patient may have fallen.  1. acute encephalopathy/altered mental status in the setting  of hypoxia acute on chronic hypercapnia hypoxic respiratory failure secondary to COPD exacerbation along with UTI -Monitor in ICU. Spoke with ICU attending -BiPAP- off now. -IV Solu-Medrol, nebulizer and inhalers. - much improved.  2. sepsis due to UTI -IV Rocephin -chest x-ray negative for pneumonia -follow blood culture and urine culture- negative so far.  3. Chronic left foot deformity with significant amount of bruising possibly superficial trauma from suspected fall at home -CT left foot no fracture -chronic changes of Charcot's joint  4. Hypertension continue home meds  5. DVT prophylaxis subcu Lovenox   All the records are reviewed and case discussed with Care Management/Social Workerr. Management plans discussed with the patient, family and they are in agreement.  CODE STATUS: DNR  TOTAL TIME TAKING CARE OF THIS PATIENT: 35 minutes.     POSSIBLE D/C IN 1-2 DAYS, DEPENDING ON CLINICAL CONDITION.   Altamese Dilling M.D on 02/24/2018   Between 7am to 6pm  - Pager - [phone]  After 6pm go to [email or web address] - password [email or web address]  [facility]  Office  [phone]  CC: Primary care physician; Dorothey Baseman, MD  Note: This dictation was prepared with Dragon dictation along with smaller phrase technology. Any transcriptional errors that result from this process are unintentional.

## 2018-02-25 ENCOUNTER — Inpatient Hospital Stay: Payer: Medicare Other

## 2018-02-25 LAB — URINE CULTURE: Culture: >=100000 — AB

## 2018-02-25 LAB — HIV ANTIBODY (ROUTINE TESTING W REFLEX): HIV Screen 4th Generation wRfx: NONREACTIVE

## 2018-02-25 LAB — CALCIUM, IONIZED: Calcium, Ionized, Serum: 4.7 mg/dL (ref 4.5–5.6)

## 2018-02-25 MED ORDER — GUAIFENESIN ER 600 MG PO TB12
600.0000 mg | ORAL_TABLET | Freq: Two times a day (BID) | ORAL | Status: DC | PRN
  Administered 2018-02-25 – 2018-02-26 (×2): 600 mg via ORAL
  Filled 2018-02-25 (×2): qty 1

## 2018-02-25 NOTE — Plan of Care (Signed)

## 2018-02-25 NOTE — Progress Notes (Signed)
[facility] [place] [facility]   PATIENT NAME: Chad Huffman    MR#:  [id number]  DATE OF BIRTH:  Nov 15, 1945  SUBJECTIVE:  CHIEF COMPLAINT:   Chief Complaint  Patient presents with  . Altered Mental Status   Patient mental status improved.  He states that his knee has given away twice recently and that is what caused him to fall.  REVIEW OF SYSTEMS:  CONSTITUTIONAL: No fever, fatigue or weakness.  EYES: No blurred or double vision.  EARS, NOSE, AND THROAT: No tinnitus or ear pain.  RESPIRATORY: No cough, shortness of breath, wheezing or hemoptysis.  CARDIOVASCULAR: No chest pain, orthopnea, edema.  GASTROINTESTINAL: No nausea, vomiting, diarrhea or abdominal pain.  GENITOURINARY: No dysuria, hematuria.  ENDOCRINE: No polyuria, nocturia,  HEMATOLOGY: No anemia, easy bruising or bleeding SKIN: No rash or lesion. MUSCULOSKELETAL: Left foot pain knee pain  nEUROLOGIC: No tingling, numbness, weakness.  PSYCHIATRY: No anxiety or depression.   ROS  DRUG ALLERGIES:   Allergies  Allergen Reactions  . Ampicillin Swelling and Other (See Comments)    Unable to obtain enough information to answer additional questions about this medication.  Has patient had a PCN reaction causing immediate rash, facial/tongue/throat swelling, SOB or lightheadedness with hypotension: Yes Has patient had a PCN reaction causing severe rash involving mucus membranes or skin necrosis: No Has patient had a PCN reaction that required hospitalization No Has patient had a PCN reaction occurring within the last 10 years: Yes If all of the above answers are "NO", then may pr  . Nortriptyline Other (See Comments)    Sleep walking  . Brimonidine Other (See Comments)    Unknown reaction per MAR   . Cyclobenzaprine Other (See Comments)    Hallucinations  . Tizanidine Other (See Comments)    Hallucinations  . Gabapentin Rash    VITALS:  Blood pressure (!) 176/84, pulse 86, temperature 98 F  (36.7 C), temperature source Oral, resp. rate 20, height 5\' 10"  (1.778 m), weight 110.5 kg, SpO2 93 %.  PHYSICAL EXAMINATION:   GENERAL:  72 y.o.-year-old patient lying in the bed with no acute distress. EYES: Pupils equal, round, reactive to light and accommodation. No scleral icterus. Extraocular muscles intact.  HEENT: Head atraumatic, normocephalic. Oropharynx and nasopharynx clear.  NECK:  Supple, no jugular venous distention. No thyroid enlargement, no tenderness. Previous tracheostomy LUNGS: shallow breath sounds bilaterally, no wheezing, rales,rhonchi or crepitation. No use of accessory muscles of respiration.  CARDIOVASCULAR: S1, S2 normal. No murmurs, rubs, or gallops. tachycardia ABDOMEN: Soft, nontender, nondistended. Bowel sounds present. No organomegaly or mass.  EXTREMITIES: chronic left lower extremity foot deformity with bruises present dry skin. Previous foot surgery changes noted NEUROLOGIC: Patient is alert and oriented, no cranial nerve deficits, follows simple command and power is 4 out of 5 in all limbs.  PSYCHIATRIC:  Alert and oriented x3.   SKIN: No obvious rash, lesion, or ulcer.   Physical Exam LABORATORY PANEL:   CBC Recent Labs  Lab 02/24/18 0525  WBC 8.9  HGB 10.5*  HCT 31.1*  PLT 121*   ------------------------------------------------------------------------------------------------------------------  Chemistries  Recent Labs  Lab 02/23/18 1053 02/24/18 0525  NA 138 141  K 4.2 4.7  CL 95* 99  CO2 35* 34*  GLUCOSE 110* 127*  BUN 21 18  CREATININE 1.09 0.85  CALCIUM 9.2 8.6*  MG  --  2.3  AST 23  --   ALT 15  --   ALKPHOS 64  --  BILITOT 2.3*  --    ------------------------------------------------------------------------------------------------------------------  Cardiac Enzymes Recent Labs  Lab 02/23/18 1053  TROPONINI 0.03*    ------------------------------------------------------------------------------------------------------------------  RADIOLOGY:  Ct Head Wo Contrast  Result Date: 02/23/2018 CLINICAL DATA:  Altered LOC EXAM: CT HEAD WITHOUT CONTRAST TECHNIQUE: Contiguous axial images were obtained from the base of the skull through the vertex without intravenous contrast. COMPARISON:  10/04/2017 FINDINGS: Brain: Motion degraded study. Artifact over the posterior fossa from hardware. No acute territorial infarction, hemorrhage or intracranial mass. Encephalomalacia in the right frontal lobe. Atrophy. Mild small vessel ischemic changes of the white matter. Probable old lacunar infarct in the left cerebellum. Vascular: No hyperdense vessels. Carotid vascular and vertebral artery calcification Skull: No fracture Sinuses/Orbits: No acute finding. Other: None IMPRESSION: 1. Motion degraded study. No CT evidence for acute intracranial abnormality. 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 Atrophy and mild small vessel ischemic changes of the white matter Electronically Signed   By: Jasmine Pang M.D.   On: 02/23/2018 16:40   Ct Foot Left Wo Contrast  Result Date: 02/23/2018 CLINICAL DATA:  Foot swelling.  Possible fall today. EXAM: CT OF THE LEFT FOOT WITHOUT CONTRAST TECHNIQUE: Multidetector CT imaging of the left foot was performed according to the standard protocol. Multiplanar CT image reconstructions were also generated. COMPARISON:  Radiographs dated 02/23/2018 and 10/08/2013 and 03/03/2016 FINDINGS: Bones/Joint/Cartilage There is fragmentation and subluxation of the navicular as well as fragmentation and subluxation of the cuboid at the calcaneocuboid joint as well as fragmentation and impaction calcaneus at the calcaneocuboid joint. 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 Patient has had a previous joint replacement at the first MTP joint. Ligaments Suboptimally assessed by CT. No discrete ligamentous disruption  at the ankle. Muscles and Tendons The tendons around the ankle are intact. Achilles tendon and plantar fascia are intact. Soft tissues Arterial vascular calcifications in the foot and around the ankle. Diffuse subcutaneous edema of the foot and ankle. IMPRESSION: 1. The fragmentation and subluxations of the cuboid and navicular are consistent with Charcot joints. I do not think that the findings are consistent with osteomyelitis. 2. Some of the fragmentation/fractures could be acute or subacute but the appearance is most consistent with chronic Charcot joints. 3. Diffuse subcutaneous edema is nonspecific. Electronically Signed   By: Francene Boyers M.D.   On: 02/23/2018 17:19    ASSESSMENT AND PLAN:   Active Problems:   Acute metabolic encephalopathy  Cole Eastridge  is a 72 y.o. male with a known history of chronic respiratory failure on chronic home oxygen, CHF, peripheral neuropathy comes to the emergency room after he was found by neighbors standing naked on his porch and confused. EMS was called the found his sats in the 70s. They also noted patient's oxygen tubing was kinked and stuck in his door. Found to have some deformities in his left foot with no bruising on his left foot. Cording to patient's brother a friend informed that patient's coffee table was knocked out in the house and there is a possibility patient may have fallen.  1. acute encephalopathy/altered mental status in the setting of hypoxia acute on chronic hypercapnia hypoxic respiratory failure secondary to COPD exacerbation along with UTI Doing better Continue supportive care  2. sepsis due to UTI due to Serratia -Continue IV Rocephin -chest x-ray negative for pneumonia -follow blood culture and urine culture- negative so far.  3. Chronic left foot deformity with significant amount of bruising possibly superficial trauma from suspected fall at  home -CT left foot no fracture -chronic changes of Charcot's joint -Complains of  left knee pain I will order x-ray  4. Hypertension continue home meds  5. DVT prophylaxis subcu Lovenox   All the records are reviewed and case discussed with Care Management/Social Workerr. Management plans discussed with the patient, family and they are in agreement.  CODE STATUS: DNR  TOTAL TIME TAKING CARE OF THIS PATIENT: 35 minutes.     POSSIBLE D/C IN 1-2 DAYS, DEPENDING ON CLINICAL CONDITION.   Auburn Bilberry M.D on 02/25/2018   Between 7am to 6pm - Pager - [phone]  After 6pm go to [email or web address] - password [email or web address]  [facility]  Office  [phone]  CC: Primary care physician; Dorothey Baseman, MD  Note: This dictation was prepared with Dragon dictation along with smaller phrase technology. Any transcriptional errors that result from this process are unintentional.

## 2018-02-25 NOTE — Evaluation (Signed)
Physical Therapy Evaluation Patient Details Name: Chad Huffman MRN: [id number] DOB: 1945-11-18 Today's Date: 02/25/2018   History of Present Illness  72 yo male was referred to PT for evaluation of gait and safety after admission for acute metabolic encephalopathy, UTI, elevated troponin and noted lunate dislocation L wrist.  Pt has sustained falls recently, cleared for PE but has exacerbation of his COPD.  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  Clinical Impression  Pt was able to walk with PT today, noted his O2 sats were very challenged as well as having limited tolerance for standing on LLE.  Pt is in some pain, and has bruising on L foot from recent fall which is making his foot more painful.  Has some likely chronic fractures of foot with no changes in WB but he is so hypoxic with mobility is struggling for this reason.  Pt is going to continue as tolerated with L foot in protective shoe, and will progress as his system allows given his hypoxia, to prepare for SNF stay.    Follow Up Recommendations SNF    Equipment Recommendations  Rolling walker with 5" wheels    Recommendations for Other Services       Precautions / Restrictions Precautions Precautions: Fall(telemetry) Required Braces or Orthoses: (undetermined if L foot should have supporting shoe) Restrictions Weight Bearing Restrictions: No      Mobility  Bed Mobility Overal bed mobility: Needs Assistance Bed Mobility: Supine to Sit;Sit to Supine     Supine to sit: Min assist Sit to supine: Min assist   General bed mobility comments: help to sit up and support trunk and return to bed with assistance to legs  Transfers Overall transfer level: Needs assistance Equipment used: Rolling walker (2 wheeled);1 person hand held assist Transfers: Sit to/from Stand Sit to Stand: Min assist         General transfer comment: min assist to power up and  steady initially with mild buckling appearance of knees  Ambulation/Gait Ambulation/Gait assistance: Min assist Gait Distance (Feet): 15 Feet Assistive device: Rolling walker (2 wheeled);1 person hand held assist Gait Pattern/deviations: Step-to pattern;Decreased stride length;Wide base of support;Trunk flexed;Decreased weight shift to left Gait velocity: reduced Gait velocity interpretation: <1.8 ft/sec, indicate of risk for recurrent falls General Gait Details: pt is very paced and slow to walk due to his SOB and issues of discomfort on L foot  Stairs            Wheelchair Mobility    Modified Rankin (Stroke Patients Only) Modified Rankin (Stroke Patients Only) Pre-Morbid Rankin Score: Moderate disability Modified Rankin: Moderately severe disability     Balance Overall balance assessment: History of Falls;Needs assistance Sitting-balance support: Feet supported Sitting balance-Leahy Scale: Good     Standing balance support: Bilateral upper extremity supported;During functional activity Standing balance-Leahy Scale: Poor Standing balance comment: reliant on his RW for steadying                             Pertinent Vitals/Pain Pain Assessment: Faces Faces Pain Scale: Hurts little more Pain Location: L foot with wb Pain Descriptors / Indicators: Tender Pain Intervention(s): Limited activity within patient's tolerance;Monitored during session;Repositioned;Premedicated before session    Home Living Family/patient expects to be discharged to:: Private residence Living Arrangements: Alone Available Help at Discharge: Friend(s);Available PRN/intermittently Type of Home: House Home Access: Stairs to enter Entrance Stairs-Rails: Right;Left;Can  reach both Entrance Stairs-Number of Steps: 4 Home Layout: One level Home Equipment: Walker - 2 wheels;Wheelchair - manual;Cane - single point;Bedside commode;Shower seat      Prior Function Level of Independence:  Needs assistance   Gait / Transfers Assistance Needed: Indep with RW with recent falls  ADL's / Homemaking Assistance Needed: able to do his own dressing but has bathing assist, help with meals and some housework        Hand Dominance   Dominant Hand: Right    Extremity/Trunk Assessment   Upper Extremity Assessment Upper Extremity Assessment: Overall WFL for tasks assessed    Lower Extremity Assessment Lower Extremity Assessment: Generalized weakness    Cervical / Trunk Assessment Cervical / Trunk Assessment: Kyphotic  Communication   Communication: No difficulties  Cognition Arousal/Alertness: Awake/alert Behavior During Therapy: WFL for tasks assessed/performed Overall Cognitive Status: Within Functional Limits for tasks assessed                                        General Comments General comments (skin integrity, edema, etc.): Pt sustained a loss of O2 sats with mobility and from 97% at rest dropped to 79% but began to recover on 2L O2.      Exercises     Assessment/Plan    PT Assessment Patient needs continued PT services  PT Problem List Decreased strength;Decreased range of motion;Decreased activity tolerance;Decreased balance;Decreased mobility;Decreased coordination;Decreased knowledge of use of DME;Decreased safety awareness;Cardiopulmonary status limiting activity;Obesity;Decreased skin integrity;Pain       PT Treatment Interventions DME instruction;Gait training;Stair training;Functional mobility training;Therapeutic activities;Therapeutic exercise;Balance training;Neuromuscular re-education;Patient/family education    PT Goals (Current goals can be found in the Care Plan section)  Acute Rehab PT Goals Patient Stated Goal: to walk and return home PT Goal Formulation: With patient Time For Goal Achievement: 03/11/18 Potential to Achieve Goals: Good    Frequency Min 2X/week   Barriers to discharge Inaccessible home  environment;Decreased caregiver support home with intermittent help and has stairs to enter house    Co-evaluation               AM-PAC PT "6 Clicks" Daily Activity  Outcome Measure Difficulty turning over in bed (including adjusting bedclothes, sheets and blankets)?: A Little Difficulty moving from lying on back to sitting on the side of the bed? : Unable Difficulty sitting down on and standing up from a chair with arms (e.g., wheelchair, bedside commode, etc,.)?: Unable Help needed moving to and from a bed to chair (including a wheelchair)?: A Little Help needed walking in hospital room?: A Little Help needed climbing 3-5 steps with a railing? : A Lot 6 Click Score: 13    End of Session Equipment Utilized During Treatment: Gait belt;Oxygen Activity Tolerance: Patient limited by fatigue;Treatment limited secondary to medical complications (Comment) Patient left: in bed;with call bell/phone within reach;with bed alarm set Nurse Communication: Mobility status PT Visit Diagnosis: Unsteadiness on feet (R26.81);Muscle weakness (generalized) (M62.81);History of falling (Z91.81);Difficulty in walking, not elsewhere classified (R26.2);Hemiplegia and hemiparesis Hemiplegia - Right/Left: Left Hemiplegia - dominant/non-dominant: Non-dominant Hemiplegia - caused by: Cerebral infarction    Time: 1194-1740 PT Time Calculation (min) (ACUTE ONLY): 33 min   Charges:   PT Evaluation $PT Eval Moderate Complexity: 1 Mod PT Treatments $Gait Training: 8-22 mins       Ivar Drape 02/25/2018, 3:45 PM  Samul Dada, PT MS Acute Rehab Dept.  Number: [place] and [place]

## 2018-02-26 MED ORDER — ACETAMINOPHEN 325 MG PO TABS
650.0000 mg | ORAL_TABLET | Freq: Four times a day (QID) | ORAL | Status: DC | PRN
  Administered 2018-02-26 (×2): 650 mg via ORAL
  Filled 2018-02-26 (×2): qty 2

## 2018-02-26 MED ORDER — OXYCODONE HCL 5 MG PO TABS
5.0000 mg | ORAL_TABLET | Freq: Four times a day (QID) | ORAL | Status: DC | PRN
  Administered 2018-02-26 – 2018-02-28 (×4): 5 mg via ORAL
  Filled 2018-02-26 (×4): qty 1

## 2018-02-26 MED ORDER — HYDRALAZINE HCL 20 MG/ML IJ SOLN
10.0000 mg | Freq: Four times a day (QID) | INTRAMUSCULAR | Status: DC | PRN
  Administered 2018-02-26 – 2018-02-28 (×2): 10 mg via INTRAVENOUS
  Filled 2018-02-26 (×2): qty 1

## 2018-02-26 MED ORDER — CEFDINIR 300 MG PO CAPS
300.0000 mg | ORAL_CAPSULE | Freq: Two times a day (BID) | ORAL | Status: DC
  Administered 2018-02-26 – 2018-02-28 (×5): 300 mg via ORAL
  Filled 2018-02-26 (×7): qty 1

## 2018-02-26 NOTE — Progress Notes (Signed)
Pt b/p still elevated after receiving morning meds, BP 176/73, HR 67. Dr. Allena Katz notified, received orders for PRN Hydralazine. Will continue to monitor.

## 2018-02-26 NOTE — Progress Notes (Signed)
Pt requested oxycodone md paged.

## 2018-02-26 NOTE — [place] FL2 (Signed)
[place] MEDICAID FL2 LEVEL OF CARE SCREENING TOOL     IDENTIFICATION  Patient Name: Chad Huffman Birthdate: Jan 20, 1946 Sex: male Admission Date (Current Location): 02/23/2018  [place] and [place] Number:  Chiropodist and Address:  [facility], [address] [place], [place] [address]      Provider Number: [id number]  Attending Physician Name and Address:  Auburn Bilberry, MD  Relative Name and Phone Number:  Kaiwen Nulph [phone])    Current Level of Care: Hospital Recommended Level of Care: Skilled Nursing Facility Prior Approval Number:    Date Approved/Denied:   PASRR Number: [id number]  Discharge Plan: SNF    Current Diagnoses: Patient Active Problem List   Diagnosis Date Noted  . Acute metabolic encephalopathy 02/23/2018  . Respiratory failure (HCC) 10/04/2017  . PNA (pneumonia) 09/09/2017  . Adjustment disorder with mixed anxiety and depressed mood   . Hyponatremia 06/24/2017  . Hematemesis without nausea   . Pressure injury of skin 09/14/2016  . Weakness 09/13/2016  . UTI (urinary tract infection) 09/13/2016  . Chronic diastolic CHF (congestive heart failure) (HCC) 09/13/2016  . COPD (chronic obstructive pulmonary disease) (HCC) 09/13/2016  . Carotid stenosis 09/09/2016  . Carotid artery stenosis 08/23/2016  . Pancytopenia (HCC) 08/23/2016  . Hyperkalemia 08/23/2016  . Leukocytosis 07/27/2016  . Thrombocytopenia (HCC) 07/27/2016  . Anemia 07/27/2016  . 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 Dysphagia, pharyngoesophageal phase 05/18/2016  . GERD without esophagitis 05/09/2016  . Bilateral lower extremity edema 05/09/2016  . Slow transit constipation 05/09/2016  . Depression with anxiety 05/09/2016  . Respiratory failure with hypoxia and hypercapnia (HCC) 07/22/2015  . Cervical vertebral fusion   . Depression   . Chronic neck and back pain   . COPD  exacerbation (HCC)   . Congestive dilated cardiomyopathy (HCC)   . Arthritis of right hip 08/04/2014  . Essential hypertension 08/04/2014  . Hypothyroidism 08/04/2014  . Syncope 08/04/2014  . Fall 08/04/2014    Orientation RESPIRATION BLADDER Height & Weight     Self, Time, Situation, Place  O2(4L o2) Continent Weight: 243 lb 9.7 oz (110.5 kg) Height:  5\' 10"  (177.8 cm)  BEHAVIORAL SYMPTOMS/MOOD NEUROLOGICAL BOWEL NUTRITION STATUS      Continent Diet(Heart Healthy)  AMBULATORY STATUS COMMUNICATION OF NEEDS Skin   Extensive Assist Verbally Normal                       Personal Care Assistance Level of Assistance  Bathing, Feeding, Dressing Bathing Assistance: Limited assistance Feeding assistance: Independent Dressing Assistance: Limited assistance     Functional Limitations Info  Sight, Hearing, Speech Sight Info: Adequate Hearing Info: Adequate Speech Info: Adequate    SPECIAL CARE FACTORS FREQUENCY  PT (By licensed PT)     PT Frequency: Up to 5X per day              Contractures Contractures Info: Not present    Additional Factors Info  Code Status, Allergies Code Status Info: DNR Allergies Info: Ampicillin, Nortriptyline, Brimonidine, Cyclobenzaprine, Tizanidine, Gabapentin           Current Medications (02/26/2018):  This is the current hospital active medication list Current Facility-Administered Medications  Medication Dose Route Frequency Provider Last Rate Last Dose  . acetaminophen (TYLENOL) tablet 650 mg  650 mg Oral Q6H PRN Auburn Bilberry, MD   650 mg at 02/26/18 1138  . amLODipine (NORVASC) tablet 5 mg  5  mg Oral Daily Enedina Finner, MD   5 mg at 02/26/18 0844  . aspirin EC tablet 81 mg  81 mg Oral Daily Enedina Finner, MD   81 mg at 02/26/18 0844  . baclofen (LIORESAL) tablet 10 mg  10 mg Oral BID PRN Enedina Finner, MD   10 mg at 02/26/18 1433  . cefdinir (OMNICEF) capsule 300 mg  300 mg Oral Q12H Auburn Bilberry, MD   300 mg at 02/26/18 1433   . clopidogrel (PLAVIX) tablet 75 mg  75 mg Oral Daily Enedina Finner, MD   75 mg at 02/26/18 0843  . enoxaparin (LOVENOX) injection 40 mg  40 mg Subcutaneous Q24H Altamese Dilling, MD   40 mg at 02/25/18 2300  . guaiFENesin (MUCINEX) 12 hr tablet 600 mg  600 mg Oral BID PRN Oralia Manis, MD   600 mg at 02/26/18 1531  . hydrALAZINE (APRESOLINE) injection 10 mg  10 mg Intravenous Q6H PRN Auburn Bilberry, MD   10 mg at 02/26/18 1435  . ipratropium-albuterol (DUONEB) 0.5-2.5 (3) MG/3ML nebulizer solution 3 mL  3 mL Nebulization Q4H PRN Enedina Finner, MD      . levothyroxine (SYNTHROID, LEVOTHROID) tablet 200 mcg  200 mcg Oral QAC breakfast Enedina Finner, MD   200 mcg at 02/26/18 [phone]  . methylPREDNISolone sodium succinate (SOLU-MEDROL) 40 mg/mL injection 40 mg  40 mg Intravenous Q8H Samaan, Maged, MD   40 mg at 02/26/18 1333  . metoprolol tartrate (LOPRESSOR) tablet 25 mg  25 mg Oral BID Enedina Finner, MD   25 mg at 02/26/18 0844  . mometasone-formoterol (DULERA) 200-5 MCG/ACT inhaler 2 puff  2 puff Inhalation BID Enedina Finner, MD   2 puff at 02/26/18 [phone]  . multivitamin with minerals tablet 1 tablet  1 tablet Oral Daily Enedina Finner, MD   1 tablet at 02/26/18 0844  . tiotropium (SPIRIVA) inhalation capsule 18 mcg  18 mcg Inhalation Daily Enedina Finner, MD   18 mcg at 02/26/18 5409     Discharge Medications: Please see discharge summary for a list of discharge medications.  Relevant Imaging Results:  Relevant Lab Results:   Additional Information SS# [id number]  Judi Cong, LCSW

## 2018-02-26 NOTE — Clinical Social Work Note (Signed)
Clinical Social Work Assessment  Patient Details  Name: Chad Huffman MRN: [id number] Date of Birth: 21-Feb-1946  Date of referral:  02/26/18               Reason for consult:  Facility Placement                Permission sought to share information with:  Chartered certified accountant granted to share information::  Yes, Verbal Permission Granted  Name::        Agency::  [place] SNFs except for [facility]  Relationship::     Contact Information:     Housing/Transportation Living arrangements for the past 2 months:  [place] of Information:  Patient, Medical Team Patient Interpreter Needed:  None Criminal Activity/Legal Involvement Pertinent to Current Situation/Hospitalization:  No - Comment as needed Significant Relationships:  Siblings, Friend, Adult Children, Community Support Lives with:  Self Do you feel safe going back to the place where you live?  Yes Need for family participation in patient care:  No (Coment)  Care giving concerns:  PT recommendation for SNF   Social Worker assessment / plan:  The CSW met with the patient at bedside to discuss PT recommendation. The patient remembered the CSW from past admissions and verbalized consent to begin the referral with the exception of [facility] due to past experiences. The patient confirmed that his brother is still his primary HCPOA. The patient shared that [facility] is his preferred SNF. The CSW has begun the referral process and will provide bed offers as available. The CSW is following for discharge facilitation.  Employment status:  Retired Forensic scientist:  [employer] PT Recommendations:  [place] / Referral to community resources:  [place]  Patient/Family's Response to care:  The patient thanked the CSW.  Patient/Family's Understanding of and Emotional Response to Diagnosis, Current  Treatment, and Prognosis:  The patient understands his need for SNF level of care and is in agreement with such.   Emotional Assessment Appearance:  Appears stated age Attitude/Demeanor/Rapport:  Gracious, Engaged Affect (typically observed):  Stable Orientation:  Oriented to Self, Oriented to Place, Oriented to  Time, Oriented to Situation Alcohol / Substance use:  Never Used Psych involvement (Current and /or in the community):  No (Comment)  Discharge Needs  Concerns to be addressed:  Discharge Planning Concerns, Care Coordination Readmission within the last 30 days:  No Current discharge risk:  Chronically ill Barriers to Discharge:  Continued Medical Work up   [employer], LCSW 02/26/2018, 5:05 PM

## 2018-02-26 NOTE — Progress Notes (Addendum)
[employer] - [place] at [facility]   PATIENT NAME: Chad Huffman    MR#:  [id number]  DATE OF BIRTH:  [birth year]  SUBJECTIVE:  CHIEF COMPLAINT:   Chief Complaint  Patient presents with  . Altered Mental Status   Pt doing little better, still have pain  REVIEW OF SYSTEMS:  CONSTITUTIONAL: No fever, fatigue or weakness.  EYES: No blurred or double vision.  EARS, NOSE, AND THROAT: No tinnitus or ear pain.  RESPIRATORY: No cough, shortness of breath, wheezing or hemoptysis.  CARDIOVASCULAR: No chest pain, orthopnea, edema.  GASTROINTESTINAL: No nausea, vomiting, diarrhea or abdominal pain.  GENITOURINARY: No dysuria, hematuria.  ENDOCRINE: No polyuria, nocturia,  HEMATOLOGY: No anemia, easy bruising or bleeding SKIN: No rash or lesion. MUSCULOSKELETAL: Left foot pain knee pain  nEUROLOGIC: No tingling, numbness, weakness.  PSYCHIATRY: No anxiety or depression.   ROS  DRUG ALLERGIES:   Allergies  Allergen Reactions  . Ampicillin Swelling and Other (See Comments)    Unable to obtain enough information to answer additional questions about this medication.  Has patient had a PCN reaction causing immediate rash, facial/tongue/throat swelling, SOB or lightheadedness with hypotension: Yes Has patient had a PCN reaction causing severe rash involving mucus membranes or skin necrosis: No Has patient had a PCN reaction that required hospitalization No Has patient had a PCN reaction occurring within the last 10 years: Yes If all of the above answers are "NO", then may pr  . Nortriptyline Other (See Comments)    Sleep walking  . Brimonidine Other (See Comments)    Unknown reaction per MAR   . Cyclobenzaprine Other (See Comments)    Hallucinations  . Tizanidine Other (See Comments)    Hallucinations  . Gabapentin Rash    VITALS:  Blood pressure (!) 185/83, pulse 71, temperature 98 F (36.7 C), temperature source Oral, resp. rate 18, height 5\' 10"  (1.778 m), weight  110.5 kg, SpO2 97 %.  PHYSICAL EXAMINATION:   GENERAL:  72 y.o.-year-old patient lying in the bed with no acute distress. EYES: Pupils equal, round, reactive to light and accommodation. No scleral icterus. Extraocular muscles intact.  HEENT: Head atraumatic, normocephalic. Oropharynx and nasopharynx clear.  NECK:  Supple, no jugular venous distention. No thyroid enlargement, no tenderness. Previous tracheostomy LUNGS: shallow breath sounds bilaterally, no wheezing, rales,rhonchi or crepitation. No use of accessory muscles of respiration.  CARDIOVASCULAR: S1, S2 normal. No murmurs, rubs, or gallops. tachycardia ABDOMEN: Soft, nontender, nondistended. Bowel sounds present. No organomegaly or mass.  EXTREMITIES: chronic left lower extremity foot deformity with bruises present dry skin. Previous foot surgery changes noted NEUROLOGIC: Patient is alert and oriented, no cranial nerve deficits, follows simple command and power is 4 out of 5 in all limbs.  PSYCHIATRIC:  Alert and oriented x3.   SKIN: No obvious rash, lesion, or ulcer.   Physical Exam LABORATORY PANEL:   CBC Recent Labs  Lab 02/24/18 0525  WBC 8.9  HGB 10.5*  HCT 31.1*  PLT 121*   ------------------------------------------------------------------------------------------------------------------  Chemistries  Recent Labs  Lab 02/23/18 1053 02/24/18 0525  NA 138 141  K 4.2 4.7  CL 95* 99  CO2 35* 34*  GLUCOSE 110* 127*  BUN 21 18  CREATININE 1.09 0.85  CALCIUM 9.2 8.6*  MG  --  2.3  AST 23  --   ALT 15  --   ALKPHOS 64  --   BILITOT 2.3*  --    ------------------------------------------------------------------------------------------------------------------  Cardiac Enzymes Recent Labs  Lab  02/23/18 1053  TROPONINI 0.03*   ------------------------------------------------------------------------------------------------------------------  RADIOLOGY:  Dg Knee Complete 4 Views Left  Result Date:  02/25/2018 CLINICAL DATA:  Fall 2 days ago. EXAM: LEFT KNEE - COMPLETE 4+ VIEW COMPARISON:  None. FINDINGS: There is an apparent fracture through the proximal fibula best seen on the lateral view. No other fractures. No joint effusion. Minimal degenerative changes. IMPRESSION: Nondisplaced proximal fibular fracture.  Mild degenerative changes. Electronically Signed   By: Gerome Sam III M.D   On: 02/25/2018 16:33    ASSESSMENT AND PLAN:   Active Problems:   Acute metabolic encephalopathy  [name]  is a 72 y.o. male with a known history of chronic respiratory failure on chronic home oxygen, CHF, peripheral neuropathy comes to the emergency room after he was found by neighbors standing naked on his porch and confused. EMS was called the found his sats in the 70s. They also noted patient's oxygen tubing was kinked and stuck in his door. Found to have some deformities in his left foot with no bruising on his left foot. Cording to patient's brother a friend informed that patient's coffee table was knocked out in the house and there is a possibility patient may have fallen.  1. acute encephalopathy/altered mental status in the setting of hypoxia acute on chronic hypercapnia hypoxic respiratory failure secondary to COPD exacerbation along with UTI Doing better Continue supportive care  2. sepsis due to UTI due to Serratia -change to oral omicef -chest x-ray negative for pneumonia -follow blood culture and urine culture- negative so far.  3. Chronic left foot deformity with significant amount of bruising possibly superficial trauma from suspected fall at home -CT left foot no fracture -chronic changes of Charcot's joint -xray with non displaced fracture ortho consult  4. Hypertension continue home meds  5. DVT prophylaxis subcu Lovenox   All the records are reviewed and case discussed with Care Management/Social Workerr. Management plans discussed with the patient, family and they  are in agreement.  CODE STATUS: DNR  TOTAL TIME TAKING CARE OF THIS PATIENT: 35 minutes.     POSSIBLE D/C IN 1-2 DAYS, DEPENDING ON CLINICAL CONDITION.   Auburn Bilberry M.D on 02/26/2018   Between 7am to 6pm - Pager - [phone]  After 6pm go to [email or web address] - password [email or web address]  [facility]  Office  [phone]  CC: Primary care physician; Dorothey Baseman, MD  Note: This dictation was prepared with Dragon dictation along with smaller phrase technology. Any transcriptional errors that result from this process are unintentional.

## 2018-02-26 NOTE — Plan of Care (Signed)

## 2018-02-27 ENCOUNTER — Inpatient Hospital Stay: Payer: Medicare Other

## 2018-02-27 MED ORDER — FUROSEMIDE 20 MG PO TABS
20.0000 mg | ORAL_TABLET | Freq: Every day | ORAL | Status: DC
  Administered 2018-02-28: 20 mg via ORAL
  Filled 2018-02-27: qty 1

## 2018-02-27 NOTE — Consult Note (Signed)
Reason for Consult: Injury left foot Referring Physician: Messiah Lozinski is an 72 y.o. male.  HPI: This is a 72 year old male with a history of a fall that happened last Thursday when his knee gave out and he fell twisting his left foot.  Subsequently he was admitted to the hospital.  He did have x-rays and a CT scan which did reveal fracture dislocation around the talonavicular and calcaneocuboid joint areas.  Past Medical History:  Diagnosis Date  . Antalgic gait   . Carotid artery occlusion   . CHF (congestive heart failure) (HCC)   . Chronic neck and back pain   . Complication of anesthesia    pt has had cervical fusion-limited neck flextion  . COPD (chronic obstructive pulmonary disease) (HCC)   . Depression   . DJD (degenerative joint disease)    Right shoulder  . Hypertension   . Lumbago   . Peripheral neuropathy     Past Surgical History:  Procedure Laterality Date  . ABDOMINAL AORTIC ANEURYSM REPAIR  2009   Endovascular AAA repair  . ABDOMINAL AORTIC ANEURYSM REPAIR    . CAROTID ANGIOGRAPHY  09/09/2016   Procedure: Carotid Angiography;  Surgeon: Nada Libman, MD;  [facility] CV [facility];  Service: Cardiovascular;;  rt. carotid  . CARPAL TUNNEL RELEASE     bilateral CTS  . ESOPHAGOGASTRODUODENOSCOPY (EGD) WITH PROPOFOL N/A 09/28/2016   Procedure: ESOPHAGOGASTRODUODENOSCOPY (EGD) WITH PROPOFOL;  Surgeon: Midge Minium, MD;  Location: [facility] ENDOSCOPY;  Service: Endoscopy;  Laterality: N/A;  . HAMMER TOE SURGERY  12/10/2011   Procedure: HAMMER TOE CORRECTION;  Surgeon: Ernestene Kiel, DPM;  Location: [facility];  Service: Podiatry;  Laterality: Right;  Right hammertoe repair second right with 2.5x42 orthopro screw   . IR GENERIC HISTORICAL  04/05/2016   IR GASTROSTOMY TUBE MOD SED 04/05/2016 [facility]  . IR GENERIC HISTORICAL  05/12/2016   IR CM INJ ANY COLONIC TUBE W/FLUORO 05/12/2016 Malachy Moan, MD [facility]  . IR GENERIC HISTORICAL   05/26/2016   IR CM INJ ANY COLONIC TUBE W/FLUORO 05/26/2016 [facility]  . IR GENERIC HISTORICAL  05/28/2016   IR REPLC GASTRO/COLONIC TUBE PERCUT W/FLUORO 05/28/2016 Brayton El, PA-C [facility]  . IR GENERIC HISTORICAL  07/02/2016   IR GASTROSTOMY TUBE REMOVAL 07/02/2016 Brayton El, PA-C [facility]  . METATARSAL OSTEOTOMY  12/10/2011   Procedure: METATARSAL OSTEOTOMY;  Surgeon: Ernestene Kiel, DPM;  Location: [facility];  Service: Podiatry;  Laterality: Right;  Right Keller arthroplasty with size 5 silicone implant right great toe; second metatarsal osteotomy with 2.0x14 screw  . NECK SURGERY    . PERIPHERAL VASCULAR CATHETERIZATION N/A 02/11/2015   Procedure: Carotid Angiography;  Surgeon: Nada Libman, MD;  Location: [facility] INVASIVE CV LAB;  Service: Cardiovascular;  Laterality: N/A;  . PILONIDAL CYST / SINUS EXCISION    . Rotary cuff    . SPINE SURGERY  09/15/11   Scar tissue removed- back  . SPINE SURGERY  09/15/11   Scar tissue removed- back  . TONSILLECTOMY    . TRACHEOSTOMY TUBE PLACEMENT N/A 03/03/2016   Procedure: TRACHEOSTOMY;  Surgeon: Geanie Logan, MD;  Location: [facility] ORS;  Service: ENT;  Laterality: N/A;    Family History  Problem Relation Age of Onset  . Stomach cancer Mother   . Hypertension Brother   . Heart attack Father     Social History:  reports that he quit smoking about 11 years ago. His smoking  use included cigarettes. He has a 30.00 pack-year smoking history. He has never used smokeless tobacco. He reports that he drinks about 2.0 standard drinks of alcohol per week. He reports that he does not use drugs.  Allergies:  Allergies  Allergen Reactions  . Ampicillin Swelling and Other (See Comments)    Unable to obtain enough information to answer additional questions about this medication.  Has patient had a PCN reaction causing immediate rash, facial/tongue/throat swelling, SOB or lightheadedness with hypotension: Yes Has patient had a PCN  reaction causing severe rash involving mucus membranes or skin necrosis: No Has patient had a PCN reaction that required hospitalization No Has patient had a PCN reaction occurring within the last 10 years: Yes If all of the above answers are "NO", then may pr  . Nortriptyline Other (See Comments)    Sleep walking  . Brimonidine Other (See Comments)    Unknown reaction per MAR   . Cyclobenzaprine Other (See Comments)    Hallucinations  . Tizanidine Other (See Comments)    Hallucinations  . Gabapentin Rash    Medications:  Scheduled: . amLODipine  5 mg Oral Daily  . aspirin EC  81 mg Oral Daily  . cefdinir  300 mg Oral Q12H  . clopidogrel  75 mg Oral Daily  . enoxaparin (LOVENOX) injection  40 mg Subcutaneous Q24H  . [START ON 02/28/2018] furosemide  20 mg Oral Daily  . levothyroxine  200 mcg Oral QAC breakfast  . methylPREDNISolone (SOLU-MEDROL) injection  40 mg Intravenous Q8H  . metoprolol tartrate  25 mg Oral BID  . mometasone-formoterol  2 puff Inhalation BID  . multivitamin with minerals  1 tablet Oral Daily  . tiotropium  18 mcg Inhalation Daily    No results found for this or any previous visit (from the past 48 hour(s)).  Dg Chest Port 1 View  Result Date: 02/27/2018 CLINICAL DATA:  Shortness of breath and right-sided chest pain. EXAM: PORTABLE CHEST 1 VIEW COMPARISON:  Chest CT 02/23/2018 FINDINGS: Mild right basilar atelectasis with elevation of the right hemidiaphragm. Old healed left lateral rib fractures. Moderate cardiomegaly. Atherosclerotic calcification of the aortic arch. Upper thoracic/lower cervical posterolateral rod and facet and pedicle screw fixator. Prominence of the main pulmonary artery favoring pulmonary arterial hypertension. IMPRESSION: 1. Atelectasis at the right lung base with elevated right hemidiaphragm. If clinically warranted, fluoroscopic sniff test could be utilized to assess for diaphragmatic paralysis/dysfunction. 2. Moderate cardiomegaly,  without edema. 3.  Aortic Atherosclerosis (ICD10-I70.0). 4. Prominent main pulmonary artery favoring pulmonary arterial hypertension. Electronically Signed   By: Gaylyn Rong M.D.   On: 02/27/2018 10:57    Review of Systems  Constitutional: Negative for chills and fever.  HENT: Negative.   Eyes: Negative.   Respiratory: Negative.   Cardiovascular: Negative.   Gastrointestinal: Negative for nausea and vomiting.  Genitourinary: Negative.   Musculoskeletal:       Pain in the left foot with injury last Thursday.  Skin:       Bruising and swelling in the left foot and ankle  Neurological:       Patient denies numbness or paresthesias.  Endo/Heme/Allergies: Negative.   Psychiatric/Behavioral: Negative.    Blood pressure (!) 153/68, pulse 75, temperature 97.6 F (36.4 C), temperature source Oral, resp. rate 20, height 5\' 10"  (1.778 m), weight 110.5 kg, SpO2 94 %. Physical Exam  Cardiovascular:  DP and PT pulses are palpable.  Capillary filling time intact.  Musculoskeletal:  Guarded and significantly limited range  of motion in the left foot and ankle.  Normal on the right.  Exquisite pain on attempted motion in the midfoot or on palpation around the talonavicular and calcaneocuboid area.  Muscle testing deferred.  Obvious acute foot deformity in the left foot as compared to the right.  Dorsally contracted hallux bilateral.  Neurological:  Protective threshold with monofilament wire appears to be grossly intact and symmetric.  Proprioception intact.  Skin:  The skin is warm dry and supple.  Significant bruising and ecchymosis in the left foot is compared to the right.  Dried blood with recent bleeding around the left great toe dorsally.  No active ulceration noted.  All of the toenails are significantly thick, dystrophic, discolored, brittle with subungual debris.    Assessment/Plan: Assessment: Fracture dislocation left rear foot at the talonavicular and calcaneocuboid  joints.  Plan: Ordered a cam walker boot for the left leg for immobilization.  This will need to be sent over from [facility] in [place].  If not able to send this over he will at least need a posterior splint on the left leg that can be applied by the [employer] from the ED.  Patient should be able to begin physical therapy for nonweightbearing for crutches on the left foot using the cam walker or limited weightbearing with pressure only on the heel if needed.  Patient is an active patient with [facility] with Dr. Al Corpus and recommended that he follow-up with Dr. Al Corpus upon discharge.  Ricci Barker 02/27/2018, 5:46 PM

## 2018-02-27 NOTE — Progress Notes (Signed)
PT Cancellation Note  Patient Details Name: Chad Huffman MRN: [id number] DOB: 1945/08/30   Cancelled Treatment:    Reason Eval/Treat Not Completed: Medical issues which prohibited therapy: MD placed ortho consult 9/8 secondary to LLE non-displaced fibular fracture.  Will hold PT this date pending completion of ortho consult and resulting orthopedic recommendations/restrictions.     Ovidio Hanger PT, DPT 02/27/18, 11:23 AM

## 2018-02-27 NOTE — Care Management Important Message (Signed)
Copy of signed IM left with patient in room.  

## 2018-02-27 NOTE — Plan of Care (Signed)

## 2018-02-27 NOTE — Progress Notes (Signed)
[employer] - [place] at [facility]   PATIENT NAME: Chad Huffman    MR#:  [id number]  DATE OF BIRTH:  Mar 23, 1946  SUBJECTIVE:  CHIEF COMPLAINT:   Chief Complaint  Patient presents with  . Altered Mental Status   Pt doing little better, still have pain  REVIEW OF SYSTEMS:  CONSTITUTIONAL: No fever, fatigue or weakness.  EYES: No blurred or double vision.  EARS, NOSE, AND THROAT: No tinnitus or ear pain.  RESPIRATORY: No cough, shortness of breath, wheezing or hemoptysis.  CARDIOVASCULAR: No chest pain, orthopnea, edema.  GASTROINTESTINAL: No nausea, vomiting, diarrhea or abdominal pain.  GENITOURINARY: No dysuria, hematuria.  ENDOCRINE: No polyuria, nocturia,  HEMATOLOGY: No anemia, easy bruising or bleeding SKIN: No rash or lesion. MUSCULOSKELETAL: Left foot pain knee pain  nEUROLOGIC: No tingling, numbness, weakness.  PSYCHIATRY: No anxiety or depression.   ROS  DRUG ALLERGIES:   Allergies  Allergen Reactions  . Ampicillin Swelling and Other (See Comments)    Unable to obtain enough information to answer additional questions about this medication.  Has patient had a PCN reaction causing immediate rash, facial/tongue/throat swelling, SOB or lightheadedness with hypotension: Yes Has patient had a PCN reaction causing severe rash involving mucus membranes or skin necrosis: No Has patient had a PCN reaction that required hospitalization No Has patient had a PCN reaction occurring within the last 10 years: Yes If all of the above answers are "NO", then may pr  . Nortriptyline Other (See Comments)    Sleep walking  . Brimonidine Other (See Comments)    Unknown reaction per MAR   . Cyclobenzaprine Other (See Comments)    Hallucinations  . Tizanidine Other (See Comments)    Hallucinations  . Gabapentin Rash    VITALS:  Blood pressure (!) 153/68, pulse 75, temperature 97.6 F (36.4 C), temperature source Oral, resp. rate 20, height 5\' 10"  (1.778 m),  weight 110.5 kg, SpO2 94 %.  PHYSICAL EXAMINATION:   GENERAL:  72 y.o.-year-old patient lying in the bed with no acute distress. EYES: Pupils equal, round, reactive to light and accommodation. No scleral icterus. Extraocular muscles intact.  HEENT: Head atraumatic, normocephalic. Oropharynx and nasopharynx clear.  NECK:  Supple, no jugular venous distention. No thyroid enlargement, no tenderness. Previous tracheostomy LUNGS: shallow breath sounds bilaterally, no wheezing, rales,rhonchi or crepitation. No use of accessory muscles of respiration.  CARDIOVASCULAR: S1, S2 normal. No murmurs, rubs, or gallops. tachycardia ABDOMEN: Soft, nontender, nondistended. Bowel sounds present. No organomegaly or mass.  EXTREMITIES: chronic left lower extremity foot deformity with bruises present dry skin. Previous foot surgery changes noted NEUROLOGIC: Patient is alert and oriented, no cranial nerve deficits, follows simple command and power is 4 out of 5 in all limbs.  PSYCHIATRIC:  Alert and oriented x3.   SKIN: No obvious rash, lesion, or ulcer.   Physical Exam LABORATORY PANEL:   CBC Recent Labs  Lab 02/24/18 0525  WBC 8.9  HGB 10.5*  HCT 31.1*  PLT 121*   ------------------------------------------------------------------------------------------------------------------  Chemistries  Recent Labs  Lab 02/23/18 1053 02/24/18 0525  NA 138 141  K 4.2 4.7  CL 95* 99  CO2 35* 34*  GLUCOSE 110* 127*  BUN 21 18  CREATININE 1.09 0.85  CALCIUM 9.2 8.6*  MG  --  2.3  AST 23  --   ALT 15  --   ALKPHOS 64  --   BILITOT 2.3*  --    ------------------------------------------------------------------------------------------------------------------  Cardiac Enzymes Recent Labs  Lab  02/23/18 1053  TROPONINI 0.03*   ------------------------------------------------------------------------------------------------------------------  RADIOLOGY:  Dg Chest Port 1 View  Result Date:  02/27/2018 CLINICAL DATA:  Shortness of breath and right-sided chest pain. EXAM: PORTABLE CHEST 1 VIEW COMPARISON:  Chest CT 02/23/2018 FINDINGS: Mild right basilar atelectasis with elevation of the right hemidiaphragm. Old healed left lateral rib fractures. Moderate cardiomegaly. Atherosclerotic calcification of the aortic arch. Upper thoracic/lower cervical posterolateral rod and facet and pedicle screw fixator. Prominence of the main pulmonary artery favoring pulmonary arterial hypertension. IMPRESSION: 1. Atelectasis at the right lung base with elevated right hemidiaphragm. If clinically warranted, fluoroscopic sniff test could be utilized to assess for diaphragmatic paralysis/dysfunction. 2. Moderate cardiomegaly, without edema. 3.  Aortic Atherosclerosis (ICD10-I70.0). 4. Prominent main pulmonary artery favoring pulmonary arterial hypertension. Electronically Signed   By: Gaylyn Rong M.D.   On: 02/27/2018 10:57    ASSESSMENT AND PLAN:   Active Problems:   Acute metabolic encephalopathy  Chad Huffman  is a 72 y.o. male with a known history of chronic respiratory failure on chronic home oxygen, CHF, peripheral neuropathy comes to the emergency room after he was found by neighbors standing naked on his porch and confused. EMS was called the found his sats in the 70s. They also noted patient's oxygen tubing was kinked and stuck in his door. Found to have some deformities in his left foot with no bruising on his left foot. Cording to patient's brother a friend informed that patient's coffee table was knocked out in the house and there is a possibility patient may have fallen.  1. acute encephalopathy/altered mental status in the setting of hypoxia acute on chronic hypercapnia hypoxic respiratory failure secondary to COPD exacerbation along with UTI Mental status better  2. sepsis due to UTI due to Serratia -Continue oral Omnicef -chest x-ray negative for pneumonia -follow blood culture and  urine culture- negative so far.  3. Chronic left foot deformity with significant amount of bruising possibly superficial trauma from suspected fall at home -CT left foot no fracture -chronic changes of Charcot's joint -xray with non displaced fracture ortho consult done today recommended podiatry consult we have asked podiatry to see  4. Hypertension continue home meds  5. DVT prophylaxis subcu Lovenox   All the records are reviewed and case discussed with Care Management/Social Workerr. Management plans discussed with the patient, family and they are in agreement.  CODE STATUS: DNR  TOTAL TIME TAKING CARE OF THIS PATIENT: 35 minutes.     POSSIBLE D/C IN 1-2 DAYS, DEPENDING ON CLINICAL CONDITION.   Auburn Bilberry M.D on 02/27/2018   Between 7am to 6pm - Pager - [phone]  After 6pm go to [email or web address] - password [employer]  [facility] [place] [facility]  Office  [phone]  CC: Primary care physician; Dorothey Baseman, MD  Note: This dictation was prepared with Dragon dictation along with smaller phrase technology. Any transcriptional errors that result from this process are unintentional.

## 2018-02-28 LAB — CULTURE, BLOOD (ROUTINE X 2)
CULTURE: NO GROWTH
Culture: NO GROWTH
SPECIAL REQUESTS: ADEQUATE
SPECIAL REQUESTS: ADEQUATE

## 2018-02-28 MED ORDER — LOSARTAN POTASSIUM 50 MG PO TABS: 50.0000 mg | ORAL_TABLET | Freq: Every day | ORAL | Status: DC

## 2018-02-28 MED ORDER — GUAIFENESIN ER 600 MG PO TB12: 600.0000 mg | ORAL_TABLET | Freq: Two times a day (BID) | ORAL | Status: AC

## 2018-02-28 MED ORDER — AMLODIPINE BESYLATE 5 MG PO TABS
5.0000 mg | ORAL_TABLET | Freq: Once | ORAL | Status: AC
  Administered 2018-02-28: 5 mg via ORAL
  Filled 2018-02-28: qty 1

## 2018-02-28 MED ORDER — OXYCODONE HCL 5 MG PO TABS: 5.0000 mg | ORAL_TABLET | Freq: Four times a day (QID) | ORAL | 0 refills | Status: AC | PRN

## 2018-02-28 MED ORDER — CEFDINIR 300 MG PO CAPS: 300.0000 mg | ORAL_CAPSULE | Freq: Two times a day (BID) | ORAL | 0 refills | Status: AC

## 2018-02-28 MED ORDER — PREDNISONE 10 MG (21) PO TBPK: ORAL_TABLET | ORAL | Status: DC

## 2018-02-28 MED ORDER — ACETAMINOPHEN 325 MG PO TABS: 650.0000 mg | ORAL_TABLET | Freq: Four times a day (QID) | ORAL | Status: AC | PRN

## 2018-02-28 MED ORDER — AMLODIPINE BESYLATE 10 MG PO TABS: 10.0000 mg | ORAL_TABLET | Freq: Every day | ORAL | Status: DC

## 2018-02-28 NOTE — Clinical Social Work Placement (Signed)
   CLINICAL SOCIAL WORK PLACEMENT  NOTE  Date:  02/28/2018  Patient Details  Name: Chad Huffman MRN: [id number] Date of Birth: 15-Mar-1946  Clinical Social Work is seeking post-discharge placement for this patient at the Skilled  Nursing Facility level of care (*CSW will initial, date and re-position this form in  chart as items are completed):  Yes   Patient/family provided with [facility] Clinical Social Work Department's list of facilities offering this level of care within the geographic area requested by the patient (or if unable, by the patient's family).  Yes   Patient/family informed of their freedom to choose among providers that offer the needed level of care, that participate in Medicare, Medicaid or managed care program needed by the patient, have an available bed and are willing to accept the patient.  Yes   Patient/family informed of [facility]'s ownership interest in [facility] and [facility], as well as of the fact that they are under no obligation to receive care at these facilities.  PASRR submitted to EDS on       PASRR number received on       Existing PASRR number confirmed on 02/26/18     FL2 transmitted to all facilities in geographic area requested by pt/family on 02/26/18     FL2 transmitted to all facilities within larger geographic area on       Patient informed that his/her managed care company has contracts with or will negotiate with certain facilities, including the following:        Yes   Patient/family informed of bed offers received.  Patient chooses bed at [facility])     Physician recommends and patient chooses bed at [facility])    Patient to be transferred to ([facility]) on 02/28/18.  Patient to be transferred to facility by ([facility])     Patient family notified on 02/28/18 of transfer.  Name of family member notified:  (brother)     PHYSICIAN       Additional Comment:    _______________________________________________ [name], LCSW 02/28/2018, 2:45 PM

## 2018-02-28 NOTE — Clinical Social Work Note (Signed)
Patient to discharge today to go to [facility]. Patient is aware and in agreement. Patient requested I call his brother to notify him of the discharge. CSW spoke with his brother via phone and he took the information down. CSW spoke with Tammy at [facility] and sent the discharge information. [name] MSW,LCSW [phone]

## 2018-02-28 NOTE — Evaluation (Signed)
Physical Therapy Re-Evaluation Patient Details Name: Chad Huffman MRN: [id number] DOB: Mar 26, 1946 Today's Date: 02/28/2018   History of Present Illness  Pt is a 72 yo male with admission for acute metabolic encephalopathy, UTI, elevated troponin and noted lunate dislocation L wrist.  Pt sustained a fall recently with fracture dislocation around the left talonavicular and calcaneocuboid joint areas as well as a proximal left fibular fracture.  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    Clinical Impression  Re-evaluation secondary to new diagnosis of L foot/ankle fractures with a change to patient's weight bearing status.  Pt presents with deficits in strength, transfers, mobility, gait, balance, and activity tolerance.  Pt required min A for bed mobility tasks and transfers with verbal cues for sequencing for weight bearing compliance.  Pt able to stand at the EOB with L foot elevated from the floor but had significant difficulty taking small steps with the RW without having his L heel on the floor. Pt only able to take 2-3 very small steps at the EOB with extensive cues for sequencing before fatiguing and requiring to return to sitting.  Pt will benefit from PT services in a SNF setting upon discharge to safely address above deficits for decreased caregiver assistance and eventual return to PLOF.       Follow Up Recommendations SNF    Equipment Recommendations  Other (comment)(TBD at next venue of care)    Recommendations for Other Services       Precautions / Restrictions Precautions Precautions: Fall Required Braces or Orthoses: Other Brace/Splint Other Brace/Splint: Cam boot to LLE Restrictions Weight Bearing Restrictions: Yes LLE Weight Bearing: Non weight bearing Other Position/Activity Restrictions: Per Dr. Alberteen Spindle patient to be NWB through the LLE if possible.  If pt unable to maintain NWB status patient may be TTWB  through the left heel only but limit this as much as possible.  Per Dr. Allena Katz no additional WB restrictions secondary to the left proximal fibular fracture.        Mobility  Bed Mobility Overal bed mobility: Needs Assistance Bed Mobility: Supine to Sit;Sit to Supine     Supine to sit: Min assist Sit to supine: Min assist   General bed mobility comments: Min A for BLEs in and out of bed  Transfers Overall transfer level: Needs assistance Equipment used: Rolling walker (2 wheeled) Transfers: Sit to/from Stand Sit to Stand: Min assist;From elevated surface         General transfer comment: Min A for stability with mod verbal cues for sequencing for WB compliance  Ambulation/Gait Ambulation/Gait assistance: Min assist Gait Distance (Feet): 1 Feet Assistive device: Rolling walker (2 wheeled) Gait Pattern/deviations: Step-to pattern     General Gait Details: Mod verbal and tactile cues for proper sequencing with amb to maintain WB status with min A to guide RW  Stairs            Wheelchair Mobility    Modified Rankin (Stroke Patients Only)       Balance Overall balance assessment: History of Falls;Needs assistance Sitting-balance support: Feet supported Sitting balance-Leahy Scale: Good     Standing balance support: Bilateral upper extremity supported;During functional activity Standing balance-Leahy Scale: Poor Standing balance comment: Heavy use of BUEs on RW for stability                             Pertinent Vitals/Pain Pain  Assessment: No/denies pain    Home Living Family/patient expects to be discharged to:: Private residence Living Arrangements: Alone Available Help at Discharge: Friend(s);Available PRN/intermittently Type of Home: House Home Access: Stairs to enter Entrance Stairs-Rails: Right;Left;Can reach both Entrance Stairs-Number of Steps: 4 Home Layout: One level Home Equipment: Walker - 2 wheels;Wheelchair - manual;Cane -  single point;Bedside commode;Shower seat      Prior Function Level of Independence: Needs assistance   Gait / Transfers Assistance Needed: Indep with RW with recent falls  ADL's / Homemaking Assistance Needed: able to do his own dressing but has bathing assist, help with meals and some housework  Comments: Had HHPT up until approx 2 weeks ago     Hand Dominance   Dominant Hand: Right    Extremity/Trunk Assessment   Upper Extremity Assessment Upper Extremity Assessment: Overall WFL for tasks assessed    Lower Extremity Assessment Lower Extremity Assessment: Generalized weakness       Communication   Communication: No difficulties  Cognition Arousal/Alertness: Awake/alert Behavior During Therapy: WFL for tasks assessed/performed Overall Cognitive Status: Within Functional Limits for tasks assessed                                        General Comments      Exercises     Assessment/Plan    PT Assessment Patient needs continued PT services  PT Problem List Decreased strength;Decreased activity tolerance;Decreased balance;Decreased mobility;Decreased knowledge of use of DME;Decreased safety awareness       PT Treatment Interventions DME instruction;Gait training;Functional mobility training;Therapeutic activities;Therapeutic exercise;Balance training;Neuromuscular re-education;Patient/family education;Stair training    PT Goals (Current goals can be found in the Care Plan section)  Acute Rehab PT Goals Patient Stated Goal: To walk better PT Goal Formulation: With patient Time For Goal Achievement: 03/13/18 Potential to Achieve Goals: Good    Frequency 7X/week   Barriers to discharge Inaccessible home environment;Decreased caregiver support      Co-evaluation               AM-PAC PT "6 Clicks" Daily Activity  Outcome Measure Difficulty turning over in bed (including adjusting bedclothes, sheets and blankets)?: Unable Difficulty  moving from lying on back to sitting on the side of the bed? : Unable Difficulty sitting down on and standing up from a chair with arms (e.g., wheelchair, bedside commode, etc,.)?: Unable Help needed moving to and from a bed to chair (including a wheelchair)?: A Lot Help needed walking in hospital room?: Total Help needed climbing 3-5 steps with a railing? : Total 6 Click Score: 7    End of Session Equipment Utilized During Treatment: Gait belt;Oxygen Activity Tolerance: Patient limited by fatigue Patient left: in bed;with call bell/phone within reach;with bed alarm set Nurse Communication: Mobility status PT Visit Diagnosis: Unsteadiness on feet (R26.81);Muscle weakness (generalized) (M62.81);History of falling (Z91.81);Difficulty in walking, not elsewhere classified (R26.2)    Time: 5300-5110 PT Time Calculation (min) (ACUTE ONLY): 42 min   Charges:   PT Re-Evaluation $PT Eval Low Complexity: 1 Low PT Treatments $Gait Training: 8-22 mins        D. Elly Modena PT, DPT 02/28/18, 4:25 PM

## 2018-02-28 NOTE — Discharge Summary (Signed)
[employer] [facility] [place] at [facility], 72 y.o., DOB 1946-03-02, MRN [id number]. Admission date: 02/23/2018 Discharge Date 02/28/2018 Primary MD Chad Baseman, MD Admitting Physician Chad Finner, MD  Admission Diagnosis  COPD with exacerbation [facility]) [J44.1] Cystitis [N30.90] Altered mental status, unspecified altered mental status type [R41.82]  Discharge Diagnosis   Active Problems:   Acute metabolic encephalopathy Acute on chronic hypercarbic hypoxic respiratory failure due to COPD exacerbation UTI due to Serratia Fracture dislocation left rear foot at the talonavicular and calcaneocuboid joints Nondisplaced proximal fibular fracture seen by orthopedic Dr. Hyacinth Huffman Chronic respiratory failure requiring oxygen Chronic left foot deformity Hypertension   Hospital Course  DannyPerryis a72 y.o.malewith a known history of chronic respiratory failure on chronic home oxygen, CHF, peripheral neuropathy comes to the emergency room after he was found by neighbors standing naked on his porch and confused. EMS was called the found his sats in the 70s. They also noted patient's oxygen tubing was kinked and stuck in his door. Found to have some deformities in his left foot with no bruising on his left foot. Cording to patient's brother a friend informed that patient's coffee table was knocked out in the house and there is a possibility patient may have fallen.  Patient was seen in the ED noted to have UTI also noticed to have COPD exasperation.  Patient was admitted for these.  He also was complaining of pain in the foot.  He was seen by orthopedic and podiatry.  He was noted to have some fracture dislocation of the left rear foot.  Patient was ordered to have a cam walker boot if were not able to get that he will have a splint posteriorly in the left leg. Is very weak and deconditioned need of rehab.   Continue oxygen of 4 L Omnicef for 4 more days      Consults   orthopedic surgery, podiatry  Significant Tests:  See full reports for all details     Dg Wrist Complete Left  Result Date: 02/23/2018 CLINICAL DATA:  LEFT wrist swelling post fall EXAM: LEFT WRIST - COMPLETE 3+ VIEW COMPARISON:  03/13/2017 FINDINGS: Osseous demineralization. Radiocarpal and midcarpal joint space narrowing. Chronic dorsal dislocation of the lunate. Extensive soft tissue swelling at wrist. No acute fracture, additional dislocation or bone destruction. Spur formation at the volar margin of the radiocarpal joint. IMPRESSION: Chronic dorsal lunate dislocation. Extensive degenerative changes and osseous demineralization. No definite acute fracture identified. Electronically Signed   By: Ulyses Southward M.D.   On: 02/23/2018 12:46   Ct Head Wo Contrast  Result Date: 02/23/2018 CLINICAL DATA:  Altered LOC EXAM: CT HEAD WITHOUT CONTRAST TECHNIQUE: Contiguous axial images were obtained from the base of the skull through the vertex without intravenous contrast. COMPARISON:  10/04/2017 FINDINGS: Brain: Motion degraded study. Artifact over the posterior fossa from hardware. No acute territorial infarction, hemorrhage or intracranial mass. Encephalomalacia in the right frontal lobe. Atrophy. Mild small vessel ischemic changes of the white matter. Probable old lacunar infarct in the left cerebellum. Vascular: No hyperdense vessels. Carotid vascular and vertebral artery calcification Skull: No fracture Sinuses/Orbits: No acute finding. Other: None IMPRESSION: 1. Motion degraded study. No CT evidence for acute intracranial abnormality. 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 Atrophy and mild small vessel ischemic changes of the white matter Electronically Signed   By: Jasmine Pang M.D.   On: 02/23/2018 16:40   Ct Angio Chest Pe W And/or Wo Contrast  Result Date: 02/23/2018 CLINICAL  DATA:  72 year old male with acute shortness of breath, recent fall, hypoxia and abdominal and pelvic discomfort. EXAM: CT  ANGIOGRAPHY CHEST CT ABDOMEN AND PELVIS WITH CONTRAST TECHNIQUE: Multidetector CT imaging of the chest was performed using the standard protocol during bolus administration of intravenous contrast. Multiplanar CT image reconstructions and MIPs were obtained to evaluate the vascular anatomy. Multidetector CT imaging of the abdomen and pelvis was performed using the standard protocol during bolus administration of intravenous contrast. CONTRAST:  75mL OMNIPAQUE IOHEXOL 350 MG/ML SOLN COMPARISON:  10/09/2017 chest CT, 04/16/2015 abdomen/pelvic CT and other studies FINDINGS: CTA CHEST FINDINGS Cardiovascular: This is a technically borderline study due to respiratory motion artifact and borderline contrast opacification of the pulmonary arteries. No central, large or definite pulmonary emboli identified. Prominent main pulmonary artery again noted. Cardiomegaly identified without pericardial effusion. Coronary artery and aortic atherosclerotic calcification noted without aortic aneurysm. Mediastinum/Nodes: No enlarged mediastinal, hilar, or axillary lymph nodes. Thyroid gland, trachea, and esophagus demonstrate no significant findings. Lungs/Pleura: RIGHT LOWER lobe scarring/atelectasis again noted. No new pulmonary opacities are identified. No pleural effusion or pneumothorax. Musculoskeletal: No acute or suspicious bony abnormality identified. Posterior fusion hardware in the cervical/thoracic spine again noted. Review of the MIP images confirms the above findings. CT ABDOMEN and PELVIS FINDINGS Hepatobiliary: No hepatic abnormalities identified. Cholelithiasis again identified without CT evidence of acute cholecystitis. No biliary dilatation. Pancreas: Mildly atrophic without other significant abnormality Spleen: Unremarkable Adrenals/Urinary Tract: The kidneys and adrenal glands are unremarkable. Mild circumferential wall thickening of the bladder again noted. No hydronephrosis or urinary calculi. Stomach/Bowel:  Stomach is within normal limits. No evidence of bowel wall thickening, distention, or inflammatory changes. Colonic diverticulosis noted without evidence of diverticulitis. A moderate to large amount of stool within the distal sigmoid colon and rectum noted. Vascular/Lymphatic: Aorto bi-iliac endograft appears unchanged. Abdominal aorta again measures 3.5 cm in greatest diameter. No enlarged lymph nodes identified. Reproductive: Prostate calcifications again noted. Other: No free fluid, abscess or pneumoperitoneum. Musculoskeletal: No acute or suspicious bony abnormalities identified. Posterior fusion changes at L4-L5 again noted. Review of the MIP images confirms the above findings. IMPRESSION: 1. No evidence of acute abnormality. No pulmonary emboli identified but the study is technically borderline. 2. Cholelithiasis without CT evidence of acute cholecystitis. 3. Aorto bi-iliac endograft again noted without change. 4. Continued RIGHT LOWER lobe atelectasis/scarring. 5. Cardiomegaly and prominent main pulmonary artery/pulmonary arterial hypertension 6. Coronary artery and aortic Atherosclerosis (ICD10-I70.0). Electronically Signed   By: Harmon Pier M.D.   On: 02/23/2018 13:21   Ct Abdomen Pelvis W Contrast  Result Date: 02/23/2018 CLINICAL DATA:  72 year old male with acute shortness of breath, recent fall, hypoxia and abdominal and pelvic discomfort. EXAM: CT ANGIOGRAPHY CHEST CT ABDOMEN AND PELVIS WITH CONTRAST TECHNIQUE: Multidetector CT imaging of the chest was performed using the standard protocol during bolus administration of intravenous contrast. Multiplanar CT image reconstructions and MIPs were obtained to evaluate the vascular anatomy. Multidetector CT imaging of the abdomen and pelvis was performed using the standard protocol during bolus administration of intravenous contrast. CONTRAST:  75mL OMNIPAQUE IOHEXOL 350 MG/ML SOLN COMPARISON:  10/09/2017 chest CT, 04/16/2015 abdomen/pelvic CT and other  studies FINDINGS: CTA CHEST FINDINGS Cardiovascular: This is a technically borderline study due to respiratory motion artifact and borderline contrast opacification of the pulmonary arteries. No central, large or definite pulmonary emboli identified. Prominent main pulmonary artery again noted. Cardiomegaly identified without pericardial effusion. Coronary artery and aortic atherosclerotic calcification noted without aortic aneurysm. Mediastinum/Nodes: No enlarged mediastinal,  hilar, or axillary lymph nodes. Thyroid gland, trachea, and esophagus demonstrate no significant findings. Lungs/Pleura: RIGHT LOWER lobe scarring/atelectasis again noted. No new pulmonary opacities are identified. No pleural effusion or pneumothorax. Musculoskeletal: No acute or suspicious bony abnormality identified. Posterior fusion hardware in the cervical/thoracic spine again noted. Review of the MIP images confirms the above findings. CT ABDOMEN and PELVIS FINDINGS Hepatobiliary: No hepatic abnormalities identified. Cholelithiasis again identified without CT evidence of acute cholecystitis. No biliary dilatation. Pancreas: Mildly atrophic without other significant abnormality Spleen: Unremarkable Adrenals/Urinary Tract: The kidneys and adrenal glands are unremarkable. Mild circumferential wall thickening of the bladder again noted. No hydronephrosis or urinary calculi. Stomach/Bowel: Stomach is within normal limits. No evidence of bowel wall thickening, distention, or inflammatory changes. Colonic diverticulosis noted without evidence of diverticulitis. A moderate to large amount of stool within the distal sigmoid colon and rectum noted. Vascular/Lymphatic: Aorto bi-iliac endograft appears unchanged. Abdominal aorta again measures 3.5 cm in greatest diameter. No enlarged lymph nodes identified. Reproductive: Prostate calcifications again noted. Other: No free fluid, abscess or pneumoperitoneum. Musculoskeletal: No acute or suspicious  bony abnormalities identified. Posterior fusion changes at L4-L5 again noted. Review of the MIP images confirms the above findings. IMPRESSION: 1. No evidence of acute abnormality. No pulmonary emboli identified but the study is technically borderline. 2. Cholelithiasis without CT evidence of acute cholecystitis. 3. Aorto bi-iliac endograft again noted without change. 4. Continued RIGHT LOWER lobe atelectasis/scarring. 5. Cardiomegaly and prominent main pulmonary artery/pulmonary arterial hypertension 6. Coronary artery and aortic Atherosclerosis (ICD10-I70.0). Electronically Signed   By: Harmon Pier M.D.   On: 02/23/2018 13:21   Ct Foot Left Wo Contrast  Result Date: 02/23/2018 CLINICAL DATA:  Foot swelling.  Possible fall today. EXAM: CT OF THE LEFT FOOT WITHOUT CONTRAST TECHNIQUE: Multidetector CT imaging of the left foot was performed according to the standard protocol. Multiplanar CT image reconstructions were also generated. COMPARISON:  Radiographs dated 02/23/2018 and 10/08/2013 and 03/03/2016 FINDINGS: Bones/Joint/Cartilage There is fragmentation and subluxation of the navicular as well as fragmentation and subluxation of the cuboid at the calcaneocuboid joint as well as fragmentation and impaction calcaneus at the calcaneocuboid joint. 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 Patient has had a previous joint replacement at the first MTP joint. Ligaments Suboptimally assessed by CT. No discrete ligamentous disruption at the ankle. Muscles and Tendons The tendons around the ankle are intact. Achilles tendon and plantar fascia are intact. Soft tissues Arterial vascular calcifications in the foot and around the ankle. Diffuse subcutaneous edema of the foot and ankle. IMPRESSION: 1. The fragmentation and subluxations of the cuboid and navicular are consistent with Charcot joints. I do not think that the findings are consistent with osteomyelitis. 2. 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 3. Diffuse subcutaneous edema is nonspecific. Electronically Signed   By: Francene Boyers M.D.   On: 02/23/2018 17:19   Dg Chest Port 1 View  Result Date: 02/27/2018 CLINICAL DATA:  Shortness of breath and right-sided chest pain. EXAM: PORTABLE CHEST 1 VIEW COMPARISON:  Chest CT 02/23/2018 FINDINGS: Mild right basilar atelectasis with elevation of the right hemidiaphragm. Old healed left lateral rib fractures. Moderate cardiomegaly. Atherosclerotic calcification of the aortic arch. Upper thoracic/lower cervical posterolateral rod and facet and pedicle screw fixator. Prominence of the main pulmonary artery favoring pulmonary arterial hypertension. IMPRESSION: 1. Atelectasis at the right lung base with elevated right hemidiaphragm. If clinically warranted,  fluoroscopic sniff test could be utilized to assess for diaphragmatic paralysis/dysfunction. 2. Moderate cardiomegaly, without edema. 3.  Aortic Atherosclerosis (ICD10-I70.0). 4. Prominent main pulmonary artery favoring pulmonary arterial hypertension. Electronically Signed   By: Gaylyn Rong M.D.   On: 02/27/2018 10:57   Dg Chest Port 1 View  Result Date: 02/23/2018 CLINICAL DATA:  Altered mental status EXAM: PORTABLE CHEST 1 VIEW COMPARISON:  10/04/2017 chest radiograph. FINDINGS: Surgical hardware overlying the cervical spine is partially visualized. Stable cardiomediastinal silhouette with mild cardiomegaly. No pneumothorax. No pleural effusion. Borderline mild pulmonary edema. Stable elevation of the right hemidiaphragm with mild right basilar scarring versus atelectasis. IMPRESSION: 1. Borderline mild congestive heart failure. 2. Mild right basilar scarring versus atelectasis. Electronically Signed   By: Delbert Phenix M.D.   On: 02/23/2018 12:19   Dg Knee Complete 4 Views Left  Result Date: 02/25/2018 CLINICAL DATA:  Fall 2 days ago. EXAM: LEFT KNEE - COMPLETE 4+  VIEW COMPARISON:  None. FINDINGS: There is an apparent fracture through the proximal fibula best seen on the lateral view. No other fractures. No joint effusion. Minimal degenerative changes. IMPRESSION: Nondisplaced proximal fibular fracture.  Mild degenerative changes. Electronically Signed   By: Gerome Sam III M.D   On: 02/25/2018 16:33   Dg Foot Complete Left  Result Date: 02/23/2018 CLINICAL DATA:  LEFT foot and LEFT wrist swelling after fall, LEFT foot bruising EXAM: LEFT FOOT - COMPLETE 3+ VIEW COMPARISON:  10/08/2013 FINDINGS: Marked dorsal soft tissue swelling LEFT foot. Osseous demineralization. Prior joint replacement surgery at first MTP joint. Small bone fragments are identified at the expected position of the dorsal margin of the tarsal navicular. Tarsal navicular however is dislocated inferiorly. In addition, the proximal margin of the cuboid is poorly defined and questionably destroyed versus fractured though no definite bone fragment is seen. These changes could reflect traumatic injuries at the midfoot or Charcot changes. No additional fracture or dislocation identified. IMPRESSION: Inferior dislocation of the tarsal navicular at the talonavicular joint with small bone fragments at the cranial margin question avulsion injury at the talonavicular ligament/capsular insertion. Questionable destruction or fracture at the proximal margin of the cuboid laterally. These changes could reflect acute trauma or Charcot changes; further evaluation by CT recommended. Electronically Signed   By: Ulyses Southward M.D.   On: 02/23/2018 12:33       Today   Subjective:   Chad Huffman patient doing better Objective:   Blood pressure (!) 158/74, pulse 74, temperature 97.8 F (36.6 C), temperature source Oral, resp. rate 18, height 5\' 10"  (1.778 m), weight 110.5 kg, SpO2 94 %.  .  Intake/Output Summary (Last 24 hours) at 02/28/2018 1231 Last data filed at 02/28/2018 1035 Gross per 24 hour  Intake 960  ml  Output 850 ml  Net 110 ml    Exam VITAL SIGNS: Blood pressure (!) 158/74, pulse 74, temperature 97.8 F (36.6 C), temperature source Oral, resp. rate 18, height 5\' 10"  (1.778 m), weight 110.5 kg, SpO2 94 %.  GENERAL:  72 y.o.-year-old patient lying in the bed with no acute distress.  EYES: Pupils equal, round, reactive to light and accommodation. No scleral icterus. Extraocular muscles intact.  HEENT: Head atraumatic, normocephalic. Oropharynx and nasopharynx clear.  NECK:  Supple, no jugular venous distention. No thyroid enlargement, no tenderness.  LUNGS: Normal breath sounds bilaterally, no wheezing, rales,rhonchi or crepitation. No use of accessory muscles of respiration.  CARDIOVASCULAR: S1, S2 normal. No murmurs, rubs, or gallops.  ABDOMEN: Soft, nontender, nondistended.  Bowel sounds present. No organomegaly or mass.  EXTREMITIES: Left foot swelling  nEUROLOGIC: Cranial nerves II through XII are intact. Muscle strength 5/5 in all extremities. Sensation intact. Gait not checked.  PSYCHIATRIC: The patient is alert and oriented x 3.  SKIN: No obvious rash, lesion, or ulcer.   Data Review     CBC w Diff:  Lab Results  Component Value Date   WBC 8.9 02/24/2018   HGB 10.5 (L) 02/24/2018   HGB 12.0 (L) 05/28/2014   HCT 31.1 (L) 02/24/2018   HCT 37.3 (L) 05/28/2014   PLT 121 (L) 02/24/2018   PLT 141 (L) 05/28/2014   LYMPHOPCT 3 02/24/2018   MONOPCT 7 02/24/2018   EOSPCT 0 02/24/2018   BASOPCT 0 02/24/2018   CMP:  Lab Results  Component Value Date   NA 141 02/24/2018   NA 139 06/10/2016   NA 130 (L) 05/28/2014   K 4.7 02/24/2018   K 3.7 05/28/2014   CL 99 02/24/2018   CL 90 (L) 05/28/2014   CO2 34 (H) 02/24/2018   CO2 34 (H) 05/28/2014   BUN 18 02/24/2018   BUN 14 06/10/2016   BUN 8 05/28/2014   CREATININE 0.85 02/24/2018   CREATININE 1.00 05/28/2014   GLU 87 06/10/2016   PROT 7.1 02/23/2018   PROT 7.1 05/28/2014   ALBUMIN 4.5 02/23/2018   ALBUMIN 3.4  05/28/2014   BILITOT 2.3 (H) 02/23/2018   BILITOT 1.1 (H) 05/28/2014   ALKPHOS 64 02/23/2018   ALKPHOS 85 05/28/2014   AST 23 02/23/2018   AST 25 05/28/2014   ALT 15 02/23/2018   ALT 20 05/28/2014  .  Micro Results Recent Results (from the past 240 hour(s))  Blood Culture (routine x 2)     Status: None   Collection Time: 02/23/18 10:53 AM  Result Value Ref Range Status   Specimen Description BLOOD RIGHT [facility]  Final   Special Requests   Final    BOTTLES DRAWN AEROBIC AND ANAEROBIC Blood Culture adequate volume   Culture   Final    NO GROWTH 5 DAYS Performed at [facility], [address], [place], [place] [address]    Report Status 02/28/2018 FINAL  Final  Blood Culture (routine x 2)     Status: None   Collection Time: 02/23/18 10:53 AM  Result Value Ref Range Status   Specimen Description BLOOD LEFT FA  Final   Special Requests   Final    BOTTLES DRAWN AEROBIC AND ANAEROBIC Blood Culture adequate volume   Culture   Final    NO GROWTH 5 DAYS Performed at [facility], [address] [place], [place] [address]    Report Status 02/28/2018 FINAL  Final  Urine culture     Status: Abnormal   Collection Time: 02/23/18 11:05 AM  Result Value Ref Range Status   Specimen Description   Final    URINE, RANDOM Performed at [facility], [address] [place], [place] [address]    Special Requests   Final    NONE Performed at [facility], [address], [place], [place] [address]    Culture >=100,000 COLONIES/mL SERRATIA MARCESCENS (A)  Final   Report Status 02/25/2018 FINAL  Final   Organism ID, Bacteria SERRATIA MARCESCENS (A)  Final      Susceptibility   Serratia marcescens - MIC*    CEFAZOLIN >=64 RESISTANT Resistant     CEFTRIAXONE <=1 SENSITIVE Sensitive     CIPROFLOXACIN 0.5 SENSITIVE  Sensitive     GENTAMICIN <=1 SENSITIVE Sensitive     NITROFURANTOIN >=512 RESISTANT Resistant     TRIMETH/SULFA 80 RESISTANT Resistant     *  >=100,000 COLONIES/mL SERRATIA MARCESCENS  MRSA PCR Screening     Status: None   Collection Time: 02/24/18 12:48 AM  Result Value Ref Range Status   MRSA by PCR NEGATIVE NEGATIVE Final    Comment:        The GeneXpert MRSA Assay (FDA approved for NASAL specimens only), is one component of a comprehensive MRSA colonization surveillance program. It is not intended to diagnose MRSA infection nor to guide or monitor treatment for MRSA infections. Performed at [facility], [address] [place], [place] [address]         Code Status Orders  (From admission, onward)         Start     Ordered   02/23/18 1936  Do not attempt resuscitation (DNR)  Continuous    Question Answer Comment  In the event of cardiac or respiratory ARREST Do not call a "code blue"   In the event of cardiac or respiratory ARREST Do not perform Intubation, CPR, defibrillation or ACLS   In the event of cardiac or respiratory ARREST Use medication by any route, position, wound care, and other measures to relive pain and suffering. May use oxygen, suction and manual treatment of airway obstruction as needed for comfort.      02/23/18 1935        Code Status History    Date Active Date Inactive Code Status Order ID Comments User Context   10/04/2017 2025 10/10/2017 1616 DNR [id number]  Ihor Austin, MD Inpatient   10/04/2017 1758 10/04/2017 2025 DNR [id number]  Ihor Austin, MD ED   09/13/2017 0657 09/13/2017 1547 DNR [id number]  Milagros Loll, MD Inpatient   09/09/2017 1333 09/13/2017 0657 Full Code [id number]  Ramonita Lab, MD ED   06/24/2017 2105 06/27/2017 1732 Full Code [id number]  Houston Siren, MD Inpatient   09/26/2016 0852 09/28/2016 2146 Full Code [id number]  Shaune Pollack, MD Inpatient   09/13/2016 2229 09/16/2016 1551 Full Code [id number]  Oralia Manis, MD ED   09/09/2016 0911 09/10/2016 1632 Full Code [id number]  Nada Libman, MD Inpatient   08/20/2016 0206 08/23/2016 2138 Full Code [id number]   Hugelmeyer, Alexis, DO Inpatient   07/16/2016 1312 07/27/2016 2018 Full Code [id number]  Erin Fulling, MD ED   03/09/2016 1720 03/15/2016 2206 Full Code [id number]  Heide Spark Inpatient   03/03/2016 1809 03/09/2016 1604 Full Code [id number]  Erin Fulling, MD ED   02/27/2016 2241 02/29/2016 1953 Full Code [id number]  Oralia Manis, MD Inpatient   07/22/2015 1930 07/26/2015 0016 Full Code [id number]  Adrian Saran, MD Inpatient   02/11/2015 1028 02/11/2015 1815 Full Code [id number]  Nada Libman, MD Inpatient   08/04/2014 2013 08/13/2014 1453 Full Code [id number]  Lynden Oxford, MD Inpatient    Advance Directive Documentation     Most Recent Value  Type of Advance Directive  Healthcare Power of Attorney, Living will  Pre-existing out of facility DNR order (yellow form or pink MOST form)  -  "MOST" Form in Place?  -           Contact information for follow-up providers    Hyatt, Max T, DPM Follow up in 1 week(s).   Specialties:  Podiatry, Radiology Why:  hosp f/u foot fx Contact information: [employer] [address] [place] [address] [phone]  Chad Baseman, MD Follow up in 6 day(s).   Specialty:  Family Medicine Why:  hosp f/u Contact information: [address] [place] [address] [phone]            Contact information for after-discharge care    Destination    [facility] Preferred SNF .   Service:  Skilled Nursing Contact information: [address] [place] [address] [phone]                  Discharge Medications   Allergies as of 02/28/2018      Reactions   Ampicillin Swelling, Other (See Comments)   Unable to obtain enough information to answer additional questions about this medication.  Has patient had a PCN reaction causing immediate rash, facial/tongue/throat swelling, SOB or lightheadedness with hypotension: Yes Has patient had a PCN reaction causing severe rash involving mucus membranes or skin  necrosis: No Has patient had a PCN reaction that required hospitalization No Has patient had a PCN reaction occurring within the last 10 years: Yes If all of the above answers are "NO", then may pr   Nortriptyline Other (See Comments)   Sleep walking   Brimonidine Other (See Comments)   Unknown reaction per [facility]    Cyclobenzaprine Other (See Comments)   Hallucinations   Tizanidine Other (See Comments)   Hallucinations   Gabapentin Rash      Medication List    STOP taking these medications   furosemide 20 MG tablet Commonly known as:  LASIX     TAKE these medications   acetaminophen 325 MG tablet Commonly known as:  TYLENOL Take 2 tablets (650 mg total) by mouth every 6 (six) hours as needed for moderate pain (headache).   amLODipine 5 MG tablet Commonly known as:  NORVASC Take 5 mg by mouth daily.   aspirin EC 81 MG tablet Take 81 mg by mouth daily.   baclofen 10 MG tablet Commonly known as:  LIORESAL Take 10 mg by mouth 2 (two) times daily as needed for muscle spasms.   cefdinir 300 MG capsule Commonly known as:  OMNICEF Take 1 capsule (300 mg total) by mouth every 12 (twelve) hours for 4 days.   clopidogrel 75 MG tablet Commonly known as:  PLAVIX TAKE 1 TABLET BY MOUTH EVERY DAY   clotrimazole-betamethasone cream Commonly known as:  LOTRISONE Apply 1 application topically 2 (two) times daily.   desonide 0.05 % cream Commonly known as:  DESOWEN APPLY TO AFFECTED AREA TWICE A DAY   guaiFENesin 600 MG 12 hr tablet Commonly known as:  MUCINEX Take 1 tablet (600 mg total) by mouth 2 (two) times daily.   ipratropium-albuterol 0.5-2.5 (3) MG/3ML Soln Commonly known as:  DUONEB Take 3 mLs by nebulization every 6 (six) hours as needed. Dx:J44.9   [employer] Omega-3 500 MG Caps Take 1 tablet by mouth daily.   levothyroxine 200 MCG tablet Commonly known as:  SYNTHROID, LEVOTHROID Take 200 mcg by mouth daily before breakfast.   metoprolol tartrate 25 MG  tablet Commonly known as:  LOPRESSOR Take 1 tablet (25 mg total) by mouth 2 (two) times daily.   multivitamin with minerals Tabs tablet Take 1 tablet by mouth daily.   oxyCODONE 5 MG immediate release tablet Commonly known as:  Oxy IR/ROXICODONE Take 1 tablet (5 mg total) by mouth every 6 (six) hours as needed for moderate pain.   OXYGEN 4 L/min by Intratracheal route as needed (for shortness of  breath).   predniSONE 10 MG (21) Tbpk tablet Commonly known as:  STERAPRED UNI-PAK 21 TAB Start at 60mg  taper 10mg  until complete   SYMBICORT 160-4.5 MCG/ACT inhaler Generic drug:  budesonide-formoterol TAKE 2 PUFFS BY MOUTH TWICE A DAY   tiotropium 18 MCG inhalation capsule Commonly known as:  SPIRIVA Place 1 capsule (18 mcg total) into inhaler and inhale daily.          Total Time in preparing paper work, data evaluation and todays exam - 35 minutes  Auburn Bilberry M.D on 02/28/2018 at 12:31 PM [employer]   Office  [phone]

## 2018-02-28 NOTE — Progress Notes (Signed)
Pt prepared for d/c to SNF. IV d/c'd. Skin intact except as charted in most recent assessments. Vitals are stable. Report called to receiving facility. Pt to be transported by ambulance service. Money from safe was brought back to pt from security.   Chad Huffman [employer]

## 2018-03-06 ENCOUNTER — Ambulatory Visit: Payer: Medicare Other | Admitting: Podiatry

## 2018-03-08 ENCOUNTER — Ambulatory Visit: Payer: Medicare Other | Admitting: Podiatry

## 2018-03-09 ENCOUNTER — Encounter: Payer: Self-pay | Admitting: Podiatry

## 2018-03-09 ENCOUNTER — Encounter

## 2018-03-09 ENCOUNTER — Ambulatory Visit (INDEPENDENT_AMBULATORY_CARE_PROVIDER_SITE_OTHER): Payer: Medicare Other | Admitting: Podiatry

## 2018-03-09 DIAGNOSIS — I6523 Occlusion and stenosis of bilateral carotid arteries: Secondary | ICD-10-CM | POA: Diagnosis not present

## 2018-03-09 DIAGNOSIS — S92255A Nondisplaced fracture of navicular [scaphoid] of left foot, initial encounter for closed fracture: Secondary | ICD-10-CM | POA: Diagnosis not present

## 2018-03-09 NOTE — Progress Notes (Signed)
He presents today in a wheelchair from an extended care facility states that he fell 2 weeks ago and hurt his left foot.  He went to the ER and x-ray to stating that he fractured a bone in his foot and they sent him to extended care facility for rehab.  Objective: He presents today in a wheelchair with oxygen.  Left foot is swollen and ecchymotic with mild erythema.  An obvious midtarsal break.  Radiographic evaluation does demonstrate complete fracture of the navicular bone with plantar depression.  Assessment: Fracture navicular" appears to be calcaneal process anteriorly.  Plan: I recommended he remain nonweightbearing in his cam walker until he is seen by Dr. Logan BoresEvans next Friday.  I am not sure that he is a good surgical candidate however and expects other may work as well as an open surgery in this case.  I will let Dr. Logan BoresEvans decide whether or not surgery is something our practice should consider for this patient.

## 2018-03-17 ENCOUNTER — Ambulatory Visit (INDEPENDENT_AMBULATORY_CARE_PROVIDER_SITE_OTHER): Payer: Medicare Other | Admitting: Podiatry

## 2018-03-17 ENCOUNTER — Encounter: Payer: Self-pay | Admitting: Podiatry

## 2018-03-17 DIAGNOSIS — S92255A Nondisplaced fracture of navicular [scaphoid] of left foot, initial encounter for closed fracture: Secondary | ICD-10-CM | POA: Diagnosis not present

## 2018-03-17 IMAGING — DX DG CHEST 1V PORT
1 series · 1 of 1 positions shown · non-contrast
Comparison: 07/16/2016

CLINICAL DATA: Status post intubation

EXAM:
PORTABLE CHEST 1 VIEW

[chest ap]
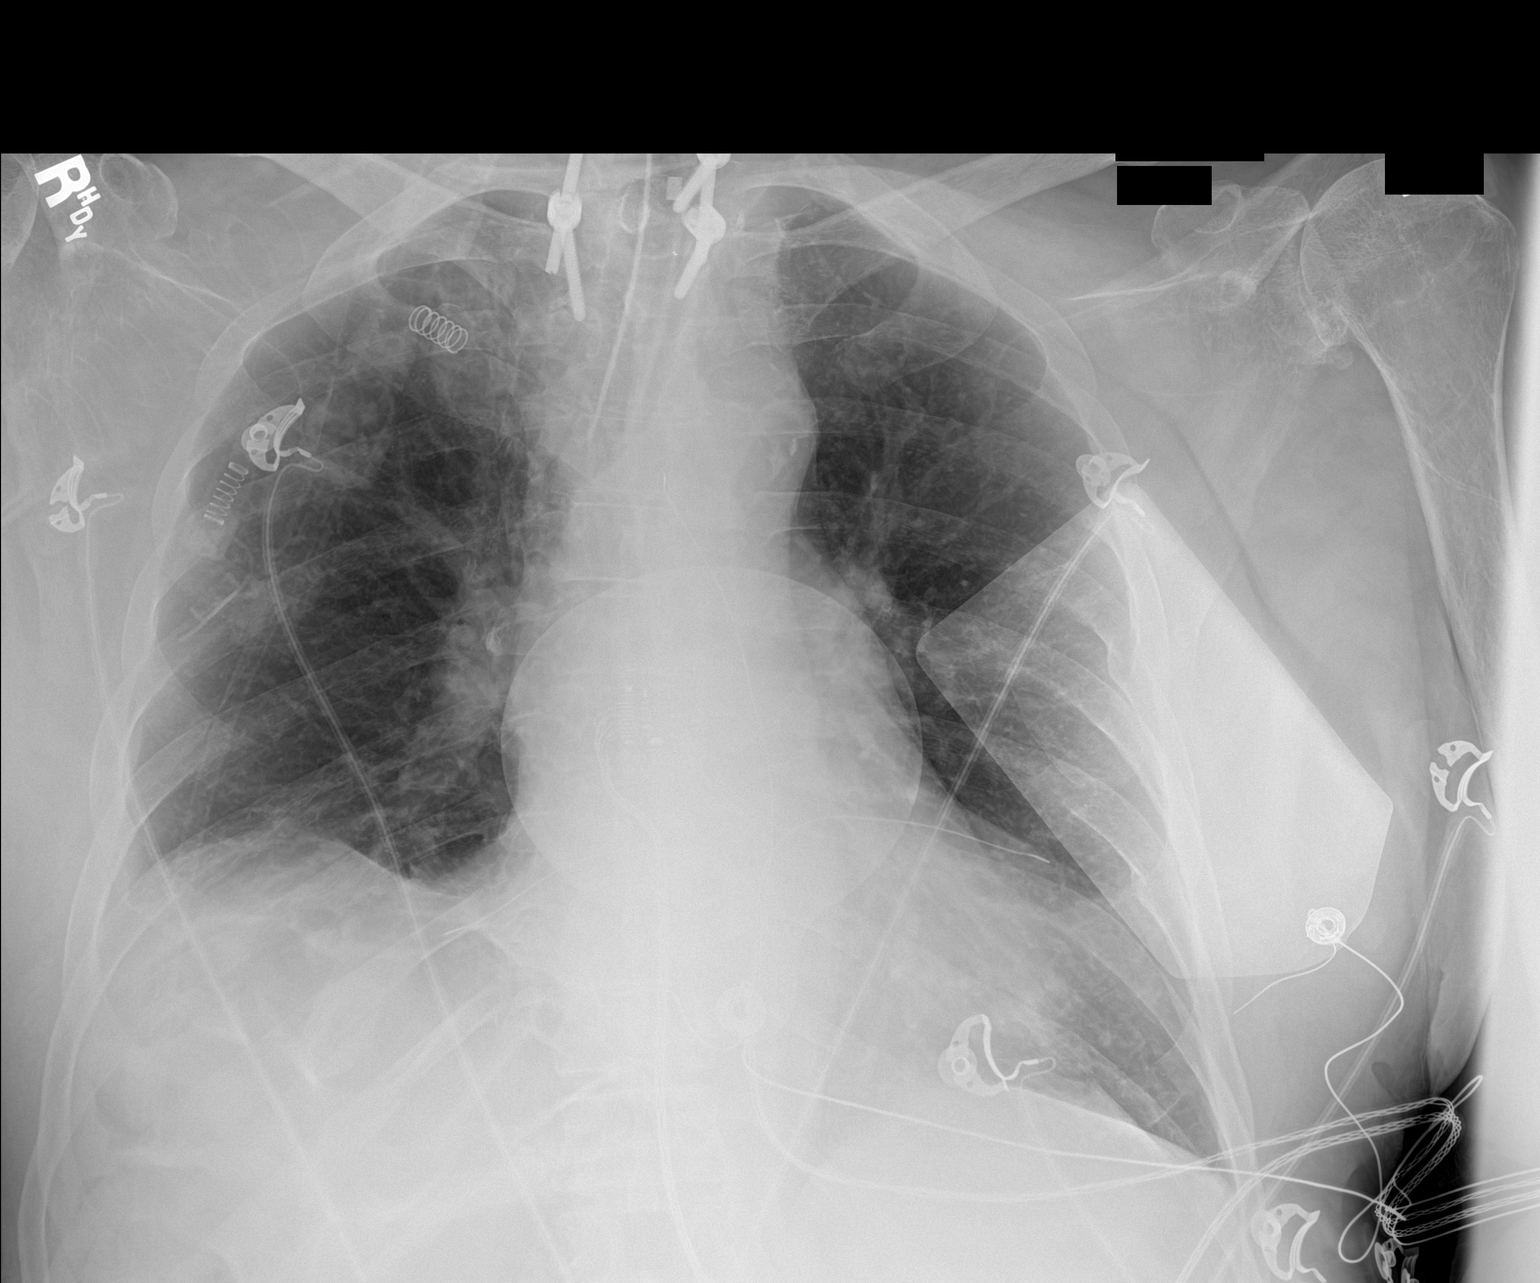

[1 of 1 positions shown; findings below may reference images not displayed]

FINDINGS: Endotracheal tube is now seen 2.5 cm above the carina. The cardiac
shadow is within normal limits. Postsurgical changes in the
cervicothoracic spine are seen. No focal infiltrate or sizable
effusion is noted.
IMPRESSION: Status post intubation.  No acute abnormality noted.

## 2018-03-17 IMAGING — DX DG ABDOMEN 1V
1 series · 1 of 1 positions shown · non-contrast
Comparison: 05/26/2016

CLINICAL DATA: NG tube placement.

EXAM:
ABDOMEN - 1 VIEW

[abdomen kub]
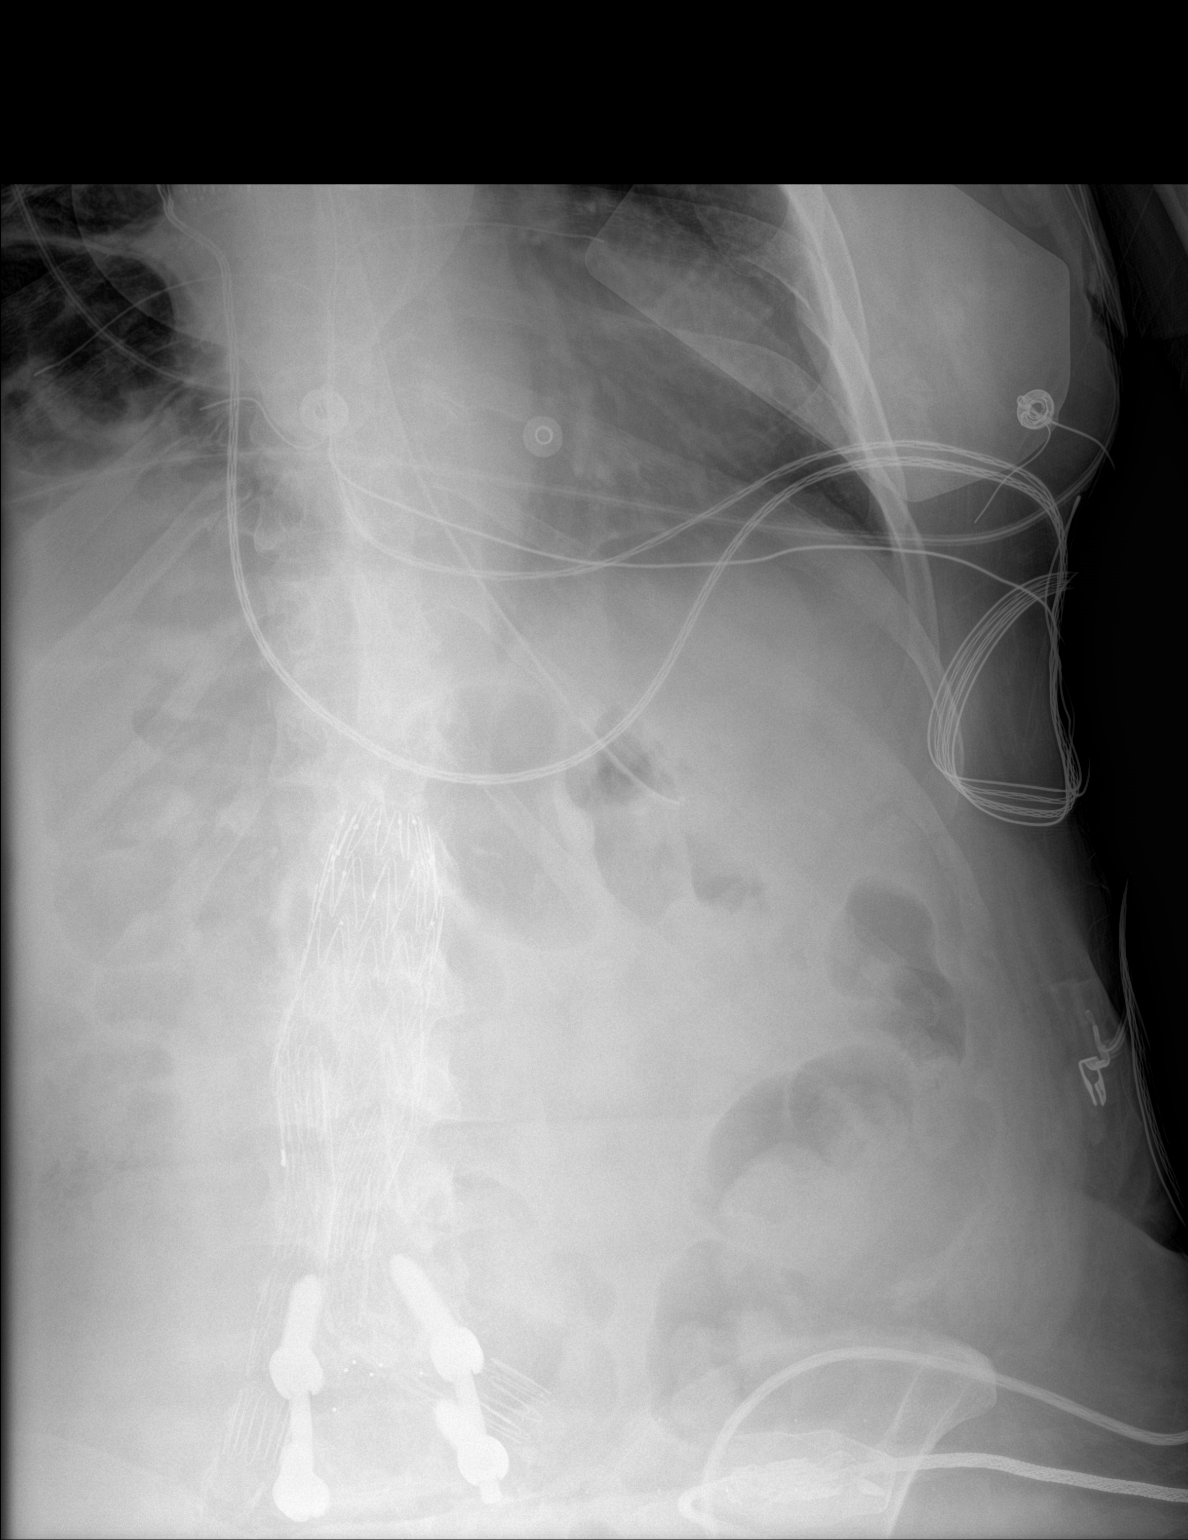

[1 of 1 positions shown; findings below may reference images not displayed]

FINDINGS: Examination demonstrates a nasogastric tube with tip and side-port
over the stomach in the left upper quadrant. Bowel gas pattern is
nonobstructive. A right iliac stent graft is present. Fusion
hardware is present over the lower lumbar spine. There are
degenerative changes of the spine.
IMPRESSION: Nonobstructive bowel gas pattern.

Nasogastric tube with tip and side-port over the stomach in the left
upper quadrant.

## 2018-03-17 IMAGING — CR DG CHEST 2V
1 series · 2 of 2 positions shown · non-contrast
Comparison: 04/16/2016

CLINICAL DATA: Pain

EXAM:
CHEST  2 VIEW

[Series 2: x chest ap · 0.14mm/px · 2 of 2 slices shown]
[im 1/2]
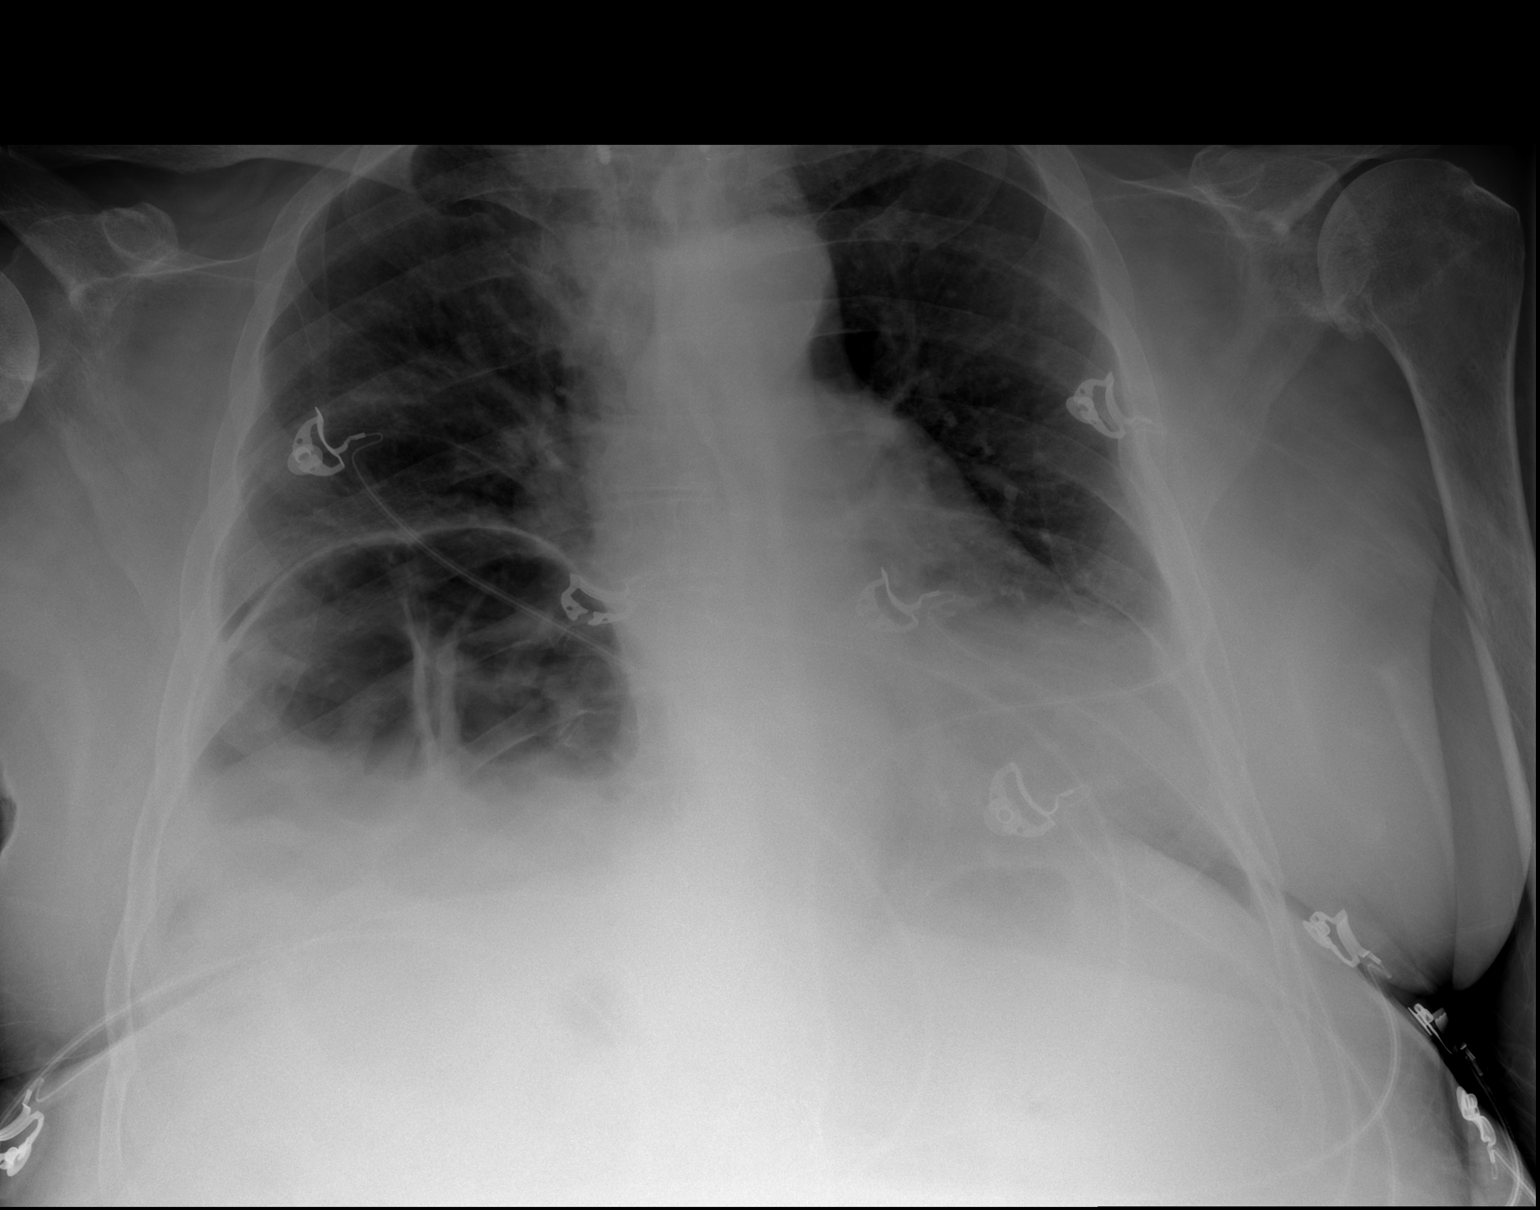
[im 2/2]
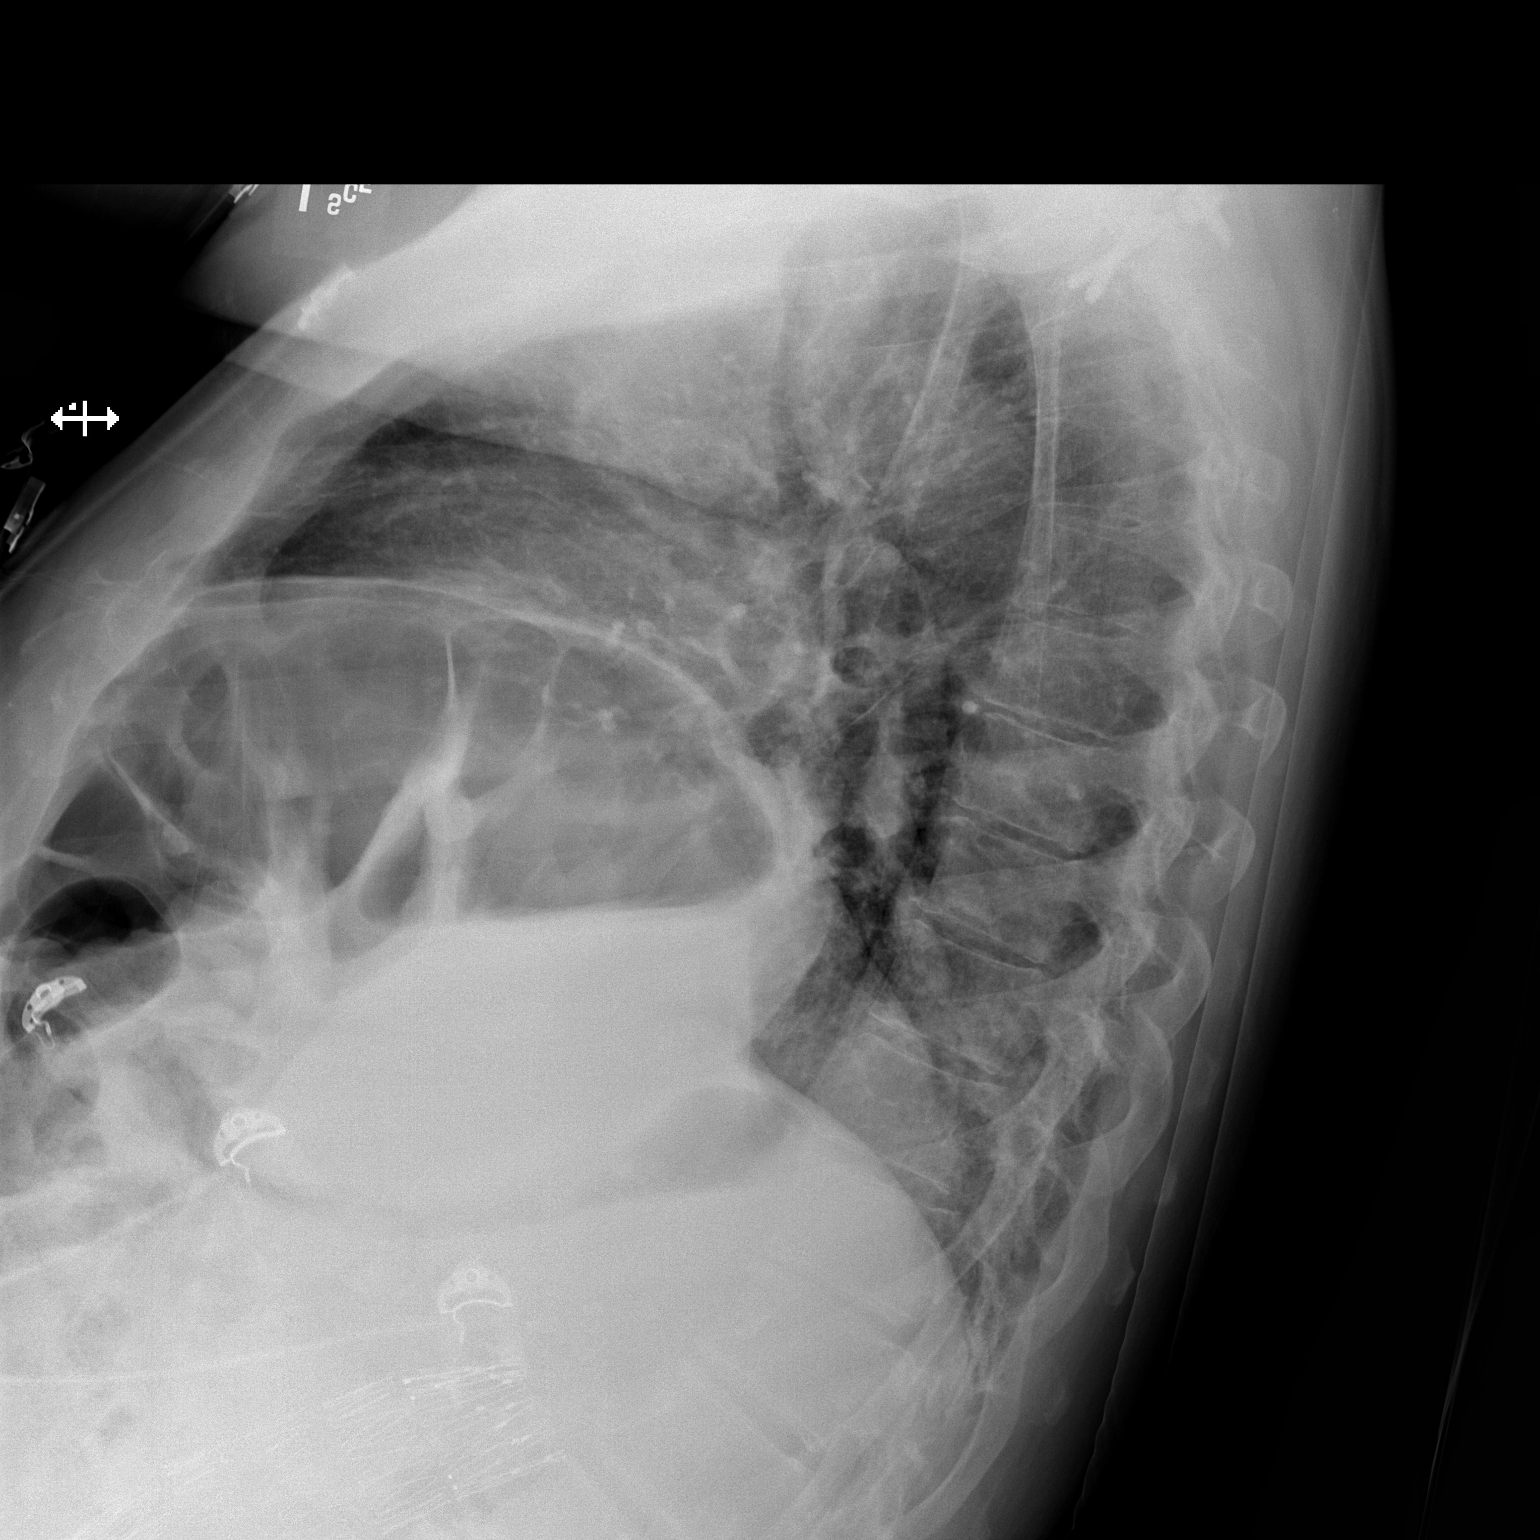

[2 of 2 positions shown; findings below may reference images not displayed]

FINDINGS: Elevation of the right hemidiaphragm with gas distended colon
between the right diaphragm and liver. Cardiomegaly with vascular
congestion. Bibasilar atelectasis. No overt edema or effusions. No
acute bony abnormality.
IMPRESSION: Elevated right hemidiaphragm.  Bibasilar atelectasis.

Cardiomegaly, vascular congestion.

## 2018-03-18 IMAGING — DX DG CHEST 1V PORT
1 series · 1 of 1 positions shown · non-contrast
Comparison: 07/16/2016

CLINICAL DATA: Interval changes

EXAM:
PORTABLE CHEST 1 VIEW

[chest ap]
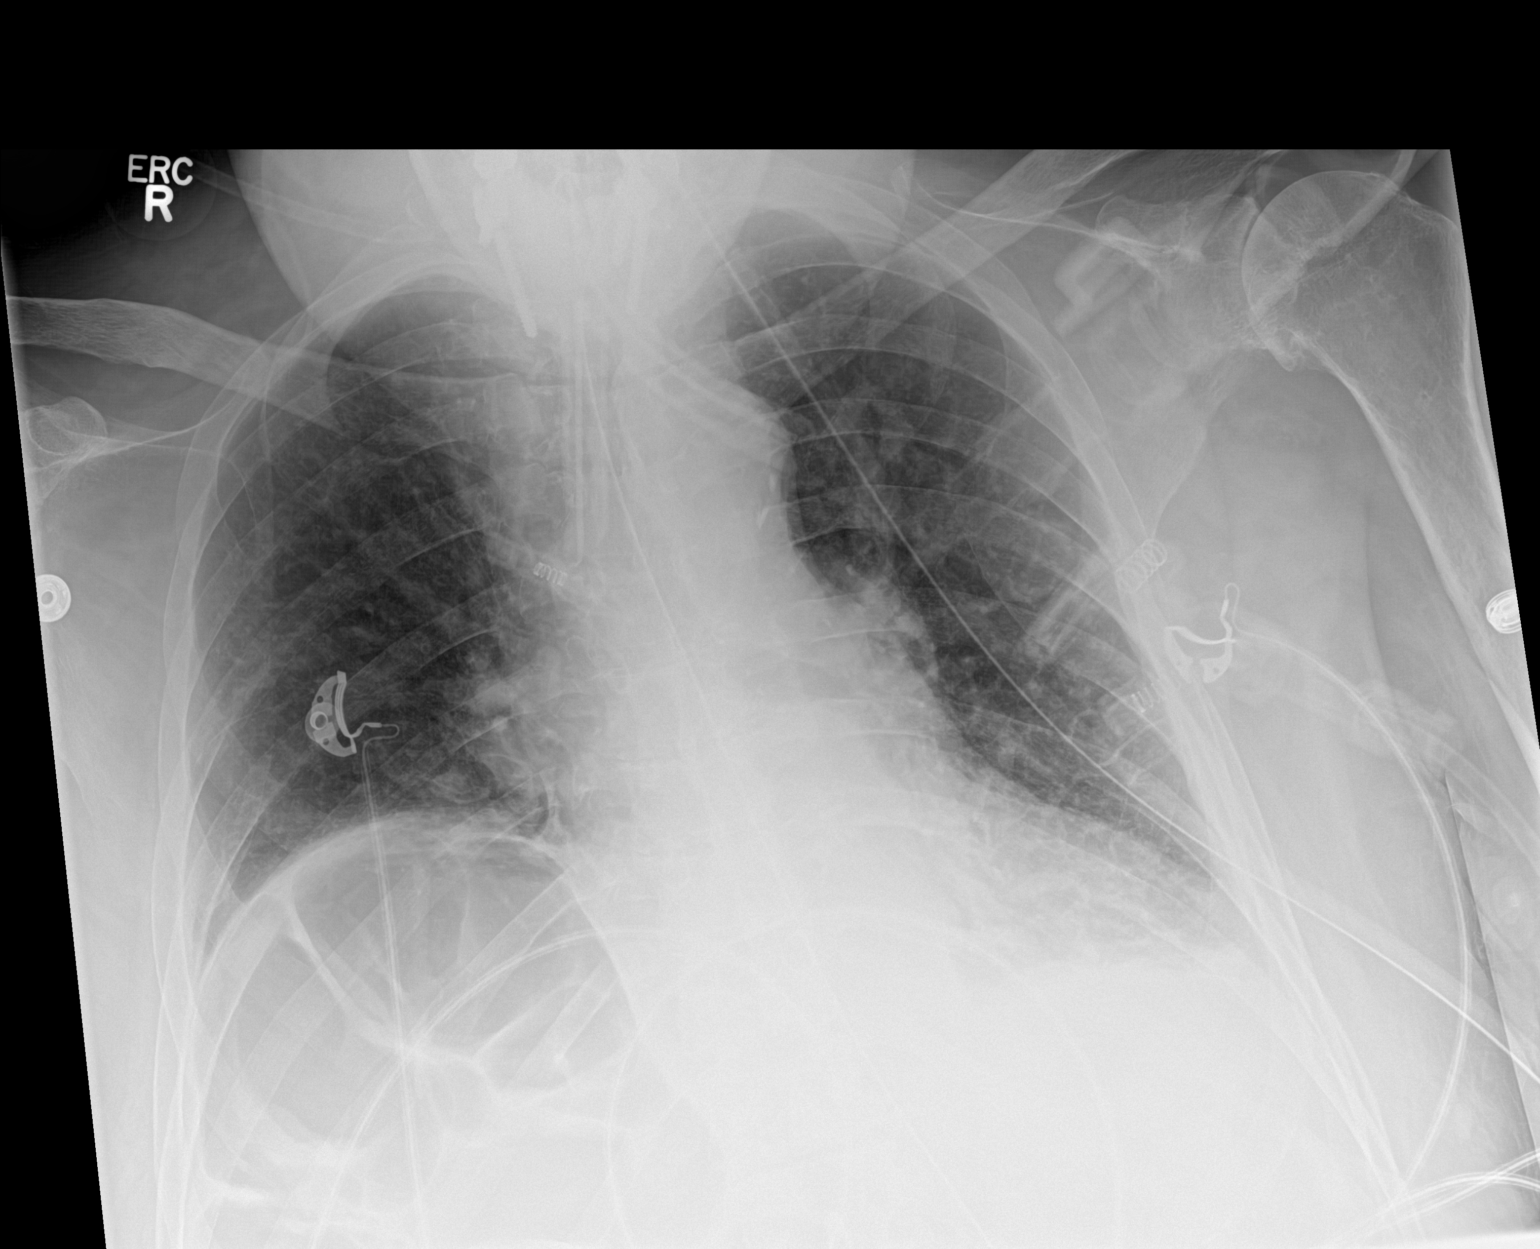

[1 of 1 positions shown; findings below may reference images not displayed]

FINDINGS: Endotracheal tube in good position. Interval placement of NG tube in
the stomach.

Elevated right hemidiaphragm. Increase in mild bibasilar
atelectasis. Negative for edema or effusion
IMPRESSION: Increased mild bibasilar atelectasis.

## 2018-03-19 NOTE — Progress Notes (Signed)
HPI: 72 year old male presents as referral from Dr. Al Corpus regarding an injury to his left foot.  Patient states that approximately 3-4 weeks ago he sustained a fall injury and injured his left foot.  He went to the emergency department where x-rays and CT was taken.  He was sent to an extended care facility for rehab.  Patient is currently at [facility] [place].  He is currently nonweightbearing to his left lower extremity and wheelchair-bound with an immobilization cam boot applied to left lower extremity. Patient does have a history of COPD and is a smoker 30 years x 1 pack/day.  He currently uses oxygen which he says is necessary while sleeping.  Prior to the injury the patient was walking with the assistance of a cane.  Patient is not diabetic.  Patient also lives alone before being admitted to the rehab facility in [place].  He presents for possible surgical intervention and a second opinion regarding the multiple fractures that are diagnosed to his left foot.  Past Medical History:  Diagnosis Date  . Antalgic gait   . Carotid artery occlusion   . CHF (congestive heart failure) (HCC)   . Chronic neck and back pain   . Complication of anesthesia    pt has had cervical fusion-limited neck flextion  . COPD (chronic obstructive pulmonary disease) (HCC)   . Depression   . DJD (degenerative joint disease)    Right shoulder  . Hypertension   . Lumbago   . Peripheral neuropathy      Physical Exam: General: The patient is alert and oriented x3 in no acute distress.  Dermatology: Skin is warm, dry and supple bilateral lower extremities. Negative for open lesions or macerations.  Vascular: Palpable pedal pulses bilaterally.  Capillary refill immediate.  Moderate amount of edema with ecchymosis noted throughout the foot of the left lower extremity.  Neurological: Epicritic and protective threshold grossly intact bilaterally.   Musculoskeletal Exam: Currently  nonambulatory and wheelchair-bound due to mechanical fall injury to the left lower extremity  CT Impression taken 02/23/2018:  1. The fragmentation and subluxations of the cuboid and navicular are consistent with Charcot joints. I do not think that the findings are consistent with osteomyelitis. 2. Some of the fragmentation/fractures could be acute or subacute but the appearance is most consistent with chronic Charcot joints. 3. Diffuse subcutaneous edema is nonspecific.  4.  I personally evaluated the CT imaging and in addition to the noted subluxations/disarticulations, there is an oblique fracture of the fibular malleolus stated as old nonunion fracture.  This would be suspicious for possible acute fracture given the patient's history of the recent slip and fall injury. Require follow-up x-rays of the ankle left to evaluate the ankle fracture.  Assessment: 1.  Subluxation/disarticulation navicular-cuneiform joint left 2.  Subluxation/disarticulation calcaneocuboid joint left 3.  Fibular malleolar ankle fracture left  Plan of Care:  1. Patient evaluated.  All available imaging reviewed today.  2.  Today had a very detailed discussion with the patient.  In my opinion given the patient's multiple fractures/disarticulations of the foot, I highly suspect that if surgical intervention was not warranted he would likely never be able to ambulate on the foot given the current condition.  Although there is obvious risk to surgery, it would be in his best interest to undergo ORIF to allow for appropriate healing and provide the best opportunity for him to ambulate in the future.  Prior to the injury the patient was walking.  We discussed in detail the benefits and disadvantages of surgery. Today we discussed the conservative versus surgical management of the presenting pathology. The patient opts for surgical management. All possible complications and details of the procedure were explained. All patient  questions were answered. No guarantees were expressed or implied. 3. Authorization for surgery was initiated today. Surgery will consist of 1) ORIF navicular cuneiform subluxation with percutaneous pin fixation 2) ORIF calcaneocuboid subluxation with percutaneous pin fixation 3) ORIF fibular malleolar ankle fracture with internal plate and screw fixation.  4.  Patient will need medical clearance prior to surgery.  Patient also lives alone.  He will require continued care at [facility] postsurgically 5.  Return to clinic 1 week postop      Felecia Shelling, DPM [facility]  Dr. Felecia Shelling, DPM    [address] [place], [place] [address]                Office [phone]  Fax [phone]

## 2018-03-20 ENCOUNTER — Ambulatory Visit: Payer: Managed Care, Other (non HMO) | Admitting: Podiatry

## 2018-03-20 NOTE — Progress Notes (Signed)
Patient ID: Chad Huffman, male    DOB: 04-02-1946, 72 y.o.   MRN: [id number]  HPI  Chad Huffman is a 72 y/o male with a history of HTN, DJD, COPD with oxygen, cervical fusion, previous tobacco use and chronic heart failure.   Last echo was done 07/17/16 and showed an EF of 60% along with trivial AR and mild Chad. Normal pulmonary artery pressures. This is comparable to an echo done 08/07/14.  Has been admitted twice and been in the ED once since he was last here ~ 6 months ago.   He presents today for a follow-up visit with a chief complaint of minimal shortness of breath upon moderate exertion. He describes this as chronic in nature having been present for several years. He has associated pedal edema, easy bruising and difficulty sleeping along with this. He denies any abdominal distention, palpitations, chest pain, cough or dizziness. He is currently wheel chair bound due to multiple fractures in his left foot.   Past Medical History:  Diagnosis Date  . Antalgic gait   . Carotid artery occlusion   . CHF (congestive heart failure) (HCC)   . Chronic neck and back pain   . Complication of anesthesia    pt has had cervical fusion-limited neck flextion  . COPD (chronic obstructive pulmonary disease) (HCC)   . Depression   . DJD (degenerative joint disease)    Right shoulder  . Hypertension   . Lumbago   . Peripheral neuropathy    Past Surgical History:  Procedure Laterality Date  . ABDOMINAL AORTIC ANEURYSM REPAIR  2009   Endovascular AAA repair  . ABDOMINAL AORTIC ANEURYSM REPAIR    . CAROTID ANGIOGRAPHY  09/09/2016   Procedure: Carotid Angiography;  Surgeon: Nada Libman, MD;  Location: [facility] INVASIVE CV LAB;  Service: Cardiovascular;;  rt. carotid  . CARPAL TUNNEL RELEASE     bilateral CTS  . ESOPHAGOGASTRODUODENOSCOPY (EGD) WITH PROPOFOL N/A 09/28/2016   Procedure: ESOPHAGOGASTRODUODENOSCOPY (EGD) WITH PROPOFOL;  Surgeon: Midge Minium, MD;  Location: [facility] ENDOSCOPY;  Service: Endoscopy;   Laterality: N/A;  . HAMMER TOE SURGERY  12/10/2011   Procedure: HAMMER TOE CORRECTION;  Surgeon: Ernestene Kiel, DPM;  Location: [facility];  Service: Podiatry;  Laterality: Right;  Right hammertoe repair second right with 2.5x42 orthopro screw   . IR GENERIC HISTORICAL  04/05/2016   IR GASTROSTOMY TUBE MOD SED 04/05/2016 [facility]  . IR GENERIC HISTORICAL  05/12/2016   IR CM INJ ANY COLONIC TUBE W/FLUORO 05/12/2016 Malachy Moan, MD [facility]  . IR GENERIC HISTORICAL  05/26/2016   IR CM INJ ANY COLONIC TUBE W/FLUORO 05/26/2016 [facility]  . IR GENERIC HISTORICAL  05/28/2016   IR REPLC GASTRO/COLONIC TUBE PERCUT W/FLUORO 05/28/2016 Brayton El, PA-C [facility]INTER[facility]  . IR GENERIC HISTORICAL  07/02/2016   IR GASTROSTOMY TUBE REMOVAL 07/02/2016 Brayton El, PA-C [facility]  . METATARSAL OSTEOTOMY  12/10/2011   Procedure: METATARSAL OSTEOTOMY;  Surgeon: Ernestene Kiel, DPM;  Location: [facility];  Service: Podiatry;  Laterality: Right;  Right Keller arthroplasty with size 5 silicone implant right great toe; second metatarsal osteotomy with 2.0x14 screw  . NECK SURGERY    . PERIPHERAL VASCULAR CATHETERIZATION N/A 02/11/2015   Procedure: Carotid Angiography;  Surgeon: Nada Libman, MD;  Location: [facility] INVASIVE CV LAB;  Service: Cardiovascular;  Laterality: N/A;  . PILONIDAL CYST / SINUS EXCISION    . Rotary cuff    . SPINE SURGERY  09/15/11  Scar tissue removed- back  . SPINE SURGERY  09/15/11   Scar tissue removed- back  . TONSILLECTOMY    . TRACHEOSTOMY TUBE PLACEMENT N/A 03/03/2016   Procedure: TRACHEOSTOMY;  Surgeon: Geanie Logan, MD;  Location: [facility] ORS;  Service: ENT;  Laterality: N/A;   Family History  Problem Relation Age of Onset  . Stomach cancer Mother   . Hypertension Brother   . Heart attack Father    Social History   Tobacco Use  . Smoking status: Former Smoker    Packs/day: 1.00    Years: 30.00    Pack years: 30.00    Types: Cigarettes     Last attempt to quit: 08/30/2006    Years since quitting: 11.5  . Smokeless tobacco: Never Used  Substance Use Topics  . Alcohol use: Yes    Alcohol/week: 2.0 standard drinks    Types: 2 Cans of beer per week    Comment: occasional use   Allergies  Allergen Reactions  . Ampicillin Swelling and Other (See Comments)    Unable to obtain enough information to answer additional questions about this medication.  Has patient had a PCN reaction causing immediate rash, facial/tongue/throat swelling, SOB or lightheadedness with hypotension: Yes Has patient had a PCN reaction causing severe rash involving mucus membranes or skin necrosis: No Has patient had a PCN reaction that required hospitalization No Has patient had a PCN reaction occurring within the last 10 years: Yes If all of the above answers are "NO", then may pr  . Nortriptyline Other (See Comments)    Sleep walking  . Brimonidine Other (See Comments)    Unknown reaction per MAR   . Cyclobenzaprine Other (See Comments)    Hallucinations  . Tizanidine Other (See Comments)    Hallucinations  . Gabapentin Rash   Prior to Admission medications   Medication Sig Start Date End Date Taking? Authorizing Provider  acetaminophen (TYLENOL) 325 MG tablet Take 2 tablets (650 mg total) by mouth every 6 (six) hours as needed for moderate pain (headache). 02/28/18  Yes Auburn Bilberry, MD  albuterol (PROVENTIL HFA;VENTOLIN HFA) 108 (90 Base) MCG/ACT inhaler Inhale into the lungs. 04/24/14  Yes [provider]  amLODipine (NORVASC) 5 MG tablet Take 5 mg by mouth daily. 09/29/17  Yes [provider]  aspirin EC 81 MG tablet Take 81 mg by mouth daily.   Yes [provider]  baclofen (LIORESAL) 10 MG tablet Take 10 mg by mouth 2 (two) times daily as needed for muscle spasms.    Yes [provider]  clopidogrel (PLAVIX) 75 MG tablet TAKE 1 TABLET BY MOUTH EVERY DAY 09/20/17  Yes Chuck Hint, MD   clotrimazole-betamethasone (LOTRISONE) cream Apply 1 application topically 2 (two) times daily. 01/02/18  Yes Fisher, Roselyn Bering, PA-C  desonide (DESOWEN) 0.05 % cream APPLY TO AFFECTED AREA TWICE A DAY 11/03/17  Yes [provider]  guaiFENesin (MUCINEX) 600 MG 12 hr tablet Take 1 tablet (600 mg total) by mouth 2 (two) times daily. 02/28/18  Yes Auburn Bilberry, MD  hydrOXYzine (ATARAX/VISTARIL) 10 MG tablet Take 10 mg by mouth at bedtime.   Yes [provider]  ipratropium-albuterol (DUONEB) 0.5-2.5 (3) MG/3ML SOLN Take 3 mLs by nebulization every 6 (six) hours as needed. Dx:J44.9 12/21/16  Yes Merwyn Katos, MD  [employer] Omega-3 500 MG CAPS Take 1 tablet by mouth daily.   Yes [provider]  levothyroxine (SYNTHROID, LEVOTHROID) 200 MCG tablet Take 200 mcg by mouth  daily before breakfast.   Yes [provider]  metoprolol tartrate (LOPRESSOR) 25 MG tablet Take 1 tablet (25 mg total) by mouth 2 (two) times daily. 07/27/16  Yes Katharina Caper, MD  Multiple Vitamin (MULTIVITAMIN WITH MINERALS) TABS tablet Take 1 tablet by mouth daily.   Yes [provider]  oxyCODONE (OXY IR/ROXICODONE) 5 MG immediate release tablet Take 1 tablet (5 mg total) by mouth every 6 (six) hours as needed for moderate pain. 02/28/18  Yes Auburn Bilberry, MD  OXYGEN 4 L/min by Intratracheal route as needed (for shortness of breath).    Yes [provider]  polyethylene glycol (MIRALAX / GLYCOLAX) packet Take 17 g by mouth 2 (two) times daily.   Yes [provider]  senna-docusate (SENOKOT-S) 8.6-50 MG tablet Take 1 tablet by mouth 2 (two) times daily.   Yes [provider]  SYMBICORT 160-4.5 MCG/ACT inhaler TAKE 2 PUFFS BY MOUTH TWICE A DAY 01/18/18  Yes Merwyn Katos, MD  tiotropium (SPIRIVA) 18 MCG inhalation capsule Place 1 capsule (18 mcg total) into inhaler and inhale daily. 10/11/17  Yes Auburn Bilberry, MD    Review of Systems  Constitutional:  Negative for appetite change and fatigue.  HENT: Negative for congestion, postnasal drip and sore throat.   Eyes: Negative.   Respiratory: Positive for shortness of breath (minimal ). Negative for cough, chest tightness and wheezing.   Cardiovascular: Positive for leg swelling (better). Negative for chest pain and palpitations.  Gastrointestinal: Negative for abdominal distention and abdominal pain.  Endocrine: Negative.   Genitourinary: Negative.   Musculoskeletal: Positive for neck pain and neck stiffness.  Skin: Negative.   Allergic/Immunologic: Negative.   Neurological: Negative for dizziness, syncope and light-headedness.  Hematological: Negative for adenopathy. Bruises/bleeds easily.  Psychiatric/Behavioral: Positive for sleep disturbance (sleeping on 1 pillow with oxygen at 2L). Negative for dysphoric mood and suicidal ideas. The patient is not nervous/anxious.    Vitals:   03/22/18 0932  BP: 130/64  Pulse: 71  Resp: 18  SpO2: 95%  Weight: 235 lb (106.6 kg)  Height: 5\' 8"  (1.727 m)   Wt Readings from Last 3 Encounters:  03/22/18 235 lb (106.6 kg)  02/23/18 243 lb 9.7 oz (110.5 kg)  02/21/18 242 lb 3.2 oz (109.9 kg)   Lab Results  Component Value Date   CREATININE 0.85 02/24/2018   CREATININE 1.09 02/23/2018   CREATININE 1.03 10/10/2017   Physical Exam  Constitutional: He is oriented to person, place, and time. He appears well-developed and well-nourished.  HENT:  Head: Normocephalic and atraumatic.  Neck: No JVD present. Decreased range of motion present.  Cardiovascular: Normal rate and regular rhythm.  Pulmonary/Chest: Effort normal. He has no wheezes. He has no rales.  Abdominal: Soft. He exhibits no distension. There is no tenderness.  Musculoskeletal: He exhibits edema (2+ pitting edema bilateral lower legs).  Neurological: He is alert and oriented to person, place, and time.  Skin: Skin is warm and dry.  Psychiatric: He has a normal mood and affect. His  behavior is normal. Thought content normal.  Nursing note and vitals reviewed.   Assessment & Plan:  1: Chronic heart failure with preserved ejection fraction- - NYHA class II - mildly fluid overloaded today with edema - being weighed daily at [facility]. Reminded to call for an overnight weight gain of >2 pounds or a weekly weight gain of >5 pounds.  - unable to get actual weight today as patient is currently non-weight bearing due to fractures  in left foot - not adding salt  - BNP on 10/04/17 was 221.0 - PharmD reconciled medications - encouraged him to elevate legs when sitting for long periods of time - encouraged him to wear support socks daily on the right leg with removal at bedtime  2: HTN- - BP looks good today - seeing PCP at [facility] right now - BMP 02/24/18 reviewed and showed sodium 141, potassium 4.7, creatinine 0.85  and GFR >60  3: COPD- - has oxygen at 2L that he wears around the clock   - saw pulmonologist Sung Amabile) 09/16/17 and returns tomorrow to get clearance for pending foot surgery  4: Charcot of left foot- - saw podiatry 03/17/18 - surgery is pending  Facility medication list was reviewed.   Return in 6 months or sooner for any questions/problems before then.

## 2018-03-21 ENCOUNTER — Telehealth: Payer: Self-pay | Admitting: *Deleted

## 2018-03-21 IMAGING — DX DG CHEST 1V PORT
1 series · 1 of 1 positions shown · non-contrast
Comparison: Portable chest x-ray July 17, 2016.

CLINICAL DATA: Respiratory failure, intubated patient, history of
COPD.

EXAM:
PORTABLE CHEST 1 VIEW

[chest ap]
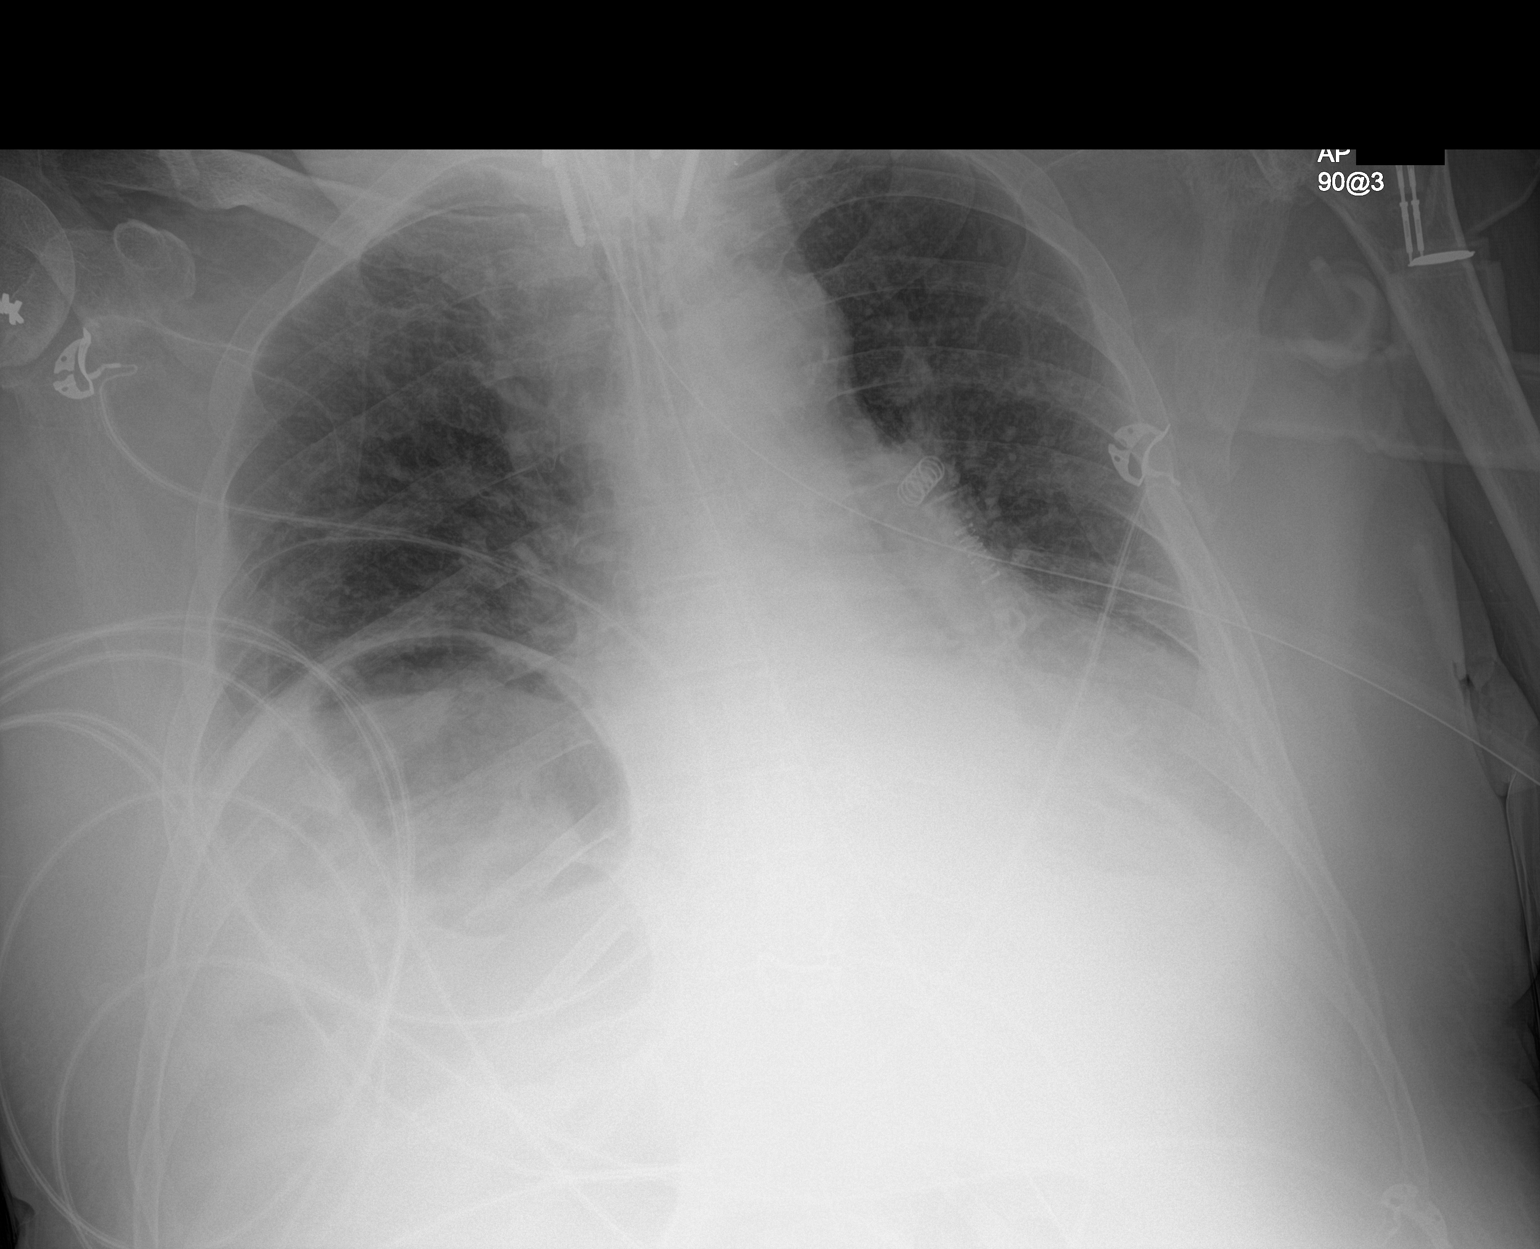

[1 of 1 positions shown; findings below may reference images not displayed]

FINDINGS: The right hemidiaphragm is elevated. A loop of bowel is present the
to it. The left lung is well-expanded. The interstitial markings of
both lungs are increased. The left hemidiaphragm is further obscured
today. The cardiac silhouette is enlarged. The pulmonary vascularity
is engorged. The endotracheal tube tip lies approximately 3 cm above
the carina. The esophagogastric tube tip and proximal port project
below the inferior margin of the image. The patient has undergone
previous posterior fusion in the cervical spine.
IMPRESSION: Worsening of interstitial edema. Left lower lobe atelectasis or
pneumonia is worsening as well.

## 2018-03-21 NOTE — Telephone Encounter (Addendum)
"  I'm calling to see what's going on about my surgery.  I'm supposed to hear from someone about scheduling it and Dr. Logan Bores had said he would get another x-ray of my ankle before I had the surgery.  He said he would come here to do that because they have a portable machine.  Something needs to be done soon because this rehab facility is getting expensive."  I am not aware of any of this at this time.  You saw him in the [place] office on Friday correct?  "Yes, that is correct."  I see in Dr. Logan Bores' note that he wants medical clearance from Dr. Jamelle Rushing.  "It's that what he's waiting on!  I will see the doctor today and I will tell him to get right on this.  I need to have this done as soon as possible."  (I have the consent but I don't have the scheduling sheet.)

## 2018-03-22 ENCOUNTER — Encounter: Payer: Self-pay | Admitting: Family

## 2018-03-22 ENCOUNTER — Ambulatory Visit
Admission: RE | Admit: 2018-03-22 | Discharge: 2018-03-22 | Disposition: A | Discharge status: Home / Self Care | Payer: No Typology Code available for payment source | Source: Ambulatory Visit | Attending: Podiatry | Admitting: Podiatry

## 2018-03-22 ENCOUNTER — Ambulatory Visit: Payer: No Typology Code available for payment source | Admitting: Family

## 2018-03-22 VITALS — BP 130/64 | HR 71 | Resp 18 | Ht 68.0 in | Wt 235.0 lb

## 2018-03-22 DIAGNOSIS — Z79899 Other long term (current) drug therapy: Secondary | ICD-10-CM | POA: Diagnosis not present

## 2018-03-22 DIAGNOSIS — S92255A Nondisplaced fracture of navicular [scaphoid] of left foot, initial encounter for closed fracture: Secondary | ICD-10-CM

## 2018-03-22 DIAGNOSIS — I110 Hypertensive heart disease with heart failure: Secondary | ICD-10-CM | POA: Insufficient documentation

## 2018-03-22 DIAGNOSIS — Z7982 Long term (current) use of aspirin: Secondary | ICD-10-CM | POA: Insufficient documentation

## 2018-03-22 DIAGNOSIS — J449 Chronic obstructive pulmonary disease, unspecified: Secondary | ICD-10-CM | POA: Diagnosis not present

## 2018-03-22 DIAGNOSIS — F329 Major depressive disorder, single episode, unspecified: Secondary | ICD-10-CM | POA: Insufficient documentation

## 2018-03-22 DIAGNOSIS — M1990 Unspecified osteoarthritis, unspecified site: Secondary | ICD-10-CM | POA: Insufficient documentation

## 2018-03-22 DIAGNOSIS — I10 Essential (primary) hypertension: Secondary | ICD-10-CM

## 2018-03-22 DIAGNOSIS — Z79890 Hormone replacement therapy (postmenopausal): Secondary | ICD-10-CM | POA: Insufficient documentation

## 2018-03-22 DIAGNOSIS — M14672 Charcot's joint, left ankle and foot: Secondary | ICD-10-CM

## 2018-03-22 DIAGNOSIS — A5216 Charcot's arthropathy (tabetic): Secondary | ICD-10-CM | POA: Insufficient documentation

## 2018-03-22 DIAGNOSIS — I509 Heart failure, unspecified: Secondary | ICD-10-CM | POA: Insufficient documentation

## 2018-03-22 DIAGNOSIS — Z981 Arthrodesis status: Secondary | ICD-10-CM | POA: Diagnosis not present

## 2018-03-22 DIAGNOSIS — M19011 Primary osteoarthritis, right shoulder: Secondary | ICD-10-CM | POA: Diagnosis not present

## 2018-03-22 DIAGNOSIS — J410 Simple chronic bronchitis: Secondary | ICD-10-CM

## 2018-03-22 DIAGNOSIS — I5032 Chronic diastolic (congestive) heart failure: Secondary | ICD-10-CM

## 2018-03-22 DIAGNOSIS — Z87891 Personal history of nicotine dependence: Secondary | ICD-10-CM | POA: Diagnosis not present

## 2018-03-22 DIAGNOSIS — X58XXXA Exposure to other specified factors, initial encounter: Secondary | ICD-10-CM | POA: Insufficient documentation

## 2018-03-22 IMAGING — DX DG CHEST 1V PORT
1 series · 1 of 1 positions shown · non-contrast
Comparison: 07/20/2016

CLINICAL DATA: Respiratory failure

EXAM:
PORTABLE CHEST 1 VIEW

[chest ap]
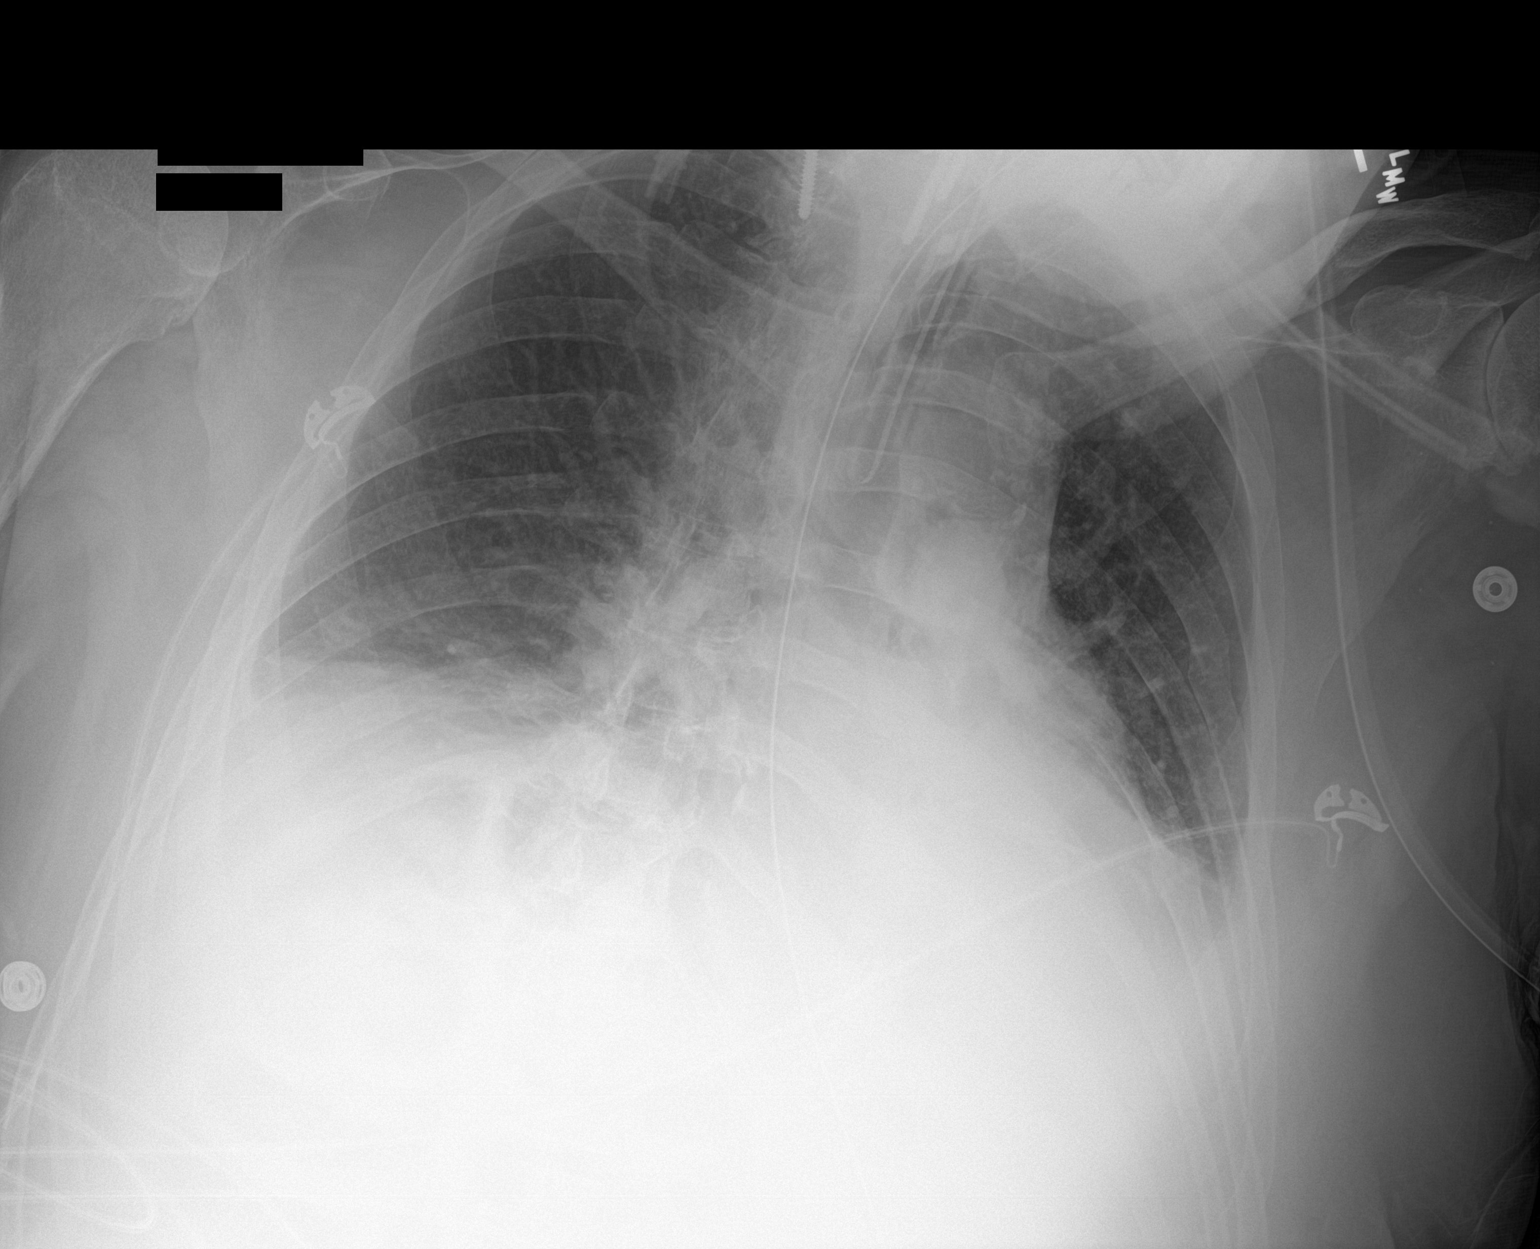

[1 of 1 positions shown; findings below may reference images not displayed]

FINDINGS: Cardiac shadow is stable. Endotracheal tube and nasogastric catheter
are again noted in satisfactory position. Postsurgical changes in
the cervical spine are seen. Bibasilar atelectatic changes and small
effusions are noted slightly increased from the prior exam. No bony
abnormality is noted.
IMPRESSION: Increasing bibasilar atelectasis and effusions.

## 2018-03-22 NOTE — Telephone Encounter (Signed)
I brought the consent form and Dr. Logan Bores needs to fill out a scheduling sheet. Angie didn't have one with the consent.

## 2018-03-22 NOTE — Patient Instructions (Signed)
Continue weighing daily and call for an overnight weight gain of > 2 pounds or a weekly weight gain of >5 pounds. 

## 2018-03-23 ENCOUNTER — Ambulatory Visit (INDEPENDENT_AMBULATORY_CARE_PROVIDER_SITE_OTHER): Payer: Medicare Other | Admitting: Pulmonary Disease

## 2018-03-23 ENCOUNTER — Encounter: Payer: Self-pay | Admitting: Pulmonary Disease

## 2018-03-23 VITALS — BP 130/60 | HR 72 | Resp 16 | Ht 68.0 in

## 2018-03-23 DIAGNOSIS — I6523 Occlusion and stenosis of bilateral carotid arteries: Secondary | ICD-10-CM | POA: Diagnosis not present

## 2018-03-23 DIAGNOSIS — J449 Chronic obstructive pulmonary disease, unspecified: Secondary | ICD-10-CM

## 2018-03-23 DIAGNOSIS — J986 Disorders of diaphragm: Secondary | ICD-10-CM

## 2018-03-23 DIAGNOSIS — I2781 Cor pulmonale (chronic): Secondary | ICD-10-CM | POA: Diagnosis not present

## 2018-03-23 DIAGNOSIS — J9612 Chronic respiratory failure with hypercapnia: Secondary | ICD-10-CM | POA: Diagnosis not present

## 2018-03-23 DIAGNOSIS — J9611 Chronic respiratory failure with hypoxia: Secondary | ICD-10-CM

## 2018-03-23 DIAGNOSIS — Z01811 Encounter for preprocedural respiratory examination: Secondary | ICD-10-CM

## 2018-03-23 NOTE — Patient Instructions (Signed)
Continue supplemental oxygen as close to 24 hours/day as possible Continue Symbicort, Spiriva inhalers Continue albuterol as needed for increased shortness of breath, wheezing Avoid all opioid analgesics as they will further depress ventilatory drive which is already impaired  Any surgery would be extremely high risk for him.  If a surgery is deemed necessary, recommend local anesthesia rather than general anesthesia.  Note that he is a very difficult intubation and if he were to run into respiratory problems perioperatively, this would be a crisis which could perhaps require emergent tracheostomy tube placement.  Goals of care need to be addressed very completely prior to surgery given the high likelihood of potentially catastrophic consequences  Follow-up in 3 months.  Call sooner if needed

## 2018-03-23 NOTE — Progress Notes (Signed)
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 Extubated 07/21/16    DATA: CT chest 03/23/16: Right hemidiaphragm is elevated. No pleural fluid on the left. Minimal hazy infiltrate present in the apex of the left upper lobe. The right chest shows a small effusion largely layering dependently. There is extensive airspace filling in the upper lobe consistent with bronchopneumonia. There is patchy density in the right lower lobe in the right middle lobe that could be atelectasis or lesser bronchopneumonia. The appearance is improved since the CT of 03/03/16 Echocardiogram 07/17/16: grade I diastolic dysfunction. LVEF 60%. LA mildly dilated. RV mildly dilated CT head and neck 09/26/16: No acute intracranial abnormality identified. Small scalp contusion in the superior frontal region. No displaced calvarial fracture. 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 No acute fracture of the cervical spine. Stable postsurgical changes related to occipital to T2 fusion, C1 the C6 posterior decompression. No apparent hardware related complication. Stable advanced cervical spondylosis greatest at the C3 through C5 levels. Hospitalized 04/08-04/10/18 for hypercapnic respiratory failure Hospitalized 03/22-03/25/19 worsening LE edema and dyspnea. Given dx of PNA based on CT chest findings but his presentation was not consistent with PNA CT chest 09/09/17: Elevated right hemidiaphragm with RLL atelectasis Hospitalized 04/16-04/22/19: acute/chronic hypercarbic failure due to  AECOPD CT chest 10/09/17: Mild right upper lobe pneumonia. Elevated right hemidiaphragm with compressive atelectasis in the adjacent right lower lobe and right middle lobe.  INTERVAL: Last seen by me 02/21/2018.  Hospitalized 9/5-9/03/2018 after a fall.  Discharge diagnosis of acute on chronic hypercarbic respiratory failure due to COPD exacerbation.  Also noted to have dislocated fracture involving his left foot and nondisplaced proximal fibular fracture.  SUBJ: His follow-up was scheduled for "preoperative pulmonary evaluation".  As I understand it, surgery on his left foot is planned.  It is not clear to me whether this procedure has been scheduled.  It is not clear to me precisely what is planned surgically.  The patient will has no new complaints.  He is more somnolent than usual.  He denies shortness of breath, cough, purulent sputum, hemoptysis, chest pain.  He has chronic lower extremity edema, not significantly changed from baseline.  He denies calf tenderness.  He informs me that he has been using opiate analgesics for pain related to his recent injury.  After his recent hospitalization, he was discharged to [facility] facility.   OBJ: Vitals:   03/23/18 0855 03/23/18 0905  BP:  130/60  Pulse:  72  Resp: 16   SpO2:  93%  Height: 5\' 8"  (1.727 m)   2 LPM   Gen: Somnolent but answers questions seemingly accurately. HEENT: NCAT, sclerae white Neck: S/P fusion with very limited mobility, no JVD noted Lungs: Breath sounds diffusely moderately diminished without wheezes or other adventitious sounds Cardiovascular: Regular, no M Abdomen: Soft, NT, NABS Ext: Symmetric pretibial, ankle, pedal edema Neuro: Cranial nerves grossly intact, moves all extremities without focal deficits Skin: Limited exam, no lesions noted    DATA: BMP Latest Ref Rng & Units 02/24/2018 02/23/2018 10/10/2017  Glucose 70 - 99 mg/dL 161(W) 960(A) 83  BUN 8 - 23 mg/dL 18 21 54(U)  Creatinine 0.61 - 1.24 mg/dL  1.61 0.96 0.45  Sodium 135 - 145 mmol/L 141 138 127(L)  Potassium 3.5 - 5.1 mmol/L 4.7 4.2 4.5  Chloride 98 - 111 mmol/L 99 95(L) 89(L)  CO2 22 - 32 mmol/L 34(H) 35(H) 33(H)  Calcium 8.9 - 10.3 mg/dL 4.0(J) 9.2 8.1(X)   CBC Latest Ref Rng & Units 02/24/2018 02/23/2018 10/05/2017  WBC 3.8 - 10.6 K/uL 8.9 11.1(H) 4.4  Hemoglobin 13.0 - 18.0 g/dL 10.5(L) 11.2(L) 10.6(L)  Hematocrit 40.0 - 52.0 % 31.1(L) 33.9(L) 31.2(L)  Platelets 150 - 440 K/uL 121(L) 129(L) 123(L)   CXR 9/9: Markedly elevated right hemidiaphragm.  No acute findings  IMPRESSION: Chronic respiratory failure with hypoxia and hypercapnia (HCC)  COPD, very severe (HCC)  Chronic paralysis of right hemidiaphragm  Cor pulmonale (chronic) (HCC)  Preop pulmonary evaluation   He is extremely high risk for general anesthesia and even for conscious sedation.  This is on the basis of severe chronic hypercarbic/hypoxemic respiratory failure due to severe COPD and chronic right hemidiaphragm paralysis.  From a pulmonary point of view he is essentially at his baseline.  There are no interventions that can be undertaken to significantly reduce his perioperative risk.  PLAN/REC: Continue supplemental oxygen as close to 24 hours/day as possible He is instructed to continue Symbicort and Spiriva inhalers as previously Cont albuterol as needed for increased SOB, wheezing, chest tightness, cough  I strongly urged that we avoid all opioid analgesics as they will further depress his ventilatory drive which is already impaired  As noted above, any surgery, even relatively minor, presents significant risk of perioperative pulmonary complications due to his tenuous baseline respiratory status.  It is also notable that he has a very difficult airway due to fused cervical spine.  If surgery is to be undertaken, I recommend avoidance of general anesthesia and minimizing conscious sedatives.  If there are reasonable alternatives to surgery, these  should be considered and discussed with the patient.  If he undergoes surgery at [facility] and requires hospitalization postoperatively, please contact pulmonary medicine service to participate in his care.  He has previously been designated DNR and does not wish to undergo prolonged mechanical ventilation again in the future.  Follow-up in this office in 3 months.  He is to call sooner if needed  Billy Fischer, MD PCCM service Mobile [phone] Pager [phone] 03/23/2018 2:32 PM

## 2018-03-23 NOTE — Telephone Encounter (Signed)
I faxed a medical clearance letter request to Dr. Jamelle Rushing.

## 2018-03-24 ENCOUNTER — Telehealth: Payer: Self-pay | Admitting: Pulmonary Disease

## 2018-03-24 IMAGING — DX DG CHEST 1V PORT
1 series · 1 of 1 positions shown · non-contrast
Comparison: 07/21/2016

CLINICAL DATA: Respiratory failure

EXAM:
PORTABLE CHEST 1 VIEW

[chest ap]
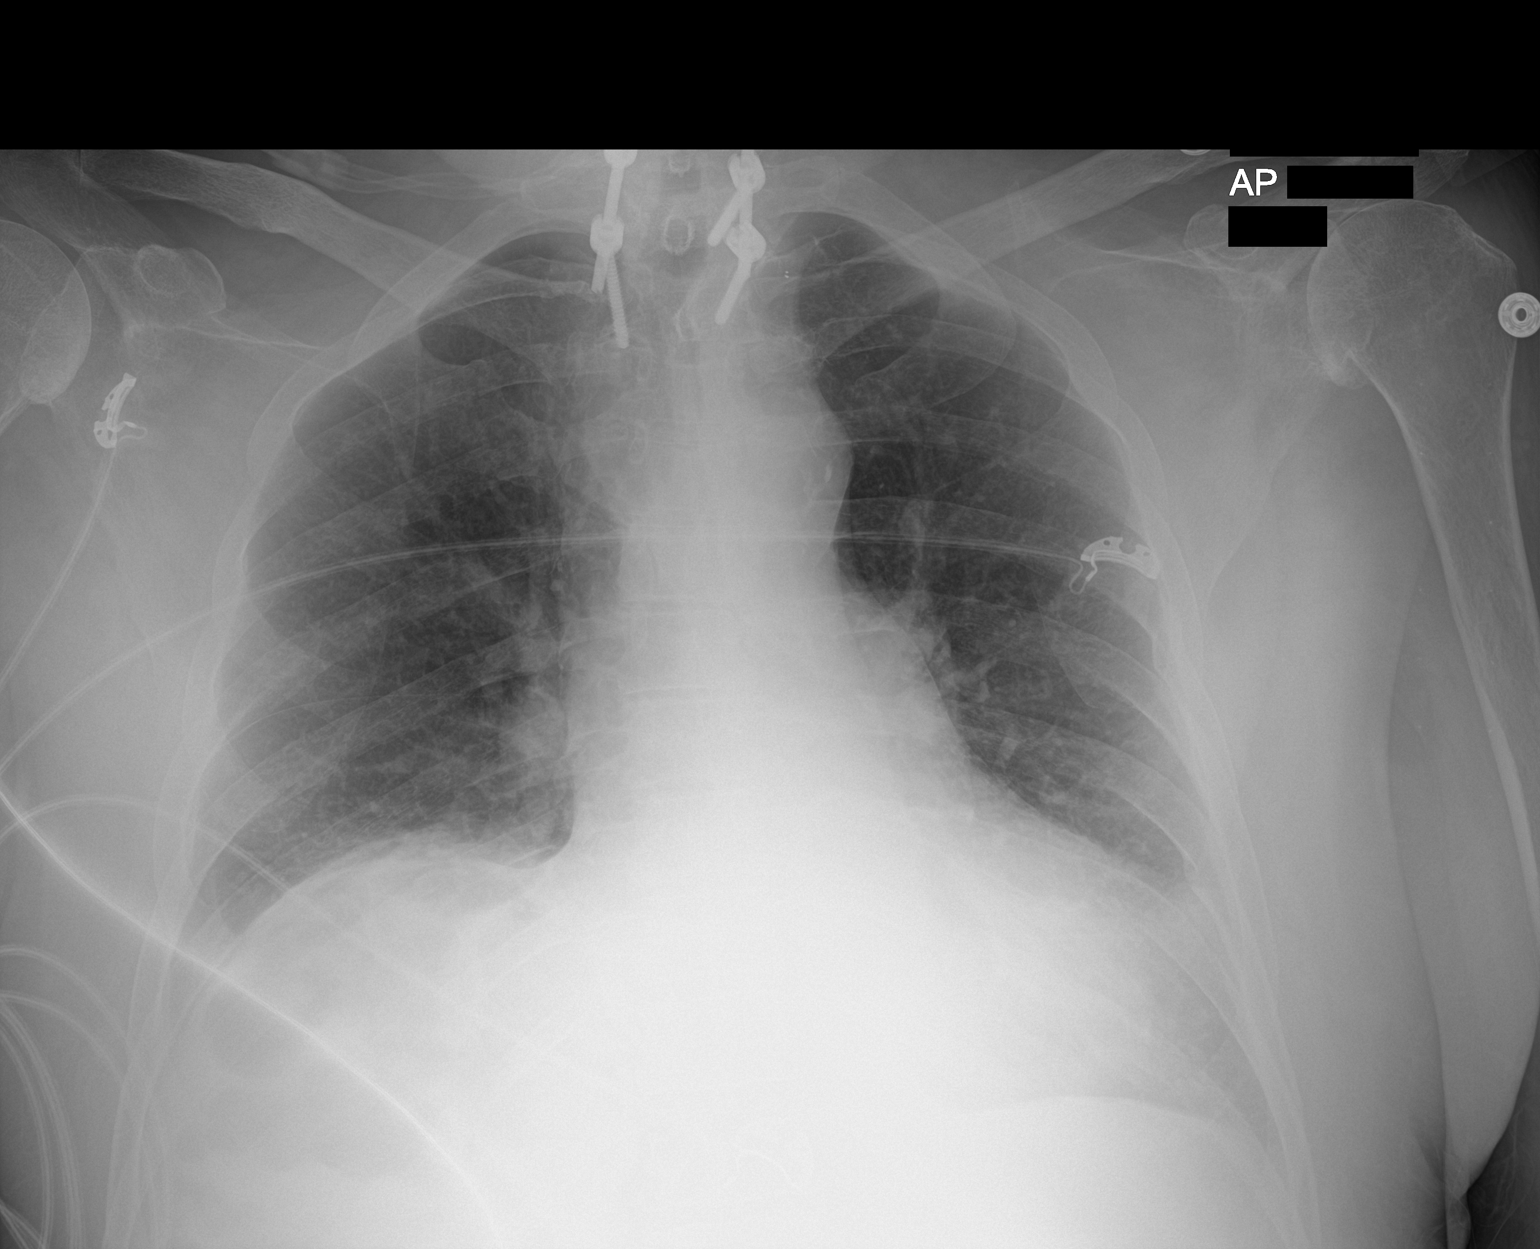

[1 of 1 positions shown; findings below may reference images not displayed]

FINDINGS: Cardiac shadow is mildly enlarged but stable. Elevation of the right
hemidiaphragm is again seen. Minimal right basilar atelectasis is
noted. Postsurgical changes in the cervicothoracic spine are noted.
IMPRESSION: Mild right basilar atelectasis.

## 2018-03-24 NOTE — Telephone Encounter (Signed)
Heather from [facility] in [place] calling States the office notes faxed over on patient yesterday were incomplete, they were having issues with fax machine Would like for office notes to be re-faxed

## 2018-03-24 NOTE — Telephone Encounter (Signed)
Notes refaxed. Heather aware.

## 2018-03-27 ENCOUNTER — Other Ambulatory Visit: Payer: Self-pay

## 2018-03-27 DIAGNOSIS — S92255A Nondisplaced fracture of navicular [scaphoid] of left foot, initial encounter for closed fracture: Secondary | ICD-10-CM

## 2018-03-27 NOTE — Progress Notes (Signed)
Order for left ankle entered in chart [facility] [place],RN notified of order. Order has been faxed to [phone]

## 2018-03-28 ENCOUNTER — Ambulatory Visit
Admission: RE | Admit: 2018-03-28 | Discharge: 2018-03-28 | Disposition: A | Discharge status: Home / Self Care | Payer: No Typology Code available for payment source | Source: Ambulatory Visit | Attending: Podiatry | Admitting: Podiatry

## 2018-03-28 DIAGNOSIS — S92255A Nondisplaced fracture of navicular [scaphoid] of left foot, initial encounter for closed fracture: Secondary | ICD-10-CM | POA: Diagnosis present

## 2018-03-28 DIAGNOSIS — S92255D Nondisplaced fracture of navicular [scaphoid] of left foot, subsequent encounter for fracture with routine healing: Secondary | ICD-10-CM | POA: Insufficient documentation

## 2018-03-28 DIAGNOSIS — S92212D Displaced fracture of cuboid bone of left foot, subsequent encounter for fracture with routine healing: Secondary | ICD-10-CM | POA: Insufficient documentation

## 2018-03-28 DIAGNOSIS — X58XXXD Exposure to other specified factors, subsequent encounter: Secondary | ICD-10-CM | POA: Insufficient documentation

## 2018-03-28 DIAGNOSIS — S82402D Unspecified fracture of shaft of left fibula, subsequent encounter for closed fracture with routine healing: Secondary | ICD-10-CM | POA: Insufficient documentation

## 2018-03-29 ENCOUNTER — Ambulatory Visit: Payer: Medicare Other | Admitting: Family

## 2018-03-29 ENCOUNTER — Telehealth: Payer: Self-pay | Admitting: *Deleted

## 2018-03-29 NOTE — Telephone Encounter (Signed)
"  I'm calling regarding Chad Huffman.  He went over to the [facility] yesterday and had a repeat x-ray, I guess they x-rayed the wrong part last week.  I been on vacation.  So, I was calling to see when you guys are looking at doing his surgery.  If you could, give me a call."  I attempted to return her call, the phone line was busy.  Chad Huffman is not scheduled for surgery yet.  We are waiting on medical clearance.

## 2018-03-31 NOTE — Telephone Encounter (Signed)
"  I am calling to see when I'm scheduled for surgery."  We do not have you scheduled at this time.  We were waiting on Medical Clearance from you doctors.  "I have done everything I'm supposed to do.  I'm ready to get this over with.  I'll give them a call and see what the hold-up is."  I called my Pulmonologist and Dr. Jamelle Rushing.  They both said that they sent clearance to Dr. Logan Bores."  I do see them in Epic.  I will print them and show them to him on Monday.  Once he reviews it I will call you to schedule it.  "Okay, sounds good.  I'll be looking forward to your call."

## 2018-04-04 NOTE — Telephone Encounter (Signed)
"  My name is Selena Batten calling from [employer].  I left a message last week.  I'm calling to see if [facility] surgery has been scheduled.  Please give me a call back."

## 2018-04-05 NOTE — Telephone Encounter (Signed)
I attempted to call Chad Huffman at [facility], she was not in the facility.  So, I spoke to [facility].  I informed her that Chad Huffman is scheduled for surgery on October 23 at [facility].  She asked for my fax number so she could fax his MAR.

## 2018-04-06 ENCOUNTER — Telehealth: Payer: Self-pay | Admitting: *Deleted

## 2018-04-06 DIAGNOSIS — S92255A Nondisplaced fracture of navicular [scaphoid] of left foot, initial encounter for closed fracture: Secondary | ICD-10-CM

## 2018-04-06 DIAGNOSIS — G5792 Unspecified mononeuropathy of left lower limb: Secondary | ICD-10-CM

## 2018-04-06 NOTE — Telephone Encounter (Signed)
"  I got your message about Chad Huffman's surgery.  It's scheduled for October 23 but at what time?"  Actually, Chad Huffman is supposed to call you regarding his surgery.  He informed me that he may refer him to [facility] due to all of the medical problems that Chad Huffman has.  "Okay just tell him to give me a call.  I'll be here late today because I was off yesterday."  I will let him know.

## 2018-04-06 NOTE — Telephone Encounter (Signed)
I informed Kim - [facility] of Chad Huffman referral and she states Dr. Dorothey Baseman has been informed of the referral also.

## 2018-04-06 NOTE — Telephone Encounter (Signed)
Dr. Logan Bores states pt has multiple fractures and dislocations of left ankle, refer to [facility] for possible surgery. Dr. Logan Bores states pt is in [facility], his nurse Selena Batten knows about the referral to [facility].

## 2018-04-06 NOTE — Telephone Encounter (Signed)
Miranda - [facility] states fax referral to [phone]. Faxed referral, clinicals and demographics to [facility].

## 2018-04-07 NOTE — Telephone Encounter (Signed)
Kim called and states pt has made an appt with [facility] and she asked where the office was pt began to yell. I informed Selena Batten of [phone] and [address] [place], [place] [address], but to check the office location of pt's doctor.

## 2018-04-07 NOTE — Telephone Encounter (Signed)
Pt called from [facility], stating he had been informed that his care had been transferred to [facility], and he was looking at how to speed up the process. I gave pt the [facility] [phone] to call and that Selena Batten - [facility] would be able to help set up his transportation to [facility].

## 2018-04-12 ENCOUNTER — Ambulatory Visit: Admit: 2018-04-12 | Payer: Medicare Other | Admitting: Podiatry

## 2018-04-12 SURGERY — OPEN REDUCTION INTERNAL FIXATION (ORIF) ANKLE FRACTURE
Anesthesia: General | Laterality: Right

## 2018-04-17 IMAGING — CR DG CHEST 2V
2 series · 3 of 3 positions shown · non-contrast
Comparison: July 23, 2016 and April 16, 2016

CLINICAL DATA: Hypertension.  History of tobacco use

EXAM:
CHEST  2 VIEW

[Series 1: chest pa · 0.14mm/px · 2 of 2 slices shown]
[im 1/2]
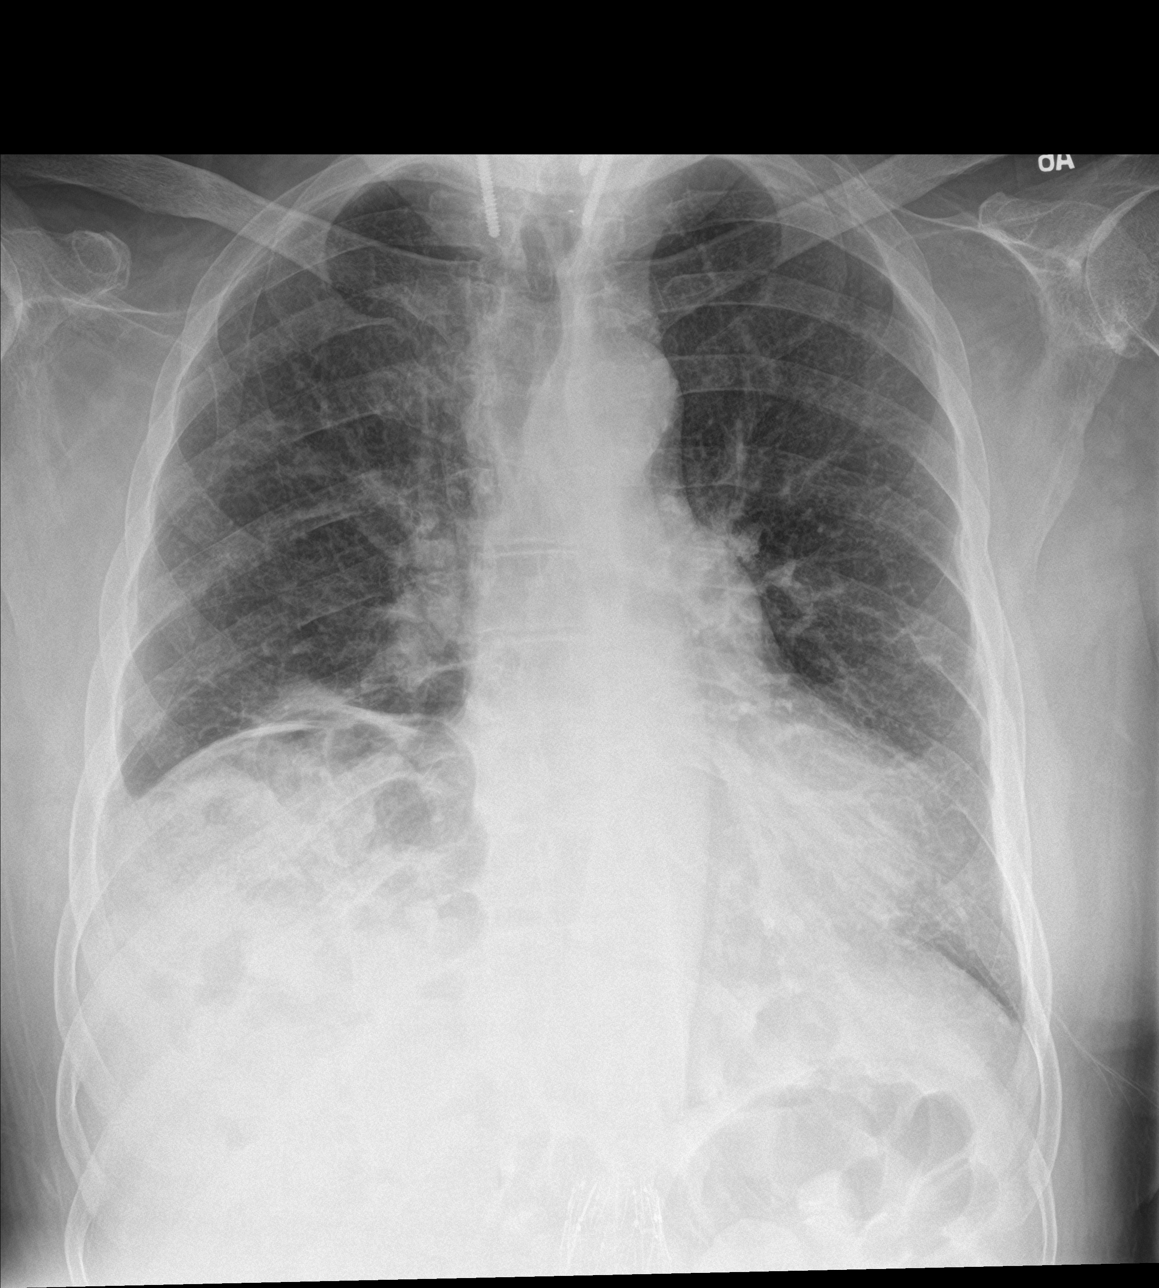
[im 2/2]
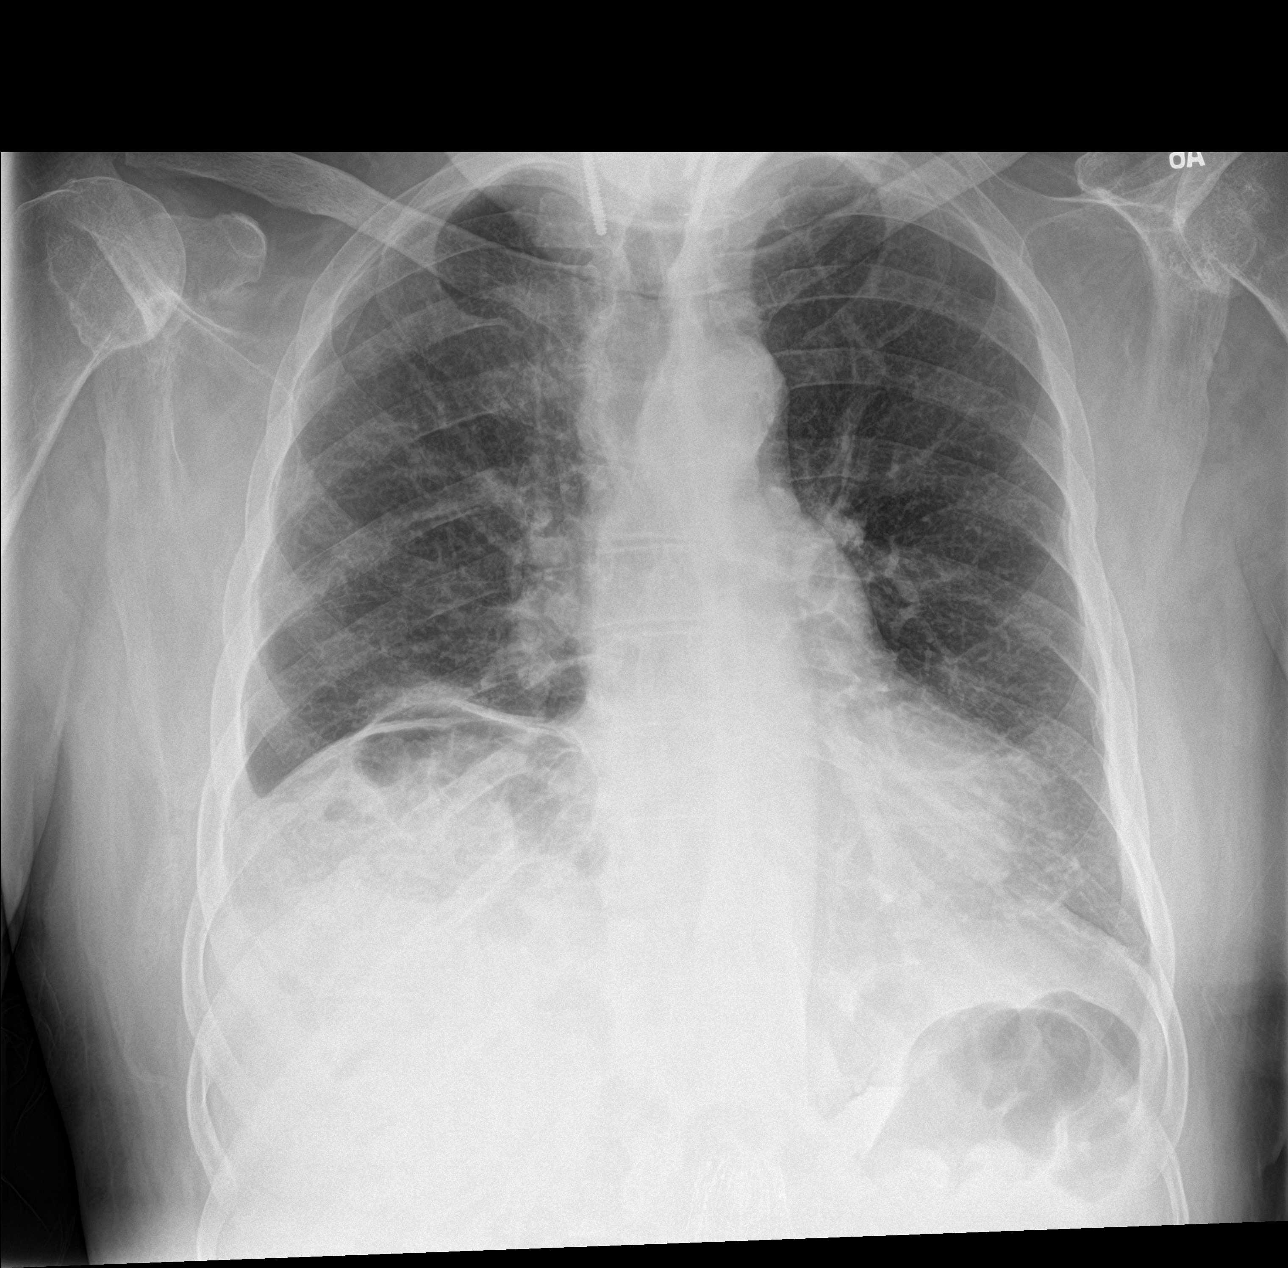

[chest lat]
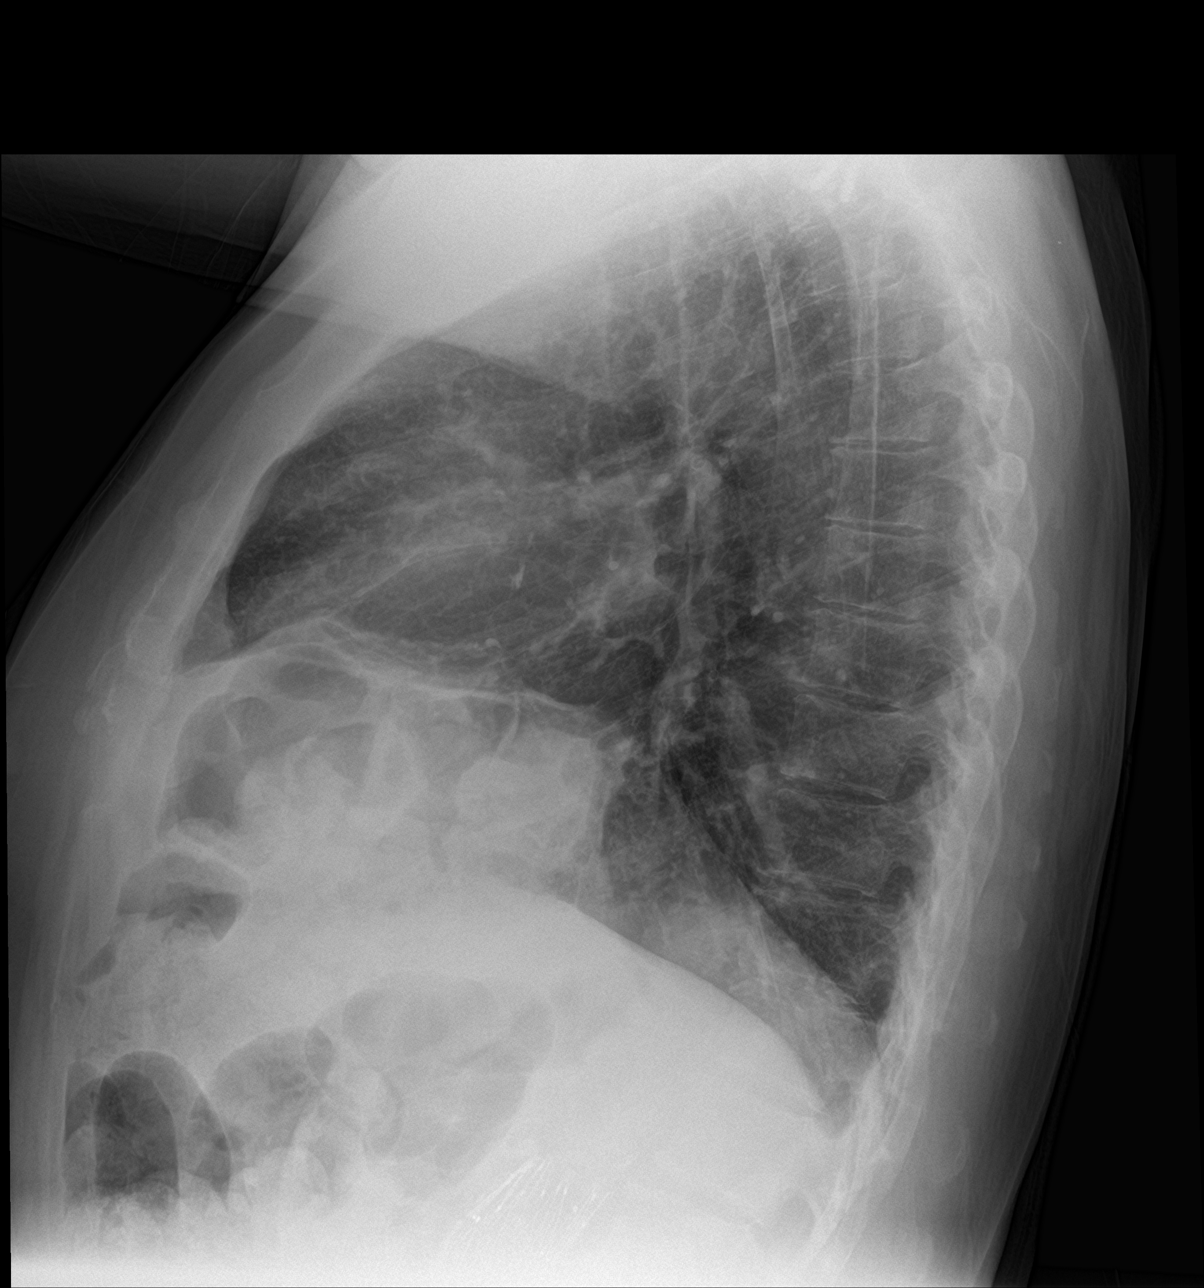

[3 of 3 positions shown; findings below may reference images not displayed]

FINDINGS: There is stable elevation of the right hemidiaphragm. There is
scarring in the right base, stable. There is no appreciable edema or
consolidation. Heart size and pulmonary vascularity are normal. No
adenopathy. There is atherosclerotic calcification in the aorta.
There is postoperative change in the cervical spine. There is a
filter in the inferior vena cava.
IMPRESSION: Stable elevation right hemidiaphragm with scarring right base. No
edema or consolidation. Stable cardiac silhouette. Aortic
atherosclerosis. Filter in inferior vena cava.

## 2018-04-20 IMAGING — DX DG CHEST 1V PORT
1 series · 1 of 1 positions shown · non-contrast
Comparison: 08/16/2016

CLINICAL DATA: Dyspnea and weakness

EXAM:
PORTABLE CHEST 1 VIEW

[chest ap]
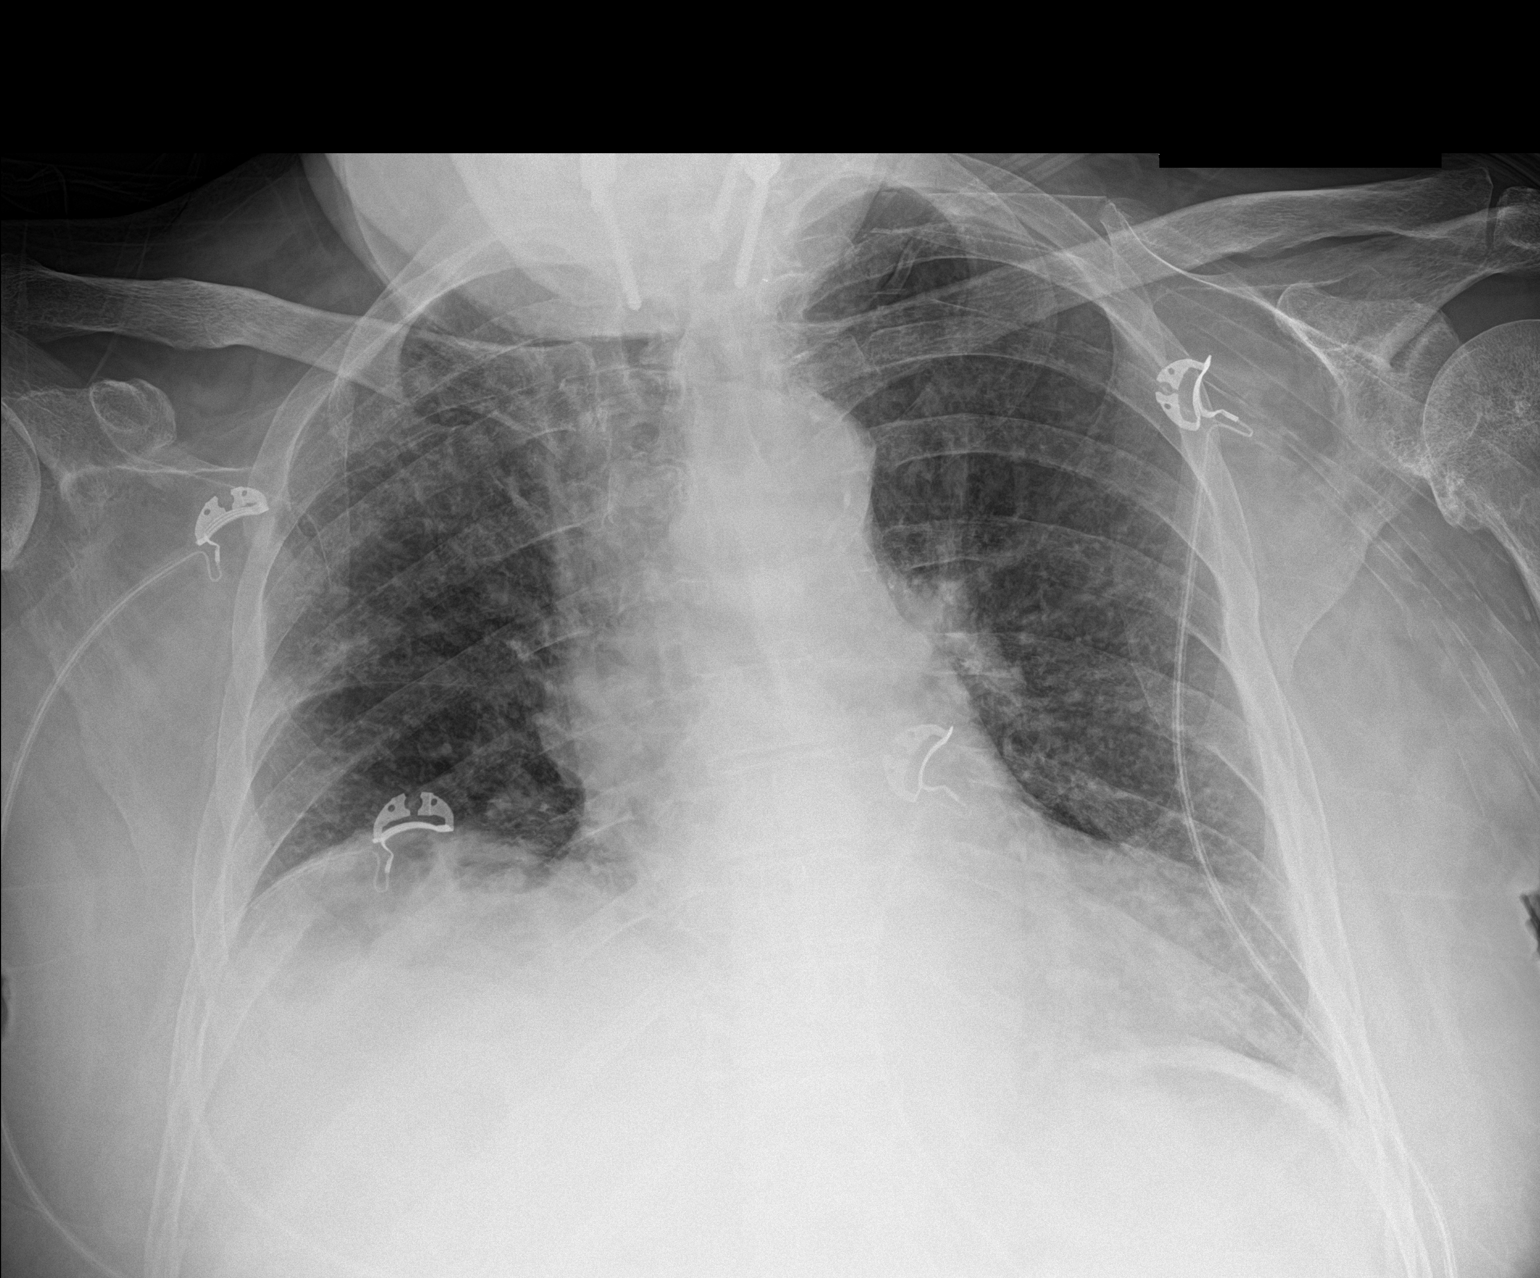

[1 of 1 positions shown; findings below may reference images not displayed]

FINDINGS: Cardiac shadow is within normal limits. The lungs are well aerated
bilaterally. Mild right basilar scarring is again seen. Elevation of
the right hemidiaphragm is again noted.
IMPRESSION: No change from prior exam.  No acute abnormality noted.

## 2018-04-21 ENCOUNTER — Encounter: Payer: Medicare Other | Admitting: Podiatry

## 2018-04-23 IMAGING — CT CT ANGIO NECK
1 of 12 series · 5 of 33 positions shown · IV contrast (APPLIED)
Comparison: 07/16/2016 head CT

CLINICAL DATA: Syncope.  Abnormal Doppler.

EXAM:
CT ANGIOGRAPHY HEAD AND NECK
TECHNIQUE: Multidetector CT imaging of the head and neck was performed using
the standard protocol during bolus administration of intravenous
contrast. Multiplanar CT image reconstructions and MIPs were
obtained to evaluate the vascular anatomy. Carotid stenosis
measurements (when applicable) are obtained utilizing NASCET
criteria, using the distal internal carotid diameter as the
denominator.
CONTRAST:  Study 5 cc Isovue 370 intravenous

[Series 10: ax thin · axial · 0.43mm/px · z∈[-349,-113]mm · 5 of 354 slices shown]
[im 59/354  soft-tissue]
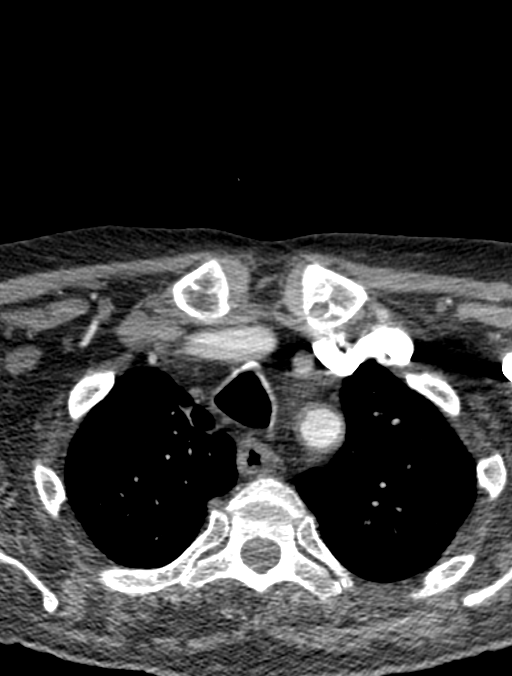
[im 118/354  bone]
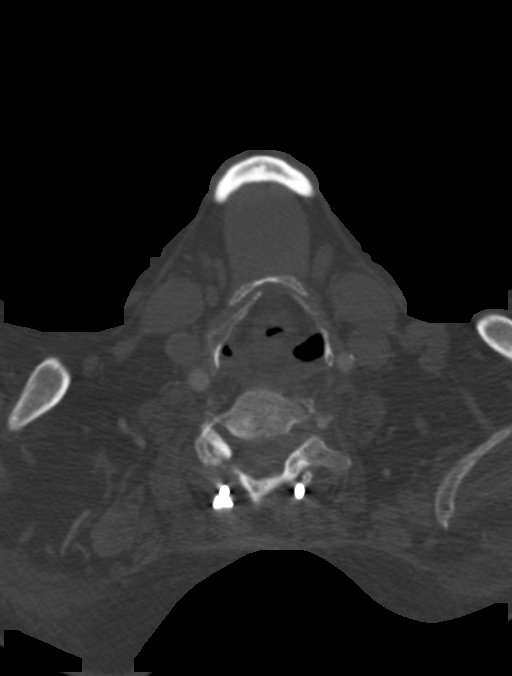
[im 177/354  soft-tissue]
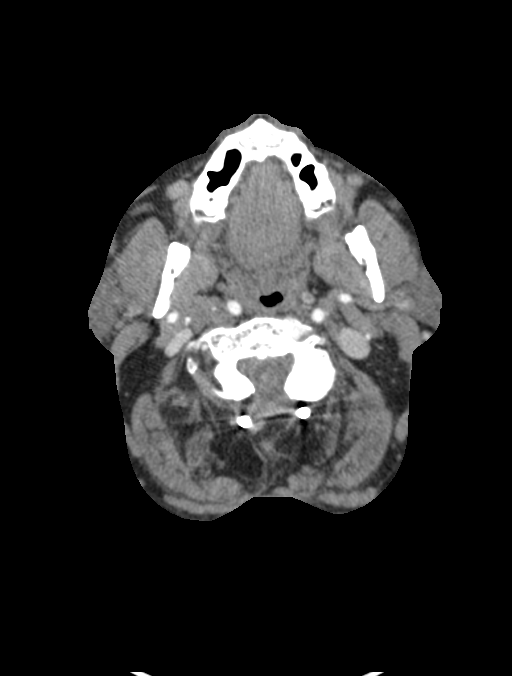
[im 236/354  bone]
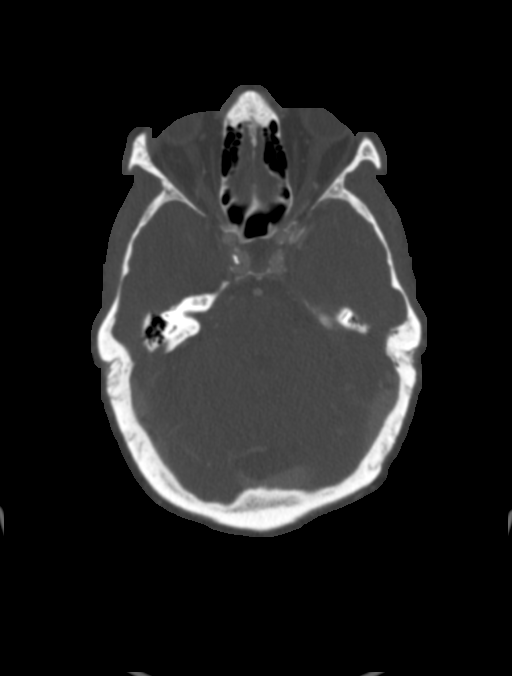
[im 295/354  soft-tissue]
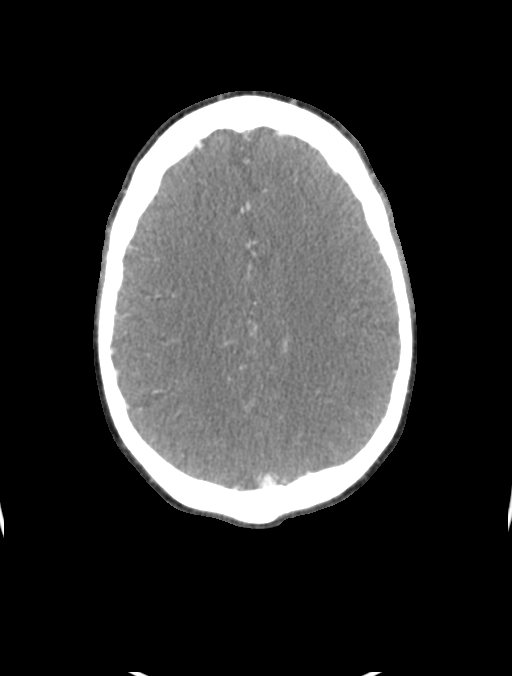

[5 of 33 positions shown; findings below may reference images not displayed]

FINDINGS: CT HEAD FINDINGS

Brain: No evidence of acute infarction, hemorrhage, hydrocephalus,
extra-axial collection or mass lesion/mass effect.

Remote infarcts affecting the right frontal operculum and left
cerebellum.

Sulcal calcifications along the right cerebral convexity,
unilateral.

Vascular: Atherosclerotic calcification.

Skull: Negative

Sinuses: Negative

Orbits: Negative

Review of the MIP images confirms the above findings

CTA NECK FINDINGS

Aortic arch: No acute finding. Atherosclerosis. Three vessel
branching

Right carotid system: Mild calcified atherosclerotic plaque at the
brachiocephalic bifurcation. Bulky calcified plaque with irregular
appearance at the ICA bulb. The distal portion of the plaque, and
associated lumen, is obscured by streak artifact from dental
amalgam. Quantification of the related stenosis is limited due to
calcified plaque blooming and irregular shape, but narrowing is
presumably advanced - correlating with Doppler findings. The plaque
is irregular in shape. No signs of dissection

Left carotid system: Atheromatous wall thickening of the common
carotid and at the bifurcation. There is an ulceration or web at the
bulb, best seen on sagittal reformats. Stenosis measures up to 50%
at the level of the luminal ridge.

Vertebral arteries: No flow limiting stenosis in the proximal
subclavian arteries. Strong left vertebral artery dominance.
Calcified plaque at the V1 and V2 segments on the left without flow
limiting stenosis. Duplicated V3 segment on the left.

Skeleton: Atlantooccipital non segmentation. Posterior fusion from
the occipital skull to T2. Diffuse posterior decompression of the
cervical spine. No acute osseous finding.

Other neck: Marked fatty atrophy of rotator cuff muscles. Changes of
prior tracheostomy.

Upper chest: Paraseptal emphysema. Paramediastinal right upper lobe
scarring. The visible main pulmonary artery appears dilated.

Review of the MIP images confirms the above findings

CTA HEAD FINDINGS

Anterior circulation: Diffuse calcified plaque on the carotid
siphons. No flow limiting stenosis or branch occlusion seen in the
anterior circulation. Negative for aneurysm.

Posterior circulation: Strongly dominant left vertebral artery, the
right vertebral artery essentially ends in PICA. Fenestrated left V3
segment. Mild left V4 segment atheromatous narrowing. No major
branch occlusion. Basilar and posterior cerebral arteries are
diffusely patent.

Venous sinuses: Patent

Anatomic variants: None unusual

Delayed phase: Negative for abnormal intracranial enhancement.

Review of the MIP images confirms the above findings
IMPRESSION: 1. Bulky calcified plaque at the proximal right ICA to a degree that
the lumen is not measurable. Implied flow limiting stenosis.
2. Ridge like structure at the left ICA bulb from ulceration or web
causes 50% luminal narrowing.
3. Strongly dominant left vertebral artery. No flow limiting
stenosis noted in the posterior or intracranial circulation.
4. Multiple asymmetric calcifications in right cerebral sulci,
suspect remote calcified plaque embolus.
5. Small remote right frontal infarct.

## 2018-05-02 ENCOUNTER — Encounter: Payer: Medicare Other | Admitting: Podiatry

## 2018-05-17 IMAGING — MR MR CERVICAL SPINE W/O CM
15 of 16 series · 39 of 48 positions shown · non-contrast
Comparison: CT 08/22/2016.  MRI 10/26/2014.

CLINICAL DATA: Recurrent syncopal episodes.

EXAM:
MRI CERVICAL SPINE WITHOUT CONTRAST
TECHNIQUE: Multiplanar, multisequence MR imaging of the cervical spine was
performed. No intravenous contrast was administered.

[Series 4: DWI · axial · 3.0mm · 1.80mm/px · z∈[-48,+114]mm · 3 of 51 slices shown (1 of 2)]
[im 1/51]
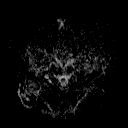
[im 26/51]
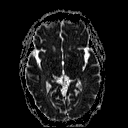
[im 51/51]
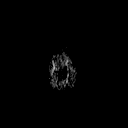

[Series 6: DWI · coronal · 3.0mm · 1.80mm/px · 4 of 49 slices shown (2 of 2)]
[im 1/49]
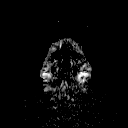
[im 17/49]
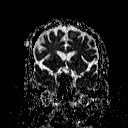
[im 33/49]
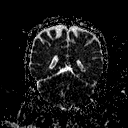
[im 49/49]
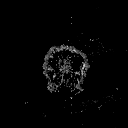

[Series 7: GRE · sagittal · 5.0mm · 0.45mm/px · 2 of 27 slices shown (1 of 2)]
[im 1/27]
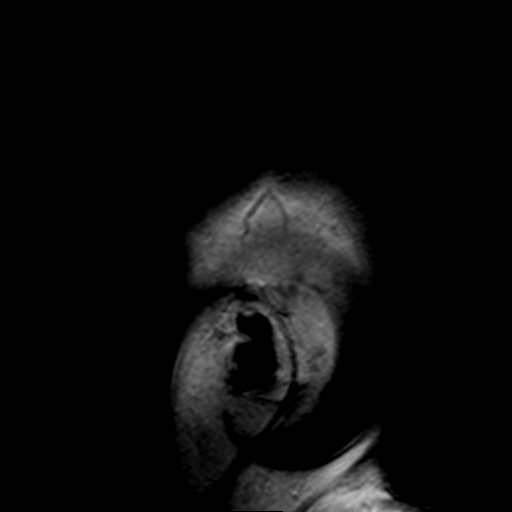
[im 27/27]
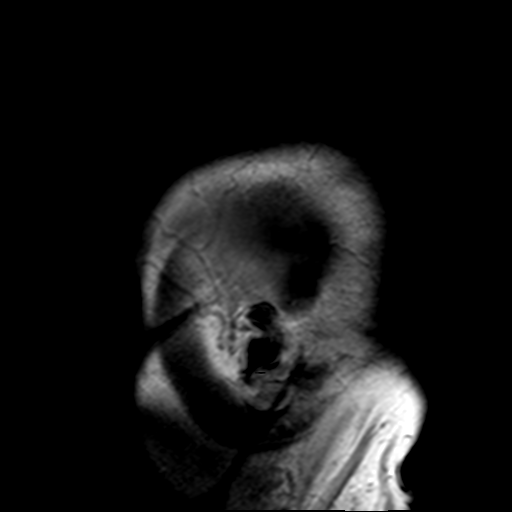

[Series 8: T2 · axial · 5.0mm · 0.45mm/px · z∈[-53,+116]mm · 2 of 27 slices shown (1 of 5)]
[im 1/27]
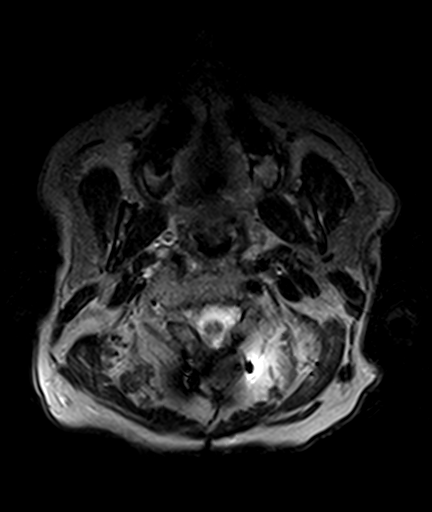
[im 27/27]
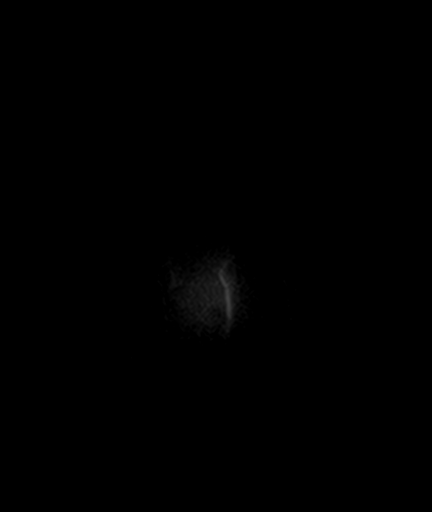

[Series 9: FLAIR · axial · 3.0mm · 0.45mm/px · z∈[-51,+114]mm · 4 of 56 slices shown]
[im 1/56]
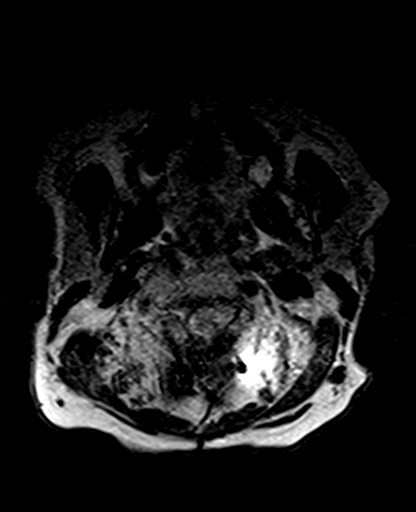
[im 19/56]
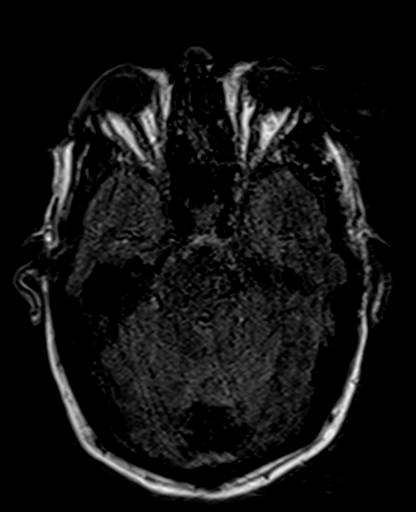
[im 37/56]
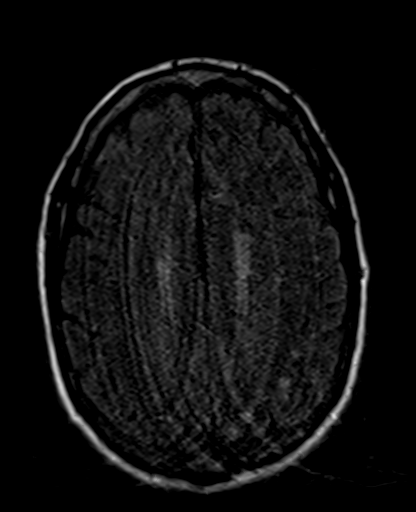
[im 56/56]
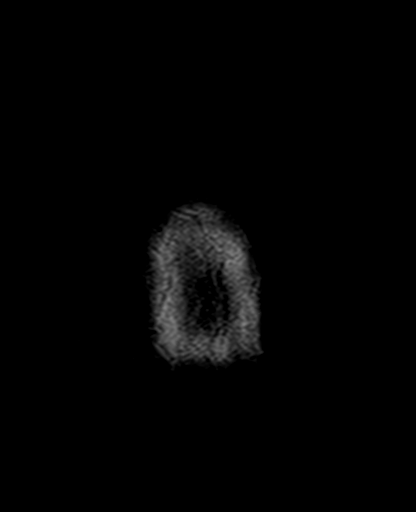

[Series 10: T1 · axial · 1.0mm · 1.00mm/px · z∈[-56,+119]mm · 8 of 176 slices shown (1 of 2)]
[im 1/176]
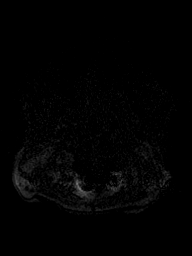
[im 30/176]
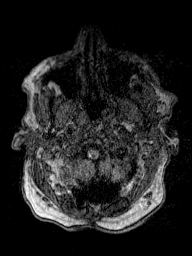
[im 59/176]
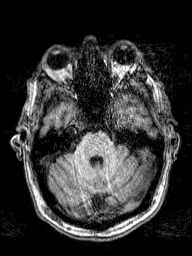
[im 73/176]
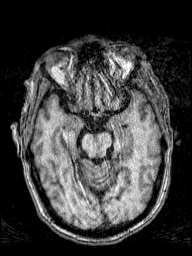
[im 103/176]
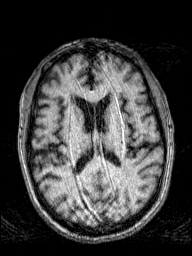
[im 117/176]
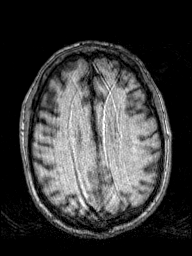
[im 146/176]
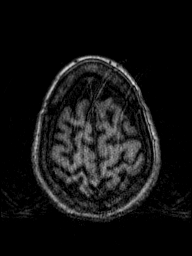
[im 176/176]
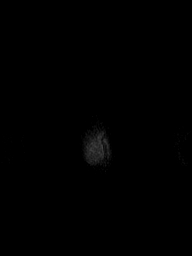

[Series 11: GRE · axial · 5.0mm · 0.45mm/px · z∈[-52,+117]mm · 2 of 27 slices shown (2 of 2)]
[im 1/27]
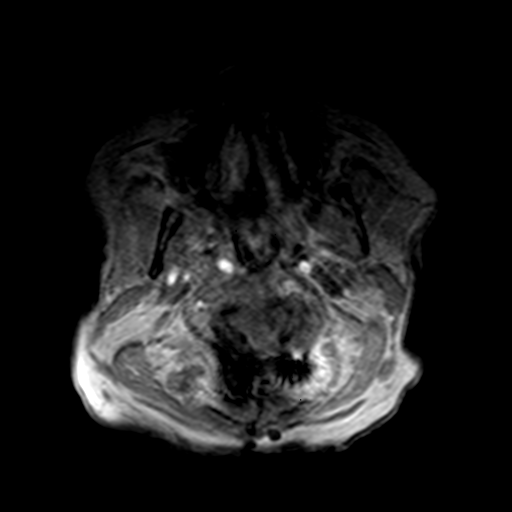
[im 27/27]
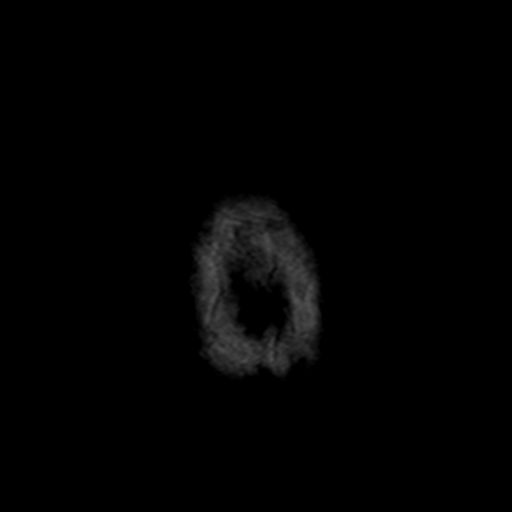

[Series 12: T2 · axial · 5.0mm · 0.45mm/px · z∈[-53,+116]mm · 2 of 27 slices shown (2 of 5)]
[im 1/27]
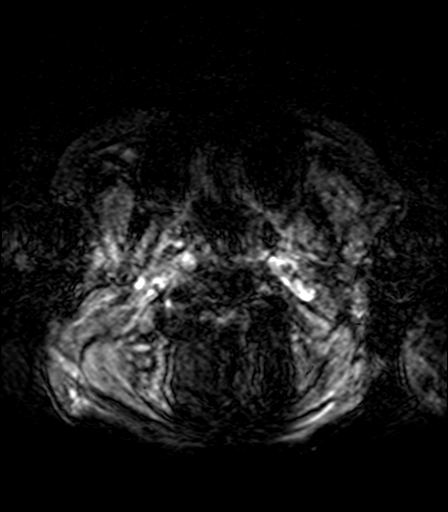
[im 27/27]
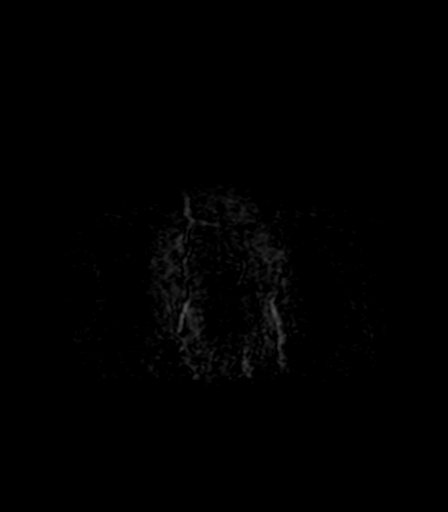

[Series 13: T2 · coronal · 5.0mm · 0.45mm/px · 2 of 27 slices shown (3 of 5)]
[im 1/27]
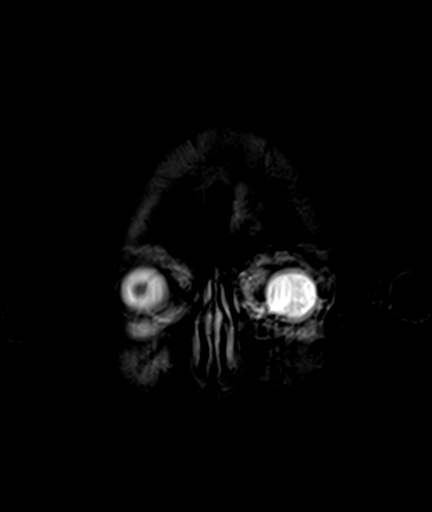
[im 27/27]
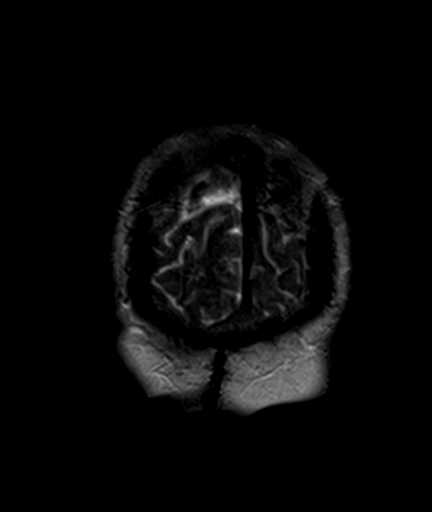

[Series 16: T2 · sagittal · 3.0mm · 0.70mm/px · 1 of 13 slices shown (4 of 5)]
[im 1/13]
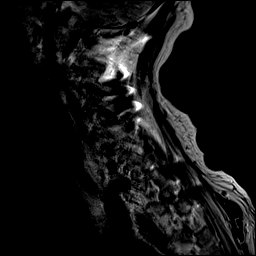

[Series 17: T1 · sagittal · 3.0mm · 0.70mm/px · 1 of 13 slices shown (2 of 2)]
[im 1/13]
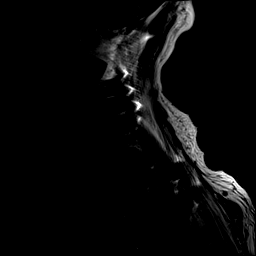

[Series 18: STIR · sagittal · 3.0mm · 0.35mm/px · 1 of 13 slices shown]
[im 1/13]
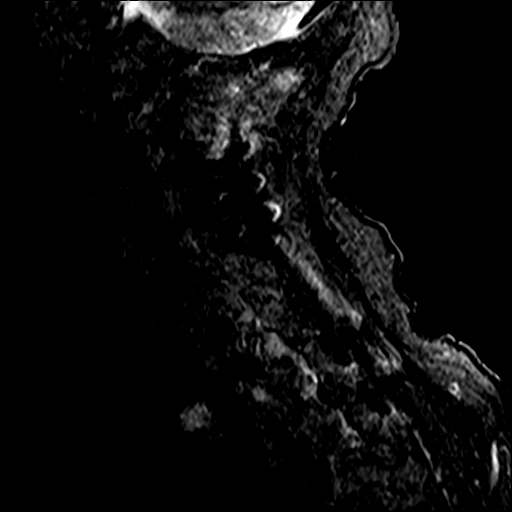

[Series 19: T2 · axial · 3.0mm · 0.70mm/px · z∈[-175,-82]mm · 2 of 27 slices shown (5 of 5)]
[im 1/27]
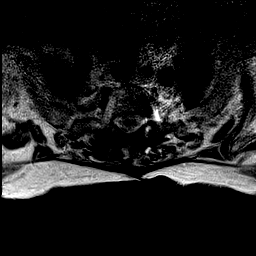
[im 27/27]
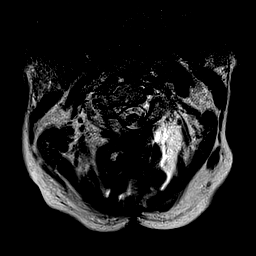

[Series 20: mpgr ax · axial · 3.0mm · 0.35mm/px · z∈[-175,-82]mm · 2 of 27 slices shown]
[im 1/27]
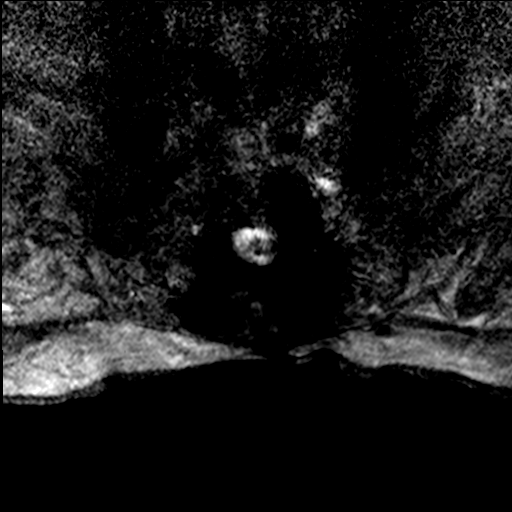
[im 27/27]
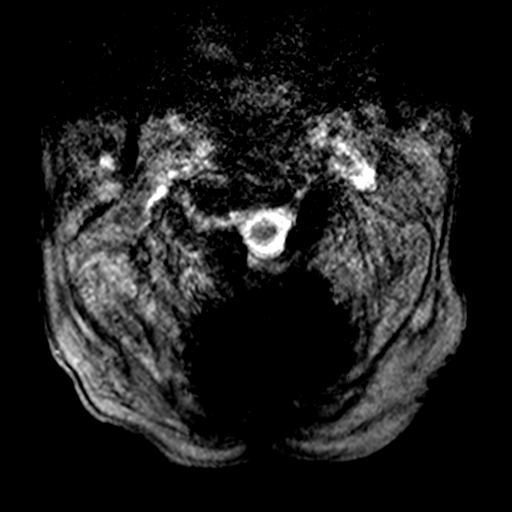

[Series 100: (id) · axial · 3.0mm · 1.80mm/px · z∈[-36,+66]mm · 3 of 49 slices shown]
[im 1/49]
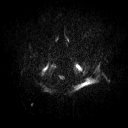
[im 17/49]
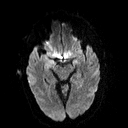
[im 33/49]
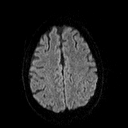

[39 of 48 positions shown; findings below may reference images not displayed]

FINDINGS: Examination is limited by extensive metal artifact from previous
fusion hardware.

Alignment: Straightening related to previous fusion from the occiput
foot through T2.

Vertebrae: Solid fusion from the skull to T2, confirmed on previous
CT studies.

Cord: Artifact in the region from C2 through C5 prevents evaluation
of the cord in that segment. Above and below that, the cord appears
normal without evidence of atrophy or gliosis.

Posterior Fossa, vertebral arteries, paraspinal tissues: See results
of brain examination. Old left cerebellar infarction.

Disc levels:

Correlating with the previous CT, there is solid fusion of thrombus
skull through T2. There is sufficient patency of the spinal canal
without evidence of cord compression. No significant foraminal
compromise.
IMPRESSION: No acute finding. No cause of syncope identified. Previous fusion
from the skull to T2 is solid based on previous CT evaluations. No
apparent cord compression or cord lesion. Cord is not visible on the
MR in the region from C2 through C5 because of metal artifact.

## 2018-05-28 IMAGING — MR MR HEAD W/O CM
10 series · 47 of 48 positions shown · non-contrast
Comparison: Priors CT from earlier the same day.

CLINICAL DATA: Initial evaluation for acute altered mental status,
fall.

EXAM:
MRI HEAD WITHOUT CONTRAST
TECHNIQUE: Multiplanar, multiecho pulse sequences of the brain and surrounding
structures were obtained without intravenous contrast.

[Series 7: GRE · sagittal · 5.0mm · 0.45mm/px · 2 of 25 slices shown (1 of 2)]
[im 1/25]
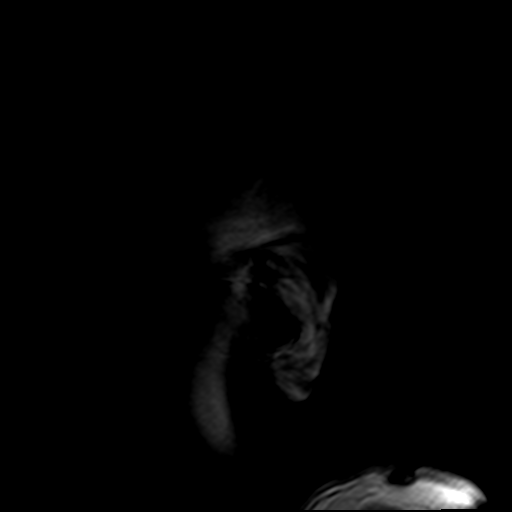
[im 25/25]
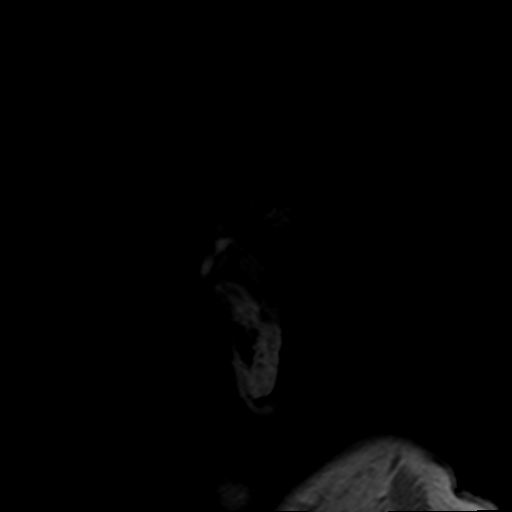

[Series 9: DWI · axial · 3.0mm · 1.80mm/px · z∈[-75,+98]mm · 7 of 58 slices shown (1 of 4)]
[im 1/58]
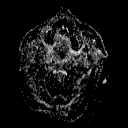
[im 10/58]
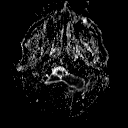
[im 20/58]
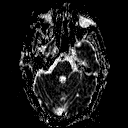
[im 29/58]
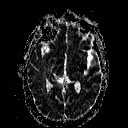
[im 39/58]
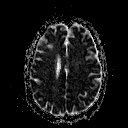
[im 48/58]
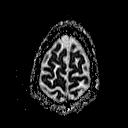
[im 58/58]
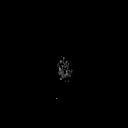

[Series 10: T2 · axial · 5.0mm · 0.45mm/px · z∈[-79,+102]mm · 3 of 29 slices shown (1 of 3)]
[im 1/29]
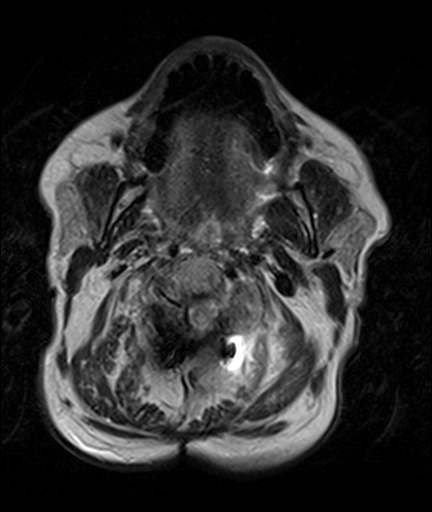
[im 15/29]
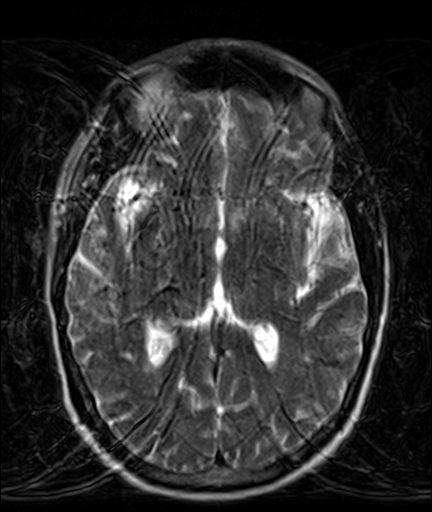
[im 29/29]
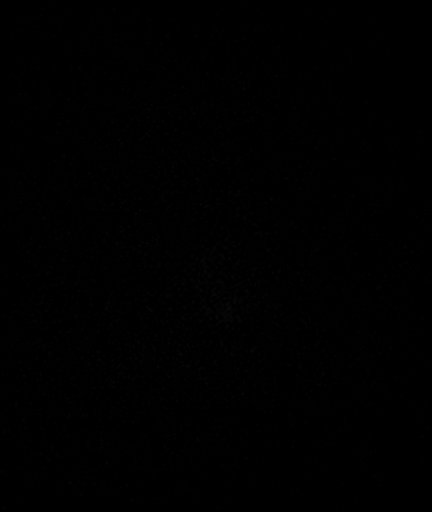

[Series 12: DWI · coronal · 3.0mm · 1.80mm/px · 6 of 53 slices shown (2 of 4)]
[im 1/53]
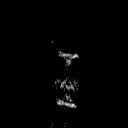
[im 11/53]
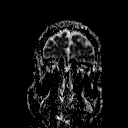
[im 21/53]
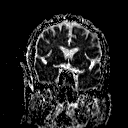
[im 32/53]
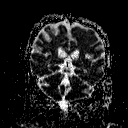
[im 42/53]
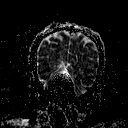
[im 53/53]
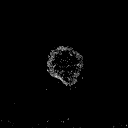

[Series 13: FLAIR · axial · 3.0mm · 0.45mm/px · z∈[-75,+98]mm · 7 of 59 slices shown]
[im 1/59]
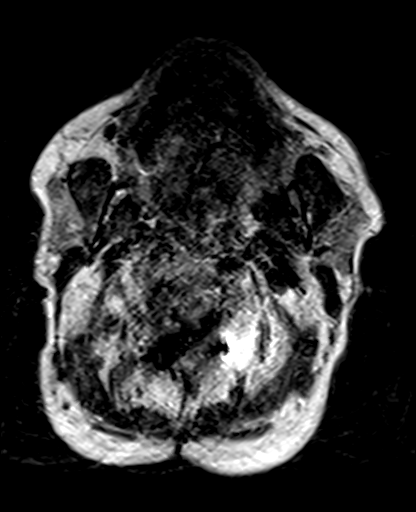
[im 10/59]
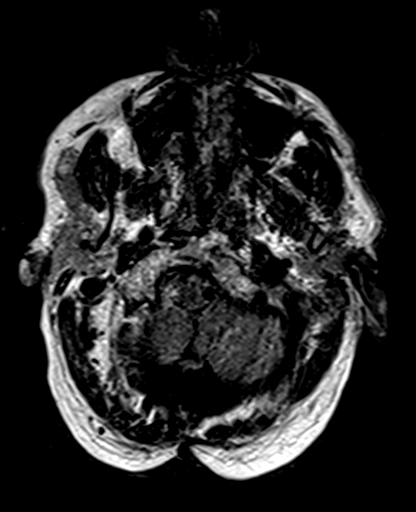
[im 20/59]
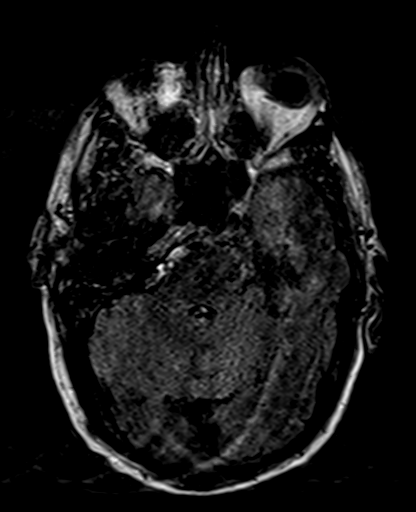
[im 30/59]
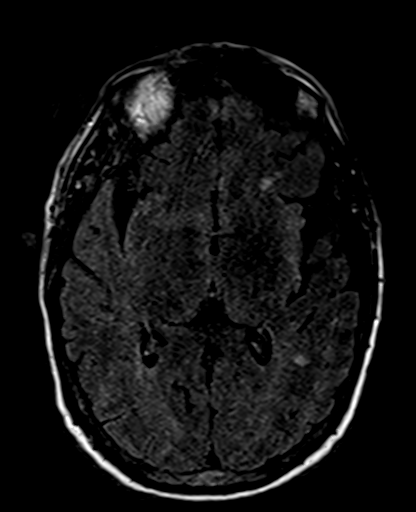
[im 39/59]
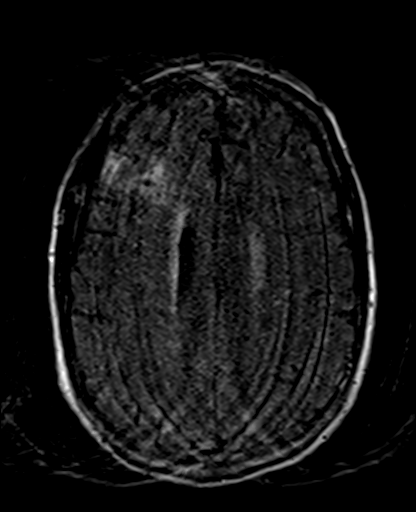
[im 49/59]
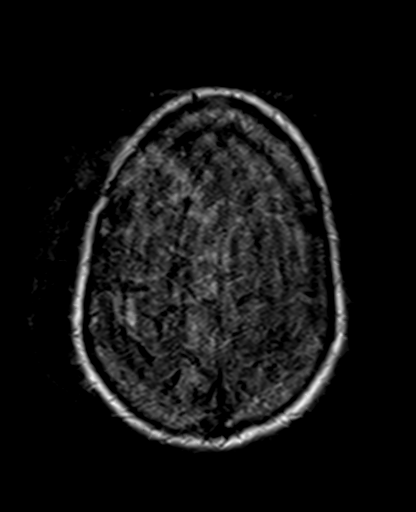
[im 59/59]
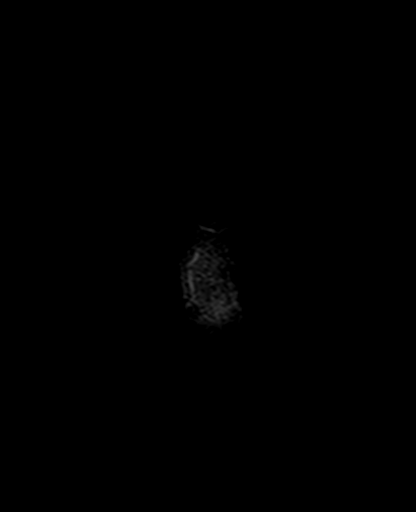

[Series 14: T2 · axial · 5.0mm · 1.20mm/px · z∈[-79,+102]mm · 3 of 29 slices shown (2 of 3)]
[im 1/29]
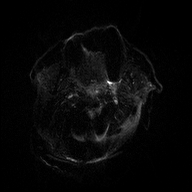
[im 15/29]
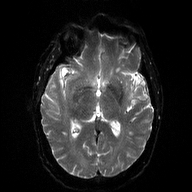
[im 29/29]
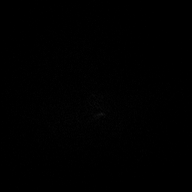

[Series 15: GRE · axial · 5.0mm · 0.45mm/px · z∈[-79,+12]mm · 2 of 29 slices shown (2 of 2)]
[im 1/29]
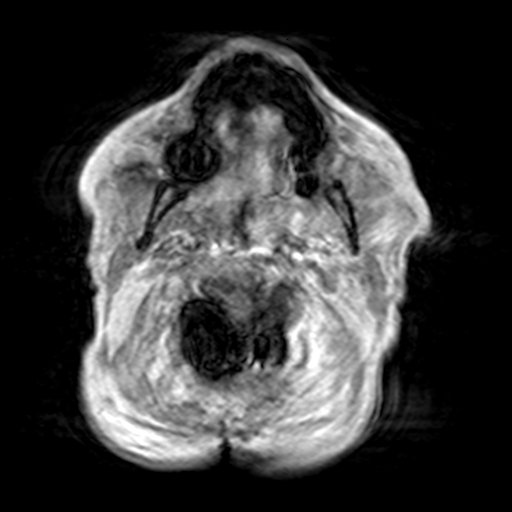
[im 15/29]
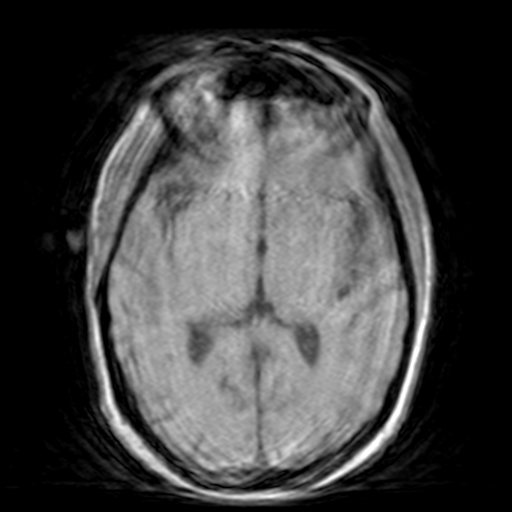

[Series 16: T2 · coronal · 5.0mm · 0.45mm/px · 4 of 33 slices shown (3 of 3)]
[im 1/33]
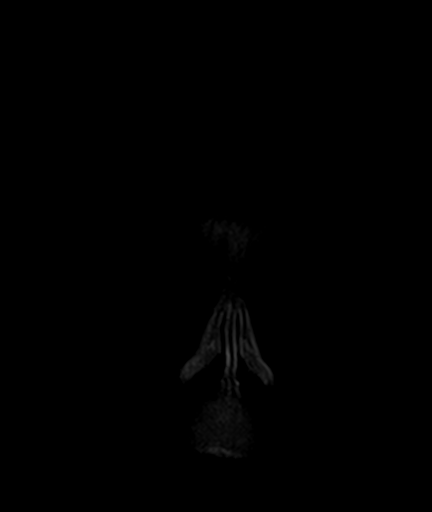
[im 11/33]
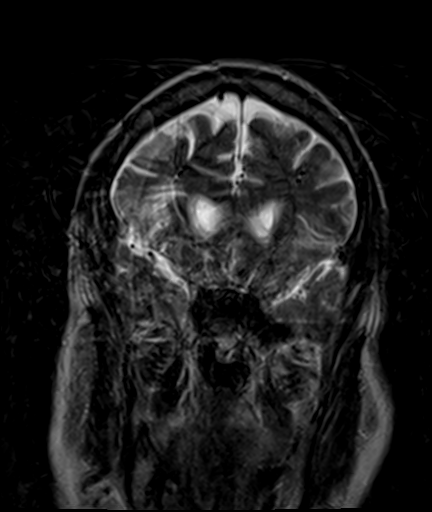
[im 22/33]
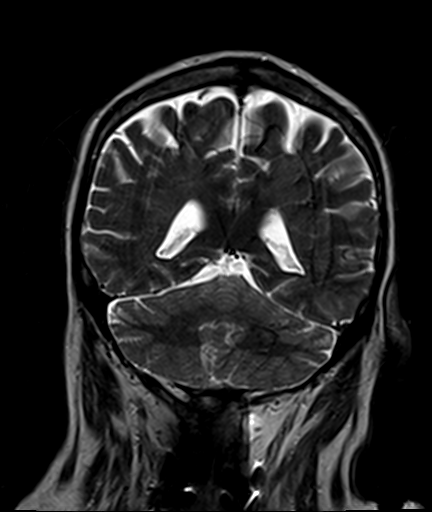
[im 33/33]
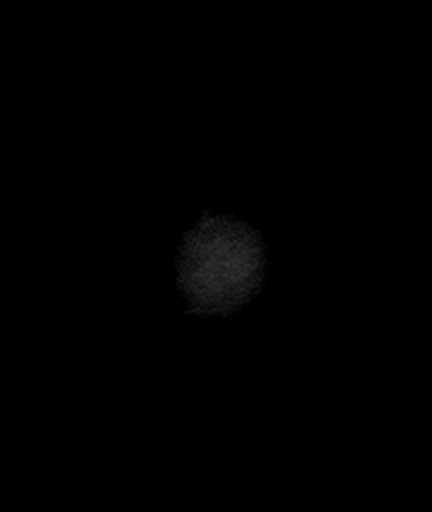

[Series 102: DWI · axial · 3.0mm · 1.80mm/px · z∈[-75,+98]mm · 7 of 58 slices shown (3 of 4)]
[im 1/58]
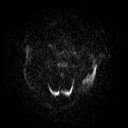
[im 10/58]
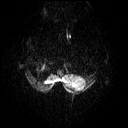
[im 20/58]
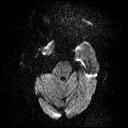
[im 29/58]
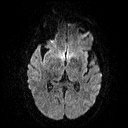
[im 39/58]
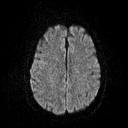
[im 48/58]
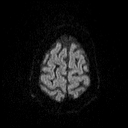
[im 58/58]
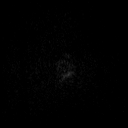

[Series 103: DWI · coronal · 3.0mm · 1.80mm/px · 6 of 52 slices shown (4 of 4)]
[im 1/52]
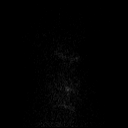
[im 11/52]
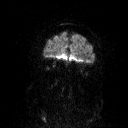
[im 21/52]
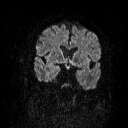
[im 31/52]
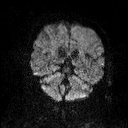
[im 41/52]
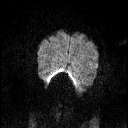
[im 52/52]
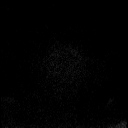

[47 of 48 positions shown; findings below may reference images not displayed]

FINDINGS: Brain: Study fairly degraded by motion artifact.

Diffuse prominence of the CSF containing spaces is compatible with
generalized age-related cerebral atrophy. Mild chronic microvascular
changes present within the periventricular and deep white matter.
Focal encephalomalacia within the anterior right frontal lobe
compatible with remote right MCA territory infarct. Small remote
left cerebellar infarct noted as well.

No abnormal foci of restricted diffusion to suggest acute or
subacute infarct. Gray-white matter differentiation maintained. No
evidence for acute intracranial hemorrhage.

No mass lesion, midline shift or mass effect. No hydrocephalus. No
extra-axial fluid collection.

Vascular: Major intracranial vascular flow voids grossly maintained.
Right vertebral artery diminutive.

Skull and upper cervical spine: Craniocervical junction grossly
unremarkable. Susceptibility artifact from posterior spinal fusion
hardware noted within the upper cervical spine. Bone marrow signal
intensity within normal limits. Small frontal scalp contusion noted
he vertex.

Sinuses/Orbits: Globes and orbital soft tissues within normal
limits. Paranasal sinuses are clear. Small right mastoid effusion
noted.

Other: None.
IMPRESSION: 1. No acute intracranial process identified.
2. Small frontal scalp contusion near the vertex.
3. Remote right MCA and left cerebellar infarcts.
4. Mild chronic microvascular ischemic disease.

## 2018-05-28 IMAGING — DX DG CHEST 1V
1 series · 1 of 1 positions shown · non-contrast
Comparison: 08/19/2016

CLINICAL DATA: Fell earlier tonight.  Hematemesis.

EXAM:
CHEST 1 VIEW

[chest ap]
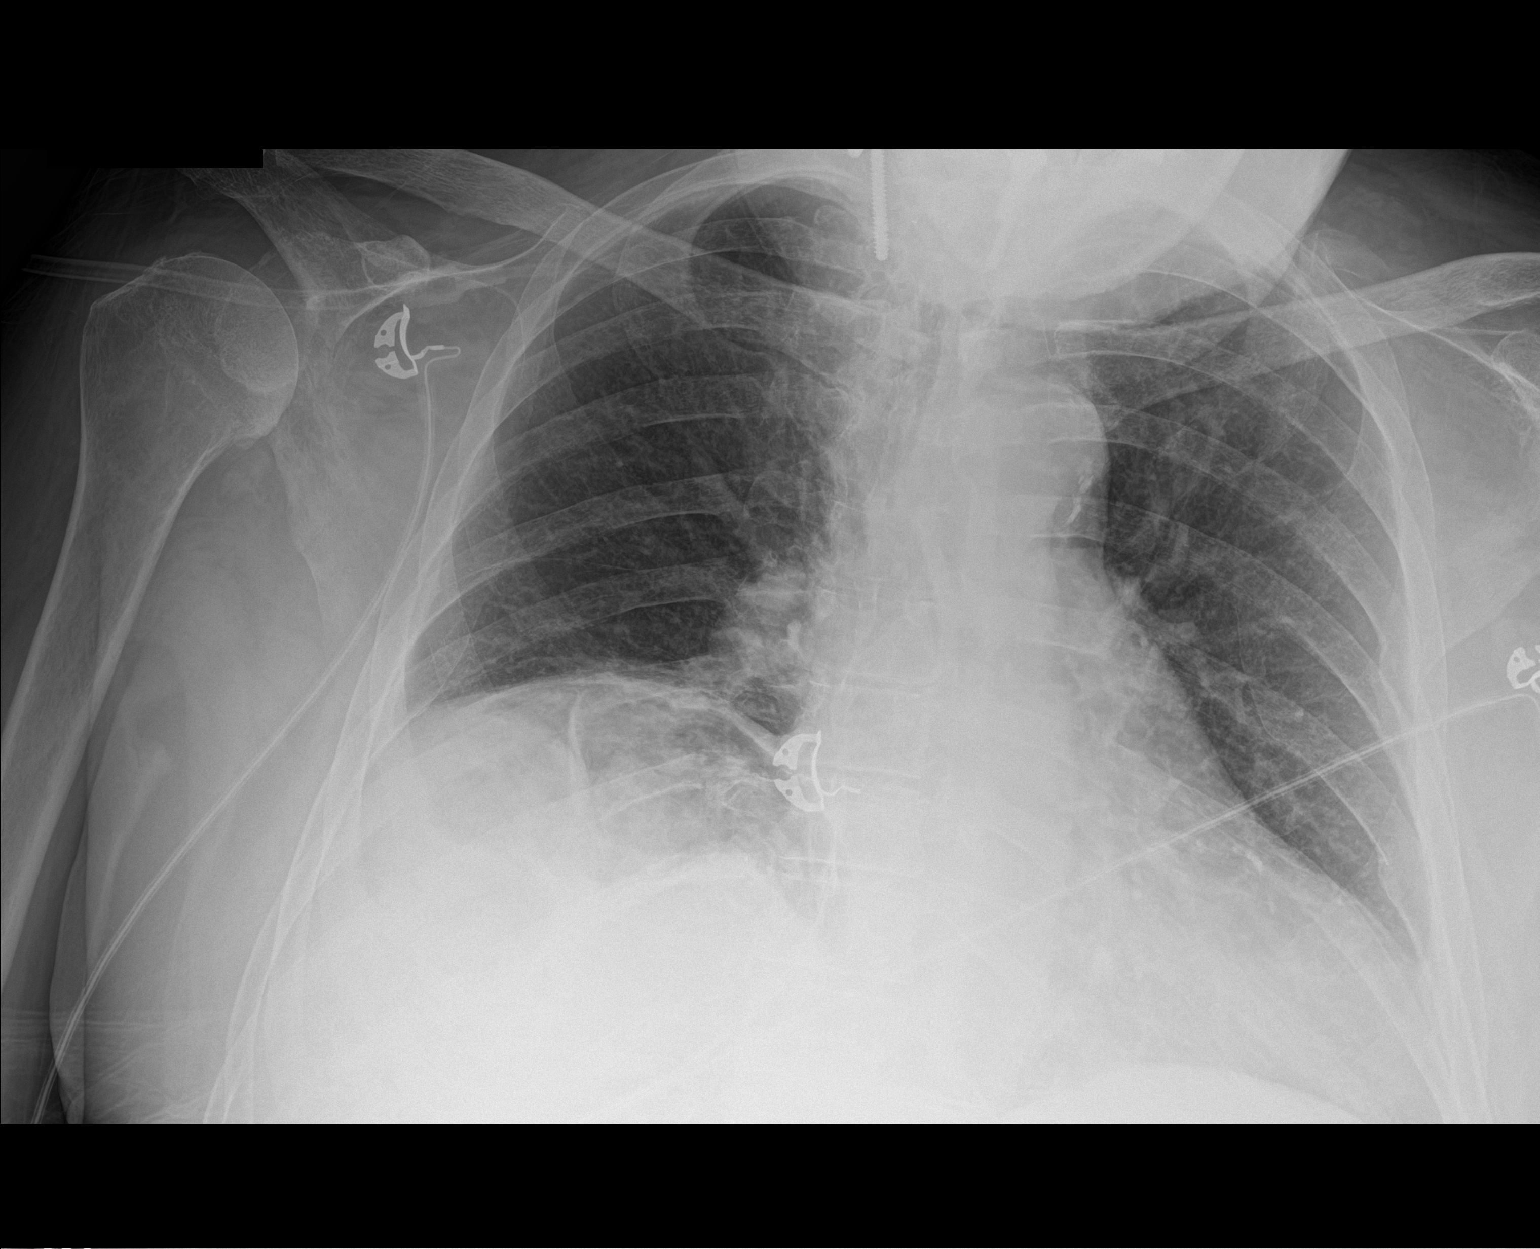

[1 of 1 positions shown; findings below may reference images not displayed]

FINDINGS: Moderate right hemidiaphragm elevation, unchanged. Moderate
cardiomegaly, unchanged. No confluent airspace consolidation. No
large effusion. Normal pulmonary vasculature.

Multiple remote healed fracture deformities of left ribs.
IMPRESSION: Stable cardiomegaly.  No confluent consolidation or large effusion.

## 2018-05-28 IMAGING — CT CT HEAD W/O CM
5 of 17 series · 13 of 47 positions shown, 14 images · non-contrast
Comparison: 09/15/2016 MRI of the brain and cervical spine.
07/16/2016 CT of the head. CT of cervical spine dated 03/03/2016.

CLINICAL DATA: 71 y/o M; fall with hematoma to top of head and
altered mental status.

EXAM:
CT HEAD WITHOUT CONTRAST
CT CERVICAL SPINE WITHOUT CONTRAST
TECHNIQUE: Multidetector CT imaging of the head and cervical spine was
performed following the standard protocol without intravenous
contrast. Multiplanar CT image reconstructions of the cervical spine
were also generated.

[Series 4: head bone · axial · 0.42mm/px · z∈[-56,-4]mm · 2 of 78 slices shown (1 of 2)]
[im 26/78  bone]
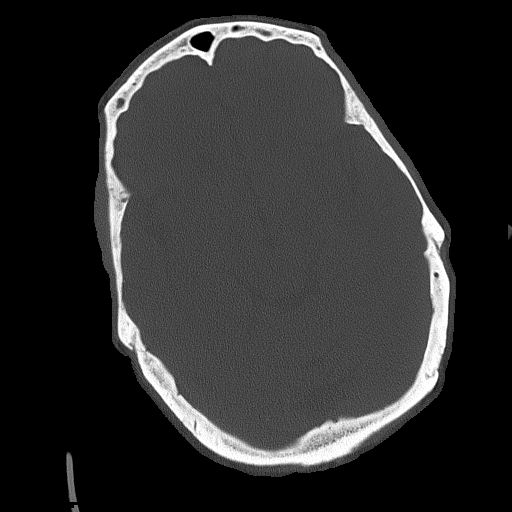
[im 52/78  bone]
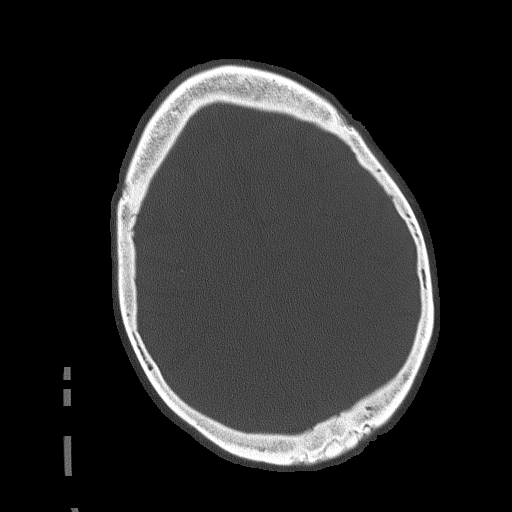

[Series 6: head bone · axial · 0.42mm/px · z∈[-56,-4]mm · 2 of 78 slices shown (2 of 2)]
[im 26/78  bone]
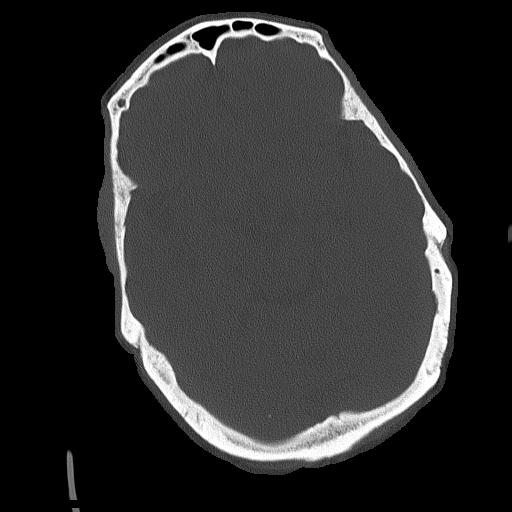
[im 52/78  bone]
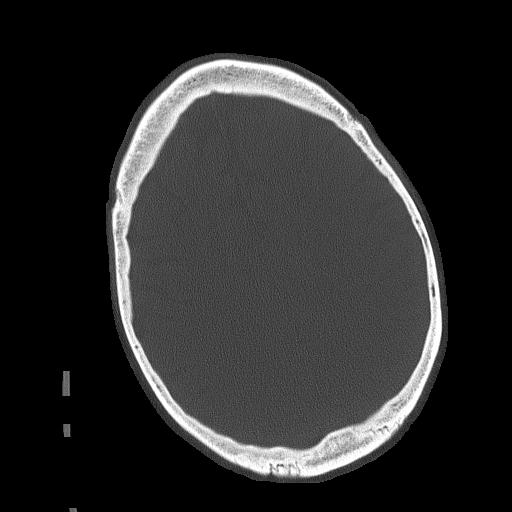

[Series 8: coronal soft tissue · coronal · 0.30mm/px · 3 of 72 slices shown]
[im 24/72  brain]
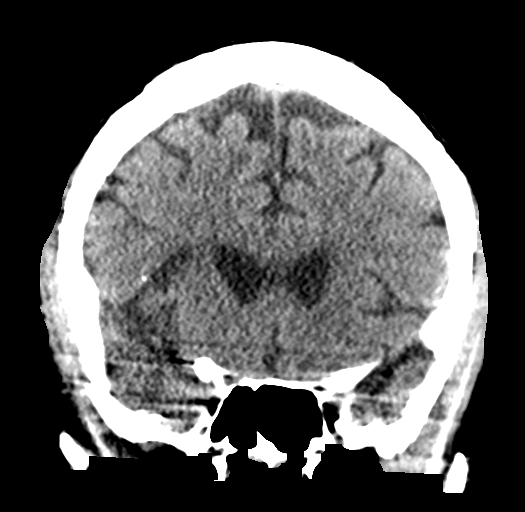
[im 36/72  brain]
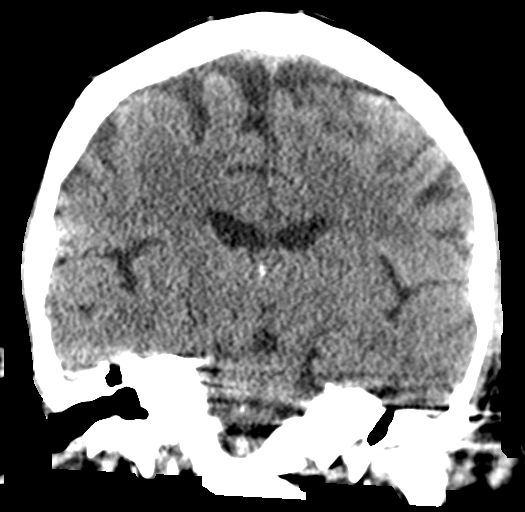
[im 48/72  brain]
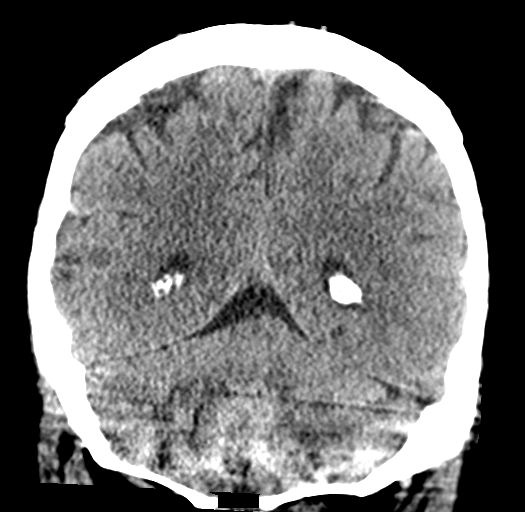

[Series 20: orthogonal bone · axial · 0.19mm/px · z∈[-247,-156]mm · 3 of 114 slices shown, 4 images (1 of 2)]
[im 29/114  brain]
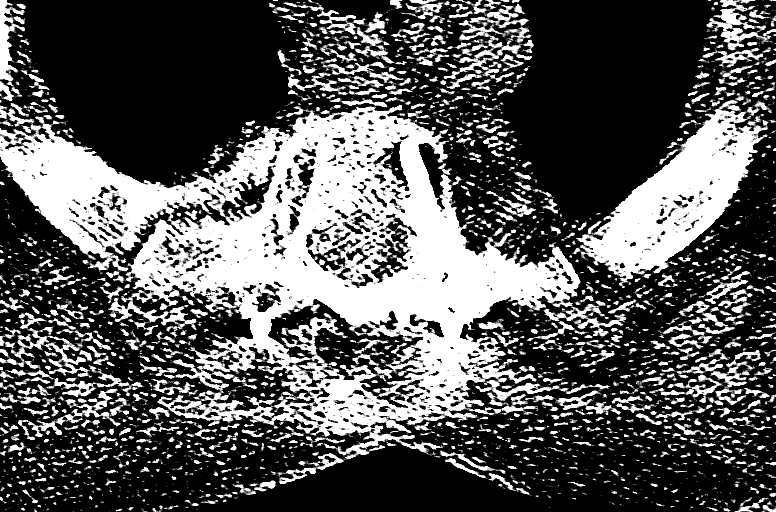
[im 29/114  bone]
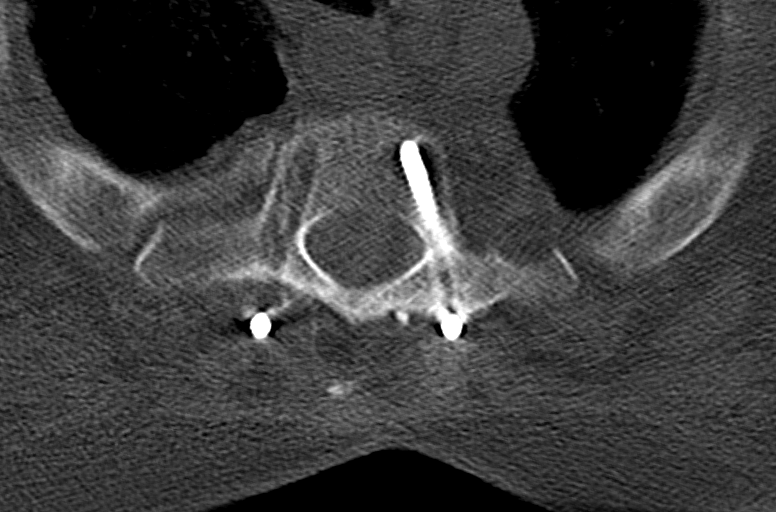
[im 57/114  brain]
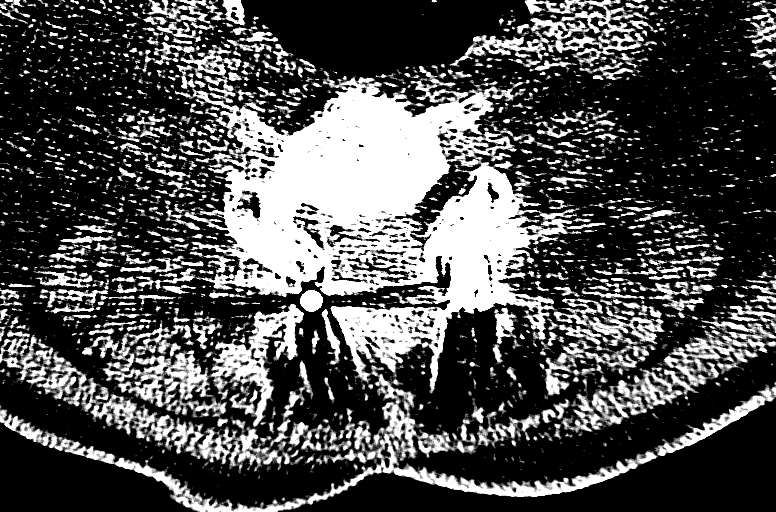
[im 85/114  brain]
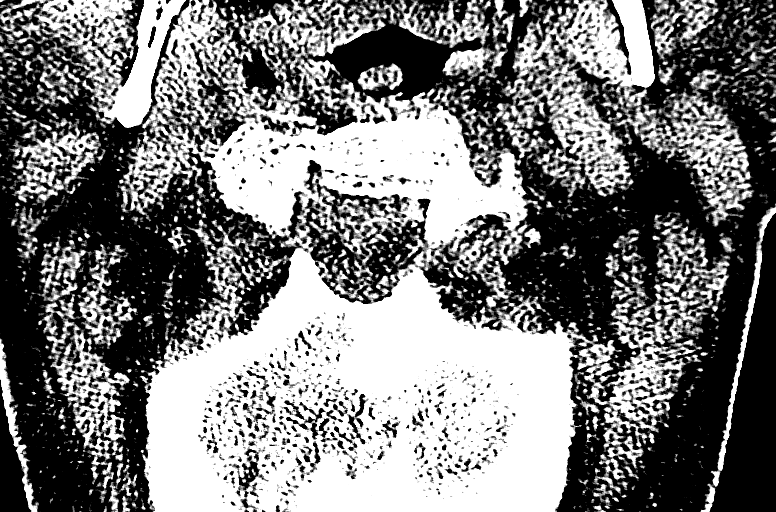

[Series 25: orthogonal bone · axial · 0.19mm/px · z∈[-247,-153]mm · 3 of 114 slices shown (2 of 2)]
[im 29/114  bone]
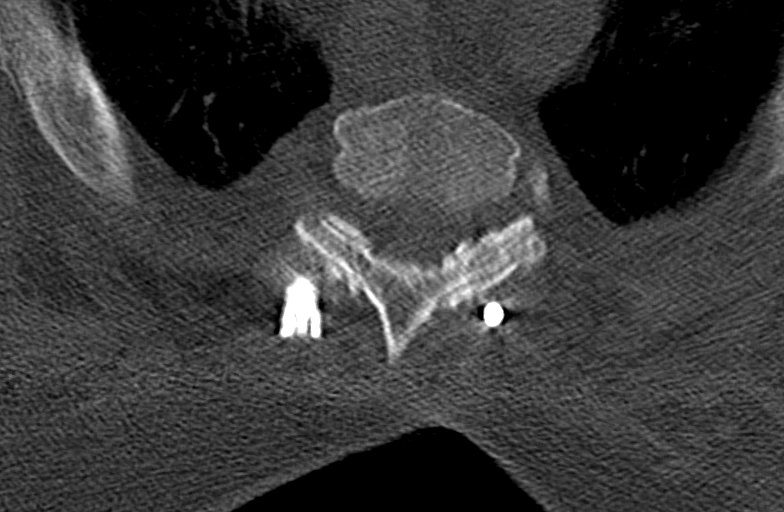
[im 57/114  bone]
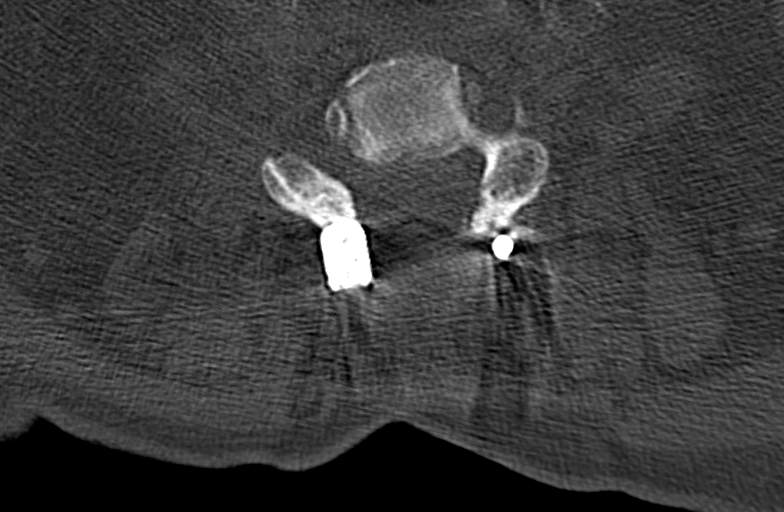
[im 85/114  bone]
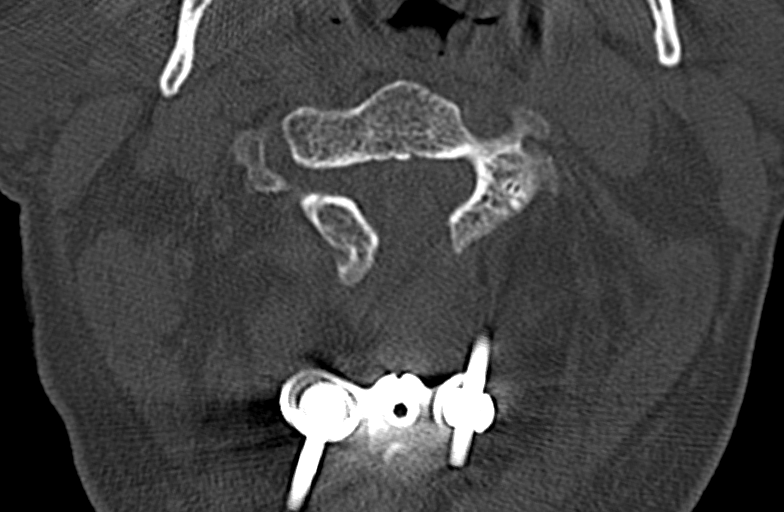

[13 of 47 positions shown; findings below may reference images not displayed]

FINDINGS: CT HEAD FINDINGS

Brain: No evidence of acute infarction, hemorrhage, hydrocephalus,
extra-axial collection or mass lesion/mass effect. Small left
cerebellar chronic lacunar infarct and small right frontal cortical
infarct are stable. Scattered punctate calcifications are present
over the right cerebral convexity stable from prior study probably
representing sequelae of prior infectious or inflammatory process.

Vascular: Extensive calcific atherosclerosis of the cavernous and
paraclinoid internal carotid arteries.

Skull: Scalp soft tissue thickening of the superior frontal region
compatible with contusion. No displaced calvarial fracture.

CT CERVICAL SPINE FINDINGS

Alignment: Stable grade 1 C2-3 anterolisthesis and mild reversal of
curvature apex at C3-4.

Skull base and vertebrae:  No acute fracture identified.

Soft tissues and spinal canal: Extensive calcific atherosclerosis of
the carotid bifurcations bilaterally.

Disc levels: Occipital the T2 posterior instrumented fusion fixed by
multiple facet screws. There is no apparent hardware related
complication. There is C2 through C5 facet effusion bilaterally.
There is prominent productive degenerative changes of the upper
cervical spine most pronounced at the C3 through C5 levels.
Posterior decompressive changes are present at the C1 through C6
levels. Incomplete atlantooccipital assimilation may be congenital
or traumatic.

Upper chest: Negative.

Other: Negative appear
IMPRESSION: 1. No acute intracranial abnormality identified.
2. Small scalp contusion in the superior frontal region. No
displaced calvarial fracture.
3. Small stable lacunar left cerebellar chronic infarction and right
frontal small cortical chronic infarction.
4. No acute fracture of the cervical spine.
5. Stable postsurgical changes related to occipital to T2 fusion, C1
the C6 posterior decompression. No apparent hardware related
complication.
6. Stable advanced cervical spondylosis greatest at the C3 through
C5 levels.

By: Kanesha Compres M.D.

## 2018-05-30 IMAGING — US US EXTREM LOW VENOUS BILAT
1 series · 13 of 24 positions shown · non-contrast
Comparison: 04/19/2011

CLINICAL DATA: Cellulitis



[Series 1: us extrem low venous bilat · 0.10mm/px · 13 of 52 slices shown]
[im 1/52]
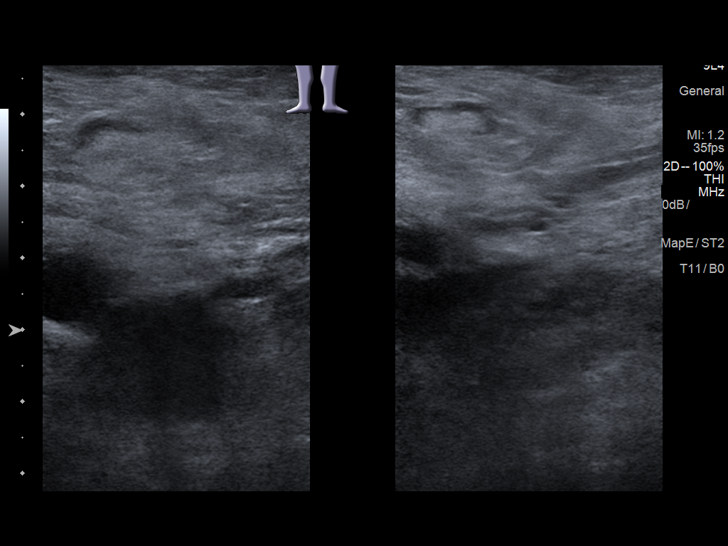
[im 5/52]
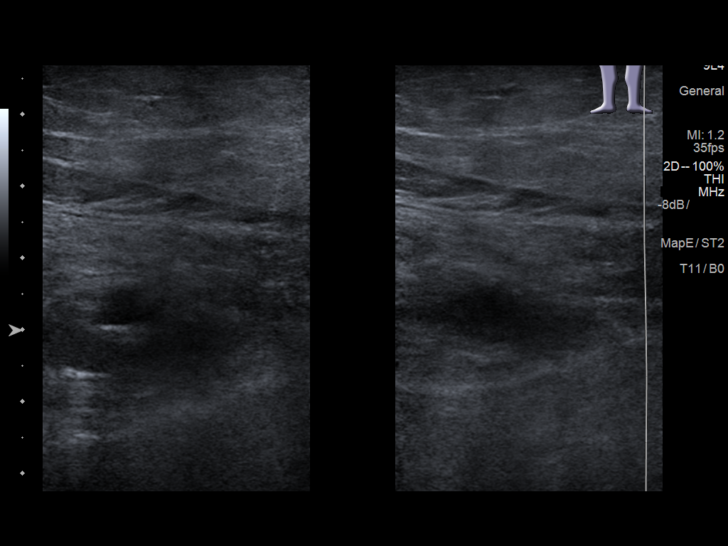
[im 9/52]
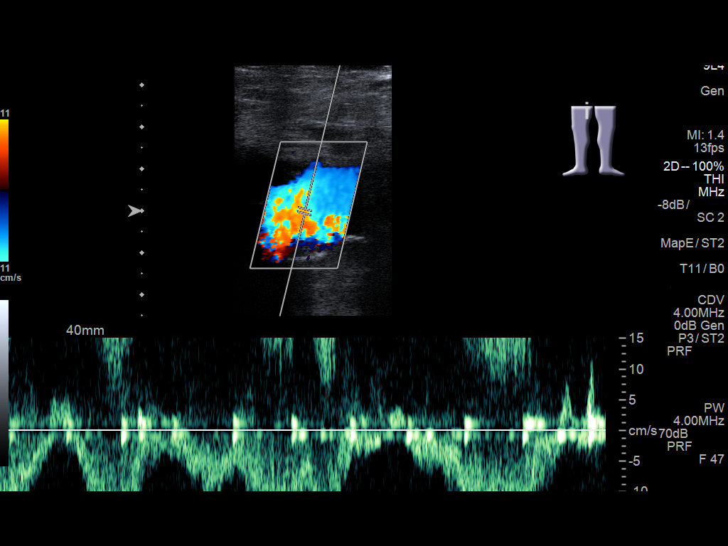
[im 14/52]
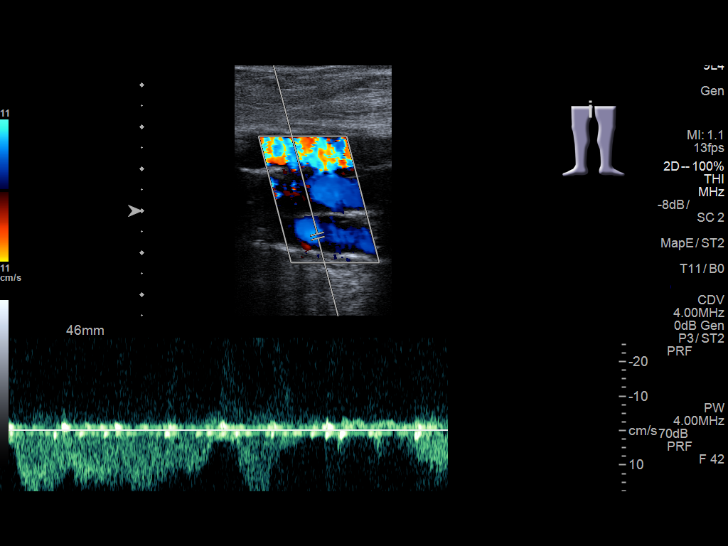
[im 18/52]
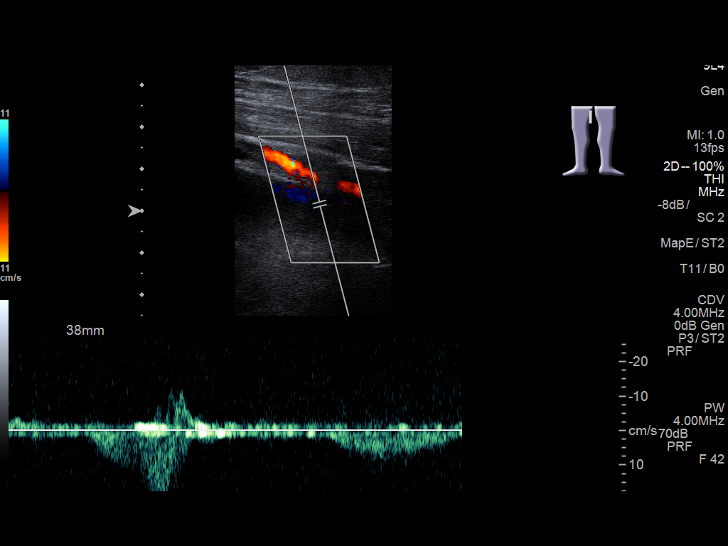
[im 23/52]
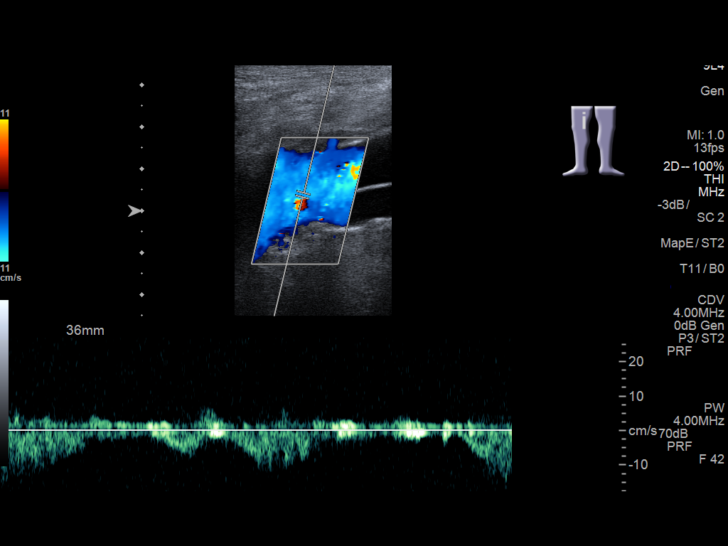
[im 27/52]
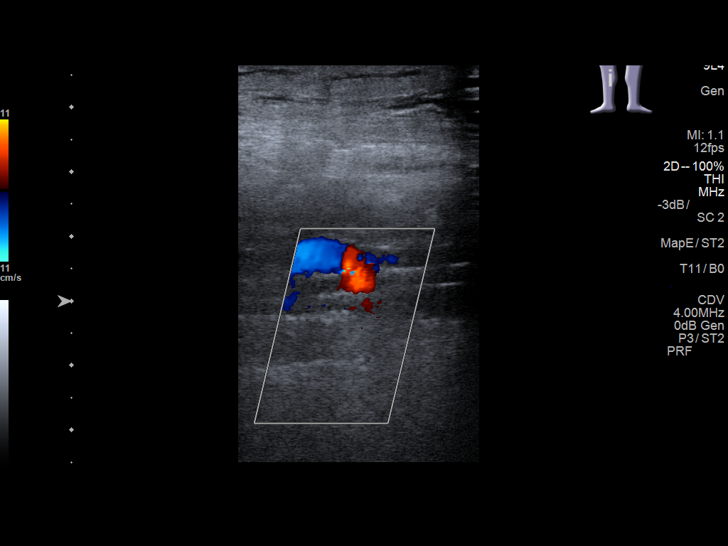
[im 29/52]
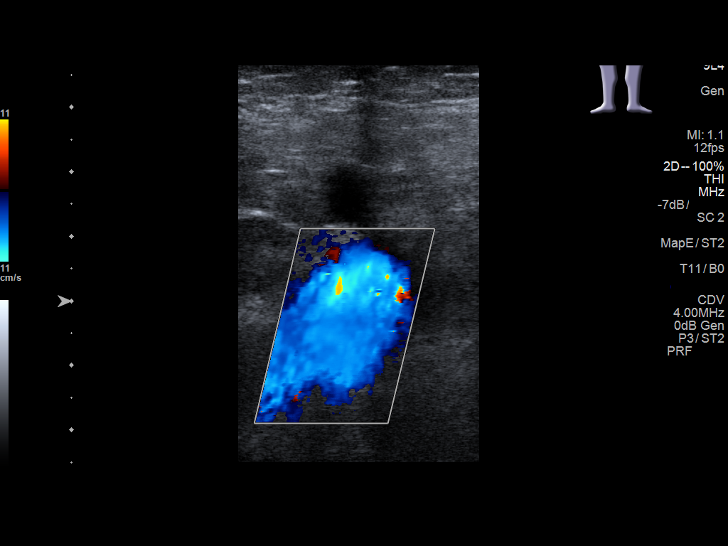
[im 34/52]
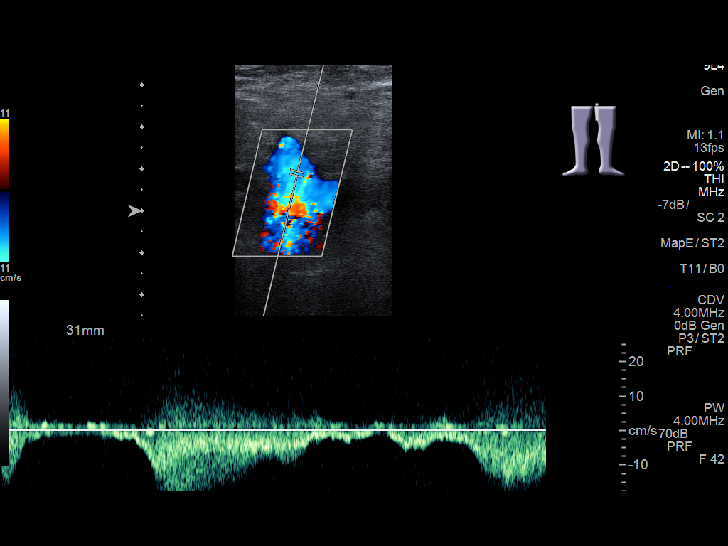
[im 38/52]
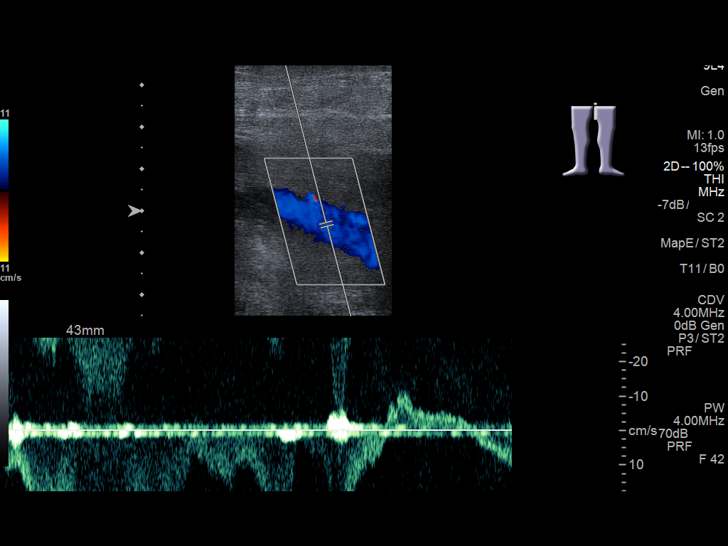
[im 43/52]
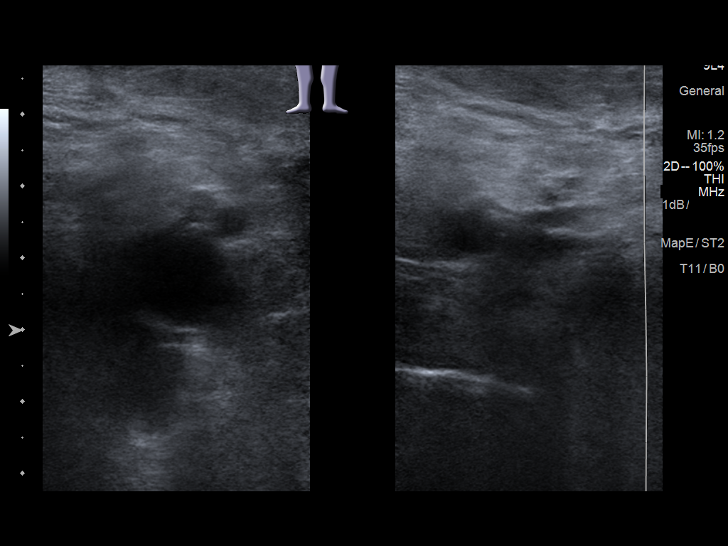
[im 47/52]
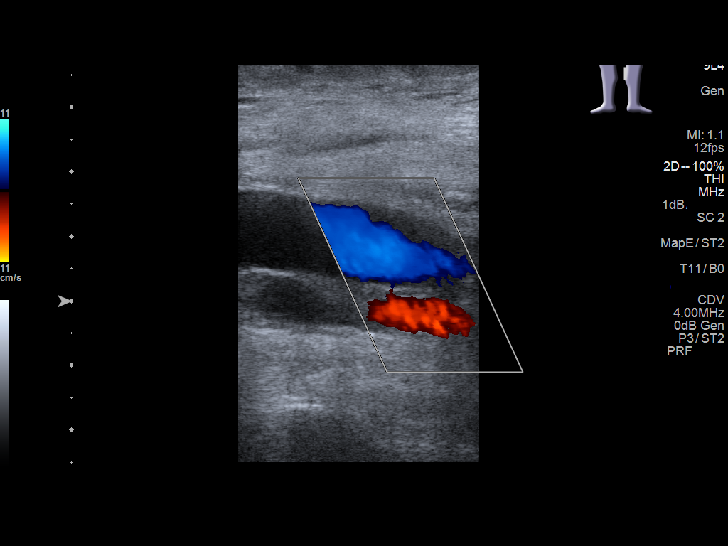
[im 52/52]
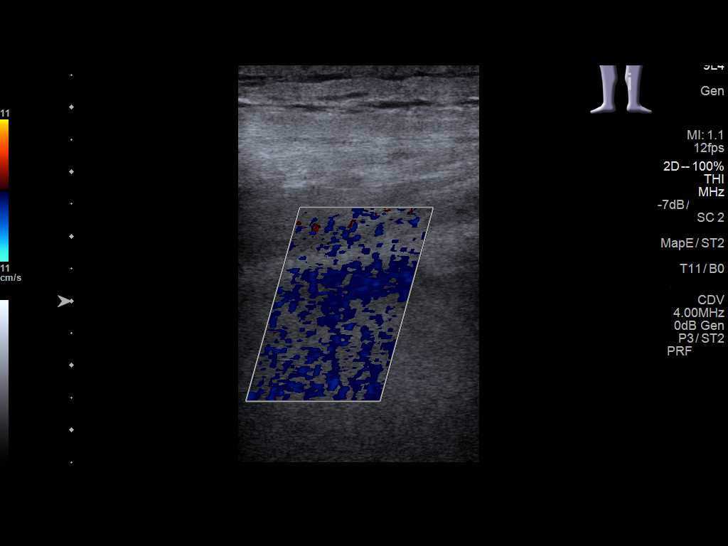

[13 of 24 positions shown; findings below may reference images not displayed]

FINDINGS: RIGHT LOWER EXTREMITY

Common Femoral Vein: No evidence of thrombus. Normal
compressibility, respiratory phasicity and response to augmentation.

Saphenofemoral Junction: No evidence of thrombus. Normal
compressibility and flow on color Doppler imaging.

Profunda Femoral Vein: No evidence of thrombus. Normal
compressibility and flow on color Doppler imaging.

Femoral Vein: No evidence of thrombus. Normal compressibility,
respiratory phasicity and response to augmentation.

Popliteal Vein: No evidence of thrombus. Normal compressibility,
respiratory phasicity and response to augmentation.

Calf Veins: No evidence of thrombus. Normal compressibility and flow
on color Doppler imaging.

Superficial Great Saphenous Vein: No evidence of thrombus. Normal
compressibility.

Venous Reflux:  None.

Other Findings:  Scattered subcutaneous edema

LEFT LOWER EXTREMITY

Common Femoral Vein: No evidence of thrombus. Normal
compressibility, respiratory phasicity and response to augmentation.

Saphenofemoral Junction: No evidence of thrombus. Normal
compressibility and flow on color Doppler imaging.

Profunda Femoral Vein: No evidence of thrombus. Normal
compressibility and flow on color Doppler imaging.

Femoral Vein: No evidence of thrombus. Normal compressibility,
respiratory phasicity and response to augmentation.

Popliteal Vein: No evidence of thrombus. Normal compressibility,
respiratory phasicity and response to augmentation.

Calf Veins: No evidence of thrombus. Normal compressibility and flow
on color Doppler imaging.

Superficial Great Saphenous Vein: No evidence of thrombus. Normal
compressibility.

Venous Reflux:  None.

Other Findings:  Scattered subcutaneous edema.
IMPRESSION: No evidence of deep venous thrombosis in either lower extremity.

## 2018-06-21 DEATH — deceased

## 2018-06-29 ENCOUNTER — Ambulatory Visit: Payer: Medicare Other | Admitting: Family

## 2018-06-29 ENCOUNTER — Encounter (HOSPITAL_COMMUNITY): Payer: Medicare Other

## 2018-06-29 ENCOUNTER — Encounter: Payer: Self-pay | Admitting: Family

## 2018-06-29 ENCOUNTER — Ambulatory Visit (HOSPITAL_COMMUNITY): Admission: RE | Admit: 2018-06-29 | Payer: 59 | Source: Ambulatory Visit

## 2018-09-21 ENCOUNTER — Ambulatory Visit: Payer: Medicare Other | Admitting: Family

## 2018-10-25 IMAGING — MR MR CERVICAL SPINE WO/W CM
8 of 18 series · 19 of 48 positions shown · IV contrast (multihance)
Comparison: Cervical spine CT 09/26/2016.

CLINICAL DATA: 71-year-old male status post fall 3 months ago with
severe bruising, swelling, pain radiating to the right shoulder and
leg.

EXAM:
MRI CERVICAL AND THORACIC SPINE WITHOUT AND WITH CONTRAST
TECHNIQUE: Multiplanar and multiecho pulse sequences of the cervical spine, to
include the craniocervical junction and cervicothoracic junction,
and thoracic spine, were obtained without and with intravenous
contrast.
CONTRAST:  20mL MULTIHANCE GADOBENATE DIMEGLUMINE 529 MG/ML IV SOLN

[Series 5: T1 · sagittal · 3.0mm · 0.41mm/px · 2 of 15 slices shown (1 of 2)]
[im 1/15]
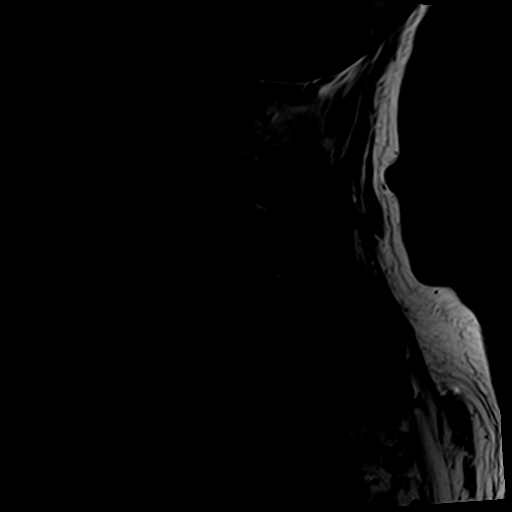
[im 15/15]
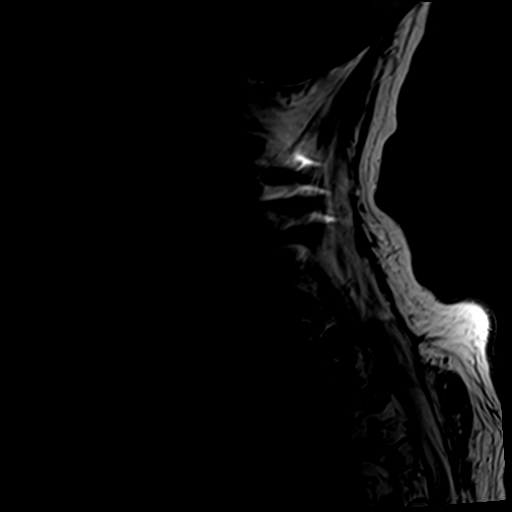

[Series 6: T2 · sagittal · 3.0mm · 0.66mm/px · 1 of 15 slices shown (1 of 4)]
[im 1/15]
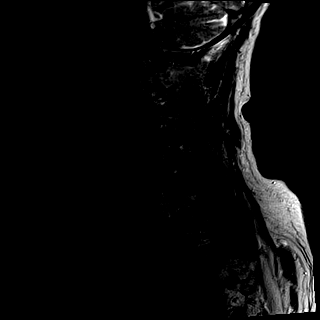

[Series 8: T2 · axial · 3.0mm · 0.70mm/px · z∈[-129,+8]mm · 4 of 40 slices shown (2 of 4)]
[im 1/40]
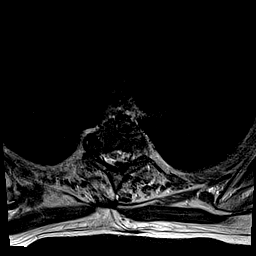
[im 14/40]
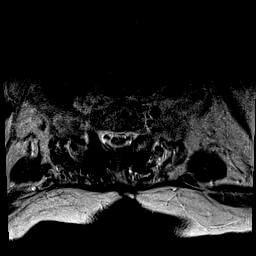
[im 27/40]
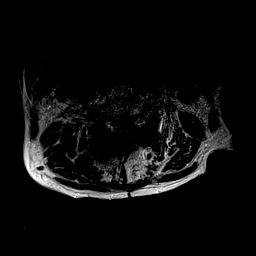
[im 40/40]
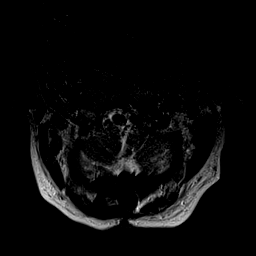

[Series 9: T1 · axial · 3.0mm · 0.35mm/px · z∈[-129,+8]mm · 4 of 40 slices shown (2 of 2)]
[im 1/40]
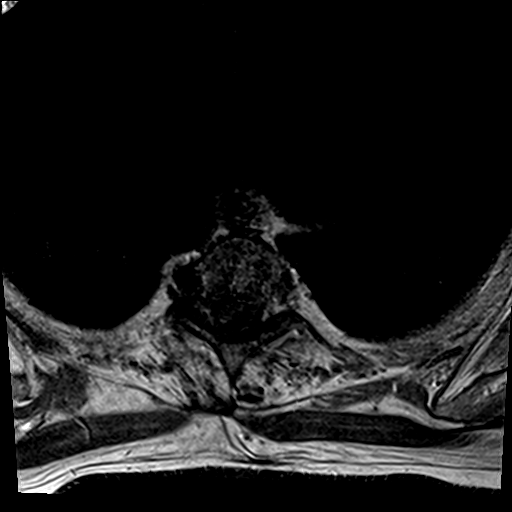
[im 14/40]
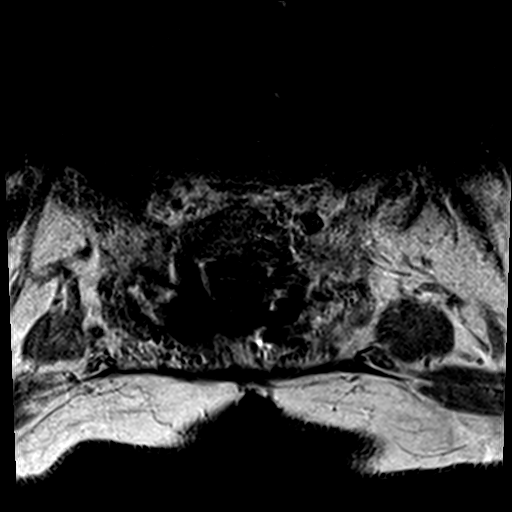
[im 27/40]
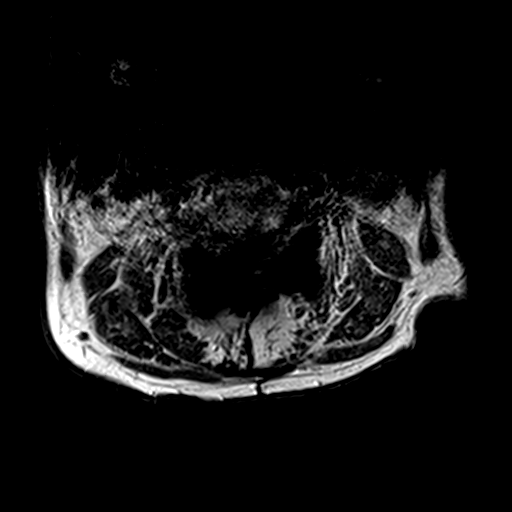
[im 40/40]
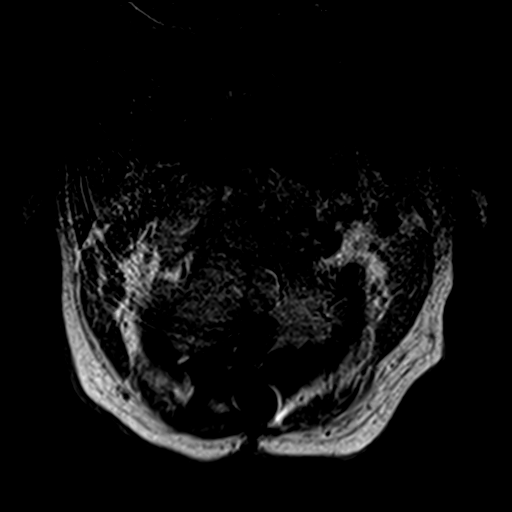

[Series 14: T2 · sagittal · 3.0mm · 0.55mm/px · 1 of 13 slices shown (3 of 4)]
[im 1/13]
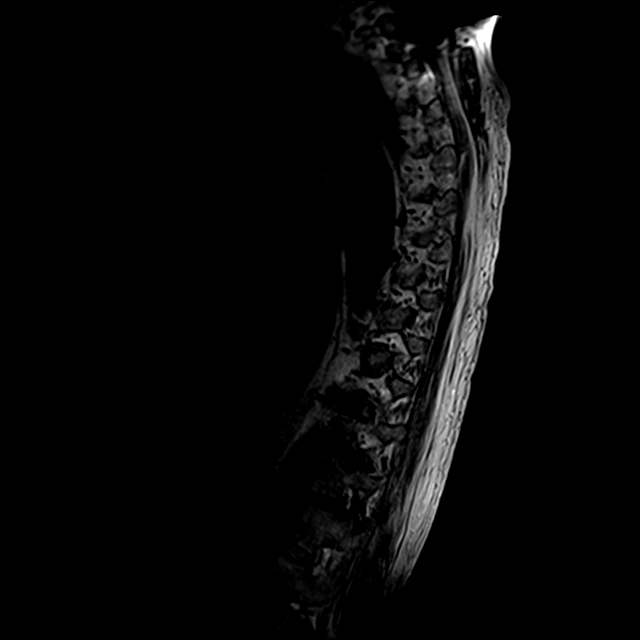

[Series 15: T2 · axial · 4.0mm · 0.39mm/px · z∈[-333,-70]mm · 5 of 47 slices shown (4 of 4)]
[im 1/47]
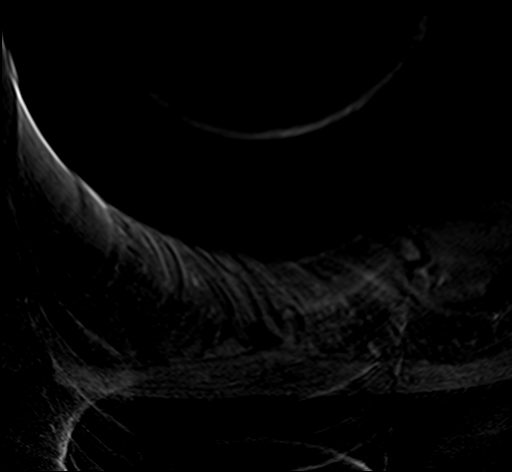
[im 12/47]
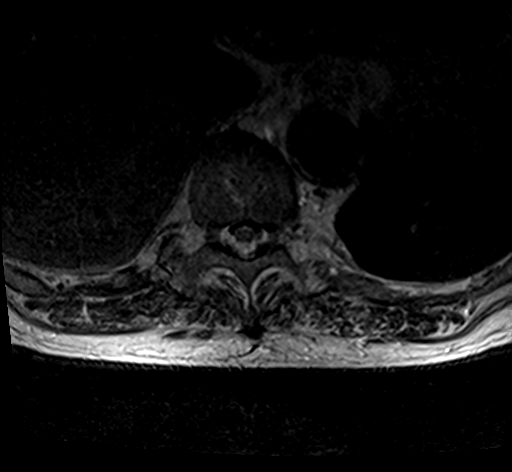
[im 24/47]
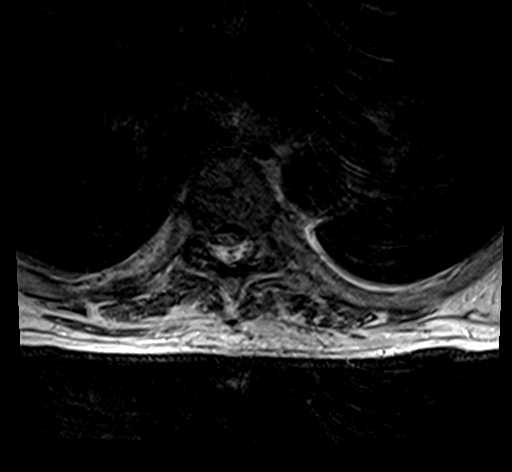
[im 35/47]
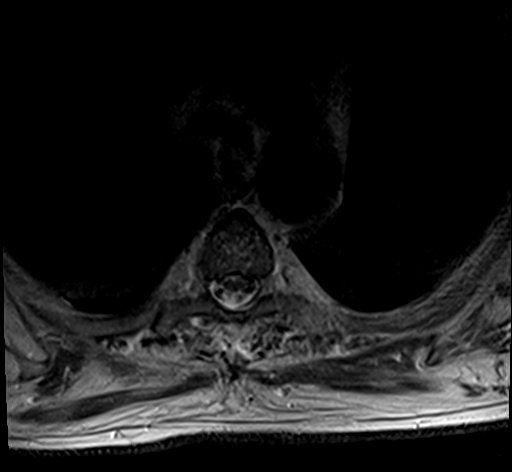
[im 47/47]
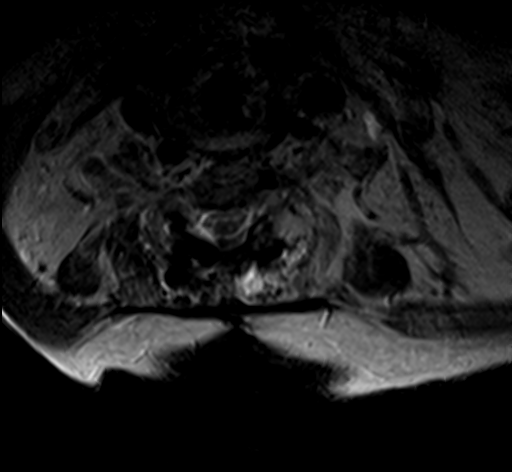

[Series 18: T2 post-contrast · sagittal · 3.0mm · 0.55mm/px · 1 of 13 slices shown (1 of 2)]
[im 1/13]
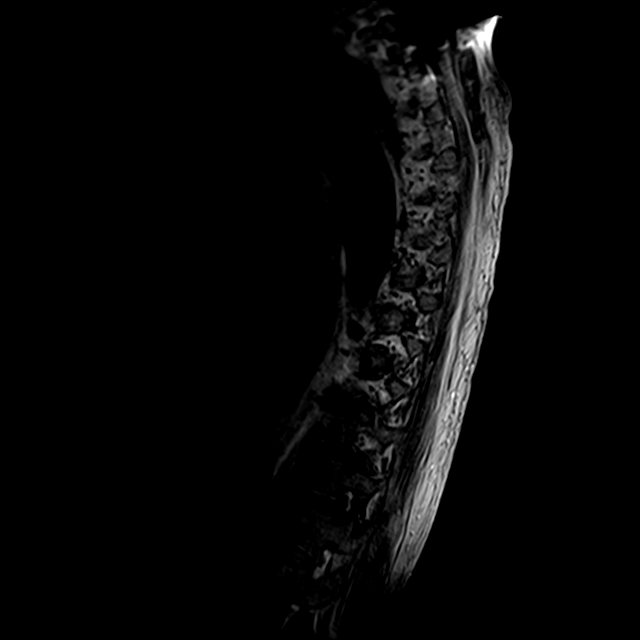

[Series 21: T2 post-contrast · sagittal · 3.0mm · 0.66mm/px · 1 of 15 slices shown (2 of 2)]
[im 1/15]
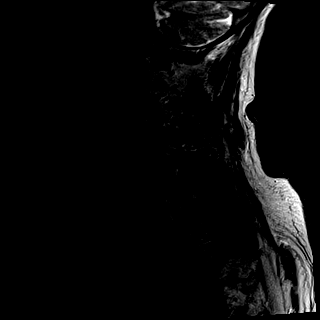

[19 of 48 positions shown; findings below may reference images not displayed]

Cervical spine MRI
09/15/2016. Brain MRI 09/26/2016 CTA head and neck 08/22/2016. Chest
CT 03/23/2016. And earlier.
FINDINGS: MRI CERVICAL SPINE FINDINGS

Chronic occiput to T2 level posterior spinal fusion hardware, best
seen recently on the 6485 CTA, but also partially visible on the
9856 chest CT. Associated posterior cervical and upper thoracic
spine hardware susceptibility artifact.

Alignment: Chronic straightening of the cervical spine.
Cervicothoracic junction alignment is within normal limits.

Vertebrae: Chronic ankylosis of C1 and C2. Suspect chronic ankylosis
of C3 and C4. Visualized bone marrow signal is within normal limits.
No marrow edema or evidence of acute osseous abnormality.

Cord: Hardware artifact limits detail of the cervical spinal cord as
on the prior cervical MRI. Visible cord signal appears stable to the
August 2016 MRI, with no definite myelomalacia. No abnormal
intradural enhancement.

Posterior Fossa, vertebral arteries, paraspinal tissues: Dominant
left vertebral artery again noted. Stable postoperative changes to
the posterior paraspinal soft tissues. Stable visualized brain
parenchyma, hardware artifact continues along the posterior occiput.

Disc levels:

Endplate and facet hypertrophy in the upper cervical spine resulting
in osseous neural foraminal stenosis, but prior posterior
decompression of the cervical spine down to the C6 level. The disc
spaces C5-C6 and C6-C7 are normal.

C7-T1: Negative disc. Mild to moderate facet hypertrophy. No
stenosis.

MRI THORACIC SPINE FINDINGS

Thoracic spine segmentation:  Normal on the 9856 chest CT.

Alignment: Stable since 9856 with mild exaggeration of upper
thoracic kyphosis.

Vertebrae: Mild hardware susceptibility artifact at T1 and T2. No
marrow edema or evidence of acute osseous abnormality. Visualized
bone marrow signal is within normal limits.

Cord: Limited spinal cord detail on T2 and STIR [facility]] the
cervicothoracic junction due to hardware artifact, but the T1 cord
appearance there is normal. From T3 inferiorly the spinal cord
signal is within normal limits. The conus medullaris however is not
evaluated.

Paraspinal and other soft tissues: Susceptibility artifact in the
upper abdomen. Negative visualized thoracic and upper abdominal
viscera. Postoperative changes to the upper thoracic posterior
paraspinal soft tissues. Other thoracic paraspinal soft tissues
appear normal.

Disc levels:

Multilevel upper thoracic facet hypertrophy without spinal stenosis.

No upper thoracic disc degeneration.

There is a subtle central disc protrusion at T7-T8. Superimposed
moderate facet hypertrophy, but no spinal stenosis. There is
moderate left and mild right T7 foraminal stenosis.

T8-T9: Small central disc protrusion. Mild to moderate facet
hypertrophy greater on the right. No spinal stenosis. Mild right T8
foraminal stenosis.

T9-T10: Small central disc protrusion. Moderate bilateral facet
hypertrophy. No spinal stenosis. Mild left and mild-to-moderate
right T9 foraminal stenosis.

T10-T11: Mild spinal stenosis related mostly to moderate to severe
facet and ligament flavum hypertrophy. There is mild endplate
spurring. See series 15, image 42). There is mild to moderate
bilateral T10 foraminal stenosis.

T11-12: Disc bulge and moderate to severe facet and ligament flavum
hypertrophy. Borderline to mild spinal stenosis. Mild to moderate
bilateral T11 foraminal stenosis.

T12-L1:  Appears negative.
IMPRESSION: 1. Prior occiput to T2 level posterior fusion. Associated hardware
susceptibility artifact.
2. Stable postoperative appearance of the cervical spine since the
MRI in [REDACTED]. No cervical spinal or acute foraminal
stenosis.
3. Thoracic spine degeneration mostly affecting the posterior
elements. Superimposed small disc protrusions or disc bulging from
T7-T8 inferiorly.
4. Subsequent mild thoracic spinal stenosis at T10-T11 and T11-T12.
Up to moderate thoracic neural foraminal stenosis including the left
T7 and bilateral T10 nerve levels.

## 2018-11-12 IMAGING — DX DG WRIST COMPLETE 3+V*L*
4 series · 4 of 4 positions shown · non-contrast
Comparison: Left forearm 03/03/2016.

CLINICAL DATA: Left wrist swelling beginning last night. No injury.
Extra finger removed as infant.

EXAM:
LEFT WRIST - COMPLETE 3+ VIEW

[wrist ap (1 of 2)]
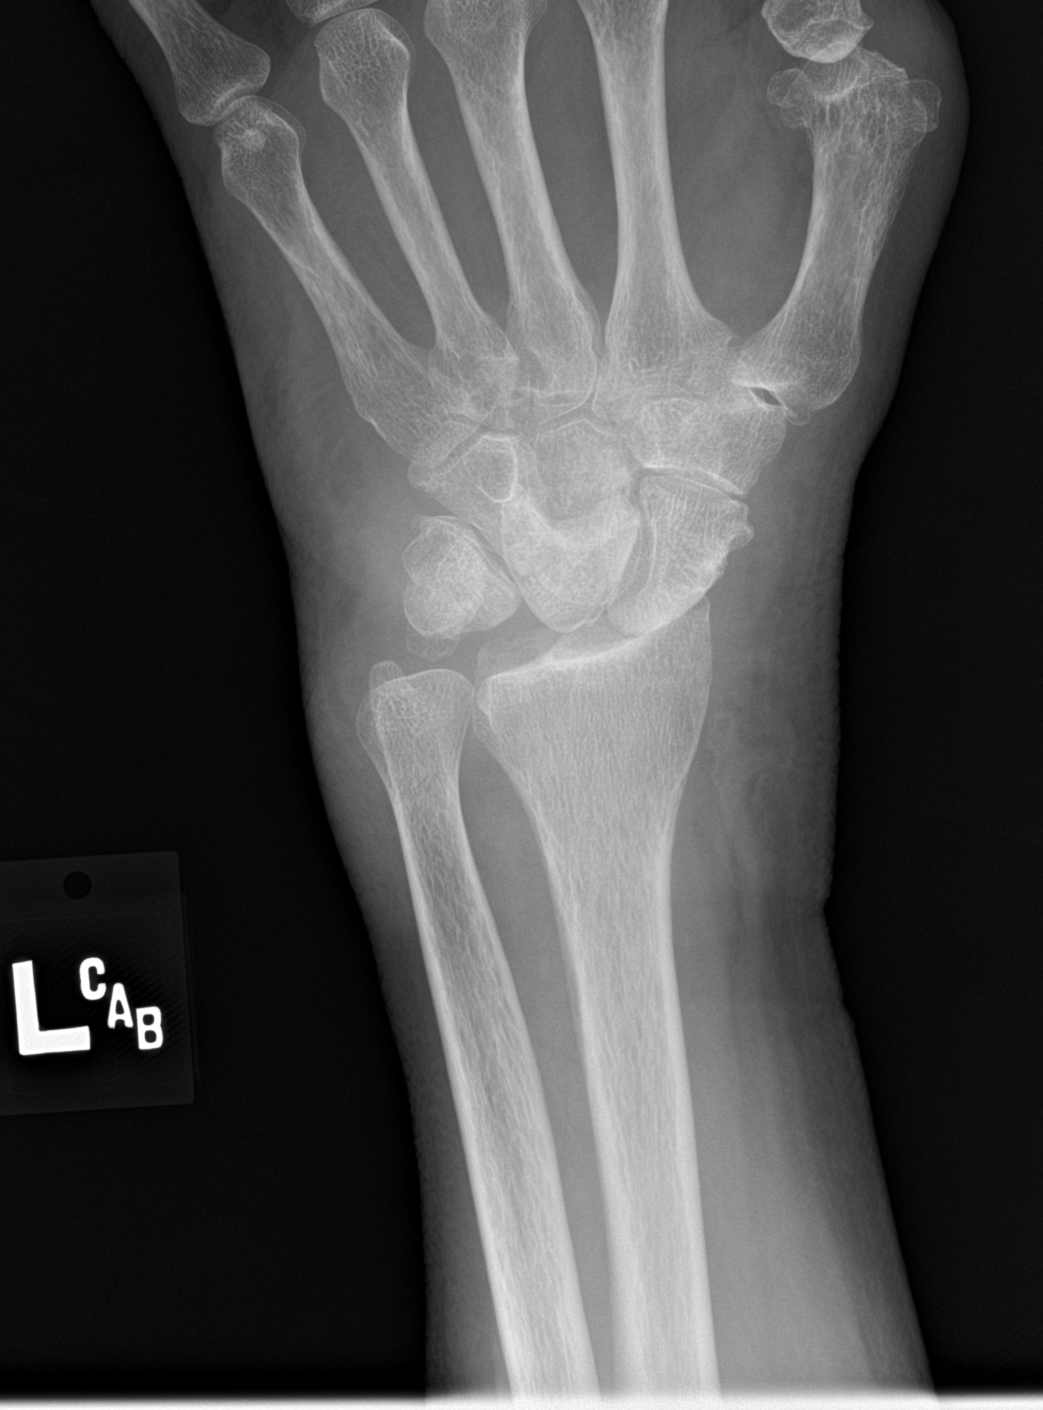

[wrist obl]
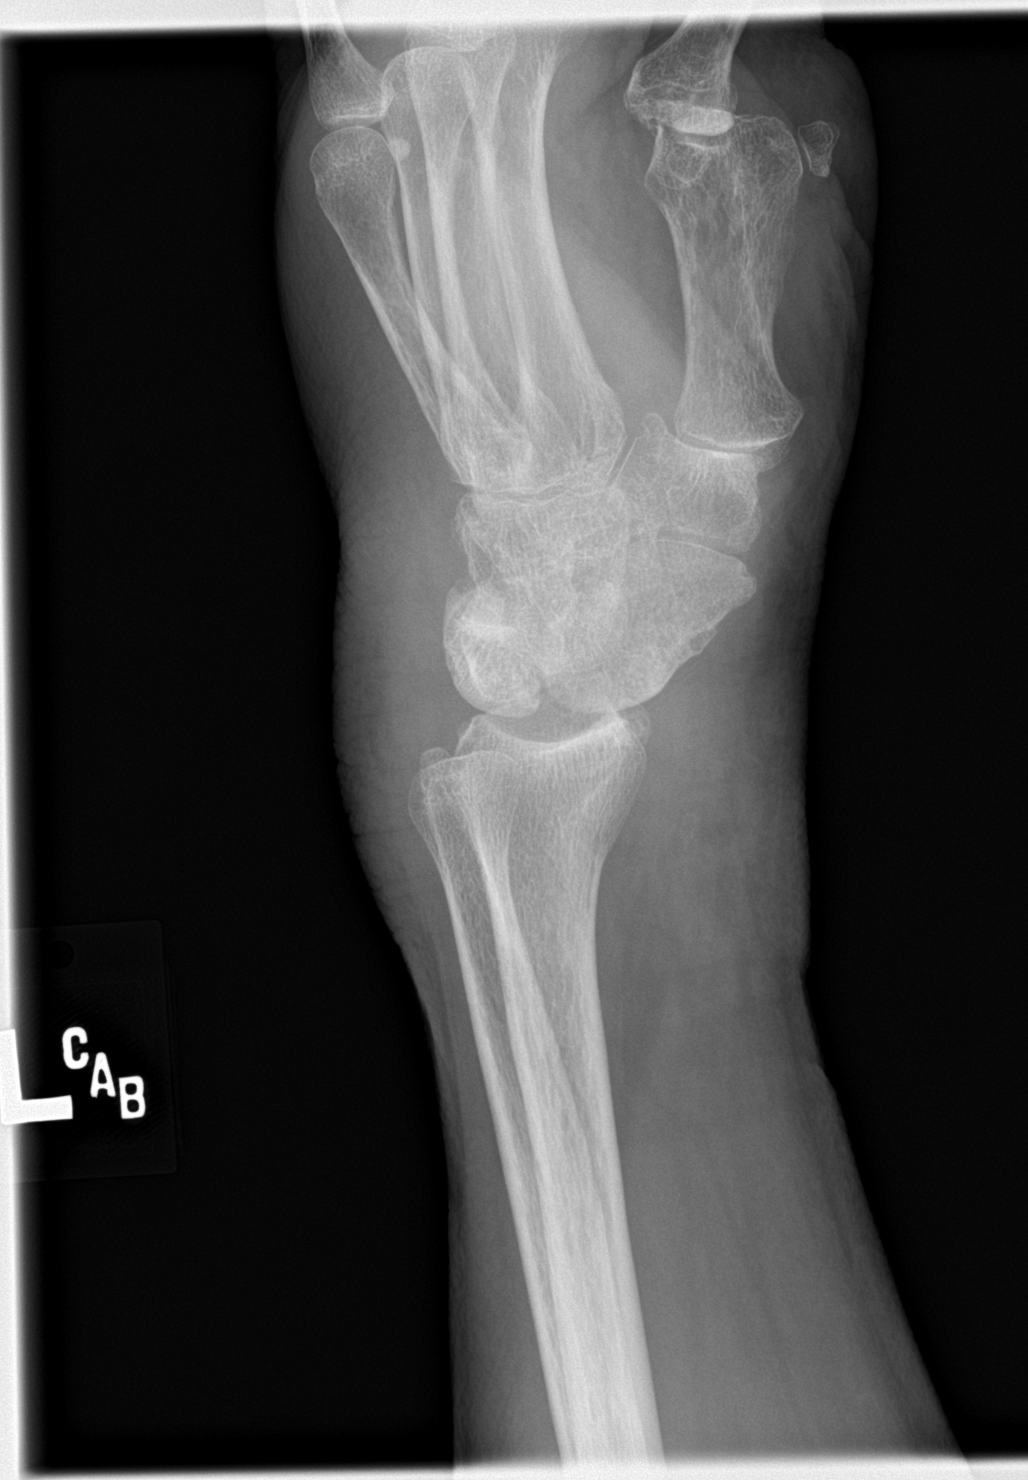

[wrist lat]
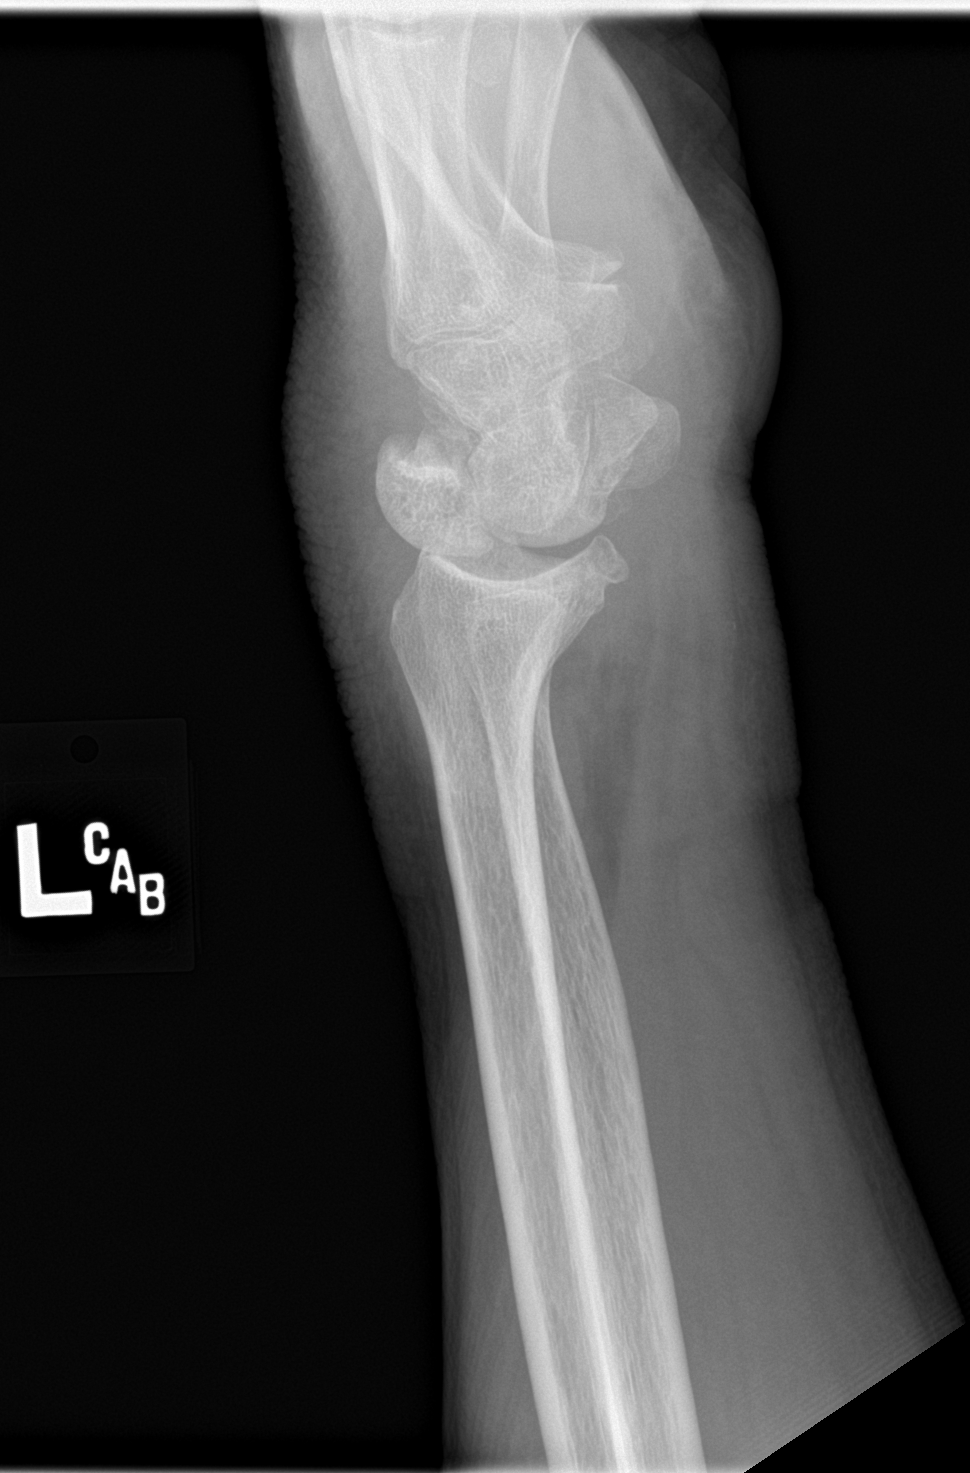

[wrist ap (2 of 2)]
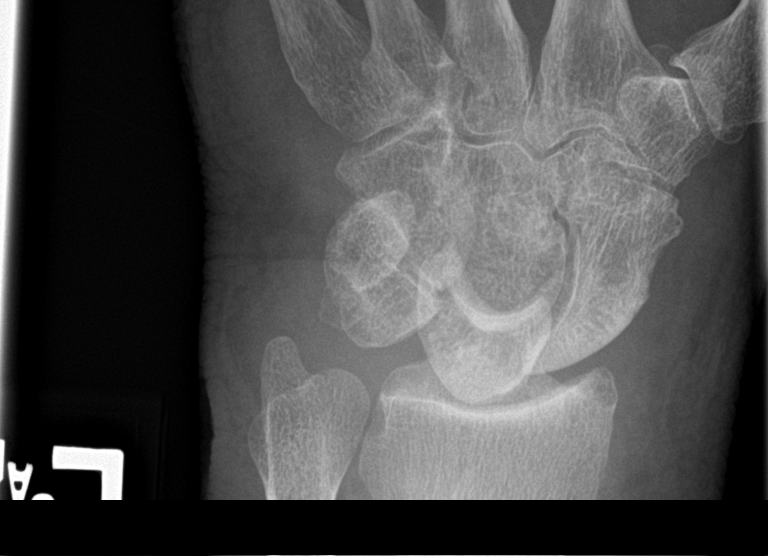

[4 of 4 positions shown; findings below may reference images not displayed]

FINDINGS: There are degenerative changes of the radiocarpal joint and over the
carpal bones. Ulnar plus variance is present. Mild degenerate change
over the first carpometacarpal joint. There is evidence of carpal
ligament instability with perilunate dislocation as the lunate is
completely posterior to the scaphoid and capitate on the lateral
film. This finding appears new since 03/03/2016. No acute fracture.
IMPRESSION: Evidence of carpal ligamentous instability with perilunate
dislocation as the lunate is completely posterior to the capitate
and scaphoid on the lateral film.

Degenerative changes over the radiocarpal joint and carpal bones.

## 2019-02-23 IMAGING — DX DG CHEST 1V
1 series · 1 of 1 positions shown · non-contrast
Comparison: 09/26/2016

CLINICAL DATA: Weakness and dyspnea.

EXAM:
CHEST 1 VIEW

[chest ap]
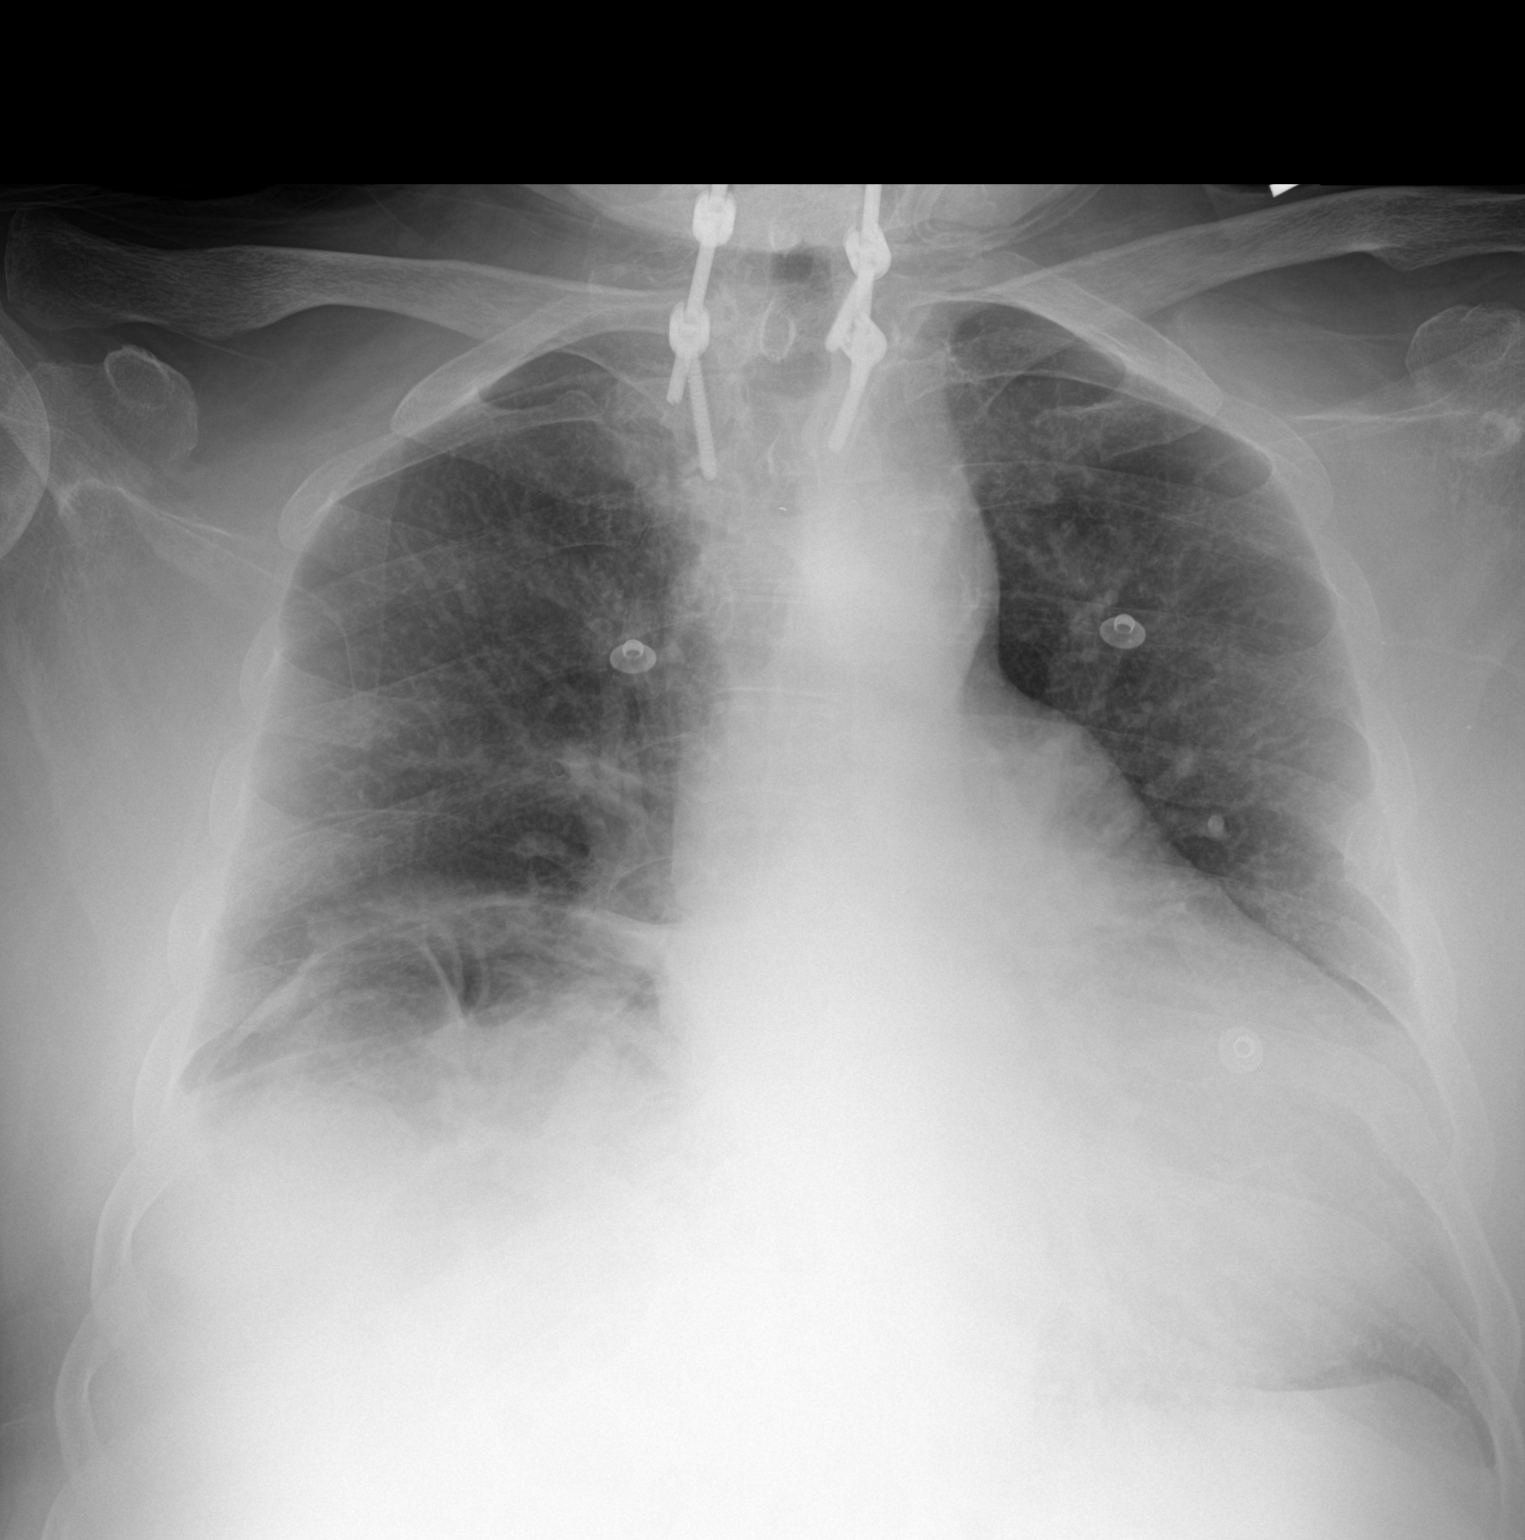

[1 of 1 positions shown; findings below may reference images not displayed]

FINDINGS: The cardiac silhouette remains enlarged. Aortic atherosclerosis is
again noted. There is persistent moderate elevation of the right
hemidiaphragm with gas-filled colon located between diaphragm and
liver, similar to the prior study. Minimal right basilar lung
opacity along the diaphragm likely represents atelectasis. The left
lung is clear. No sizable pleural effusion or pneumothorax is
identified. Cervicothoracic spinal fusion and old left rib fractures
are noted.
IMPRESSION: Chronic elevation of the right hemidiaphragm with minimal adjacent
atelectasis. No evidence of pneumonia or edema.

## 2019-05-11 IMAGING — DX DG CHEST 1V PORT
1 series · 1 of 1 positions shown · non-contrast
Comparison: Chest x-ray 06/24/2017.

CLINICAL DATA: Swelling lower extremities.  Difficulty breathing.

EXAM:
PORTABLE CHEST 1 VIEW

[chest ap]
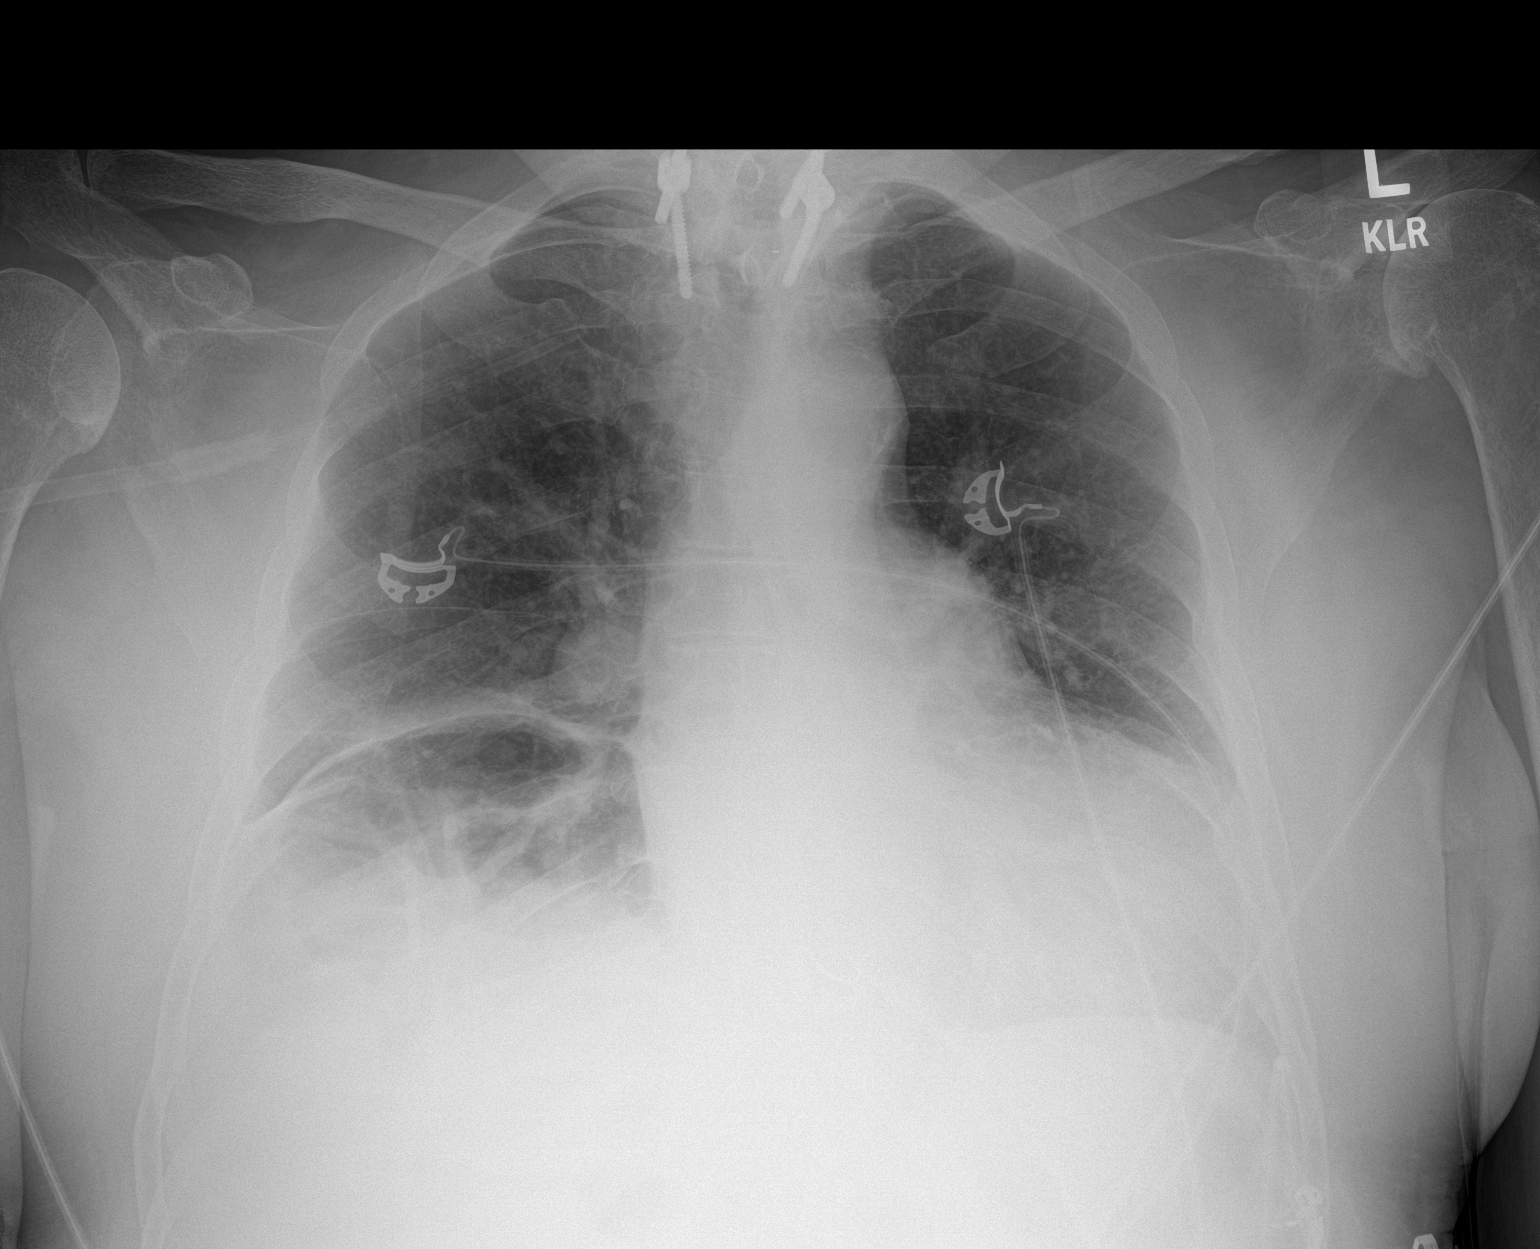

[1 of 1 positions shown; findings below may reference images not displayed]

FINDINGS: Cardiomegaly. Normal pulmonary vascularity. No focal infiltrate.
Stable elevation right hemidiaphragm. Interposition of the colon
under the right hemidiaphragm again noted. Cervicothoracic spine
fusion.
IMPRESSION: 1. Stable cardiomegaly. No pulmonary venous congestion. No acute
pulmonary disease. 2. Stable elevation of the right hemidiaphragm
with interposition of the right colon under the hemidiaphragm.

## 2019-05-11 IMAGING — CT CT ANGIO CHEST
1 of 6 series · 18 of 36 positions shown · IV contrast (APPLIED)
Comparison: Chest CT March 23, 2016 and chest radiograph September 09, 2017

CLINICAL DATA: Shortness of breath and increased oxygen saturation

EXAM:
CT ANGIOGRAPHY CHEST WITH CONTRAST
TECHNIQUE: Multidetector CT imaging of the chest was performed using the
standard protocol during bolus administration of intravenous
contrast. Multiplanar CT image reconstructions and MIPs were
obtained to evaluate the vascular anatomy.
CONTRAST:  75mL 21YENC-CZ2 IOPAMIDOL (21YENC-CZ2) INJECTION 76%

[Series 5: thins · axial · 0.80mm/px · z∈[-309,-22]mm · 18 of 319 slices shown]
[im 16/319  lung]
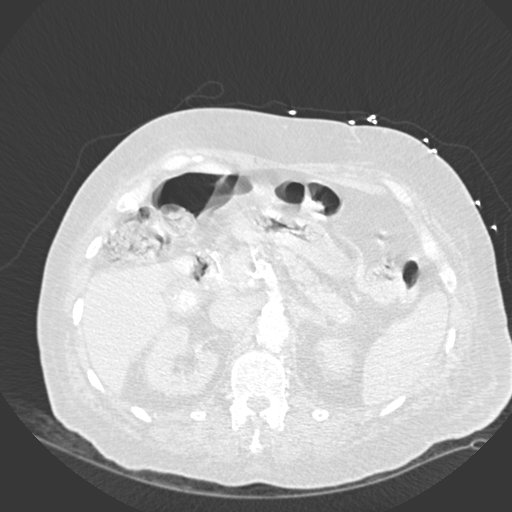
[im 32/319  mediastinal]
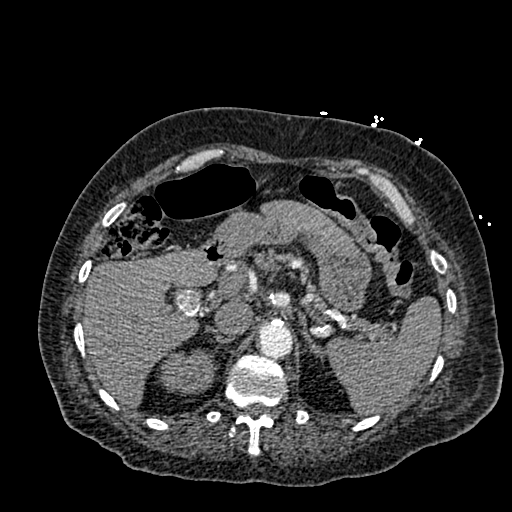
[im 48/319  lung]
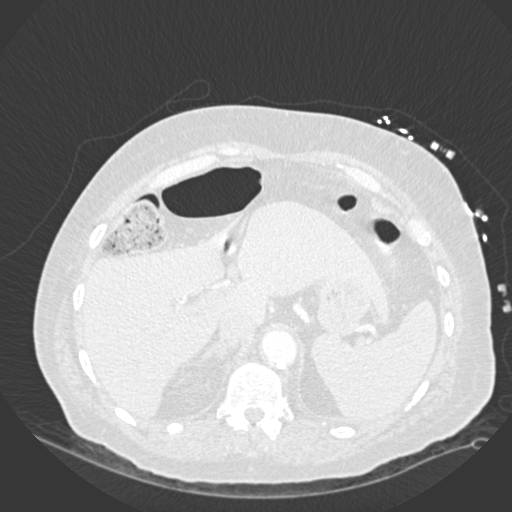
[im 64/319  mediastinal]
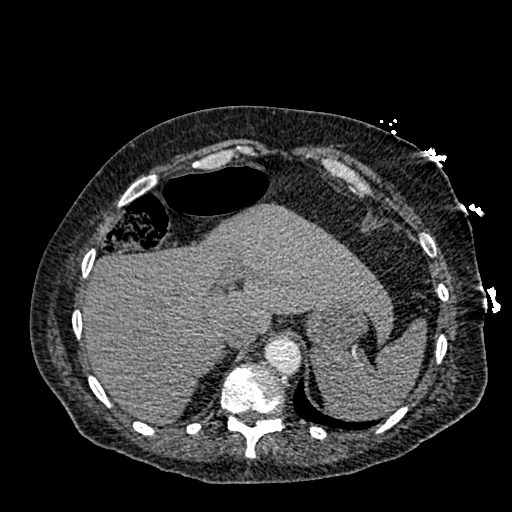
[im 80/319  lung]
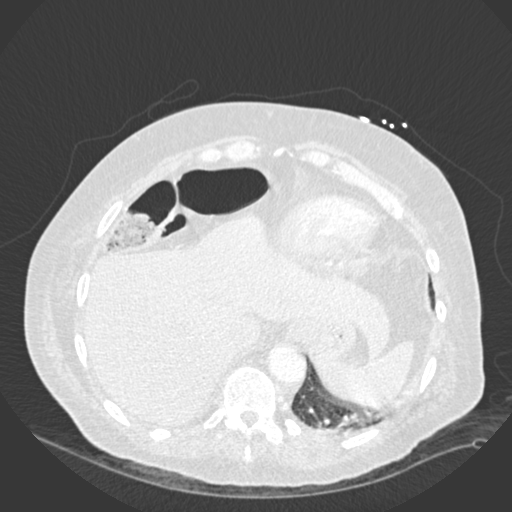
[im 96/319  mediastinal]
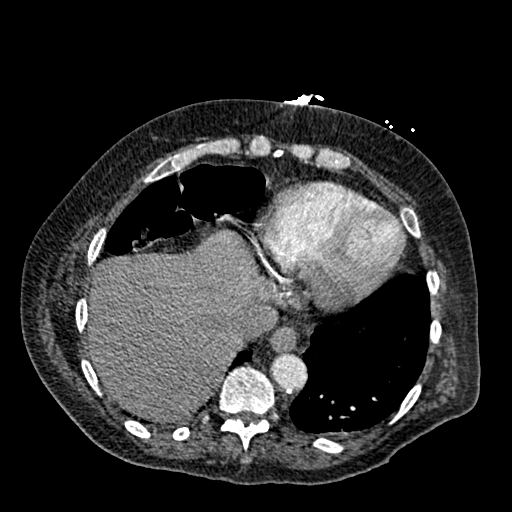
[im 112/319  lung]
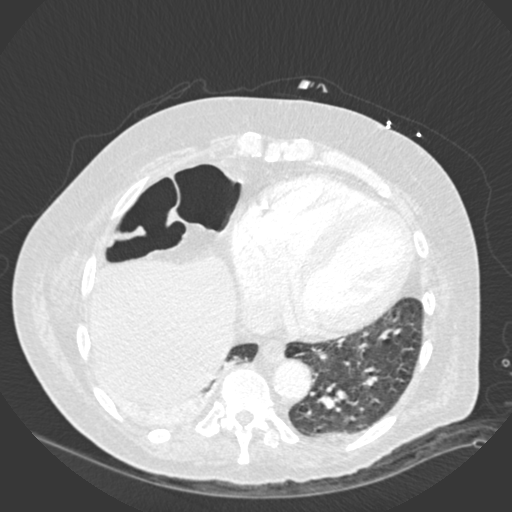
[im 128/319  mediastinal]
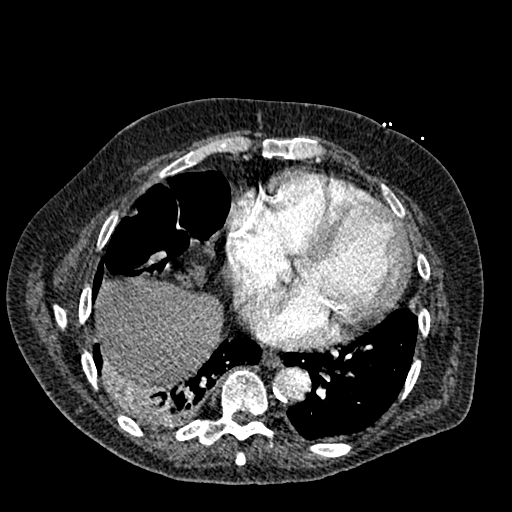
[im 144/319  lung]
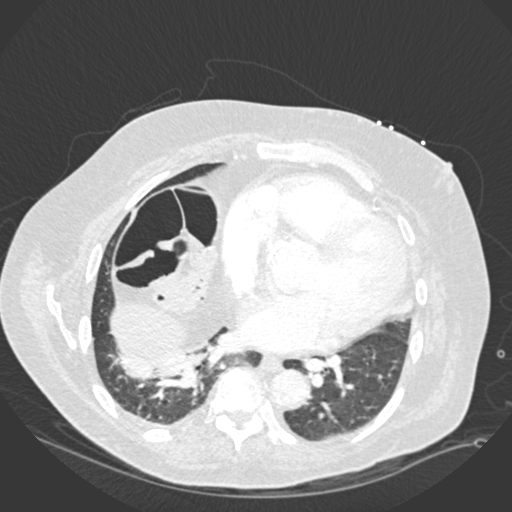
[im 175/319  mediastinal]
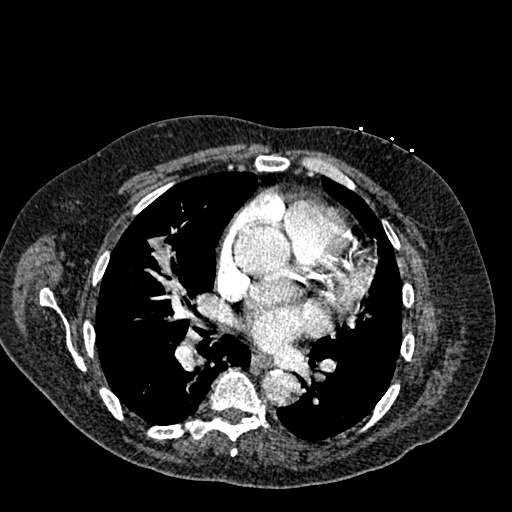
[im 191/319  lung]
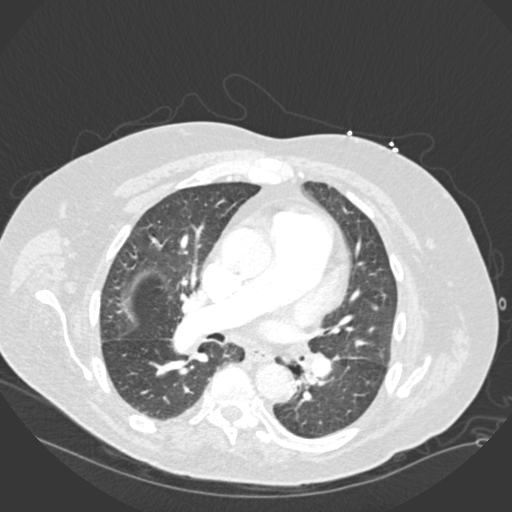
[im 207/319  mediastinal]
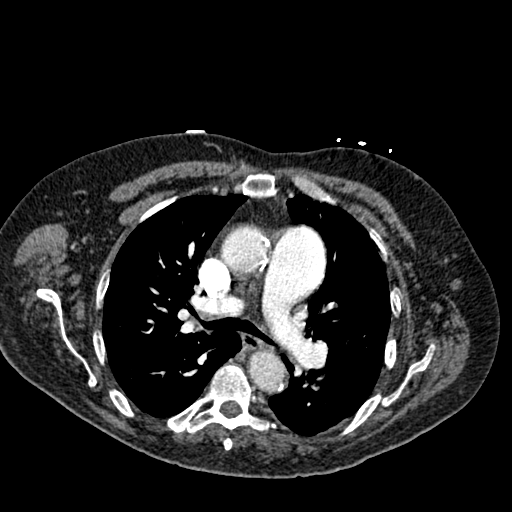
[im 223/319  lung]
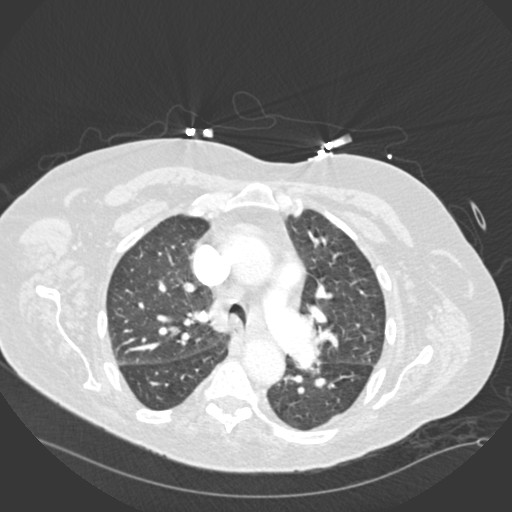
[im 239/319  mediastinal]
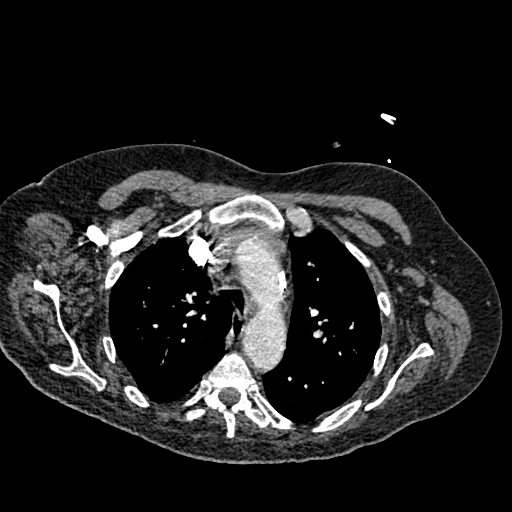
[im 255/319  lung]
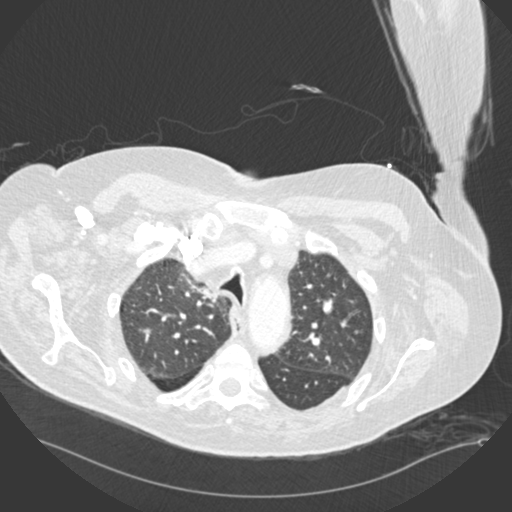
[im 271/319  mediastinal]
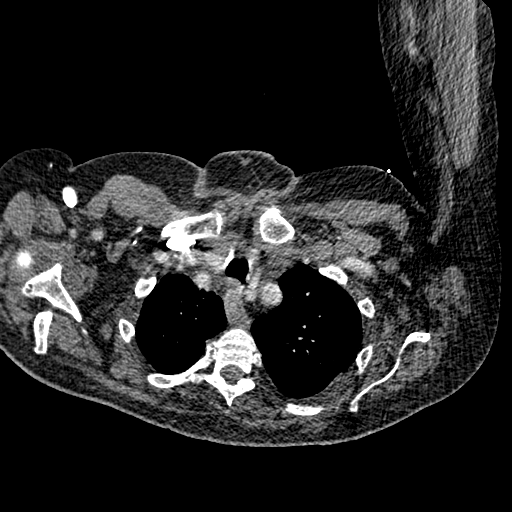
[im 287/319  lung]
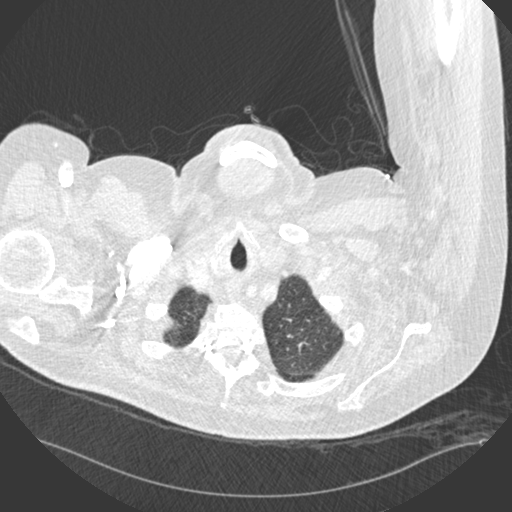
[im 303/319  mediastinal]
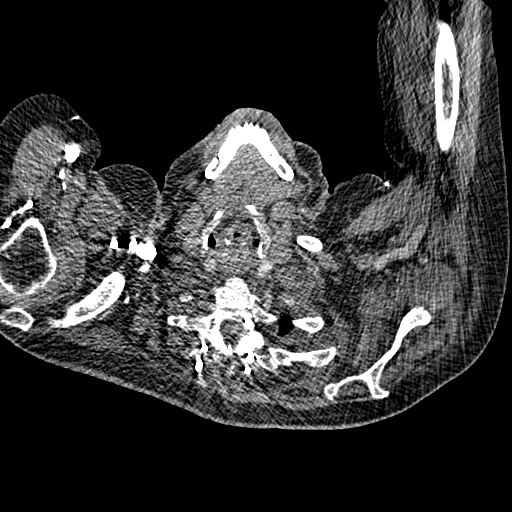

[18 of 36 positions shown; findings below may reference images not displayed]

FINDINGS: Cardiovascular: There is no demonstrable pulmonary embolus. There is
no appreciable thoracic aortic aneurysm or dissection. There is mild
calcification in the proximal left subclavian artery. Other
visualized great vessels appear normal. There are foci of
atherosclerotic calcification in the aorta. There are multiple foci
of coronary artery calcification present. There is no appreciable
pericardial effusion or pericardial thickening.

There is prominence of the main pulmonary outflow tract measuring
4.4 cm in diameter, a finding indicative of pulmonary arterial
hypertension.

Mediastinum/Nodes: Thyroid appears unremarkable. There is no
appreciable thoracic adenopathy by size criteria. Multiple small
axillary lymph nodes are noted, somewhat more on the left than on
the right. No esophageal lesions are appreciable.

Lungs/Pleura: There is airspace consolidation in portions of the
medial and posterior segments of the left lower lobe. There is also
consolidation in the lateral segment of the right middle lobe. There
is apparent atelectatic change in the inferior lingula next to the
left heart border.

Upper Abdomen: There is cholelithiasis in the gallbladder. The
gallbladder wall does not appear appreciably thickened. There is
aortic atherosclerosis with a stent in the aorta, incompletely
visualized. There is also visualized great vessel calcification.
Visualized upper abdominal structures otherwise appear unremarkable.

Musculoskeletal: There is postoperative change in the lower cervical
spine. There are no blastic or lytic bone lesions. No evident chest
wall lesion.

Review of the MIP images confirms the above findings.
IMPRESSION: 1. No demonstrable pulmonary embolus. No thoracic aortic aneurysm or
dissection.

2. Enlargement of the main pulmonary outflow tract noted, a finding
consistent with pulmonary arterial hypertension.

3. Areas of airspace consolidation consistent with pneumonia in the
lateral segment right middle lobe as well as in the posteromedial
segments of the right lower lobe. There is atelectatic change in the
inferior lingula.

4.  No adenopathy by size criteria.

5.  Cholelithiasis.

6. Aortic atherosclerosis as well as foci of great vessel and
coronary artery calcification.

Aortic Atherosclerosis ([id number].U).

## 2019-06-05 IMAGING — CR DG LUMBAR SPINE COMPLETE 4+V
4 series · 5 of 5 positions shown · non-contrast
Comparison: June 13, 2017

CLINICAL DATA: Pain following fall

EXAM:
LUMBAR SPINE - COMPLETE 4+ VIEW

[l-spine ap]
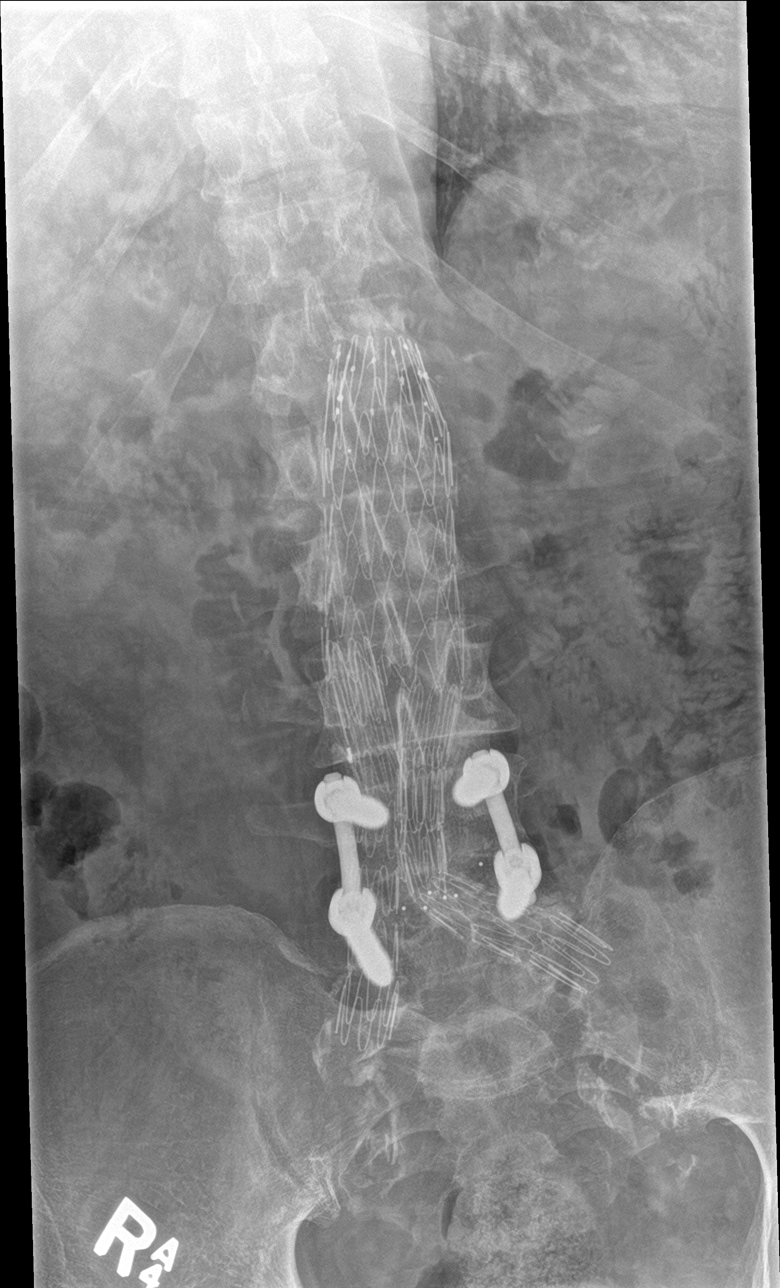

[l-spine obl (1 of 2)]
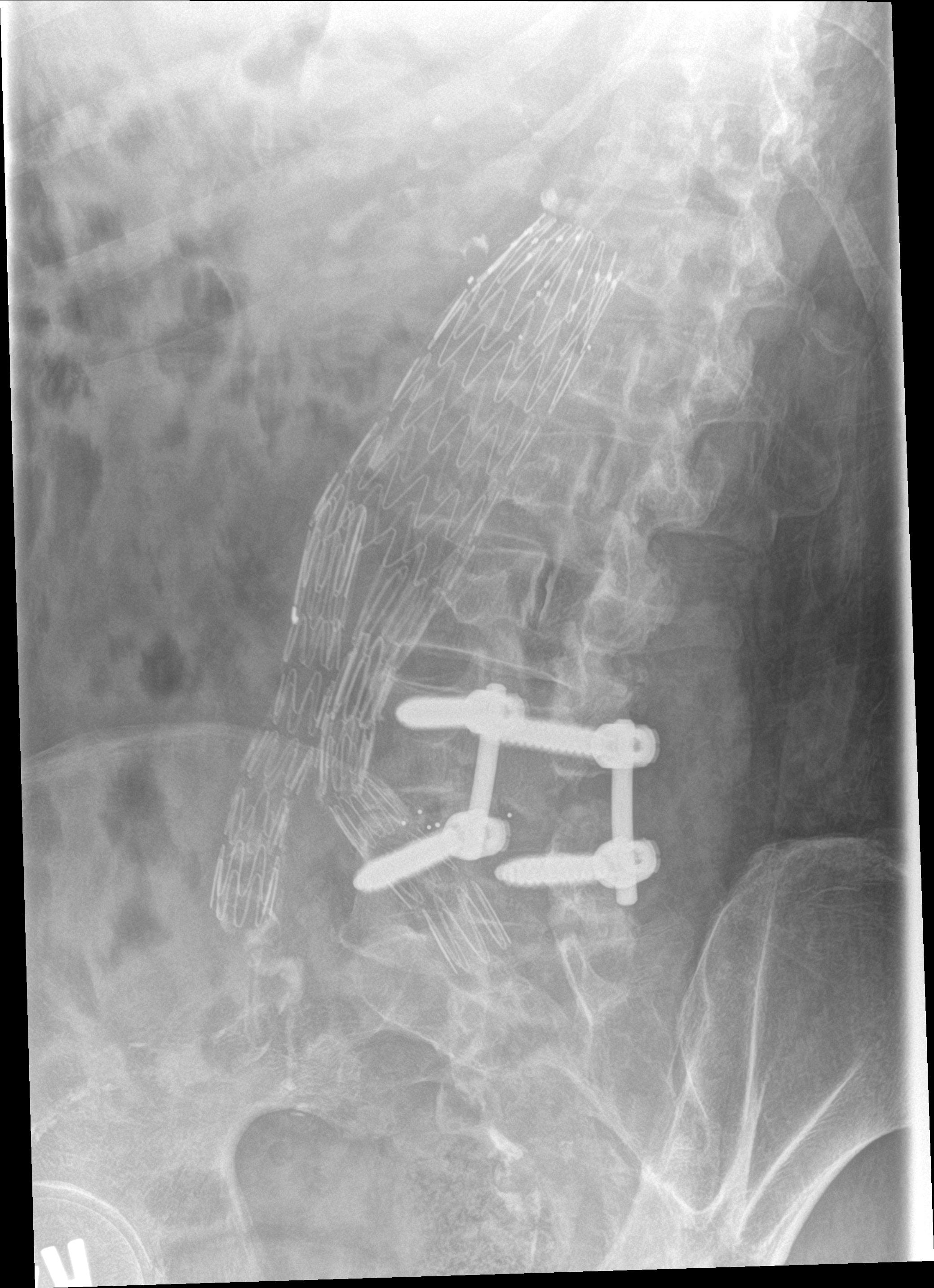

[l-spine obl (2 of 2)]
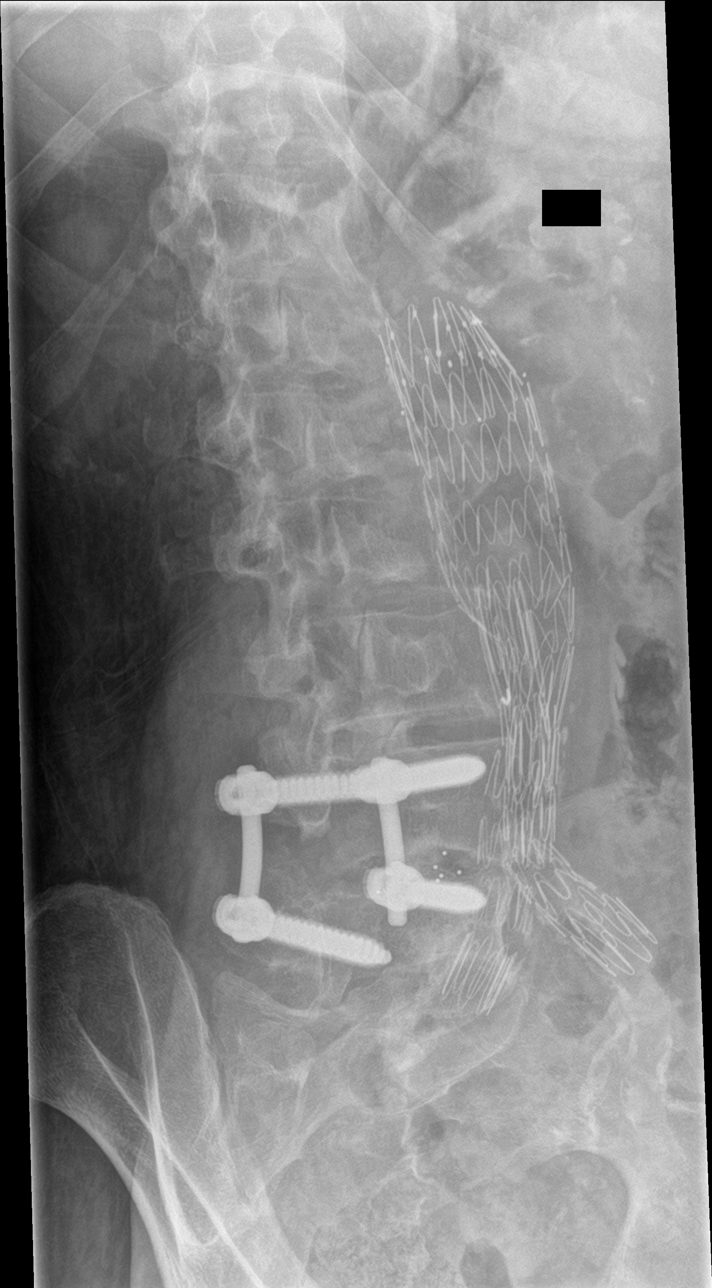

[Series 4: l-spine lat · 0.14mm/px · 2 of 2 slices shown]
[im 1/2]
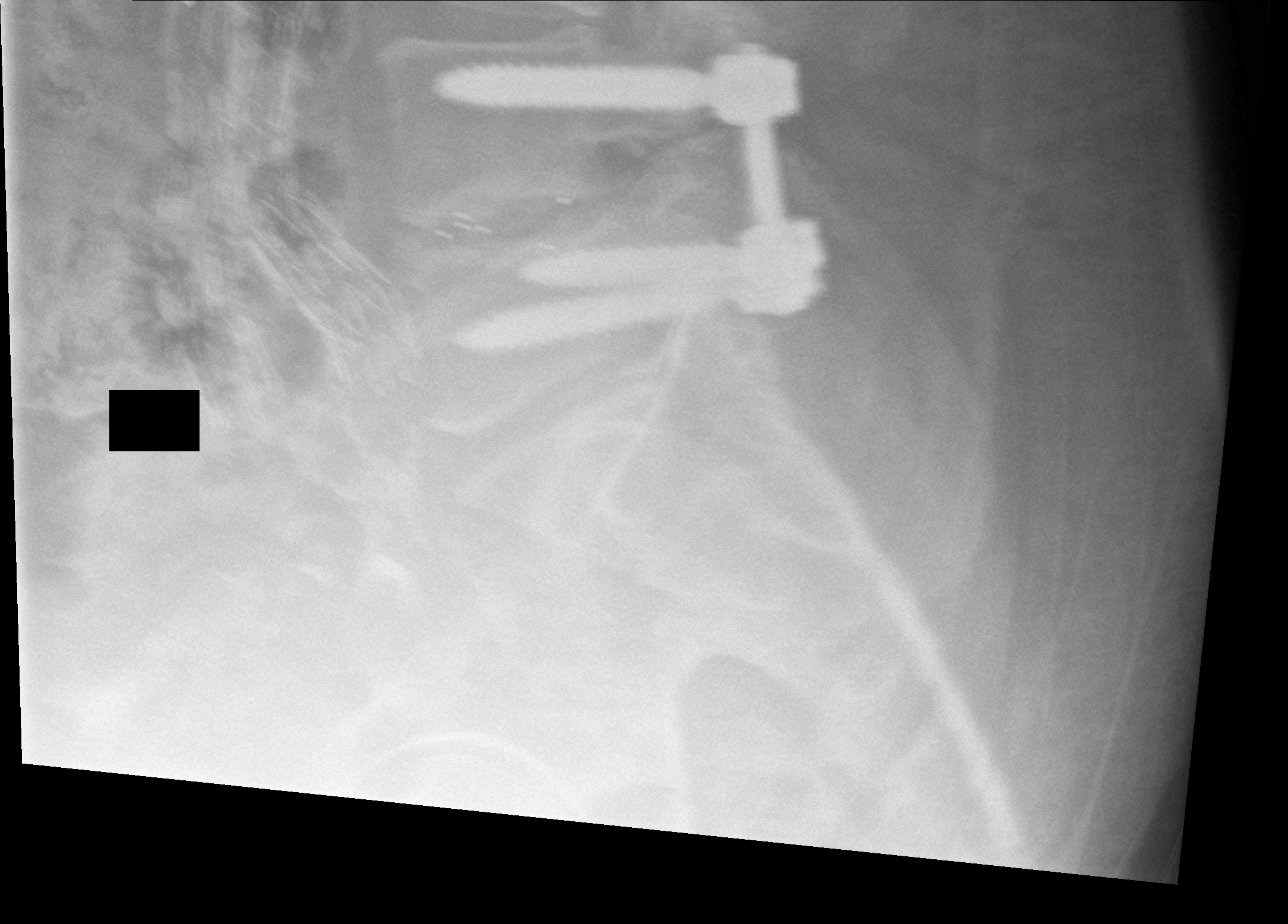
[im 2/2]
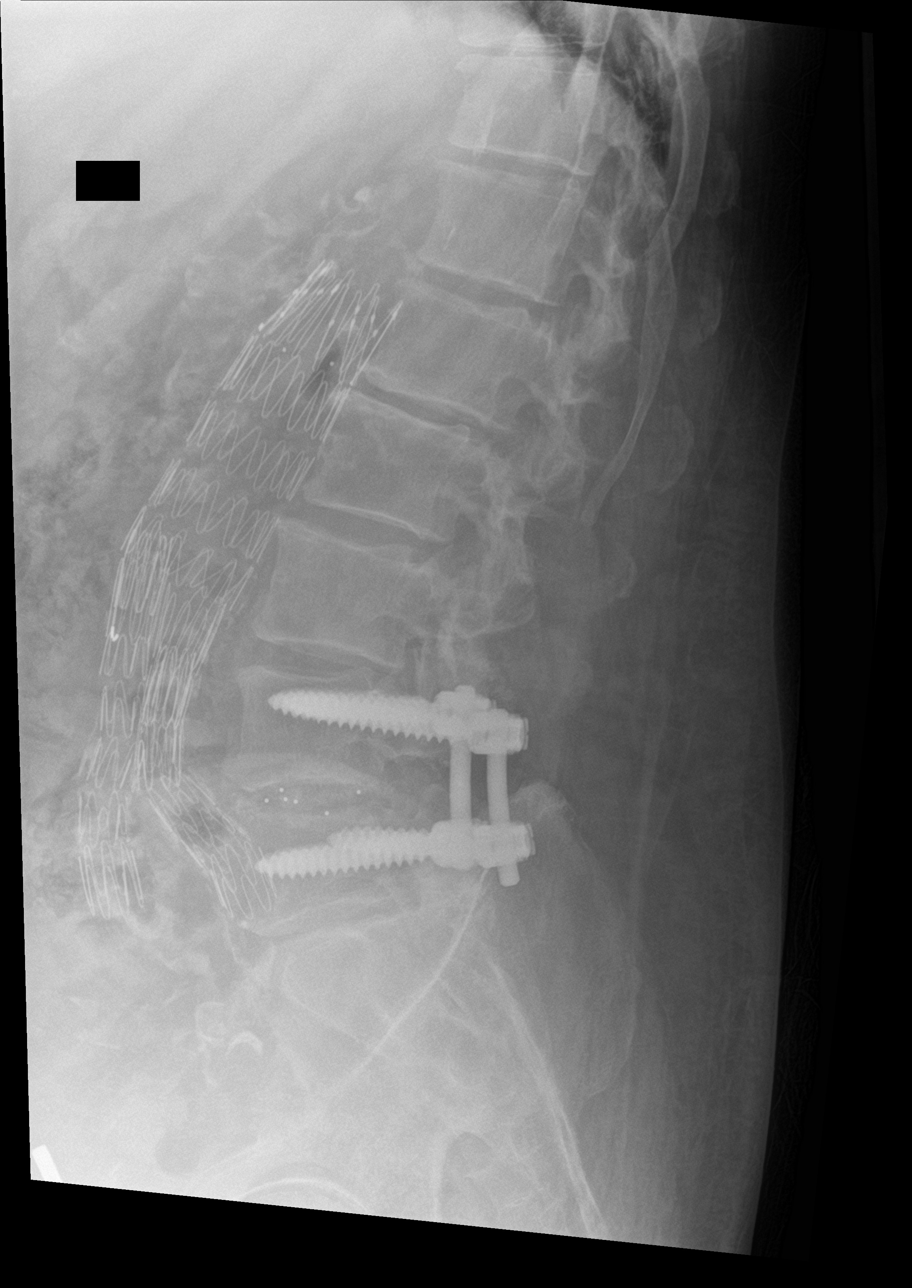

[5 of 5 positions shown; findings below may reference images not displayed]

FINDINGS: Frontal, lateral, spot lumbosacral lateral, and bilateral oblique
views were obtained. The there are 5 non-rib-bearing lumbar type
vertebral bodies. T12 ribs are hypoplastic. There is postoperative
change with posterior screw and plate fixation at L4 and L5. There
is a disc spacer at L4-5.

There is no fracture. There is minimal retrolisthesis of L1 on L2
and minimal retrolisthesis of L2 on L3, stable. There is no new
spondylolisthesis. There is disc space narrowing at L3-4 which has
increased from prior study. Disc space narrowing at L4-5 and L5-S1
are stable. There is facet osteoarthritic change at L4-5 and L5-S1
bilaterally and at L3-4 on the left, similar to prior studies.

There is a stent in the aorta and iliac arteries.
IMPRESSION: Postoperative change at L4 and L5. No fracture. Slight
retrolisthesis of L1 on L2 and L2 on L3, stable, likely due to
spondylosis. Slight increase in disc space narrowing at L3-4. Stable
disc space narrowing at L4-5 and L5-S1. Stable facet osteoarthritic
changes several levels. Stent in aorta and iliac arteries again
noted.

## 2019-06-05 IMAGING — CR DG CHEST 1V
1 series · 1 of 1 positions shown · non-contrast
Comparison: Chest radiograph September 09, 2017 and chest CT September 09, 2017

CLINICAL DATA: Pain following fall

EXAM:
CHEST  1 VIEW

[chest pa]
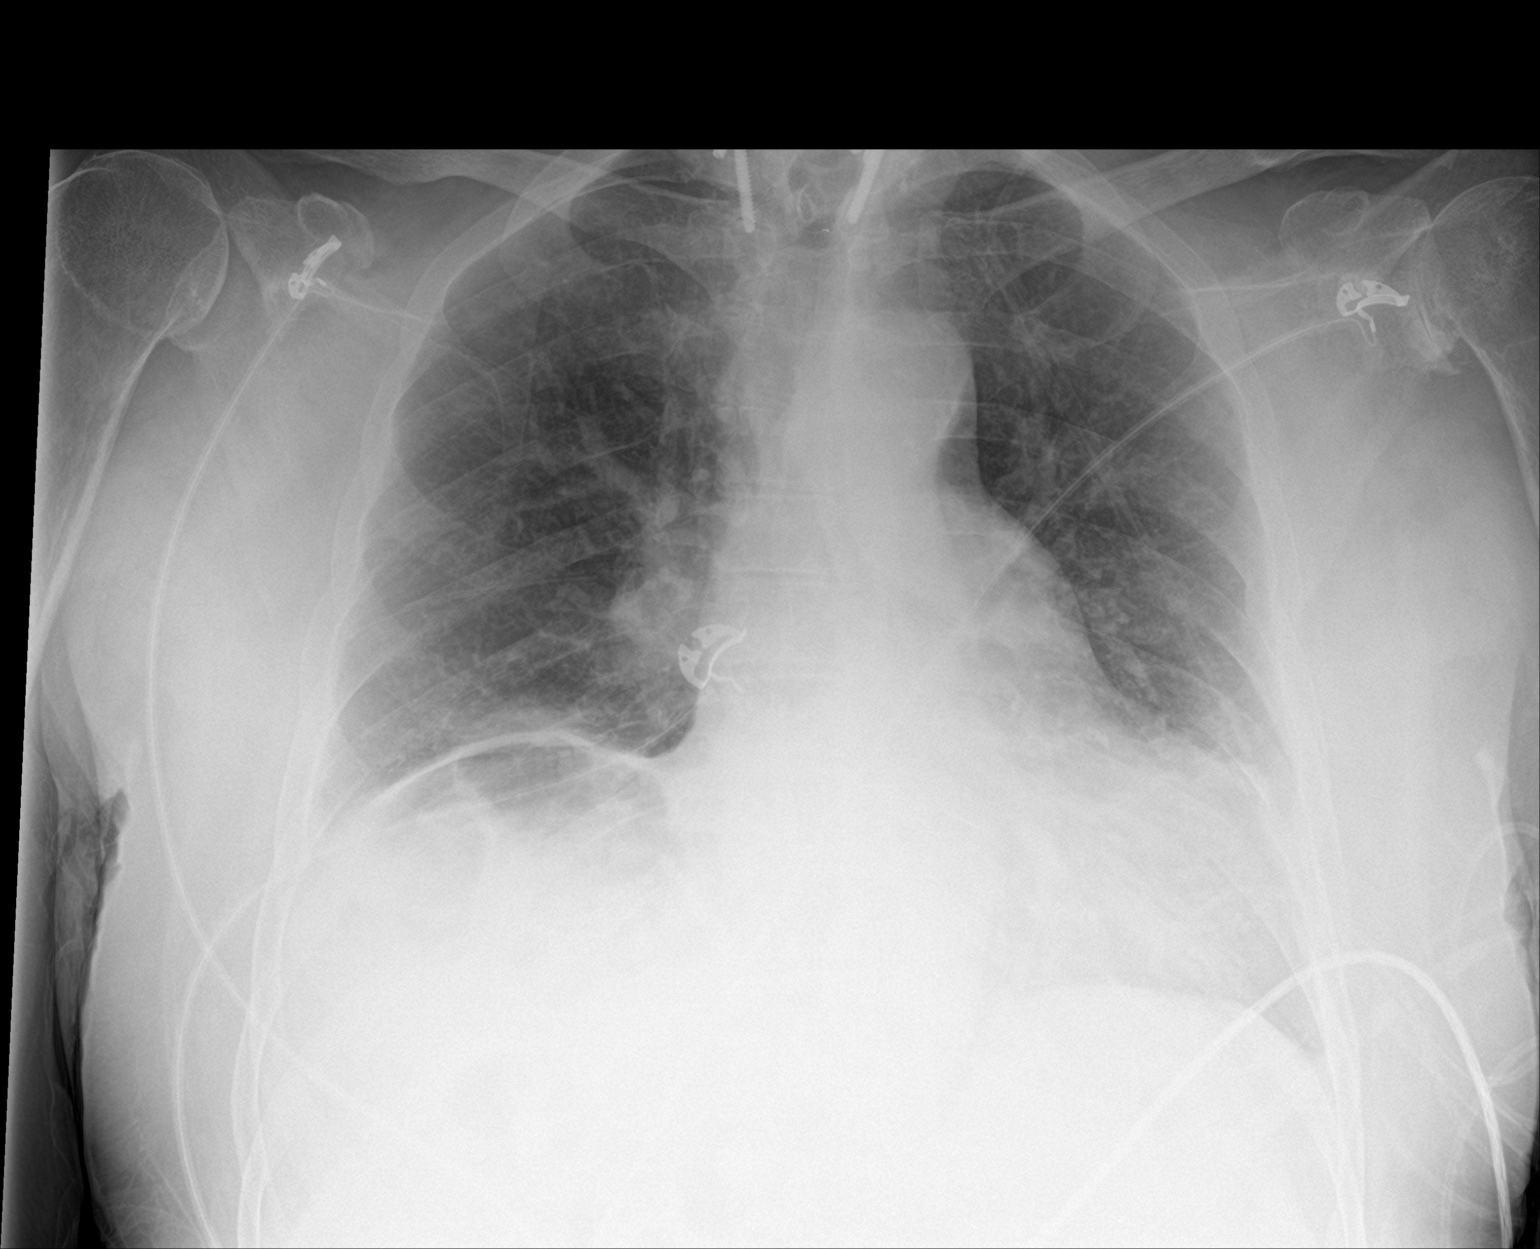

[1 of 1 positions shown; findings below may reference images not displayed]

FINDINGS: Mild elevation of the right hemidiaphragm is stable. There is mild
bibasilar atelectasis. There is no edema or consolidation. Heart is
borderline enlarged with pulmonary vascularity within normal limits.
No adenopathy. There is aortic atherosclerosis. Postoperative
changes noted in the lower cervical and upper thoracic regions.
There is advanced arthropathy in the left shoulder. There is colonic
interposition between the right hemidiaphragm and liver. No
pneumothorax.
IMPRESSION: Mild bibasilar atelectasis. No edema or consolidation. Stable
cardiac silhouette. There is aortic atherosclerosis.

Aortic Atherosclerosis ([id number]).

## 2019-06-05 IMAGING — CT CT CERVICAL SPINE W/O CM
4 of 6 series · 15 of 33 positions shown, 16 images · non-contrast
Comparison: 09/26/2017 CT head and cervical spine. 09/26/2017 MRI
head. 02/23/2017 MRI cervical spine.

CLINICAL DATA: 72 y/o  M; multiple falls with head trauma.

EXAM:
CT HEAD WITHOUT CONTRAST
CT CERVICAL SPINE WITHOUT CONTRAST
TECHNIQUE: Multidetector CT imaging of the head and cervical spine was
performed following the standard protocol without intravenous
contrast. Multiplanar CT image reconstructions of the cervical spine
were also generated.

[Series 5: coronal soft tissue · coronal · 0.31mm/px · 3 of 68 slices shown]
[im 14/68  bone]
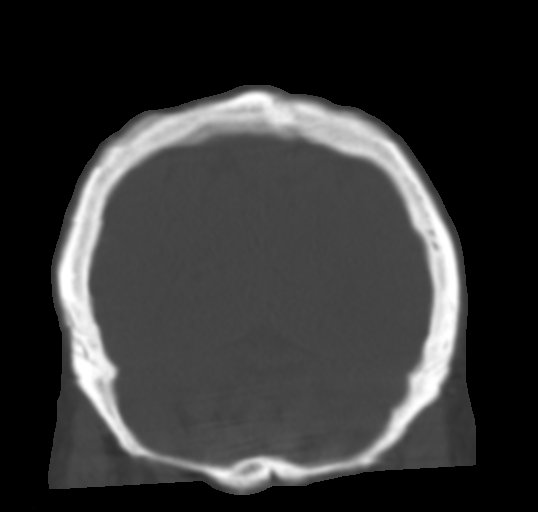
[im 27/68  bone]
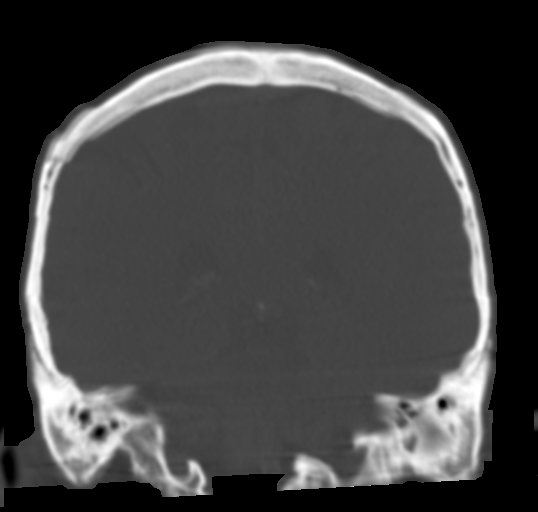
[im 41/68  bone]
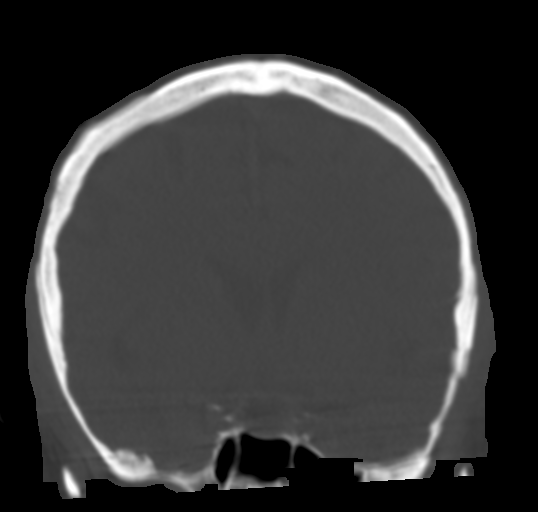

[Series 7: c spine soft · axial · 0.37mm/px · z∈[-252,-188]mm · 3 of 81 slices shown]
[im 17/81  soft-tissue]
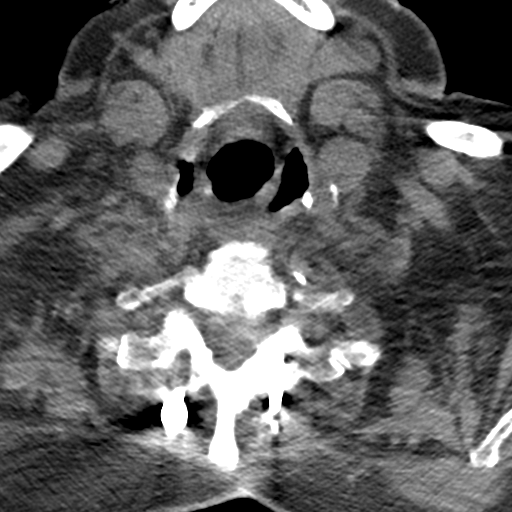
[im 33/81  soft-tissue]
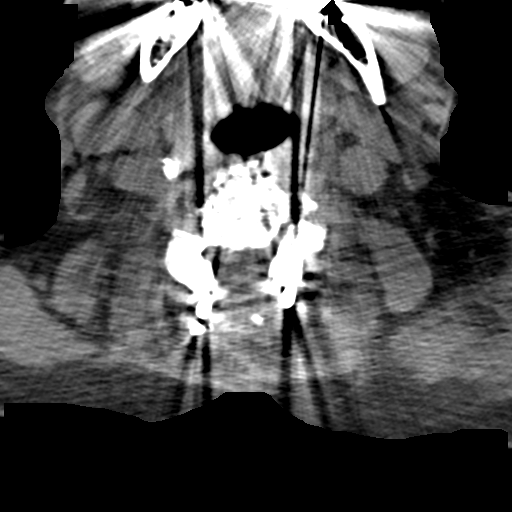
[im 49/81  soft-tissue]
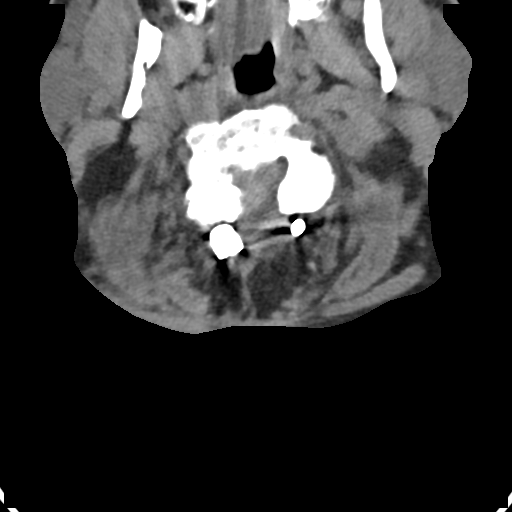

[Series 8: sagittal bone · sagittal · 0.26mm/px · 5 of 74 slices shown]
[im 11/74  bone]
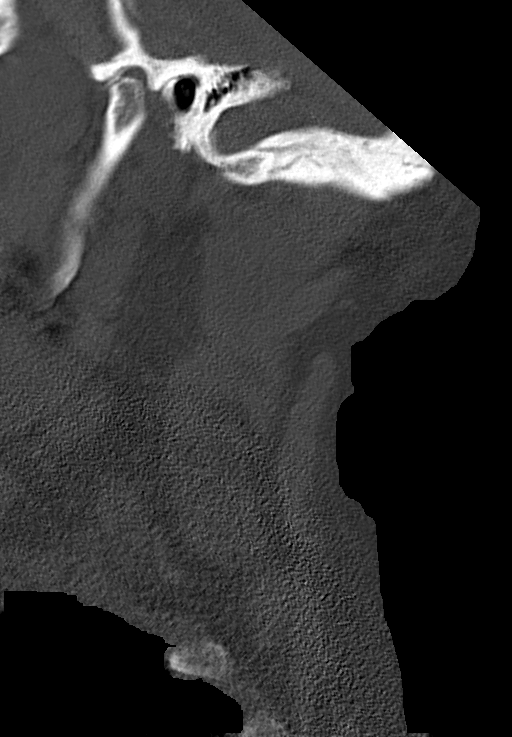
[im 21/74  bone]
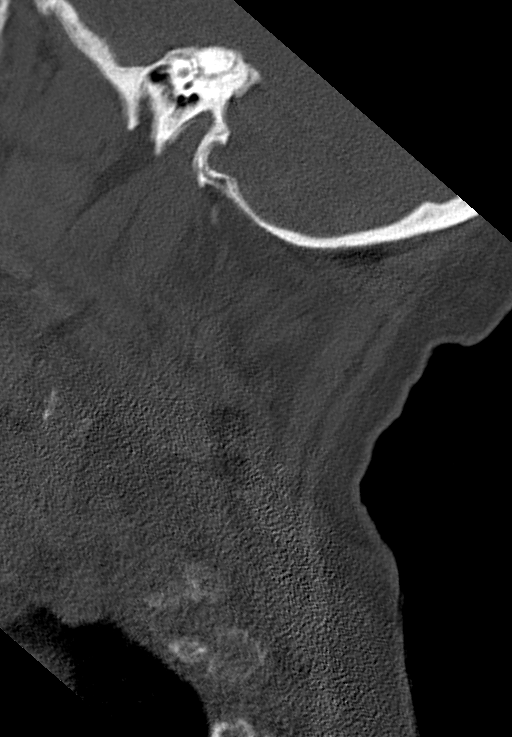
[im 32/74  bone]
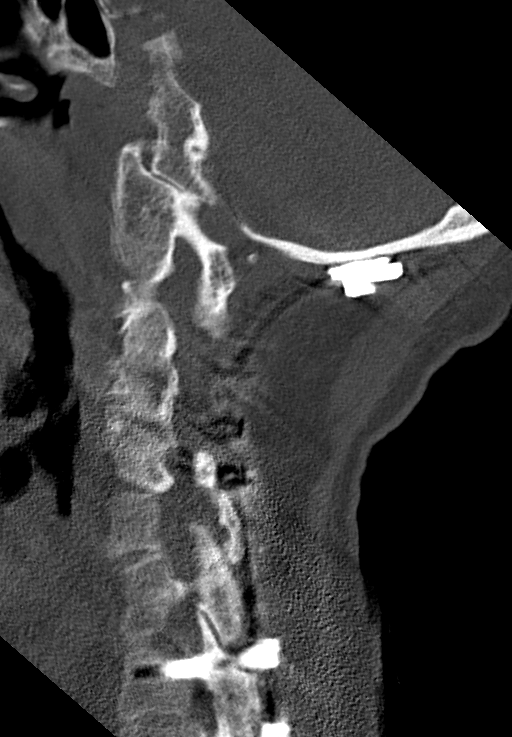
[im 42/74  bone]
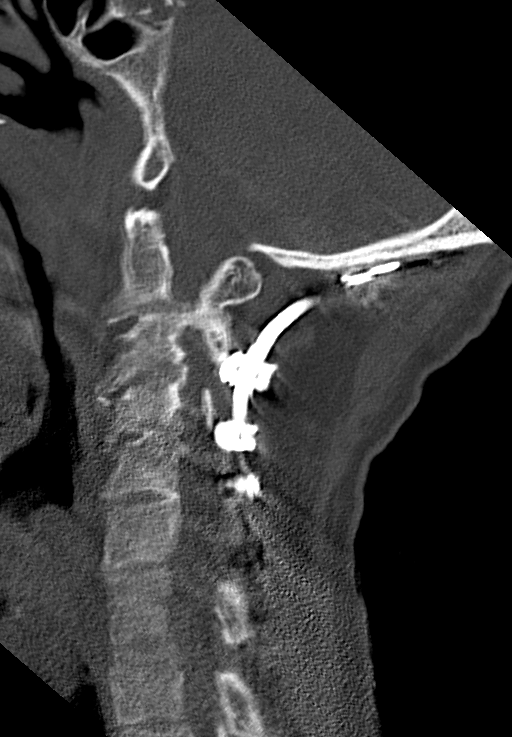
[im 53/74  bone]
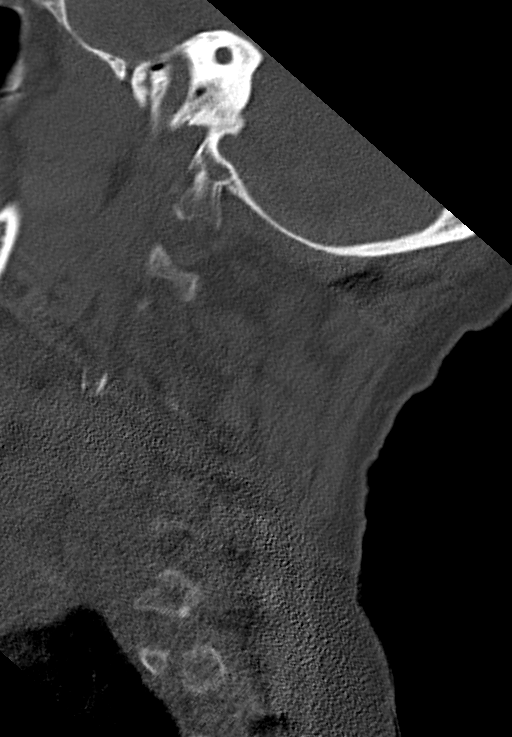

[Series 9: orthogonal bone · axial · 0.27mm/px · z∈[-280,-187]mm · 4 of 95 slices shown, 5 images]
[im 19/95  soft-tissue]
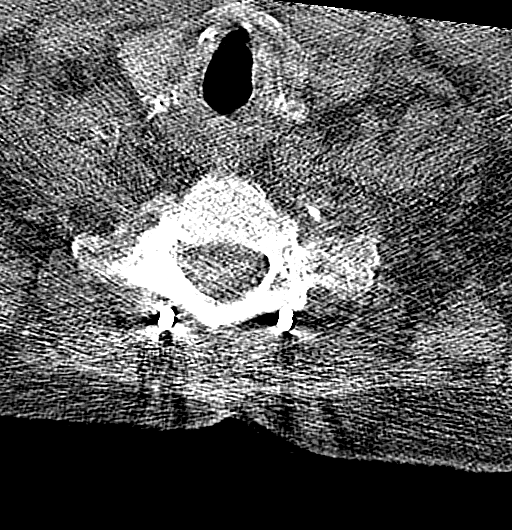
[im 19/95  bone]
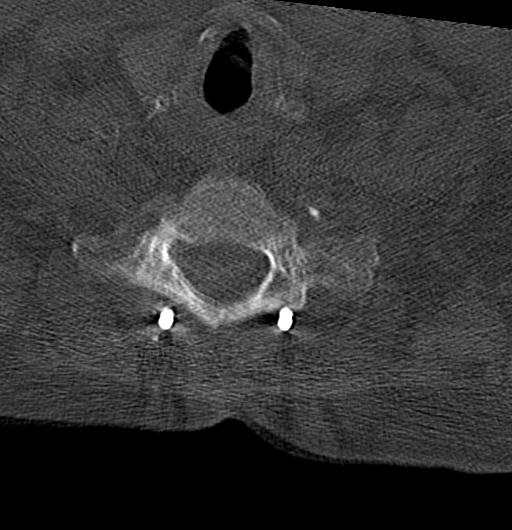
[im 38/95  bone]
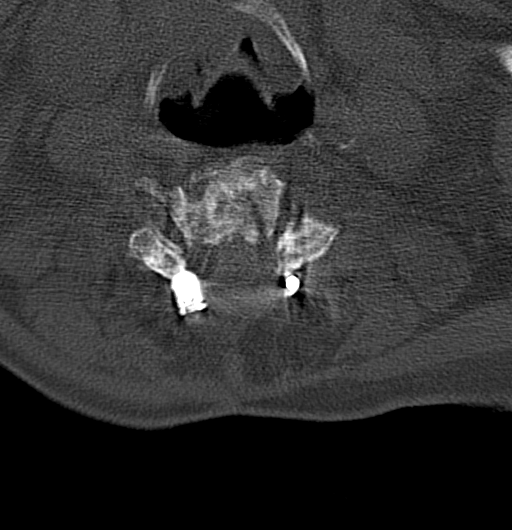
[im 57/95  bone]
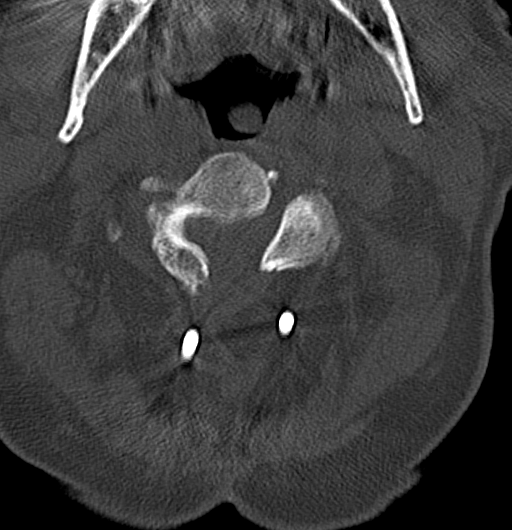
[im 76/95  bone]
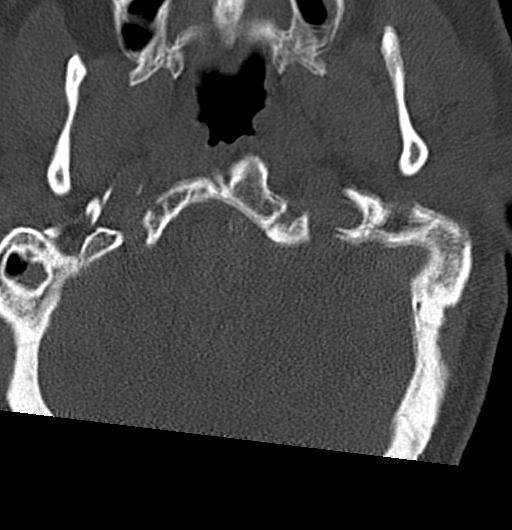

[15 of 33 positions shown; findings below may reference images not displayed]

FINDINGS: CT HEAD FINDINGS

Brain: No evidence of acute infarction, hemorrhage, hydrocephalus,
extra-axial collection or mass lesion/mass effect. Stable small
chronic infarction in the right inferolateral frontal lobe and
scattered nonspecific calcifications throughout the right cerebral
hemisphere. Stable chronic microvascular ischemic changes and
parenchymal volume loss of the brain.

Vascular: Extensive calcific atherosclerosis of carotid siphons and
vertebral arteries.

Skull: Mild soft tissue contusion to the midline parietal scalp. No
calvarial fracture.

Sinuses/Orbits: Partial right mastoid effusion.  Otherwise negative.

Other: None.

CT CERVICAL SPINE FINDINGS

Alignment: Straightening of cervical lordosis without listhesis.
Grade 1 C2-3 anterolisthesis.

Skull base and vertebrae: There is fusion of the right
atlantooccipital joint and left atlantoaxial joint. C1-C6
laminectomy. Fusion of the posterior elements to the occipital bone
rods and facet screws hardware extending to the T2 level. No
periprosthetic lucency or fracture identified. The facets are fused
on the right from C2-C7 with persistent arthrosis at C7-T1. The
facets on the left are fused from C2 to C5 with persistent arthrosis
at C5-6 and below.

Soft tissues and spinal canal: No prevertebral fluid or swelling. No
visible canal hematoma.

Disc levels: Uncovertebral and facet hypertrophy with bilateral
foraminal stenosis at the C2 through C5 levels.

Upper chest: Negative.

Other: Negative.
IMPRESSION: CT head:

1. Mild soft tissue contusion in the midline parietal scalp. No
calvarial fracture. No acute intracranial abnormality.
2. Stable chronic microvascular ischemic changes and parenchymal
volume loss of the brain. Stable small chronic infarction in the
right inferolateral frontal lobe.

CT cervical spine:

1. No acute fracture or dislocation.
2. Occipital to T2 posterior spinal fusion. No apparent hardware
related complication. Stable advanced degenerative changes of the
cervical spine.

By: Antti-Jussi Colliander M.D.

## 2019-06-10 IMAGING — CT CT CHEST W/ CM
2 of 3 series · 15 of 36 positions shown, 18 images · IV contrast (iopamidol)
Comparison: Single-view chest dated 10/04/2017 and chest CTA dated
09/09/2017.

CLINICAL DATA: Acute hypercapnic respiratory failure. Clinical
concern for pneumonia.

EXAM:
CT CHEST WITH CONTRAST
TECHNIQUE: Multidetector CT imaging of the chest was performed during
intravenous contrast administration.
CONTRAST:  75mL [id number] IOPAMIDOL ([id number]) INJECTION 61%

[Series 2: axial st · axial · 0.80mm/px · z∈[-631,-357]mm · 12 of 161 slices shown, 15 images]
[im 12/161  mediastinal]
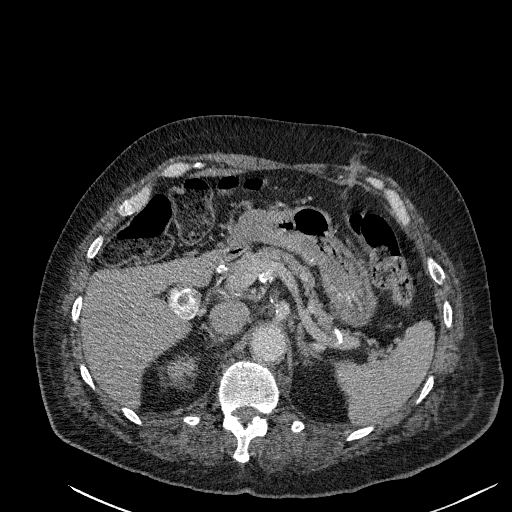
[im 12/161  lung]
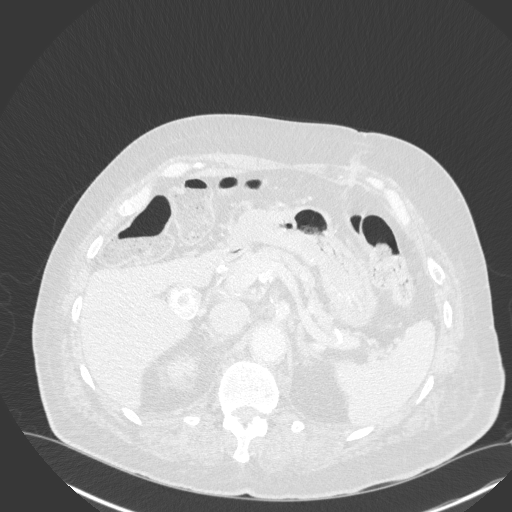
[im 24/161  lung]
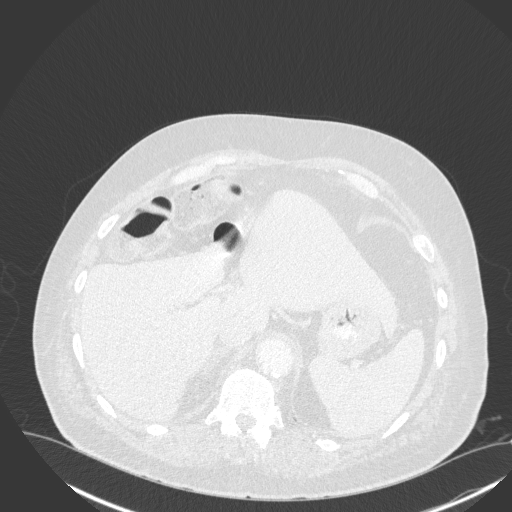
[im 36/161  lung]
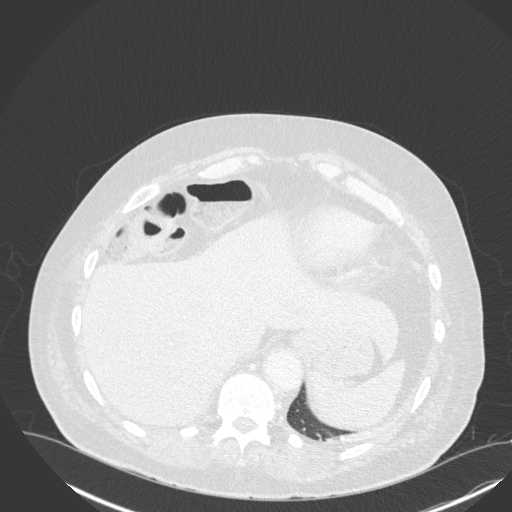
[im 48/161  lung]
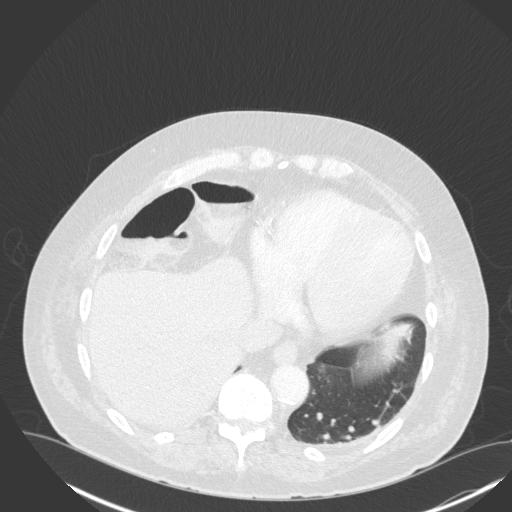
[im 60/161  mediastinal]
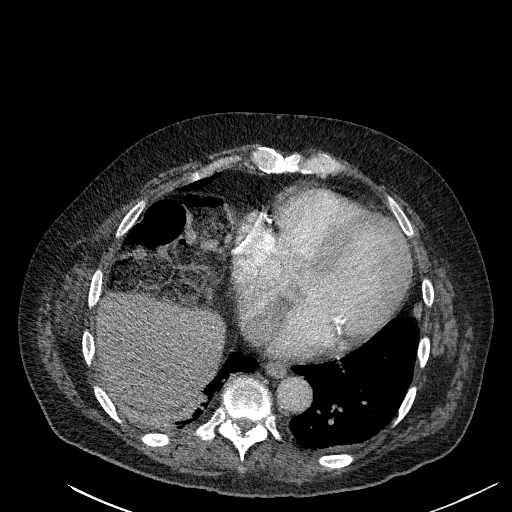
[im 60/161  lung]
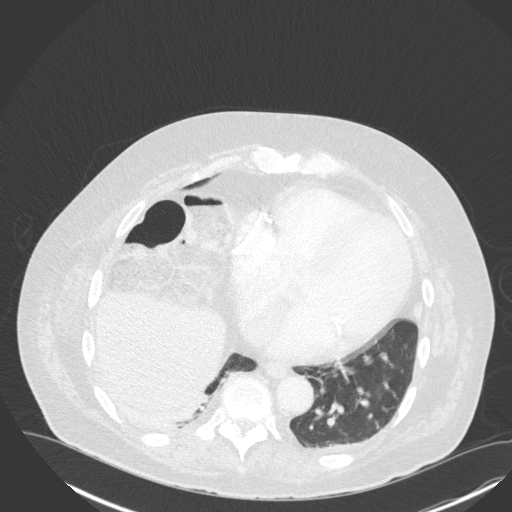
[im 72/161  lung]
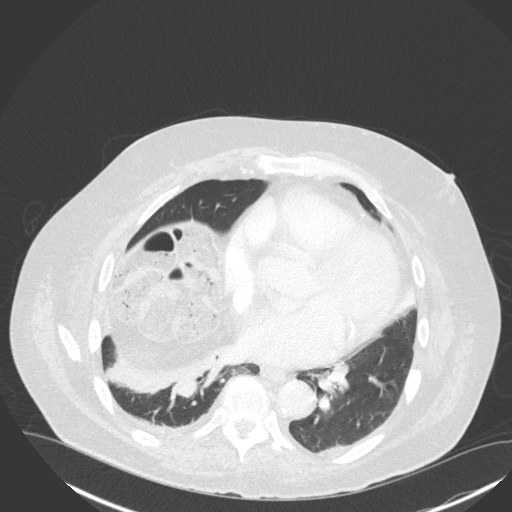
[im 89/161  lung]
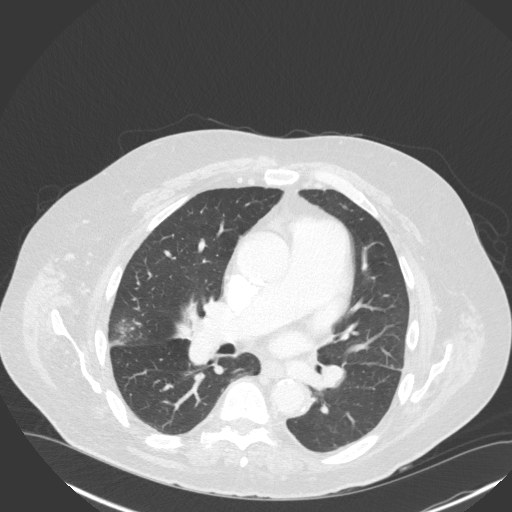
[im 101/161  lung]
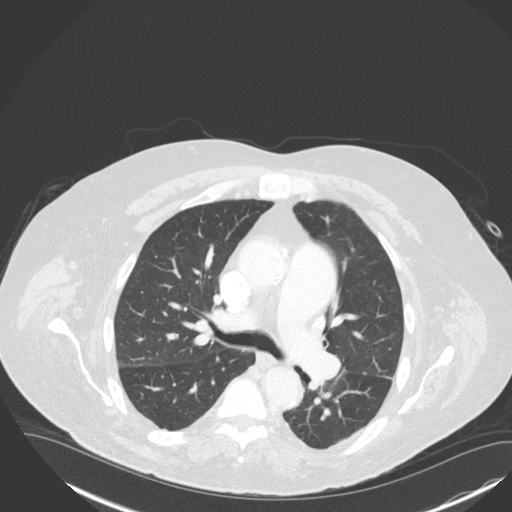
[im 113/161  mediastinal]
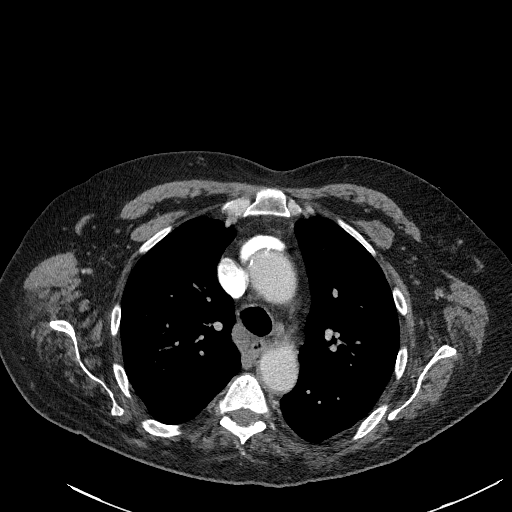
[im 113/161  lung]
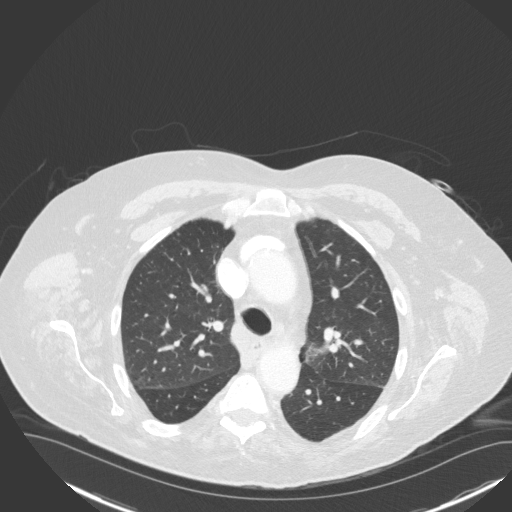
[im 125/161  lung]
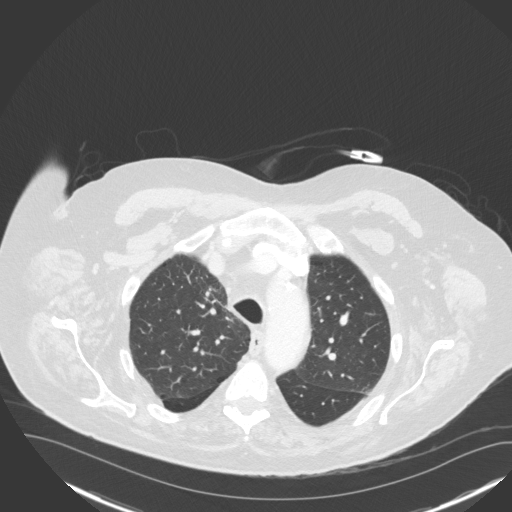
[im 137/161  lung]
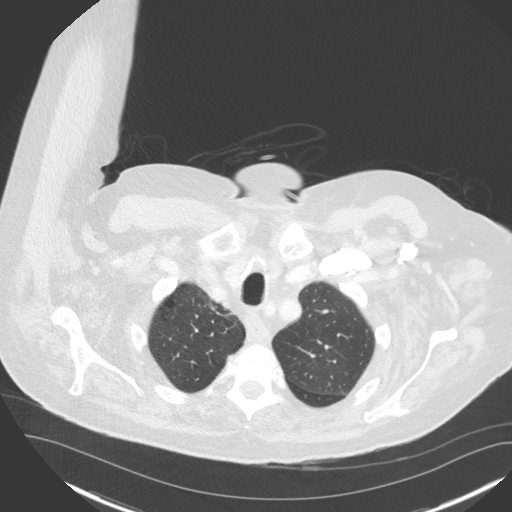
[im 149/161  lung]
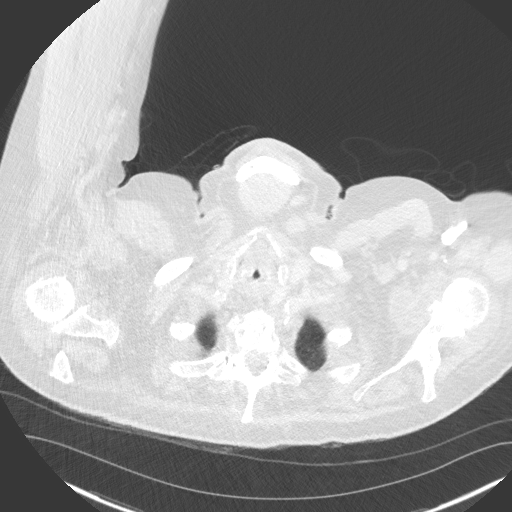

[Series 5: coronal · coronal · 0.67mm/px · 3 of 137 slices shown]
[im 28/137  lung]
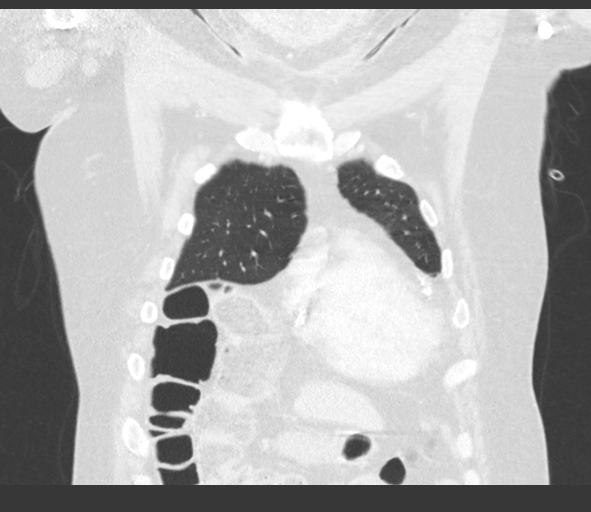
[im 55/137  lung]
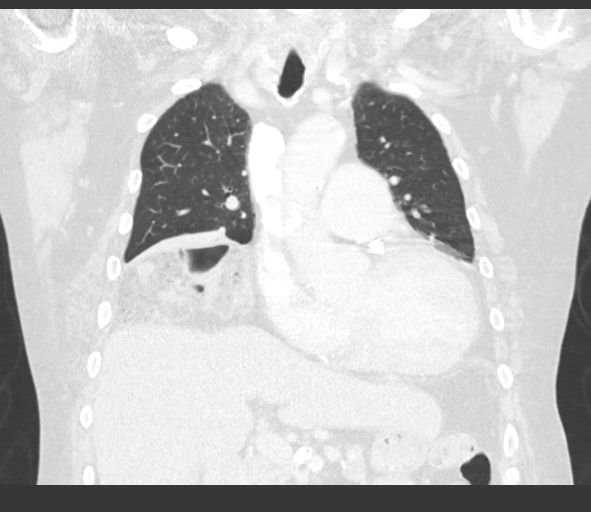
[im 82/137  lung]
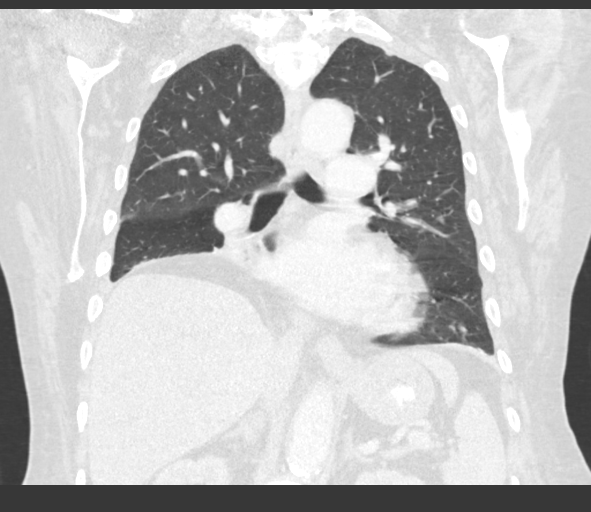

[15 of 36 positions shown; findings below may reference images not displayed]

FINDINGS: Cardiovascular: Atheromatous calcifications, including the coronary
arteries and aorta. Enlarged central pulmonary arteries. The main
pulmonary artery has a transverse diameter of 4.4 cm. Mildly
enlarged heart.

Mediastinum/Nodes: No enlarged mediastinal, hilar, or axillary lymph
nodes. Thyroid gland, trachea, and esophagus demonstrate no
significant findings.

Lungs/Pleura: Interval small amount of patchy opacity in the
posterior aspect of the right upper lobe. Also demonstrated is
elevation of the right hemidiaphragm with atelectasis of the
adjacent portions of the right lower lobe and right middle lobe.
Small amount of linear scarring in the superior aspect of the right
upper lobe. No pleural fluid.

Upper Abdomen: Elevated right hemidiaphragm with colon
interposition. 2.9 cm gallstone in the gallbladder and multiple
additional smaller gallstones. No gallbladder wall thickening or
pericholecystic fluid. The upper portion of an aortic stent is
included.

Musculoskeletal: Mild thoracic spine degenerative changes.
IMPRESSION: 1. Mild right upper lobe pneumonia.
2. Elevated right hemidiaphragm with compressive atelectasis in the
adjacent right lower lobe and right middle lobe.
3.  Calcific coronary artery and aortic atherosclerosis.
4. Enlarged central pulmonary arteries, compatible with pulmonary
arterial hypertension.
5. Cholelithiasis.

Aortic Atherosclerosis ([id number].X).

## 2019-10-25 IMAGING — DX DG WRIST COMPLETE 3+V*L*
4 series · 4 of 4 positions shown · non-contrast
Comparison: 03/13/2017

CLINICAL DATA: LEFT wrist swelling post fall

EXAM:
LEFT WRIST - COMPLETE 3+ VIEW

[wrist ap]
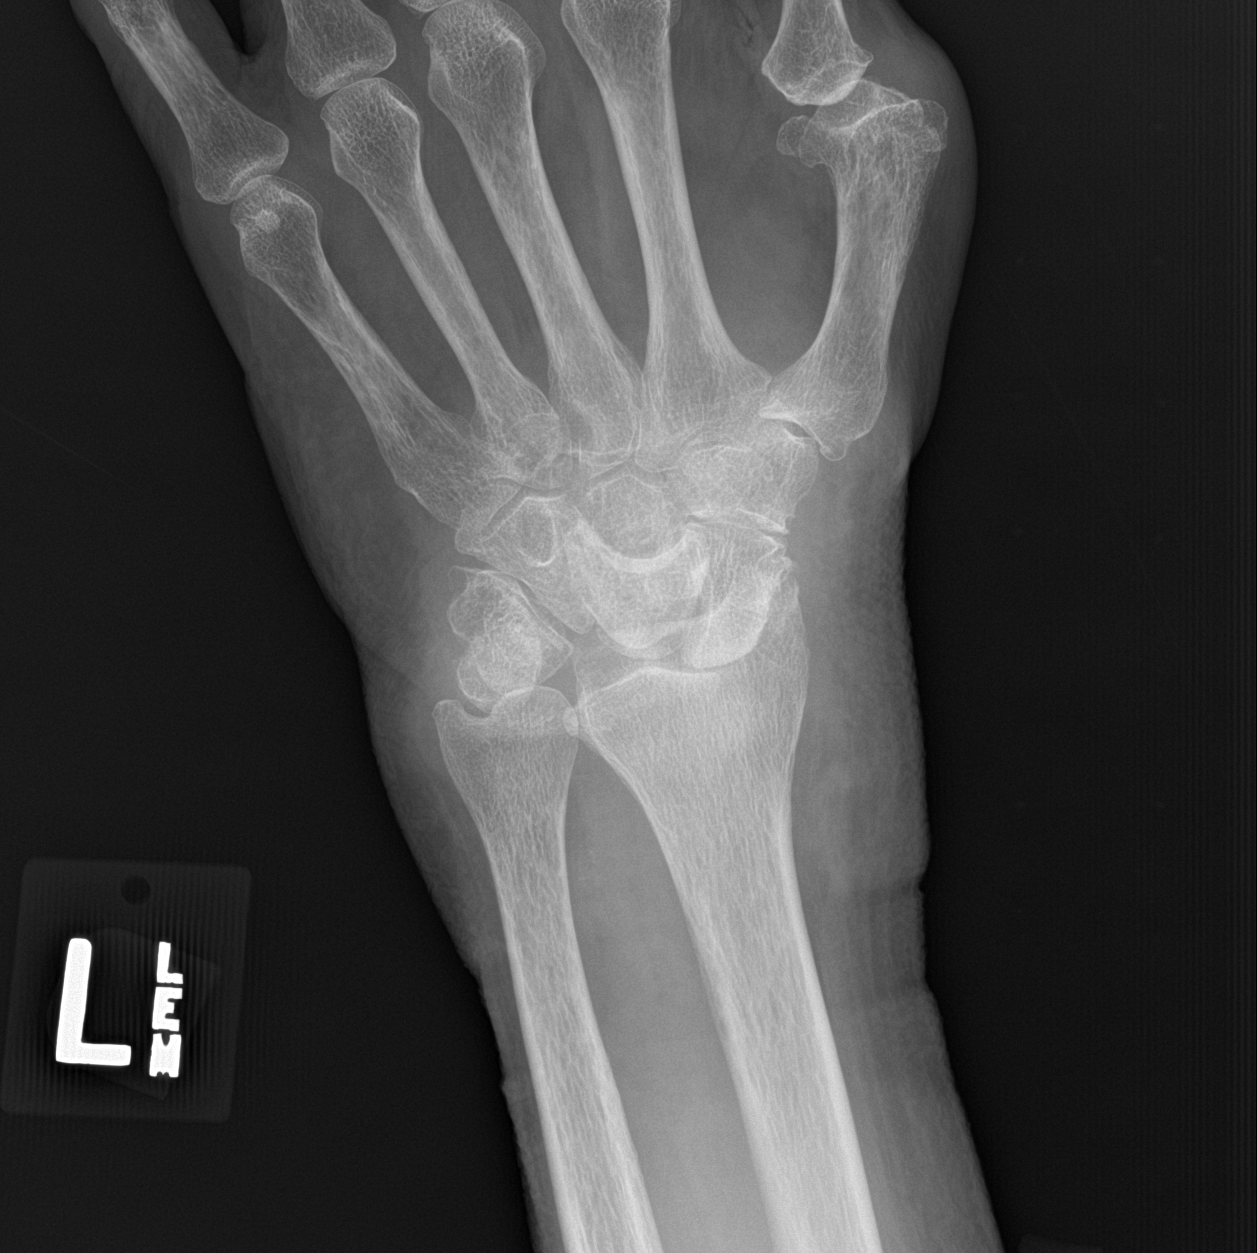

[wrist obl]
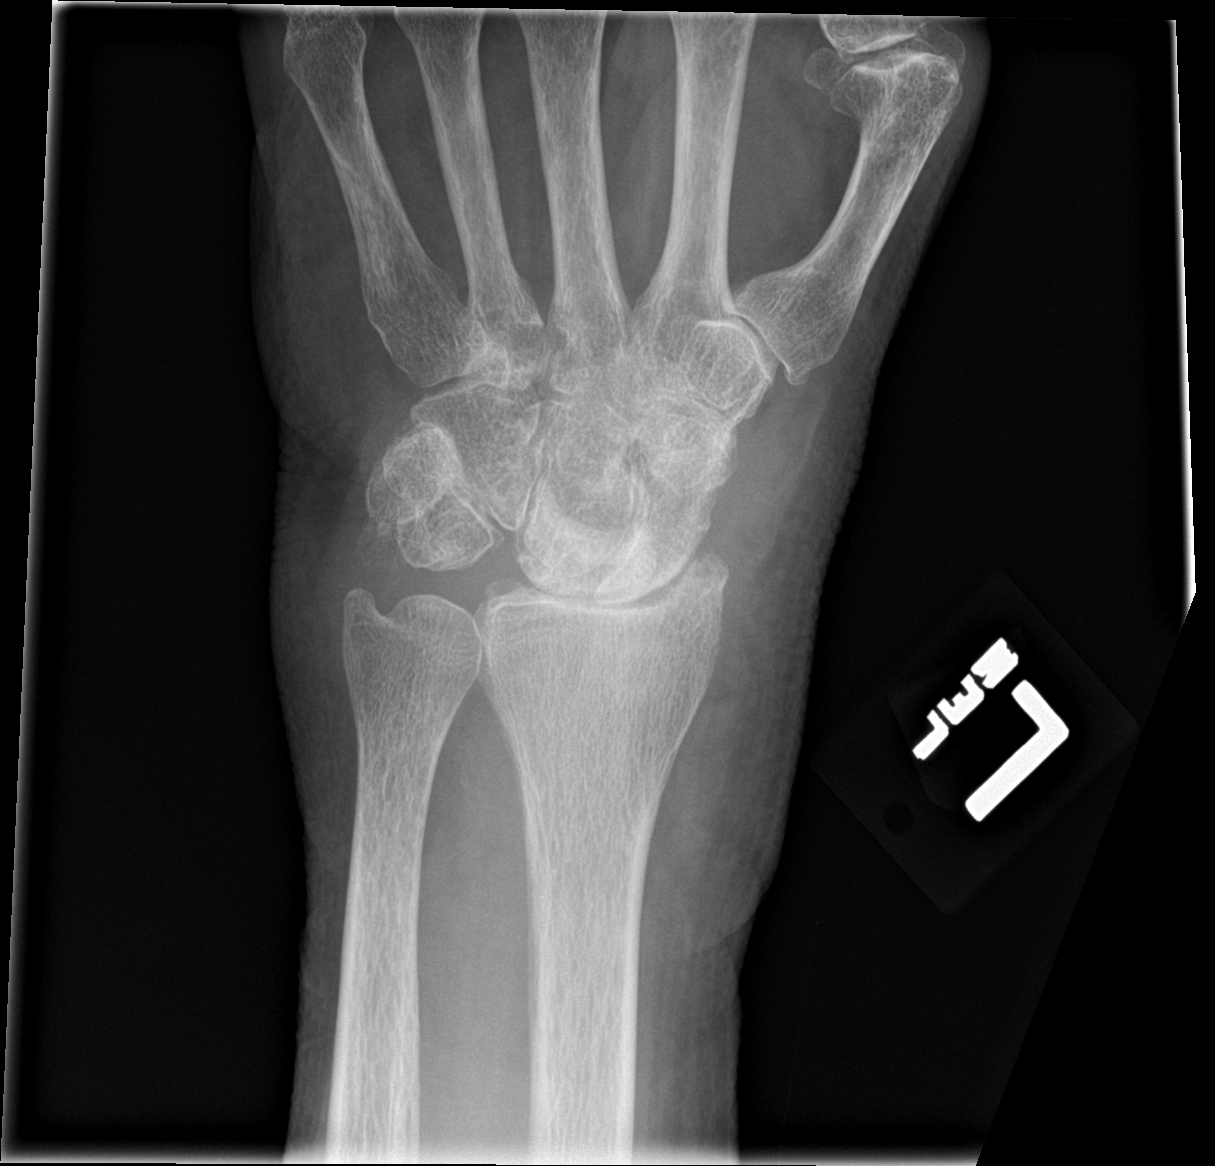

[wrist lat (1 of 2)]
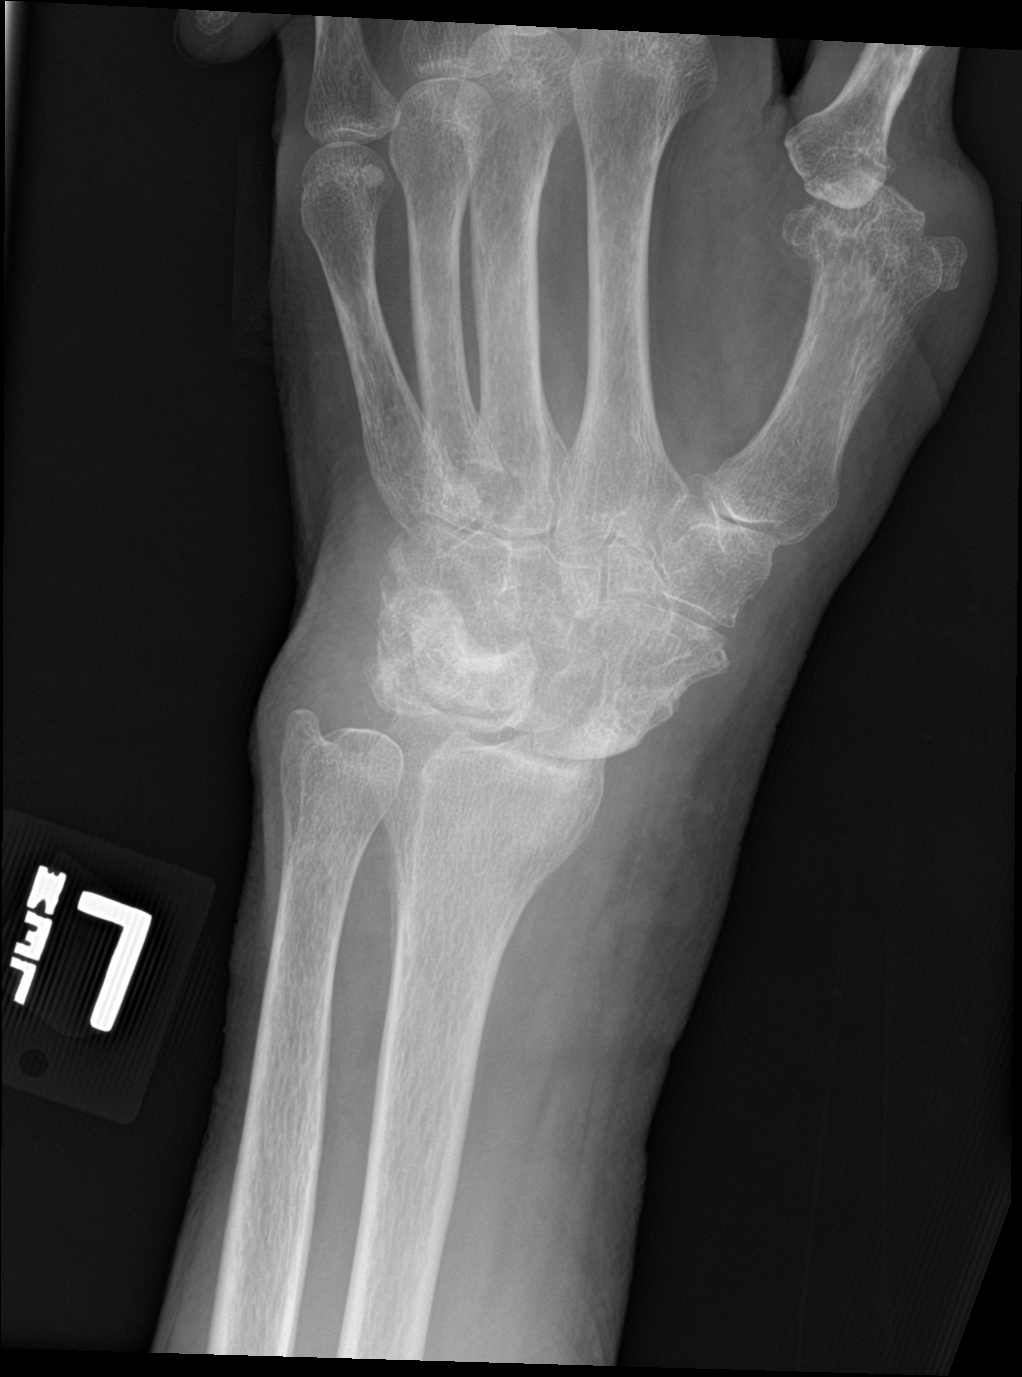

[wrist lat (2 of 2)]
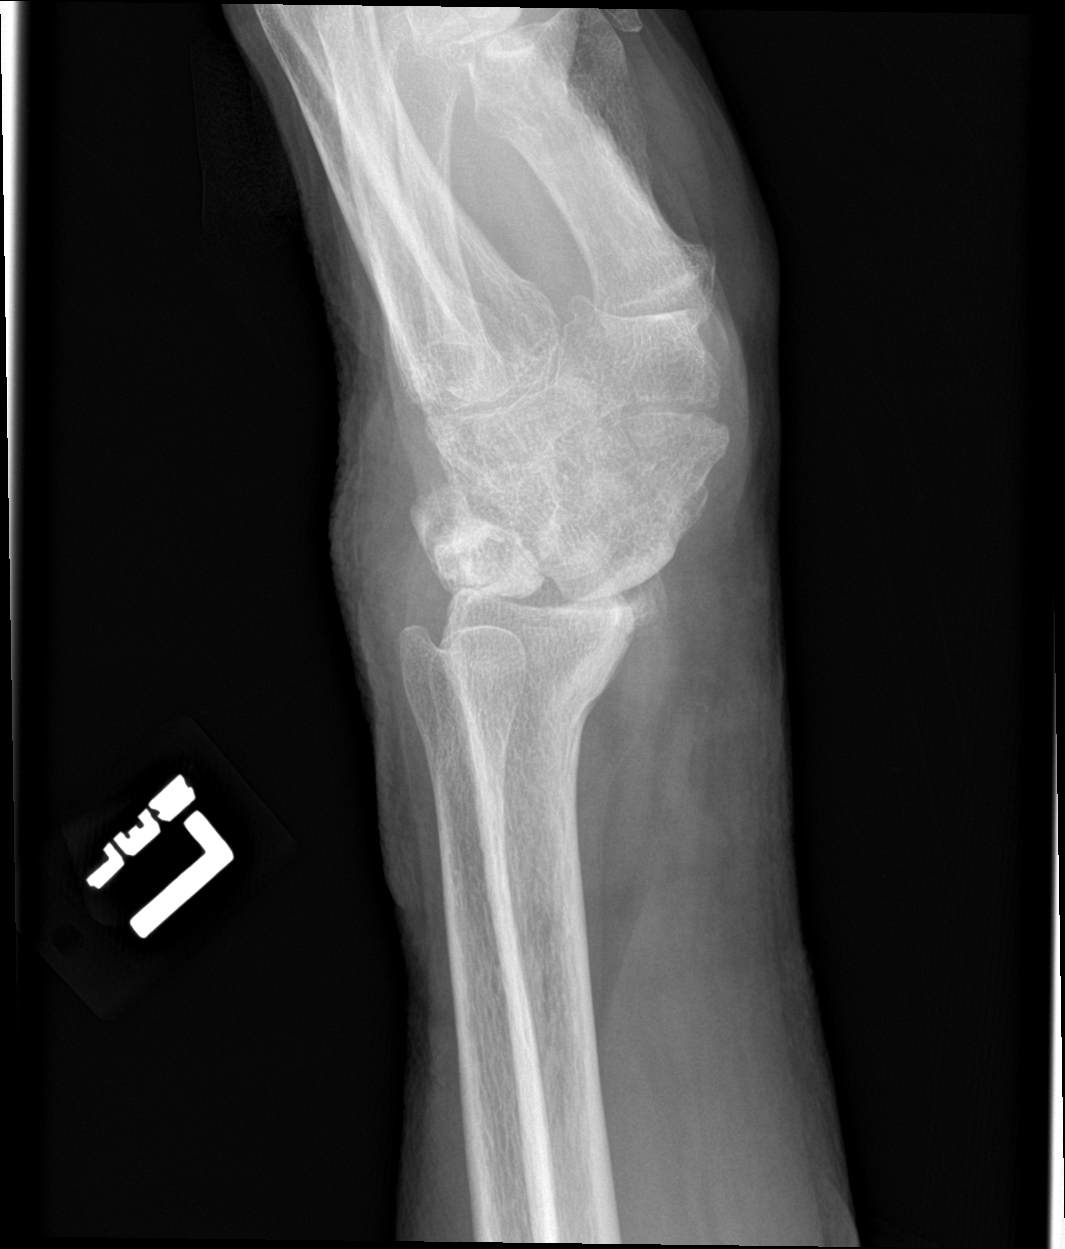

[4 of 4 positions shown; findings below may reference images not displayed]

FINDINGS: Osseous demineralization.

Radiocarpal and midcarpal joint space narrowing.

Chronic dorsal dislocation of the lunate.

Extensive soft tissue swelling at wrist.

No acute fracture, additional dislocation or bone destruction.

Spur formation at the volar margin of the radiocarpal joint.
IMPRESSION: Chronic dorsal lunate dislocation.

Extensive degenerative changes and osseous demineralization.

No definite acute fracture identified.

## 2019-10-25 IMAGING — CT CT ABD-PELV W/ CM
3 of 11 series · 11 of 46 positions shown, 17 images · IV contrast (APPLIED)
Comparison: 10/09/2017 chest CT, 04/16/2015 abdomen/pelvic CT and
other studies

CLINICAL DATA: 72-year-old male with acute shortness of breath,
recent fall, hypoxia and abdominal and pelvic discomfort.

EXAM:
CT ANGIOGRAPHY CHEST
CT ABDOMEN AND PELVIS WITH CONTRAST
TECHNIQUE: Multidetector CT imaging of the chest was performed using the
standard protocol during bolus administration of intravenous
contrast. Multiplanar CT image reconstructions and MIPs were
obtained to evaluate the vascular anatomy. Multidetector CT imaging
of the abdomen and pelvis was performed using the standard protocol
during bolus administration of intravenous contrast.
CONTRAST:  75mL OMNIPAQUE IOHEXOL 350 MG/ML SOLN

[Series 6: thins · axial · 0.68mm/px · z∈[-767,-627]mm · 6 of 246 slices shown]
[im 18/246  soft-tissue]
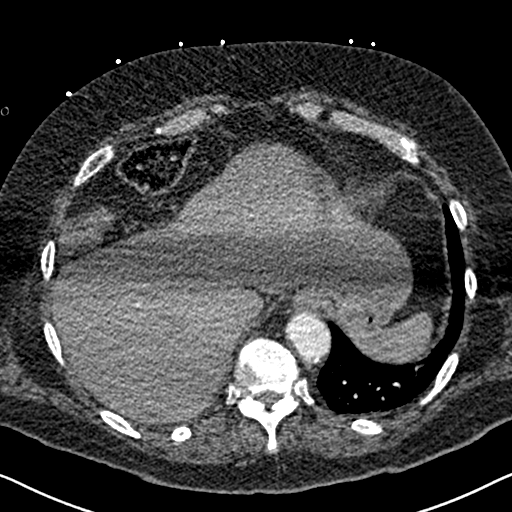
[im 53/246  soft-tissue]
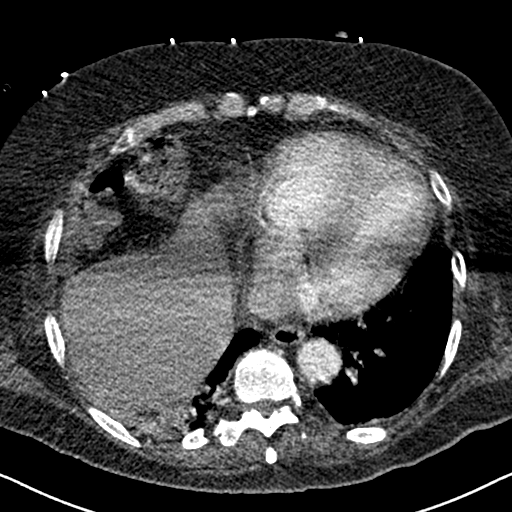
[im 88/246  soft-tissue]
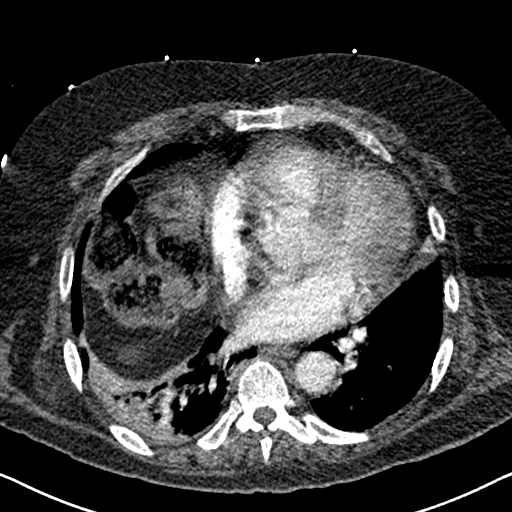
[im 106/246  soft-tissue]
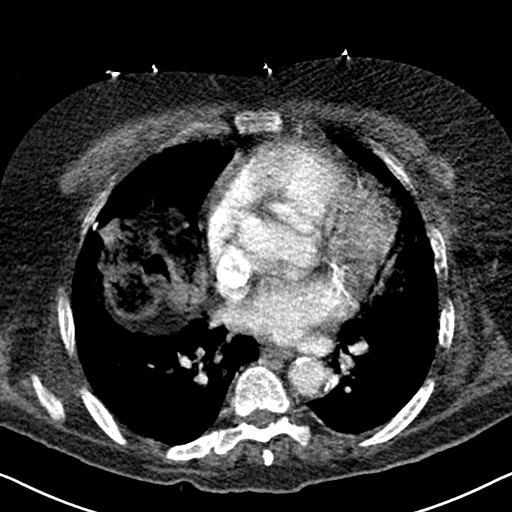
[im 141/246  soft-tissue]
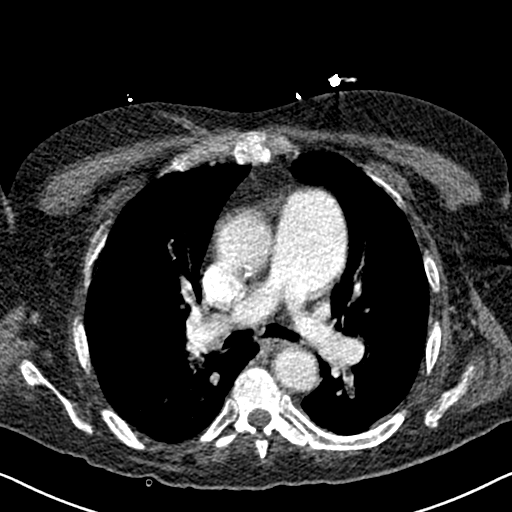
[im 158/246  soft-tissue]
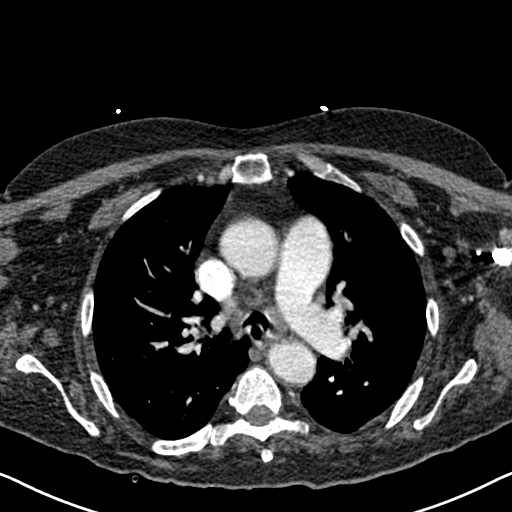

[Series 8: coronal mpr · coronal · 0.57mm/px · 1 of 121 slices shown, 2 images]
[im 61/121  soft-tissue]
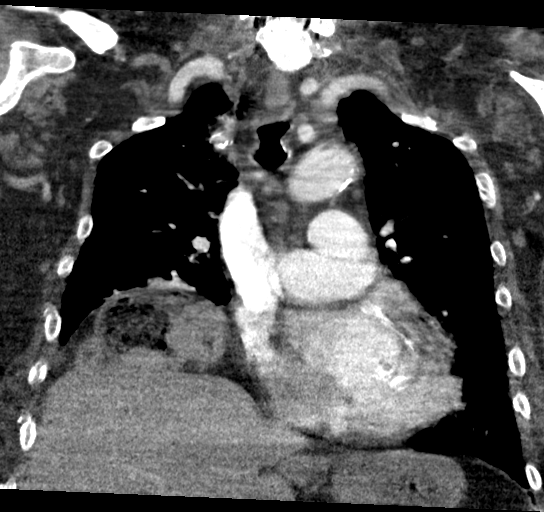
[im 61/121  bone]
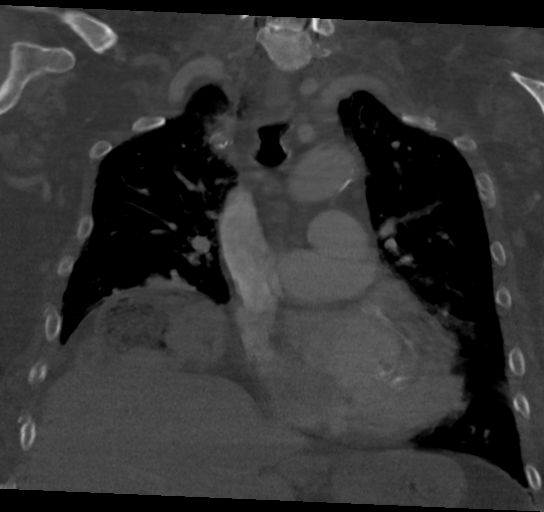

[Series 12: axial st · axial · 0.78mm/px · z∈[-1096,-781]mm · 4 of 105 slices shown, 9 images]
[im 21/105  soft-tissue]
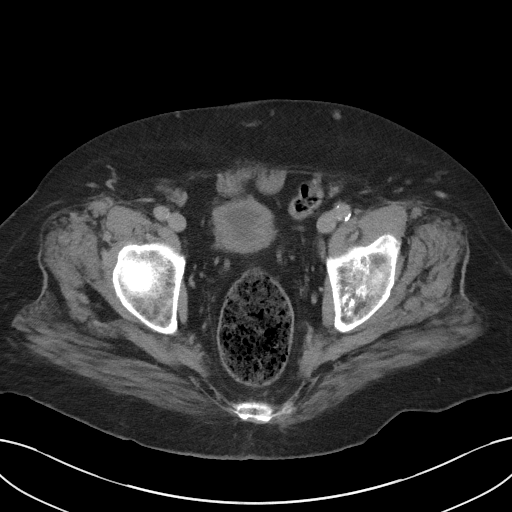
[im 21/105  lung]
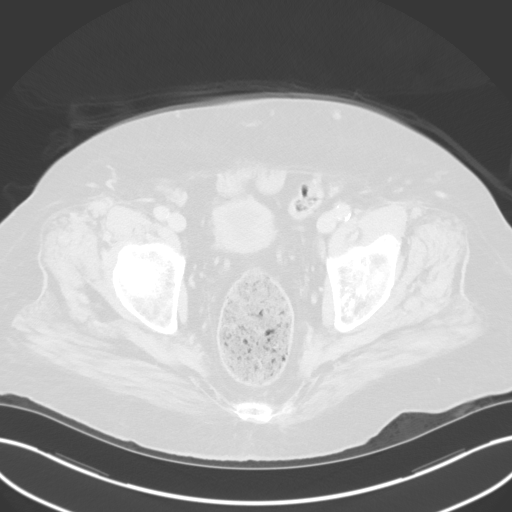
[im 21/105  bone]
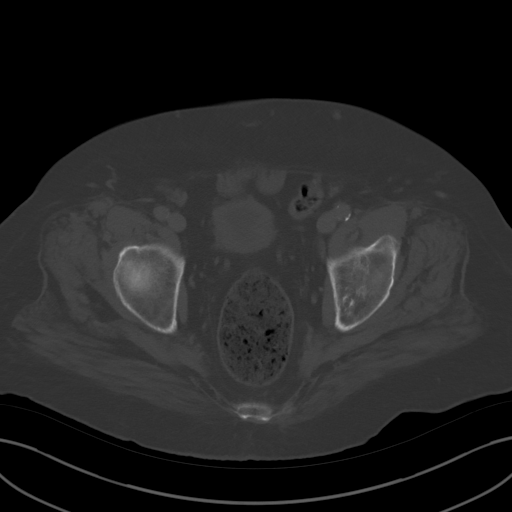
[im 42/105  soft-tissue]
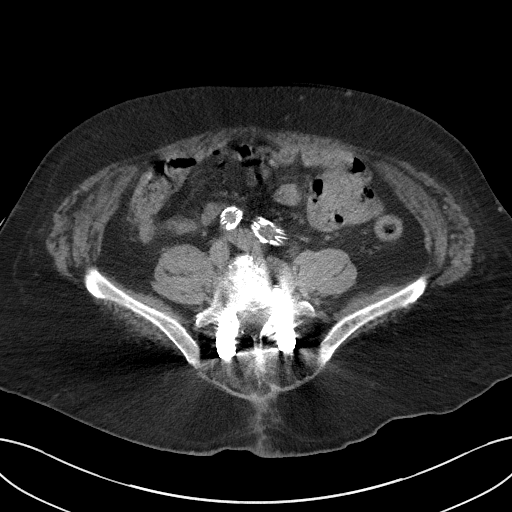
[im 42/105  lung]
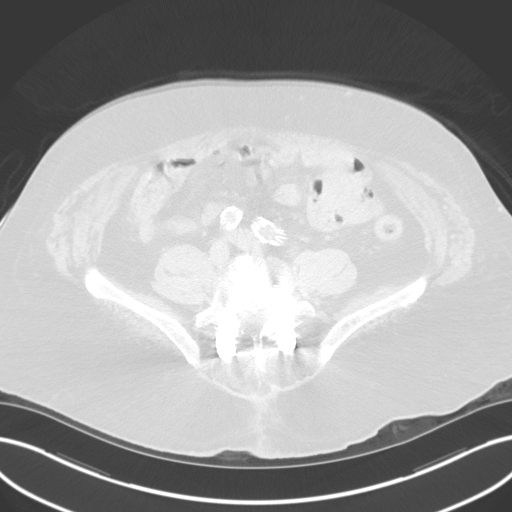
[im 63/105  soft-tissue]
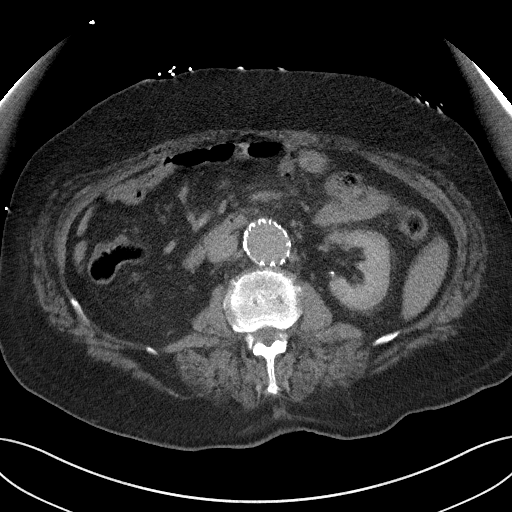
[im 63/105  lung]
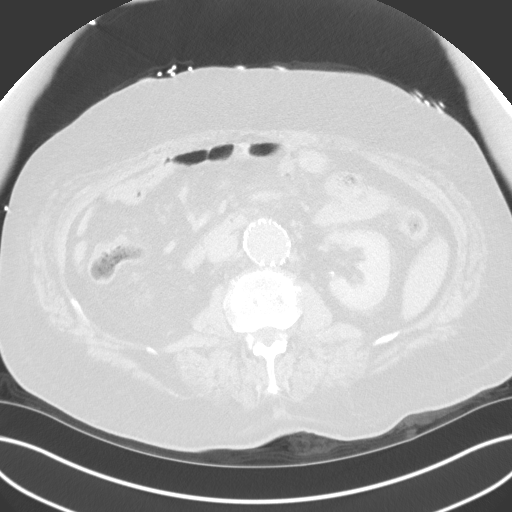
[im 84/105  soft-tissue]
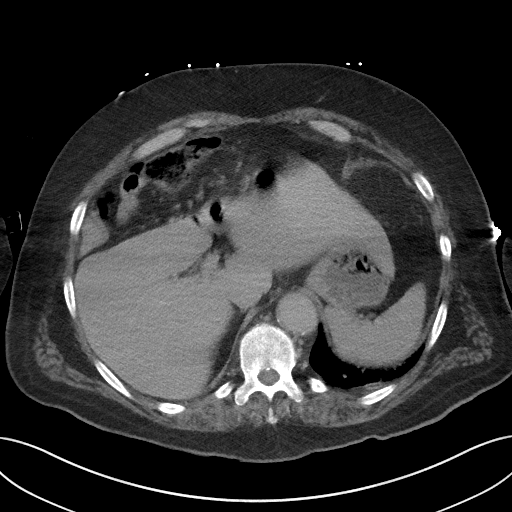
[im 84/105  lung]
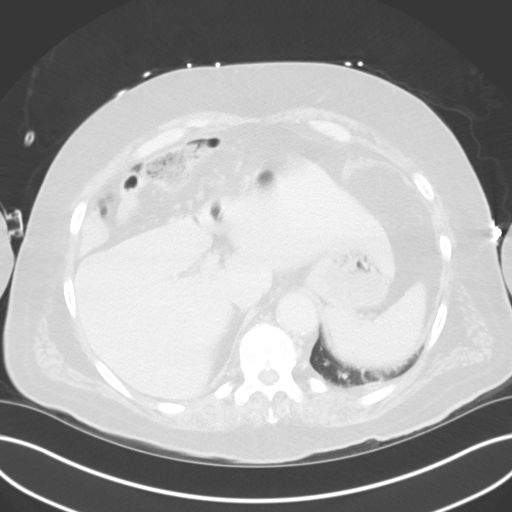

[11 of 46 positions shown; findings below may reference images not displayed]

FINDINGS: CTA CHEST FINDINGS

Cardiovascular: This is a technically borderline study due to
respiratory motion artifact and borderline contrast opacification of
the pulmonary arteries.

No central, large or definite pulmonary emboli identified. Prominent
main pulmonary artery again noted.

Cardiomegaly identified without pericardial effusion. Coronary
artery and aortic atherosclerotic calcification noted without aortic
aneurysm.

Mediastinum/Nodes: No enlarged mediastinal, hilar, or axillary lymph
nodes. Thyroid gland, trachea, and esophagus demonstrate no
significant findings.

Lungs/Pleura: RIGHT LOWER lobe scarring/atelectasis again noted. No
new pulmonary opacities are identified. No pleural effusion or
pneumothorax.

Musculoskeletal: No acute or suspicious bony abnormality identified.
Posterior fusion hardware in the cervical/thoracic spine again
noted.

Review of the MIP images confirms the above findings.

CT ABDOMEN and PELVIS FINDINGS

Hepatobiliary: No hepatic abnormalities identified. Cholelithiasis
again identified without CT evidence of acute cholecystitis. No
biliary dilatation.

Pancreas: Mildly atrophic without other significant abnormality

Spleen: Unremarkable

Adrenals/Urinary Tract: The kidneys and adrenal glands are
unremarkable. Mild circumferential wall thickening of the bladder
again noted. No hydronephrosis or urinary calculi.

Stomach/Bowel: Stomach is within normal limits. No evidence of bowel
wall thickening, distention, or inflammatory changes. Colonic
diverticulosis noted without evidence of diverticulitis. A moderate
to large amount of stool within the distal sigmoid colon and rectum
noted.

Vascular/Lymphatic: Aorto bi-iliac endograft appears unchanged.
Abdominal aorta again measures 3.5 cm in greatest diameter. No
enlarged lymph nodes identified.

Reproductive: Prostate calcifications again noted.

Other: No free fluid, abscess or pneumoperitoneum.

Musculoskeletal: No acute or suspicious bony abnormalities
identified. Posterior fusion changes at L4-L5 again noted.

Review of the MIP images confirms the above findings.
IMPRESSION: 1. No evidence of acute abnormality. No pulmonary emboli identified
but the study is technically borderline.
2. Cholelithiasis without CT evidence of acute cholecystitis.
3. Aorto bi-iliac endograft again noted without change.
4. Continued RIGHT LOWER lobe atelectasis/scarring.
5. Cardiomegaly and prominent main pulmonary artery/pulmonary
arterial hypertension
6. Coronary artery and aortic Atherosclerosis ([id number]).

## 2019-10-25 IMAGING — DX DG FOOT COMPLETE 3+V*L*
3 series · 3 of 3 positions shown · non-contrast
Comparison: 10/08/2013

CLINICAL DATA: LEFT foot and LEFT wrist swelling after fall, LEFT
foot bruising

EXAM:
LEFT FOOT - COMPLETE 3+ VIEW

[foot ap]
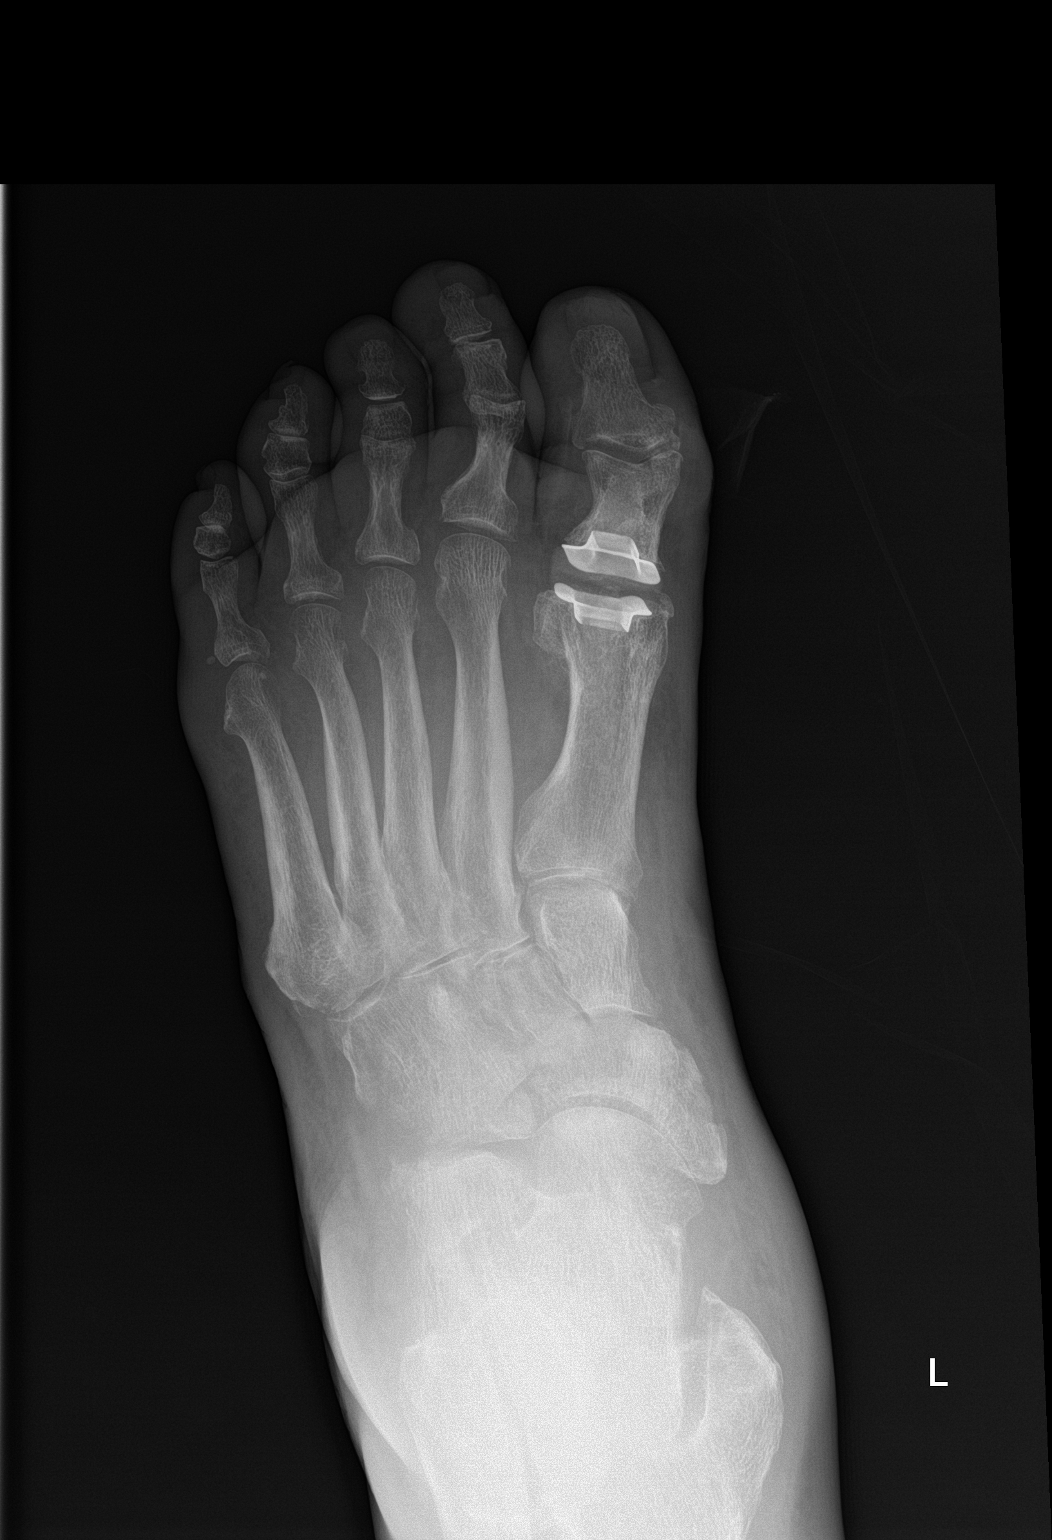

[foot obl]
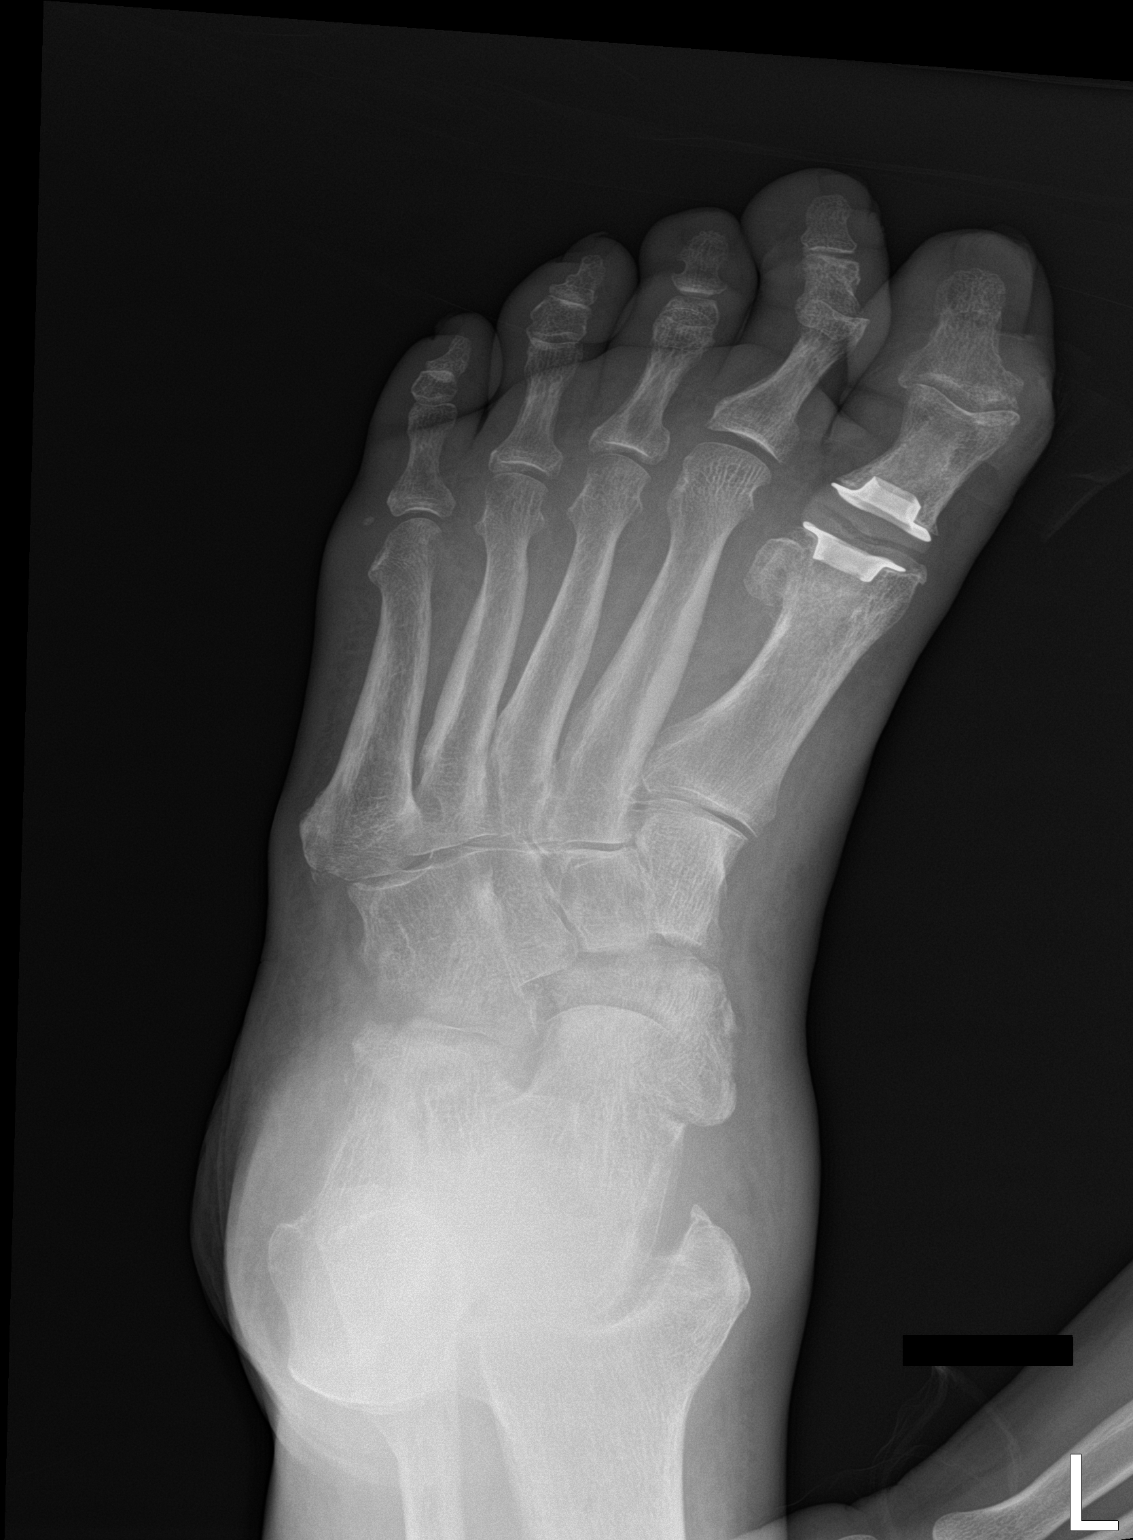

[foot lat]
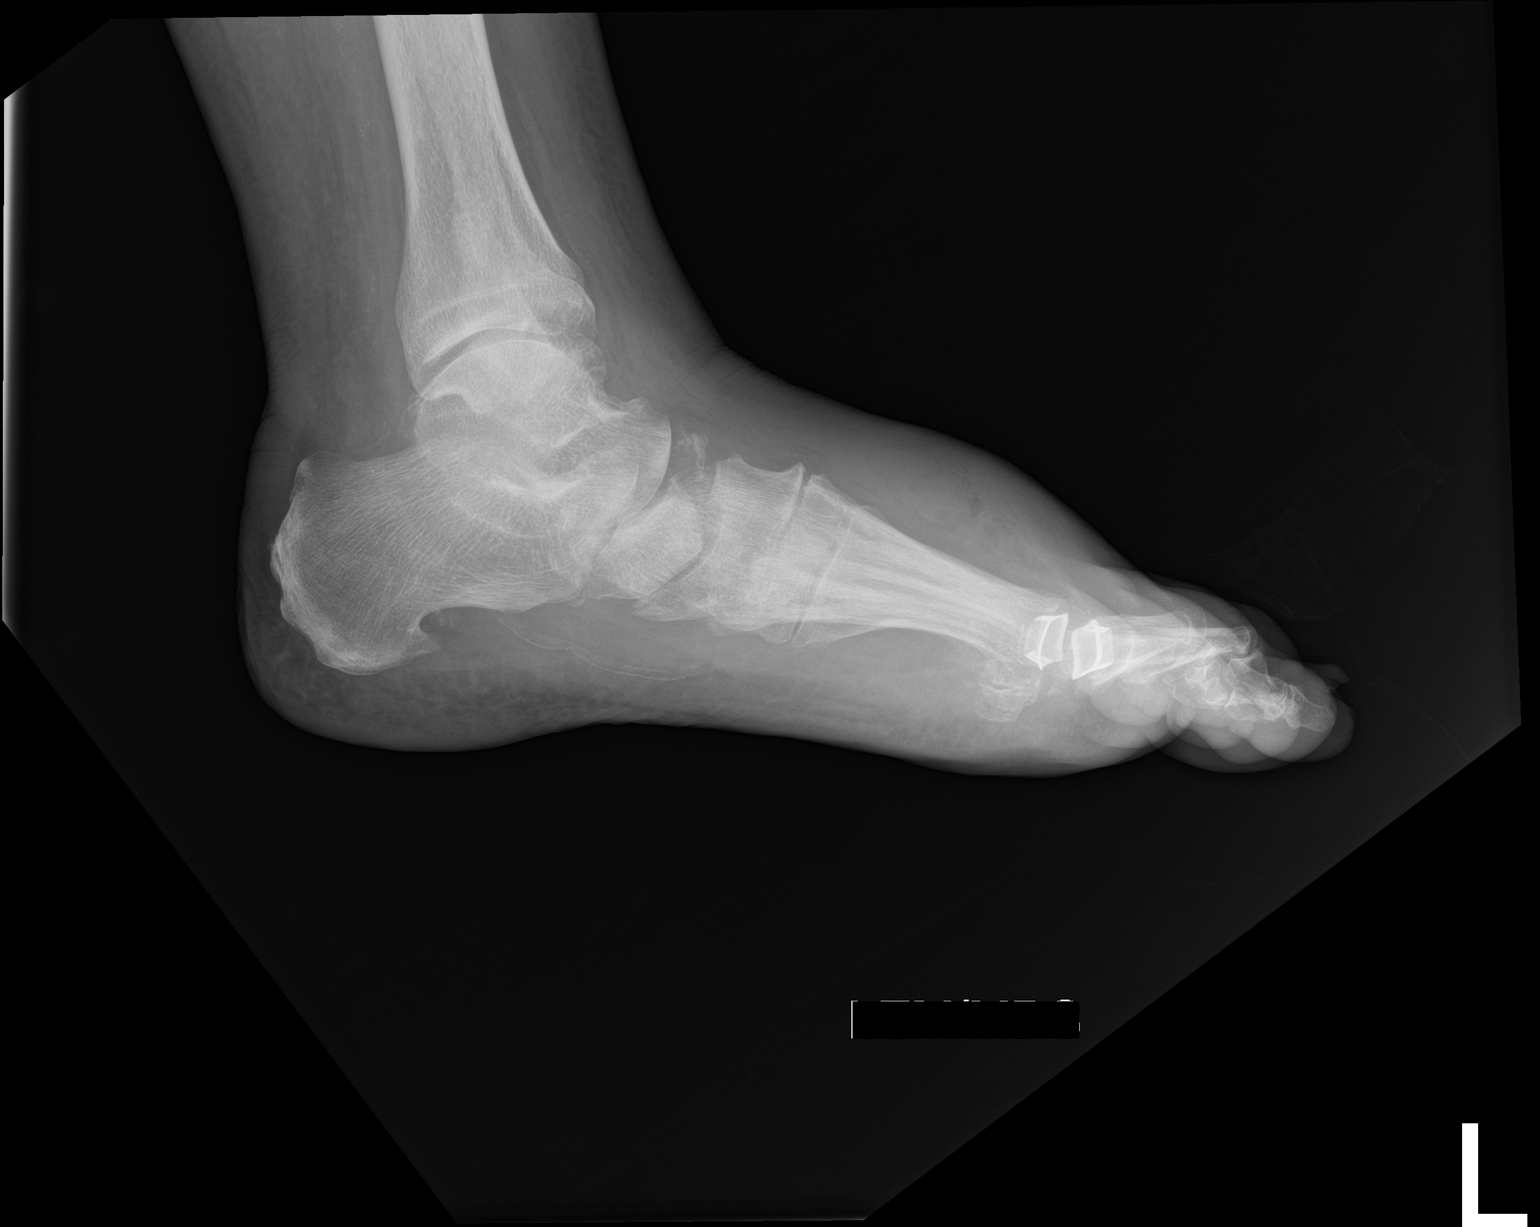

[3 of 3 positions shown; findings below may reference images not displayed]

FINDINGS: Marked dorsal soft tissue swelling LEFT foot.

Osseous demineralization.

Prior joint replacement surgery at first MTP joint.

Small bone fragments are identified at the expected position of the
dorsal margin of the tarsal navicular.

Tarsal navicular however is dislocated inferiorly.

In addition, the proximal margin of the cuboid is poorly defined and
questionably destroyed versus fractured though no definite bone
fragment is seen.

These changes could reflect traumatic injuries at the midfoot or
Charcot changes.

No additional fracture or dislocation identified.
IMPRESSION: Inferior dislocation of the tarsal navicular at the talonavicular
joint with small bone fragments at the cranial margin question
avulsion injury at the talonavicular ligament/capsular insertion.

Questionable destruction or fracture at the proximal margin of the
cuboid laterally.

These changes could reflect acute trauma or Charcot changes; further
evaluation by CT recommended.

## 2019-10-25 IMAGING — CT CT FOOT*L* W/O CM
1 of 3 series · 5 of 14 positions shown, 7 images · non-contrast
Comparison: Radiographs dated 02/23/2018 and 10/08/2013 and
03/03/2016

CLINICAL DATA: Foot swelling.  Possible fall today.

EXAM:
CT OF THE LEFT FOOT WITHOUT CONTRAST
TECHNIQUE: Multidetector CT imaging of the left foot was performed according to
the standard protocol. Multiplanar CT image reconstructions were
also generated.

[Series 7: axial st · oblique · 0.28mm/px · 5 of 234 slices shown, 7 images]
[im 39/234  soft-tissue]
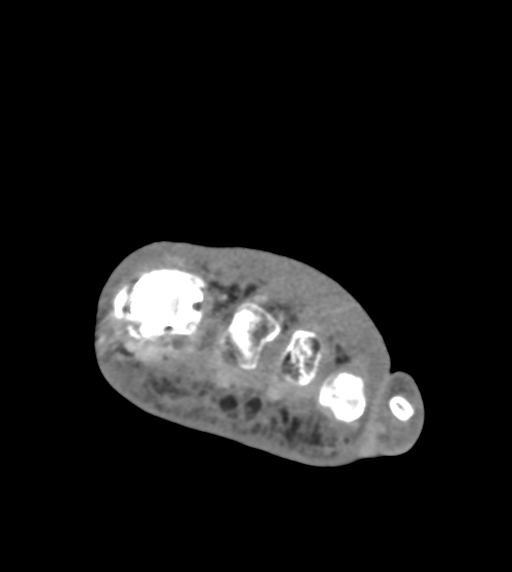
[im 39/234  bone]
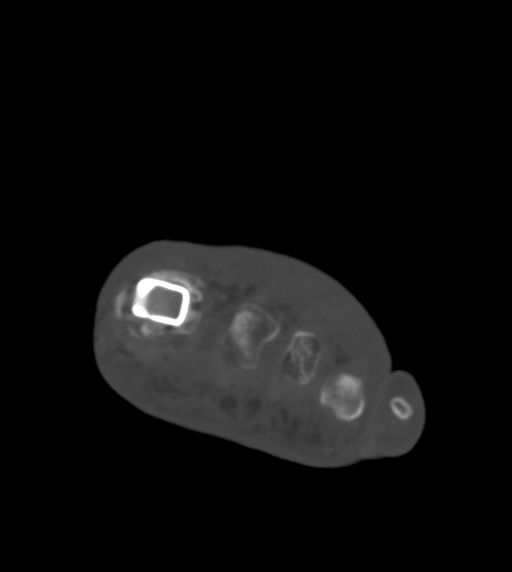
[im 78/234  bone]
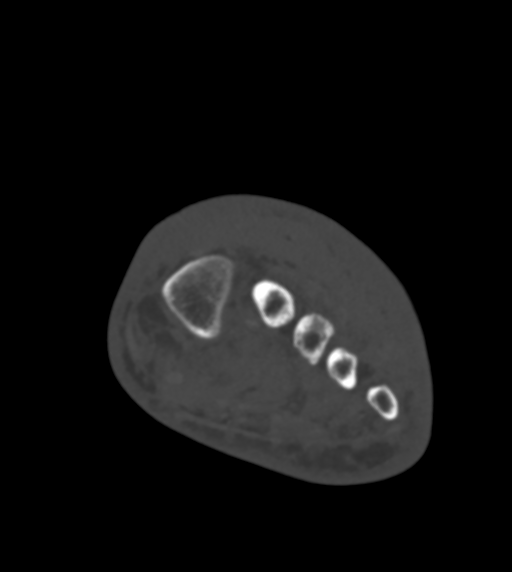
[im 117/234  bone]
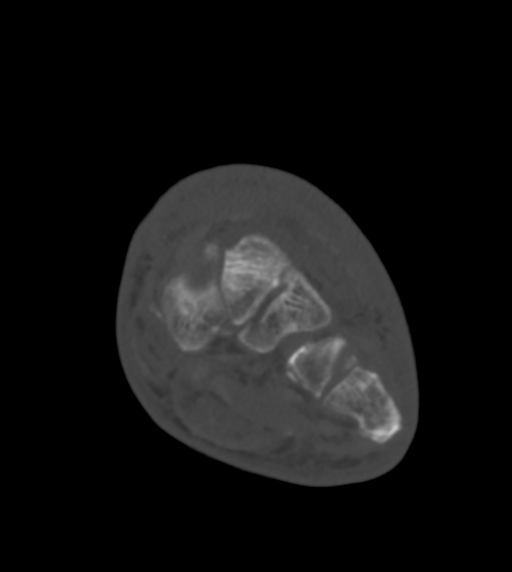
[im 156/234  bone]
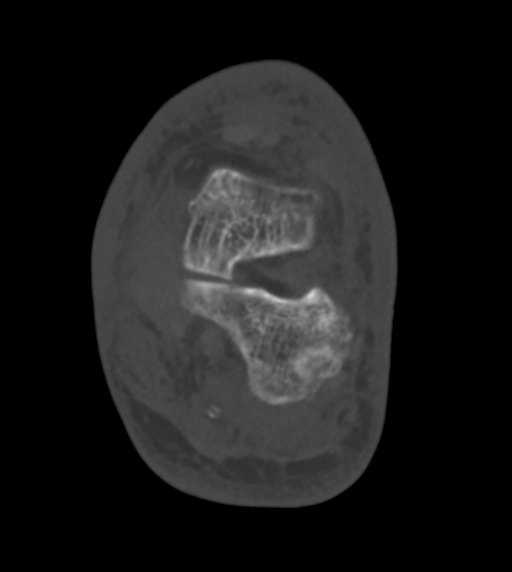
[im 195/234  soft-tissue]
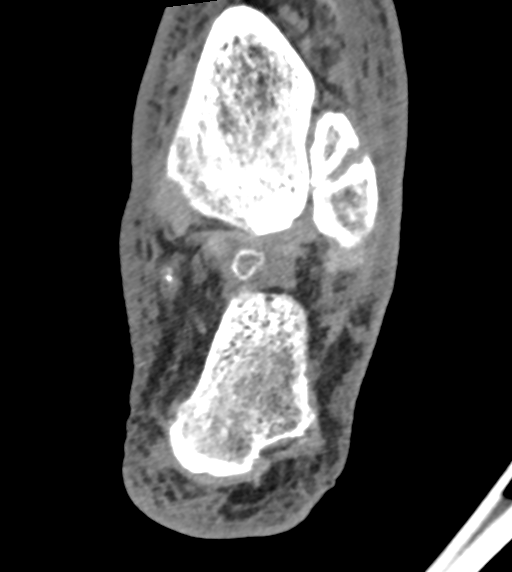
[im 195/234  bone]
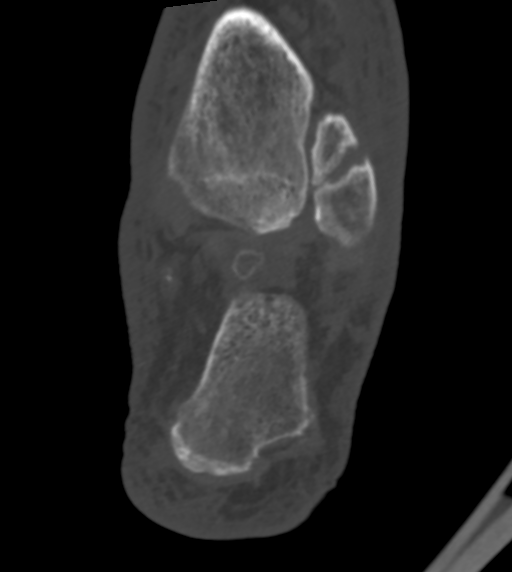

[5 of 14 positions shown; findings below may reference images not displayed]

FINDINGS: Bones/Joint/Cartilage

There is fragmentation and subluxation of the navicular as well as
fragmentation and subluxation of the cuboid at the calcaneocuboid
joint as well as fragmentation and impaction calcaneus at the
calcaneocuboid joint.

There is an old nonunion fracture of the distal fibula.

Patient has had a previous joint replacement at the first MTP joint.

Ligaments

Suboptimally assessed by CT. No discrete ligamentous disruption at
the ankle.

Muscles and Tendons

The tendons around the ankle are intact. Achilles tendon and plantar
fascia are intact.

Soft tissues

Arterial vascular calcifications in the foot and around the ankle.
Diffuse subcutaneous edema of the foot and ankle.
IMPRESSION: 1. The fragmentation and subluxations of the cuboid and navicular
are consistent with Charcot joints. I do not think that the findings
are consistent with osteomyelitis.
2. Some of the fragmentation/fractures could be acute or subacute
but the appearance is most consistent with chronic Charcot joints.
3. Diffuse subcutaneous edema is nonspecific.

## 2019-10-25 IMAGING — CT CT HEAD W/O CM
4 of 8 series · 16 of 47 positions shown, 18 images · non-contrast
Comparison: 10/04/2017

CLINICAL DATA: Altered LOC

EXAM:
CT HEAD WITHOUT CONTRAST
TECHNIQUE: Contiguous axial images were obtained from the base of the skull
through the vertex without intravenous contrast.

[Series 3: head wo · axial · 0.46mm/px · z∈[-145,-35]mm · 6 of 32 slices shown, 8 images (1 of 2)]
[im 5/32  brain]
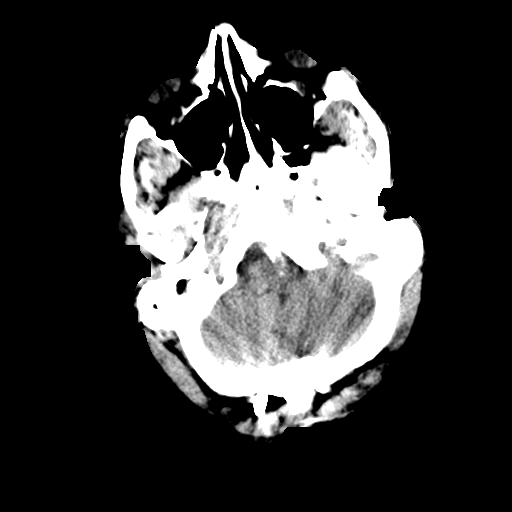
[im 5/32  bone]
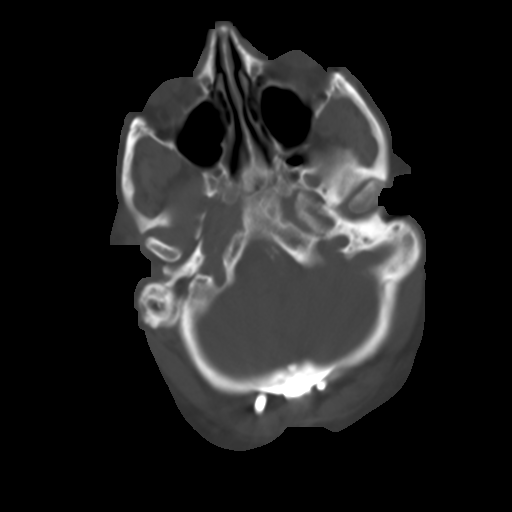
[im 9/32  brain]
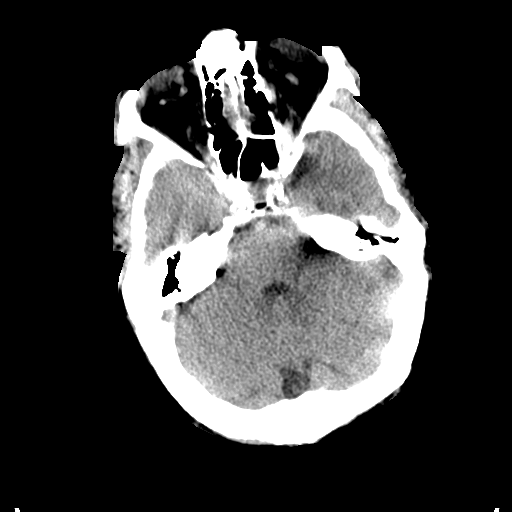
[im 14/32  brain]
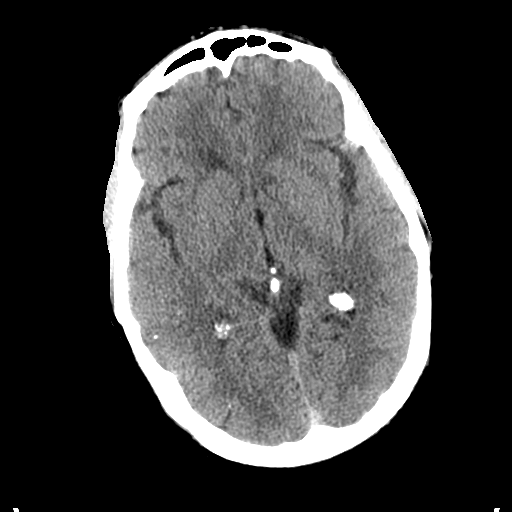
[im 18/32  brain]
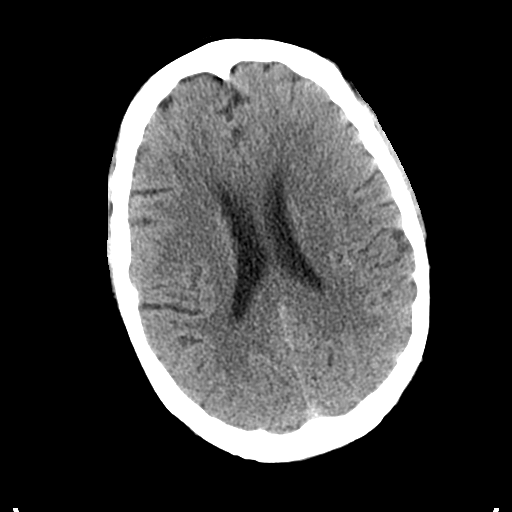
[im 23/32  brain]
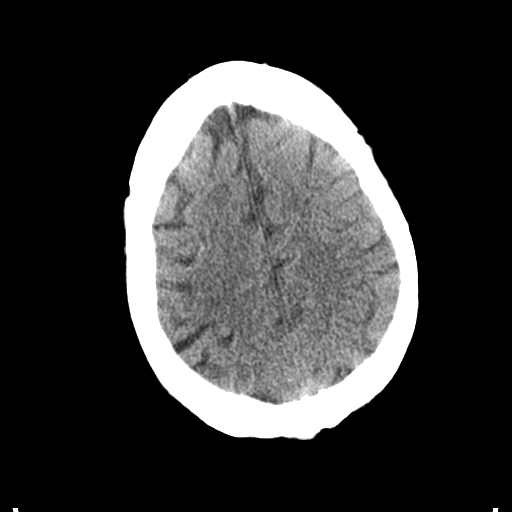
[im 23/32  bone]
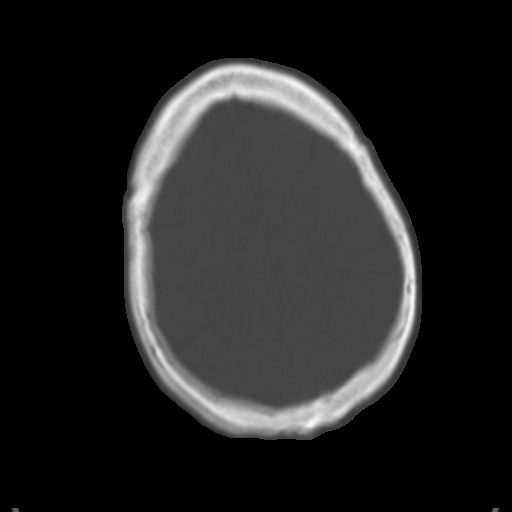
[im 27/32  brain]
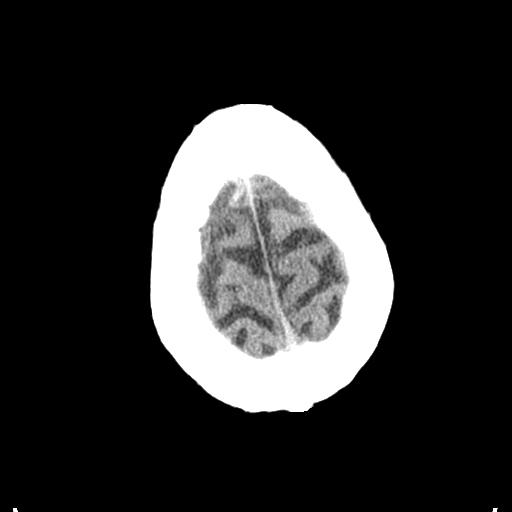

[Series 5: head wo · axial · 0.46mm/px · z∈[-145,-55]mm · 5 of 32 slices shown (2 of 2)]
[im 5/32  brain]
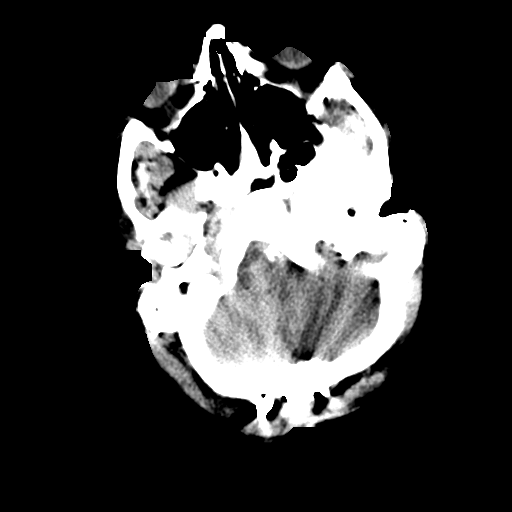
[im 9/32  brain]
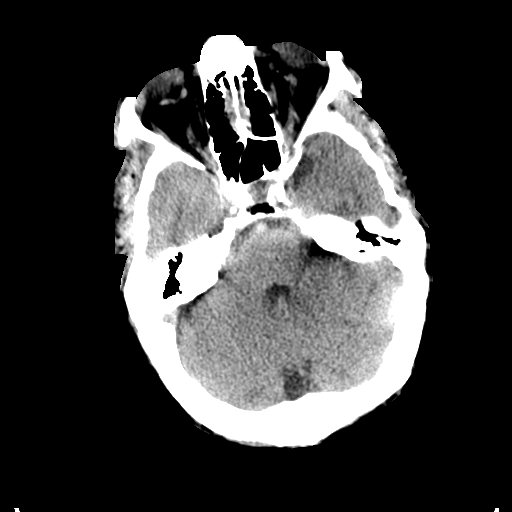
[im 14/32  brain]
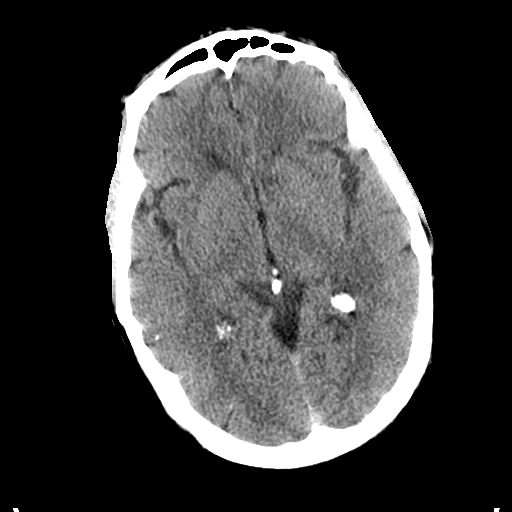
[im 18/32  brain]
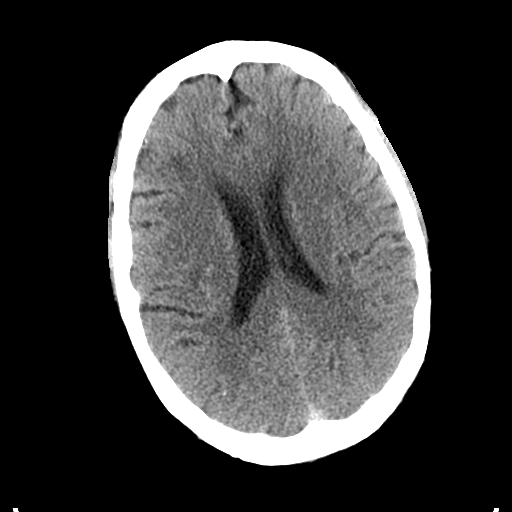
[im 23/32  brain]
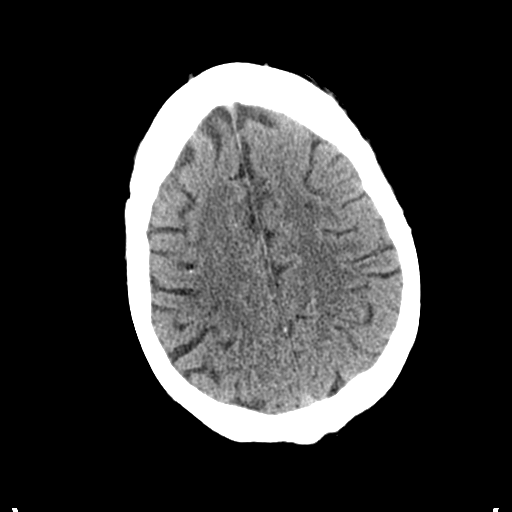

[Series 10: coronal soft tissue · coronal · 0.32mm/px · 3 of 68 slices shown]
[im 17/68  brain]
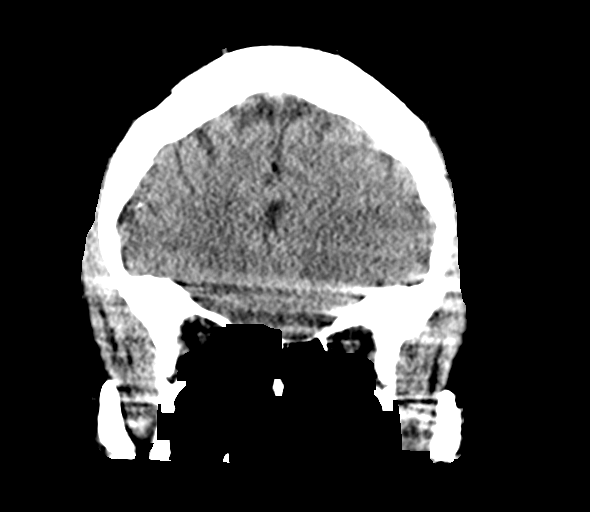
[im 34/68  brain]
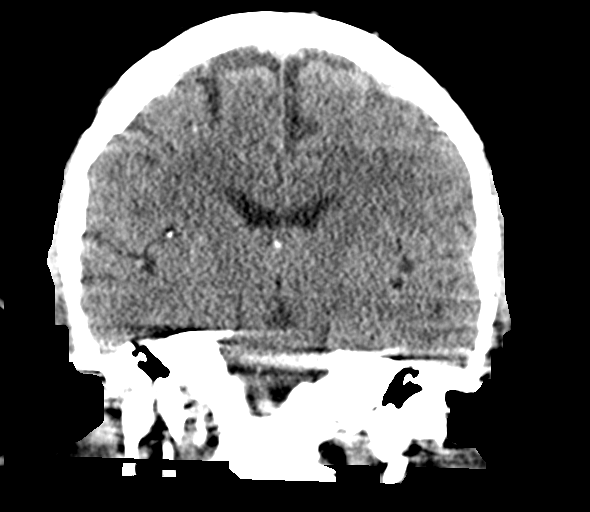
[im 51/68  brain]
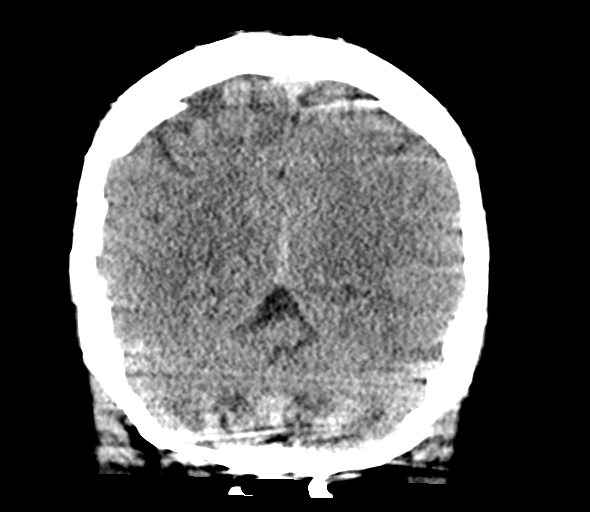

[Series 13: sagittal soft tissue · sagittal · 0.33mm/px · 2 of 54 slices shown]
[im 18/54  brain]
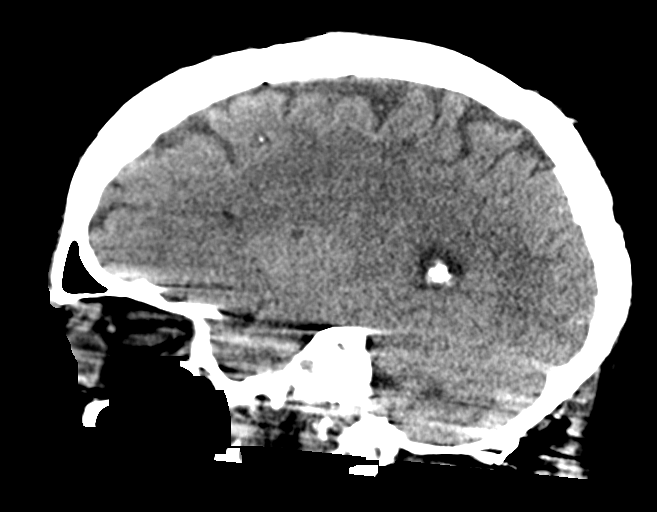
[im 36/54  brain]
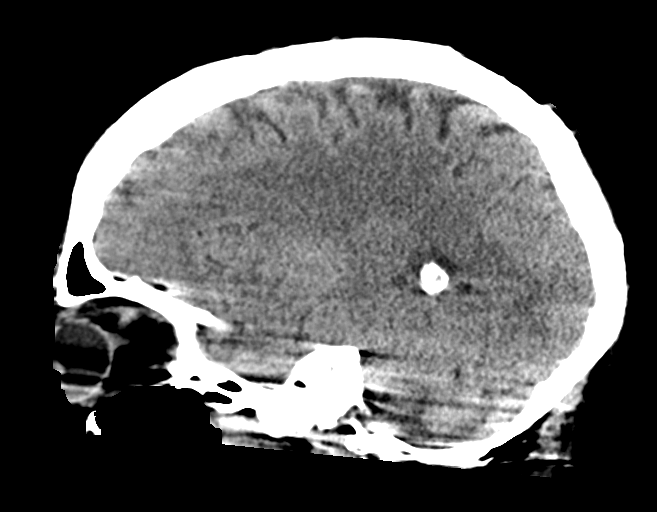

[16 of 47 positions shown; findings below may reference images not displayed]

FINDINGS: Brain: Motion degraded study. Artifact over the posterior fossa from
hardware. No acute territorial infarction, hemorrhage or
intracranial mass. Encephalomalacia in the right frontal lobe.
Atrophy. Mild small vessel ischemic changes of the white matter.
Probable old lacunar infarct in the left cerebellum.

Vascular: No hyperdense vessels. Carotid vascular and vertebral
artery calcification

Skull: No fracture

Sinuses/Orbits: No acute finding.

Other: None
IMPRESSION: 1. Motion degraded study. No CT evidence for acute intracranial
abnormality.
2. Old right frontal lobe infarct. Atrophy and mild small vessel
ischemic changes of the white matter

## 2019-10-25 IMAGING — DX DG CHEST 1V PORT
1 series · 1 of 1 positions shown · non-contrast
Comparison: 10/04/2017 chest radiograph.

CLINICAL DATA: Altered mental status

EXAM:
PORTABLE CHEST 1 VIEW

[chest ap]
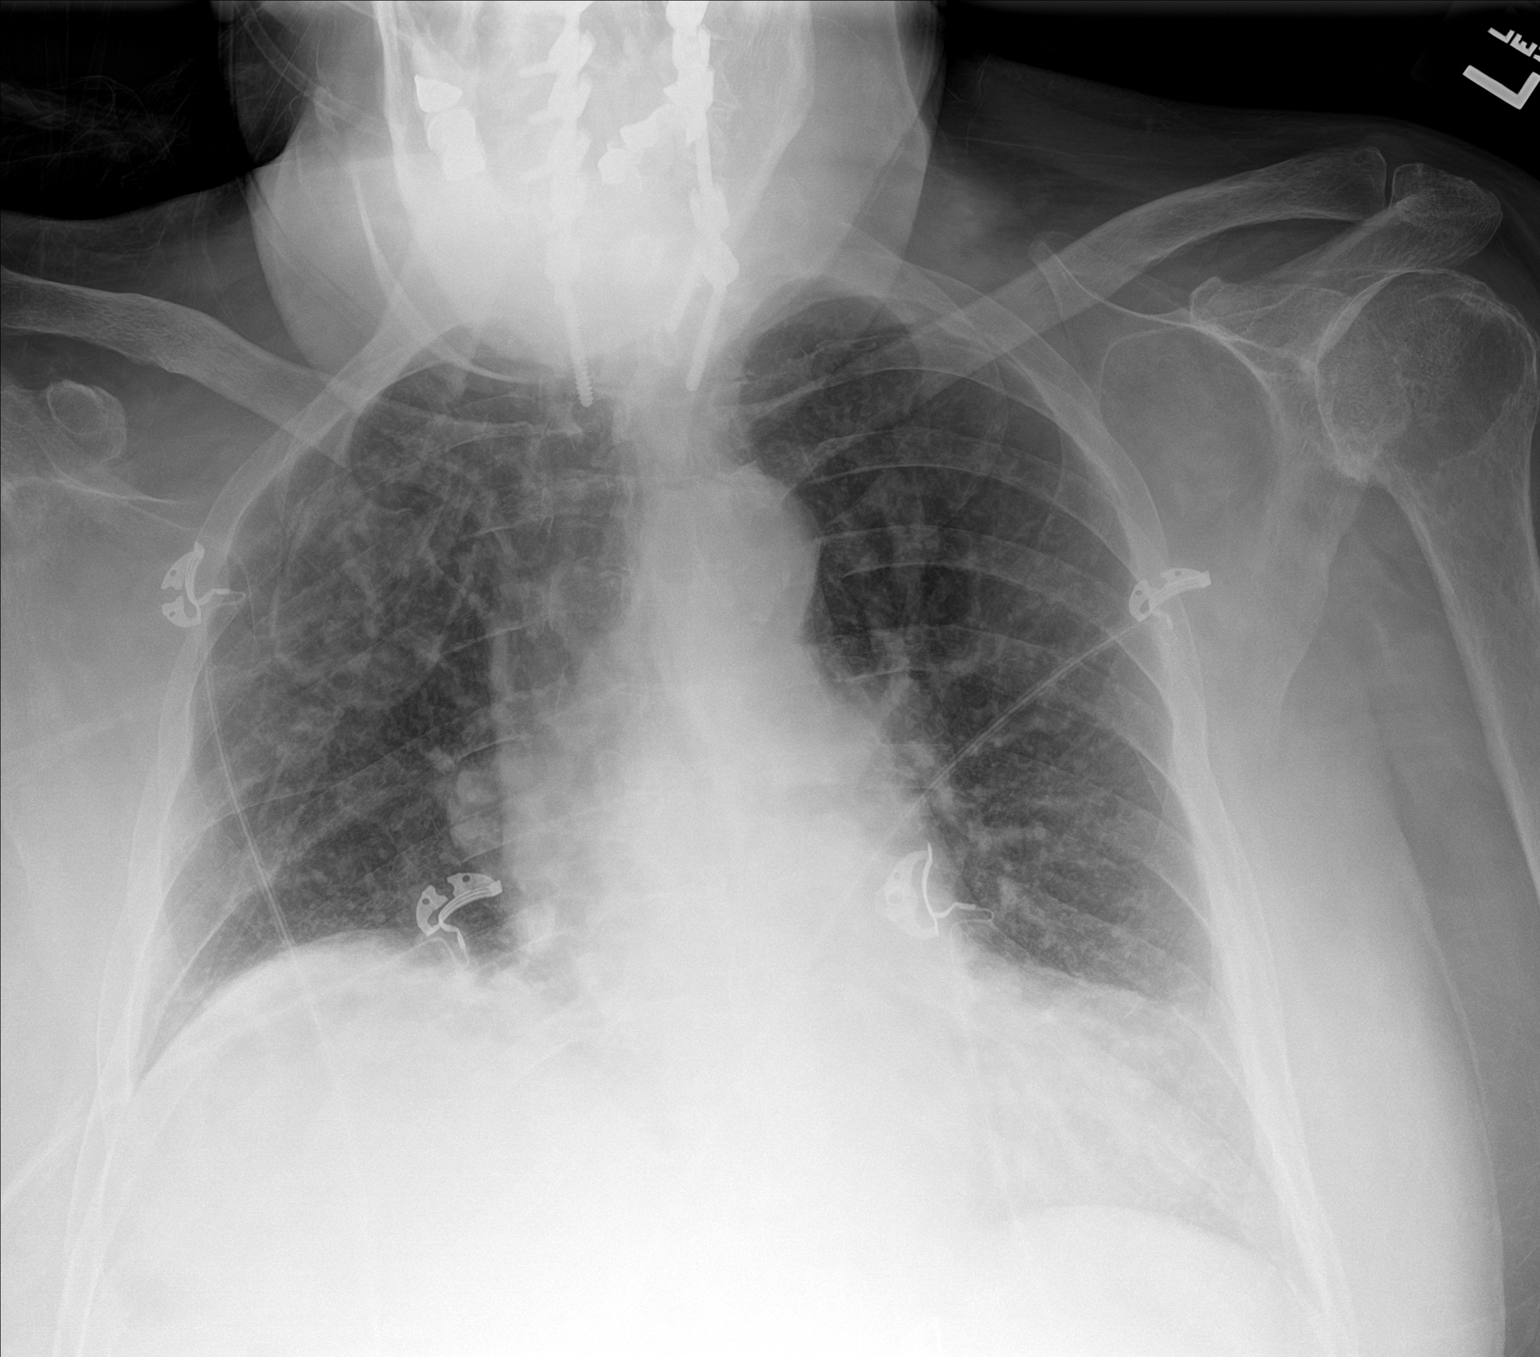

[1 of 1 positions shown; findings below may reference images not displayed]

FINDINGS: Surgical hardware overlying the cervical spine is partially
visualized. Stable cardiomediastinal silhouette with mild
cardiomegaly. No pneumothorax. No pleural effusion. Borderline mild
pulmonary edema. Stable elevation of the right hemidiaphragm with
mild right basilar scarring versus atelectasis.
IMPRESSION: 1. Borderline mild congestive heart failure.
2. Mild right basilar scarring versus atelectasis.

## 2019-10-27 IMAGING — CR DG KNEE COMPLETE 4+V*L*
1 series · 4 of 4 positions shown · non-contrast
Comparison: None.

CLINICAL DATA: Fall 2 days ago.

EXAM:
LEFT KNEE - COMPLETE 4+ VIEW

[Series 1: dg knee complete 4 views left · 0.14mm/px · 4 of 4 slices shown]
[im 1/4]
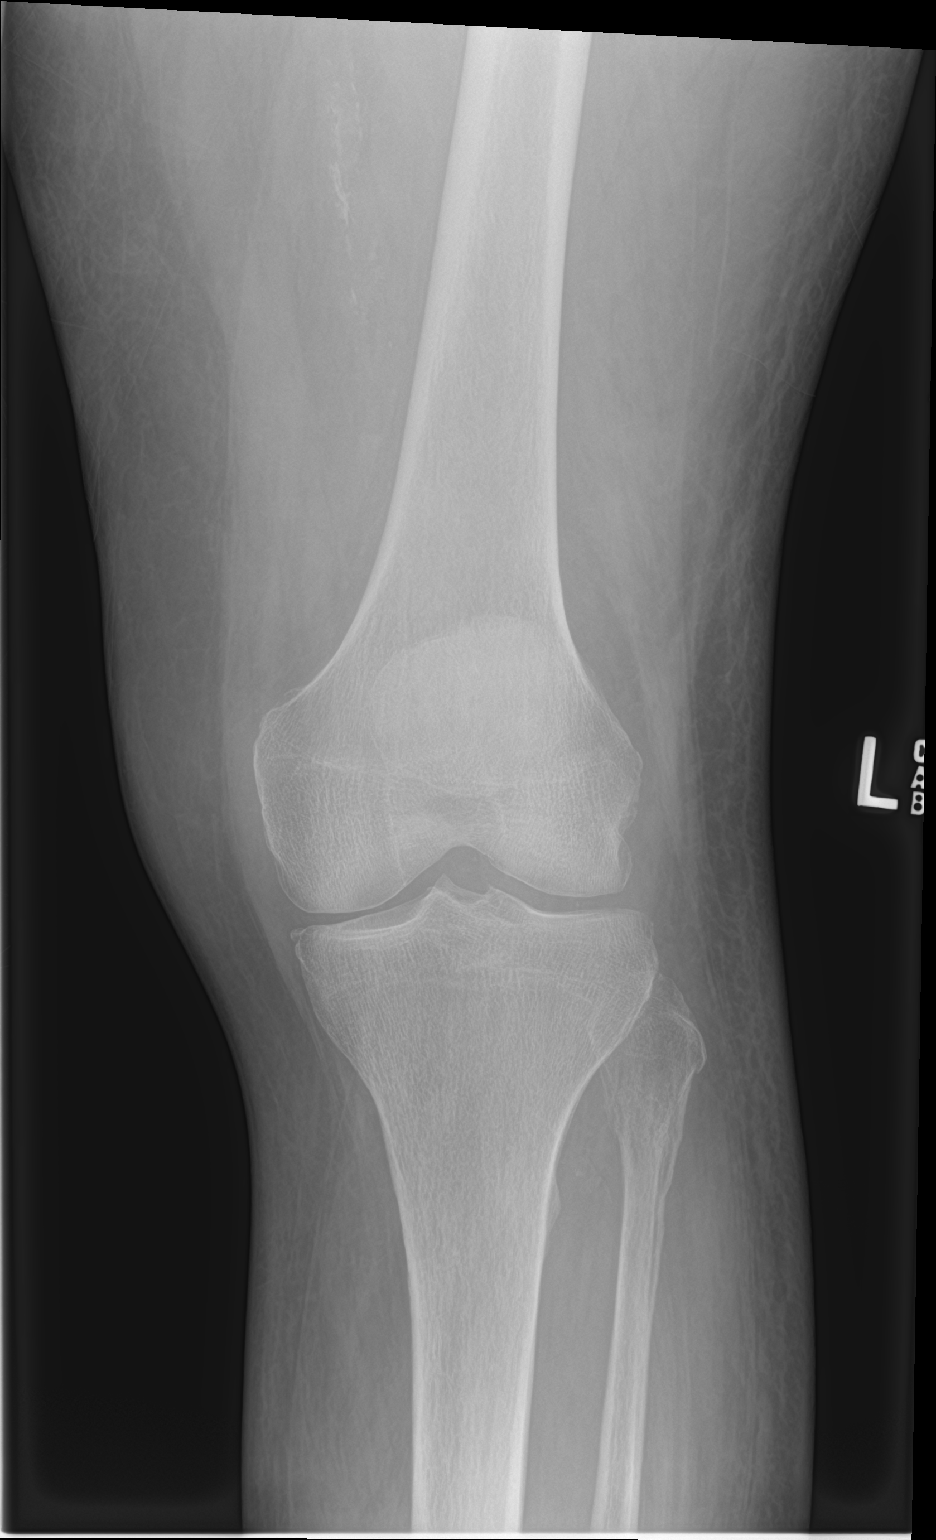
[im 2/4]
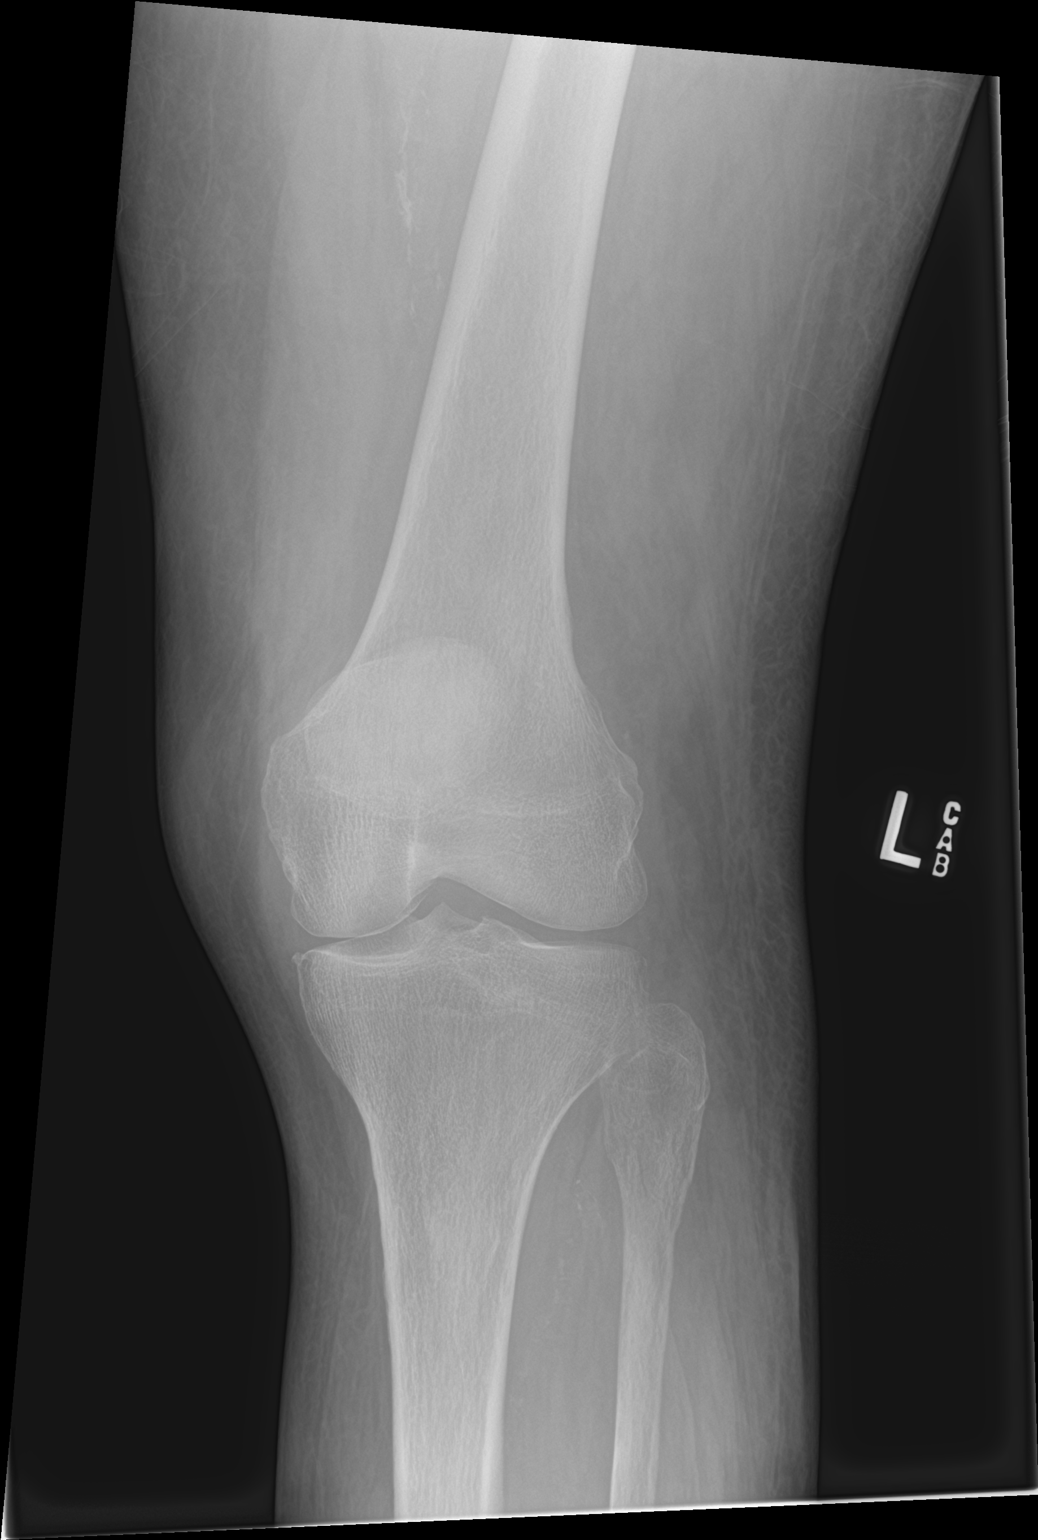
[im 3/4]
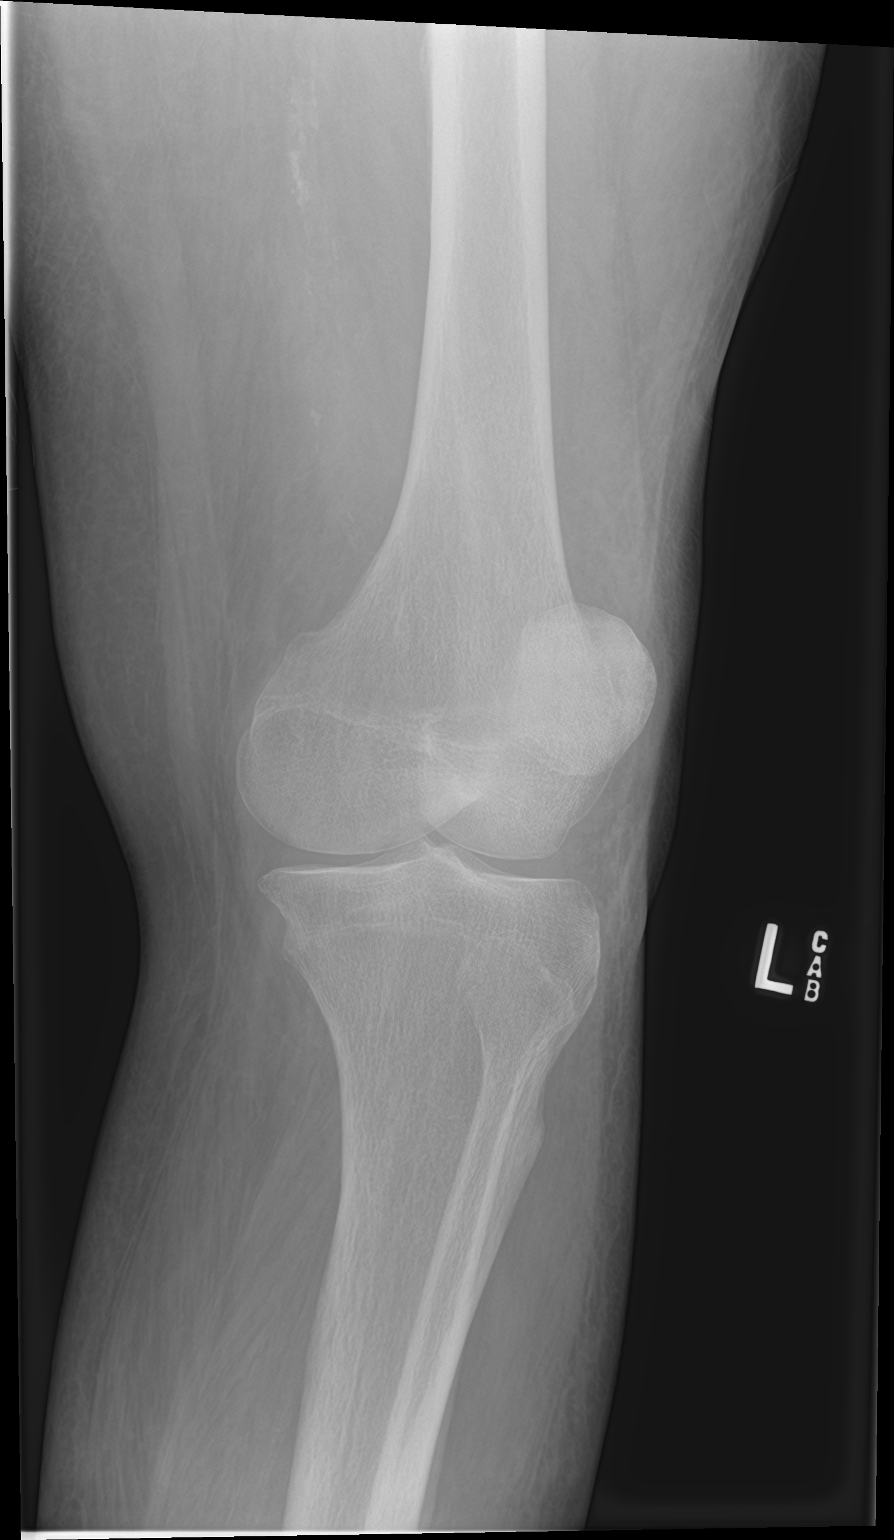
[im 4/4]
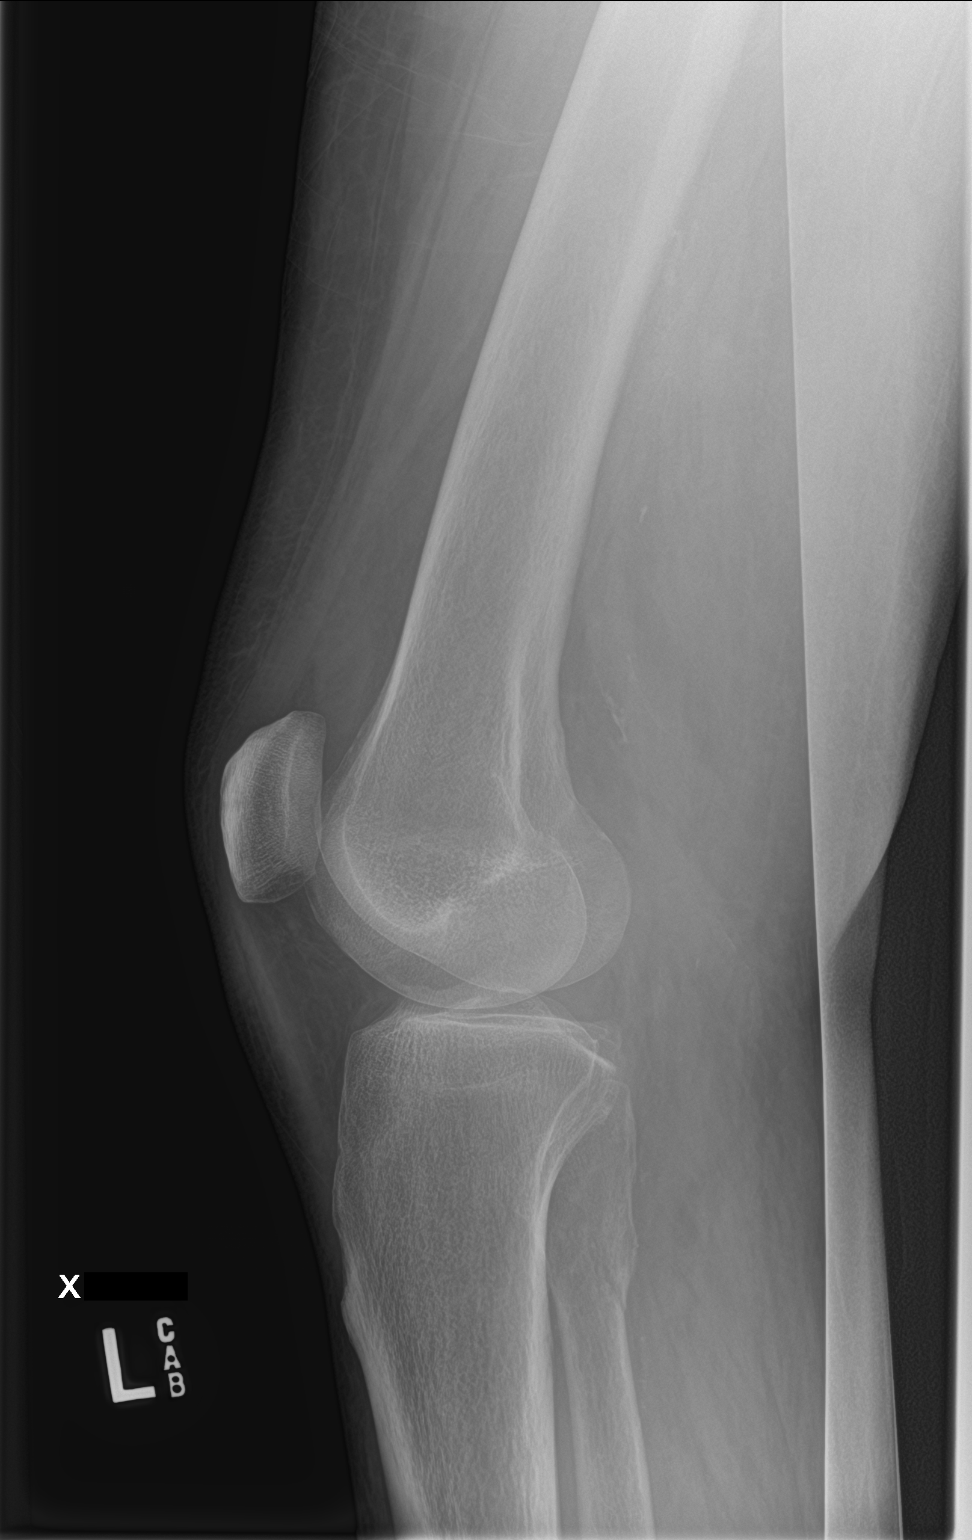

[4 of 4 positions shown; findings below may reference images not displayed]

FINDINGS: There is an apparent fracture through the proximal fibula best seen
on the lateral view. No other fractures. No joint effusion. Minimal
degenerative changes.
IMPRESSION: Nondisplaced proximal fibular fracture.  Mild degenerative changes.

## 2019-10-29 IMAGING — DX DG CHEST 1V PORT
1 series · 1 of 1 positions shown · non-contrast
Comparison: Chest CT 02/23/2018

CLINICAL DATA: Shortness of breath and right-sided chest pain.

EXAM:
PORTABLE CHEST 1 VIEW

[chest ap]
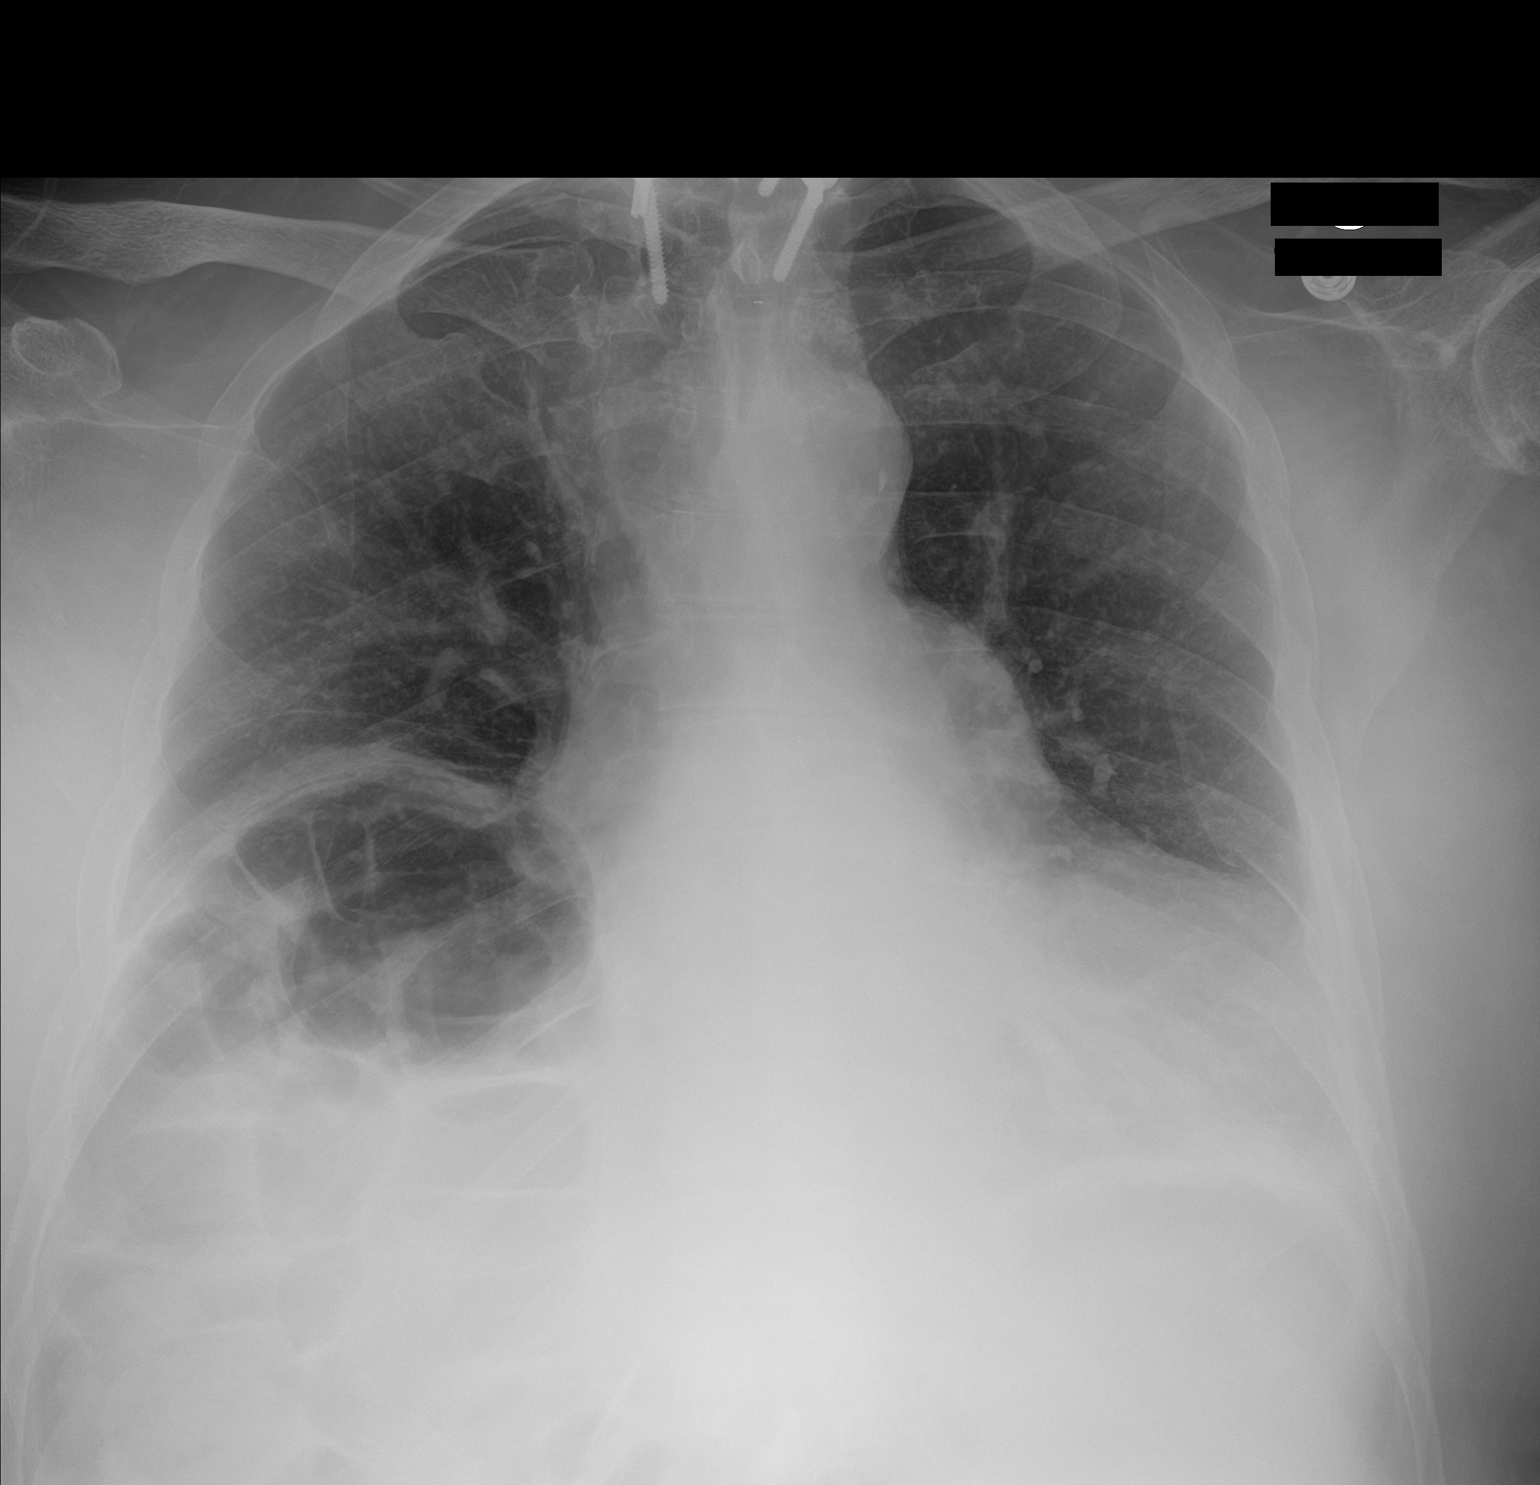

[1 of 1 positions shown; findings below may reference images not displayed]

FINDINGS: Mild right basilar atelectasis with elevation of the right
hemidiaphragm.

Old healed left lateral rib fractures.

Moderate cardiomegaly. Atherosclerotic calcification of the aortic
arch.

Upper thoracic/lower cervical posterolateral rod and facet and
pedicle screw fixator.

Prominence of the main pulmonary artery favoring pulmonary arterial
hypertension.
IMPRESSION: 1. Atelectasis at the right lung base with elevated right
hemidiaphragm. If clinically warranted, fluoroscopic sniff test
could be utilized to assess for diaphragmatic paralysis/dysfunction.
2. Moderate cardiomegaly, without edema.
3.  Aortic Atherosclerosis ([id number]).
4. Prominent main pulmonary artery favoring pulmonary arterial
hypertension.

## 2019-11-21 IMAGING — CR DG FOOT COMPLETE 3+V*L*
3 series · 3 of 3 positions shown · non-contrast
Comparison: 02/23/2018

CLINICAL DATA: Navicular fracture of the left foot. Status post
fall 02/23/2018

EXAM:
LEFT FOOT - COMPLETE 3+ VIEW

[foot ap]
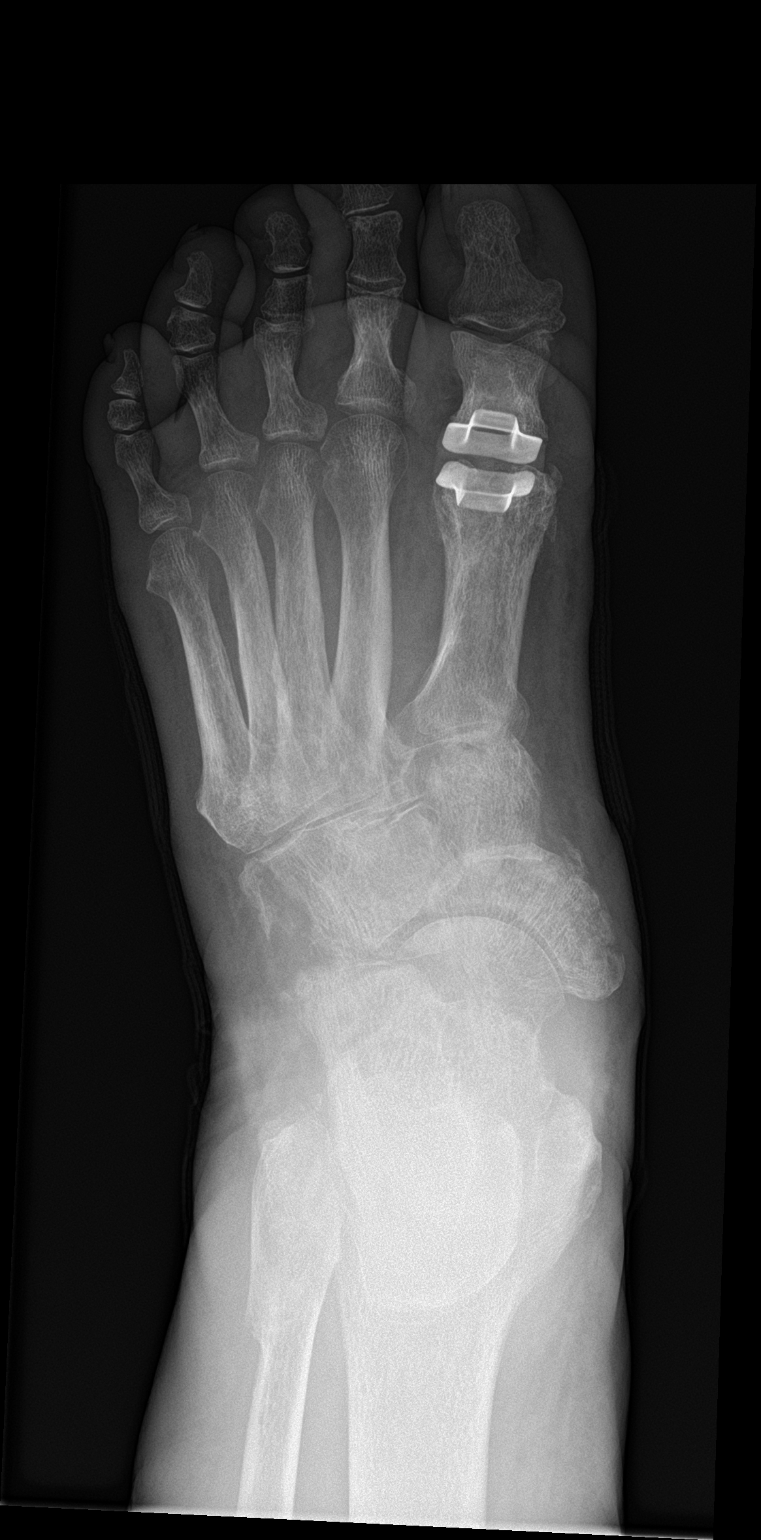

[foot obl]
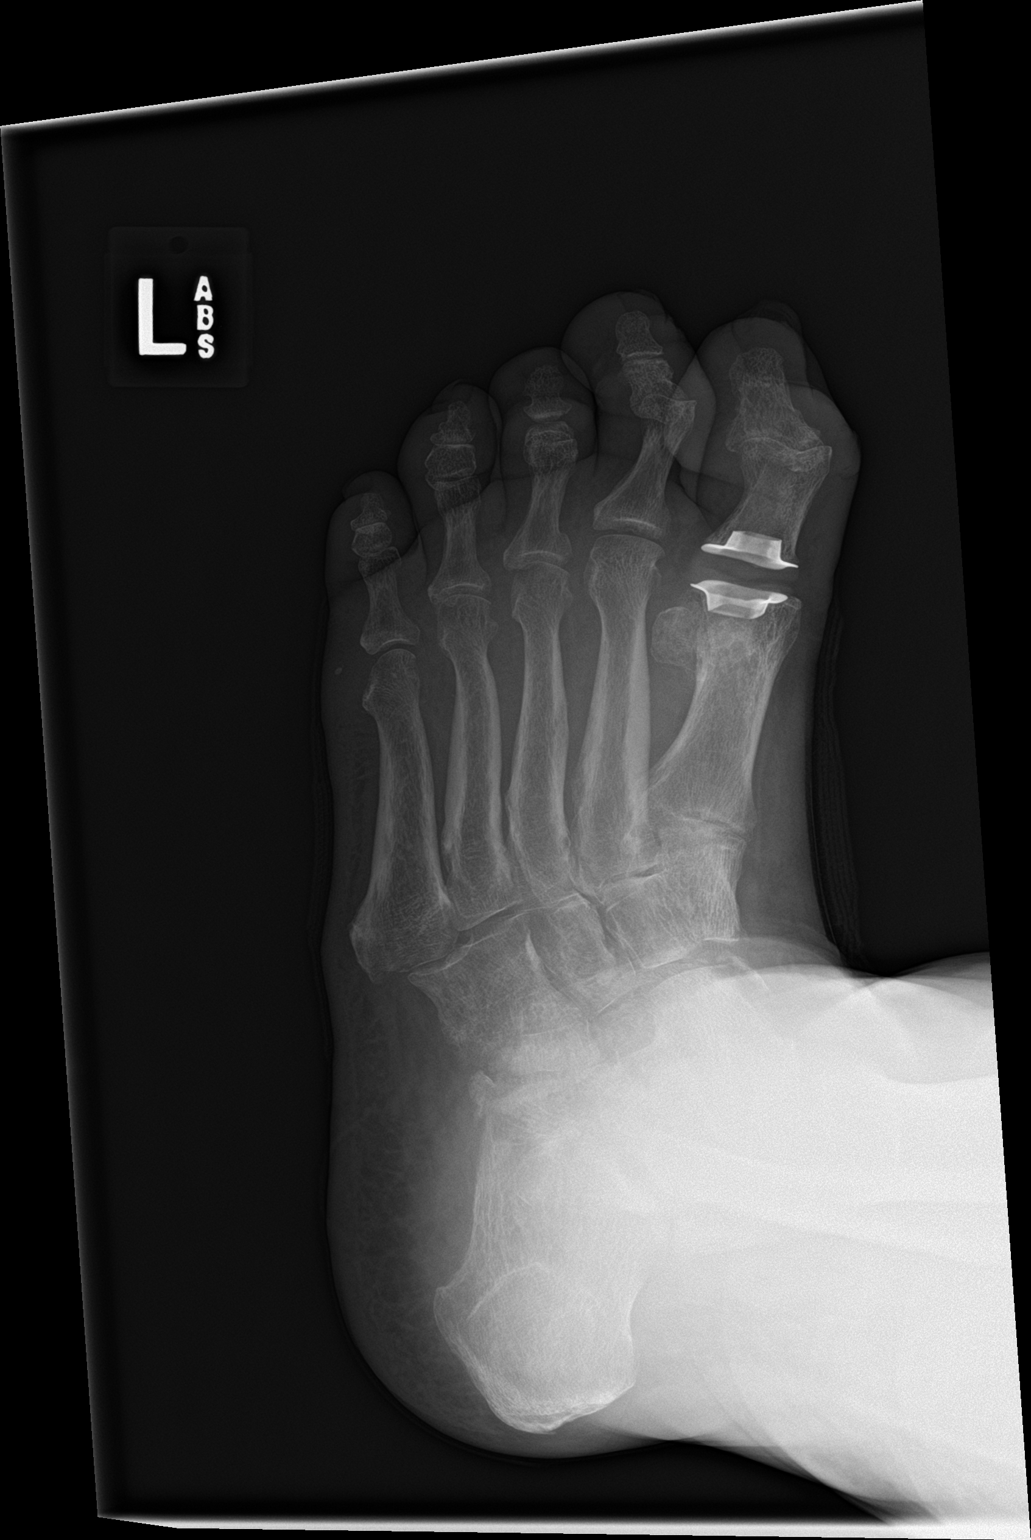

[foot lat]
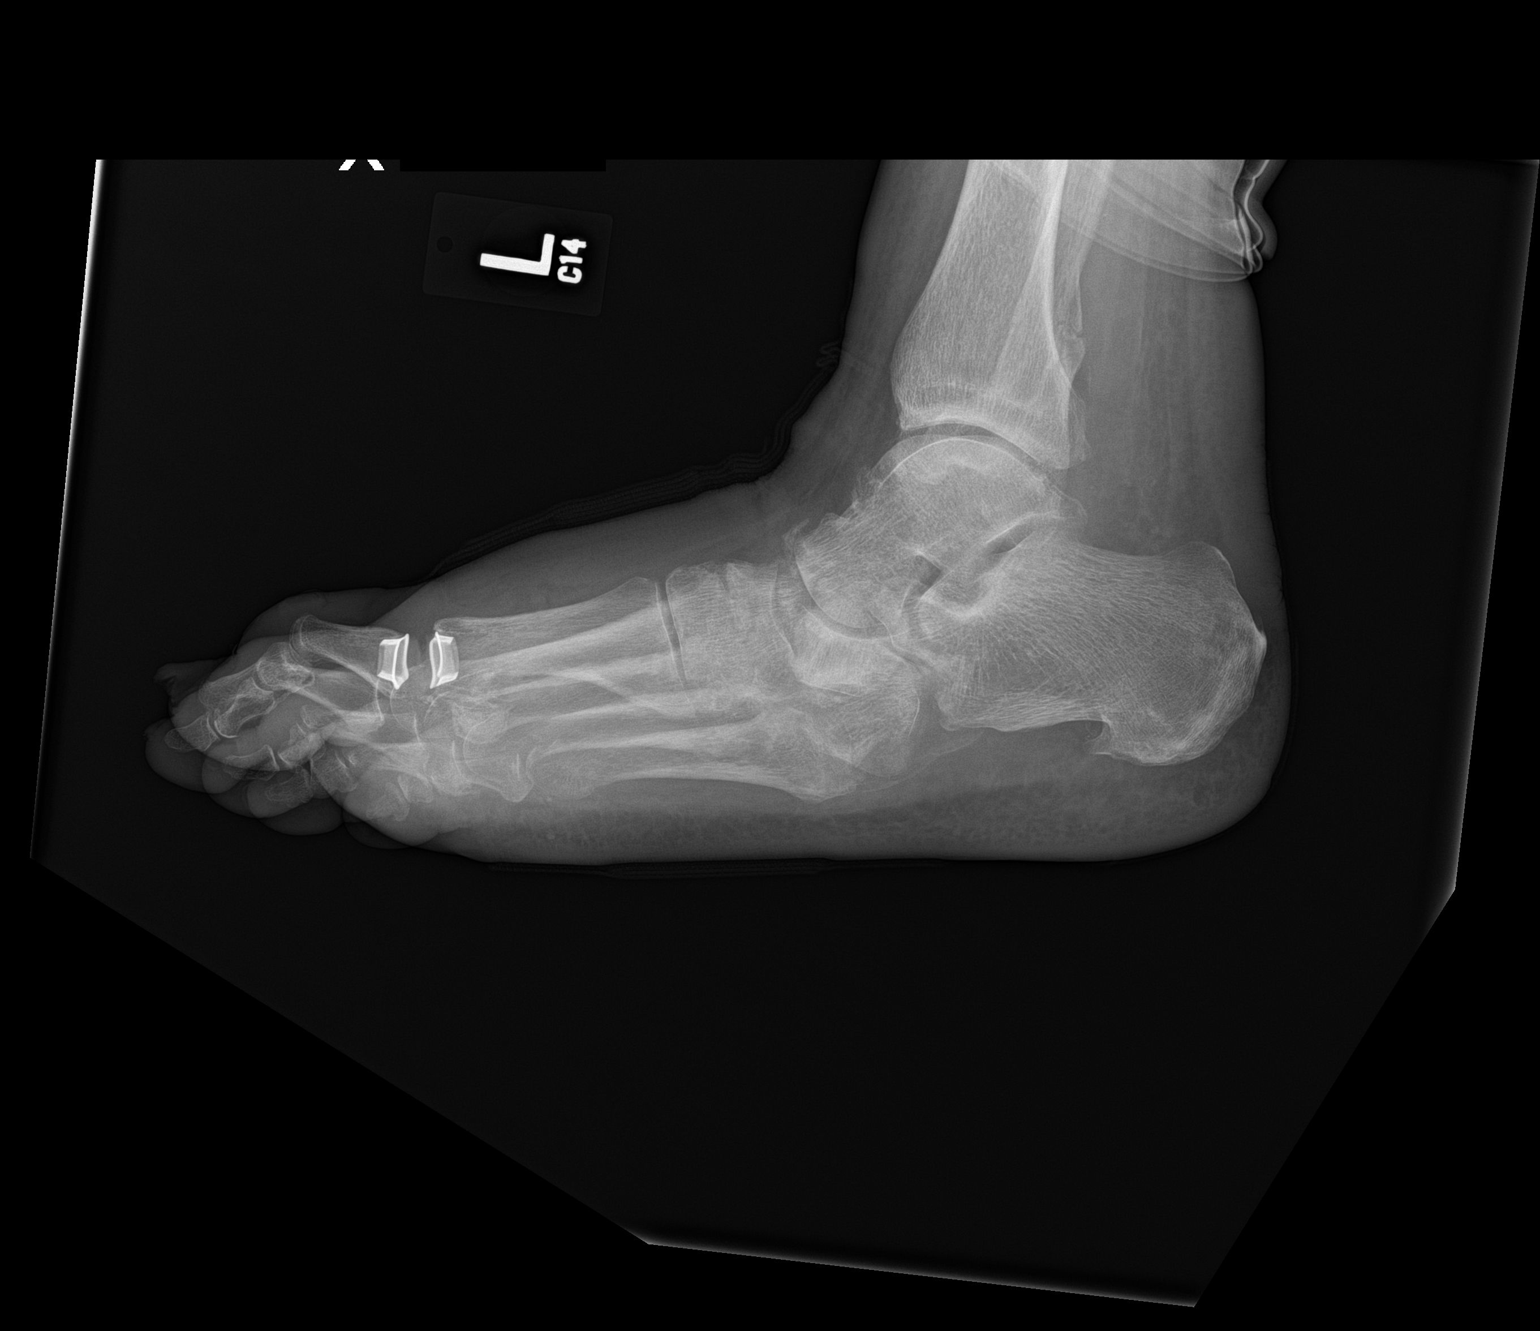

[3 of 3 positions shown; findings below may reference images not displayed]

FINDINGS: Severe osteopenia. Fragmentation of navicular similar in appearance
to the prior examination. Inferior dislocation of the navicular
relative to the anterior talus and medial cuneiform. Fragmentation
of the lateral aspect of the cuboid with plantar subluxation.

No acute fracture or dislocation.  First MTP arthroplasty.
IMPRESSION: 1. Fragmentation of navicular similar in appearance to the prior
examination. Inferior dislocation of the navicular relative to the
anterior talus and medial cuneiform. Fragmentation of the lateral
aspect of the cuboid with plantar subluxation. No interval change.
Findings are concerning for fracture dislocation with developing
Charcot joint.
